# Patient Record
Sex: Male | Born: 1935
Health system: Southern US, Community
[De-identification: ages and names within clinical notes are randomized; demographics above are authoritative.]

## PROBLEM LIST (undated history)

## (undated) DIAGNOSIS — R040 Epistaxis: Secondary | ICD-10-CM

## (undated) DIAGNOSIS — S2239XA Fracture of one rib, unspecified side, initial encounter for closed fracture: Secondary | ICD-10-CM

## (undated) DIAGNOSIS — I712 Thoracic aortic aneurysm, without rupture, unspecified: Secondary | ICD-10-CM

## (undated) DIAGNOSIS — Z8701 Personal history of pneumonia (recurrent): Secondary | ICD-10-CM

## (undated) DIAGNOSIS — E785 Hyperlipidemia, unspecified: Secondary | ICD-10-CM

## (undated) DIAGNOSIS — I4821 Permanent atrial fibrillation: Secondary | ICD-10-CM

## (undated) DIAGNOSIS — R270 Ataxia, unspecified: Secondary | ICD-10-CM

## (undated) DIAGNOSIS — E119 Type 2 diabetes mellitus without complications: Secondary | ICD-10-CM

## (undated) DIAGNOSIS — S2249XA Multiple fractures of ribs, unspecified side, initial encounter for closed fracture: Secondary | ICD-10-CM

## (undated) DIAGNOSIS — I350 Nonrheumatic aortic (valve) stenosis: Secondary | ICD-10-CM

## (undated) DIAGNOSIS — N200 Calculus of kidney: Secondary | ICD-10-CM

## (undated) DIAGNOSIS — N390 Urinary tract infection, site not specified: Secondary | ICD-10-CM

## (undated) DIAGNOSIS — N400 Enlarged prostate without lower urinary tract symptoms: Secondary | ICD-10-CM

## (undated) DIAGNOSIS — I10 Essential (primary) hypertension: Secondary | ICD-10-CM

## (undated) DIAGNOSIS — Z955 Presence of coronary angioplasty implant and graft: Secondary | ICD-10-CM

## (undated) DIAGNOSIS — K219 Gastro-esophageal reflux disease without esophagitis: Secondary | ICD-10-CM

## (undated) DIAGNOSIS — N179 Acute kidney failure, unspecified: Secondary | ICD-10-CM

## (undated) DIAGNOSIS — Z794 Long term (current) use of insulin: Secondary | ICD-10-CM

## (undated) DIAGNOSIS — I2510 Atherosclerotic heart disease of native coronary artery without angina pectoris: Secondary | ICD-10-CM

## (undated) DIAGNOSIS — I5032 Chronic diastolic (congestive) heart failure: Secondary | ICD-10-CM

## (undated) DIAGNOSIS — M1990 Unspecified osteoarthritis, unspecified site: Secondary | ICD-10-CM

## (undated) DIAGNOSIS — I4891 Unspecified atrial fibrillation: Secondary | ICD-10-CM

## (undated) HISTORY — DX: Personal history of pneumonia (recurrent): Z87.01

## (undated) HISTORY — DX: Urinary tract infection, site not specified: N39.0

## (undated) HISTORY — DX: Type 2 diabetes mellitus without complications: E11.9

## (undated) HISTORY — PX: CATARACT EXTRACTION W/ INTRAOCULAR LENS  IMPLANT, BILATERAL: SHX1307

## (undated) HISTORY — DX: Hyperlipidemia, unspecified: E78.5

## (undated) HISTORY — DX: Essential (primary) hypertension: I10

## (undated) HISTORY — DX: Benign prostatic hyperplasia without lower urinary tract symptoms: N40.0

## (undated) HISTORY — DX: Epistaxis: R04.0

## (undated) HISTORY — DX: Fracture of one rib, unspecified side, initial encounter for closed fracture: S22.39XA

## (undated) HISTORY — DX: Acute kidney failure, unspecified: N17.9

## (undated) HISTORY — DX: Unspecified osteoarthritis, unspecified site: M19.90

## (undated) HISTORY — DX: Nonrheumatic aortic (valve) stenosis: I35.0

## (undated) HISTORY — DX: Presence of coronary angioplasty implant and graft: Z95.5

## (undated) HISTORY — DX: Unspecified atrial fibrillation: I48.91

## (undated) HISTORY — DX: Multiple fractures of ribs, unspecified side, initial encounter for closed fracture: S22.49XA

## (undated) HISTORY — DX: Atherosclerotic heart disease of native coronary artery without angina pectoris: I25.10

## (undated) HISTORY — PX: TONSILLECTOMY: SUR1361

## (undated) HISTORY — DX: Calculus of kidney: N20.0

## (undated) HISTORY — PX: JOINT REPLACEMENT: SHX530

## (undated) HISTORY — DX: Long term (current) use of insulin: Z79.4

---

## 1987-03-02 HISTORY — PX: CORONARY ARTERY BYPASS GRAFT: SHX141

## 1987-03-02 HISTORY — PX: CARDIAC CATHETERIZATION: SHX172

## 1998-03-01 HISTORY — PX: TOTAL HIP ARTHROPLASTY: SHX124

## 2004-12-04 ENCOUNTER — Encounter: Admission: RE | Admit: 2004-12-04 | Discharge: 2004-12-04 | Payer: Self-pay | Admitting: Cardiology

## 2008-05-11 ENCOUNTER — Emergency Department (HOSPITAL_COMMUNITY): Admission: EM | Admit: 2008-05-11 | Discharge: 2008-05-11 | Payer: Self-pay | Admitting: Emergency Medicine

## 2008-05-18 ENCOUNTER — Emergency Department (HOSPITAL_COMMUNITY): Admission: EM | Admit: 2008-05-18 | Discharge: 2008-05-19 | Payer: Self-pay | Admitting: Emergency Medicine

## 2008-08-27 DIAGNOSIS — N029 Recurrent and persistent hematuria with unspecified morphologic changes: Secondary | ICD-10-CM | POA: Insufficient documentation

## 2008-08-27 DIAGNOSIS — R319 Hematuria, unspecified: Secondary | ICD-10-CM | POA: Insufficient documentation

## 2009-06-05 ENCOUNTER — Emergency Department (HOSPITAL_COMMUNITY): Admission: EM | Admit: 2009-06-05 | Discharge: 2009-06-05 | Payer: Self-pay | Admitting: Emergency Medicine

## 2009-10-31 ENCOUNTER — Emergency Department (HOSPITAL_COMMUNITY): Admission: EM | Admit: 2009-10-31 | Discharge: 2009-10-31 | Payer: Self-pay | Admitting: Emergency Medicine

## 2009-11-02 ENCOUNTER — Inpatient Hospital Stay (HOSPITAL_COMMUNITY): Admission: EM | Admit: 2009-11-02 | Discharge: 2009-11-05 | Payer: Self-pay | Admitting: Emergency Medicine

## 2009-11-06 ENCOUNTER — Emergency Department (HOSPITAL_COMMUNITY): Admission: EM | Admit: 2009-11-06 | Discharge: 2009-11-06 | Payer: Self-pay | Admitting: Emergency Medicine

## 2009-11-07 ENCOUNTER — Inpatient Hospital Stay (HOSPITAL_COMMUNITY): Admission: EM | Admit: 2009-11-07 | Discharge: 2009-11-10 | Payer: Self-pay | Admitting: Emergency Medicine

## 2009-11-08 ENCOUNTER — Ambulatory Visit: Payer: Self-pay | Admitting: Vascular Surgery

## 2010-01-15 DIAGNOSIS — E119 Type 2 diabetes mellitus without complications: Secondary | ICD-10-CM | POA: Insufficient documentation

## 2010-01-15 DIAGNOSIS — I2510 Atherosclerotic heart disease of native coronary artery without angina pectoris: Secondary | ICD-10-CM | POA: Insufficient documentation

## 2010-04-21 ENCOUNTER — Telehealth (INDEPENDENT_AMBULATORY_CARE_PROVIDER_SITE_OTHER): Payer: Self-pay | Admitting: *Deleted

## 2010-04-22 ENCOUNTER — Ambulatory Visit (HOSPITAL_COMMUNITY): Payer: Medicare Other | Attending: Cardiology

## 2010-04-22 ENCOUNTER — Encounter: Payer: Self-pay | Admitting: Cardiovascular Disease

## 2010-04-22 DIAGNOSIS — R079 Chest pain, unspecified: Secondary | ICD-10-CM | POA: Insufficient documentation

## 2010-04-22 DIAGNOSIS — R0602 Shortness of breath: Secondary | ICD-10-CM

## 2010-04-22 DIAGNOSIS — I2510 Atherosclerotic heart disease of native coronary artery without angina pectoris: Secondary | ICD-10-CM

## 2010-04-28 NOTE — Assessment & Plan Note (Signed)
Summary: Cardiology Nuclear Testing  Nuclear Med Background Indications for Stress Test: Evaluation for Ischemia, Graft Patency   History: CABG, Echo, Heart Catheterization, Myocardial Perfusion Study  History Comments: '90 CABG x2 '03 Heart Cath 10/08 MPS: EF=61% 5/10 Echo: mild AS  Symptoms: DOE, Fatigue with Exertion    Nuclear Pre-Procedure Cardiac Risk Factors: History of Smoking, Hypertension, IDDM Type 2, Lipids Caffeine/Decaff Intake: None NPO After: 7:00 PM Lungs: clear IV 0.9% NS with Angio Cath: 22g     IV Site: R Hand IV Started by: Bonnita Levan, RN Chest Size (in) 50     Height (in): 66 Weight (lb): 225 BMI: 36.45  Nuclear Med Study 1 or 2 day study:  1 day     Stress Test Type:  Treadmill/Lexiscan Reading MD:  Marca Ancona, MD     Referring MD:  P.Jordan Resting Radionuclide:  Technetium 75m Tetrofosmin     Resting Radionuclide Dose:  11 mCi  Stress Radionuclide:  Technetium 78m Tetrofosmin     Stress Radionuclide Dose:  33 mCi   Stress Protocol Exercise Time (min):  2:00 min     Max HR:  110 bpm Max Systolic BP: 179 mm HgRate Pressure Product:  13086  Lexiscan: 0.4 mg   Stress Test Technologist:  Frederick Peers, EMT-P     Nuclear Technologist:  Doyne Keel, CNMT  Rest Procedure  Myocardial perfusion imaging was performed at rest 45 minutes following the intravenous administration of Technetium 57m Tetrofosmin.  Stress Procedure  The patient received IV Lexiscan 0.4 mg over 15-seconds with concurrent low level exercise and then Technetium 67m Tetrofosmin was injected at 30-seconds while the patient continued walking one more minute.  There were no significant changes with Lexiscan.  Quantitative spect images were obtained after a 45 minute delay.  QPS Raw Data Images:  Normal; no motion artifact; normal heart/lung ratio. Stress Images:  Normal homogeneous uptake in all areas of the myocardium. Rest Images:  Normal homogeneous uptake in all areas of  the myocardium. Subtraction (SDS):  Normal SDS 1 Transient Ischemic Dilatation:  0.88  (Normal <1.22)  Lung/Heart Ratio:  0.32  (Normal <0.45)  Quantitative Gated Spect Images QGS EDV:  107 ml QGS ESV:  39 ml QGS EF:  64 % QGS cine images:  normal  Findings Normal nuclear study      Overall Impression  Exercise Capacity: Lexiscan with no exercise. BP Response: Normal blood pressure response. Clinical Symptoms: Dyspnea ECG Impression: Baseline nonspecific inferolateral T wave changes Overall Impression: Normal stress nuclear study.  Appended Document: Cardiology Nuclear Testing copy sent to Dr. [place]

## 2010-04-28 NOTE — Progress Notes (Signed)
Summary: Nuclear Pre-Procedure  Phone Note Outgoing Call Call back at [facility] Phone [phone]   Call placed by: Stanton Kidney, EMT-P,  April 21, 2010 1:25 PM Action Taken: Phone Call Completed Summary of Call: Left message with information on Myoview Information Sheet (see scanned document for details). Stanton Kidney, EMT-P  April 21, 2010 1:25 PM      Nuclear Med Background Indications for Stress Test: Evaluation for Ischemia, Graft Patency   History: CABG, Echo, Heart Catheterization, Myocardial Perfusion Study  History Comments: '90 CABG x2 '03 Heart Cath 10/08 MPS: EF=61% 5/10 Echo: mild AS     Nuclear Pre-Procedure Cardiac Risk Factors: History of Smoking, Hypertension, IDDM Type 2, Lipids

## 2010-05-11 ENCOUNTER — Encounter: Payer: Self-pay | Admitting: Cardiology

## 2010-05-11 DIAGNOSIS — E1169 Type 2 diabetes mellitus with other specified complication: Secondary | ICD-10-CM | POA: Insufficient documentation

## 2010-05-11 DIAGNOSIS — E1121 Type 2 diabetes mellitus with diabetic nephropathy: Secondary | ICD-10-CM | POA: Insufficient documentation

## 2010-05-11 DIAGNOSIS — E782 Mixed hyperlipidemia: Secondary | ICD-10-CM | POA: Insufficient documentation

## 2010-05-11 DIAGNOSIS — E785 Hyperlipidemia, unspecified: Secondary | ICD-10-CM | POA: Insufficient documentation

## 2010-05-11 DIAGNOSIS — I10 Essential (primary) hypertension: Secondary | ICD-10-CM | POA: Insufficient documentation

## 2010-05-11 DIAGNOSIS — Z951 Presence of aortocoronary bypass graft: Secondary | ICD-10-CM | POA: Insufficient documentation

## 2010-05-11 DIAGNOSIS — I152 Hypertension secondary to endocrine disorders: Secondary | ICD-10-CM | POA: Insufficient documentation

## 2010-05-11 DIAGNOSIS — E119 Type 2 diabetes mellitus without complications: Secondary | ICD-10-CM | POA: Insufficient documentation

## 2010-05-14 LAB — URINE CULTURE
Colony Count: >=100000
Colony Count: NO GROWTH
Colony Count: NO GROWTH
Culture  Setup Time: 201109051316
Culture  Setup Time: 201109090836
Culture  Setup Time: 201109091847
Culture: NO GROWTH
Culture: NO GROWTH
Special Requests: NEGATIVE
Special Requests: NEGATIVE

## 2010-05-14 LAB — COMPREHENSIVE METABOLIC PANEL
ALT: 24 U/L (ref 0–53)
ALT: 25 U/L (ref 0–53)
AST: 20 U/L (ref 0–37)
AST: 29 U/L (ref 0–37)
Albumin: 2.9 g/dL — ABNORMAL LOW (ref 3.5–5.2)
Albumin: 3.7 g/dL (ref 3.5–5.2)
Alkaline Phosphatase: 59 U/L (ref 39–117)
Alkaline Phosphatase: 78 U/L (ref 39–117)
BUN: 21 mg/dL (ref 6–23)
BUN: 48 mg/dL — ABNORMAL HIGH (ref 6–23)
CO2: 22 mEq/L (ref 19–32)
CO2: 28 mEq/L (ref 19–32)
Calcium: 8.5 mg/dL (ref 8.4–10.5)
Calcium: 8.9 mg/dL (ref 8.4–10.5)
Chloride: 103 mEq/L (ref 96–112)
Chloride: 90 mEq/L — ABNORMAL LOW (ref 96–112)
Creatinine, Ser: 0.96 mg/dL (ref 0.4–1.5)
Creatinine, Ser: 2.57 mg/dL — ABNORMAL HIGH (ref 0.4–1.5)
GFR calc Af Amer: 29 mL/min — ABNORMAL LOW (ref >60)
GFR calc Af Amer: >60 mL/min (ref >60)
GFR calc non Af Amer: 24 mL/min — ABNORMAL LOW (ref >60)
GFR calc non Af Amer: >60 mL/min (ref >60)
Glucose, Bld: 252 mg/dL — ABNORMAL HIGH (ref 70–99)
Glucose, Bld: 354 mg/dL — ABNORMAL HIGH (ref 70–99)
Potassium: 4.4 mEq/L (ref 3.5–5.1)
Potassium: 4.4 mEq/L (ref 3.5–5.1)
Sodium: 125 mEq/L — ABNORMAL LOW (ref 135–145)
Sodium: 137 mEq/L (ref 135–145)
Total Bilirubin: 1 mg/dL (ref 0.3–1.2)
Total Bilirubin: 1.7 mg/dL — ABNORMAL HIGH (ref 0.3–1.2)
Total Protein: 6.1 g/dL (ref 6.0–8.3)
Total Protein: 7.4 g/dL (ref 6.0–8.3)

## 2010-05-14 LAB — BASIC METABOLIC PANEL
BUN: 47 mg/dL — ABNORMAL HIGH (ref 6–23)
BUN: 8 mg/dL (ref 6–23)
CO2: 24 mEq/L (ref 19–32)
CO2: 26 mEq/L (ref 19–32)
Calcium: 8.6 mg/dL (ref 8.4–10.5)
Calcium: 8.8 mg/dL (ref 8.4–10.5)
Chloride: 107 mEq/L (ref 96–112)
Chloride: 94 mEq/L — ABNORMAL LOW (ref 96–112)
Creatinine, Ser: 0.76 mg/dL (ref 0.4–1.5)
Creatinine, Ser: 2.13 mg/dL — ABNORMAL HIGH (ref 0.4–1.5)
GFR calc Af Amer: 36 mL/min — ABNORMAL LOW (ref >60)
GFR calc Af Amer: >60 mL/min (ref >60)
GFR calc non Af Amer: 30 mL/min — ABNORMAL LOW (ref >60)
GFR calc non Af Amer: >60 mL/min (ref >60)
Glucose, Bld: 135 mg/dL — ABNORMAL HIGH (ref 70–99)
Glucose, Bld: 261 mg/dL — ABNORMAL HIGH (ref 70–99)
Potassium: 3.8 mEq/L (ref 3.5–5.1)
Potassium: 4.3 mEq/L (ref 3.5–5.1)
Sodium: 130 mEq/L — ABNORMAL LOW (ref 135–145)
Sodium: 141 mEq/L (ref 135–145)

## 2010-05-14 LAB — GLUCOSE, CAPILLARY
Glucose-Capillary: 125 mg/dL — ABNORMAL HIGH (ref 70–99)
Glucose-Capillary: 125 mg/dL — ABNORMAL HIGH (ref 70–99)
Glucose-Capillary: 173 mg/dL — ABNORMAL HIGH (ref 70–99)
Glucose-Capillary: 174 mg/dL — ABNORMAL HIGH (ref 70–99)
Glucose-Capillary: 196 mg/dL — ABNORMAL HIGH (ref 70–99)
Glucose-Capillary: 204 mg/dL — ABNORMAL HIGH (ref 70–99)
Glucose-Capillary: 216 mg/dL — ABNORMAL HIGH (ref 70–99)
Glucose-Capillary: 217 mg/dL — ABNORMAL HIGH (ref 70–99)
Glucose-Capillary: 220 mg/dL — ABNORMAL HIGH (ref 70–99)
Glucose-Capillary: 225 mg/dL — ABNORMAL HIGH (ref 70–99)
Glucose-Capillary: 233 mg/dL — ABNORMAL HIGH (ref 70–99)
Glucose-Capillary: 234 mg/dL — ABNORMAL HIGH (ref 70–99)
Glucose-Capillary: 237 mg/dL — ABNORMAL HIGH (ref 70–99)
Glucose-Capillary: 237 mg/dL — ABNORMAL HIGH (ref 70–99)
Glucose-Capillary: 241 mg/dL — ABNORMAL HIGH (ref 70–99)
Glucose-Capillary: 245 mg/dL — ABNORMAL HIGH (ref 70–99)
Glucose-Capillary: 246 mg/dL — ABNORMAL HIGH (ref 70–99)
Glucose-Capillary: 248 mg/dL — ABNORMAL HIGH (ref 70–99)
Glucose-Capillary: 249 mg/dL — ABNORMAL HIGH (ref 70–99)
Glucose-Capillary: 253 mg/dL — ABNORMAL HIGH (ref 70–99)
Glucose-Capillary: 257 mg/dL — ABNORMAL HIGH (ref 70–99)
Glucose-Capillary: 264 mg/dL — ABNORMAL HIGH (ref 70–99)
Glucose-Capillary: 276 mg/dL — ABNORMAL HIGH (ref 70–99)
Glucose-Capillary: 277 mg/dL — ABNORMAL HIGH (ref 70–99)
Glucose-Capillary: 283 mg/dL — ABNORMAL HIGH (ref 70–99)
Glucose-Capillary: 293 mg/dL — ABNORMAL HIGH (ref 70–99)
Glucose-Capillary: 304 mg/dL — ABNORMAL HIGH (ref 70–99)
Glucose-Capillary: 329 mg/dL — ABNORMAL HIGH (ref 70–99)
Glucose-Capillary: 372 mg/dL — ABNORMAL HIGH (ref 70–99)
Glucose-Capillary: 377 mg/dL — ABNORMAL HIGH (ref 70–99)
Glucose-Capillary: 425 mg/dL — ABNORMAL HIGH (ref 70–99)

## 2010-05-14 LAB — CARDIAC PANEL(CRET KIN+CKTOT+MB+TROPI)
CK, MB: 1.1 ng/mL (ref 0.3–4.0)
CK, MB: 1.1 ng/mL (ref 0.3–4.0)
Relative Index: INVALID (ref 0.0–2.5)
Relative Index: INVALID (ref 0.0–2.5)
Total CK: 73 U/L (ref 7–232)
Total CK: 75 U/L (ref 7–232)
Troponin I: 0.02 ng/mL (ref 0.00–0.06)
Troponin I: 0.03 ng/mL (ref 0.00–0.06)

## 2010-05-14 LAB — URINALYSIS, ROUTINE W REFLEX MICROSCOPIC
Bilirubin Urine: NEGATIVE
Bilirubin Urine: NEGATIVE
Glucose, UA: 250 mg/dL — AB
Glucose, UA: NEGATIVE mg/dL
Hgb urine dipstick: NEGATIVE
Ketones, ur: NEGATIVE mg/dL
Ketones, ur: NEGATIVE mg/dL
Nitrite: NEGATIVE
Nitrite: NEGATIVE
Protein, ur: 100 mg/dL — AB
Protein, ur: NEGATIVE mg/dL
Specific Gravity, Urine: 1.01 (ref 1.005–1.030)
Specific Gravity, Urine: 1.018 (ref 1.005–1.030)
Urobilinogen, UA: 0.2 mg/dL (ref 0.0–1.0)
Urobilinogen, UA: 0.2 mg/dL (ref 0.0–1.0)
pH: 5.5 (ref 5.0–8.0)
pH: 5.5 (ref 5.0–8.0)

## 2010-05-14 LAB — URINALYSIS, MICROSCOPIC ONLY
Bilirubin Urine: NEGATIVE
Glucose, UA: >1000 mg/dL — AB
Ketones, ur: NEGATIVE mg/dL
Leukocytes, UA: NEGATIVE
Nitrite: NEGATIVE
Protein, ur: NEGATIVE mg/dL
Specific Gravity, Urine: 1.045 — ABNORMAL HIGH (ref 1.005–1.030)
Urobilinogen, UA: 0.2 mg/dL (ref 0.0–1.0)
pH: 7 (ref 5.0–8.0)

## 2010-05-14 LAB — RETICULOCYTES
RBC.: 3.76 MIL/uL — ABNORMAL LOW (ref 4.22–5.81)
Retic Count, Absolute: 30.1 10*3/uL (ref 19.0–186.0)
Retic Ct Pct: 0.8 % (ref 0.4–3.1)

## 2010-05-14 LAB — CBC
HCT: 31.1 % — ABNORMAL LOW (ref 39.0–52.0)
HCT: 31.3 % — ABNORMAL LOW (ref 39.0–52.0)
HCT: 32.5 % — ABNORMAL LOW (ref 39.0–52.0)
HCT: 35.5 % — ABNORMAL LOW (ref 39.0–52.0)
Hemoglobin: 10.8 g/dL — ABNORMAL LOW (ref 13.0–17.0)
Hemoglobin: 10.9 g/dL — ABNORMAL LOW (ref 13.0–17.0)
Hemoglobin: 11.1 g/dL — ABNORMAL LOW (ref 13.0–17.0)
Hemoglobin: 12.7 g/dL — ABNORMAL LOW (ref 13.0–17.0)
MCH: 29 pg (ref 26.0–34.0)
MCH: 29.1 pg (ref 26.0–34.0)
MCH: 29.2 pg (ref 26.0–34.0)
MCH: 29.5 pg (ref 26.0–34.0)
MCHC: 34.2 g/dL (ref 30.0–36.0)
MCHC: 34.5 g/dL (ref 30.0–36.0)
MCHC: 35 g/dL (ref 30.0–36.0)
MCHC: 35.8 g/dL (ref 30.0–36.0)
MCV: 81.6 fL (ref 78.0–100.0)
MCV: 82.7 fL (ref 78.0–100.0)
MCV: 85.1 fL (ref 78.0–100.0)
MCV: 85.5 fL (ref 78.0–100.0)
Platelets: 228 10*3/uL (ref 150–400)
Platelets: 237 10*3/uL (ref 150–400)
Platelets: 238 10*3/uL (ref 150–400)
Platelets: 277 10*3/uL (ref 150–400)
RBC: 3.66 MIL/uL — ABNORMAL LOW (ref 4.22–5.81)
RBC: 3.76 MIL/uL — ABNORMAL LOW (ref 4.22–5.81)
RBC: 3.82 MIL/uL — ABNORMAL LOW (ref 4.22–5.81)
RBC: 4.35 MIL/uL (ref 4.22–5.81)
RDW: 12.9 % (ref 11.5–15.5)
RDW: 13 % (ref 11.5–15.5)
RDW: 13.1 % (ref 11.5–15.5)
RDW: 13.2 % (ref 11.5–15.5)
WBC: 10.7 10*3/uL — ABNORMAL HIGH (ref 4.0–10.5)
WBC: 11.5 10*3/uL — ABNORMAL HIGH (ref 4.0–10.5)
WBC: 14.1 10*3/uL — ABNORMAL HIGH (ref 4.0–10.5)
WBC: 17.4 10*3/uL — ABNORMAL HIGH (ref 4.0–10.5)

## 2010-05-14 LAB — POCT CARDIAC MARKERS
CKMB, poc: <1 ng/mL — ABNORMAL LOW (ref 1.0–8.0)
Myoglobin, poc: 70.9 ng/mL (ref 12–200)
Troponin i, poc: <0.05 ng/mL (ref 0.00–0.09)

## 2010-05-14 LAB — DIFFERENTIAL
Basophils Absolute: 0 10*3/uL (ref 0.0–0.1)
Basophils Relative: 0 % (ref 0–1)
Eosinophils Absolute: 0 10*3/uL (ref 0.0–0.7)
Eosinophils Relative: 0 % (ref 0–5)
Lymphocytes Relative: 5 % — ABNORMAL LOW (ref 12–46)
Lymphs Abs: 0.9 10*3/uL (ref 0.7–4.0)
Monocytes Absolute: 1.1 10*3/uL — ABNORMAL HIGH (ref 0.1–1.0)
Monocytes Relative: 6 % (ref 3–12)
Neutro Abs: 15.4 10*3/uL — ABNORMAL HIGH (ref 1.7–7.7)
Neutrophils Relative %: 88 % — ABNORMAL HIGH (ref 43–77)

## 2010-05-14 LAB — HEMOGLOBIN A1C
Hgb A1c MFr Bld: 7.2 % — ABNORMAL HIGH (ref <5.7)
Mean Plasma Glucose: 160 mg/dL — ABNORMAL HIGH (ref <117)

## 2010-05-14 LAB — POCT I-STAT, CHEM 8
BUN: 12 mg/dL (ref 6–23)
Calcium, Ion: 1.16 mmol/L (ref 1.12–1.32)
Chloride: 101 mEq/L (ref 96–112)
Creatinine, Ser: 0.9 mg/dL (ref 0.4–1.5)
Glucose, Bld: 226 mg/dL — ABNORMAL HIGH (ref 70–99)
HCT: 43 % (ref 39.0–52.0)
Hemoglobin: 14.6 g/dL (ref 13.0–17.0)
Potassium: 4.2 mEq/L (ref 3.5–5.1)
Sodium: 138 mEq/L (ref 135–145)
TCO2: 28 mmol/L (ref 0–100)

## 2010-05-14 LAB — CK TOTAL AND CKMB (NOT AT ARMC)
CK, MB: 1.1 ng/mL (ref 0.3–4.0)
Relative Index: INVALID (ref 0.0–2.5)
Total CK: 72 U/L (ref 7–232)

## 2010-05-14 LAB — IRON AND TIBC
Iron: 25 ug/dL — ABNORMAL LOW (ref 42–135)
Saturation Ratios: 12 % — ABNORMAL LOW (ref 20–55)
TIBC: 216 ug/dL (ref 215–435)
UIBC: 191 ug/dL

## 2010-05-14 LAB — VITAMIN B12: Vitamin B-12: 337 pg/mL (ref 211–911)

## 2010-05-14 LAB — LIPASE, BLOOD: Lipase: 21 U/L (ref 11–59)

## 2010-05-14 LAB — MAGNESIUM: Magnesium: 2.2 mg/dL (ref 1.5–2.5)

## 2010-05-14 LAB — URINE MICROSCOPIC-ADD ON

## 2010-05-14 LAB — PSA: PSA: 106.83 ng/mL — ABNORMAL HIGH (ref 0.10–4.00)

## 2010-05-14 LAB — TROPONIN I: Troponin I: 0.01 ng/mL (ref 0.00–0.06)

## 2010-05-14 LAB — FOLATE: Folate: 16.4 ng/mL

## 2010-05-14 LAB — FERRITIN: Ferritin: 369 ng/mL — ABNORMAL HIGH (ref 22–322)

## 2010-05-20 LAB — CBC
HCT: 41.5 % (ref 39.0–52.0)
Hemoglobin: 14.2 g/dL (ref 13.0–17.0)
MCHC: 34.2 g/dL (ref 30.0–36.0)
MCV: 88 fL (ref 78.0–100.0)
Platelets: 251 10*3/uL (ref 150–400)
RBC: 4.72 MIL/uL (ref 4.22–5.81)
RDW: 13.3 % (ref 11.5–15.5)
WBC: 10.4 10*3/uL (ref 4.0–10.5)

## 2010-05-20 LAB — COMPREHENSIVE METABOLIC PANEL
ALT: 24 U/L (ref 0–53)
AST: 29 U/L (ref 0–37)
Albumin: 4 g/dL (ref 3.5–5.2)
Alkaline Phosphatase: 70 U/L (ref 39–117)
BUN: 5 mg/dL — ABNORMAL LOW (ref 6–23)
CO2: 29 mEq/L (ref 19–32)
Calcium: 9.6 mg/dL (ref 8.4–10.5)
Chloride: 104 mEq/L (ref 96–112)
Creatinine, Ser: 0.9 mg/dL (ref 0.4–1.5)
GFR calc Af Amer: >60 mL/min (ref >60)
GFR calc non Af Amer: >60 mL/min (ref >60)
Glucose, Bld: 113 mg/dL — ABNORMAL HIGH (ref 70–99)
Potassium: 4.1 mEq/L (ref 3.5–5.1)
Sodium: 144 mEq/L (ref 135–145)
Total Bilirubin: 1.1 mg/dL (ref 0.3–1.2)
Total Protein: 7.1 g/dL (ref 6.0–8.3)

## 2010-05-20 LAB — DIFFERENTIAL
Basophils Absolute: 0.1 10*3/uL (ref 0.0–0.1)
Basophils Relative: 1 % (ref 0–1)
Eosinophils Absolute: 0.3 10*3/uL (ref 0.0–0.7)
Eosinophils Relative: 3 % (ref 0–5)
Lymphocytes Relative: 19 % (ref 12–46)
Lymphs Abs: 2 10*3/uL (ref 0.7–4.0)
Monocytes Absolute: 0.6 10*3/uL (ref 0.1–1.0)
Monocytes Relative: 6 % (ref 3–12)
Neutro Abs: 7.4 10*3/uL (ref 1.7–7.7)
Neutrophils Relative %: 71 % (ref 43–77)

## 2010-05-20 LAB — POCT CARDIAC MARKERS
CKMB, poc: 2.7 ng/mL (ref 1.0–8.0)
Myoglobin, poc: 235 ng/mL (ref 12–200)
Troponin i, poc: <0.05 ng/mL (ref 0.00–0.09)

## 2010-05-20 LAB — D-DIMER, QUANTITATIVE: D-Dimer, Quant: 0.44 ug/mL-FEU (ref 0.00–0.48)

## 2010-05-20 LAB — BRAIN NATRIURETIC PEPTIDE: Pro B Natriuretic peptide (BNP): 151 pg/mL — ABNORMAL HIGH (ref 0.0–100.0)

## 2010-06-02 ENCOUNTER — Ambulatory Visit: Payer: Medicare Other | Admitting: Cardiology

## 2010-06-11 LAB — POCT I-STAT, CHEM 8
BUN: 19 mg/dL (ref 6–23)
Calcium, Ion: 1.16 mmol/L (ref 1.12–1.32)
Chloride: 101 mEq/L (ref 96–112)
Creatinine, Ser: 1.3 mg/dL (ref 0.4–1.5)
Glucose, Bld: 362 mg/dL — ABNORMAL HIGH (ref 70–99)
HCT: 42 % (ref 39.0–52.0)
Hemoglobin: 14.3 g/dL (ref 13.0–17.0)
Potassium: 4.3 mEq/L (ref 3.5–5.1)
Sodium: 139 mEq/L (ref 135–145)
TCO2: 29 mmol/L (ref 0–100)

## 2010-06-11 LAB — CBC
HCT: 38.4 % — ABNORMAL LOW (ref 39.0–52.0)
Hemoglobin: 13.5 g/dL (ref 13.0–17.0)
MCHC: 35.2 g/dL (ref 30.0–36.0)
MCV: 87.3 fL (ref 78.0–100.0)
Platelets: 275 10*3/uL (ref 150–400)
RBC: 4.4 MIL/uL (ref 4.22–5.81)
RDW: 13.4 % (ref 11.5–15.5)
WBC: 12.3 10*3/uL — ABNORMAL HIGH (ref 4.0–10.5)

## 2010-06-11 LAB — DIFFERENTIAL
Basophils Absolute: 0.1 10*3/uL (ref 0.0–0.1)
Basophils Relative: 1 % (ref 0–1)
Eosinophils Absolute: 0.3 10*3/uL (ref 0.0–0.7)
Eosinophils Relative: 3 % (ref 0–5)
Lymphocytes Relative: 15 % (ref 12–46)
Lymphs Abs: 1.8 10*3/uL (ref 0.7–4.0)
Monocytes Absolute: 0.9 10*3/uL (ref 0.1–1.0)
Monocytes Relative: 7 % (ref 3–12)
Neutro Abs: 9.2 10*3/uL — ABNORMAL HIGH (ref 1.7–7.7)
Neutrophils Relative %: 75 % (ref 43–77)

## 2010-06-11 LAB — SEDIMENTATION RATE: Sed Rate: 35 mm/hr — ABNORMAL HIGH (ref 0–16)

## 2010-06-11 LAB — URIC ACID: Uric Acid, Serum: 5.1 mg/dL (ref 4.0–7.8)

## 2010-06-22 ENCOUNTER — Ambulatory Visit (INDEPENDENT_AMBULATORY_CARE_PROVIDER_SITE_OTHER): Payer: Medicare Other | Admitting: Cardiology

## 2010-06-22 ENCOUNTER — Encounter: Payer: Self-pay | Admitting: Cardiology

## 2010-06-22 DIAGNOSIS — I2510 Atherosclerotic heart disease of native coronary artery without angina pectoris: Secondary | ICD-10-CM

## 2010-06-22 DIAGNOSIS — I10 Essential (primary) hypertension: Secondary | ICD-10-CM

## 2010-06-22 DIAGNOSIS — I359 Nonrheumatic aortic valve disorder, unspecified: Secondary | ICD-10-CM

## 2010-06-22 DIAGNOSIS — I350 Nonrheumatic aortic (valve) stenosis: Secondary | ICD-10-CM

## 2010-06-22 DIAGNOSIS — E785 Hyperlipidemia, unspecified: Secondary | ICD-10-CM

## 2010-06-22 NOTE — Assessment & Plan Note (Signed)
Recent stress test was unremarkable. We will continue risk factor modification and regular aerobic exercise.

## 2010-06-22 NOTE — Progress Notes (Signed)
[name] Date of Birth: 1928-08-12   History of Present Illness: Mr. Square is seen for followup today. He states he has been doing very well. He denies any chest pain, shortness of breath, or palpitations. He is actually offended that on his last stress test he wasn't allowed to walk on the treadmill but instead was given Lexiscan. He continues to exercise regularly.  Current Outpatient Prescriptions on File Prior to Visit  Medication Sig Dispense Refill  . amLODipine (NORVASC) 5 MG tablet Take 5 mg by mouth 2 (two) times daily.        [name] aspirin 325 MG tablet Take 325 mg by mouth daily.        [name] atenolol (TENORMIN) 25 MG tablet Take 25 mg by mouth daily.        . diclofenac sodium (VOLTAREN) 1 % GEL Apply topically.        [name] diltiazem (CARDIZEM CD) 180 MG 24 hr capsule Take 180 mg by mouth 2 (two) times daily.        [name] dutasteride (AVODART) 0.5 MG capsule Take 0.5 mg by mouth daily.        . furosemide (LASIX) 20 MG tablet Take 20 mg by mouth 2 (two) times daily.        . insulin glargine (LANTUS) 100 UNIT/ML injection Inject 40 Units into the skin daily.        [name] lisinopril (PRINIVIL,ZESTRIL) 40 MG tablet Take 40 mg by mouth 2 (two) times daily.       . meclizine (ANTIVERT) 25 MG tablet Take 25 mg by mouth as needed.        . metFORMIN (GLUCOPHAGE) 500 MG tablet Take 500 mg by mouth 2 (two) times daily with a meal. 2 TABLETS IN THE MORNING, 2 tablets in the evening       . Multiple Vitamin (MULTIVITAMIN) tablet Take 1 tablet by mouth daily.        . simvastatin (ZOCOR) 40 MG tablet Take 40 mg by mouth 2 (two) times daily.        . Tamsulosin HCl (FLOMAX) 0.4 MG CAPS Take 0.4 mg by mouth daily.          Allergies  Allergen Reactions  . Amoxicillin   . Cefadroxil   . Ciprofloxacin   . Erythromycin     Past Medical History  Diagnosis Date  . CAD (coronary artery disease)     CABG IN 1990  . Mild aortic stenosis   . HTN (hypertension)   . Diabetes mellitus type 2,  insulin dependent   . Hyperlipemia   . Bladder outlet obstruction   . BPH (benign prostatic hyperplasia)     Past Surgical History  Procedure Date  . Coronary artery bypass graft 1990  . Total hip arthroplasty 2000    RIGHT HIP  . Cardiac catheterization 2003  . Carpel tunnel     right  . Cataract extraction, bilateral     History  Smoking status  . Former Smoker  . Types: Cigarettes  . Quit date: 05/11/1958  Smokeless tobacco  . Not on file    History  Alcohol Use No    Family History  Problem Relation Age of Onset  . Brain cancer Father   . Throat cancer Brother     Review of Systems: The review of systems is positive for arthritis in his hands for which he has received a recent injection.  All other systems were reviewed and are negative.  Physical Exam: BP 140/70  Pulse 60  Wt 226 lb (102.513 kg) He is a pleasant elderly white male who is obese. HEENT exam is unremarkable. Sclera are clear. Oropharynx is clear. Neck is supple no JVD, adenopathy, thyromegaly, or bruits. Lungs are clear. Cardiac exam reveals a grade 1-2/6 systolic ejection murmur right upper sternal border. Abdomen is obese, soft, nontender. He has no edema. Pedal pulses are palpable. He is alert and oriented x3. Cranial nerves II through XII are intact. LABORATORY DATA: Dated January 15, 2010 his total cholesterol is 140, triglycerides 95, HDL 45, and LDL 76.  Assessment / Plan:

## 2010-06-22 NOTE — Patient Instructions (Signed)
Continue current medications.  Continue exercise.  Will will get a copy of your lab work from Dr. Duanne Guess.  We will see you back in 6 months for your next office visit.

## 2010-06-22 NOTE — Assessment & Plan Note (Signed)
Lipids are under excellent control. We'll continue with current medical treatment.

## 2010-06-22 NOTE — Assessment & Plan Note (Signed)
Well controlled Continue current therapy 

## 2010-06-22 NOTE — Assessment & Plan Note (Signed)
Last echocardiogram in May of 2010 showed mild stenosis. He is asymptomatic. We will continue to monitor.

## 2010-08-10 ENCOUNTER — Encounter: Payer: Self-pay | Admitting: Cardiology

## 2010-08-30 HISTORY — PX: CARPAL TUNNEL RELEASE: SHX101

## 2010-11-17 ENCOUNTER — Emergency Department (HOSPITAL_COMMUNITY)
Admission: EM | Admit: 2010-11-17 | Discharge: 2010-11-17 | Disposition: A | Discharge status: Home / Self Care | Payer: Medicare Other | Attending: Emergency Medicine | Admitting: Emergency Medicine

## 2010-11-17 ENCOUNTER — Emergency Department (HOSPITAL_COMMUNITY): Payer: Medicare Other

## 2010-11-17 DIAGNOSIS — I10 Essential (primary) hypertension: Secondary | ICD-10-CM | POA: Insufficient documentation

## 2010-11-17 DIAGNOSIS — E7800 Pure hypercholesterolemia, unspecified: Secondary | ICD-10-CM | POA: Insufficient documentation

## 2010-11-17 DIAGNOSIS — E119 Type 2 diabetes mellitus without complications: Secondary | ICD-10-CM | POA: Insufficient documentation

## 2010-11-17 DIAGNOSIS — N201 Calculus of ureter: Secondary | ICD-10-CM | POA: Insufficient documentation

## 2010-11-17 DIAGNOSIS — Z79899 Other long term (current) drug therapy: Secondary | ICD-10-CM | POA: Insufficient documentation

## 2010-11-17 DIAGNOSIS — K5900 Constipation, unspecified: Secondary | ICD-10-CM | POA: Insufficient documentation

## 2010-11-17 DIAGNOSIS — R1110 Vomiting, unspecified: Secondary | ICD-10-CM | POA: Insufficient documentation

## 2010-11-19 ENCOUNTER — Telehealth: Payer: Self-pay | Admitting: Cardiology

## 2010-11-20 ENCOUNTER — Ambulatory Visit (HOSPITAL_COMMUNITY): Payer: Medicare Other | Attending: Urology

## 2010-11-20 DIAGNOSIS — R319 Hematuria, unspecified: Secondary | ICD-10-CM | POA: Insufficient documentation

## 2010-11-20 DIAGNOSIS — I252 Old myocardial infarction: Secondary | ICD-10-CM | POA: Insufficient documentation

## 2010-11-20 DIAGNOSIS — I10 Essential (primary) hypertension: Secondary | ICD-10-CM | POA: Insufficient documentation

## 2010-11-20 DIAGNOSIS — R9431 Abnormal electrocardiogram [ECG] [EKG]: Secondary | ICD-10-CM | POA: Insufficient documentation

## 2010-11-20 DIAGNOSIS — E7800 Pure hypercholesterolemia, unspecified: Secondary | ICD-10-CM | POA: Insufficient documentation

## 2010-11-20 DIAGNOSIS — N201 Calculus of ureter: Secondary | ICD-10-CM | POA: Insufficient documentation

## 2010-11-20 DIAGNOSIS — Z79899 Other long term (current) drug therapy: Secondary | ICD-10-CM | POA: Insufficient documentation

## 2010-11-20 DIAGNOSIS — E119 Type 2 diabetes mellitus without complications: Secondary | ICD-10-CM | POA: Insufficient documentation

## 2010-11-23 ENCOUNTER — Ambulatory Visit (HOSPITAL_COMMUNITY)
Admission: RE | Admit: 2010-11-23 | Discharge: 2010-11-23 | Disposition: A | Discharge status: Home / Self Care | Payer: Medicare Other | Source: Ambulatory Visit | Attending: Urology | Admitting: Urology

## 2010-11-23 DIAGNOSIS — N201 Calculus of ureter: Secondary | ICD-10-CM | POA: Insufficient documentation

## 2010-11-23 LAB — GLUCOSE, CAPILLARY: Glucose-Capillary: 241 mg/dL — ABNORMAL HIGH (ref 70–99)

## 2010-11-27 NOTE — Telephone Encounter (Signed)
No notes required for this encounter

## 2010-11-28 ENCOUNTER — Emergency Department (HOSPITAL_COMMUNITY)
Admission: EM | Admit: 2010-11-28 | Discharge: 2010-11-28 | Disposition: A | Discharge status: Home / Self Care | Payer: Medicare Other | Attending: Emergency Medicine | Admitting: Emergency Medicine

## 2010-11-28 ENCOUNTER — Emergency Department (HOSPITAL_COMMUNITY): Payer: Medicare Other

## 2010-11-28 DIAGNOSIS — R112 Nausea with vomiting, unspecified: Secondary | ICD-10-CM | POA: Insufficient documentation

## 2010-11-28 DIAGNOSIS — I10 Essential (primary) hypertension: Secondary | ICD-10-CM | POA: Insufficient documentation

## 2010-11-28 DIAGNOSIS — E119 Type 2 diabetes mellitus without complications: Secondary | ICD-10-CM | POA: Insufficient documentation

## 2010-11-28 DIAGNOSIS — N400 Enlarged prostate without lower urinary tract symptoms: Secondary | ICD-10-CM | POA: Insufficient documentation

## 2010-11-28 DIAGNOSIS — B370 Candidal stomatitis: Secondary | ICD-10-CM | POA: Insufficient documentation

## 2010-11-28 DIAGNOSIS — E7800 Pure hypercholesterolemia, unspecified: Secondary | ICD-10-CM | POA: Insufficient documentation

## 2010-11-28 DIAGNOSIS — Z87442 Personal history of urinary calculi: Secondary | ICD-10-CM | POA: Insufficient documentation

## 2010-11-28 DIAGNOSIS — R109 Unspecified abdominal pain: Secondary | ICD-10-CM | POA: Insufficient documentation

## 2010-11-28 DIAGNOSIS — K8020 Calculus of gallbladder without cholecystitis without obstruction: Secondary | ICD-10-CM | POA: Insufficient documentation

## 2010-11-28 LAB — DIFFERENTIAL
Basophils Absolute: 0.1 10*3/uL (ref 0.0–0.1)
Basophils Relative: 1 % (ref 0–1)
Eosinophils Absolute: 0.6 10*3/uL (ref 0.0–0.7)
Eosinophils Relative: 6 % — ABNORMAL HIGH (ref 0–5)
Lymphocytes Relative: 10 % — ABNORMAL LOW (ref 12–46)
Lymphs Abs: 1 10*3/uL (ref 0.7–4.0)
Monocytes Absolute: 1.1 10*3/uL — ABNORMAL HIGH (ref 0.1–1.0)
Monocytes Relative: 11 % (ref 3–12)
Neutro Abs: 7.6 10*3/uL (ref 1.7–7.7)
Neutrophils Relative %: 72 % (ref 43–77)

## 2010-11-28 LAB — CBC
HCT: 33.7 % — ABNORMAL LOW (ref 39.0–52.0)
Hemoglobin: 11.9 g/dL — ABNORMAL LOW (ref 13.0–17.0)
MCH: 29 pg (ref 26.0–34.0)
MCHC: 35.3 g/dL (ref 30.0–36.0)
MCV: 82 fL (ref 78.0–100.0)
Platelets: 327 10*3/uL (ref 150–400)
RBC: 4.11 MIL/uL — ABNORMAL LOW (ref 4.22–5.81)
RDW: 12.6 % (ref 11.5–15.5)
WBC: 10.5 10*3/uL (ref 4.0–10.5)

## 2010-11-28 LAB — COMPREHENSIVE METABOLIC PANEL
ALT: 25 U/L (ref 0–53)
AST: 21 U/L (ref 0–37)
Albumin: 3.1 g/dL — ABNORMAL LOW (ref 3.5–5.2)
Alkaline Phosphatase: 97 U/L (ref 39–117)
BUN: 13 mg/dL (ref 6–23)
CO2: 26 mEq/L (ref 19–32)
Calcium: 9.5 mg/dL (ref 8.4–10.5)
Chloride: 98 mEq/L (ref 96–112)
Creatinine, Ser: 1.33 mg/dL (ref 0.50–1.35)
GFR calc Af Amer: >60 mL/min (ref >60)
GFR calc non Af Amer: 52 mL/min — ABNORMAL LOW (ref >60)
Glucose, Bld: 130 mg/dL — ABNORMAL HIGH (ref 70–99)
Potassium: 3.5 mEq/L (ref 3.5–5.1)
Sodium: 136 mEq/L (ref 135–145)
Total Bilirubin: 0.4 mg/dL (ref 0.3–1.2)
Total Protein: 7.2 g/dL (ref 6.0–8.3)

## 2010-11-28 LAB — URINE MICROSCOPIC-ADD ON

## 2010-11-28 LAB — URINALYSIS, ROUTINE W REFLEX MICROSCOPIC
Bilirubin Urine: NEGATIVE
Glucose, UA: NEGATIVE mg/dL
Ketones, ur: NEGATIVE mg/dL
Nitrite: NEGATIVE
Protein, ur: NEGATIVE mg/dL
Specific Gravity, Urine: 1.014 (ref 1.005–1.030)
Urobilinogen, UA: 0.2 mg/dL (ref 0.0–1.0)
pH: 5.5 (ref 5.0–8.0)

## 2010-11-28 LAB — HEMOGLOBIN A1C
Hgb A1c MFr Bld: 8.2 % — ABNORMAL HIGH (ref <5.7)
Mean Plasma Glucose: 189 mg/dL — ABNORMAL HIGH (ref <117)

## 2010-12-01 LAB — URINE CULTURE
Colony Count: 75000
Culture  Setup Time: 201209291750

## 2010-12-04 NOTE — Consult Note (Signed)
  NAMEMARQUINN, [name]             ACCOUNT NO.:  [id number]  MEDICAL RECORD NO.:  [id number]  LOCATION:  [facility]                         FACILITY:  [facility]  PHYSICIAN:  Eero Dini I. Patsi Sears, M.D.DATE OF BIRTH:  05/12/1928  DATE OF CONSULTATION: DATE OF DISCHARGE:                                CONSULTATION   SUBJECTIVE:  This 77 year old diabetic male status post lithotripsy on Monday per Dr. Charlann Boxer, has had nausea since Tuesday.  He discussed his case with the office, was felt to have possibly UTI, was treated with Septra, as well as Zofran for nausea.  The patient has had continued pain, nausea, now seen in [facility] Emergency Room with right-sided flank pain.  He has passed multiple particles of the stone.  He has been lethargic.  He has had medical and surgical evaluation in the emergency room.  PAST MEDICAL HISTORY: 1. Diabetes, insulin-dependent. 2. Elevated cholesterol. 3. Hypertension. 4. Kidney stones.  SOCIAL:  There is no history of drug or alcohol abuse.  FAMILY HISTORY:  Otherwise noncontributory.  MEDICATIONS: 1. Ecotrin 325 mg a day. 2. Synthroid, vitamins. 3. Avodart 0.5 mg. 4. Flomax 0.4 mg a day. 5. Simvastatin 8 mg a day. 6. Amlodipine 5 mg a day. 7. Atenolol 25 mg a day. 8. Diltiazem 360 mg a day. 9. Lasix 20 mg per day. 10.Lisinopril 80 mg a day. 11.Lantus insulin 36 units at bedtime. 12.Pain medication and Zofran.  ALLERGIES: 1. CIPRO. 2. ERYTHROMYCIN BASE.  PHYSICAL EXAMINATION:  GENERAL:  Shows a well-developed white male in no acute distress. VITAL SIGNS:  Blood pressure is 132/54, temperature 99.1, pulse 65, and respiratory rate is 20. ABDOMEN:  Soft plus bowel sounds without organomegaly or masses.  The patient has a mild CVA pain in the right. GENITOURINARY:  Shows normal external male genitalia.  LABORATORIES:  Show white blood cell count 10,500, hemoglobin 11.9, hematocrit 33.7, sodium is 136, potassium 3.5, chloride 98, CO2 is  26, BUN is 13, creatinine 1.33, and glucose random at 130.  CT scan shows no visualization of the previously found right proximal ureteral stone.  There are no other stones or stone fragments within the right renal collecting system, urine, or the bladder.  There is significant decrease in his right side hydronephrosis.  IMPRESSION:  The patient appears much better in the emergency room, and is completely normal after 1 intravenous dose of Zofran 4 mg.  He does have thrush in his throat, and I will stop his sulfur for now.  He will talk to Dr. Charlann Boxer next week.  The operative note on [name] and his serial number is [id number].     Erik Burkett I. Patsi Sears, M.D.     SIT/MEDQ  D:  11/28/2010  T:  11/28/2010  Job:  [id number]  cc:   Madlyn Frankel Charlann Boxer, M.D. Fax: [phone]  Electronically Signed by Jethro Bolus M.D. on 12/04/2010 12:53:12 PM

## 2010-12-07 DIAGNOSIS — K219 Gastro-esophageal reflux disease without esophagitis: Secondary | ICD-10-CM | POA: Insufficient documentation

## 2011-01-04 DIAGNOSIS — N400 Enlarged prostate without lower urinary tract symptoms: Secondary | ICD-10-CM | POA: Insufficient documentation

## 2011-01-05 ENCOUNTER — Encounter: Payer: Self-pay | Admitting: Cardiology

## 2011-01-05 ENCOUNTER — Ambulatory Visit (INDEPENDENT_AMBULATORY_CARE_PROVIDER_SITE_OTHER): Payer: Medicare Other | Admitting: Cardiology

## 2011-01-05 VITALS — BP 144/68 | HR 76 | Ht 66.0 in | Wt 221.0 lb

## 2011-01-05 DIAGNOSIS — I2510 Atherosclerotic heart disease of native coronary artery without angina pectoris: Secondary | ICD-10-CM

## 2011-01-05 DIAGNOSIS — E785 Hyperlipidemia, unspecified: Secondary | ICD-10-CM

## 2011-01-05 DIAGNOSIS — I10 Essential (primary) hypertension: Secondary | ICD-10-CM

## 2011-01-05 NOTE — Progress Notes (Signed)
[name] Date of Birth: 1928-06-16   History of Present Illness: [name] is seen for followup today. He states he has been doing very well. He denies any chest pain, shortness of breath, or palpitations. He continues to exercise regularly. He did have lithotripsy for a large renal calculi 6 weeks ago. Following this he had a fair amount of nausea but this has resolved.  Current Outpatient Prescriptions on File Prior to Visit  Medication Sig Dispense Refill  . amLODipine (NORVASC) 5 MG tablet Take 5 mg by mouth 2 (two) times daily.        [name] atenolol (TENORMIN) 25 MG tablet Take 25 mg by mouth daily.        . diclofenac sodium (VOLTAREN) 1 % GEL Apply topically.        [name] diltiazem (CARDIZEM CD) 180 MG 24 hr capsule Take 180 mg by mouth 2 (two) times daily.        . furosemide (LASIX) 20 MG tablet Take 20 mg by mouth 2 (two) times daily.        . insulin glargine (LANTUS) 100 UNIT/ML injection Inject 40 Units into the skin daily.        [name] lisinopril (PRINIVIL,ZESTRIL) 40 MG tablet Take 40 mg by mouth 2 (two) times daily.       . metFORMIN (GLUCOPHAGE) 500 MG tablet Take 500 mg by mouth 2 (two) times daily with a meal. 2 TABLETS IN THE MORNING, 2 tablets in the evening       . simvastatin (ZOCOR) 40 MG tablet Take 40 mg by mouth 2 (two) times daily.        . Tamsulosin HCl (FLOMAX) 0.4 MG CAPS Take 0.4 mg by mouth daily.          Allergies  Allergen Reactions  . Amoxicillin   . Cefadroxil   . Ciprofloxacin   . Erythromycin     Past Medical History  Diagnosis Date  . CAD (coronary artery disease)     CABG IN 1990  . Mild aortic stenosis   . HTN (hypertension)   . Diabetes mellitus type 2, insulin dependent   . Hyperlipemia   . Bladder outlet obstruction   . BPH (benign prostatic hyperplasia)   . Elevated cholesterol   . Kidney stones   . Abdominal pain   . Constipation   . Bladder outlet obstruction   . Acute renal failure      resolved  . Urinary tract  infection      Enterococcus  . Rib fractures     left rib fractures being treated with pain medications  . Arthritis     Past Surgical History  Procedure Date  . Coronary artery bypass graft 1990  . Total hip arthroplasty 2000    RIGHT HIP  . Cardiac catheterization 2003  . Carpel tunnel     right  . Cataract extraction, bilateral   . Lithotripsy     History  Smoking status  . Former Smoker  . Types: Cigarettes  . Quit date: 05/11/1958  Smokeless tobacco  . Not on file    History  Alcohol Use No    Family History  Problem Relation Age of Onset  . Brain cancer Father   . Throat cancer Brother     Review of Systems: As noted in history of present illness.  All other systems were reviewed and are negative.  Physical Exam: BP 144/68  Pulse 76  Ht 5\' 6"  (1.676 m)  Wt 221 lb (100.245 kg)  BMI 35.67 kg/m2 He is a pleasant elderly white male who is obese. HEENT exam is unremarkable. Sclera are clear. Oropharynx is clear. Neck is supple no JVD, adenopathy, thyromegaly, or bruits. Lungs are clear. Cardiac exam reveals a grade 1/6 systolic ejection murmur right upper sternal border. Abdomen is obese, soft, nontender. He has no edema. Pedal pulses are palpable. He is alert and oriented x3. Cranial nerves II through XII are intact.  LABORATORY DATA: Recent A1c was 7.4%  Assessment / Plan:

## 2011-01-05 NOTE — Assessment & Plan Note (Signed)
Blood pressure control is acceptable. He will continue on his current antihypertensive therapy.

## 2011-01-05 NOTE — Patient Instructions (Signed)
Continue your current medications  I will see you again in 6 months.   

## 2011-01-05 NOTE — Assessment & Plan Note (Signed)
Lexiscan Myoview study in February of this year was normal. He has no anginal symptoms. We will continue with his risk factor modification.

## 2011-01-05 NOTE — Assessment & Plan Note (Signed)
He is on high-dose simvastatin therapy. Lab work is followed by Dr. Duanne Guess.

## 2011-06-08 DIAGNOSIS — M19049 Primary osteoarthritis, unspecified hand: Secondary | ICD-10-CM | POA: Insufficient documentation

## 2011-06-08 DIAGNOSIS — H9190 Unspecified hearing loss, unspecified ear: Secondary | ICD-10-CM | POA: Insufficient documentation

## 2011-07-30 ENCOUNTER — Encounter: Payer: Self-pay | Admitting: Cardiology

## 2011-07-30 ENCOUNTER — Ambulatory Visit (INDEPENDENT_AMBULATORY_CARE_PROVIDER_SITE_OTHER): Payer: Medicare Other | Admitting: Cardiology

## 2011-07-30 VITALS — BP 118/60 | HR 70 | Ht 66.0 in | Wt 226.0 lb

## 2011-07-30 DIAGNOSIS — I2510 Atherosclerotic heart disease of native coronary artery without angina pectoris: Secondary | ICD-10-CM

## 2011-07-30 DIAGNOSIS — I4891 Unspecified atrial fibrillation: Secondary | ICD-10-CM

## 2011-07-30 DIAGNOSIS — I482 Chronic atrial fibrillation, unspecified: Secondary | ICD-10-CM | POA: Insufficient documentation

## 2011-07-30 DIAGNOSIS — I10 Essential (primary) hypertension: Secondary | ICD-10-CM

## 2011-07-30 DIAGNOSIS — E119 Type 2 diabetes mellitus without complications: Secondary | ICD-10-CM

## 2011-07-30 DIAGNOSIS — E139 Other specified diabetes mellitus without complications: Secondary | ICD-10-CM

## 2011-07-30 NOTE — Progress Notes (Signed)
[name] Date of Birth: Sep 08, 1928   History of Present Illness: [name] is seen for followup today. [name] states [name] has been doing very well. [name] denies any chest pain, shortness of breath, or palpitations. [name] continues to exercise regularly. [name] major complaint has been of hearing loss. [name] was fitted for hearing aids but was never able to find one that fit well. [name] has had some vertigo symptoms for which she uses meclizine. [name] reports that [name] sugars have been good. [name] has problems with pain and swelling in [name] left hand it is planning to see orthopedics. [name] denies any lightheadedness or syncope. [name] generally feels very well.  Current Outpatient Prescriptions on File Prior to Visit  Medication Sig Dispense Refill  . amLODipine (NORVASC) 5 MG tablet Take 5 mg by mouth 2 (two) times daily.        [name] amoxicillin (AMOXIL) 500 MG capsule Take 500 mg by mouth daily. Take before dentist appt.       [name] atenolol (TENORMIN) 25 MG tablet Take 25 mg by mouth daily.        . diclofenac sodium (VOLTAREN) 1 % GEL Apply topically.        [name] diltiazem (CARDIZEM CD) 180 MG 24 hr capsule Take 180 mg by mouth 2 (two) times daily.        . furosemide (LASIX) 20 MG tablet Take 20 mg by mouth 2 (two) times daily.        . insulin glargine (LANTUS) 100 UNIT/ML injection Inject 40 Units into the skin daily.        . metFORMIN (GLUCOPHAGE) 500 MG tablet Take 500 mg by mouth 2 (two) times daily with a meal. 2 TABLETS IN THE MORNING, 2 tablets in the evening       . simvastatin (ZOCOR) 40 MG tablet Take 40 mg by mouth 2 (two) times daily.        . Tamsulosin HCl (FLOMAX) 0.4 MG CAPS Take 0.4 mg by mouth daily.          Allergies  Allergen Reactions  . Amoxicillin   . Cefadroxil   . Ciprofloxacin   . Erythromycin     Past Medical History  Diagnosis Date  . CAD (coronary artery disease)     CABG IN 1990  . Mild aortic stenosis   . HTN (hypertension)   . Diabetes mellitus type 2, insulin dependent   .  Hyperlipemia   . Bladder outlet obstruction   . BPH (benign prostatic hyperplasia)   . Elevated cholesterol   . Kidney stones   . Abdominal pain   . Constipation   . Bladder outlet obstruction   . Acute renal failure      resolved  . Urinary tract infection      Enterococcus  . Rib fractures     left rib fractures being treated with pain medications  . Arthritis     Past Surgical History  Procedure Date  . Coronary artery bypass graft 1990  . Total hip arthroplasty 2000    RIGHT HIP  . Cardiac catheterization 2003  . Carpel tunnel     right  . Cataract extraction, bilateral   . Lithotripsy     History  Smoking status  . Former Smoker  . Types: Cigarettes  . Quit date: 05/11/1958  Smokeless tobacco  . Not on file    History  Alcohol Use No    Family History  Problem Relation Age of Onset  .  Brain cancer Father   . Throat cancer Brother     Review of Systems: As noted in history of present illness.  All other systems were reviewed and are negative.  Physical Exam: BP 118/60  Pulse 70  Ht 5\' 6"  (1.676 m)  Wt 226 lb (102.513 kg)  BMI 36.48 kg/m2 [name] is a pleasant elderly white male who is obese. HEENT exam is unremarkable. Sclera are clear. PERRLA. Oropharynx is clear. Neck is supple no JVD, adenopathy, thyromegaly, or bruits. Lungs are clear. Cardiac exam reveals a grade 1/6 systolic ejection murmur right upper sternal border. [name] pulse is irregular. Abdomen is obese, soft, nontender. [name] has no edema. Pedal pulses are palpable. [name] is alert and oriented x3. Cranial nerves II through XII are intact. Skin is warm and dry. LABORATORY DATA: ECG today demonstrates atrial fibrillation with controlled ventricular response of 75 beats per minute. There is a nonspecific T-wave abnormality. Atrial fibrillation is new since September 2012.  Assessment / Plan:

## 2011-07-30 NOTE — Assessment & Plan Note (Signed)
He remains asymptomatic from a cardiac standpoint. He has had a nuclear stress test within the last year which was unremarkable. Continue his antianginal therapy.

## 2011-07-30 NOTE — Assessment & Plan Note (Signed)
Blood pressure is well controlled on his current medication. 

## 2011-07-30 NOTE — Assessment & Plan Note (Signed)
Atrial fibrillation is of new onset. Duration is unknown. He is asymptomatic. His rate is well controlled on atenolol and diltiazem. He has a [place] score of 3. I recommended anticoagulation and we'll start him on Xarelto 20 mg daily. I recommended that he not take aspirin or nonsteroidal anti-inflammatory drugs with this. We will obtain a copy of his recent lab work with Dr. Duanne Guess. Make sure that his thyroid has been checked. We'll obtain an echocardiogram. I'll see him back in 6 weeks for followup. Since he is asymptomatic there is probably little need to try and restore sinus rhythm.

## 2011-07-30 NOTE — Patient Instructions (Signed)
We will start you on a blood thinner to reduce your risk of stroke. Xarelto 20 mg daily.  Do not take ASA or other nonsteroidal anti-inflammatory drugs with this (Advil, Aleve, ibuprofen, naprosyn, etc.)  We will schedule your for an echocardiogram  We will get a copy of your lab work from Dr. Duanne Guess.   I will see you again in 6 weeks.

## 2011-08-05 ENCOUNTER — Ambulatory Visit (HOSPITAL_COMMUNITY): Payer: Medicare Other | Attending: Cardiology

## 2011-08-05 DIAGNOSIS — I517 Cardiomegaly: Secondary | ICD-10-CM | POA: Insufficient documentation

## 2011-08-05 DIAGNOSIS — I10 Essential (primary) hypertension: Secondary | ICD-10-CM

## 2011-08-05 DIAGNOSIS — E139 Other specified diabetes mellitus without complications: Secondary | ICD-10-CM

## 2011-08-05 DIAGNOSIS — E119 Type 2 diabetes mellitus without complications: Secondary | ICD-10-CM | POA: Insufficient documentation

## 2011-08-05 DIAGNOSIS — I2510 Atherosclerotic heart disease of native coronary artery without angina pectoris: Secondary | ICD-10-CM | POA: Insufficient documentation

## 2011-08-05 DIAGNOSIS — I4891 Unspecified atrial fibrillation: Secondary | ICD-10-CM

## 2011-08-05 DIAGNOSIS — I359 Nonrheumatic aortic valve disorder, unspecified: Secondary | ICD-10-CM | POA: Insufficient documentation

## 2011-08-05 NOTE — Progress Notes (Signed)
Echocardiogram performed.  

## 2011-08-26 ENCOUNTER — Telehealth: Payer: Self-pay | Admitting: Cardiology

## 2011-08-26 NOTE — Telephone Encounter (Signed)
Patient's wife called no answer.Left message will check with Dr.Jordan next week about holding xarelto and call her back.

## 2011-08-26 NOTE — Telephone Encounter (Signed)
New Problem:    Patient's Wife called in wanting to know when her husband shout stop taking his [place] prior to his carpel tunnel surgical procedure on 09/07/11.  Please call back.

## 2011-08-27 NOTE — Telephone Encounter (Signed)
Patient called was told Dr.Jordan advises to hold xarelto 2 days before surgery.

## 2011-08-27 NOTE — Telephone Encounter (Signed)
Should stop Xarelto 2 days prior to surgery. Chael Urenda [place] MD, [facility]

## 2011-08-30 ENCOUNTER — Telehealth: Payer: Self-pay | Admitting: Cardiology

## 2011-08-30 NOTE — Telephone Encounter (Signed)
New msg Pt is taking xarelto and he wants to know what other med he can take. Please call

## 2011-08-30 NOTE — Telephone Encounter (Signed)
Patient called stated he was having severe pain in Lf hand and was scheduled for surgery next week.States was calling to make sure ok to take oxycodone as needed.Spoke with Norma Fredrickson NP she advised ok to take as needed.

## 2011-09-03 ENCOUNTER — Telehealth: Payer: Self-pay

## 2011-09-03 NOTE — Telephone Encounter (Signed)
Patient called was told spoke to Dr.Jordan he advised to hold xarelto 2 days prior to surgery.

## 2011-09-08 ENCOUNTER — Ambulatory Visit: Payer: Medicare Other | Admitting: Cardiology

## 2011-09-09 ENCOUNTER — Encounter: Payer: Self-pay | Admitting: Cardiology

## 2011-09-09 ENCOUNTER — Ambulatory Visit (INDEPENDENT_AMBULATORY_CARE_PROVIDER_SITE_OTHER): Payer: Medicare Other | Admitting: Cardiology

## 2011-09-09 VITALS — BP 129/79 | HR 56 | Ht 66.0 in | Wt 227.0 lb

## 2011-09-09 DIAGNOSIS — Z7901 Long term (current) use of anticoagulants: Secondary | ICD-10-CM

## 2011-09-09 DIAGNOSIS — I2510 Atherosclerotic heart disease of native coronary artery without angina pectoris: Secondary | ICD-10-CM

## 2011-09-09 DIAGNOSIS — I4891 Unspecified atrial fibrillation: Secondary | ICD-10-CM

## 2011-09-09 DIAGNOSIS — I10 Essential (primary) hypertension: Secondary | ICD-10-CM

## 2011-09-09 MED ORDER — WARFARIN SODIUM 5 MG PO TABS: 5.0000 mg | ORAL_TABLET | Freq: Every day | ORAL | Status: DC

## 2011-09-09 NOTE — Progress Notes (Signed)
[name] Date of Birth: 1928/05/05   History of Present Illness: [name] is seen for followup today. He just underwent carpal tunnel surgery on the left on Tuesday. His Xarelto was held for this procedure. He has not yet resumed it. He states his medicine is too expensive and caused him $180 a month. He wants to know his alternatives. He denies any significant palpitations, dizziness, chest pain, or shortness of breath.  Current Outpatient Prescriptions on File Prior to Visit  Medication Sig Dispense Refill  . amLODipine (NORVASC) 5 MG tablet Take 5 mg by mouth 2 (two) times daily.        [name] amoxicillin (AMOXIL) 500 MG capsule Take 500 mg by mouth daily. Take before dentist appt.       [name] atenolol (TENORMIN) 25 MG tablet Take 25 mg by mouth daily.        . AVODART 0.5 MG capsule Take 0.5 mg by mouth every other day.       . diclofenac sodium (VOLTAREN) 1 % GEL Apply topically.        [name] diltiazem (CARDIZEM CD) 180 MG 24 hr capsule Take 180 mg by mouth 2 (two) times daily.        . furosemide (LASIX) 20 MG tablet Take 20 mg by mouth 2 (two) times daily.        . insulin glargine (LANTUS) 100 UNIT/ML injection Inject 40 Units into the skin daily.        [name] lisinopril (PRINIVIL,ZESTRIL) 40 MG tablet Take 40 mg by mouth daily.      . metFORMIN (GLUCOPHAGE) 500 MG tablet Take 500 mg by mouth 2 (two) times daily with a meal. 2 TABLETS IN THE MORNING, 2 tablets in the evening       . simvastatin (ZOCOR) 40 MG tablet Take 40 mg by mouth 2 (two) times daily.        . Tamsulosin HCl (FLOMAX) 0.4 MG CAPS Take 0.4 mg by mouth daily.        [name] warfarin (COUMADIN) 5 MG tablet Take 1 tablet (5 mg total) by mouth daily.  90 tablet  3    Allergies  Allergen Reactions  . Amoxicillin   . Cefadroxil   . Ciprofloxacin   . Erythromycin     Past Medical History  Diagnosis Date  . CAD (coronary artery disease)     CABG IN 1990  . Mild aortic stenosis   . HTN (hypertension)   . Diabetes  mellitus type 2, insulin dependent   . Hyperlipemia   . Bladder outlet obstruction   . BPH (benign prostatic hyperplasia)   . Elevated cholesterol   . Kidney stones   . Abdominal pain   . Constipation   . Bladder outlet obstruction   . Acute renal failure      resolved  . Urinary tract infection      Enterococcus  . Rib fractures     left rib fractures being treated with pain medications  . Arthritis     Past Surgical History  Procedure Date  . Coronary artery bypass graft 1990  . Total hip arthroplasty 2000    RIGHT HIP  . Cardiac catheterization 2003  . Carpel tunnel 08/2010    right  . Cataract extraction, bilateral   . Lithotripsy     History  Smoking status  . Former Smoker  . Types: Cigarettes  . Quit date: 05/11/1958  Smokeless tobacco  . Not on file  History  Alcohol Use No    Family History  Problem Relation Age of Onset  . Brain cancer Father   . Throat cancer Brother     Review of Systems: As noted in history of present illness.  All other systems were reviewed and are negative.  Physical Exam: BP 129/79  Pulse 56  Ht 5\' 6"  (1.676 m)  Wt 227 lb (102.967 kg)  BMI 36.64 kg/m2 He is a pleasant elderly white male who is obese. HEENT exam is unremarkable. Sclera are clear. PERRLA. Oropharynx is clear. Neck is supple no JVD, adenopathy, thyromegaly, or bruits. Lungs are clear. Cardiac exam reveals a grade 1/6 systolic ejection murmur right upper sternal border. His pulse is irregular. Abdomen is obese, soft, nontender. He has no edema. Pedal pulses are palpable. He is alert and oriented x3. Cranial nerves II through XII are intact. Skin is warm and dry. LABORATORY DATA: Echocardiogram demonstrated inferior basal hypokinesis with overall ejection fraction of 60%. There was aortic valve sclerosis with very mild stenosis. The left atrium was mildly dilated as was the right atrium.  Assessment / Plan:

## 2011-09-09 NOTE — Patient Instructions (Signed)
So not take Xarelto  We will start you on coumadin 5 mg daily and arrange follow up in our Coumadin clinic  Continue your other medications  I will see you back in 4 months.

## 2011-09-09 NOTE — Assessment & Plan Note (Signed)
His rate is well controlled. Given his cost concerns we will switch him to Coumadin beginning at 5 mg daily. He will start this evening. We will arrange followup in our Coumadin clinic. Remain on his other medications. Again I see no benefit to cardioversion. We'll treat him long-term with rate control and anticoagulation.

## 2011-09-09 NOTE — Assessment & Plan Note (Signed)
Blood pressure is well controlled on his current regimen 

## 2011-09-09 NOTE — Assessment & Plan Note (Signed)
He has no symptoms of angina. We'll continue with atenolol and amlodipine.

## 2011-09-15 ENCOUNTER — Ambulatory Visit (INDEPENDENT_AMBULATORY_CARE_PROVIDER_SITE_OTHER): Payer: Medicare Other | Admitting: *Deleted

## 2011-09-15 DIAGNOSIS — I2510 Atherosclerotic heart disease of native coronary artery without angina pectoris: Secondary | ICD-10-CM

## 2011-09-15 DIAGNOSIS — Z7901 Long term (current) use of anticoagulants: Secondary | ICD-10-CM

## 2011-09-15 DIAGNOSIS — I4891 Unspecified atrial fibrillation: Secondary | ICD-10-CM

## 2011-09-15 DIAGNOSIS — I10 Essential (primary) hypertension: Secondary | ICD-10-CM

## 2011-09-15 LAB — POCT INR: INR: 1.5

## 2011-09-22 ENCOUNTER — Ambulatory Visit (INDEPENDENT_AMBULATORY_CARE_PROVIDER_SITE_OTHER): Payer: Medicare Other | Admitting: *Deleted

## 2011-09-22 DIAGNOSIS — I10 Essential (primary) hypertension: Secondary | ICD-10-CM

## 2011-09-22 DIAGNOSIS — Z7901 Long term (current) use of anticoagulants: Secondary | ICD-10-CM

## 2011-09-22 DIAGNOSIS — I2510 Atherosclerotic heart disease of native coronary artery without angina pectoris: Secondary | ICD-10-CM

## 2011-09-22 DIAGNOSIS — I4891 Unspecified atrial fibrillation: Secondary | ICD-10-CM

## 2011-09-22 LAB — POCT INR: INR: 2.3

## 2011-10-13 ENCOUNTER — Ambulatory Visit (INDEPENDENT_AMBULATORY_CARE_PROVIDER_SITE_OTHER): Payer: Medicare Other | Admitting: *Deleted

## 2011-10-13 DIAGNOSIS — I2510 Atherosclerotic heart disease of native coronary artery without angina pectoris: Secondary | ICD-10-CM

## 2011-10-13 DIAGNOSIS — I10 Essential (primary) hypertension: Secondary | ICD-10-CM

## 2011-10-13 DIAGNOSIS — I4891 Unspecified atrial fibrillation: Secondary | ICD-10-CM

## 2011-10-13 DIAGNOSIS — Z7901 Long term (current) use of anticoagulants: Secondary | ICD-10-CM

## 2011-10-13 LAB — POCT INR: INR: 2.1

## 2011-10-28 DIAGNOSIS — J309 Allergic rhinitis, unspecified: Secondary | ICD-10-CM | POA: Insufficient documentation

## 2011-11-10 ENCOUNTER — Ambulatory Visit (INDEPENDENT_AMBULATORY_CARE_PROVIDER_SITE_OTHER): Payer: Medicare Other | Admitting: *Deleted

## 2011-11-10 DIAGNOSIS — I10 Essential (primary) hypertension: Secondary | ICD-10-CM

## 2011-11-10 DIAGNOSIS — Z7901 Long term (current) use of anticoagulants: Secondary | ICD-10-CM

## 2011-11-10 DIAGNOSIS — I4891 Unspecified atrial fibrillation: Secondary | ICD-10-CM

## 2011-11-10 DIAGNOSIS — I2510 Atherosclerotic heart disease of native coronary artery without angina pectoris: Secondary | ICD-10-CM

## 2011-11-10 LAB — POCT INR: INR: 2.5

## 2011-12-15 ENCOUNTER — Ambulatory Visit (INDEPENDENT_AMBULATORY_CARE_PROVIDER_SITE_OTHER): Payer: Medicare Other | Admitting: *Deleted

## 2011-12-15 DIAGNOSIS — I4891 Unspecified atrial fibrillation: Secondary | ICD-10-CM

## 2011-12-15 DIAGNOSIS — Z7901 Long term (current) use of anticoagulants: Secondary | ICD-10-CM

## 2011-12-15 DIAGNOSIS — I2510 Atherosclerotic heart disease of native coronary artery without angina pectoris: Secondary | ICD-10-CM

## 2011-12-15 DIAGNOSIS — I10 Essential (primary) hypertension: Secondary | ICD-10-CM

## 2011-12-15 LAB — POCT INR: INR: 2.3

## 2012-01-04 ENCOUNTER — Telehealth: Payer: Self-pay | Admitting: Cardiology

## 2012-01-04 NOTE — Telephone Encounter (Signed)
New Problem:    Patient wanted to know if he could take Zyrtec while on coumadin.  Please call back.

## 2012-01-04 NOTE — Telephone Encounter (Signed)
Telephoned pt to inform him that he could use Zyrtec while on coumadin. Please call for any other med changes.

## 2012-01-26 ENCOUNTER — Ambulatory Visit (INDEPENDENT_AMBULATORY_CARE_PROVIDER_SITE_OTHER): Payer: Medicare Other | Admitting: *Deleted

## 2012-01-26 DIAGNOSIS — I4891 Unspecified atrial fibrillation: Secondary | ICD-10-CM

## 2012-01-26 DIAGNOSIS — I2510 Atherosclerotic heart disease of native coronary artery without angina pectoris: Secondary | ICD-10-CM

## 2012-01-26 DIAGNOSIS — I10 Essential (primary) hypertension: Secondary | ICD-10-CM

## 2012-01-26 DIAGNOSIS — Z7901 Long term (current) use of anticoagulants: Secondary | ICD-10-CM

## 2012-01-26 LAB — POCT INR: INR: 2.1

## 2012-01-31 DIAGNOSIS — E669 Obesity, unspecified: Secondary | ICD-10-CM | POA: Insufficient documentation

## 2012-01-31 DIAGNOSIS — M1710 Unilateral primary osteoarthritis, unspecified knee: Secondary | ICD-10-CM | POA: Insufficient documentation

## 2012-03-08 ENCOUNTER — Telehealth: Payer: Self-pay | Admitting: *Deleted

## 2012-03-08 ENCOUNTER — Ambulatory Visit (INDEPENDENT_AMBULATORY_CARE_PROVIDER_SITE_OTHER): Payer: Medicare Other | Admitting: *Deleted

## 2012-03-08 DIAGNOSIS — I2510 Atherosclerotic heart disease of native coronary artery without angina pectoris: Secondary | ICD-10-CM

## 2012-03-08 DIAGNOSIS — Z7901 Long term (current) use of anticoagulants: Secondary | ICD-10-CM

## 2012-03-08 DIAGNOSIS — I4891 Unspecified atrial fibrillation: Secondary | ICD-10-CM

## 2012-03-08 DIAGNOSIS — I10 Essential (primary) hypertension: Secondary | ICD-10-CM

## 2012-03-08 LAB — POCT INR: INR: 2.6

## 2012-03-08 NOTE — Telephone Encounter (Signed)
Telephoned pt and informed him that Dr Swaziland has cleared him to stop for pending hand surgery.

## 2012-03-08 NOTE — Telephone Encounter (Signed)
Message copied by Mahari Strahm, Franchot Mimes on Wed Mar 08, 2012  2:06 PM ------      Message from: [place], PETER M      Created: Wed Mar 08, 2012  8:49 AM      Regarding: RE: Pending hand surgery        He is cleared and may stop coumadin 5 days before.            Peter Swaziland MD, [facility]                  ----- Message -----         From: Raul Del, RN         Sent: 03/08/2012   8:30 AM           To: Peter M Swaziland, MD, Charna Elizabeth, LPN      Subject: Pending hand surgery                                     Pt is pending left pinky finger carpal tunnel hand surgery on 03/23/12 by Dr Amanda Pea, he needs to be off for 5 days is he cleared?

## 2012-03-16 ENCOUNTER — Telehealth: Payer: Self-pay

## 2012-03-16 NOTE — Telephone Encounter (Signed)
Spoke to Dr.Gramig's office was told Dr.Jordan advised ok for patient to hold coumadin 5 days prior to hand surgery.

## 2012-04-03 ENCOUNTER — Ambulatory Visit (INDEPENDENT_AMBULATORY_CARE_PROVIDER_SITE_OTHER): Payer: Medicare Other | Admitting: *Deleted

## 2012-04-03 DIAGNOSIS — I4891 Unspecified atrial fibrillation: Secondary | ICD-10-CM

## 2012-04-03 DIAGNOSIS — I2510 Atherosclerotic heart disease of native coronary artery without angina pectoris: Secondary | ICD-10-CM

## 2012-04-03 DIAGNOSIS — Z7901 Long term (current) use of anticoagulants: Secondary | ICD-10-CM

## 2012-04-03 DIAGNOSIS — I10 Essential (primary) hypertension: Secondary | ICD-10-CM

## 2012-04-03 LAB — POCT INR: INR: 1.9

## 2012-05-01 ENCOUNTER — Ambulatory Visit (INDEPENDENT_AMBULATORY_CARE_PROVIDER_SITE_OTHER): Payer: Medicare Other | Admitting: Pharmacist

## 2012-05-01 DIAGNOSIS — I4891 Unspecified atrial fibrillation: Secondary | ICD-10-CM

## 2012-05-01 DIAGNOSIS — Z7901 Long term (current) use of anticoagulants: Secondary | ICD-10-CM

## 2012-05-01 DIAGNOSIS — I2510 Atherosclerotic heart disease of native coronary artery without angina pectoris: Secondary | ICD-10-CM

## 2012-05-01 DIAGNOSIS — I10 Essential (primary) hypertension: Secondary | ICD-10-CM

## 2012-05-01 LAB — POCT INR: INR: 1.9

## 2012-05-29 ENCOUNTER — Ambulatory Visit (INDEPENDENT_AMBULATORY_CARE_PROVIDER_SITE_OTHER): Payer: Medicare Other | Admitting: Pharmacist

## 2012-05-29 DIAGNOSIS — I10 Essential (primary) hypertension: Secondary | ICD-10-CM

## 2012-05-29 DIAGNOSIS — I2510 Atherosclerotic heart disease of native coronary artery without angina pectoris: Secondary | ICD-10-CM

## 2012-05-29 DIAGNOSIS — Z7901 Long term (current) use of anticoagulants: Secondary | ICD-10-CM

## 2012-05-29 DIAGNOSIS — I4891 Unspecified atrial fibrillation: Secondary | ICD-10-CM

## 2012-05-29 LAB — POCT INR: INR: 3.7

## 2012-06-19 ENCOUNTER — Ambulatory Visit (INDEPENDENT_AMBULATORY_CARE_PROVIDER_SITE_OTHER): Payer: Medicare Other | Admitting: *Deleted

## 2012-06-19 DIAGNOSIS — Z7901 Long term (current) use of anticoagulants: Secondary | ICD-10-CM

## 2012-06-19 DIAGNOSIS — I2510 Atherosclerotic heart disease of native coronary artery without angina pectoris: Secondary | ICD-10-CM

## 2012-06-19 DIAGNOSIS — I10 Essential (primary) hypertension: Secondary | ICD-10-CM

## 2012-06-19 DIAGNOSIS — I4891 Unspecified atrial fibrillation: Secondary | ICD-10-CM

## 2012-06-19 LAB — POCT INR: INR: 4.1

## 2012-06-25 ENCOUNTER — Other Ambulatory Visit: Payer: Self-pay | Admitting: Cardiology

## 2012-07-03 ENCOUNTER — Ambulatory Visit (INDEPENDENT_AMBULATORY_CARE_PROVIDER_SITE_OTHER): Payer: Medicare Other | Admitting: Pharmacist

## 2012-07-03 DIAGNOSIS — I10 Essential (primary) hypertension: Secondary | ICD-10-CM

## 2012-07-03 DIAGNOSIS — Z7901 Long term (current) use of anticoagulants: Secondary | ICD-10-CM

## 2012-07-03 DIAGNOSIS — I4891 Unspecified atrial fibrillation: Secondary | ICD-10-CM

## 2012-07-03 DIAGNOSIS — I2510 Atherosclerotic heart disease of native coronary artery without angina pectoris: Secondary | ICD-10-CM

## 2012-07-03 LAB — POCT INR: INR: 3.8

## 2012-07-17 ENCOUNTER — Ambulatory Visit (INDEPENDENT_AMBULATORY_CARE_PROVIDER_SITE_OTHER): Payer: Medicare Other

## 2012-07-17 DIAGNOSIS — Z7901 Long term (current) use of anticoagulants: Secondary | ICD-10-CM

## 2012-07-17 DIAGNOSIS — I2510 Atherosclerotic heart disease of native coronary artery without angina pectoris: Secondary | ICD-10-CM

## 2012-07-17 DIAGNOSIS — I4891 Unspecified atrial fibrillation: Secondary | ICD-10-CM

## 2012-07-17 DIAGNOSIS — I10 Essential (primary) hypertension: Secondary | ICD-10-CM

## 2012-07-17 LAB — POCT INR: INR: 2.8

## 2012-08-07 ENCOUNTER — Ambulatory Visit (INDEPENDENT_AMBULATORY_CARE_PROVIDER_SITE_OTHER): Payer: Medicare Other | Admitting: *Deleted

## 2012-08-07 DIAGNOSIS — I2510 Atherosclerotic heart disease of native coronary artery without angina pectoris: Secondary | ICD-10-CM

## 2012-08-07 DIAGNOSIS — I4891 Unspecified atrial fibrillation: Secondary | ICD-10-CM

## 2012-08-07 DIAGNOSIS — Z7901 Long term (current) use of anticoagulants: Secondary | ICD-10-CM

## 2012-08-07 DIAGNOSIS — I10 Essential (primary) hypertension: Secondary | ICD-10-CM

## 2012-08-07 LAB — POCT INR: INR: 3.3

## 2012-08-21 ENCOUNTER — Ambulatory Visit (INDEPENDENT_AMBULATORY_CARE_PROVIDER_SITE_OTHER): Payer: Medicare Other | Admitting: *Deleted

## 2012-08-21 DIAGNOSIS — I2510 Atherosclerotic heart disease of native coronary artery without angina pectoris: Secondary | ICD-10-CM

## 2012-08-21 DIAGNOSIS — I10 Essential (primary) hypertension: Secondary | ICD-10-CM

## 2012-08-21 DIAGNOSIS — Z7901 Long term (current) use of anticoagulants: Secondary | ICD-10-CM

## 2012-08-21 DIAGNOSIS — I4891 Unspecified atrial fibrillation: Secondary | ICD-10-CM

## 2012-08-21 LAB — POCT INR: INR: 2.6

## 2012-09-18 ENCOUNTER — Ambulatory Visit (INDEPENDENT_AMBULATORY_CARE_PROVIDER_SITE_OTHER): Payer: Medicare Other | Admitting: *Deleted

## 2012-09-18 ENCOUNTER — Telehealth: Payer: Self-pay | Admitting: Cardiology

## 2012-09-18 DIAGNOSIS — I4891 Unspecified atrial fibrillation: Secondary | ICD-10-CM

## 2012-09-18 DIAGNOSIS — Z7901 Long term (current) use of anticoagulants: Secondary | ICD-10-CM

## 2012-09-18 DIAGNOSIS — I2510 Atherosclerotic heart disease of native coronary artery without angina pectoris: Secondary | ICD-10-CM

## 2012-09-18 DIAGNOSIS — I10 Essential (primary) hypertension: Secondary | ICD-10-CM

## 2012-09-18 LAB — POCT INR: INR: 3.1

## 2012-09-18 NOTE — Telephone Encounter (Signed)
Spoke with pt.  We were unable to make his follow up appt because the computers were down this morning.  Appt made. Pt aware.

## 2012-09-18 NOTE — Telephone Encounter (Signed)
New Prob  Pt wants to results of his coumadin

## 2012-09-19 ENCOUNTER — Other Ambulatory Visit: Payer: Self-pay | Admitting: Cardiology

## 2012-10-17 ENCOUNTER — Ambulatory Visit (INDEPENDENT_AMBULATORY_CARE_PROVIDER_SITE_OTHER): Payer: Medicare Other | Admitting: *Deleted

## 2012-10-17 DIAGNOSIS — I2510 Atherosclerotic heart disease of native coronary artery without angina pectoris: Secondary | ICD-10-CM

## 2012-10-17 DIAGNOSIS — Z7901 Long term (current) use of anticoagulants: Secondary | ICD-10-CM

## 2012-10-17 DIAGNOSIS — I10 Essential (primary) hypertension: Secondary | ICD-10-CM

## 2012-10-17 DIAGNOSIS — I4891 Unspecified atrial fibrillation: Secondary | ICD-10-CM

## 2012-10-17 LAB — POCT INR: INR: 2.5

## 2012-11-14 ENCOUNTER — Ambulatory Visit (INDEPENDENT_AMBULATORY_CARE_PROVIDER_SITE_OTHER): Payer: Medicare Other

## 2012-11-14 DIAGNOSIS — Z7901 Long term (current) use of anticoagulants: Secondary | ICD-10-CM

## 2012-11-14 DIAGNOSIS — I4891 Unspecified atrial fibrillation: Secondary | ICD-10-CM

## 2012-11-14 DIAGNOSIS — I10 Essential (primary) hypertension: Secondary | ICD-10-CM

## 2012-11-14 DIAGNOSIS — I2510 Atherosclerotic heart disease of native coronary artery without angina pectoris: Secondary | ICD-10-CM

## 2012-11-14 LAB — POCT INR: INR: 1.7

## 2012-12-05 ENCOUNTER — Ambulatory Visit (INDEPENDENT_AMBULATORY_CARE_PROVIDER_SITE_OTHER): Payer: Medicare Other | Admitting: *Deleted

## 2012-12-05 DIAGNOSIS — I10 Essential (primary) hypertension: Secondary | ICD-10-CM

## 2012-12-05 DIAGNOSIS — Z7901 Long term (current) use of anticoagulants: Secondary | ICD-10-CM

## 2012-12-05 DIAGNOSIS — I2510 Atherosclerotic heart disease of native coronary artery without angina pectoris: Secondary | ICD-10-CM

## 2012-12-05 DIAGNOSIS — I4891 Unspecified atrial fibrillation: Secondary | ICD-10-CM

## 2012-12-05 LAB — POCT INR: INR: 3.1

## 2012-12-25 ENCOUNTER — Ambulatory Visit (INDEPENDENT_AMBULATORY_CARE_PROVIDER_SITE_OTHER): Payer: Medicare Other | Admitting: General Practice

## 2012-12-25 DIAGNOSIS — I10 Essential (primary) hypertension: Secondary | ICD-10-CM

## 2012-12-25 DIAGNOSIS — I4891 Unspecified atrial fibrillation: Secondary | ICD-10-CM

## 2012-12-25 DIAGNOSIS — I2510 Atherosclerotic heart disease of native coronary artery without angina pectoris: Secondary | ICD-10-CM

## 2012-12-25 DIAGNOSIS — Z7901 Long term (current) use of anticoagulants: Secondary | ICD-10-CM

## 2012-12-25 LAB — POCT INR: INR: 2.4

## 2013-01-22 ENCOUNTER — Ambulatory Visit (INDEPENDENT_AMBULATORY_CARE_PROVIDER_SITE_OTHER): Payer: Medicare Other | Admitting: General Practice

## 2013-01-22 DIAGNOSIS — I2510 Atherosclerotic heart disease of native coronary artery without angina pectoris: Secondary | ICD-10-CM

## 2013-01-22 DIAGNOSIS — Z7901 Long term (current) use of anticoagulants: Secondary | ICD-10-CM

## 2013-01-22 DIAGNOSIS — I4891 Unspecified atrial fibrillation: Secondary | ICD-10-CM

## 2013-01-22 DIAGNOSIS — I10 Essential (primary) hypertension: Secondary | ICD-10-CM

## 2013-01-22 LAB — POCT INR: INR: 3.3

## 2013-02-06 DIAGNOSIS — Z7901 Long term (current) use of anticoagulants: Secondary | ICD-10-CM | POA: Insufficient documentation

## 2013-02-14 ENCOUNTER — Ambulatory Visit (INDEPENDENT_AMBULATORY_CARE_PROVIDER_SITE_OTHER): Payer: Medicare Other | Admitting: *Deleted

## 2013-02-14 DIAGNOSIS — I4891 Unspecified atrial fibrillation: Secondary | ICD-10-CM

## 2013-02-14 DIAGNOSIS — I2510 Atherosclerotic heart disease of native coronary artery without angina pectoris: Secondary | ICD-10-CM

## 2013-02-14 DIAGNOSIS — Z7901 Long term (current) use of anticoagulants: Secondary | ICD-10-CM

## 2013-02-14 DIAGNOSIS — I10 Essential (primary) hypertension: Secondary | ICD-10-CM

## 2013-02-14 LAB — POCT INR: INR: 4.2

## 2013-02-23 ENCOUNTER — Ambulatory Visit (INDEPENDENT_AMBULATORY_CARE_PROVIDER_SITE_OTHER): Payer: Medicare Other | Admitting: *Deleted

## 2013-02-23 DIAGNOSIS — I2510 Atherosclerotic heart disease of native coronary artery without angina pectoris: Secondary | ICD-10-CM

## 2013-02-23 DIAGNOSIS — I4891 Unspecified atrial fibrillation: Secondary | ICD-10-CM

## 2013-02-23 DIAGNOSIS — I10 Essential (primary) hypertension: Secondary | ICD-10-CM

## 2013-02-23 DIAGNOSIS — Z7901 Long term (current) use of anticoagulants: Secondary | ICD-10-CM

## 2013-02-23 LAB — POCT INR: INR: 2.7

## 2013-03-09 ENCOUNTER — Other Ambulatory Visit: Payer: Self-pay | Admitting: Cardiology

## 2013-03-09 ENCOUNTER — Ambulatory Visit (INDEPENDENT_AMBULATORY_CARE_PROVIDER_SITE_OTHER): Payer: Medicare Other

## 2013-03-09 DIAGNOSIS — I2510 Atherosclerotic heart disease of native coronary artery without angina pectoris: Secondary | ICD-10-CM

## 2013-03-09 DIAGNOSIS — Z7901 Long term (current) use of anticoagulants: Secondary | ICD-10-CM

## 2013-03-09 DIAGNOSIS — I4891 Unspecified atrial fibrillation: Secondary | ICD-10-CM

## 2013-03-09 DIAGNOSIS — I10 Essential (primary) hypertension: Secondary | ICD-10-CM

## 2013-03-09 LAB — POCT INR: INR: 4.1

## 2013-03-23 ENCOUNTER — Ambulatory Visit (INDEPENDENT_AMBULATORY_CARE_PROVIDER_SITE_OTHER): Payer: Medicare Other | Admitting: *Deleted

## 2013-03-23 DIAGNOSIS — I4891 Unspecified atrial fibrillation: Secondary | ICD-10-CM

## 2013-03-23 DIAGNOSIS — Z7901 Long term (current) use of anticoagulants: Secondary | ICD-10-CM

## 2013-03-23 DIAGNOSIS — I2510 Atherosclerotic heart disease of native coronary artery without angina pectoris: Secondary | ICD-10-CM

## 2013-03-23 DIAGNOSIS — I10 Essential (primary) hypertension: Secondary | ICD-10-CM

## 2013-03-23 LAB — POCT INR: INR: 3

## 2013-04-13 ENCOUNTER — Ambulatory Visit (INDEPENDENT_AMBULATORY_CARE_PROVIDER_SITE_OTHER): Payer: Medicare Other | Admitting: *Deleted

## 2013-04-13 DIAGNOSIS — I10 Essential (primary) hypertension: Secondary | ICD-10-CM

## 2013-04-13 DIAGNOSIS — I4891 Unspecified atrial fibrillation: Secondary | ICD-10-CM

## 2013-04-13 DIAGNOSIS — Z5181 Encounter for therapeutic drug level monitoring: Secondary | ICD-10-CM

## 2013-04-13 DIAGNOSIS — I2510 Atherosclerotic heart disease of native coronary artery without angina pectoris: Secondary | ICD-10-CM

## 2013-04-13 DIAGNOSIS — Z7901 Long term (current) use of anticoagulants: Secondary | ICD-10-CM

## 2013-04-13 LAB — POCT INR: INR: 1.7

## 2013-05-04 ENCOUNTER — Ambulatory Visit (INDEPENDENT_AMBULATORY_CARE_PROVIDER_SITE_OTHER): Payer: Medicare Other | Admitting: Pharmacist

## 2013-05-04 DIAGNOSIS — I2510 Atherosclerotic heart disease of native coronary artery without angina pectoris: Secondary | ICD-10-CM

## 2013-05-04 DIAGNOSIS — Z5181 Encounter for therapeutic drug level monitoring: Secondary | ICD-10-CM

## 2013-05-04 DIAGNOSIS — Z7901 Long term (current) use of anticoagulants: Secondary | ICD-10-CM

## 2013-05-04 DIAGNOSIS — I10 Essential (primary) hypertension: Secondary | ICD-10-CM

## 2013-05-04 DIAGNOSIS — I4891 Unspecified atrial fibrillation: Secondary | ICD-10-CM

## 2013-05-04 LAB — POCT INR: INR: 2

## 2013-05-18 ENCOUNTER — Ambulatory Visit (INDEPENDENT_AMBULATORY_CARE_PROVIDER_SITE_OTHER): Payer: Medicare Other | Admitting: *Deleted

## 2013-05-18 DIAGNOSIS — I2510 Atherosclerotic heart disease of native coronary artery without angina pectoris: Secondary | ICD-10-CM

## 2013-05-18 DIAGNOSIS — I4891 Unspecified atrial fibrillation: Secondary | ICD-10-CM

## 2013-05-18 DIAGNOSIS — I10 Essential (primary) hypertension: Secondary | ICD-10-CM

## 2013-05-18 DIAGNOSIS — Z7901 Long term (current) use of anticoagulants: Secondary | ICD-10-CM

## 2013-05-18 DIAGNOSIS — Z5181 Encounter for therapeutic drug level monitoring: Secondary | ICD-10-CM

## 2013-05-18 LAB — POCT INR: INR: 2.4

## 2013-06-05 ENCOUNTER — Other Ambulatory Visit: Payer: Self-pay | Admitting: Cardiology

## 2013-06-15 ENCOUNTER — Ambulatory Visit (INDEPENDENT_AMBULATORY_CARE_PROVIDER_SITE_OTHER): Payer: Medicare Other | Admitting: *Deleted

## 2013-06-15 DIAGNOSIS — Z5181 Encounter for therapeutic drug level monitoring: Secondary | ICD-10-CM

## 2013-06-15 DIAGNOSIS — I4891 Unspecified atrial fibrillation: Secondary | ICD-10-CM

## 2013-06-15 DIAGNOSIS — Z7901 Long term (current) use of anticoagulants: Secondary | ICD-10-CM

## 2013-06-15 DIAGNOSIS — I2510 Atherosclerotic heart disease of native coronary artery without angina pectoris: Secondary | ICD-10-CM

## 2013-06-15 DIAGNOSIS — I10 Essential (primary) hypertension: Secondary | ICD-10-CM

## 2013-06-15 LAB — POCT INR: INR: 2.5

## 2013-07-13 ENCOUNTER — Ambulatory Visit (INDEPENDENT_AMBULATORY_CARE_PROVIDER_SITE_OTHER): Payer: Medicare Other | Admitting: *Deleted

## 2013-07-13 DIAGNOSIS — I2510 Atherosclerotic heart disease of native coronary artery without angina pectoris: Secondary | ICD-10-CM

## 2013-07-13 DIAGNOSIS — I4891 Unspecified atrial fibrillation: Secondary | ICD-10-CM

## 2013-07-13 DIAGNOSIS — Z7901 Long term (current) use of anticoagulants: Secondary | ICD-10-CM

## 2013-07-13 DIAGNOSIS — I10 Essential (primary) hypertension: Secondary | ICD-10-CM

## 2013-07-13 DIAGNOSIS — Z5181 Encounter for therapeutic drug level monitoring: Secondary | ICD-10-CM

## 2013-07-13 LAB — POCT INR: INR: 3.2

## 2013-08-03 ENCOUNTER — Ambulatory Visit (INDEPENDENT_AMBULATORY_CARE_PROVIDER_SITE_OTHER): Payer: Medicare Other | Admitting: *Deleted

## 2013-08-03 DIAGNOSIS — I2510 Atherosclerotic heart disease of native coronary artery without angina pectoris: Secondary | ICD-10-CM

## 2013-08-03 DIAGNOSIS — Z7901 Long term (current) use of anticoagulants: Secondary | ICD-10-CM

## 2013-08-03 DIAGNOSIS — Z5181 Encounter for therapeutic drug level monitoring: Secondary | ICD-10-CM

## 2013-08-03 DIAGNOSIS — I10 Essential (primary) hypertension: Secondary | ICD-10-CM

## 2013-08-03 DIAGNOSIS — I4891 Unspecified atrial fibrillation: Secondary | ICD-10-CM

## 2013-08-03 LAB — POCT INR: INR: 2.3

## 2013-09-04 ENCOUNTER — Ambulatory Visit (INDEPENDENT_AMBULATORY_CARE_PROVIDER_SITE_OTHER): Payer: Medicare Other | Admitting: *Deleted

## 2013-09-04 DIAGNOSIS — Z5181 Encounter for therapeutic drug level monitoring: Secondary | ICD-10-CM

## 2013-09-04 DIAGNOSIS — Z7901 Long term (current) use of anticoagulants: Secondary | ICD-10-CM

## 2013-09-04 DIAGNOSIS — I4891 Unspecified atrial fibrillation: Secondary | ICD-10-CM

## 2013-09-04 DIAGNOSIS — I2510 Atherosclerotic heart disease of native coronary artery without angina pectoris: Secondary | ICD-10-CM

## 2013-09-04 DIAGNOSIS — I10 Essential (primary) hypertension: Secondary | ICD-10-CM

## 2013-09-04 LAB — POCT INR: INR: 2

## 2013-10-02 ENCOUNTER — Ambulatory Visit (INDEPENDENT_AMBULATORY_CARE_PROVIDER_SITE_OTHER): Payer: Medicare Other

## 2013-10-02 DIAGNOSIS — I10 Essential (primary) hypertension: Secondary | ICD-10-CM

## 2013-10-02 DIAGNOSIS — I2510 Atherosclerotic heart disease of native coronary artery without angina pectoris: Secondary | ICD-10-CM

## 2013-10-02 DIAGNOSIS — Z7901 Long term (current) use of anticoagulants: Secondary | ICD-10-CM

## 2013-10-02 DIAGNOSIS — I4891 Unspecified atrial fibrillation: Secondary | ICD-10-CM

## 2013-10-02 DIAGNOSIS — Z5181 Encounter for therapeutic drug level monitoring: Secondary | ICD-10-CM

## 2013-10-02 LAB — POCT INR: INR: 2.9

## 2013-11-13 ENCOUNTER — Ambulatory Visit (INDEPENDENT_AMBULATORY_CARE_PROVIDER_SITE_OTHER): Payer: Medicare Other | Admitting: *Deleted

## 2013-11-13 DIAGNOSIS — Z5181 Encounter for therapeutic drug level monitoring: Secondary | ICD-10-CM

## 2013-11-13 DIAGNOSIS — Z7901 Long term (current) use of anticoagulants: Secondary | ICD-10-CM

## 2013-11-13 DIAGNOSIS — I4891 Unspecified atrial fibrillation: Secondary | ICD-10-CM

## 2013-11-13 DIAGNOSIS — I10 Essential (primary) hypertension: Secondary | ICD-10-CM

## 2013-11-13 DIAGNOSIS — I2510 Atherosclerotic heart disease of native coronary artery without angina pectoris: Secondary | ICD-10-CM

## 2013-11-13 LAB — POCT INR: INR: 2.6

## 2013-12-12 ENCOUNTER — Telehealth: Payer: Self-pay

## 2013-12-12 MED ORDER — WARFARIN SODIUM 5 MG PO TABS: ORAL_TABLET | ORAL | Status: DC

## 2013-12-12 NOTE — Telephone Encounter (Signed)
Rx sent per pt request 

## 2013-12-25 ENCOUNTER — Ambulatory Visit (INDEPENDENT_AMBULATORY_CARE_PROVIDER_SITE_OTHER): Payer: Medicare Other | Admitting: *Deleted

## 2013-12-25 DIAGNOSIS — Z5181 Encounter for therapeutic drug level monitoring: Secondary | ICD-10-CM

## 2013-12-25 DIAGNOSIS — Z7901 Long term (current) use of anticoagulants: Secondary | ICD-10-CM

## 2013-12-25 DIAGNOSIS — I2510 Atherosclerotic heart disease of native coronary artery without angina pectoris: Secondary | ICD-10-CM

## 2013-12-25 DIAGNOSIS — I4891 Unspecified atrial fibrillation: Secondary | ICD-10-CM

## 2013-12-25 DIAGNOSIS — I10 Essential (primary) hypertension: Secondary | ICD-10-CM

## 2013-12-25 LAB — POCT INR: INR: 2.4

## 2013-12-27 ENCOUNTER — Ambulatory Visit (INDEPENDENT_AMBULATORY_CARE_PROVIDER_SITE_OTHER): Payer: Medicare Other | Admitting: Cardiology

## 2013-12-27 ENCOUNTER — Encounter: Payer: Self-pay | Admitting: Cardiology

## 2013-12-27 VITALS — BP 140/80 | HR 96 | Ht 66.0 in | Wt 226.0 lb

## 2013-12-27 DIAGNOSIS — I2510 Atherosclerotic heart disease of native coronary artery without angina pectoris: Secondary | ICD-10-CM

## 2013-12-27 DIAGNOSIS — I209 Angina pectoris, unspecified: Secondary | ICD-10-CM

## 2013-12-27 DIAGNOSIS — I482 Chronic atrial fibrillation, unspecified: Secondary | ICD-10-CM

## 2013-12-27 DIAGNOSIS — Z7901 Long term (current) use of anticoagulants: Secondary | ICD-10-CM

## 2013-12-27 DIAGNOSIS — I25708 Atherosclerosis of coronary artery bypass graft(s), unspecified, with other forms of angina pectoris: Secondary | ICD-10-CM

## 2013-12-27 DIAGNOSIS — E785 Hyperlipidemia, unspecified: Secondary | ICD-10-CM

## 2013-12-27 DIAGNOSIS — I10 Essential (primary) hypertension: Secondary | ICD-10-CM

## 2013-12-27 NOTE — Patient Instructions (Signed)
Continue your current therapy  I will see you in one year   

## 2013-12-27 NOTE — Progress Notes (Signed)
[name] Date of Birth: 1928-05-10   History of Present Illness: Mr. Chatterjee is seen for followup today. Last seen 2 years ago.  He  Has a history of chronic atrial fibrillation managed with rate control and anticoagulation with coumadin. He also has a history of CAD s/p CABG in 1990. His last stress Myoview was 2008. Echo in 2013 showed normal LV function. Very mild AS.  biatrial enlargement.He denies any significant palpitations, dizziness, chest pain, or shortness of breath. [facility] Feels well. Is now getting his meds thru the [place]. INRs have been therapeutic.  Current Outpatient Prescriptions on File Prior to Visit  Medication Sig Dispense Refill  . AVODART 0.5 MG capsule Take 0.5 mg by mouth every other day.       . insulin glargine (LANTUS) 100 UNIT/ML injection Inject 40 Units into the skin daily.        [name] lisinopril (PRINIVIL,ZESTRIL) 40 MG tablet Take 40 mg by mouth daily.      . metFORMIN (GLUCOPHAGE) 500 MG tablet Take 500 mg by mouth 2 (two) times daily with a meal. 2 TABLETS IN THE MORNING, 2 tablets in the evening       . simvastatin (ZOCOR) 40 MG tablet Take 40 mg by mouth 2 (two) times daily.        . Tamsulosin HCl (FLOMAX) 0.4 MG CAPS Take 0.4 mg by mouth every other day.       . warfarin (COUMADIN) 5 MG tablet 1 tablet daily except 1.5 tablets on Wednesdays or as directed by coumadin clinic  120 tablet  1   No current facility-administered medications on file prior to visit.    Allergies  Allergen Reactions  . Amoxicillin   . Cefadroxil   . Ciprofloxacin   . Erythromycin     Past Medical History  Diagnosis Date  . CAD (coronary artery disease)     CABG IN 1990  . Mild aortic stenosis   . HTN (hypertension)   . Diabetes mellitus type 2, insulin dependent   . Hyperlipemia   . Bladder outlet obstruction   . BPH (benign prostatic hyperplasia)   . Elevated cholesterol   . Kidney stones   . Abdominal pain   .  Constipation   . Bladder outlet obstruction   . Acute renal failure      resolved  . Urinary tract infection      Enterococcus  . Rib fractures     left rib fractures being treated with pain medications  . Arthritis   . Atrial fibrillation     Past Surgical History  Procedure Laterality Date  . Coronary artery bypass graft  1990  . Total hip arthroplasty  2000    RIGHT HIP  . Cardiac catheterization  2003  . Carpel tunnel  08/2010    right  . Cataract extraction, bilateral    . Lithotripsy      History  Smoking status  . Former Smoker  . Types: Cigarettes  . Quit date: 05/11/1958  Smokeless tobacco  . Not on file    History  Alcohol Use No    Family History  Problem Relation Age of Onset  . Brain cancer Father   . Throat cancer Brother     Review of Systems: As noted in history of present illness.  All other systems were reviewed and are negative.  Physical Exam: BP 140/80  Pulse 96  Ht 5\' 6"  (1.676 m)  Wt 226 lb (102.513 kg)  BMI 36.49 kg/m2 He is a pleasant elderly white male who is obese. HEENT exam is unremarkable. Sclera are clear. PERRLA. Oropharynx is clear. Neck is supple no JVD, adenopathy, thyromegaly, or bruits. Lungs are clear. Cardiac exam reveals a grade 1/6 systolic ejection murmur right upper sternal border. His pulse is irregular. Old median sternotomy scar. Abdomen is obese, soft, nontender. He has no edema. Pedal pulses are palpable. He is alert and oriented x3. Cranial nerves II through XII are intact. Skin is warm and dry.  LABORATORY DATA: Ecg today: Atrial fib with rate 96 bpm. ST-T changes c/w inferior ischemia. I have personally reviewed and interpreted this study.   Assessment / Plan: 1. Chronic atrial fibrillation. Rate well controlled. On coumadin. Asymptomatic. Continue current therapy  2. CAD s/p remote CABG 25 years ago. Asymptomatic. Since he is now 80 yo I do not plan on follow up stress testing unless he has  symptoms.  3. Chronic anticoagulation- therapeutic.  4. HTN controlled.  5. DM type 2- on insulin  6. Hyperlipidemia.

## 2014-02-05 ENCOUNTER — Ambulatory Visit (INDEPENDENT_AMBULATORY_CARE_PROVIDER_SITE_OTHER): Payer: Medicare Other | Admitting: Pharmacist

## 2014-02-05 DIAGNOSIS — I2510 Atherosclerotic heart disease of native coronary artery without angina pectoris: Secondary | ICD-10-CM

## 2014-02-05 DIAGNOSIS — I25708 Atherosclerosis of coronary artery bypass graft(s), unspecified, with other forms of angina pectoris: Secondary | ICD-10-CM

## 2014-02-05 DIAGNOSIS — I10 Essential (primary) hypertension: Secondary | ICD-10-CM

## 2014-02-05 DIAGNOSIS — Z5181 Encounter for therapeutic drug level monitoring: Secondary | ICD-10-CM

## 2014-02-05 DIAGNOSIS — I4891 Unspecified atrial fibrillation: Secondary | ICD-10-CM

## 2014-02-05 DIAGNOSIS — Z7901 Long term (current) use of anticoagulants: Secondary | ICD-10-CM

## 2014-02-05 LAB — POCT INR: INR: 1.9

## 2014-02-12 ENCOUNTER — Encounter: Payer: Self-pay | Admitting: Cardiology

## 2014-03-05 ENCOUNTER — Ambulatory Visit (INDEPENDENT_AMBULATORY_CARE_PROVIDER_SITE_OTHER): Payer: Medicare Other

## 2014-03-05 DIAGNOSIS — Z7901 Long term (current) use of anticoagulants: Secondary | ICD-10-CM

## 2014-03-05 DIAGNOSIS — I25708 Atherosclerosis of coronary artery bypass graft(s), unspecified, with other forms of angina pectoris: Secondary | ICD-10-CM

## 2014-03-05 DIAGNOSIS — Z5181 Encounter for therapeutic drug level monitoring: Secondary | ICD-10-CM

## 2014-03-05 DIAGNOSIS — I10 Essential (primary) hypertension: Secondary | ICD-10-CM

## 2014-03-05 DIAGNOSIS — I4891 Unspecified atrial fibrillation: Secondary | ICD-10-CM

## 2014-03-05 DIAGNOSIS — I2510 Atherosclerotic heart disease of native coronary artery without angina pectoris: Secondary | ICD-10-CM

## 2014-03-05 LAB — POCT INR: INR: 2.2

## 2014-03-15 DIAGNOSIS — R42 Dizziness and giddiness: Secondary | ICD-10-CM | POA: Insufficient documentation

## 2014-04-02 ENCOUNTER — Ambulatory Visit (INDEPENDENT_AMBULATORY_CARE_PROVIDER_SITE_OTHER): Payer: Medicare Other | Admitting: *Deleted

## 2014-04-02 DIAGNOSIS — Z5181 Encounter for therapeutic drug level monitoring: Secondary | ICD-10-CM

## 2014-04-02 DIAGNOSIS — I4891 Unspecified atrial fibrillation: Secondary | ICD-10-CM

## 2014-04-02 DIAGNOSIS — Z7901 Long term (current) use of anticoagulants: Secondary | ICD-10-CM

## 2014-04-02 DIAGNOSIS — I2510 Atherosclerotic heart disease of native coronary artery without angina pectoris: Secondary | ICD-10-CM

## 2014-04-02 DIAGNOSIS — I10 Essential (primary) hypertension: Secondary | ICD-10-CM

## 2014-04-02 DIAGNOSIS — I25708 Atherosclerosis of coronary artery bypass graft(s), unspecified, with other forms of angina pectoris: Secondary | ICD-10-CM

## 2014-04-02 LAB — POCT INR: INR: 1.8

## 2014-04-23 ENCOUNTER — Ambulatory Visit (INDEPENDENT_AMBULATORY_CARE_PROVIDER_SITE_OTHER): Payer: Medicare Other | Admitting: *Deleted

## 2014-04-23 DIAGNOSIS — I2510 Atherosclerotic heart disease of native coronary artery without angina pectoris: Secondary | ICD-10-CM

## 2014-04-23 DIAGNOSIS — I4891 Unspecified atrial fibrillation: Secondary | ICD-10-CM

## 2014-04-23 DIAGNOSIS — I10 Essential (primary) hypertension: Secondary | ICD-10-CM

## 2014-04-23 DIAGNOSIS — Z5181 Encounter for therapeutic drug level monitoring: Secondary | ICD-10-CM

## 2014-04-23 DIAGNOSIS — Z7901 Long term (current) use of anticoagulants: Secondary | ICD-10-CM

## 2014-04-23 LAB — POCT INR: INR: 2.4

## 2014-05-21 ENCOUNTER — Ambulatory Visit (INDEPENDENT_AMBULATORY_CARE_PROVIDER_SITE_OTHER): Payer: Medicare Other | Admitting: *Deleted

## 2014-05-21 DIAGNOSIS — Z7901 Long term (current) use of anticoagulants: Secondary | ICD-10-CM | POA: Diagnosis not present

## 2014-05-21 DIAGNOSIS — Z5181 Encounter for therapeutic drug level monitoring: Secondary | ICD-10-CM

## 2014-05-21 DIAGNOSIS — I2510 Atherosclerotic heart disease of native coronary artery without angina pectoris: Secondary | ICD-10-CM

## 2014-05-21 DIAGNOSIS — I10 Essential (primary) hypertension: Secondary | ICD-10-CM | POA: Diagnosis not present

## 2014-05-21 DIAGNOSIS — I4891 Unspecified atrial fibrillation: Secondary | ICD-10-CM

## 2014-05-21 LAB — POCT INR: INR: 2.2

## 2014-06-25 ENCOUNTER — Encounter: Payer: Self-pay | Admitting: Cardiology

## 2014-06-25 ENCOUNTER — Ambulatory Visit (INDEPENDENT_AMBULATORY_CARE_PROVIDER_SITE_OTHER): Payer: Medicare Other

## 2014-06-25 ENCOUNTER — Ambulatory Visit (INDEPENDENT_AMBULATORY_CARE_PROVIDER_SITE_OTHER): Payer: Medicare Other | Admitting: Cardiology

## 2014-06-25 DIAGNOSIS — I482 Chronic atrial fibrillation, unspecified: Secondary | ICD-10-CM

## 2014-06-25 DIAGNOSIS — Z794 Long term (current) use of insulin: Secondary | ICD-10-CM

## 2014-06-25 DIAGNOSIS — Z5181 Encounter for therapeutic drug level monitoring: Secondary | ICD-10-CM

## 2014-06-25 DIAGNOSIS — H8110 Benign paroxysmal vertigo, unspecified ear: Secondary | ICD-10-CM | POA: Diagnosis not present

## 2014-06-25 DIAGNOSIS — E119 Type 2 diabetes mellitus without complications: Secondary | ICD-10-CM

## 2014-06-25 DIAGNOSIS — I2510 Atherosclerotic heart disease of native coronary artery without angina pectoris: Secondary | ICD-10-CM

## 2014-06-25 DIAGNOSIS — N400 Enlarged prostate without lower urinary tract symptoms: Secondary | ICD-10-CM | POA: Diagnosis not present

## 2014-06-25 DIAGNOSIS — I4891 Unspecified atrial fibrillation: Secondary | ICD-10-CM

## 2014-06-25 DIAGNOSIS — Z7901 Long term (current) use of anticoagulants: Secondary | ICD-10-CM | POA: Diagnosis not present

## 2014-06-25 DIAGNOSIS — I10 Essential (primary) hypertension: Secondary | ICD-10-CM

## 2014-06-25 LAB — POCT INR: INR: 2.5

## 2014-06-25 NOTE — Patient Instructions (Signed)
Continue your current therapy  We will refer you for vestibular therapy for vertigo  I will see you in 6 months.

## 2014-06-25 NOTE — Progress Notes (Signed)
[name] Date of Birth: [birth year]   History of Present Illness: [name] is seen for followup atrial fibrillation and CAD.  He  Has a history of chronic atrial fibrillation managed with rate control and anticoagulation with coumadin. He also has a history of CAD s/p CABG in 1990. His last stress Myoview was 2008. Echo in 2013 showed normal LV function. Very mild AS.  biatrial enlargement.He denies any significant palpitations,  chest pain, or shortness of breath. He is bothered by increased vertigo symptoms. He had vestibular PT in the past and this would help for 6 months but now symptoms are worse. This has limited his activity and he is no longer going to the gym.   Current Outpatient Prescriptions on File Prior to Visit  Medication Sig Dispense Refill  . AVODART 0.5 MG capsule Take 0.5 mg by mouth every other day.     . diltiazem (TIAZAC) 360 MG 24 hr capsule Take 1 capsule by mouth every other day.     [name] glucose blood (PRECISION XTRA TEST STRIPS) test strip TEST BLOOD SUGAR 3 TO 4 TIMES DAILY OR AS DIRECTED (DX: 250.00)    . insulin glargine (LANTUS) 100 UNIT/ML injection Inject 40 Units into the skin daily.      . Insulin Pen Needle (B-D ULTRAFINE III SHORT PEN) 31G X 8 MM MISC USE AS DIRECTED    . lisinopril (PRINIVIL,ZESTRIL) 40 MG tablet Take 40 mg by mouth daily.    . metFORMIN (GLUCOPHAGE) 500 MG tablet Take 500 mg by mouth 2 (two) times daily with a meal. 2 TABLETS IN THE MORNING, 2 tablets in the evening     . metoprolol succinate (TOPROL-XL) 100 MG 24 hr tablet Take 1 tablet by mouth daily.    . simvastatin (ZOCOR) 40 MG tablet Take 40 mg by mouth 2 (two) times daily.      . Tamsulosin HCl (FLOMAX) 0.4 MG CAPS Take 0.4 mg by mouth every other day.     . warfarin (COUMADIN) 5 MG tablet 1 tablet daily except 1.5 tablets on Wednesdays or as directed by coumadin clinic 120 tablet 1   No current facility-administered medications on file prior to visit.    Allergies   Allergen Reactions  . Amoxicillin   . Cefadroxil   . Ciprofloxacin   . Erythromycin     Past Medical History  Diagnosis Date  . CAD (coronary artery disease)     CABG IN 1990  . Mild aortic stenosis   . HTN (hypertension)   . Diabetes mellitus type 2, insulin dependent   . Hyperlipemia   . Bladder outlet obstruction   . BPH (benign prostatic hyperplasia)   . Elevated cholesterol   . Kidney stones   . Abdominal pain   . Constipation   . Bladder outlet obstruction   . Acute renal failure      resolved  . Urinary tract infection      Enterococcus  . Rib fractures     left rib fractures being treated with pain medications  . Arthritis   . Atrial fibrillation     Past Surgical History  Procedure Laterality Date  . Coronary artery bypass graft  1990  . Total hip arthroplasty  2000    RIGHT HIP  . Cardiac catheterization  2003  . Carpel tunnel  08/2010    right  . Cataract extraction, bilateral    . Lithotripsy      History  Smoking status  . Former  Smoker  . Types: Cigarettes  . Quit date: 05/11/1958  Smokeless tobacco  . Not on file    History  Alcohol Use No    Family History  Problem Relation Age of Onset  . Brain cancer Father   . Throat cancer Brother     Review of Systems: As noted in history of present illness.  All other systems were reviewed and are negative.  Physical Exam: There were no vitals taken for this visit. He is a pleasant elderly white male who is obese. HEENT exam is unremarkable. Sclera are clear. PERRLA. Oropharynx is clear. Neck is supple no JVD, adenopathy, thyromegaly, or bruits. Lungs are clear. Cardiac exam reveals a grade 1/6 systolic ejection murmur right upper sternal border. His pulse is irregular. Old median sternotomy scar. Abdomen is obese, soft, nontender. He has no edema. Pedal pulses are palpable. He is alert and oriented x3. Cranial nerves II through XII are intact. Skin is warm and dry.  LABORATORY DATA: Labs  reviewed from Dr. Jonni Sanger via Karen Chafe. Lipids are satisfactory. A1c 7.8%.   Assessment / Plan: 1. Chronic atrial fibrillation. Rate well controlled. On coumadin. INR 2.5. Asymptomatic. Continue current therapy  2. CAD s/p remote CABG 25 years ago. Asymptomatic. Since he is now 81 yo I do not plan on follow up stress testing unless he has symptoms.  3. Chronic anticoagulation- therapeutic.  4. HTN controlled.  5. DM type 2- on insulin  6. Hyperlipidemia.  7. Benign positional vertigo. Will refer for vestibular training with PT. Hopefully can improve to the point where he can resume his aerobic exercise.   I will follow up in 6 months.

## 2014-06-28 ENCOUNTER — Telehealth: Payer: Self-pay | Admitting: Cardiology

## 2014-06-28 DIAGNOSIS — H8110 Benign paroxysmal vertigo, unspecified ear: Secondary | ICD-10-CM

## 2014-06-28 NOTE — Telephone Encounter (Signed)
Pt says he was suppose to call you back, if he had not heard from you by today. He was suppose to be referred to a therapist.

## 2014-06-28 NOTE — Telephone Encounter (Signed)
Returned call to patient no answer.Left message on personal voice mail will check on physical therapy appointment and call you back.

## 2014-07-02 NOTE — Telephone Encounter (Signed)
Spoke to [place] about referral to PT for vestibular therapy.Received home health referral worksheet.Form completed and faxed back to fax # [phone].

## 2014-07-26 ENCOUNTER — Ambulatory Visit (INDEPENDENT_AMBULATORY_CARE_PROVIDER_SITE_OTHER): Payer: Medicare Other | Admitting: Pharmacist Clinician (PhC)/ Clinical Pharmacy Specialist

## 2014-07-26 DIAGNOSIS — Z5181 Encounter for therapeutic drug level monitoring: Secondary | ICD-10-CM

## 2014-07-26 DIAGNOSIS — I4891 Unspecified atrial fibrillation: Secondary | ICD-10-CM

## 2014-07-26 DIAGNOSIS — I2510 Atherosclerotic heart disease of native coronary artery without angina pectoris: Secondary | ICD-10-CM | POA: Diagnosis not present

## 2014-07-26 LAB — POCT INR: INR: 2.3

## 2014-08-16 ENCOUNTER — Other Ambulatory Visit: Payer: Self-pay | Admitting: *Deleted

## 2014-08-16 MED ORDER — WARFARIN SODIUM 5 MG PO TABS: ORAL_TABLET | ORAL | Status: DC

## 2014-08-23 ENCOUNTER — Ambulatory Visit (INDEPENDENT_AMBULATORY_CARE_PROVIDER_SITE_OTHER): Payer: Medicare Other | Admitting: Pharmacist

## 2014-08-23 DIAGNOSIS — Z5181 Encounter for therapeutic drug level monitoring: Secondary | ICD-10-CM

## 2014-08-23 DIAGNOSIS — I10 Essential (primary) hypertension: Secondary | ICD-10-CM | POA: Diagnosis not present

## 2014-08-23 DIAGNOSIS — I4891 Unspecified atrial fibrillation: Secondary | ICD-10-CM

## 2014-08-23 DIAGNOSIS — Z7901 Long term (current) use of anticoagulants: Secondary | ICD-10-CM | POA: Diagnosis not present

## 2014-08-23 DIAGNOSIS — I2510 Atherosclerotic heart disease of native coronary artery without angina pectoris: Secondary | ICD-10-CM | POA: Diagnosis not present

## 2014-08-23 LAB — POCT INR: INR: 2.6

## 2014-09-11 ENCOUNTER — Ambulatory Visit (INDEPENDENT_AMBULATORY_CARE_PROVIDER_SITE_OTHER): Payer: Medicare Other | Admitting: Podiatry

## 2014-09-11 ENCOUNTER — Encounter: Payer: Self-pay | Admitting: Podiatry

## 2014-09-11 VITALS — BP 129/86 | HR 109 | Resp 18

## 2014-09-11 DIAGNOSIS — M79676 Pain in unspecified toe(s): Secondary | ICD-10-CM

## 2014-09-11 DIAGNOSIS — M79675 Pain in left toe(s): Secondary | ICD-10-CM

## 2014-09-11 DIAGNOSIS — B351 Tinea unguium: Secondary | ICD-10-CM | POA: Diagnosis not present

## 2014-09-11 DIAGNOSIS — E119 Type 2 diabetes mellitus without complications: Secondary | ICD-10-CM

## 2014-09-11 DIAGNOSIS — M79674 Pain in right toe(s): Secondary | ICD-10-CM

## 2014-09-11 NOTE — Progress Notes (Signed)
Patient ID: [name], male   DOB: Jul 03, 1928, 81 y.o.   MRN: [id number] Complaint:  Visit Type: Patient returns to my office for continued preventative foot care services. Complaint: Patient states" my nails have grown long and thick and become painful to walk and wear shoes" Patient has been diagnosed with DM with no foot  complications. He presents for preventative foot care services. No changes to ROS  Podiatric Exam: Vascular: dorsalis pedis and posterior tibial pulses are palpable bilateral. Capillary return is immediate. Temperature gradient is WNL. Skin turgor WNL  Sensorium: Normal Semmes Weinstein monofilament test. Normal tactile sensation bilaterally. Nail Exam: Pt has thick disfigured discolored nails with subungual debris noted bilateral entire nail hallux through fifth toenails Ulcer Exam: There is no evidence of ulcer or pre-ulcerative changes or infection. Orthopedic Exam: Muscle tone and strength are WNL. No limitations in general ROM. No crepitus or effusions noted. Foot type and digits show no abnormalities. Bony prominences are unremarkable. Skin: No Porokeratosis. No infection or ulcers  Diagnosis:  Tinea unguium, Pain in right toe, pain in left toes  Treatment & Plan Procedures and Treatment: Consent by patient was obtained for treatment procedures. The patient understood the discussion of treatment and procedures well. All questions were answered thoroughly reviewed. Debridement of mycotic and hypertrophic toenails, 1 through 5 bilateral and clearing of subungual debris. No ulceration, no infection noted.  Return Visit-Office Procedure: Patient instructed to return to the office for a follow up visit 3 months for continued evaluation and treatment.

## 2014-10-04 ENCOUNTER — Ambulatory Visit (INDEPENDENT_AMBULATORY_CARE_PROVIDER_SITE_OTHER): Payer: Medicare Other | Admitting: Pharmacist Clinician (PhC)/ Clinical Pharmacy Specialist

## 2014-10-04 DIAGNOSIS — Z7901 Long term (current) use of anticoagulants: Secondary | ICD-10-CM | POA: Diagnosis not present

## 2014-10-04 DIAGNOSIS — I10 Essential (primary) hypertension: Secondary | ICD-10-CM | POA: Diagnosis not present

## 2014-10-04 DIAGNOSIS — I4891 Unspecified atrial fibrillation: Secondary | ICD-10-CM | POA: Diagnosis not present

## 2014-10-04 DIAGNOSIS — I2510 Atherosclerotic heart disease of native coronary artery without angina pectoris: Secondary | ICD-10-CM

## 2014-10-04 DIAGNOSIS — Z5181 Encounter for therapeutic drug level monitoring: Secondary | ICD-10-CM

## 2014-10-04 LAB — POCT INR: INR: 2.3

## 2014-11-15 ENCOUNTER — Ambulatory Visit (INDEPENDENT_AMBULATORY_CARE_PROVIDER_SITE_OTHER): Payer: Medicare Other | Admitting: Pharmacist Clinician (PhC)/ Clinical Pharmacy Specialist

## 2014-11-15 DIAGNOSIS — I10 Essential (primary) hypertension: Secondary | ICD-10-CM | POA: Diagnosis not present

## 2014-11-15 DIAGNOSIS — Z5181 Encounter for therapeutic drug level monitoring: Secondary | ICD-10-CM

## 2014-11-15 DIAGNOSIS — Z7901 Long term (current) use of anticoagulants: Secondary | ICD-10-CM

## 2014-11-15 DIAGNOSIS — I4891 Unspecified atrial fibrillation: Secondary | ICD-10-CM

## 2014-11-15 DIAGNOSIS — I2510 Atherosclerotic heart disease of native coronary artery without angina pectoris: Secondary | ICD-10-CM | POA: Diagnosis not present

## 2014-11-15 LAB — POCT INR: INR: 1.9

## 2014-12-02 ENCOUNTER — Encounter: Payer: Self-pay | Admitting: Cardiology

## 2014-12-04 ENCOUNTER — Encounter: Payer: Self-pay | Admitting: Podiatry

## 2014-12-04 ENCOUNTER — Ambulatory Visit (INDEPENDENT_AMBULATORY_CARE_PROVIDER_SITE_OTHER): Payer: Medicare Other | Admitting: Podiatry

## 2014-12-04 DIAGNOSIS — M79676 Pain in unspecified toe(s): Secondary | ICD-10-CM | POA: Diagnosis not present

## 2014-12-04 DIAGNOSIS — M79674 Pain in right toe(s): Secondary | ICD-10-CM

## 2014-12-04 DIAGNOSIS — B351 Tinea unguium: Secondary | ICD-10-CM

## 2014-12-04 DIAGNOSIS — E119 Type 2 diabetes mellitus without complications: Secondary | ICD-10-CM

## 2014-12-04 DIAGNOSIS — M79675 Pain in left toe(s): Secondary | ICD-10-CM | POA: Diagnosis not present

## 2014-12-04 NOTE — Progress Notes (Signed)
Patient ID: [name], male   DOB: 05/31/1928, 81 y.o.   MRN: [id number] Complaint:  Visit Type: Patient returns to my office for continued preventative foot care services. Complaint: Patient states" my nails have grown long and thick and become painful to walk and wear shoes" Patient has been diagnosed with DM with no foot  complications. He presents for preventative foot care services. No changes to ROS  Podiatric Exam: Vascular: dorsalis pedis and posterior tibial pulses are palpable bilateral. Capillary return is immediate. Temperature gradient is WNL. Skin turgor WNL  Sensorium: Normal Semmes Weinstein monofilament test. Normal tactile sensation bilaterally. Nail Exam: Pt has thick disfigured discolored nails with subungual debris noted bilateral entire nail hallux through fifth toenails Ulcer Exam: There is no evidence of ulcer or pre-ulcerative changes or infection. Orthopedic Exam: Muscle tone and strength are WNL. No limitations in general ROM. No crepitus or effusions noted. Foot type and digits show no abnormalities. Bony prominences are unremarkable. Skin: No Porokeratosis. No infection or ulcers  Diagnosis:  Tinea unguium, Pain in right toe, pain in left toes  Treatment & Plan Procedures and Treatment: Consent by patient was obtained for treatment procedures. The patient understood the discussion of treatment and procedures well. All questions were answered thoroughly reviewed. Debridement of mycotic and hypertrophic toenails, 1 through 5 bilateral and clearing of subungual debris. No ulceration, no infection noted.  Return Visit-Office Procedure: Patient instructed to return to the office for a follow up visit 3 months for continued evaluation and treatment. 

## 2014-12-19 ENCOUNTER — Telehealth: Payer: Self-pay

## 2014-12-19 ENCOUNTER — Ambulatory Visit: Payer: Medicare Other

## 2014-12-19 ENCOUNTER — Ambulatory Visit (INDEPENDENT_AMBULATORY_CARE_PROVIDER_SITE_OTHER): Payer: Medicare Other | Admitting: Pharmacist Clinician (PhC)/ Clinical Pharmacy Specialist

## 2014-12-19 ENCOUNTER — Ambulatory Visit: Payer: Medicare Other | Admitting: Pharmacist Clinician (PhC)/ Clinical Pharmacy Specialist

## 2014-12-19 ENCOUNTER — Other Ambulatory Visit: Payer: Self-pay

## 2014-12-19 ENCOUNTER — Ambulatory Visit (INDEPENDENT_AMBULATORY_CARE_PROVIDER_SITE_OTHER): Payer: Medicare Other | Admitting: Cardiology

## 2014-12-19 ENCOUNTER — Encounter: Payer: Self-pay | Admitting: Cardiology

## 2014-12-19 VITALS — BP 142/70 | HR 90 | Ht 66.0 in | Wt 225.2 lb

## 2014-12-19 DIAGNOSIS — Z7901 Long term (current) use of anticoagulants: Secondary | ICD-10-CM | POA: Diagnosis not present

## 2014-12-19 DIAGNOSIS — I482 Chronic atrial fibrillation, unspecified: Secondary | ICD-10-CM

## 2014-12-19 DIAGNOSIS — I4891 Unspecified atrial fibrillation: Secondary | ICD-10-CM | POA: Diagnosis not present

## 2014-12-19 DIAGNOSIS — I25709 Atherosclerosis of coronary artery bypass graft(s), unspecified, with unspecified angina pectoris: Secondary | ICD-10-CM | POA: Diagnosis not present

## 2014-12-19 DIAGNOSIS — I10 Essential (primary) hypertension: Secondary | ICD-10-CM

## 2014-12-19 DIAGNOSIS — I2510 Atherosclerotic heart disease of native coronary artery without angina pectoris: Secondary | ICD-10-CM | POA: Diagnosis not present

## 2014-12-19 DIAGNOSIS — Z5181 Encounter for therapeutic drug level monitoring: Secondary | ICD-10-CM

## 2014-12-19 DIAGNOSIS — E785 Hyperlipidemia, unspecified: Secondary | ICD-10-CM

## 2014-12-19 LAB — POCT INR: INR: 1.4

## 2014-12-19 NOTE — Progress Notes (Signed)
[name] Date of Birth: [birth year]   History of Present Illness: [name] is seen for followup atrial fibrillation and CAD.  He  Has a history of chronic atrial fibrillation managed with rate control and anticoagulation with coumadin. He also has a history of CAD s/p CABG in 1990. His last stress Myoview was 2012. Echo in 2013 showed normal LV function. Very mild AS.  biatrial enlargement. On follow up today he does report increased symptoms of dyspnea on exertion. This was particularly bad in hot weather. Denies any chest pain.He denies any significant palpitations.. He complains about increased vertigo symptoms. He had vestibular PT in the past and this would help for 6 months but now symptoms are back. Because of his SOB he has significantly reduced his activity.  Current Outpatient Prescriptions on File Prior to Visit  Medication Sig Dispense Refill  . AVODART 0.5 MG capsule Take 0.5 mg by mouth every other day.     . diltiazem (TIAZAC) 360 MG 24 hr capsule Take 1 capsule by mouth every other day.     . diltiazem (TIAZAC) 360 MG 24 hr capsule Take 360 mg by mouth.    [name] glucose blood (PRECISION XTRA TEST STRIPS) test strip TEST BLOOD SUGAR 3 TO 4 TIMES DAILY OR AS DIRECTED (DX: 250.00)    . hydrochlorothiazide (MICROZIDE) 12.5 MG capsule Take 12.5 mg by mouth.    . insulin glargine (LANTUS) 100 UNIT/ML injection Inject 32 Units into the skin daily.     . Insulin Pen Needle (B-D ULTRAFINE III SHORT PEN) 31G X 8 MM MISC USE AS DIRECTED BY PHYSICIAN    . lisinopril (PRINIVIL,ZESTRIL) 40 MG tablet Take 40 mg by mouth daily.    . metFORMIN (GLUCOPHAGE) 500 MG tablet Take 1,000 mg by mouth 2 (two) times daily with a meal. 1 TABLETS IN THE MORNING, 1 tablets in the evening    . metoprolol succinate (TOPROL-XL) 100 MG 24 hr tablet Take 1 tablet by mouth daily.    . simvastatin (ZOCOR) 40 MG tablet Take 20 mg by mouth daily.     . Tamsulosin HCl (FLOMAX) 0.4 MG CAPS Take 0.4 mg by mouth  every other day.     . warfarin (COUMADIN) 5 MG tablet 1 tablet daily except 1.5 tablets on Wednesdays or as directed by coumadin clinic 120 tablet 1   No current facility-administered medications on file prior to visit.    Allergies  Allergen Reactions  . Amoxicillin   . Cefadroxil   . Ciprofloxacin   . Erythromycin     Past Medical History  Diagnosis Date  . CAD (coronary artery disease)     CABG IN 1990  . Mild aortic stenosis   . HTN (hypertension)   . Diabetes mellitus type 2, insulin dependent ([place])   . Hyperlipemia   . Bladder outlet obstruction   . BPH (benign prostatic hyperplasia)   . Elevated cholesterol   . Kidney stones   . Abdominal pain   . Constipation   . Bladder outlet obstruction   . Acute renal failure ([place])      resolved  . Urinary tract infection      Enterococcus  . Rib fractures     left rib fractures being treated with pain medications  . Arthritis   . Atrial fibrillation [facility])     Past Surgical History  Procedure Laterality Date  . Coronary artery bypass graft  1990  . Total hip arthroplasty  2000  RIGHT HIP  . Cardiac catheterization  2003  . Carpel tunnel  08/2010    right  . Cataract extraction, bilateral    . Lithotripsy      History  Smoking status  . Former Smoker  . Types: Cigarettes  . Quit date: 05/11/1958  Smokeless tobacco  . Not on file    History  Alcohol Use No    Family History  Problem Relation Age of Onset  . Brain cancer Father   . Throat cancer Brother     Review of Systems: As noted in history of present illness.  All other systems were reviewed and are negative.  Physical Exam: BP 142/70 mmHg  Pulse 90  Ht 5\' 6"  (1.676 m)  Wt 102.15 kg (225 lb 3.2 oz)  BMI 36.37 kg/m2 He is a pleasant elderly white male who is obese. HEENT exam is unremarkable. Sclera are clear. PERRLA. Oropharynx is clear. Neck is supple no JVD, adenopathy, thyromegaly, or bruits. Lungs are clear. Cardiac exam reveals a  grade 1/6 systolic ejection murmur right upper sternal border. His pulse is irregular. Old median sternotomy scar. Abdomen is obese, soft, nontender. He has no edema. Pedal pulses are palpable. He is alert and oriented x3. Cranial nerves II through XII are intact. Skin is warm and dry.  LABORATORY DATA: Labs reviewed from Dr. Jonni Sanger- last A1c 8.4%. Last lipid panel in Jan 2016 cholesterol 160, triglycerides 113, HDL 57, LDL 87.  Ecg today shows atrial fibrillation with rate 90. Nonspecific TWA. I have personally reviewed and interpreted this study.    Assessment / Plan: 1. Chronic atrial fibrillation. Rate well controlled. On coumadin. INR 21.4. Dose adjusted by pharmacy.  Asymptomatic. Continue current therapy  2. CAD s/p remote CABG 26 years ago. He is experiencing increased dyspnea on exertion that may be anginal equivalent. Will schedule for [employer].   3. Chronic anticoagulation  4. HTN controlled.  5. DM type 2- on insulin  6. Hyperlipidemia.  7. Benign positional vertigo. Will refer again for vestibular training with PT.    I will follow up in 6 months.

## 2014-12-19 NOTE — Telephone Encounter (Signed)
[facility] called order faxed to fax # [phone] for patient to receive Home Vestibular Therapy for positional dizziness.They will contact patient.

## 2014-12-19 NOTE — Patient Instructions (Signed)
We will schedule you for a nuclear stress test  We will arrange home health for vestibular training  I will see you in 6 months.

## 2014-12-24 ENCOUNTER — Telehealth: Payer: Self-pay | Admitting: Cardiology

## 2014-12-24 DIAGNOSIS — H8110 Benign paroxysmal vertigo, unspecified ear: Secondary | ICD-10-CM

## 2014-12-24 NOTE — Telephone Encounter (Signed)
Returned call to [place] [facility].He stated since pt drives he does not qualify for home vestibular therapy.Order entered for out patient vestibular therapy.PT will contact patient.

## 2014-12-24 NOTE — Telephone Encounter (Signed)
[name] called in stating that the pt may not qualify for home health b/c he is able to drive. [name] suggested that Dr. Martinique look into the Vestibular rehab program for the pt. He says that the number is [phone] and the fax is [phone]. F/u with the pt after this option has been reviewed.   Thanks

## 2014-12-25 ENCOUNTER — Telehealth (HOSPITAL_COMMUNITY): Payer: Self-pay

## 2014-12-25 NOTE — Telephone Encounter (Signed)
Encounter complete. 

## 2014-12-27 ENCOUNTER — Ambulatory Visit (HOSPITAL_COMMUNITY)
Admission: RE | Admit: 2014-12-27 | Discharge: 2014-12-27 | Disposition: A | Discharge status: Home / Self Care | Payer: Medicare Other | Source: Ambulatory Visit | Attending: Cardiovascular Disease | Admitting: Cardiovascular Disease

## 2014-12-27 DIAGNOSIS — I25709 Atherosclerosis of coronary artery bypass graft(s), unspecified, with unspecified angina pectoris: Secondary | ICD-10-CM | POA: Diagnosis not present

## 2014-12-27 DIAGNOSIS — Z7901 Long term (current) use of anticoagulants: Secondary | ICD-10-CM | POA: Diagnosis not present

## 2014-12-27 DIAGNOSIS — R9439 Abnormal result of other cardiovascular function study: Secondary | ICD-10-CM | POA: Insufficient documentation

## 2014-12-27 DIAGNOSIS — I10 Essential (primary) hypertension: Secondary | ICD-10-CM | POA: Diagnosis not present

## 2014-12-27 DIAGNOSIS — E785 Hyperlipidemia, unspecified: Secondary | ICD-10-CM | POA: Insufficient documentation

## 2014-12-27 DIAGNOSIS — I482 Chronic atrial fibrillation, unspecified: Secondary | ICD-10-CM

## 2014-12-27 LAB — MYOCARDIAL PERFUSION IMAGING
Peak HR: 133 {beats}/min
Rest HR: 93 {beats}/min
SDS: 9
SRS: 2
SSS: 10
TID: 1.2

## 2014-12-27 MED ORDER — REGADENOSON 0.4 MG/5ML IV SOLN
0.4000 mg | Freq: Once | INTRAVENOUS | Status: AC
  Administered 2014-12-27: 0.4 mg via INTRAVENOUS

## 2014-12-27 MED ORDER — TECHNETIUM TC 99M SESTAMIBI GENERIC - CARDIOLITE
30.1000 | Freq: Once | INTRAVENOUS | Status: AC | PRN
  Administered 2014-12-27: 30.1 via INTRAVENOUS

## 2014-12-27 MED ORDER — TECHNETIUM TC 99M SESTAMIBI GENERIC - CARDIOLITE
10.7000 | Freq: Once | INTRAVENOUS | Status: AC | PRN
  Administered 2014-12-27: 11 via INTRAVENOUS

## 2014-12-30 ENCOUNTER — Telehealth: Payer: Self-pay | Admitting: Cardiology

## 2014-12-30 DIAGNOSIS — R9439 Abnormal result of other cardiovascular function study: Secondary | ICD-10-CM

## 2014-12-30 DIAGNOSIS — I350 Nonrheumatic aortic (valve) stenosis: Secondary | ICD-10-CM

## 2014-12-30 NOTE — Telephone Encounter (Signed)
Returned call to patient.Myoview results given.Cardiac cath scheduled with Dr.Jordan 01/08/15.Stated sob with exertion seems better. Advised to start holding Coumadin 01/03/15, 5 days prior to cath.Patient will pick up cath instructions and have pre cath lab work,cxr 01/06/15.Vestibular therapy scheduled at outpatient Neuro rehab 01/03/15 at 10:15 am arrive at 9:45 am.

## 2014-12-30 NOTE — Telephone Encounter (Signed)
New problem ° ° °Pt returning your call. °

## 2014-12-31 DIAGNOSIS — Z955 Presence of coronary angioplasty implant and graft: Secondary | ICD-10-CM

## 2014-12-31 HISTORY — DX: Presence of coronary angioplasty implant and graft: Z95.5

## 2015-01-03 ENCOUNTER — Ambulatory Visit (INDEPENDENT_AMBULATORY_CARE_PROVIDER_SITE_OTHER): Payer: Medicare Other | Admitting: Pharmacist Clinician (PhC)/ Clinical Pharmacy Specialist

## 2015-01-03 ENCOUNTER — Ambulatory Visit: Payer: Medicare Other | Attending: Family Medicine

## 2015-01-03 DIAGNOSIS — R269 Unspecified abnormalities of gait and mobility: Secondary | ICD-10-CM | POA: Diagnosis present

## 2015-01-03 DIAGNOSIS — I2510 Atherosclerotic heart disease of native coronary artery without angina pectoris: Secondary | ICD-10-CM

## 2015-01-03 DIAGNOSIS — I10 Essential (primary) hypertension: Secondary | ICD-10-CM | POA: Diagnosis not present

## 2015-01-03 DIAGNOSIS — Z5181 Encounter for therapeutic drug level monitoring: Secondary | ICD-10-CM | POA: Diagnosis not present

## 2015-01-03 DIAGNOSIS — I4891 Unspecified atrial fibrillation: Secondary | ICD-10-CM

## 2015-01-03 DIAGNOSIS — Z7901 Long term (current) use of anticoagulants: Secondary | ICD-10-CM

## 2015-01-03 DIAGNOSIS — R42 Dizziness and giddiness: Secondary | ICD-10-CM | POA: Insufficient documentation

## 2015-01-03 LAB — POCT INR: INR: 2.1

## 2015-01-03 NOTE — Therapy (Signed)
[facility] [address] [place], [place], [address] Phone: [phone]   Fax:  [phone]  Physical Therapy Evaluation  Patient Details  Name: [name] MRN: [id number] Date of Birth: [birth year] Referring Provider: Dr. Billey Chang  Encounter Date: 01/03/2015      PT End of Session - 01/03/15 1040    Visit Number 1   Number of Visits 4   Date for PT Re-Evaluation 02/02/15   Authorization Type G-code every 10th visit   PT Start Time 0945   PT Stop Time 1026   PT Time Calculation (min) 41 min   Activity Tolerance Patient tolerated treatment well   Behavior During Therapy [facility] for tasks assessed/performed      Past Medical History  Diagnosis Date  . CAD (coronary artery disease)     CABG IN 1990  . Mild aortic stenosis   . HTN (hypertension)   . Diabetes mellitus type 2, insulin dependent ([place])   . Hyperlipemia   . Bladder outlet obstruction   . BPH (benign prostatic hyperplasia)   . Elevated cholesterol   . Kidney stones   . Abdominal pain   . Constipation   . Bladder outlet obstruction   . Acute renal failure ([place])      resolved  . Urinary tract infection      Enterococcus  . Rib fractures     left rib fractures being treated with pain medications  . Arthritis   . Atrial fibrillation [facility])     Past Surgical History  Procedure Laterality Date  . Coronary artery bypass graft  1990  . Total hip arthroplasty  2000    RIGHT HIP  . Cardiac catheterization  2003  . Carpel tunnel  08/2010    right  . Cataract extraction, bilateral    . Lithotripsy      There were no vitals filed for this visit.  Visit Diagnosis:  Dizziness and giddiness - Plan: PT plan of care cert/re-cert  Abnormality of gait - Plan: PT plan of care cert/re-cert      Subjective Assessment - 01/03/15 0949    Subjective Pt reported vertigo comes and goes, he has an episode every 6 months. Pt decribes dizziness as  spinning that lasts < 70minute but can feel woozy afterwards for half-full day. Pt reports looking up, getting OOB, bending worse, rolling in bed all  makes it worse.  Pt's wife Webb Silversmith present.  Pt reports worst dizziness 9-10/10, and reports he always feels "some" dizziness, at its best: 1/10.   Patient is accompained by: Family member  Anne-pt's wife   Patient Stated Goals to get rid of the dizziness    Currently in Pain? No/denies            [facility] PT Assessment - 01/03/15 0953    Assessment   Medical Diagnosis BPPV   Referring Provider Dr. Billey Chang   Onset Date/Surgical Date 12/27/14   Prior Therapy HHPT for vertigo approx. 6 months ago   Precautions   Precautions Fall   Restrictions   Weight Bearing Restrictions No   Balance Screen   Has the patient fallen in the past 6 months No   Has the patient had a decrease in activity level because of a fear of falling?  Yes   Is the patient reluctant to leave their home because of a fear of falling?  No   Home Ecologist residence   Living Arrangements Spouse/significant other  Available Help at Discharge Family   Type of [place] Access Level entry   [place] Two level;Able to live on main level with bedroom/bathroom   Alternate Level Stairs-Number of Steps 12   Alternate Level Stairs-Rails Right   Home Equipment Cane - single point   Prior Function   Level of Independence Independent   Observation/Other Assessments   Focus on Therapeutic Outcomes (FOTO)  DHI: 40   Ambulation/Gait   Ambulation/Gait Yes   Ambulation/Gait Assistance 5: Supervision   Ambulation/Gait Assistance Details Pt amb. in guarded manner   Ambulation Distance (Feet) 100 Feet   Assistive device None   Gait Pattern Step-through pattern;Decreased stride length;Decreased trunk rotation  posterior pelvic tilt   Ambulation Surface Level;Indoor   Gait velocity 2.9ft/sec.   Standardized Balance Assessment    Standardized Balance Assessment Timed Up and Go Test   Timed Up and Go Test   TUG Normal TUG   Normal TUG (seconds) 11.7            Vestibular Assessment - 01/03/15 0001    Vestibular Assessment   General Observation Pt reports he has been experiencing vertigo intermittently for approx. 4 years with no known injury. Pt reports he feels better after treatment and then dizziness comes back about 6 months after treatment.   Symptom Behavior   Type of Dizziness Spinning  unbalanced after dizziness, and sometimes lightheaded   Frequency of Dizziness Every day   Duration of Dizziness less than a minute  with "lightheadedness/unbalanced" feeling lasting 1 day.   Aggravating Factors Looking up to the ceiling;Rolling to left;Rolling to right;Forward bending;Supine to sit;Turning head quickly   Relieving Factors Slow movements;Comments;Rest  sitting down   Occulomotor Exam   Occulomotor Alignment Normal   Spontaneous Absent   Gaze-induced Absent   Smooth Pursuits Intact  L side provoked dizziness: 2-3/10.   Saccades Intact;Slow   Comment R saccades intact and no dizziness, L saccades slow and produced 2-3/10 dizziness.   Vestibulo-Occular Reflex   VOR 1 Head Only (x 1 viewing) 3/10 dizziness.   Comment B head thrust guarded but no dizziness.    Positional Testing   Dix-Hallpike Dix-Hallpike Right;Dix-Hallpike Left   Horizontal Canal Testing Horizontal Canal Right;Horizontal Canal Left   Dix-Hallpike Right   Dix-Hallpike Right Duration none   Dix-Hallpike Right Symptoms No nystagmus   Dix-Hallpike Left   Dix-Hallpike Left Duration <30 seconds of 6/10 dizziness   Dix-Hallpike Left Symptoms No nystagmus   Horizontal Canal Right   Horizontal Canal Right Duration none   Horizontal Canal Right Symptoms Normal   Horizontal Canal Left   Horizontal Canal Left Duration none   Horizontal Canal Left Symptoms Normal   Positional Sensitivities   Supine to Sitting Mild dizziness                 Vestibular Treatment/Exercise - 01/03/15 0001    Vestibular Treatment/Exercise   Vestibular Treatment Provided Canalith Repositioning   Canalith Repositioning Epley Manuever Left    EPLEY MANUEVER LEFT   Number of Reps  1   Overall Response  Improved Symptoms    RESPONSE DETAILS LEFT Pt reported dizziness decreased to 0/10 after treatment. No nystagmus noted, pt did reported dizziness during 1st and 2nd positions.               PT Education - 01/03/15 1039    Education provided Yes   Education Details PT discussed frequency/duration. PT discussed BPPV and improving vestibular input.  Person(s) Educated Patient;Spouse   Methods Explanation   Comprehension Verbalized understanding          PT Short Term Goals - Jan 13, 2015 1046    PT SHORT TERM GOAL #1   Title Same as LTGs           PT Long Term Goals - 01-13-2015 1046    PT LONG TERM GOAL #1   Title Pt will report 0/10 dizziness while while looking up, bending forward, turning head, rolling in bed, in order to perform ADLs safely. Target date: 01/31/15.   Status New   PT LONG TERM GOAL #2   Title Perform FGA and write goal. [place] date: 01/31/15.   Status New   PT LONG TERM GOAL #3   Title Pt will amb. 600' over even/uneven terrain at MOD I level to improve functional mobility. Target date: 01/31/15.   Status New   PT LONG TERM GOAL #4   Title Pt will be IND in HEP to improve dizziness, strength, flexibility, and endurance. Target date: 01/31/15.   Status New   PT LONG TERM GOAL #5   Title Pt will improve DHI score from 40 to 22 in order to improve quality of life. Target date: 01/31/15.   Status New               Plan - January 13, 2015 1041    Clinical Impression Statement Pt is a pleasant 81y/o male presenting to OPPT neuro for BPPV per MD order. Pt's symptoms consistent with L BPPV although no nystagmus was noted, but pt reported dizziness during L Dix-Hallpike, with no dizziness after L  Epley treatment. Pt also presented with dizziness, gait deviations, limited endurance, decreased cervical ROM (all directions), and decr. strength based on gait deviations. PT is schedule for a heart cath on 01/08/15, and will ask MD if pt has any precautions after procedure. Pt also describes dizziness when transferring from supine to sit, therefore, PT will test for orthostatic hypotension next session.    Pt will benefit from skilled therapeutic intervention in order to improve on the following deficits Abnormal gait;Decreased endurance;Postural dysfunction;Impaired flexibility;Decreased range of motion;Dizziness;Decreased mobility;Decreased balance;Decreased strength   Rehab Potential Good   Clinical Impairments Affecting Rehab Potential Pt has a heart cath procedure scheduled on 01/08/15   PT Frequency 1x / week   PT Duration 4 weeks   PT Treatment/Interventions ADLs/Self Care Home Management;Biofeedback;Canalith Repostioning;Balance training;Therapeutic exercise;Manual techniques;Vestibular;Therapeutic activities;Functional mobility training;Gait training;Stair training;DME Instruction;Patient/family education;Neuromuscular re-education   PT Next Visit Plan Assess for orthostatic hypotension, perform L Dix-Hallpike prn, perform FGA, and provide balance and gaze stabilization HEP to improve vestibular input.   Consulted and Agree with Plan of Care Patient;Family member/caregiver   Family Member Consulted pt's wife: Carolin Coy - January 13, 2015 1049    Functional Assessment Tool Used DHI: 40, no current HEP   Functional Limitation Self care   Self Care Current Status [phone]) At least 40 percent but less than 60 percent impaired, limited or restricted   Self Care Goal Status (G3875) At least 1 percent but less than 20 percent impaired, limited or restricted       Problem List Patient Active Problem List   Diagnosis Date Noted  . Vertigo, benign positional 06/25/2014  . Encounter for  therapeutic drug monitoring 04/13/2013  . A-fib ([place]) 09/09/2011  . Chronic anticoagulation 09/09/2011  . Atrial fibrillation ([place]) 07/30/2011  . CAD (coronary artery disease)   . Mild aortic  stenosis   . HTN (hypertension)   . Diabetes mellitus type 2, insulin dependent ([place])   . Hyperlipemia   . BPH (benign prostatic hyperplasia)     Koree Staheli L 01/03/2015, 10:50 AM  [facility] [address] [place], [place], [address] Phone: [phone]   Fax:  [phone]  Name: Gabriel Conry MRN: [id number] Date of Birth: [birth year]   Geoffry Paradise, PT,DPT 01/03/2015 10:50 AM Phone: [phone] Fax: [phone]

## 2015-01-06 ENCOUNTER — Other Ambulatory Visit: Payer: Self-pay

## 2015-01-06 ENCOUNTER — Other Ambulatory Visit (INDEPENDENT_AMBULATORY_CARE_PROVIDER_SITE_OTHER): Payer: Medicare Other | Admitting: *Deleted

## 2015-01-06 ENCOUNTER — Ambulatory Visit
Admission: RE | Admit: 2015-01-06 | Discharge: 2015-01-06 | Disposition: A | Discharge status: Home / Self Care | Payer: Medicare Other | Source: Ambulatory Visit | Attending: Cardiology | Admitting: Cardiology

## 2015-01-06 DIAGNOSIS — Z7901 Long term (current) use of anticoagulants: Secondary | ICD-10-CM

## 2015-01-06 DIAGNOSIS — Z01812 Encounter for preprocedural laboratory examination: Secondary | ICD-10-CM

## 2015-01-06 DIAGNOSIS — I350 Nonrheumatic aortic (valve) stenosis: Secondary | ICD-10-CM

## 2015-01-06 DIAGNOSIS — R9439 Abnormal result of other cardiovascular function study: Secondary | ICD-10-CM

## 2015-01-06 DIAGNOSIS — R931 Abnormal findings on diagnostic imaging of heart and coronary circulation: Secondary | ICD-10-CM

## 2015-01-06 LAB — CBC WITH DIFFERENTIAL/PLATELET
Basophils Absolute: 0.1 10*3/uL (ref 0.0–0.1)
Basophils Relative: 1 % (ref 0–1)
Eosinophils Absolute: 0.2 10*3/uL (ref 0.0–0.7)
Eosinophils Relative: 2 % (ref 0–5)
HCT: 40.8 % (ref 39.0–52.0)
Hemoglobin: 13.5 g/dL (ref 13.0–17.0)
Lymphocytes Relative: 17 % (ref 12–46)
Lymphs Abs: 1.6 10*3/uL (ref 0.7–4.0)
MCH: 28.9 pg (ref 26.0–34.0)
MCHC: 33.1 g/dL (ref 30.0–36.0)
MCV: 87.4 fL (ref 78.0–100.0)
MPV: 10.6 fL (ref 8.6–12.4)
Monocytes Absolute: 0.8 10*3/uL (ref 0.1–1.0)
Monocytes Relative: 8 % (ref 3–12)
Neutro Abs: 6.8 10*3/uL (ref 1.7–7.7)
Neutrophils Relative %: 72 % (ref 43–77)
Platelets: 240 10*3/uL (ref 150–400)
RBC: 4.67 MIL/uL (ref 4.22–5.81)
RDW: 14.1 % (ref 11.5–15.5)
WBC: 9.4 10*3/uL (ref 4.0–10.5)

## 2015-01-06 LAB — BASIC METABOLIC PANEL
BUN: 22 mg/dL (ref 7–25)
CO2: 26 mmol/L (ref 20–31)
Calcium: 9.1 mg/dL (ref 8.6–10.3)
Chloride: 103 mmol/L (ref 98–110)
Creat: 1.27 mg/dL — ABNORMAL HIGH (ref 0.70–1.11)
Glucose, Bld: 259 mg/dL — ABNORMAL HIGH (ref 65–99)
Potassium: 4.7 mmol/L (ref 3.5–5.3)
Sodium: 139 mmol/L (ref 135–146)

## 2015-01-06 LAB — PROTIME-INR
INR: 1.15 (ref <1.50)
Prothrombin Time: 14.8 seconds (ref 11.6–15.2)

## 2015-01-08 ENCOUNTER — Ambulatory Visit (HOSPITAL_BASED_OUTPATIENT_CLINIC_OR_DEPARTMENT_OTHER): Payer: Medicare Other

## 2015-01-08 ENCOUNTER — Encounter (HOSPITAL_COMMUNITY): Payer: Self-pay | Admitting: Cardiology

## 2015-01-08 ENCOUNTER — Encounter (HOSPITAL_COMMUNITY)
Admission: RE | Disposition: A | Discharge status: Home / Self Care | Payer: Medicare Other | Source: Ambulatory Visit | Attending: Cardiology

## 2015-01-08 ENCOUNTER — Ambulatory Visit (HOSPITAL_COMMUNITY)
Admission: RE | Admit: 2015-01-08 | Discharge: 2015-01-09 | Disposition: A | Discharge status: Home / Self Care | Payer: Medicare Other | Source: Ambulatory Visit | Attending: Cardiology | Admitting: Cardiology

## 2015-01-08 DIAGNOSIS — E119 Type 2 diabetes mellitus without complications: Secondary | ICD-10-CM | POA: Diagnosis not present

## 2015-01-08 DIAGNOSIS — Z7901 Long term (current) use of anticoagulants: Secondary | ICD-10-CM | POA: Insufficient documentation

## 2015-01-08 DIAGNOSIS — H8110 Benign paroxysmal vertigo, unspecified ear: Secondary | ICD-10-CM | POA: Insufficient documentation

## 2015-01-08 DIAGNOSIS — Z79899 Other long term (current) drug therapy: Secondary | ICD-10-CM | POA: Insufficient documentation

## 2015-01-08 DIAGNOSIS — I152 Hypertension secondary to endocrine disorders: Secondary | ICD-10-CM | POA: Diagnosis present

## 2015-01-08 DIAGNOSIS — I482 Chronic atrial fibrillation, unspecified: Secondary | ICD-10-CM | POA: Diagnosis present

## 2015-01-08 DIAGNOSIS — R9439 Abnormal result of other cardiovascular function study: Secondary | ICD-10-CM | POA: Diagnosis present

## 2015-01-08 DIAGNOSIS — I2582 Chronic total occlusion of coronary artery: Secondary | ICD-10-CM | POA: Insufficient documentation

## 2015-01-08 DIAGNOSIS — Z794 Long term (current) use of insulin: Secondary | ICD-10-CM

## 2015-01-08 DIAGNOSIS — Z7984 Long term (current) use of oral hypoglycemic drugs: Secondary | ICD-10-CM | POA: Diagnosis not present

## 2015-01-08 DIAGNOSIS — I2510 Atherosclerotic heart disease of native coronary artery without angina pectoris: Secondary | ICD-10-CM | POA: Insufficient documentation

## 2015-01-08 DIAGNOSIS — I25810 Atherosclerosis of coronary artery bypass graft(s) without angina pectoris: Secondary | ICD-10-CM | POA: Diagnosis not present

## 2015-01-08 DIAGNOSIS — Z951 Presence of aortocoronary bypass graft: Secondary | ICD-10-CM | POA: Diagnosis present

## 2015-01-08 DIAGNOSIS — E782 Mixed hyperlipidemia: Secondary | ICD-10-CM | POA: Diagnosis present

## 2015-01-08 DIAGNOSIS — I10 Essential (primary) hypertension: Secondary | ICD-10-CM | POA: Diagnosis not present

## 2015-01-08 DIAGNOSIS — E1159 Type 2 diabetes mellitus with other circulatory complications: Secondary | ICD-10-CM | POA: Diagnosis present

## 2015-01-08 DIAGNOSIS — E1121 Type 2 diabetes mellitus with diabetic nephropathy: Secondary | ICD-10-CM

## 2015-01-08 DIAGNOSIS — E1169 Type 2 diabetes mellitus with other specified complication: Secondary | ICD-10-CM | POA: Diagnosis present

## 2015-01-08 DIAGNOSIS — E785 Hyperlipidemia, unspecified: Secondary | ICD-10-CM | POA: Diagnosis not present

## 2015-01-08 DIAGNOSIS — Z9861 Coronary angioplasty status: Secondary | ICD-10-CM

## 2015-01-08 DIAGNOSIS — Z955 Presence of coronary angioplasty implant and graft: Secondary | ICD-10-CM

## 2015-01-08 DIAGNOSIS — I25708 Atherosclerosis of coronary artery bypass graft(s), unspecified, with other forms of angina pectoris: Secondary | ICD-10-CM

## 2015-01-08 HISTORY — PX: CARDIAC CATHETERIZATION: SHX172

## 2015-01-08 HISTORY — DX: Gastro-esophageal reflux disease without esophagitis: K21.9

## 2015-01-08 HISTORY — PX: CORONARY ANGIOPLASTY: SHX604

## 2015-01-08 LAB — GLUCOSE, CAPILLARY
Glucose-Capillary: 177 mg/dL — ABNORMAL HIGH (ref 65–99)
Glucose-Capillary: 184 mg/dL — ABNORMAL HIGH (ref 65–99)
Glucose-Capillary: 229 mg/dL — ABNORMAL HIGH (ref 65–99)
Glucose-Capillary: 340 mg/dL — ABNORMAL HIGH (ref 65–99)

## 2015-01-08 LAB — POCT ACTIVATED CLOTTING TIME
Activated Clotting Time: 239 seconds
Activated Clotting Time: 245 seconds
Activated Clotting Time: 288 seconds
Activated Clotting Time: 276 seconds
Activated Clotting Time: 300 seconds
Activated Clotting Time: 540 seconds

## 2015-01-08 LAB — PROTIME-INR
INR: 1.05 (ref 0.00–1.49)
Prothrombin Time: 13.9 seconds (ref 11.6–15.2)

## 2015-01-08 SURGERY — LEFT HEART CATH AND CORS/GRAFTS ANGIOGRAPHY
Anesthesia: LOCAL

## 2015-01-08 MED ORDER — HEPARIN SODIUM (PORCINE) 1000 UNIT/ML IJ SOLN
INTRAMUSCULAR | Status: DC | PRN
  Administered 2015-01-08: 3000 [IU] via INTRAVENOUS
  Administered 2015-01-08: 4000 [IU] via INTRAVENOUS
  Administered 2015-01-08: 2000 [IU] via INTRAVENOUS
  Administered 2015-01-08: 6000 [IU] via INTRAVENOUS
  Administered 2015-01-08: 3000 [IU] via INTRAVENOUS

## 2015-01-08 MED ORDER — FENTANYL CITRATE (PF) 100 MCG/2ML IJ SOLN
INTRAMUSCULAR | Status: AC
  Filled 2015-01-08: qty 4

## 2015-01-08 MED ORDER — METOPROLOL SUCCINATE ER 50 MG PO TB24: 50.0000 mg | ORAL_TABLET | Freq: Every day | ORAL | Status: DC

## 2015-01-08 MED ORDER — MIDAZOLAM HCL 2 MG/2ML IJ SOLN
INTRAMUSCULAR | Status: AC
  Filled 2015-01-08: qty 4

## 2015-01-08 MED ORDER — SODIUM CHLORIDE 0.9 % WEIGHT BASED INFUSION
3.0000 mL/kg/h | INTRAVENOUS | Status: DC
  Administered 2015-01-08: 3 mL/kg/h via INTRAVENOUS

## 2015-01-08 MED ORDER — HEPARIN SODIUM (PORCINE) 1000 UNIT/ML IJ SOLN
INTRAMUSCULAR | Status: AC
  Filled 2015-01-08: qty 1

## 2015-01-08 MED ORDER — SODIUM CHLORIDE 0.9 % IJ SOLN
3.0000 mL | Freq: Two times a day (BID) | INTRAMUSCULAR | Status: DC
  Administered 2015-01-08 (×2): 3 mL via INTRAVENOUS

## 2015-01-08 MED ORDER — CLOPIDOGREL BISULFATE 300 MG PO TABS
ORAL_TABLET | ORAL | Status: DC | PRN
  Administered 2015-01-08: 600 mg via ORAL

## 2015-01-08 MED ORDER — DUTASTERIDE 0.5 MG PO CAPS
0.5000 mg | ORAL_CAPSULE | ORAL | Status: DC
Start: 2015-01-10 — End: 2015-01-09

## 2015-01-08 MED ORDER — VERAPAMIL HCL 2.5 MG/ML IV SOLN
INTRAVENOUS | Status: AC
  Filled 2015-01-08: qty 2

## 2015-01-08 MED ORDER — SODIUM CHLORIDE 0.9 % IJ SOLN: 3.0000 mL | INTRAMUSCULAR | Status: DC | PRN

## 2015-01-08 MED ORDER — LIDOCAINE HCL (PF) 1 % IJ SOLN
INTRAMUSCULAR | Status: AC
  Filled 2015-01-08: qty 30

## 2015-01-08 MED ORDER — SODIUM CHLORIDE 0.9 % WEIGHT BASED INFUSION: 3.0000 mL/kg/h | INTRAVENOUS | Status: AC

## 2015-01-08 MED ORDER — INSULIN GLARGINE 100 UNIT/ML ~~LOC~~ SOLN
32.0000 [IU] | Freq: Every day | SUBCUTANEOUS | Status: DC
  Administered 2015-01-08: 22:00:00 32 [IU] via SUBCUTANEOUS
  Filled 2015-01-08 (×2): qty 0.32

## 2015-01-08 MED ORDER — ASPIRIN 81 MG PO CHEW
CHEWABLE_TABLET | ORAL | Status: AC
  Filled 2015-01-08: qty 1

## 2015-01-08 MED ORDER — CLOPIDOGREL BISULFATE 75 MG PO TABS
75.0000 mg | ORAL_TABLET | Freq: Every day | ORAL | Status: DC
  Administered 2015-01-09: 75 mg via ORAL
  Filled 2015-01-08: qty 1

## 2015-01-08 MED ORDER — SIMVASTATIN 20 MG PO TABS
20.0000 mg | ORAL_TABLET | Freq: Every day | ORAL | Status: DC
  Administered 2015-01-08 – 2015-01-09 (×2): 20 mg via ORAL
  Filled 2015-01-08 (×2): qty 1

## 2015-01-08 MED ORDER — INSULIN ASPART 100 UNIT/ML ~~LOC~~ SOLN
0.0000 [IU] | Freq: Three times a day (TID) | SUBCUTANEOUS | Status: DC
  Administered 2015-01-08: 22:00:00 7 [IU] via SUBCUTANEOUS

## 2015-01-08 MED ORDER — IOHEXOL 350 MG/ML SOLN
INTRAVENOUS | Status: DC | PRN
  Administered 2015-01-08: 320 mL via INTRACARDIAC

## 2015-01-08 MED ORDER — VERAPAMIL HCL 2.5 MG/ML IV SOLN
INTRAVENOUS | Status: AC
Start: 2015-01-08 — End: 2015-01-08
  Filled 2015-01-08: qty 2

## 2015-01-08 MED ORDER — ASPIRIN 81 MG PO CHEW
81.0000 mg | CHEWABLE_TABLET | ORAL | Status: AC
  Administered 2015-01-08: 81 mg via ORAL

## 2015-01-08 MED ORDER — CLOPIDOGREL BISULFATE 300 MG PO TABS
ORAL_TABLET | ORAL | Status: AC
  Filled 2015-01-08: qty 2

## 2015-01-08 MED ORDER — VERAPAMIL HCL 2.5 MG/ML IV SOLN
INTRAVENOUS | Status: DC | PRN
  Administered 2015-01-08 (×2): via INTRA_ARTERIAL

## 2015-01-08 MED ORDER — SODIUM CHLORIDE 0.9 % IV SOLN: 250.0000 mL | INTRAVENOUS | Status: DC | PRN

## 2015-01-08 MED ORDER — FENTANYL CITRATE (PF) 100 MCG/2ML IJ SOLN
INTRAMUSCULAR | Status: DC | PRN
  Administered 2015-01-08 (×4): 25 ug via INTRAVENOUS

## 2015-01-08 MED ORDER — ACETAMINOPHEN 325 MG PO TABS: 650.0000 mg | ORAL_TABLET | ORAL | Status: DC | PRN

## 2015-01-08 MED ORDER — TAMSULOSIN HCL 0.4 MG PO CAPS
0.4000 mg | ORAL_CAPSULE | ORAL | Status: DC
  Administered 2015-01-09: 0.4 mg via ORAL
  Filled 2015-01-08: qty 1

## 2015-01-08 MED ORDER — ONDANSETRON HCL 4 MG/2ML IJ SOLN: 4.0000 mg | Freq: Four times a day (QID) | INTRAMUSCULAR | Status: DC | PRN

## 2015-01-08 MED ORDER — HEPARIN (PORCINE) IN NACL 2-0.9 UNIT/ML-% IJ SOLN
INTRAMUSCULAR | Status: AC
  Filled 2015-01-08: qty 1500

## 2015-01-08 MED ORDER — ANGIOPLASTY BOOK
Freq: Once | Status: AC
  Administered 2015-01-08: 22:00:00
  Filled 2015-01-08: qty 1

## 2015-01-08 MED ORDER — SODIUM CHLORIDE 0.9 % WEIGHT BASED INFUSION
1.0000 mL/kg/h | INTRAVENOUS | Status: DC
  Administered 2015-01-08: 1 mL/kg/h via INTRAVENOUS

## 2015-01-08 MED ORDER — SODIUM CHLORIDE 0.9 % IJ SOLN: 3.0000 mL | Freq: Two times a day (BID) | INTRAMUSCULAR | Status: DC

## 2015-01-08 MED ORDER — ASPIRIN 81 MG PO CHEW
81.0000 mg | CHEWABLE_TABLET | Freq: Every day | ORAL | Status: DC
  Administered 2015-01-09: 81 mg via ORAL
  Filled 2015-01-08: qty 1

## 2015-01-08 MED ORDER — PANTOPRAZOLE SODIUM 40 MG PO TBEC
40.0000 mg | DELAYED_RELEASE_TABLET | Freq: Every day | ORAL | Status: DC
  Administered 2015-01-08: 15:00:00 40 mg via ORAL
  Filled 2015-01-08: qty 1

## 2015-01-08 MED ORDER — MIDAZOLAM HCL 2 MG/2ML IJ SOLN
INTRAMUSCULAR | Status: DC | PRN
  Administered 2015-01-08 (×4): 1 mg via INTRAVENOUS

## 2015-01-08 MED ORDER — LIDOCAINE HCL (PF) 1 % IJ SOLN
INTRAMUSCULAR | Status: DC | PRN
  Administered 2015-01-08: 12:00:00

## 2015-01-08 MED ORDER — INSULIN ASPART 100 UNIT/ML ~~LOC~~ SOLN
0.0000 [IU] | Freq: Three times a day (TID) | SUBCUTANEOUS | Status: DC
  Administered 2015-01-08: 18:00:00 3 [IU] via SUBCUTANEOUS

## 2015-01-08 MED ORDER — HEPARIN (PORCINE) IN NACL 2-0.9 UNIT/ML-% IJ SOLN
INTRAMUSCULAR | Status: AC
  Filled 2015-01-08: qty 500

## 2015-01-08 MED ORDER — DILTIAZEM HCL ER BEADS 240 MG PO CP24: 360.0000 mg | ORAL_CAPSULE | ORAL | Status: DC

## 2015-01-08 SURGICAL SUPPLY — 34 items
BALLN EMERGE MR 2.5X12 (BALLOONS)
BALLN EMERGE MR 2.5X15 (BALLOONS) ×3
BALLN EUPHORA RX 2.0X12 (BALLOONS) ×3
BALLN EUPHORA RX 2.5X15 (BALLOONS) ×6
BALLN EUPHORA RX 3.0X15 (BALLOONS) ×3
BALLOON EMERGE MR 2.5X12 (BALLOONS) IMPLANT
BALLOON EMERGE MR 2.5X15 (BALLOONS) ×2 IMPLANT
BALLOON EUPHORA RX 2.0X12 (BALLOONS) ×2 IMPLANT
BALLOON EUPHORA RX 2.5X15 (BALLOONS) ×4 IMPLANT
BALLOON EUPHORA RX 3.0X15 (BALLOONS) ×2 IMPLANT
CATH INFINITI 5 FR IM (CATHETERS) ×3 IMPLANT
CATH INFINITI 5FR MULTPACK ANG (CATHETERS) ×3 IMPLANT
CATH VISTA GUIDE 6FR XBRCA (CATHETERS) ×3 IMPLANT
DEVICE RAD COMP TR BAND LRG (VASCULAR PRODUCTS) ×3 IMPLANT
DEVICE WIRE ANGIOSEAL 6FR (Vascular Products) ×3 IMPLANT
GLIDESHEATH SLEND SS 6F .021 (SHEATH) ×3 IMPLANT
GUIDE CATH RUNWAY 6FR AL 1 (CATHETERS) ×6 IMPLANT
GUIDE CATH RUNWAY 6FR AL 75 (CATHETERS) ×3 IMPLANT
GUIDE CATH RUNWAY 6FR RCB (CATHETERS) ×3 IMPLANT
GUIDELINER 6F (CATHETERS) ×3 IMPLANT
KIT ENCORE 26 ADVANTAGE (KITS) ×3 IMPLANT
KIT HEART LEFT (KITS) ×3 IMPLANT
PACK CARDIAC CATHETERIZATION (CUSTOM PROCEDURE TRAY) ×3 IMPLANT
PINNACLE LONG 6F 25CM (SHEATH) ×3
SHEATH INTRO PINNACLE 6F 25CM (SHEATH) ×2 IMPLANT
SHEATH PINNACLE 6F 10CM (SHEATH) ×3 IMPLANT
STENT SYNERGY DES 3X28 (Permanent Stent) ×3 IMPLANT
TRANSDUCER W/STOPCOCK (MISCELLANEOUS) ×3 IMPLANT
TUBING CIL FLEX 10 FLL-RA (TUBING) ×3 IMPLANT
WIRE EMERALD 3MM-J .035X150CM (WIRE) ×3 IMPLANT
WIRE HI TORQ VERSACORE-J 145CM (WIRE) ×3 IMPLANT
WIRE HI TORQ WHISPER MS 190CM (WIRE) ×3 IMPLANT
WIRE PT2 MS 185 (WIRE) ×6 IMPLANT
WIRE SAFE-T 1.5MM-J .035X260CM (WIRE) ×3 IMPLANT

## 2015-01-08 NOTE — Progress Notes (Signed)
  Echocardiogram 2D Echocardiogram has been performed.  Jennette Dubin 01/08/2015, 4:34 PM

## 2015-01-08 NOTE — Progress Notes (Signed)
TR BAND REMOVAL  LOCATION:    left radial  DEFLATED PER PROTOCOL:    Yes.    TIME BAND OFF / DRESSING APPLIED:    1445   SITE UPON ARRIVAL:    Level 0  SITE AFTER BAND REMOVAL:    Level 0  CIRCULATION SENSATION AND MOVEMENT:    Within Normal Limits   Yes.    COMMENTS:   Tolerated procedure well

## 2015-01-08 NOTE — Interval H&P Note (Signed)
History and Physical Interval Note:  01/08/2015 8:41 AM  [name]  has presented today for surgery, with the diagnosis of shortness of breath/abnormal mioview  The various methods of treatment have been discussed with the patient and family. After consideration of risks, benefits and other options for treatment, the patient has consented to  Procedure(s): Left Heart Cath and Cors/Grafts Angiography (N/A) as a surgical intervention .  The patient's history has been reviewed, patient examined, no change in status, stable for surgery.  I have reviewed the patient's chart and labs.  Questions were answered to the patient's satisfaction.   Cath Lab Visit (complete for each Cath Lab visit)  Clinical Evaluation Leading to the Procedure:   ACS: No.  Non-ACS:    Anginal Classification: CCS II  Anti-ischemic medical therapy: Maximal Therapy (2 or more classes of medications)  Non-Invasive Test Results: Intermediate-risk stress test findings: cardiac mortality 1-3%/year  Prior CABG: Previous CABG        Collier Salina [facility] 01/08/2015 8:41 AM

## 2015-01-08 NOTE — H&P (View-Only) (Signed)
[name] Date of Birth: [birth year]   History of Present Illness: [name] is seen for followup atrial fibrillation and CAD.  He  Has a history of chronic atrial fibrillation managed with rate control and anticoagulation with coumadin. He also has a history of CAD s/p CABG in 1990. His last stress Myoview was 2012. Echo in 2013 showed normal LV function. Very mild AS.  biatrial enlargement. On follow up today he does report increased symptoms of dyspnea on exertion. This was particularly bad in hot weather. Denies any chest pain.He denies any significant palpitations.. He complains about increased vertigo symptoms. He had vestibular PT in the past and this would help for 6 months but now symptoms are back. Because of his SOB he has significantly reduced his activity.  Current Outpatient Prescriptions on File Prior to Visit  Medication Sig Dispense Refill  . AVODART 0.5 MG capsule Take 0.5 mg by mouth every other day.     . diltiazem (TIAZAC) 360 MG 24 hr capsule Take 1 capsule by mouth every other day.     . diltiazem (TIAZAC) 360 MG 24 hr capsule Take 360 mg by mouth.    [name] glucose blood (PRECISION XTRA TEST STRIPS) test strip TEST BLOOD SUGAR 3 TO 4 TIMES DAILY OR AS DIRECTED (DX: 250.00)    . hydrochlorothiazide (MICROZIDE) 12.5 MG capsule Take 12.5 mg by mouth.    . insulin glargine (LANTUS) 100 UNIT/ML injection Inject 32 Units into the skin daily.     . Insulin Pen Needle (B-D ULTRAFINE III SHORT PEN) 31G X 8 MM MISC USE AS DIRECTED BY PHYSICIAN    . lisinopril (PRINIVIL,ZESTRIL) 40 MG tablet Take 40 mg by mouth daily.    . metFORMIN (GLUCOPHAGE) 500 MG tablet Take 1,000 mg by mouth 2 (two) times daily with a meal. 1 TABLETS IN THE MORNING, 1 tablets in the evening    . metoprolol succinate (TOPROL-XL) 100 MG 24 hr tablet Take 1 tablet by mouth daily.    . simvastatin (ZOCOR) 40 MG tablet Take 20 mg by mouth daily.     . Tamsulosin HCl (FLOMAX) 0.4 MG CAPS Take 0.4 mg by mouth  every other day.     . warfarin (COUMADIN) 5 MG tablet 1 tablet daily except 1.5 tablets on Wednesdays or as directed by coumadin clinic 120 tablet 1   No current facility-administered medications on file prior to visit.    Allergies  Allergen Reactions  . Amoxicillin   . Cefadroxil   . Ciprofloxacin   . Erythromycin     Past Medical History  Diagnosis Date  . CAD (coronary artery disease)     CABG IN 1990  . Mild aortic stenosis   . HTN (hypertension)   . Diabetes mellitus type 2, insulin dependent ([place])   . Hyperlipemia   . Bladder outlet obstruction   . BPH (benign prostatic hyperplasia)   . Elevated cholesterol   . Kidney stones   . Abdominal pain   . Constipation   . Bladder outlet obstruction   . Acute renal failure ([place])      resolved  . Urinary tract infection      Enterococcus  . Rib fractures     left rib fractures being treated with pain medications  . Arthritis   . Atrial fibrillation [facility])     Past Surgical History  Procedure Laterality Date  . Coronary artery bypass graft  1990  . Total hip arthroplasty  2000  RIGHT HIP  . Cardiac catheterization  2003  . Carpel tunnel  08/2010    right  . Cataract extraction, bilateral    . Lithotripsy      History  Smoking status  . Former Smoker  . Types: Cigarettes  . Quit date: 05/11/1958  Smokeless tobacco  . Not on file    History  Alcohol Use No    Family History  Problem Relation Age of Onset  . Brain cancer Father   . Throat cancer Brother     Review of Systems: As noted in history of present illness.  All other systems were reviewed and are negative.  Physical Exam: BP 142/70 mmHg  Pulse 90  Ht 5\' 6"  (1.676 m)  Wt 102.15 kg (225 lb 3.2 oz)  BMI 36.37 kg/m2 He is a pleasant elderly white male who is obese. HEENT exam is unremarkable. Sclera are clear. PERRLA. Oropharynx is clear. Neck is supple no JVD, adenopathy, thyromegaly, or bruits. Lungs are clear. Cardiac exam reveals a  grade 1/6 systolic ejection murmur right upper sternal border. His pulse is irregular. Old median sternotomy scar. Abdomen is obese, soft, nontender. He has no edema. Pedal pulses are palpable. He is alert and oriented x3. Cranial nerves II through XII are intact. Skin is warm and dry.  LABORATORY DATA: Labs reviewed from Dr. Jonni Sanger- last A1c 8.4%. Last lipid panel in Jan 2016 cholesterol 160, triglycerides 113, HDL 57, LDL 87.  Ecg today shows atrial fibrillation with rate 90. Nonspecific TWA. I have personally reviewed and interpreted this study.    Assessment / Plan: 1. Chronic atrial fibrillation. Rate well controlled. On coumadin. INR 21.4. Dose adjusted by pharmacy.  Asymptomatic. Continue current therapy  2. CAD s/p remote CABG 26 years ago. He is experiencing increased dyspnea on exertion that may be anginal equivalent. Will schedule for [employer].   3. Chronic anticoagulation  4. HTN controlled.  5. DM type 2- on insulin  6. Hyperlipidemia.  7. Benign positional vertigo. Will refer again for vestibular training with PT.    I will follow up in 6 months.

## 2015-01-09 ENCOUNTER — Encounter (HOSPITAL_COMMUNITY): Payer: Self-pay | Admitting: Physician Assistant

## 2015-01-09 DIAGNOSIS — I2510 Atherosclerotic heart disease of native coronary artery without angina pectoris: Secondary | ICD-10-CM | POA: Diagnosis not present

## 2015-01-09 DIAGNOSIS — R931 Abnormal findings on diagnostic imaging of heart and coronary circulation: Secondary | ICD-10-CM | POA: Diagnosis not present

## 2015-01-09 DIAGNOSIS — Z9861 Coronary angioplasty status: Secondary | ICD-10-CM

## 2015-01-09 DIAGNOSIS — Z794 Long term (current) use of insulin: Secondary | ICD-10-CM

## 2015-01-09 DIAGNOSIS — I10 Essential (primary) hypertension: Secondary | ICD-10-CM | POA: Diagnosis not present

## 2015-01-09 DIAGNOSIS — E119 Type 2 diabetes mellitus without complications: Secondary | ICD-10-CM

## 2015-01-09 DIAGNOSIS — Z955 Presence of coronary angioplasty implant and graft: Secondary | ICD-10-CM

## 2015-01-09 DIAGNOSIS — E785 Hyperlipidemia, unspecified: Secondary | ICD-10-CM

## 2015-01-09 DIAGNOSIS — I482 Chronic atrial fibrillation: Secondary | ICD-10-CM | POA: Diagnosis not present

## 2015-01-09 DIAGNOSIS — I25810 Atherosclerosis of coronary artery bypass graft(s) without angina pectoris: Secondary | ICD-10-CM | POA: Diagnosis not present

## 2015-01-09 DIAGNOSIS — I2582 Chronic total occlusion of coronary artery: Secondary | ICD-10-CM | POA: Diagnosis not present

## 2015-01-09 DIAGNOSIS — I25708 Atherosclerosis of coronary artery bypass graft(s), unspecified, with other forms of angina pectoris: Secondary | ICD-10-CM

## 2015-01-09 LAB — CBC
HCT: 34.3 % — ABNORMAL LOW (ref 39.0–52.0)
Hemoglobin: 11.7 g/dL — ABNORMAL LOW (ref 13.0–17.0)
MCH: 29 pg (ref 26.0–34.0)
MCHC: 34.1 g/dL (ref 30.0–36.0)
MCV: 84.9 fL (ref 78.0–100.0)
Platelets: 165 10*3/uL (ref 150–400)
RBC: 4.04 MIL/uL — ABNORMAL LOW (ref 4.22–5.81)
RDW: 13.3 % (ref 11.5–15.5)
WBC: 8.4 10*3/uL (ref 4.0–10.5)

## 2015-01-09 LAB — GLUCOSE, CAPILLARY: Glucose-Capillary: 103 mg/dL — ABNORMAL HIGH (ref 65–99)

## 2015-01-09 LAB — BASIC METABOLIC PANEL
Anion gap: 6 (ref 5–15)
BUN: 11 mg/dL (ref 6–20)
CO2: 28 mmol/L (ref 22–32)
Calcium: 9.2 mg/dL (ref 8.9–10.3)
Chloride: 106 mmol/L (ref 101–111)
Creatinine, Ser: 0.99 mg/dL (ref 0.61–1.24)
GFR calc Af Amer: >60 mL/min (ref >60)
GFR calc non Af Amer: >60 mL/min (ref >60)
Glucose, Bld: 114 mg/dL — ABNORMAL HIGH (ref 65–99)
Potassium: 4.4 mmol/L (ref 3.5–5.1)
Sodium: 140 mmol/L (ref 135–145)

## 2015-01-09 MED ORDER — METOPROLOL SUCCINATE ER 50 MG PO TB24
100.0000 mg | ORAL_TABLET | Freq: Every day | ORAL | Status: DC
  Administered 2015-01-09: 08:00:00 100 mg via ORAL
  Filled 2015-01-09: qty 2

## 2015-01-09 MED ORDER — CLOPIDOGREL BISULFATE 75 MG PO TABS: 75.0000 mg | ORAL_TABLET | Freq: Every day | ORAL | Status: DC

## 2015-01-09 MED ORDER — NITROGLYCERIN 0.4 MG SL SUBL: 0.4000 mg | SUBLINGUAL_TABLET | SUBLINGUAL | Status: DC | PRN

## 2015-01-09 MED ORDER — ASPIRIN 81 MG PO CHEW: 81.0000 mg | CHEWABLE_TABLET | Freq: Every day | ORAL | Status: DC

## 2015-01-09 MED ORDER — PANTOPRAZOLE SODIUM 40 MG PO TBEC: 40.0000 mg | DELAYED_RELEASE_TABLET | Freq: Every day | ORAL | Status: DC

## 2015-01-09 MED FILL — Verapamil HCl IV Soln 2.5 MG/ML: INTRAVENOUS | Qty: 2 | Status: AC

## 2015-01-09 NOTE — Progress Notes (Signed)
Nitroglycerin sent to patient's pharmacy  Signed, Almyra Deforest PA Pager: [phone]

## 2015-01-09 NOTE — Progress Notes (Signed)
CARDIAC REHAB PHASE I   PRE:  Rate/Rhythm: 122 afib  BP:  Supine:   Sitting: 129/48  Standing:    SaO2: 98%RA  MODE:  Ambulation: 300 ft   POST:  Rate/Rhythm: 142 afib  BP:  Supine:   Sitting: 157/76  Standing:    SaO2: 98%RA 0745-0830 Pt walked 300 ft with steady gait. Could have walked farther but just got metropolol and heart rate to 140's. Denied palpitations or dizziness. Reviewed importance of plavix with stent, NTG use, ex ed and pt follows diabetic diet. Discussed CRP 2 and pt very interested in attending [facility]. Will refer.   Graylon Good, RN BSN  01/09/2015 8:35 AM

## 2015-01-09 NOTE — Discharge Summary (Signed)
Discharge Summary   Patient ID: [name],  MRN: [id number], DOB/AGE: [birth year] 81 y.o.  Admit date: 01/08/2015 Discharge date: 01/09/2015  Primary Care Provider: ANDY,CAMILLE L Primary Cardiologist: Dr. Martinique  Discharge Diagnoses Principal Problem:   Abnormal nuclear stress test Active Problems:   CAD (coronary artery disease)   HTN (hypertension)   Diabetes mellitus type 2, insulin dependent (HCC)   Hyperlipemia   Atrial fibrillation (HCC)   Allergies Allergies  Allergen Reactions  . Amoxicillin Other (See Comments)    Unknown  . Cefadroxil Other (See Comments)    Unknown  . Ciprofloxacin Other (See Comments)    Unknown  . Erythromycin Other (See Comments)    Upset stomach    Procedures  Cardiac catheterization 01/08/2015 Conclusion     Prox RCA lesion, 40% stenosed.  LM lesion, 20% stenosed.  Ost LAD to Prox LAD lesion, 100% stenosed.  SVG .  Origin lesion, 100% stenosed.  Mid RCA-2 lesion, 70% stenosed.  Mid RCA-1 lesion, 99% stenosed. Post intervention, there is a 0% residual stenosis.  1. Severe 2 vessel obstructive CAD. CTO of the origin of the LAD. Critical mid RCA stenosis. 2. Occluded SVG to the diagonal 3. LIMA to the LAD was not visualized but assumed patent based on clinical history and Myoview findings. 4. Normal LV EDP. 5. Successful stenting of the Mid RCA with DES. Very difficult procedure due to vessel tortuosity.  Plan: DAPT with ASA and Plavix for one month then stop ASA and continue Plavix for at least one year. Resume Coumadin tomorrow. Will assess LV function with an Echo. Stop prilosec and start protonix. Anticipate DC in am if stable. Patient noted to be bradycardic throughout case so will reduce metoprolol to 50 mg daily.      Echocardiogram 01/08/2015 LV EF: 55% -  60%  ------------------------------------------------------------------- Indications:   CAD of native vessels  414.01.  ------------------------------------------------------------------- History:  PMH:  Atrial fibrillation. Coronary artery disease. Aortic valve disease. Risk factors: Hypertension. Diabetes mellitus. Dyslipidemia.  ------------------------------------------------------------------- Study Conclusions  - Left ventricle: The cavity size was normal. Wall thickness was increased in a pattern of moderate LVH. Systolic function was normal. The estimated ejection fraction was in the range of 55% to 60%. Wall motion was normal; there were no regional wall motion abnormalities. - Aortic valve: There was trivial regurgitation. Valve area (VTI): 1.94 cm^2. Valve area (Vmax): 2.15 cm^2. Valve area (Vmean): 2.1 cm^2. - Right atrium: The atrium was mildly dilated.     Hospital Course  The patient is a 81 year old male with past medical history of HTN, HLD, DM2, benign positional vertigo, chronic atrial fibrillation on Coumadin and CAD s/p CABG 1990. He was in the office on 12/19/2014, at which time he complained of increasing symptom of dyspnea on exertion. Outpatient Myoview was performed on 12/19/2014 which came back intermediate risk study with inferior wall ischemia. This is new when compared to previous study in 2012. Given his symptom of dyspnea on exertion, cardiac catheterization was recommended. He was instructed to hold his Coumadin for 5 days prior to the procedure.  He presented for outpatient cardiac catheterization on 01/08/2015 which showed CTO of ost LAD, LIMA to LAD not visualized but assumed patent given myoview finding, occluded SVG to diagonal, 99% mid RCA lesion treated with SYNERGY DES 3X28 mm. postprocedure, he had some bradycardia, his Toprol-XL was cut down to 50 mg daily. Echocardiogram was performed on the same day which showed EF 55-60%, no RWMA, moderate LVH.   He was  seen on the following morning on 01/09/2015, which time he denies any significant  chest discomfort or shortness breath. His heart rate has been going up to high 90s to 110s range. The previous bradycardia post-cath was felt to be related to sedation versus sheath pull. His Toprol-XL has been increased to 100 mg daily. He is deemed stable for discharge if his heart rate remained stable. It is unclear why he is taking diltiazem 360 mg every other day instead of daily, this will need to be readdressed on follow-up by patient's primary cardiologist. He has a 2-4 weeks follow-up with Dr. Doug Sou PA/NP in the clinic. I have also arranged for him to have coumadin checked in 5 days. Emphasis has been placed on compliance with DAPT. His coumadin was restarted. Given triple therapy, he has been instructed to monitor for sign of bleeding. After 1 month, we will stop his ASA and continue plavix and coumadin only. He has been instructed to hold metformin for 48 hours after cath. He has ambulated with cardiac rehab without discomfort, outpatient cardiac rehab phase 2 referral made.  Note, after starting toprol XL 100mg  daily, his HR improved. I have restarted his HCTZ. Will continue to hold lisinopril 40mg  until his BP improve on followup. I have changed his prilosec to protonix to decrease interaction with plavix.    Discharge Vitals Blood pressure 129/48, pulse 104, temperature 98.6 F (37 C), temperature source Oral, resp. rate 18, height 6\' 2"  (1.88 m), weight 232 lb 5.8 oz (105.4 kg), SpO2 98 %.  Filed Weights   01/08/15 0631 01/08/15 0643 01/09/15 0415  Weight: 220 lb (99.791 kg) 265 lb (120.203 kg) 232 lb 5.8 oz (105.4 kg)    Labs  CBC  Recent Labs  01/06/15 1026 01/09/15 0506  WBC 9.4 8.4  NEUTROABS 6.8  --   HGB 13.5 11.7*  HCT 40.8 34.3*  MCV 87.4 84.9  PLT 240 123XX123   Basic Metabolic Panel  Recent Labs  01/06/15 1026 01/09/15 0506  NA 139 140  K 4.7 4.4  CL 103 106  CO2 26 28  GLUCOSE 259* 114*  BUN 22 11  CREATININE 1.27* 0.99  CALCIUM 9.1 9.2     Disposition  Pt is being discharged home today in good condition.  Follow-up Plans & Appointments      Follow-up Information    Follow up with [facility] On 01/15/2015.   Specialty:  Cardiology   Why:  @3PM . Coumadin clinic followup.   Contact information:   [address] [place] [phone]      Follow up with [facility] On 01/28/2015.   Specialty:  Cardiology   Why:  Cardiology followup, with Lauretta Chester. 10:00am   Contact information:   [address] [place] [phone]      Discharge Medications    Medication List    STOP taking these medications        lisinopril 40 MG tablet  Commonly known as:  PRINIVIL,ZESTRIL     PRILOSEC OTC 20 MG tablet  Generic drug:  omeprazole      TAKE these medications        aspirin 81 MG chewable tablet  Chew 1 tablet (81 mg total) by mouth daily.     AVODART 0.5 MG capsule  Generic drug:  dutasteride  Take 0.5 mg by mouth every other day.     clopidogrel 75 MG tablet  Commonly known as:  PLAVIX  Take 1 tablet (75 mg total) by mouth daily with breakfast.     diltiazem 360 MG 24 hr capsule  Commonly known as:  TIAZAC  Take 360 mg by mouth every other day.     FLOMAX 0.4 MG Caps capsule  Generic drug:  tamsulosin  Take 0.4 mg by mouth every other day.     hydrochlorothiazide 12.5 MG capsule  Commonly known as:  MICROZIDE  Take 12.5 mg by mouth daily.     insulin glargine 100 UNIT/ML injection  Commonly known as:  LANTUS  Inject 32 Units into the skin daily.     metFORMIN 500 MG tablet  Commonly known as:  GLUCOPHAGE  Take 1,000 mg by mouth 2 (two) times daily with a meal.     metoprolol succinate 100 MG 24 hr tablet  Commonly known as:  TOPROL-XL  Take 100 mg by mouth daily.     pantoprazole 40 MG tablet  Commonly known as:  PROTONIX  Take 1 tablet (40 mg total) by mouth daily at 12 noon.      PRECISION XTRA TEST STRIPS test strip  Generic drug:  glucose blood  1 each by Other route See admin instructions. Check blood sugar once daily     simvastatin 40 MG tablet  Commonly known as:  ZOCOR  Take 20 mg by mouth daily.     warfarin 5 MG tablet  Commonly known as:  COUMADIN  1 tablet daily except 1.5 tablets on Wednesdays or as directed by coumadin clinic        Outstanding Labs/Studies  Followup with coumadin clinic  Duration of Discharge Encounter   Greater than 30 minutes including physician time.  Hilbert Corrigan [name] Pager: [phone] 01/09/2015, 9:27 AM   Pt seen & examined on the day of d/c -- HR was up with his Chronic AFib.  His BB dose was decreased yesterday -- redosed this AM as prior home & rate control reassessed prior to d/c.  Had Successful stenting of the Mid RCA with DES. Very difficult procedure due to vessel tortuosity.  Plan: DAPT with ASA and Plavix for one month then stop ASA and continue Plavix for at least one year. Resume Coumadin tomorrow. Will assess LV function with an Echo. Stop prilosec and start protonix.  Leonie Man, M.D., M.S. Interventional Cardiologist   Pager # [phone]

## 2015-01-09 NOTE — Discharge Instructions (Signed)
Hold metformin for 48 hours after cath, you can restart on Saturday 01/11/2015  The plan is to continue aspirin, plavix and coumadin for now, stop aspirin after 1 month and continue on plavix and coumadin after that.    No driving for 48 hours. No lifting over 5 lbs for 1 week. No sexual activity for 1 week. Keep procedure site clean & dry. If you notice increased pain, swelling, bleeding or pus, call/return!  You may shower, but no soaking baths/hot tubs/pools for 1 week.

## 2015-01-09 NOTE — Progress Notes (Signed)
Patient Name: [name] Date of Encounter: 01/09/2015  Primary Cardiologist: Dr. Martinique   Principal Problem:   Abnormal nuclear stress test Active Problems:   CAD (coronary artery disease)   HTN (hypertension)   Diabetes mellitus type 2, insulin dependent ([place])   Hyperlipemia   Atrial fibrillation ([place])    SUBJECTIVE  Denies any CP or SOB.   CURRENT MEDS . aspirin  81 mg Oral Daily  . clopidogrel  75 mg Oral Q breakfast  . [START ON 01/10/2015] diltiazem  360 mg Oral QODAY  . [START ON 01/10/2015] dutasteride  0.5 mg Oral QODAY  . insulin aspart  0-9 Units Subcutaneous TID WC & HS  . insulin glargine  32 Units Subcutaneous Daily  . metoprolol succinate  50 mg Oral Daily  . pantoprazole  40 mg Oral Q1200  . simvastatin  20 mg Oral Daily  . sodium chloride  3 mL Intravenous Q12H  . tamsulosin  0.4 mg Oral QODAY    OBJECTIVE  Filed Vitals:   01/08/15 1217 01/08/15 1600 01/08/15 1949 01/09/15 0415  BP: 133/60 123/70 152/72 168/92  Pulse: 56 58 76 89  Temp: 97.7 F (36.5 C) 97.9 F (36.6 C) 98.9 F (37.2 C) 98.6 F (37 C)  TempSrc: Oral Oral Oral Oral  Resp: 20 20 29 20   Height:      Weight:    232 lb 5.8 oz (105.4 kg)  SpO2: 97% 96% 96% 96%    Intake/Output Summary (Last 24 hours) at 01/09/15 0659 Last data filed at 01/09/15 0414  Gross per 24 hour  Intake 1051.35 ml  Output   2200 ml  Net -1148.65 ml   Filed Weights   01/08/15 0631 01/08/15 0643 01/09/15 0415  Weight: 220 lb (99.791 kg) 265 lb (120.203 kg) 232 lb 5.8 oz (105.4 kg)    PHYSICAL EXAM  General: Pleasant, NAD. Neuro: Alert and oriented X 3. Moves all extremities spontaneously. Psych: Normal affect. HEENT:  Normal  Neck: Supple without bruits or JVD. Lungs:  Resp regular and unlabored, CTA. Heart: irregular. no s3, s4, or murmurs. L radial and R femoral cath site stable.  Abdomen: Soft, non-tender, non-distended, BS + x 4.  Extremities: No clubbing, cyanosis or edema.  DP/PT/Radials 2+ and equal bilaterally.  Accessory Clinical Findings  CBC  Recent Labs  01/06/15 1026 01/09/15 0506  WBC 9.4 8.4  NEUTROABS 6.8  --   HGB 13.5 11.7*  HCT 40.8 34.3*  MCV 87.4 84.9  PLT 240 123XX123   Basic Metabolic Panel  Recent Labs  01/06/15 1026 01/09/15 0506  NA 139 140  K 4.7 4.4  CL 103 106  CO2 26 28  GLUCOSE 259* 114*  BUN 22 11  CREATININE 1.27* 0.99  CALCIUM 9.1 9.2    TELE afib with HR 80-90s, HR 100-110s this morning. Bradycardia with HR 40s yesterday afternoon    ECG  afib with HR 80s  Echocardiogram 01/08/2015  LV EF: 55% -  60%  ------------------------------------------------------------------- Indications:   CAD of native vessels 414.01.  ------------------------------------------------------------------- History:  PMH:  Atrial fibrillation. Coronary artery disease. Aortic valve disease. Risk factors: Hypertension. Diabetes mellitus. Dyslipidemia.  ------------------------------------------------------------------- Study Conclusions  - Left ventricle: The cavity size was normal. Wall thickness was increased in a pattern of moderate LVH. Systolic function was normal. The estimated ejection fraction was in the range of 55% to 60%. Wall motion was normal; there were no regional wall motion abnormalities. - Aortic valve: There was trivial regurgitation. Valve  area (VTI): 1.94 cm^2. Valve area (Vmax): 2.15 cm^2. Valve area (Vmean): 2.1 cm^2. - Right atrium: The atrium was mildly dilated.     Radiology/Studies  Dg Chest 2 View  01/06/2015  CLINICAL DATA:  Cardiac catheterization.  Shortness of breath. EXAM: CHEST  2 VIEW COMPARISON:  None. FINDINGS: Prior median sternotomy and CABG. Cardiomegaly with normal pulmonary vascularity. No focal infiltrate. No pleural effusion or pneumothorax. Degenerative changes thoracic spine . IMPRESSION: No acute cardiopulmonary disease.  Prior CABG. Electronically Signed    By: Marcello Moores  Register   On: 01/06/2015 11:57    ASSESSMENT AND PLAN  81 yo male with chronic afib on coumadin and CAD s/p CABG 1990 presented to the clinic on 10/20 with dyspnea on exertion. Myoview abnormal. Underwent outpatient cath 01/08/2015  1. Increasing DOE with abnormal myoview  - Myoview 12/27/2014 intermediate risk study with inferior wall ischemia which is new when compare to previous study in 2012  - Cath 01/08/2015 CTO of ost LAD, LIMA to LAD not visualized but assumed patent given myoview finding, occluded SVG to diagonal, 99% mid RCA lesion treated with SYNERGY DES 3X28 mm   - Echo 01/08/2015 EF 55-60%, no RWMA, moderate LVH  - per cath report, plan for ASA and plavix for 1 month. Resume coumadin today. Then after 1st month, continue plavix and coumadin for 1 year  - metoprolol cut down to 50s yesterday post cath due to bradycardia (related to sedation or sheath pull), HR now in 90-110s, will increase metoprolol XL back to home dose. Stable for discharge today if HR remain stable.   2. Chronic atrial fibrillation on coumadin  - some bradycardia yesterday post cath, Toprol XL decreased to 50mg  daily. Continue diltiazem.   3. CAD s/p CABG 1990  4. HTN 5. HLD 6. DM2: hold metformin for 48 hours post cath 7. Benign positional vertigo  Signed, Almyra Deforest [name] Pager: [phone]

## 2015-01-10 ENCOUNTER — Telehealth: Payer: Self-pay

## 2015-01-10 MED ORDER — DILTIAZEM HCL ER COATED BEADS 360 MG PO CP24: 360.0000 mg | ORAL_CAPSULE | Freq: Every day | ORAL | Status: DC

## 2015-01-10 NOTE — Telephone Encounter (Signed)
Received call from patient 01/09/15.Stated he noticed on discharge medication list says to take avodart 0.5 mg every other day.Stated he use to alternate avodart 0.5 mg every other day and tamulosin 4 mg every other.Also says to take diltiazem 360 mg every other day and he use to take diltiazem 360 mg daily.Spoke to [place] he advised take avodart 0.5 mg every other day alternating tamulosin 4 mg every other day.Advsied to take diltiazem 360 mg daily.

## 2015-01-15 ENCOUNTER — Ambulatory Visit (INDEPENDENT_AMBULATORY_CARE_PROVIDER_SITE_OTHER): Payer: Medicare Other | Admitting: Pharmacist Clinician (PhC)/ Clinical Pharmacy Specialist

## 2015-01-15 DIAGNOSIS — I2510 Atherosclerotic heart disease of native coronary artery without angina pectoris: Secondary | ICD-10-CM | POA: Diagnosis not present

## 2015-01-15 DIAGNOSIS — Z7901 Long term (current) use of anticoagulants: Secondary | ICD-10-CM | POA: Diagnosis not present

## 2015-01-15 DIAGNOSIS — I10 Essential (primary) hypertension: Secondary | ICD-10-CM

## 2015-01-15 DIAGNOSIS — I4891 Unspecified atrial fibrillation: Secondary | ICD-10-CM | POA: Diagnosis not present

## 2015-01-15 DIAGNOSIS — Z5181 Encounter for therapeutic drug level monitoring: Secondary | ICD-10-CM | POA: Diagnosis not present

## 2015-01-15 LAB — POCT INR: INR: 1.3

## 2015-01-16 ENCOUNTER — Ambulatory Visit: Payer: Medicare Other

## 2015-01-16 VITALS — BP 149/73 | HR 111

## 2015-01-16 DIAGNOSIS — R42 Dizziness and giddiness: Secondary | ICD-10-CM

## 2015-01-16 DIAGNOSIS — R269 Unspecified abnormalities of gait and mobility: Secondary | ICD-10-CM

## 2015-01-16 NOTE — Therapy (Signed)
[facility] [address] [place], [place], [address] Phone: [phone]   Fax:  [phone]  Physical Therapy Treatment  Patient Details  Name: Marguis Mathieson MRN: [id number] Date of Birth: 10/12/1928 Referring Provider: Dr. Billey Chang  Encounter Date: 01/16/2015      PT End of Session - 01/16/15 0910    Visit Number 2   Number of Visits 4   Date for PT Re-Evaluation 02/02/15   Authorization Type G-code every 10th visit   PT Start Time 0845   PT Stop Time 0909   PT Time Calculation (min) 24 min   Activity Tolerance Patient tolerated treatment well   Behavior During Therapy [facility] for tasks assessed/performed      Past Medical History  Diagnosis Date  . Mild aortic stenosis   . HTN (hypertension)   . Hyperlipemia   . Bladder outlet obstruction   . BPH (benign prostatic hyperplasia)   . Kidney stones   . Abdominal pain   . Constipation   . Bladder outlet obstruction   . Acute renal failure ([place])      resolved  . Urinary tract infection      Enterococcus  . Rib fractures     left rib fractures being treated with pain medications  . CAD (coronary artery disease)     a. CABG IN 1989. b. 01/08/2015 CTO of ost LAD, LIMA to LAD not visualized but assumed patent given myoview finding, occluded SVG to diagonal, 99% mid RCA tx w/ SYNERGY DES 3X28 mm  . Diabetes mellitus type 2, insulin dependent ([place])   . Atrial fibrillation ([place])   . GERD (gastroesophageal reflux disease)   . Arthritis     "hands & legs" (11/'10/2014)    Past Surgical History  Procedure Laterality Date  . Coronary artery bypass graft  1989    "CABG X 2"  . Total hip arthroplasty Right 2000  . Cardiac catheterization  1989  . Carpal tunnel release Right 08/2010  . Cataract extraction w/ intraocular lens  implant, bilateral Bilateral   . Joint replacement    . Cardiac catheterization N/A 01/08/2015    Procedure: Left Heart Cath and  Cors/Grafts Angiography;  Surgeon: Peter M Martinique, MD;  Location: [facility];  Service: Cardiovascular;  Laterality: N/A;  . Cardiac catheterization  01/08/2015    Procedure: Coronary Stent Intervention;  Surgeon: Peter M Martinique, MD;  Location: [facility];  Service: Cardiovascular;;  . Tonsillectomy    . Coronary angioplasty      Filed Vitals:   01/16/15 0901  BP: 149/73  Pulse: 111  SpO2: 99%   HR: 87bpm after 3 minutes of rest, after amb.   Visit Diagnosis:  Dizziness and giddiness  Abnormality of gait      Subjective Assessment - 01/16/15 0849    Subjective Pt reported no dizziness since last visit. Pt reported he had a heart stent last Wednesday and has no precautions and is walking up to 13 minutes every day. Pt denied chest pain.   Patient Stated Goals to get rid of the dizziness    Currently in Pain? No/denies           Neuro re-ed:     Vestibular Assessment - 01/16/15 0850    Dix-Hallpike Right   Dix-Hallpike Right Duration none   Dix-Hallpike Right Symptoms No nystagmus   Dix-Hallpike Left   Dix-Hallpike Left Duration <5 seconds, 1-2/10 wooziness/dizziness   Dix-Hallpike Left Symptoms No nystagmus  [facility] Adult PT Treatment/Exercise - 2015/02/04 0001    Ambulation/Gait   Ambulation/Gait Yes   Ambulation/Gait Assistance 7: Independent   Ambulation/Gait Assistance Details Pt amb. over even/uneven terrain, while performing head turns, with no LOB or dizziness. Vitals WNL after amb. And pt denied chest pain or lightheadedness.   Ambulation Distance (Feet) 650 Feet   Assistive device None   Gait Pattern Step-through pattern;Decreased stride length;Decreased trunk rotation   Ambulation Surface Level;Unlevel;Indoor;Outdoor;Paved;Grass         Vestibular Treatment/Exercise - 02-04-15 0001    Vestibular Treatment/Exercise   Vestibular Treatment Provided Canalith Repositioning   Canalith Repositioning Epley Manuever Left     EPLEY MANUEVER LEFT   Number of Reps  1   Overall Response  Symptoms Resolved    RESPONSE DETAILS LEFT Pt reported "tiny" bit of wooziness during first position, which quickly resolved.          Self Care:     PT Education - 2015/02/04 0910    Education provided Yes   Education Details PT discussed d/c based on meeting goals and pt agreeable and reports no difficulty performing ADLs or amb. 2/2 dizziness. PT educated pt to ask MD for PT referral if vertigo returns.    Person(s) Educated Patient   Methods Explanation   Comprehension Verbalized understanding    [place]: 16       PT Short Term Goals - 01/03/15 1046    PT SHORT TERM GOAL #1   Title Same as LTGs           PT Long Term Goals - 2015-02-04 0912    PT LONG TERM GOAL #1   Title Pt will report 0/10 dizziness while while looking up, bending forward, turning head, rolling in bed, in order to perform ADLs safely. Target date: 01/31/15.   Status Achieved   PT LONG TERM GOAL #2   Title Perform FGA and write goal. [place] date: 01/31/15.   Status Deferred   PT LONG TERM GOAL #3   Title Pt will amb. 600' over even/uneven terrain at MOD I level to improve functional mobility. Target date: 01/31/15.   Status Achieved   PT LONG TERM GOAL #4   Title Pt will be IND in HEP to improve dizziness, strength, flexibility, and endurance. Target date: 01/31/15.   Status Deferred   PT LONG TERM GOAL #5   Title Pt will improve DHI score from 40 to 22 in order to improve quality of life. Target date: 01/31/15.   Status Achieved               Plan - 02/04/2015 0911    Clinical Impression Statement Pt met LTGs 1, 3 and 5. Goals 2 and 4 deferred as pt demonstrated great progress. Pt reported slight wooziness during L Dix-Hallpike, but no nystagmus noted. However, PT treated with L Epley maneuver to ensure that vertigo resolves and pt reported no dizziness after Epley. Pt discharging due to progress and meeting goals. Please see d/c  summary for details.          G-Codes - 2015-02-04 0912    Functional Assessment Tool Used DHI: 16, HEP not required as dizziness has resolved and improved from 9-10/10 to 0/10 during activity.   Functional Limitation Self care   Self Care Goal Status (N8295) At least 1 percent but less than 20 percent impaired, limited or restricted   Self Care Discharge Status [phone]) At least 1 percent but less than 20 percent impaired, limited or  restricted      Problem List Patient Active Problem List   Diagnosis Date Noted  . Coronary artery disease involving coronary bypass graft of native heart   . Stented coronary artery   . Abnormal nuclear stress test 01/08/2015  . Vertigo, benign positional 06/25/2014  . Encounter for therapeutic drug monitoring 04/13/2013  . A-fib ([place]) 09/09/2011  . Chronic anticoagulation 09/09/2011  . Atrial fibrillation ([place]) 07/30/2011  . CAD (coronary artery disease)   . Mild aortic stenosis   . HTN (hypertension)   . Diabetes mellitus type 2, insulin dependent ([place])   . Hyperlipemia   . BPH (benign prostatic hyperplasia)     Zyanya Glaza L 01/16/2015, 9:17 AM  [facility] [address] [place], [place], [address] Phone: [phone]   Fax:  [phone]  Name: Frederik Standley MRN: [id number] Date of Birth: 1928-10-01  PHYSICAL THERAPY DISCHARGE SUMMARY  Visits from Start of Care: 2  Current functional level related to goals / functional outcomes:     PT Long Term Goals - 01/16/15 0912    PT LONG TERM GOAL #1   Title Pt will report 0/10 dizziness while while looking up, bending forward, turning head, rolling in bed, in order to perform ADLs safely. Target date: 01/31/15.   Status Achieved   PT LONG TERM GOAL #2   Title Perform FGA and write goal. [place] date: 01/31/15.   Status Deferred   PT LONG TERM GOAL #3   Title Pt will amb. 600' over even/uneven terrain at MOD I level to  improve functional mobility. Target date: 01/31/15.   Status Achieved   PT LONG TERM GOAL #4   Title Pt will be IND in HEP to improve dizziness, strength, flexibility, and endurance. Target date: 01/31/15.   Status Deferred   PT LONG TERM GOAL #5   Title Pt will improve DHI score from 40 to 22 in order to improve quality of life. Target date: 01/31/15.   Status Achieved        Remaining deficits: none   Education / Equipment: Education on BPPV and need for MD referral if BPPV returns.  Plan: Patient agrees to discharge.  Patient goals were met. Patient is being discharged due to meeting the stated rehab goals.  ?????       Geoffry Paradise, PT,DPT 01/16/2015 9:17 AM Phone: [phone] Fax: [phone]

## 2015-01-22 ENCOUNTER — Ambulatory Visit: Payer: Medicare Other | Admitting: Pharmacist Clinician (PhC)/ Clinical Pharmacy Specialist

## 2015-01-28 ENCOUNTER — Ambulatory Visit (INDEPENDENT_AMBULATORY_CARE_PROVIDER_SITE_OTHER): Payer: Medicare Other | Admitting: Physician Assistant

## 2015-01-28 ENCOUNTER — Encounter: Payer: Self-pay | Admitting: Physician Assistant

## 2015-01-28 ENCOUNTER — Ambulatory Visit (INDEPENDENT_AMBULATORY_CARE_PROVIDER_SITE_OTHER): Payer: Medicare Other | Admitting: Pharmacist Clinician (PhC)/ Clinical Pharmacy Specialist

## 2015-01-28 ENCOUNTER — Ambulatory Visit (INDEPENDENT_AMBULATORY_CARE_PROVIDER_SITE_OTHER): Payer: Medicare Other

## 2015-01-28 VITALS — BP 120/70 | HR 116 | Ht 66.0 in | Wt 226.2 lb

## 2015-01-28 DIAGNOSIS — I482 Chronic atrial fibrillation: Secondary | ICD-10-CM | POA: Diagnosis not present

## 2015-01-28 DIAGNOSIS — I4891 Unspecified atrial fibrillation: Secondary | ICD-10-CM | POA: Diagnosis not present

## 2015-01-28 DIAGNOSIS — I25810 Atherosclerosis of coronary artery bypass graft(s) without angina pectoris: Secondary | ICD-10-CM

## 2015-01-28 DIAGNOSIS — Z5181 Encounter for therapeutic drug level monitoring: Secondary | ICD-10-CM

## 2015-01-28 DIAGNOSIS — I10 Essential (primary) hypertension: Secondary | ICD-10-CM

## 2015-01-28 DIAGNOSIS — I4821 Permanent atrial fibrillation: Secondary | ICD-10-CM

## 2015-01-28 DIAGNOSIS — I2510 Atherosclerotic heart disease of native coronary artery without angina pectoris: Secondary | ICD-10-CM

## 2015-01-28 DIAGNOSIS — Z7901 Long term (current) use of anticoagulants: Secondary | ICD-10-CM

## 2015-01-28 MED ORDER — LISINOPRIL 5 MG PO TABS: 5.0000 mg | ORAL_TABLET | Freq: Every day | ORAL | Status: DC

## 2015-01-28 NOTE — Progress Notes (Signed)
Cardiology Office Note   Date:  01/28/2015   ID:  [name], DOB [birth year], MRN [id number]  PCP:  [name], MD  Cardiologist:  Dr Martinique  [name], [name], [name]   Chief Complaint  Patient presents with  . Follow-up    pt states no chest pain no SOB no light headedness or dizziness no edema    History of Present Illness: [name] is a 81 y.o. male with a history of CABG, DM, HTN, HL, Afib on coumadin. D/c 11/10 after abnl MV>cath> DES to RCA, SVG-D 100%, EF 55% by echo 11/09, mild AS with mean gradient 8 mm and peak gradient 16 mmHg.  [name] presents for post-hospital followup.   Since leaving the hospital, he has not had chest pain or shortness of breath. He does not have palpitations and has not been lightheaded or dizzy. He is compliant with his medications but wonders what they are all for.   He is tolerating the medications well and has only occasional problems with ecchymosis being on dual antiplatelet therapy and Coumadin.  He has a home blood pressure cuff and is taken his blood pressure before, but does not feel the cuff is working well as the readings he says are very variable.   Past Medical History  Diagnosis Date  . Mild aortic stenosis   . HTN (hypertension)   . Hyperlipemia   . Bladder outlet obstruction   . BPH (benign prostatic hyperplasia)   . Kidney stones   . Abdominal pain   . Constipation   . Bladder outlet obstruction   . Acute renal failure ([place])      resolved  . Urinary tract infection      Enterococcus  . Rib fractures     left rib fractures being treated with pain medications  . CAD (coronary artery disease)     a. CABG IN 1989. b. 01/08/2015 CTO of ost LAD, LIMA to LAD not visualized but assumed patent given myoview finding, occluded SVG to diagonal, 99% mid RCA tx w/ SYNERGY DES 3X28 mm  . Diabetes mellitus type 2, insulin dependent ([place])   . Atrial fibrillation ([place])   . GERD (gastroesophageal reflux  disease)   . Arthritis     "hands & legs" (11/'10/2014)    Past Surgical History  Procedure Laterality Date  . Coronary artery bypass graft  1989    "CABG X 2"  . Total hip arthroplasty Right 2000  . Cardiac catheterization  1989  . Carpal tunnel release Right 08/2010  . Cataract extraction w/ intraocular lens  implant, bilateral Bilateral   . Joint replacement    . Cardiac catheterization N/A 01/08/2015    Procedure: Left Heart Cath and Cors/Grafts Angiography;  Surgeon: Peter M Martinique, MD;  Location: [facility];  Service: Cardiovascular;  Laterality: N/A;  . Cardiac catheterization  01/08/2015    Procedure: Coronary Stent Intervention;  Surgeon: Peter M Martinique, MD;  Location: [facility];  Service: Cardiovascular;;  . Tonsillectomy    . Coronary angioplasty      Current Outpatient Prescriptions  Medication Sig Dispense Refill  . aspirin 81 MG chewable tablet Chew 1 tablet (81 mg total) by mouth daily.    . AVODART 0.5 MG capsule Take 0.5 mg by mouth every other day.     . clopidogrel (PLAVIX) 75 MG tablet Take 1 tablet (75 mg total) by mouth daily with breakfast. 90 tablet 3  . diltiazem (CARDIZEM CD) 360 MG  24 hr capsule Take 1 capsule (360 mg total) by mouth daily. 30 capsule 6  . glucose blood (PRECISION XTRA TEST STRIPS) test strip 1 each by Other route See admin instructions. Check blood sugar once daily    . hydrochlorothiazide (MICROZIDE) 12.5 MG capsule Take 12.5 mg by mouth daily.     . insulin glargine (LANTUS) 100 UNIT/ML injection Inject 32 Units into the skin daily.     . metFORMIN (GLUCOPHAGE) 500 MG tablet Take 1,000 mg by mouth 2 (two) times daily with a meal.     . metoprolol succinate (TOPROL-XL) 100 MG 24 hr tablet Take 100 mg by mouth daily.     . nitroGLYCERIN (NITROSTAT) 0.4 MG SL tablet Place 1 tablet (0.4 mg total) under the tongue every 5 (five) minutes as needed. 25 tablet 3  . pantoprazole (PROTONIX) 40 MG tablet Take 1 tablet (40 mg total) by  mouth daily at 12 noon. 90 tablet 3  . simvastatin (ZOCOR) 40 MG tablet Take 20 mg by mouth daily.     . Tamsulosin HCl (FLOMAX) 0.4 MG CAPS Take 0.4 mg by mouth every other day.     . warfarin (COUMADIN) 5 MG tablet 1 tablet daily except 1.5 tablets on Wednesdays or as directed by coumadin clinic (Patient taking differently: Take 5-7.5 mg by mouth daily. Take 5mg  by mouth daily except take 7.5 mg by mouth on Sunday and Wednesday.) 120 tablet 1   No current facility-administered medications for this visit.    Allergies:   Amoxicillin; Cefadroxil; Ciprofloxacin; and Erythromycin    Social History:  The patient  reports that he quit smoking about 56 years ago. His smoking use included Cigarettes. He has a 30 pack-year smoking history. He has never used smokeless tobacco. He reports that he does not drink alcohol or use illicit drugs.   Family History:  The patient's family history includes Brain cancer in his father; Throat cancer in his brother.    ROS:  Please see the history of present illness. All other systems are reviewed and negative.    PHYSICAL EXAM: VS:  BP 120/70 mmHg  Pulse 116  Ht 5\' 6"  (1.676 m)  Wt 226 lb 3.2 oz (102.604 kg)  BMI 36.53 kg/m2 , BMI Body mass index is 36.53 kg/(m^2). GEN: Well nourished, well developed, male in no acute distress HEENT: normal for age  Neck: no JVD, no carotid bruit, no masses Cardiac: Irregularly irregular; + murmur, no rubs, or gallops Respiratory: Few rales bases bilaterally, normal work of breathing GI: soft, nontender, nondistended, + BS MS: no deformity or atrophy; no edema; distal pulses are 2+ in all 4 extremities, small area of ecchymosis on the left hand that is healing well Skin: warm and dry, no rash Neuro:  Strength and sensation are intact Psych: euthymic mood, full affect   EKG:  EKG is ordered today. The ekg ordered today demonstrates atrial fibrillation, rapid ventricular response with a heart rate of 116   Recent  Labs: 01/09/2015: BUN 11; Creatinine, Ser 0.99; Hemoglobin 11.7*; Platelets 165; Potassium 4.4; Sodium 140    Lipid Panel No results found for: CHOL, TRIG, HDL, CHOLHDL, VLDL, LDLCALC, LDLDIRECT   Wt Readings from Last 3 Encounters:  01/28/15 226 lb 3.2 oz (102.604 kg)  01/09/15 232 lb 5.8 oz (105.4 kg)  12/27/14 225 lb (102.059 kg)     Other studies Reviewed: Additional studies/ records that were reviewed today include: Recent hospital records and previous office notes.  ASSESSMENT AND PLAN:  1.  CAD with DES to the RCA and occluded SVG-diagonal by recent cath, LIMA-LAD not visualized but assumed patent: Mr. Vollrath is tolerating his medications well and having no problems with them. He understands that he is to quit taking aspirin after 30 days but continue taking the Plavix as he will also be on Coumadin.  2. Atrial fibrillation: He is compliant with diltiazem 360 and Toprol-XL 100 mg daily. He is tried to check his blood pressure on her home machine but states the readings are very variable in its unclear if that is because of the atrial fibrillation. Upon review of hospital records, his heart rate was in the 40s at times but is currently elevated today. I rechecked his pulse after he had been sitting showed a heart rate in the high 80s. I feel bradycardia would do him more damage than tachycardia at this point so we'll make no medication changes at this time.  3. Aortic sclerosis: He has a murmur but is asymptomatic  4. Chronic anticoagulation with Coumadin:  This patients CHA2DS2-VASc Score and unadjusted Ischemic Stroke Rate (% per year) is equal to 9.7 % stroke rate/year from a score of 6 Above score calculated as 1 point each if present [CHF, HTN, DM, Vascular=MI/PAD/Aortic Plaque, Age if 65-74, or Male], 2 points each if present [Age > 75, or Stroke/TIA/TE]  He will get a Coumadin check today.  5. Medication questions: On his medication list, I wrote down the reason for all of  his medications whether blood pressure, cardiac or blood thinners. The patient was appreciated for this.  6. Hypertension: His blood pressure is borderline elevated. He was on lisinopril 40 mg prior to being put on medications for rate control of his atrial fibrillation. However, as a diabetic, an ACE inhibitor is recommended. Therefore, we will start lisinopril at 5 mg daily and see how he tolerates this.   Current medicines are reviewed at length with the patient today.  The patient has concerns regarding medicines. All concerns were addressed   The following changes have been made:  Lisinopril 5 mg daily  Labs/ tests ordered today include:   Orders Placed This Encounter  Procedures  . EKG 12-Lead     Disposition:   FU with Dr. Martinique  Signed, Rosaria Ferries, [name]  01/28/2015 1:04 PM    [facility] [place], [place], [place]  [address] Phone: [phone]; Fax: [phone]

## 2015-01-28 NOTE — Patient Instructions (Signed)
Your physician has recommended you make the following change in your medication: start new prescription for lisinopril 5 mg . This [facility].  Your physician recommends that you schedule a follow-up appointment in: 3 months with Dr. Martinique.

## 2015-01-29 LAB — POCT INR: INR: 2.2

## 2015-01-30 ENCOUNTER — Telehealth: Payer: Self-pay | Admitting: Cardiology

## 2015-01-30 NOTE — Telephone Encounter (Signed)
Returned call to patient.He stated he needs a letter from [place] stating he is unable to fly due to his heart condition and vertigo.Stated his son bought tickets to [place] and air line will not reimburse without a Dr letter.Message sent to [place].

## 2015-01-30 NOTE — Telephone Encounter (Signed)
Please call.

## 2015-01-31 NOTE — Telephone Encounter (Signed)
OK to send letter to this effect.   [name] Martinique MD, [facility]

## 2015-02-03 NOTE — Telephone Encounter (Signed)
Letter faxed to patient's daughter n law Danae Chen at fax # [phone].

## 2015-02-20 ENCOUNTER — Encounter (HOSPITAL_COMMUNITY)
Admission: RE | Admit: 2015-02-20 | Discharge: 2015-02-20 | Disposition: A | Discharge status: Home / Self Care | Payer: Medicare Other | Source: Ambulatory Visit | Attending: Cardiology | Admitting: Cardiology

## 2015-02-20 DIAGNOSIS — Z955 Presence of coronary angioplasty implant and graft: Secondary | ICD-10-CM | POA: Insufficient documentation

## 2015-02-20 NOTE — Progress Notes (Signed)
Cardiac Rehab Medication Review by a Pharmacist  Does the patient  feel that his/her medications are working for him/her?  Yes  Has the patient been experiencing any side effects to the medications prescribed?  No  Does the patient measure his/her own blood pressure or blood glucose at home?  He measures his bleed glucose at home.  Does the patient have any problems obtaining medications due to transportation or finances?   No  Understanding of regimen: poor Understanding of indications: fair Potential of compliance: fair  Pharmacist comments: Patient did not have a great understanding of his medications. We talked about why he is taking each medication and their importance. He endorsed understanding and had a few questions. He states that his acid reflux has been getting worse since his stent was placed. The cardiologist has changed his Protonix dose to at lunchtime instead of before breakfast (assuming to avoid interaction with plavix). I explained this to the patient sand he stated that he understood.   Darielle Hancher C. Lennox Grumbles, PharmD Pharmacy Resident  Pager: [phone] 02/20/2015 8:40 AM

## 2015-02-25 ENCOUNTER — Ambulatory Visit (INDEPENDENT_AMBULATORY_CARE_PROVIDER_SITE_OTHER): Payer: Medicare Other | Admitting: Pharmacist Clinician (PhC)/ Clinical Pharmacy Specialist

## 2015-02-25 DIAGNOSIS — Z5181 Encounter for therapeutic drug level monitoring: Secondary | ICD-10-CM

## 2015-02-25 DIAGNOSIS — I2510 Atherosclerotic heart disease of native coronary artery without angina pectoris: Secondary | ICD-10-CM

## 2015-02-25 DIAGNOSIS — Z7901 Long term (current) use of anticoagulants: Secondary | ICD-10-CM

## 2015-02-25 DIAGNOSIS — I10 Essential (primary) hypertension: Secondary | ICD-10-CM

## 2015-02-25 DIAGNOSIS — I4891 Unspecified atrial fibrillation: Secondary | ICD-10-CM | POA: Diagnosis not present

## 2015-02-25 LAB — POCT INR: INR: 2.5

## 2015-02-26 ENCOUNTER — Encounter (HOSPITAL_COMMUNITY)
Admission: RE | Admit: 2015-02-26 | Discharge: 2015-02-26 | Disposition: A | Discharge status: Home / Self Care | Payer: Medicare Other | Source: Ambulatory Visit | Attending: Cardiology | Admitting: Cardiology

## 2015-02-26 DIAGNOSIS — Z955 Presence of coronary angioplasty implant and graft: Secondary | ICD-10-CM | POA: Diagnosis not present

## 2015-02-26 LAB — GLUCOSE, CAPILLARY
Glucose-Capillary: 189 mg/dL — ABNORMAL HIGH (ref 65–99)
Glucose-Capillary: 159 mg/dL — ABNORMAL HIGH (ref 65–99)

## 2015-02-26 NOTE — Progress Notes (Signed)
Pt started exercise today in the phase II cardiac rehab program. Pt is attending an earlier class than his scheduled 9:45 exercise class due to out of town company visiting for the holiday. Pt tolerated light exercise without difficulty. VSS, blood glucose within normal limits,telemetry-afib.  This is chronic for this patient.  Pt with recent INR check on yesterday. His levels were therapeutic at 2.5. Medication list reconciled.  Pt verbalized compliance with medications and denies barriers to compliance. PSYCHOSOCIAL ASSESSMENT:  PHQ-0.  Pt with supportive family.  Pt wife was present for orientation and is here today for his first visit. Pt observed is very jovial and social with other participants.  Pt states  Pt exhibits positive coping skills, hopeful outlook.  No psychosocial needs identified at this time, no psychosocial interventions necessary.    Pt enjoys [employer] baseball .   Pt cardiac rehab  goal is  to increase strength and increase walking distance.  Pt readily admits that he was very sedentary over the last year. Prior to this he was at the gym 4-5 times a week with his wife.  Pt encouraged to participate in home exercise and education classes to increase ability to achieve these goals.   Pt long term cardiac rehab goal is able to do physical activities without restrictions.  Pt chores around the home include vacuum and washing dishes. Will periodically check in with pt to assess his progress toward meeting this goal.Pt oriented to exercise equipment and routine.  Understanding verbalized. Cherre Huger, BSN

## 2015-02-28 ENCOUNTER — Encounter (HOSPITAL_COMMUNITY)
Admission: RE | Admit: 2015-02-28 | Discharge: 2015-02-28 | Disposition: A | Discharge status: Home / Self Care | Payer: Medicare Other | Source: Ambulatory Visit | Attending: Cardiology | Admitting: Cardiology

## 2015-02-28 DIAGNOSIS — Z955 Presence of coronary angioplasty implant and graft: Secondary | ICD-10-CM | POA: Diagnosis not present

## 2015-02-28 LAB — GLUCOSE, CAPILLARY
Glucose-Capillary: 124 mg/dL — ABNORMAL HIGH (ref 65–99)
Glucose-Capillary: 108 mg/dL — ABNORMAL HIGH (ref 65–99)

## 2015-03-04 ENCOUNTER — Telehealth (HOSPITAL_COMMUNITY): Payer: Self-pay | Admitting: *Deleted

## 2015-03-04 NOTE — Telephone Encounter (Signed)
-----   Message from Peter M Martinique, MD sent at 03/01/2015  9:19 AM EST ----- Regarding: RE: Target Heart Rate Increase I would say 140 for now.  Peter Martinique MD, [facility]   ----- Message -----    From: Clotilde Dieter    Sent: 02/26/2015   8:57 AM      To: Peter M Martinique, MD Subject: Target Heart Rate Increase                     Dr. Martinique,  Nick started Cardiac Rehab today.  His target heart rate is 55-109, which with his afib he is easily exceeding.  What is a safe upper limit for him during exercise?  (150?)  During his walk test he maxxed around 122 bpm.  Thanks for your advice! Alberteen Sam, MA, ACSM RCEP

## 2015-03-05 ENCOUNTER — Encounter (HOSPITAL_COMMUNITY): Payer: Medicare Other

## 2015-03-05 ENCOUNTER — Encounter (HOSPITAL_COMMUNITY)
Admission: RE | Admit: 2015-03-05 | Discharge: 2015-03-05 | Disposition: A | Discharge status: Home / Self Care | Payer: Medicare Other | Source: Ambulatory Visit | Attending: Cardiology | Admitting: Cardiology

## 2015-03-05 DIAGNOSIS — Z955 Presence of coronary angioplasty implant and graft: Secondary | ICD-10-CM | POA: Insufficient documentation

## 2015-03-05 LAB — GLUCOSE, CAPILLARY
Glucose-Capillary: 165 mg/dL — ABNORMAL HIGH (ref 65–99)
Glucose-Capillary: 172 mg/dL — ABNORMAL HIGH (ref 65–99)

## 2015-03-06 ENCOUNTER — Ambulatory Visit (INDEPENDENT_AMBULATORY_CARE_PROVIDER_SITE_OTHER): Payer: Medicare Other | Admitting: Podiatry

## 2015-03-06 DIAGNOSIS — M79676 Pain in unspecified toe(s): Secondary | ICD-10-CM | POA: Diagnosis not present

## 2015-03-06 DIAGNOSIS — B351 Tinea unguium: Secondary | ICD-10-CM | POA: Diagnosis not present

## 2015-03-06 DIAGNOSIS — M79675 Pain in left toe(s): Secondary | ICD-10-CM

## 2015-03-06 DIAGNOSIS — M79674 Pain in right toe(s): Secondary | ICD-10-CM | POA: Diagnosis not present

## 2015-03-06 NOTE — Progress Notes (Signed)
Patient ID: [name], male   DOB: 10/19/1928, 82 y.o.   MRN: [id number] Complaint:  Visit Type: Patient returns to my office for continued preventative foot care services. Complaint: Patient states" my nails have grown long and thick and become painful to walk and wear shoes" Patient has been diagnosed with DM with no foot  complications. He presents for preventative foot care services. No changes to ROS  Podiatric Exam: Vascular: dorsalis pedis and posterior tibial pulses are palpable bilateral. Capillary return is immediate. Temperature gradient is WNL. Skin turgor WNL  Sensorium: Normal Semmes Weinstein monofilament test. Normal tactile sensation bilaterally. Nail Exam: Pt has thick disfigured discolored nails with subungual debris noted bilateral entire nail hallux through fifth toenails Ulcer Exam: There is no evidence of ulcer or pre-ulcerative changes or infection. Orthopedic Exam: Muscle tone and strength are WNL. No limitations in general ROM. No crepitus or effusions noted. Foot type and digits show no abnormalities. Bony prominences are unremarkable. Skin: No Porokeratosis. No infection or ulcers  Diagnosis:  Tinea unguium, Pain in right toe, pain in left toes  Treatment & Plan Procedures and Treatment: Consent by patient was obtained for treatment procedures. The patient understood the discussion of treatment and procedures well. All questions were answered thoroughly reviewed. Debridement of mycotic and hypertrophic toenails, 1 through 5 bilateral and clearing of subungual debris. No ulceration, no infection noted.  Return Visit-Office Procedure: Patient instructed to return to the office for a follow up visit 3 months for continued evaluation and treatment. 

## 2015-03-07 ENCOUNTER — Ambulatory Visit: Payer: Medicare Other | Admitting: Podiatry

## 2015-03-07 ENCOUNTER — Encounter (HOSPITAL_COMMUNITY)
Admission: RE | Admit: 2015-03-07 | Discharge: 2015-03-07 | Disposition: A | Discharge status: Home / Self Care | Payer: Medicare Other | Source: Ambulatory Visit | Attending: Cardiology | Admitting: Cardiology

## 2015-03-07 ENCOUNTER — Encounter (HOSPITAL_COMMUNITY): Payer: Medicare Other

## 2015-03-07 DIAGNOSIS — Z955 Presence of coronary angioplasty implant and graft: Secondary | ICD-10-CM | POA: Diagnosis not present

## 2015-03-07 LAB — GLUCOSE, CAPILLARY
Glucose-Capillary: 165 mg/dL — ABNORMAL HIGH (ref 65–99)
Glucose-Capillary: 146 mg/dL — ABNORMAL HIGH (ref 65–99)

## 2015-03-10 ENCOUNTER — Encounter (HOSPITAL_COMMUNITY): Payer: Medicare Other

## 2015-03-11 ENCOUNTER — Emergency Department (HOSPITAL_COMMUNITY)
Admission: EM | Admit: 2015-03-11 | Discharge: 2015-03-11 | Disposition: A | Discharge status: Home / Self Care | Payer: Medicare Other | Attending: Emergency Medicine | Admitting: Emergency Medicine

## 2015-03-11 ENCOUNTER — Telehealth: Payer: Self-pay | Admitting: Cardiology

## 2015-03-11 ENCOUNTER — Encounter (HOSPITAL_COMMUNITY): Payer: Self-pay

## 2015-03-11 DIAGNOSIS — Z880 Allergy status to penicillin: Secondary | ICD-10-CM | POA: Insufficient documentation

## 2015-03-11 DIAGNOSIS — K5900 Constipation, unspecified: Secondary | ICD-10-CM | POA: Insufficient documentation

## 2015-03-11 DIAGNOSIS — X58XXXA Exposure to other specified factors, initial encounter: Secondary | ICD-10-CM | POA: Diagnosis not present

## 2015-03-11 DIAGNOSIS — Z79899 Other long term (current) drug therapy: Secondary | ICD-10-CM | POA: Diagnosis not present

## 2015-03-11 DIAGNOSIS — N400 Enlarged prostate without lower urinary tract symptoms: Secondary | ICD-10-CM | POA: Insufficient documentation

## 2015-03-11 DIAGNOSIS — I4891 Unspecified atrial fibrillation: Secondary | ICD-10-CM | POA: Diagnosis not present

## 2015-03-11 DIAGNOSIS — Z9861 Coronary angioplasty status: Secondary | ICD-10-CM | POA: Insufficient documentation

## 2015-03-11 DIAGNOSIS — N501 Vascular disorders of male genital organs: Secondary | ICD-10-CM

## 2015-03-11 DIAGNOSIS — Z87442 Personal history of urinary calculi: Secondary | ICD-10-CM | POA: Diagnosis not present

## 2015-03-11 DIAGNOSIS — Z9889 Other specified postprocedural states: Secondary | ICD-10-CM | POA: Insufficient documentation

## 2015-03-11 DIAGNOSIS — Z7902 Long term (current) use of antithrombotics/antiplatelets: Secondary | ICD-10-CM | POA: Insufficient documentation

## 2015-03-11 DIAGNOSIS — Z87440 Personal history of urinary (tract) infections: Secondary | ICD-10-CM | POA: Diagnosis not present

## 2015-03-11 DIAGNOSIS — Z7901 Long term (current) use of anticoagulants: Secondary | ICD-10-CM | POA: Insufficient documentation

## 2015-03-11 DIAGNOSIS — Z951 Presence of aortocoronary bypass graft: Secondary | ICD-10-CM | POA: Diagnosis not present

## 2015-03-11 DIAGNOSIS — Y9389 Activity, other specified: Secondary | ICD-10-CM | POA: Insufficient documentation

## 2015-03-11 DIAGNOSIS — I10 Essential (primary) hypertension: Secondary | ICD-10-CM | POA: Insufficient documentation

## 2015-03-11 DIAGNOSIS — Z8781 Personal history of (healed) traumatic fracture: Secondary | ICD-10-CM | POA: Insufficient documentation

## 2015-03-11 DIAGNOSIS — Z7984 Long term (current) use of oral hypoglycemic drugs: Secondary | ICD-10-CM | POA: Diagnosis not present

## 2015-03-11 DIAGNOSIS — E119 Type 2 diabetes mellitus without complications: Secondary | ICD-10-CM | POA: Insufficient documentation

## 2015-03-11 DIAGNOSIS — M1990 Unspecified osteoarthritis, unspecified site: Secondary | ICD-10-CM | POA: Diagnosis not present

## 2015-03-11 DIAGNOSIS — E785 Hyperlipidemia, unspecified: Secondary | ICD-10-CM | POA: Diagnosis not present

## 2015-03-11 DIAGNOSIS — Z87891 Personal history of nicotine dependence: Secondary | ICD-10-CM | POA: Diagnosis not present

## 2015-03-11 DIAGNOSIS — I2510 Atherosclerotic heart disease of native coronary artery without angina pectoris: Secondary | ICD-10-CM | POA: Insufficient documentation

## 2015-03-11 DIAGNOSIS — S3994XA Unspecified injury of external genitals, initial encounter: Secondary | ICD-10-CM | POA: Diagnosis present

## 2015-03-11 DIAGNOSIS — Z794 Long term (current) use of insulin: Secondary | ICD-10-CM | POA: Diagnosis not present

## 2015-03-11 DIAGNOSIS — Y998 Other external cause status: Secondary | ICD-10-CM | POA: Insufficient documentation

## 2015-03-11 DIAGNOSIS — Y9289 Other specified places as the place of occurrence of the external cause: Secondary | ICD-10-CM | POA: Diagnosis not present

## 2015-03-11 DIAGNOSIS — S3133XA Puncture wound without foreign body of scrotum and testes, initial encounter: Secondary | ICD-10-CM | POA: Diagnosis not present

## 2015-03-11 DIAGNOSIS — K219 Gastro-esophageal reflux disease without esophagitis: Secondary | ICD-10-CM | POA: Insufficient documentation

## 2015-03-11 DIAGNOSIS — R58 Hemorrhage, not elsewhere classified: Secondary | ICD-10-CM | POA: Diagnosis not present

## 2015-03-11 DIAGNOSIS — Z7982 Long term (current) use of aspirin: Secondary | ICD-10-CM | POA: Diagnosis not present

## 2015-03-11 LAB — PROTIME-INR
INR: 2.48 — ABNORMAL HIGH (ref 0.00–1.49)
Prothrombin Time: 26.6 seconds — ABNORMAL HIGH (ref 11.6–15.2)

## 2015-03-11 NOTE — ED Provider Notes (Signed)
CSN: [id number]     Arrival date & time 03/11/15  1821 History   First MD Initiated Contact with Patient 03/11/15 1831     No chief complaint on file.    (Consider location/radiation/quality/duration/timing/severity/associated sxs/prior Treatment) HPI Patient noticed bleeding from the right side of scrotum this morning while showering. EMS was called but the bleeding is stopped. He has small about bleeding started again around 5 PM. Called EMS. Unsure of amount of total loss of blood. Patient is on warfarin for atrial fibrillation. Had his last INR checked last week and it was 2.5. Denies any lightheadedness. Denies scrotal swelling or pain. Past Medical History  Diagnosis Date  . Mild aortic stenosis   . HTN (hypertension)   . Hyperlipemia   . Bladder outlet obstruction   . BPH (benign prostatic hyperplasia)   . Kidney stones   . Abdominal pain   . Constipation   . Bladder outlet obstruction   . Acute renal failure ([place])      resolved  . Urinary tract infection      Enterococcus  . Rib fractures     left rib fractures being treated with pain medications  . CAD (coronary artery disease)     a. CABG IN 1989. b. 01/08/2015 CTO of ost LAD, LIMA to LAD not visualized but assumed patent given myoview finding, occluded SVG to diagonal, 99% mid RCA tx w/ SYNERGY DES 3X28 mm  . Diabetes mellitus type 2, insulin dependent ([place])   . Atrial fibrillation ([place])   . GERD (gastroesophageal reflux disease)   . Arthritis     "hands & legs" (11/'10/2014)   Past Surgical History  Procedure Laterality Date  . Coronary artery bypass graft  1989    "CABG X 2"  . Total hip arthroplasty Right 2000  . Cardiac catheterization  1989  . Carpal tunnel release Right 08/2010  . Cataract extraction w/ intraocular lens  implant, bilateral Bilateral   . Joint replacement    . Cardiac catheterization N/A 01/08/2015    Procedure: Left Heart Cath and Cors/Grafts Angiography;  Surgeon: Peter M Martinique, MD;   Location: [facility];  Service: Cardiovascular;  Laterality: N/A;  . Cardiac catheterization  01/08/2015    Procedure: Coronary Stent Intervention;  Surgeon: Peter M Martinique, MD;  Location: [facility];  Service: Cardiovascular;;  . Tonsillectomy    . Coronary angioplasty     Family History  Problem Relation Age of Onset  . Brain cancer Father   . Throat cancer Brother    Social History  Substance Use Topics  . Smoking status: Former Smoker -- 3.00 packs/day for 10 years    Types: Cigarettes    Quit date: 05/11/1958  . Smokeless tobacco: Never Used  . Alcohol Use: No    Review of Systems  Constitutional: Negative for fever and chills.  Respiratory: Negative for shortness of breath.   Cardiovascular: Negative for chest pain.  Gastrointestinal: Negative for nausea, vomiting and abdominal pain.  Genitourinary: Negative for penile swelling, scrotal swelling, penile pain and testicular pain.  Skin: Positive for wound.  Neurological: Negative for dizziness, syncope, weakness and light-headedness.  All other systems reviewed and are negative.     Allergies  Amoxicillin; Cefadroxil; Ciprofloxacin; and Erythromycin  Home Medications   Prior to Admission medications   Medication Sig Start Date End Date Taking? Authorizing Provider  aspirin 81 MG chewable tablet Chew 1 tablet (81 mg total) by mouth daily. 01/09/15   Almyra Deforest,  PA  AVODART 0.5 MG capsule Take 0.5 mg by mouth every other day.  07/20/11   Historical Provider, MD  clopidogrel (PLAVIX) 75 MG tablet Take 1 tablet (75 mg total) by mouth daily with breakfast. 01/09/15   Almyra Deforest, PA  diltiazem (CARDIZEM CD) 360 MG 24 hr capsule Take 1 capsule (360 mg total) by mouth daily. 01/10/15   Peter M Martinique, MD  glucose blood (PRECISION XTRA TEST STRIPS) test strip 1 each by Other route See admin instructions. Check blood sugar once daily 11/08/13   Historical Provider, MD  hydrochlorothiazide (MICROZIDE) 12.5 MG capsule  Take 12.5 mg by mouth daily.  06/17/14 06/17/15  Historical Provider, MD  insulin glargine (LANTUS) 100 UNIT/ML injection Inject 32 Units into the skin daily.     Historical Provider, MD  lisinopril (PRINIVIL,ZESTRIL) 5 MG tablet Take 1 tablet (5 mg total) by mouth daily. 01/28/15   [name] G Barrett, [name]  metFORMIN (GLUCOPHAGE) 500 MG tablet Take 1,000 mg by mouth 2 (two) times daily with a meal.     Historical Provider, MD  metoprolol succinate (TOPROL-XL) 100 MG 24 hr tablet Take 100 mg by mouth daily.  11/22/13   Historical Provider, MD  nitroGLYCERIN (NITROSTAT) 0.4 MG SL tablet Place 1 tablet (0.4 mg total) under the tongue every 5 (five) minutes as needed. 01/09/15   Almyra Deforest, PA  pantoprazole (PROTONIX) 40 MG tablet Take 1 tablet (40 mg total) by mouth daily at 12 noon. 01/09/15   Almyra Deforest, PA  simvastatin (ZOCOR) 40 MG tablet Take 20 mg by mouth daily.     Historical Provider, MD  Tamsulosin HCl (FLOMAX) 0.4 MG CAPS Take 0.4 mg by mouth every other day.     Historical Provider, MD  warfarin (COUMADIN) 5 MG tablet 1 tablet daily except 1.5 tablets on Wednesdays or as directed by coumadin clinic Patient taking differently: Take 5-7.5 mg by mouth daily. Take 5mg  by mouth daily except take 7.5 mg by mouth on Sunday and Wednesday. 08/16/14   Peter M Martinique, MD   BP 106/56 mmHg  Pulse 84  Temp(Src) 98.3 F (36.8 C) (Oral)  Resp 16  Ht 5\' 6"  (1.676 m)  Wt 222 lb (100.699 kg)  BMI 35.85 kg/m2  SpO2 100% Physical Exam  Constitutional: He is oriented to person, place, and time. He appears well-developed and well-nourished. No distress.  HENT:  Head: Normocephalic and atraumatic.  Mouth/Throat: Oropharynx is clear and moist.  Eyes: EOM are normal. Pupils are equal, round, and reactive to light.  Neck: Normal range of motion. Neck supple.  Cardiovascular: Normal rate and regular rhythm.   Pulmonary/Chest: Effort normal and breath sounds normal.  Abdominal: Soft. Bowel sounds are normal.   Genitourinary:  Patient with small puncture wound to the right side of the scrotum. There is a small amount of bleeding coming from the site. There is no underlying erythema or masses. No testicular tenderness.  Musculoskeletal: Normal range of motion. He exhibits no edema or tenderness.  Neurological: He is alert and oriented to person, place, and time.  Skin: Skin is warm and dry. No rash noted. No erythema.  Psychiatric: He has a normal mood and affect. His behavior is normal.  Nursing note and vitals reviewed.   ED Course  Procedures (including critical care time) Labs Review Labs Reviewed  PROTIME-INR - Abnormal; Notable for the following:    Prothrombin Time 26.6 (*)    INR 2.48 (*)    All other components within normal  limits    Imaging Review No results found. I have personally reviewed and evaluated these images and lab results as part of my medical decision-making.   EKG Interpretation None      MDM   Final diagnoses:  Scrotal bleeding   Silver nitrate sticks used to slow bleeding and the bleeding stopped with pressure. We'll apply a bulky dressing. Patient advised against scrubbing the area, excessive walking or running for 1-2 days until this is healed. If rebleeding occurs, is advised to hold pressure for 10-15 minutes.  INR is within normal limits.   Julianne Rice, MD 03/11/15 2001

## 2015-03-11 NOTE — Telephone Encounter (Signed)
Spoke with pt, when he got out of the shower this morning he had blood dripping on the floor. His wife reported the blood was coming from his testicle. They called EMS and apparently he has an irritation or rash on his testicle. By the time EMS got to his home it had stopped. He is currently fine and will contact his medical doctor if he has further problems.

## 2015-03-11 NOTE — Discharge Instructions (Signed)
Hold pressure to scrotum 10-15 minutes if rebleeding occurs. If continued bleeding, return to the emergency department. Return for any obvious redness or warmth or swelling to the area.

## 2015-03-11 NOTE — Telephone Encounter (Signed)
I thought triage nurse was going to talk to pt and document. She told me to tell the pt's wife to take him to his [facility] or the ER. Pt was bleeding from his testicle.

## 2015-03-11 NOTE — Telephone Encounter (Signed)
Message is incomplete.  Will send this message back to the original person who sent this, to provide more information about this pt.

## 2015-03-11 NOTE — ED Notes (Signed)
To room via EMS.  Pt reports got out of shower this morning, while cleaning shower pt noticed blood on floor.  Bleeding was noted from right side of scrotum, EMS was called by the time they arrived bleeding had stopped.   Bleeding restarted around 5pm .  No bleeding noted at this time.  NO dizziness or pain noted.

## 2015-03-12 ENCOUNTER — Encounter (HOSPITAL_COMMUNITY): Payer: Medicare Other

## 2015-03-12 ENCOUNTER — Telehealth (HOSPITAL_COMMUNITY): Payer: Self-pay | Admitting: Family Medicine

## 2015-03-14 ENCOUNTER — Encounter (HOSPITAL_COMMUNITY): Payer: Medicare Other

## 2015-03-17 ENCOUNTER — Encounter (HOSPITAL_COMMUNITY): Payer: Medicare Other

## 2015-03-17 ENCOUNTER — Other Ambulatory Visit: Payer: Self-pay | Admitting: Physician Assistant

## 2015-03-17 ENCOUNTER — Encounter (HOSPITAL_COMMUNITY)
Admission: RE | Admit: 2015-03-17 | Discharge: 2015-03-17 | Disposition: A | Discharge status: Home / Self Care | Payer: Medicare Other | Source: Ambulatory Visit | Attending: Cardiology | Admitting: Cardiology

## 2015-03-17 DIAGNOSIS — Z955 Presence of coronary angioplasty implant and graft: Secondary | ICD-10-CM | POA: Diagnosis not present

## 2015-03-17 DIAGNOSIS — I10 Essential (primary) hypertension: Secondary | ICD-10-CM

## 2015-03-17 LAB — GLUCOSE, CAPILLARY
Glucose-Capillary: 173 mg/dL — ABNORMAL HIGH (ref 65–99)
Glucose-Capillary: 180 mg/dL — ABNORMAL HIGH (ref 65–99)

## 2015-03-17 NOTE — Progress Notes (Signed)
Reviewed home exercise with pt today.  Pt plans to walk at home for exercise.  He and his wife are also planning to renew their membership to the [facility].  Reviewed THR, pulse, RPE, sign and symptoms, NTG use, and when to call 911 or MD.  Pt voiced understanding. Alberteen Sam, MA, ACSM RCEP

## 2015-03-18 ENCOUNTER — Other Ambulatory Visit: Payer: Self-pay

## 2015-03-18 ENCOUNTER — Telehealth: Payer: Self-pay

## 2015-03-18 ENCOUNTER — Telehealth: Payer: Self-pay | Admitting: Cardiology

## 2015-03-18 DIAGNOSIS — I10 Essential (primary) hypertension: Secondary | ICD-10-CM | POA: Diagnosis not present

## 2015-03-18 DIAGNOSIS — E785 Hyperlipidemia, unspecified: Secondary | ICD-10-CM

## 2015-03-18 NOTE — Telephone Encounter (Signed)
Pt says he wants to speak to you,he would not give any details.

## 2015-03-18 NOTE — Telephone Encounter (Signed)
Returned call to patient.He stated he could not remember why he needed a bmet.Advised he was started on lisinopril and a bmet needs to be checked.Stated he will have done 03/19/15 at [facility] lab.

## 2015-03-18 NOTE — Telephone Encounter (Signed)
Spoke to patient Jeffrey Ferries PA reviewed B/P's and heart rates you left her.She advised looked good.Advised needs bmet next time you go to rehab.Stated he will have a bmet 03/19/15 at [facility] lab.

## 2015-03-19 ENCOUNTER — Encounter (HOSPITAL_COMMUNITY)
Admission: RE | Admit: 2015-03-19 | Discharge: 2015-03-19 | Disposition: A | Discharge status: Home / Self Care | Payer: Medicare Other | Source: Ambulatory Visit | Attending: Cardiology | Admitting: Cardiology

## 2015-03-19 ENCOUNTER — Encounter (HOSPITAL_COMMUNITY): Payer: Medicare Other

## 2015-03-19 DIAGNOSIS — Z955 Presence of coronary angioplasty implant and graft: Secondary | ICD-10-CM | POA: Diagnosis not present

## 2015-03-19 LAB — BASIC METABOLIC PANEL
BUN: 17 mg/dL (ref 7–25)
CO2: 27 mmol/L (ref 20–31)
Calcium: 9.3 mg/dL (ref 8.6–10.3)
Chloride: 104 mmol/L (ref 98–110)
Creat: 1.08 mg/dL (ref 0.70–1.11)
Glucose, Bld: 115 mg/dL — ABNORMAL HIGH (ref 65–99)
Potassium: 4.2 mmol/L (ref 3.5–5.3)
Sodium: 140 mmol/L (ref 135–146)

## 2015-03-19 LAB — GLUCOSE, CAPILLARY: Glucose-Capillary: 155 mg/dL — ABNORMAL HIGH (ref 65–99)

## 2015-03-21 ENCOUNTER — Encounter (HOSPITAL_COMMUNITY)
Admission: RE | Admit: 2015-03-21 | Discharge: 2015-03-21 | Disposition: A | Discharge status: Home / Self Care | Payer: Medicare Other | Source: Ambulatory Visit | Attending: Cardiology | Admitting: Cardiology

## 2015-03-21 ENCOUNTER — Encounter (HOSPITAL_COMMUNITY): Payer: Medicare Other

## 2015-03-21 DIAGNOSIS — Z955 Presence of coronary angioplasty implant and graft: Secondary | ICD-10-CM | POA: Diagnosis not present

## 2015-03-21 LAB — GLUCOSE, CAPILLARY
Glucose-Capillary: 139 mg/dL — ABNORMAL HIGH (ref 65–99)
Glucose-Capillary: 129 mg/dL — ABNORMAL HIGH (ref 65–99)

## 2015-03-24 ENCOUNTER — Encounter (HOSPITAL_COMMUNITY): Payer: Medicare Other

## 2015-03-24 ENCOUNTER — Encounter (HOSPITAL_COMMUNITY)
Admission: RE | Admit: 2015-03-24 | Discharge: 2015-03-24 | Disposition: A | Discharge status: Home / Self Care | Payer: Medicare Other | Source: Ambulatory Visit | Attending: Cardiology | Admitting: Cardiology

## 2015-03-24 DIAGNOSIS — Z955 Presence of coronary angioplasty implant and graft: Secondary | ICD-10-CM | POA: Diagnosis not present

## 2015-03-24 LAB — GLUCOSE, CAPILLARY: Glucose-Capillary: 162 mg/dL — ABNORMAL HIGH (ref 65–99)

## 2015-03-24 NOTE — Progress Notes (Signed)
[name] 82 y.o. male Nutrition Note Spoke with pt. Nutrition Plan and Nutrition Survey goals reviewed with pt. Pt is following Step 1 of the Therapeutic Lifestyle Changes diet. Age-appropriate nutrition discussed. Pt is diabetic. No recent A1c noted. Pt states his PCP is "Ok with my A1c being around 8." This Probation officer went over Diabetes Education test results. Pt reports he treats hypoglycemia with 2 small chocolate pieces. Recommended treatment for hypoglycemia reviewed. Pt checks CBG's once daily. Fasting CBG's reportedly 90-120 mg/dL. Pt is on Coumadin and is aware of the need to follow a diet with consistent intake of vitamin K. Pt expressed fair understanding of the information reviewed. Pt aware of nutrition education classes offered. Pt does not plan on attending classes offered and declined nutrition class handouts at this time.  Nutrition Diagnosis ? Food-and nutrition-related knowledge deficit related to lack of exposure to information as related to diagnosis of: ? CVD ? DM ? Obesity related to excessive energy intake as evidenced by a BMI of 37.0  Nutrition RX/ Estimated Daily Nutrition Needs for: wt loss 1300-1800 Kcal, 35-50 gm fat, 8-12 gm sat fat, 1.3-1.8 gm trans-fat, <1500 mg sodium, 175-250 gm CHO   Nutrition Intervention ? Pt's individual nutrition plan reviewed with pt. ? Benefits of adopting Therapeutic Lifestyle Changes discussed when Medficts reviewed. ? Pt to attend the Portion Distortion class ? Pt to attend the Diabetes Q & A class ? Continue client-centered nutrition education by RD, as part of interdisciplinary care. Goal(s) ? Pt to identify food quantities necessary to achieve: ? wt loss to a goal wt loss of 6-24 lb (2.7-10.9 kg) at graduation from cardiac rehab.  ? Pt to describe the benefit of including fruits, vegetables, whole grains, and low-fat dairy products in a heart healthy meal plan. ? Use pre-meal and post-meal CBG's and A1c to determine whether  adjustments in food/meal planning will be beneficial or if any meds need to be combined with nutrition therapy. Monitor and Evaluate progress toward nutrition goal with team. Nutrition Risk: Change to Moderate Derek Mound, M.Ed, RD, LDN, CDE 03/24/2015 10:45 AM

## 2015-03-25 ENCOUNTER — Telehealth (HOSPITAL_COMMUNITY): Payer: Self-pay | Admitting: *Deleted

## 2015-03-25 ENCOUNTER — Ambulatory Visit (INDEPENDENT_AMBULATORY_CARE_PROVIDER_SITE_OTHER): Payer: Medicare Other | Admitting: Pharmacist Clinician (PhC)/ Clinical Pharmacy Specialist

## 2015-03-25 ENCOUNTER — Encounter: Payer: Medicare Other | Admitting: Pharmacist Clinician (PhC)/ Clinical Pharmacy Specialist

## 2015-03-25 DIAGNOSIS — Z7901 Long term (current) use of anticoagulants: Secondary | ICD-10-CM | POA: Diagnosis not present

## 2015-03-25 DIAGNOSIS — E1159 Type 2 diabetes mellitus with other circulatory complications: Secondary | ICD-10-CM | POA: Diagnosis not present

## 2015-03-25 DIAGNOSIS — E782 Mixed hyperlipidemia: Secondary | ICD-10-CM | POA: Diagnosis not present

## 2015-03-25 DIAGNOSIS — W19XXXA Unspecified fall, initial encounter: Secondary | ICD-10-CM | POA: Diagnosis not present

## 2015-03-25 DIAGNOSIS — I10 Essential (primary) hypertension: Secondary | ICD-10-CM | POA: Diagnosis not present

## 2015-03-25 DIAGNOSIS — I2510 Atherosclerotic heart disease of native coronary artery without angina pectoris: Secondary | ICD-10-CM

## 2015-03-25 DIAGNOSIS — E1169 Type 2 diabetes mellitus with other specified complication: Secondary | ICD-10-CM | POA: Diagnosis not present

## 2015-03-25 DIAGNOSIS — I4891 Unspecified atrial fibrillation: Secondary | ICD-10-CM

## 2015-03-25 DIAGNOSIS — S4991XA Unspecified injury of right shoulder and upper arm, initial encounter: Secondary | ICD-10-CM | POA: Diagnosis not present

## 2015-03-25 DIAGNOSIS — Z794 Long term (current) use of insulin: Secondary | ICD-10-CM | POA: Diagnosis not present

## 2015-03-25 DIAGNOSIS — Z5181 Encounter for therapeutic drug level monitoring: Secondary | ICD-10-CM | POA: Diagnosis not present

## 2015-03-25 DIAGNOSIS — I482 Chronic atrial fibrillation: Secondary | ICD-10-CM | POA: Diagnosis not present

## 2015-03-25 LAB — POCT INR: INR: 2.3

## 2015-03-26 ENCOUNTER — Encounter (HOSPITAL_COMMUNITY): Payer: Medicare Other

## 2015-03-26 ENCOUNTER — Encounter (HOSPITAL_COMMUNITY): Admission: RE | Admit: 2015-03-26 | Payer: Medicare Other | Source: Ambulatory Visit

## 2015-03-27 ENCOUNTER — Emergency Department (HOSPITAL_COMMUNITY)
Admission: EM | Admit: 2015-03-27 | Discharge: 2015-03-27 | Disposition: A | Discharge status: Home / Self Care | Payer: Medicare Other | Attending: Emergency Medicine | Admitting: Emergency Medicine

## 2015-03-27 ENCOUNTER — Encounter (HOSPITAL_COMMUNITY): Payer: Self-pay

## 2015-03-27 ENCOUNTER — Telehealth: Payer: Self-pay | Admitting: Cardiology

## 2015-03-27 DIAGNOSIS — Z7982 Long term (current) use of aspirin: Secondary | ICD-10-CM | POA: Diagnosis not present

## 2015-03-27 DIAGNOSIS — M1990 Unspecified osteoarthritis, unspecified site: Secondary | ICD-10-CM | POA: Diagnosis not present

## 2015-03-27 DIAGNOSIS — Z9861 Coronary angioplasty status: Secondary | ICD-10-CM | POA: Diagnosis not present

## 2015-03-27 DIAGNOSIS — Z87448 Personal history of other diseases of urinary system: Secondary | ICD-10-CM | POA: Diagnosis not present

## 2015-03-27 DIAGNOSIS — E785 Hyperlipidemia, unspecified: Secondary | ICD-10-CM | POA: Insufficient documentation

## 2015-03-27 DIAGNOSIS — R030 Elevated blood-pressure reading, without diagnosis of hypertension: Secondary | ICD-10-CM | POA: Diagnosis not present

## 2015-03-27 DIAGNOSIS — Z7901 Long term (current) use of anticoagulants: Secondary | ICD-10-CM | POA: Diagnosis not present

## 2015-03-27 DIAGNOSIS — Z8781 Personal history of (healed) traumatic fracture: Secondary | ICD-10-CM | POA: Diagnosis not present

## 2015-03-27 DIAGNOSIS — Z87440 Personal history of urinary (tract) infections: Secondary | ICD-10-CM | POA: Diagnosis not present

## 2015-03-27 DIAGNOSIS — Z79899 Other long term (current) drug therapy: Secondary | ICD-10-CM | POA: Insufficient documentation

## 2015-03-27 DIAGNOSIS — Z951 Presence of aortocoronary bypass graft: Secondary | ICD-10-CM | POA: Diagnosis not present

## 2015-03-27 DIAGNOSIS — R040 Epistaxis: Secondary | ICD-10-CM | POA: Insufficient documentation

## 2015-03-27 DIAGNOSIS — I4891 Unspecified atrial fibrillation: Secondary | ICD-10-CM | POA: Insufficient documentation

## 2015-03-27 DIAGNOSIS — Z87442 Personal history of urinary calculi: Secondary | ICD-10-CM | POA: Diagnosis not present

## 2015-03-27 DIAGNOSIS — E119 Type 2 diabetes mellitus without complications: Secondary | ICD-10-CM | POA: Diagnosis not present

## 2015-03-27 DIAGNOSIS — Z7902 Long term (current) use of antithrombotics/antiplatelets: Secondary | ICD-10-CM | POA: Insufficient documentation

## 2015-03-27 DIAGNOSIS — Z794 Long term (current) use of insulin: Secondary | ICD-10-CM | POA: Diagnosis not present

## 2015-03-27 DIAGNOSIS — Z87891 Personal history of nicotine dependence: Secondary | ICD-10-CM | POA: Insufficient documentation

## 2015-03-27 DIAGNOSIS — I2510 Atherosclerotic heart disease of native coronary artery without angina pectoris: Secondary | ICD-10-CM | POA: Diagnosis not present

## 2015-03-27 DIAGNOSIS — I10 Essential (primary) hypertension: Secondary | ICD-10-CM | POA: Insufficient documentation

## 2015-03-27 DIAGNOSIS — Z9889 Other specified postprocedural states: Secondary | ICD-10-CM | POA: Insufficient documentation

## 2015-03-27 DIAGNOSIS — K219 Gastro-esophageal reflux disease without esophagitis: Secondary | ICD-10-CM | POA: Insufficient documentation

## 2015-03-27 DIAGNOSIS — Z880 Allergy status to penicillin: Secondary | ICD-10-CM | POA: Insufficient documentation

## 2015-03-27 DIAGNOSIS — N400 Enlarged prostate without lower urinary tract symptoms: Secondary | ICD-10-CM | POA: Diagnosis not present

## 2015-03-27 DIAGNOSIS — Z7984 Long term (current) use of oral hypoglycemic drugs: Secondary | ICD-10-CM | POA: Diagnosis not present

## 2015-03-27 LAB — CBC
HCT: 35.7 % — ABNORMAL LOW (ref 39.0–52.0)
Hemoglobin: 12 g/dL — ABNORMAL LOW (ref 13.0–17.0)
MCH: 28.2 pg (ref 26.0–34.0)
MCHC: 33.6 g/dL (ref 30.0–36.0)
MCV: 83.8 fL (ref 78.0–100.0)
Platelets: 201 10*3/uL (ref 150–400)
RBC: 4.26 MIL/uL (ref 4.22–5.81)
RDW: 13.3 % (ref 11.5–15.5)
WBC: 11.3 10*3/uL — ABNORMAL HIGH (ref 4.0–10.5)

## 2015-03-27 LAB — PROTIME-INR
INR: 2.46 — ABNORMAL HIGH (ref 0.00–1.49)
Prothrombin Time: 26.4 seconds — ABNORMAL HIGH (ref 11.6–15.2)

## 2015-03-27 MED ORDER — OXYMETAZOLINE HCL 0.05 % NA SOLN
1.0000 | Freq: Once | NASAL | Status: AC
  Administered 2015-03-27: 1 via NASAL
  Filled 2015-03-27: qty 15

## 2015-03-27 NOTE — Telephone Encounter (Signed)
Returned call to patient.He stated he had to go to ER with nosebleed this morning.Stated ER Dr.wanted him to speak to [place] about being on 2 blood thinners.Stated 3 weeks ago he had to go to ER with groin bleed.Message sent to [place].

## 2015-03-27 NOTE — Telephone Encounter (Signed)
New message     Pt is on plavix and warfarin.  He went to the ER today because of a bad nose bleed.  Upon discharge, he was told to call his doctor.  Please call

## 2015-03-27 NOTE — ED Provider Notes (Signed)
CSN: [id number]     Arrival date & time 03/27/15  [id number] History   First MD Initiated Contact with Patient 03/27/15 [phone]     Chief Complaint  Patient presents with  . Epistaxis     (Consider location/radiation/quality/duration/timing/severity/associated sxs/prior Treatment) HPI Patient is on Coumadin for atrial fibrillation. Woke around 5 AM and blew nose. Started having right nare epistaxis. States that the bleeding continues at until shortly before being seen by this provider. Denied any chest pain, lightheadedness, shortness of breath, weakness or any other bleeding. Recently had INR evaluated and it was 2.3.  Past Medical History  Diagnosis Date  . Mild aortic stenosis   . HTN (hypertension)   . Hyperlipemia   . Bladder outlet obstruction   . BPH (benign prostatic hyperplasia)   . Kidney stones   . Abdominal pain   . Constipation   . Bladder outlet obstruction   . Acute renal failure ([place])      resolved  . Urinary tract infection      Enterococcus  . Rib fractures     left rib fractures being treated with pain medications  . CAD (coronary artery disease)     a. CABG IN 1989. b. 01/08/2015 CTO of ost LAD, LIMA to LAD not visualized but assumed patent given myoview finding, occluded SVG to diagonal, 99% mid RCA tx w/ SYNERGY DES 3X28 mm  . Diabetes mellitus type 2, insulin dependent ([place])   . Atrial fibrillation ([place])   . GERD (gastroesophageal reflux disease)   . Arthritis     "hands & legs" (11/'10/2014)   Past Surgical History  Procedure Laterality Date  . Coronary artery bypass graft  1989    "CABG X 2"  . Total hip arthroplasty Right 2000  . Cardiac catheterization  1989  . Carpal tunnel release Right 08/2010  . Cataract extraction w/ intraocular lens  implant, bilateral Bilateral   . Joint replacement    . Cardiac catheterization N/A 01/08/2015    Procedure: Left Heart Cath and Cors/Grafts Angiography;  Surgeon: Peter M Martinique, MD;  Location: [facility];   Service: Cardiovascular;  Laterality: N/A;  . Cardiac catheterization  01/08/2015    Procedure: Coronary Stent Intervention;  Surgeon: Peter M Martinique, MD;  Location: [place] [facility];  Service: Cardiovascular;;  . Tonsillectomy    . Coronary angioplasty     Family History  Problem Relation Age of Onset  . Brain cancer Father   . Throat cancer Brother    Social History  Substance Use Topics  . Smoking status: Former Smoker -- 3.00 packs/day for 10 years    Types: Cigarettes    Quit date: 05/11/1958  . Smokeless tobacco: Never Used  . Alcohol Use: No    Review of Systems  Constitutional: Negative for fever and chills.  HENT: Positive for nosebleeds.   Respiratory: Negative for cough and shortness of breath.   Cardiovascular: Negative for chest pain.  Gastrointestinal: Negative for nausea, vomiting and abdominal pain.  Skin: Negative for rash and wound.  Neurological: Negative for dizziness, weakness, light-headedness, numbness and headaches.  All other systems reviewed and are negative.     Allergies  Amoxicillin; Cefadroxil; Ciprofloxacin; and Erythromycin  Home Medications   Prior to Admission medications   Medication Sig Start Date End Date Taking? Authorizing Provider  AVODART 0.5 MG capsule Take 0.5 mg by mouth every other day.  07/20/11  Yes Historical Provider, MD  diltiazem (CARDIZEM CD) 360 MG 24 hr capsule  Take 1 capsule (360 mg total) by mouth daily. 01/10/15  Yes Peter M Martinique, MD  glucose blood (PRECISION XTRA TEST STRIPS) test strip 1 each by Other route See admin instructions. Check blood sugar once daily 11/08/13  Yes Historical Provider, MD  HYDROcodone-acetaminophen (NORCO/VICODIN) 5-325 MG tablet Take 1 tablet by mouth every 6 (six) hours as needed. pain 03/25/15 04/04/15 Yes Historical Provider, MD  insulin glargine (LANTUS) 100 UNIT/ML injection Inject 32 Units into the skin daily.    Yes Historical Provider, MD  lisinopril (PRINIVIL,ZESTRIL) 5 MG tablet  Take 1 tablet (5 mg total) by mouth daily. 01/28/15  Yes [name] G Barrett, [name]  metFORMIN (GLUCOPHAGE) 500 MG tablet Take 1,000 mg by mouth 2 (two) times daily with a meal.    Yes Historical Provider, MD  metoprolol succinate (TOPROL-XL) 100 MG 24 hr tablet Take 100 mg by mouth daily.  11/22/13  Yes Historical Provider, MD  nitroGLYCERIN (NITROSTAT) 0.4 MG SL tablet Place 1 tablet (0.4 mg total) under the tongue every 5 (five) minutes as needed. 01/09/15  Yes Almyra Deforest, PA  pantoprazole (PROTONIX) 40 MG tablet Take 1 tablet (40 mg total) by mouth daily at 12 noon. 01/09/15  Yes Almyra Deforest, PA  simvastatin (ZOCOR) 80 MG tablet Take 80 mg by mouth at bedtime.   Yes Historical Provider, MD  Tamsulosin HCl (FLOMAX) 0.4 MG CAPS Take 0.4 mg by mouth every other day.    Yes Historical Provider, MD  warfarin (COUMADIN) 5 MG tablet 1 tablet daily except 1.5 tablets on Wednesdays or as directed by coumadin clinic Patient taking differently: Take 5-7.5 mg by mouth daily. Take 5mg  by mouth daily except take 7.5 mg by mouth on Sunday and Wednesday. 08/16/14  Yes Peter M Martinique, MD  aspirin 81 MG chewable tablet Chew 1 tablet (81 mg total) by mouth daily. Patient not taking: Reported on 03/27/2015 01/09/15   Almyra Deforest, PA  clopidogrel (PLAVIX) 75 MG tablet Take 1 tablet (75 mg total) by mouth daily with breakfast. Patient not taking: Reported on 03/27/2015 01/09/15   Almyra Deforest, PA   BP 143/85 mmHg  Pulse 89  Temp(Src) 96.9 F (36.1 C) (Temporal)  Resp 18  SpO2 98% Physical Exam  Constitutional: He is oriented to person, place, and time. He appears well-developed and well-nourished. No distress.  HENT:  Head: Normocephalic and atraumatic.  Mouth/Throat: Oropharynx is clear and moist.  Dry blood in the right nare. Appears to be coming from the septum. There is no active bleeding. No posterior oropharyngeal blood.  Eyes: EOM are normal. Pupils are equal, round, and reactive to light.  Neck: Normal range of motion.  Neck supple.  Cardiovascular: Normal rate and regular rhythm.   Pulmonary/Chest: Effort normal and breath sounds normal. No respiratory distress. He has no wheezes. He has no rales. He exhibits no tenderness.  Abdominal: Soft. Bowel sounds are normal. He exhibits no distension and no mass. There is no tenderness. There is no rebound and no guarding.  Musculoskeletal: Normal range of motion. He exhibits no edema or tenderness.  Neurological: He is alert and oriented to person, place, and time.  Moving extremities without deficit. Sensation is fully intact.  Skin: Skin is warm and dry. No rash noted. No erythema.  Psychiatric: He has a normal mood and affect. His behavior is normal.  Nursing note and vitals reviewed.   ED Course  Procedures (including critical care time) Labs Review Labs Reviewed  PROTIME-INR - Abnormal; Notable for the following:  Prothrombin Time 26.4 (*)    INR 2.46 (*)    All other components within normal limits  CBC - Abnormal; Notable for the following:    WBC 11.3 (*)    Hemoglobin 12.0 (*)    HCT 35.7 (*)    All other components within normal limits    Imaging Review No results found. I have personally reviewed and evaluated these images and lab results as part of my medical decision-making.   EKG Interpretation None      MDM   Final diagnoses:  Epistaxis   Observed 3 hours in the emergency department with no further bleeding. Hemoglobin stable. INR is therapeutic. Discussed with patient the need to follow-up with cardiologist and primary physician discussed the need for Plavix and Coumadin given the recurrent episodes of bleeding. Return precautions given.     Julianne Rice, MD 03/27/15 1010

## 2015-03-27 NOTE — Discharge Instructions (Signed)

## 2015-03-27 NOTE — ED Notes (Signed)
Per [facility], pt from home for nosebleed since 0545 and is on coumadin. Mainly bleeding from right nare. Holding pressure. Denies any pain. Pt has hx of afib and is rate of same.

## 2015-03-27 NOTE — Telephone Encounter (Signed)
With recent stent in November he cannot stop Plavix. Continue coumadin with target INR 2-2.5. He has been in this range. Need to take precautions to avoid nosebleed- humidifier, avoid blowing nose vigorously, no picking nose. If bleeding persists we could hold coumadin for a short time but would prefer he continue.  Peter Martinique MD, [facility]

## 2015-03-27 NOTE — ED Notes (Signed)
Bleeding controled patient states no bleeding has occurred since Afrin given.

## 2015-03-27 NOTE — Telephone Encounter (Signed)
Returned call to patient.Dr.Jordan's recommendations given.Advised to call back if needed. 

## 2015-03-28 ENCOUNTER — Encounter (HOSPITAL_COMMUNITY): Payer: Self-pay

## 2015-03-28 ENCOUNTER — Telehealth: Payer: Self-pay | Admitting: Cardiology

## 2015-03-28 ENCOUNTER — Emergency Department (HOSPITAL_COMMUNITY)
Admission: EM | Admit: 2015-03-28 | Discharge: 2015-03-28 | Disposition: A | Discharge status: Home / Self Care | Payer: Medicare Other | Attending: Emergency Medicine | Admitting: Emergency Medicine

## 2015-03-28 ENCOUNTER — Encounter (HOSPITAL_COMMUNITY): Payer: Medicare Other

## 2015-03-28 DIAGNOSIS — E119 Type 2 diabetes mellitus without complications: Secondary | ICD-10-CM | POA: Diagnosis not present

## 2015-03-28 DIAGNOSIS — N400 Enlarged prostate without lower urinary tract symptoms: Secondary | ICD-10-CM | POA: Insufficient documentation

## 2015-03-28 DIAGNOSIS — Z7982 Long term (current) use of aspirin: Secondary | ICD-10-CM | POA: Insufficient documentation

## 2015-03-28 DIAGNOSIS — Z87440 Personal history of urinary (tract) infections: Secondary | ICD-10-CM | POA: Diagnosis not present

## 2015-03-28 DIAGNOSIS — Z951 Presence of aortocoronary bypass graft: Secondary | ICD-10-CM | POA: Insufficient documentation

## 2015-03-28 DIAGNOSIS — Z9861 Coronary angioplasty status: Secondary | ICD-10-CM | POA: Diagnosis not present

## 2015-03-28 DIAGNOSIS — Z880 Allergy status to penicillin: Secondary | ICD-10-CM | POA: Insufficient documentation

## 2015-03-28 DIAGNOSIS — K219 Gastro-esophageal reflux disease without esophagitis: Secondary | ICD-10-CM | POA: Insufficient documentation

## 2015-03-28 DIAGNOSIS — K5900 Constipation, unspecified: Secondary | ICD-10-CM | POA: Insufficient documentation

## 2015-03-28 DIAGNOSIS — Z87891 Personal history of nicotine dependence: Secondary | ICD-10-CM | POA: Diagnosis not present

## 2015-03-28 DIAGNOSIS — Z8781 Personal history of (healed) traumatic fracture: Secondary | ICD-10-CM | POA: Insufficient documentation

## 2015-03-28 DIAGNOSIS — Z794 Long term (current) use of insulin: Secondary | ICD-10-CM | POA: Diagnosis not present

## 2015-03-28 DIAGNOSIS — M1990 Unspecified osteoarthritis, unspecified site: Secondary | ICD-10-CM | POA: Insufficient documentation

## 2015-03-28 DIAGNOSIS — I4891 Unspecified atrial fibrillation: Secondary | ICD-10-CM | POA: Insufficient documentation

## 2015-03-28 DIAGNOSIS — Z7902 Long term (current) use of antithrombotics/antiplatelets: Secondary | ICD-10-CM | POA: Insufficient documentation

## 2015-03-28 DIAGNOSIS — Z87442 Personal history of urinary calculi: Secondary | ICD-10-CM | POA: Diagnosis not present

## 2015-03-28 DIAGNOSIS — E785 Hyperlipidemia, unspecified: Secondary | ICD-10-CM | POA: Diagnosis not present

## 2015-03-28 DIAGNOSIS — Z79899 Other long term (current) drug therapy: Secondary | ICD-10-CM | POA: Insufficient documentation

## 2015-03-28 DIAGNOSIS — R040 Epistaxis: Secondary | ICD-10-CM | POA: Insufficient documentation

## 2015-03-28 DIAGNOSIS — R58 Hemorrhage, not elsewhere classified: Secondary | ICD-10-CM | POA: Diagnosis not present

## 2015-03-28 DIAGNOSIS — Z7901 Long term (current) use of anticoagulants: Secondary | ICD-10-CM | POA: Insufficient documentation

## 2015-03-28 DIAGNOSIS — I10 Essential (primary) hypertension: Secondary | ICD-10-CM | POA: Diagnosis not present

## 2015-03-28 DIAGNOSIS — Z9889 Other specified postprocedural states: Secondary | ICD-10-CM | POA: Insufficient documentation

## 2015-03-28 DIAGNOSIS — I2510 Atherosclerotic heart disease of native coronary artery without angina pectoris: Secondary | ICD-10-CM | POA: Diagnosis not present

## 2015-03-28 LAB — CBC WITH DIFFERENTIAL/PLATELET
Basophils Absolute: 0 10*3/uL (ref 0.0–0.1)
Basophils Relative: 0 %
Eosinophils Absolute: 0.1 10*3/uL (ref 0.0–0.7)
Eosinophils Relative: 1 %
HCT: 33.3 % — ABNORMAL LOW (ref 39.0–52.0)
Hemoglobin: 11.2 g/dL — ABNORMAL LOW (ref 13.0–17.0)
Lymphocytes Relative: 13 %
Lymphs Abs: 1.4 10*3/uL (ref 0.7–4.0)
MCH: 28.1 pg (ref 26.0–34.0)
MCHC: 33.6 g/dL (ref 30.0–36.0)
MCV: 83.5 fL (ref 78.0–100.0)
Monocytes Absolute: 1.1 10*3/uL — ABNORMAL HIGH (ref 0.1–1.0)
Monocytes Relative: 11 %
Neutro Abs: 8 10*3/uL — ABNORMAL HIGH (ref 1.7–7.7)
Neutrophils Relative %: 75 %
Platelets: 206 10*3/uL (ref 150–400)
RBC: 3.99 MIL/uL — ABNORMAL LOW (ref 4.22–5.81)
RDW: 13 % (ref 11.5–15.5)
WBC: 10.7 10*3/uL — ABNORMAL HIGH (ref 4.0–10.5)

## 2015-03-28 LAB — PROTIME-INR
INR: 2.08 — ABNORMAL HIGH (ref 0.00–1.49)
Prothrombin Time: 23.2 seconds — ABNORMAL HIGH (ref 11.6–15.2)

## 2015-03-28 MED ORDER — LIDOCAINE HCL (PF) 1 % IJ SOLN
5.0000 mL | Freq: Once | INTRAMUSCULAR | Status: AC
  Administered 2015-03-28: 5 mL
  Filled 2015-03-28: qty 5

## 2015-03-28 MED ORDER — OXYMETAZOLINE HCL 0.05 % NA SOLN
1.0000 | Freq: Once | NASAL | Status: AC
  Administered 2015-03-28: 1 via NASAL
  Filled 2015-03-28: qty 15

## 2015-03-28 NOTE — ED Provider Notes (Signed)
CSN: [id number]     Arrival date & time 03/28/15  0147 History   By signing my name below, I, [name], attest that this documentation has been prepared under the direction and in the presence of Ripley Fraise, MD.  Electronically Signed: Forrestine [name], ED Scribe. 03/28/2015. 3:09 AM.   Chief Complaint  Patient presents with  . Epistaxis   Patient is a 82 y.o. male presenting with nosebleeds. The history is provided by the patient. No language interpreter was used.  Epistaxis Location:  R nare Timing:  Intermittent Progression:  Unchanged Chronicity:  Recurrent Context: anticoagulants   Relieved by:  Nothing Worsened by:  Nothing tried Ineffective treatments:  None tried Associated symptoms: no congestion, no cough, no dizziness, no fever and no headaches     HPI Comments: [name] brought in by EMS is a 82 y.o. male with a PMHx of aortic stenosis, HTN, hyperlipidemia, CAD, and A-Fib who presents to the Emergency Department here for intermittent, ongoing R sided epistaxis this evening. Pt states bleeding began at approximately 0030 this morning. Pressure to nose attempted at home without any improvement. No recent fever, chills, nausea, vomiting, abdominal pain, cough, or congestion. Pt was evaluated yesterday for same but was safely discharged home after Afrin treatment. [name] takes Coumadin daily.  PCP: [name], MD    Past Medical History  Diagnosis Date  . Mild aortic stenosis   . HTN (hypertension)   . Hyperlipemia   . Bladder outlet obstruction   . BPH (benign prostatic hyperplasia)   . Kidney stones   . Abdominal pain   . Constipation   . Bladder outlet obstruction   . Acute renal failure ([place])      resolved  . Urinary tract infection      Enterococcus  . Rib fractures     left rib fractures being treated with pain medications  . CAD (coronary artery disease)     a. CABG IN 1989. b. 01/08/2015 CTO of ost LAD, LIMA to LAD not visualized but  assumed patent given myoview finding, occluded SVG to diagonal, 99% mid RCA tx w/ SYNERGY DES 3X28 mm  . Diabetes mellitus type 2, insulin dependent ([place])   . Atrial fibrillation ([place])   . GERD (gastroesophageal reflux disease)   . Arthritis     "hands & legs" (11/'10/2014)   Past Surgical History  Procedure Laterality Date  . Coronary artery bypass graft  1989    "CABG X 2"  . Total hip arthroplasty Right 2000  . Cardiac catheterization  1989  . Carpal tunnel release Right 08/2010  . Cataract extraction w/ intraocular lens  implant, bilateral Bilateral   . Joint replacement    . Cardiac catheterization N/A 01/08/2015    Procedure: Left Heart Cath and Cors/Grafts Angiography;  Surgeon: Peter M Martinique, MD;  Location: [facility];  Service: Cardiovascular;  Laterality: N/A;  . Cardiac catheterization  01/08/2015    Procedure: Coronary Stent Intervention;  Surgeon: Peter M Martinique, MD;  Location: [place] [facility];  Service: Cardiovascular;;  . Tonsillectomy    . Coronary angioplasty     Family History  Problem Relation Age of Onset  . Brain cancer Father   . Throat cancer Brother    Social History  Substance Use Topics  . Smoking status: Former Smoker -- 3.00 packs/day for 10 years    Types: Cigarettes    Quit date: 05/11/1958  . Smokeless tobacco: Never Used  . Alcohol Use:  No    Review of Systems  Constitutional: Negative for fever and chills.  HENT: Positive for nosebleeds. Negative for congestion.   Respiratory: Negative for cough.   Cardiovascular: Negative for chest pain.  Gastrointestinal: Negative for nausea, vomiting and abdominal pain.  Neurological: Negative for dizziness and headaches.  Psychiatric/Behavioral: Negative for confusion.      Allergies  Amoxicillin; Cefadroxil; Ciprofloxacin; and Erythromycin  Home Medications   Prior to Admission medications   Medication Sig Start Date End Date Taking? Authorizing Provider  aspirin 81 MG chewable  tablet Chew 1 tablet (81 mg total) by mouth daily. Patient not taking: Reported on 03/27/2015 01/09/15   Almyra Deforest, PA  AVODART 0.5 MG capsule Take 0.5 mg by mouth every other day.  07/20/11   Historical Provider, MD  clopidogrel (PLAVIX) 75 MG tablet Take 1 tablet (75 mg total) by mouth daily with breakfast. 01/09/15   Almyra Deforest, PA  diltiazem (CARDIZEM CD) 360 MG 24 hr capsule Take 1 capsule (360 mg total) by mouth daily. 01/10/15   Peter M Martinique, MD  glucose blood (PRECISION XTRA TEST STRIPS) test strip 1 each by Other route See admin instructions. Check blood sugar once daily 11/08/13   Historical Provider, MD  HYDROcodone-acetaminophen (NORCO/VICODIN) 5-325 MG tablet Take 1 tablet by mouth every 6 (six) hours as needed. pain 03/25/15 04/04/15  Historical Provider, MD  insulin glargine (LANTUS) 100 UNIT/ML injection Inject 32 Units into the skin daily.     Historical Provider, MD  lisinopril (PRINIVIL,ZESTRIL) 5 MG tablet Take 1 tablet (5 mg total) by mouth daily. 01/28/15   [name] G Barrett, [name]  metFORMIN (GLUCOPHAGE) 500 MG tablet Take 1,000 mg by mouth 2 (two) times daily with a meal.     Historical Provider, MD  metoprolol succinate (TOPROL-XL) 100 MG 24 hr tablet Take 100 mg by mouth daily.  11/22/13   Historical Provider, MD  nitroGLYCERIN (NITROSTAT) 0.4 MG SL tablet Place 1 tablet (0.4 mg total) under the tongue every 5 (five) minutes as needed. 01/09/15   Almyra Deforest, PA  pantoprazole (PROTONIX) 40 MG tablet Take 1 tablet (40 mg total) by mouth daily at 12 noon. 01/09/15   Almyra Deforest, PA  simvastatin (ZOCOR) 80 MG tablet Take 80 mg by mouth at bedtime.    Historical Provider, MD  Tamsulosin HCl (FLOMAX) 0.4 MG CAPS Take 0.4 mg by mouth every other day.     Historical Provider, MD  warfarin (COUMADIN) 5 MG tablet 1 tablet daily except 1.5 tablets on Wednesdays or as directed by coumadin clinic Patient taking differently: Take 5-7.5 mg by mouth daily. Take 5mg  by mouth daily except take 7.5 mg by  mouth on Sunday and Wednesday. 08/16/14   Peter M Martinique, MD   Triage Vitals: BP 144/73 mmHg  Pulse 94  Resp 20  SpO2 96%   Physical Exam  CONSTITUTIONAL: Well developed/well nourished HEAD: Normocephalic/atraumatic EYES: EOMI/PERRL ENMT: Mucous membranes moist. Blood in R nare. No blood in L nare NECK: supple no meningeal signs LUNGS: Lungs are clear to auscultation bilaterally, no apparent distress NEURO: Pt is awake/alert/appropriate, moves all extremitiesx4.  No facial droop.   EXTREMITIES: pulses normal/equal, full ROM SKIN: warm, color normal PSYCH: no abnormalities of mood noted, alert and oriented to situation   ED Course  .Epistaxis Management Date/Time: 03/28/2015 3:41 AM Performed by: Ripley Fraise Authorized by: Ripley Fraise Consent: Verbal consent obtained. Consent given by: patient Patient identity confirmed: verbally with patient Treatment site: right anterior Repair method:  anterior pack Post-procedure assessment: bleeding stopped Treatment complexity: simple Patient tolerance: Patient tolerated the procedure well with no immediate complications       DIAGNOSTIC STUDIES: Oxygen Saturation is 97% on RA, adequate by my interpretation.    COORDINATION OF CARE: 3:03 AM- Will give Afrin. Will order CBC, EKG, and PT-INR. Discussed treatment plan with pt at bedside and pt agreed to plan.     Labs Review Labs Reviewed  CBC WITH DIFFERENTIAL/PLATELET - Abnormal; Notable for the following:    WBC 10.7 (*)    RBC 3.99 (*)    Hemoglobin 11.2 (*)    HCT 33.3 (*)    Neutro Abs 8.0 (*)    Monocytes Absolute 1.1 (*)    All other components within normal limits  PROTIME-INR - Abnormal; Notable for the following:    Prothrombin Time 23.2 (*)    INR 2.08 (*)    All other components within normal limits   I have personally reviewed and evaluated these lab results as part of my medical decision-making.   EKG Interpretation   Date/Time:  Friday March 28 2015 02:03:29 EST Ventricular Rate:  84 PR Interval:    QRS Duration: 93 QT Interval:  360 QTC Calculation: 425 R Axis:   49 Text Interpretation:  Atrial fibrillation Borderline repolarization  abnormality Baseline wander in lead(s) V4 No significant change since last  tracing Confirmed by Christy Gentles  MD, Elenore Rota ([id number]) on 03/28/2015 2:30:11 AM      MDM   Final diagnoses:  Anterior epistaxis    Nursing notes including past medical history and social history reviewed and considered in documentation Labs/vital reviewed myself and considered during evaluation   I personally performed the services described in this documentation, which was scribed in my presence. The recorded information has been reviewed and is accurate.       Ripley Fraise, MD 03/28/15 [phone]

## 2015-03-28 NOTE — Telephone Encounter (Signed)
I would have him hold his coumadin for 2 days so nosebleed can resolve then resume. He needs to take Plavix with recent stent.  Azim Gillingham Martinique MD, [facility]

## 2015-03-28 NOTE — Discharge Instructions (Signed)

## 2015-03-28 NOTE — ED Notes (Signed)
ENT cart and lidocaine placed at bedside for MD use.

## 2015-03-28 NOTE — ED Notes (Signed)
Patient verbalized understanding of discharge instructions and denies any further needs or questions at this time. VS stable. Patient ambulatory with steady gait. Assisted to ED entrance in wheelchair.   

## 2015-03-28 NOTE — ED Notes (Signed)
Pt from home by [facility] for a nosebleed that started about 0030 this morning. Pt was seen yesterday for a nose bleed and was given Afrin. Pt took the afrin and it helped the bleeding but then it started back. Pt is on warfrin.

## 2015-03-28 NOTE — Telephone Encounter (Signed)
Spoke with pt, aware of dr jordan's recommendations. 

## 2015-03-28 NOTE — Telephone Encounter (Signed)
Spoke with pt, he was told by the ER to contact his doctor. He is not sure why, he is taking plavix and warfarin. His INR in the ER was fine. Will make dr Martinique and kristin aware.

## 2015-03-28 NOTE — ED Notes (Signed)
Patient sprayed Afrin and nose began to bleed again. Pt given gauze to hold pressure. MD made aware and at bedside.

## 2015-03-28 NOTE — Telephone Encounter (Signed)
New problem   Pt is having nose bleeds and went to ER and had a balloon put in his nose. Pt wish to speak to nurse because he had another nosebleed last night. Pt isn't have a nose bleed at present. Pt request a call back asap to speak to nurse.

## 2015-03-29 ENCOUNTER — Encounter (HOSPITAL_COMMUNITY): Payer: Self-pay | Admitting: Emergency Medicine

## 2015-03-29 ENCOUNTER — Emergency Department (HOSPITAL_COMMUNITY)
Admission: EM | Admit: 2015-03-29 | Discharge: 2015-03-29 | Disposition: A | Discharge status: Home / Self Care | Payer: Medicare Other | Attending: Emergency Medicine | Admitting: Emergency Medicine

## 2015-03-29 DIAGNOSIS — E785 Hyperlipidemia, unspecified: Secondary | ICD-10-CM | POA: Diagnosis not present

## 2015-03-29 DIAGNOSIS — Z7984 Long term (current) use of oral hypoglycemic drugs: Secondary | ICD-10-CM | POA: Diagnosis not present

## 2015-03-29 DIAGNOSIS — E119 Type 2 diabetes mellitus without complications: Secondary | ICD-10-CM | POA: Insufficient documentation

## 2015-03-29 DIAGNOSIS — I10 Essential (primary) hypertension: Secondary | ICD-10-CM | POA: Insufficient documentation

## 2015-03-29 DIAGNOSIS — I2510 Atherosclerotic heart disease of native coronary artery without angina pectoris: Secondary | ICD-10-CM | POA: Insufficient documentation

## 2015-03-29 DIAGNOSIS — Z87440 Personal history of urinary (tract) infections: Secondary | ICD-10-CM | POA: Insufficient documentation

## 2015-03-29 DIAGNOSIS — I4891 Unspecified atrial fibrillation: Secondary | ICD-10-CM | POA: Insufficient documentation

## 2015-03-29 DIAGNOSIS — Z9889 Other specified postprocedural states: Secondary | ICD-10-CM | POA: Diagnosis not present

## 2015-03-29 DIAGNOSIS — K219 Gastro-esophageal reflux disease without esophagitis: Secondary | ICD-10-CM | POA: Insufficient documentation

## 2015-03-29 DIAGNOSIS — Z87442 Personal history of urinary calculi: Secondary | ICD-10-CM | POA: Diagnosis not present

## 2015-03-29 DIAGNOSIS — Z79899 Other long term (current) drug therapy: Secondary | ICD-10-CM | POA: Diagnosis not present

## 2015-03-29 DIAGNOSIS — F419 Anxiety disorder, unspecified: Secondary | ICD-10-CM | POA: Insufficient documentation

## 2015-03-29 DIAGNOSIS — M1990 Unspecified osteoarthritis, unspecified site: Secondary | ICD-10-CM | POA: Diagnosis not present

## 2015-03-29 DIAGNOSIS — Z794 Long term (current) use of insulin: Secondary | ICD-10-CM | POA: Insufficient documentation

## 2015-03-29 DIAGNOSIS — R58 Hemorrhage, not elsewhere classified: Secondary | ICD-10-CM | POA: Diagnosis not present

## 2015-03-29 DIAGNOSIS — Z955 Presence of coronary angioplasty implant and graft: Secondary | ICD-10-CM | POA: Insufficient documentation

## 2015-03-29 DIAGNOSIS — Z87891 Personal history of nicotine dependence: Secondary | ICD-10-CM | POA: Diagnosis not present

## 2015-03-29 DIAGNOSIS — Z7902 Long term (current) use of antithrombotics/antiplatelets: Secondary | ICD-10-CM | POA: Diagnosis not present

## 2015-03-29 DIAGNOSIS — Z87448 Personal history of other diseases of urinary system: Secondary | ICD-10-CM | POA: Insufficient documentation

## 2015-03-29 DIAGNOSIS — Z7901 Long term (current) use of anticoagulants: Secondary | ICD-10-CM | POA: Diagnosis not present

## 2015-03-29 DIAGNOSIS — Z8781 Personal history of (healed) traumatic fracture: Secondary | ICD-10-CM | POA: Diagnosis not present

## 2015-03-29 DIAGNOSIS — R040 Epistaxis: Secondary | ICD-10-CM | POA: Insufficient documentation

## 2015-03-29 DIAGNOSIS — N400 Enlarged prostate without lower urinary tract symptoms: Secondary | ICD-10-CM | POA: Diagnosis not present

## 2015-03-29 LAB — CBC WITH DIFFERENTIAL/PLATELET
Basophils Absolute: 0.1 10*3/uL (ref 0.0–0.1)
Basophils Relative: 1 %
Eosinophils Absolute: 0.2 10*3/uL (ref 0.0–0.7)
Eosinophils Relative: 2 %
HCT: 35.3 % — ABNORMAL LOW (ref 39.0–52.0)
Hemoglobin: 12.2 g/dL — ABNORMAL LOW (ref 13.0–17.0)
Lymphocytes Relative: 15 %
Lymphs Abs: 1.5 10*3/uL (ref 0.7–4.0)
MCH: 28.6 pg (ref 26.0–34.0)
MCHC: 34.6 g/dL (ref 30.0–36.0)
MCV: 82.7 fL (ref 78.0–100.0)
Monocytes Absolute: 0.7 10*3/uL (ref 0.1–1.0)
Monocytes Relative: 7 %
Neutro Abs: 7.5 10*3/uL (ref 1.7–7.7)
Neutrophils Relative %: 75 %
Platelets: 272 10*3/uL (ref 150–400)
RBC: 4.27 MIL/uL (ref 4.22–5.81)
RDW: 13.4 % (ref 11.5–15.5)
WBC: 9.9 10*3/uL (ref 4.0–10.5)

## 2015-03-29 LAB — BASIC METABOLIC PANEL
Anion gap: 11 (ref 5–15)
BUN: 15 mg/dL (ref 6–20)
CO2: 27 mmol/L (ref 22–32)
Calcium: 9.5 mg/dL (ref 8.9–10.3)
Chloride: 102 mmol/L (ref 101–111)
Creatinine, Ser: 1.09 mg/dL (ref 0.61–1.24)
GFR calc Af Amer: >60 mL/min (ref >60)
GFR calc non Af Amer: >60 mL/min (ref >60)
Glucose, Bld: 211 mg/dL — ABNORMAL HIGH (ref 65–99)
Potassium: 3.8 mmol/L (ref 3.5–5.1)
Sodium: 140 mmol/L (ref 135–145)

## 2015-03-29 LAB — PROTIME-INR
INR: 2.05 — ABNORMAL HIGH (ref 0.00–1.49)
Prothrombin Time: 23 seconds — ABNORMAL HIGH (ref 11.6–15.2)

## 2015-03-29 MED ORDER — OXYMETAZOLINE HCL 0.05 % NA SOLN: 1.0000 | Freq: Once | NASAL | Status: DC

## 2015-03-29 MED ORDER — SODIUM CHLORIDE 0.9 % IV SOLN: INTRAVENOUS | Status: DC

## 2015-03-29 MED ORDER — ONDANSETRON HCL 4 MG/2ML IJ SOLN
4.0000 mg | Freq: Once | INTRAMUSCULAR | Status: AC
  Administered 2015-03-29: 4 mg via INTRAVENOUS
  Filled 2015-03-29: qty 2

## 2015-03-29 MED ORDER — LORAZEPAM 2 MG/ML IJ SOLN
1.0000 mg | Freq: Once | INTRAMUSCULAR | Status: AC
  Administered 2015-03-29: 1 mg via INTRAVENOUS
  Filled 2015-03-29: qty 1

## 2015-03-29 NOTE — Procedures (Signed)
Preop diagnosis: Right epistaxis, anticoagulation Postop diagnosis: same Procedure: Control of right anterior epistaxis Surgeon: Redmond Baseman Anesth: Topical lidocaine Comp: None Findings: The bleeding site was easily identified on the right anterior septum where bleeding was brisk.  This was controlled with silver nitrate cautery. Description: After discussing risks, benefits, and alternatives, the patient was placed in a seated position.  The pack was removed from the right nasal passage and bleeding began.  The right nasal passage was suctioned and the bleeding site was identified.  The right nasal passage was packed with cotton coated in viscous lidocaine for a couple of minutes.  Packing was removed.  The bleeding site was then treated with silver nitrate with intermittent cotton packing and pressure.  Cotton was finally removed and there was no more bleeding.  He was returned to nursing care in stable condition.

## 2015-03-29 NOTE — ED Notes (Signed)
Pt cussed phlebotomist out when she entered room to draw blood. EMT will attempt to draw blood.

## 2015-03-29 NOTE — ED Notes (Signed)
Pt reports nose began bleeding again at 0800 while brushing teeth.  Pt has been here past 2 days for same.  Packing in R nare in place and saturated.  Blood noted to be oozing from R tear duct. Resp e/u, NAD noted.

## 2015-03-29 NOTE — ED Notes (Signed)
Pt ambulated to the bathroom with ease. No complaints

## 2015-03-29 NOTE — ED Notes (Addendum)
ENT MD at bedside.

## 2015-03-29 NOTE — ED Provider Notes (Signed)
CSN: [id number]     Arrival date & time 03/29/15  [id number] History   First MD Initiated Contact with Patient 03/29/15 0940     Chief Complaint  Patient presents with  . Epistaxis     (Consider location/radiation/quality/duration/timing/severity/associated sxs/prior Treatment) Patient is a 82 y.o. male presenting with nosebleeds. The history is provided by the patient, the spouse and a relative.  Epistaxis Associated symptoms: no congestion, no fever and no headaches    patient presents with recurrent bleeding from right nares. Patient currently has Rhino Rocket in place that was placed yesterday. This is patient's third visit for bleeding from the right nares. Patient is on Coumadin. Patient's cardiologist did have him hold the Coumadin as of yesterday. Patient very anxious concerned about bleeding. Rhino Rocket the is saturated and there is some bleeding around it and some bleeding from the left nares no large amounts of bleeding.  Past Medical History  Diagnosis Date  . Mild aortic stenosis   . HTN (hypertension)   . Hyperlipemia   . Bladder outlet obstruction   . BPH (benign prostatic hyperplasia)   . Kidney stones   . Abdominal pain   . Constipation   . Bladder outlet obstruction   . Acute renal failure ([place])      resolved  . Urinary tract infection      Enterococcus  . Rib fractures     left rib fractures being treated with pain medications  . CAD (coronary artery disease)     a. CABG IN 1989. b. 01/08/2015 CTO of ost LAD, LIMA to LAD not visualized but assumed patent given myoview finding, occluded SVG to diagonal, 99% mid RCA tx w/ SYNERGY DES 3X28 mm  . Diabetes mellitus type 2, insulin dependent ([place])   . Atrial fibrillation ([place])   . GERD (gastroesophageal reflux disease)   . Arthritis     "hands & legs" (11/'10/2014)   Past Surgical History  Procedure Laterality Date  . Coronary artery bypass graft  1989    "CABG X 2"  . Total hip arthroplasty Right 2000  . Cardiac  catheterization  1989  . Carpal tunnel release Right 08/2010  . Cataract extraction w/ intraocular lens  implant, bilateral Bilateral   . Joint replacement    . Cardiac catheterization N/A 01/08/2015    Procedure: Left Heart Cath and Cors/Grafts Angiography;  Surgeon: Peter M Martinique, MD;  Location: [facility];  Service: Cardiovascular;  Laterality: N/A;  . Cardiac catheterization  01/08/2015    Procedure: Coronary Stent Intervention;  Surgeon: Peter M Martinique, MD;  Location: [facility];  Service: Cardiovascular;;  . Tonsillectomy    . Coronary angioplasty     Family History  Problem Relation Age of Onset  . Brain cancer Father   . Throat cancer Brother    Social History  Substance Use Topics  . Smoking status: Former Smoker -- 3.00 packs/day for 10 years    Types: Cigarettes    Quit date: 05/11/1958  . Smokeless tobacco: Never Used  . Alcohol Use: No    Review of Systems  Constitutional: Negative for fever.  HENT: Positive for nosebleeds. Negative for congestion.   Eyes: Negative for visual disturbance.  Respiratory: Negative for shortness of breath.   Cardiovascular: Negative for chest pain.  Gastrointestinal: Negative for abdominal pain.  Genitourinary: Negative for dysuria.  Musculoskeletal: Negative for back pain.  Skin: Negative for rash.  Neurological: Negative for headaches.  Hematological: Bruises/bleeds easily.  Psychiatric/Behavioral: The  patient is nervous/anxious.       Allergies  Amoxicillin; Cefadroxil; Ciprofloxacin; and Erythromycin  Home Medications   Prior to Admission medications   Medication Sig Start Date End Date Taking? Authorizing Provider  AVODART 0.5 MG capsule Take 0.5 mg by mouth every other day.  07/20/11  Yes Historical Provider, MD  clopidogrel (PLAVIX) 75 MG tablet Take 1 tablet (75 mg total) by mouth daily with breakfast. 01/09/15  Yes Almyra Deforest, PA  diltiazem (CARDIZEM CD) 360 MG 24 hr capsule Take 1 capsule (360 mg total)  by mouth daily. 01/10/15  Yes Peter M Martinique, MD  insulin glargine (LANTUS) 100 UNIT/ML injection Inject 32 Units into the skin daily.    Yes Historical Provider, MD  lisinopril (PRINIVIL,ZESTRIL) 5 MG tablet Take 1 tablet (5 mg total) by mouth daily. 01/28/15  Yes [name] G Barrett, [name]  metFORMIN (GLUCOPHAGE) 500 MG tablet Take 1,000 mg by mouth 2 (two) times daily with a meal.    Yes Historical Provider, MD  metoprolol succinate (TOPROL-XL) 100 MG 24 hr tablet Take 100 mg by mouth daily.  11/22/13  Yes Historical Provider, MD  pantoprazole (PROTONIX) 40 MG tablet Take 1 tablet (40 mg total) by mouth daily at 12 noon. 01/09/15  Yes Almyra Deforest, PA  simvastatin (ZOCOR) 80 MG tablet Take 80 mg by mouth at bedtime.   Yes Historical Provider, MD  Tamsulosin HCl (FLOMAX) 0.4 MG CAPS Take 0.4 mg by mouth every other day.    Yes Historical Provider, MD  warfarin (COUMADIN) 5 MG tablet 1 tablet daily except 1.5 tablets on Wednesdays or as directed by coumadin clinic Patient taking differently: Take 5-7.5 mg by mouth daily. Take 5mg  by mouth daily except take 7.5 mg by mouth on Sunday and Wednesday. 08/16/14  Yes Peter M Martinique, MD  aspirin 81 MG chewable tablet Chew 1 tablet (81 mg total) by mouth daily. Patient not taking: Reported on 03/27/2015 01/09/15   Almyra Deforest, PA  glucose blood (PRECISION XTRA TEST STRIPS) test strip 1 each by Other route See admin instructions. Check blood sugar once daily 11/08/13   Historical Provider, MD  HYDROcodone-acetaminophen (NORCO/VICODIN) 5-325 MG tablet Take 1 tablet by mouth every 6 (six) hours as needed. pain 03/25/15 04/04/15  Historical Provider, MD  nitroGLYCERIN (NITROSTAT) 0.4 MG SL tablet Place 1 tablet (0.4 mg total) under the tongue every 5 (five) minutes as needed. 01/09/15   Almyra Deforest, PA   BP 161/73 mmHg  Pulse 73  Temp(Src) 97 F (36.1 C) (Axillary)  Resp 15  SpO2 98% Physical Exam  Constitutional: He is oriented to person, place, and time. He appears  well-developed and well-nourished. He appears distressed.  HENT:  Head: Normocephalic and atraumatic.  Patient is [employer] and right nares. Some saturation of an Rhino Rocket and some slight bleeding or around it. Also with some mild slight bleeding out the left nares. No active bleeding nares no active bleeding down the back of throat.  Neck: Normal range of motion. Neck supple.  Cardiovascular: Normal rate and normal heart sounds.   No murmur heard. Irregular  Pulmonary/Chest: Effort normal and breath sounds normal. No respiratory distress.  Abdominal: Soft. Bowel sounds are normal. There is no tenderness.  Musculoskeletal: Normal range of motion.  Neurological: He is alert and oriented to person, place, and time. No cranial nerve deficit. He exhibits normal muscle tone. Coordination normal.  Skin: Skin is warm.  Nursing note and vitals reviewed.   ED Course  Procedures (  including critical care time) Labs Review Labs Reviewed  PROTIME-INR - Abnormal; Notable for the following:    Prothrombin Time 23.0 (*)    INR 2.05 (*)    All other components within normal limits  BASIC METABOLIC PANEL - Abnormal; Notable for the following:    Glucose, Bld 211 (*)    All other components within normal limits  CBC WITH DIFFERENTIAL/PLATELET - Abnormal; Notable for the following:    Hemoglobin 12.2 (*)    HCT 35.3 (*)    All other components within normal limits   Results for orders placed or performed during the hospital encounter of 03/29/15  Protime-INR - (order if Patient is taking Coumadin / Warfarin)  Result Value Ref Range   Prothrombin Time 23.0 (H) 11.6 - 15.2 seconds   INR 2.05 (H) 0.00 - 99991111  Basic metabolic panel  Result Value Ref Range   Sodium 140 135 - 145 mmol/L   Potassium 3.8 3.5 - 5.1 mmol/L   Chloride 102 101 - 111 mmol/L   CO2 27 22 - 32 mmol/L   Glucose, Bld 211 (H) 65 - 99 mg/dL   BUN 15 6 - 20 mg/dL   Creatinine, Ser 1.09 0.61 - 1.24 mg/dL   Calcium 9.5 8.9  - 10.3 mg/dL   GFR calc non Af Amer >60 >60 mL/min   GFR calc Af Amer >60 >60 mL/min   Anion gap 11 5 - 15  CBC with Differential/Platelet  Result Value Ref Range   WBC 9.9 4.0 - 10.5 K/uL   RBC 4.27 4.22 - 5.81 MIL/uL   Hemoglobin 12.2 (L) 13.0 - 17.0 g/dL   HCT 35.3 (L) 39.0 - 52.0 %   MCV 82.7 78.0 - 100.0 fL   MCH 28.6 26.0 - 34.0 pg   MCHC 34.6 30.0 - 36.0 g/dL   RDW 13.4 11.5 - 15.5 %   Platelets 272 150 - 400 K/uL   Neutrophils Relative % 75 %   Neutro Abs 7.5 1.7 - 7.7 K/uL   Lymphocytes Relative 15 %   Lymphs Abs 1.5 0.7 - 4.0 K/uL   Monocytes Relative 7 %   Monocytes Absolute 0.7 0.1 - 1.0 K/uL   Eosinophils Relative 2 %   Eosinophils Absolute 0.2 0.0 - 0.7 K/uL   Basophils Relative 1 %   Basophils Absolute 0.1 0.0 - 0.1 K/uL     Imaging Review No results found. I have personally reviewed and evaluated these images and lab results as part of my medical decision-making.   EKG Interpretation None      MDM   Final diagnoses:  Epistaxis, recurrent    Patient with recurrent right nares nosebleed this we the third day that is presented with nosebleed. On day 1 it was cauterized with silver nitrate and started to reoccur with the bleeding on day 2 he had a Rhino Rocket placed. Started having bleeding around that today. Also some slight bleeding from the left nares most likely blood carrying around from the other side. Patient extremely anxious about the nosebleeds. Patient has a history of hypertension. Blood pressures here have been somewhat elevated current blood pressure 161/73. Patient is treated for hypertension at home will not add any additional meds. Patient given Ativan here to help with the anxiety and that should the subsequent help with the blood pressure is well. Ear nose and throat came in and saw the patient and the cup the bleeding under control. Follow-up will be as per them.  Patient's blood  counts here showed no significant drop in hemoglobin. INR is  slightly above 2. Patient did get permission from his cardiologist to hold the Coumadin for now. Patient on Coumadin for atrial fib.    Fredia Sorrow, MD 03/29/15 [phone]

## 2015-03-29 NOTE — Discharge Instructions (Signed)
Nosebleed Nosebleeds are common. A nosebleed can be caused by many things, including:  Getting hit hard in the nose.  Infections.  Dryness in your nose.  A dry climate.  Medicines.  Picking your nose.  Your home heating and cooling systems. HOME CARE   Try controlling your nosebleed by pinching your nostrils gently. Do this for at least 10 minutes.  Avoid blowing or sniffing your nose for a number of hours after having a nosebleed.  Do not put gauze inside of your nose yourself. If your nose was packed by your doctor, try to keep the pack inside of your nose until your doctor removes it.  If a gauze pack was used and it starts to fall out, gently replace it or cut off the end of it.  If a balloon catheter was used to pack your nose, do not cut or remove it unless told by your doctor.  Avoid lying down while you are having a nosebleed. Sit up and lean forward.  Use a nasal spray decongestant to help with a nosebleed as told by your doctor.  Do not use petroleum jelly or mineral oil in your nose. These can drip into your lungs.  Keep your house humid by using:  Less air conditioning.  A humidifier.  Aspirin and blood thinners make bleeding more likely. If you are prescribed these medicines and you have nosebleeds, ask your doctor if you should stop taking the medicines or adjust the dose. Do not stop medicines unless told by your doctor.  Resume your normal activities as you are able. Avoid straining, lifting, or bending at your waist for several days.  If your nosebleed was caused by dryness in your nose, use over-the-counter saline nasal spray or gel. If you must use a lubricant:  Choose one that is water-soluble.  Use it only as needed.  Do not use it within several hours of lying down.  Keep all follow-up visits as told by your doctor. This is important. GET HELP IF:  You have a fever.  You get frequent nosebleeds.  You are getting nosebleeds more  often. GET HELP RIGHT AWAY IF:  Your nosebleed lasts longer than 20 minutes.  Your nosebleed occurs after an injury to your face, and your nose looks crooked or broken.  You have unusual bleeding from other parts of your body.  You have unusual bruising on other parts of your body.  You feel light-headed or dizzy.  You become sweaty.  You throw up (vomit) blood.  You have a nosebleed after a head injury.   This information is not intended to replace advice given to you by your health care provider. Make sure you discuss any questions you have with your health care provider.   Follow-up as per your nose and throat Dr. Wilburn Cornelia. Follow-up with your cardiologist for when to resume your Coumadin. Today's labs and Coumadin level without any significant abnormalities. Coumadin level was therapeutic.     Document Released: 11/25/2007 Document Revised: 03/08/2014 Document Reviewed: 10/01/2013 Elsevier Interactive Patient Education [employer].

## 2015-03-29 NOTE — ED Notes (Signed)
Pt very upset, pt requesting the dr. Abbott Pao stated "my nose has been bleeding for three days, no one has done anything about it and i'm tired of holding it" family at bedside. RN aware

## 2015-03-29 NOTE — Consult Note (Signed)
Reason for Consult:Epistaxis Referring Physician: ER  [name] is an 82 y.o. male.  HPI: 82 year old male on Plavix and Coumadin due to aortic stenosis, CAD, and atrial fibrillation has had right-sided nosebleeds since 11/26.  He has come to the ER three times now for bleeding with an anterior pack placed during the second visit.  With the pack in place this morning, he started bleeding around the pack while brushing his teeth.  He has held Coumadin since yesterday per his cardiologist.  Past Medical History  Diagnosis Date  . Mild aortic stenosis   . HTN (hypertension)   . Hyperlipemia   . Bladder outlet obstruction   . BPH (benign prostatic hyperplasia)   . Kidney stones   . Abdominal pain   . Constipation   . Bladder outlet obstruction   . Acute renal failure ([place])      resolved  . Urinary tract infection      Enterococcus  . Rib fractures     left rib fractures being treated with pain medications  . CAD (coronary artery disease)     a. CABG IN 1989. b. 01/08/2015 CTO of ost LAD, LIMA to LAD not visualized but assumed patent given myoview finding, occluded SVG to diagonal, 99% mid RCA tx w/ SYNERGY DES 3X28 mm  . Diabetes mellitus type 2, insulin dependent ([place])   . Atrial fibrillation ([place])   . GERD (gastroesophageal reflux disease)   . Arthritis     "hands & legs" (11/'10/2014)    Past Surgical History  Procedure Laterality Date  . Coronary artery bypass graft  1989    "CABG X 2"  . Total hip arthroplasty Right 2000  . Cardiac catheterization  1989  . Carpal tunnel release Right 08/2010  . Cataract extraction w/ intraocular lens  implant, bilateral Bilateral   . Joint replacement    . Cardiac catheterization N/A 01/08/2015    Procedure: Left Heart Cath and Cors/Grafts Angiography;  Surgeon: Peter M Martinique, MD;  Location: [facility];  Service: Cardiovascular;  Laterality: N/A;  . Cardiac catheterization  01/08/2015    Procedure: Coronary Stent Intervention;   Surgeon: Peter M Martinique, MD;  Location: [facility];  Service: Cardiovascular;;  . Tonsillectomy    . Coronary angioplasty      Family History  Problem Relation Age of Onset  . Brain cancer Father   . Throat cancer Brother     Social History:  reports that he quit smoking about 56 years ago. His smoking use included Cigarettes. He has a 30 pack-year smoking history. He has never used smokeless tobacco. He reports that he does not drink alcohol or use illicit drugs.  Allergies:  Allergies  Allergen Reactions  . Amoxicillin Other (See Comments)    Unknown  . Cefadroxil Other (See Comments)    Unknown  . Ciprofloxacin Other (See Comments)    Unknown  . Erythromycin Other (See Comments)    Upset stomach    Medications: I have reviewed the patient's current medications.  Results for orders placed or performed during the hospital encounter of 03/29/15 (from the past 48 hour(s))  Protime-INR - (order if Patient is taking Coumadin / Warfarin)     Status: Abnormal   Collection Time: 03/29/15  9:57 AM  Result Value Ref Range   Prothrombin Time 23.0 (H) 11.6 - 15.2 seconds   INR 2.05 (H) 0.00 - 3.33  Basic metabolic panel     Status: Abnormal  Collection Time: 03/29/15  9:57 AM  Result Value Ref Range   Sodium 140 135 - 145 mmol/L   Potassium 3.8 3.5 - 5.1 mmol/L   Chloride 102 101 - 111 mmol/L   CO2 27 22 - 32 mmol/L   Glucose, Bld 211 (H) 65 - 99 mg/dL   BUN 15 6 - 20 mg/dL   Creatinine, Ser 1.09 0.61 - 1.24 mg/dL   Calcium 9.5 8.9 - 10.3 mg/dL   GFR calc non Af Amer >60 >60 mL/min   GFR calc Af Amer >60 >60 mL/min    Comment: (NOTE) The eGFR has been calculated using the CKD EPI equation. This calculation has not been validated in all clinical situations. eGFR's persistently <60 mL/min signify possible Chronic Kidney Disease.    Anion gap 11 5 - 15  CBC with Differential/Platelet     Status: Abnormal   Collection Time: 03/29/15  9:57 AM  Result Value Ref Range    WBC 9.9 4.0 - 10.5 K/uL   RBC 4.27 4.22 - 5.81 MIL/uL   Hemoglobin 12.2 (L) 13.0 - 17.0 g/dL   HCT 35.3 (L) 39.0 - 52.0 %   MCV 82.7 78.0 - 100.0 fL   MCH 28.6 26.0 - 34.0 pg   MCHC 34.6 30.0 - 36.0 g/dL   RDW 13.4 11.5 - 15.5 %   Platelets 272 150 - 400 K/uL   Neutrophils Relative % 75 %   Neutro Abs 7.5 1.7 - 7.7 K/uL   Lymphocytes Relative 15 %   Lymphs Abs 1.5 0.7 - 4.0 K/uL   Monocytes Relative 7 %   Monocytes Absolute 0.7 0.1 - 1.0 K/uL   Eosinophils Relative 2 %   Eosinophils Absolute 0.2 0.0 - 0.7 K/uL   Basophils Relative 1 %   Basophils Absolute 0.1 0.0 - 0.1 K/uL    No results found.  Review of Systems  HENT: Positive for nosebleeds.   All other systems reviewed and are negative.  Blood pressure 161/73, pulse 73, temperature 97 F (36.1 C), temperature source Axillary, resp. rate 15, SpO2 98 %. Physical Exam  Constitutional: He is oriented to person, place, and time. He appears well-developed and well-nourished. No distress.  HENT:  Head: Normocephalic and atraumatic.  Right Ear: External ear normal.  Left Ear: External ear normal.  Streaky blood in pharyngeal secretions.  Right nasal passage with loosely positioned Rapid Rhino pack, no active bleeding.  Eyes: Conjunctivae and EOM are normal. Pupils are equal, round, and reactive to light.  Neck: Normal range of motion. Neck supple.  Cardiovascular: Normal rate.   Respiratory: Effort normal.  Musculoskeletal: Normal range of motion.  Neurological: He is alert and oriented to person, place, and time. No cranial nerve deficit.  Skin: Skin is warm and dry.  Psychiatric: He has a normal mood and affect. His behavior is normal. Judgment and thought content normal.    Assessment/Plan: Right epistaxis, anticoagulation We discussed options including further packing, possible cautery in the ER, and operative management.  We agreed to remove the pack in the ER to try to cauterize.  See procedure note.  The bleeding  site was immediately identified on the right anterior septum and was controlled with silver nitrate.  He will be discharged with instructions to avoid blowing or picking and to use saline spray around the clock.  I instructed him to hold his Coumadin a total of four days and then resume if he is not bleeding.  Follow-up in one week.  Ondine Gemme,  Jaydn Moscato 03/29/2015, 11:52 AM

## 2015-03-31 ENCOUNTER — Encounter (HOSPITAL_COMMUNITY): Payer: Medicare Other

## 2015-04-02 ENCOUNTER — Encounter (HOSPITAL_COMMUNITY): Payer: Medicare Other

## 2015-04-02 DIAGNOSIS — R040 Epistaxis: Secondary | ICD-10-CM

## 2015-04-02 HISTORY — DX: Epistaxis: R04.0

## 2015-04-04 ENCOUNTER — Encounter (HOSPITAL_COMMUNITY): Payer: Medicare Other

## 2015-04-07 ENCOUNTER — Encounter (HOSPITAL_COMMUNITY): Payer: Medicare Other

## 2015-04-07 ENCOUNTER — Telehealth: Payer: Self-pay | Admitting: Cardiology

## 2015-04-07 NOTE — Telephone Encounter (Signed)
Returned call. Pt inquiring about getting back into Cardiac Rehab 3 days a week. He had understanding that Dr. Martinique would need to approve this and this would come after next office visit. Wanted to see if appt could be/needed to be moved up. Pt aware Dr. Martinique and Malachy Mood out of office today - he is requesting follow up call about this from Elida at earliest convenience.

## 2015-04-07 NOTE — Telephone Encounter (Signed)
Returned call to patient.Dr.Jordan advised ok to resume cardiac rehab.I will send a message to [facility] in cardiac rehab and let her know.

## 2015-04-07 NOTE — Telephone Encounter (Signed)
New message   Pt calling for RN

## 2015-04-07 NOTE — Telephone Encounter (Signed)
He is OK to resume Cardiac Rehab. Will need to call Cardiac Rehab and let them know.  Kshawn Canal Martinique MD, [facility]

## 2015-04-08 ENCOUNTER — Telehealth (HOSPITAL_COMMUNITY): Payer: Self-pay | Admitting: *Deleted

## 2015-04-08 DIAGNOSIS — R040 Epistaxis: Secondary | ICD-10-CM | POA: Diagnosis not present

## 2015-04-08 DIAGNOSIS — M75111 Incomplete rotator cuff tear or rupture of right shoulder, not specified as traumatic: Secondary | ICD-10-CM | POA: Diagnosis not present

## 2015-04-08 DIAGNOSIS — Z7901 Long term (current) use of anticoagulants: Secondary | ICD-10-CM | POA: Diagnosis not present

## 2015-04-09 ENCOUNTER — Encounter (HOSPITAL_COMMUNITY): Payer: Medicare Other

## 2015-04-09 ENCOUNTER — Encounter (HOSPITAL_COMMUNITY)
Admission: RE | Admit: 2015-04-09 | Discharge: 2015-04-09 | Disposition: A | Discharge status: Home / Self Care | Payer: Medicare Other | Source: Ambulatory Visit | Attending: Cardiology | Admitting: Cardiology

## 2015-04-09 ENCOUNTER — Telehealth: Payer: Self-pay | Admitting: Cardiology

## 2015-04-09 DIAGNOSIS — Z955 Presence of coronary angioplasty implant and graft: Secondary | ICD-10-CM | POA: Insufficient documentation

## 2015-04-09 NOTE — Telephone Encounter (Signed)
Pt wants to talk to [facility] re appt he just made with [facility] call [phone]

## 2015-04-09 NOTE — Telephone Encounter (Signed)
Informed pt [name] out today, asked if anything I could help w/. Pt a bit frazzled about going to C Rehab this morning and being sent home. Was uncertain why they sent him home and then scheduled appt for Friday. Gave reassurance, discussed rationale for appt. Pt to see [facility] Friday AM for A Fib. He is currently asymptomatic. Pt aware to call if symptoms or new concerns.

## 2015-04-09 NOTE — Progress Notes (Signed)
Heart rate noted in the 115-125 Atrial fibrillation resting heart rate. Mr [name] is in chronic Atrial fibrillation. Patient asymptomatic. Medications reviewed. Blood pressure 102/60. Oxygen saturation 97% on room air. Temperature 97.9. Cecilie Kicks FNP-C called and notified. Patient will not exercise today. Mickel Baas said an appointment will be made for the patient to be seen in the office for evaluation this week. [name] and his wife left cardiac rehab without complaints or concerns.

## 2015-04-11 ENCOUNTER — Ambulatory Visit: Payer: Medicare Other | Admitting: Cardiology

## 2015-04-11 ENCOUNTER — Encounter (HOSPITAL_COMMUNITY): Payer: Medicare Other

## 2015-04-11 ENCOUNTER — Encounter: Payer: Self-pay | Admitting: Cardiology

## 2015-04-11 ENCOUNTER — Ambulatory Visit (INDEPENDENT_AMBULATORY_CARE_PROVIDER_SITE_OTHER): Payer: Medicare Other | Admitting: Cardiology

## 2015-04-11 VITALS — BP 128/64 | HR 84 | Ht 66.0 in | Wt 220.4 lb

## 2015-04-11 DIAGNOSIS — I482 Chronic atrial fibrillation, unspecified: Secondary | ICD-10-CM

## 2015-04-11 DIAGNOSIS — Z951 Presence of aortocoronary bypass graft: Secondary | ICD-10-CM | POA: Diagnosis not present

## 2015-04-11 DIAGNOSIS — I10 Essential (primary) hypertension: Secondary | ICD-10-CM

## 2015-04-11 DIAGNOSIS — R040 Epistaxis: Secondary | ICD-10-CM

## 2015-04-11 DIAGNOSIS — E119 Type 2 diabetes mellitus without complications: Secondary | ICD-10-CM

## 2015-04-11 DIAGNOSIS — R931 Abnormal findings on diagnostic imaging of heart and coronary circulation: Secondary | ICD-10-CM | POA: Diagnosis not present

## 2015-04-11 DIAGNOSIS — Z794 Long term (current) use of insulin: Secondary | ICD-10-CM

## 2015-04-11 DIAGNOSIS — Z9861 Coronary angioplasty status: Secondary | ICD-10-CM

## 2015-04-11 DIAGNOSIS — I2510 Atherosclerotic heart disease of native coronary artery without angina pectoris: Secondary | ICD-10-CM

## 2015-04-11 DIAGNOSIS — R9439 Abnormal result of other cardiovascular function study: Secondary | ICD-10-CM

## 2015-04-11 DIAGNOSIS — Z7901 Long term (current) use of anticoagulants: Secondary | ICD-10-CM

## 2015-04-11 NOTE — Patient Instructions (Addendum)
Medication Instructions:  Your physician recommends that you continue on your current medications as directed. Please refer to the Current Medication list given to you today.   Labwork: None ordered  Testing/Procedures: None ordered  Follow-Up: Your physician recommends that you keep your scheduled follow-up appointment with Dr. Martinique as planned 04/28/15   Any Other Special Instructions Will Be Listed Below (If Applicable).   If you need a refill on your cardiac medications before your next appointment, please call your pharmacy.

## 2015-04-11 NOTE — Assessment & Plan Note (Signed)
Recurrent epistaxis last week requiring 3 ED visits last week. Finally controlled by Dr Redmond Baseman with cauterization but he has had one recurrent episode since.

## 2015-04-11 NOTE — Assessment & Plan Note (Signed)
Controlled.  

## 2015-04-11 NOTE — Assessment & Plan Note (Signed)
Coumadin. His INR has been well controlled running  2-2.5

## 2015-04-11 NOTE — Assessment & Plan Note (Signed)
CHADs VASc=5 for age, HTN, vascular disease, and DM

## 2015-04-11 NOTE — Progress Notes (Signed)
04/11/2015 [name]   02-09-1929  [id number]  Primary Physician ANDY,CAMILLE L, MD Primary Cardiologist: Dr Martinique  HPI:  82 year old male with past medical history of HTN, HLD, DM2, benign positional vertigo, chronic atrial fibrillation on Coumadin and CAD s/p CABG x 2 1990. He was seen 12/19/2014 with DOE. Subsequent work up revealed an abnormal Myoview.  He presented for outpatient cardiac catheterization on 01/08/2015 which showed CTO of ost LAD, LIMA to LAD not visualized but assumed patent given myoview finding, occluded SVG to diagonal, and a 99% mid RCA lesion treated with SYNERGY DES 3X28 mm. postprocedure, he had some bradycardia, his Toprol-XL was cut down to 50 mg daily. Echocardiogram was performed on the same day which showed EF 55-60%, no RWMA, moderate LVH. He has done well since.          He is in the office today as a referral from cardiac rehab. Apparently his HR was 115 when he went to rehab and he was set up for this visit to be checked. The pt is unaware of his AF. The pt also related a history of recurrent epistaxis this past few weeks. He was seen in the ED 3 times for this in Jan. He was seen by Dr Redmond Baseman (ENT) on 1/28 and cauterized. Since then the pt has had one more episode at home that was self limited.    Current Outpatient Prescriptions  Medication Sig Dispense Refill  . AVODART 0.5 MG capsule Take 0.5 mg by mouth every other day.     . clopidogrel (PLAVIX) 75 MG tablet Take 1 tablet (75 mg total) by mouth daily with breakfast. 90 tablet 3  . diltiazem (CARDIZEM CD) 360 MG 24 hr capsule Take 1 capsule (360 mg total) by mouth daily. 30 capsule 6  . glucose blood (PRECISION XTRA TEST STRIPS) test strip 1 each by Other route See admin instructions. Check blood sugar once daily    . insulin glargine (LANTUS) 100 UNIT/ML injection Inject 32 Units into the skin daily.     [name] lisinopril (PRINIVIL,ZESTRIL) 5 MG tablet Take 1 tablet (5 mg total) by mouth daily. 30  tablet 6  . metFORMIN (GLUCOPHAGE) 500 MG tablet Take 1,000 mg by mouth 2 (two) times daily with a meal.     . metoprolol succinate (TOPROL-XL) 100 MG 24 hr tablet Take 100 mg by mouth daily.     . nitroGLYCERIN (NITROSTAT) 0.4 MG SL tablet Place 0.4 mg under the tongue every 5 (five) minutes as needed for chest pain (x 3 doses).    . pantoprazole (PROTONIX) 40 MG tablet Take 1 tablet (40 mg total) by mouth daily at 12 noon. 90 tablet 3  . simvastatin (ZOCOR) 80 MG tablet Take 40 mg by mouth at bedtime.     . Tamsulosin HCl (FLOMAX) 0.4 MG CAPS Take 0.4 mg by mouth every other day.     . warfarin (COUMADIN) 5 MG tablet 1 tablet daily except 1.5 tablets on Wednesdays or as directed by coumadin clinic (Patient taking differently: Take 5-7.5 mg by mouth daily. Take 5mg  by mouth daily except take 7.5 mg by mouth on Sunday and Wednesday.) 120 tablet 1   No current facility-administered medications for this visit.    Allergies  Allergen Reactions  . Amoxicillin Other (See Comments)    Unknown  . Cefadroxil Other (See Comments)    Unknown  . Ciprofloxacin Other (See Comments)    Unknown  . Erythromycin Other (See Comments)  Upset stomach    Social History   Social History  . Marital Status: Married    Spouse Name: N/A  . Number of Children: 3  . Years of Education: N/A   Occupational History  . Agricultural consultant    Social History Main Topics  . Smoking status: Former Smoker -- 3.00 packs/day for 10 years    Types: Cigarettes    Quit date: 05/11/1958  . Smokeless tobacco: Never Used  . Alcohol Use: No  . Drug Use: No  . Sexual Activity: Not Currently   Other Topics Concern  . Not on file   Social History Narrative     Review of Systems: General: negative for chills, fever, night sweats or weight changes.  Cardiovascular: negative for chest pain, dyspnea on exertion, edema, orthopnea, palpitations, paroxysmal nocturnal dyspnea or shortness of breath Dermatological:  negative for rash Respiratory: negative for cough or wheezing Urologic: negative for hematuria Abdominal: negative for nausea, vomiting, diarrhea, bright red blood per rectum, melena, or hematemesis Neurologic: negative for visual changes, syncope, or dizziness All other systems reviewed and are otherwise negative except as noted above.    Blood pressure 128/64, pulse 84, height 5\' 6"  (1.676 m), weight 220 lb 6.4 oz (99.973 kg).  General appearance: alert, cooperative, no distress and mildly obese Lungs: clear to auscultation bilaterally Heart: irregularly irregular rhythm Extremities: no edema Neurologic: Grossly normal  EKG AF with VR 84  ASSESSMENT AND PLAN:   Epistaxis, recurrent Recurrent epistaxis last week requiring 3 ED visits last week. Finally controlled by Dr Redmond Baseman with cauterization but he has had one recurrent episode since.   Diabetes mellitus type 2, insulin dependent ([place]) Controlled  CAD S/P PCI- Nov 2016 RCA DES placed Nov 2016, LIMA-LAD presumed patent based on Myoview but no visualized, SVG-Dx occluded  Hx of CABG x 2 1990 Status post CABG x2 in 1990 including an LIMA graft to the LAD, and a vein graft to the diagonal.   Chronic atrial fibrillation ([place]) CHADs VASc=5 for age, HTN, vascular disease, and DM  Chronic anticoagulation Coumadin. His INR has been well controlled running  2-2.5   PLAN  His HR is controlled on his current medications. He did have some bradycardia documented when he had his stent and Toprol was decreased. I have not changed his medication and will instruct Cardiac Rehab to give [place] more latitude in regards to his HR. I would not be concerned unless his HR is sustained > 120.  As far his his recurrent epistaxis I wonder if he would be a Watchman candidate. He will need to be on Coumadin and Plavix indefinitely. I will forward this office note to [employer] to review. Of course any final determination would be with Dr Martinique. The  pt has an appointment with Dr Martinique later this month and will keep it.   Kerin Ransom K [name] 04/11/2015 8:54 AM

## 2015-04-11 NOTE — Assessment & Plan Note (Signed)
RCA DES placed Nov 2016, LIMA-LAD presumed patent based on Myoview but no visualized, SVG-Dx occluded

## 2015-04-11 NOTE — Assessment & Plan Note (Signed)
Status post CABG x2 in 1990 including an LIMA graft to the LAD, and a vein graft to the diagonal.

## 2015-04-14 ENCOUNTER — Telehealth: Payer: Self-pay | Admitting: Physician Assistant

## 2015-04-14 ENCOUNTER — Encounter (HOSPITAL_COMMUNITY): Payer: Medicare Other

## 2015-04-14 ENCOUNTER — Encounter (HOSPITAL_COMMUNITY)
Admission: RE | Admit: 2015-04-14 | Discharge: 2015-04-14 | Disposition: A | Discharge status: Home / Self Care | Payer: Medicare Other | Source: Ambulatory Visit | Attending: Cardiology | Admitting: Cardiology

## 2015-04-14 DIAGNOSIS — Z955 Presence of coronary angioplasty implant and graft: Secondary | ICD-10-CM | POA: Diagnosis not present

## 2015-04-14 NOTE — Progress Notes (Signed)
[name] returned to exercise this morning at cardiac rehab. Resting heart rate 96 after resting a few minutes. [name] exercised without complaints. [name] heart rate was noted at 160 on the airdyne  Nonsustained. Vin Baghat PAC called and notified. [name] came to cardiac rehab and evaluated the patient. No new order received. Oxygen saturation 96% on room air. Exit blood pressure 110/78. Will continue to monitor the patient throughout  the program.

## 2015-04-14 NOTE — Telephone Encounter (Signed)
Paged by Barnet Pall, RN at Cardiac rehab with patient rate of 160s with exercise. Patient has chronic afib. Upon my arrive the patients was 80-110s at rest. The patient denies nausea, vomiting, fever, chest pain, palpitations, shortness of breath, orthopnea, PND, dizziness, syncope, cough, congestion, abdominal pain, hematochezia, melena, lower extremity edema.  The patient was seen by [facility] 04/11/15 see note for further detail. Hx of bradycardia.   Will defer further management to Dr. Martinique. Advised to continue to monitor. Continue current medications.    Shadia Larose, [place]

## 2015-04-16 ENCOUNTER — Encounter (HOSPITAL_COMMUNITY): Payer: Medicare Other

## 2015-04-16 ENCOUNTER — Telehealth: Payer: Self-pay

## 2015-04-16 ENCOUNTER — Telehealth (HOSPITAL_COMMUNITY): Payer: Self-pay | Admitting: *Deleted

## 2015-04-16 ENCOUNTER — Encounter (HOSPITAL_COMMUNITY)
Admission: RE | Admit: 2015-04-16 | Discharge: 2015-04-16 | Disposition: A | Discharge status: Home / Self Care | Payer: Medicare Other | Source: Ambulatory Visit | Attending: Cardiology | Admitting: Cardiology

## 2015-04-16 DIAGNOSIS — Z955 Presence of coronary angioplasty implant and graft: Secondary | ICD-10-CM | POA: Diagnosis not present

## 2015-04-16 NOTE — Telephone Encounter (Signed)
Received a call from patient 04/15/15 wanting to ask Dr.Jordan if ok to return to cardiac rehab 04/16/15.Stated he had a episode of fast heart beat on Monday 04/14/15.Stated he feels good today.Spoke to [place] he advised ok to return to cardiac rehab.

## 2015-04-16 NOTE — Telephone Encounter (Signed)
-----   Message from Peter M Martinique, MD sent at 04/16/2015 11:41 AM EST ----- Regarding: RE: Target Heart Rate Increase I would say 140 for now.   Peter Martinique MD, [facility]   ----- Message -----    From: Clotilde Dieter    Sent: 04/16/2015  10:29 AM      To: Peter M Martinique, MD Subject: Target Heart Rate Increase                     Dr. Martinique,  [name] has been running high for his heart rate on the bike.  He is now routinely in the 140-150s and his THR is 55-109.  What is a safe upper limit for him for exercise?  Thanks for your help! Alberteen Sam, MA, ACSM RCEP

## 2015-04-17 DIAGNOSIS — E119 Type 2 diabetes mellitus without complications: Secondary | ICD-10-CM | POA: Diagnosis not present

## 2015-04-17 DIAGNOSIS — H35351 Cystoid macular degeneration, right eye: Secondary | ICD-10-CM | POA: Diagnosis not present

## 2015-04-17 DIAGNOSIS — H35041 Retinal micro-aneurysms, unspecified, right eye: Secondary | ICD-10-CM | POA: Diagnosis not present

## 2015-04-17 DIAGNOSIS — H348312 Tributary (branch) retinal vein occlusion, right eye, stable: Secondary | ICD-10-CM | POA: Diagnosis not present

## 2015-04-18 ENCOUNTER — Encounter (HOSPITAL_COMMUNITY): Payer: Medicare Other

## 2015-04-18 ENCOUNTER — Encounter (HOSPITAL_COMMUNITY)
Admission: RE | Admit: 2015-04-18 | Discharge: 2015-04-18 | Disposition: A | Discharge status: Home / Self Care | Payer: Medicare Other | Source: Ambulatory Visit | Attending: Cardiology | Admitting: Cardiology

## 2015-04-18 DIAGNOSIS — Z87898 Personal history of other specified conditions: Secondary | ICD-10-CM | POA: Diagnosis not present

## 2015-04-18 DIAGNOSIS — I2510 Atherosclerotic heart disease of native coronary artery without angina pectoris: Secondary | ICD-10-CM | POA: Insufficient documentation

## 2015-04-18 DIAGNOSIS — Z9229 Personal history of other drug therapy: Secondary | ICD-10-CM | POA: Diagnosis not present

## 2015-04-18 DIAGNOSIS — Z9861 Coronary angioplasty status: Secondary | ICD-10-CM

## 2015-04-18 DIAGNOSIS — Z09 Encounter for follow-up examination after completed treatment for conditions other than malignant neoplasm: Secondary | ICD-10-CM | POA: Diagnosis not present

## 2015-04-18 DIAGNOSIS — Z955 Presence of coronary angioplasty implant and graft: Secondary | ICD-10-CM | POA: Diagnosis not present

## 2015-04-18 LAB — GLUCOSE, CAPILLARY: Glucose-Capillary: 125 mg/dL — ABNORMAL HIGH (ref 65–99)

## 2015-04-21 ENCOUNTER — Other Ambulatory Visit: Payer: Self-pay | Admitting: Pharmacist Clinician (PhC)/ Clinical Pharmacy Specialist

## 2015-04-21 ENCOUNTER — Encounter (HOSPITAL_COMMUNITY)
Admission: RE | Admit: 2015-04-21 | Discharge: 2015-04-21 | Disposition: A | Discharge status: Home / Self Care | Payer: Medicare Other | Source: Ambulatory Visit | Attending: Cardiology | Admitting: Cardiology

## 2015-04-21 ENCOUNTER — Encounter (HOSPITAL_COMMUNITY): Payer: Medicare Other

## 2015-04-21 DIAGNOSIS — Z955 Presence of coronary angioplasty implant and graft: Secondary | ICD-10-CM | POA: Diagnosis not present

## 2015-04-21 MED ORDER — WARFARIN SODIUM 5 MG PO TABS: ORAL_TABLET | ORAL | Status: DC

## 2015-04-23 ENCOUNTER — Encounter (HOSPITAL_COMMUNITY)
Admission: RE | Admit: 2015-04-23 | Discharge: 2015-04-23 | Disposition: A | Discharge status: Home / Self Care | Payer: Medicare Other | Source: Ambulatory Visit | Attending: Cardiology | Admitting: Cardiology

## 2015-04-23 ENCOUNTER — Encounter (HOSPITAL_COMMUNITY): Payer: Medicare Other

## 2015-04-23 DIAGNOSIS — Z955 Presence of coronary angioplasty implant and graft: Secondary | ICD-10-CM | POA: Diagnosis not present

## 2015-04-25 ENCOUNTER — Encounter (HOSPITAL_COMMUNITY): Payer: Medicare Other

## 2015-04-25 ENCOUNTER — Encounter (HOSPITAL_COMMUNITY)
Admission: RE | Admit: 2015-04-25 | Discharge: 2015-04-25 | Disposition: A | Discharge status: Home / Self Care | Payer: Medicare Other | Source: Ambulatory Visit | Attending: Cardiology | Admitting: Cardiology

## 2015-04-25 DIAGNOSIS — Z955 Presence of coronary angioplasty implant and graft: Secondary | ICD-10-CM | POA: Diagnosis not present

## 2015-04-25 LAB — GLUCOSE, CAPILLARY: Glucose-Capillary: 178 mg/dL — ABNORMAL HIGH (ref 65–99)

## 2015-04-28 ENCOUNTER — Ambulatory Visit (INDEPENDENT_AMBULATORY_CARE_PROVIDER_SITE_OTHER): Payer: Medicare Other | Admitting: Pharmacist Clinician (PhC)/ Clinical Pharmacy Specialist

## 2015-04-28 ENCOUNTER — Ambulatory Visit (INDEPENDENT_AMBULATORY_CARE_PROVIDER_SITE_OTHER): Payer: Medicare Other | Admitting: Cardiology

## 2015-04-28 ENCOUNTER — Encounter (HOSPITAL_COMMUNITY): Payer: Medicare Other

## 2015-04-28 ENCOUNTER — Encounter: Payer: Self-pay | Admitting: Cardiology

## 2015-04-28 VITALS — BP 128/70 | HR 68 | Ht 66.0 in | Wt 221.7 lb

## 2015-04-28 DIAGNOSIS — Z9861 Coronary angioplasty status: Secondary | ICD-10-CM

## 2015-04-28 DIAGNOSIS — I4891 Unspecified atrial fibrillation: Secondary | ICD-10-CM

## 2015-04-28 DIAGNOSIS — Z7901 Long term (current) use of anticoagulants: Secondary | ICD-10-CM

## 2015-04-28 DIAGNOSIS — I2510 Atherosclerotic heart disease of native coronary artery without angina pectoris: Secondary | ICD-10-CM

## 2015-04-28 DIAGNOSIS — Z5181 Encounter for therapeutic drug level monitoring: Secondary | ICD-10-CM

## 2015-04-28 DIAGNOSIS — I482 Chronic atrial fibrillation, unspecified: Secondary | ICD-10-CM

## 2015-04-28 DIAGNOSIS — Z951 Presence of aortocoronary bypass graft: Secondary | ICD-10-CM | POA: Diagnosis not present

## 2015-04-28 DIAGNOSIS — I10 Essential (primary) hypertension: Secondary | ICD-10-CM | POA: Diagnosis not present

## 2015-04-28 LAB — POCT INR: INR: 2.6

## 2015-04-28 MED ORDER — METOPROLOL SUCCINATE ER 100 MG PO TB24: 100.0000 mg | ORAL_TABLET | Freq: Every day | ORAL | Status: DC

## 2015-04-28 NOTE — Patient Instructions (Signed)
Increase Toprol XL to 100 mg in the morning and 50 mg in the evening.  Continue your other therapy.  I will see you in 3 months

## 2015-04-28 NOTE — Progress Notes (Signed)
[name] Date of Birth: 08/11/1928   History of Present Illness: [name] is seen for followup atrial fibrillation and CAD.  He  Has a history of chronic atrial fibrillation managed with rate control and anticoagulation with coumadin. He also has a history of CAD s/p CABG in 1990.[name] Echo in 2013 showed normal LV function. Very mild AS.  biatrial enlargement. In October he had an abnormal [place] which showed inferior wall ischemia. He underwent cardiac cath. This showed occlusion of the proximal LAD and a critical stenosis of the mid RCA. The SVG to diagonal was occluded. The LIMA to the LAD was patent. He had stenting of the mid RCA with a DES. He is on ASA and Plavix.   In late January he had a severe nosebleed. He went to the ED 3 days in a row. He had packing but it did not stop. Subsequently had cautery by Dr. Redmond Baseman. No recurrent bleeding since then.  On follow up today he is feeling well.  He denies any significant palpitations,  chest pain, or shortness of breath. His vertigo has improved. At Cardiac Rehab his resting pulse has been 95 and goes up to 150 with exercise.   Current Outpatient Prescriptions on File Prior to Visit  Medication Sig Dispense Refill  . AVODART 0.5 MG capsule Take 0.5 mg by mouth every other day.     . clopidogrel (PLAVIX) 75 MG tablet Take 1 tablet (75 mg total) by mouth daily with breakfast. 90 tablet 3  . diltiazem (CARDIZEM CD) 360 MG 24 hr capsule Take 1 capsule (360 mg total) by mouth daily. 30 capsule 6  . glucose blood (PRECISION XTRA TEST STRIPS) test strip 1 each by Other route See admin instructions. Check blood sugar once daily    . insulin glargine (LANTUS) 100 UNIT/ML injection Inject 32 Units into the skin daily.     [name] lisinopril (PRINIVIL,ZESTRIL) 5 MG tablet Take 1 tablet (5 mg total) by mouth daily. 30 tablet 6  . metFORMIN (GLUCOPHAGE) 500 MG tablet Take 1,000 mg by mouth 2 (two) times daily with a meal.     . nitroGLYCERIN  (NITROSTAT) 0.4 MG SL tablet Place 0.4 mg under the tongue every 5 (five) minutes as needed for chest pain (x 3 doses).    . pantoprazole (PROTONIX) 40 MG tablet Take 1 tablet (40 mg total) by mouth daily at 12 noon. 90 tablet 3  . simvastatin (ZOCOR) 80 MG tablet Take 40 mg by mouth at bedtime.     . Tamsulosin HCl (FLOMAX) 0.4 MG CAPS Take 0.4 mg by mouth every other day.     . warfarin (COUMADIN) 5 MG tablet Take 1 to 1.5 tablets by mouth daily as directed by coumadin clinic 120 tablet 1   No current facility-administered medications on file prior to visit.    Allergies  Allergen Reactions  . Amoxicillin Other (See Comments)    Unknown  . Cefadroxil Other (See Comments)    Unknown  . Ciprofloxacin Other (See Comments)    Unknown  . Erythromycin Other (See Comments)    Upset stomach    Past Medical History  Diagnosis Date  . Mild aortic stenosis   . HTN (hypertension)   . Hyperlipemia   . Bladder outlet obstruction   . BPH (benign prostatic hyperplasia)   . Kidney stones   . Abdominal pain   . Constipation   . Bladder outlet obstruction   . Acute renal failure ([place])  resolved  . Urinary tract infection      Enterococcus  . Rib fractures     left rib fractures being treated with pain medications  . CAD (coronary artery disease)     a. CABG IN 1989. b. 01/08/2015 CTO of ost LAD, LIMA to LAD not visualized but assumed patent given myoview finding, occluded SVG to diagonal, 99% mid RCA tx w/ SYNERGY DES 3X28 mm  . Diabetes mellitus type 2, insulin dependent ([place])   . Atrial fibrillation ([place])   . GERD (gastroesophageal reflux disease)   . Arthritis     "hands & legs" (11/'10/2014)  . Stented coronary artery Nov 2016    RCA DES  . Epistaxis, recurrent Feb 2017    Past Surgical History  Procedure Laterality Date  . Coronary artery bypass graft  1989    "CABG X 2"  . Total hip arthroplasty Right 2000  . Cardiac catheterization  1989  . Carpal tunnel release Right  08/2010  . Cataract extraction w/ intraocular lens  implant, bilateral Bilateral   . Joint replacement    . Cardiac catheterization N/A 01/08/2015    Procedure: Left Heart Cath and Cors/Grafts Angiography;  Surgeon: Peter M Martinique, MD;  Location: [facility];  Service: Cardiovascular;  Laterality: N/A;  . Cardiac catheterization  01/08/2015    Procedure: Coronary Stent Intervention;  Surgeon: Peter M Martinique, MD;  Location: [facility];  Service: Cardiovascular;;  . Tonsillectomy    . Coronary angioplasty  01/08/15    RCA DES    History  Smoking status  . Former Smoker -- 3.00 packs/day for 10 years  . Types: Cigarettes  . Quit date: 05/11/1958  Smokeless tobacco  . Never Used    History  Alcohol Use No    Family History  Problem Relation Age of Onset  . Brain cancer Father   . Throat cancer Brother     Review of Systems: As noted in history of present illness.  All other systems were reviewed and are negative.  Physical Exam: BP 128/70 mmHg  Pulse 68  Ht 5\' 6"  (1.676 m)  Wt 100.562 kg (221 lb 11.2 oz)  BMI 35.80 kg/m2 He is a pleasant elderly white male who is obese. HEENT exam is unremarkable. Sclera are clear. PERRLA. Oropharynx is clear. Neck is supple no JVD, adenopathy, thyromegaly, or bruits. Lungs are clear. Cardiac exam reveals a grade 1/6 systolic ejection murmur right upper sternal border. His pulse is irregular. Old median sternotomy scar. Abdomen is obese, soft, nontender. He has no edema. Pedal pulses are palpable. He is alert and oriented x3. Cranial nerves II through XII are intact. Skin is warm and dry.  LABORATORY DATA: Echo: 01/08/15:Study Conclusions  - Left ventricle: The cavity size was normal. Wall thickness was increased in a pattern of moderate LVH. Systolic function was normal. The estimated ejection fraction was in the range of 55% to 60%. Wall motion was normal; there were no regional wall motion abnormalities. - Aortic  valve: There was trivial regurgitation. Valve area (VTI): 1.94 cm^2. Valve area (Vmax): 2.15 cm^2. Valve area (Vmean): 2.1 cm^2. - Right atrium: The atrium was mildly dilated.  Lab Results  Component Value Date   WBC 9.9 03/29/2015   HGB 12.2* 03/29/2015   HCT 35.3* 03/29/2015   PLT 272 03/29/2015   GLUCOSE 211* 03/29/2015   ALT 25 11/28/2010   AST 21 11/28/2010   NA 140 03/29/2015   K 3.8 03/29/2015  CL 102 03/29/2015   CREATININE 1.09 03/29/2015   BUN 15 03/29/2015   CO2 27 03/29/2015   PSA 106.83 Test Methodology: [employer] PSA* 11/09/2009   INR 2.6 04/28/2015   HGBA1C 8.2* 11/28/2010      Assessment / Plan: 1. Chronic atrial fibrillation. Rate control is not optimal based on performance at Cardiac rehab. Will increase Toprol XL to 100 mg in the am and 50 mg in the pm.  On coumadin. INR 2.6. Asymptomatic. Continue Cardizem.   2. CAD s/p remote CABG 25 years ago. S/p DES of RCA 01/08/15. Continue Plavix for at least one year.   3. Chronic anticoagulation- therapeutic.  4. HTN controlled.  5. DM type 2- on insulin  6. Hyperlipidemia.  7. Epistaxis. Improved post cautery. Recommend he stay on Plavix and Coumadin as he has indication for dual therapy. If bleeding becomes a major issue could consider him for a Watchman device but I don't think it is indicated at this time.   I will follow up in 6 months.

## 2015-04-30 ENCOUNTER — Encounter (HOSPITAL_COMMUNITY)
Admission: RE | Admit: 2015-04-30 | Discharge: 2015-04-30 | Disposition: A | Discharge status: Home / Self Care | Payer: Medicare Other | Source: Ambulatory Visit | Attending: Cardiology | Admitting: Cardiology

## 2015-04-30 ENCOUNTER — Encounter (HOSPITAL_COMMUNITY): Payer: Medicare Other

## 2015-04-30 DIAGNOSIS — Z955 Presence of coronary angioplasty implant and graft: Secondary | ICD-10-CM | POA: Insufficient documentation

## 2015-04-30 LAB — GLUCOSE, CAPILLARY: Glucose-Capillary: 177 mg/dL — ABNORMAL HIGH (ref 65–99)

## 2015-05-02 ENCOUNTER — Encounter (HOSPITAL_COMMUNITY)
Admission: RE | Admit: 2015-05-02 | Discharge: 2015-05-02 | Disposition: A | Discharge status: Home / Self Care | Payer: Medicare Other | Source: Ambulatory Visit | Attending: Cardiology | Admitting: Cardiology

## 2015-05-02 ENCOUNTER — Encounter (HOSPITAL_COMMUNITY): Payer: Medicare Other

## 2015-05-02 DIAGNOSIS — Z955 Presence of coronary angioplasty implant and graft: Secondary | ICD-10-CM | POA: Diagnosis not present

## 2015-05-05 ENCOUNTER — Encounter (HOSPITAL_COMMUNITY): Payer: Medicare Other

## 2015-05-05 ENCOUNTER — Encounter (HOSPITAL_COMMUNITY)
Admission: RE | Admit: 2015-05-05 | Discharge: 2015-05-05 | Disposition: A | Discharge status: Home / Self Care | Payer: Medicare Other | Source: Ambulatory Visit | Attending: Cardiology | Admitting: Cardiology

## 2015-05-05 DIAGNOSIS — Z955 Presence of coronary angioplasty implant and graft: Secondary | ICD-10-CM | POA: Diagnosis not present

## 2015-05-05 LAB — GLUCOSE, CAPILLARY: Glucose-Capillary: 149 mg/dL — ABNORMAL HIGH (ref 65–99)

## 2015-05-06 ENCOUNTER — Encounter: Payer: Medicare Other | Admitting: Pharmacist Clinician (PhC)/ Clinical Pharmacy Specialist

## 2015-05-07 ENCOUNTER — Encounter (HOSPITAL_COMMUNITY): Payer: Medicare Other

## 2015-05-07 ENCOUNTER — Encounter (HOSPITAL_COMMUNITY)
Admission: RE | Admit: 2015-05-07 | Discharge: 2015-05-07 | Disposition: A | Discharge status: Home / Self Care | Payer: Medicare Other | Source: Ambulatory Visit | Attending: Cardiology | Admitting: Cardiology

## 2015-05-07 DIAGNOSIS — Z955 Presence of coronary angioplasty implant and graft: Secondary | ICD-10-CM | POA: Diagnosis not present

## 2015-05-09 ENCOUNTER — Telehealth: Payer: Self-pay | Admitting: Cardiology

## 2015-05-09 ENCOUNTER — Encounter (HOSPITAL_COMMUNITY)
Admission: RE | Admit: 2015-05-09 | Discharge: 2015-05-09 | Disposition: A | Discharge status: Home / Self Care | Payer: Medicare Other | Source: Ambulatory Visit | Attending: Cardiology | Admitting: Cardiology

## 2015-05-09 ENCOUNTER — Ambulatory Visit (INDEPENDENT_AMBULATORY_CARE_PROVIDER_SITE_OTHER): Payer: Medicare Other | Admitting: Pharmacist Clinician (PhC)/ Clinical Pharmacy Specialist

## 2015-05-09 ENCOUNTER — Encounter (HOSPITAL_COMMUNITY): Payer: Medicare Other

## 2015-05-09 VITALS — BP 138/72 | HR 84

## 2015-05-09 DIAGNOSIS — Z7901 Long term (current) use of anticoagulants: Secondary | ICD-10-CM | POA: Diagnosis not present

## 2015-05-09 DIAGNOSIS — I4891 Unspecified atrial fibrillation: Secondary | ICD-10-CM

## 2015-05-09 DIAGNOSIS — Z5181 Encounter for therapeutic drug level monitoring: Secondary | ICD-10-CM | POA: Diagnosis not present

## 2015-05-09 DIAGNOSIS — I2510 Atherosclerotic heart disease of native coronary artery without angina pectoris: Secondary | ICD-10-CM

## 2015-05-09 DIAGNOSIS — I10 Essential (primary) hypertension: Secondary | ICD-10-CM

## 2015-05-09 DIAGNOSIS — Z955 Presence of coronary angioplasty implant and graft: Secondary | ICD-10-CM | POA: Diagnosis not present

## 2015-05-09 LAB — POCT INR: INR: 3.4

## 2015-05-09 NOTE — Telephone Encounter (Signed)
Patient seen in coumadin clinic.  See anticoag note

## 2015-05-09 NOTE — Telephone Encounter (Signed)
New Message   Please call. Pt has a pebble of blood in his eye will he need a coumadin check. Please call

## 2015-05-09 NOTE — Progress Notes (Signed)
Patient noted to have a small amount of blood on the inner left part of  His eye. [name] denies blurred vision or pain. Vin Bhagat PA called and notified. Patient instructed to go Dr Doug Sou office to get his INR checked per [facility] after class today. [name] exercised at cardiac rehab at a reduced level today Will continue to monitor the patient throughout  the program.

## 2015-05-09 NOTE — Telephone Encounter (Signed)
Will forward to pharm-md. 

## 2015-05-12 ENCOUNTER — Encounter (HOSPITAL_COMMUNITY): Payer: Medicare Other

## 2015-05-12 ENCOUNTER — Encounter (HOSPITAL_COMMUNITY)
Admission: RE | Admit: 2015-05-12 | Discharge: 2015-05-12 | Disposition: A | Discharge status: Home / Self Care | Payer: Medicare Other | Source: Ambulatory Visit | Attending: Cardiology | Admitting: Cardiology

## 2015-05-12 DIAGNOSIS — Z955 Presence of coronary angioplasty implant and graft: Secondary | ICD-10-CM | POA: Diagnosis not present

## 2015-05-14 ENCOUNTER — Encounter (HOSPITAL_COMMUNITY): Payer: Medicare Other

## 2015-05-14 ENCOUNTER — Encounter (HOSPITAL_COMMUNITY)
Admission: RE | Admit: 2015-05-14 | Discharge: 2015-05-14 | Disposition: A | Discharge status: Home / Self Care | Payer: Medicare Other | Source: Ambulatory Visit | Attending: Cardiology | Admitting: Cardiology

## 2015-05-14 DIAGNOSIS — Z955 Presence of coronary angioplasty implant and graft: Secondary | ICD-10-CM | POA: Diagnosis not present

## 2015-05-14 LAB — GLUCOSE, CAPILLARY: Glucose-Capillary: 106 mg/dL — ABNORMAL HIGH (ref 65–99)

## 2015-05-15 DIAGNOSIS — H40013 Open angle with borderline findings, low risk, bilateral: Secondary | ICD-10-CM | POA: Diagnosis not present

## 2015-05-16 ENCOUNTER — Encounter (HOSPITAL_COMMUNITY)
Admission: RE | Admit: 2015-05-16 | Discharge: 2015-05-16 | Disposition: A | Discharge status: Home / Self Care | Payer: Medicare Other | Source: Ambulatory Visit | Attending: Cardiology | Admitting: Cardiology

## 2015-05-16 ENCOUNTER — Encounter (HOSPITAL_COMMUNITY): Payer: Medicare Other

## 2015-05-16 DIAGNOSIS — Z955 Presence of coronary angioplasty implant and graft: Secondary | ICD-10-CM | POA: Diagnosis not present

## 2015-05-16 LAB — GLUCOSE, CAPILLARY: Glucose-Capillary: 133 mg/dL — ABNORMAL HIGH (ref 65–99)

## 2015-05-19 ENCOUNTER — Encounter (HOSPITAL_COMMUNITY): Payer: Medicare Other

## 2015-05-19 ENCOUNTER — Encounter (HOSPITAL_COMMUNITY)
Admission: RE | Admit: 2015-05-19 | Discharge: 2015-05-19 | Disposition: A | Discharge status: Home / Self Care | Payer: Medicare Other | Source: Ambulatory Visit | Attending: Cardiology | Admitting: Cardiology

## 2015-05-19 DIAGNOSIS — Z955 Presence of coronary angioplasty implant and graft: Secondary | ICD-10-CM | POA: Diagnosis not present

## 2015-05-21 ENCOUNTER — Encounter (HOSPITAL_COMMUNITY): Payer: Medicare Other

## 2015-05-21 ENCOUNTER — Telehealth (HOSPITAL_COMMUNITY): Payer: Self-pay | Admitting: Family Medicine

## 2015-05-23 ENCOUNTER — Encounter (HOSPITAL_COMMUNITY): Payer: Medicare Other

## 2015-05-23 ENCOUNTER — Encounter (HOSPITAL_COMMUNITY)
Admission: RE | Admit: 2015-05-23 | Discharge: 2015-05-23 | Disposition: A | Discharge status: Home / Self Care | Payer: Medicare Other | Source: Ambulatory Visit | Attending: Cardiology | Admitting: Cardiology

## 2015-05-23 DIAGNOSIS — Z955 Presence of coronary angioplasty implant and graft: Secondary | ICD-10-CM | POA: Diagnosis not present

## 2015-05-26 ENCOUNTER — Encounter (HOSPITAL_COMMUNITY)
Admission: RE | Admit: 2015-05-26 | Discharge: 2015-05-26 | Disposition: A | Discharge status: Home / Self Care | Payer: Medicare Other | Source: Ambulatory Visit | Attending: Cardiology | Admitting: Cardiology

## 2015-05-26 ENCOUNTER — Encounter (HOSPITAL_COMMUNITY): Payer: Medicare Other

## 2015-05-26 DIAGNOSIS — Z955 Presence of coronary angioplasty implant and graft: Secondary | ICD-10-CM | POA: Diagnosis not present

## 2015-05-28 ENCOUNTER — Encounter (HOSPITAL_COMMUNITY)
Admission: RE | Admit: 2015-05-28 | Discharge: 2015-05-28 | Disposition: A | Discharge status: Home / Self Care | Payer: Medicare Other | Source: Ambulatory Visit | Attending: Cardiology | Admitting: Cardiology

## 2015-05-28 ENCOUNTER — Encounter (HOSPITAL_COMMUNITY): Payer: Medicare Other

## 2015-05-28 DIAGNOSIS — Z955 Presence of coronary angioplasty implant and graft: Secondary | ICD-10-CM | POA: Diagnosis not present

## 2015-05-29 ENCOUNTER — Ambulatory Visit (INDEPENDENT_AMBULATORY_CARE_PROVIDER_SITE_OTHER): Payer: Medicare Other | Admitting: Pharmacist Clinician (PhC)/ Clinical Pharmacy Specialist

## 2015-05-29 DIAGNOSIS — I10 Essential (primary) hypertension: Secondary | ICD-10-CM

## 2015-05-29 DIAGNOSIS — Z7901 Long term (current) use of anticoagulants: Secondary | ICD-10-CM | POA: Diagnosis not present

## 2015-05-29 DIAGNOSIS — I2510 Atherosclerotic heart disease of native coronary artery without angina pectoris: Secondary | ICD-10-CM | POA: Diagnosis not present

## 2015-05-29 DIAGNOSIS — I4891 Unspecified atrial fibrillation: Secondary | ICD-10-CM

## 2015-05-29 DIAGNOSIS — Z5181 Encounter for therapeutic drug level monitoring: Secondary | ICD-10-CM | POA: Diagnosis not present

## 2015-05-29 LAB — POCT INR: INR: 2.8

## 2015-05-30 ENCOUNTER — Encounter (HOSPITAL_COMMUNITY)
Admission: RE | Admit: 2015-05-30 | Discharge: 2015-05-30 | Disposition: A | Discharge status: Home / Self Care | Payer: Medicare Other | Source: Ambulatory Visit | Attending: Cardiology | Admitting: Cardiology

## 2015-05-30 ENCOUNTER — Encounter (HOSPITAL_COMMUNITY): Payer: Medicare Other

## 2015-05-30 DIAGNOSIS — Z955 Presence of coronary angioplasty implant and graft: Secondary | ICD-10-CM | POA: Diagnosis not present

## 2015-06-02 ENCOUNTER — Encounter (HOSPITAL_COMMUNITY)
Admission: RE | Admit: 2015-06-02 | Discharge: 2015-06-02 | Disposition: A | Discharge status: Home / Self Care | Payer: Medicare Other | Source: Ambulatory Visit | Attending: Cardiology | Admitting: Cardiology

## 2015-06-02 ENCOUNTER — Encounter (HOSPITAL_COMMUNITY): Payer: Medicare Other

## 2015-06-02 DIAGNOSIS — Z955 Presence of coronary angioplasty implant and graft: Secondary | ICD-10-CM | POA: Insufficient documentation

## 2015-06-04 ENCOUNTER — Encounter (HOSPITAL_COMMUNITY)
Admission: RE | Admit: 2015-06-04 | Discharge: 2015-06-04 | Disposition: A | Discharge status: Home / Self Care | Payer: Medicare Other | Source: Ambulatory Visit | Attending: Cardiology | Admitting: Cardiology

## 2015-06-04 DIAGNOSIS — Z955 Presence of coronary angioplasty implant and graft: Secondary | ICD-10-CM | POA: Diagnosis not present

## 2015-06-04 LAB — GLUCOSE, CAPILLARY: Glucose-Capillary: 184 mg/dL — ABNORMAL HIGH (ref 65–99)

## 2015-06-05 ENCOUNTER — Encounter: Payer: Self-pay | Admitting: Podiatry

## 2015-06-05 ENCOUNTER — Ambulatory Visit (INDEPENDENT_AMBULATORY_CARE_PROVIDER_SITE_OTHER): Payer: Medicare Other | Admitting: Podiatry

## 2015-06-05 DIAGNOSIS — M79675 Pain in left toe(s): Secondary | ICD-10-CM | POA: Diagnosis not present

## 2015-06-05 DIAGNOSIS — B351 Tinea unguium: Secondary | ICD-10-CM | POA: Diagnosis not present

## 2015-06-05 DIAGNOSIS — E119 Type 2 diabetes mellitus without complications: Secondary | ICD-10-CM

## 2015-06-05 DIAGNOSIS — M79676 Pain in unspecified toe(s): Secondary | ICD-10-CM | POA: Diagnosis not present

## 2015-06-05 NOTE — Progress Notes (Signed)
Patient ID: [name], male   DOB: [birth year], 82 y.o.   MRN: [id number] Complaint:  Visit Type: Patient returns to my office for continued preventative foot care services. Complaint: Patient states" my nails have grown long and thick and become painful to walk and wear shoes" Patient has been diagnosed with DM with no foot  complications. He presents for preventative foot care services. No changes to ROS  Podiatric Exam: Vascular: dorsalis pedis and posterior tibial pulses are palpable bilateral. Capillary return is immediate. Temperature gradient is WNL. Skin turgor WNL  Sensorium: Normal Semmes Weinstein monofilament test. Normal tactile sensation bilaterally. Nail Exam: Pt has thick disfigured discolored nails with subungual debris noted bilateral entire nail hallux through fifth toenails Ulcer Exam: There is no evidence of ulcer or pre-ulcerative changes or infection. Orthopedic Exam: Muscle tone and strength are WNL. No limitations in general ROM. No crepitus or effusions noted. Foot type and digits show no abnormalities. Bony prominences are unremarkable. Skin: No Porokeratosis. No infection or ulcers  Diagnosis:  Tinea unguium, Pain in right toe, pain in left toes  Treatment & Plan Procedures and Treatment: Consent by patient was obtained for treatment procedures. The patient understood the discussion of treatment and procedures well. All questions were answered thoroughly reviewed. Debridement of mycotic and hypertrophic toenails, 1 through 5 bilateral and clearing of subungual debris. No ulceration, no infection noted.  Return Visit-Office Procedure: Patient instructed to return to the office for a follow up visit 3 months for continued evaluation and treatment.

## 2015-06-06 ENCOUNTER — Encounter (HOSPITAL_COMMUNITY)
Admission: RE | Admit: 2015-06-06 | Discharge: 2015-06-06 | Disposition: A | Discharge status: Home / Self Care | Payer: Medicare Other | Source: Ambulatory Visit | Attending: Cardiology | Admitting: Cardiology

## 2015-06-06 DIAGNOSIS — Z955 Presence of coronary angioplasty implant and graft: Secondary | ICD-10-CM | POA: Diagnosis not present

## 2015-06-09 ENCOUNTER — Encounter (HOSPITAL_COMMUNITY)
Admission: RE | Admit: 2015-06-09 | Discharge: 2015-06-09 | Disposition: A | Discharge status: Home / Self Care | Payer: Medicare Other | Source: Ambulatory Visit | Attending: Cardiology | Admitting: Cardiology

## 2015-06-09 DIAGNOSIS — Z955 Presence of coronary angioplasty implant and graft: Secondary | ICD-10-CM | POA: Diagnosis not present

## 2015-06-10 ENCOUNTER — Encounter: Payer: Medicare Other | Admitting: Pharmacist Clinician (PhC)/ Clinical Pharmacy Specialist

## 2015-06-11 ENCOUNTER — Encounter (HOSPITAL_COMMUNITY)
Admission: RE | Admit: 2015-06-11 | Discharge: 2015-06-11 | Disposition: A | Discharge status: Home / Self Care | Payer: Medicare Other | Source: Ambulatory Visit | Attending: Cardiology | Admitting: Cardiology

## 2015-06-11 DIAGNOSIS — Z955 Presence of coronary angioplasty implant and graft: Secondary | ICD-10-CM | POA: Diagnosis not present

## 2015-06-13 ENCOUNTER — Telehealth: Payer: Self-pay | Admitting: Cardiology

## 2015-06-13 ENCOUNTER — Encounter (HOSPITAL_COMMUNITY)
Admission: RE | Admit: 2015-06-13 | Discharge: 2015-06-13 | Disposition: A | Discharge status: Home / Self Care | Payer: Medicare Other | Source: Ambulatory Visit | Attending: Cardiology | Admitting: Cardiology

## 2015-06-13 DIAGNOSIS — Z955 Presence of coronary angioplasty implant and graft: Secondary | ICD-10-CM | POA: Diagnosis not present

## 2015-06-13 NOTE — Progress Notes (Addendum)
[name]'s heart rate has been in the low 90's to low 100 today resting, chronic atrial fibrillation. Blood pressures have been stable. Medications reviewed [name] has been taking his medications as prescribed. Will notify Dr Martinique and send ECG Tracings from cardiac rehab for review with exercise flow sheets from cardiac rehab.

## 2015-06-13 NOTE — Telephone Encounter (Signed)
Error

## 2015-06-16 ENCOUNTER — Encounter: Payer: Self-pay | Admitting: Cardiology

## 2015-06-16 ENCOUNTER — Encounter (HOSPITAL_COMMUNITY)
Admission: RE | Admit: 2015-06-16 | Discharge: 2015-06-16 | Disposition: A | Discharge status: Home / Self Care | Payer: Medicare Other | Source: Ambulatory Visit | Attending: Cardiology | Admitting: Cardiology

## 2015-06-16 ENCOUNTER — Ambulatory Visit (INDEPENDENT_AMBULATORY_CARE_PROVIDER_SITE_OTHER): Payer: Medicare Other | Admitting: Cardiology

## 2015-06-16 VITALS — BP 145/76 | HR 64 | Ht 66.0 in | Wt 218.8 lb

## 2015-06-16 DIAGNOSIS — Z9861 Coronary angioplasty status: Secondary | ICD-10-CM

## 2015-06-16 DIAGNOSIS — I2510 Atherosclerotic heart disease of native coronary artery without angina pectoris: Secondary | ICD-10-CM | POA: Diagnosis not present

## 2015-06-16 DIAGNOSIS — Z7901 Long term (current) use of anticoagulants: Secondary | ICD-10-CM

## 2015-06-16 DIAGNOSIS — H8110 Benign paroxysmal vertigo, unspecified ear: Secondary | ICD-10-CM

## 2015-06-16 DIAGNOSIS — E785 Hyperlipidemia, unspecified: Secondary | ICD-10-CM

## 2015-06-16 DIAGNOSIS — I482 Chronic atrial fibrillation, unspecified: Secondary | ICD-10-CM

## 2015-06-16 DIAGNOSIS — Z951 Presence of aortocoronary bypass graft: Secondary | ICD-10-CM | POA: Diagnosis not present

## 2015-06-16 DIAGNOSIS — Z955 Presence of coronary angioplasty implant and graft: Secondary | ICD-10-CM | POA: Diagnosis not present

## 2015-06-16 DIAGNOSIS — I10 Essential (primary) hypertension: Secondary | ICD-10-CM

## 2015-06-16 MED ORDER — METOPROLOL SUCCINATE ER 100 MG PO TB24: 100.0000 mg | ORAL_TABLET | Freq: Two times a day (BID) | ORAL | Status: DC

## 2015-06-16 NOTE — Progress Notes (Signed)
[name] Date of Birth: 1928-06-10   History of Present Illness: [name] is seen for followup atrial fibrillation and CAD.  He  Has a history of chronic atrial fibrillation managed with rate control and anticoagulation with coumadin. He also has a history of CAD s/p CABG in 1990. Echo in 2013 showed normal LV function. Very mild AS.  biatrial enlargement. In October 2016 he had an abnormal [place] which showed inferior wall ischemia. He underwent cardiac cath. This showed occlusion of the proximal LAD and a critical stenosis of the mid RCA. The SVG to diagonal was occluded. The LIMA to the LAD was patent. He had stenting of the mid RCA with a DES. He is on Plavix and Coumadin.   In January 2017 he had a severe nosebleed. He had packing but it did not stop. Subsequently had cautery by Dr. Redmond Baseman. No recurrent bleeding since then.   On follow up today he is feeling well.  He denies any significant palpitations,  chest pain, or shortness of breath. At Cardiac Rehab his resting pulse has been 95 and goes up to 150 with exercise. This despite an increase in Toprol on his last visit. He is still asymptomatic. He will complete cardiac Rehab on Friday.  Current Outpatient Prescriptions on File Prior to Visit  Medication Sig Dispense Refill  . AVODART 0.5 MG capsule Take 0.5 mg by mouth every other day.     . clopidogrel (PLAVIX) 75 MG tablet Take 1 tablet (75 mg total) by mouth daily with breakfast. 90 tablet 3  . diltiazem (CARDIZEM CD) 360 MG 24 hr capsule Take 1 capsule (360 mg total) by mouth daily. 30 capsule 6  . glucose blood (PRECISION XTRA TEST STRIPS) test strip 1 each by Other route See admin instructions. Check blood sugar once daily    . insulin glargine (LANTUS) 100 UNIT/ML injection Inject 32 Units into the skin daily.     [name] lisinopril (PRINIVIL,ZESTRIL) 5 MG tablet Take 1 tablet (5 mg total) by mouth daily. 30 tablet 6  . nitroGLYCERIN (NITROSTAT) 0.4 MG SL tablet  Place 0.4 mg under the tongue every 5 (five) minutes as needed for chest pain (x 3 doses).    . pantoprazole (PROTONIX) 40 MG tablet Take 1 tablet (40 mg total) by mouth daily at 12 noon. 90 tablet 3  . simvastatin (ZOCOR) 80 MG tablet Take 40 mg by mouth at bedtime.     . Tamsulosin HCl (FLOMAX) 0.4 MG CAPS Take 0.4 mg by mouth every other day.     . warfarin (COUMADIN) 5 MG tablet Take 1 to 1.5 tablets by mouth daily as directed by coumadin clinic 120 tablet 1   No current facility-administered medications on file prior to visit.    Allergies  Allergen Reactions  . Amoxicillin Other (See Comments)    Unknown  . Cefadroxil Other (See Comments)    Unknown  . Ciprofloxacin Other (See Comments)    Unknown  . Erythromycin Other (See Comments)    Upset stomach    Past Medical History  Diagnosis Date  . Mild aortic stenosis   . HTN (hypertension)   . Hyperlipemia   . Bladder outlet obstruction   . BPH (benign prostatic hyperplasia)   . Kidney stones   . Abdominal pain   . Constipation   . Bladder outlet obstruction   . Acute renal failure ([place])      resolved  . Urinary tract infection  Enterococcus  . Rib fractures     left rib fractures being treated with pain medications  . CAD (coronary artery disease)     a. CABG IN 1989. b. 01/08/2015 CTO of ost LAD, LIMA to LAD not visualized but assumed patent given myoview finding, occluded SVG to diagonal, 99% mid RCA tx w/ SYNERGY DES 3X28 mm  . Diabetes mellitus type 2, insulin dependent ([place])   . Atrial fibrillation ([place])   . GERD (gastroesophageal reflux disease)   . Arthritis     "hands & legs" (11/'10/2014)  . Stented coronary artery Nov 2016    RCA DES  . Epistaxis, recurrent Feb 2017    Past Surgical History  Procedure Laterality Date  . Coronary artery bypass graft  1989    "CABG X 2"  . Total hip arthroplasty Right 2000  . Cardiac catheterization  1989  . Carpal tunnel release Right 08/2010  . Cataract  extraction w/ intraocular lens  implant, bilateral Bilateral   . Joint replacement    . Cardiac catheterization N/A 01/08/2015    Procedure: Left Heart Cath and Cors/Grafts Angiography;  Surgeon: Maiya Kates M Martinique, MD;  Location: [facility];  Service: Cardiovascular;  Laterality: N/A;  . Cardiac catheterization  01/08/2015    Procedure: Coronary Stent Intervention;  Surgeon: Marcellius Montagna M Martinique, MD;  Location: [facility];  Service: Cardiovascular;;  . Tonsillectomy    . Coronary angioplasty  01/08/15    RCA DES    History  Smoking status  . Former Smoker -- 3.00 packs/day for 10 years  . Types: Cigarettes  . Quit date: 05/11/1958  Smokeless tobacco  . Never Used    History  Alcohol Use No    Family History  Problem Relation Age of Onset  . Brain cancer Father   . Throat cancer Brother     Review of Systems: As noted in history of present illness.  All other systems were reviewed and are negative.  Physical Exam: BP 145/76 mmHg  Pulse 64  Ht 5\' 6"  (1.676 m)  Wt 99.247 kg (218 lb 12.8 oz)  BMI 35.33 kg/m2 He is a pleasant elderly white male who is obese. HEENT exam is unremarkable. Sclera are clear. PERRLA. Oropharynx is clear. Neck is supple no JVD, adenopathy, thyromegaly, or bruits. Lungs are clear. Cardiac exam reveals a grade 1/6 systolic ejection murmur right upper sternal border. His pulse is irregular. Old median sternotomy scar. Abdomen is obese, soft, nontender. He has no edema. Pedal pulses are palpable. He is alert and oriented x3. Cranial nerves II through XII are intact. Skin is warm and dry.  LABORATORY DATA:  Lab Results  Component Value Date   WBC 9.9 03/29/2015   HGB 12.2* 03/29/2015   HCT 35.3* 03/29/2015   PLT 272 03/29/2015   GLUCOSE 211* 03/29/2015   ALT 25 11/28/2010   AST 21 11/28/2010   NA 140 03/29/2015   K 3.8 03/29/2015   CL 102 03/29/2015   CREATININE 1.09 03/29/2015   BUN 15 03/29/2015   CO2 27 03/29/2015   PSA 106.83 Test  Methodology: [employer] PSA* 11/09/2009   INR 2.8 05/29/2015   HGBA1C 8.2* 11/28/2010      Assessment / Plan: 1. Chronic atrial fibrillation. Rate control is still not optimal based on performance at Cardiac rehab. Will increase Toprol XL to 100 mg bid.   On coumadin. Asymptomatic. Continue Cardizem.   2. CAD s/p remote CABG 25 years ago. S/p DES of RCA  01/08/15. Continue Plavix for at least one year.   3. Chronic anticoagulation- therapeutic.  4. HTN controlled.  5. DM type 2- on insulin  6. Hyperlipidemia.  7. Epistaxis. Improved post cautery. No recurrence.    I will follow up in 3 months.

## 2015-06-16 NOTE — Patient Instructions (Signed)
Increase your Toprol to 100 mg bid.  Continue your other therapy  I will see you in 3 months

## 2015-06-18 ENCOUNTER — Encounter (HOSPITAL_COMMUNITY)
Admission: RE | Admit: 2015-06-18 | Discharge: 2015-06-18 | Disposition: A | Discharge status: Home / Self Care | Payer: Medicare Other | Source: Ambulatory Visit | Attending: Cardiology | Admitting: Cardiology

## 2015-06-18 DIAGNOSIS — Z955 Presence of coronary angioplasty implant and graft: Secondary | ICD-10-CM | POA: Diagnosis not present

## 2015-06-18 NOTE — Progress Notes (Signed)
Pt graduates from cardiac rehab program Friday with completion of 36 exercise sessions in Phase II. Pt maintained good attendance and progressed nicely during his participation in rehab as evidenced by increased MET level.   Medication list reconciled. Repeat  PHQ score-0  .  Pt has made significant lifestyle changes and should be commended for his success. Pt feels he has achieved his goals during cardiac rehab.   Pt plans to continue exercise at the [facility] with his wife

## 2015-06-20 ENCOUNTER — Encounter (HOSPITAL_COMMUNITY)
Admission: RE | Admit: 2015-06-20 | Discharge: 2015-06-20 | Disposition: A | Discharge status: Home / Self Care | Payer: Medicare Other | Source: Ambulatory Visit | Attending: Cardiology | Admitting: Cardiology

## 2015-06-20 DIAGNOSIS — Z955 Presence of coronary angioplasty implant and graft: Secondary | ICD-10-CM | POA: Diagnosis not present

## 2015-06-23 ENCOUNTER — Encounter: Payer: Self-pay | Admitting: Rehabilitative and Restorative Service Providers"

## 2015-06-23 ENCOUNTER — Ambulatory Visit: Payer: Medicare Other | Admitting: Rehabilitative and Restorative Service Providers"

## 2015-06-23 ENCOUNTER — Ambulatory Visit: Payer: Medicare Other | Attending: Cardiology | Admitting: Physical Therapy

## 2015-06-23 ENCOUNTER — Encounter (HOSPITAL_COMMUNITY): Payer: Medicare Other

## 2015-06-23 DIAGNOSIS — R42 Dizziness and giddiness: Secondary | ICD-10-CM | POA: Diagnosis not present

## 2015-06-23 DIAGNOSIS — H8112 Benign paroxysmal vertigo, left ear: Secondary | ICD-10-CM | POA: Insufficient documentation

## 2015-06-23 NOTE — Patient Instructions (Signed)
Tip Card 1.The goal of habituation training is to assist in decreasing symptoms of vertigo, dizziness, or nausea provoked by specific head and body motions. 2.These exercises may initially increase symptoms; however, be persistent and work through symptoms. With repetition and time, the exercises will assist in reducing or eliminating symptoms. 3.Exercises should be stopped and discussed with the therapist if you experience any of the following: - Sudden change or fluctuation in hearing - New onset of ringing in the ears, or increase in current intensity - Any fluid discharge from the ear - Severe pain in neck or back - Extreme nausea  Copyright  VHI. All rights reserved.  Rolling   With pillow under head, start on back. Roll to your right side.  Hold until dizziness stops, plus 20 seconds and then roll to the left side.  Hold until dizziness stops, plus 20 seconds.  Repeat sequence 5 times per session. Do 2 sessions per day.       

## 2015-06-23 NOTE — Therapy (Signed)
[facility] [address] [place], [place], [address] Phone: [phone]   Fax:  [phone]  Physical Therapy Evaluation  Patient Details  Name: [name] MRN: [id number] Date of Birth: Sep 19, 1928 Referring Provider: Peter Martinique, MD  Encounter Date: 06/23/2015      PT End of Session - 06/23/15 1259    Visit Number 1   Number of Visits 5  eval + 4 visits   Date for PT Re-Evaluation 07/23/15   Authorization Type Medicare Traditional primary; BCBS secondary   Authorization Time Period G Codes required   PT Start Time 1148   PT Stop Time 1239   PT Time Calculation (min) 51 min   Activity Tolerance Patient tolerated treatment well   Behavior During Therapy [facility] for tasks assessed/performed      Past Medical History  Diagnosis Date  . Mild aortic stenosis   . HTN (hypertension)   . Hyperlipemia   . Bladder outlet obstruction   . BPH (benign prostatic hyperplasia)   . Kidney stones   . Abdominal pain   . Constipation   . Bladder outlet obstruction   . Acute renal failure ([place])      resolved  . Urinary tract infection      Enterococcus  . Rib fractures     left rib fractures being treated with pain medications  . CAD (coronary artery disease)     a. CABG IN 1989. b. 01/08/2015 CTO of ost LAD, LIMA to LAD not visualized but assumed patent given myoview finding, occluded SVG to diagonal, 99% mid RCA tx w/ SYNERGY DES 3X28 mm  . Diabetes mellitus type 2, insulin dependent ([place])   . Atrial fibrillation ([place])   . GERD (gastroesophageal reflux disease)   . Arthritis     "hands & legs" (11/'10/2014)  . Stented coronary artery Nov 2016    RCA DES  . Epistaxis, recurrent Feb 2017    Past Surgical History  Procedure Laterality Date  . Coronary artery bypass graft  1989    "CABG X 2"  . Total hip arthroplasty Right 2000  . Cardiac catheterization  1989  . Carpal tunnel release Right 08/2010  . Cataract  extraction w/ intraocular lens  implant, bilateral Bilateral   . Joint replacement    . Cardiac catheterization N/A 01/08/2015    Procedure: Left Heart Cath and Cors/Grafts Angiography;  Surgeon: Peter M Martinique, MD;  Location: [facility];  Service: Cardiovascular;  Laterality: N/A;  . Cardiac catheterization  01/08/2015    Procedure: Coronary Stent Intervention;  Surgeon: Peter M Martinique, MD;  Location: [facility];  Service: Cardiovascular;;  . Tonsillectomy    . Coronary angioplasty  01/08/15    RCA DES    There were no vitals filed for this visit.       Subjective Assessment - 06/23/15 1154    Subjective When I roll to my left, the room spins for 25-30 seconds. When I roll to the right, it happens for a few seconds.    Pertinent History Likes to be called "[name]". PMH significant for: HTN, HLD, CAD s/p CABG x2 (1990), acute renal failure, aortic stenosis, stented coronary artery   Currently in Pain? No/denies            [facility] PT Assessment - 06/23/15 0001    Assessment   Medical Diagnosis Vertigo, benign positional, unspecified laterality   Referring Provider Peter Martinique, MD   Onset Date/Surgical Date 04/30/15  Precautions   Precautions Fall   Balance Screen   Has the patient fallen in the past 6 months Yes   How many times? 1   Has the patient had a decrease in activity level because of a fear of falling?  No   Is the patient reluctant to leave their home because of a fear of falling?  No   Home Environment   Living Environment Private residence            Vestibular Assessment - 06/23/15 0001    Symptom Behavior   Type of Dizziness Spinning   Frequency of Dizziness daily   Duration of Dizziness 30 seconds or less   Aggravating Factors Rolling to left   Relieving Factors Head stationary   Occulomotor Exam   Occulomotor Alignment Normal   Spontaneous Absent   Gaze-induced Absent   Smooth Pursuits Intact   Saccades Comment  hypometric from L >  midline   Comment (+) and symptomatic B Head Thrust Tests   Vestibulo-Occular Reflex   VOR 1 Head Only (x 1 viewing) pt reports feeling "a little funny" after attempting x1 viewing with horizontal head movement   Positional Testing   Dix-Hallpike Dix-Hallpike Right;Dix-Hallpike Left   Sidelying Test Sidelying Left   Horizontal Canal Testing Horizontal Canal Right;Horizontal Canal Left;Horizontal Canal Right Intensity;Horizontal Canal Left Intensity  modified to sidelying due to limited neck ROM   Dix-Hallpike Right   Dix-Hallpike Right Duration NA   Dix-Hallpike Right Symptoms No nystagmus   Dix-Hallpike Left   Dix-Hallpike Left Duration Approx. 20 seconds of vertiginous symptoms; however, no nystagmus noted.   Dix-Hallpike Left Symptoms No nystagmus   Sidelying Right   Sidelying Right Duration na   Sidelying Right Symptoms No nystagmus   Sidelying Left   Sidelying Left Duration Approx. 20 seconds of vertiginous symptoms; however, no nystagmus visible.   Sidelying Left Symptoms No nystagmus   Horizontal Canal Right   Horizontal Canal Right Duration NA   Horizontal Canal Right Symptoms Normal   Horizontal Canal Left   Horizontal Canal Left Duration 20 seconds   Horizontal Canal Left Symptoms Geotrophic   Horizontal Canal Right Intensity   Right Intensity Comment < 5 seconds of mild vertiginous symptoms   Horizontal Canal Left Intensity   Horizontal Canal Left Intensity Moderate                Vestibular Treatment/Exercise - 06/23/15 0001    Vestibular Treatment/Exercise   Vestibular Treatment Provided Canalith Repositioning   Canalith Repositioning Canal Roll Left   Canal Roll Left   Number of Reps  1   Overall Response  Symptoms Resolved   Response Details  Reassessment of L HC reveals no symptoms, no dizziness.               PT Education - 06/23/15 1252    Education provided Yes   Education Details PT eval findings, goals, and POC. Explained nature of  vertigo and what to expect after this visit. Habituation HEP initiated.   Person(s) Educated Patient   Methods Explanation;Demonstration   Comprehension Verbalized understanding          PT Short Term Goals - 06/23/15 1307    PT SHORT TERM GOAL #1   Title STG's = LTG's           PT Long Term Goals - 06/23/15 1307    PT LONG TERM GOAL #1   Title Pt will independently perform habituation HEP to maximize functional gains  made in PT.   (Target date: 07/21/15)   PT LONG TERM GOAL #2   Title Positional vertigo testing will be negative to indicate resolved BPPV.     PT LONG TERM GOAL #3   Title Pt will decrease DHI score from 32 to < / =  14 to indicate significant decrease in pt-perceived disability due to dizziness.    PT LONG TERM GOAL #4   Title Assess strength and balance, if indicated, after vertigo clears.               Plan - 06/23/15 1303    Clinical Impression Statement Pt is an 82 y/o M referred to vestibular PT to address BPPV. PMH significant for: HTN, HLD, CAD s/p CABG x2 (1990), acute renal failure, aortic stenosis, stented coronary artery. PT evaluation reveals: (+) and symptomatic B Head thrust Tests; geotrophic nystagmus (intensity of symptoms on L > R) with horizontal canal testing. Based on findings, unable to rule out L horizontal canalithiasis and B vestibular hypofunction. Therefore, treated with L Canal Roll, after which horizontal canal testing was negativeand asymptomatic. Pt will benefit from skilled outpatient PT 1x/week for up to 4 weeks to address said impairments.    Rehab Potential Good   Clinical Impairments Affecting Rehab Potential favorable response to initial treatment   PT Frequency 1x / week   PT Duration 4 weeks   PT Treatment/Interventions ADLs/Self Care Home Management;Gait training;Stair training;Neuromuscular re-education;Functional mobility training;Therapeutic activities;Patient/family education;Therapeutic exercise;Vestibular;Canalith  Repostioning   PT Next Visit Plan Reassess for BPPV (L HC). Initiate x1 viewing and vestibular HEP.   Consulted and Agree with Plan of Care Patient      Patient will benefit from skilled therapeutic intervention in order to improve the following deficits and impairments:  Dizziness  Visit Diagnosis: BPPV (benign paroxysmal positional vertigo), left - Plan: PT plan of care cert/re-cert  Dizziness and giddiness - Plan: PT plan of care cert/re-cert      G-Codes - 0000000 1205    Functional Assessment Tool Used DHI = 32   Functional Limitation Self care   Self Care Current Status [id number]) At least 20 percent but less than 40 percent impaired, limited or restricted   Self Care Goal Status [id number]) At least 1 percent but less than 20 percent impaired, limited or restricted       Problem List Patient Active Problem List   Diagnosis Date Noted  . Epistaxis, recurrent 04/11/2015  . CAD S/P PCI- Nov 2016   . Abnormal nuclear stress test 01/08/2015  . Vertigo, benign positional 06/25/2014  . Encounter for therapeutic drug monitoring 04/13/2013  . Chronic anticoagulation 09/09/2011  . Chronic atrial fibrillation ([place]) 07/30/2011  . Hx of CABG x 2 1990   . HTN (hypertension)   . Diabetes mellitus type 2, insulin dependent ([place])   . Hyperlipemia   . BPH (benign prostatic hyperplasia)    Billie Ruddy, PT, DPT [facility] [address] [place], [place], [address] Phone: [phone]   Fax:  [phone] 06/23/2015, 1:23 PM  Name: [name] MRN: [id number] Date of Birth: 07-03-1928

## 2015-06-25 ENCOUNTER — Encounter (HOSPITAL_COMMUNITY): Payer: Medicare Other

## 2015-06-27 ENCOUNTER — Encounter (HOSPITAL_COMMUNITY): Payer: Medicare Other

## 2015-07-01 ENCOUNTER — Ambulatory Visit: Payer: Medicare Other | Attending: Cardiology | Admitting: Physical Therapy

## 2015-07-01 DIAGNOSIS — R42 Dizziness and giddiness: Secondary | ICD-10-CM | POA: Insufficient documentation

## 2015-07-01 NOTE — Therapy (Signed)
[facility] [address] [place], [place], [address] Phone: [phone]   Fax:  [phone]  Physical Therapy Treatment and Discharge Summary  Patient Details  Name: [name] MRN: [id number] Date of Birth: August 19, 1928 Referring Provider: Peter Martinique, MD  Encounter Date: 07/01/2015      PT End of Session - 07/01/15 2158    Visit Number 2   Number of Visits 5   Date for PT Re-Evaluation 07/23/15   Authorization Type Medicare Traditional primary; BCBS secondary   Authorization Time Period G Codes required   PT Start Time 0800   PT Stop Time 0841   PT Time Calculation (min) 41 min   Activity Tolerance Patient tolerated treatment well   Behavior During Therapy [facility] for tasks assessed/performed      Past Medical History  Diagnosis Date  . Mild aortic stenosis   . HTN (hypertension)   . Hyperlipemia   . Bladder outlet obstruction   . BPH (benign prostatic hyperplasia)   . Kidney stones   . Abdominal pain   . Constipation   . Bladder outlet obstruction   . Acute renal failure ([place])      resolved  . Urinary tract infection      Enterococcus  . Rib fractures     left rib fractures being treated with pain medications  . CAD (coronary artery disease)     a. CABG IN 1989. b. 01/08/2015 CTO of ost LAD, LIMA to LAD not visualized but assumed patent given myoview finding, occluded SVG to diagonal, 99% mid RCA tx w/ SYNERGY DES 3X28 mm  . Diabetes mellitus type 2, insulin dependent ([place])   . Atrial fibrillation ([place])   . GERD (gastroesophageal reflux disease)   . Arthritis     "hands & legs" (11/'10/2014)  . Stented coronary artery Nov 2016    RCA DES  . Epistaxis, recurrent Feb 2017    Past Surgical History  Procedure Laterality Date  . Coronary artery bypass graft  1989    "CABG X 2"  . Total hip arthroplasty Right 2000  . Cardiac catheterization  1989  . Carpal tunnel release Right 08/2010  . Cataract  extraction w/ intraocular lens  implant, bilateral Bilateral   . Joint replacement    . Cardiac catheterization N/A 01/08/2015    Procedure: Left Heart Cath and Cors/Grafts Angiography;  Surgeon: Peter M Martinique, MD;  Location: [place] [facility];  Service: Cardiovascular;  Laterality: N/A;  . Cardiac catheterization  01/08/2015    Procedure: Coronary Stent Intervention;  Surgeon: Peter M Martinique, MD;  Location: [place] [facility];  Service: Cardiovascular;;  . Tonsillectomy    . Coronary angioplasty  01/08/15    RCA DES    There were no vitals filed for this visit.      Subjective Assessment - 07/01/15 0800    Subjective "I haven't had any more of those spinning episodes. I had just a little bit of dizziness when doing the exercises last week, but I haven't had any since. I've been doing the exercise two times a day."   Pertinent History Likes to be called "[name]". PMH significant for: HTN, HLD, CAD s/p CABG x2 (1990), acute renal failure, aortic stenosis, stented coronary artery   Currently in Pain? No/denies            [facility] PT Assessment - 07/01/15 0001    Functional Gait  Assessment   Gait assessed  Yes   Gait Level  Surface Walks 20 ft in less than 5.5 sec, no assistive devices, good speed, no evidence for imbalance, normal gait pattern, deviates no more than 6 in outside of the 12 in walkway width.   Change in Gait Speed Able to smoothly change walking speed without loss of balance or gait deviation. Deviate no more than 6 in outside of the 12 in walkway width.   Gait with Horizontal Head Turns Performs head turns smoothly with no change in gait. Deviates no more than 6 in outside 12 in walkway width   Gait with Vertical Head Turns Performs task with slight change in gait velocity (eg, minor disruption to smooth gait path), deviates 6 - 10 in outside 12 in walkway width or uses assistive device   Gait and Pivot Turn Pivot turns safely within 3 sec and stops quickly with no loss of  balance.   Step Over Obstacle Is able to step over 2 stacked shoe boxes taped together (9 in total height) without changing gait speed. No evidence of imbalance.   Gait with Narrow Base of Support Ambulates 4-7 steps.   Gait with Eyes Closed Walks 20 ft, uses assistive device, slower speed, mild gait deviations, deviates 6-10 in outside 12 in walkway width. Ambulates 20 ft in less than 9 sec but greater than 7 sec.   Ambulating Backwards Walks 20 ft, uses assistive device, slower speed, mild gait deviations, deviates 6-10 in outside 12 in walkway width.   Steps Alternating feet, no rail.   Total Score 25   FGA comment: Score suggests lowf fall risk.            Vestibular Assessment - 07/01/15 0001    Vestibulo-Occular Reflex   VOR 1 Head Only (x 1 viewing) Pt reports no blurring of visual target and no disequilibrium, dizziness with x1 viewing with horizontal and vertical head turns.   Sidelying Left   Sidelying Left Duration NA   Sidelying Left Symptoms No nystagmus   Horizontal Canal Left   Horizontal Canal Left Duration NA   Horizontal Canal Left Symptoms Normal                 [facility] Adult PT Treatment/Exercise - 07/01/15 0001    Ambulation/Gait   Ambulation/Gait Yes   Ambulation/Gait Assistance 6: Modified independent (Device/Increase time)   Ambulation Distance (Feet) 800 Feet   Assistive device None   Gait Pattern Within Functional Limits   Ambulation Surface Level;Unlevel;Indoor;Outdoor;Paved             Balance Exercises - 07/01/15 2158    Balance Exercises: Standing   Standing Eyes Closed Narrow base of support (BOS);Head turns;Foam/compliant surface;5 reps  1 pillow           PT Education - 07/01/15 2156    Education provided Yes   Education Details PT goals, findings, progress, and DC plan. Expanded on HEP.    Methods Explanation;Demonstration;Verbal cues;Handout   Comprehension Verbalized understanding;Returned demonstration           PT Short Term Goals - 06/23/15 1307    PT SHORT TERM GOAL #1   Title STG's = LTG's           PT Long Term Goals - 07/01/15 0819    PT LONG TERM GOAL #1   Title Pt will independently perform habituation HEP to maximize functional gains made in PT.   (Target date: 07/21/15)   Baseline Met 5/2.   Status Achieved   PT LONG TERM GOAL #2  Title Positional vertigo testing will be negative to indicate resolved BPPV.     Baseline Met 5/2.   Status Achieved   PT LONG TERM GOAL #3   Title Pt will decrease DHI score from 32 to < / =  14 to indicate significant decrease in pt-perceived disability due to dizziness.    Baseline 5/2: DHI = 10   Status Achieved   PT LONG TERM GOAL #4   Title Assess strength and balance, if indicated, after vertigo clears.   Baseline Met 5/2, with no significant impairments.   Status Achieved               Plan - 07/28/2015 2159    Clinical Impression Statement Vertigo cleared and gait/balance assessed. FGA = 25/30, suggesting low fall risk. Pt feels as though mobility and balance are at baseline. Pt will therefore be discharged from vestibular PT at this time. Pt in full agreement with DC plan.    Consulted and Agree with Plan of Care Patient      Patient will benefit from skilled therapeutic intervention in order to improve the following deficits and impairments:     Visit Diagnosis: Dizziness and giddiness       G-Codes - Jul 28, 2015 0844    Functional Assessment Tool Used DHI = 10   Functional Limitation Self care   Self Care Goal Status (K3491) At least 1 percent but less than 20 percent impaired, limited or restricted   Self Care Discharge Status [phone]) At least 1 percent but less than 20 percent impaired, limited or restricted      Problem List Patient Active Problem List   Diagnosis Date Noted  . Epistaxis, recurrent 04/11/2015  . CAD S/P PCI- Nov 2016   . Abnormal nuclear stress test 01/08/2015  . Vertigo, benign positional  06/25/2014  . Encounter for therapeutic drug monitoring 04/13/2013  . Chronic anticoagulation 09/09/2011  . Chronic atrial fibrillation ([place]) 07/30/2011  . Hx of CABG x 2 1990   . HTN (hypertension)   . Diabetes mellitus type 2, insulin dependent ([place])   . Hyperlipemia   . BPH (benign prostatic hyperplasia)     PHYSICAL THERAPY DISCHARGE SUMMARY  Visits from Start of Care: 2  Current functional level related to goals / functional outcomes: See above goals.   Remaining deficits: Pt demonstrates minimal gait instability (intermittently catches toe but able to self-recover) with horizontal head turns on unlevel, outdoor surfaces. Provided HEP to address this impairment.   Education / Equipment: Habituation and vestibular HEP. Plan: Patient agrees to discharge.  Patient goals were met. Patient is being discharged due to meeting the stated rehab goals.  ?????        ame: [name] MRN: [id number] Date of Birth: 1928/05/10   Billie Ruddy, PT, DPT [facility] [address] [place], [place], [address] Phone: [phone]   Fax:  [phone] Jul 28, 2015, 10:02 PM

## 2015-07-01 NOTE — Patient Instructions (Signed)
[name], - If you get vertigo in the future, try the rolling exercise we practiced at the first session. If this doesn't work, call your doctor.  - If you get vertigo/dizziness at rest that doesn't change with head/body movement (like getting into/out of bed), seek immediate medical attention.    Feet Together (Compliant Surface) Head Motion - Eyes Closed    Stand with your back to a corner with a stable chair in front of you. Stand on 1 pillow with feet together. Close eyes and move head slowly: up and down 5 times; and from right to left 5 times.  Repeat __2-3__ times per day.  Walking Head Turn    Standing close to a wall in your long hallway, walk the length of your hallway while turning head side to side (two steps with head to the right; two steps with head to the left). Touch wall if necessary to keep balance. Walk the length of your hallway in this way 5 times (down and back) per day.

## 2015-07-07 DIAGNOSIS — I10 Essential (primary) hypertension: Secondary | ICD-10-CM | POA: Diagnosis not present

## 2015-07-07 DIAGNOSIS — I2510 Atherosclerotic heart disease of native coronary artery without angina pectoris: Secondary | ICD-10-CM | POA: Diagnosis not present

## 2015-07-07 DIAGNOSIS — E1159 Type 2 diabetes mellitus with other circulatory complications: Secondary | ICD-10-CM | POA: Diagnosis not present

## 2015-07-07 DIAGNOSIS — Z7901 Long term (current) use of anticoagulants: Secondary | ICD-10-CM | POA: Diagnosis not present

## 2015-07-07 DIAGNOSIS — M75111 Incomplete rotator cuff tear or rupture of right shoulder, not specified as traumatic: Secondary | ICD-10-CM | POA: Diagnosis not present

## 2015-07-07 DIAGNOSIS — Z794 Long term (current) use of insulin: Secondary | ICD-10-CM | POA: Diagnosis not present

## 2015-07-08 ENCOUNTER — Encounter: Payer: Medicare Other | Admitting: Physical Therapy

## 2015-07-10 ENCOUNTER — Ambulatory Visit (INDEPENDENT_AMBULATORY_CARE_PROVIDER_SITE_OTHER): Payer: Medicare Other | Admitting: Pharmacist

## 2015-07-10 DIAGNOSIS — Z7901 Long term (current) use of anticoagulants: Secondary | ICD-10-CM

## 2015-07-10 DIAGNOSIS — Z5181 Encounter for therapeutic drug level monitoring: Secondary | ICD-10-CM | POA: Diagnosis not present

## 2015-07-10 DIAGNOSIS — I2510 Atherosclerotic heart disease of native coronary artery without angina pectoris: Secondary | ICD-10-CM

## 2015-07-10 DIAGNOSIS — I10 Essential (primary) hypertension: Secondary | ICD-10-CM | POA: Diagnosis not present

## 2015-07-10 DIAGNOSIS — I4891 Unspecified atrial fibrillation: Secondary | ICD-10-CM

## 2015-07-10 LAB — POCT INR: INR: 3

## 2015-07-15 ENCOUNTER — Encounter: Payer: Medicare Other | Admitting: Physical Therapy

## 2015-07-23 ENCOUNTER — Encounter: Payer: Medicare Other | Admitting: Rehabilitative and Restorative Service Providers"

## 2015-07-31 ENCOUNTER — Ambulatory Visit: Payer: Medicare Other | Admitting: Cardiology

## 2015-08-10 ENCOUNTER — Other Ambulatory Visit: Payer: Self-pay | Admitting: Physician Assistant

## 2015-08-11 NOTE — Telephone Encounter (Signed)
Rx request sent to pharmacy.  

## 2015-08-18 DIAGNOSIS — E1159 Type 2 diabetes mellitus with other circulatory complications: Secondary | ICD-10-CM | POA: Diagnosis not present

## 2015-08-18 DIAGNOSIS — I10 Essential (primary) hypertension: Secondary | ICD-10-CM | POA: Diagnosis not present

## 2015-08-18 DIAGNOSIS — I2510 Atherosclerotic heart disease of native coronary artery without angina pectoris: Secondary | ICD-10-CM | POA: Diagnosis not present

## 2015-08-18 DIAGNOSIS — E1169 Type 2 diabetes mellitus with other specified complication: Secondary | ICD-10-CM | POA: Diagnosis not present

## 2015-08-18 DIAGNOSIS — Z794 Long term (current) use of insulin: Secondary | ICD-10-CM | POA: Diagnosis not present

## 2015-08-18 DIAGNOSIS — E782 Mixed hyperlipidemia: Secondary | ICD-10-CM | POA: Diagnosis not present

## 2015-08-18 DIAGNOSIS — Z7901 Long term (current) use of anticoagulants: Secondary | ICD-10-CM | POA: Diagnosis not present

## 2015-08-18 DIAGNOSIS — I482 Chronic atrial fibrillation: Secondary | ICD-10-CM | POA: Diagnosis not present

## 2015-08-21 ENCOUNTER — Ambulatory Visit (INDEPENDENT_AMBULATORY_CARE_PROVIDER_SITE_OTHER): Payer: Medicare Other | Admitting: Pharmacist Clinician (PhC)/ Clinical Pharmacy Specialist

## 2015-08-21 DIAGNOSIS — I10 Essential (primary) hypertension: Secondary | ICD-10-CM | POA: Diagnosis not present

## 2015-08-21 DIAGNOSIS — Z7901 Long term (current) use of anticoagulants: Secondary | ICD-10-CM | POA: Diagnosis not present

## 2015-08-21 DIAGNOSIS — I4891 Unspecified atrial fibrillation: Secondary | ICD-10-CM | POA: Diagnosis not present

## 2015-08-21 DIAGNOSIS — Z5181 Encounter for therapeutic drug level monitoring: Secondary | ICD-10-CM

## 2015-08-21 DIAGNOSIS — I2510 Atherosclerotic heart disease of native coronary artery without angina pectoris: Secondary | ICD-10-CM

## 2015-08-21 LAB — POCT INR: INR: 3.1

## 2015-08-26 DIAGNOSIS — N401 Enlarged prostate with lower urinary tract symptoms: Secondary | ICD-10-CM | POA: Diagnosis not present

## 2015-08-26 DIAGNOSIS — R351 Nocturia: Secondary | ICD-10-CM | POA: Diagnosis not present

## 2015-08-28 ENCOUNTER — Ambulatory Visit (INDEPENDENT_AMBULATORY_CARE_PROVIDER_SITE_OTHER): Payer: Medicare Other | Admitting: Podiatry

## 2015-08-28 ENCOUNTER — Encounter: Payer: Self-pay | Admitting: Podiatry

## 2015-08-28 DIAGNOSIS — M79676 Pain in unspecified toe(s): Secondary | ICD-10-CM | POA: Diagnosis not present

## 2015-08-28 DIAGNOSIS — E119 Type 2 diabetes mellitus without complications: Secondary | ICD-10-CM

## 2015-08-28 DIAGNOSIS — B351 Tinea unguium: Secondary | ICD-10-CM

## 2015-08-28 DIAGNOSIS — M79675 Pain in left toe(s): Secondary | ICD-10-CM

## 2015-08-28 NOTE — Progress Notes (Signed)
Patient ID: [name], male   DOB: [birth year], 82 y.o.   MRN: [id number] Complaint:  Visit Type: Patient returns to my office for continued preventative foot care services. Complaint: Patient states" my nails have grown long and thick and become painful to walk and wear shoes" Patient has been diagnosed with DM with no foot  complications. He presents for preventative foot care services. No changes to ROS  Podiatric Exam: Vascular: dorsalis pedis and posterior tibial pulses are palpable bilateral. Capillary return is immediate. Temperature gradient is WNL. Skin turgor WNL  Sensorium: Normal Semmes Weinstein monofilament test. Normal tactile sensation bilaterally. Nail Exam: Pt has thick disfigured discolored nails with subungual debris noted bilateral entire nail hallux through fifth toenails Ulcer Exam: There is no evidence of ulcer or pre-ulcerative changes or infection. Orthopedic Exam: Muscle tone and strength are WNL. No limitations in general ROM. No crepitus or effusions noted. Foot type and digits show no abnormalities. Bony prominences are unremarkable. Skin: No Porokeratosis. No infection or ulcers  Diagnosis:  Tinea unguium, Pain in right toe, pain in left toes  Treatment & Plan Procedures and Treatment: Consent by patient was obtained for treatment procedures. The patient understood the discussion of treatment and procedures well. All questions were answered thoroughly reviewed. Debridement of mycotic and hypertrophic toenails, 1 through 5 bilateral and clearing of subungual debris. No ulceration, no infection noted.  Return Visit-Office Procedure: Patient instructed to return to the office for a follow up visit 3 months for continued evaluation and treatment.

## 2015-09-03 ENCOUNTER — Encounter: Payer: Self-pay | Admitting: Cardiology

## 2015-09-08 ENCOUNTER — Encounter: Payer: Self-pay | Admitting: Cardiology

## 2015-09-08 ENCOUNTER — Ambulatory Visit (INDEPENDENT_AMBULATORY_CARE_PROVIDER_SITE_OTHER): Payer: Medicare Other | Admitting: Cardiology

## 2015-09-08 VITALS — BP 130/77 | HR 83 | Ht 66.0 in | Wt 217.4 lb

## 2015-09-08 DIAGNOSIS — Z9861 Coronary angioplasty status: Secondary | ICD-10-CM

## 2015-09-08 DIAGNOSIS — I2510 Atherosclerotic heart disease of native coronary artery without angina pectoris: Secondary | ICD-10-CM | POA: Diagnosis not present

## 2015-09-08 DIAGNOSIS — Z951 Presence of aortocoronary bypass graft: Secondary | ICD-10-CM

## 2015-09-08 DIAGNOSIS — I482 Chronic atrial fibrillation, unspecified: Secondary | ICD-10-CM

## 2015-09-08 DIAGNOSIS — I10 Essential (primary) hypertension: Secondary | ICD-10-CM | POA: Diagnosis not present

## 2015-09-08 NOTE — Patient Instructions (Addendum)
Continue your current therapy  I will see you in 4 months  

## 2015-09-08 NOTE — Progress Notes (Signed)
[name] Date of Birth: 11-20-1928   History of Present Illness: Mr. Sedivy is seen for followup atrial fibrillation and CAD.  He  Has a history of chronic atrial fibrillation managed with rate control and anticoagulation with coumadin. He also has a history of CAD s/p CABG in 1990. Echo in 2013 showed normal LV function. Very mild AS.  biatrial enlargement. In October 2016 he had an abnormal [place] which showed inferior wall ischemia. He underwent cardiac cath. This showed occlusion of the proximal LAD and a critical stenosis of the mid RCA. The SVG to diagonal was occluded. The LIMA to the LAD was patent. He had stenting of the mid RCA with a DES. He is on Plavix and Coumadin.   In January 2017 he had a severe nosebleed. He had packing but it did not stop. Subsequently had cautery by Dr. Redmond Baseman. No recurrent bleeding since then.   When last seen his HR was poorly controlled and his Toprol dose was increased.   He denies any significant palpitations,  chest pain, or shortness of breath. He has been monitoring his HR with exercise and typically  HR is 80-90 at rest and may increase to 130-140 with exercise.  This despite an increase in Toprol on his last visit. He is still asymptomatic.   Current Outpatient Prescriptions on File Prior to Visit  Medication Sig Dispense Refill  . AVODART 0.5 MG capsule Take 0.5 mg by mouth every other day.     . clopidogrel (PLAVIX) 75 MG tablet Take 1 tablet (75 mg total) by mouth daily with breakfast. 90 tablet 3  . diltiazem (CARDIZEM CD) 360 MG 24 hr capsule Take 1 capsule (360 mg total) by mouth daily. 30 capsule 6  . glucose blood (PRECISION XTRA TEST STRIPS) test strip 1 each by Other route See admin instructions. Check blood sugar once daily    . insulin glargine (LANTUS) 100 UNIT/ML injection Inject 32 Units into the skin daily.     [name] lisinopril (PRINIVIL,ZESTRIL) 5 MG tablet TAKE 1 TABLET BY MOUTH EVERY DAY 90 tablet 1  . metFORMIN  (GLUCOPHAGE) 1000 MG tablet Take 1 tablet by mouth 2 (two) times daily.    . metoprolol succinate (TOPROL-XL) 100 MG 24 hr tablet Take 1 tablet (100 mg total) by mouth 2 (two) times daily. 180 tablet 3  . nitroGLYCERIN (NITROSTAT) 0.4 MG SL tablet Place 0.4 mg under the tongue every 5 (five) minutes as needed for chest pain (x 3 doses).    . pantoprazole (PROTONIX) 40 MG tablet Take 1 tablet (40 mg total) by mouth daily at 12 noon. 90 tablet 3  . simvastatin (ZOCOR) 80 MG tablet Take 40 mg by mouth at bedtime.     . Tamsulosin HCl (FLOMAX) 0.4 MG CAPS Take 0.4 mg by mouth every other day.     . warfarin (COUMADIN) 5 MG tablet Take 1 to 1.5 tablets by mouth daily as directed by coumadin clinic 120 tablet 1   No current facility-administered medications on file prior to visit.    Allergies  Allergen Reactions  . Amoxicillin Other (See Comments)    Unknown  . Cefadroxil Other (See Comments)    Unknown  . Ciprofloxacin Other (See Comments)    Unknown  . Erythromycin Other (See Comments)    Upset stomach    Past Medical History  Diagnosis Date  . Mild aortic stenosis   . HTN (hypertension)   . Hyperlipemia   . Bladder outlet obstruction   .  BPH (benign prostatic hyperplasia)   . Kidney stones   . Abdominal pain   . Constipation   . Bladder outlet obstruction   . Acute renal failure ([place])      resolved  . Urinary tract infection      Enterococcus  . Rib fractures     left rib fractures being treated with pain medications  . CAD (coronary artery disease)     a. CABG IN 1989. b. 01/08/2015 CTO of ost LAD, LIMA to LAD not visualized but assumed patent given myoview finding, occluded SVG to diagonal, 99% mid RCA tx w/ SYNERGY DES 3X28 mm  . Diabetes mellitus type 2, insulin dependent ([place])   . Atrial fibrillation ([place])   . GERD (gastroesophageal reflux disease)   . Arthritis     "hands & legs" (11/'10/2014)  . Stented coronary artery Nov 2016    RCA DES  . Epistaxis, recurrent  Feb 2017    Past Surgical History  Procedure Laterality Date  . Coronary artery bypass graft  1989    "CABG X 2"  . Total hip arthroplasty Right 2000  . Cardiac catheterization  1989  . Carpal tunnel release Right 08/2010  . Cataract extraction w/ intraocular lens  implant, bilateral Bilateral   . Joint replacement    . Cardiac catheterization N/A 01/08/2015    Procedure: Left Heart Cath and Cors/Grafts Angiography;  Surgeon: Peter M Martinique, MD;  Location: [place] [facility];  Service: Cardiovascular;  Laterality: N/A;  . Cardiac catheterization  01/08/2015    Procedure: Coronary Stent Intervention;  Surgeon: Peter M Martinique, MD;  Location: [place] [facility];  Service: Cardiovascular;;  . Tonsillectomy    . Coronary angioplasty  01/08/15    RCA DES    History  Smoking status  . Former Smoker -- 3.00 packs/day for 10 years  . Types: Cigarettes  . Quit date: 05/11/1958  Smokeless tobacco  . Never Used    History  Alcohol Use No    Family History  Problem Relation Age of Onset  . Brain cancer Father   . Throat cancer Brother     Review of Systems: As noted in history of present illness.  All other systems were reviewed and are negative.  Physical Exam: There were no vitals taken for this visit. He is a pleasant elderly white male who is obese. HEENT exam is unremarkable. Sclera are clear. PERRLA. Oropharynx is clear. Neck is supple no JVD, adenopathy, thyromegaly, or bruits. Lungs are clear. Cardiac exam reveals a grade 1/6 systolic ejection murmur right upper sternal border. His pulse is irregular. Old median sternotomy scar. Abdomen is obese, soft, nontender. He has no edema. Pedal pulses are palpable. He is alert and oriented x3. Cranial nerves II through XII are intact. Skin is warm and dry.  LABORATORY DATA:  Lab Results  Component Value Date   WBC 9.9 03/29/2015   HGB 12.2* 03/29/2015   HCT 35.3* 03/29/2015   PLT 272 03/29/2015   GLUCOSE 211* 03/29/2015   ALT  25 11/28/2010   AST 21 11/28/2010   NA 140 03/29/2015   K 3.8 03/29/2015   CL 102 03/29/2015   CREATININE 1.09 03/29/2015   BUN 15 03/29/2015   CO2 27 03/29/2015   PSA 106.83 Test Methodology: [employer] PSA* 11/09/2009   INR 3.1 08/21/2015   HGBA1C 8.2* 11/28/2010      Assessment / Plan: 1. Chronic atrial fibrillation. Rate control is now good on increased beta blocker. Continue  Toprol XL to 100 mg bid.   On coumadin. Asymptomatic. Continue Cardizem.   2. CAD s/p remote CABG 25 years ago. S/p DES of RCA 01/08/15. Continue Plavix for at least one year.   3. Chronic anticoagulation- therapeutic.  4. HTN controlled.  5. DM type 2- on insulin  6. Hyperlipidemia.  7. Epistaxis.  No recurrence.    I will follow up in 4 months. Plan to stop Plavix at that time.

## 2015-09-22 ENCOUNTER — Telehealth: Payer: Self-pay | Admitting: Cardiology

## 2015-09-22 DIAGNOSIS — R42 Dizziness and giddiness: Secondary | ICD-10-CM

## 2015-09-22 NOTE — Telephone Encounter (Signed)
Patient is having dizziness - wants to go back to rehab (neuro[facility] center on [address]). He states this worked very well for him.   He needs a referral to back to this rehab as it has been 6 months  Informed him that both Dr. Martinique and Malachy Mood, LPN are out of the office this week but I will route the message to one of his partners to see if they will OK this referral on Dr. Doug Sou behalf.   Patient was quite disgruntled and said "what should I do, lay down and die" and he asked if Suanne Marker, [place] could to the referral and stated Dr. Martinique has like 3 PA's who work with him.   Apologized for the inconvenience. Advised he contact his PCP in the interim.   Message routed to DOD Dr. Claiborne Billings

## 2015-09-22 NOTE — Telephone Encounter (Signed)
New Message Pt requested to speak w/ RN- pt c/o dizziness wanted to go back to rehab to help his symptom- needed referral. Please call back and discuss.

## 2015-09-24 ENCOUNTER — Encounter: Payer: Self-pay | Admitting: Physical Therapy

## 2015-09-24 ENCOUNTER — Ambulatory Visit: Payer: Medicare Other | Attending: Cardiology | Admitting: Physical Therapy

## 2015-09-24 DIAGNOSIS — R2681 Unsteadiness on feet: Secondary | ICD-10-CM | POA: Diagnosis not present

## 2015-09-24 DIAGNOSIS — H8112 Benign paroxysmal vertigo, left ear: Secondary | ICD-10-CM | POA: Diagnosis not present

## 2015-09-24 DIAGNOSIS — R42 Dizziness and giddiness: Secondary | ICD-10-CM | POA: Insufficient documentation

## 2015-09-24 DIAGNOSIS — H8111 Benign paroxysmal vertigo, right ear: Secondary | ICD-10-CM | POA: Diagnosis not present

## 2015-09-24 DIAGNOSIS — R2689 Other abnormalities of gait and mobility: Secondary | ICD-10-CM | POA: Diagnosis not present

## 2015-09-24 NOTE — Therapy (Addendum)
[facility] [address] [place], [place], [address] Phone: [phone]   Fax:  [phone]  Physical Therapy Evaluation  Patient Details  Name: [name] MRN: [id number] Date of Birth: Jan 02, 1929 Referring Provider: Billey Chang, MD  Encounter Date: 09/24/2015      PT End of Session - 09/24/15 1752    Visit Number 1   Number of Visits 9  eval + 8 visits   Date for PT Re-Evaluation 10/24/15   Authorization Type Medicare Traditional primary; BCBS secondary   Authorization Time Period G Codes required   PT Start Time 1315   PT Stop Time 1401   PT Time Calculation (min) 46 min   Activity Tolerance Patient tolerated treatment well   Behavior During Therapy [facility] for tasks assessed/performed      Past Medical History:  Diagnosis Date  . Abdominal pain   . Acute renal failure ([place])     resolved  . Arthritis    "hands & legs" (11/'10/2014)  . Atrial fibrillation ([place])   . Bladder outlet obstruction   . Bladder outlet obstruction   . BPH (benign prostatic hyperplasia)   . CAD (coronary artery disease)    a. CABG IN 1989. b. 01/08/2015 CTO of ost LAD, LIMA to LAD not visualized but assumed patent given myoview finding, occluded SVG to diagonal, 99% mid RCA tx w/ SYNERGY DES 3X28 mm  . Constipation   . Diabetes mellitus type 2, insulin dependent ([place])   . Epistaxis, recurrent Feb 2017  . GERD (gastroesophageal reflux disease)   . HTN (hypertension)   . Hyperlipemia   . Kidney stones   . Mild aortic stenosis   . Rib fractures    left rib fractures being treated with pain medications  . Stented coronary artery Nov 2016   RCA DES  . Urinary tract infection     Enterococcus    Past Surgical History:  Procedure Laterality Date  . CARDIAC CATHETERIZATION  1989  . CARDIAC CATHETERIZATION N/A 01/08/2015   Procedure: Left Heart Cath and Cors/Grafts Angiography;  Surgeon: Peter M Martinique, MD;  Location: [facility];  Service: Cardiovascular;  Laterality: N/A;  . CARDIAC CATHETERIZATION  01/08/2015   Procedure: Coronary Stent Intervention;  Surgeon: Peter M Martinique, MD;  Location: [facility];  Service: Cardiovascular;;  . CARPAL TUNNEL RELEASE Right 08/2010  . CATARACT EXTRACTION W/ INTRAOCULAR LENS  IMPLANT, BILATERAL Bilateral   . CORONARY ANGIOPLASTY  01/08/15   RCA DES  . CORONARY ARTERY BYPASS GRAFT  1989   "CABG X 2"  . JOINT REPLACEMENT    . TONSILLECTOMY    . TOTAL HIP ARTHROPLASTY Right 2000    There were no vitals filed for this visit.       Subjective Assessment - 09/24/15 1317    Subjective "I don't have the spinning when I do the exercises you gave me...but when I'm lying down on my pillow and roll over to look at my alarm clock, and the clock moves."   Pertinent History Likes to be called "[name]". PMH significant for: HTN, HLD, CAD s/p CABG x2 (1990), acute renal failure, aortic stenosis, stented coronary artery   Currently in Pain? No/denies            [facility] PT Assessment - 09/24/15 0001      Assessment   Medical Diagnosis Vertigo   Referring Provider Billey Chang, MD   Onset Date/Surgical Date 09/20/15     Precautions  Precautions Fall     Balance Screen   Has the patient fallen in the past 6 months No   Has the patient had a decrease in activity level because of a fear of falling?  No   Is the patient reluctant to leave their home because of a fear of falling?  No     Home Environment   Living Environment Private residence   Living Arrangements Spouse/significant other   Type of [place] Access Level entry   [place] Two level   Alternate Level Stairs-Number of Steps 12   [place] - single point     Prior Function   Level of [place] Retired     Associate Professor   Overall Cognitive Status Within Functional Limits for tasks assessed             Vestibular Assessment - 09/24/15 1319      Symptom Behavior   Type of Dizziness Spinning   Frequency of Dizziness daily   Aggravating Factors Turning head quickly;Rolling to right;Rolling to left   Relieving Factors Head stationary     Occulomotor Exam   Occulomotor Alignment Normal   Spontaneous Absent   Gaze-induced Left beating nystagmus with L gaze   Smooth Pursuits Intact     Positional Testing   Dix-Hallpike Dix-Hallpike Right;Dix-Hallpike Left   Sidelying Test Sidelying Right;Sidelying Left   Horizontal Canal Testing Horizontal Canal Right;Horizontal Canal Left;Horizontal Canal Right Intensity;Horizontal Canal Left Intensity     Dix-Hallpike Right   Dix-Hallpike Right Duration NA   Dix-Hallpike Right Symptoms No nystagmus     Dix-Hallpike Left   Dix-Hallpike Left Duration > 20 seconds of  very low amplitude nystagmus accompanied by "dizzy feeling" but no true spinning   Dix-Hallpike Left Symptoms Upbeat, left rotatory nystagmus     Sidelying Right   Sidelying Right Duration NA   Sidelying Right Symptoms No nystagmus     Sidelying Left   Sidelying Left Duration NA   Sidelying Left Symptoms No nystagmus     Horizontal Canal Right   Horizontal Canal Right Duration Approx. 30 seconds   Horizontal Canal Right Symptoms Ageotrophic;Nystagmus     Horizontal Canal Left   Horizontal Canal Left Duration Approx. 20 seconds   Horizontal Canal Left Symptoms Ageotrophic;Nystagmus     Horizontal Canal Right Intensity   Horizontal Canal Right Intensity Mild  however, amplitude/duration of nystagmus on R > L     Horizontal Canal Left Intensity   Horizontal Canal Left Intensity Moderate               [facility] Adult PT Treatment/Exercise - 09/24/15 0001      Transfers   Transfers Sit to Stand;Stand to Sit   Sit to Stand 6: Modified independent (Device/Increase time)   Stand to Sit 6: Modified independent (Device/Increase time)     Ambulation/Gait   Ambulation/Gait  Yes   Ambulation/Gait Assistance 5: Supervision   Ambulation/Gait Assistance Details Pt required (S) for all ambulation over level, indoor surfaces after bed mobility (rolling, supine <> sit).   Ambulation Distance (Feet) 100 Feet  x2   Assistive device None   Gait Pattern Decreased arm swing - right;Decreased arm swing - left;Decreased stride length   Ambulation Surface Level;Indoor         Vestibular Treatment/Exercise - 09/24/15 0001      Vestibular Treatment/Exercise   Vestibular Treatment Provided Canalith Repositioning;Habituation   Canalith  Repositioning Comment;Epley Manuever Left   Habituation Exercises Horizontal Roll      EPLEY MANUEVER LEFT   Number of Reps  1   Overall Response  No change    RESPONSE DETAILS LEFT Did not formally reassess due to time constraint.     OTHER   Comment Performed R Gufoni x2 trials. Reassessment reveals ageotropic nystagmus grossly unchanged; therefore (due to severity of nystagmus on R > L), performed L Gufoni x2 trials. Reassessment reveals no nystagmus, no symptoms; however, question if nystagmus fatigued during session.      Horizontal Roll   Number of Reps  2   Symptom Description  With PT cueing, pt effective performed fast rolling for HC habituation.               PT Education - 09/24/15 1750    Education provided Yes   Education Details PT eval findings, goals, and POC. Explained BPPV specific to cupulolithiasis, treatment, and what to expect after this session. Initiated HEP for fast rolling and provided education on prolonged positioning.    Person(s) Educated Patient   Methods Explanation;Demonstration;Handout;Verbal cues   Comprehension Verbalized understanding;Returned demonstration          PT Short Term Goals - 06/23/15 1307      PT SHORT TERM GOAL #1   Title STG's = LTG's          PT Long Term Goals - 09/26/15 0749      PT LONG TERM GOAL #1   Title Pt will independently perform HEP to maximize  functional gains made in PT.   (Target date: 10/22/15)   Status New     PT LONG TERM GOAL #2   Title Positional vertigo testing will be negative to indicate resolved BPPV.  (10/22/15)   Status New     PT LONG TERM GOAL #3   Title Assess strength and balance, if indicated, after BPPV resolves.  (10/22/15)   Status New                Plan - 09/24/15 1750    Clinical Impression Statement Pt is an 82 y/o M referred to vestibular PT to address vertigo.  MH significant for: HTN, HLD, CAD s/p CABG x2 (1990), acute renal failure, aortic stenosis, stented coronary artery. PT evaluation reveals the following: L gaze-evoked nystagmus; L Dix-Hallpike with L upbeating, torsional nystagmus accompanied by veritinous symptoms; ageotropic nystagmis with B rolling; intensity of symptoms on L > R; but amplitude and duration of nystagmus on R > L. Unable to rule out L posterior canalithasis; R vs. L horizontal canal cupulolithiasis (difficult to discern laterality due to discrepancy between severity of nystagmus and symptoms, which may be attributable to multi-canal BPPV); and vestibular hypofunction. Performed R Gufoni maneuver x2 trials with no change in symptoms or ageotropic nystagmus upon reassessment. Therefore, performed L Gufoni x2 trials as well as L Epley x1 trial. Unable to reassess due to time constraint. Pt will benefit from skilled outpatient PT 2x/week for up to 4 weeks to address said impairments.    Rehab Potential Good   Clinical Impairments Affecting Rehab Potential unable to rule out multi-canal BPPV on evaluation   PT Frequency 2x / week   PT Duration 4 weeks   PT Treatment/Interventions ADLs/Self Care Home Management;Gait training;Stair training;Neuromuscular re-education;Functional mobility training;Therapeutic activities;Patient/family education;Therapeutic exercise;Vestibular;Canalith Repostioning   PT Next Visit Plan Reassess for BPPV and treat prn.   PT Home Exercise Plan 7/26: fast  rolling for habituation  Consulted and Agree with Plan of Care Patient      Patient will benefit from skilled therapeutic intervention in order to improve the following deficits and impairments:  Abnormal gait, Decreased balance, Dizziness  Visit Diagnosis: BPPV (benign paroxysmal positional vertigo), left - Plan: PT plan of care cert/re-cert  BPPV (benign paroxysmal positional vertigo), right - Plan: PT plan of care cert/re-cert  Dizziness and giddiness - Plan: PT plan of care cert/re-cert  Other abnormalities of gait and mobility - Plan: PT plan of care cert/re-cert      G-Codes - [id number] 1802    Functional Assessment Tool Used Clinical Judgement; unable to rule out mutli-canal BPPV on evaluation   Functional Limitation Mobility: Walking and moving around   Mobility: Walking and Moving Around Current Status [phone]) At least 20 percent but less than 40 percent impaired, limited or restricted   Mobility: Walking and Moving Around Goal Status [phone]) At least 1 percent but less than 20 percent impaired, limited or restricted       Problem List Patient Active Problem List   Diagnosis Date Noted  . Epistaxis, recurrent 04/11/2015  . CAD S/P PCI- Nov 2016   . Abnormal nuclear stress test 01/08/2015  . Vertigo, benign positional 06/25/2014  . Encounter for therapeutic drug monitoring 04/13/2013  . Chronic anticoagulation 09/09/2011  . Chronic atrial fibrillation ([place]) 07/30/2011  . Hx of CABG x 2 1990   . HTN (hypertension)   . Diabetes mellitus type 2, insulin dependent ([place])   . Hyperlipemia   . BPH (benign prostatic hyperplasia)     Billie Ruddy, PT, DPT [facility] [address] [place], [place], [address] Phone: [phone]   Fax:  [phone] 09/26/15, 7:51 AM    Name: [name] MRN: [id number] Date of Birth: 1928-08-03

## 2015-09-24 NOTE — Patient Instructions (Signed)
[name], Try sleeping on your RIGHT side at night.  Tip Card 1.The goal of habituation training is to assist in decreasing symptoms of vertigo, dizziness, or nausea provoked by specific head and body motions. 2.These exercises may initially increase symptoms; however, be persistent and work through symptoms. With repetition and time, the exercises will assist in reducing or eliminating symptoms. 3.Exercises should be stopped and discussed with the therapist if you experience any of the following: - Sudden change or fluctuation in hearing - New onset of ringing in the ears, or increase in current intensity - Any fluid discharge from the ear - Severe pain in neck or back - Extreme nausea  Copyright  VHI. All rights reserved.  Rolling   With pillow under head, start on back. Roll quickly to your right side.  Hold until dizziness stops, plus 20 seconds and then roll quickly to the left side.  Hold until dizziness stops, plus 20 seconds.  Repeat sequence 5 times per session. Do 2 sessions per day.

## 2015-09-25 ENCOUNTER — Ambulatory Visit: Payer: Medicare Other

## 2015-09-26 ENCOUNTER — Ambulatory Visit: Payer: Medicare Other

## 2015-09-26 ENCOUNTER — Ambulatory Visit: Payer: Medicare Other | Admitting: Physical Therapy

## 2015-09-26 DIAGNOSIS — H8111 Benign paroxysmal vertigo, right ear: Secondary | ICD-10-CM | POA: Diagnosis not present

## 2015-09-26 DIAGNOSIS — R2689 Other abnormalities of gait and mobility: Secondary | ICD-10-CM | POA: Diagnosis not present

## 2015-09-26 DIAGNOSIS — R2681 Unsteadiness on feet: Secondary | ICD-10-CM

## 2015-09-26 DIAGNOSIS — H8112 Benign paroxysmal vertigo, left ear: Secondary | ICD-10-CM | POA: Diagnosis not present

## 2015-09-26 DIAGNOSIS — R42 Dizziness and giddiness: Secondary | ICD-10-CM | POA: Diagnosis not present

## 2015-09-26 NOTE — Therapy (Signed)
[facility] [address] [place], [place], [address] Phone: [phone]   Fax:  [phone]  Physical Therapy Treatment  Patient Details  Name: [name] MRN: [id number] Date of Birth: August 18, 1928 Referring Provider: Billey Chang, MD  Encounter Date: 09/26/2015      PT End of Session - 09/26/15 1440    Visit Number 2   Number of Visits 9   Date for PT Re-Evaluation 10/24/15   Authorization Type Medicare Traditional primary; BCBS secondary   Authorization Time Period G Codes required   PT Start Time 0805   PT Stop Time 0845   PT Time Calculation (min) 40 min   Equipment Utilized During Treatment Other (comment)  Balance Master and harness   Activity Tolerance Patient tolerated treatment well   Behavior During Therapy [facility] for tasks assessed/performed      Past Medical History:  Diagnosis Date  . Abdominal pain   . Acute renal failure ([place])     resolved  . Arthritis    "hands & legs" (11/'10/2014)  . Atrial fibrillation ([place])   . Bladder outlet obstruction   . Bladder outlet obstruction   . BPH (benign prostatic hyperplasia)   . CAD (coronary artery disease)    a. CABG IN 1989. b. 01/08/2015 CTO of ost LAD, LIMA to LAD not visualized but assumed patent given myoview finding, occluded SVG to diagonal, 99% mid RCA tx w/ SYNERGY DES 3X28 mm  . Constipation   . Diabetes mellitus type 2, insulin dependent ([place])   . Epistaxis, recurrent Feb 2017  . GERD (gastroesophageal reflux disease)   . HTN (hypertension)   . Hyperlipemia   . Kidney stones   . Mild aortic stenosis   . Rib fractures    left rib fractures being treated with pain medications  . Stented coronary artery Nov 2016   RCA DES  . Urinary tract infection     Enterococcus    Past Surgical History:  Procedure Laterality Date  . CARDIAC CATHETERIZATION  1989  . CARDIAC CATHETERIZATION N/A 01/08/2015   Procedure: Left Heart Cath and Cors/Grafts  Angiography;  Surgeon: Peter M Martinique, MD;  Location: [facility];  Service: Cardiovascular;  Laterality: N/A;  . CARDIAC CATHETERIZATION  01/08/2015   Procedure: Coronary Stent Intervention;  Surgeon: Peter M Martinique, MD;  Location: [facility];  Service: Cardiovascular;;  . CARPAL TUNNEL RELEASE Right 08/2010  . CATARACT EXTRACTION W/ INTRAOCULAR LENS  IMPLANT, BILATERAL Bilateral   . CORONARY ANGIOPLASTY  01/08/15   RCA DES  . CORONARY ARTERY BYPASS GRAFT  1989   "CABG X 2"  . JOINT REPLACEMENT    . TONSILLECTOMY    . TOTAL HIP ARTHROPLASTY Right 2000    There were no vitals filed for this visit.      Subjective Assessment - 09/26/15 0807    Subjective Dizziness is much better. Per pt, "The clock hasn't moved since last tiume I was here, so that's good."   Pertinent History Likes to be called "[name]". PMH significant for: HTN, HLD, CAD s/p CABG x2 (1990), acute renal failure, aortic stenosis, stented coronary artery   Currently in Pain? No/denies                Vestibular Assessment - 09/26/15 0001      Vestibulo-Occular Reflex   Comment B Head Thrust Test (+) but asymptomatic     Positional Testing   Dix-Hallpike Dix-Hallpike Right;Dix-Hallpike Left   Horizontal Canal  Testing Horizontal Canal Right;Horizontal Canal Left     Dix-Hallpike Right   Dix-Hallpike Right Duration NA   Dix-Hallpike Right Symptoms No nystagmus     Dix-Hallpike Left   Dix-Hallpike Left Duration NA   Dix-Hallpike Left Symptoms No nystagmus     Horizontal Canal Right   Horizontal Canal Right Duration NA   Horizontal Canal Right Symptoms Normal     Horizontal Canal Left   Horizontal Canal Left Duration NA   Horizontal Canal Left Symptoms Normal     Equities trader Comment Composite score = 36% compared to age/height * normative value of 63%%. Sensory analysis: Vestibular 0% (norm = 50%); Visual = 38% (norm = 74%).  Findings suggest significant impairment in ability to use vestibular input for balance, moderately impaired ability to use visual input for balance.  7 "falls" sustained, 1 during Condition 4, and 3 each during Conditions 5 and 6. Ankle strategy dominant during Conditions 5 and 6. COG alignment: to L of midline with anterior preference. *Note: findings compared with norms for 80 y/o subjects due to limited research on patients subjects > 62 y/o.                  Vestibular Treatment/Exercise - 09/26/15 0001      Vestibular Treatment/Exercise   Vestibular Treatment Provided Gaze   Gaze Exercises X1 Viewing Horizontal;X1 Viewing Vertical     X1 Viewing Horizontal   Foot Position standing; shoulder width   Reps 2   Comments 2 x30-sec trials with cueing for technique, speed/amplitude of head movement     X1 Viewing Vertical   Foot Position standing; shoulder width   Reps 1   Comments x30 seconds             Balance Exercises - 09/26/15 1438      Balance Exercises: Standing   Standing Eyes Closed Narrow base of support (BOS);Head turns;Foam/compliant surface;Other reps (comment)  1 pillow; horizontal, vertical head turns x10 each           PT Education - 09/26/15 1438    Education provided Yes   Education Details SOT findings and implications. Initiated HEP for vestibular reliance and gaze stabilization.   Person(s) Educated Patient   Methods Explanation;Demonstration;Verbal cues;Handout   Comprehension Verbalized understanding;Returned demonstration          PT Short Term Goals - 09/26/15 0749      PT SHORT TERM GOAL #1   Title STG's = LTG's           PT Long Term Goals - 09/26/15 1441      PT LONG TERM GOAL #1   Title Pt will independently perform HEP to maximize functional gains made in PT.   (Target date: 10/22/15)   Status On-going     PT LONG TERM GOAL #2   Title Positional vertigo testing will be negative to indicate resolved BPPV.   (10/22/15)   Baseline Met 7/28.   Status Achieved     PT LONG TERM GOAL #3   Title Assess strength and balance, if indicated, after BPPV resolves.  (10/22/15)   Baseline Met 7/28.   Status Achieved     PT LONG TERM GOAL #4   Title SOT composite score will improve from 36% to 44% to indicate significant improvement in multi-sensory input for balance. (10/22/15)   Status New     PT LONG TERM GOAL #5   Title SOT Visual score will  improve from 38% to >60% to >/= 74% to indicate normalized use of visual input for balance.  (10/22/15)   Status New     Additional Long Term Goals   Additional Long Term Goals Yes     PT LONG TERM GOAL #6   Title SOT Vestibular score will improve from 0% to >/= 25% to indicate improved used of vestibular input for balance.  (10/22/15)   Status New               Plan - 09/26/15 1646    Clinical Impression Statement Positional testing (-) and asymptomatic today, suggesting BPPV cleared. SOT findings suggest moderate impairment in use of visual input for balance and significantly impaired ability to use vestibular input for balance. Added LTG's to address said impairments.   Rehab Potential Good   Clinical Impairments Affecting Rehab Potential --   PT Frequency 2x / week   PT Duration 4 weeks   PT Treatment/Interventions ADLs/Self Care Home Management;Gait training;Stair training;Neuromuscular re-education;Functional mobility training;Therapeutic activities;Patient/family education;Therapeutic exercise;Vestibular;Canalith Repostioning;Balance training   PT Next Visit Plan Assess and progress vestibular HEP.   PT Home Exercise Plan 7/28: initiated corner balance (vestibular reliance) and x1 viewing   Consulted and Agree with Plan of Care Patient      Patient will benefit from skilled therapeutic intervention in order to improve the following deficits and impairments:  Abnormal gait, Decreased balance, Dizziness  Visit Diagnosis: Other abnormalities of  gait and mobility - Plan: PT plan of care cert/re-cert  Unsteadiness on feet - Plan: PT plan of care cert/re-cert     Problem List Patient Active Problem List   Diagnosis Date Noted  . Epistaxis, recurrent 04/11/2015  . CAD S/P PCI- Nov 2016   . Abnormal nuclear stress test 01/08/2015  . Vertigo, benign positional 06/25/2014  . Encounter for therapeutic drug monitoring 04/13/2013  . Chronic anticoagulation 09/09/2011  . Chronic atrial fibrillation ([place]) 07/30/2011  . Hx of CABG x 2 1990   . HTN (hypertension)   . Diabetes mellitus type 2, insulin dependent ([place])   . Hyperlipemia   . BPH (benign prostatic hyperplasia)     Billie Ruddy, PT, DPT [facility] [address] [place], [place], [address] Phone: [phone]   Fax:  [phone] 09/26/15, 7:51 PM  Name: [name] MRN: [id number] Date of Birth: 07-24-1928

## 2015-09-26 NOTE — Addendum Note (Signed)
Addended by: Billie Ruddy A on: 09/26/2015 07:54 AM   Modules accepted: Orders

## 2015-09-26 NOTE — Patient Instructions (Signed)
[name], you can stop doing the rolling for now. Also, don't worry about sleeping on your left side anymore.  Feet Together (Compliant Surface) Head Motion - Eyes Closed    Stand with your back to a corner with a stable chair in front of you. Stand on 1 pillow with feet together. Close eyes and move head slowly: up and down 10 times; and right to left 10 times. Repeat __2-3__ times per day.  Gaze Stabilization: Standing Feet Apart    Place your target ("A") at eye-level. Stand with feet shoulder width apart, keeping eyes on target ("A") on wall __3__ feet away, tilt head down slightly and move head side to side for __30__ seconds. Do _2-3___ sessions per day.  Gaze Stabilization: Tip Card  1.Target must remain in focus, not blurry, and appear stationary while head is in motion. 2.Perform exercises with small head movements (45 to either side of midline). 3.Increase speed of head motion so long as target is in focus.

## 2015-09-28 NOTE — Telephone Encounter (Signed)
He is Ok to refer for vestibular Rehab for vertigo.  Peter Martinique MD, [facility]

## 2015-09-29 ENCOUNTER — Ambulatory Visit: Payer: Medicare Other | Admitting: Physical Therapy

## 2015-09-29 DIAGNOSIS — R42 Dizziness and giddiness: Secondary | ICD-10-CM | POA: Diagnosis not present

## 2015-09-29 DIAGNOSIS — H8111 Benign paroxysmal vertigo, right ear: Secondary | ICD-10-CM

## 2015-09-29 DIAGNOSIS — R2689 Other abnormalities of gait and mobility: Secondary | ICD-10-CM | POA: Diagnosis not present

## 2015-09-29 DIAGNOSIS — R2681 Unsteadiness on feet: Secondary | ICD-10-CM | POA: Diagnosis not present

## 2015-09-29 DIAGNOSIS — H8112 Benign paroxysmal vertigo, left ear: Secondary | ICD-10-CM | POA: Diagnosis not present

## 2015-09-29 NOTE — Therapy (Signed)
[facility] [address] [place], [place], [address] Phone: [phone]   Fax:  [phone]  Physical Therapy Treatment  Patient Details  Name: [name] MRN: [id number] Date of Birth: Apr 06, 1928 Referring Provider: Billey Chang, MD  Encounter Date: 09/29/2015      PT End of Session - 09/29/15 0816    Visit Number 3   Number of Visits 9   Date for PT Re-Evaluation 10/24/15   Authorization Type Medicare Traditional primary; BCBS secondary   Authorization Time Period G Codes required   PT Start Time 0804   PT Stop Time 0845   PT Time Calculation (min) 41 min   Activity Tolerance Patient tolerated treatment well   Behavior During Therapy [facility] for tasks assessed/performed      Past Medical History:  Diagnosis Date  . Abdominal pain   . Acute renal failure ([place])     resolved  . Arthritis    "hands & legs" (11/'10/2014)  . Atrial fibrillation ([place])   . Bladder outlet obstruction   . Bladder outlet obstruction   . BPH (benign prostatic hyperplasia)   . CAD (coronary artery disease)    a. CABG IN 1989. b. 01/08/2015 CTO of ost LAD, LIMA to LAD not visualized but assumed patent given myoview finding, occluded SVG to diagonal, 99% mid RCA tx w/ SYNERGY DES 3X28 mm  . Constipation   . Diabetes mellitus type 2, insulin dependent ([place])   . Epistaxis, recurrent Feb 2017  . GERD (gastroesophageal reflux disease)   . HTN (hypertension)   . Hyperlipemia   . Kidney stones   . Mild aortic stenosis   . Rib fractures    left rib fractures being treated with pain medications  . Stented coronary artery Nov 2016   RCA DES  . Urinary tract infection     Enterococcus    Past Surgical History:  Procedure Laterality Date  . CARDIAC CATHETERIZATION  1989  . CARDIAC CATHETERIZATION N/A 01/08/2015   Procedure: Left Heart Cath and Cors/Grafts Angiography;  Surgeon: Peter M Martinique, MD;  Location: [place] [facility];  Service:  Cardiovascular;  Laterality: N/A;  . CARDIAC CATHETERIZATION  01/08/2015   Procedure: Coronary Stent Intervention;  Surgeon: Peter M Martinique, MD;  Location: [place] [facility];  Service: Cardiovascular;;  . CARPAL TUNNEL RELEASE Right 08/2010  . CATARACT EXTRACTION W/ INTRAOCULAR LENS  IMPLANT, BILATERAL Bilateral   . CORONARY ANGIOPLASTY  01/08/15   RCA DES  . CORONARY ARTERY BYPASS GRAFT  1989   "CABG X 2"  . JOINT REPLACEMENT    . TONSILLECTOMY    . TOTAL HIP ARTHROPLASTY Right 2000    There were no vitals filed for this visit.      Subjective Assessment - 09/29/15 0806    Subjective "You know, I had a little spell of the dizzies yesterday. I was rolling over in bed."   Pertinent History Likes to be called "[name]". PMH significant for: HTN, HLD, CAD s/p CABG x2 (1990), acute renal failure, aortic stenosis, stented coronary artery   Currently in Pain? No/denies                Vestibular Assessment - 09/29/15 0001      Positional Testing   Dix-Hallpike Dix-Hallpike Right;Dix-Hallpike Left   Horizontal Canal Testing Horizontal Canal Right;Horizontal Canal Left;Horizontal Canal Right Intensity;Horizontal Canal Left Intensity     Dix-Hallpike Right   Dix-Hallpike Right Duration NA   Dix-Hallpike Right Symptoms No nystagmus  Dix-Hallpike Left   Dix-Hallpike Left Duration NA   Dix-Hallpike Left Symptoms No nystagmus     Horizontal Canal Right   Horizontal Canal Right Duration < 10 sec   Horizontal Canal Right Symptoms Geotrophic;Ageotrophic;Nystagmus     Horizontal Canal Left   Horizontal Canal Left Duration Approx. 10 seconds geotropic followed by 15 seconds ageotropic   Horizontal Canal Left Symptoms Ageotrophic;Nystagmus     Horizontal Canal Right Intensity   Horizontal Canal Right Intensity Mild     Horizontal Canal Left Intensity   Horizontal Canal Left Intensity Moderate                  Vestibular Treatment/Exercise - 09/29/15 0001       Vestibular Treatment/Exercise   Vestibular Treatment Provided Canalith Repositioning;Habituation;Gaze   Canalith Repositioning Comment   Habituation Exercises Horizontal Roll   Gaze Exercises X1 Viewing Horizontal;X1 Viewing Vertical     OTHER   Comment Performed Cupulolith Repositioning Maneuver  - Ageotropic Right LC described by Liborio Nixon al (2012) with each position held x2 minutes and vibrator at R mastoid process for initial 30 seconds of positions 1 and 4. Reassessment reveals nystagmus (ageotropic) and symptoms grossly unchanged. Therefore, instructed pt to resume fast rolling; see below for details.     Horizontal Roll   Number of Reps  2   Symptom Description  reviewed fast rolling x2 reps with effective return demo     X1 Viewing Horizontal   Foot Position standing   Reps 1   Comments 30-seconds with cueing for increased speed,, decreased amplitude of head movement     X1 Viewing Vertical   Foot Position standing   Reps 1   Comments 30 seconds            Balance Exercises - 09/29/15 1257      Balance Exercises: Standing   Standing Eyes Closed Wide (BOA);Head turns;Foam/compliant surface;5 reps  1 pillow; horizontal, vertical head turns x5 each             PT Short Term Goals - 09/26/15 0749      PT SHORT TERM GOAL #1   Title STG's = LTG's           PT Long Term Goals - 09/26/15 1441      PT LONG TERM GOAL #1   Title Pt will independently perform HEP to maximize functional gains made in PT.   (Target date: 10/22/15)   Status On-going     PT LONG TERM GOAL #2   Title Positional vertigo testing will be negative to indicate resolved BPPV.  (10/22/15)   Baseline Met 7/28.   Status Achieved     PT LONG TERM GOAL #3   Title Assess strength and balance, if indicated, after BPPV resolves.  (10/22/15)   Baseline Met 7/28.   Status Achieved     PT LONG TERM GOAL #4   Title SOT composite score will improve from 36% to 44% to indicate significant  improvement in multi-sensory input for balance. (10/22/15)   Status New     PT LONG TERM GOAL #5   Title SOT Visual score will improve from 38% to >60% to >/= 74% to indicate normalized use of visual input for balance.  (10/22/15)   Status New     Additional Long Term Goals   Additional Long Term Goals Yes     PT LONG TERM GOAL #6   Title SOT Vestibular score will improve from 0% to >/=  25% to indicate improved used of vestibular input for balance.  (10/22/15)   Status New               Plan - 09/29/15 6387    Clinical Impression Statement Pt arrived to session with report of reoccurrence of vertiginous symptoms with rolling in bed over the weekend. Testing of horizontal canals reveals ageotropic nystagmus (severity of symptoms, duration of nystagmus on L > R). Unable to rule out R horizontal canal cupulolithiasis. Therefore, performed R Cupulolith Repositioning Maneuver described by Liborio Nixon al (2012) x1 trial.  Reasessment of horizontal canals reveals nystagmus (ageotropic) and symptoms grossly unchanged. Therefore, instructed pt to resume fast rolling for HC habituation and to attempt to sleep on R side for prolonged positioning. Pt with    Rehab Potential Good   Clinical Impairments Affecting Rehab Potential unable to rule out multi-canal BPPV on evaluation   PT Frequency 2x / week   PT Duration 4 weeks   PT Treatment/Interventions ADLs/Self Care Home Management;Gait training;Stair training;Neuromuscular re-education;Functional mobility training;Therapeutic activities;Patient/family education;Therapeutic exercise;Vestibular;Canalith Repostioning;Balance training   PT Next Visit Plan Reassess for [facility] BPPV and treat prn.  Assess and progress vestibular HEP.   Consulted and Agree with Plan of Care Patient      Patient will benefit from skilled therapeutic intervention in order to improve the following deficits and impairments:  Abnormal gait, Decreased balance, Dizziness  Visit  Diagnosis: BPPV (benign paroxysmal positional vertigo), right  Dizziness and giddiness  Other abnormalities of gait and mobility     Problem List Patient Active Problem List   Diagnosis Date Noted  . Epistaxis, recurrent 04/11/2015  . CAD S/P PCI- Nov 2016   . Abnormal nuclear stress test 01/08/2015  . Vertigo, benign positional 06/25/2014  . Encounter for therapeutic drug monitoring 04/13/2013  . Chronic anticoagulation 09/09/2011  . Chronic atrial fibrillation ([place]) 07/30/2011  . Hx of CABG x 2 1990   . HTN (hypertension)   . Diabetes mellitus type 2, insulin dependent ([place])   . Hyperlipemia   . BPH (benign prostatic hyperplasia)    Billie Ruddy, PT, DPT [facility] [address] [place], [place], [address] Phone: [phone]   Fax:  [phone] 09/29/15, 1:06 PM  Name: [name] MRN: [id number] Date of Birth: [birth year]

## 2015-09-29 NOTE — Patient Instructions (Addendum)
[name], Go back to trying to sleep on your RIGHT side at night.  Tip Card 1.The goal of habituation training is to assist in decreasing symptoms of vertigo, dizziness, or nausea provoked by specific head and body motions. 2.These exercises may initially increase symptoms; however, be persistent and work through symptoms. With repetition and time, the exercises will assist in reducing or eliminating symptoms. 3.Exercises should be stopped and discussed with the therapist if you experience any of the following: - Sudden change or fluctuation in hearing - New onset of ringing in the ears, or increase in current intensity - Any fluid discharge from the ear - Severe pain in neck or back - Extreme nausea  Copyright  VHI. All rights reserved.  Rolling   With pillow under head, start on back. Roll quickly to your right side.  Hold until dizziness stops, plus 20 seconds and then roll quickly to the left side.  Hold until dizziness stops, plus 20 seconds.  Repeat sequence 5 times per session. Do 2 sessions per day.  Feet Apart (Compliant Surface) Head Motion - Eyes Closed    Stand with your back to a corner with a stable chair in front of you. Stand on 1 pillow with feet shoulder width apart. Close eyes and move head slowly: up and down 10 times; and right to left 10 times. Repeat __2-3__ times per day.  Gaze Stabilization: Standing Feet Apart    Place your target ("A") at eye-level. Stand with feet shoulder width apart, keeping eyes on target ("A") on wall __3__ feet away, tilt head down slightly and move head side to side for __30__ seconds. Do _2-3___ sessions per day.  Gaze Stabilization: Tip Card  1.Target must remain in focus, not blurry, and appear stationary while head is in motion. 2.Perform exercises with small head movements (45 to either side of midline). 3.Increase speed of head motion so long as target is in focus.

## 2015-10-02 ENCOUNTER — Ambulatory Visit: Payer: Medicare Other | Attending: Cardiology | Admitting: Physical Therapy

## 2015-10-02 ENCOUNTER — Telehealth: Payer: Self-pay | Admitting: Cardiology

## 2015-10-02 DIAGNOSIS — R2689 Other abnormalities of gait and mobility: Secondary | ICD-10-CM | POA: Diagnosis not present

## 2015-10-02 DIAGNOSIS — R2681 Unsteadiness on feet: Secondary | ICD-10-CM | POA: Diagnosis not present

## 2015-10-02 DIAGNOSIS — R42 Dizziness and giddiness: Secondary | ICD-10-CM | POA: Diagnosis not present

## 2015-10-02 NOTE — Telephone Encounter (Signed)
Returned call to patient's wife.She wanted to schedule follow up visit with Dr.Jordan.Appointment scheduled 01/19/16 at 3:30 pm.

## 2015-10-02 NOTE — Telephone Encounter (Signed)
Returned call to patient spoke to wife.She stated husband's PCP already scheduled appointment for vestibular rehab.Stated his dizziness has improved.

## 2015-10-02 NOTE — Patient Instructions (Addendum)
[name], Do the fast rolling exercise once per day until next time you come to therapy. If you have symptoms when doing fast rolling exercise, go back to twice per day until next session.  Tip Card 1.The goal of habituation training is to assist in decreasing symptoms of vertigo, dizziness, or nausea provoked by specific head and body motions. 2.These exercises may initially increase symptoms; however, be persistent and work through symptoms. With repetition and time, the exercises will assist in reducing or eliminating symptoms. 3.Exercises should be stopped and discussed with the therapist if you experience any of the following: - Sudden change or fluctuation in hearing - New onset of ringing in the ears, or increase in current intensity - Any fluid discharge from the ear - Severe pain in neck or back - Extreme nausea  Copyright  VHI. All rights reserved. Rolling   With pillow under head, start on back. Roll quicklyto your right side. Hold until dizziness stops, plus 20 seconds and then roll quicklyto the left side. Hold until dizziness stops, plus 20 seconds. Repeat sequence 5 times per session. Do 2 sessions per day.  Feet Together (Compliant Surface) Head Motion - Eyes Closed    Stand with your back to a corner with a stable chair in front of you. Stand on 1 pillow with feet together. Close eyes and move head slowly: up and down 10 times; and right to left 10 times; diagonally up-right to down-left 10 times; diagonally up-left to down-right 10 times. Repeat __2__ times per day.  Gaze Stabilization: Standing Feet Apart    Place your target ("A") at eye-level. Stand with feet shoulder width apart, keeping eyes on target ("A") on wall __3__ feet away, tilt head down slightly and move head side to side for __45__ seconds. Do _2-3___ sessions per day.  Gaze Stabilization: Tip Card  1.Target must remain in focus, not blurry, and appear stationary while head is in  motion. 2.Perform exercises with small head movements (45 to either side of midline). 3.Increase speed of head motion so long as target is in focus.

## 2015-10-02 NOTE — Therapy (Signed)
[name] [address] [place], [place], [address] Phone: [phone]   Fax:  [phone]  Physical Therapy Treatment  Patient Details  Name: [name] MRN: [id number] Date of Birth: [birth year] Referring Provider: Billey Chang, MD  Encounter Date: 10/02/2015      PT End of Session - 10/02/15 0853    Visit Number 4   Number of Visits 9   Date for PT Re-Evaluation 10/24/15   Authorization Type Medicare Traditional primary; BCBS secondary   Authorization Time Period G Codes required   PT Start Time 0804   PT Stop Time 0842   PT Time Calculation (min) 38 min   Activity Tolerance Patient tolerated treatment well   Behavior During Therapy [facility] for tasks assessed/performed      Past Medical History:  Diagnosis Date  . Abdominal pain   . Acute renal failure ([place])     resolved  . Arthritis    "hands & legs" (11/'10/2014)  . Atrial fibrillation ([place])   . Bladder outlet obstruction   . Bladder outlet obstruction   . BPH (benign prostatic hyperplasia)   . CAD (coronary artery disease)    a. CABG IN 1989. b. 01/08/2015 CTO of ost LAD, LIMA to LAD not visualized but assumed patent given myoview finding, occluded SVG to diagonal, 99% mid RCA tx w/ SYNERGY DES 3X28 mm  . Constipation   . Diabetes mellitus type 2, insulin dependent ([place])   . Epistaxis, recurrent Feb 2017  . GERD (gastroesophageal reflux disease)   . HTN (hypertension)   . Hyperlipemia   . Kidney stones   . Mild aortic stenosis   . Rib fractures    left rib fractures being treated with pain medications  . Stented coronary artery Nov 2016   RCA DES  . Urinary tract infection     Enterococcus    Past Surgical History:  Procedure Laterality Date  . CARDIAC CATHETERIZATION  1989  . CARDIAC CATHETERIZATION N/A 01/08/2015   Procedure: Left Heart Cath and Cors/Grafts Angiography;  Surgeon: Peter M Martinique, MD;  Location: [facility];  Service:  Cardiovascular;  Laterality: N/A;  . CARDIAC CATHETERIZATION  01/08/2015   Procedure: Coronary Stent Intervention;  Surgeon: Peter M Martinique, MD;  Location: [facility];  Service: Cardiovascular;;  . CARPAL TUNNEL RELEASE Right 08/2010  . CATARACT EXTRACTION W/ INTRAOCULAR LENS  IMPLANT, BILATERAL Bilateral   . CORONARY ANGIOPLASTY  01/08/15   RCA DES  . CORONARY ARTERY BYPASS GRAFT  1989   "CABG X 2"  . JOINT REPLACEMENT    . TONSILLECTOMY    . TOTAL HIP ARTHROPLASTY Right 2000    There were no vitals filed for this visit.      Subjective Assessment - 10/02/15 0805    Subjective "I felt so horrible when I left here, but by the time I got home, I felt better; and by 2:00 that afternoon, I felt completely back to normal."   Pertinent History Likes to be called "[name]". PMH significant for: HTN, HLD, CAD s/p CABG x2 (1990), acute renal failure, aortic stenosis, stented coronary artery   Currently in Pain? No/denies            [facility] PT Assessment - 10/02/15 0001      Functional Gait  Assessment   Gait assessed  Yes   Gait Level Surface Walks 20 ft in less than 7 sec but greater than 5.5 sec, uses assistive device, slower speed, mild  gait deviations, or deviates 6-10 in outside of the 12 in walkway width.  6.92 sec   Change in Gait Speed Able to change speed, demonstrates mild gait deviations, deviates 6-10 in outside of the 12 in walkway width, or no gait deviations, unable to achieve a major change in velocity, or uses a change in velocity, or uses an assistive device.   Gait with Horizontal Head Turns Performs head turns smoothly with slight change in gait velocity (eg, minor disruption to smooth gait path), deviates 6-10 in outside 12 in walkway width, or uses an assistive device.   Gait with Vertical Head Turns Performs head turns with no change in gait. Deviates no more than 6 in outside 12 in walkway width.   Gait and Pivot Turn Pivot turns safely within 3 sec and stops  quickly with no loss of balance.   Step Over Obstacle Is able to step over 2 stacked shoe boxes taped together (9 in total height) without changing gait speed. No evidence of imbalance.   Gait with Narrow Base of Support Ambulates less than 4 steps heel to toe or cannot perform without assistance.   Gait with Eyes Closed Walks 20 ft, slow speed, abnormal gait pattern, evidence for imbalance, deviates 10-15 in outside 12 in walkway width. Requires more than 9 sec to ambulate 20 ft.  9.1 sec   Ambulating Backwards Walks 20 ft, slow speed, abnormal gait pattern, evidence for imbalance, deviates 10-15 in outside 12 in walkway width.  posterior LOB with self-recovery; 11.63 sec   Steps Alternating feet, must use rail.   Total Score 19   FGA comment: Score suggests increased fall risk.            Vestibular Assessment - 10/02/15 0851      Positional Testing   Horizontal Canal Testing Horizontal Canal Right;Horizontal Canal Left     Horizontal Canal Right   Horizontal Canal Right Duration N/A  Modified to fast rolling due to limited c-spine ROM   Horizontal Canal Right Symptoms Normal     Horizontal Canal Left   Horizontal Canal Left Duration Ageotropic nystagmus x3 beats but asymptomatic  Modified to fast rolling due to limited c-spine ROM   Horizontal Canal Left Symptoms Ageotrophic;Nystagmus                 [facility] Adult PT Treatment/Exercise - 10/02/15 0001      Ambulation/Gait   Ambulation/Gait Yes   Ambulation/Gait Assistance 6: Modified independent (Device/Increase time);5: Supervision   Ambulation/Gait Assistance Details Mod I for all linear gait without head turns; (S) for gait over unlevel surfaces with horizontal > vertical head turns.   Ambulation Distance (Feet) 800 Feet  x500' outdoors, x300' indoors   Assistive device None   Gait Pattern Step-through pattern  narrow BOS, staggering with horiz > vertical head movement   Ambulation Surface  Level;Unlevel;Indoor;Outdoor;Paved   Ramp 6: Modified independent (Device)   Curb 6: Modified independent (Device/increase time)         Vestibular Treatment/Exercise - 10/02/15 0001      Vestibular Treatment/Exercise   Vestibular Treatment Provided Gaze   Gaze Exercises X1 Viewing Horizontal     X1 Viewing Horizontal   Foot Position standing   Reps 1   Comments 45 seconds     X1 Viewing Vertical   Foot Position --  deferred due to bifocal lenses            Balance Exercises - 10/02/15 0849  Balance Exercises: Standing   Standing Eyes Closed Narrow base of support (BOS);Head turns;Foam/compliant surface;Other reps (comment)  1 pillow; horiz, vert, and diagonal turns x10 each   Retro Gait 2 reps;Other (comment)  2 x30', unlevel paved surface with (S)   Turning Right;5 reps;3 reps;Left  360* to L x3 w/o sx; to R x5 with 1/5 dizziness             PT Short Term Goals - 09/26/15 0749      PT SHORT TERM GOAL #1   Title STG's = LTG's           PT Long Term Goals - 09/26/15 1441      PT LONG TERM GOAL #1   Title Pt will independently perform HEP to maximize functional gains made in PT.   (Target date: 10/22/15)   Status On-going     PT LONG TERM GOAL #2   Title Positional vertigo testing will be negative to indicate resolved BPPV.  (10/22/15)   Baseline Met 7/28.   Status Achieved     PT LONG TERM GOAL #3   Title Assess strength and balance, if indicated, after BPPV resolves.  (10/22/15)   Baseline Met 7/28.   Status Achieved     PT LONG TERM GOAL #4   Title SOT composite score will improve from 36% to 44% to indicate significant improvement in multi-sensory input for balance. (10/22/15)   Status New     PT LONG TERM GOAL #5   Title SOT Visual score will improve from 38% to >60% to >/= 74% to indicate normalized use of visual input for balance.  (10/22/15)   Status New     Additional Long Term Goals   Additional Long Term Goals Yes     PT LONG  TERM GOAL #6   Title SOT Vestibular score will improve from 0% to >/= 25% to indicate improved used of vestibular input for balance.  (10/22/15)   Status New               Plan - 10/02/15 4665    Clinical Impression Statement Horizontal canal testing reveals ageotropic nystagmus x3 beats on L but asymptomatic; no nystagmus, asymptomatic on R. Recommending pt continue fast rolling for habituation x1 set per day until next session. Progressed current HEP due to pt progress. Completed FGA with score of 19/30, suggesting fall risk. Pt continues to report disequilibrium with 360-degree turning to R side and demonstrates decreased gait stability with horizontal head turns and retro gait.    Rehab Potential Good   Clinical Impairments Affecting Rehab Potential unable to rule out multi-canal BPPV on evaluation   PT Frequency 2x / week   PT Duration 4 weeks   PT Treatment/Interventions ADLs/Self Care Home Management;Gait training;Stair training;Neuromuscular re-education;Functional mobility training;Therapeutic activities;Patient/family education;Therapeutic exercise;Vestibular;Canalith Repostioning;Balance training   PT Next Visit Plan Reassess for [facility] BPPV to see if still ogeotropic nystagmus on L. Consider reassessing SOT.   PT Home Exercise Plan Consider adding 360-degree turns to R side, retro gait, and tandem gait at countertop.   Consulted and Agree with Plan of Care Patient      Patient will benefit from skilled therapeutic intervention in order to improve the following deficits and impairments:  Abnormal gait, Decreased balance, Dizziness  Visit Diagnosis: Other abnormalities of gait and mobility  Unsteadiness on feet  Dizziness and giddiness     Problem List Patient Active Problem List   Diagnosis Date Noted  . Epistaxis, recurrent 04/11/2015  .  CAD S/P PCI- Nov 2016   . Abnormal nuclear stress test 01/08/2015  . Vertigo, benign positional 06/25/2014  . Encounter for  therapeutic drug monitoring 04/13/2013  . Chronic anticoagulation 09/09/2011  . Chronic atrial fibrillation ([place]) 07/30/2011  . Hx of CABG x 2 1990   . HTN (hypertension)   . Diabetes mellitus type 2, insulin dependent ([place])   . Hyperlipemia   . BPH (benign prostatic hyperplasia)     Billie Ruddy, PT, DPT [facility] [address] [place], [place], [address] Phone: [phone]   Fax:  [phone] 10/02/15, 8:59 AM  Name: [name] MRN: [id number] Date of Birth: 1928-12-20

## 2015-10-02 NOTE — Telephone Encounter (Signed)
New message   Pt wife verbalized that she is returning call to rn for her husband

## 2015-10-03 ENCOUNTER — Ambulatory Visit (INDEPENDENT_AMBULATORY_CARE_PROVIDER_SITE_OTHER): Payer: Medicare Other | Admitting: Pharmacist

## 2015-10-03 DIAGNOSIS — I2510 Atherosclerotic heart disease of native coronary artery without angina pectoris: Secondary | ICD-10-CM

## 2015-10-03 DIAGNOSIS — I10 Essential (primary) hypertension: Secondary | ICD-10-CM

## 2015-10-03 DIAGNOSIS — Z7901 Long term (current) use of anticoagulants: Secondary | ICD-10-CM

## 2015-10-03 DIAGNOSIS — I4891 Unspecified atrial fibrillation: Secondary | ICD-10-CM

## 2015-10-03 DIAGNOSIS — Z5181 Encounter for therapeutic drug level monitoring: Secondary | ICD-10-CM

## 2015-10-03 LAB — POCT INR: INR: 2.4

## 2015-10-07 ENCOUNTER — Ambulatory Visit: Payer: Medicare Other | Admitting: Physical Therapy

## 2015-10-07 DIAGNOSIS — R2681 Unsteadiness on feet: Secondary | ICD-10-CM | POA: Diagnosis not present

## 2015-10-07 DIAGNOSIS — R2689 Other abnormalities of gait and mobility: Secondary | ICD-10-CM | POA: Diagnosis not present

## 2015-10-07 DIAGNOSIS — R42 Dizziness and giddiness: Secondary | ICD-10-CM | POA: Diagnosis not present

## 2015-10-07 NOTE — Patient Instructions (Signed)
[name],  You can stop doing the rolling exercise and the "A" exercise. Focus on the exercise below.  Feet Together (Compliant Surface) Head Motion - Eyes Closed    Stand with your back to a corner with a stable chair in front of you. Stand on 2 pillows (or a couch cushion) with feet together.Close eyes and move head slowly: up and down 10 times; and right to left 10 times; diagonally up-right to down-left 10 times; diagonally up-left to down-right 10 times. Repeat __2-3__ times per day.

## 2015-10-07 NOTE — Therapy (Signed)
[facility] [address] [place], [place], [address] Phone: [phone]   Fax:  [phone]  Physical Therapy Treatment  Patient Details  Name: [name] MRN: [id number] Date of Birth: September 23, 1928 Referring Provider: Billey Chang, MD  Encounter Date: 10/07/2015      PT End of Session - 10/07/15 1800    Visit Number 5   Number of Visits 9   Date for PT Re-Evaluation 10/24/15   Authorization Type Medicare Traditional primary; BCBS secondary   Authorization Time Period G Codes required   PT Start Time [phone]   PT Stop Time [phone]  Session ended early due to no further treatment indicated at this time.   PT Time Calculation (min) 33 min   Equipment Utilized During Treatment Other (comment)  Balance Master and harness   Activity Tolerance Patient tolerated treatment well   Behavior During Therapy WFL for tasks assessed/performed      Past Medical History:  Diagnosis Date  . Abdominal pain   . Acute renal failure ([place])     resolved  . Arthritis    "hands & legs" (11/'10/2014)  . Atrial fibrillation ([place])   . Bladder outlet obstruction   . Bladder outlet obstruction   . BPH (benign prostatic hyperplasia)   . CAD (coronary artery disease)    a. CABG IN 1989. b. 01/08/2015 CTO of ost LAD, LIMA to LAD not visualized but assumed patent given myoview finding, occluded SVG to diagonal, 99% mid RCA tx w/ SYNERGY DES 3X28 mm  . Constipation   . Diabetes mellitus type 2, insulin dependent ([place])   . Epistaxis, recurrent Feb 2017  . GERD (gastroesophageal reflux disease)   . HTN (hypertension)   . Hyperlipemia   . Kidney stones   . Mild aortic stenosis   . Rib fractures    left rib fractures being treated with pain medications  . Stented coronary artery Nov 2016   RCA DES  . Urinary tract infection     Enterococcus    Past Surgical History:  Procedure Laterality Date  . CARDIAC CATHETERIZATION  1989  . CARDIAC  CATHETERIZATION N/A 01/08/2015   Procedure: Left Heart Cath and Cors/Grafts Angiography;  Surgeon: Jeffrey M Martinique, MD;  Location: [facility];  Service: Cardiovascular;  Laterality: N/A;  . CARDIAC CATHETERIZATION  01/08/2015   Procedure: Coronary Stent Intervention;  Surgeon: Jeffrey M Martinique, MD;  Location: [facility];  Service: Cardiovascular;;  . CARPAL TUNNEL RELEASE Right 08/2010  . CATARACT EXTRACTION W/ INTRAOCULAR LENS  IMPLANT, BILATERAL Bilateral   . CORONARY ANGIOPLASTY  01/08/15   RCA DES  . CORONARY ARTERY BYPASS GRAFT  1989   "CABG X 2"  . JOINT REPLACEMENT    . TONSILLECTOMY    . TOTAL HIP ARTHROPLASTY Right 2000    There were no vitals filed for this visit.      Subjective Assessment - 10/07/15 1753    Subjective "No more dizzies.Marland KitchenMarland KitchenI'm feeling much better."   Pertinent History Likes to be called "[name]". PMH significant for: HTN, HLD, CAD s/p CABG x2 (1990), acute renal failure, aortic stenosis, stented coronary artery   Currently in Pain? No/denies                Vestibular Assessment - 10/07/15 0001      Horizontal Canal Right   Horizontal Canal Right Duration NA   Horizontal Canal Right Symptoms Normal     Horizontal Canal Left   Horizontal Canal  Left Duration NA   Horizontal Canal Left Symptoms Normal     Equities trader Comment Composite score = 43% (improved from 36%) compared to age/height * normative value of 63%%. Sensory analysis: Vestibular 0% (norm = 50%); Visual = 69% (norm = 74%). Findings suggest significant impairment in ability to use vestibular input for balance, mildly impaired ability to use visual input for balance.  6 "falls" sustained, 3 each during Conditions 5 and 6. Ankle strategy dominant during Conditions 5 and 6. COG alignment: to L of midline.*Note: findings compared with norms for 80 y/o subjects due to limited research on patients subjects > 3 y/o.                       Balance Exercises - 10/07/15 1756      Balance Exercises: Standing   Standing Eyes Closed Narrow base of support (BOS);Head turns;Foam/compliant surface;Other reps (comment)  2 pillows; vertical, horiz, diagonal x10 each           PT Education - 10/07/15 1754    Education provided Yes   Education Details SOT findings, progress. Modified HEP to corner balance only. Decreased PT frequency to 1x/week.    Person(s) Educated Patient   Methods Explanation;Handout;Verbal cues;Demonstration   Comprehension Verbalized understanding;Returned demonstration          PT Short Term Goals - 09/26/15 0749      PT SHORT TERM GOAL #1   Title STG's = LTG's           PT Long Term Goals - 10/07/15 1755      PT LONG TERM GOAL #1   Title Pt will independently perform HEP to maximize functional gains made in PT.   (Target date: 10/22/15)   Baseline Met 8/8.   Status Achieved     PT LONG TERM GOAL #2   Title Positional vertigo testing will be negative to indicate resolved BPPV.  (10/22/15)   Baseline Met 7/28.   Status Achieved     PT LONG TERM GOAL #3   Title Assess strength and balance, if indicated, after BPPV resolves.  (10/22/15)   Baseline Met 7/28.   Status Achieved     PT LONG TERM GOAL #4   Title SOT composite score will improve from 36% to 44% to indicate significant improvement in multi-sensory input for balance. (10/22/15)   Baseline 8/8: SOT composite score = 43%   Status On-going     PT LONG TERM GOAL #5   Title SOT Visual score will improve from 38% to >/= 74% to indicate normalized use of visual input for balance.  (10/22/15)   Baseline 8/8: SOT Visual score = 69%   Status On-going     PT LONG TERM GOAL #6   Title SOT Vestibular score will improve from 0% to >/= 25% to indicate improved used of vestibular input for balance.  (10/22/15)   Status On-going               Plan - 10/07/15 1800    Clinical Impression Statement  Horizontal canal testing now (-) and asymptomatic. SOT composite score has improved from 36% to 43%; SOT visual score improved from 38 to 69; however, vestibular score remain at 0. Modified HEP to focus solely on corner balance with vestibular reliance and decreased PT frequency to 1x/week for remaining week.   Rehab Potential Good   Clinical Impairments Affecting Rehab Potential unable to rule  out multi-canal BPPV on evaluation   PT Frequency 1x / week   PT Duration 2 weeks   PT Treatment/Interventions ADLs/Self Care Home Management;Gait training;Stair training;Neuromuscular re-education;Functional mobility training;Therapeutic activities;Patient/family education;Therapeutic exercise;Vestibular;Canalith Repostioning;Balance training   PT Next Visit Plan Assess current vestib HEP (EC, narrow BOS on pillows x2); consider adding wall bumps with EC on pillow. Train hip strategy.   Consulted and Agree with Plan of Care Patient      Patient will benefit from skilled therapeutic intervention in order to improve the following deficits and impairments:  Abnormal gait, Decreased balance, Dizziness  Visit Diagnosis: Other abnormalities of gait and mobility  Unsteadiness on feet     Problem List Patient Active Problem List   Diagnosis Date Noted  . Epistaxis, recurrent 04/11/2015  . CAD S/P PCI- Nov 2016   . Abnormal nuclear stress test 01/08/2015  . Vertigo, benign positional 06/25/2014  . Encounter for therapeutic drug monitoring 04/13/2013  . Chronic anticoagulation 09/09/2011  . Chronic atrial fibrillation ([place]) 07/30/2011  . Hx of CABG x 2 1990   . HTN (hypertension)   . Diabetes mellitus type 2, insulin dependent ([place])   . Hyperlipemia   . BPH (benign prostatic hyperplasia)     Billie Ruddy, PT, DPT [facility] [address] [place], [place], [address] Phone: [phone]   Fax:  [phone] 10/07/15, 6:04 PM  Name: [name] MRN: [id number] Date of Birth: [birth year]

## 2015-10-10 ENCOUNTER — Encounter: Payer: Medicare Other | Admitting: Physical Therapy

## 2015-10-14 ENCOUNTER — Encounter: Payer: Medicare Other | Admitting: Physical Therapy

## 2015-10-16 ENCOUNTER — Ambulatory Visit: Payer: Medicare Other | Admitting: Physical Therapy

## 2015-10-16 DIAGNOSIS — R2681 Unsteadiness on feet: Secondary | ICD-10-CM

## 2015-10-16 DIAGNOSIS — R42 Dizziness and giddiness: Secondary | ICD-10-CM

## 2015-10-16 DIAGNOSIS — R2689 Other abnormalities of gait and mobility: Secondary | ICD-10-CM

## 2015-10-16 NOTE — Therapy (Signed)
[facility] [address] [place], [place], [address] Phone: [phone]   Fax:  [phone]  Physical Therapy Treatment  Patient Details  Name: [name] MRN: [id number] Date of Birth: 02-22-1929 Referring Provider: Billey Chang, MD  Encounter Date: 10/16/2015      PT End of Session - 10/16/15 0940    Visit Number 6   Number of Visits 9   Date for PT Re-Evaluation 10/24/15   Authorization Type Medicare Traditional primary; BCBS secondary   Authorization Time Period G Codes required   PT Start Time 0845   PT Stop Time 0930   PT Time Calculation (min) 45 min   Activity Tolerance Patient tolerated treatment well   Behavior During Therapy [facility] for tasks assessed/performed      Past Medical History:  Diagnosis Date  . Abdominal pain   . Acute renal failure ([place])     resolved  . Arthritis    "hands & legs" (11/'10/2014)  . Atrial fibrillation ([place])   . Bladder outlet obstruction   . Bladder outlet obstruction   . BPH (benign prostatic hyperplasia)   . CAD (coronary artery disease)    a. CABG IN 1989. b. 01/08/2015 CTO of ost LAD, LIMA to LAD not visualized but assumed patent given myoview finding, occluded SVG to diagonal, 99% mid RCA tx w/ SYNERGY DES 3X28 mm  . Constipation   . Diabetes mellitus type 2, insulin dependent ([place])   . Epistaxis, recurrent Feb 2017  . GERD (gastroesophageal reflux disease)   . HTN (hypertension)   . Hyperlipemia   . Kidney stones   . Mild aortic stenosis   . Rib fractures    left rib fractures being treated with pain medications  . Stented coronary artery Nov 2016   RCA DES  . Urinary tract infection     Enterococcus    Past Surgical History:  Procedure Laterality Date  . CARDIAC CATHETERIZATION  1989  . CARDIAC CATHETERIZATION N/A 01/08/2015   Procedure: Left Heart Cath and Cors/Grafts Angiography;  Surgeon: Peter M Martinique, MD;  Location: [facility];  Service:  Cardiovascular;  Laterality: N/A;  . CARDIAC CATHETERIZATION  01/08/2015   Procedure: Coronary Stent Intervention;  Surgeon: Peter M Martinique, MD;  Location: [facility];  Service: Cardiovascular;;  . CARPAL TUNNEL RELEASE Right 08/2010  . CATARACT EXTRACTION W/ INTRAOCULAR LENS  IMPLANT, BILATERAL Bilateral   . CORONARY ANGIOPLASTY  01/08/15   RCA DES  . CORONARY ARTERY BYPASS GRAFT  1989   "CABG X 2"  . JOINT REPLACEMENT    . TONSILLECTOMY    . TOTAL HIP ARTHROPLASTY Right 2000    There were no vitals filed for this visit.      Subjective Assessment - 10/16/15 0850    Subjective "I tried the exercise with the two pillows. I don't know what happened; but the next thing I knew, I was on the floor and my wife had to help me up. I have no idea what happened." Pt doesn't think he hit his head, but is unsure. Pt did experience very transient vertiginous symptoms when getting into bed 1-2 days after the fall.    Pertinent History Likes to be called "[name]". PMH significant for: HTN, HLD, CAD s/p CABG x2 (1990), acute renal failure, aortic stenosis, stented coronary artery   Currently in Pain? No/denies                Vestibular Assessment - 10/16/15 0001  Positional Testing   Sidelying Test Sidelying Right;Sidelying Left   Horizontal Canal Testing Horizontal Canal Right;Horizontal Canal Left     Sidelying Right   Sidelying Right Duration Mild dizziness < 10 seconds; no nystagmus   Sidelying Right Symptoms No nystagmus     Sidelying Left   Sidelying Left Duration NA   Sidelying Left Symptoms No nystagmus     Horizontal Canal Right   Horizontal Canal Right Duration NA  modified to fast rolling due to limited cervical spine ROM   Horizontal Canal Right Symptoms Normal     Horizontal Canal Left   Horizontal Canal Left Duration NA  modified to fast rolling due to limited cervical spine ROM   Horizontal Canal Left Symptoms Normal                   Vestibular Treatment/Exercise - 10/16/15 0001      Vestibular Treatment/Exercise   Vestibular Treatment Provided Habituation   Habituation Exercises Laruth Bouchard Daroff;Horizontal Roll     Nestor Lewandowsky   Number of Reps  2   Symptom Description  Reviewed technique; initially required cueing for head placement. Pt gave effective return demo of cueing.     Horizontal Roll   Number of Reps  1   Symptom Description  Reviewed technique with effective between-session carryover.            Balance Exercises - 10/16/15 0934      Balance Exercises: Standing   Standing Eyes Opened Wide (BOA);Foam/compliant surface;Head turns;Other reps (comment)  simulated couch cushion; horiz, vert, diagonal turns x10 ea   Standing Eyes Closed Wide (BOA);Head turns;Foam/compliant surface;Other reps (comment)  simulated couch cushion; horiz, vert, diagonal turns x10 ea   Wall Bumps Hip   Wall Bumps-Hips Eyes opened;Eyes closed;Anterior/posterior;Foam/compliant surface;10 reps;Other (comment)  x10 reps each: solid w/ EO, pillow w/ EO, pillow w/ EC   Other Standing Exercises When pt demonstrated corner balance HEP (narrow BOS, 2 pillows, EC with head turns) pt demonstrates minimall postural sway and no overt LOB. However, modified exercise to wide BOS due to pt-reported fall when performing HEP. Also recommended wife provide supervision during all exercises with EC.           PT Education - 10/16/15 0932    Education provided Yes   Education Details HEP modified; see Pt Instructions. Recommended wife provide close supervision with all balance exercises with eyes closed due to recent pt-reported fall during corner balance exercises.   Person(s) Educated Patient   Methods Explanation;Demonstration;Verbal cues;Handout   Comprehension Verbalized understanding;Returned demonstration          PT Short Term Goals - 09/26/15 0749      PT SHORT TERM GOAL #1   Title STG's = LTG's           PT Long  Term Goals - 10/07/15 1755      PT LONG TERM GOAL #1   Title Pt will independently perform HEP to maximize functional gains made in PT.   (Target date: 10/22/15)   Baseline Met 8/8.   Status Achieved     PT LONG TERM GOAL #2   Title Positional vertigo testing will be negative to indicate resolved BPPV.  (10/22/15)   Baseline Met 7/28.   Status Achieved     PT LONG TERM GOAL #3   Title Assess strength and balance, if indicated, after BPPV resolves.  (10/22/15)   Baseline Met 7/28.   Status Achieved     PT LONG TERM  GOAL #4   Title SOT composite score will improve from 36% to 44% to indicate significant improvement in multi-sensory input for balance. (10/22/15)   Baseline 8/8: SOT composite score = 43%   Status On-going     PT LONG TERM GOAL #5   Title SOT Visual score will improve from 38% to >/= 74% to indicate normalized use of visual input for balance.  (10/22/15)   Baseline 8/8: SOT Visual score = 69%   Status On-going     PT LONG TERM GOAL #6   Title SOT Vestibular score will improve from 0% to >/= 25% to indicate improved used of vestibular input for balance.  (10/22/15)   Status On-going               Plan - 10/16/15 0940    Clinical Impression Statement Pt reports fall sustained when pt performing corner balance exercises at home. Pt unsure what happened, unsure if he sustained LOB. Reports wife found him in floor after pt performing corner balance. Pt did experience room-spinning dizziness 1-2 days after fall when getting OOB, so pt starting performing horizontal rolling for habituation until dizziness cleared. During this session, pt reports motion sensitivity (no true vertigo) in R sidelying test position. L PC and B HC canal testing (-) and asymptomatic. Provided education on appropriate situations in which to perform habituation (as pt did) and when to seek immediate medical attention. When demonstrating corner balance HEP (EC on couch cushion, narrow BOS with head  turns), pt demonstrated minimal postural sway. Downgraded exercise to wide BOS and advised pt to have close supervision of wife when performing all eyes closed exercises at home.    Rehab Potential Good   Clinical Impairments Affecting Rehab Potential unable to rule out multi-canal BPPV on evaluation   PT Frequency 1x / week   PT Duration 2 weeks   PT Treatment/Interventions ADLs/Self Care Home Management;Gait training;Stair training;Neuromuscular re-education;Functional mobility training;Therapeutic activities;Patient/family education;Therapeutic exercise;Vestibular;Canalith Repostioning;Balance training   PT Next Visit Plan Check remaining goals and DC. GCODES, FOTO [facility]), and ensure pt independent with management of episodic vertigo.   Consulted and Agree with Plan of Care Patient      Patient will benefit from skilled therapeutic intervention in order to improve the following deficits and impairments:  Abnormal gait, Decreased balance, Dizziness  Visit Diagnosis: Other abnormalities of gait and mobility  Unsteadiness on feet  Dizziness and giddiness     Problem List Patient Active Problem List   Diagnosis Date Noted  . Epistaxis, recurrent 04/11/2015  . CAD S/P PCI- Nov 2016   . Abnormal nuclear stress test 01/08/2015  . Vertigo, benign positional 06/25/2014  . Encounter for therapeutic drug monitoring 04/13/2013  . Chronic anticoagulation 09/09/2011  . Chronic atrial fibrillation ([place]) 07/30/2011  . Hx of CABG x 2 1990   . HTN (hypertension)   . Diabetes mellitus type 2, insulin dependent ([place])   . Hyperlipemia   . BPH (benign prostatic hyperplasia)     Billie Ruddy, PT, DPT [facility] [address] [place], [place], [address] Phone: [phone]   Fax:  [phone] 10/16/15, 9:48 AM  Name: Jyles Sontag MRN: [id number] Date of Birth: 1928-11-14

## 2015-10-16 NOTE — Patient Instructions (Addendum)
Feet Apart (Compliant Surface) Head Motion - Eyes Open    Stand on a couch cushion with your feet apart while performing the following exercises:  1. With eyes open, move head slowly: up/down, right/left, diagonally up-right to down-left; and diagonally from up-left to down-right 10 times each.  2. With eyes closed, move head slowly: up/down, right/left, diagonally up-right to down-left; and diagonally from up-left to down-right 10 times each. *Have your wife stand next to you the first 3-4 times you do this exercise.  Do __2__ sessions per day.   Weight Shift: Anterior / Posterior (Righting / Equilibrium)    BEGIN WITH BACK LEANING AGAINST THE WALL AND FEET 4 INCHES AWAY. - Place a chair in front of you. Stand on 1 small pillow. Move your hips off the wall and come to upright standing.  Hold for 5 seconds. Return slowly to the wall letting your hips bump the wall and return to stand.   Hold each position __5__ seconds. Repeat _5__ times with eyes open, and _5__ times with eyes closed. Do __2-3_ sessions per day. *Have your wife stand next to you the first 3-4 times you do this exercise.    [name], You can try the exercises below if you feel like you have positional vertigo in the future. If you ever experience vertigo that is constant, occurs at rest, or does not change with head/body movement (getting in and out of bed), seek immediate medical attention.   Tip Card 1.The goal of habituation training is to assist in decreasing symptoms of vertigo, dizziness, or nausea provoked by specific head and body motions. 2.These exercises may initially increase symptoms; however, be persistent and work through symptoms. With repetition and time, the exercises will assist in reducing or eliminating symptoms. 3.Exercises should be stopped and discussed with the therapist if you experience any of the following: - Sudden change or fluctuation in hearing - New onset of ringing in the ears, or  increase in current intensity - Any fluid discharge from the ear - Severe pain in neck or back - Extreme nausea  Copyright  VHI. All rights reserved.  Rolling   With pillow under head, start on back. Roll to your right side.  Hold until dizziness stops, plus 20 seconds and then roll to the left side.  Hold until dizziness stops, plus 20 seconds.  Repeat sequence 5 times per session. Do 2 sessions per day.  Copyright  VHI. All rights reserved.  Sit to Side-Lying   Sit on edge of bed. Lie down onto the right side and hold until dizziness stops, plus 20 seconds.  Return to sitting and wait until dizziness stops, plus 20 seconds.  Repeat to the left side. Repeat sequence 5 times per session. Do 2 sessions per day.  Copyright  VHI. All rights reserved.

## 2015-10-21 ENCOUNTER — Encounter: Payer: Medicare Other | Admitting: Physical Therapy

## 2015-10-23 ENCOUNTER — Ambulatory Visit: Payer: Medicare Other | Admitting: Physical Therapy

## 2015-10-23 DIAGNOSIS — R2689 Other abnormalities of gait and mobility: Secondary | ICD-10-CM | POA: Diagnosis not present

## 2015-10-23 DIAGNOSIS — R2681 Unsteadiness on feet: Secondary | ICD-10-CM

## 2015-10-23 DIAGNOSIS — R42 Dizziness and giddiness: Secondary | ICD-10-CM

## 2015-10-23 NOTE — Therapy (Addendum)
[facility] [address] [place], [place], [address] Phone: [phone]   Fax:  [phone]  Physical Therapy Treatment  and Discharge Summary   Patient Details  Name: [name] MRN: [id number] Date of Birth: 1928/10/21 Referring Provider: Billey Chang, MD  Encounter Date: 10/23/2015      PT End of Session - 10/23/15 1731    Visit Number 7   Number of Visits 9   Date for PT Re-Evaluation 10/24/15   Authorization Type Medicare Traditional primary; BCBS secondary   Authorization Time Period G Codes required   PT Start Time 0928   PT Stop Time 1017   PT Time Calculation (min) 49 min   Equipment Utilized During Treatment Other (comment)  Balance Master and harness   Activity Tolerance Patient tolerated treatment well   Behavior During Therapy [facility] for tasks assessed/performed      Past Medical History:  Diagnosis Date  . Abdominal pain   . Acute renal failure ([place])     resolved  . Arthritis    "hands & legs" (11/'10/2014)  . Atrial fibrillation ([place])   . Bladder outlet obstruction   . Bladder outlet obstruction   . BPH (benign prostatic hyperplasia)   . CAD (coronary artery disease)    a. CABG IN 1989. b. 01/08/2015 CTO of ost LAD, LIMA to LAD not visualized but assumed patent given myoview finding, occluded SVG to diagonal, 99% mid RCA tx w/ SYNERGY DES 3X28 mm  . Constipation   . Diabetes mellitus type 2, insulin dependent ([place])   . Epistaxis, recurrent Feb 2017  . GERD (gastroesophageal reflux disease)   . HTN (hypertension)   . Hyperlipemia   . Kidney stones   . Mild aortic stenosis   . Rib fractures    left rib fractures being treated with pain medications  . Stented coronary artery Nov 2016   RCA DES  . Urinary tract infection     Enterococcus    Past Surgical History:  Procedure Laterality Date  . CARDIAC CATHETERIZATION  1989  . CARDIAC CATHETERIZATION N/A 01/08/2015   Procedure: Left  Heart Cath and Cors/Grafts Angiography;  Surgeon: Peter M Martinique, MD;  Location: [facility];  Service: Cardiovascular;  Laterality: N/A;  . CARDIAC CATHETERIZATION  01/08/2015   Procedure: Coronary Stent Intervention;  Surgeon: Peter M Martinique, MD;  Location: [facility];  Service: Cardiovascular;;  . CARPAL TUNNEL RELEASE Right 08/2010  . CATARACT EXTRACTION W/ INTRAOCULAR LENS  IMPLANT, BILATERAL Bilateral   . CORONARY ANGIOPLASTY  01/08/15   RCA DES  . CORONARY ARTERY BYPASS GRAFT  1989   "CABG X 2"  . JOINT REPLACEMENT    . TONSILLECTOMY    . TOTAL HIP ARTHROPLASTY Right 2000    There were no vitals filed for this visit.      Subjective Assessment - 10/23/15 0931    Subjective Pt reports having experienced room-spinning and nausea on Sunday night. Pt reports that vertigo was not provoked by exercises (fast rolling). Pt has not experienced dizziness/nausea since.    Pertinent History Likes to be called "[name]". PMH significant for: HTN, HLD, CAD s/p CABG x2 (1990), acute renal failure, aortic stenosis, stented coronary artery   Currently in Pain? No/denies                Vestibular Assessment - 10/23/15 0001      Positional Testing   Dix-Hallpike Dix-Hallpike Right;Dix-Hallpike Left   Sidelying Test Sidelying Right;Sidelying  Left   Horizontal Canal Testing Horizontal Canal Right;Horizontal Canal Left     Dix-Hallpike Right   Dix-Hallpike Right Duration NA   Dix-Hallpike Right Symptoms No nystagmus     Dix-Hallpike Left   Dix-Hallpike Left Duration NA   Dix-Hallpike Left Symptoms No nystagmus     Sidelying Right   Sidelying Right Duration NA   Sidelying Right Symptoms No nystagmus     Sidelying Left   Sidelying Left Duration NA   Sidelying Left Symptoms No nystagmus     Horizontal Canal Right   Horizontal Canal Right Duration NA   Horizontal Canal Right Symptoms Normal     Horizontal Canal Left   Horizontal Canal Left Duration NA   Horizontal  Canal Left Symptoms Normal     Equities trader Comment Composite score = 46% (improved from 43%) compared to age/height * normative value of 63%. Sensory analysis: Vestibular 0% (norm = 50%); Visual = 82% (norm = 74%). Findings suggest significant impairment in ability to use vestibular input for balance. Pt ability to use visual and somatosensory input for balance are now both WNL for age/height.  6 "falls" sustained, 3 each during Conditions 5 and 6. Ankle strategy dominant during Conditions 5 and 6. COG alignment: to L of midline.*Note: findings compared with norms for 80 y/o subjects due to limited research on patients subjects > 86 y/o.     Orthostatics   BP supine (x 5 minutes) 155/83  2/10 lightheadedness   HR supine (x 5 minutes) 82   BP standing (after 1 minute) 128/68  2/10 lightheadedness   HR standing (after 1 minute) 89   BP standing (after 3 minutes) 123/69  2/10 lightheadedness   HR standing (after 3 minutes) 89                  Vestibular Treatment/Exercise - 10/23/15 0001      Vestibular Treatment/Exercise   Vestibular Treatment Provided Gaze     X1 Viewing Horizontal   Foot Position standing; shoulder width   Reps 1   Comments 30 seconds            Balance Exercises - 10/23/15 1729      Balance Exercises: Standing   Standing Eyes Closed Wide (BOA);Head turns;Foam/compliant surface;Other reps (comment)  simulated couch cushion; horiz, vert, diagonal turns x10 ea           PT Education - 10/23/15 1727    Education provided Yes   Education Details Recommended pt perform HEP (emphasis on gaze stabilization). If pt asymptomatic (no further spinning episodes) until next scheduled session 2 weeks from today, recommend pt cancel appt and DC from PT.   Person(s) Educated Patient   Methods Explanation;Demonstration;Handout;Verbal cues   Comprehension Verbalized understanding;Returned  demonstration          PT Short Term Goals - 09/26/15 0749      PT SHORT TERM GOAL #1   Title STG's = LTG's           PT Long Term Goals - 10/07/15 1755      PT LONG TERM GOAL #1   Title Pt will independently perform HEP to maximize functional gains made in PT.   (Target date: 10/22/15)   Baseline Met 8/8.   Status Achieved     PT LONG TERM GOAL #2   Title Positional vertigo testing will be negative to indicate resolved BPPV.  (10/22/15)   Baseline Met 7/28.  Status Achieved     PT LONG TERM GOAL #3   Title Assess strength and balance, if indicated, after BPPV resolves.  (10/22/15)   Baseline Met 7/28.   Status Achieved     PT LONG TERM GOAL #4   Title SOT composite score will improve from 36% to 44% to indicate significant improvement in multi-sensory input for balance. (10/22/15)   Baseline 8/8: SOT composite score = 43%   Status On-going     PT LONG TERM GOAL #5   Title SOT Visual score will improve from 38% to >/= 74% to indicate normalized use of visual input for balance.  (10/22/15)   Baseline 8/8: SOT Visual score = 69%   Status On-going     PT LONG TERM GOAL #6   Title SOT Vestibular score will improve from 0% to >/= 25% to indicate improved used of vestibular input for balance.  (10/22/15)   Status On-going               Plan - 10/23/15 1732    Clinical Impression Statement Pt arrived to session reporting another episode of room spinning dizziness accompanied by nausea this past week. Unable to provoke said symptoms during this session. All positional testing (-) and asymptomatic. Othostatic vital signs remarkable for SBP and DBP change (supine > standing) but asymptomatic. SOT reveals improved use of visual input for balance (now WNL) but Vestibular score still 0%. Suspect pt difficulty using vestibular system for balance is due to episodic vertigo (at least 3 occurrences in past 6 months). Modified HEP to add gaze stabilization. Scheduled one additional  PT session in 2 weeks. Instructed pt to cancel if any further symptoms continue.    Rehab Potential Good   Clinical Impairments Affecting Rehab Potential unable to rule out multi-canal BPPV on evaluation   PT Frequency 1x / week   PT Duration 8 weeks   PT Treatment/Interventions ADLs/Self Care Home Management;Gait training;Stair training;Neuromuscular re-education;Functional mobility training;Therapeutic activities;Patient/family education;Therapeutic exercise;Vestibular;Canalith Repostioning;Balance training   PT Next Visit Plan If pt returns to PT, recert and address symptoms/function prn. 10th visit GCODES and PN.   Consulted and Agree with Plan of Care Patient      Patient will benefit from skilled therapeutic intervention in order to improve the following deficits and impairments:  Abnormal gait, Decreased balance, Dizziness  Visit Diagnosis: Other abnormalities of gait and mobility  Unsteadiness on feet  Dizziness and giddiness     Problem List Patient Active Problem List   Diagnosis Date Noted  . Epistaxis, recurrent 04/11/2015  . CAD S/P PCI- Nov 2016   . Abnormal nuclear stress test 01/08/2015  . Vertigo, benign positional 06/25/2014  . Encounter for therapeutic drug monitoring 04/13/2013  . Chronic anticoagulation 09/09/2011  . Chronic atrial fibrillation ([place]) 07/30/2011  . Hx of CABG x 2 1990   . HTN (hypertension)   . Diabetes mellitus type 2, insulin dependent ([place])   . Hyperlipemia   . BPH (benign prostatic hyperplasia)     Billie Ruddy, PT, DPT [facility] [address] [place], [place], [address] Phone: [phone]   Fax:  [phone] 10/23/15, 5:38 PM  Name: [name] MRN: [id number] Date of Birth: 06/09/1928   PHYSICAL THERAPY DISCHARGE SUMMARY  Visits from Start of Care: 7  Current functional level related to goals / functional outcomes: Unknown, as patient did not return to PT after initial 7  sessions.   Remaining deficits: Unknown, as patient did not return  to PT after initial 7 sessions.    Education / Equipment: See above.  Plan:                                                    Patient goals were partially met. Patient is being discharged due to not returning since the last visit.  ?????        Billie Ruddy, PT, DPT [facility] [address] [place], [place], [address] Phone: [phone]   Fax:  [phone] 01/21/16, 10:59 AM

## 2015-10-23 NOTE — Patient Instructions (Signed)
[name], perform the exercises below every day until the next time you come to PT. If you don't experience any further room-spinning episodes between now and next PT appt (2 weeks from now), you may call and cancel the appt.  Feet Apart (Compliant Surface) Head Motion - Eyes Open    Stand on a couch cushion with your feet apart and eyes closed, move head slowly: up/down, right/left, diagonally up-right to down-left; and diagonally from up-left to down-right 10 times each.   Copyright  VHI. All rights reserved.  Gaze Stabilization: Standing Feet Apart   Feet shoulder width apart, keeping eyes on target on wall 3 feet away, tilt head down slightly and move head side to side for 30 seconds. Do 2-3 sessions per day.  Gaze Stabilization: Tip Card 1.Target must remain in focus, not blurry, and appear stationary while head is in motion. 2.Perform exercises with small head movements (45 to either side of midline). 3.Increase speed of head motion so long as target is in focus. 4.If you wear eyeglasses, be sure you can see target through lens (therapist will give specific instructions for bifocal / progressive lenses). 5.These exercises may provoke dizziness or nausea. Work through these symptoms. If too dizzy, slow head movement slightly. Rest between each exercise. 6.Exercises demand concentration; avoid distractions. 7.For safety, perform standing exercises close to a counter, wall, corner, or next to someone.  Copyright  VHI. All rights reserved.

## 2015-10-24 ENCOUNTER — Other Ambulatory Visit: Payer: Self-pay | Admitting: Pharmacist Clinician (PhC)/ Clinical Pharmacy Specialist

## 2015-10-24 MED ORDER — WARFARIN SODIUM 5 MG PO TABS: ORAL_TABLET | ORAL | 1 refills | Status: DC

## 2015-11-07 ENCOUNTER — Encounter: Payer: Self-pay | Admitting: Physical Therapy

## 2015-11-13 ENCOUNTER — Ambulatory Visit (INDEPENDENT_AMBULATORY_CARE_PROVIDER_SITE_OTHER): Payer: Medicare Other | Admitting: Podiatry

## 2015-11-13 ENCOUNTER — Encounter: Payer: Self-pay | Admitting: Podiatry

## 2015-11-13 VITALS — Ht 66.0 in | Wt 217.0 lb

## 2015-11-13 DIAGNOSIS — M79676 Pain in unspecified toe(s): Secondary | ICD-10-CM

## 2015-11-13 DIAGNOSIS — B351 Tinea unguium: Secondary | ICD-10-CM | POA: Diagnosis not present

## 2015-11-13 DIAGNOSIS — M79675 Pain in left toe(s): Secondary | ICD-10-CM

## 2015-11-13 DIAGNOSIS — E119 Type 2 diabetes mellitus without complications: Secondary | ICD-10-CM | POA: Diagnosis not present

## 2015-11-13 NOTE — Progress Notes (Signed)
Patient ID: [name], male   DOB: 10/12/1928, 82 y.o.   MRN: [id number] Complaint:  Visit Type: Patient returns to my office for continued preventative foot care services. Complaint: Patient states" my nails have grown long and thick and become painful to walk and wear shoes" Patient has been diagnosed with DM with no foot  complications. He presents for preventative foot care services. No changes to ROS  Podiatric Exam: Vascular: dorsalis pedis and posterior tibial pulses are palpable bilateral. Capillary return is immediate. Temperature gradient is WNL. Skin turgor WNL  Sensorium: Normal Semmes Weinstein monofilament test. Normal tactile sensation bilaterally. Nail Exam: Pt has thick disfigured discolored nails with subungual debris noted bilateral entire nail hallux through fifth toenails Ulcer Exam: There is no evidence of ulcer or pre-ulcerative changes or infection. Orthopedic Exam: Muscle tone and strength are WNL. No limitations in general ROM. No crepitus or effusions noted. Foot type and digits show no abnormalities. Bony prominences are unremarkable. Skin: No Porokeratosis. No infection or ulcers  Diagnosis:  Tinea unguium, Pain in right toe, pain in left toes  Treatment & Plan Procedures and Treatment: Consent by patient was obtained for treatment procedures. The patient understood the discussion of treatment and procedures well. All questions were answered thoroughly reviewed. Debridement of mycotic and hypertrophic toenails, 1 through 5 bilateral and clearing of subungual debris. No ulceration, no infection noted.  Return Visit-Office Procedure: Patient instructed to return to the office for a follow up visit 3 months for continued evaluation and treatment.    Lejuan Botto DPM 

## 2015-11-14 ENCOUNTER — Ambulatory Visit (INDEPENDENT_AMBULATORY_CARE_PROVIDER_SITE_OTHER): Payer: Medicare Other | Admitting: Pharmacist Clinician (PhC)/ Clinical Pharmacy Specialist

## 2015-11-14 DIAGNOSIS — I2510 Atherosclerotic heart disease of native coronary artery without angina pectoris: Secondary | ICD-10-CM

## 2015-11-14 DIAGNOSIS — I4891 Unspecified atrial fibrillation: Secondary | ICD-10-CM

## 2015-11-14 DIAGNOSIS — I10 Essential (primary) hypertension: Secondary | ICD-10-CM

## 2015-11-14 DIAGNOSIS — Z5181 Encounter for therapeutic drug level monitoring: Secondary | ICD-10-CM

## 2015-11-14 DIAGNOSIS — Z7901 Long term (current) use of anticoagulants: Secondary | ICD-10-CM

## 2015-11-14 LAB — POCT INR: INR: 2.6

## 2015-11-18 DIAGNOSIS — I482 Chronic atrial fibrillation: Secondary | ICD-10-CM | POA: Diagnosis not present

## 2015-11-18 DIAGNOSIS — Z23 Encounter for immunization: Secondary | ICD-10-CM | POA: Diagnosis not present

## 2015-11-18 DIAGNOSIS — R42 Dizziness and giddiness: Secondary | ICD-10-CM | POA: Diagnosis not present

## 2015-11-18 DIAGNOSIS — I2510 Atherosclerotic heart disease of native coronary artery without angina pectoris: Secondary | ICD-10-CM | POA: Diagnosis not present

## 2015-11-18 DIAGNOSIS — E1159 Type 2 diabetes mellitus with other circulatory complications: Secondary | ICD-10-CM | POA: Diagnosis not present

## 2015-11-18 DIAGNOSIS — I10 Essential (primary) hypertension: Secondary | ICD-10-CM | POA: Diagnosis not present

## 2015-11-18 DIAGNOSIS — Z794 Long term (current) use of insulin: Secondary | ICD-10-CM | POA: Diagnosis not present

## 2015-11-27 DIAGNOSIS — H40013 Open angle with borderline findings, low risk, bilateral: Secondary | ICD-10-CM | POA: Diagnosis not present

## 2015-12-15 ENCOUNTER — Other Ambulatory Visit: Payer: Self-pay | Admitting: Physician Assistant

## 2015-12-15 NOTE — Telephone Encounter (Signed)
REFILL 

## 2015-12-15 NOTE — Telephone Encounter (Signed)
Please review for refill. Thanks!  

## 2015-12-22 ENCOUNTER — Other Ambulatory Visit: Payer: Self-pay | Admitting: Physician Assistant

## 2015-12-22 NOTE — Telephone Encounter (Signed)
Please review for refill. Thanks!  

## 2015-12-25 DIAGNOSIS — R42 Dizziness and giddiness: Secondary | ICD-10-CM | POA: Diagnosis not present

## 2015-12-26 ENCOUNTER — Ambulatory Visit (INDEPENDENT_AMBULATORY_CARE_PROVIDER_SITE_OTHER): Payer: Medicare Other | Admitting: Pharmacist Clinician (PhC)/ Clinical Pharmacy Specialist

## 2015-12-26 DIAGNOSIS — I4891 Unspecified atrial fibrillation: Secondary | ICD-10-CM

## 2015-12-26 DIAGNOSIS — Z7901 Long term (current) use of anticoagulants: Secondary | ICD-10-CM | POA: Diagnosis not present

## 2015-12-26 DIAGNOSIS — I2510 Atherosclerotic heart disease of native coronary artery without angina pectoris: Secondary | ICD-10-CM | POA: Diagnosis not present

## 2015-12-26 DIAGNOSIS — I10 Essential (primary) hypertension: Secondary | ICD-10-CM | POA: Diagnosis not present

## 2015-12-26 DIAGNOSIS — Z5181 Encounter for therapeutic drug level monitoring: Secondary | ICD-10-CM

## 2015-12-26 LAB — POCT INR: INR: 2.2

## 2016-01-13 DIAGNOSIS — R42 Dizziness and giddiness: Secondary | ICD-10-CM | POA: Diagnosis not present

## 2016-01-13 DIAGNOSIS — H6121 Impacted cerumen, right ear: Secondary | ICD-10-CM | POA: Diagnosis not present

## 2016-01-17 NOTE — Progress Notes (Signed)
[name] Date of Birth: 11/11/1928   History of Present Illness: [name] is seen for followup atrial fibrillation and CAD.  He  Has a history of chronic atrial fibrillation managed with rate control and anticoagulation with coumadin. He also has a history of CAD s/p CABG in 1990. Echo in 2013 showed normal LV function. Very mild AS.  biatrial enlargement. In October 2016 he had an abnormal [place] which showed inferior wall ischemia. He underwent cardiac cath. This showed occlusion of the proximal LAD and a critical stenosis of the mid RCA. The SVG to diagonal was occluded. The LIMA to the LAD was patent. He had stenting of the mid RCA with a DES. He is on Plavix and Coumadin.   In January 2017 he had a severe nosebleed. He had packing but it did not stop. Subsequently had cautery by Dr. Redmond Baseman. No recurrent bleeding since then.   On prior visit his HR was poorly controlled and his Toprol dose was increased. On follow up today he denies any significant palpitations, chest pain, or shortness of breath. He has been monitoring his HR with exercise and typically  HR is well controlled.[name] He is still asymptomatic. He does note some lightheadedness if he gets up too quickly. He complains of tightness in his left leg and foot.   Current Outpatient Prescriptions on File Prior to Visit  Medication Sig Dispense Refill  . AVODART 0.5 MG capsule Take 0.5 mg by mouth every other day.     . clopidogrel (PLAVIX) 75 MG tablet TAKE 1 TABLET BY MOUTH EVERY DAY WITH BREAKFAST 90 tablet 0  . diltiazem (CARDIZEM CD) 360 MG 24 hr capsule Take 1 capsule (360 mg total) by mouth daily. 30 capsule 6  . glucose blood (PRECISION XTRA TEST STRIPS) test strip 1 each by Other route See admin instructions. Check blood sugar once daily    . hydrochlorothiazide (MICROZIDE) 12.5 MG capsule Take 1 capsule by mouth daily.  0  . insulin glargine (LANTUS) 100 UNIT/ML injection Inject 32 Units into the skin daily.      [name] lisinopril (PRINIVIL,ZESTRIL) 5 MG tablet TAKE 1 TABLET BY MOUTH EVERY DAY 90 tablet 1  . metFORMIN (GLUCOPHAGE) 1000 MG tablet Take 1 tablet by mouth 2 (two) times daily.    . metoprolol succinate (TOPROL-XL) 100 MG 24 hr tablet Take 1 tablet (100 mg total) by mouth 2 (two) times daily. 180 tablet 3  . nitroGLYCERIN (NITROSTAT) 0.4 MG SL tablet Place 0.4 mg under the tongue every 5 (five) minutes as needed for chest pain (x 3 doses).    . pantoprazole (PROTONIX) 40 MG tablet Take 1 tablet by mouth daily at Noon 90 tablet 3  . simvastatin (ZOCOR) 80 MG tablet Take 40 mg by mouth at bedtime.     . Tamsulosin HCl (FLOMAX) 0.4 MG CAPS Take 0.4 mg by mouth every other day.     . warfarin (COUMADIN) 5 MG tablet Take 1 to 1.5 tablets by mouth daily as directed by coumadin clinic 120 tablet 1   No current facility-administered medications on file prior to visit.     Allergies  Allergen Reactions  . Erythromycin Base   . Amoxicillin Other (See Comments)    Unknown  . Cefadroxil Other (See Comments)    Unknown  . Ciprofloxacin Other (See Comments)    Unknown  . Erythromycin Other (See Comments)    Upset stomach    Past Medical History:  Diagnosis Date  .  Abdominal pain   . Acute renal failure ([place])     resolved  . Arthritis    "hands & legs" (11/'10/2014)  . Atrial fibrillation ([place])   . Bladder outlet obstruction   . Bladder outlet obstruction   . BPH (benign prostatic hyperplasia)   . CAD (coronary artery disease)    a. CABG IN 1989. b. 01/08/2015 CTO of ost LAD, LIMA to LAD not visualized but assumed patent given myoview finding, occluded SVG to diagonal, 99% mid RCA tx w/ SYNERGY DES 3X28 mm  . Constipation   . Diabetes mellitus type 2, insulin dependent ([place])   . Epistaxis, recurrent Feb 2017  . GERD (gastroesophageal reflux disease)   . HTN (hypertension)   . Hyperlipemia   . Kidney stones   . Mild aortic stenosis   . Rib fractures    left rib fractures being treated  with pain medications  . Stented coronary artery Nov 2016   RCA DES  . Urinary tract infection     Enterococcus    Past Surgical History:  Procedure Laterality Date  . CARDIAC CATHETERIZATION  1989  . CARDIAC CATHETERIZATION N/A 01/08/2015   Procedure: Left Heart Cath and Cors/Grafts Angiography;  Surgeon: Yong Grieser M Martinique, MD;  Location: [facility];  Service: Cardiovascular;  Laterality: N/A;  . CARDIAC CATHETERIZATION  01/08/2015   Procedure: Coronary Stent Intervention;  Surgeon: Merrie Epler M Martinique, MD;  Location: [facility];  Service: Cardiovascular;;  . CARPAL TUNNEL RELEASE Right 08/2010  . CATARACT EXTRACTION W/ INTRAOCULAR LENS  IMPLANT, BILATERAL Bilateral   . CORONARY ANGIOPLASTY  01/08/15   RCA DES  . CORONARY ARTERY BYPASS GRAFT  1989   "CABG X 2"  . JOINT REPLACEMENT    . TONSILLECTOMY    . TOTAL HIP ARTHROPLASTY Right 2000    History  Smoking Status  . Former Smoker  . Packs/day: 3.00  . Years: 10.00  . Types: Cigarettes  . Quit date: 05/11/1958  Smokeless Tobacco  . Never Used    History  Alcohol Use No    Family History  Problem Relation Age of Onset  . Brain cancer Father   . Throat cancer Brother     Review of Systems: As noted in history of present illness.  All other systems were reviewed and are negative.  Physical Exam: BP 116/64 (BP Location: Left Arm, Patient Position: Sitting, Cuff Size: Normal)   Pulse 72   Ht 5\' 6"  (1.676 m)   Wt 224 lb 6.4 oz (101.8 kg)   BMI 36.22 kg/m  He is a pleasant elderly white male who is obese. HEENT exam is unremarkable. Sclera are clear. PERRLA. Oropharynx is clear. Neck is supple no JVD, adenopathy, thyromegaly, or bruits. Lungs are clear. Cardiac exam reveals a grade 1/6 systolic ejection murmur right upper sternal border. His pulse is irregular. Old median sternotomy scar. Abdomen is obese, soft, nontender. He has no edema. Feet are warm and dry. Pedal pulses are reduced.  He is alert and oriented  x3. Cranial nerves II through XII are intact. Skin is warm and dry.  LABORATORY DATA:  Lab Results  Component Value Date   WBC 9.9 03/29/2015   HGB 12.2 (L) 03/29/2015   HCT 35.3 (L) 03/29/2015   PLT 272 03/29/2015   GLUCOSE 211 (H) 03/29/2015   ALT 25 11/28/2010   AST 21 11/28/2010   NA 140 03/29/2015   K 3.8 03/29/2015   CL 102 03/29/2015   CREATININE 1.09  03/29/2015   BUN 15 03/29/2015   CO2 27 03/29/2015   PSA 106.83 Test Methodology: [employer] PSA (H) 11/09/2009   INR 2.0 01/19/2016   HGBA1C 8.2 (H) 11/28/2010   A1c dated 11/18/15 8.3%   Assessment / Plan: 1. Chronic atrial fibrillation. Rate control is now good on increased beta blocker. Continue Toprol XL to 100 mg bid.   On coumadin. Asymptomatic. Continue Cardizem. INR therapeutic today 2.0.   2. CAD s/p remote CABG 25 years ago. S/p DES of RCA 01/08/15. May discontinue Plavix now and stay on coumadin only.   3. Chronic anticoagulation- therapeutic.  4. HTN controlled.  5. DM type 2- on insulin  6. Hyperlipidemia.  7. Left leg tightness. Decreased pulse. Will check LE arterial dopplers.    I will follow up in 6 months.

## 2016-01-19 ENCOUNTER — Ambulatory Visit (INDEPENDENT_AMBULATORY_CARE_PROVIDER_SITE_OTHER): Payer: Medicare Other | Admitting: Pharmacist

## 2016-01-19 ENCOUNTER — Ambulatory Visit (INDEPENDENT_AMBULATORY_CARE_PROVIDER_SITE_OTHER): Payer: Medicare Other | Admitting: Cardiology

## 2016-01-19 ENCOUNTER — Encounter: Payer: Self-pay | Admitting: Cardiology

## 2016-01-19 VITALS — BP 116/64 | HR 72 | Ht 66.0 in | Wt 224.4 lb

## 2016-01-19 DIAGNOSIS — I482 Chronic atrial fibrillation, unspecified: Secondary | ICD-10-CM

## 2016-01-19 DIAGNOSIS — I10 Essential (primary) hypertension: Secondary | ICD-10-CM | POA: Diagnosis not present

## 2016-01-19 DIAGNOSIS — I2510 Atherosclerotic heart disease of native coronary artery without angina pectoris: Secondary | ICD-10-CM

## 2016-01-19 DIAGNOSIS — Z7901 Long term (current) use of anticoagulants: Secondary | ICD-10-CM | POA: Diagnosis not present

## 2016-01-19 DIAGNOSIS — I4891 Unspecified atrial fibrillation: Secondary | ICD-10-CM | POA: Diagnosis not present

## 2016-01-19 DIAGNOSIS — E7800 Pure hypercholesterolemia, unspecified: Secondary | ICD-10-CM | POA: Diagnosis not present

## 2016-01-19 DIAGNOSIS — Z9861 Coronary angioplasty status: Secondary | ICD-10-CM | POA: Diagnosis not present

## 2016-01-19 DIAGNOSIS — Z951 Presence of aortocoronary bypass graft: Secondary | ICD-10-CM

## 2016-01-19 DIAGNOSIS — Z5181 Encounter for therapeutic drug level monitoring: Secondary | ICD-10-CM

## 2016-01-19 LAB — POCT INR: INR: 2

## 2016-01-19 NOTE — Patient Instructions (Addendum)
Stop taking Plavix (clopidogrel)  Continue your other therapy  We will schedule you for dopplers or your legs.

## 2016-02-03 ENCOUNTER — Other Ambulatory Visit: Payer: Self-pay

## 2016-02-03 MED ORDER — LISINOPRIL 5 MG PO TABS
5.0000 mg | ORAL_TABLET | Freq: Every day | ORAL | 3 refills | Status: DC
Start: 2016-02-03 — End: 2016-06-03

## 2016-02-03 NOTE — Telephone Encounter (Signed)
Rx(s) sent to pharmacy electronically.  

## 2016-02-05 ENCOUNTER — Ambulatory Visit: Payer: Medicare Other | Admitting: Podiatry

## 2016-02-12 ENCOUNTER — Encounter: Payer: Self-pay | Admitting: Podiatry

## 2016-02-12 ENCOUNTER — Ambulatory Visit (INDEPENDENT_AMBULATORY_CARE_PROVIDER_SITE_OTHER): Payer: Medicare Other | Admitting: Podiatry

## 2016-02-12 VITALS — Ht 66.0 in | Wt 224.0 lb

## 2016-02-12 DIAGNOSIS — M79675 Pain in left toe(s): Secondary | ICD-10-CM | POA: Diagnosis not present

## 2016-02-12 DIAGNOSIS — B351 Tinea unguium: Secondary | ICD-10-CM | POA: Diagnosis not present

## 2016-02-12 DIAGNOSIS — E119 Type 2 diabetes mellitus without complications: Secondary | ICD-10-CM

## 2016-02-12 NOTE — Progress Notes (Signed)
Patient ID: [name], male   DOB: 09/24/1928, 82 y.o.   MRN: [id number] Complaint:  Visit Type: Patient returns to my office for continued preventative foot care services. Complaint: Patient states" my nails have grown long and thick and become painful to walk and wear shoes" Patient has been diagnosed with DM with no foot  complications. He presents for preventative foot care services. No changes to ROS  Podiatric Exam: Vascular: dorsalis pedis and posterior tibial pulses are palpable bilateral. Capillary return is immediate. Temperature gradient is WNL. Skin turgor WNL  Sensorium: Normal Semmes Weinstein monofilament test. Normal tactile sensation bilaterally. Nail Exam: Pt has thick disfigured discolored nails with subungual debris noted bilateral entire nail hallux through fifth toenails Ulcer Exam: There is no evidence of ulcer or pre-ulcerative changes or infection. Orthopedic Exam: Muscle tone and strength are WNL. No limitations in general ROM. No crepitus or effusions noted. Foot type and digits show no abnormalities. Bony prominences are unremarkable. Skin: No Porokeratosis. No infection or ulcers  Diagnosis:  Tinea unguium, Pain in right toe, pain in left toes  Treatment & Plan Procedures and Treatment: Consent by patient was obtained for treatment procedures. The patient understood the discussion of treatment and procedures well. All questions were answered thoroughly reviewed. Debridement of mycotic and hypertrophic toenails, 1 through 5 bilateral and clearing of subungual debris. No ulceration, no infection noted.  Return Visit-Office Procedure: Patient instructed to return to the office for a follow up visit 3 months for continued evaluation and treatment.    Halsey Persaud DPM 

## 2016-02-13 ENCOUNTER — Ambulatory Visit (HOSPITAL_COMMUNITY)
Admission: RE | Admit: 2016-02-13 | Discharge: 2016-02-13 | Disposition: A | Discharge status: Home / Self Care | Payer: Medicare Other | Source: Ambulatory Visit | Attending: Cardiology | Admitting: Cardiology

## 2016-02-13 ENCOUNTER — Other Ambulatory Visit: Payer: Self-pay | Admitting: Cardiology

## 2016-02-13 DIAGNOSIS — I10 Essential (primary) hypertension: Secondary | ICD-10-CM | POA: Insufficient documentation

## 2016-02-13 DIAGNOSIS — I482 Chronic atrial fibrillation, unspecified: Secondary | ICD-10-CM

## 2016-02-13 DIAGNOSIS — M79606 Pain in leg, unspecified: Secondary | ICD-10-CM

## 2016-02-13 DIAGNOSIS — Z951 Presence of aortocoronary bypass graft: Secondary | ICD-10-CM

## 2016-02-13 DIAGNOSIS — I2510 Atherosclerotic heart disease of native coronary artery without angina pectoris: Secondary | ICD-10-CM | POA: Insufficient documentation

## 2016-02-13 DIAGNOSIS — E7800 Pure hypercholesterolemia, unspecified: Secondary | ICD-10-CM | POA: Diagnosis not present

## 2016-02-13 DIAGNOSIS — Z9861 Coronary angioplasty status: Secondary | ICD-10-CM | POA: Insufficient documentation

## 2016-02-13 DIAGNOSIS — I739 Peripheral vascular disease, unspecified: Secondary | ICD-10-CM | POA: Diagnosis not present

## 2016-02-16 ENCOUNTER — Ambulatory Visit (INDEPENDENT_AMBULATORY_CARE_PROVIDER_SITE_OTHER): Payer: Medicare Other | Admitting: Pharmacist Clinician (PhC)/ Clinical Pharmacy Specialist

## 2016-02-16 DIAGNOSIS — Z7901 Long term (current) use of anticoagulants: Secondary | ICD-10-CM | POA: Diagnosis not present

## 2016-02-16 DIAGNOSIS — I4891 Unspecified atrial fibrillation: Secondary | ICD-10-CM

## 2016-02-16 DIAGNOSIS — I10 Essential (primary) hypertension: Secondary | ICD-10-CM

## 2016-02-16 DIAGNOSIS — I2510 Atherosclerotic heart disease of native coronary artery without angina pectoris: Secondary | ICD-10-CM

## 2016-02-16 DIAGNOSIS — Z5181 Encounter for therapeutic drug level monitoring: Secondary | ICD-10-CM

## 2016-02-16 LAB — POCT INR: INR: 2

## 2016-02-19 ENCOUNTER — Encounter (HOSPITAL_COMMUNITY): Payer: Medicare Other

## 2016-03-12 ENCOUNTER — Other Ambulatory Visit: Payer: Self-pay | Admitting: Physician Assistant

## 2016-03-12 NOTE — Telephone Encounter (Signed)
Refill Request.  

## 2016-03-22 ENCOUNTER — Ambulatory Visit (INDEPENDENT_AMBULATORY_CARE_PROVIDER_SITE_OTHER): Payer: Medicare Other | Admitting: Pharmacist

## 2016-03-22 DIAGNOSIS — I10 Essential (primary) hypertension: Secondary | ICD-10-CM

## 2016-03-22 DIAGNOSIS — Z7901 Long term (current) use of anticoagulants: Secondary | ICD-10-CM | POA: Diagnosis not present

## 2016-03-22 DIAGNOSIS — I4891 Unspecified atrial fibrillation: Secondary | ICD-10-CM

## 2016-03-22 DIAGNOSIS — I2510 Atherosclerotic heart disease of native coronary artery without angina pectoris: Secondary | ICD-10-CM

## 2016-03-22 DIAGNOSIS — Z5181 Encounter for therapeutic drug level monitoring: Secondary | ICD-10-CM

## 2016-03-22 LAB — POCT INR: INR: 2

## 2016-03-25 DIAGNOSIS — E1169 Type 2 diabetes mellitus with other specified complication: Secondary | ICD-10-CM | POA: Diagnosis not present

## 2016-03-25 DIAGNOSIS — E782 Mixed hyperlipidemia: Secondary | ICD-10-CM | POA: Diagnosis not present

## 2016-03-25 DIAGNOSIS — I10 Essential (primary) hypertension: Secondary | ICD-10-CM | POA: Diagnosis not present

## 2016-03-25 DIAGNOSIS — E1159 Type 2 diabetes mellitus with other circulatory complications: Secondary | ICD-10-CM | POA: Diagnosis not present

## 2016-03-25 DIAGNOSIS — Z0000 Encounter for general adult medical examination without abnormal findings: Secondary | ICD-10-CM | POA: Diagnosis not present

## 2016-03-25 DIAGNOSIS — I2510 Atherosclerotic heart disease of native coronary artery without angina pectoris: Secondary | ICD-10-CM | POA: Diagnosis not present

## 2016-03-25 DIAGNOSIS — Z794 Long term (current) use of insulin: Secondary | ICD-10-CM | POA: Diagnosis not present

## 2016-03-25 DIAGNOSIS — M7061 Trochanteric bursitis, right hip: Secondary | ICD-10-CM | POA: Diagnosis not present

## 2016-03-25 DIAGNOSIS — Z7901 Long term (current) use of anticoagulants: Secondary | ICD-10-CM | POA: Diagnosis not present

## 2016-04-05 DIAGNOSIS — I2510 Atherosclerotic heart disease of native coronary artery without angina pectoris: Secondary | ICD-10-CM | POA: Diagnosis not present

## 2016-04-05 DIAGNOSIS — Z794 Long term (current) use of insulin: Secondary | ICD-10-CM | POA: Diagnosis not present

## 2016-04-05 DIAGNOSIS — R42 Dizziness and giddiness: Secondary | ICD-10-CM | POA: Diagnosis not present

## 2016-04-05 DIAGNOSIS — I10 Essential (primary) hypertension: Secondary | ICD-10-CM | POA: Diagnosis not present

## 2016-04-05 DIAGNOSIS — E1159 Type 2 diabetes mellitus with other circulatory complications: Secondary | ICD-10-CM | POA: Diagnosis not present

## 2016-04-05 DIAGNOSIS — E1165 Type 2 diabetes mellitus with hyperglycemia: Secondary | ICD-10-CM | POA: Diagnosis not present

## 2016-04-08 DIAGNOSIS — Z471 Aftercare following joint replacement surgery: Secondary | ICD-10-CM | POA: Diagnosis not present

## 2016-04-08 DIAGNOSIS — Z96641 Presence of right artificial hip joint: Secondary | ICD-10-CM | POA: Diagnosis not present

## 2016-04-14 DIAGNOSIS — I2510 Atherosclerotic heart disease of native coronary artery without angina pectoris: Secondary | ICD-10-CM | POA: Diagnosis not present

## 2016-04-14 DIAGNOSIS — B9789 Other viral agents as the cause of diseases classified elsewhere: Secondary | ICD-10-CM | POA: Diagnosis not present

## 2016-04-14 DIAGNOSIS — R42 Dizziness and giddiness: Secondary | ICD-10-CM | POA: Diagnosis not present

## 2016-04-14 DIAGNOSIS — I10 Essential (primary) hypertension: Secondary | ICD-10-CM | POA: Diagnosis not present

## 2016-04-14 DIAGNOSIS — I482 Chronic atrial fibrillation: Secondary | ICD-10-CM | POA: Diagnosis not present

## 2016-04-14 DIAGNOSIS — E1165 Type 2 diabetes mellitus with hyperglycemia: Secondary | ICD-10-CM | POA: Diagnosis not present

## 2016-04-14 DIAGNOSIS — Z794 Long term (current) use of insulin: Secondary | ICD-10-CM | POA: Diagnosis not present

## 2016-04-14 DIAGNOSIS — J069 Acute upper respiratory infection, unspecified: Secondary | ICD-10-CM | POA: Diagnosis not present

## 2016-04-14 DIAGNOSIS — E1159 Type 2 diabetes mellitus with other circulatory complications: Secondary | ICD-10-CM | POA: Diagnosis not present

## 2016-04-19 DIAGNOSIS — E119 Type 2 diabetes mellitus without complications: Secondary | ICD-10-CM | POA: Diagnosis not present

## 2016-04-19 DIAGNOSIS — H35351 Cystoid macular degeneration, right eye: Secondary | ICD-10-CM | POA: Diagnosis not present

## 2016-04-19 DIAGNOSIS — H348312 Tributary (branch) retinal vein occlusion, right eye, stable: Secondary | ICD-10-CM | POA: Diagnosis not present

## 2016-04-19 DIAGNOSIS — H35041 Retinal micro-aneurysms, unspecified, right eye: Secondary | ICD-10-CM | POA: Diagnosis not present

## 2016-04-20 ENCOUNTER — Other Ambulatory Visit: Payer: Self-pay | Admitting: Pharmacist Clinician (PhC)/ Clinical Pharmacy Specialist

## 2016-04-20 MED ORDER — WARFARIN SODIUM 5 MG PO TABS: ORAL_TABLET | ORAL | 1 refills | Status: DC

## 2016-04-26 ENCOUNTER — Ambulatory Visit (INDEPENDENT_AMBULATORY_CARE_PROVIDER_SITE_OTHER): Payer: Medicare Other | Admitting: Pharmacist

## 2016-04-26 DIAGNOSIS — I10 Essential (primary) hypertension: Secondary | ICD-10-CM

## 2016-04-26 DIAGNOSIS — I4891 Unspecified atrial fibrillation: Secondary | ICD-10-CM | POA: Diagnosis not present

## 2016-04-26 DIAGNOSIS — Z7901 Long term (current) use of anticoagulants: Secondary | ICD-10-CM | POA: Diagnosis not present

## 2016-04-26 DIAGNOSIS — Z5181 Encounter for therapeutic drug level monitoring: Secondary | ICD-10-CM

## 2016-04-26 DIAGNOSIS — I2510 Atherosclerotic heart disease of native coronary artery without angina pectoris: Secondary | ICD-10-CM | POA: Diagnosis not present

## 2016-04-26 LAB — POCT INR: INR: 2.1

## 2016-04-27 DIAGNOSIS — I10 Essential (primary) hypertension: Secondary | ICD-10-CM | POA: Diagnosis not present

## 2016-04-27 DIAGNOSIS — E1159 Type 2 diabetes mellitus with other circulatory complications: Secondary | ICD-10-CM | POA: Diagnosis not present

## 2016-04-27 DIAGNOSIS — I2510 Atherosclerotic heart disease of native coronary artery without angina pectoris: Secondary | ICD-10-CM | POA: Diagnosis not present

## 2016-04-27 DIAGNOSIS — E1165 Type 2 diabetes mellitus with hyperglycemia: Secondary | ICD-10-CM | POA: Diagnosis not present

## 2016-04-27 DIAGNOSIS — I482 Chronic atrial fibrillation: Secondary | ICD-10-CM | POA: Diagnosis not present

## 2016-04-27 DIAGNOSIS — Z794 Long term (current) use of insulin: Secondary | ICD-10-CM | POA: Diagnosis not present

## 2016-05-12 ENCOUNTER — Ambulatory Visit (INDEPENDENT_AMBULATORY_CARE_PROVIDER_SITE_OTHER): Payer: Medicare Other | Admitting: Podiatry

## 2016-05-12 ENCOUNTER — Encounter: Payer: Self-pay | Admitting: Podiatry

## 2016-05-12 DIAGNOSIS — B351 Tinea unguium: Secondary | ICD-10-CM

## 2016-05-12 DIAGNOSIS — M79675 Pain in left toe(s): Secondary | ICD-10-CM | POA: Diagnosis not present

## 2016-05-12 DIAGNOSIS — E119 Type 2 diabetes mellitus without complications: Secondary | ICD-10-CM

## 2016-05-12 NOTE — Progress Notes (Signed)
Patient ID: [name], male   DOB: 1929-02-19, 83 y.o.   MRN: [id number] Complaint:  Visit Type: Patient returns to my office for continued preventative foot care services. Complaint: Patient states" my nails have grown long and thick and become painful to walk and wear shoes" Patient has been diagnosed with DM with no foot  complications. He presents for preventative foot care services. No changes to ROS  Podiatric Exam: Vascular: dorsalis pedis and posterior tibial pulses are palpable bilateral. Capillary return is immediate. Temperature gradient is WNL. Skin turgor WNL  Sensorium: Normal Semmes Weinstein monofilament test. Normal tactile sensation bilaterally. Nail Exam: Pt has thick disfigured discolored nails with subungual debris noted bilateral entire nail hallux through fifth toenails Ulcer Exam: There is no evidence of ulcer or pre-ulcerative changes or infection. Orthopedic Exam: Muscle tone and strength are WNL. No limitations in general ROM. No crepitus or effusions noted. Foot type and digits show no abnormalities. Bony prominences are unremarkable. Skin: No Porokeratosis. No infection or ulcers  Diagnosis:  Tinea unguium, Pain in right toe, pain in left toes  Treatment & Plan Procedures and Treatment: Consent by patient was obtained for treatment procedures. The patient understood the discussion of treatment and procedures well. All questions were answered thoroughly reviewed. Debridement of mycotic and hypertrophic toenails, 1 through 5 bilateral and clearing of subungual debris. No ulceration, no infection noted.  Return Visit-Office Procedure: Patient instructed to return to the office for a follow up visit 3 months for continued evaluation and treatment.    Gardiner Barefoot DPM

## 2016-05-13 DIAGNOSIS — Z96641 Presence of right artificial hip joint: Secondary | ICD-10-CM | POA: Diagnosis not present

## 2016-05-13 DIAGNOSIS — Z471 Aftercare following joint replacement surgery: Secondary | ICD-10-CM | POA: Diagnosis not present

## 2016-05-13 DIAGNOSIS — M7061 Trochanteric bursitis, right hip: Secondary | ICD-10-CM | POA: Diagnosis not present

## 2016-05-28 DIAGNOSIS — H01003 Unspecified blepharitis right eye, unspecified eyelid: Secondary | ICD-10-CM | POA: Diagnosis not present

## 2016-05-28 DIAGNOSIS — H524 Presbyopia: Secondary | ICD-10-CM | POA: Diagnosis not present

## 2016-05-28 DIAGNOSIS — H40013 Open angle with borderline findings, low risk, bilateral: Secondary | ICD-10-CM | POA: Diagnosis not present

## 2016-06-01 DIAGNOSIS — E119 Type 2 diabetes mellitus without complications: Secondary | ICD-10-CM | POA: Diagnosis not present

## 2016-06-01 DIAGNOSIS — Z794 Long term (current) use of insulin: Secondary | ICD-10-CM | POA: Diagnosis not present

## 2016-06-01 DIAGNOSIS — R42 Dizziness and giddiness: Secondary | ICD-10-CM | POA: Diagnosis not present

## 2016-06-01 DIAGNOSIS — R270 Ataxia, unspecified: Secondary | ICD-10-CM | POA: Diagnosis not present

## 2016-06-01 DIAGNOSIS — I2510 Atherosclerotic heart disease of native coronary artery without angina pectoris: Secondary | ICD-10-CM | POA: Diagnosis not present

## 2016-06-01 DIAGNOSIS — I10 Essential (primary) hypertension: Secondary | ICD-10-CM | POA: Diagnosis not present

## 2016-06-01 DIAGNOSIS — I482 Chronic atrial fibrillation: Secondary | ICD-10-CM | POA: Diagnosis not present

## 2016-06-01 DIAGNOSIS — R2689 Other abnormalities of gait and mobility: Secondary | ICD-10-CM | POA: Diagnosis not present

## 2016-06-02 ENCOUNTER — Encounter (HOSPITAL_COMMUNITY): Payer: Self-pay | Admitting: Emergency Medicine

## 2016-06-02 ENCOUNTER — Telehealth: Payer: Self-pay | Admitting: Vascular Surgery

## 2016-06-02 ENCOUNTER — Emergency Department (HOSPITAL_COMMUNITY): Payer: Medicare Other

## 2016-06-02 ENCOUNTER — Observation Stay (HOSPITAL_BASED_OUTPATIENT_CLINIC_OR_DEPARTMENT_OTHER)
Admission: EM | Admit: 2016-06-02 | Discharge: 2016-06-03 | Disposition: A | Discharge status: Home / Self Care | Payer: Medicare Other | Source: Home / Self Care | Attending: Family Medicine | Admitting: Family Medicine

## 2016-06-02 DIAGNOSIS — R404 Transient alteration of awareness: Secondary | ICD-10-CM | POA: Diagnosis not present

## 2016-06-02 DIAGNOSIS — E1136 Type 2 diabetes mellitus with diabetic cataract: Secondary | ICD-10-CM | POA: Diagnosis not present

## 2016-06-02 DIAGNOSIS — I482 Chronic atrial fibrillation, unspecified: Secondary | ICD-10-CM | POA: Diagnosis present

## 2016-06-02 DIAGNOSIS — Z794 Long term (current) use of insulin: Secondary | ICD-10-CM

## 2016-06-02 DIAGNOSIS — I10 Essential (primary) hypertension: Secondary | ICD-10-CM | POA: Diagnosis present

## 2016-06-02 DIAGNOSIS — D72829 Elevated white blood cell count, unspecified: Secondary | ICD-10-CM | POA: Insufficient documentation

## 2016-06-02 DIAGNOSIS — I951 Orthostatic hypotension: Secondary | ICD-10-CM | POA: Insufficient documentation

## 2016-06-02 DIAGNOSIS — E860 Dehydration: Secondary | ICD-10-CM | POA: Insufficient documentation

## 2016-06-02 DIAGNOSIS — I6521 Occlusion and stenosis of right carotid artery: Secondary | ICD-10-CM

## 2016-06-02 DIAGNOSIS — Z7901 Long term (current) use of anticoagulants: Secondary | ICD-10-CM

## 2016-06-02 DIAGNOSIS — S199XXA Unspecified injury of neck, initial encounter: Secondary | ICD-10-CM | POA: Diagnosis not present

## 2016-06-02 DIAGNOSIS — J189 Pneumonia, unspecified organism: Secondary | ICD-10-CM | POA: Diagnosis present

## 2016-06-02 DIAGNOSIS — K219 Gastro-esophageal reflux disease without esophagitis: Secondary | ICD-10-CM | POA: Insufficient documentation

## 2016-06-02 DIAGNOSIS — Z87891 Personal history of nicotine dependence: Secondary | ICD-10-CM

## 2016-06-02 DIAGNOSIS — I6522 Occlusion and stenosis of left carotid artery: Secondary | ICD-10-CM | POA: Diagnosis not present

## 2016-06-02 DIAGNOSIS — Z9861 Coronary angioplasty status: Secondary | ICD-10-CM

## 2016-06-02 DIAGNOSIS — Z951 Presence of aortocoronary bypass graft: Secondary | ICD-10-CM

## 2016-06-02 DIAGNOSIS — M542 Cervicalgia: Secondary | ICD-10-CM | POA: Diagnosis not present

## 2016-06-02 DIAGNOSIS — J181 Lobar pneumonia, unspecified organism: Secondary | ICD-10-CM

## 2016-06-02 DIAGNOSIS — E119 Type 2 diabetes mellitus without complications: Secondary | ICD-10-CM

## 2016-06-02 DIAGNOSIS — Z955 Presence of coronary angioplasty implant and graft: Secondary | ICD-10-CM | POA: Insufficient documentation

## 2016-06-02 DIAGNOSIS — M436 Torticollis: Secondary | ICD-10-CM

## 2016-06-02 DIAGNOSIS — R05 Cough: Secondary | ICD-10-CM | POA: Diagnosis not present

## 2016-06-02 DIAGNOSIS — R0602 Shortness of breath: Secondary | ICD-10-CM | POA: Diagnosis not present

## 2016-06-02 DIAGNOSIS — E785 Hyperlipidemia, unspecified: Secondary | ICD-10-CM

## 2016-06-02 DIAGNOSIS — I152 Hypertension secondary to endocrine disorders: Secondary | ICD-10-CM | POA: Diagnosis present

## 2016-06-02 DIAGNOSIS — I2510 Atherosclerotic heart disease of native coronary artery without angina pectoris: Secondary | ICD-10-CM

## 2016-06-02 DIAGNOSIS — N401 Enlarged prostate with lower urinary tract symptoms: Secondary | ICD-10-CM

## 2016-06-02 DIAGNOSIS — N138 Other obstructive and reflux uropathy: Secondary | ICD-10-CM

## 2016-06-02 DIAGNOSIS — I639 Cerebral infarction, unspecified: Secondary | ICD-10-CM | POA: Diagnosis not present

## 2016-06-02 DIAGNOSIS — R270 Ataxia, unspecified: Secondary | ICD-10-CM

## 2016-06-02 DIAGNOSIS — Z79899 Other long term (current) drug therapy: Secondary | ICD-10-CM

## 2016-06-02 DIAGNOSIS — Z96641 Presence of right artificial hip joint: Secondary | ICD-10-CM | POA: Insufficient documentation

## 2016-06-02 DIAGNOSIS — H8110 Benign paroxysmal vertigo, unspecified ear: Secondary | ICD-10-CM

## 2016-06-02 DIAGNOSIS — I6529 Occlusion and stenosis of unspecified carotid artery: Secondary | ICD-10-CM | POA: Diagnosis present

## 2016-06-02 DIAGNOSIS — R42 Dizziness and giddiness: Secondary | ICD-10-CM | POA: Diagnosis not present

## 2016-06-02 DIAGNOSIS — E1121 Type 2 diabetes mellitus with diabetic nephropathy: Secondary | ICD-10-CM

## 2016-06-02 HISTORY — DX: Ataxia, unspecified: R27.0

## 2016-06-02 LAB — URINALYSIS, ROUTINE W REFLEX MICROSCOPIC
Bilirubin Urine: NEGATIVE
Glucose, UA: >=500 mg/dL — AB
Ketones, ur: 5 mg/dL — AB
Leukocytes, UA: NEGATIVE
Nitrite: NEGATIVE
Protein, ur: NEGATIVE mg/dL
Specific Gravity, Urine: 1.026 (ref 1.005–1.030)
WBC, UA: NONE SEEN WBC/hpf (ref 0–5)
pH: 5 (ref 5.0–8.0)

## 2016-06-02 LAB — BASIC METABOLIC PANEL
Anion gap: 10 (ref 5–15)
BUN: 17 mg/dL (ref 6–20)
CO2: 25 mmol/L (ref 22–32)
Calcium: 9.3 mg/dL (ref 8.9–10.3)
Chloride: 103 mmol/L (ref 101–111)
Creatinine, Ser: 1.1 mg/dL (ref 0.61–1.24)
GFR calc Af Amer: >60 mL/min (ref >60)
GFR calc non Af Amer: 59 mL/min — ABNORMAL LOW (ref >60)
Glucose, Bld: 165 mg/dL — ABNORMAL HIGH (ref 65–99)
Potassium: 4 mmol/L (ref 3.5–5.1)
Sodium: 138 mmol/L (ref 135–145)

## 2016-06-02 LAB — CBC WITH DIFFERENTIAL/PLATELET
Basophils Absolute: 0 10*3/uL (ref 0.0–0.1)
Basophils Relative: 0 %
Eosinophils Absolute: 0.1 10*3/uL (ref 0.0–0.7)
Eosinophils Relative: 0 %
HCT: 39.7 % (ref 39.0–52.0)
Hemoglobin: 12.9 g/dL — ABNORMAL LOW (ref 13.0–17.0)
Lymphocytes Relative: 8 %
Lymphs Abs: 1.3 10*3/uL (ref 0.7–4.0)
MCH: 27.1 pg (ref 26.0–34.0)
MCHC: 32.5 g/dL (ref 30.0–36.0)
MCV: 83.4 fL (ref 78.0–100.0)
Monocytes Absolute: 1.4 10*3/uL — ABNORMAL HIGH (ref 0.1–1.0)
Monocytes Relative: 8 %
Neutro Abs: 13.9 10*3/uL — ABNORMAL HIGH (ref 1.7–7.7)
Neutrophils Relative %: 84 %
Platelets: 166 10*3/uL (ref 150–400)
RBC: 4.76 MIL/uL (ref 4.22–5.81)
RDW: 14.4 % (ref 11.5–15.5)
WBC: 16.6 10*3/uL — ABNORMAL HIGH (ref 4.0–10.5)

## 2016-06-02 LAB — I-STAT TROPONIN, ED: Troponin i, poc: 0 ng/mL (ref 0.00–0.08)

## 2016-06-02 LAB — INFLUENZA PANEL BY PCR (TYPE A & B)
Influenza A By PCR: NEGATIVE
Influenza B By PCR: NEGATIVE

## 2016-06-02 LAB — STREP PNEUMONIAE URINARY ANTIGEN: Strep Pneumo Urinary Antigen: NEGATIVE

## 2016-06-02 LAB — GLUCOSE, CAPILLARY
Glucose-Capillary: 185 mg/dL — ABNORMAL HIGH (ref 65–99)
Glucose-Capillary: 298 mg/dL — ABNORMAL HIGH (ref 65–99)

## 2016-06-02 LAB — PROTIME-INR
INR: 1.57
INR: 1.55
Prothrombin Time: 19 seconds — ABNORMAL HIGH (ref 11.4–15.2)
Prothrombin Time: 18.7 seconds — ABNORMAL HIGH (ref 11.4–15.2)

## 2016-06-02 MED ORDER — WARFARIN - PHARMACIST DOSING INPATIENT
Freq: Every day | Status: DC
  Administered 2016-06-02: 18:00:00

## 2016-06-02 MED ORDER — ACETAMINOPHEN 325 MG PO TABS
650.0000 mg | ORAL_TABLET | Freq: Once | ORAL | Status: AC
  Administered 2016-06-02: 650 mg via ORAL
  Filled 2016-06-02: qty 2

## 2016-06-02 MED ORDER — IOPAMIDOL (ISOVUE-370) INJECTION 76%
INTRAVENOUS | Status: AC
  Administered 2016-06-02: 50 mL
  Filled 2016-06-02: qty 50

## 2016-06-02 MED ORDER — ONDANSETRON HCL 4 MG/2ML IJ SOLN: 4.0000 mg | Freq: Four times a day (QID) | INTRAMUSCULAR | Status: DC | PRN

## 2016-06-02 MED ORDER — ONDANSETRON HCL 4 MG PO TABS: 4.0000 mg | ORAL_TABLET | Freq: Four times a day (QID) | ORAL | Status: DC | PRN

## 2016-06-02 MED ORDER — NITROGLYCERIN 0.4 MG SL SUBL: 0.4000 mg | SUBLINGUAL_TABLET | SUBLINGUAL | Status: DC | PRN

## 2016-06-02 MED ORDER — SODIUM CHLORIDE 0.9% FLUSH
3.0000 mL | Freq: Two times a day (BID) | INTRAVENOUS | Status: DC
  Administered 2016-06-02 – 2016-06-03 (×2): 3 mL via INTRAVENOUS

## 2016-06-02 MED ORDER — INSULIN ASPART 100 UNIT/ML ~~LOC~~ SOLN
0.0000 [IU] | Freq: Three times a day (TID) | SUBCUTANEOUS | Status: DC
  Administered 2016-06-02: 3 [IU] via SUBCUTANEOUS
  Administered 2016-06-03: 5 [IU] via SUBCUTANEOUS
  Administered 2016-06-03: 3 [IU] via SUBCUTANEOUS

## 2016-06-02 MED ORDER — DILTIAZEM HCL ER COATED BEADS 180 MG PO CP24
360.0000 mg | ORAL_CAPSULE | Freq: Every day | ORAL | Status: DC
  Administered 2016-06-02 – 2016-06-03 (×2): 360 mg via ORAL
  Filled 2016-06-02 (×2): qty 2
  Filled 2016-06-02: qty 1

## 2016-06-02 MED ORDER — ATORVASTATIN CALCIUM 40 MG PO TABS
40.0000 mg | ORAL_TABLET | Freq: Every day | ORAL | Status: DC
  Administered 2016-06-02: 40 mg via ORAL
  Filled 2016-06-02 (×2): qty 1

## 2016-06-02 MED ORDER — METHYLPREDNISOLONE SODIUM SUCC 125 MG IJ SOLR
60.0000 mg | Freq: Once | INTRAMUSCULAR | Status: AC
  Administered 2016-06-02: 60 mg via INTRAVENOUS
  Filled 2016-06-02: qty 2

## 2016-06-02 MED ORDER — MECLIZINE HCL 25 MG PO TABS
25.0000 mg | ORAL_TABLET | Freq: Once | ORAL | Status: AC
  Administered 2016-06-02: 25 mg via ORAL
  Filled 2016-06-02: qty 1

## 2016-06-02 MED ORDER — INSULIN ASPART 100 UNIT/ML ~~LOC~~ SOLN
0.0000 [IU] | Freq: Every day | SUBCUTANEOUS | Status: DC
  Administered 2016-06-02: 3 [IU] via SUBCUTANEOUS

## 2016-06-02 MED ORDER — DOXYCYCLINE HYCLATE 100 MG PO TABS
100.0000 mg | ORAL_TABLET | Freq: Once | ORAL | Status: AC
  Administered 2016-06-02: 100 mg via ORAL
  Filled 2016-06-02: qty 1

## 2016-06-02 MED ORDER — DUTASTERIDE 0.5 MG PO CAPS
0.5000 mg | ORAL_CAPSULE | ORAL | Status: DC
  Administered 2016-06-02: 0.5 mg via ORAL
  Filled 2016-06-02 (×2): qty 1

## 2016-06-02 MED ORDER — METHOCARBAMOL 500 MG PO TABS
500.0000 mg | ORAL_TABLET | Freq: Three times a day (TID) | ORAL | Status: DC
  Administered 2016-06-02 – 2016-06-03 (×4): 500 mg via ORAL
  Filled 2016-06-02 (×4): qty 1

## 2016-06-02 MED ORDER — INSULIN GLARGINE 100 UNIT/ML ~~LOC~~ SOLN
30.0000 [IU] | Freq: Every day | SUBCUTANEOUS | Status: DC
  Administered 2016-06-02: 30 [IU] via SUBCUTANEOUS
  Filled 2016-06-02 (×2): qty 0.3

## 2016-06-02 MED ORDER — KETOROLAC TROMETHAMINE 15 MG/ML IJ SOLN: 15.0000 mg | Freq: Four times a day (QID) | INTRAMUSCULAR | Status: DC | PRN

## 2016-06-02 MED ORDER — SODIUM CHLORIDE 0.9 % IV SOLN
INTRAVENOUS | Status: DC
  Administered 2016-06-02 (×2): via INTRAVENOUS

## 2016-06-02 MED ORDER — WARFARIN SODIUM 5 MG PO TABS
10.0000 mg | ORAL_TABLET | Freq: Once | ORAL | Status: AC
  Administered 2016-06-02: 10 mg via ORAL
  Filled 2016-06-02: qty 1
  Filled 2016-06-02: qty 2

## 2016-06-02 MED ORDER — ACETAMINOPHEN 325 MG PO TABS: 650.0000 mg | ORAL_TABLET | Freq: Four times a day (QID) | ORAL | Status: DC | PRN

## 2016-06-02 MED ORDER — METOPROLOL SUCCINATE ER 100 MG PO TB24
100.0000 mg | ORAL_TABLET | Freq: Every day | ORAL | Status: DC
  Administered 2016-06-03: 100 mg via ORAL
  Filled 2016-06-02: qty 1

## 2016-06-02 MED ORDER — SENNOSIDES-DOCUSATE SODIUM 8.6-50 MG PO TABS
1.0000 | ORAL_TABLET | Freq: Every evening | ORAL | Status: DC | PRN
  Filled 2016-06-02: qty 1

## 2016-06-02 MED ORDER — ACETAMINOPHEN 650 MG RE SUPP: 650.0000 mg | Freq: Four times a day (QID) | RECTAL | Status: DC | PRN

## 2016-06-02 MED ORDER — ENOXAPARIN SODIUM 100 MG/ML ~~LOC~~ SOLN
100.0000 mg | Freq: Two times a day (BID) | SUBCUTANEOUS | Status: DC
  Administered 2016-06-02 – 2016-06-03 (×3): 100 mg via SUBCUTANEOUS
  Filled 2016-06-02 (×3): qty 1

## 2016-06-02 MED ORDER — SODIUM CHLORIDE 0.9 % IV BOLUS (SEPSIS)
1000.0000 mL | Freq: Once | INTRAVENOUS | Status: AC
  Administered 2016-06-02: 1000 mL via INTRAVENOUS

## 2016-06-02 MED ORDER — DOXYCYCLINE HYCLATE 100 MG PO TABS
100.0000 mg | ORAL_TABLET | Freq: Two times a day (BID) | ORAL | Status: DC
  Administered 2016-06-02 – 2016-06-03 (×2): 100 mg via ORAL
  Filled 2016-06-02 (×3): qty 1

## 2016-06-02 MED ORDER — MECLIZINE HCL 25 MG PO TABS: 25.0000 mg | ORAL_TABLET | Freq: Three times a day (TID) | ORAL | Status: DC | PRN

## 2016-06-02 MED ORDER — TAMSULOSIN HCL 0.4 MG PO CAPS
0.4000 mg | ORAL_CAPSULE | ORAL | Status: DC
  Administered 2016-06-02: 0.4 mg via ORAL
  Filled 2016-06-02 (×2): qty 1

## 2016-06-02 MED ORDER — PANTOPRAZOLE SODIUM 40 MG PO TBEC
40.0000 mg | DELAYED_RELEASE_TABLET | Freq: Every day | ORAL | Status: DC
  Administered 2016-06-02 – 2016-06-03 (×2): 40 mg via ORAL
  Filled 2016-06-02 (×2): qty 1

## 2016-06-02 NOTE — ED Notes (Signed)
Patient states he is very uncomfortable lying on the stretcher , placed patient in a recliner. States that felt much better. Breakfast tray ordered. Wife remains at bedside.

## 2016-06-02 NOTE — Progress Notes (Signed)
This is a no charge note  Pending admission per dr. Dina Rich  83 year old male with past medical history of hypertension, hyperlipidemia, diabetes mellitus, GERD, CAD, stent placement, BPH, atrial fibrillation on Coumadin, vertigo, who presents with dizziness, lightheadedness and neck pain for 3 days. Patient states that he had negative MRI in [facility] (need to be confirmed) per EDP. CTA of head and neck obtained to evaluate for critical stenosis of the vertebral arteries,which are negative. On CT scan there is question of a patchy right upper lobe opacity concerning for pneumonia. Pt does not have symptoms of pneumonia, but has leukocytosis and temperature 99. Doxycyline was given by EDP. Pt is positive for orthostatic vital signs. 1L NS was given, will continue NS at 100 cc/h. INR 1.57, negative troponin, negative urinalysis, electrolytes renal function okay. Pt is accepted to tele bed for obs.   Ivor Costa, MD  [facility] Pager [phone]  If 7PM-7AM, please contact night-coverage [email or web address] Password [email or web address] 06/02/2016, 6:09 AM

## 2016-06-02 NOTE — Telephone Encounter (Signed)
Scheduled 12/02/16 °

## 2016-06-02 NOTE — Consult Note (Signed)
Referring Physician: Dr Arvid Right ER  Patient name: [name] MRN: [id number] DOB: 1928-05-02 Sex: male  REASON FOR CONSULT: dizziness with carotid stenosis  HPI: [name] is a 83 y.o. male with several day history of dizziness.  Recent MRI showed no evidence of stroke.  Seen by his primary care physician and referred to Neurology.  He has a history of vertigo but states these symptoms are different.  His vertigo usually gives him a spinning room sensation.  He states this feels more like a "cloud over his head".  He has a history of afib but has not really noted any palpitations recently.  His symptoms are worse if he is lying flat in bed or when he goes to sit or stand up.  He reports no sick contacts.  He denies anorexia or decreased intake of fluids.  He has no prior history of stroke.  He denies TIA or amaurosis.  He is on warfarin for his afib. Other medical problems include diabetes, CAD, hypertension, hyperlipidemia, aortic stenosis.   These have all been stable.  Past Medical History:  Diagnosis Date  . Abdominal pain   . Acute renal failure ([place])     resolved  . Arthritis    "hands & legs" (11/'10/2014)  . Ataxia   . Atrial fibrillation ([place])   . Bladder outlet obstruction   . Bladder outlet obstruction   . BPH (benign prostatic hyperplasia)   . CAD (coronary artery disease)    a. CABG IN 1989. b. 01/08/2015 CTO of ost LAD, LIMA to LAD not visualized but assumed patent given myoview finding, occluded SVG to diagonal, 99% mid RCA tx w/ SYNERGY DES 3X28 mm  . Constipation   . Diabetes mellitus type 2, insulin dependent ([place])   . Epistaxis, recurrent Feb 2017  . GERD (gastroesophageal reflux disease)   . HTN (hypertension)   . Hyperlipemia   . Kidney stones   . Mild aortic stenosis   . Rib fractures    left rib fractures being treated with pain medications  . Stented coronary artery Nov 2016   RCA DES  . Urinary tract infection     Enterococcus   Past  Surgical History:  Procedure Laterality Date  . CARDIAC CATHETERIZATION  1989  . CARDIAC CATHETERIZATION N/A 01/08/2015   Procedure: Left Heart Cath and Cors/Grafts Angiography;  Surgeon: Peter M Martinique, MD;  Location: [facility];  Service: Cardiovascular;  Laterality: N/A;  . CARDIAC CATHETERIZATION  01/08/2015   Procedure: Coronary Stent Intervention;  Surgeon: Peter M Martinique, MD;  Location: [facility] [facility];  Service: Cardiovascular;;  . CARPAL TUNNEL RELEASE Right 08/2010  . CATARACT EXTRACTION W/ INTRAOCULAR LENS  IMPLANT, BILATERAL Bilateral   . CORONARY ANGIOPLASTY  01/08/15   RCA DES  . CORONARY ARTERY BYPASS GRAFT  1989   "CABG X 2"  . JOINT REPLACEMENT    . TONSILLECTOMY    . TOTAL HIP ARTHROPLASTY Right 2000    Family History  Problem Relation Age of Onset  . Brain cancer Father   . Throat cancer Brother     SOCIAL HISTORY: Social History   Social History  . Marital status: Married    Spouse name: N/A  . Number of children: 3  . Years of education: N/A   Occupational History  . Agricultural consultant    Social History Main Topics  . Smoking status: Former Smoker    Packs/day: 3.00    Years: 10.00  Types: Cigarettes    Quit date: 05/11/1958  . Smokeless tobacco: Never Used  . Alcohol use No  . Drug use: No  . Sexual activity: Not Currently   Other Topics Concern  . Not on file   Social History Narrative  . No narrative on file    Allergies  Allergen Reactions  . Erythromycin Base   . Amoxicillin Other (See Comments)    Unknown  . Cefadroxil Other (See Comments)    Unknown  . Ciprofloxacin Other (See Comments)    Unknown  . Erythromycin Other (See Comments)    Upset stomach    Current Facility-Administered Medications  Medication Dose Route Frequency Provider Last Rate Last Dose  . 0.9 %  sodium chloride infusion   Intravenous Continuous Ivor Costa, MD 100 mL/hr at 06/02/16 [phone]    . acetaminophen (TYLENOL) tablet 650 mg  650 mg Oral  Q6H PRN Radene Gunning, NP       Or  . acetaminophen (TYLENOL) suppository 650 mg  650 mg Rectal Q6H PRN Radene Gunning, NP      . atorvastatin (LIPITOR) tablet 40 mg  40 mg Oral q1800 Lezlie Octave Black, NP      . diltiazem (CARDIZEM CD) 24 hr capsule 360 mg  360 mg Oral Daily Lezlie Octave Black, NP      . doxycycline (VIBRA-TABS) tablet 100 mg  100 mg Oral Q12H Lezlie Octave Black, NP      . dutasteride (AVODART) capsule 0.5 mg  0.5 mg Oral QODAY Lezlie Octave Black, NP      . enoxaparin (LOVENOX) injection 100 mg  100 mg Subcutaneous Q12H Waldemar Dickens, MD   100 mg at 06/02/16 1011  . insulin aspart (novoLOG) injection 0-15 Units  0-15 Units Subcutaneous TID WC Lezlie Octave Black, NP      . insulin aspart (novoLOG) injection 0-5 Units  0-5 Units Subcutaneous QHS Lezlie Octave Black, NP      . insulin glargine (LANTUS) injection 30 Units  30 Units Subcutaneous QHS Lezlie Octave Black, NP      . ketorolac (TORADOL) 15 MG/ML injection 15 mg  15 mg Intravenous Q6H PRN Radene Gunning, NP      . meclizine (ANTIVERT) tablet 25 mg  25 mg Oral TID PRN Ivor Costa, MD      . methocarbamol (ROBAXIN) tablet 500 mg  500 mg Oral TID Radene Gunning, NP      . methylPREDNISolone sodium succinate (SOLU-MEDROL) 125 mg/2 mL injection 60 mg  60 mg Intravenous Once Waldemar Dickens, MD      . Derrill Memo ON 06/03/2016] metoprolol succinate (TOPROL-XL) 24 hr tablet 100 mg  100 mg Oral Daily Lezlie Octave Black, NP      . nitroGLYCERIN (NITROSTAT) SL tablet 0.4 mg  0.4 mg Sublingual Q5 min PRN Radene Gunning, NP      . ondansetron [facility]) tablet 4 mg  4 mg Oral Q6H PRN Radene Gunning, NP       Or  . ondansetron [facility]) injection 4 mg  4 mg Intravenous Q6H PRN Radene Gunning, NP      . pantoprazole (PROTONIX) EC tablet 40 mg  40 mg Oral Daily Lezlie Octave Black, NP   40 mg at 06/02/16 1010  . senna-docusate (Senokot-S) tablet 1 tablet  1 tablet Oral QHS PRN Lezlie Octave Black, NP      . sodium chloride flush (NS) 0.9 % injection 3 mL  3 mL Intravenous  Q12H Lezlie Octave Black, NP      .  tamsulosin [facility]) capsule 0.4 mg  0.4 mg Oral QODAY Lezlie Octave Black, NP   0.4 mg at 06/02/16 1010  . warfarin (COUMADIN) tablet 10 mg  10 mg Oral ONCE-1800 Waldemar Dickens, MD      . Warfarin - Pharmacist Dosing Inpatient   Does not apply [id number] Waldemar Dickens, MD       Current Outpatient Prescriptions  Medication Sig Dispense Refill  . AVODART 0.5 MG capsule Take 0.5 mg by mouth every other day.     . diltiazem (CARDIZEM CD) 360 MG 24 hr capsule Take 1 capsule (360 mg total) by mouth daily. 30 capsule 6  . empagliflozin (JARDIANCE) 10 MG TABS tablet Take 10 mg by mouth daily.    . insulin glargine (LANTUS) 100 UNIT/ML injection Inject 34 Units into the skin daily.     [name] lisinopril (PRINIVIL,ZESTRIL) 5 MG tablet Take 1 tablet (5 mg total) by mouth daily. 90 tablet 3  . metFORMIN (GLUCOPHAGE) 1000 MG tablet Take 1 tablet by mouth 2 (two) times daily.    . metoprolol succinate (TOPROL-XL) 100 MG 24 hr tablet Take 1 tablet (100 mg total) by mouth 2 (two) times daily. 180 tablet 3  . nitroGLYCERIN (NITROSTAT) 0.4 MG SL tablet Place 0.4 mg under the tongue every 5 (five) minutes as needed for chest pain (x 3 doses).    . pantoprazole (PROTONIX) 40 MG tablet Take 1 tablet by mouth daily at Noon 90 tablet 3  . simvastatin (ZOCOR) 80 MG tablet Take 40 mg by mouth at bedtime.     . Tamsulosin HCl (FLOMAX) 0.4 MG CAPS Take 0.4 mg by mouth every other day.     . warfarin (COUMADIN) 5 MG tablet Take 1 to 1.5 tablets by mouth daily as directed by coumadin clinic (Patient taking differently: Take 5-7.5 mg by mouth daily. Take 5mg  daily except Sunday and Wednesday take 7.5mg ) 120 tablet 1    ROS:   General:  No weight loss, Fever, chills  HEENT: No recent headaches, no nasal bleeding, no visual changes, no sore throat  Neurologic: No dizziness, blackouts, seizures. No recent symptoms of stroke or mini- stroke. No recent episodes of slurred speech, or temporary blindness.  Cardiac: No recent episodes of  chest pain/pressure, no shortness of breath at rest.  + shortness of breath with exertion.  Denies history of atrial fibrillation or irregular heartbeat  Vascular: No history of rest pain in feet.  No history of claudication.  No history of non-healing ulcer, No history of DVT   Pulmonary: No home oxygen, no productive cough, no hemoptysis,  No asthma or wheezing  Musculoskeletal:  [X]  Arthritis, [ ]  Low back pain,  [X]  Joint pain  Hematologic:No history of hypercoagulable state.  No history of easy bleeding.  No history of anemia  Gastrointestinal: No hematochezia or melena,  No gastroesophageal reflux, no trouble swallowing  Urinary: [ ]  chronic Kidney disease, [ ]  on HD - [ ]  MWF or [ ]  TTHS, [ ]  Burning with urination, [ ]  Frequent urination, [ ]  Difficulty urinating;   Skin: No rashes  Psychological: No history of anxiety,  No history of depression   Physical Examination  Vitals:   06/02/16 0600 06/02/16 0615 06/02/16 0743 06/02/16 1008  BP: 134/75 121/61 136/78 136/78  Pulse: 76 71 79 85  Resp: 19 19 20 20   Temp:      TempSrc:  SpO2:   96% 97%  Weight:      Height:        Body mass index is 35.24 kg/m.  General:  Alert and oriented, no acute distress HEENT: Normal Neck: No bruit or JVD, unable to extend neck much Pulmonary: Clear to auscultation bilaterally Cardiac: Regular Rate and Rhythm Abdomen: Soft, non-tender, non-distended, no mass, obese Skin: No rash Extremity Pulses:  2+ radial, brachial pulses bilaterally Musculoskeletal: No deformity or edema  Neurologic: Upper and lower extremity motor 5/5 and symmetric, mild past pointing on finger nose finger, face symmetric, no nystagmus, EOMI  DATA:  CTA neck head images reviewed, 60% right ICA stenosis but calcified so CT can overestimate this.  No real left stenosis.  Vertebrals patent  MRI brain images reviewed no acute stroke  ASSESSMENT:  Pt with dizziness.  Has left ICA stenosis 60%.  Dizzyness  rarely caused by carotid vertebral disease and this is usually in the presence of multi vessel > 80% stenosis.  Pt also found to be orthostatic   PLAN:  Needs follow up carotid duplex 6 months.   Ruta Hinds, MD [facility] Office: [phone] Pager: [phone]

## 2016-06-02 NOTE — ED Notes (Signed)
Patient still sitting in recliner states he is more comfortable. . Eating lunch

## 2016-06-02 NOTE — ED Notes (Signed)
Pt also reports to having MRI done yesterday (by PCP) and MRI was normal.

## 2016-06-02 NOTE — Evaluation (Signed)
Physical Therapy Evaluation Patient Details Name: [name] MRN: [id number] DOB: 06/17/1928 Today's Date: 06/02/2016   History of Present Illness  [name] is a 83 y.o. male with several day history of dizziness.  Recent MRI showed no evidence of stroke.  Seen by his primary care physician and referred to Neurology.  He has a history of vertigo but states these symptoms are different.  His vertigo usually gives him a spinning room sensation.  He states this feels more like a "cloud over his head".  He has a history of afib but has not really noted any palpitations recently.  His symptoms are worse if he is lying flat in bed or when he goes to sit or stand up.  He reports no sick contacts.  He denies anorexia or decreased intake of fluids.  He has no prior history of stroke.  He denies TIA or amaurosis.  He is on warfarin for his afib. Other medical problems include diabetes, CAD, hypertension, hyperlipidemia, aortic stenosis.   Pt was positive early on for orthstatic hypotension and has CAP.  PMH:  significant for attention, hyperlipidemia, diabetes, CAD status post stent placement, A. fib on Coumadin, vertigo   Clinical Impression  Pt admitted with above diagnosis. Pt currently with functional limitations due to the deficits listed below (see PT Problem List). Pt was able to ambulate with RW with min assist with chair follow by wife.  Was dizzy and in afib.  BP was 134/75 at end of treatment. O2 sats 93-95% on RA.  Hr 86-97 bpm.  Will need continued therapy.   Pt will benefit from skilled PT to increase their independence and safety with mobility to allow discharge to the venue listed below.      Follow Up Recommendations Home health PT;Supervision/Assistance - 24 hour    Equipment Recommendations  None recommended by PT    Recommendations for Other Services OT consult     Precautions / Restrictions Precautions Precautions: Fall Restrictions Weight Bearing Restrictions: No       Mobility  Bed Mobility               General bed mobility comments: NT in chair  Transfers Overall transfer level: Needs assistance Equipment used: Rolling walker (2 wheeled) Transfers: Sit to/from Stand Sit to Stand: Min assist;+2 safety/equipment         General transfer comment: Pt needed cues for hand placement and steadying once up.   Ambulation/Gait Ambulation/Gait assistance: Min assist;+2 safety/equipment Ambulation Distance (Feet): 120 Feet Assistive device: Rolling walker (2 wheeled) Gait Pattern/deviations: Step-to pattern;Decreased step length - right;Decreased step length - left;Wide base of support;Drifts right/left   Gait velocity interpretation: Below normal speed for age/gender General Gait Details: Pt was able to ambulate with RW needing cues to sequence steps and RW as well as needing cues to keep RW close to him.  Needed incr guarding for turns.  Wife followed pt with chair and he did have to sit due to dizziness.   Stairs            Wheelchair Mobility    Modified Rankin (Stroke Patients Only)       Balance Overall balance assessment: Needs assistance;History of Falls Sitting-balance support: No upper extremity supported;Feet supported Sitting balance-Leahy Scale: Fair     Standing balance support: Bilateral upper extremity supported;During functional activity Standing balance-Leahy Scale: Poor Standing balance comment: relies on RW for balance.  Pertinent Vitals/Pain Pain Assessment: Faces Faces Pain Scale: Hurts even more Pain Location: left neck Pain Descriptors / Indicators: Aching;Grimacing;Guarding Pain Intervention(s): Limited activity within patient's tolerance;Monitored during session;Repositioned;Premedicated before session    Home Living Family/patient expects to be discharged to:: Private residence Living Arrangements: Spouse/significant other Available Help at Discharge:  Family;Available 24 hours/day Type of Home: House Home Access: Stairs to enter Entrance Stairs-Rails: None Entrance Stairs-Number of Steps: 1 Home Layout: One level Home Equipment: Walker - 2 wheels;Bedside commode;Shower seat - built in      Prior Function Level of Independence: Needs assistance   Gait / Transfers Assistance Needed: used RW last few days but prior to that no device  ADL's / Homemaking Assistance Needed: Independent before pt got sick.        Hand Dominance        Extremity/Trunk Assessment   Upper Extremity Assessment Upper Extremity Assessment: Defer to OT evaluation    Lower Extremity Assessment Lower Extremity Assessment: Generalized weakness       Communication   Communication: No difficulties  Cognition Arousal/Alertness: Awake/alert Behavior During Therapy: WFL for tasks assessed/performed Overall Cognitive Status: Within Functional Limits for tasks assessed                                        General Comments      Exercises     Assessment/Plan    PT Assessment Patient needs continued PT services  PT Problem List Decreased activity tolerance;Decreased balance;Decreased mobility;Decreased knowledge of use of DME;Decreased safety awareness;Decreased knowledge of precautions;Pain       PT Treatment Interventions DME instruction;Gait training;Functional mobility training;Therapeutic activities;Therapeutic exercise;Balance training;Patient/family education    PT Goals (Current goals can be found in the Care Plan section)  Acute Rehab PT Goals Patient Stated Goal: to go home PT Goal Formulation: With patient Time For Goal Achievement: 06/16/16 Potential to Achieve Goals: Good    Frequency Min 3X/week   Barriers to discharge        Co-evaluation               End of Session Equipment Utilized During Treatment: Gait belt Activity Tolerance: Patient limited by fatigue Patient left: in chair;with call  bell/phone within reach;with family/visitor present Nurse Communication: Mobility status PT Visit Diagnosis: Unsteadiness on feet (R26.81);Muscle weakness (generalized) (M62.81)    Time: 0865-7846 PT Time Calculation (min) (ACUTE ONLY): 16 min   Charges:   PT Evaluation $PT Eval Moderate Complexity: 1 Procedure     PT G Codes:   PT G-Codes **NOT FOR INPATIENT CLASS** Functional Assessment Tool Used: AM-PAC 6 Clicks Basic Mobility Functional Limitation: Mobility: Walking and moving around Mobility: Walking and Moving Around Current Status (N6295): At least 20 percent but less than 40 percent impaired, limited or restricted Mobility: Walking and Moving Around Goal Status [phone]): At least 1 percent but less than 20 percent impaired, limited or restricted    Thor [phone] (pager)   Denice Paradise 06/02/2016, 2:17 PM

## 2016-06-02 NOTE — Progress Notes (Signed)
ANTICOAGULATION CONSULT NOTE - Initial Consult  Pharmacy Consult for warfarin and lovenox Indication: atrial fibrillation  Allergies  Allergen Reactions  . Erythromycin Base   . Amoxicillin Other (See Comments)    Unknown  . Cefadroxil Other (See Comments)    Unknown  . Ciprofloxacin Other (See Comments)    Unknown  . Erythromycin Other (See Comments)    Upset stomach   Patient Measurements: Height: 5\' 7"  (170.2 cm) Weight: 225 lb (102.1 kg) IBW/kg (Calculated) : 66.1  Vital Signs: Temp: 99 F (37.2 C) (04/04 0225) Temp Source: Oral (04/04 0225) BP: 121/61 (04/04 0615) Pulse Rate: 71 (04/04 0615)  Labs:  Recent Labs  06/02/16 0252  HGB 12.9*  HCT 39.7  PLT 166  LABPROT 19.0*  INR 1.57  CREATININE 1.10    Estimated Creatinine Clearance: 54.9 mL/min (by C-G formula based on SCr of 1.1 mg/dL).   Medical History: Past Medical History:  Diagnosis Date  . Abdominal pain   . Acute renal failure ([employer])     resolved  . Arthritis    "hands & legs" (11/'10/2014)  . Atrial fibrillation ([place])   . Bladder outlet obstruction   . Bladder outlet obstruction   . BPH (benign prostatic hyperplasia)   . CAD (coronary artery disease)    a. CABG IN 1989. b. 01/08/2015 CTO of ost LAD, LIMA to LAD not visualized but assumed patent given myoview finding, occluded SVG to diagonal, 99% mid RCA tx w/ SYNERGY DES 3X28 mm  . Constipation   . Diabetes mellitus type 2, insulin dependent ([place])   . Epistaxis, recurrent Feb 2017  . GERD (gastroesophageal reflux disease)   . HTN (hypertension)   . Hyperlipemia   . Kidney stones   . Mild aortic stenosis   . Rib fractures    left rib fractures being treated with pain medications  . Stented coronary artery Nov 2016   RCA DES  . Urinary tract infection     Enterococcus   Assessment: [name] is a 83 y.o. male with PMH atrial fibrillation on warfarin PTA last dose 4/3. The patient is being held for observation and pharmacy has  been asked to resume warfarin. INR subtherapeutic 1.57  on admission. HgB low 12.9 but stable. The provider would like to bridge with lovenox until INR therapeutic. Since INR low on home regimen will give one time boosted dose tonight to aid in achieving therapeutic level.   PTA warfarin: 5mg  daily except 7.5mg  on Sunday and Wednesday  Goal of Therapy:  INR 2-3 Monitor platelets by anticoagulation protocol: Yes   Plan:  Warfarin 10mg  tonight x1 Lovenox 100mg  q12 hours Daily INR/CBC Monitor for s/sx of bleeding  [name] 06/02/2016,7:42 AM

## 2016-06-02 NOTE — ED Triage Notes (Signed)
BIB EMS from home, reports dizziness X3 days and neck pain. Has been seen at [facility] and given Valium. Pt has orthostatic changes. CBG 195.

## 2016-06-02 NOTE — Progress Notes (Signed)
Report received from [place], [place] for admission to [phone]. Report then given to Ellard Artis, RN on 5W.

## 2016-06-02 NOTE — Progress Notes (Signed)
[name] is a 83 y.o. male patient admitted from ED awake, alert - oriented  X 4 - no acute distress noted.  VSS - Blood pressure 139/83, pulse 97, temperature 97.7 F (36.5 C), temperature source Oral, resp. rate 18, height 5\' 7"  (1.702 m), weight 102.1 kg (225 lb), SpO2 98 %.    IV in place, occlusive dsg intact without redness.  Orientation to room, and floor completed with information packet given to patient/family.  Patient declined safety video at this time.  Admission INP armband ID verified with patient/family, and in place.   Pt in chair with alarm on, fall assessment complete, with patient and family able to verbalize understanding of risk associated with falls, and verbalized understanding to call nsg before up out of bed.  Call light within reach, patient able to voice, and demonstrate understanding.  Skin, clean-dry- intact without evidence of bruising, or skin tears.   No evidence of skin break down noted on exam.     Will cont to eval and treat per MD orders.  Celine Ahr, RN 06/02/2016 3:39 PM

## 2016-06-02 NOTE — ED Provider Notes (Signed)
[facility] DEPT Provider Note   CSN: [id number] Arrival date & time: 06/02/16  0221  By signing my name below, I, Arianna Nassar, attest that this documentation has been prepared under the direction and in the presence of Merryl Hacker, MD.  Electronically Signed: Julien Nordmann, ED Scribe. 06/02/16. 2:44 AM.    History   Chief Complaint Chief Complaint  Patient presents with  . Dizziness   The history is provided by the patient and the EMS personnel. No language interpreter was used.   HPI Comments: [name] is a 82 y.o. male brought in by ambulance, who has a PMhx of acute renal failure, a-fib, CAD, DMII, and mild aortic stenosis presents to the Emergency Department complaining of dizziness that he describes as light-headedness x 3 days. He notes associated left sided neck pain. He reports that he has a hx of vertigo but reports his current presentation of symptoms feel very different. He was seen and evaluated yesterday for symptoms where he was sent for a MRI, which was unremarkable. Pt was diagnosed with new onset Ataxia. He was given Valium for his symptoms and was told to come to the ED if his symptoms did not alleviate. Pt reports this evening, he awoke out of his sleep and was unable to get out of bed due to generalized weakness. He expresses that his neck pain is worse with movement secondary to pain. Pt denies loss of consciousness, chest pain, shortness of breath, loss of appetite, nausea, vomiting, and fever.   PCP: Dr. Billey Chang at [facility] Past Medical History:  Diagnosis Date  . Abdominal pain   . Acute renal failure ([place])     resolved  . Arthritis    "hands & legs" (11/'10/2014)  . Atrial fibrillation ([place])   . Bladder outlet obstruction   . Bladder outlet obstruction   . BPH (benign prostatic hyperplasia)   . CAD (coronary artery disease)    a. CABG IN 1989. b. 01/08/2015 CTO of ost LAD, LIMA to LAD not visualized  but assumed patent given myoview finding, occluded SVG to diagonal, 99% mid RCA tx w/ SYNERGY DES 3X28 mm  . Constipation   . Diabetes mellitus type 2, insulin dependent ([place])   . Epistaxis, recurrent Feb 2017  . GERD (gastroesophageal reflux disease)   . HTN (hypertension)   . Hyperlipemia   . Kidney stones   . Mild aortic stenosis   . Rib fractures    left rib fractures being treated with pain medications  . Stented coronary artery Nov 2016   RCA DES  . Urinary tract infection     Enterococcus    Patient Active Problem List   Diagnosis Date Noted  . Epistaxis, recurrent 04/11/2015  . CAD S/P PCI- Nov 2016   . Abnormal nuclear stress test 01/08/2015  . Vertigo, benign positional 06/25/2014  . Encounter for therapeutic drug monitoring 04/13/2013  . Chronic anticoagulation 09/09/2011  . Chronic atrial fibrillation ([place]) 07/30/2011  . Hx of CABG x 2 1990   . HTN (hypertension)   . Diabetes mellitus type 2, insulin dependent ([place])   . Hyperlipemia   . BPH (benign prostatic hyperplasia)     Past Surgical History:  Procedure Laterality Date  . CARDIAC CATHETERIZATION  1989  . CARDIAC CATHETERIZATION N/A 01/08/2015   Procedure: Left Heart Cath and Cors/Grafts Angiography;  Surgeon: Peter M Martinique, MD;  Location: [facility];  Service: Cardiovascular;  Laterality: N/A;  . CARDIAC  CATHETERIZATION  01/08/2015   Procedure: Coronary Stent Intervention;  Surgeon: Peter M Martinique, MD;  Location: [facility];  Service: Cardiovascular;;  . CARPAL TUNNEL RELEASE Right 08/2010  . CATARACT EXTRACTION W/ INTRAOCULAR LENS  IMPLANT, BILATERAL Bilateral   . CORONARY ANGIOPLASTY  01/08/15   RCA DES  . CORONARY ARTERY BYPASS GRAFT  1989   "CABG X 2"  . JOINT REPLACEMENT    . TONSILLECTOMY    . TOTAL HIP ARTHROPLASTY Right 2000       Home Medications    Prior to Admission medications   Medication Sig Start Date End Date Taking? Authorizing Provider  AVODART 0.5 MG capsule  Take 0.5 mg by mouth every other day.  07/20/11   Historical Provider, MD  clopidogrel (PLAVIX) 75 MG tablet TAKE 1 TABLET BY MOUTH EVERY DAY WITH BREAKFAST 03/12/16   Peter M Martinique, MD  diltiazem (CARDIZEM CD) 360 MG 24 hr capsule Take 1 capsule (360 mg total) by mouth daily. 01/10/15   Peter M Martinique, MD  glucose blood (PRECISION XTRA TEST STRIPS) test strip 1 each by Other route See admin instructions. Check blood sugar once daily 11/08/13   Historical Provider, MD  hydrochlorothiazide (MICROZIDE) 12.5 MG capsule Take 1 capsule by mouth daily. 06/23/15   Historical Provider, MD  insulin glargine (LANTUS) 100 UNIT/ML injection Inject 32 Units into the skin daily.     Historical Provider, MD  lisinopril (PRINIVIL,ZESTRIL) 5 MG tablet Take 1 tablet (5 mg total) by mouth daily. 02/03/16   Peter M Martinique, MD  metFORMIN (GLUCOPHAGE) 1000 MG tablet Take 1 tablet by mouth 2 (two) times daily. 05/20/15   Historical Provider, MD  metoprolol succinate (TOPROL-XL) 100 MG 24 hr tablet Take 1 tablet (100 mg total) by mouth 2 (two) times daily. 06/16/15   Peter M Martinique, MD  nitroGLYCERIN (NITROSTAT) 0.4 MG SL tablet Place 0.4 mg under the tongue every 5 (five) minutes as needed for chest pain (x 3 doses).    Historical Provider, MD  pantoprazole (PROTONIX) 40 MG tablet Take 1 tablet by mouth daily at [facility] 12/22/15   Almyra Deforest, PA  simvastatin (ZOCOR) 80 MG tablet Take 40 mg by mouth at bedtime.     Historical Provider, MD  Tamsulosin HCl (FLOMAX) 0.4 MG CAPS Take 0.4 mg by mouth every other day.     Historical Provider, MD  warfarin (COUMADIN) 5 MG tablet Take 1 to 1.5 tablets by mouth daily as directed by coumadin clinic 04/20/16   Peter M Martinique, MD    Family History Family History  Problem Relation Age of Onset  . Brain cancer Father   . Throat cancer Brother     Social History Social History  Substance Use Topics  . Smoking status: Former Smoker    Packs/day: 3.00    Years: 10.00    Types: Cigarettes      Quit date: 05/11/1958  . Smokeless tobacco: Never Used  . Alcohol use No     Allergies   Erythromycin base; Amoxicillin; Cefadroxil; Ciprofloxacin; and Erythromycin   Review of Systems Review of Systems  Constitutional: Negative for appetite change and fever.  Respiratory: Negative for cough and shortness of breath.   Cardiovascular: Negative for chest pain.  Gastrointestinal: Negative for nausea and vomiting.  Musculoskeletal: Positive for neck pain.  Neurological: Positive for dizziness, weakness and light-headedness. Negative for syncope.  All other systems reviewed and are negative.    Physical Exam Updated Vital Signs BP 123/71  Pulse 96   Temp 99 F (37.2 C) (Oral)   Resp (!) 21   Ht 5\' 7"  (1.702 m)   Wt 225 lb (102.1 kg)   SpO2 96%   BMI 35.24 kg/m   Physical Exam  Constitutional: He is oriented to person, place, and time. No distress.  Appears younger than stated age, no acute distress  HENT:  Head: Normocephalic and atraumatic.  Neck:  Tenderness palpation left sternocleidomastoid, normal range of motion, no meningismus  Cardiovascular: Normal rate, regular rhythm and normal heart sounds.   No murmur heard. Pulmonary/Chest: Effort normal and breath sounds normal. No respiratory distress. He has no wheezes.  Abdominal: Soft. Bowel sounds are normal. There is no tenderness. There is no rebound.  Musculoskeletal: He exhibits no edema.  Neurological: He is alert and oriented to person, place, and time.  Cranial nerves II through XII intact, 5 out of 5 strength in all 4 extremities, no dysmetria to finger-nose-finger, no drift, gait testing deferred  Skin: Skin is warm and dry.  Psychiatric: He has a normal mood and affect.  Nursing note and vitals reviewed.    ED Treatments / Results  DIAGNOSTIC STUDIES: Oxygen Saturation is 96% on RA, adequate by my interpretation.  COORDINATION OF CARE:  2:43 AM Discussed treatment plan with pt at bedside and pt  agreed to plan.  Labs (all labs ordered are listed, but only abnormal results are displayed) Labs Reviewed  CBC WITH DIFFERENTIAL/PLATELET - Abnormal; Notable for the following:       Result Value   WBC 16.6 (*)    Hemoglobin 12.9 (*)    Neutro Abs 13.9 (*)    Monocytes Absolute 1.4 (*)    All other components within normal limits  BASIC METABOLIC PANEL - Abnormal; Notable for the following:    Glucose, Bld 165 (*)    GFR calc non Af Amer 59 (*)    All other components within normal limits  URINALYSIS, ROUTINE W REFLEX MICROSCOPIC - Abnormal; Notable for the following:    Glucose, UA >=500 (*)    Hgb urine dipstick SMALL (*)    Ketones, ur 5 (*)    Bacteria, UA RARE (*)    Squamous Epithelial / LPF 0-5 (*)    All other components within normal limits  PROTIME-INR - Abnormal; Notable for the following:    Prothrombin Time 19.0 (*)    All other components within normal limits  CULTURE, BLOOD (ROUTINE X 2)  CULTURE, BLOOD (ROUTINE X 2)  I-STAT TROPOININ, ED    EKG  EKG Interpretation  Date/Time:  Wednesday June 02 2016 02:52:17 EDT Ventricular Rate:  84 PR Interval:    QRS Duration: 95 QT Interval:  345 QTC Calculation: 408 R Axis:   64 Text Interpretation:  Atrial fibrillation Borderline repolarization abnormality No significant change since last tracing Confirmed by Bridgette Wolden  MD, Khala Tarte ([id number]) on 06/02/2016 2:55:06 AM       Radiology Ct Angio Head W Or Wo Contrast  Result Date: 06/02/2016 CLINICAL DATA:  83 y/o  M; dizziness and neck pain. EXAM: CT ANGIOGRAPHY HEAD AND NECK TECHNIQUE: Multidetector CT imaging of the head and neck was performed using the standard protocol during bolus administration of intravenous contrast. Multiplanar CT image reconstructions and MIPs were obtained to evaluate the vascular anatomy. Carotid stenosis measurements (when applicable) are obtained utilizing NASCET criteria, using the distal internal carotid diameter as the denominator.  CONTRAST:  50 cc Isovue 370 COMPARISON:  None. FINDINGS: CT HEAD  FINDINGS Brain: No evidence of acute infarction, hemorrhage, hydrocephalus, extra-axial collection or mass lesion/mass effect. Mild chronic microvascular ischemic changes and parenchymal volume loss of the brain. Vascular: As below. Skull: Normal. Negative for fracture or focal lesion. Sinuses: Imaged portions are clear. Orbits: Bilateral intra-ocular lens replacement. Review of the MIP images confirms the above findings CTA NECK FINDINGS Aortic arch: 4 vessel arch. Moderate calcific atherosclerosis. No significant stenosis of great vessel origins. Right carotid system: No evidence of dissection or occlusion. Dense calcific atherosclerosis of the carotid bifurcation and proximal ICA with moderate 60% stenosis of proximal ICA. Hairpin turn of upper cervical segment of ICA. Left carotid system: No evidence of dissection, stenosis (50% or greater) or occlusion. Dense calcific atherosclerosis of the carotid bifurcation without high-grade stenosis. Hairpin turn of upper cervical segment of ICA. Vertebral arteries: Codominant. No evidence of dissection, stenosis (50% or greater) or occlusion. Skeleton: Advanced cervical spondylosis with interbody fusion of the C5 through C7 vertebral bodies, ossification of posterior longitudinal ligament, and mild to moderate bony canal stenosis through those levels. No acute osseous abnormality is evident. Partially visualize median sternotomy postsurgical changes. Other neck: Negative. Upper chest: Right upper lobe patchy consolidation likely represents pneumonia. Review of the MIP images confirms the above findings CTA HEAD FINDINGS Anterior circulation: No significant stenosis, proximal occlusion, aneurysm, or vascular malformation. Extensive calcific atherosclerosis of cavernous and paraclinoid internal carotid arteries without significant stenosis. Posterior circulation: Right V4 segment calcified plaque with mild  stenosis. Small caliber basilar artery is likely due to variant anatomy of bilateral fetal PCA. Venous sinuses: As permitted by contrast timing, patent. Anatomic variants: Bilateral fetal posterior cerebral arteries and small anterior communicating artery. Delayed phase: No abnormal intracranial enhancement. Review of the MIP images confirms the above findings IMPRESSION: 1. No acute intracranial abnormality identified on noncontrast CT of head. After administration of intravenous contrast there is no abnormal enhancement. 2. Mild chronic microvascular ischemic changes and mild parenchymal volume loss of the brain. 3. Patent carotid and vertebral arteries of the neck without evidence for dissection, aneurysm, or occlusion. 4. Right proximal ICA moderate 60% stenosis. 5. Patent circle of Willis without evidence for proximal occlusion, high-grade stenosis, aneurysm, or vascular malformation. 6. Right upper lobe patchy consolidation likely represents pneumonia. These results were called by telephone at the time of interpretation on 06/02/2016 at 5:16 am to Dr. Thayer Jew , who verbally acknowledged these results. Electronically Signed   By: Kristine Garbe M.D.   On: 06/02/2016 05:18   Ct Angio Neck W And/or Wo Contrast  Result Date: 06/02/2016 CLINICAL DATA:  83 y/o  M; dizziness and neck pain. EXAM: CT ANGIOGRAPHY HEAD AND NECK TECHNIQUE: Multidetector CT imaging of the head and neck was performed using the standard protocol during bolus administration of intravenous contrast. Multiplanar CT image reconstructions and MIPs were obtained to evaluate the vascular anatomy. Carotid stenosis measurements (when applicable) are obtained utilizing NASCET criteria, using the distal internal carotid diameter as the denominator. CONTRAST:  50 cc Isovue 370 COMPARISON:  None. FINDINGS: CT HEAD FINDINGS Brain: No evidence of acute infarction, hemorrhage, hydrocephalus, extra-axial collection or mass lesion/mass  effect. Mild chronic microvascular ischemic changes and parenchymal volume loss of the brain. Vascular: As below. Skull: Normal. Negative for fracture or focal lesion. Sinuses: Imaged portions are clear. Orbits: Bilateral intra-ocular lens replacement. Review of the MIP images confirms the above findings CTA NECK FINDINGS Aortic arch: 4 vessel arch. Moderate calcific atherosclerosis. No significant stenosis of great vessel origins. Right carotid  system: No evidence of dissection or occlusion. Dense calcific atherosclerosis of the carotid bifurcation and proximal ICA with moderate 60% stenosis of proximal ICA. Hairpin turn of upper cervical segment of ICA. Left carotid system: No evidence of dissection, stenosis (50% or greater) or occlusion. Dense calcific atherosclerosis of the carotid bifurcation without high-grade stenosis. Hairpin turn of upper cervical segment of ICA. Vertebral arteries: Codominant. No evidence of dissection, stenosis (50% or greater) or occlusion. Skeleton: Advanced cervical spondylosis with interbody fusion of the C5 through C7 vertebral bodies, ossification of posterior longitudinal ligament, and mild to moderate bony canal stenosis through those levels. No acute osseous abnormality is evident. Partially visualize median sternotomy postsurgical changes. Other neck: Negative. Upper chest: Right upper lobe patchy consolidation likely represents pneumonia. Review of the MIP images confirms the above findings CTA HEAD FINDINGS Anterior circulation: No significant stenosis, proximal occlusion, aneurysm, or vascular malformation. Extensive calcific atherosclerosis of cavernous and paraclinoid internal carotid arteries without significant stenosis. Posterior circulation: Right V4 segment calcified plaque with mild stenosis. Small caliber basilar artery is likely due to variant anatomy of bilateral fetal PCA. Venous sinuses: As permitted by contrast timing, patent. Anatomic variants: Bilateral fetal  posterior cerebral arteries and small anterior communicating artery. Delayed phase: No abnormal intracranial enhancement. Review of the MIP images confirms the above findings IMPRESSION: 1. No acute intracranial abnormality identified on noncontrast CT of head. After administration of intravenous contrast there is no abnormal enhancement. 2. Mild chronic microvascular ischemic changes and mild parenchymal volume loss of the brain. 3. Patent carotid and vertebral arteries of the neck without evidence for dissection, aneurysm, or occlusion. 4. Right proximal ICA moderate 60% stenosis. 5. Patent circle of Willis without evidence for proximal occlusion, high-grade stenosis, aneurysm, or vascular malformation. 6. Right upper lobe patchy consolidation likely represents pneumonia. These results were called by telephone at the time of interpretation on 06/02/2016 at 5:16 am to Dr. Thayer Jew , who verbally acknowledged these results. Electronically Signed   By: Kristine Garbe M.D.   On: 06/02/2016 05:18    Procedures Procedures (including critical care time)  Medications Ordered in ED Medications  doxycycline (VIBRA-TABS) tablet 100 mg (not administered)  meclizine (ANTIVERT) tablet 25 mg (25 mg Oral Given 06/02/16 0312)  acetaminophen (TYLENOL) tablet 650 mg (650 mg Oral Given 06/02/16 0423)  iopamidol (ISOVUE-370) 76 % injection (50 mLs  Contrast Given 06/02/16 0437)  sodium chloride 0.9 % bolus 1,000 mL (1,000 mLs Intravenous New Bag/Given 06/02/16 0533)     Initial Impression / Assessment and Plan / ED Course  I have reviewed the triage vital signs and the nursing notes.  Pertinent labs & imaging results that were available during my care of the patient were reviewed by me and considered in my medical decision making (see chart for details).     Patient presents with persistently worsening dizziness and weakness which is atypical. He does have a history of vertigo but states that this feels  different. He is seen by his primary physician who documented new onset ataxia. He reports a negative MRI. I cannot confirm this.  He is nontoxic and nonfocal. Vital signs reassuring. Denies infectious symptoms or other neurologic deficits. He does report neck pain. No meningismus.  CT a head and neck obtained to evaluate for critical stenosis of the vertebral arteries. This is negative. He does have a leukocytosis. On CT scan there is question of a patchy right upper lobe opacity concerning for pneumonia. He does have a leukocytosis. Given this,  will elect to treat. Upon attempting to ambulate, patient is unable to ambulate independently. He reports worsening dizziness upon standing. He is orthostatic with dropping his blood pressure and increasing his heart rate from the mid 50s to 90s. Patient given fluids. Given that he is unable to ambulate independently, will admit for hydration, antibiotics, and physical therapy.   Final Clinical Impressions(s) / ED Diagnoses   Final diagnoses:  Dizziness  Community acquired pneumonia of right upper lobe of lung ([place])  Orthostasis   I personally performed the services described in this documentation, which was scribed in my presence. The recorded information has been reviewed and is accurate.  New Prescriptions New Prescriptions   No medications on file     Merryl Hacker, MD 06/02/16 [phone]

## 2016-06-02 NOTE — H&P (Signed)
History and Physical    [name] WGY:[id number] DOB: Oct 09, 1928 DOA: 06/02/2016  PCP: [name], MD Patient coming from: home  Chief Complaint: light headedness, neck pain  HPI: [name] is a very pleasant 83 y.o. male with medical history significant for attention, hyperlipidemia, diabetes, CAD status post stent placement, A. fib on Coumadin, vertigo presents to the emergency Department chief complaint of lightheadedness/dizziness and neck pain. Initial evaluation reveals orthostatic hypotension and imaging concerning for early pneumonia  Information is obtained from the patient. He states over the last 3 days he has had worsening dizziness/lightheadedness and left-sided neck pain. He does have a history of vertigo but he says this is different in that it is constant and his vertigo usually comes and goes. He does report worsening with head movement. Was seen yesterday and had an MRI which he reports was unremarkable. He was diagnosed with new ataxia for which he was given valium. He was told to report to the emergency department if his symptoms do not improve after 48 hours. When he awakened during the night last night he was so weak he couldn't get out of bed so he decided to come to the hospital. He denies headache visual disturbances numbness tingling of extremities. He denies difficulty chewing or swallowing. He denies chest pain palpitation shortness of breath fever chills cough diarrhea constipation. He denies dysuria hematuria frequency or urgency. He denies any recent falls or injuries. Denies lower extremity edema or orthopnea. He does report a gradual unintentional weight loss over the last 45 months of approximately 20 pounds.   ED Course: In the emergency department max temp 99 blood pressure high end of normal patient not hypoxic. He is orthostatic heart rate increased from 50-90 as well as a drop in his blood pressure. He was provided with IV fluids. He was unable to  ambulate in the emergency department  Review of Systems: As per HPI otherwise 10 point review of systems negative.   Ambulatory Status: Ambulates independently no recent falls or injury. Independent with ADLs  Past Medical History:  Diagnosis Date  . Abdominal pain   . Acute renal failure ([place])     resolved  . Arthritis    "hands & legs" (11/'10/2014)  . Ataxia   . Atrial fibrillation ([place])   . Bladder outlet obstruction   . Bladder outlet obstruction   . BPH (benign prostatic hyperplasia)   . CAD (coronary artery disease)    a. CABG IN 1989. b. 01/08/2015 CTO of ost LAD, LIMA to LAD not visualized but assumed patent given myoview finding, occluded SVG to diagonal, 99% mid RCA tx w/ SYNERGY DES 3X28 mm  . Constipation   . Diabetes mellitus type 2, insulin dependent ([place])   . Epistaxis, recurrent Feb 2017  . GERD (gastroesophageal reflux disease)   . HTN (hypertension)   . Hyperlipemia   . Kidney stones   . Mild aortic stenosis   . Rib fractures    left rib fractures being treated with pain medications  . Stented coronary artery Nov 2016   RCA DES  . Urinary tract infection     Enterococcus    Past Surgical History:  Procedure Laterality Date  . CARDIAC CATHETERIZATION  1989  . CARDIAC CATHETERIZATION N/A 01/08/2015   Procedure: Left Heart Cath and Cors/Grafts Angiography;  Surgeon: Peter M Martinique, MD;  Location: [facility];  Service: Cardiovascular;  Laterality: N/A;  . CARDIAC CATHETERIZATION  01/08/2015   Procedure: Coronary Stent Intervention;  Surgeon: Peter M Martinique, MD;  Location: [facility];  Service: Cardiovascular;;  . CARPAL TUNNEL RELEASE Right 08/2010  . CATARACT EXTRACTION W/ INTRAOCULAR LENS  IMPLANT, BILATERAL Bilateral   . CORONARY ANGIOPLASTY  01/08/15   RCA DES  . CORONARY ARTERY BYPASS GRAFT  1989   "CABG X 2"  . JOINT REPLACEMENT    . TONSILLECTOMY    . TOTAL HIP ARTHROPLASTY Right 2000    Social History   Social History  .  Marital status: Married    Spouse name: N/A  . Number of children: 3  . Years of education: N/A   Occupational History  . Agricultural consultant    Social History Main Topics  . Smoking status: Former Smoker    Packs/day: 3.00    Years: 10.00    Types: Cigarettes    Quit date: 05/11/1958  . Smokeless tobacco: Never Used  . Alcohol use No  . Drug use: No  . Sexual activity: Not Currently   Other Topics Concern  . Not on file   Social History Narrative  . No narrative on file    Allergies  Allergen Reactions  . Erythromycin Base   . Amoxicillin Other (See Comments)    Unknown  . Cefadroxil Other (See Comments)    Unknown  . Ciprofloxacin Other (See Comments)    Unknown  . Erythromycin Other (See Comments)    Upset stomach    Family History  Problem Relation Age of Onset  . Brain cancer Father   . Throat cancer Brother     Prior to Admission medications   Medication Sig Start Date End Date Taking? Authorizing Provider  AVODART 0.5 MG capsule Take 0.5 mg by mouth every other day.  07/20/11  Yes Historical Provider, MD  diltiazem (CARDIZEM CD) 360 MG 24 hr capsule Take 1 capsule (360 mg total) by mouth daily. 01/10/15  Yes Peter M Martinique, MD  empagliflozin (JARDIANCE) 10 MG TABS tablet Take 10 mg by mouth daily.   Yes Historical Provider, MD  insulin glargine (LANTUS) 100 UNIT/ML injection Inject 34 Units into the skin daily.    Yes Historical Provider, MD  lisinopril (PRINIVIL,ZESTRIL) 5 MG tablet Take 1 tablet (5 mg total) by mouth daily. 02/03/16  Yes Peter M Martinique, MD  metFORMIN (GLUCOPHAGE) 1000 MG tablet Take 1 tablet by mouth 2 (two) times daily. 05/20/15  Yes Historical Provider, MD  metoprolol succinate (TOPROL-XL) 100 MG 24 hr tablet Take 1 tablet (100 mg total) by mouth 2 (two) times daily. 06/16/15  Yes Peter M Martinique, MD  nitroGLYCERIN (NITROSTAT) 0.4 MG SL tablet Place 0.4 mg under the tongue every 5 (five) minutes as needed for chest pain (x 3 doses).   Yes  Historical Provider, MD  pantoprazole (PROTONIX) 40 MG tablet Take 1 tablet by mouth daily at [facility] 12/22/15  Yes Almyra Deforest, PA  simvastatin (ZOCOR) 80 MG tablet Take 40 mg by mouth at bedtime.    Yes Historical Provider, MD  Tamsulosin HCl (FLOMAX) 0.4 MG CAPS Take 0.4 mg by mouth every other day.    Yes Historical Provider, MD  warfarin (COUMADIN) 5 MG tablet Take 1 to 1.5 tablets by mouth daily as directed by coumadin clinic Patient taking differently: Take 5-7.5 mg by mouth daily. Take 5mg  daily except Sunday and Wednesday take 7.5mg  04/20/16  Yes Peter M Martinique, MD    Physical Exam: Vitals:   06/02/16 0545 06/02/16 0600 06/02/16 0615 06/02/16 0743  BP: 118/73 134/75  121/61 136/78  Pulse: 83 76 71 79  Resp: 19 19 19 20   Temp:      TempSrc:      SpO2:    96%  Weight:      Height:         General:  Appears calm and comfortable Eyes:  PERRL, EOMI, normal lids, iris ENT:  grossly normal hearing, lips & tongue, Mucous membranes of his mouth are pink but dry Neck:  no LAD, no lymphadenopathy decreased range of motion due to pain, tenderness left posterior neck. Small acorn size enlargement palpated. No eyrthema no tenderness Cardiovascular:  RRR, no m/r/g. No LE edema.  Respiratory:  CTA bilaterally, no w/r/r. Normal respiratory effort. Abdomen:  soft, ntnd, positive bowel sounds Skin:  no rash or induration seen on limited exam Musculoskeletal:  grossly normal tone BUE/BLE, good ROM, no bony abnormality Psychiatric:  grossly normal mood and affect, speech fluent and appropriate, AOx3 Neurologic:  CN 2-12 grossly intact, moves all extremities in coordinated fashion, sensation intact  Labs on Admission: I have personally reviewed following labs and imaging studies  CBC:  Recent Labs Lab 06/02/16 0252  WBC 16.6*  NEUTROABS 13.9*  HGB 12.9*  HCT 39.7  MCV 83.4  PLT 811   Basic Metabolic Panel:  Recent Labs Lab 06/02/16 0252  NA 138  K 4.0  CL 103  CO2 25  GLUCOSE 165*   BUN 17  CREATININE 1.10  CALCIUM 9.3   GFR: Estimated Creatinine Clearance: 54.9 mL/min (by C-G formula based on SCr of 1.1 mg/dL). Liver Function Tests: No results for input(s): AST, ALT, ALKPHOS, BILITOT, PROT, ALBUMIN in the last 168 hours. No results for input(s): LIPASE, AMYLASE in the last 168 hours. No results for input(s): AMMONIA in the last 168 hours. Coagulation Profile:  Recent Labs Lab 06/02/16 0252  INR 1.57   Cardiac Enzymes: No results for input(s): CKTOTAL, CKMB, CKMBINDEX, TROPONINI in the last 168 hours. BNP (last 3 results) No results for input(s): PROBNP in the last 8760 hours. HbA1C: No results for input(s): HGBA1C in the last 72 hours. CBG: No results for input(s): GLUCAP in the last 168 hours. Lipid Profile: No results for input(s): CHOL, HDL, LDLCALC, TRIG, CHOLHDL, LDLDIRECT in the last 72 hours. Thyroid Function Tests: No results for input(s): TSH, T4TOTAL, FREET4, T3FREE, THYROIDAB in the last 72 hours. Anemia Panel: No results for input(s): VITAMINB12, FOLATE, FERRITIN, TIBC, IRON, RETICCTPCT in the last 72 hours. Urine analysis:    Component Value Date/Time   COLORURINE YELLOW 06/02/2016 0315   APPEARANCEUR CLEAR 06/02/2016 0315   LABSPEC 1.026 06/02/2016 0315   PHURINE 5.0 06/02/2016 0315   GLUCOSEU >=500 (A) 06/02/2016 0315   HGBUR SMALL (A) 06/02/2016 0315   BILIRUBINUR NEGATIVE 06/02/2016 0315   KETONESUR 5 (A) 06/02/2016 0315   PROTEINUR NEGATIVE 06/02/2016 0315   UROBILINOGEN 0.2 11/28/2010 1156   NITRITE NEGATIVE 06/02/2016 0315   LEUKOCYTESUR NEGATIVE 06/02/2016 0315    Creatinine Clearance: Estimated Creatinine Clearance: 54.9 mL/min (by C-G formula based on SCr of 1.1 mg/dL).  Sepsis Labs: @LABRCNTIP (procalcitonin:4,lacticidven:4) )No results found for this or any previous visit (from the past 240 hour(s)).   Radiological Exams on Admission: Ct Angio Head W Or Wo Contrast  Result Date: 06/02/2016 CLINICAL DATA:  83 y/o   M; dizziness and neck pain. EXAM: CT ANGIOGRAPHY HEAD AND NECK TECHNIQUE: Multidetector CT imaging of the head and neck was performed using the standard protocol during bolus administration of intravenous contrast. Multiplanar CT  image reconstructions and MIPs were obtained to evaluate the vascular anatomy. Carotid stenosis measurements (when applicable) are obtained utilizing NASCET criteria, using the distal internal carotid diameter as the denominator. CONTRAST:  50 cc Isovue 370 COMPARISON:  None. FINDINGS: CT HEAD FINDINGS Brain: No evidence of acute infarction, hemorrhage, hydrocephalus, extra-axial collection or mass lesion/mass effect. Mild chronic microvascular ischemic changes and parenchymal volume loss of the brain. Vascular: As below. Skull: Normal. Negative for fracture or focal lesion. Sinuses: Imaged portions are clear. Orbits: Bilateral intra-ocular lens replacement. Review of the MIP images confirms the above findings CTA NECK FINDINGS Aortic arch: 4 vessel arch. Moderate calcific atherosclerosis. No significant stenosis of great vessel origins. Right carotid system: No evidence of dissection or occlusion. Dense calcific atherosclerosis of the carotid bifurcation and proximal ICA with moderate 60% stenosis of proximal ICA. Hairpin turn of upper cervical segment of ICA. Left carotid system: No evidence of dissection, stenosis (50% or greater) or occlusion. Dense calcific atherosclerosis of the carotid bifurcation without high-grade stenosis. Hairpin turn of upper cervical segment of ICA. Vertebral arteries: Codominant. No evidence of dissection, stenosis (50% or greater) or occlusion. Skeleton: Advanced cervical spondylosis with interbody fusion of the C5 through C7 vertebral bodies, ossification of posterior longitudinal ligament, and mild to moderate bony canal stenosis through those levels. No acute osseous abnormality is evident. Partially visualize median sternotomy postsurgical changes. Other  neck: Negative. Upper chest: Right upper lobe patchy consolidation likely represents pneumonia. Review of the MIP images confirms the above findings CTA HEAD FINDINGS Anterior circulation: No significant stenosis, proximal occlusion, aneurysm, or vascular malformation. Extensive calcific atherosclerosis of cavernous and paraclinoid internal carotid arteries without significant stenosis. Posterior circulation: Right V4 segment calcified plaque with mild stenosis. Small caliber basilar artery is likely due to variant anatomy of bilateral fetal PCA. Venous sinuses: As permitted by contrast timing, patent. Anatomic variants: Bilateral fetal posterior cerebral arteries and small anterior communicating artery. Delayed phase: No abnormal intracranial enhancement. Review of the MIP images confirms the above findings IMPRESSION: 1. No acute intracranial abnormality identified on noncontrast CT of head. After administration of intravenous contrast there is no abnormal enhancement. 2. Mild chronic microvascular ischemic changes and mild parenchymal volume loss of the brain. 3. Patent carotid and vertebral arteries of the neck without evidence for dissection, aneurysm, or occlusion. 4. Right proximal ICA moderate 60% stenosis. 5. Patent circle of Willis without evidence for proximal occlusion, high-grade stenosis, aneurysm, or vascular malformation. 6. Right upper lobe patchy consolidation likely represents pneumonia. These results were called by telephone at the time of interpretation on 06/02/2016 at 5:16 am to Dr. Thayer Jew , who verbally acknowledged these results. Electronically Signed   By: Kristine Garbe M.D.   On: 06/02/2016 05:18   Ct Angio Neck W And/or Wo Contrast  Result Date: 06/02/2016 CLINICAL DATA:  83 y/o  M; dizziness and neck pain. EXAM: CT ANGIOGRAPHY HEAD AND NECK TECHNIQUE: Multidetector CT imaging of the head and neck was performed using the standard protocol during bolus administration  of intravenous contrast. Multiplanar CT image reconstructions and MIPs were obtained to evaluate the vascular anatomy. Carotid stenosis measurements (when applicable) are obtained utilizing NASCET criteria, using the distal internal carotid diameter as the denominator. CONTRAST:  50 cc Isovue 370 COMPARISON:  None. FINDINGS: CT HEAD FINDINGS Brain: No evidence of acute infarction, hemorrhage, hydrocephalus, extra-axial collection or mass lesion/mass effect. Mild chronic microvascular ischemic changes and parenchymal volume loss of the brain. Vascular: As below. Skull: Normal. Negative for fracture  or focal lesion. Sinuses: Imaged portions are clear. Orbits: Bilateral intra-ocular lens replacement. Review of the MIP images confirms the above findings CTA NECK FINDINGS Aortic arch: 4 vessel arch. Moderate calcific atherosclerosis. No significant stenosis of great vessel origins. Right carotid system: No evidence of dissection or occlusion. Dense calcific atherosclerosis of the carotid bifurcation and proximal ICA with moderate 60% stenosis of proximal ICA. Hairpin turn of upper cervical segment of ICA. Left carotid system: No evidence of dissection, stenosis (50% or greater) or occlusion. Dense calcific atherosclerosis of the carotid bifurcation without high-grade stenosis. Hairpin turn of upper cervical segment of ICA. Vertebral arteries: Codominant. No evidence of dissection, stenosis (50% or greater) or occlusion. Skeleton: Advanced cervical spondylosis with interbody fusion of the C5 through C7 vertebral bodies, ossification of posterior longitudinal ligament, and mild to moderate bony canal stenosis through those levels. No acute osseous abnormality is evident. Partially visualize median sternotomy postsurgical changes. Other neck: Negative. Upper chest: Right upper lobe patchy consolidation likely represents pneumonia. Review of the MIP images confirms the above findings CTA HEAD FINDINGS Anterior circulation:  No significant stenosis, proximal occlusion, aneurysm, or vascular malformation. Extensive calcific atherosclerosis of cavernous and paraclinoid internal carotid arteries without significant stenosis. Posterior circulation: Right V4 segment calcified plaque with mild stenosis. Small caliber basilar artery is likely due to variant anatomy of bilateral fetal PCA. Venous sinuses: As permitted by contrast timing, patent. Anatomic variants: Bilateral fetal posterior cerebral arteries and small anterior communicating artery. Delayed phase: No abnormal intracranial enhancement. Review of the MIP images confirms the above findings IMPRESSION: 1. No acute intracranial abnormality identified on noncontrast CT of head. After administration of intravenous contrast there is no abnormal enhancement. 2. Mild chronic microvascular ischemic changes and mild parenchymal volume loss of the brain. 3. Patent carotid and vertebral arteries of the neck without evidence for dissection, aneurysm, or occlusion. 4. Right proximal ICA moderate 60% stenosis. 5. Patent circle of Willis without evidence for proximal occlusion, high-grade stenosis, aneurysm, or vascular malformation. 6. Right upper lobe patchy consolidation likely represents pneumonia. These results were called by telephone at the time of interpretation on 06/02/2016 at 5:16 am to Dr. Thayer Jew , who verbally acknowledged these results. Electronically Signed   By: Kristine Garbe M.D.   On: 06/02/2016 05:18    EKG: Independently reviewed. Atrial fibrillation Borderline repolarization abnormality  Assessment/Plan Principal Problem:   CAP (community acquired pneumonia) Active Problems:   HTN (hypertension)   Diabetes mellitus type 2, insulin dependent ([facility])   BPH (benign prostatic hyperplasia)   Chronic atrial fibrillation ([facility])   Chronic anticoagulation   Vertigo, benign positional   CAD S/P PCI- Nov 2016   Dehydration   Leukocytosis   Acute muscle  stiffness of neck   Ataxia   Dizziness   Carotid stenosis   #1. Community-acquired pneumonia. CT neck reveals right upper lobe patchy consolidation, leukocytosis, max temp 99. Oxygen saturation level greater than 90% on room air. Divided with doxycycline in the emergency department -Admit -Follow blood cultures -Obtain sputum cultures as able -Obtain strep pneumo urine antigen -Obtain influenza panel -Gentle IV fluids -Continue doxycycline  #2. Dehydration/orthostatic hypotension.  Likely related to decreased oral intake in the setting of above.  -gently IV fluids -orthostatic VS  #3. Dizziness. Likely related to above. Does have a history of vertigo but indicates that this is different in that vertigo typically comes and goes this is been more continuous. CT of the head and neck as noted above. She was orthostatic.  MRI yesterday without acute abnormality. Neuro exam benign -IV fluids -physical therapy -meclazine  4. Acute muscle stiffness of neck. No recent injury. Acorn sized knot noted on palpation. Non tender. Discussed with radiologist who opine likely benign lipoma vs cyst.  Imaging as noted above.  -robaxin -toradol -warm moist compresses -physical therapy -encourage rom -OP follow up with PCP  #5. A. fib. Home meds include Coumadin. INR 1.9. [place] score 5. Rate controlled -Monitor on telemetry -Continue Cardizem -Hold beta blocker until tomorrow -coumadin per pharmacy  #6. Diabetes. Serum glucose 165 on admission Home medications include oral agents and Lantus. -Continue Lantus at a slightly lower dose -Hold oral agents for now -Sliding scale insulin for optimal control  #7. Hypertension. Fair control in the emergency department. Home medications include lisinopril, Cardizem, metoprolol -Continue Cardizem hold lisinopril -Hold metoprolol until tomorrow -Monitor closely  #8. CAD. Status post remote CABG. Status post DES of RCA 2016. No chest pain. EKG as noted  above. -Continue statin  #9. Vertigo. Patient with a history of same. -consider meclazine -Physical therapy  #10. BPH. -Continue home meds  #11. Ataxia. Newly diagnosed per patient. Recent MRI without acute infarct. He was prescribed valium per PCP recently.  -physical therapy -hold valium for now  #12. Carotid stenosis. Right. Per CTA 60% -OP follow up   DVT prophylaxis: coumadin  Code Status: full  Family Communication: wife at bedside  Disposition Plan: home hopefully 24 hours  Consults called: none  Admission status: obs    Radene Gunning MD [facility]  If 7PM-7AM, please contact night-coverage [email or web address] Password [email or web address]  06/02/2016, 9:07 AM

## 2016-06-02 NOTE — Telephone Encounter (Signed)
-----   Message from Mena Goes, RN sent at 06/02/2016 11:25 AM EDT ----- Regarding: 6 months w/ carotid duplex   ----- Message ----- From: Elam Dutch, MD Sent: 06/02/2016  11:11 AM To: [facility] Charge Pool  Level 4 consult for carotid stenosis and dizziness Dr Arvid Right ER  Pt needs NP/PA visit and carotid duplex 6 months.  Please contact him regarding appt.  Pt does not need to go on list  Ruta Hinds

## 2016-06-03 DIAGNOSIS — R42 Dizziness and giddiness: Secondary | ICD-10-CM | POA: Diagnosis not present

## 2016-06-03 DIAGNOSIS — J181 Lobar pneumonia, unspecified organism: Secondary | ICD-10-CM

## 2016-06-03 DIAGNOSIS — I10 Essential (primary) hypertension: Secondary | ICD-10-CM

## 2016-06-03 DIAGNOSIS — I2510 Atherosclerotic heart disease of native coronary artery without angina pectoris: Secondary | ICD-10-CM

## 2016-06-03 DIAGNOSIS — E860 Dehydration: Secondary | ICD-10-CM

## 2016-06-03 DIAGNOSIS — Z7901 Long term (current) use of anticoagulants: Secondary | ICD-10-CM

## 2016-06-03 DIAGNOSIS — M542 Cervicalgia: Secondary | ICD-10-CM | POA: Diagnosis not present

## 2016-06-03 DIAGNOSIS — I951 Orthostatic hypotension: Secondary | ICD-10-CM | POA: Diagnosis not present

## 2016-06-03 DIAGNOSIS — I6521 Occlusion and stenosis of right carotid artery: Secondary | ICD-10-CM

## 2016-06-03 DIAGNOSIS — E119 Type 2 diabetes mellitus without complications: Secondary | ICD-10-CM | POA: Diagnosis not present

## 2016-06-03 DIAGNOSIS — R270 Ataxia, unspecified: Secondary | ICD-10-CM | POA: Diagnosis not present

## 2016-06-03 DIAGNOSIS — Z9861 Coronary angioplasty status: Secondary | ICD-10-CM

## 2016-06-03 DIAGNOSIS — N400 Enlarged prostate without lower urinary tract symptoms: Secondary | ICD-10-CM

## 2016-06-03 DIAGNOSIS — Z794 Long term (current) use of insulin: Secondary | ICD-10-CM

## 2016-06-03 LAB — CBC
HCT: 38.1 % — ABNORMAL LOW (ref 39.0–52.0)
Hemoglobin: 12.3 g/dL — ABNORMAL LOW (ref 13.0–17.0)
MCH: 27.2 pg (ref 26.0–34.0)
MCHC: 32.3 g/dL (ref 30.0–36.0)
MCV: 84.1 fL (ref 78.0–100.0)
Platelets: 189 10*3/uL (ref 150–400)
RBC: 4.53 MIL/uL (ref 4.22–5.81)
RDW: 14.6 % (ref 11.5–15.5)
WBC: 11.9 10*3/uL — ABNORMAL HIGH (ref 4.0–10.5)

## 2016-06-03 LAB — HEMOGLOBIN A1C
Hgb A1c MFr Bld: 9 % — ABNORMAL HIGH (ref 4.8–5.6)
Mean Plasma Glucose: 212 mg/dL

## 2016-06-03 LAB — GLUCOSE, CAPILLARY
Glucose-Capillary: 166 mg/dL — ABNORMAL HIGH (ref 65–99)
Glucose-Capillary: 216 mg/dL — ABNORMAL HIGH (ref 65–99)

## 2016-06-03 LAB — BASIC METABOLIC PANEL
Anion gap: 9 (ref 5–15)
BUN: 21 mg/dL — ABNORMAL HIGH (ref 6–20)
CO2: 23 mmol/L (ref 22–32)
Calcium: 9 mg/dL (ref 8.9–10.3)
Chloride: 106 mmol/L (ref 101–111)
Creatinine, Ser: 1.22 mg/dL (ref 0.61–1.24)
GFR calc Af Amer: >60 mL/min (ref >60)
GFR calc non Af Amer: 52 mL/min — ABNORMAL LOW (ref >60)
Glucose, Bld: 201 mg/dL — ABNORMAL HIGH (ref 65–99)
Potassium: 4.1 mmol/L (ref 3.5–5.1)
Sodium: 138 mmol/L (ref 135–145)

## 2016-06-03 LAB — PROTIME-INR
INR: 1.8
Prothrombin Time: 21.1 seconds — ABNORMAL HIGH (ref 11.4–15.2)

## 2016-06-03 MED ORDER — WARFARIN SODIUM 7.5 MG PO TABS: 7.5000 mg | ORAL_TABLET | Freq: Once | ORAL | Status: DC

## 2016-06-03 MED ORDER — DOXYCYCLINE HYCLATE 100 MG PO TABS: 100.0000 mg | ORAL_TABLET | Freq: Two times a day (BID) | ORAL | 0 refills | Status: DC

## 2016-06-03 MED ORDER — PHENOL 1.4 % MT LIQD
1.0000 | OROMUCOSAL | Status: DC | PRN
  Filled 2016-06-03: qty 177

## 2016-06-03 MED ORDER — METHOCARBAMOL 500 MG PO TABS: 500.0000 mg | ORAL_TABLET | Freq: Three times a day (TID) | ORAL | 0 refills | Status: DC | PRN

## 2016-06-03 MED ORDER — MECLIZINE HCL 25 MG PO TABS: 25.0000 mg | ORAL_TABLET | Freq: Three times a day (TID) | ORAL | 0 refills | Status: DC | PRN

## 2016-06-03 MED ORDER — ENOXAPARIN SODIUM 100 MG/ML ~~LOC~~ SOLN: 100.0000 mg | Freq: Two times a day (BID) | SUBCUTANEOUS | 0 refills | Status: DC

## 2016-06-03 MED ORDER — ENOXAPARIN (LOVENOX) PATIENT EDUCATION KIT
PACK | Freq: Once | Status: DC
  Filled 2016-06-03: qty 1

## 2016-06-03 NOTE — Discharge Instructions (Signed)
Dizziness Dizziness is a common problem. It is a feeling of unsteadiness or light-headedness. You may feel like you are about to faint. Dizziness can lead to injury if you stumble or fall. Anyone can become dizzy, but dizziness is more common in older adults. This condition can be caused by a number of things, including medicines, dehydration, or illness. Follow these instructions at home: Taking these steps may help with your condition: Eating and drinking   Drink enough fluid to keep your urine clear or pale yellow. This helps to keep you from becoming dehydrated. Try to drink more clear fluids, such as water.  Do not drink alcohol.  Limit your caffeine intake if directed by your health care provider.  Limit your salt intake if directed by your health care provider. Activity   Avoid making quick movements.  Rise slowly from chairs and steady yourself until you feel okay.  In the morning, first sit up on the side of the bed. When you feel okay, stand slowly while you hold onto something until you know that your balance is fine.  Move your legs often if you need to stand in one place for a long time. Tighten and relax your muscles in your legs while you are standing.  Do not drive or operate heavy machinery if you feel dizzy.  Avoid bending down if you feel dizzy. Place items in your home so that they are easy for you to reach without leaning over. Lifestyle   Do not use any tobacco products, including cigarettes, chewing tobacco, or electronic cigarettes. If you need help quitting, ask your health care provider.  Try to reduce your stress level, such as with yoga or meditation. Talk with your health care provider if you need help. General instructions   Watch your dizziness for any changes.  Take medicines only as directed by your health care provider. Talk with your health care provider if you think that your dizziness is caused by a medicine that you are taking.  Tell a friend  or a family member that you are feeling dizzy. If he or she notices any changes in your behavior, have this person call your health care provider.  Keep all follow-up visits as directed by your health care provider. This is important. Contact a health care provider if:  Your dizziness does not go away.  Your dizziness or light-headedness gets worse.  You feel nauseous.  You have reduced hearing.  You have new symptoms.  You are unsteady on your feet or you feel like the room is spinning. Get help right away if:  You vomit or have diarrhea and are unable to eat or drink anything.  You have problems talking, walking, swallowing, or using your arms, hands, or legs.  You feel generally weak.  You are not thinking clearly or you have trouble forming sentences. It may take a friend or family member to notice this.  You have chest pain, abdominal pain, shortness of breath, or sweating.  Your vision changes.  You notice any bleeding.  You have a headache.  You have neck pain or a stiff neck.  You have a fever. This information is not intended to replace advice given to you by your health care provider. Make sure you discuss any questions you have with your health care provider. Document Released: 08/11/2000 Document Revised: 07/24/2015 Document Reviewed: 02/11/2014 Elsevier Interactive Patient Education  2017 [employer]  

## 2016-06-03 NOTE — Progress Notes (Signed)
[name] to be D/C'd Home per MD order.  Discussed with the patient and all questions fully answered.  VSS, Skin clean, dry and intact without evidence of skin break down, no evidence of skin tears noted. IV catheter discontinued intact. Site without signs and symptoms of complications. Dressing and pressure applied.  An After Visit Summary was printed and given to the patient. Patient received prescription.  D/c education completed with patient/family including follow up instructions, medication list, d/c activities limitations if indicated, with other d/c instructions as indicated by MD - patient able to verbalize understanding, all questions fully answered.   Patient instructed to return to ED, call 911, or call MD for any changes in condition.   Patient escorted via [place], and D/C home via private auto.  Dorris Carnes 06/03/2016 3:07 PM

## 2016-06-03 NOTE — Care Management Obs Status (Signed)
[facility] NOTIFICATION   Patient Details  Name: [name] MRN: [id number] Date of Birth: [birth year]   Medicare Observation Status Notification Given:  Yes    Sharin Mons, RN 06/03/2016, 2:00 PM

## 2016-06-03 NOTE — Care Management Note (Signed)
Case Management Note  Patient Details  Name: [name] MRN: [id number] Date of Birth: [birth year]  Subjective/Objective:     Present with CAP. From home with wife.              PCP: Billey Chang  Action/Plan: Plan is to d/c to home today with home health services ( RN,PT).  Expected Discharge Date:                  Expected Discharge Plan:  [place]  In-House Referral:     Discharge planning Services  CM Consult  Post Acute Care Choice:    Choice offered to:  Patient  DME Arranged:  Walker rolling DME Agency:  [facility]. (referral made with [facility] [address] @ [phone])  HH Arranged:  RN, PT (referral made with Butch Penny ) [place]:  [facility] [employer]/ CM made Butch Penny aware pt will need RN to check INR on Saturday,06/05/2016.   Status of Service:  Completed, signed off  If discussed at [place] of Stay Meetings, dates discussed:    Additional Comments:  Sharin Mons, RN 06/03/2016, 2:03 PM

## 2016-06-03 NOTE — Progress Notes (Signed)
qPhysical Therapy Treatment Patient Details Name: [name] MRN: [id number] DOB: Nov 23, 1928 Today's Date: 06/03/2016    History of Present Illness [name] is a 83 y.o. male with several day history of dizziness.  Recent MRI showed no evidence of stroke. On warfarin for his afib. Other medical problems include diabetes, CAD, hypertension, hyperlipidemia, aortic stenosis.   Pt was positive early on for orthstatic hypotension and has CAP.  PMH:  significant for attention, hyperlipidemia, diabetes, CAD status post stent placement, A. fib on Coumadin, vertigo     PT Comments    Patient progressing with mobility with walker.  Discussed issues that can cause dizziness that are different from previous vertigo symptoms and pt appropriately described this sensation as different and sought medical help as a result.  Do not feel vestibular pathology contributing to this episode.  Agree with HHPT for safety with walker in the home.   Follow Up Recommendations  Home health PT;Supervision/Assistance - 24 hour     Equipment Recommendations       Recommendations for Other Services       Precautions / Restrictions Precautions Precautions: Fall Restrictions Weight Bearing Restrictions: No    Mobility  Bed Mobility Overal bed mobility: Needs Assistance Bed Mobility: Sit to Sidelying;Sidelying to Sit   Sidelying to sit: Min assist     Sit to sidelying: Mod assist General bed mobility comments: assist for legs onto bed for positional testing  Transfers Overall transfer level: Needs assistance Equipment used: Rolling walker (2 wheeled) Transfers: Sit to/from Stand Sit to Stand: Supervision         General transfer comment: cues for safety  Ambulation/Gait Ambulation/Gait assistance: Min guard Ambulation Distance (Feet): 250 Feet Assistive device: Rolling walker (2 wheeled) Gait Pattern/deviations: Step-through pattern;Trunk flexed;Shuffle     General Gait Details: no  c/o dizziness, noted some impulsivity with increased speed with RW and increased proximity to walker, also cues and A to keep walker with him as backing up to chair   Stairs            Wheelchair Mobility    Modified Rankin (Stroke Patients Only)       Balance Overall balance assessment: Needs assistance Sitting-balance support: Feet supported Sitting balance-Leahy Scale: Good     Standing balance support: No upper extremity supported Standing balance-Leahy Scale: Fair Standing balance comment: able to stand and balance without UE support, but prefers to have support                            Cognition Arousal/Alertness: Awake/alert Behavior During Therapy: WFL for tasks assessed/performed Overall Cognitive Status: Within Functional Limits for tasks assessed                                        Exercises      General Comments     Vestibular Assessment - 06/03/16 0001      Vestibular Assessment   General Observation Patient reports history of positional vertigo with treatment at outpatient PT, but this feeling different with some light headedness as well as feeling like "going out".  Reports feels due to new medication MD prescribed.     Symptom Behavior   Type of Dizziness Lightheadedness   Frequency of Dizziness intermittent   Duration of Dizziness hours   Aggravating Factors Activity in general   Relieving Factors Head  stationary     Occulomotor Exam   Occulomotor Alignment Normal   Spontaneous Absent   Gaze-induced Absent   Smooth Pursuits Intact   Saccades Intact     Vestibulo-Occular Reflex   VOR 1 Head Only (x 1 viewing) No difficulty with horizontal and vertical head movements for 30 sec each   VOR to Slow Head Movement Positive right     Positional Testing   Sidelying Test Sidelying Right;Sidelying Left   Horizontal Canal Testing --     Sidelying Right   Sidelying Right Duration 30 s   Sidelying Right Symptoms  No nystagmus     Sidelying Left   Sidelying Left Duration 30 s   Sidelying Left Symptoms No nystagmus         Pertinent Vitals/Pain Pain Assessment: No/denies pain Faces Pain Scale: Hurts little more Pain Location: left neck Pain Descriptors / Indicators: Aching;Grimacing;Guarding Pain Intervention(s): Monitored during session    Home Living Family/patient expects to be discharged to:: Private residence Living Arrangements: Spouse/significant other Available Help at Discharge: Family;Available 24 hours/day Type of Home: House Home Access: Stairs to enter Entrance Stairs-Rails: None Home Layout: One level Home Equipment: Bedside commode;Shower seat - built in;Walker - standard      Prior Function Level of Independence: Needs assistance  Gait / Transfers Assistance Needed: used RW last few days but prior to that no device       PT Goals (current goals can now be found in the care plan section) Acute Rehab PT Goals Patient Stated Goal: to go home Progress towards PT goals: Progressing toward goals    Frequency    Min 3X/week      PT Plan Current plan remains appropriate    Co-evaluation             End of Session Equipment Utilized During Treatment: Gait belt Activity Tolerance: Patient tolerated treatment well Patient left: in chair;with call bell/phone within reach;with family/visitor present   PT Visit Diagnosis: Unsteadiness on feet (R26.81);Muscle weakness (generalized) (M62.81)     Time: 1975-8832 PT Time Calculation (min) (ACUTE ONLY): 26 min  Charges:  $Gait Training: 8-22 mins $Neuromuscular Re-education: 8-22 mins                    G CodesMagda Kiel, [place] [phone] 06/03/2016    [name] 06/03/2016, 3:09 PM

## 2016-06-03 NOTE — Discharge Summary (Signed)
Physician Discharge Summary  [name] FAO:[id number] DOB: January 04, 1929 DOA: 06/02/2016  PCP: [name], MD  Admit date: 06/02/2016 Discharge date: 06/03/2016  Time spent: 45 minutes  Recommendations for Outpatient Follow-up:  Patient will be discharged to home with home health physical and occupational therapy and RN to check INR on 06/05/2016.  Patient will need to follow up with primary care provider within one week of discharge.  Patient should continue medications as prescribed.  Patient should follow a heart healthy/carb modified diet. Will need outpatient carotid doppler in 6 months, may follow up with Dr. [name], vascular surgery.   Discharge Diagnoses:  Community-acquired pneumonia Dehydration/orthostatic hypotension Dizziness/vertigo Acute muscle stiffness of the neck Atrial fibrillation Diabetes mellitus, type II Hypertension Coronary artery disease BPH Ataxia Right carotid stenosis  Discharge Condition: Stable  Diet recommendation: heart healthy  Filed Weights   06/02/16 0226 06/03/16 0500  Weight: 102.1 kg (225 lb) 95.8 kg (211 lb 1.6 oz)    History of present illness:  On 06/02/2016 by Ms. Dyanne Carrel, NP [name] is a very pleasant 83 y.o. male with medical history significant for attention, hyperlipidemia, diabetes, CAD status post stent placement, A. fib on Coumadin, vertigo presents to the emergency Department chief complaint of lightheadedness/dizziness and neck pain. Initial evaluation reveals orthostatic hypotension and imaging concerning for early pneumonia  Information is obtained from the patient. He states over the last 3 days he has had worsening dizziness/lightheadedness and left-sided neck pain. He does have a history of vertigo but he says this is different in that it is constant and his vertigo usually comes and goes. He does report worsening with head movement. Was seen yesterday and had an MRI which he reports was unremarkable. He was  diagnosed with new ataxia for which he was given valium. He was told to report to the emergency department if his symptoms do not improve after 48 hours. When he awakened during the night last night he was so weak he couldn't get out of bed so he decided to come to the hospital. He denies headache visual disturbances numbness tingling of extremities. He denies difficulty chewing or swallowing. He denies chest pain palpitation shortness of breath fever chills cough diarrhea constipation. He denies dysuria hematuria frequency or urgency. He denies any recent falls or injuries. Denies lower extremity edema or orthopnea. He does report a gradual unintentional weight loss over the last 45 months of approximately 20 pounds.  Hospital Course:  Community-acquired pneumonia -CT neck reveals right upper lobe patchy consolidation, leukocytosis, max temp 99. Oxygen saturation level greater than 90% on room air.  -Started on doxycycline in the ER -Strep pneumonia urine antigen negative -Blood cultures show no growth to date -patient afebrile, no leukocytosis, VSS -Influenza negative  Dehydration/orthostatic hypotension -Likely related to decreased oral intake in the setting of above.  -Given IVF and BP stable  Dizziness/ Vertigo -Likely related to above.  -Does have a history of vertigo but indicates that this is different in that vertigo typically comes and goes this is been more continuous.  -CT of the head and neck as noted above.  -Was orthostatic.  -MRI without acute abnormality.  -Neuro exam benign -Continue meclizine -Per patient, dizziness has improved -PT/OT consulted, recommended HH  Acute muscle stiffness of neck -No recent injury. -small knot, nontender to palpation  -Admitting provider discussed with radiologist who opine likely benign lipoma vs cyst.  Imaging as noted above.  -Continue pain control and warm compresses -PT rec HH -Follow up with  PCP -Encouraged range of motion  exercises  Atrial fibrillation , chronic -CHADSVASC 5 (based on age, CAD, DM, HTN) -INR 1.8 today (was 1.55) -Continue cardizem and metoprolol -Recheck INR on Saturday, 06/05/2016- IF INR >2, stop lovenox and continue with coumadin -Currently bridging with lovenox  Diabetes mellitus, type II -Continue home medications up discharge  Hypertension  -Continue metoprolol, cardizem -Follow up with PCP  -lisinopril held as patient was orthostatic  CAD  -S/p CABG, DES of RCA 2016.  -Curently no chest pain -Continue statin, coumadin, metoprolol  BPH -Continue home meds  Ataxia -Newly diagnosed per patient. Recent MRI without acute infarct.  -He was prescribed valium per PCP recently  -continue PT  Right Carotid stenosis -Per CTA 60% -Vascular surgery consulted and appreciated, recommended carotid in 6 months  Procedures: None  Consultations: Vascular surgery, Dr. [name]  Discharge Exam: Vitals:   06/03/16 0547 06/03/16 0923  BP: 127/64 (!) 149/70  Pulse: 91 72  Resp: 18   Temp: 97.5 F (36.4 C)    Patient states he's feeling better today. Denies any further dizziness. Denies chest pain, shortness of breath, abdominal pain, nausea or vomiting.   General: Well developed, well nourished, NAD, appears stated age  65: NCAT,  mucous membranes moist.  Neck: Supple, no JVD, no masses  Cardiovascular: S1 S2 auscultated, irregular  Respiratory: Clear to auscultation bilaterally with equal chest rise  Abdomen: Soft, nontender, nondistended, + bowel sounds  Extremities: warm dry without cyanosis clubbing or edema  Neuro: AAOx3, nonfocal  Psych: Normal affect and demeanor with intact judgement and insight, pleasant  Discharge Instructions Discharge Instructions    Discharge instructions    Complete by:  As directed    Patient will be discharged to home with home health physical therapy and RN to check INR on 06/05/2016.  Patient will need to follow up with  primary care provider within one week of discharge.  Patient should continue medications as prescribed.  Patient should follow a heart healthy/carb modified diet.   Take Coumadin 7.5mg  tonight 06/03/2016. Resume scheduled coumadin dose 06/04/2016. INR to be checked on Saturday, if INR >2, discontiue lovenox. If INR is not greater than 2, continue lovenox until 06/07/2016, and have INR rechecked.     Current Discharge Medication List    START taking these medications   Details  doxycycline (VIBRA-TABS) 100 MG tablet Take 1 tablet (100 mg total) by mouth every 12 (twelve) hours. Qty: 10 tablet, Refills: 0    enoxaparin (LOVENOX) 100 MG/ML injection Inject 1 mL (100 mg total) into the skin every 12 (twelve) hours. Qty: 6 Syringe, Refills: 0    meclizine (ANTIVERT) 25 MG tablet Take 1 tablet (25 mg total) by mouth 3 (three) times daily as needed for dizziness. Qty: 30 tablet, Refills: 0    methocarbamol (ROBAXIN) 500 MG tablet Take 1 tablet (500 mg total) by mouth every 8 (eight) hours as needed for muscle spasms. Qty: 30 tablet, Refills: 0      CONTINUE these medications which have NOT CHANGED   Details  AVODART 0.5 MG capsule Take 0.5 mg by mouth every other day.     diltiazem (CARDIZEM CD) 360 MG 24 hr capsule Take 1 capsule (360 mg total) by mouth daily. Qty: 30 capsule, Refills: 6    empagliflozin (JARDIANCE) 10 MG TABS tablet Take 10 mg by mouth daily.    insulin glargine (LANTUS) 100 UNIT/ML injection Inject 34 Units into the skin daily.     metFORMIN (GLUCOPHAGE) 1000  MG tablet Take 1 tablet by mouth 2 (two) times daily.    metoprolol succinate (TOPROL-XL) 100 MG 24 hr tablet Take 1 tablet (100 mg total) by mouth 2 (two) times daily. Qty: 180 tablet, Refills: 3    nitroGLYCERIN (NITROSTAT) 0.4 MG SL tablet Place 0.4 mg under the tongue every 5 (five) minutes as needed for chest pain (x 3 doses).    pantoprazole (PROTONIX) 40 MG tablet Take 1 tablet by mouth daily at [facility]:  90 tablet, Refills: 3    simvastatin (ZOCOR) 80 MG tablet Take 40 mg by mouth at bedtime.     Tamsulosin HCl (FLOMAX) 0.4 MG CAPS Take 0.4 mg by mouth every other day.     warfarin (COUMADIN) 5 MG tablet Take 1 to 1.5 tablets by mouth daily as directed by coumadin clinic Qty: 120 tablet, Refills: 1      STOP taking these medications     lisinopril (PRINIVIL,ZESTRIL) 5 MG tablet        Allergies  Allergen Reactions  . Erythromycin Base   . Amoxicillin Other (See Comments)    Unknown  . Cefadroxil Other (See Comments)    Unknown  . Ciprofloxacin Other (See Comments)    Unknown  . Erythromycin Other (See Comments)    Upset stomach   Follow-up Information    ANDY,CAMILLE L, MD. Schedule an appointment as soon as possible for a visit in 1 week(s).   Specialty:  Family Medicine Why:  Hospital Follow up Contact information: [place] [address] [phone]        [facility] Follow up.   Why:  rolling walker to be delivered to bedside prior to discharge Contact information: [address] [place] [address] [phone]        [facility] Follow up.   Why:  home health services (RN,PT) arranged, office will call and set up home visits  Contact information: [address] [place] [address] [phone]        Ruta Hinds, MD. Schedule an appointment as soon as possible for a visit in 6 month(s).   Specialties:  Vascular Surgery, Cardiology Why:  Follow up in 6 months, carotid doppler Contact information: [place]  [phone]            The results of significant diagnostics from this hospitalization (including imaging, microbiology, ancillary and laboratory) are listed below for reference.    Significant Diagnostic Studies: Ct Angio Head W Or Wo Contrast  Result Date: 06/02/2016 CLINICAL DATA:  83 y/o  M; dizziness and neck pain.  EXAM: CT ANGIOGRAPHY HEAD AND NECK TECHNIQUE: Multidetector CT imaging of the head and neck was performed using the standard protocol during bolus administration of intravenous contrast. Multiplanar CT image reconstructions and MIPs were obtained to evaluate the vascular anatomy. Carotid stenosis measurements (when applicable) are obtained utilizing NASCET criteria, using the distal internal carotid diameter as the denominator. CONTRAST:  50 cc Isovue 370 COMPARISON:  None. FINDINGS: CT HEAD FINDINGS Brain: No evidence of acute infarction, hemorrhage, hydrocephalus, extra-axial collection or mass lesion/mass effect. Mild chronic microvascular ischemic changes and parenchymal volume loss of the brain. Vascular: As below. Skull: Normal. Negative for fracture or focal lesion. Sinuses: Imaged portions are clear. Orbits: Bilateral intra-ocular lens replacement. Review of the MIP images confirms the above findings CTA NECK FINDINGS Aortic arch: 4 vessel arch. Moderate calcific atherosclerosis. No significant stenosis of great  vessel origins. Right carotid system: No evidence of dissection or occlusion. Dense calcific atherosclerosis of the carotid bifurcation and proximal ICA with moderate 60% stenosis of proximal ICA. Hairpin turn of upper cervical segment of ICA. Left carotid system: No evidence of dissection, stenosis (50% or greater) or occlusion. Dense calcific atherosclerosis of the carotid bifurcation without high-grade stenosis. Hairpin turn of upper cervical segment of ICA. Vertebral arteries: Codominant. No evidence of dissection, stenosis (50% or greater) or occlusion. Skeleton: Advanced cervical spondylosis with interbody fusion of the C5 through C7 vertebral bodies, ossification of posterior longitudinal ligament, and mild to moderate bony canal stenosis through those levels. No acute osseous abnormality is evident. Partially visualize median sternotomy postsurgical changes. Other neck: Negative. Upper chest:  Right upper lobe patchy consolidation likely represents pneumonia. Review of the MIP images confirms the above findings CTA HEAD FINDINGS Anterior circulation: No significant stenosis, proximal occlusion, aneurysm, or vascular malformation. Extensive calcific atherosclerosis of cavernous and paraclinoid internal carotid arteries without significant stenosis. Posterior circulation: Right V4 segment calcified plaque with mild stenosis. Small caliber basilar artery is likely due to variant anatomy of bilateral fetal PCA. Venous sinuses: As permitted by contrast timing, patent. Anatomic variants: Bilateral fetal posterior cerebral arteries and small anterior communicating artery. Delayed phase: No abnormal intracranial enhancement. Review of the MIP images confirms the above findings IMPRESSION: 1. No acute intracranial abnormality identified on noncontrast CT of head. After administration of intravenous contrast there is no abnormal enhancement. 2. Mild chronic microvascular ischemic changes and mild parenchymal volume loss of the brain. 3. Patent carotid and vertebral arteries of the neck without evidence for dissection, aneurysm, or occlusion. 4. Right proximal ICA moderate 60% stenosis. 5. Patent circle of Willis without evidence for proximal occlusion, high-grade stenosis, aneurysm, or vascular malformation. 6. Right upper lobe patchy consolidation likely represents pneumonia. These results were called by telephone at the time of interpretation on 06/02/2016 at 5:16 am to Dr. Thayer Jew , who verbally acknowledged these results. Electronically Signed   By: Kristine Garbe M.D.   On: 06/02/2016 05:18   Ct Angio Neck W And/or Wo Contrast  Result Date: 06/02/2016 CLINICAL DATA:  83 y/o  M; dizziness and neck pain. EXAM: CT ANGIOGRAPHY HEAD AND NECK TECHNIQUE: Multidetector CT imaging of the head and neck was performed using the standard protocol during bolus administration of intravenous contrast.  Multiplanar CT image reconstructions and MIPs were obtained to evaluate the vascular anatomy. Carotid stenosis measurements (when applicable) are obtained utilizing NASCET criteria, using the distal internal carotid diameter as the denominator. CONTRAST:  50 cc Isovue 370 COMPARISON:  None. FINDINGS: CT HEAD FINDINGS Brain: No evidence of acute infarction, hemorrhage, hydrocephalus, extra-axial collection or mass lesion/mass effect. Mild chronic microvascular ischemic changes and parenchymal volume loss of the brain. Vascular: As below. Skull: Normal. Negative for fracture or focal lesion. Sinuses: Imaged portions are clear. Orbits: Bilateral intra-ocular lens replacement. Review of the MIP images confirms the above findings CTA NECK FINDINGS Aortic arch: 4 vessel arch. Moderate calcific atherosclerosis. No significant stenosis of great vessel origins. Right carotid system: No evidence of dissection or occlusion. Dense calcific atherosclerosis of the carotid bifurcation and proximal ICA with moderate 60% stenosis of proximal ICA. Hairpin turn of upper cervical segment of ICA. Left carotid system: No evidence of dissection, stenosis (50% or greater) or occlusion. Dense calcific atherosclerosis of the carotid bifurcation without high-grade stenosis. Hairpin turn of upper cervical segment of ICA. Vertebral arteries: Codominant. No evidence of dissection, stenosis (50% or  greater) or occlusion. Skeleton: Advanced cervical spondylosis with interbody fusion of the C5 through C7 vertebral bodies, ossification of posterior longitudinal ligament, and mild to moderate bony canal stenosis through those levels. No acute osseous abnormality is evident. Partially visualize median sternotomy postsurgical changes. Other neck: Negative. Upper chest: Right upper lobe patchy consolidation likely represents pneumonia. Review of the MIP images confirms the above findings CTA HEAD FINDINGS Anterior circulation: No significant stenosis,  proximal occlusion, aneurysm, or vascular malformation. Extensive calcific atherosclerosis of cavernous and paraclinoid internal carotid arteries without significant stenosis. Posterior circulation: Right V4 segment calcified plaque with mild stenosis. Small caliber basilar artery is likely due to variant anatomy of bilateral fetal PCA. Venous sinuses: As permitted by contrast timing, patent. Anatomic variants: Bilateral fetal posterior cerebral arteries and small anterior communicating artery. Delayed phase: No abnormal intracranial enhancement. Review of the MIP images confirms the above findings IMPRESSION: 1. No acute intracranial abnormality identified on noncontrast CT of head. After administration of intravenous contrast there is no abnormal enhancement. 2. Mild chronic microvascular ischemic changes and mild parenchymal volume loss of the brain. 3. Patent carotid and vertebral arteries of the neck without evidence for dissection, aneurysm, or occlusion. 4. Right proximal ICA moderate 60% stenosis. 5. Patent circle of Willis without evidence for proximal occlusion, high-grade stenosis, aneurysm, or vascular malformation. 6. Right upper lobe patchy consolidation likely represents pneumonia. These results were called by telephone at the time of interpretation on 06/02/2016 at 5:16 am to Dr. Thayer Jew , who verbally acknowledged these results. Electronically Signed   By: Kristine Garbe M.D.   On: 06/02/2016 05:18    Microbiology: Recent Results (from the past 240 hour(s))  Blood culture (routine x 2)     Status: None (Preliminary result)   Collection Time: 06/02/16  6:05 AM  Result Value Ref Range Status   Specimen Description BLOOD RIGHT ARM  Final   Special Requests   Final    BOTTLES DRAWN AEROBIC AND ANAEROBIC Blood Culture adequate volume   Culture NO GROWTH 1 DAY  Final   Report Status PENDING  Incomplete  Blood culture (routine x 2)     Status: None (Preliminary result)    Collection Time: 06/02/16  6:15 AM  Result Value Ref Range Status   Specimen Description BLOOD LEFT HAND  Final   Special Requests   Final    BOTTLES DRAWN AEROBIC AND ANAEROBIC Blood Culture adequate volume   Culture NO GROWTH 1 DAY  Final   Report Status PENDING  Incomplete     Labs: Basic Metabolic Panel:  Recent Labs Lab 06/02/16 0252 06/03/16 0545  NA 138 138  K 4.0 4.1  CL 103 106  CO2 25 23  GLUCOSE 165* 201*  BUN 17 21*  CREATININE 1.10 1.22  CALCIUM 9.3 9.0   Liver Function Tests: No results for input(s): AST, ALT, ALKPHOS, BILITOT, PROT, ALBUMIN in the last 168 hours. No results for input(s): LIPASE, AMYLASE in the last 168 hours. No results for input(s): AMMONIA in the last 168 hours. CBC:  Recent Labs Lab 06/02/16 0252 06/03/16 0545  WBC 16.6* 11.9*  NEUTROABS 13.9*  --   HGB 12.9* 12.3*  HCT 39.7 38.1*  MCV 83.4 84.1  PLT 166 189   Cardiac Enzymes: No results for input(s): CKTOTAL, CKMB, CKMBINDEX, TROPONINI in the last 168 hours. BNP: BNP (last 3 results) No results for input(s): BNP in the last 8760 hours.  ProBNP (last 3 results) No results for input(s): PROBNP in  the last 8760 hours.  CBG:  Recent Labs Lab 06/02/16 1719 06/02/16 2214 06/03/16 0806 06/03/16 1201  GLUCAP 185* 298* 166* 216*       Signed:  Cristal Ford  [facility] 06/03/2016, 2:40 PM

## 2016-06-03 NOTE — Progress Notes (Signed)
ANTICOAGULATION CONSULT NOTE - [place] for warfarin and lovenox Indication: atrial fibrillation  Allergies  Allergen Reactions  . Erythromycin Base   . Amoxicillin Other (See Comments)    Unknown  . Cefadroxil Other (See Comments)    Unknown  . Ciprofloxacin Other (See Comments)    Unknown  . Erythromycin Other (See Comments)    Upset stomach   Patient Measurements: Height: 5\' 7"  (170.2 cm) Weight: 211 lb 1.6 oz (95.8 kg) IBW/kg (Calculated) : 66.1  Vital Signs: Temp: 97.5 F (36.4 C) (04/05 0547) Temp Source: Oral (04/05 0547) BP: 149/70 (04/05 0923) Pulse Rate: 72 (04/05 0923)  Labs:  Recent Labs  06/02/16 0252 06/02/16 0850 06/03/16 0545 06/03/16 0846  HGB 12.9*  --  12.3*  --   HCT 39.7  --  38.1*  --   PLT 166  --  189  --   LABPROT 19.0* 18.7*  --  21.1*  INR 1.57 1.55  --  1.80  CREATININE 1.10  --  1.22  --     Estimated Creatinine Clearance: 48 mL/min (by C-G formula based on SCr of 1.22 mg/dL).   Medical History: Past Medical History:  Diagnosis Date  . Abdominal pain   . Acute renal failure ([place])     resolved  . Arthritis    "hands & legs" (11/'10/2014)  . Ataxia   . Atrial fibrillation ([place])   . Bladder outlet obstruction   . Bladder outlet obstruction   . BPH (benign prostatic hyperplasia)   . CAD (coronary artery disease)    a. CABG IN 1989. b. 01/08/2015 CTO of ost LAD, LIMA to LAD not visualized but assumed patent given myoview finding, occluded SVG to diagonal, 99% mid RCA tx w/ SYNERGY DES 3X28 mm  . Constipation   . Diabetes mellitus type 2, insulin dependent ([place])   . Epistaxis, recurrent Feb 2017  . GERD (gastroesophageal reflux disease)   . HTN (hypertension)   . Hyperlipemia   . Kidney stones   . Mild aortic stenosis   . Rib fractures    left rib fractures being treated with pain medications  . Stented coronary artery Nov 2016   RCA DES  . Urinary tract infection     Enterococcus    Assessment: [name] is a 83 y.o. male with PMH atrial fibrillation on warfarin PTA last dose 4/3. The patient is being held for observation and pharmacy has been asked to resume warfarin. INR subtherapeutic 1.57  on admission. HgB low 12.9 but stable. The provider would like to bridge with lovenox until INR therapeutic.   INR trending up to 1.8 after increased dose.  CBC stable.  No bleeding noted.  PTA warfarin: 5mg  daily except 7.5mg  on Sunday and Wednesday  Goal of Therapy:  INR 2-3 Monitor platelets by anticoagulation protocol: Yes   Plan:  Warfarin 7.5 mg tonight x1 Lovenox 100mg  q12 hours Daily INR/CBC Monitor for s/sx of bleeding  [employer], Pharm.D., BCPS Clinical Pharmacist Pager [phone] 06/03/2016 11:33 AM

## 2016-06-03 NOTE — Evaluation (Signed)
Occupational Therapy Evaluation Patient Details Name: [name] MRN: [id number] DOB: 09-01-1928 Today's Date: 06/03/2016    History of Present Illness [name] is a 83 y.o. male with several day history of dizziness.  Recent MRI showed no evidence of stroke.  Seen by his primary care physician and referred to Neurology.  He has a history of vertigo but states these symptoms are different.  His vertigo usually gives him a spinning room sensation.  He states this feels more like a "cloud over his head".  He has a history of afib but has not really noted any palpitations recently.  His symptoms are worse if he is lying flat in bed or when he goes to sit or stand up.  He reports no sick contacts.  He denies anorexia or decreased intake of fluids.  He has no prior history of stroke.  He denies TIA or amaurosis.  He is on warfarin for his afib. Other medical problems include diabetes, CAD, hypertension, hyperlipidemia, aortic stenosis.   Pt was positive early on for orthstatic hypotension and has CAP.  PMH:  significant for attention, hyperlipidemia, diabetes, CAD status post stent placement, A. fib on Coumadin, vertigo    Clinical Impression   Patient presenting with decreased I in self care, balance, functional transfers/mobility, and safety awareness. Patient reports being I PTA. Patient currently functioning at supervision for UB self care and min guard for LB self care. Close supervision for ambulation and functional transfers with use of RW.  Patient will benefit from acute OT to increase overall independence in the areas of ADLs, functional mobility, and safety in order to safely discharge home with wife.    Follow Up Recommendations  Home health OT;Supervision/Assistance - 24 hour    Equipment Recommendations  Other (comment) (safety treads)    Recommendations for Other Services       Precautions / Restrictions Precautions Precautions: Fall Restrictions Weight Bearing  Restrictions: No      Mobility Bed Mobility               General bed mobility comments: found seated in recliner chair   Transfers Overall transfer level: Needs assistance Equipment used: Rolling walker (2 wheeled) Transfers: Sit to/from Stand Sit to Stand: Supervision         General transfer comment: min cues for hand placement    Balance Overall balance assessment: Needs assistance;History of Falls Sitting-balance support: No upper extremity supported;Feet supported Sitting balance-Leahy Scale: Fair     Standing balance support: Bilateral upper extremity supported;During functional activity Standing balance-Leahy Scale: Poor Standing balance comment: relies on RW for balance.         ADL either performed or assessed with clinical judgement   ADL Overall ADL's : Needs assistance/impaired     Grooming: Wash/dry hands;Standing;Supervision/safety   Upper Body Bathing: Sitting   Lower Body Bathing: Min guard;Sit to/from stand   Upper Body Dressing : Supervision/safety   Lower Body Dressing: Min guard   Toilet Transfer: Supervision/safety;RW;Comfort height toilet   Toileting- Clothing Manipulation and Hygiene: Supervision/safety;Sit to/from stand         General ADL Comments: overall supervision for UB self care tasks and min guard for LB self care this session. Wife present in room. Educated pt and caregiver on shower transfer with use of RW and recommended purchase of safety treads to decrease fall risk.     Vision Baseline Vision/History: Wears glasses Wears Glasses: At all times Patient Visual Report: No change from baseline  Perception     Praxis      Pertinent Vitals/Pain Pain Assessment: No/denies pain     Hand Dominance     Extremity/Trunk Assessment Upper Extremity Assessment Upper Extremity Assessment: Generalized weakness   Lower Extremity Assessment Lower Extremity Assessment: Defer to PT evaluation        Communication     Cognition Arousal/Alertness: Awake/alert Behavior During Therapy: WFL for tasks assessed/performed Overall Cognitive Status: Within Functional Limits for tasks assessed                     Home Living Family/patient expects to be discharged to:: Private residence Living Arrangements: Spouse/significant other Available Help at Discharge: Family;Available 24 hours/day Type of Home: House Home Access: Stairs to enter [employer] of Steps: 1 Entrance Stairs-Rails: None Home Layout: One level     Bathroom Shower/Tub: Occupational psychologist: Handicapped height     Home Equipment: Bedside commode;Shower seat - built in;Walker - standard          Prior Functioning/Environment Level of Independence: Needs assistance  Gait / Transfers Assistance Needed: used RW last few days but prior to that no device              OT Problem List: Decreased strength;Decreased activity tolerance;Impaired balance (sitting and/or standing);Decreased safety awareness;Decreased knowledge of use of DME or AE      OT Treatment/Interventions: Self-care/ADL training;Therapeutic exercise;Energy conservation;DME and/or AE instruction;Neuromuscular education;Balance training;Therapeutic activities    OT Goals(Current goals can be found in the care plan section) Acute Rehab OT Goals Patient Stated Goal: to go home OT Goal Formulation: With patient/family Time For Goal Achievement: 06/17/16 Potential to Achieve Goals: Good ADL Goals Pt Will Perform Grooming: (P) with modified independence Pt Will Perform Upper Body Bathing: (P) with modified independence Pt Will Perform Lower Body Bathing: (P) with supervision Pt Will Perform Upper Body Dressing: (P) with modified independence Pt Will Perform Lower Body Dressing: (P) with supervision Pt Will Transfer to Toilet: (P) with modified independence Pt Will Perform Toileting - Clothing Manipulation and hygiene:  (P) with modified independence  OT Frequency: Min 2X/week   Barriers to D/C:    none noted          End of Session Equipment Utilized During Treatment: Rolling walker  Activity Tolerance: Patient tolerated treatment well Patient left: in chair;with call bell/phone within reach;with family/visitor present;Other (comment) (Pt entering the room)  OT Visit Diagnosis: Muscle weakness (generalized) (M62.81)                Time: 0240-9735 OT Time Calculation (min): 19 min Charges:  OT General Charges $OT Visit: 1 Procedure OT Evaluation $OT Eval Moderate Complexity: 1 Procedure G-Codes: OT G-codes **NOT FOR INPATIENT CLASS** Functional Assessment Tool Used: AM-PAC 6 Clicks Daily Activity Functional Limitation: Self care Self Care Current Status (H2992): At least 20 percent but less than 40 percent impaired, limited or restricted Self Care Goal Status (E2683): At least 1 percent but less than 20 percent impaired, limited or restricted    Gypsy Decant, MS, OTR/L, CBIS 06/03/2016, 1:58 PM

## 2016-06-05 DIAGNOSIS — R270 Ataxia, unspecified: Secondary | ICD-10-CM | POA: Diagnosis not present

## 2016-06-05 DIAGNOSIS — E785 Hyperlipidemia, unspecified: Secondary | ICD-10-CM | POA: Diagnosis not present

## 2016-06-05 DIAGNOSIS — J189 Pneumonia, unspecified organism: Secondary | ICD-10-CM | POA: Diagnosis not present

## 2016-06-05 DIAGNOSIS — N400 Enlarged prostate without lower urinary tract symptoms: Secondary | ICD-10-CM | POA: Diagnosis not present

## 2016-06-05 DIAGNOSIS — I482 Chronic atrial fibrillation: Secondary | ICD-10-CM | POA: Diagnosis not present

## 2016-06-05 DIAGNOSIS — Z5181 Encounter for therapeutic drug level monitoring: Secondary | ICD-10-CM | POA: Diagnosis not present

## 2016-06-05 DIAGNOSIS — E119 Type 2 diabetes mellitus without complications: Secondary | ICD-10-CM | POA: Diagnosis not present

## 2016-06-05 DIAGNOSIS — Z7901 Long term (current) use of anticoagulants: Secondary | ICD-10-CM | POA: Diagnosis not present

## 2016-06-05 DIAGNOSIS — I2510 Atherosclerotic heart disease of native coronary artery without angina pectoris: Secondary | ICD-10-CM | POA: Diagnosis not present

## 2016-06-05 DIAGNOSIS — Z794 Long term (current) use of insulin: Secondary | ICD-10-CM | POA: Diagnosis not present

## 2016-06-05 DIAGNOSIS — Z955 Presence of coronary angioplasty implant and graft: Secondary | ICD-10-CM | POA: Diagnosis not present

## 2016-06-06 ENCOUNTER — Emergency Department (HOSPITAL_COMMUNITY): Payer: Medicare Other

## 2016-06-06 ENCOUNTER — Encounter (HOSPITAL_COMMUNITY): Payer: Self-pay

## 2016-06-06 ENCOUNTER — Inpatient Hospital Stay (HOSPITAL_COMMUNITY)
Admission: EM | Admit: 2016-06-06 | Discharge: 2016-06-08 | DRG: 066 | Disposition: A | Discharge status: Home / Self Care | Payer: Medicare Other | Attending: Internal Medicine | Admitting: Internal Medicine

## 2016-06-06 DIAGNOSIS — K5900 Constipation, unspecified: Secondary | ICD-10-CM | POA: Diagnosis present

## 2016-06-06 DIAGNOSIS — I4891 Unspecified atrial fibrillation: Secondary | ICD-10-CM | POA: Diagnosis present

## 2016-06-06 DIAGNOSIS — Z7901 Long term (current) use of anticoagulants: Secondary | ICD-10-CM

## 2016-06-06 DIAGNOSIS — I482 Chronic atrial fibrillation: Secondary | ICD-10-CM

## 2016-06-06 DIAGNOSIS — I10 Essential (primary) hypertension: Secondary | ICD-10-CM | POA: Diagnosis present

## 2016-06-06 DIAGNOSIS — Z87442 Personal history of urinary calculi: Secondary | ICD-10-CM

## 2016-06-06 DIAGNOSIS — Z951 Presence of aortocoronary bypass graft: Secondary | ICD-10-CM | POA: Diagnosis not present

## 2016-06-06 DIAGNOSIS — Z794 Long term (current) use of insulin: Secondary | ICD-10-CM | POA: Diagnosis not present

## 2016-06-06 DIAGNOSIS — Z955 Presence of coronary angioplasty implant and graft: Secondary | ICD-10-CM | POA: Diagnosis not present

## 2016-06-06 DIAGNOSIS — R42 Dizziness and giddiness: Secondary | ICD-10-CM | POA: Diagnosis not present

## 2016-06-06 DIAGNOSIS — Z9842 Cataract extraction status, left eye: Secondary | ICD-10-CM | POA: Diagnosis not present

## 2016-06-06 DIAGNOSIS — Z8701 Personal history of pneumonia (recurrent): Secondary | ICD-10-CM

## 2016-06-06 DIAGNOSIS — E1136 Type 2 diabetes mellitus with diabetic cataract: Secondary | ICD-10-CM | POA: Diagnosis present

## 2016-06-06 DIAGNOSIS — Z96641 Presence of right artificial hip joint: Secondary | ICD-10-CM | POA: Diagnosis present

## 2016-06-06 DIAGNOSIS — R270 Ataxia, unspecified: Secondary | ICD-10-CM | POA: Diagnosis present

## 2016-06-06 DIAGNOSIS — Z7984 Long term (current) use of oral hypoglycemic drugs: Secondary | ICD-10-CM

## 2016-06-06 DIAGNOSIS — K219 Gastro-esophageal reflux disease without esophagitis: Secondary | ICD-10-CM | POA: Diagnosis present

## 2016-06-06 DIAGNOSIS — Z79899 Other long term (current) drug therapy: Secondary | ICD-10-CM

## 2016-06-06 DIAGNOSIS — Z808 Family history of malignant neoplasm of other organs or systems: Secondary | ICD-10-CM

## 2016-06-06 DIAGNOSIS — R29700 NIHSS score 0: Secondary | ICD-10-CM | POA: Diagnosis present

## 2016-06-06 DIAGNOSIS — I2510 Atherosclerotic heart disease of native coronary artery without angina pectoris: Secondary | ICD-10-CM | POA: Diagnosis present

## 2016-06-06 DIAGNOSIS — I6521 Occlusion and stenosis of right carotid artery: Secondary | ICD-10-CM | POA: Diagnosis present

## 2016-06-06 DIAGNOSIS — R0602 Shortness of breath: Secondary | ICD-10-CM | POA: Diagnosis not present

## 2016-06-06 DIAGNOSIS — Z888 Allergy status to other drugs, medicaments and biological substances status: Secondary | ICD-10-CM | POA: Diagnosis not present

## 2016-06-06 DIAGNOSIS — S199XXA Unspecified injury of neck, initial encounter: Secondary | ICD-10-CM | POA: Diagnosis not present

## 2016-06-06 DIAGNOSIS — E860 Dehydration: Secondary | ICD-10-CM | POA: Diagnosis present

## 2016-06-06 DIAGNOSIS — I639 Cerebral infarction, unspecified: Secondary | ICD-10-CM | POA: Diagnosis not present

## 2016-06-06 DIAGNOSIS — N400 Enlarged prostate without lower urinary tract symptoms: Secondary | ICD-10-CM | POA: Diagnosis present

## 2016-06-06 DIAGNOSIS — E0800 Diabetes mellitus due to underlying condition with hyperosmolarity without nonketotic hyperglycemic-hyperosmolar coma (NKHHC): Secondary | ICD-10-CM | POA: Diagnosis not present

## 2016-06-06 DIAGNOSIS — E785 Hyperlipidemia, unspecified: Secondary | ICD-10-CM | POA: Diagnosis present

## 2016-06-06 DIAGNOSIS — Z880 Allergy status to penicillin: Secondary | ICD-10-CM | POA: Diagnosis not present

## 2016-06-06 DIAGNOSIS — R404 Transient alteration of awareness: Secondary | ICD-10-CM | POA: Diagnosis not present

## 2016-06-06 DIAGNOSIS — Z961 Presence of intraocular lens: Secondary | ICD-10-CM | POA: Diagnosis present

## 2016-06-06 DIAGNOSIS — I63412 Cerebral infarction due to embolism of left middle cerebral artery: Secondary | ICD-10-CM | POA: Diagnosis not present

## 2016-06-06 DIAGNOSIS — Z9841 Cataract extraction status, right eye: Secondary | ICD-10-CM | POA: Diagnosis not present

## 2016-06-06 DIAGNOSIS — Z87891 Personal history of nicotine dependence: Secondary | ICD-10-CM | POA: Diagnosis not present

## 2016-06-06 DIAGNOSIS — R05 Cough: Secondary | ICD-10-CM | POA: Diagnosis not present

## 2016-06-06 LAB — GLUCOSE, CAPILLARY: Glucose-Capillary: 151 mg/dL — ABNORMAL HIGH (ref 65–99)

## 2016-06-06 LAB — CBC WITH DIFFERENTIAL/PLATELET
Basophils Absolute: 0 10*3/uL (ref 0.0–0.1)
Basophils Relative: 1 %
Eosinophils Absolute: 0.2 10*3/uL (ref 0.0–0.7)
Eosinophils Relative: 3 %
HCT: 38.3 % — ABNORMAL LOW (ref 39.0–52.0)
Hemoglobin: 12.7 g/dL — ABNORMAL LOW (ref 13.0–17.0)
Lymphocytes Relative: 17 %
Lymphs Abs: 1.5 10*3/uL (ref 0.7–4.0)
MCH: 27.7 pg (ref 26.0–34.0)
MCHC: 33.2 g/dL (ref 30.0–36.0)
MCV: 83.6 fL (ref 78.0–100.0)
Monocytes Absolute: 0.7 10*3/uL (ref 0.1–1.0)
Monocytes Relative: 8 %
Neutro Abs: 6.3 10*3/uL (ref 1.7–7.7)
Neutrophils Relative %: 71 %
Platelets: 160 10*3/uL (ref 150–400)
RBC: 4.58 MIL/uL (ref 4.22–5.81)
RDW: 15.1 % (ref 11.5–15.5)
WBC: 8.7 10*3/uL (ref 4.0–10.5)

## 2016-06-06 LAB — COMPREHENSIVE METABOLIC PANEL
ALT: 20 U/L (ref 17–63)
AST: 36 U/L (ref 15–41)
Albumin: 3.3 g/dL — ABNORMAL LOW (ref 3.5–5.0)
Alkaline Phosphatase: 54 U/L (ref 38–126)
Anion gap: 6 (ref 5–15)
BUN: 14 mg/dL (ref 6–20)
CO2: 28 mmol/L (ref 22–32)
Calcium: 9.2 mg/dL (ref 8.9–10.3)
Chloride: 107 mmol/L (ref 101–111)
Creatinine, Ser: 1.02 mg/dL (ref 0.61–1.24)
GFR calc Af Amer: >60 mL/min (ref >60)
GFR calc non Af Amer: >60 mL/min (ref >60)
Glucose, Bld: 163 mg/dL — ABNORMAL HIGH (ref 65–99)
Potassium: 4.8 mmol/L (ref 3.5–5.1)
Sodium: 141 mmol/L (ref 135–145)
Total Bilirubin: 1 mg/dL (ref 0.3–1.2)
Total Protein: 6.6 g/dL (ref 6.5–8.1)

## 2016-06-06 LAB — PROTIME-INR
INR: 2.21
Prothrombin Time: 24.9 seconds — ABNORMAL HIGH (ref 11.4–15.2)

## 2016-06-06 LAB — I-STAT TROPONIN, ED: Troponin i, poc: 0 ng/mL (ref 0.00–0.08)

## 2016-06-06 MED ORDER — MECLIZINE HCL 12.5 MG PO TABS: 25.0000 mg | ORAL_TABLET | Freq: Three times a day (TID) | ORAL | Status: DC | PRN

## 2016-06-06 MED ORDER — ATORVASTATIN CALCIUM 40 MG PO TABS
40.0000 mg | ORAL_TABLET | Freq: Every day | ORAL | Status: DC
  Administered 2016-06-07: 40 mg via ORAL
  Filled 2016-06-06 (×2): qty 1

## 2016-06-06 MED ORDER — NITROGLYCERIN 0.4 MG SL SUBL: 0.4000 mg | SUBLINGUAL_TABLET | SUBLINGUAL | Status: DC | PRN

## 2016-06-06 MED ORDER — STROKE: EARLY STAGES OF RECOVERY BOOK
Freq: Once | Status: AC
  Administered 2016-06-07: 1
  Filled 2016-06-06 (×2): qty 1

## 2016-06-06 MED ORDER — INSULIN GLARGINE 100 UNIT/ML ~~LOC~~ SOLN
34.0000 [IU] | Freq: Every day | SUBCUTANEOUS | Status: DC
  Administered 2016-06-06 – 2016-06-07 (×2): 34 [IU] via SUBCUTANEOUS
  Filled 2016-06-06 (×2): qty 0.34

## 2016-06-06 MED ORDER — DOXYCYCLINE HYCLATE 100 MG PO TABS
100.0000 mg | ORAL_TABLET | Freq: Two times a day (BID) | ORAL | Status: DC
  Administered 2016-06-06 – 2016-06-08 (×4): 100 mg via ORAL
  Filled 2016-06-06 (×5): qty 1

## 2016-06-06 MED ORDER — PANTOPRAZOLE SODIUM 40 MG PO TBEC
40.0000 mg | DELAYED_RELEASE_TABLET | Freq: Every day | ORAL | Status: DC
  Administered 2016-06-06 – 2016-06-08 (×3): 40 mg via ORAL
  Filled 2016-06-06 (×3): qty 1

## 2016-06-06 MED ORDER — TAMSULOSIN HCL 0.4 MG PO CAPS
0.4000 mg | ORAL_CAPSULE | ORAL | Status: DC
  Administered 2016-06-06 – 2016-06-08 (×2): 0.4 mg via ORAL
  Filled 2016-06-06 (×2): qty 1

## 2016-06-06 MED ORDER — WARFARIN - PHARMACIST DOSING INPATIENT: Freq: Every day | Status: DC

## 2016-06-06 MED ORDER — SODIUM CHLORIDE 0.9 % IV BOLUS (SEPSIS)
500.0000 mL | Freq: Once | INTRAVENOUS | Status: AC
Start: 2016-06-06 — End: 2016-06-06
  Administered 2016-06-06: 500 mL via INTRAVENOUS

## 2016-06-06 MED ORDER — WARFARIN SODIUM 7.5 MG PO TABS
7.5000 mg | ORAL_TABLET | Freq: Once | ORAL | Status: AC
  Administered 2016-06-06: 7.5 mg via ORAL
  Filled 2016-06-06: qty 1

## 2016-06-06 MED ORDER — ASPIRIN 325 MG PO TABS
325.0000 mg | ORAL_TABLET | Freq: Once | ORAL | Status: AC
  Administered 2016-06-06: 325 mg via ORAL
  Filled 2016-06-06: qty 1

## 2016-06-06 NOTE — Progress Notes (Signed)
Patient admitted to [phone]. Patient is alert, oriented x4, c/o pain in posterior neck, skin intact. Oriented to room, unit, plan of care. Will continue to monitor

## 2016-06-06 NOTE — ED Triage Notes (Signed)
Pt presents with continued dizziness, seen here a few days ago for same.  Pt has had MRI and CT scans which were negative.  Pt reports this morning at 0200, he got up to go to the bathroom, was dizzy and fell.  Pt reports he was able to get back to bed but was still dizzy when he woke this morning.

## 2016-06-06 NOTE — Progress Notes (Signed)
ANTICOAGULATION CONSULT NOTE - Initial Consult  Pharmacy Consult for Coumadin Indication: atrial fibrillation  Allergies  Allergen Reactions  . Erythromycin Base   . Amoxicillin Other (See Comments)    Unknown  . Cefadroxil Other (See Comments)    Unknown  . Ciprofloxacin Other (See Comments)    Unknown  . Erythromycin Other (See Comments)    Upset stomach    Patient Measurements:    Height: 5\' 7"  (170.2 cm) Weight: 211 lb 1.6 oz (95.8 kg) IBW/kg (Calculated) : 66.1  Vital Signs: Temp: 97.9 F (36.6 C) (04/08 1546) Temp Source: Oral (04/08 1113) BP: 147/92 (04/08 1615) Pulse Rate: 69 (04/08 1615)  Labs:  Recent Labs  06/06/16 1143 06/06/16 1144  HGB 12.7*  --   HCT 38.3*  --   PLT 160  --   LABPROT  --  24.9*  INR  --  2.21  CREATININE 1.02  --     Estimated Creatinine Clearance: 57.4 mL/min (by C-G formula based on SCr of 1.02 mg/dL).   Medical History: Past Medical History:  Diagnosis Date  . Abdominal pain   . Acute renal failure ([place])     resolved  . Arthritis    "hands & legs" (11/'10/2014)  . Ataxia   . Atrial fibrillation ([place])   . Bladder outlet obstruction   . Bladder outlet obstruction   . BPH (benign prostatic hyperplasia)   . CAD (coronary artery disease)    a. CABG IN 1989. b. 01/08/2015 CTO of ost LAD, LIMA to LAD not visualized but assumed patent given myoview finding, occluded SVG to diagonal, 99% mid RCA tx w/ SYNERGY DES 3X28 mm  . Constipation   . Diabetes mellitus type 2, insulin dependent ([place])   . Epistaxis, recurrent Feb 2017  . GERD (gastroesophageal reflux disease)   . HTN (hypertension)   . Hyperlipemia   . Kidney stones   . Mild aortic stenosis   . Rib fractures    left rib fractures being treated with pain medications  . Stented coronary artery Nov 2016   RCA DES  . Urinary tract infection     Enterococcus    Medications:  Lovenox 100mg  SQ q12h (last dose 4/6) Coumadin 5mg  daily except 7.5mg  on  Weds/Sun  Assessment: [name] a 83 y.o.malewith PMH atrial fibrillation on warfarin PTA.  He was recently admitted 06/02/16 > 06/03/16 for management of vertigo and was noted to have a subtherapeutic INR.  He was discharged home with Lovenox bridging.  Pt returns to the ED today (4/8) with complaints of dizziness and neck pain.  MRI showed new CVA.  Neuro recommends to continue Coumadin.  INR is therapeutic.  Last Lovenox dose 4/6.    Goal of Therapy:  INR 2-3 Monitor platelets by anticoagulation protocol: Yes   Plan:  Coumadin 7.5mg  PO x 1 tonight Daily INR.  [employer], Pharm.D., BCPS Clinical Pharmacist Pager [phone] 06/06/2016 5:15 PM

## 2016-06-06 NOTE — Consult Note (Signed)
Neurology Consult Note  Reason for Consultation: Stroke identified on MRI brain   Requesting provider: Shirlyn Goltz, MD  CC: Dizziness  HPI: This is an 83 year old right-handed man who presented to the emergency room for evaluation of dizziness. History is obtained directly from the patient who is a good historian. His wife and daughter are at the bedside and offer additional information as needed.  The patient reports that he started having some dizziness about 5 days ago. When asked to characterize his dizziness, he describes feeling fuzzy and having difficulty focusing. He notes that his symptoms worsened with head movement. He has a long-standing history of intermittent vertigo and says that this dizziness is different because he did not appreciate any sense of motion with his current episode. He was admitted to [facility] from 4/4-06/03/16 because of these symptoms. During that admission, he was found to have community-acquired pneumonia after CT scan of the neck showed patchy consolidation in the right upper lobe which was accompanied by peripheral leukocytosis. He was placed on doxycycline. He was also noted to be dehydrated with possible orthostatic hypotension and was given IV fluids with improvement in blood pressure. CT angiogram of the head and neck were obtained and did not show any culpable pathology to explain his dizziness. He was noted to have an unremarkable neurologic examination. MRI scan was reportedly obtained at an outside facility and per notes showed no evidence of acute infarct. He was maintained on meclizine and discharged home after reporting improvement in symptoms.  He reports that he actually did well for the past 2 days with minimal dizziness. However, this morning, he got up to go to the bathroom and says that he suddenly felt dizzy again. This time it sounds as if he did have a sense of motion with symptoms more suggestive of vertigo. He's also had some nausea associated with  his symptoms. He has some left-sided neck pain which has been present for the past 5-7 days. He has not appreciated any weakness, numbness, tingling, vision loss, double vision, slurred speech, difficulty swallowing, language disturbances. Today, he has had difficulty walking because he has felt off balance.  Last known well: five days ago NIHSS score: 0 mRS score: 0 tPA given?: no, well outside of window   PMH:  Past Medical History:  Diagnosis Date  . Abdominal pain   . Acute renal failure ([place])     resolved  . Arthritis    "hands & legs" (11/'10/2014)  . Ataxia   . Atrial fibrillation ([place])   . Bladder outlet obstruction   . Bladder outlet obstruction   . BPH (benign prostatic hyperplasia)   . CAD (coronary artery disease)    a. CABG IN 1989. b. 01/08/2015 CTO of ost LAD, LIMA to LAD not visualized but assumed patent given myoview finding, occluded SVG to diagonal, 99% mid RCA tx w/ SYNERGY DES 3X28 mm  . Constipation   . Diabetes mellitus type 2, insulin dependent ([place])   . Epistaxis, recurrent Feb 2017  . GERD (gastroesophageal reflux disease)   . HTN (hypertension)   . Hyperlipemia   . Kidney stones   . Mild aortic stenosis   . Rib fractures    left rib fractures being treated with pain medications  . Stented coronary artery Nov 2016   RCA DES  . Urinary tract infection     Enterococcus    PSH:  Past Surgical History:  Procedure Laterality Date  . CARDIAC CATHETERIZATION  1989  . CARDIAC  CATHETERIZATION N/A 01/08/2015   Procedure: Left Heart Cath and Cors/Grafts Angiography;  Surgeon: Peter M Martinique, MD;  Location: [facility] [place] [facility];  Service: Cardiovascular;  Laterality: N/A;  . CARDIAC CATHETERIZATION  01/08/2015   Procedure: Coronary Stent Intervention;  Surgeon: Peter M Martinique, MD;  Location: [place] [facility];  Service: Cardiovascular;;  . CARPAL TUNNEL RELEASE Right 08/2010  . CATARACT EXTRACTION W/ INTRAOCULAR LENS  IMPLANT, BILATERAL Bilateral   . CORONARY  ANGIOPLASTY  01/08/15   RCA DES  . CORONARY ARTERY BYPASS GRAFT  1989   "CABG X 2"  . JOINT REPLACEMENT    . TONSILLECTOMY    . TOTAL HIP ARTHROPLASTY Right 2000    Family history: Family History  Problem Relation Age of Onset  . Brain cancer Father   . Throat cancer Brother     Social history:  Social History   Social History  . Marital status: Married    Spouse name: N/A  . Number of children: 3  . Years of education: N/A   Occupational History  . Agricultural consultant    Social History Main Topics  . Smoking status: Former Smoker    Packs/day: 3.00    Years: 10.00    Types: Cigarettes    Quit date: 05/11/1958  . Smokeless tobacco: Never Used  . Alcohol use No  . Drug use: No  . Sexual activity: Not Currently   Other Topics Concern  . Not on file   Social History Narrative  . No narrative on file    Current outpatient meds: Medications reviewed and reconciled Current Meds  Medication Sig  . AVODART 0.5 MG capsule Take 0.5 mg by mouth every other day.   . diltiazem (CARDIZEM CD) 360 MG 24 hr capsule Take 1 capsule (360 mg total) by mouth daily.  [name] doxycycline (VIBRA-TABS) 100 MG tablet Take 1 tablet (100 mg total) by mouth every 12 (twelve) hours.  . empagliflozin (JARDIANCE) 10 MG TABS tablet Take 10 mg by mouth daily.  . insulin glargine (LANTUS) 100 UNIT/ML injection Inject 34 Units into the skin daily.   . meclizine (ANTIVERT) 25 MG tablet Take 1 tablet (25 mg total) by mouth 3 (three) times daily as needed for dizziness.  . metFORMIN (GLUCOPHAGE) 1000 MG tablet Take 1 tablet by mouth 2 (two) times daily.  . methocarbamol (ROBAXIN) 500 MG tablet Take 1 tablet (500 mg total) by mouth every 8 (eight) hours as needed for muscle spasms.  . metoprolol succinate (TOPROL-XL) 100 MG 24 hr tablet Take 1 tablet (100 mg total) by mouth 2 (two) times daily.  . pantoprazole (PROTONIX) 40 MG tablet Take 1 tablet by mouth daily at [facility]  . simvastatin (ZOCOR) 80 MG tablet  Take 40 mg by mouth at bedtime.   [name] warfarin (COUMADIN) 5 MG tablet Take 1 to 1.5 tablets by mouth daily as directed by coumadin clinic (Patient taking differently: Take 5-7.5 mg by mouth See admin instructions. Take 5mg  daily except Sunday and Wednesday take 7.5mg )    Current inpatient meds: Medications reviewed and reconciled No current facility-administered medications for this encounter.    Current Outpatient Prescriptions  Medication Sig Dispense Refill  . AVODART 0.5 MG capsule Take 0.5 mg by mouth every other day.     . diltiazem (CARDIZEM CD) 360 MG 24 hr capsule Take 1 capsule (360 mg total) by mouth daily. 30 capsule 6  . doxycycline (VIBRA-TABS) 100 MG tablet Take 1 tablet (100 mg total) by mouth every  12 (twelve) hours. 10 tablet 0  . empagliflozin (JARDIANCE) 10 MG TABS tablet Take 10 mg by mouth daily.    . insulin glargine (LANTUS) 100 UNIT/ML injection Inject 34 Units into the skin daily.     . meclizine (ANTIVERT) 25 MG tablet Take 1 tablet (25 mg total) by mouth 3 (three) times daily as needed for dizziness. 30 tablet 0  . metFORMIN (GLUCOPHAGE) 1000 MG tablet Take 1 tablet by mouth 2 (two) times daily.    . methocarbamol (ROBAXIN) 500 MG tablet Take 1 tablet (500 mg total) by mouth every 8 (eight) hours as needed for muscle spasms. 30 tablet 0  . metoprolol succinate (TOPROL-XL) 100 MG 24 hr tablet Take 1 tablet (100 mg total) by mouth 2 (two) times daily. 180 tablet 3  . pantoprazole (PROTONIX) 40 MG tablet Take 1 tablet by mouth daily at Noon 90 tablet 3  . simvastatin (ZOCOR) 80 MG tablet Take 40 mg by mouth at bedtime.     [name] warfarin (COUMADIN) 5 MG tablet Take 1 to 1.5 tablets by mouth daily as directed by coumadin clinic (Patient taking differently: Take 5-7.5 mg by mouth See admin instructions. Take 5mg  daily except Sunday and Wednesday take 7.5mg ) 120 tablet 1  . enoxaparin (LOVENOX) 100 MG/ML injection Inject 1 mL (100 mg total) into the skin every 12 (twelve) hours.  (Patient not taking: Reported on 06/06/2016) 6 Syringe 0  . nitroGLYCERIN (NITROSTAT) 0.4 MG SL tablet Place 0.4 mg under the tongue every 5 (five) minutes as needed for chest pain (x 3 doses).    . Tamsulosin HCl (FLOMAX) 0.4 MG CAPS Take 0.4 mg by mouth every other day.       Allergies: Allergies  Allergen Reactions  . Erythromycin Base   . Amoxicillin Other (See Comments)    Unknown  . Cefadroxil Other (See Comments)    Unknown  . Ciprofloxacin Other (See Comments)    Unknown  . Erythromycin Other (See Comments)    Upset stomach    ROS: As per HPI. A full 14-point review of systems was performed and is otherwise unremarkable.   PE:  BP (!) 147/92   Pulse 69   Temp 97.9 F (36.6 C)   Resp 19   SpO2 99%   General: WD obese Caucasian man lying on ED gurney in no acute distress. AAO x4. He is slightly hard of hearing. Speech clear, no dysarthria. No aphasia. Follows commands briskly. Affect is bright with congruent mood. Comportment is normal.  HEENT: Normocephalic. Neck supple without LAD. MMM, OP clear. Dentition good. Sclerae anicteric. No conjunctival injection.  CV: Regular, distant, no obvious murmur. Carotid pulses full and symmetric, no bruits. Distal pulses 2+ and symmetric.  Lungs: CTAB on anterior auscultation.  Abdomen: Soft, obese, non-distended, non-tender. Bowel sounds present x4.  Extremities: No C/C/E. Neuro:  CN: Pupils are equal and round. They are symmetrically reactive from 3-->2 mm. visual fields are full to confrontation. EOMI without nystagmus. No reported diplopia. Facial sensation is intact to light touch. Face is symmetric at rest with normal strength and mobility. Hearing is intact to conversational voice. Palate elevates symmetrically and uvula is midline. Voice is normal in tone, pitch and quality. Bilateral SCM and trapezii are 5/5 though he has decreased range of motion because of left-sided posterior neck pain. Tongue is midline with normal bulk and  mobility.  Motor: Normal bulk, tone, and strength. No tremor or other abnormal movements. No drift.  Sensation: Intact to light  touch.  DTRs: 2+, symmetric. Toes downgoing bilaterally. No pathologic reflexes.  Coordination: Finger-to-nose and heel-to-shin are without dysmetria. Right finger-nose is slightly more deliberate and may be a little bit slower than the left but again, no overt dysmetria. Finger taps are normal in amplitude and speed, no decrement.    Labs:  Lab Results  Component Value Date   WBC 8.7 06/06/2016   HGB 12.7 (L) 06/06/2016   HCT 38.3 (L) 06/06/2016   PLT 160 06/06/2016   GLUCOSE 163 (H) 06/06/2016   ALT 20 06/06/2016   AST 36 06/06/2016   NA 141 06/06/2016   K 4.8 06/06/2016   CL 107 06/06/2016   CREATININE 1.02 06/06/2016   BUN 14 06/06/2016   CO2 28 06/06/2016   PSA 106.83 Test Methodology: Hybritech PSA (H) 11/09/2009   INR 2.21 06/06/2016   HGBA1C 9.0 (H) 06/02/2016   Troponin 0.00   Imaging:  I have personally and independently reviewed the MRI scan of the brain from today. There is a tiny area of restricted diffusion involving the lateral aspect of the left parietal lobe. This is cortical in location and is consistent with acute ischemic infarction. A mild burden of scattered T2/FLAIR hyperintensities are present at the bihemispheric white matter, consistent with chronic small vessel ischemic disease. He has mild diffuse generalized atrophy. Visualized intracranial flow voids appear normal with a diminutive basilar artery.  I have personally and independently reviewed CT angiogram of the head and neck from 06/02/16. This shows significant atherosclerotic calcification of the intracranial and cervical portions of both internal carotid arteries. There is 60% stenosis of the proximal RICA. Persistent fetal origins of both posterior cerebral arteries are present. The basilar artery appears diminutive but this is consistent with bilateral posterior posterior  Struble arteries. No obvious occlusive disease is appreciated. There is no clear evidence of dissection.  I have personally and independently reviewed the MRI scan of the cervical spine without contrast from today. This shows significant multilevel degenerative spondylotic changes. Interbody fusion is noted at C5-6 and C to 6-7. Bulging disks and disc complexes are noted at C3-4, C4-5 with mass resulting in moderate central stenosis with mass effect on the thecal sac. The spinal cord itself appears normal in signal and caliber.  Assessment and Plan:  1. Acute Ischemic Stroke: This is an acute stroke involving the cortex of the left parietal lobe in the distal L MCA territory. It is most likely embolic in etiology. Known risk factors for cerebrovascular disease in this patient include atrial fibrillation, coronary artery disease, diabetes type 2, hypertension, hyperlipidemia, and age. Agree with TTE and fasting lipids to complete stroke evaluation. CT angiogram of the head and neck were just obtained a few days ago and showed 60% stenosis of the right internal carotid artery, no other significant pathology. Hemoglobin A1c was 9.0 on 06/02/16.  Continue warfarin for secondary stroke prevention. He is presently therapeutic with INR 2.2. Continue statin with goal LDL less than 70. Ensure adequate glucose control. Allow permissive hypertension in the acute phase, treating only SBP greater than 220 mmHg and/or DBP greater than 110 mmHg. Avoid fever and hyperglycemia as these are associated with worse neurological outcomes. Initiate rehab services. DVT prophylaxis as needed.  2. Dizziness: His stroke is near the vestibular cortex and its connections which could explain his symptoms. For the past few days he describes vague dizziness but today sounds like he may have had more of a vestibular component to his symptoms. No objective findings on  his examination. Physical therapy/vestibular rehabilitation as needed. He  reports little benefit from meclizine and Valium. Consideration could be given to a trial of Klonopin as this can be effective in refractory cases of vertigo. In general, however, pharmacologic intervention should be limited as it can interfere with the brain's ability to compensate for central vertigo.   3. Abnormality of gait: This is acute, seemingly due to vertiginous symptoms earlier today. I suspect that he also has a component of diabetic peripheral neuropathy which would increase his risk of gait disturbances and falls as well. PT as needed. He has been orthostatic in the past, would consider checking orthostatics to ensure that this is not still contributing. I will also check a vitamin B12 level as this could contribute to neuropathy if deficient.  4. Cervical spondylosis: MRI of the cervical spine today show significant degenerative change with some moderate spinal stenosis. The cord, however, appears to be normal. Recommend symptomatically management of neck pain with conservative measures including heat/ice, massage, and nonsteroidal anti-inflammatory medications.  This was discussed with the patient, his wife, and his daughter. Education was provided on the diagnosis and expected evaluation and treatment. They are in agreement with the plan as noted. They were given the opportunity to ask any questions and these were addressed to their satisfaction.   Thank you for allowing [place] to participate in Mr Tkach's care. The stroke team will assume care of the patient beginning 06/07/16. Please call with any urgent questions or concerns.

## 2016-06-06 NOTE — ED Notes (Signed)
Patient transported to X-ray 

## 2016-06-06 NOTE — ED Provider Notes (Signed)
[facility] DEPT Provider Note   CSN: [id number] Arrival date & time:        History   Chief Complaint Chief Complaint  Patient presents with  . Fall    HPI [name] is a 83 y.o. male hx of renal failure, afib on coumadin, CAD, DM, HTN, Here presenting with dizziness. Patient has chronic vertigo and has been seen in the past multiple times for this. He actually had vestibular rehabilitation which works occasionally. He has tried meclizine, Valium but they have not helped in the past. Patient was recently seen for this and had neg MRI brain on 4/3. He had CT angio head/neck that showed 60% stenosis R ICA and pneumonia. He was admitted and finished a course of doxycyline for pneumonia. Patient states that he got up from bed this morning around 2 AM and went to the bathroom and felt dizzy and fell backwards and hit the back of his head. Patient was able to crawl back to bed and woke around 8 AM and felt dizzy again and called EMS to come here. Patient denies any chest pain or cough or fevers. Patient denies passing out.  The history is provided by the patient.    Past Medical History:  Diagnosis Date  . Abdominal pain   . Acute renal failure ([place])     resolved  . Arthritis    "hands & legs" (11/'10/2014)  . Ataxia   . Atrial fibrillation ([place])   . Bladder outlet obstruction   . Bladder outlet obstruction   . BPH (benign prostatic hyperplasia)   . CAD (coronary artery disease)    a. CABG IN 1989. b. 01/08/2015 CTO of ost LAD, LIMA to LAD not visualized but assumed patent given myoview finding, occluded SVG to diagonal, 99% mid RCA tx w/ SYNERGY DES 3X28 mm  . Constipation   . Diabetes mellitus type 2, insulin dependent ([place])   . Epistaxis, recurrent Feb 2017  . GERD (gastroesophageal reflux disease)   . HTN (hypertension)   . Hyperlipemia   . Kidney stones   . Mild aortic stenosis   . Rib fractures    left rib fractures being treated with pain medications  . Stented  coronary artery Nov 2016   RCA DES  . Urinary tract infection     Enterococcus    Patient Active Problem List   Diagnosis Date Noted  . Dehydration 06/02/2016  . CAP (community acquired pneumonia) 06/02/2016  . Leukocytosis 06/02/2016  . Acute muscle stiffness of neck 06/02/2016  . Dizziness 06/02/2016  . Carotid stenosis 06/02/2016  . Ataxia   . Epistaxis, recurrent 04/11/2015  . CAD S/P PCI- Nov 2016   . Abnormal nuclear stress test 01/08/2015  . Vertigo, benign positional 06/25/2014  . Encounter for therapeutic drug monitoring 04/13/2013  . Chronic anticoagulation 09/09/2011  . Chronic atrial fibrillation ([place]) 07/30/2011  . Hx of CABG x 2 1990   . HTN (hypertension)   . Diabetes mellitus type 2, insulin dependent ([place])   . Hyperlipemia   . BPH (benign prostatic hyperplasia)     Past Surgical History:  Procedure Laterality Date  . CARDIAC CATHETERIZATION  1989  . CARDIAC CATHETERIZATION N/A 01/08/2015   Procedure: Left Heart Cath and Cors/Grafts Angiography;  Surgeon: Peter M Martinique, MD;  Location: [facility];  Service: Cardiovascular;  Laterality: N/A;  . CARDIAC CATHETERIZATION  01/08/2015   Procedure: Coronary Stent Intervention;  Surgeon: Peter M Martinique, MD;  Location: [facility];  Service: Cardiovascular;;  . CARPAL TUNNEL RELEASE Right 08/2010  . CATARACT EXTRACTION W/ INTRAOCULAR LENS  IMPLANT, BILATERAL Bilateral   . CORONARY ANGIOPLASTY  01/08/15   RCA DES  . CORONARY ARTERY BYPASS GRAFT  1989   "CABG X 2"  . JOINT REPLACEMENT    . TONSILLECTOMY    . TOTAL HIP ARTHROPLASTY Right 2000       Home Medications    Prior to Admission medications   Medication Sig Start Date End Date Taking? Authorizing Provider  AVODART 0.5 MG capsule Take 0.5 mg by mouth every other day.  07/20/11  Yes Historical Provider, MD  diltiazem (CARDIZEM CD) 360 MG 24 hr capsule Take 1 capsule (360 mg total) by mouth daily. 01/10/15  Yes Peter M Martinique, MD  doxycycline  (VIBRA-TABS) 100 MG tablet Take 1 tablet (100 mg total) by mouth every 12 (twelve) hours. 06/03/16  Yes Maryann Mikhail, DO  empagliflozin (JARDIANCE) 10 MG TABS tablet Take 10 mg by mouth daily.   Yes Historical Provider, MD  insulin glargine (LANTUS) 100 UNIT/ML injection Inject 34 Units into the skin daily.    Yes Historical Provider, MD  meclizine (ANTIVERT) 25 MG tablet Take 1 tablet (25 mg total) by mouth 3 (three) times daily as needed for dizziness. 06/03/16  Yes Maryann Mikhail, DO  metFORMIN (GLUCOPHAGE) 1000 MG tablet Take 1 tablet by mouth 2 (two) times daily. 05/20/15  Yes Historical Provider, MD  methocarbamol (ROBAXIN) 500 MG tablet Take 1 tablet (500 mg total) by mouth every 8 (eight) hours as needed for muscle spasms. 06/03/16  Yes Maryann Mikhail, DO  metoprolol succinate (TOPROL-XL) 100 MG 24 hr tablet Take 1 tablet (100 mg total) by mouth 2 (two) times daily. 06/16/15  Yes Peter M Martinique, MD  pantoprazole (PROTONIX) 40 MG tablet Take 1 tablet by mouth daily at [facility] 12/22/15  Yes Almyra Deforest, PA  simvastatin (ZOCOR) 80 MG tablet Take 40 mg by mouth at bedtime.    Yes Historical Provider, MD  warfarin (COUMADIN) 5 MG tablet Take 1 to 1.5 tablets by mouth daily as directed by coumadin clinic Patient taking differently: Take 5-7.5 mg by mouth See admin instructions. Take 5mg  daily except Sunday and Wednesday take 7.5mg  04/20/16  Yes Peter M Martinique, MD  enoxaparin (LOVENOX) 100 MG/ML injection Inject 1 mL (100 mg total) into the skin every 12 (twelve) hours. Patient not taking: Reported on 06/06/2016 06/03/16   Velta Addison Mikhail, DO  nitroGLYCERIN (NITROSTAT) 0.4 MG SL tablet Place 0.4 mg under the tongue every 5 (five) minutes as needed for chest pain (x 3 doses).    Historical Provider, MD  Tamsulosin HCl (FLOMAX) 0.4 MG CAPS Take 0.4 mg by mouth every other day.     Historical Provider, MD    Family History Family History  Problem Relation Age of Onset  . Brain cancer Father   . Throat cancer  Brother     Social History Social History  Substance Use Topics  . Smoking status: Former Smoker    Packs/day: 3.00    Years: 10.00    Types: Cigarettes    Quit date: 05/11/1958  . Smokeless tobacco: Never Used  . Alcohol use No     Allergies   Erythromycin base; Amoxicillin; Cefadroxil; Ciprofloxacin; and Erythromycin   Review of Systems Review of Systems  Neurological: Positive for dizziness.  All other systems reviewed and are negative.    Physical Exam Updated Vital Signs BP 140/73   Pulse 71  Temp 97.9 F (36.6 C) (Oral)   Resp 18   SpO2 99%   Physical Exam  Constitutional:  Chronically ill   HENT:  Head: Normocephalic.  Right Ear: External ear normal.  Left Ear: External ear normal.  Abrasion posterior scalp, no obvious hematoma. MM slightly dry   Eyes: EOM are normal. Pupils are equal, round, and reactive to light.  No obvious nystagmus   Neck:  Mild R paracervical tenderness, no obvious deformity   Cardiovascular: Normal rate, regular rhythm and normal heart sounds.   Pulmonary/Chest: Effort normal and breath sounds normal. No respiratory distress. He has no wheezes. He has no rales.  Abdominal: Soft. Bowel sounds are normal. He exhibits no distension. There is no tenderness.  Musculoskeletal: Normal range of motion. He exhibits no edema or tenderness.  No obvious extremity trauma. No spinal tenderness   Neurological:  A & O x 3. Nl strength throughout. Nl sensation throughout   Skin: Skin is warm.  Psychiatric: He has a normal mood and affect.  Nursing note and vitals reviewed.    ED Treatments / Results  Labs (all labs ordered are listed, but only abnormal results are displayed) Labs Reviewed  CBC WITH DIFFERENTIAL/PLATELET - Abnormal; Notable for the following:       Result Value   Hemoglobin 12.7 (*)    HCT 38.3 (*)    All other components within normal limits  COMPREHENSIVE METABOLIC PANEL - Abnormal; Notable for the following:     Glucose, Bld 163 (*)    Albumin 3.3 (*)    All other components within normal limits  PROTIME-INR - Abnormal; Notable for the following:    Prothrombin Time 24.9 (*)    All other components within normal limits  I-STAT TROPOININ, ED  I-STAT CG4 LACTIC ACID, ED    EKG  EKG Interpretation  Date/Time:  Sunday June 06 2016 11:18:13 EDT Ventricular Rate:  86 PR Interval:    QRS Duration: 111 QT Interval:  395 QTC Calculation: 473 R Axis:   55 Text Interpretation:  Atrial fibrillation Borderline repolarization abnormality No significant change since last tracing Confirmed by Azelyn Batie  MD, Italo Banton ([id number]) on 06/06/2016 12:04:18 PM       Radiology Dg Chest 2 View  Result Date: 06/06/2016 CLINICAL DATA:  Dizziness, recent fall, shortness of breath, chest pain and cough EXAM: CHEST  2 VIEW COMPARISON:  01/06/2015 FINDINGS: Cardiomegaly evident with mild central vascular congestion. No superimposed CHF, edema, collapse, consolidation or pneumonia. No large effusion or pneumothorax. Aorta is atherosclerotic and tortuous. Remote coronary bypass changes. Chronic diffuse thoracic spondylosis. Mild increased thoracic kyphosis. No severe compression fracture deformity. All posterolateral rib fractures. IMPRESSION: Stable cardiomegaly without CHF or pneumonia. No significant interval change or superimposed acute process Thoracic aortic atherosclerosis and tortuosity Electronically Signed   By: Jerilynn Mages.  Shick M.D.   On: 06/06/2016 12:23   Mr Brain Wo Contrast  Result Date: 06/06/2016 CLINICAL DATA:  Dizziness with fall. EXAM: MRI HEAD WITHOUT CONTRAST TECHNIQUE: Multiplanar, multiecho pulse sequences of the brain and surrounding structures were obtained without intravenous contrast. COMPARISON:  Head CT and CTA 06/02/2016 FINDINGS: Brain: 3 mm acute infarct in the low left parietal cortex. No brainstem or cerebellar infarct or edema to explain history of dizziness. No hemorrhage, hydrocephalus, or mass. There is  generalized cerebral volume loss that is mild for age. Mild chronic microvascular ischemic change in the cerebral white matter. Vascular: Normal flow voids. Skull and upper cervical spine: No marrow lesion noted. Sinuses/Orbits:  Mild mucosal thickening in the paranasal sinuses. Negative temporal bones. Grossly symmetric labyrinthine signal. Bilateral cataract resection. IMPRESSION: 1. 3 mm acute infarct in the left parietal cortex. 2. No findings in the brainstem or cerebellum to explain dizziness. 3. Age congruent senescent changes. Electronically Signed   By: Monte Fantasia M.D.   On: 06/06/2016 14:54    Procedures Procedures (including critical care time)  Medications Ordered in ED Medications  aspirin tablet 325 mg (not administered)  sodium chloride 0.9 % bolus 500 mL (0 mLs Intravenous Stopped 06/06/16 1336)     Initial Impression / Assessment and Plan / ED Course  I have reviewed the triage vital signs and the nursing notes.  Pertinent labs & imaging results that were available during my care of the patient were reviewed by me and considered in my medical decision making (see chart for details).     [name] is a 83 y.o. male here with recurrent dizziness. This is an acute on chronic problem. Worse dizziness this morning and fell while walking. He tried meclizine, valium with no relief and even tried vestibular rehab. Will get MRI brain/cervical spine to r/o bleed vs stroke. Will get basic labs.   3:24 PM MRI brain showed L parietal acute stroke likely explaining his symptoms. MRI cervical spine still pending. Consulted Dr. Shon Hale to see patient as consult. Given ASA. Will admit for stroke workup.   Final Clinical Impressions(s) / ED Diagnoses   Final diagnoses:  None    New Prescriptions New Prescriptions   No medications on file     Drenda Freeze, MD 06/06/16 1525

## 2016-06-06 NOTE — H&P (Signed)
History and Physical    [name] FBP:[id number] DOB: 05-06-1928 DOA: 06/06/2016  Referring MD/NP/PA: Darl [name] PCP: Jeffrey Arnt, MD  Outpatient Specialists:  Patient coming from: home  Chief Complaint: Dizziness and Neck pain  HPI: [name] is a 83 y.o. male with medical history significant of hypertension, hyperlipidemia, diabetes, CAD with stent placement, Afib on coumadin and chronic vertigo. He presented to the Emergency department today after falling at 2:30 am.  Pt has been having increasing vertigo since 4/1 but worsened today.  Pt reports he felt good when he went to bed at 11pm.  Pt reports falling at 2:30.   Pt reports he began having pain in his neck 2 weeks ago after lifting his wife after she suffered a fall. Pt reports he has had vertigo on and off for years.  His primary MD evaluated him on 4/3 and obtained an MRi which was normal per pt. Pt reports persistent dizziness which caused him to come to the Emergency department.  He was admitted on 4/4 and treated with meclizine and solumedrol.  Pt reports he was improving.  He currently denies any area of weakness, no facial weakness. No numbness   ED Course: Pt had MRI of Brain which shows 3 mm acute infarct in the low left parietal cortex. MRI  of c spine shows c3-c4 Broad based disc protrusion.   Review of Systems: Review of Systems  Constitutional: Negative.   HENT: Negative for trouble swallowing.   Respiratory: Positive for cough.   Cardiovascular: Negative for chest pain.  Gastrointestinal: Negative for abdominal pain.  Genitourinary: Negative for dysuria.  Musculoskeletal: Positive for neck pain and neck stiffness.  Skin: Negative for rash.  Neurological: Positive for dizziness and light-headedness. Negative for weakness and headaches.  Psychiatric/Behavioral: Negative for confusion and decreased concentration.  All other systems reviewed and are negative.   Past Medical History:  Diagnosis Date  .  Abdominal pain   . Acute renal failure ([place])     resolved  . Arthritis    "hands & legs" (11/'10/2014)  . Ataxia   . Atrial fibrillation ([place])   . Bladder outlet obstruction   . Bladder outlet obstruction   . BPH (benign prostatic hyperplasia)   . CAD (coronary artery disease)    a. CABG IN 1989. b. 01/08/2015 CTO of ost LAD, LIMA to LAD not visualized but assumed patent given myoview finding, occluded SVG to diagonal, 99% mid RCA tx w/ SYNERGY DES 3X28 mm  . Constipation   . Diabetes mellitus type 2, insulin dependent ([place])   . Epistaxis, recurrent Feb 2017  . GERD (gastroesophageal reflux disease)   . HTN (hypertension)   . Hyperlipemia   . Kidney stones   . Mild aortic stenosis   . Rib fractures    left rib fractures being treated with pain medications  . Stented coronary artery Nov 2016   RCA DES  . Urinary tract infection     Enterococcus    Past Surgical History:  Procedure Laterality Date  . CARDIAC CATHETERIZATION  1989  . CARDIAC CATHETERIZATION N/A 01/08/2015   Procedure: Left Heart Cath and Cors/Grafts Angiography;  Surgeon: Jeffrey M Martinique, MD;  Location: [facility];  Service: Cardiovascular;  Laterality: N/A;  . CARDIAC CATHETERIZATION  01/08/2015   Procedure: Coronary Stent Intervention;  Surgeon: Jeffrey M Martinique, MD;  Location: [facility];  Service: Cardiovascular;;  . CARPAL TUNNEL RELEASE Right 08/2010  . CATARACT EXTRACTION W/ INTRAOCULAR LENS  IMPLANT, BILATERAL  Bilateral   . CORONARY ANGIOPLASTY  01/08/15   RCA DES  . CORONARY ARTERY BYPASS GRAFT  1989   "CABG X 2"  . JOINT REPLACEMENT    . TONSILLECTOMY    . TOTAL HIP ARTHROPLASTY Right 2000     reports that he quit smoking about 58 years ago. His smoking use included Cigarettes. He has a 30.00 pack-year smoking history. He has never used smokeless tobacco. He reports that he does not drink alcohol or use drugs.  Allergies  Allergen Reactions  . Erythromycin Base   . Amoxicillin Other  (See Comments)    Unknown  . Cefadroxil Other (See Comments)    Unknown  . Ciprofloxacin Other (See Comments)    Unknown  . Erythromycin Other (See Comments)    Upset stomach    Family History  Problem Relation Age of Onset  . Brain cancer Father   . Throat cancer Brother     Prior to Admission medications   Medication Sig Start Date End Date Taking? Authorizing Provider  AVODART 0.5 MG capsule Take 0.5 mg by mouth every other day.  07/20/11  Yes Historical Provider, MD  diltiazem (CARDIZEM CD) 360 MG 24 hr capsule Take 1 capsule (360 mg total) by mouth daily. 01/10/15  Yes Jeffrey M Martinique, MD  doxycycline (VIBRA-TABS) 100 MG tablet Take 1 tablet (100 mg total) by mouth every 12 (twelve) hours. 06/03/16  Yes Maryann Mikhail, DO  empagliflozin (JARDIANCE) 10 MG TABS tablet Take 10 mg by mouth daily.   Yes Historical Provider, MD  insulin glargine (LANTUS) 100 UNIT/ML injection Inject 34 Units into the skin daily.    Yes Historical Provider, MD  meclizine (ANTIVERT) 25 MG tablet Take 1 tablet (25 mg total) by mouth 3 (three) times daily as needed for dizziness. 06/03/16  Yes Maryann Mikhail, DO  metFORMIN (GLUCOPHAGE) 1000 MG tablet Take 1 tablet by mouth 2 (two) times daily. 05/20/15  Yes Historical Provider, MD  methocarbamol (ROBAXIN) 500 MG tablet Take 1 tablet (500 mg total) by mouth every 8 (eight) hours as needed for muscle spasms. 06/03/16  Yes Maryann Mikhail, DO  metoprolol succinate (TOPROL-XL) 100 MG 24 hr tablet Take 1 tablet (100 mg total) by mouth 2 (two) times daily. 06/16/15  Yes Jeffrey M Martinique, MD  pantoprazole (PROTONIX) 40 MG tablet Take 1 tablet by mouth daily at [facility] 12/22/15  Yes Almyra Deforest, PA  simvastatin (ZOCOR) 80 MG tablet Take 40 mg by mouth at bedtime.    Yes Historical Provider, MD  warfarin (COUMADIN) 5 MG tablet Take 1 to 1.5 tablets by mouth daily as directed by coumadin clinic Patient taking differently: Take 5-7.5 mg by mouth See admin instructions. Take 5mg  daily  except Sunday and Wednesday take 7.5mg  04/20/16  Yes Jeffrey M Martinique, MD  enoxaparin (LOVENOX) 100 MG/ML injection Inject 1 mL (100 mg total) into the skin every 12 (twelve) hours. Patient not taking: Reported on 06/06/2016 06/03/16   Velta Addison Mikhail, DO  nitroGLYCERIN (NITROSTAT) 0.4 MG SL tablet Place 0.4 mg under the tongue every 5 (five) minutes as needed for chest pain (x 3 doses).    Historical Provider, MD  Tamsulosin HCl (FLOMAX) 0.4 MG CAPS Take 0.4 mg by mouth every other day.     Historical Provider, MD    Physical Exam: Vitals:   06/06/16 1330 06/06/16 1339 06/06/16 1345 06/06/16 1546  BP: 140/73 140/73 (!) 124/33   Pulse: 79 71 78   Resp: 15 18 19  Temp:    97.9 F (36.6 C)  TempSrc:      SpO2: 99% 99% 98%       Constitutional: NAD, calm, comfortable Vitals:   06/06/16 1330 06/06/16 1339 06/06/16 1345 06/06/16 1546  BP: 140/73 140/73 (!) 124/33   Pulse: 79 71 78   Resp: 15 18 19    Temp:    97.9 F (36.6 C)  TempSrc:      SpO2: 99% 99% 98%    Eyes: PERRL, lids and conjunctivae normal ENMT: Mucous membranes are moist. Posterior pharynx clear of any exudate or lesions.Normal dentition.  Neck: normal, supple, no masses, no thyromegaly Respiratory: clear to auscultation bilaterally, no wheezing, no crackles. Normal respiratory effort. No accessory muscle use.  Cardiovascular: Regular rate and rhythm, no murmurs / rubs / gallops. No extremity edema. 2+ pedal pulses. No carotid bruits.  Abdomen: no tenderness, no masses palpated. No hepatosplenomegaly. Bowel sounds positive.  Musculoskeletal: no clubbing / cyanosis. No joint deformity upper and lower extremities. Good ROM, no contractures. Normal muscle tone.  Skin: no rashes, lesions, ulcers. No induration Neurologic: CN 2-12 grossly intact. Sensation intact, DTR normal. Strength 5/5 in all 4.  Psychiatric: Normal judgment and insight. Alert and oriented x 3. Normal mood.     Labs on Admission: I have personally  reviewed following labs and imaging studies  CBC:  Recent Labs Lab 06/02/16 0252 06/03/16 0545 06/06/16 1143  WBC 16.6* 11.9* 8.7  NEUTROABS 13.9*  --  6.3  HGB 12.9* 12.3* 12.7*  HCT 39.7 38.1* 38.3*  MCV 83.4 84.1 83.6  PLT 166 189 606   Basic Metabolic Panel:  Recent Labs Lab 06/02/16 0252 06/03/16 0545 06/06/16 1143  NA 138 138 141  K 4.0 4.1 4.8  CL 103 106 107  CO2 25 23 28   GLUCOSE 165* 201* 163*  BUN 17 21* 14  CREATININE 1.10 1.22 1.02  CALCIUM 9.3 9.0 9.2   GFR: Estimated Creatinine Clearance: 57.4 mL/min (by C-G formula based on SCr of 1.02 mg/dL). Liver Function Tests:  Recent Labs Lab 06/06/16 1143  AST 36  ALT 20  ALKPHOS 54  BILITOT 1.0  PROT 6.6  ALBUMIN 3.3*   No results for input(s): LIPASE, AMYLASE in the last 168 hours. No results for input(s): AMMONIA in the last 168 hours. Coagulation Profile:  Recent Labs Lab 06/02/16 0252 06/02/16 0850 06/03/16 0846 06/06/16 1144  INR 1.57 1.55 1.80 2.21   Cardiac Enzymes: No results for input(s): CKTOTAL, CKMB, CKMBINDEX, TROPONINI in the last 168 hours. BNP (last 3 results) No results for input(s): PROBNP in the last 8760 hours. HbA1C: No results for input(s): HGBA1C in the last 72 hours. CBG:  Recent Labs Lab 06/02/16 1719 06/02/16 2214 06/03/16 0806 06/03/16 1201  GLUCAP 185* 298* 166* 216*   Lipid Profile: No results for input(s): CHOL, HDL, LDLCALC, TRIG, CHOLHDL, LDLDIRECT in the last 72 hours. Thyroid Function Tests: No results for input(s): TSH, T4TOTAL, FREET4, T3FREE, THYROIDAB in the last 72 hours. Anemia Panel: No results for input(s): VITAMINB12, FOLATE, FERRITIN, TIBC, IRON, RETICCTPCT in the last 72 hours. Urine analysis:    Component Value Date/Time   COLORURINE YELLOW 06/02/2016 0315   APPEARANCEUR CLEAR 06/02/2016 0315   LABSPEC 1.026 06/02/2016 0315   PHURINE 5.0 06/02/2016 0315   GLUCOSEU >=500 (A) 06/02/2016 0315   HGBUR SMALL (A) 06/02/2016 0315    BILIRUBINUR NEGATIVE 06/02/2016 0315   KETONESUR 5 (A) 06/02/2016 0315   PROTEINUR NEGATIVE 06/02/2016 0315   UROBILINOGEN  0.2 11/28/2010 1156   NITRITE NEGATIVE 06/02/2016 0315   LEUKOCYTESUR NEGATIVE 06/02/2016 0315   Sepsis Labs: @LABRCNTIP (procalcitonin:4,lacticidven:4) ) Recent Results (from the past 240 hour(s))  Blood culture (routine x 2)     Status: None (Preliminary result)   Collection Time: 06/02/16  6:05 AM  Result Value Ref Range Status   Specimen Description BLOOD RIGHT ARM  Final   Special Requests   Final    BOTTLES DRAWN AEROBIC AND ANAEROBIC Blood Culture adequate volume   Culture NO GROWTH 4 DAYS  Final   Report Status PENDING  Incomplete  Blood culture (routine x 2)     Status: None (Preliminary result)   Collection Time: 06/02/16  6:15 AM  Result Value Ref Range Status   Specimen Description BLOOD LEFT HAND  Final   Special Requests   Final    BOTTLES DRAWN AEROBIC AND ANAEROBIC Blood Culture adequate volume   Culture NO GROWTH 4 DAYS  Final   Report Status PENDING  Incomplete     Radiological Exams on Admission: Dg Chest 2 View  Result Date: 06/06/2016 CLINICAL DATA:  Dizziness, recent fall, shortness of breath, chest pain and cough EXAM: CHEST  2 VIEW COMPARISON:  01/06/2015 FINDINGS: Cardiomegaly evident with mild central vascular congestion. No superimposed CHF, edema, collapse, consolidation or pneumonia. No large effusion or pneumothorax. Aorta is atherosclerotic and tortuous. Remote coronary bypass changes. Chronic diffuse thoracic spondylosis. Mild increased thoracic kyphosis. No severe compression fracture deformity. All posterolateral rib fractures. IMPRESSION: Stable cardiomegaly without CHF or pneumonia. No significant interval change or superimposed acute process Thoracic aortic atherosclerosis and tortuosity Electronically Signed   By: Jerilynn Mages.  Shick M.D.   On: 06/06/2016 12:23   Mr Brain Wo Contrast  Result Date: 06/06/2016 CLINICAL DATA:  Dizziness  with fall. EXAM: MRI HEAD WITHOUT CONTRAST TECHNIQUE: Multiplanar, multiecho pulse sequences of the brain and surrounding structures were obtained without intravenous contrast. COMPARISON:  Head CT and CTA 06/02/2016 FINDINGS: Brain: 3 mm acute infarct in the low left parietal cortex. No brainstem or cerebellar infarct or edema to explain history of dizziness. No hemorrhage, hydrocephalus, or mass. There is generalized cerebral volume loss that is mild for age. Mild chronic microvascular ischemic change in the cerebral white matter. Vascular: Normal flow voids. Skull and upper cervical spine: No marrow lesion noted. Sinuses/Orbits: Mild mucosal thickening in the paranasal sinuses. Negative temporal bones. Grossly symmetric labyrinthine signal. Bilateral cataract resection. IMPRESSION: 1. 3 mm acute infarct in the left parietal cortex. 2. No findings in the brainstem or cerebellum to explain dizziness. 3. Age congruent senescent changes. Electronically Signed   By: Monte Fantasia M.D.   On: 06/06/2016 14:54   Mr Cervical Spine Wo Contrast  Result Date: 06/06/2016 CLINICAL DATA:  Dizziness. Fell and hit head. Acute infarct noted on MRI. EXAM: MRI CERVICAL SPINE WITHOUT CONTRAST TECHNIQUE: Multiplanar, multisequence MR imaging of the cervical spine was performed. No intravenous contrast was administered. COMPARISON:  CT scan 06/02/2013 FINDINGS: Alignment: Normal overall alignment. Interbody fusion changes at C5-6 and C6-7. Vertebrae: No acute fracture or bone lesion. No abnormal increased STIR signal intensity in the posterior elements or soft tissues. Cord: Normal signal intensity.  No cord lesions or syrinx. Posterior Fossa, vertebral arteries, paraspinal tissues: No significant findings. Disc levels: C2-3:  No significant findings. C3-4: Broad-based disc protrusion with mass effect on the ventral thecal sac and narrowing of the ventral CSF space. There is also uncinate spurring changes bilaterally, right  greater than left with mild right  foraminal stenosis. C4-5: Disc disease and facet disease. Bulging annulus, osteophytic ridging uncinate spurring with flattening of the ventral thecal sac and narrowing of the ventral CSF space. Mild to moderate bilateral foraminal stenosis. C5-6: Interbody fusion changes. Osteophytic ridging posteriorly to the left with mass effect on the thecal sac. No significant foraminal stenosis. C6-7: Interbody fusion changes. Osteophytic ridging posteriorly with mild impression on the ventral thecal sac. No significant foraminal stenosis. C7-T1:  No significant findings. T1-2: Small left-sided disc osteophyte complex with mild mass effect on the thecal sac and possibly affecting the left T1 nerve root. IMPRESSION: 1. Degenerative cervical spondylosis with multilevel disc disease and facet disease. Interbody fusion changes at C5-6 and C6-7 are noted. 2. Multilevel spinal and foraminal stenosis as described above at the individual levels. 3. No acute bony abnormality or cord injury. Electronically Signed   By: Marijo Sanes M.D.   On: 06/06/2016 15:53    EKG: Independently reviewed.  Assessment/Plan  1. Acute CVA, 29mm acute infarct in the left parietal cortex Neurology consult pending  2. Neck Pain  MRI shows multi level degenerative disc and facet disease with spinal and foraminal stenosis  Neurology to consult   3. Pneumonia, resolving,  Continue doxycycline  4. Atrial Fibrillation Continue home medications, monitor   5. Hypertension       DVT prophylaxis: Coumadin for afib Code Status: Full Family Communication: Wife and daughter at bedside.   Disposition Plan: Discharge to home  Consults called: Dr. Shon Hale Neurology will see Admission status: INpatient   Alyse Low [name] [facility] Pager [phone]  If 7PM-7AM, please contact night-coverage [email or web address] Password [facility]  06/06/2016, 3:53 PM

## 2016-06-06 NOTE — ED Notes (Signed)
Pt transported to MRI on stretcher with tech. 

## 2016-06-07 ENCOUNTER — Other Ambulatory Visit (HOSPITAL_COMMUNITY): Payer: Medicare Other

## 2016-06-07 DIAGNOSIS — I63412 Cerebral infarction due to embolism of left middle cerebral artery: Secondary | ICD-10-CM

## 2016-06-07 LAB — GLUCOSE, CAPILLARY
Glucose-Capillary: 123 mg/dL — ABNORMAL HIGH (ref 65–99)
Glucose-Capillary: 101 mg/dL — ABNORMAL HIGH (ref 65–99)
Glucose-Capillary: 83 mg/dL (ref 65–99)
Glucose-Capillary: 159 mg/dL — ABNORMAL HIGH (ref 65–99)
Glucose-Capillary: 224 mg/dL — ABNORMAL HIGH (ref 65–99)
Glucose-Capillary: 266 mg/dL — ABNORMAL HIGH (ref 65–99)

## 2016-06-07 LAB — PROTIME-INR
INR: 1.9
Prothrombin Time: 22.1 seconds — ABNORMAL HIGH (ref 11.4–15.2)

## 2016-06-07 LAB — LIPID PANEL
Cholesterol: 178 mg/dL (ref 0–200)
HDL: 45 mg/dL (ref >40)
LDL Cholesterol: 106 mg/dL — ABNORMAL HIGH (ref 0–99)
Total CHOL/HDL Ratio: 4 RATIO
Triglycerides: 133 mg/dL (ref <150)
VLDL: 27 mg/dL (ref 0–40)

## 2016-06-07 LAB — VITAMIN B12: Vitamin B-12: 313 pg/mL (ref 180–914)

## 2016-06-07 MED ORDER — APIXABAN 5 MG PO TABS
5.0000 mg | ORAL_TABLET | Freq: Two times a day (BID) | ORAL | Status: DC
  Administered 2016-06-07 – 2016-06-08 (×2): 5 mg via ORAL
  Filled 2016-06-07 (×2): qty 1

## 2016-06-07 MED ORDER — WARFARIN SODIUM 5 MG PO TABS: 10.0000 mg | ORAL_TABLET | Freq: Once | ORAL | Status: DC

## 2016-06-07 MED ORDER — METHOCARBAMOL 500 MG PO TABS
500.0000 mg | ORAL_TABLET | Freq: Three times a day (TID) | ORAL | Status: DC | PRN
  Administered 2016-06-07: 500 mg via ORAL
  Filled 2016-06-07: qty 1

## 2016-06-07 MED ORDER — ACETAMINOPHEN 325 MG PO TABS
650.0000 mg | ORAL_TABLET | ORAL | Status: DC | PRN
  Administered 2016-06-07: 650 mg via ORAL
  Filled 2016-06-07: qty 2

## 2016-06-07 MED ORDER — INSULIN ASPART 100 UNIT/ML ~~LOC~~ SOLN
0.0000 [IU] | Freq: Three times a day (TID) | SUBCUTANEOUS | Status: DC
  Administered 2016-06-07: 3 [IU] via SUBCUTANEOUS
  Administered 2016-06-08: 7 [IU] via SUBCUTANEOUS

## 2016-06-07 NOTE — Progress Notes (Addendum)
[facility] for Coumadin to Eliquis Indication: atrial fibrillation  Labs:  Recent Labs  06/06/16 1143 06/06/16 1144 06/07/16 0523  HGB 12.7*  --   --   HCT 38.3*  --   --   PLT 160  --   --   LABPROT  --  24.9* 22.1*  INR  --  2.21 1.90  CREATININE 1.02  --   --     Estimated Creatinine Clearance: 57.4 mL/min (by C-G formula based on SCr of 1.02 mg/dL).  Assessment: [name] a 83 y.o.malewith PMH atrial fibrillation on warfarin PTA.  He was recently admitted 06/02/16 > 06/03/16 for management of vertigo and was noted to have a subtherapeutic INR.  He was discharged home with Lovenox bridging.  Pt returns to the ED today (4/8) with complaints of dizziness and neck pain.  MRI showed new CVA.  Neuro recommends to continue Coumadin.  INR = 1.9 today  4/9 PM -> to switch to Eliquis  Goal of Therapy:  INR 2-3 Monitor platelets by anticoagulation protocol: Yes   Plan:  Eliquis 5 mg po BID to start tonight Continue to follow  Thank you Anette Guarneri, PharmD [phone] 06/07/2016 11:52 AM

## 2016-06-07 NOTE — Progress Notes (Addendum)
Inpatient Diabetes Program Recommendations  AACE/ADA: New Consensus Statement on Inpatient Glycemic Control (2015)  Target Ranges:  Prepandial:   less than 140 mg/dL      Peak postprandial:   less than 180 mg/dL (1-2 hours)      Critically ill patients:  140 - 180 mg/dL   Results for [name], [name] (MRN [id number]) as of 06/07/2016 10:34  Ref. Range 06/06/2016 21:40 06/07/2016 01:00 06/07/2016 04:11 06/07/2016 08:01  Glucose-Capillary Latest Ref Range: 65 - 99 mg/dL 151 (H) 123 (H) 101 (H) 83   Review of Glycemic Control  Diabetes history: DM2 Outpatient Diabetes medications: Lantus 34 units daily, Jardiance 10 mg daily, Metformin 1000 mg BID Current orders for Inpatient glycemic control: Lantus 34 units QHS  Inpatient Diabetes Program Recommendations: Correction (SSI): Please consider ordering CBGs with Novolog correction scale ACHS. A1C: 9.0% on 06/02/16 indicating an average glucose of 212 mg/dl over the past 2-3 months.  Addendum 06/07/16@13 :45-Talked with patient regarding DM and outpatient DM regimen. Patient states that he is followed by his PCP for DM management. Patient stated that his A1C was 9.8% at his last PCP visit and he was started on Jardiance about 1 months ago. Since starting on Jardiance patient reports that his glucose has improved. Patient checks glucose BID and it has been running less than 200 mg/dl since starting the Jardiance. Patient reports that the Jardiance was expensive but he was able to get it approved by the [place] and he is now getting it for $8. Patient is knowledgeable about DM and is working with PCP to continue to improve DM control. Patient verbalized understanding of information discussed and he has no further questions at this time.  Thanks, Barnie Alderman, RN, MSN, CDE Diabetes Coordinator Inpatient Diabetes Program [phone] (Team Pager from 8am to 5pm)

## 2016-06-07 NOTE — Progress Notes (Signed)
STROKE TEAM PROGRESS NOTE   HISTORY OF PRESENT ILLNESS (per record) This is an 83 year old right-handed man who presented to the emergency room for evaluation of dizziness. History is obtained directly from the patient who is a good historian. His wife and daughter are at the bedside and offer additional information as needed.  The patient reports that he started having some dizziness about 5 days ago. When asked to characterize his dizziness, he describes feeling fuzzy and having difficulty focusing. He notes that his symptoms worsened with head movement. He has a long-standing history of intermittent vertigo and says that this dizziness is different because he did not appreciate any sense of motion with his current episode. He was admitted to [facility] from 4/4-06/03/16 because of these symptoms. During that admission, he was found to have community-acquired pneumonia after CT scan of the neck showed patchy consolidation in the right upper lobe which was accompanied by peripheral leukocytosis. He was placed on doxycycline. He was also noted to be dehydrated with possible orthostatic hypotension and was given IV fluids with improvement in blood pressure. CT angiogram of the head and neck were obtained and did not show any culpable pathology to explain his dizziness. He was noted to have an unremarkable neurologic examination. MRI scan was reportedly obtained at an outside facility and per notes showed no evidence of acute infarct. He was maintained on meclizine and discharged home after reporting improvement in symptoms.  He reports that he actually did well for the past 2 days with minimal dizziness. However, this morning, he got up to go to the bathroom and says that he suddenly felt dizzy again. This time it sounds as if he did have a sense of motion with symptoms more suggestive of vertigo. He's also had some nausea associated with his symptoms. He has some left-sided neck pain which has been present  for the past 5-7 days. He has not appreciated any weakness, numbness, tingling, vision loss, double vision, slurred speech, difficulty swallowing, language disturbances. Today, he has had difficulty walking because he has felt off balance.  He was last known well five days ago, 06/01/2016, time unknown. NIHSS score: 0. mRS score: 0. Patient was not administered IV t-PA secondary to delay in arrival. He was admitted for further evaluation and treatment.   SUBJECTIVE (INTERVAL HISTORY) Wife at bedside. He stated that he had lightheadedness but not vertigo, felt nausea but no vomiting, and off balance at the time of onset. MRI showed punctate left parietal DWI restriction. INR 2.21 today.    OBJECTIVE Temp:  [97.5 F (36.4 C)-98.2 F (36.8 C)] 98.2 F (36.8 C) (04/09 1119) Pulse Rate:  [65-92] 90 (04/09 1119) Cardiac Rhythm: Atrial fibrillation (04/09 0700) Resp:  [15-21] 20 (04/09 1119) BP: (112-153)/(33-92) 153/87 (04/09 1119) SpO2:  [95 %-100 %] 98 % (04/09 1119)  CBC:  Recent Labs Lab 06/02/16 0252 06/03/16 0545 06/06/16 1143  WBC 16.6* 11.9* 8.7  NEUTROABS 13.9*  --  6.3  HGB 12.9* 12.3* 12.7*  HCT 39.7 38.1* 38.3*  MCV 83.4 84.1 83.6  PLT 166 189 948    Basic Metabolic Panel:  Recent Labs Lab 06/03/16 0545 06/06/16 1143  NA 138 141  K 4.1 4.8  CL 106 107  CO2 23 28  GLUCOSE 201* 163*  BUN 21* 14  CREATININE 1.22 1.02  CALCIUM 9.0 9.2    Lipid Panel:    Component Value Date/Time   CHOL 178 06/07/2016 0523   TRIG 133 06/07/2016 0523  HDL 45 06/07/2016 0523   CHOLHDL 4.0 06/07/2016 0523   VLDL 27 06/07/2016 0523   LDLCALC 106 (H) 06/07/2016 0523   HgbA1c:  Lab Results  Component Value Date   HGBA1C 9.0 (H) 06/02/2016   Urine Drug Screen: No results found for: LABOPIA, COCAINSCRNUR, LABBENZ, AMPHETMU, THCU, LABBARB  Alcohol Level No results found for: [place] I have personally reviewed the radiological images below and agree with the radiology  interpretations.  Ct Angio Head W Or Wo Contrast Ct Angio Neck W And/or Wo Contrast 06/02/2016 1. No acute intracranial abnormality identified on noncontrast CT of head. After administration of intravenous contrast there is no abnormal enhancement. 2. Mild chronic microvascular ischemic changes and mild parenchymal volume loss of the brain. 3. Patent carotid and vertebral arteries of the neck without evidence for dissection, aneurysm, or occlusion. 4. Right proximal ICA moderate 60% stenosis. 5. Patent circle of Willis without evidence for proximal occlusion, high-grade stenosis, aneurysm, or vascular malformation. 6. Right upper lobe patchy consolidation likely represents pneumonia.   Mr Brain Wo Contrast 06/06/2016 1. 3 mm acute infarct in the left parietal cortex. 2. No findings in the brainstem or cerebellum to explain dizziness. 3. Age congruent senescent changes.   Mr Cervical Spine Wo Contrast 06/06/2016 1. Degenerative cervical spondylosis with multilevel disc disease and facet disease. Interbody fusion changes at C5-6 and C6-7 are noted. 2. Multilevel spinal and foraminal stenosis as described above at the individual levels. 3. No acute bony abnormality or cord injury.   Dg Chest 2 View 06/06/2016 Stable cardiomegaly without CHF or pneumonia. No significant interval change or superimposed acute process Thoracic aortic atherosclerosis and tortuosity   TTE Pending   PHYSICAL EXAM  Temp:  [97.5 F (36.4 C)-98.7 F (37.1 C)] 98 F (36.7 C) (04/09 2121) Pulse Rate:  [72-112] 112 (04/09 2121) Resp:  [16-20] 20 (04/09 2121) BP: (112-154)/(53-87) 154/80 (04/09 2121) SpO2:  [95 %-98 %] 98 % (04/09 2121)  General - Well nourished, well developed, in no apparent distress.  Ophthalmologic - Fundi not visualized due to eye movement.  Cardiovascular - irregularly irregular heart rate and rhythm.  Mental Status -  Level of arousal and orientation to time, place, and person were  intact. Language including expression, naming, repetition, comprehension was assessed and found intact.  Cranial Nerves II - XII - II - Visual field intact OU. III, IV, VI - Extraocular movements intact. V - Facial sensation intact bilaterally. VII - Facial movement intact bilaterally. VIII - Hearing & vestibular intact bilaterally. X - Palate elevates symmetrically. XI - Chin turning & shoulder shrug intact bilaterally. XII - Tongue protrusion intact.  Motor Strength - The patient's strength was normal in all extremities and pronator drift was absent.  Bulk was normal and fasciculations were absent.   Motor Tone - Muscle tone was assessed at the neck and appendages and was normal.  Reflexes - The patient's reflexes were 1+ in all extremities and he had no pathological reflexes.  Sensory - Light touch, temperature/pinprick were assessed and were symmetrical.    Coordination - The patient had normal movements in the hands and feet with no ataxia or dysmetria.  Tremor was absent.  Gait and Station - deferred.   ASSESSMENT/PLAN [name] is a 83 y.o. male with history of atrial fibrillation on coumadin, CAD, HTN, HLD, DB and recent admission for vertigo presenting with dizziness and neck pain. He did not receive IV t-PA due to delay in arrival.  Stroke:  left parietal punctate infarct (MCA territory), embolic likely secondary to afib with fluctuating INR  Resultant  Slight lightheadedness  CTA head and neck no acute abnormality. R ICA 60% stenosis.   MRI  L parietal punctate infarct  MR CSpine  No acute abnormality. Degenerative spondylosis. Multilevel spine and foraminal stenosis.   2D Echo  pending  LDL 106  HgbA1c 9.0  Warfarin for VTE prophylaxis  Diet Heart Room service appropriate? Yes; Fluid consistency: Thin  warfarin daily prior to admission (had also used lovenox as bridge, INR now therapeutic), now on warfarin daily. Due to INR fluctuation, recommend  switch to eliquis for stroke prevention.   Patient counseled to be compliant with his antithrombotic medications  Ongoing aggressive stroke risk factor management  Therapy recommendations:  HH PT  Disposition:  pending   Atrial Fibrillation  Home anticoagulation:  warfarin daily continued in the hospital  Recent admission for vertigo 4/4-06/03/16 found subtherapeutic INR. Sent home with lovenox bridge  INR 2.21 this admission, now 1.90  Recommend to switch to eliquis for stroke prevention   Carotid stenosis  Right ICA 60% stenosis  VVS Dr. Eden Lathe following with pt  Plan for repeat CUS in 6 months.   Hypertension  Stable  Permissive hypertension (OK if < 180/105) but gradually normalize in 5-7 days  Long-term BP goal normotensive  Hyperlipidemia  Home meds:  zocor 40, resumed in hospital  LDL 106, goal < 70  On lipitor 40mg   Continue statin at discharge  Diabetes type II  HgbA1c 9.0, goal < 7.0  Uncontrolled  Pt understood to follow up with PCP closely for better DM control.  Other Stroke Risk Factors  Advanced age  Former Cigarette smoker  Coronary artery disease s/p CABG and stents  Mild AS  Other Active Problems  PNA one week ago  Hospital day # 1  Neurology will sign off. Please call with questions. No neuro follow up needed at this time. Thanks for the consult.  Rosalin Hawking, MD PhD Stroke Neurology 06/07/2016 10:35 PM   To contact Stroke Continuity provider, please refer to [email or web address] After hours, contact General Neurology

## 2016-06-07 NOTE — Evaluation (Signed)
Physical Therapy Evaluation Patient Details Name: [name] MRN: [id number] DOB: Dec 02, 1928 Today's Date: 06/07/2016   History of Present Illness  Patient is an 83 y/o male with hypertension, hyperlipidemia, diabetes, CAD with atrial fibrillation on Coumadin. Patient presented  after falling at home.  Has had increasing vertigo since the beginning of the month, but has had worsening vertigo since his fall previous to this admission.  Pt had MRI of Brain which shows 3 mm acute infarct in the low left parietal cortex. MRI  of c spine shows c3-c4 Broad based disc protrusion.  Clinical Impression  Patient presents with decreased independence with mobility due to deficits listed in PT problem list.  Patient with continued light headedness though reports much improved from admission.  Continue to feel he should be safe for d/c home with family support, but would need further assessment for sure as pt guarded today and limited ambulation to in room.  Will follow acutely and further assess safety at next session.     Follow Up Recommendations Home health PT;Supervision/Assistance - 24 hour    Equipment Recommendations  None recommended by PT    Recommendations for Other Services       Precautions / Restrictions Precautions Precautions: Fall      Mobility  Bed Mobility               General bed mobility comments: up in bathroom upon my entry  Transfers Overall transfer level: Needs assistance Equipment used: Rolling walker (2 wheeled) Transfers: Sit to/from Stand Sit to Stand: Min assist         General transfer comment: for safety up off 3:1 cues for hand placement  Ambulation/Gait Ambulation/Gait assistance: Min guard Ambulation Distance (Feet): 35 Feet Assistive device: Rolling walker (2 wheeled) Gait Pattern/deviations: Step-through pattern;Trunk flexed;Decreased stride length     General Gait Details: guarded and mild c/o dizziness (light headedness,) but much  better now than previously.  STill no "vertigo" symptoms of spinning, but feels light headed  Financial trader Rankin (Stroke Patients Only) Modified Rankin (Stroke Patients Only) Pre-Morbid Rankin Score: Moderate disability Modified Rankin: Moderately severe disability     Balance Overall balance assessment: Needs assistance Sitting-balance support: Feet supported Sitting balance-Leahy Scale: Good     Standing balance support: Bilateral upper extremity supported Standing balance-Leahy Scale: Poor Standing balance comment: Reliant on UE support this session                             Pertinent Vitals/Pain Pain Assessment: 0-10 Pain Score: 7  Pain Location: left neck with turning L Pain Descriptors / Indicators: Aching;Grimacing;Guarding Pain Intervention(s): Monitored during session;Heat applied    Home Living Family/patient expects to be discharged to:: Private residence Living Arrangements: Spouse/significant other Available Help at Discharge: Family;Available 24 hours/day Type of Home: House Home Access: Stairs to enter Entrance Stairs-Rails: None   Home Layout: One level Home Equipment: Bedside commode;Shower seat - built in;Walker - standard      Prior Function Level of Independence: Needs assistance   Gait / Transfers Assistance Needed: used RW last few days but prior to that no device           Hand Dominance        Extremity/Trunk Assessment        Lower Extremity Assessment Lower Extremity Assessment: Overall WFL for tasks assessed  Cervical / Trunk Assessment Cervical / Trunk Assessment: Kyphotic;Other exceptions Cervical / Trunk Exceptions: forward head, restricted AROM rotation to L > R with increased pain to L; WFL flex/ext no increased pain  Communication   Communication: No difficulties  Cognition Arousal/Alertness: Awake/alert Behavior During Therapy: WFL for tasks  assessed/performed Overall Cognitive Status: Within Functional Limits for tasks assessed                                        General Comments      Exercises     Assessment/Plan    PT Assessment Patient needs continued PT services  PT Problem List Decreased strength;Decreased mobility;Decreased balance;Decreased knowledge of use of DME;Decreased activity tolerance       PT Treatment Interventions DME instruction;Gait training;Functional mobility training;Therapeutic activities;Therapeutic exercise;Balance training;Patient/family education    PT Goals (Current goals can be found in the Care Plan section)  Acute Rehab PT Goals Patient Stated Goal: to go home PT Goal Formulation: With patient Time For Goal Achievement: 06/21/16 Potential to Achieve Goals: Good    Frequency Min 4X/week   Barriers to discharge        Co-evaluation               End of Session Equipment Utilized During Treatment: Gait belt Activity Tolerance: Patient tolerated treatment well Patient left: in chair;with call bell/phone within reach;with family/visitor present   PT Visit Diagnosis: Other abnormalities of gait and mobility (R26.89);Muscle weakness (generalized) (M62.81)    Time: 5176-1607 PT Time Calculation (min) (ACUTE ONLY): 19 min   Charges:   PT Evaluation $PT Eval Moderate Complexity: 1 Procedure     PT G CodesMagda Kiel, [place] 06/07/2016   Reginia Naas 06/07/2016, 12:27 PM

## 2016-06-07 NOTE — Progress Notes (Signed)
[facility]  Patient Status: Active (receiving services up to time of hospitalization)  [facility] is providing the following services: RN, PT and OT  If patient discharges after hours, please call [phone].   [name] 06/07/2016, 10:50 AM

## 2016-06-07 NOTE — Evaluation (Signed)
Occupational Therapy Evaluation Patient Details Name: [name] MRN: [id number] DOB: 09/26/1928 Today's Date: 06/07/2016    History of Present Illness Patient is an 83 y/o male with hypertension, hyperlipidemia, diabetes, CAD with atrial fibrillation on Coumadin. Patient presented  after falling at home.  Has had increasing vertigo since the beginning of the month, but has had worsening vertigo since his fall previous to this admission.  Pt had MRI of Brain which shows 3 mm acute infarct in the low left parietal cortex. MRI  of c spine shows c3-c4 Broad based disc protrusion.   Clinical Impression   Pt admitted with above. He demonstrates the below listed deficits and will benefit from continued OT to maximize safety and independence with BADLs.  Pt presents to OT with mild Rt shoulder weakness, decreased activity tolerance, and impaired balance.  He requires min guard assist for ADLs and wife very supportive.  Recommend HHOT at discharge.  Will follow.       Follow Up Recommendations  Home health OT;Supervision/Assistance - 24 hour    Equipment Recommendations  None recommended by OT    Recommendations for Other Services       Precautions / Restrictions Precautions Precautions: Fall Restrictions Weight Bearing Restrictions: No      Mobility Bed Mobility               General bed mobility comments: Pt up in recliner   Transfers Overall transfer level: Needs assistance Equipment used: Rolling walker (2 wheeled) Transfers: Sit to/from [facility] Sit to Stand: Min guard Stand pivot transfers: Min guard       General transfer comment: min guard for safety     Balance Overall balance assessment: Needs assistance Sitting-balance support: Feet supported Sitting balance-Leahy Scale: Good     Standing balance support: Bilateral upper extremity supported Standing balance-Leahy Scale: Poor Standing balance comment: Reliant on UE support this  session                           ADL either performed or assessed with clinical judgement   ADL Overall ADL's : Needs assistance/impaired Eating/Feeding: Independent   Grooming: Wash/dry hands;Wash/dry face;Oral care;Brushing hair;Min guard;Standing   Upper Body Bathing: Set up;Sitting   Lower Body Bathing: Min guard;Sit to/from stand   Upper Body Dressing : Supervision/safety;Set up;Sitting   Lower Body Dressing: Min guard;Sit to/from stand Lower Body Dressing Details (indicate cue type and reason): Pt is able to don/doff socks  Toilet Transfer: Min guard;Ambulation;Comfort height toilet;Grab bars;RW   Toileting- Water quality scientist and Hygiene: Min guard;Sit to/from stand       Functional mobility during ADLs: Min guard;Rolling walker       Vision Baseline Vision/History: Wears glasses Wears Glasses: At all times Patient Visual Report: No change from baseline Vision Assessment?: Yes Eye Alignment: Within Functional Limits Ocular Range of Motion: Within Functional Limits Alignment/Gaze Preference: Within Defined Limits Tracking/Visual Pursuits: Able to track stimulus in all quads without difficulty Visual Fields: No apparent deficits     Perception Perception Perception Tested?: Yes   Praxis Praxis Praxis tested?: Within functional limits    Pertinent Vitals/Pain Pain Assessment: Faces Faces Pain Scale: No hurt     Hand Dominance Right   Extremity/Trunk Assessment Upper Extremity Assessment Upper Extremity Assessment: RUE deficits/detail RUE Deficits / Details: shoulder 4-/5   Lower Extremity Assessment Lower Extremity Assessment: Defer to PT evaluation   Cervical / Trunk Assessment Cervical / Trunk Assessment: Kyphotic;Other exceptions  Cervical / Trunk Exceptions: forward head    Communication Communication Communication: No difficulties   Cognition Arousal/Alertness: Awake/alert Behavior During Therapy: WFL for tasks  assessed/performed Overall Cognitive Status: Within Functional Limits for tasks assessed                                     General Comments  wife present throughout session.  Pt reports he does not plan to drive, and they have arranged for grandson to assist them with this     Exercises     Shoulder Instructions      Home Living Family/patient expects to be discharged to:: Private residence Living Arrangements: Spouse/significant other Available Help at Discharge: Family;Available 24 hours/day Type of Home: House Home Access: Stairs to enter [employer] of Steps: 1 Entrance Stairs-Rails: None Home Layout: One level     Bathroom Shower/Tub: Occupational psychologist: Handicapped height Bathroom Accessibility: Yes How Accessible: Accessible via walker Home Equipment: Bedside commode;Shower seat - built in;Walker - standard   Additional Comments: wife able to assist pt       Prior Functioning/Environment Level of Independence: Needs assistance  Gait / Transfers Assistance Needed: used RW last few days but prior to that no device ADL's / Homemaking Assistance Needed: Wife assists pt with socks, and he assists wife with support stockings.  Pt reports he was driving PTA             OT Problem List: Decreased strength;Decreased activity tolerance;Impaired balance (sitting and/or standing);Decreased knowledge of use of DME or AE      OT Treatment/Interventions: Self-care/ADL training;Neuromuscular education;Therapeutic activities;Patient/family education;Balance training    OT Goals(Current goals can be found in the care plan section) Acute Rehab OT Goals Patient Stated Goal: to go home OT Goal Formulation: With patient/family Time For Goal Achievement: 06/21/16 Potential to Achieve Goals: Good ADL Goals Pt Will Perform Grooming: with modified independence;standing Pt Will Perform Upper Body Bathing: with modified  independence;sitting Pt Will Perform Lower Body Bathing: with modified independence;with adaptive equipment;sit to/from stand Pt Will Perform Upper Body Dressing: with modified independence;sitting Pt Will Perform Lower Body Dressing: with modified independence;sit to/from stand Pt Will Transfer to Toilet: with modified independence;ambulating;regular height toilet;bedside commode;grab bars Pt Will Perform Toileting - Clothing Manipulation and hygiene: with modified independence;sit to/from stand Pt Will Perform Tub/Shower Transfer: Shower transfer;with supervision;ambulating;shower seat;rolling walker Pt/caregiver will Perform Home Exercise Program: Increased strength;Right Upper extremity;With theraband;Independently;With written HEP provided  OT Frequency: Min 2X/week   Barriers to D/C:            Co-evaluation              End of Session Equipment Utilized During Treatment: Rolling walker;Gait belt Nurse Communication: Mobility status  Activity Tolerance: Patient tolerated treatment well Patient left: in chair;with call bell/phone within reach;with chair alarm set;with family/visitor present  OT Visit Diagnosis: Unsteadiness on feet (R26.81)                Time: 6808-8110 OT Time Calculation (min): 40 min Charges:  OT General Charges $OT Visit: 1 Procedure OT Evaluation $OT Eval Low Complexity: 1 Procedure OT Treatments $Therapeutic Activity: 23-37 mins G-Codes:     [facility], OTR/L [phone]   Lucille Passy M 06/07/2016, 10:38 PM

## 2016-06-07 NOTE — Progress Notes (Signed)
[facility] PROGRESS NOTE  [name] FUX:[id number] DOB: [birth year] DOA: 06/06/2016   PCP: [name], MD     Assessment/Plan: Active Problems:   CVA (cerebral vascular accident) ([place])   A 8 row male with hypertension, hyperlipidemia, diabetes, CAD with atrial fibrillation on Coumadin. Patient presents to the emergency department after falling this morning at 2:30. Has had increasing vertigo since the beginning of the month, but has had worsening vertigo since his fall this morning. The vertigo is constant and is independent of movement. BP (!) 121/92   Pulse 85   Temp 97.9 F (36.6 C)   Resp 19   SpO2 96%  awake and oriented 3, no acute distress. Regular rate with normal S1 S2. Clear to auscultation bilaterally. Abdomen soft nontender. No focal muscular neurological deficits. Neurology consulted. MRI shows acute infarct of left parietal cortex.    Assessment and plan Acute ischemic stroke 2-D echo pending CT angiogram of the head and neck were just obtained a few days ago and showed 60% stenosis of the right internal carotid artery, no other significant pathology. Hemoglobin A1c was 9.0 on 06/02/16.  Continue warfarin for secondary stroke prevention. He is presently therapeutic with INR 2.2. Continue statin with goal LDL less than 70. Ensure adequate glucose control. Allow permissive hypertension in the acute phase, treating only SBP greater than 220 mmHg and/or DBP greater than 110 mmHg. Avoid fever and hyperglycemia as these are associated with worse neurological outcomes. Initiate rehab services Hemoglobin A1c 9.0 LDL 106 May need to change Coumadin to eliquis as per neurology  c spine spondylosis and arthritis  Start robaxin and tylenol  Recent community-acquired pneumonia Discharged on doxycycline Repeat chest x-ray stable  Atrial fibrillation , chronic -CHADSVASC 5 (based on age, CAD, DM, HTN) -INR 1.8 >1.9, may need to change Coumadin to eliquis   -Continue cardizem and metoprolol    Diabetes mellitus, type II Continue Lantus, started on SSI  Hypertension  -Continue metoprolol, cardizem -Follow up with PCP  -lisinopril held as patient was orthostatic  CAD  -S/p CABG, DES of RCA 2016.  -Curently no chest pain -Continue statin, coumadin, metoprolol  BPH -Continue home meds  Ataxia -Newly diagnosed per patient. Recent MRI without acute infarct.  -He was prescribed valium per PCP recently  -continue PT  Right Carotid stenosis -Per CTA 60% -Vascular surgery consulted and appreciated, recommended carotid Doppler in 6 months    DVT prophylaxsis  Coumadin  Code Status:  Full code    Family Communication: Discussed in detail with the patient, all imaging results, lab results explained to the patient   Disposition-1-2 days    Consultants:  Neurology  Procedures:  None  Antibiotics: Anti-infectives    Start     Dose/Rate Route Frequency Ordered Stop   06/06/16 2200  doxycycline (VIBRA-TABS) tablet 100 mg     100 mg Oral Every 12 hours 06/06/16 1700           HPI/Subjective: Pain in his neck   Objective: Vitals:   06/07/16 0300 06/07/16 0500 06/07/16 0644 06/07/16 1119  BP: (!) 146/76 139/76 (!) 150/84 (!) 153/87  Pulse: 84 80 92 90  Resp: 20 18 18 20   Temp:   97.5 F (36.4 C) 98.2 F (36.8 C)  TempSrc:   Oral Oral  SpO2: 97% 95% 98% 98%    Intake/Output Summary (Last 24 hours) at 06/07/16 1249 Last data filed at 06/07/16 1009  Gross per 24 hour  Intake  120 ml  Output             1100 ml  Net             -980 ml    Exam:  Examination:  General exam: Appears calm and comfortable  Respiratory system: Clear to auscultation. Respiratory effort normal. Cardiovascular system: S1 & S2 heard, RRR. No JVD, murmurs, rubs, gallops or clicks. No pedal edema. Gastrointestinal system: Abdomen is nondistended, soft and nontender. No organomegaly or masses felt. Normal bowel  sounds heard. Central nervous system: Alert and oriented. No focal neurological deficits. Extremities: Symmetric 5 x 5 power. Skin: No rashes, lesions or ulcers Psychiatry: Judgement and insight appear normal. Mood & affect appropriate.     Data Reviewed: I have personally reviewed following labs and imaging studies  Micro Results Recent Results (from the past 240 hour(s))  Blood culture (routine x 2)     Status: None (Preliminary result)   Collection Time: 06/02/16  6:05 AM  Result Value Ref Range Status   Specimen Description BLOOD RIGHT ARM  Final   Special Requests   Final    BOTTLES DRAWN AEROBIC AND ANAEROBIC Blood Culture adequate volume   Culture NO GROWTH 4 DAYS  Final   Report Status PENDING  Incomplete  Blood culture (routine x 2)     Status: None (Preliminary result)   Collection Time: 06/02/16  6:15 AM  Result Value Ref Range Status   Specimen Description BLOOD LEFT HAND  Final   Special Requests   Final    BOTTLES DRAWN AEROBIC AND ANAEROBIC Blood Culture adequate volume   Culture NO GROWTH 4 DAYS  Final   Report Status PENDING  Incomplete    Radiology Reports Ct Angio Head W Or Wo Contrast  Result Date: 06/02/2016 CLINICAL DATA:  83 y/o  M; dizziness and neck pain. EXAM: CT ANGIOGRAPHY HEAD AND NECK TECHNIQUE: Multidetector CT imaging of the head and neck was performed using the standard protocol during bolus administration of intravenous contrast. Multiplanar CT image reconstructions and MIPs were obtained to evaluate the vascular anatomy. Carotid stenosis measurements (when applicable) are obtained utilizing NASCET criteria, using the distal internal carotid diameter as the denominator. CONTRAST:  50 cc Isovue 370 COMPARISON:  None. FINDINGS: CT HEAD FINDINGS Brain: No evidence of acute infarction, hemorrhage, hydrocephalus, extra-axial collection or mass lesion/mass effect. Mild chronic microvascular ischemic changes and parenchymal volume loss of the brain.  Vascular: As below. Skull: Normal. Negative for fracture or focal lesion. Sinuses: Imaged portions are clear. Orbits: Bilateral intra-ocular lens replacement. Review of the MIP images confirms the above findings CTA NECK FINDINGS Aortic arch: 4 vessel arch. Moderate calcific atherosclerosis. No significant stenosis of great vessel origins. Right carotid system: No evidence of dissection or occlusion. Dense calcific atherosclerosis of the carotid bifurcation and proximal ICA with moderate 60% stenosis of proximal ICA. Hairpin turn of upper cervical segment of ICA. Left carotid system: No evidence of dissection, stenosis (50% or greater) or occlusion. Dense calcific atherosclerosis of the carotid bifurcation without high-grade stenosis. Hairpin turn of upper cervical segment of ICA. Vertebral arteries: Codominant. No evidence of dissection, stenosis (50% or greater) or occlusion. Skeleton: Advanced cervical spondylosis with interbody fusion of the C5 through C7 vertebral bodies, ossification of posterior longitudinal ligament, and mild to moderate bony canal stenosis through those levels. No acute osseous abnormality is evident. Partially visualize median sternotomy postsurgical changes. Other neck: Negative. Upper chest: Right upper lobe patchy consolidation likely represents pneumonia. Review of  the MIP images confirms the above findings CTA HEAD FINDINGS Anterior circulation: No significant stenosis, proximal occlusion, aneurysm, or vascular malformation. Extensive calcific atherosclerosis of cavernous and paraclinoid internal carotid arteries without significant stenosis. Posterior circulation: Right V4 segment calcified plaque with mild stenosis. Small caliber basilar artery is likely due to variant anatomy of bilateral fetal PCA. Venous sinuses: As permitted by contrast timing, patent. Anatomic variants: Bilateral fetal posterior cerebral arteries and small anterior communicating artery. Delayed phase: No  abnormal intracranial enhancement. Review of the MIP images confirms the above findings IMPRESSION: 1. No acute intracranial abnormality identified on noncontrast CT of head. After administration of intravenous contrast there is no abnormal enhancement. 2. Mild chronic microvascular ischemic changes and mild parenchymal volume loss of the brain. 3. Patent carotid and vertebral arteries of the neck without evidence for dissection, aneurysm, or occlusion. 4. Right proximal ICA moderate 60% stenosis. 5. Patent circle of Willis without evidence for proximal occlusion, high-grade stenosis, aneurysm, or vascular malformation. 6. Right upper lobe patchy consolidation likely represents pneumonia. These results were called by telephone at the time of interpretation on 06/02/2016 at 5:16 am to Dr. Thayer Jew , who verbally acknowledged these results. Electronically Signed   By: Kristine Garbe M.D.   On: 06/02/2016 05:18   Dg Chest 2 View  Result Date: 06/06/2016 CLINICAL DATA:  Dizziness, recent fall, shortness of breath, chest pain and cough EXAM: CHEST  2 VIEW COMPARISON:  01/06/2015 FINDINGS: Cardiomegaly evident with mild central vascular congestion. No superimposed CHF, edema, collapse, consolidation or pneumonia. No large effusion or pneumothorax. Aorta is atherosclerotic and tortuous. Remote coronary bypass changes. Chronic diffuse thoracic spondylosis. Mild increased thoracic kyphosis. No severe compression fracture deformity. All posterolateral rib fractures. IMPRESSION: Stable cardiomegaly without CHF or pneumonia. No significant interval change or superimposed acute process Thoracic aortic atherosclerosis and tortuosity Electronically Signed   By: Jerilynn Mages.  Shick M.D.   On: 06/06/2016 12:23   Ct Angio Neck W And/or Wo Contrast  Result Date: 06/02/2016 CLINICAL DATA:  83 y/o  M; dizziness and neck pain. EXAM: CT ANGIOGRAPHY HEAD AND NECK TECHNIQUE: Multidetector CT imaging of the head and neck was  performed using the standard protocol during bolus administration of intravenous contrast. Multiplanar CT image reconstructions and MIPs were obtained to evaluate the vascular anatomy. Carotid stenosis measurements (when applicable) are obtained utilizing NASCET criteria, using the distal internal carotid diameter as the denominator. CONTRAST:  50 cc Isovue 370 COMPARISON:  None. FINDINGS: CT HEAD FINDINGS Brain: No evidence of acute infarction, hemorrhage, hydrocephalus, extra-axial collection or mass lesion/mass effect. Mild chronic microvascular ischemic changes and parenchymal volume loss of the brain. Vascular: As below. Skull: Normal. Negative for fracture or focal lesion. Sinuses: Imaged portions are clear. Orbits: Bilateral intra-ocular lens replacement. Review of the MIP images confirms the above findings CTA NECK FINDINGS Aortic arch: 4 vessel arch. Moderate calcific atherosclerosis. No significant stenosis of great vessel origins. Right carotid system: No evidence of dissection or occlusion. Dense calcific atherosclerosis of the carotid bifurcation and proximal ICA with moderate 60% stenosis of proximal ICA. Hairpin turn of upper cervical segment of ICA. Left carotid system: No evidence of dissection, stenosis (50% or greater) or occlusion. Dense calcific atherosclerosis of the carotid bifurcation without high-grade stenosis. Hairpin turn of upper cervical segment of ICA. Vertebral arteries: Codominant. No evidence of dissection, stenosis (50% or greater) or occlusion. Skeleton: Advanced cervical spondylosis with interbody fusion of the C5 through C7 vertebral bodies, ossification of posterior longitudinal ligament, and mild  to moderate bony canal stenosis through those levels. No acute osseous abnormality is evident. Partially visualize median sternotomy postsurgical changes. Other neck: Negative. Upper chest: Right upper lobe patchy consolidation likely represents pneumonia. Review of the MIP images  confirms the above findings CTA HEAD FINDINGS Anterior circulation: No significant stenosis, proximal occlusion, aneurysm, or vascular malformation. Extensive calcific atherosclerosis of cavernous and paraclinoid internal carotid arteries without significant stenosis. Posterior circulation: Right V4 segment calcified plaque with mild stenosis. Small caliber basilar artery is likely due to variant anatomy of bilateral fetal PCA. Venous sinuses: As permitted by contrast timing, patent. Anatomic variants: Bilateral fetal posterior cerebral arteries and small anterior communicating artery. Delayed phase: No abnormal intracranial enhancement. Review of the MIP images confirms the above findings IMPRESSION: 1. No acute intracranial abnormality identified on noncontrast CT of head. After administration of intravenous contrast there is no abnormal enhancement. 2. Mild chronic microvascular ischemic changes and mild parenchymal volume loss of the brain. 3. Patent carotid and vertebral arteries of the neck without evidence for dissection, aneurysm, or occlusion. 4. Right proximal ICA moderate 60% stenosis. 5. Patent circle of Willis without evidence for proximal occlusion, high-grade stenosis, aneurysm, or vascular malformation. 6. Right upper lobe patchy consolidation likely represents pneumonia. These results were called by telephone at the time of interpretation on 06/02/2016 at 5:16 am to Dr. Thayer Jew , who verbally acknowledged these results. Electronically Signed   By: Kristine Garbe M.D.   On: 06/02/2016 05:18   Mr Brain Wo Contrast  Result Date: 06/06/2016 CLINICAL DATA:  Dizziness with fall. EXAM: MRI HEAD WITHOUT CONTRAST TECHNIQUE: Multiplanar, multiecho pulse sequences of the brain and surrounding structures were obtained without intravenous contrast. COMPARISON:  Head CT and CTA 06/02/2016 FINDINGS: Brain: 3 mm acute infarct in the low left parietal cortex. No brainstem or cerebellar infarct or  edema to explain history of dizziness. No hemorrhage, hydrocephalus, or mass. There is generalized cerebral volume loss that is mild for age. Mild chronic microvascular ischemic change in the cerebral white matter. Vascular: Normal flow voids. Skull and upper cervical spine: No marrow lesion noted. Sinuses/Orbits: Mild mucosal thickening in the paranasal sinuses. Negative temporal bones. Grossly symmetric labyrinthine signal. Bilateral cataract resection. IMPRESSION: 1. 3 mm acute infarct in the left parietal cortex. 2. No findings in the brainstem or cerebellum to explain dizziness. 3. Age congruent senescent changes. Electronically Signed   By: Monte Fantasia M.D.   On: 06/06/2016 14:54   Mr Cervical Spine Wo Contrast  Result Date: 06/06/2016 CLINICAL DATA:  Dizziness. Fell and hit head. Acute infarct noted on MRI. EXAM: MRI CERVICAL SPINE WITHOUT CONTRAST TECHNIQUE: Multiplanar, multisequence MR imaging of the cervical spine was performed. No intravenous contrast was administered. COMPARISON:  CT scan 06/02/2013 FINDINGS: Alignment: Normal overall alignment. Interbody fusion changes at C5-6 and C6-7. Vertebrae: No acute fracture or bone lesion. No abnormal increased STIR signal intensity in the posterior elements or soft tissues. Cord: Normal signal intensity.  No cord lesions or syrinx. Posterior Fossa, vertebral arteries, paraspinal tissues: No significant findings. Disc levels: C2-3:  No significant findings. C3-4: Broad-based disc protrusion with mass effect on the ventral thecal sac and narrowing of the ventral CSF space. There is also uncinate spurring changes bilaterally, right greater than left with mild right foraminal stenosis. C4-5: Disc disease and facet disease. Bulging annulus, osteophytic ridging uncinate spurring with flattening of the ventral thecal sac and narrowing of the ventral CSF space. Mild to moderate bilateral foraminal stenosis. C5-6: Interbody fusion changes.  Osteophytic ridging  posteriorly to the left with mass effect on the thecal sac. No significant foraminal stenosis. C6-7: Interbody fusion changes. Osteophytic ridging posteriorly with mild impression on the ventral thecal sac. No significant foraminal stenosis. C7-T1:  No significant findings. T1-2: Small left-sided disc osteophyte complex with mild mass effect on the thecal sac and possibly affecting the left T1 nerve root. IMPRESSION: 1. Degenerative cervical spondylosis with multilevel disc disease and facet disease. Interbody fusion changes at C5-6 and C6-7 are noted. 2. Multilevel spinal and foraminal stenosis as described above at the individual levels. 3. No acute bony abnormality or cord injury. Electronically Signed   By: Marijo Sanes M.D.   On: 06/06/2016 15:53     CBC  Recent Labs Lab 06/02/16 0252 06/03/16 0545 06/06/16 1143  WBC 16.6* 11.9* 8.7  HGB 12.9* 12.3* 12.7*  HCT 39.7 38.1* 38.3*  PLT 166 189 160  MCV 83.4 84.1 83.6  MCH 27.1 27.2 27.7  MCHC 32.5 32.3 33.2  RDW 14.4 14.6 15.1  LYMPHSABS 1.3  --  1.5  MONOABS 1.4*  --  0.7  EOSABS 0.1  --  0.2  BASOSABS 0.0  --  0.0    Chemistries   Recent Labs Lab 06/02/16 0252 06/03/16 0545 06/06/16 1143  NA 138 138 141  K 4.0 4.1 4.8  CL 103 106 107  CO2 25 23 28   GLUCOSE 165* 201* 163*  BUN 17 21* 14  CREATININE 1.10 1.22 1.02  CALCIUM 9.3 9.0 9.2  AST  --   --  36  ALT  --   --  20  ALKPHOS  --   --  54  BILITOT  --   --  1.0   ------------------------------------------------------------------------------------------------------------------ estimated creatinine clearance is 57.4 mL/min (by C-G formula based on SCr of 1.02 mg/dL). ------------------------------------------------------------------------------------------------------------------ No results for input(s): HGBA1C in the last 72 hours. ------------------------------------------------------------------------------------------------------------------  Recent Labs   06/07/16 0523  CHOL 178  HDL 45  LDLCALC 106*  TRIG 133  CHOLHDL 4.0   ------------------------------------------------------------------------------------------------------------------ No results for input(s): TSH, T4TOTAL, T3FREE, THYROIDAB in the last 72 hours.  Invalid input(s): FREET3 ------------------------------------------------------------------------------------------------------------------  Recent Labs  06/07/16 0523  VITAMINB12 313    Coagulation profile  Recent Labs Lab 06/02/16 0252 06/02/16 0850 06/03/16 0846 06/06/16 1144 06/07/16 0523  INR 1.57 1.55 1.80 2.21 1.90    No results for input(s): DDIMER in the last 72 hours.  Cardiac Enzymes No results for input(s): CKMB, TROPONINI, MYOGLOBIN in the last 168 hours.  Invalid input(s): CK ------------------------------------------------------------------------------------------------------------------ Invalid input(s): POCBNP   CBG:  Recent Labs Lab 06/06/16 2140 06/07/16 0100 06/07/16 0411 06/07/16 0801 06/07/16 1157  GLUCAP 151* 123* 101* 83 159*       Studies: Dg Chest 2 View  Result Date: 06/06/2016 CLINICAL DATA:  Dizziness, recent fall, shortness of breath, chest pain and cough EXAM: CHEST  2 VIEW COMPARISON:  01/06/2015 FINDINGS: Cardiomegaly evident with mild central vascular congestion. No superimposed CHF, edema, collapse, consolidation or pneumonia. No large effusion or pneumothorax. Aorta is atherosclerotic and tortuous. Remote coronary bypass changes. Chronic diffuse thoracic spondylosis. Mild increased thoracic kyphosis. No severe compression fracture deformity. All posterolateral rib fractures. IMPRESSION: Stable cardiomegaly without CHF or pneumonia. No significant interval change or superimposed acute process Thoracic aortic atherosclerosis and tortuosity Electronically Signed   By: Jerilynn Mages.  Shick M.D.   On: 06/06/2016 12:23   Mr Brain Wo Contrast  Result Date: 06/06/2016 CLINICAL  DATA:  Dizziness with fall. EXAM: MRI HEAD WITHOUT  CONTRAST TECHNIQUE: Multiplanar, multiecho pulse sequences of the brain and surrounding structures were obtained without intravenous contrast. COMPARISON:  Head CT and CTA 06/02/2016 FINDINGS: Brain: 3 mm acute infarct in the low left parietal cortex. No brainstem or cerebellar infarct or edema to explain history of dizziness. No hemorrhage, hydrocephalus, or mass. There is generalized cerebral volume loss that is mild for age. Mild chronic microvascular ischemic change in the cerebral white matter. Vascular: Normal flow voids. Skull and upper cervical spine: No marrow lesion noted. Sinuses/Orbits: Mild mucosal thickening in the paranasal sinuses. Negative temporal bones. Grossly symmetric labyrinthine signal. Bilateral cataract resection. IMPRESSION: 1. 3 mm acute infarct in the left parietal cortex. 2. No findings in the brainstem or cerebellum to explain dizziness. 3. Age congruent senescent changes. Electronically Signed   By: Monte Fantasia M.D.   On: 06/06/2016 14:54   Mr Cervical Spine Wo Contrast  Result Date: 06/06/2016 CLINICAL DATA:  Dizziness. Fell and hit head. Acute infarct noted on MRI. EXAM: MRI CERVICAL SPINE WITHOUT CONTRAST TECHNIQUE: Multiplanar, multisequence MR imaging of the cervical spine was performed. No intravenous contrast was administered. COMPARISON:  CT scan 06/02/2013 FINDINGS: Alignment: Normal overall alignment. Interbody fusion changes at C5-6 and C6-7. Vertebrae: No acute fracture or bone lesion. No abnormal increased STIR signal intensity in the posterior elements or soft tissues. Cord: Normal signal intensity.  No cord lesions or syrinx. Posterior Fossa, vertebral arteries, paraspinal tissues: No significant findings. Disc levels: C2-3:  No significant findings. C3-4: Broad-based disc protrusion with mass effect on the ventral thecal sac and narrowing of the ventral CSF space. There is also uncinate spurring changes  bilaterally, right greater than left with mild right foraminal stenosis. C4-5: Disc disease and facet disease. Bulging annulus, osteophytic ridging uncinate spurring with flattening of the ventral thecal sac and narrowing of the ventral CSF space. Mild to moderate bilateral foraminal stenosis. C5-6: Interbody fusion changes. Osteophytic ridging posteriorly to the left with mass effect on the thecal sac. No significant foraminal stenosis. C6-7: Interbody fusion changes. Osteophytic ridging posteriorly with mild impression on the ventral thecal sac. No significant foraminal stenosis. C7-T1:  No significant findings. T1-2: Small left-sided disc osteophyte complex with mild mass effect on the thecal sac and possibly affecting the left T1 nerve root. IMPRESSION: 1. Degenerative cervical spondylosis with multilevel disc disease and facet disease. Interbody fusion changes at C5-6 and C6-7 are noted. 2. Multilevel spinal and foraminal stenosis as described above at the individual levels. 3. No acute bony abnormality or cord injury. Electronically Signed   By: Marijo Sanes M.D.   On: 06/06/2016 15:53      Lab Results  Component Value Date   HGBA1C 9.0 (H) 06/02/2016   HGBA1C 8.2 (H) 11/28/2010   HGBA1C (H) 11/03/2009    7.2 (NOTE)                                                                       According to the ADA Clinical Practice Recommendations for 2011, when HbA1c is used as a screening test:   >=6.5%   Diagnostic of Diabetes Mellitus           (if abnormal result  is confirmed)  5.7-6.4%   Increased risk of developing Diabetes  [place]  References:Diagnosis and Classification of Diabetes Mellitus,Diabetes EQAS,3419,62(IWLNL 1):S62-S69 and Standards of Medical Care in         Diabetes - 2011,Diabetes [id number],74  (Suppl 1):S11-S61.   Lab Results  Component Value Date   LDLCALC 106 (H) 06/07/2016   CREATININE 1.02 06/06/2016       Scheduled Meds: . atorvastatin  40 mg Oral q1800  .  doxycycline  100 mg Oral Q12H  . insulin aspart  0-9 Units Subcutaneous TID WC  . insulin glargine  34 Units Subcutaneous QHS  . pantoprazole  40 mg Oral Daily  . tamsulosin  0.4 mg Oral QODAY  . warfarin  10 mg Oral ONCE-1800  . Warfarin - Pharmacist Dosing Inpatient   Does not apply q1800   Continuous Infusions:   LOS: 1 day    Time spent: >30 MINS    Reyne Dumas  [facility] Pager [phone]. If 7PM-7AM, please contact night-coverage at [email or web address], password [facility] 06/07/2016, 12:49 PM  LOS: 1 day

## 2016-06-08 ENCOUNTER — Inpatient Hospital Stay (HOSPITAL_COMMUNITY): Payer: Medicare Other

## 2016-06-08 DIAGNOSIS — E0800 Diabetes mellitus due to underlying condition with hyperosmolarity without nonketotic hyperglycemic-hyperosmolar coma (NKHHC): Secondary | ICD-10-CM

## 2016-06-08 DIAGNOSIS — I4891 Unspecified atrial fibrillation: Secondary | ICD-10-CM

## 2016-06-08 LAB — GLUCOSE, CAPILLARY
Glucose-Capillary: 120 mg/dL — ABNORMAL HIGH (ref 65–99)
Glucose-Capillary: 140 mg/dL — ABNORMAL HIGH (ref 65–99)
Glucose-Capillary: 193 mg/dL — ABNORMAL HIGH (ref 65–99)
Glucose-Capillary: 303 mg/dL — ABNORMAL HIGH (ref 65–99)

## 2016-06-08 LAB — CBC
HCT: 37.6 % — ABNORMAL LOW (ref 39.0–52.0)
Hemoglobin: 12 g/dL — ABNORMAL LOW (ref 13.0–17.0)
MCH: 26.5 pg (ref 26.0–34.0)
MCHC: 31.9 g/dL (ref 30.0–36.0)
MCV: 83.2 fL (ref 78.0–100.0)
Platelets: 177 10*3/uL (ref 150–400)
RBC: 4.52 MIL/uL (ref 4.22–5.81)
RDW: 14.5 % (ref 11.5–15.5)
WBC: 9.1 10*3/uL (ref 4.0–10.5)

## 2016-06-08 LAB — BASIC METABOLIC PANEL
Anion gap: 10 (ref 5–15)
BUN: 11 mg/dL (ref 6–20)
CO2: 26 mmol/L (ref 22–32)
Calcium: 9 mg/dL (ref 8.9–10.3)
Chloride: 105 mmol/L (ref 101–111)
Creatinine, Ser: 0.9 mg/dL (ref 0.61–1.24)
GFR calc Af Amer: >60 mL/min (ref >60)
GFR calc non Af Amer: >60 mL/min (ref >60)
Glucose, Bld: 117 mg/dL — ABNORMAL HIGH (ref 65–99)
Potassium: 3.3 mmol/L — ABNORMAL LOW (ref 3.5–5.1)
Sodium: 141 mmol/L (ref 135–145)

## 2016-06-08 LAB — CULTURE, BLOOD (ROUTINE X 2)
Culture: NO GROWTH
Culture: NO GROWTH
Special Requests: ADEQUATE
Special Requests: ADEQUATE

## 2016-06-08 MED ORDER — INSULIN GLARGINE 100 UNIT/ML ~~LOC~~ SOLN: 10.0000 [IU] | SUBCUTANEOUS | 11 refills | Status: DC

## 2016-06-08 MED ORDER — METHOCARBAMOL 500 MG PO TABS: 500.0000 mg | ORAL_TABLET | Freq: Three times a day (TID) | ORAL | 0 refills | Status: DC | PRN

## 2016-06-08 MED ORDER — ATORVASTATIN CALCIUM 40 MG PO TABS: 40.0000 mg | ORAL_TABLET | Freq: Every day | ORAL | 2 refills | Status: DC

## 2016-06-08 MED ORDER — APIXABAN 5 MG PO TABS: 5.0000 mg | ORAL_TABLET | Freq: Two times a day (BID) | ORAL | 2 refills | Status: DC

## 2016-06-08 MED ORDER — INSULIN GLARGINE 100 UNIT/ML ~~LOC~~ SOLN: SUBCUTANEOUS | 11 refills | Status: DC

## 2016-06-08 MED ORDER — INSULIN GLARGINE 100 UNIT/ML ~~LOC~~ SOLN: 30.0000 [IU] | Freq: Every morning | SUBCUTANEOUS | 11 refills | Status: DC

## 2016-06-08 MED ORDER — POTASSIUM CHLORIDE CRYS ER 20 MEQ PO TBCR
40.0000 meq | EXTENDED_RELEASE_TABLET | Freq: Once | ORAL | Status: AC
  Administered 2016-06-08: 40 meq via ORAL
  Filled 2016-06-08: qty 2

## 2016-06-08 MED ORDER — DILTIAZEM HCL ER COATED BEADS 180 MG PO CP24
300.0000 mg | ORAL_CAPSULE | Freq: Every day | ORAL | Status: DC
  Administered 2016-06-08: 300 mg via ORAL
  Filled 2016-06-08: qty 1

## 2016-06-08 NOTE — Progress Notes (Signed)
  Echocardiogram 2D Echocardiogram has been performed.  [name] T Charnae Lill 06/08/2016, 1:19 PM

## 2016-06-08 NOTE — Discharge Instructions (Signed)
Information on my medicine - ELIQUIS® (apixaban) ° °This medication education was reviewed with me or my healthcare representative as part of my discharge preparation.  The pharmacist that spoke with me during my hospital stay was:  Avionna Bower Kay, RPH ° °Why was Eliquis® prescribed for you? °Eliquis® was prescribed for you to reduce the risk of forming blood clots that can cause a stroke if you have a medical condition called atrial fibrillation (a type of irregular heartbeat) OR to reduce the risk of a blood clots forming after orthopedic surgery. ° °What do You need to know about Eliquis® ? °Take your Eliquis® TWICE DAILY - one tablet in the morning and one tablet in the evening with or without food.  It would be best to take the doses about the same time each day. ° °If you have difficulty swallowing the tablet whole please discuss with your pharmacist how to take the medication safely. ° °Take Eliquis® exactly as prescribed by your doctor and DO NOT stop taking Eliquis® without talking to the doctor who prescribed the medication.  Stopping may increase your risk of developing a new clot or stroke.  Refill your prescription before you run out. ° °After discharge, you should have regular check-up appointments with your healthcare provider that is prescribing your Eliquis®.  In the future your dose may need to be changed if your kidney function or weight changes by a significant amount or as you get older. ° °What do you do if you miss a dose? °If you miss a dose, take it as soon as you remember on the same day and resume taking twice daily.  Do not take more than one dose of ELIQUIS at the same time. ° °Important Safety Information °A possible side effect of Eliquis® is bleeding. You should call your healthcare provider right away if you experience any of the following: °? Bleeding from an injury or your nose that does not stop. °? Unusual colored urine (red or dark brown) or unusual colored stools (red or  black). °? Unusual bruising for unknown reasons. °? A serious fall or if you hit your head (even if there is no bleeding). ° °Some medicines may interact with Eliquis® and might increase your risk of bleeding or clotting while on Eliquis®. To help avoid this, consult your healthcare provider or pharmacist prior to using any new prescription or non-prescription medications, including herbals, vitamins, non-steroidal anti-inflammatory drugs (NSAIDs) and supplements. ° °This website has more information on Eliquis® (apixaban): [email or web address].com. ° ° °

## 2016-06-08 NOTE — Discharge Summary (Addendum)
Physician Discharge Summary  [name] MRN: [id number] DOB/AGE: 81-Feb-1930 81 y.o.  PCP: [name], MD   Admit date: 06/06/2016 Discharge date: 06/08/2016  Discharge Diagnoses:    Active Problems:   CVA (cerebral vascular accident) ([place])   Diabetes mellitus ([place])    Follow-up recommendations Follow-up with PCP in 3-5 days , including all  additional recommended appointments as below Follow-up CBC, CMP in 3-5 days Follow-up with Dr. [name] for carotid ultrasound in 6 months Follow-up with endocrinology for poorly controlled diabetes   Current Discharge Medication List    START taking these medications   Details  apixaban (ELIQUIS) 5 MG TABS tablet Take 1 tablet (5 mg total) by mouth 2 (two) times daily. Qty: 60 tablet, Refills: 2    atorvastatin (LIPITOR) 40 MG tablet Take 1 tablet (40 mg total) by mouth daily at 6 PM. Qty: 30 tablet, Refills: 2      CONTINUE these medications which have CHANGED   Details  insulin glargine (LANTUS) 100 UNIT/ML injection 30 units in am ,10 units in am Qty: 10 mL, Refills: 11    methocarbamol (ROBAXIN) 500 MG tablet Take 1 tablet (500 mg total) by mouth every 8 (eight) hours as needed for muscle spasms. Qty: 30 tablet, Refills: 0      CONTINUE these medications which have NOT CHANGED   Details  AVODART 0.5 MG capsule Take 0.5 mg by mouth every other day.     diltiazem (CARDIZEM CD) 360 MG 24 hr capsule Take 1 capsule (360 mg total) by mouth daily. Qty: 30 capsule, Refills: 6    doxycycline (VIBRA-TABS) 100 MG tablet Take 1 tablet (100 mg total) by mouth every 12 (twelve) hours. Qty: 10 tablet, Refills: 0    empagliflozin (JARDIANCE) 10 MG TABS tablet Take 10 mg by mouth daily.    meclizine (ANTIVERT) 25 MG tablet Take 1 tablet (25 mg total) by mouth 3 (three) times daily as needed for dizziness. Qty: 30 tablet, Refills: 0    metoprolol succinate (TOPROL-XL) 100 MG 24 hr tablet Take 1 tablet (100 mg total) by mouth 2  (two) times daily. Qty: 180 tablet, Refills: 3    pantoprazole (PROTONIX) 40 MG tablet Take 1 tablet by mouth daily at [name]: 90 tablet, Refills: 3    Tamsulosin HCl (FLOMAX) 0.4 MG CAPS Take 0.4 mg by mouth every other day.     nitroGLYCERIN (NITROSTAT) 0.4 MG SL tablet Place 0.4 mg under the tongue every 5 (five) minutes as needed for chest pain (x 3 doses).      STOP taking these medications     metFORMIN (GLUCOPHAGE) 1000 MG tablet      simvastatin (ZOCOR) 80 MG tablet      warfarin (COUMADIN) 5 MG tablet      enoxaparin (LOVENOX) 100 MG/ML injection       Discharge Condition: Stable   Discharge Instructions Get Medicines reviewed and adjusted: Please take all your medications with you for your next visit with your Primary MD  Please request your Primary MD to go over all hospital tests and procedure/radiological results at the follow up, please ask your Primary MD to get all Hospital records sent to his/her office.  If you experience worsening of your admission symptoms, develop shortness of breath, life threatening emergency, suicidal or homicidal thoughts you must seek medical attention immediately by calling 911 or calling your MD immediately if symptoms less severe.  You must read complete instructions/literature along with all the possible adverse reactions/side  effects for all the Medicines you take and that have been prescribed to you. Take any new Medicines after you have completely understood and accpet all the possible adverse reactions/side effects.   Do not drive when taking Pain medications.   Do not take more than prescribed Pain, Sleep and Anxiety Medications  Special Instructions: If you have smoked or chewed Tobacco in the last 2 yrs please stop smoking, stop any regular Alcohol and or any Recreational drug use.  Wear Seat belts while driving.  Please note  You were cared for by a hospitalist during your hospital stay. Once you are discharged,  your primary care physician will handle any further medical issues. Please note that NO REFILLS for any discharge medications will be authorized once you are discharged, as it is imperative that you return to your primary care physician (or establish a relationship with a primary care physician if you do not have one) for your aftercare needs so that they can reassess your need for medications and monitor your lab values.     Allergies  Allergen Reactions  . Erythromycin Base   . Amoxicillin Other (See Comments)    Unknown  . Cefadroxil Other (See Comments)    Unknown  . Ciprofloxacin Other (See Comments)    Unknown  . Erythromycin Other (See Comments)    Upset stomach      Disposition: Home with home health   Consults:  Neurology  Significant Diagnostic Studies:  Ct Angio Head W Or Wo Contrast  Result Date: 06/02/2016 CLINICAL DATA:  83 y/o  M; dizziness and neck pain. EXAM: CT ANGIOGRAPHY HEAD AND NECK TECHNIQUE: Multidetector CT imaging of the head and neck was performed using the standard protocol during bolus administration of intravenous contrast. Multiplanar CT image reconstructions and MIPs were obtained to evaluate the vascular anatomy. Carotid stenosis measurements (when applicable) are obtained utilizing NASCET criteria, using the distal internal carotid diameter as the denominator. CONTRAST:  50 cc Isovue 370 COMPARISON:  None. FINDINGS: CT HEAD FINDINGS Brain: No evidence of acute infarction, hemorrhage, hydrocephalus, extra-axial collection or mass lesion/mass effect. Mild chronic microvascular ischemic changes and parenchymal volume loss of the brain. Vascular: As below. Skull: Normal. Negative for fracture or focal lesion. Sinuses: Imaged portions are clear. Orbits: Bilateral intra-ocular lens replacement. Review of the MIP images confirms the above findings CTA NECK FINDINGS Aortic arch: 4 vessel arch. Moderate calcific atherosclerosis. No significant stenosis of great  vessel origins. Right carotid system: No evidence of dissection or occlusion. Dense calcific atherosclerosis of the carotid bifurcation and proximal ICA with moderate 60% stenosis of proximal ICA. Hairpin turn of upper cervical segment of ICA. Left carotid system: No evidence of dissection, stenosis (50% or greater) or occlusion. Dense calcific atherosclerosis of the carotid bifurcation without high-grade stenosis. Hairpin turn of upper cervical segment of ICA. Vertebral arteries: Codominant. No evidence of dissection, stenosis (50% or greater) or occlusion. Skeleton: Advanced cervical spondylosis with interbody fusion of the C5 through C7 vertebral bodies, ossification of posterior longitudinal ligament, and mild to moderate bony canal stenosis through those levels. No acute osseous abnormality is evident. Partially visualize median sternotomy postsurgical changes. Other neck: Negative. Upper chest: Right upper lobe patchy consolidation likely represents pneumonia. Review of the MIP images confirms the above findings CTA HEAD FINDINGS Anterior circulation: No significant stenosis, proximal occlusion, aneurysm, or vascular malformation. Extensive calcific atherosclerosis of cavernous and paraclinoid internal carotid arteries without significant stenosis. Posterior circulation: Right V4 segment calcified plaque with mild stenosis. Small  caliber basilar artery is likely due to variant anatomy of bilateral fetal PCA. Venous sinuses: As permitted by contrast timing, patent. Anatomic variants: Bilateral fetal posterior cerebral arteries and small anterior communicating artery. Delayed phase: No abnormal intracranial enhancement. Review of the MIP images confirms the above findings IMPRESSION: 1. No acute intracranial abnormality identified on noncontrast CT of head. After administration of intravenous contrast there is no abnormal enhancement. 2. Mild chronic microvascular ischemic changes and mild parenchymal volume  loss of the brain. 3. Patent carotid and vertebral arteries of the neck without evidence for dissection, aneurysm, or occlusion. 4. Right proximal ICA moderate 60% stenosis. 5. Patent circle of Willis without evidence for proximal occlusion, high-grade stenosis, aneurysm, or vascular malformation. 6. Right upper lobe patchy consolidation likely represents pneumonia. These results were called by telephone at the time of interpretation on 06/02/2016 at 5:16 am to Dr. Thayer Jew , who verbally acknowledged these results. Electronically Signed   By: Kristine Garbe M.D.   On: 06/02/2016 05:18   Dg Chest 2 View  Result Date: 06/06/2016 CLINICAL DATA:  Dizziness, recent fall, shortness of breath, chest pain and cough EXAM: CHEST  2 VIEW COMPARISON:  01/06/2015 FINDINGS: Cardiomegaly evident with mild central vascular congestion. No superimposed CHF, edema, collapse, consolidation or pneumonia. No large effusion or pneumothorax. Aorta is atherosclerotic and tortuous. Remote coronary bypass changes. Chronic diffuse thoracic spondylosis. Mild increased thoracic kyphosis. No severe compression fracture deformity. All posterolateral rib fractures. IMPRESSION: Stable cardiomegaly without CHF or pneumonia. No significant interval change or superimposed acute process Thoracic aortic atherosclerosis and tortuosity Electronically Signed   By: Jerilynn Mages.  Shick M.D.   On: 06/06/2016 12:23   Ct Angio Neck W And/or Wo Contrast  Result Date: 06/02/2016 CLINICAL DATA:  83 y/o  M; dizziness and neck pain. EXAM: CT ANGIOGRAPHY HEAD AND NECK TECHNIQUE: Multidetector CT imaging of the head and neck was performed using the standard protocol during bolus administration of intravenous contrast. Multiplanar CT image reconstructions and MIPs were obtained to evaluate the vascular anatomy. Carotid stenosis measurements (when applicable) are obtained utilizing NASCET criteria, using the distal internal carotid diameter as the  denominator. CONTRAST:  50 cc Isovue 370 COMPARISON:  None. FINDINGS: CT HEAD FINDINGS Brain: No evidence of acute infarction, hemorrhage, hydrocephalus, extra-axial collection or mass lesion/mass effect. Mild chronic microvascular ischemic changes and parenchymal volume loss of the brain. Vascular: As below. Skull: Normal. Negative for fracture or focal lesion. Sinuses: Imaged portions are clear. Orbits: Bilateral intra-ocular lens replacement. Review of the MIP images confirms the above findings CTA NECK FINDINGS Aortic arch: 4 vessel arch. Moderate calcific atherosclerosis. No significant stenosis of great vessel origins. Right carotid system: No evidence of dissection or occlusion. Dense calcific atherosclerosis of the carotid bifurcation and proximal ICA with moderate 60% stenosis of proximal ICA. Hairpin turn of upper cervical segment of ICA. Left carotid system: No evidence of dissection, stenosis (50% or greater) or occlusion. Dense calcific atherosclerosis of the carotid bifurcation without high-grade stenosis. Hairpin turn of upper cervical segment of ICA. Vertebral arteries: Codominant. No evidence of dissection, stenosis (50% or greater) or occlusion. Skeleton: Advanced cervical spondylosis with interbody fusion of the C5 through C7 vertebral bodies, ossification of posterior longitudinal ligament, and mild to moderate bony canal stenosis through those levels. No acute osseous abnormality is evident. Partially visualize median sternotomy postsurgical changes. Other neck: Negative. Upper chest: Right upper lobe patchy consolidation likely represents pneumonia. Review of the MIP images confirms the above findings CTA HEAD FINDINGS  Anterior circulation: No significant stenosis, proximal occlusion, aneurysm, or vascular malformation. Extensive calcific atherosclerosis of cavernous and paraclinoid internal carotid arteries without significant stenosis. Posterior circulation: Right V4 segment calcified plaque  with mild stenosis. Small caliber basilar artery is likely due to variant anatomy of bilateral fetal PCA. Venous sinuses: As permitted by contrast timing, patent. Anatomic variants: Bilateral fetal posterior cerebral arteries and small anterior communicating artery. Delayed phase: No abnormal intracranial enhancement. Review of the MIP images confirms the above findings IMPRESSION: 1. No acute intracranial abnormality identified on noncontrast CT of head. After administration of intravenous contrast there is no abnormal enhancement. 2. Mild chronic microvascular ischemic changes and mild parenchymal volume loss of the brain. 3. Patent carotid and vertebral arteries of the neck without evidence for dissection, aneurysm, or occlusion. 4. Right proximal ICA moderate 60% stenosis. 5. Patent circle of Willis without evidence for proximal occlusion, high-grade stenosis, aneurysm, or vascular malformation. 6. Right upper lobe patchy consolidation likely represents pneumonia. These results were called by telephone at the time of interpretation on 06/02/2016 at 5:16 am to Dr. Thayer Jew , who verbally acknowledged these results. Electronically Signed   By: Kristine Garbe M.D.   On: 06/02/2016 05:18   Mr Brain Wo Contrast  Result Date: 06/06/2016 CLINICAL DATA:  Dizziness with fall. EXAM: MRI HEAD WITHOUT CONTRAST TECHNIQUE: Multiplanar, multiecho pulse sequences of the brain and surrounding structures were obtained without intravenous contrast. COMPARISON:  Head CT and CTA 06/02/2016 FINDINGS: Brain: 3 mm acute infarct in the low left parietal cortex. No brainstem or cerebellar infarct or edema to explain history of dizziness. No hemorrhage, hydrocephalus, or mass. There is generalized cerebral volume loss that is mild for age. Mild chronic microvascular ischemic change in the cerebral white matter. Vascular: Normal flow voids. Skull and upper cervical spine: No marrow lesion noted. Sinuses/Orbits: Mild  mucosal thickening in the paranasal sinuses. Negative temporal bones. Grossly symmetric labyrinthine signal. Bilateral cataract resection. IMPRESSION: 1. 3 mm acute infarct in the left parietal cortex. 2. No findings in the brainstem or cerebellum to explain dizziness. 3. Age congruent senescent changes. Electronically Signed   By: Monte Fantasia M.D.   On: 06/06/2016 14:54   Mr Cervical Spine Wo Contrast  Result Date: 06/06/2016 CLINICAL DATA:  Dizziness. Fell and hit head. Acute infarct noted on MRI. EXAM: MRI CERVICAL SPINE WITHOUT CONTRAST TECHNIQUE: Multiplanar, multisequence MR imaging of the cervical spine was performed. No intravenous contrast was administered. COMPARISON:  CT scan 06/02/2013 FINDINGS: Alignment: Normal overall alignment. Interbody fusion changes at C5-6 and C6-7. Vertebrae: No acute fracture or bone lesion. No abnormal increased STIR signal intensity in the posterior elements or soft tissues. Cord: Normal signal intensity.  No cord lesions or syrinx. Posterior Fossa, vertebral arteries, paraspinal tissues: No significant findings. Disc levels: C2-3:  No significant findings. C3-4: Broad-based disc protrusion with mass effect on the ventral thecal sac and narrowing of the ventral CSF space. There is also uncinate spurring changes bilaterally, right greater than left with mild right foraminal stenosis. C4-5: Disc disease and facet disease. Bulging annulus, osteophytic ridging uncinate spurring with flattening of the ventral thecal sac and narrowing of the ventral CSF space. Mild to moderate bilateral foraminal stenosis. C5-6: Interbody fusion changes. Osteophytic ridging posteriorly to the left with mass effect on the thecal sac. No significant foraminal stenosis. C6-7: Interbody fusion changes. Osteophytic ridging posteriorly with mild impression on the ventral thecal sac. No significant foraminal stenosis. C7-T1:  No significant findings. T1-2: Small left-sided disc  osteophyte complex  with mild mass effect on the thecal sac and possibly affecting the left T1 nerve root. IMPRESSION: 1. Degenerative cervical spondylosis with multilevel disc disease and facet disease. Interbody fusion changes at C5-6 and C6-7 are noted. 2. Multilevel spinal and foraminal stenosis as described above at the individual levels. 3. No acute bony abnormality or cord injury. Electronically Signed   By: Marijo Sanes M.D.   On: 06/06/2016 15:53    echocardiogram        Filed Weights   06/08/16 0300  Weight: 95.3 kg (210 lb 1 oz)     Microbiology: Recent Results (from the past 240 hour(s))  Blood culture (routine x 2)     Status: None (Preliminary result)   Collection Time: 06/02/16  6:05 AM  Result Value Ref Range Status   Specimen Description BLOOD RIGHT ARM  Final   Special Requests   Final    BOTTLES DRAWN AEROBIC AND ANAEROBIC Blood Culture adequate volume   Culture NO GROWTH 4 DAYS  Final   Report Status PENDING  Incomplete  Blood culture (routine x 2)     Status: None (Preliminary result)   Collection Time: 06/02/16  6:15 AM  Result Value Ref Range Status   Specimen Description BLOOD LEFT HAND  Final   Special Requests   Final    BOTTLES DRAWN AEROBIC AND ANAEROBIC Blood Culture adequate volume   Culture NO GROWTH 4 DAYS  Final   Report Status PENDING  Incomplete       Blood Culture    Component Value Date/Time   SDES BLOOD LEFT HAND 06/02/2016 0615   SPECREQUEST  06/02/2016 0615    BOTTLES DRAWN AEROBIC AND ANAEROBIC Blood Culture adequate volume   CULT NO GROWTH 4 DAYS 06/02/2016 0615   REPTSTATUS PENDING 06/02/2016 0615      Labs: Results for orders placed or performed during the hospital encounter of 06/06/16 (from the past 48 hour(s))  I-stat troponin, ED     Status: None   Collection Time: 06/06/16 11:36 AM  Result Value Ref Range   Troponin i, poc 0.00 0.00 - 0.08 ng/mL   Comment 3            Comment: Due to the release kinetics of cTnI, a negative result  within the first hours of the onset of symptoms does not rule out myocardial infarction with certainty. If myocardial infarction is still suspected, repeat the test at appropriate intervals.   CBC with Differential/Platelet     Status: Abnormal   Collection Time: 06/06/16 11:43 AM  Result Value Ref Range   WBC 8.7 4.0 - 10.5 K/uL   RBC 4.58 4.22 - 5.81 MIL/uL   Hemoglobin 12.7 (L) 13.0 - 17.0 g/dL   HCT 38.3 (L) 39.0 - 52.0 %   MCV 83.6 78.0 - 100.0 fL   MCH 27.7 26.0 - 34.0 pg   MCHC 33.2 30.0 - 36.0 g/dL   RDW 15.1 11.5 - 15.5 %   Platelets 160 150 - 400 K/uL   Neutrophils Relative % 71 %   Neutro Abs 6.3 1.7 - 7.7 K/uL   Lymphocytes Relative 17 %   Lymphs Abs 1.5 0.7 - 4.0 K/uL   Monocytes Relative 8 %   Monocytes Absolute 0.7 0.1 - 1.0 K/uL   Eosinophils Relative 3 %   Eosinophils Absolute 0.2 0.0 - 0.7 K/uL   Basophils Relative 1 %   Basophils Absolute 0.0 0.0 - 0.1 K/uL  Comprehensive metabolic panel  Status: Abnormal   Collection Time: 06/06/16 11:43 AM  Result Value Ref Range   Sodium 141 135 - 145 mmol/L   Potassium 4.8 3.5 - 5.1 mmol/L   Chloride 107 101 - 111 mmol/L   CO2 28 22 - 32 mmol/L   Glucose, Bld 163 (H) 65 - 99 mg/dL   BUN 14 6 - 20 mg/dL   Creatinine, Ser 1.02 0.61 - 1.24 mg/dL   Calcium 9.2 8.9 - 10.3 mg/dL   Total Protein 6.6 6.5 - 8.1 g/dL   Albumin 3.3 (L) 3.5 - 5.0 g/dL   AST 36 15 - 41 U/L   ALT 20 17 - 63 U/L   Alkaline Phosphatase 54 38 - 126 U/L   Total Bilirubin 1.0 0.3 - 1.2 mg/dL   GFR calc non Af Amer >60 >60 mL/min   GFR calc Af Amer >60 >60 mL/min    Comment: (NOTE) The eGFR has been calculated using the CKD EPI equation. This calculation has not been validated in all clinical situations. eGFR's persistently <60 mL/min signify possible Chronic Kidney Disease.    Anion gap 6 5 - 15  Protime-INR     Status: Abnormal   Collection Time: 06/06/16 11:44 AM  Result Value Ref Range   Prothrombin Time 24.9 (H) 11.4 - 15.2 seconds    INR 2.21   Glucose, capillary     Status: Abnormal   Collection Time: 06/06/16  9:40 PM  Result Value Ref Range   Glucose-Capillary 151 (H) 65 - 99 mg/dL   Comment 1 Notify RN    Comment 2 Document in Chart   Glucose, capillary     Status: Abnormal   Collection Time: 06/07/16  1:00 AM  Result Value Ref Range   Glucose-Capillary 123 (H) 65 - 99 mg/dL   Comment 1 Notify RN    Comment 2 Document in Chart   Glucose, capillary     Status: Abnormal   Collection Time: 06/07/16  4:11 AM  Result Value Ref Range   Glucose-Capillary 101 (H) 65 - 99 mg/dL   Comment 1 Notify RN    Comment 2 Document in Chart   Lipid panel     Status: Abnormal   Collection Time: 06/07/16  5:23 AM  Result Value Ref Range   Cholesterol 178 0 - 200 mg/dL   Triglycerides 133 <150 mg/dL   HDL 45 >40 mg/dL   Total CHOL/HDL Ratio 4.0 RATIO   VLDL 27 0 - 40 mg/dL   LDL Cholesterol 106 (H) 0 - 99 mg/dL    Comment:        Total Cholesterol/HDL:CHD Risk Coronary Heart Disease Risk Table                     Men   Women  1/2 Average Risk   3.4   3.3  Average Risk       5.0   4.4  2 X Average Risk   9.6   7.1  3 X Average Risk  23.4   11.0        Use the calculated Patient Ratio above and the CHD Risk Table to determine the patient's CHD Risk.        ATP III CLASSIFICATION (LDL):  <100     mg/dL   Optimal  100-129  mg/dL   Near or Above                    Optimal  130-159  mg/dL   Borderline  160-189  mg/dL   High  >190     mg/dL   Very High   Vitamin B12     Status: None   Collection Time: 06/07/16  5:23 AM  Result Value Ref Range   Vitamin B-12 313 180 - 914 pg/mL    Comment: (NOTE) This assay is not validated for testing neonatal or myeloproliferative syndrome specimens for Vitamin B12 levels.   Protime-INR     Status: Abnormal   Collection Time: 06/07/16  5:23 AM  Result Value Ref Range   Prothrombin Time 22.1 (H) 11.4 - 15.2 seconds   INR 1.90   Glucose, capillary     Status: None    Collection Time: 06/07/16  8:01 AM  Result Value Ref Range   Glucose-Capillary 83 65 - 99 mg/dL   Comment 1 Notify RN    Comment 2 Document in Chart   Glucose, capillary     Status: Abnormal   Collection Time: 06/07/16 11:57 AM  Result Value Ref Range   Glucose-Capillary 159 (H) 65 - 99 mg/dL   Comment 1 Notify RN    Comment 2 Document in Chart   Glucose, capillary     Status: Abnormal   Collection Time: 06/07/16  4:54 PM  Result Value Ref Range   Glucose-Capillary 224 (H) 65 - 99 mg/dL   Comment 1 Notify RN    Comment 2 Document in Chart   Glucose, capillary     Status: Abnormal   Collection Time: 06/07/16  9:27 PM  Result Value Ref Range   Glucose-Capillary 266 (H) 65 - 99 mg/dL   Comment 1 Notify RN    Comment 2 Document in Chart   Glucose, capillary     Status: Abnormal   Collection Time: 06/07/16 11:31 PM  Result Value Ref Range   Glucose-Capillary 193 (H) 65 - 99 mg/dL   Comment 1 Notify RN    Comment 2 Document in Chart   Glucose, capillary     Status: Abnormal   Collection Time: 06/08/16  3:49 AM  Result Value Ref Range   Glucose-Capillary 140 (H) 65 - 99 mg/dL   Comment 1 Notify RN    Comment 2 Document in Chart   Basic metabolic panel     Status: Abnormal   Collection Time: 06/08/16  5:33 AM  Result Value Ref Range   Sodium 141 135 - 145 mmol/L   Potassium 3.3 (L) 3.5 - 5.1 mmol/L   Chloride 105 101 - 111 mmol/L   CO2 26 22 - 32 mmol/L   Glucose, Bld 117 (H) 65 - 99 mg/dL   BUN 11 6 - 20 mg/dL   Creatinine, Ser 0.90 0.61 - 1.24 mg/dL   Calcium 9.0 8.9 - 10.3 mg/dL   GFR calc non Af Amer >60 >60 mL/min   GFR calc Af Amer >60 >60 mL/min    Comment: (NOTE) The eGFR has been calculated using the CKD EPI equation. This calculation has not been validated in all clinical situations. eGFR's persistently <60 mL/min signify possible Chronic Kidney Disease.    Anion gap 10 5 - 15  CBC     Status: Abnormal   Collection Time: 06/08/16  5:33 AM  Result Value Ref  Range   WBC 9.1 4.0 - 10.5 K/uL   RBC 4.52 4.22 - 5.81 MIL/uL   Hemoglobin 12.0 (L) 13.0 - 17.0 g/dL   HCT 37.6 (L) 39.0 - 52.0 %   MCV 83.2 78.0 -  100.0 fL   MCH 26.5 26.0 - 34.0 pg   MCHC 31.9 30.0 - 36.0 g/dL   RDW 14.5 11.5 - 15.5 %   Platelets 177 150 - 400 K/uL  Glucose, capillary     Status: Abnormal   Collection Time: 06/08/16  6:07 AM  Result Value Ref Range   Glucose-Capillary 120 (H) 65 - 99 mg/dL   Comment 1 Notify RN    Comment 2 Document in Chart      Lipid Panel     Component Value Date/Time   CHOL 178 06/07/2016 0523   TRIG 133 06/07/2016 0523   HDL 45 06/07/2016 0523   CHOLHDL 4.0 06/07/2016 0523   VLDL 27 06/07/2016 0523   LDLCALC 106 (H) 06/07/2016 0523     Lab Results  Component Value Date   HGBA1C 9.0 (H) 06/02/2016   HGBA1C 8.2 (H) 11/28/2010   HGBA1C (H) 11/03/2009    7.2 (NOTE)                                                                       According to the ADA Clinical Practice Recommendations for 2011, when HbA1c is used as a screening test:   >=6.5%   Diagnostic of Diabetes Mellitus           (if abnormal result  is confirmed)  5.7-6.4%   Increased risk of developing Diabetes Mellitus  References:Diagnosis and Classification of Diabetes Mellitus,Diabetes QMGQ,6761,95(KDTOI 1):S62-S69 and Standards of Medical Care in         Diabetes - 2011,Diabetes Care,2011,34  (Suppl 1):S11-S61.      HPI :  16 row male with hypertension, hyperlipidemia, diabetes, CAD with atrial fibrillation on Coumadin. Patient presents to the emergency department after falling this morning at 2:30. Has had increasing vertigo since the beginning of the month, but has had worsening vertigo since his fall this morning. The vertigo is constant and is independent of movement. BP (!) 121/92  Pulse 85  Temp 97.9 F (36.6 C)  Resp 19  SpO2 96% awake and oriented 3, no acute distress. Regular rate with normal S1 S2. Clear to auscultation bilaterally. Abdomen soft  nontender. No focal muscular neurological deficits. Neurology consulted. MRI shows acute infarct of left parietal cortex.   HOSPITAL COURSE:  Stroke:  left parietal punctate infarct (MCA territory), embolic likely secondary to afib with fluctuating INR  Resultant  Slight lightheadedness  CTA head and neck no acute abnormality. R ICA 60% stenosis.   MRI  L parietal punctate infarct  MR CSpine  No acute abnormality. Degenerative spondylosis. Multilevel spine and foraminal stenosis.   2D Echo  pending  LDL 106  HgbA1c 9.0  Diet Heart Room service appropriate? Yes; Fluid consistency: Thin  warfarin daily prior to admission (had also used lovenox as bridge, INR now therapeutic), now on warfarin daily. Due to INR fluctuation, recommend switch to eliquis for stroke prevention.   Therapy recommendations:  HH PT  Disposition:   home with home health No neurology follow-up recommended at this time  Carotid stenosis  Right ICA 60% stenosis  VVS Dr. Eden Lathe following with pt  Plan for repeat CUS in 6 months.  Neck pain Patient had MRI of C-spine that showed Degenerative cervical spondylosis  with multilevel disc disease and facet disease. Interbody fusion changes at C5-6 and C6-7 are noted. Patient given a prescription for Robaxin.  Recent community-acquired pneumonia Discharged on doxycycline, completed treatment Repeat chest x-ray stable  Atrial fibrillation , chronic -CHADSVASC 5 (based on age, CAD, DM, HTN) -INR 1.8 >1.9, may need to change Coumadin to eliquis  -Continue cardizem and metoprolol    Diabetes mellitus, type II, hemoglobin A1c 9.0 Patient noted to have evening hyperglycemia Change to Lantus 30 units in the morning and 10 units in the evening   Hypertension -Continue metoprolol, cardizem, lisinopril -Follow up with PCP     CAD -S/p CABG,DES of RCA 2016.  -Curently no chest pain -Continue statin, ELIQUIS , metoprolol  BPH -Continue home  meds  Discharge Exam:   Blood pressure (!) 146/84, pulse (!) 53, temperature 98.1 F (36.7 C), temperature source Oral, resp. rate 18, height _0  (1.702 m), weight 95.3 kg (210 lb 1 oz), SpO2 95 %.  General: WD obese Caucasian man lying on ED gurney in no acute distress. AAO x4. He is slightly hard of hearing. Speech clear, no dysarthria. No aphasia. Follows commands briskly. Affect is bright with congruent mood. Comportment is normal.  HEENT: Normocephalic. Neck supple without LAD. MMM, OP clear. Dentition good. Sclerae anicteric. No conjunctival injection.  CV: Regular, distant, no obvious murmur. Carotid pulses full and symmetric, no bruits. Distal pulses 2+ and symmetric.  Lungs: CTAB on anterior auscultation.  Abdomen: Soft, obese, non-distended, non-tender. Bowel sounds present x4.  Extremities: No C/C/E. Neuro:  CN: Pupils are equal and round. They are symmetrically reactive from 3-->2 mm. visual fields are full to confrontation. EOMI without nystagmus. No reported diplopia. Facial sensation is intact to light touch. Face is symmetric at rest with normal strength and mobility.   Motor: Normal bulk, tone, and strength. No tremor or other abnormal movements. No drift.  Sensation: Intact to light touch.     Follow-up Information    ANDY,CAMILLE L, MD. Call.   Specialty:  Family Medicine Why:  PCP to set up follow-up appointment Contact information: [address] [place] [address] [phone]           Signed: Reyne Dumas 06/08/2016, 8:14 AM        Time spent >45 mins

## 2016-06-08 NOTE — Care Management Note (Signed)
Case Management Note  Patient Details  Name: [name] MRN: [id number] Date of Birth: 1928-03-28  Subjective/Objective:     Pt admitted with CVA. He is from home with his wife. He was active with [facility] for RN/PT/OT prior to admission.                Action/Plan: Plan is for patient to return home with [facility] services. CM spoke to Mrs Squier and she would like to continue with [facility]. CM notified Santiago Glad with [facility] of probable d/c today and current [place] orders. Wife to provide transportation home.   Expected Discharge Date:                  Expected Discharge Plan:  [place]  In-House Referral:     Discharge planning Services  CM Consult  Post Acute Care Choice:  Home Health Choice offered to:  Spouse  DME Arranged:    DME Agency:     HH Arranged:  PT, OT, Nurse's Aide [place] Agency:  Home  Status of Service:  Completed, signed off  If discussed at [place] of Stay Meetings, dates discussed:    Additional Comments:  Pollie Friar, RN 06/08/2016, 11:00 AM

## 2016-06-08 NOTE — Progress Notes (Signed)
Occupational Therapy Treatment Patient Details Name: [name] MRN: [id number] DOB: Mar 06, 1928 Today's Date: 06/08/2016    History of present illness Patient is an 83 y/o male with hypertension, hyperlipidemia, diabetes, CAD with atrial fibrillation on Coumadin. Patient presented  after falling at home.  Has had increasing vertigo since the beginning of the month, but has had worsening vertigo since his fall previous to this admission.  Pt had MRI of Brain which shows 3 mm acute infarct in the low left parietal cortex. MRI  of c spine shows c3-c4 Broad based disc protrusion.   OT comments  Pt requires min guard assist for ADLs.  He is eager for discharge home.  Recommend HHOT.    Follow Up Recommendations  Home health OT;Supervision/Assistance - 24 hour    Equipment Recommendations  None recommended by OT    Recommendations for Other Services      Precautions / Restrictions Precautions Precautions: Fall       Mobility Bed Mobility                  Transfers Overall transfer level: Needs assistance Equipment used: Rolling walker (2 wheeled) Transfers: Sit to/from [facility] Sit to Stand: Min guard Stand pivot transfers: Min guard       General transfer comment: min guard for safety     Balance Overall balance assessment: Needs assistance Sitting-balance support: Feet supported Sitting balance-Leahy Scale: Good     Standing balance support: No upper extremity supported Standing balance-Leahy Scale: Fair                             ADL either performed or assessed with clinical judgement   ADL Overall ADL's : Needs assistance/impaired     Grooming: Wash/dry hands;Wash/dry face;Oral care;Min guard;Standing                   Toilet Transfer: Min guard;Ambulation;Comfort height toilet;RW   Toileting- Water quality scientist and Hygiene: Min guard;Sit to/from stand       Functional mobility during ADLs: Clinical biochemist     Praxis      Cognition Arousal/Alertness: Awake/alert Behavior During Therapy: WFL for tasks assessed/performed Overall Cognitive Status: Within Functional Limits for tasks assessed                                          Exercises     Shoulder Instructions       General Comments wife present     Pertinent Vitals/ Pain       Pain Assessment: Faces Faces Pain Scale: Hurts little more Pain Location: neck  Pain Descriptors / Indicators: Aching;Grimacing Pain Intervention(s): Monitored during session  Home Living                                          Prior Functioning/Environment              Frequency  Min 2X/week        Progress Toward Goals  OT Goals(current goals can now be found in the care plan section)  Progress towards OT goals: Progressing toward goals     Plan Discharge  plan remains appropriate    Co-evaluation                 End of Session Equipment Utilized During Treatment: Rolling walker;Gait belt  OT Visit Diagnosis: Unsteadiness on feet (R26.81)   Activity Tolerance Patient tolerated treatment well   Patient Left in chair;with call bell/phone within reach;with family/visitor present   Nurse Communication Mobility status        Time: 2336-1224 OT Time Calculation (min): 22 min  Charges: OT General Charges $OT Visit: 1 Procedure OT Treatments $Self Care/Home Management : 8-22 mins  [facility], OTR/L [phone]    Lucille Passy M 06/08/2016, 12:06 PM

## 2016-06-08 NOTE — Progress Notes (Signed)
RN discussed patient's discharge instructions with patient and patient's wife. Both verbalized understanding of discharge instructions including f/u appt needed with pcp in 1-2 weeks, carotid US appt. Both verbalized understanding of eliquis indication, route, frequency, s/sx to report to MD. Per MD, discontinue doxycycline. Written on d/c avs. Per MD, continue jardiance until f/u with pcp. Patient notified, written on avs.  MD notified patient's HR elevated to 170-180 while ambulating with PT. Cardizem given, per MD, continue with discharge.  Neuro assessment unchanged, NIHHS 0. IV removed, tele removed. Patient is waiting for ride to arrive.

## 2016-06-08 NOTE — Progress Notes (Signed)
CM consulted for 30 day free Eliquis card. CM provided card to patients wife. Pt states he will receive Eliquis through the [place] after the first 30 days.

## 2016-06-09 DIAGNOSIS — I2510 Atherosclerotic heart disease of native coronary artery without angina pectoris: Secondary | ICD-10-CM | POA: Diagnosis not present

## 2016-06-09 DIAGNOSIS — J189 Pneumonia, unspecified organism: Secondary | ICD-10-CM | POA: Diagnosis not present

## 2016-06-09 DIAGNOSIS — I482 Chronic atrial fibrillation: Secondary | ICD-10-CM | POA: Diagnosis not present

## 2016-06-09 DIAGNOSIS — R270 Ataxia, unspecified: Secondary | ICD-10-CM | POA: Diagnosis not present

## 2016-06-09 DIAGNOSIS — N400 Enlarged prostate without lower urinary tract symptoms: Secondary | ICD-10-CM | POA: Diagnosis not present

## 2016-06-09 DIAGNOSIS — E119 Type 2 diabetes mellitus without complications: Secondary | ICD-10-CM | POA: Diagnosis not present

## 2016-06-10 DIAGNOSIS — J189 Pneumonia, unspecified organism: Secondary | ICD-10-CM | POA: Diagnosis not present

## 2016-06-10 DIAGNOSIS — E119 Type 2 diabetes mellitus without complications: Secondary | ICD-10-CM | POA: Diagnosis not present

## 2016-06-10 DIAGNOSIS — I2510 Atherosclerotic heart disease of native coronary artery without angina pectoris: Secondary | ICD-10-CM | POA: Diagnosis not present

## 2016-06-10 DIAGNOSIS — N400 Enlarged prostate without lower urinary tract symptoms: Secondary | ICD-10-CM | POA: Diagnosis not present

## 2016-06-10 DIAGNOSIS — R270 Ataxia, unspecified: Secondary | ICD-10-CM | POA: Diagnosis not present

## 2016-06-10 DIAGNOSIS — I482 Chronic atrial fibrillation: Secondary | ICD-10-CM | POA: Diagnosis not present

## 2016-06-10 LAB — ECHOCARDIOGRAM COMPLETE
Height: 67 in
Weight: 3361.04 oz

## 2016-06-11 DIAGNOSIS — I2510 Atherosclerotic heart disease of native coronary artery without angina pectoris: Secondary | ICD-10-CM | POA: Diagnosis not present

## 2016-06-11 DIAGNOSIS — I10 Essential (primary) hypertension: Secondary | ICD-10-CM | POA: Diagnosis not present

## 2016-06-11 DIAGNOSIS — I482 Chronic atrial fibrillation: Secondary | ICD-10-CM | POA: Diagnosis not present

## 2016-06-11 DIAGNOSIS — E1165 Type 2 diabetes mellitus with hyperglycemia: Secondary | ICD-10-CM | POA: Diagnosis not present

## 2016-06-11 DIAGNOSIS — Z8673 Personal history of transient ischemic attack (TIA), and cerebral infarction without residual deficits: Secondary | ICD-10-CM | POA: Diagnosis not present

## 2016-06-11 DIAGNOSIS — E1159 Type 2 diabetes mellitus with other circulatory complications: Secondary | ICD-10-CM | POA: Diagnosis not present

## 2016-06-11 DIAGNOSIS — Z794 Long term (current) use of insulin: Secondary | ICD-10-CM | POA: Diagnosis not present

## 2016-06-11 DIAGNOSIS — R42 Dizziness and giddiness: Secondary | ICD-10-CM | POA: Diagnosis not present

## 2016-06-13 DIAGNOSIS — I2510 Atherosclerotic heart disease of native coronary artery without angina pectoris: Secondary | ICD-10-CM | POA: Insufficient documentation

## 2016-06-14 ENCOUNTER — Other Ambulatory Visit: Payer: Self-pay | Admitting: *Deleted

## 2016-06-14 NOTE — Patient Outreach (Signed)
[facility]) Care Management  06/14/2016  [name] 29-Oct-1928 [id number]  EMMI-Stroke -red dashboard referral -scheduled follow up appointment-no:   Telephone call to patient; left HIPPA compliant voice mail requesting call back.  Plan: will follow up.  Sherrin Daisy, RN BSN [place] Management Coordinator [facility] Care Management  [phone]

## 2016-06-15 ENCOUNTER — Other Ambulatory Visit: Payer: Self-pay | Admitting: *Deleted

## 2016-06-15 DIAGNOSIS — R270 Ataxia, unspecified: Secondary | ICD-10-CM | POA: Diagnosis not present

## 2016-06-15 DIAGNOSIS — J189 Pneumonia, unspecified organism: Secondary | ICD-10-CM | POA: Diagnosis not present

## 2016-06-15 DIAGNOSIS — I2510 Atherosclerotic heart disease of native coronary artery without angina pectoris: Secondary | ICD-10-CM | POA: Diagnosis not present

## 2016-06-15 DIAGNOSIS — I482 Chronic atrial fibrillation: Secondary | ICD-10-CM | POA: Diagnosis not present

## 2016-06-15 DIAGNOSIS — E119 Type 2 diabetes mellitus without complications: Secondary | ICD-10-CM | POA: Diagnosis not present

## 2016-06-15 DIAGNOSIS — N400 Enlarged prostate without lower urinary tract symptoms: Secondary | ICD-10-CM | POA: Diagnosis not present

## 2016-06-15 NOTE — Patient Outreach (Signed)
[facility]) Care Management  06/15/2016  [name] Dec 15, 1928 [id number]  EMMI-Stroke call via red dashboard referral ; reason-no follow up appointment has been set up 06/10/2016.  Telephone call to patient who was advised of reason for call. HIPPA verification received from patient.   Patient voices that since automated call he has been to primary office appointment for hospital follow up on 06/11/2016. States he has also been to [place] in [place] , [place] where he saw his primary doctor & got all of his prescriptions filled.  States grandson provides transportation & that spouse assists him at home as needed. States he currently uses walker to assist with walking and has not had any falls since day of admission.    Receiving home health services from [place] which consist of RN, physical & occupational therapy. States services have already started & someone is calling him every day since hospital discharge.  States he does have contact number for agency if he needs to call them.  Voices that he is managing his own medications and is taking as prescribed by his doctors.    States he has Scientist, clinical (histocompatibility and immunogenetics) on stroke and is aware of symptoms of stroke & knows to call 911.   States he has no further concerns at this time.  EMMI call addressed & completed.  Plan: Close case/send to care management assistant.  Sherrin Daisy, RN BSN [place] Management Coordinator [facility] Care Management  [phone]

## 2016-06-18 DIAGNOSIS — E119 Type 2 diabetes mellitus without complications: Secondary | ICD-10-CM | POA: Diagnosis not present

## 2016-06-18 DIAGNOSIS — I2510 Atherosclerotic heart disease of native coronary artery without angina pectoris: Secondary | ICD-10-CM | POA: Diagnosis not present

## 2016-06-18 DIAGNOSIS — N400 Enlarged prostate without lower urinary tract symptoms: Secondary | ICD-10-CM | POA: Diagnosis not present

## 2016-06-18 DIAGNOSIS — I482 Chronic atrial fibrillation: Secondary | ICD-10-CM | POA: Diagnosis not present

## 2016-06-18 DIAGNOSIS — J189 Pneumonia, unspecified organism: Secondary | ICD-10-CM | POA: Diagnosis not present

## 2016-06-18 DIAGNOSIS — R270 Ataxia, unspecified: Secondary | ICD-10-CM | POA: Diagnosis not present

## 2016-06-20 DIAGNOSIS — N400 Enlarged prostate without lower urinary tract symptoms: Secondary | ICD-10-CM | POA: Diagnosis not present

## 2016-06-20 DIAGNOSIS — R270 Ataxia, unspecified: Secondary | ICD-10-CM | POA: Diagnosis not present

## 2016-06-20 DIAGNOSIS — I2510 Atherosclerotic heart disease of native coronary artery without angina pectoris: Secondary | ICD-10-CM | POA: Diagnosis not present

## 2016-06-20 DIAGNOSIS — E119 Type 2 diabetes mellitus without complications: Secondary | ICD-10-CM | POA: Diagnosis not present

## 2016-06-20 DIAGNOSIS — J189 Pneumonia, unspecified organism: Secondary | ICD-10-CM | POA: Diagnosis not present

## 2016-06-20 DIAGNOSIS — I482 Chronic atrial fibrillation: Secondary | ICD-10-CM | POA: Diagnosis not present

## 2016-06-21 DIAGNOSIS — R270 Ataxia, unspecified: Secondary | ICD-10-CM | POA: Diagnosis not present

## 2016-06-21 DIAGNOSIS — N400 Enlarged prostate without lower urinary tract symptoms: Secondary | ICD-10-CM | POA: Diagnosis not present

## 2016-06-21 DIAGNOSIS — I2510 Atherosclerotic heart disease of native coronary artery without angina pectoris: Secondary | ICD-10-CM | POA: Diagnosis not present

## 2016-06-21 DIAGNOSIS — J189 Pneumonia, unspecified organism: Secondary | ICD-10-CM | POA: Diagnosis not present

## 2016-06-21 DIAGNOSIS — E119 Type 2 diabetes mellitus without complications: Secondary | ICD-10-CM | POA: Diagnosis not present

## 2016-06-21 DIAGNOSIS — I482 Chronic atrial fibrillation: Secondary | ICD-10-CM | POA: Diagnosis not present

## 2016-06-22 ENCOUNTER — Other Ambulatory Visit: Payer: Self-pay | Admitting: *Deleted

## 2016-06-22 NOTE — Patient Outreach (Signed)
[facility]) Care Management  06/22/2016  Enio Hornback 1928/10/22 [id number]   EMMI-Stroke referral for red dashboard for "feeling worse overall-yes": day#9. 06/18/16  Telephone call to patient who states he did not answer yes to question. States he is doing well & feels better every day.   Voices he is taking medications as prescribed by his doctors & has attended all of appointments up to this date. No hospital admissions or emergency room visits since recent hospital stay.  Voices that is will be discharged from home health services after this week. States he continues to use cane & has not had any falls.   States he knows signs of stroke & will call 911 if any symptoms occur.  EMMI call completed. Patient voices no needs or concerns at this time.  Sherrin Daisy, RN BSN [place] Management Coordinator [facility] Care Management  [phone]

## 2016-06-23 DIAGNOSIS — J189 Pneumonia, unspecified organism: Secondary | ICD-10-CM | POA: Diagnosis not present

## 2016-06-23 DIAGNOSIS — I482 Chronic atrial fibrillation: Secondary | ICD-10-CM | POA: Diagnosis not present

## 2016-06-23 DIAGNOSIS — E119 Type 2 diabetes mellitus without complications: Secondary | ICD-10-CM | POA: Diagnosis not present

## 2016-06-23 DIAGNOSIS — R270 Ataxia, unspecified: Secondary | ICD-10-CM | POA: Diagnosis not present

## 2016-06-23 DIAGNOSIS — I2510 Atherosclerotic heart disease of native coronary artery without angina pectoris: Secondary | ICD-10-CM | POA: Diagnosis not present

## 2016-06-23 DIAGNOSIS — N400 Enlarged prostate without lower urinary tract symptoms: Secondary | ICD-10-CM | POA: Diagnosis not present

## 2016-06-24 ENCOUNTER — Ambulatory Visit: Payer: Self-pay | Admitting: Pharmacist Clinician (PhC)/ Clinical Pharmacy Specialist

## 2016-06-24 DIAGNOSIS — N400 Enlarged prostate without lower urinary tract symptoms: Secondary | ICD-10-CM | POA: Diagnosis not present

## 2016-06-24 DIAGNOSIS — I2510 Atherosclerotic heart disease of native coronary artery without angina pectoris: Secondary | ICD-10-CM | POA: Diagnosis not present

## 2016-06-24 DIAGNOSIS — I482 Chronic atrial fibrillation: Secondary | ICD-10-CM | POA: Diagnosis not present

## 2016-06-24 DIAGNOSIS — Z5181 Encounter for therapeutic drug level monitoring: Secondary | ICD-10-CM

## 2016-06-24 DIAGNOSIS — R270 Ataxia, unspecified: Secondary | ICD-10-CM | POA: Diagnosis not present

## 2016-06-24 DIAGNOSIS — E119 Type 2 diabetes mellitus without complications: Secondary | ICD-10-CM | POA: Diagnosis not present

## 2016-06-24 DIAGNOSIS — J189 Pneumonia, unspecified organism: Secondary | ICD-10-CM | POA: Diagnosis not present

## 2016-06-29 DIAGNOSIS — I482 Chronic atrial fibrillation: Secondary | ICD-10-CM | POA: Diagnosis not present

## 2016-06-29 DIAGNOSIS — I2510 Atherosclerotic heart disease of native coronary artery without angina pectoris: Secondary | ICD-10-CM | POA: Diagnosis not present

## 2016-06-29 DIAGNOSIS — E119 Type 2 diabetes mellitus without complications: Secondary | ICD-10-CM | POA: Diagnosis not present

## 2016-06-29 DIAGNOSIS — R270 Ataxia, unspecified: Secondary | ICD-10-CM | POA: Diagnosis not present

## 2016-06-29 DIAGNOSIS — J189 Pneumonia, unspecified organism: Secondary | ICD-10-CM | POA: Diagnosis not present

## 2016-06-29 DIAGNOSIS — N400 Enlarged prostate without lower urinary tract symptoms: Secondary | ICD-10-CM | POA: Diagnosis not present

## 2016-07-01 DIAGNOSIS — J189 Pneumonia, unspecified organism: Secondary | ICD-10-CM | POA: Diagnosis not present

## 2016-07-01 DIAGNOSIS — I2510 Atherosclerotic heart disease of native coronary artery without angina pectoris: Secondary | ICD-10-CM | POA: Diagnosis not present

## 2016-07-01 DIAGNOSIS — R270 Ataxia, unspecified: Secondary | ICD-10-CM | POA: Diagnosis not present

## 2016-07-01 DIAGNOSIS — E119 Type 2 diabetes mellitus without complications: Secondary | ICD-10-CM | POA: Diagnosis not present

## 2016-07-01 DIAGNOSIS — N400 Enlarged prostate without lower urinary tract symptoms: Secondary | ICD-10-CM | POA: Diagnosis not present

## 2016-07-01 DIAGNOSIS — I482 Chronic atrial fibrillation: Secondary | ICD-10-CM | POA: Diagnosis not present

## 2016-07-02 DIAGNOSIS — I482 Chronic atrial fibrillation: Secondary | ICD-10-CM | POA: Diagnosis not present

## 2016-07-02 DIAGNOSIS — E119 Type 2 diabetes mellitus without complications: Secondary | ICD-10-CM | POA: Diagnosis not present

## 2016-07-02 DIAGNOSIS — R270 Ataxia, unspecified: Secondary | ICD-10-CM | POA: Diagnosis not present

## 2016-07-02 DIAGNOSIS — J189 Pneumonia, unspecified organism: Secondary | ICD-10-CM | POA: Diagnosis not present

## 2016-07-02 DIAGNOSIS — I2510 Atherosclerotic heart disease of native coronary artery without angina pectoris: Secondary | ICD-10-CM | POA: Diagnosis not present

## 2016-07-02 DIAGNOSIS — N400 Enlarged prostate without lower urinary tract symptoms: Secondary | ICD-10-CM | POA: Diagnosis not present

## 2016-07-05 ENCOUNTER — Encounter: Payer: Self-pay | Admitting: Cardiology

## 2016-07-06 DIAGNOSIS — E119 Type 2 diabetes mellitus without complications: Secondary | ICD-10-CM | POA: Diagnosis not present

## 2016-07-06 DIAGNOSIS — J189 Pneumonia, unspecified organism: Secondary | ICD-10-CM | POA: Diagnosis not present

## 2016-07-06 DIAGNOSIS — N400 Enlarged prostate without lower urinary tract symptoms: Secondary | ICD-10-CM | POA: Diagnosis not present

## 2016-07-06 DIAGNOSIS — I2510 Atherosclerotic heart disease of native coronary artery without angina pectoris: Secondary | ICD-10-CM | POA: Diagnosis not present

## 2016-07-06 DIAGNOSIS — I482 Chronic atrial fibrillation: Secondary | ICD-10-CM | POA: Diagnosis not present

## 2016-07-06 DIAGNOSIS — R270 Ataxia, unspecified: Secondary | ICD-10-CM | POA: Diagnosis not present

## 2016-07-08 DIAGNOSIS — I2510 Atherosclerotic heart disease of native coronary artery without angina pectoris: Secondary | ICD-10-CM | POA: Diagnosis not present

## 2016-07-08 DIAGNOSIS — N400 Enlarged prostate without lower urinary tract symptoms: Secondary | ICD-10-CM | POA: Diagnosis not present

## 2016-07-08 DIAGNOSIS — J189 Pneumonia, unspecified organism: Secondary | ICD-10-CM | POA: Diagnosis not present

## 2016-07-08 DIAGNOSIS — E119 Type 2 diabetes mellitus without complications: Secondary | ICD-10-CM | POA: Diagnosis not present

## 2016-07-08 DIAGNOSIS — I482 Chronic atrial fibrillation: Secondary | ICD-10-CM | POA: Diagnosis not present

## 2016-07-08 DIAGNOSIS — R270 Ataxia, unspecified: Secondary | ICD-10-CM | POA: Diagnosis not present

## 2016-07-09 DIAGNOSIS — Z794 Long term (current) use of insulin: Secondary | ICD-10-CM | POA: Diagnosis not present

## 2016-07-09 DIAGNOSIS — I10 Essential (primary) hypertension: Secondary | ICD-10-CM | POA: Diagnosis not present

## 2016-07-09 DIAGNOSIS — I2510 Atherosclerotic heart disease of native coronary artery without angina pectoris: Secondary | ICD-10-CM | POA: Diagnosis not present

## 2016-07-09 DIAGNOSIS — I482 Chronic atrial fibrillation: Secondary | ICD-10-CM | POA: Diagnosis not present

## 2016-07-09 DIAGNOSIS — I6340 Cerebral infarction due to embolism of unspecified cerebral artery: Secondary | ICD-10-CM | POA: Diagnosis not present

## 2016-07-09 DIAGNOSIS — E782 Mixed hyperlipidemia: Secondary | ICD-10-CM | POA: Diagnosis not present

## 2016-07-09 DIAGNOSIS — E1159 Type 2 diabetes mellitus with other circulatory complications: Secondary | ICD-10-CM | POA: Diagnosis not present

## 2016-07-09 DIAGNOSIS — E1169 Type 2 diabetes mellitus with other specified complication: Secondary | ICD-10-CM | POA: Diagnosis not present

## 2016-07-09 DIAGNOSIS — E1165 Type 2 diabetes mellitus with hyperglycemia: Secondary | ICD-10-CM | POA: Diagnosis not present

## 2016-07-13 DIAGNOSIS — M47812 Spondylosis without myelopathy or radiculopathy, cervical region: Secondary | ICD-10-CM | POA: Diagnosis not present

## 2016-07-13 DIAGNOSIS — I2510 Atherosclerotic heart disease of native coronary artery without angina pectoris: Secondary | ICD-10-CM | POA: Diagnosis not present

## 2016-07-18 NOTE — Progress Notes (Signed)
[name] Date of Birth: Nov 12, 1928   History of Present Illness: [name] is seen for followup atrial fibrillation and CAD.  He  Has a history of chronic atrial fibrillation managed with rate control and anticoagulation with coumadin. He also has a history of CAD s/p CABG in 1990. Echo in 2013 showed normal LV function. Very mild AS.  biatrial enlargement. In October 2016 he had an abnormal [place] which showed inferior wall ischemia. He underwent cardiac cath. This showed occlusion of the proximal LAD and a critical stenosis of the mid RCA. The SVG to diagonal was occluded. The LIMA to the LAD was patent. He had stenting of the mid RCA with a DES. He is on Plavix.  In January 2017 he had a severe nosebleed. He had packing but it did not stop. Subsequently had cautery by Dr. Redmond Baseman. No recurrent bleeding since then.   On a prior visit his HR was poorly controlled and his Toprol dose was increased with improvement. He also complained of leg pain. LE arterial dopplers were normal.   He was admitted in early April 2018 with acute vertigo and a fall. He was found on MRI to have a left parietal infarct. Head CT was negative but did show a 60% right carotid stenosis. INR was 1.8. He was switched from Coumadin to Eliquis. Echo showed mild aortic stenosis, normal LV function. No source of emboli. Zocor was switched to Lipitor and lantus dose was increased. He was DC with home PT. He has since been started on Jardiance.   On follow up today he states he is doing OK. His ataxia has improved. He has lost 16 lbs. He had some diarrhea on metformin but this resolved with dose reduction. Hedenies any significant palpitations, chest pain, or shortness of breath. He had a recent flair of cervicalgia but this has resolved with tramadol and Tylenol.   Current Outpatient Prescriptions on File Prior to Visit  Medication Sig Dispense Refill  . apixaban (ELIQUIS) 5 MG TABS tablet Take 1 tablet (5 mg  total) by mouth 2 (two) times daily. 60 tablet 2  . atorvastatin (LIPITOR) 40 MG tablet Take 1 tablet (40 mg total) by mouth daily at 6 PM. 30 tablet 2  . AVODART 0.5 MG capsule Take 0.5 mg by mouth every other day.     . diltiazem (CARDIZEM CD) 360 MG 24 hr capsule Take 1 capsule (360 mg total) by mouth daily. 30 capsule 6  . empagliflozin (JARDIANCE) 10 MG TABS tablet Take 10 mg by mouth daily.    . insulin glargine (LANTUS) 100 UNIT/ML injection 30 units in am ,10 units in am 10 mL 11  . metoprolol succinate (TOPROL-XL) 100 MG 24 hr tablet Take 1 tablet (100 mg total) by mouth 2 (two) times daily. 180 tablet 3  . nitroGLYCERIN (NITROSTAT) 0.4 MG SL tablet Place 0.4 mg under the tongue every 5 (five) minutes as needed for chest pain (x 3 doses).    . pantoprazole (PROTONIX) 40 MG tablet Take 1 tablet by mouth daily at Noon 90 tablet 3  . Tamsulosin HCl (FLOMAX) 0.4 MG CAPS Take 0.4 mg by mouth every other day.      No current facility-administered medications on file prior to visit.     Allergies  Allergen Reactions  . Erythromycin Base   . Amoxicillin Other (See Comments)    Unknown  . Cefadroxil Other (See Comments)    Unknown  . Ciprofloxacin Other (See Comments)  Unknown  . Erythromycin Other (See Comments)    Upset stomach    Past Medical History:  Diagnosis Date  . Abdominal pain   . Acute renal failure ([place])     resolved  . Arthritis    "hands & legs" (11/'10/2014)  . Ataxia   . Atrial fibrillation ([place])   . Bladder outlet obstruction   . Bladder outlet obstruction   . BPH (benign prostatic hyperplasia)   . CAD (coronary artery disease)    a. CABG IN 1989. b. 01/08/2015 CTO of ost LAD, LIMA to LAD not visualized but assumed patent given myoview finding, occluded SVG to diagonal, 99% mid RCA tx w/ SYNERGY DES 3X28 mm  . Constipation   . Diabetes mellitus type 2, insulin dependent ([place])   . Epistaxis, recurrent Feb 2017  . GERD (gastroesophageal reflux disease)   .  HTN (hypertension)   . Hyperlipemia   . Kidney stones   . Mild aortic stenosis   . Rib fractures    left rib fractures being treated with pain medications  . Stented coronary artery Nov 2016   RCA DES  . Urinary tract infection     Enterococcus    Past Surgical History:  Procedure Laterality Date  . CARDIAC CATHETERIZATION  1989  . CARDIAC CATHETERIZATION N/A 01/08/2015   Procedure: Left Heart Cath and Cors/Grafts Angiography;  Surgeon: Peter M Martinique, MD;  Location: [place];  Service: Cardiovascular;  Laterality: N/A;  . CARDIAC CATHETERIZATION  01/08/2015   Procedure: Coronary Stent Intervention;  Surgeon: Peter M Martinique, MD;  Location: [facility];  Service: Cardiovascular;;  . CARPAL TUNNEL RELEASE Right 08/2010  . CATARACT EXTRACTION W/ INTRAOCULAR LENS  IMPLANT, BILATERAL Bilateral   . CORONARY ANGIOPLASTY  01/08/15   RCA DES  . CORONARY ARTERY BYPASS GRAFT  1989   "CABG X 2"  . JOINT REPLACEMENT    . TONSILLECTOMY    . TOTAL HIP ARTHROPLASTY Right 2000    History  Smoking Status  . Former Smoker  . Packs/day: 3.00  . Years: 10.00  . Types: Cigarettes  . Quit date: 05/11/1958  Smokeless Tobacco  . Never Used    History  Alcohol Use No    Family History  Problem Relation Age of Onset  . Brain cancer Father   . Throat cancer Brother     Review of Systems: As noted in history of present illness.  All other systems were reviewed and are negative.  Physical Exam: BP (!) 120/58   Pulse 62   Ht 5\' 6"  (1.676 m)   Wt 208 lb 9.6 oz (94.6 kg)   BMI 33.67 kg/m  He is a pleasant elderly white male who is obese. HEENT exam is unremarkable. Sclera are clear. PERRLA. Oropharynx is clear. Neck is supple no JVD, adenopathy, thyromegaly, or bruits. Lungs are clear. Cardiac exam reveals a grade 1/6 systolic ejection murmur right upper sternal border. His pulse is irregular. Old median sternotomy scar. Abdomen is obese, soft, nontender. He has no edema. Feet  are warm and dry. Pedal pulses are reduced.  He is alert and oriented x3. Cranial nerves II through XII are intact. Skin is warm and dry.  LABORATORY DATA:  Lab Results  Component Value Date   WBC 9.1 06/08/2016   HGB 12.0 (L) 06/08/2016   HCT 37.6 (L) 06/08/2016   PLT 177 06/08/2016   GLUCOSE 117 (H) 06/08/2016   CHOL 178 06/07/2016   TRIG 133 06/07/2016  HDL 45 06/07/2016   LDLCALC 106 (H) 06/07/2016   ALT 20 06/06/2016   AST 36 06/06/2016   NA 141 06/08/2016   K 3.3 (L) 06/08/2016   CL 105 06/08/2016   CREATININE 0.90 06/08/2016   BUN 11 06/08/2016   CO2 26 06/08/2016   PSA 106.83 Test Methodology: [employer]britech PSA (H) 11/09/2009   INR 1.90 06/07/2016   HGBA1C 9.0 (H) 06/02/2016    Echo 06/08/16: Study Conclusions  - Left ventricle: The cavity size was normal. There was mild   concentric hypertrophy. Systolic function was vigorous. The   estimated ejection fraction was in the range of 65% to 70%. Wall   motion was normal; there were no regional wall motion   abnormalities. The study is not technically sufficient to allow   evaluation of LV diastolic function. - Aortic valve: Trileaflet; moderately thickened, moderately   calcified leaflets. Valve mobility was restricted. There was mild   stenosis. There was trivial regurgitation. Peak velocity (S): 254   cm/s. Mean gradient (S): 12 mm Hg. Valve area (VTI): 1.59 cm^2. - Mitral valve: Calcified annulus. Mildly thickened leaflets .   There was mild regurgitation. - Left atrium: The atrium was mildly dilated. - Tricuspid valve: There was mild regurgitation. - Pulmonary arteries: Systolic pressure was mildly increased. PA   peak pressure: 33 mm Hg (S).  Impressions:  - Aortic stenosis has progressed since prior echocardiogram.   Assessment / Plan: 1. Chronic atrial fibrillation. Rate is well controlled on   Toprol XL to 100 mg bid and Cardizem CD 360 mg daily.   Now on Eliquis. Asymptomatic.   2. CAD s/p remote  CABG 25 years ago. S/p DES of RCA 01/08/15.   3. Left parietal CVA with ataxia. Now on Eliquis. Stressed importance of risk factor modification.  4. HTN - well controlled.   5. DM type 2- on insulin. Now on Jardiance as well.   6. Hyperlipidemia. Now on lipitor 40 mg. Needs repeat lab work in 2 months with primary care. If still not at goal I would increase lipitor to 80 mg daily and consider Zetia.   7. Neck pain due to cervical spondylosis.   I will follow up in 4 months.

## 2016-07-20 ENCOUNTER — Ambulatory Visit (INDEPENDENT_AMBULATORY_CARE_PROVIDER_SITE_OTHER): Payer: Medicare Other | Admitting: Cardiology

## 2016-07-20 ENCOUNTER — Encounter: Payer: Self-pay | Admitting: Cardiology

## 2016-07-20 VITALS — BP 120/58 | HR 62 | Ht 66.0 in | Wt 208.6 lb

## 2016-07-20 DIAGNOSIS — I25708 Atherosclerosis of coronary artery bypass graft(s), unspecified, with other forms of angina pectoris: Secondary | ICD-10-CM

## 2016-07-20 DIAGNOSIS — I2510 Atherosclerotic heart disease of native coronary artery without angina pectoris: Secondary | ICD-10-CM | POA: Diagnosis not present

## 2016-07-20 DIAGNOSIS — E7800 Pure hypercholesterolemia, unspecified: Secondary | ICD-10-CM

## 2016-07-20 DIAGNOSIS — I4821 Permanent atrial fibrillation: Secondary | ICD-10-CM

## 2016-07-20 DIAGNOSIS — I482 Chronic atrial fibrillation: Secondary | ICD-10-CM | POA: Diagnosis not present

## 2016-07-20 DIAGNOSIS — I209 Angina pectoris, unspecified: Secondary | ICD-10-CM

## 2016-07-20 DIAGNOSIS — I10 Essential (primary) hypertension: Secondary | ICD-10-CM | POA: Diagnosis not present

## 2016-07-20 DIAGNOSIS — Z7901 Long term (current) use of anticoagulants: Secondary | ICD-10-CM | POA: Diagnosis not present

## 2016-07-20 NOTE — Patient Instructions (Signed)
Continue your current therapy  I will see you in 4 months  

## 2016-07-27 ENCOUNTER — Telehealth: Payer: Self-pay | Admitting: *Deleted

## 2016-07-27 NOTE — Telephone Encounter (Signed)
The patient stated that he watches his heart rate closely when walking on the treadmill. He stated that it will go anywhere from 110-150's. When he was at cardiac rehab, he stated that they would make him stop therapy and rest when his heart rate would go over 100. He would like to know what a safe heart rate range would be for him when walking on the treadmill.

## 2016-07-27 NOTE — Telephone Encounter (Signed)
Returned call to patient Dr.Jordan's advice given. 

## 2016-07-27 NOTE — Telephone Encounter (Signed)
I think as long as his HR is less than 150 when exercising this is fine.  Nicolasa Milbrath Martinique MD, [facility]

## 2016-07-28 ENCOUNTER — Ambulatory Visit (INDEPENDENT_AMBULATORY_CARE_PROVIDER_SITE_OTHER): Payer: Medicare Other | Admitting: Podiatry

## 2016-07-28 ENCOUNTER — Encounter: Payer: Self-pay | Admitting: Podiatry

## 2016-07-28 DIAGNOSIS — M79674 Pain in right toe(s): Secondary | ICD-10-CM | POA: Diagnosis not present

## 2016-07-28 DIAGNOSIS — B351 Tinea unguium: Secondary | ICD-10-CM

## 2016-07-28 DIAGNOSIS — E119 Type 2 diabetes mellitus without complications: Secondary | ICD-10-CM

## 2016-07-28 NOTE — Progress Notes (Signed)
Patient ID: [name], male   DOB: 12/16/1928, 83 y.o.   MRN: [id number] Complaint:  Visit Type: Patient returns to my office for continued preventative foot care services. Complaint: Patient states" my nails have grown long and thick and become painful to walk and wear shoes" Patient has been diagnosed with DM with no foot  complications. He presents for preventative foot care services. No changes to ROS  Podiatric Exam: Vascular: dorsalis pedis and posterior tibial pulses are palpable bilateral. Capillary return is immediate. Temperature gradient is WNL. Skin turgor WNL  Sensorium: Normal Semmes Weinstein monofilament test. Normal tactile sensation bilaterally. Nail Exam: Pt has thick disfigured discolored nails with subungual debris noted bilateral entire nail hallux through fifth toenails Ulcer Exam: There is no evidence of ulcer or pre-ulcerative changes or infection. Orthopedic Exam: Muscle tone and strength are WNL. No limitations in general ROM. No crepitus or effusions noted. Foot type and digits show no abnormalities. Bony prominences are unremarkable. Skin: No Porokeratosis. No infection or ulcers  Diagnosis:  Tinea unguium, Pain in right toe, pain in left toes  Treatment & Plan Procedures and Treatment: Consent by patient was obtained for treatment procedures. The patient understood the discussion of treatment and procedures well. All questions were answered thoroughly reviewed. Debridement of mycotic and hypertrophic toenails, 1 through 5 bilateral and clearing of subungual debris. No ulceration, no infection noted.  Return Visit-Office Procedure: Patient instructed to return to the office for a follow up visit 3 months for continued evaluation and treatment.    Brandom Kerwin DPM 

## 2016-08-26 DIAGNOSIS — N400 Enlarged prostate without lower urinary tract symptoms: Secondary | ICD-10-CM | POA: Diagnosis not present

## 2016-09-13 ENCOUNTER — Telehealth: Payer: Self-pay | Admitting: Cardiology

## 2016-09-13 NOTE — Telephone Encounter (Signed)
Spoke to patient Dr.Jordan's advice given. 

## 2016-09-13 NOTE — Telephone Encounter (Signed)
New message ° ° ° °Pt is calling asking for a call back. He did not say what it was about.  °

## 2016-09-13 NOTE — Telephone Encounter (Signed)
His LE dopplers were normal. Leg numbness may be related to nerve impingement - would recommend he follow up with primary MD since this does not appear to be a vascular problem.  Graciella Arment [place] MD, [facility]

## 2016-09-13 NOTE — Telephone Encounter (Signed)
Pt of Dr. Martinique  Returned patient call to discuss issue. He explains he had a "test done on the leg a couple of months ago" (refers to the lower extremity arterial study from December 2017) d/t leg numbness in left leg. Explains that it hasn't really gotten any better, in fact may be a little worse. He's seeking recommendations on next steps. Denies pain, swelling, etc in leg - only reported symptom is numbness.  Pt aware I'll seek Dr. Doug Sou review and return his call w any advice.

## 2016-09-17 DIAGNOSIS — I2510 Atherosclerotic heart disease of native coronary artery without angina pectoris: Secondary | ICD-10-CM | POA: Diagnosis not present

## 2016-09-17 DIAGNOSIS — E1142 Type 2 diabetes mellitus with diabetic polyneuropathy: Secondary | ICD-10-CM | POA: Diagnosis not present

## 2016-10-27 ENCOUNTER — Ambulatory Visit (INDEPENDENT_AMBULATORY_CARE_PROVIDER_SITE_OTHER): Payer: Medicare Other | Admitting: Podiatry

## 2016-10-27 DIAGNOSIS — B351 Tinea unguium: Secondary | ICD-10-CM

## 2016-10-27 DIAGNOSIS — M79674 Pain in right toe(s): Secondary | ICD-10-CM

## 2016-10-27 DIAGNOSIS — E119 Type 2 diabetes mellitus without complications: Secondary | ICD-10-CM

## 2016-10-27 NOTE — Progress Notes (Signed)
Patient ID: [name], male   DOB: [birth year], 83 y.o.   MRN: [id number] Complaint:  Visit Type: Patient returns to my office for continued preventative foot care services. Complaint: Patient states" my nails have grown long and thick and become painful to walk and wear shoes" Patient has been diagnosed with DM with no foot  complications. He presents for preventative foot care services. No changes to ROS  Podiatric Exam: Vascular: dorsalis pedis and posterior tibial pulses are palpable bilateral. Capillary return is immediate. Temperature gradient is WNL. Skin turgor WNL  Sensorium: Normal Semmes Weinstein monofilament test. Normal tactile sensation bilaterally. Nail Exam: Pt has thick disfigured discolored nails with subungual debris noted bilateral entire nail hallux through fifth toenails Ulcer Exam: There is no evidence of ulcer or pre-ulcerative changes or infection. Orthopedic Exam: Muscle tone and strength are WNL. No limitations in general ROM. No crepitus or effusions noted. Foot type and digits show no abnormalities. Bony prominences are unremarkable. Skin: No Porokeratosis. No infection or ulcers  Diagnosis:  Tinea unguium, Pain in right toe, pain in left toes  Treatment & Plan Procedures and Treatment: Consent by patient was obtained for treatment procedures. The patient understood the discussion of treatment and procedures well. All questions were answered thoroughly reviewed. Debridement of mycotic and hypertrophic toenails, 1 through 5 bilateral and clearing of subungual debris. No ulceration, no infection noted.  Return Visit-Office Procedure: Patient instructed to return to the office for a follow up visit 3 months for continued evaluation and treatment.    Gardiner Barefoot DPM

## 2016-11-17 ENCOUNTER — Telehealth: Payer: Self-pay | Admitting: Cardiology

## 2016-11-17 NOTE — Telephone Encounter (Signed)
New Message  Pt call requesting to speak with Rn about his carotid scheduled on 10/4. Pt would like to know if Dr. Martinique thinks he should take the test. Please call back to discuss

## 2016-11-17 NOTE — Telephone Encounter (Signed)
Returned call to patient.He stated he received a letter from Dr.Field's office to schedule carotid dopplers.Stated carotid dopplers scheduled 12/02/16 at Dr.Field's office.Stated he don't remember seeing Dr.Fields when he was in hospital this past April.Stated he wanted to make sure ok with Dr.Jordan.Message sent to [place].

## 2016-11-17 NOTE — Telephone Encounter (Signed)
Returned call to patient Dr.Jordan's recommendations given. 

## 2016-11-17 NOTE — Telephone Encounter (Signed)
Dr. [name] did see him in the hospital in April this year. He had a 60% carotid stenosis and follow up doppler recommended at 6 months. I think he should have it done.  Peter Martinique MD, [facility]

## 2016-11-18 DIAGNOSIS — E119 Type 2 diabetes mellitus without complications: Secondary | ICD-10-CM | POA: Diagnosis not present

## 2016-11-18 DIAGNOSIS — H1132 Conjunctival hemorrhage, left eye: Secondary | ICD-10-CM | POA: Diagnosis not present

## 2016-11-18 DIAGNOSIS — H348312 Tributary (branch) retinal vein occlusion, right eye, stable: Secondary | ICD-10-CM | POA: Diagnosis not present

## 2016-11-19 NOTE — Progress Notes (Signed)
[name] Date of Birth: Feb 05, 1929   History of Present Illness: [name] is seen for followup atrial fibrillation and CAD.  He  Has a history of chronic atrial fibrillation managed with rate control and anticoagulation with coumadin. He also has a history of CAD s/p CABG in 1990. Echo in 2013 showed normal LV function. Very mild AS.  biatrial enlargement. In October 2016 he had an abnormal [place] which showed inferior wall ischemia. He underwent cardiac cath. This showed occlusion of the proximal LAD and a critical stenosis of the mid RCA. The SVG to diagonal was occluded. The LIMA to the LAD was patent. He had stenting of the mid RCA with a DES. He is on Plavix.  In January 2017 he had a severe nosebleed. He had packing but it did not stop. Subsequently had cautery by Dr. Redmond Baseman. No recurrent bleeding since then.   On a prior visit his HR was poorly controlled and his Toprol dose was increased with improvement. He also complained of leg pain. LE arterial dopplers were normal.   He was admitted in early April 2018 with acute vertigo and a fall. He was found on MRI to have a left parietal infarct. Head CT was negative but did show a 60% right carotid stenosis. INR was 1.8. He was switched from Coumadin to Eliquis. Echo showed mild aortic stenosis, normal LV function. No source of emboli. Zocor was switched to Lipitor and lantus dose was increased. He was DC with home PT. He has since been started on Jardiance.   On follow up today he states he is doing well. His ataxia has improved but he still notes some dizziness.  He denies any significant palpitations, chest pain, or shortness of breath. No recurrent TIA or CVA symptoms. Reports A1c at the [place] was down to 7.6%. Lipids have not been rechecked. He is scheduled for follow up carotid dopplers next week with Dr. [name].  Current Outpatient Prescriptions on File Prior to Visit  Medication Sig Dispense Refill  . apixaban (ELIQUIS)  5 MG TABS tablet Take 1 tablet (5 mg total) by mouth 2 (two) times daily. 60 tablet 2  . atorvastatin (LIPITOR) 40 MG tablet Take 1 tablet (40 mg total) by mouth daily at 6 PM. 30 tablet 2  . AVODART 0.5 MG capsule Take 0.5 mg by mouth every other day.     . diltiazem (CARDIZEM CD) 360 MG 24 hr capsule Take 1 capsule (360 mg total) by mouth daily. 30 capsule 6  . empagliflozin (JARDIANCE) 10 MG TABS tablet Take 10 mg by mouth daily.    . insulin glargine (LANTUS) 100 UNIT/ML injection 30 units in am ,10 units in am 10 mL 11  . metFORMIN (GLUCOPHAGE) 500 MG tablet Take 500 mg by mouth 2 (two) times daily with a meal.    . metoprolol succinate (TOPROL-XL) 100 MG 24 hr tablet Take 1 tablet (100 mg total) by mouth 2 (two) times daily. 180 tablet 3  . nitroGLYCERIN (NITROSTAT) 0.4 MG SL tablet Place 0.4 mg under the tongue every 5 (five) minutes as needed for chest pain (x 3 doses).    . pantoprazole (PROTONIX) 40 MG tablet Take 1 tablet by mouth daily at Noon 90 tablet 3  . Tamsulosin HCl (FLOMAX) 0.4 MG CAPS Take 0.4 mg by mouth every other day.      No current facility-administered medications on file prior to visit.     Allergies  Allergen Reactions  . Erythromycin  Base   . Amoxicillin Other (See Comments)    Unknown  . Cefadroxil Other (See Comments)    Unknown  . Ciprofloxacin Other (See Comments)    Unknown  . Erythromycin Other (See Comments)    Upset stomach    Past Medical History:  Diagnosis Date  . Abdominal pain   . Acute renal failure ([place])     resolved  . Arthritis    "hands & legs" (11/'10/2014)  . Ataxia   . Atrial fibrillation ([place])   . Bladder outlet obstruction   . Bladder outlet obstruction   . BPH (benign prostatic hyperplasia)   . CAD (coronary artery disease)    a. CABG IN 1989. b. 01/08/2015 CTO of ost LAD, LIMA to LAD not visualized but assumed patent given myoview finding, occluded SVG to diagonal, 99% mid RCA tx w/ SYNERGY DES 3X28 mm  . Constipation    . Diabetes mellitus type 2, insulin dependent ([place])   . Epistaxis, recurrent Feb 2017  . GERD (gastroesophageal reflux disease)   . HTN (hypertension)   . Hyperlipemia   . Kidney stones   . Mild aortic stenosis   . Rib fractures    left rib fractures being treated with pain medications  . Stented coronary artery Nov 2016   RCA DES  . Urinary tract infection     Enterococcus    Past Surgical History:  Procedure Laterality Date  . CARDIAC CATHETERIZATION  1989  . CARDIAC CATHETERIZATION N/A 01/08/2015   Procedure: Left Heart Cath and Cors/Grafts Angiography;  Surgeon:  M Martinique, MD;  Location: [facility];  Service: Cardiovascular;  Laterality: N/A;  . CARDIAC CATHETERIZATION  01/08/2015   Procedure: Coronary Stent Intervention;  Surgeon:  M Martinique, MD;  Location: [place] [facility];  Service: Cardiovascular;;  . CARPAL TUNNEL RELEASE Right 08/2010  . CATARACT EXTRACTION W/ INTRAOCULAR LENS  IMPLANT, BILATERAL Bilateral   . CORONARY ANGIOPLASTY  01/08/15   RCA DES  . CORONARY ARTERY BYPASS GRAFT  1989   "CABG X 2"  . JOINT REPLACEMENT    . TONSILLECTOMY    . TOTAL HIP ARTHROPLASTY Right 2000    History  Smoking Status  . Former Smoker  . Packs/day: 3.00  . Years: 10.00  . Types: Cigarettes  . Quit date: 05/11/1958  Smokeless Tobacco  . Never Used    History  Alcohol Use No    Family History  Problem Relation Age of Onset  . Brain cancer Father   . Throat cancer Brother     Review of Systems: As noted in history of present illness.  All other systems were reviewed and are negative.  Physical Exam: BP 124/84   Pulse 62   Ht 5\' 7"  (1.702 m)   Wt 210 lb (95.3 kg)   BMI 32.89 kg/m  GENERAL:  Well appearing, obese WM in NAD HEENT:  PERRL, EOMI, sclera are clear. Oropharynx is clear. NECK:  No jugular venous distention, carotid upstroke brisk and symmetric, no bruits, no thyromegaly or adenopathy LUNGS:  Clear to auscultation bilaterally CHEST:   Unremarkable, old median sternotomy scar.  HEART:  IRRR,  PMI not displaced or sustained,S1 and S2 within normal limits, no S3, no S4: no clicks, no rubs. Soft 1/6 SEM.  ABD:  Soft, nontender. BS +, no masses or bruits. No hepatomegaly, no splenomegaly EXT:  2 + pulses throughout, no edema, no cyanosis no clubbing SKIN:  Warm and dry.  No rashes NEURO:  Alert  and oriented x 3. Cranial nerves II through XII intact. PSYCH:  Cognitively intact    LABORATORY DATA:  Lab Results  Component Value Date   WBC 9.1 06/08/2016   HGB 12.0 (L) 06/08/2016   HCT 37.6 (L) 06/08/2016   PLT 177 06/08/2016   GLUCOSE 117 (H) 06/08/2016   CHOL 178 06/07/2016   TRIG 133 06/07/2016   HDL 45 06/07/2016   LDLCALC 106 (H) 06/07/2016   ALT 20 06/06/2016   AST 36 06/06/2016   NA 141 06/08/2016   K 3.3 (L) 06/08/2016   CL 105 06/08/2016   CREATININE 0.90 06/08/2016   BUN 11 06/08/2016   CO2 26 06/08/2016   PSA 106.83 Test Methodology: Hybritech PSA (H) 11/09/2009   INR 1.90 06/07/2016   HGBA1C 9.0 (H) 06/02/2016    Echo 06/08/16: Study Conclusions  - Left ventricle: The cavity size was normal. There was mild   concentric hypertrophy. Systolic function was vigorous. The   estimated ejection fraction was in the range of 65% to 70%. Wall   motion was normal; there were no regional wall motion   abnormalities. The study is not technically sufficient to allow   evaluation of LV diastolic function. - Aortic valve: Trileaflet; moderately thickened, moderately   calcified leaflets. Valve mobility was restricted. There was mild   stenosis. There was trivial regurgitation. Peak velocity (S): 254   cm/s. Mean gradient (S): 12 mm Hg. Valve area (VTI): 1.59 cm^2. - Mitral valve: Calcified annulus. Mildly thickened leaflets .   There was mild regurgitation. - Left atrium: The atrium was mildly dilated. - Tricuspid valve: There was mild regurgitation. - Pulmonary arteries: Systolic pressure was mildly  increased. PA   peak pressure: 33 mm Hg (S).  Impressions:  - Aortic stenosis has progressed since prior echocardiogram.   Assessment / Plan: 1. Chronic atrial fibrillation. Rate is well controlled on   Toprol XL to 100 mg bid and Cardizem CD 360 mg daily.   On Eliquis for anticoagulation. Asymptomatic. Encourage increased aerobic activity.  2. CAD s/p remote CABG 25 years ago. S/p DES of RCA 01/08/15. He remains asymptomatic.    3. Left parietal CVA with ataxia. Now on Eliquis. Stressed importance of risk factor modification.  4. HTN - well controlled.   5. DM type 2- on insulin, metformin,and  Jardiance. Reports improved A1c.    6. Hyperlipidemia. On lipitor 40 mg. Last LDL 106. [facility].  If still not at goal I would increase lipitor to 80 mg daily and consider Zetia.   7. Neck pain due to cervical spondylosis. Stable.  8. Carotid arterial disease. 69% LICA stenosis noted in hospital in April. Scheduled for follow up dopplers with Dr. [name].   I will follow up in 6 months.

## 2016-11-24 ENCOUNTER — Other Ambulatory Visit: Payer: Self-pay

## 2016-11-24 DIAGNOSIS — I6529 Occlusion and stenosis of unspecified carotid artery: Secondary | ICD-10-CM

## 2016-11-25 ENCOUNTER — Ambulatory Visit (INDEPENDENT_AMBULATORY_CARE_PROVIDER_SITE_OTHER): Payer: Medicare Other | Admitting: Cardiology

## 2016-11-25 ENCOUNTER — Encounter: Payer: Self-pay | Admitting: Cardiology

## 2016-11-25 VITALS — BP 124/84 | HR 62 | Ht 67.0 in | Wt 210.0 lb

## 2016-11-25 DIAGNOSIS — E7800 Pure hypercholesterolemia, unspecified: Secondary | ICD-10-CM | POA: Diagnosis not present

## 2016-11-25 DIAGNOSIS — I10 Essential (primary) hypertension: Secondary | ICD-10-CM | POA: Diagnosis not present

## 2016-11-25 DIAGNOSIS — I25708 Atherosclerosis of coronary artery bypass graft(s), unspecified, with other forms of angina pectoris: Secondary | ICD-10-CM

## 2016-11-25 DIAGNOSIS — I482 Chronic atrial fibrillation: Secondary | ICD-10-CM

## 2016-11-25 DIAGNOSIS — Z9861 Coronary angioplasty status: Secondary | ICD-10-CM | POA: Diagnosis not present

## 2016-11-25 DIAGNOSIS — I209 Angina pectoris, unspecified: Secondary | ICD-10-CM

## 2016-11-25 DIAGNOSIS — I4821 Permanent atrial fibrillation: Secondary | ICD-10-CM

## 2016-11-25 DIAGNOSIS — I2510 Atherosclerotic heart disease of native coronary artery without angina pectoris: Secondary | ICD-10-CM

## 2016-11-25 NOTE — Patient Instructions (Signed)
You need to have your lipid panel repeated to look at your cholesterol. This can be done at the [place] (but bring me a copy) or with Dr. Jonni Sanger  I will see you in 6 months

## 2016-12-02 ENCOUNTER — Ambulatory Visit (HOSPITAL_COMMUNITY)
Admission: RE | Admit: 2016-12-02 | Discharge: 2016-12-02 | Disposition: A | Discharge status: Home / Self Care | Payer: Medicare Other | Source: Ambulatory Visit | Attending: Family | Admitting: Family

## 2016-12-02 ENCOUNTER — Encounter: Payer: Self-pay | Admitting: Family

## 2016-12-02 ENCOUNTER — Ambulatory Visit (INDEPENDENT_AMBULATORY_CARE_PROVIDER_SITE_OTHER): Payer: Medicare Other | Admitting: Family

## 2016-12-02 VITALS — BP 143/88 | HR 86 | Temp 97.3°F | Resp 20 | Ht 67.0 in | Wt 211.0 lb

## 2016-12-02 DIAGNOSIS — I2510 Atherosclerotic heart disease of native coronary artery without angina pectoris: Secondary | ICD-10-CM | POA: Diagnosis not present

## 2016-12-02 DIAGNOSIS — I6529 Occlusion and stenosis of unspecified carotid artery: Secondary | ICD-10-CM | POA: Diagnosis not present

## 2016-12-02 DIAGNOSIS — I6523 Occlusion and stenosis of bilateral carotid arteries: Secondary | ICD-10-CM | POA: Diagnosis not present

## 2016-12-02 LAB — VAS US CAROTID
LEFT ECA DIAS: -6 cm/s
Left CCA dist dias: 9 cm/s
Left CCA dist sys: 25 cm/s
Left CCA prox dias: 9 cm/s
Left CCA prox sys: 53 cm/s
Left ICA dist dias: -20 cm/s
Left ICA dist sys: -56 cm/s
Left ICA prox dias: -8 cm/s
Left ICA prox sys: -29 cm/s
RIGHT CCA MID DIAS: 13 cm/s
Right CCA prox dias: 13 cm/s
Right CCA prox sys: 49 cm/s
Right cca dist sys: -42 cm/s

## 2016-12-02 NOTE — Progress Notes (Signed)
Chief Complaint: Follow up Extracranial Carotid Artery Stenosis   History of Present Illness  [name] is a 83 y.o. male whom Dr. [name] consulted on for vascular surgery on 06-02-16  while pt was hospitalized with several day history of dizziness.  Recent MRI at that time showed no evidence of stroke.  Seen by his primary care physician and referred to Neurology.  He has a history of vertigo but states these symptoms are different.  His vertigo usually gives him a spinning room sensation.  He states this feels more like a "cloud over his head".  He has a history of afib but has not really noted any palpitations recently.  His symptoms are worse if he is lying flat in bed or when he goes to sit or stand up.  He reports no sick contacts.  He denies anorexia or decreased intake of fluids.  He has no prior history of stroke.  He denies TIA or amaurosis.  He is on warfarin for his afib. Other medical problems include diabetes, CAD, hypertension, hyperlipidemia, aortic stenosis.   These have all been stable.  At the time of the intital consult by Dr. [name], CTA neck head images showed 60% right ICA stenosis but calcified so CT can overestimate this.  No real left stenosis.  Vertebrals were patent. MRI brain images reviewed no acute stroke  Pt with dizziness.  Has left ICA stenosis 60%.  Dizziness rarely caused by carotid vertebral disease and this is usually in the presence of multi vessel > 80% stenosis. Pt also found to be orthostatic. Needs follow up carotid duplex 6 months. Pt returns today for 6 months follow up.   Pt states he has vertigo, states he has rehab for this every 6 months which helps temporarily.    He states his feet feel numb, denies pain in his feet. He sees a Company secretary.   He denies claudication sx's with walking.  Pt states Dr. Martinique requested ABI's, done 02-13-16, [place] DP bilaterally, bilateral peroneal and PT ABI were normal, bilateral TBI were  normal. (review of records)  Patient has not had previous carotid artery intervention.  He denies any known history of stroke or TIA. Specifically he denies a history of amaurosis fugax or monocular blindness, unilateral facial drooping, hemiplegia, or receptive or expressive aphasia.    Pt Diabetic: yes, states his last A1C was 7.6, [facility]  Pt smoker: former smoker, quit in 1964, smoked x 12 year  Pt meds include: Statin : yes ASA: no Other anticoagulants/antiplatelets: Eliquis for atrial fib   Past Medical History:  Diagnosis Date  . Abdominal pain   . Acute renal failure ([place])     resolved  . Arthritis    "hands & legs" (11/'10/2014)  . Ataxia   . Atrial fibrillation ([place])   . Bladder outlet obstruction   . Bladder outlet obstruction   . BPH (benign prostatic hyperplasia)   . CAD (coronary artery disease)    a. CABG IN 1989. b. 01/08/2015 CTO of ost LAD, LIMA to LAD not visualized but assumed patent given myoview finding, occluded SVG to diagonal, 99% mid RCA tx w/ SYNERGY DES 3X28 mm  . Constipation   . Diabetes mellitus type 2, insulin dependent ([place])   . Epistaxis, recurrent Feb 2017  . GERD (gastroesophageal reflux disease)   . HTN (hypertension)   . Hyperlipemia   . Kidney stones   . Mild aortic stenosis   . Rib  fractures    left rib fractures being treated with pain medications  . Stented coronary artery Nov 2016   RCA DES  . Urinary tract infection     Enterococcus    Social History Social History  Substance Use Topics  . Smoking status: Former Smoker    Packs/day: 3.00    Years: 10.00    Types: Cigarettes    Quit date: 05/11/1958  . Smokeless tobacco: Never Used  . Alcohol use No    Family History Family History  Problem Relation Age of Onset  . Brain cancer Father   . Throat cancer Brother     Surgical History Past Surgical History:  Procedure Laterality Date  . CARDIAC CATHETERIZATION  1989  . CARDIAC  CATHETERIZATION N/A 01/08/2015   Procedure: Left Heart Cath and Cors/Grafts Angiography;  Surgeon: Peter M Martinique, MD;  Location: [place];  Service: Cardiovascular;  Laterality: N/A;  . CARDIAC CATHETERIZATION  01/08/2015   Procedure: Coronary Stent Intervention;  Surgeon: Peter M Martinique, MD;  Location: [place] [facility];  Service: Cardiovascular;;  . CARPAL TUNNEL RELEASE Right 08/2010  . CATARACT EXTRACTION W/ INTRAOCULAR LENS  IMPLANT, BILATERAL Bilateral   . CORONARY ANGIOPLASTY  01/08/15   RCA DES  . CORONARY ARTERY BYPASS GRAFT  1989   "CABG X 2"  . JOINT REPLACEMENT    . TONSILLECTOMY    . TOTAL HIP ARTHROPLASTY Right 2000    Allergies  Allergen Reactions  . Erythromycin Base   . Amoxicillin Other (See Comments)    Unknown  . Cefadroxil Other (See Comments)    Unknown  . Ciprofloxacin Other (See Comments)    Unknown  . Erythromycin Other (See Comments)    Upset stomach    Current Outpatient Prescriptions  Medication Sig Dispense Refill  . apixaban (ELIQUIS) 5 MG TABS tablet Take 1 tablet (5 mg total) by mouth 2 (two) times daily. 60 tablet 2  . atorvastatin (LIPITOR) 40 MG tablet Take 1 tablet (40 mg total) by mouth daily at 6 PM. 30 tablet 2  . AVODART 0.5 MG capsule Take 0.5 mg by mouth every other day.     . diltiazem (CARDIZEM CD) 360 MG 24 hr capsule Take 1 capsule (360 mg total) by mouth daily. 30 capsule 6  . empagliflozin (JARDIANCE) 10 MG TABS tablet Take 10 mg by mouth daily.    . insulin glargine (LANTUS) 100 UNIT/ML injection 30 units in am ,10 units in am 10 mL 11  . metFORMIN (GLUCOPHAGE) 500 MG tablet Take 500 mg by mouth 2 (two) times daily with a meal.    . metoprolol succinate (TOPROL-XL) 100 MG 24 hr tablet Take 1 tablet (100 mg total) by mouth 2 (two) times daily. 180 tablet 3  . nitroGLYCERIN (NITROSTAT) 0.4 MG SL tablet Place 0.4 mg under the tongue every 5 (five) minutes as needed for chest pain (x 3 doses).    . pantoprazole (PROTONIX) 40  MG tablet Take 1 tablet by mouth daily at Noon 90 tablet 3  . Tamsulosin HCl (FLOMAX) 0.4 MG CAPS Take 0.4 mg by mouth every other day.      No current facility-administered medications for this visit.     Review of Systems : See HPI for pertinent positives and negatives.  Physical Examination  Vitals:   12/02/16 1109 12/02/16 1111  BP: (!) 153/85 (!) 143/88  Pulse: 86   Resp: 20   Temp: (!) 97.3 F (36.3 C)   TempSrc:  Oral   SpO2: 98%   Weight: 211 lb (95.7 kg)   Height: 5\' 7"  (1.702 m)    Body mass index is 33.05 kg/m.  General: WDWN male in NAD GAIT: normal Eyes: PERRLA Pulmonary:  Respirations are non-labored, good air movement, CTAB, no rales, rhonchi, or wheezing.  Cardiac: Irregular rhythm, no detected murmur.  VASCULAR EXAM Carotid Bruits Right Left   Negative Negative     Abdominal aortic pulse is not palpable. Radial pulses are 2+ palpable and equal.                                                                                                                            LE Pulses Right Left       POPLITEAL  not palpable   not palpable       POSTERIOR TIBIAL   palpable   palpable        DORSALIS PEDIS      ANTERIOR TIBIAL  palpable   palpable     Gastrointestinal: soft, nontender, BS WNL, no r/g, no palpable masses.  Musculoskeletal: No muscle atrophy/wasting. M/S 5/5 throughout, extremities without ischemic changes.  Neurologic:  A&O X 3; appropriate affect, sensation is normal; speech is normal, CN 2-12 intact, pain and light touch intact in extremities, motor exam as listed above.    Assessment: [name] is a 83 y.o. male who presents with no history of stroke. He does have vertigo.   DATA Carotid Duplex (12/02/16); Right ICA: 1-39% stenosis. Left ICA: 1-39% stenosis. Bilateral vertebral artery flow is antegrade.  Bilateral subclavian artery waveforms are normal.  No prior duplex exam available for comparison.     Plan: Follow-up in 1 year with Carotid Duplex scan.   I discussed in depth with the patient the nature of atherosclerosis, and emphasized the importance of maximal medical management including strict control of blood pressure, blood glucose, and lipid levels, obtaining regular exercise, and continued cessation of smoking.  The patient is aware that without maximal medical management the underlying atherosclerotic disease process will progress, limiting the benefit of any interventions. The patient was given information about stroke prevention and what symptoms should prompt the patient to seek immediate medical care. Thank you for allowing [place] to participate in this patient's care.  Clemon Chambers, RN, MSN, FNP-C [facility] Office: [phone]  Clinic Physician: [name]  12/02/16 11:17 AM

## 2016-12-02 NOTE — Patient Instructions (Signed)
Stroke Prevention Some medical conditions and behaviors are associated with an increased chance of having a stroke. You may prevent a stroke by making healthy choices and managing medical conditions. How can I reduce my risk of having a stroke?  Stay physically active. Get at least 30 minutes of activity on most or all days.  Do not smoke. It may also be helpful to avoid exposure to secondhand smoke.  Limit alcohol use. Moderate alcohol use is considered to be: ? No more than 2 drinks per day for men. ? No more than 1 drink per day for nonpregnant women.  Eat healthy foods. This involves: ? Eating 5 or more servings of fruits and vegetables a day. ? Making dietary changes that address high blood pressure (hypertension), high cholesterol, diabetes, or obesity.  Manage your cholesterol levels. ? Making food choices that are high in fiber and low in saturated fat, trans fat, and cholesterol may control cholesterol levels. ? Take any prescribed medicines to control cholesterol as directed by your health care provider.  Manage your diabetes. ? Controlling your carbohydrate and sugar intake is recommended to manage diabetes. ? Take any prescribed medicines to control diabetes as directed by your health care provider.  Control your hypertension. ? Making food choices that are low in salt (sodium), saturated fat, trans fat, and cholesterol is recommended to manage hypertension. ? Ask your health care provider if you need treatment to lower your blood pressure. Take any prescribed medicines to control hypertension as directed by your health care provider. ? If you are 18-39 years of age, have your blood pressure checked every 3-5 years. If you are 40 years of age or older, have your blood pressure checked every year.  Maintain a healthy weight. ? Reducing calorie intake and making food choices that are low in sodium, saturated fat, trans fat, and cholesterol are recommended to manage  weight.  Stop drug abuse.  Avoid taking birth control pills. ? Talk to your health care provider about the risks of taking birth control pills if you are over 35 years old, smoke, get migraines, or have ever had a blood clot.  Get evaluated for sleep disorders (sleep apnea). ? Talk to your health care provider about getting a sleep evaluation if you snore a lot or have excessive sleepiness.  Take medicines only as directed by your health care provider. ? For some people, aspirin or blood thinners (anticoagulants) are helpful in reducing the risk of forming abnormal blood clots that can lead to stroke. If you have the irregular heart rhythm of atrial fibrillation, you should be on a blood thinner unless there is a good reason you cannot take them. ? Understand all your medicine instructions.  Make sure that other conditions (such as anemia or atherosclerosis) are addressed. Get help right away if:  You have sudden weakness or numbness of the face, arm, or leg, especially on one side of the body.  Your face or eyelid droops to one side.  You have sudden confusion.  You have trouble speaking (aphasia) or understanding.  You have sudden trouble seeing in one or both eyes.  You have sudden trouble walking.  You have dizziness.  You have a loss of balance or coordination.  You have a sudden, severe headache with no known cause.  You have new chest pain or an irregular heartbeat. Any of these symptoms may represent a serious problem that is an emergency. Do not wait to see if the symptoms will go away.   Get medical help at once. Call your local emergency services (911 in [place] Do not drive yourself to the hospital. This information is not intended to replace advice given to you by your health care provider. Make sure you discuss any questions you have with your health care provider. Document Released: 03/25/2004 Document Revised: 07/24/2015 Document Reviewed: 08/18/2012 Elsevier  Interactive Patient Education  2017 Elsevier Inc.     Preventing Cerebrovascular Disease Arteries are blood vessels that carry blood that contains oxygen from the heart to all parts of the body. Cerebrovascular disease affects arteries that supply the brain. Any condition that blocks or disrupts blood flow to the brain can cause cerebrovascular disease. Brain cells that lose blood supply start to die within minutes (stroke). Stroke is the main danger of cerebrovascular disease. Atherosclerosis and high blood pressure are common causes of cerebrovascular disease. Atherosclerosis is narrowing and hardening of an artery that results when fat, cholesterol, calcium, or other substances (plaque) build up inside an artery. Plaque reduces blood flow through the artery. High blood pressure increases the risk of bleeding inside the brain. Making diet and lifestyle changes to prevent atherosclerosis and high blood pressure lowers your risk of cerebrovascular disease. What nutrition changes can be made?  Eat more fruits, vegetables, and whole grains.  Reduce how much saturated fat you eat. To do this, eat less red meat and fewer full-fat dairy products.  Eat healthy proteins instead of red meat. Healthy proteins include: ? Fish. Eat fish that contains heart-healthy omega-3 fatty acids, twice a week. Examples include salmon, albacore tuna, mackerel, and herring. ? Chicken. ? Nuts. ? Low-fat or nonfat yogurt.  Avoid processed meats, like bacon and lunchmeat.  Avoid foods that contain: ? A lot of sugar, such as sweets and drinks with added sugar. ? A lot of salt (sodium). Avoid adding extra salt to your food, as told by your health care provider. ? Trans fats, such as margarine and baked goods. Trans fats may be listed as "partially hydrogenated oils" on food labels.  Check food labels to see how much sodium, sugar, and trans fats are in foods.  Use vegetable oils that contain low amounts of  saturated fat, such as olive oil or canola oil. What lifestyle changes can be made?  Drink alcohol in moderation. This means no more than 1 drink a day for nonpregnant women and 2 drinks a day for men. One drink equals 12 oz of beer, 5 oz of wine, or 1 oz of hard liquor.  If you are overweight, ask your health care provider to recommend a weight-loss plan for you. Losing 5-10 lb (2.2-4.5 kg) can reduce your risk of diabetes, atherosclerosis, and high blood pressure.  Exercise for 30?60 minutes on most days, or as much as told by your health care provider. ? Do moderate-intensity exercise, such as brisk walking, bicycling, and water aerobics. Ask your health care provider which activities are safe for you.  Do not use any products that contain nicotine or tobacco, such as cigarettes and e-cigarettes. If you need help quitting, ask your health care provider. Why are these changes important? Making these changes lowers your risk of many diseases that can cause cerebrovascular disease and stroke. Stroke is a leading cause of death and disability. Making these changes also improves your overall health and quality of life. What can I do to lower my risk? The following factors make you more likely to develop cerebrovascular disease:  Being overweight.  Smoking.  Being physically inactive.    Eating a high-fat diet.  Having certain health conditions, such as: ? Diabetes. ? High blood pressure. ? Heart disease. ? Atherosclerosis. ? High cholesterol. ? Sickle cell disease.  Talk with your health care provider about your risk for cerebrovascular disease. Work with your health care provider to control diseases that you have that may contribute to cerebrovascular disease. Your health care provider may prescribe medicines to help prevent major causes of cerebrovascular disease. Where to find more information: Learn more about preventing cerebrovascular disease from:  National Heart, Lung, and  Blood Institute: www.nhlbi.nih.gov/health/health-topics/topics/stroke  Centers for Disease Control and Prevention: cdc.gov/stroke/about.htm  Summary  Cerebrovascular disease can lead to a stroke.  Atherosclerosis and high blood pressure are major causes of cerebrovascular disease.  Making diet and lifestyle changes can reduce your risk of cerebrovascular disease.  Work with your health care provider to get your risk factors under control to reduce your risk of cerebrovascular disease. This information is not intended to replace advice given to you by your health care provider. Make sure you discuss any questions you have with your health care provider. Document Released: 03/02/2015 Document Revised: 09/05/2015 Document Reviewed: 03/02/2015 Elsevier Interactive Patient Education  2018 Elsevier Inc.  

## 2016-12-11 ENCOUNTER — Other Ambulatory Visit: Payer: Self-pay | Admitting: Physician Assistant

## 2016-12-13 NOTE — Telephone Encounter (Signed)
REFILL 

## 2016-12-13 NOTE — Telephone Encounter (Signed)
Please review for refill. Thanks!  

## 2016-12-24 DIAGNOSIS — H40013 Open angle with borderline findings, low risk, bilateral: Secondary | ICD-10-CM | POA: Diagnosis not present

## 2017-01-11 LAB — LIPID PANEL: LDL Cholesterol: 61

## 2017-01-11 LAB — HEMOGLOBIN A1C: Hemoglobin A1C: 7.7

## 2017-01-26 ENCOUNTER — Encounter: Payer: Self-pay | Admitting: Podiatry

## 2017-01-26 ENCOUNTER — Ambulatory Visit (INDEPENDENT_AMBULATORY_CARE_PROVIDER_SITE_OTHER): Payer: Medicare Other | Admitting: Podiatry

## 2017-01-26 DIAGNOSIS — B351 Tinea unguium: Secondary | ICD-10-CM | POA: Diagnosis not present

## 2017-01-26 DIAGNOSIS — D689 Coagulation defect, unspecified: Secondary | ICD-10-CM | POA: Diagnosis not present

## 2017-01-26 DIAGNOSIS — E119 Type 2 diabetes mellitus without complications: Secondary | ICD-10-CM | POA: Diagnosis not present

## 2017-01-26 DIAGNOSIS — M79674 Pain in right toe(s): Secondary | ICD-10-CM

## 2017-01-26 NOTE — Progress Notes (Addendum)
Patient ID: [name], male   DOB: 1928/08/01, 83 y.o.   MRN: [id number] Complaint:  Visit Type: Patient returns to my office for continued preventative foot care services. Complaint: Patient states" my nails have grown long and thick and become painful to walk and wear shoes" Patient has been diagnosed with DM with no foot  complications. He presents for preventative foot care services. No changes to ROS.  Patient is on eliuiss.  Podiatric Exam: Vascular: dorsalis pedis and posterior tibial pulses are palpable bilateral. Capillary return is immediate. Temperature gradient is WNL. Skin turgor WNL  Sensorium: Normal Semmes Weinstein monofilament test. Normal tactile sensation bilaterally. Nail Exam: Pt has thick disfigured discolored nails with subungual debris noted bilateral entire nail hallux through fifth toenails Ulcer Exam: There is no evidence of ulcer or pre-ulcerative changes or infection. Orthopedic Exam: Muscle tone and strength are WNL. No limitations in general ROM. No crepitus or effusions noted. Foot type and digits show no abnormalities. Bony prominences are unremarkable. Skin: No Porokeratosis. No infection or ulcers  Diagnosis:  Tinea unguium, Pain in right toe, pain in left toes  Treatment & Plan Procedures and Treatment: Consent by patient was obtained for treatment procedures. The patient understood the discussion of treatment and procedures well. All questions were answered thoroughly reviewed. Debridement of mycotic and hypertrophic toenails, 1 through 5 bilateral and clearing of subungual debris. No ulceration, no infection noted. ABN signed for 2018. Return Visit-Office Procedure: Patient instructed to return to the office for a follow up visit 3 months for continued evaluation and treatment.    Gardiner Barefoot DPM

## 2017-01-26 NOTE — Addendum Note (Signed)
Addended by: Lianne Cure A on: 01/26/2017 01:06 PM   Modules accepted: Orders

## 2017-02-14 ENCOUNTER — Other Ambulatory Visit: Payer: Self-pay | Admitting: *Deleted

## 2017-02-16 ENCOUNTER — Ambulatory Visit (INDEPENDENT_AMBULATORY_CARE_PROVIDER_SITE_OTHER): Payer: Medicare Other | Admitting: Family Medicine

## 2017-02-16 ENCOUNTER — Encounter: Payer: Self-pay | Admitting: Family Medicine

## 2017-02-16 VITALS — BP 138/70 | HR 76 | Temp 98.1°F | Ht 67.0 in | Wt 212.5 lb

## 2017-02-16 DIAGNOSIS — I2510 Atherosclerotic heart disease of native coronary artery without angina pectoris: Secondary | ICD-10-CM | POA: Diagnosis not present

## 2017-02-16 DIAGNOSIS — Z794 Long term (current) use of insulin: Secondary | ICD-10-CM

## 2017-02-16 DIAGNOSIS — E782 Mixed hyperlipidemia: Secondary | ICD-10-CM

## 2017-02-16 DIAGNOSIS — E119 Type 2 diabetes mellitus without complications: Secondary | ICD-10-CM | POA: Diagnosis not present

## 2017-02-16 DIAGNOSIS — I482 Chronic atrial fibrillation, unspecified: Secondary | ICD-10-CM

## 2017-02-16 DIAGNOSIS — I10 Essential (primary) hypertension: Secondary | ICD-10-CM

## 2017-02-16 NOTE — Patient Instructions (Signed)
It was so good seeing you again! Thank you for establishing with my new practice and allowing me to continue caring for you. It means a lot to me.   Please schedule a follow up appointment with me in 3 months for diabetes follow up.  Medicare recommends an Annual Wellness Visit for all patients. Please schedule this to be done with our Nurse Educator, Maudie Mercury. This is an informative "talk" visit; it's goals are to ensure that your health care needs are being met and to give you education regarding avoiding falls, ensuring you are not suffering from depression or problems with memory or thinking, and to educate you on Advance Care Planning. It helps me take good care of you!

## 2017-02-16 NOTE — Progress Notes (Signed)
Subjective  CC:  Chief Complaint  Patient presents with  . Establish Care  . Diabetes  . Hypertension    HPI: [name] is a 83 y.o. male who presents to the office today for follow up of diabetes, hypertension and problems listed above in the chief complaint. He is a former [facility] patient and is here to reestablish care with me today. He also coordinates care with MD at [facility]. I have reviewed their recent lab results. He was seen there yesterday.   Diabetic f/u: His diabetic control is reported as Improved.  His most recent A1c was November 13 and was 7.6.  This is much improved from 6 months prior.  He was evaluated yesterday at the [facility] and his insulin dosage was decreased by 2 units at night due to a few hypoglycemic readings, 50-62, without symptoms. He denies exertional CP or SOB or symptomatic hypoglycemia. He denies foot sores.  His peripheral neuropathy numbness is much improved wearing diabetic shoes, no medications.  He denies pain.  Eye exam is up-to-date without retinopathy from diabetes, he does have mild hypertensive retinopathy.  Immunizations are up-to-date.  He had Tdap through the [place] yesterday.   Hypertension f/u: Control is good . Pt reports he is doing well. taking medications as instructed, no medication side effects noted, no TIAs, no chest pain on exertion, no dyspnea on exertion, no swelling of ankles.  He denies adverse effects from his BP medications. Compliance with medication is good.   Hyperlipidemia follow-up: His last LDL was 61 on statin.  He is at goal.  Chronic vertigo: He has been doing well from this standpoint.  No recent exacerbations.  Chronic A. fib on anticoagulation and coronary artery disease have been reportedly stable.  And cardiology visits have been reviewed.   I reviewed the patients updated PMH, FH, and SocHx.  Patient Active Problem List   Diagnosis Date Noted  . Atherosclerotic heart disease of native coronary artery without angina  pectoris 06/13/2016    Priority: High  . CVA (cerebral vascular accident) ([place]) 06/06/2016    Priority: High  . Recurrent coronary arteriosclerosis after percutaneous transluminal coronary angioplasty 04/18/2015    Priority: High  . CAD S/P PCI- Nov 2016     Priority: High  . Current use of long term anticoagulation 02/06/2013    Priority: High  . Chronic atrial fibrillation ([place]) 07/30/2011    Priority: High  . Hx of CABG x 2 1990     Priority: High  . Essential (primary) hypertension     Priority: High  . Diabetes mellitus type 2, insulin dependent ([place])     Priority: High  . Hyperlipemia     Priority: High  . Type 2 diabetes mellitus ([place]) 01/15/2010    Priority: High  . Obesity (BMI 30.0-34.9) 01/31/2012    Priority: Medium  . Gastro-esophageal reflux disease without esophagitis 12/07/2010    Priority: Medium  . Epistaxis, recurrent 04/11/2015    Priority: Low  . Osteoarthritis, knee 01/31/2012    Priority: Low  . Allergic rhinitis 10/28/2011    Priority: Low  . Hearing loss 06/08/2011    Priority: Low  . Primary osteoarthritis of hand 06/08/2011    Priority: Low  . Hematuria 08/27/2008    Priority: Low  . Leukocytosis 06/02/2016  . Carotid stenosis 06/02/2016  . Chronic vertigo 03/15/2014  . Enlarged prostate without lower urinary tract symptoms (luts) 01/04/2011   Immunization History  Administered Date(s) Administered  . Influenza Split 11/14/2007,  01/01/2009, 12/23/2009  . Influenza, High Dose Seasonal PF 11/02/2012, 12/12/2013, 11/15/2014  . Influenza, Seasonal, Injecte, Preservative Fre 11/18/2015  . Influenza,trivalent, recombinat, inj, PF 12/07/2010  . Influenza-Unspecified 10/28/2011, 11/30/2014, 02/15/2017  . Pneumococcal Conjugate-13 12/13/2013  . Pneumococcal-Unspecified 08/20/2008  . Td 05/29/2002  . Tdap 10/28/2011, 02/15/2017  . Zoster 08/20/2008  . Zoster Recombinat (Shingrix) 10/13/2016, 12/13/2016   Health Maintenance  Topic Date Due  .  URINE MICROALBUMIN  05/21/1938  . OPHTHALMOLOGY EXAM  04/19/2017  . HEMOGLOBIN A1C  07/11/2017  . FOOT EXAM  09/17/2017  . TETANUS/TDAP  02/16/2027  . INFLUENZA VACCINE  Completed  . PNA vac Low Risk Adult  Completed   Diabetes and HTN Related Lab Review: Lab Results  Component Value Date   HGBA1C 7.7 01/11/2017   No results found for: Derl Barrow Lab Results  Component Value Date   CREATININE 0.90 06/08/2016   BUN 11 06/08/2016   NA 141 06/08/2016   K 3.3 (L) 06/08/2016   CL 105 06/08/2016   CO2 26 06/08/2016   Lab Results  Component Value Date   CHOL 178 06/07/2016   Lab Results  Component Value Date   HDL 45 06/07/2016   Lab Results  Component Value Date   LDLCALC 61 01/11/2017   LDLCALC 106 (H) 06/07/2016   Lab Results  Component Value Date   TRIG 133 06/07/2016   Lab Results  Component Value Date   CHOLHDL 4.0 06/07/2016    BP Readings from Last 3 Encounters:  02/16/17 138/70  12/02/16 (!) 143/88  11/25/16 124/84    Allergies: Patient is allergic to erythromycin base; amoxicillin; cefadroxil; ciprofloxacin; and erythromycin. Family History: Patient family history includes Brain cancer in his father; Throat cancer in his brother. Social History:  Patient  reports that he quit smoking about 58 years ago. His smoking use included cigarettes. He has a 30.00 pack-year smoking history. he has never used smokeless tobacco. He reports that he does not drink alcohol or use drugs.  Review of Systems: Ophthalmic: negative for eye pain, loss of vision or double vision Cardiovascular: negative for chest pain Respiratory: negative for SOB or persistent cough Gastrointestinal: negative for abdominal pain Genitourinary: negative for dysuria or gross hematuria MSK: negative for foot lesions Neurologic: negative for weakness or new gait disturbance Current Meds  Medication Sig  . apixaban (ELIQUIS) 5 MG TABS tablet Take 1 tablet (5 mg total) by mouth 2  (two) times daily.  [name] atorvastatin (LIPITOR) 40 MG tablet Take 1 tablet (40 mg total) by mouth daily at 6 PM.  . AVODART 0.5 MG capsule Take 0.5 mg by mouth every other day.   . diltiazem (CARDIZEM CD) 360 MG 24 hr capsule Take 1 capsule (360 mg total) by mouth daily.  . empagliflozin (JARDIANCE) 10 MG TABS tablet Take 10 mg by mouth daily.  . insulin glargine (LANTUS) 100 UNIT/ML injection 30 units in am ,10 units in am (Patient taking differently: 38 units in am)  . metFORMIN (GLUCOPHAGE) 500 MG tablet Take 500 mg by mouth 2 (two) times daily with a meal.  . metoprolol succinate (TOPROL-XL) 100 MG 24 hr tablet Take 1 tablet (100 mg total) by mouth 2 (two) times daily.  . nitroGLYCERIN (NITROSTAT) 0.4 MG SL tablet Place 0.4 mg under the tongue every 5 (five) minutes as needed for chest pain (x 3 doses).  . pantoprazole (PROTONIX) 40 MG tablet TAKE 1 TABLET BY MOUTH DAILY AT NOON  . PRECISION XTRA TEST STRIPS test  strip TEST BLOOD SUGAR 1-2 TIMES DAILY  . Tamsulosin HCl (FLOMAX) 0.4 MG CAPS Take 0.4 mg by mouth every other day.     Objective  Vitals: BP 138/70 (BP Location: Left Arm, Patient Position: Sitting, Cuff Size: Large)   Pulse 76   Temp 98.1 F (36.7 C) (Oral)   Ht 5\' 7"  (1.702 m)   Wt 212 lb 8 oz (96.4 kg)   SpO2 97%   BMI 33.28 kg/m  General: well appearing, no acute distress  Psych:  Alert and oriented, normal mood and affect HEENT:  Normocephalic, atraumatic, moist mucous membranes, supple neck  Cardiovascular:  Nl S1 and S2, RRR withmurmur, no gallop or rub. no edema Respiratory:  Good breath sounds bilaterally, CTAB with normal effort, no rales Gastrointestinal: normal BS, soft, nontender Skin:  Warm, no rashes  Assessment  1. Diabetes mellitus type 2, insulin dependent ([place])   2. Essential (primary) hypertension   3. Mixed hyperlipidemia   4. Chronic atrial fibrillation (H[facility])   5. Atherosclerosis of native coronary artery of native heart without angina pectoris        Plan   Diabetes is currently well controlled.  Continue current medical regimen.  Continue checking sugars twice daily.  Continue diabetic diet.  He is doing great  Hypertension is currently well controlled.  No changes in medications today.  Recent renal function and electrolytes are stable.  Hyperlipidemia: Levels are at goal.  Continue statin.  Recent LFTs are normal  Chronic A. fib on anticoagulation is stable.  Coronary artery disease is stable.  Due for annual wellness visit. Diabetic education: ongoing education regarding chronic disease management for diabetes was given today. We continue to reinforce the ABC's of diabetic management: A1c (<7 or 8 dependent upon patient), tight blood pressure control, and cholesterol management with goal LDL < 100 minimally. We discuss diet strategies, exercise recommendations, medication options and possible side effects. At each visit, we review recommended immunizations and preventive care recommendations for diabetics and stress that good diabetic control can prevent other problems. See below for this patient's data. Hypertension education: ongoing education regarding management of these chronic disease states was given. Management strategies discussed on successive visits include dietary and exercise recommendations, goals of achieving and maintaining IBW, and lifestyle modifications aiming for adequate sleep and minimizing stressors.   Follow up: Return in about 3 months (around 05/17/2017) for follow up of diabetes and hypertension..   Commons side effects, risks, benefits, and alternatives for medications and treatment plan prescribed today were discussed, and the patient expressed understanding of the given instructions. Patient is instructed to call or message via MyChart if he/she has any questions or concerns regarding our treatment plan. No barriers to understanding were identified. We discussed Red Flag symptoms and signs in detail.  Patient expressed understanding regarding what to do in case of urgent or emergency type symptoms.   Medication list was reconciled, printed and provided to the patient in AVS. Patient instructions and summary information was reviewed with the patient as documented in the AVS. This note was prepared with assistance of Dragon voice recognition software. Occasional wrong-word or sound-a-like substitutions may have occurred due to the inherent limitations of voice recognition software  Orders Placed This Encounter  Procedures  . Lipid panel  . Hemoglobin A1c   No orders of the defined types were placed in this encounter.

## 2017-04-06 ENCOUNTER — Ambulatory Visit: Payer: Medicare Other | Admitting: Podiatry

## 2017-04-13 ENCOUNTER — Encounter: Payer: Self-pay | Admitting: Podiatry

## 2017-04-13 ENCOUNTER — Ambulatory Visit (INDEPENDENT_AMBULATORY_CARE_PROVIDER_SITE_OTHER): Payer: Medicare Other | Admitting: Podiatry

## 2017-04-13 DIAGNOSIS — D689 Coagulation defect, unspecified: Secondary | ICD-10-CM

## 2017-04-13 DIAGNOSIS — B351 Tinea unguium: Secondary | ICD-10-CM | POA: Diagnosis not present

## 2017-04-13 DIAGNOSIS — M79674 Pain in right toe(s): Secondary | ICD-10-CM

## 2017-04-13 DIAGNOSIS — E119 Type 2 diabetes mellitus without complications: Secondary | ICD-10-CM

## 2017-04-13 NOTE — Progress Notes (Signed)
Patient ID: [name], male   DOB: 10-20-1928, 84 y.o.   MRN: [id number] Complaint:  Visit Type: Patient returns to my office for continued preventative foot care services. Complaint: Patient states" my nails have grown long and thick and become painful to walk and wear shoes" Patient has been diagnosed with DM with no foot  complications. He presents for preventative foot care services. No changes to ROS.  Patient is on eliquiss.  Podiatric Exam: Vascular: dorsalis pedis and posterior tibial pulses are palpable bilateral. Capillary return is immediate. Temperature gradient is WNL. Skin turgor WNL  Sensorium: Normal Semmes Weinstein monofilament test. Normal tactile sensation bilaterally. Nail Exam: Pt has thick disfigured discolored nails with subungual debris noted bilateral entire nail hallux through fifth toenails Ulcer Exam: There is no evidence of ulcer or pre-ulcerative changes or infection. Orthopedic Exam: Muscle tone and strength are WNL. No limitations in general ROM. No crepitus or effusions noted. Foot type and digits show no abnormalities. Bony prominences are unremarkable. Skin: No Porokeratosis. No infection or ulcers  Diagnosis:  Tinea unguium, Pain in right toe, pain in left toes  Treatment & Plan Procedures and Treatment: Consent by patient was obtained for treatment procedures. The patient understood the discussion of treatment and procedures well. All questions were answered thoroughly reviewed. Debridement of mycotic and hypertrophic toenails, 1 through 5 bilateral and clearing of subungual debris. No ulceration, no infection noted. ABN signed for 2019.[name] Return Visit-Office Procedure: Patient instructed to return to the office for a follow up visit 3 months for continued evaluation and treatment.    Gardiner Barefoot DPM

## 2017-04-19 DIAGNOSIS — E119 Type 2 diabetes mellitus without complications: Secondary | ICD-10-CM | POA: Diagnosis not present

## 2017-04-19 DIAGNOSIS — H348312 Tributary (branch) retinal vein occlusion, right eye, stable: Secondary | ICD-10-CM | POA: Diagnosis not present

## 2017-04-19 DIAGNOSIS — H35041 Retinal micro-aneurysms, unspecified, right eye: Secondary | ICD-10-CM | POA: Diagnosis not present

## 2017-04-19 DIAGNOSIS — H35351 Cystoid macular degeneration, right eye: Secondary | ICD-10-CM | POA: Diagnosis not present

## 2017-05-04 ENCOUNTER — Ambulatory Visit: Payer: Medicare Other | Admitting: Family Medicine

## 2017-05-05 ENCOUNTER — Encounter: Payer: Self-pay | Admitting: Family Medicine

## 2017-05-05 ENCOUNTER — Ambulatory Visit (INDEPENDENT_AMBULATORY_CARE_PROVIDER_SITE_OTHER): Payer: Medicare Other | Admitting: Family Medicine

## 2017-05-05 ENCOUNTER — Other Ambulatory Visit: Payer: Self-pay

## 2017-05-05 VITALS — BP 147/84 | HR 84 | Temp 98.1°F | Ht 67.0 in | Wt 213.0 lb

## 2017-05-05 DIAGNOSIS — I10 Essential (primary) hypertension: Secondary | ICD-10-CM

## 2017-05-05 DIAGNOSIS — Z794 Long term (current) use of insulin: Secondary | ICD-10-CM

## 2017-05-05 DIAGNOSIS — E1142 Type 2 diabetes mellitus with diabetic polyneuropathy: Secondary | ICD-10-CM

## 2017-05-05 DIAGNOSIS — R42 Dizziness and giddiness: Secondary | ICD-10-CM

## 2017-05-05 DIAGNOSIS — E119 Type 2 diabetes mellitus without complications: Secondary | ICD-10-CM

## 2017-05-05 LAB — POCT GLYCOSYLATED HEMOGLOBIN (HGB A1C): Hemoglobin A1C: 7.7

## 2017-05-05 NOTE — Patient Instructions (Signed)
Please return in 3 months for diabetes and blood pressure.  If you have any questions or concerns, please don't hesitate to send me a message via MyChart or call the office at [phone]. Thank you for visiting with us today! It's our pleasure caring for you.  Medicare recommends an Annual Wellness Visit for all patients. Please schedule this to be done with our Nurse Educator, Kim. This is an informative "talk" visit; it's goals are to ensure that your health care needs are being met and to give you education regarding avoiding falls, ensuring you are not suffering from depression or problems with memory or thinking, and to educate you on Advance Care Planning. It helps me take good care of you!   

## 2017-05-05 NOTE — Progress Notes (Signed)
Subjective  CC:  Chief Complaint  Patient presents with  . Diabetes    HPI: [name] is a 84 y.o. male who presents to the office today for follow up of diabetes and problems listed above in the chief complaint. He is co-managed with the [facility] physicians.  He has no new concerns. Overall is doing very well.  Wt Readings from Last 3 Encounters:  05/05/17 213 lb (96.6 kg)  02/16/17 212 lb 8 oz (96.4 kg)  12/02/16 211 lb (95.7 kg)     Diabetes follow up: His diabetic control is reported as Unchanged. Stable on insulin. No sxs of high or low bs.  He denies exertional CP or SOB or symptomatic hypoglycemia. He denies foot sores. He does have numbness in feet. No pain.   HTN has been well controlled. Outside MD visits: nl readings by report. He is a bit upset today due to his wife's medications. No cp. Has regular f/u with cards. On eloquis. No palpitations or sob or bleeding problems  bppv chronic - improved with daily vertigo exercises. Has been to vestibular rehab multiple times.   Energy is good.   Immunization History  Administered Date(s) Administered  . Influenza Split 11/14/2007, 01/01/2009, 12/23/2009  . Influenza, High Dose Seasonal PF 11/02/2012, 12/12/2013, 11/15/2014  . Influenza, Seasonal, Injecte, Preservative Fre 11/18/2015  . Influenza,trivalent, recombinat, inj, PF 12/07/2010  . Influenza-Unspecified 10/28/2011, 11/30/2014, 02/15/2017  . Pneumococcal Conjugate-13 12/13/2013  . Pneumococcal-Unspecified 08/20/2008  . Td 05/29/2002  . Tdap 10/28/2011, 02/15/2017  . Zoster 08/20/2008  . Zoster Recombinat (Shingrix) 10/13/2016, 12/13/2016    Diabetes Related Lab Review: Lab Results  Component Value Date   HGBA1C 7.7 05/05/2017   No results found for: Derl Barrow Lab Results  Component Value Date   CREATININE 0.90 06/08/2016   BUN 11 06/08/2016   NA 141 06/08/2016   K 3.3 (L) 06/08/2016   CL 105 06/08/2016   CO2 26 06/08/2016   Lab Results    Component Value Date   CHOL 178 06/07/2016   Lab Results  Component Value Date   HDL 45 06/07/2016   Lab Results  Component Value Date   LDLCALC 61 01/11/2017   LDLCALC 106 (H) 06/07/2016   Lab Results  Component Value Date   TRIG 133 06/07/2016   Lab Results  Component Value Date   CHOLHDL 4.0 06/07/2016   No results found for: LDLDIRECT The ASCVD Risk score Mikey Bussing DC Jr., et al., 2013) failed to calculate for the following reasons:   The 2013 ASCVD risk score is only valid for ages 75 to 90   The patient has a prior MI or stroke diagnosis I have reviewed the PMH, Fam and Soc history. Patient Active Problem List   Diagnosis Date Noted  . Diabetic peripheral neuropathy associated with type 2 diabetes mellitus ([place]) 05/05/2017    Priority: High  . Atherosclerotic heart disease of native coronary artery without angina pectoris 06/13/2016    Priority: High  . CVA (cerebral vascular accident) ([place]) 06/06/2016    Priority: High  . Recurrent coronary arteriosclerosis after percutaneous transluminal coronary angioplasty 04/18/2015    Priority: High  . CAD S/P PCI- Nov 2016     Priority: High  . Current use of long term anticoagulation 02/06/2013    Priority: High  . Chronic atrial fibrillation ([place]) 07/30/2011    Priority: High  . Hx of CABG x 2 1990     Priority: High  . Essential (primary) hypertension  Priority: High  . Diabetes mellitus type 2, insulin dependent ([place])     Priority: High  . Hyperlipemia     Priority: High  . Type 2 diabetes mellitus ([place]) 01/15/2010    Priority: High  . Obesity (BMI 30.0-34.9) 01/31/2012    Priority: Medium  . Gastro-esophageal reflux disease without esophagitis 12/07/2010    Priority: Medium  . Epistaxis, recurrent 04/11/2015    Priority: Low  . Osteoarthritis, knee 01/31/2012    Priority: Low  . Allergic rhinitis 10/28/2011    Priority: Low  . Hearing loss 06/08/2011    Priority: Low  . Primary osteoarthritis of hand  06/08/2011    Priority: Low  . Hematuria 08/27/2008    Priority: Low  . Leukocytosis 06/02/2016  . Carotid stenosis 06/02/2016  . Chronic vertigo 03/15/2014  . Enlarged prostate without lower urinary tract symptoms (luts) 01/04/2011    Social History: Patient  reports that he quit smoking about 59 years ago. His smoking use included cigarettes. He has a 30.00 pack-year smoking history. he has never used smokeless tobacco. He reports that he does not drink alcohol or use drugs.  Review of Systems: Ophthalmic: negative for eye pain, loss of vision or double vision Cardiovascular: negative for chest pain Respiratory: negative for SOB or persistent cough Gastrointestinal: negative for abdominal pain Genitourinary: negative for dysuria or gross hematuria MSK: negative for foot lesions Neurologic: negative for weakness or gait disturbance BP Readings from Last 3 Encounters:  05/05/17 (!) 147/84  02/16/17 138/70  12/02/16 (!) 143/88    Objective  Vitals: BP (!) 147/84   Pulse 84   Temp 98.1 F (36.7 C)   Ht 5\' 7"  (1.702 m)   Wt 213 lb (96.6 kg)   BMI 33.36 kg/m  General: well appearing, no acute distress  Psych:  Alert and oriented, normal mood and affect HEENT:  Normocephalic, atraumatic, moist mucous membranes, supple neck  Cardiovascular:  Nl S1 and S2, RRR without murmur, gallop or rub. no edema Respiratory:  Good breath sounds bilaterally, CTAB with normal effort, no rales Gastrointestinal: normal BS, soft, nontender Skin:  Warm, no rashes Neurologic:   Mental status is normal. normal gait Foot exam: no erythema, pallor, or cyanosis visible nl proprioception and sensation to monofilament testing bilaterally, +2 distal pulses bilaterally    Assessment  1. Diabetes mellitus type 2, insulin dependent ([place])   2. Essential (primary) hypertension   3. Chronic vertigo   4. Diabetic peripheral neuropathy associated with type 2 diabetes mellitus ([place])      Plan    Diabetes is currently well controlled. At goal. He is eating better as well due to less appetite.   bp is at goal for him. At risk for falls due to vertigo and age; avoid hypotension. F/u with cards for afib and CAD - managed well now. No med changes.  Diabetic education: ongoing education regarding chronic disease management for diabetes was given today. We continue to reinforce the ABC's of diabetic management: A1c (<7 or 8 dependent upon patient), tight blood pressure control, and cholesterol management with goal LDL < 100 minimally. We discuss diet strategies, exercise recommendations, medication options and possible side effects. At each visit, we review recommended immunizations and preventive care recommendations for diabetics and stress that good diabetic control can prevent other problems. See below for this patient's data.  Follow up: Return in about 3 months (around 08/05/2017) for follow up Diabetes..   Commons side effects, risks, benefits, and alternatives for medications and  treatment plan prescribed today were discussed, and the patient expressed understanding of the given instructions. Patient is instructed to call or message via MyChart if he/she has any questions or concerns regarding our treatment plan. No barriers to understanding were identified. We discussed Red Flag symptoms and signs in detail. Patient expressed understanding regarding what to do in case of urgent or emergency type symptoms.   Medication list was reconciled, printed and provided to the patient in AVS. Patient instructions and summary information was reviewed with the patient as documented in the AVS. This note was prepared with assistance of Dragon voice recognition software. Occasional wrong-word or sound-a-like substitutions may have occurred due to the inherent limitations of voice recognition software  Orders Placed This Encounter  Procedures  . POCT glycosylated hemoglobin (Hb A1C)   No orders of the  defined types were placed in this encounter.

## 2017-05-26 NOTE — Progress Notes (Signed)
[name] Date of Birth: 02/04/1929   History of Present Illness: [name] is seen for followup atrial fibrillation and CAD.  He  Has a history of chronic atrial fibrillation managed with rate control and anticoagulation with coumadin. He also has a history of CAD s/p CABG in 1990. Echo in 2013 showed normal LV function. Very mild AS.  biatrial enlargement. In October 2016 he had an abnormal [place] which showed inferior wall ischemia. He underwent cardiac cath. This showed occlusion of the proximal LAD and a critical stenosis of the mid RCA. The SVG to diagonal was occluded. The LIMA to the LAD was patent. He had stenting of the mid RCA with a DES. He is on Plavix. He was admitted in early April 2018 with acute vertigo and a fall. He was found on MRI to have a left parietal infarct. Head CT was negative but did show a 60% right carotid stenosis. INR was 1.8. He was switched from Coumadin to Eliquis. Echo showed mild aortic stenosis, normal LV function. No source of emboli. Zocor was switched to Lipitor and lantus dose was increased. He was DC with home PT. He has since been started on Jardiance.   On follow up today he states he is doing well. His ataxia has improved. Still does daily vestibular exercises.  He denies any significant palpitations, chest pain, or shortness of breath. No recurrent TIA or CVA symptoms. Labs checked at the [place]. He is sedentary. He had carotid dopplers in October 2018 without significant stenosis.  Current Outpatient Medications on File Prior to Visit  Medication Sig Dispense Refill  . apixaban (ELIQUIS) 5 MG TABS tablet Take 1 tablet (5 mg total) by mouth 2 (two) times daily. 60 tablet 2  . atorvastatin (LIPITOR) 40 MG tablet Take 1 tablet (40 mg total) by mouth daily at 6 PM. 30 tablet 2  . AVODART 0.5 MG capsule Take 0.5 mg by mouth every other day.     . diltiazem (CARDIZEM CD) 360 MG 24 hr capsule Take 1 capsule (360 mg total) by mouth daily. 30  capsule 6  . empagliflozin (JARDIANCE) 10 MG TABS tablet Take 10 mg by mouth daily.    . insulin glargine (LANTUS) 100 UNIT/ML injection Inject 38 Units into the skin at bedtime.    . metFORMIN (GLUCOPHAGE) 500 MG tablet Take 500 mg by mouth 2 (two) times daily with a meal.    . metoprolol succinate (TOPROL-XL) 100 MG 24 hr tablet Take 1 tablet (100 mg total) by mouth 2 (two) times daily. 180 tablet 3  . nitroGLYCERIN (NITROSTAT) 0.4 MG SL tablet Place 0.4 mg under the tongue every 5 (five) minutes as needed for chest pain (x 3 doses).    . pantoprazole (PROTONIX) 40 MG tablet TAKE 1 TABLET BY MOUTH DAILY AT NOON 90 tablet 2  . PRECISION XTRA TEST STRIPS test strip TEST BLOOD SUGAR 1-2 TIMES DAILY  0  . Tamsulosin HCl (FLOMAX) 0.4 MG CAPS Take 0.4 mg by mouth every other day.      No current facility-administered medications on file prior to visit.     Allergies  Allergen Reactions  . Erythromycin Base   . Amoxicillin Other (See Comments)    Unknown  . Cefadroxil Other (See Comments)    Unknown  . Ciprofloxacin Other (See Comments)    Unknown  . Erythromycin Other (See Comments)    Upset stomach    Past Medical History:  Diagnosis Date  . Abdominal  pain   . Acute renal failure ([place])     resolved  . Arthritis    "hands & legs" (11/'10/2014)  . Ataxia   . Atrial fibrillation ([place])   . Bladder outlet obstruction   . Bladder outlet obstruction   . BPH (benign prostatic hyperplasia)   . CAD (coronary artery disease)    a. CABG IN 1989. b. 01/08/2015 CTO of ost LAD, LIMA to LAD not visualized but assumed patent given myoview finding, occluded SVG to diagonal, 99% mid RCA tx w/ SYNERGY DES 3X28 mm  . Constipation   . Diabetes mellitus type 2, insulin dependent ([place])   . Epistaxis, recurrent Feb 2017  . GERD (gastroesophageal reflux disease)   . HTN (hypertension)   . Hyperlipemia   . Kidney stones   . Mild aortic stenosis   . Rib fractures    left rib fractures being treated  with pain medications  . Stented coronary artery Nov 2016   RCA DES  . Urinary tract infection     Enterococcus    Past Surgical History:  Procedure Laterality Date  . CARDIAC CATHETERIZATION  1989  . CARDIAC CATHETERIZATION N/A 01/08/2015   Procedure: Left Heart Cath and Cors/Grafts Angiography;  Surgeon: Peter M Martinique, MD;  Location: [facility];  Service: Cardiovascular;  Laterality: N/A;  . CARDIAC CATHETERIZATION  01/08/2015   Procedure: Coronary Stent Intervention;  Surgeon: Peter M Martinique, MD;  Location: [facility];  Service: Cardiovascular;;  . CARPAL TUNNEL RELEASE Right 08/2010  . CATARACT EXTRACTION W/ INTRAOCULAR LENS  IMPLANT, BILATERAL Bilateral   . CORONARY ANGIOPLASTY  01/08/15   RCA DES  . CORONARY ARTERY BYPASS GRAFT  1989   "CABG X 2"  . JOINT REPLACEMENT    . TONSILLECTOMY    . TOTAL HIP ARTHROPLASTY Right 2000    Social History   Tobacco Use  Smoking Status Former Smoker  . Packs/day: 3.00  . Years: 10.00  . Pack years: 30.00  . Types: Cigarettes  . Last attempt to quit: 05/11/1958  . Years since quitting: 59.0  Smokeless Tobacco Never Used    Social History   Substance and Sexual Activity  Alcohol Use No    Family History  Problem Relation Age of Onset  . Brain cancer Father   . Throat cancer Brother     Review of Systems: As noted in history of present illness.  All other systems were reviewed and are negative.  Physical Exam: BP 136/70 (BP Location: Right Arm, Cuff Size: Normal)   Pulse 93   Ht 5\' 6"  (1.676 m)   Wt 210 lb (95.3 kg)   BMI 33.89 kg/m  GENERAL:  Well appearing, obese WM in NAD HEENT:  PERRL, EOMI, sclera are clear. Oropharynx is clear. NECK:  No jugular venous distention, carotid upstroke brisk and symmetric, no bruits, no thyromegaly or adenopathy LUNGS:  Clear to auscultation bilaterally CHEST:  Unremarkable HEART:  RRR,  PMI not displaced or sustained,S1 and S2 within normal limits, no S3, no S4: no  clicks, no rubs, no murmurs ABD:  Soft, nontender. BS +, no masses or bruits. No hepatomegaly, no splenomegaly EXT:  2 + pulses throughout, no edema, no cyanosis no clubbing SKIN:  Warm and dry.  No rashes NEURO:  Alert and oriented x 3. Cranial nerves II through XII intact. PSYCH:  Cognitively intact      LABORATORY DATA:  Lab Results  Component Value Date   WBC 9.1 06/08/2016  HGB 12.0 (L) 06/08/2016   HCT 37.6 (L) 06/08/2016   PLT 177 06/08/2016   GLUCOSE 117 (H) 06/08/2016   CHOL 178 06/07/2016   TRIG 133 06/07/2016   HDL 45 06/07/2016   LDLCALC 61 01/11/2017   ALT 20 06/06/2016   AST 36 06/06/2016   NA 141 06/08/2016   K 3.3 (L) 06/08/2016   CL 105 06/08/2016   CREATININE 0.90 06/08/2016   BUN 11 06/08/2016   CO2 26 06/08/2016   PSA 106.83 Test Methodology: Hybritech PSA (H) 11/09/2009   INR 1.90 06/07/2016   HGBA1C 7.7 05/05/2017   Labs from [place] dated 01/11/17: cholesterol 132, triglycerides 71, HDL 57, LDL 61. Creatinine 1.13. Other chemistries are normal.  Ecg today shows Afib rate 93. Nonspecific ST T wave changes. No change compared to prior. I have personally reviewed and interpreted this study.  Echo 06/08/16: Study Conclusions  - Left ventricle: The cavity size was normal. There was mild   concentric hypertrophy. Systolic function was vigorous. The   estimated ejection fraction was in the range of 65% to 70%. Wall   motion was normal; there were no regional wall motion   abnormalities. The study is not technically sufficient to allow   evaluation of LV diastolic function. - Aortic valve: Trileaflet; moderately thickened, moderately   calcified leaflets. Valve mobility was restricted. There was mild   stenosis. There was trivial regurgitation. Peak velocity (S): 254   cm/s. Mean gradient (S): 12 mm Hg. Valve area (VTI): 1.59 cm^2. - Mitral valve: Calcified annulus. Mildly thickened leaflets .   There was mild regurgitation. - Left atrium: The atrium  was mildly dilated. - Tricuspid valve: There was mild regurgitation. - Pulmonary arteries: Systolic pressure was mildly increased. PA   peak pressure: 33 mm Hg (S).  Impressions:  - Aortic stenosis has progressed since prior echocardiogram.   Assessment / Plan: 1. Chronic atrial fibrillation. Rate is well controlled on   Toprol XL to 100 mg bid and Cardizem CD 360 mg daily.   On Eliquis for anticoagulation. Asymptomatic. Encourage increased aerobic activity.  2. CAD s/p remote CABG 25 years ago. S/p DES of RCA 01/08/15. He has class 1 angina.  3. Left parietal CVA with ataxia. Now on Eliquis.   4. HTN - well controlled.   5. DM type 2- on insulin, metformin,and  Jardiance. Labs followed at [facility].    6. Hyperlipidemia. On lipitor 40 mg. Last LDL 61. Excellent control.   7. Carotid arterial disease. No change by dopplers in October 2018.    I will follow up in 6 months.

## 2017-05-27 ENCOUNTER — Ambulatory Visit (INDEPENDENT_AMBULATORY_CARE_PROVIDER_SITE_OTHER): Payer: Medicare Other | Admitting: Cardiology

## 2017-05-27 ENCOUNTER — Encounter: Payer: Self-pay | Admitting: Cardiology

## 2017-05-27 VITALS — BP 136/70 | HR 93 | Ht 66.0 in | Wt 210.0 lb

## 2017-05-27 DIAGNOSIS — I482 Chronic atrial fibrillation, unspecified: Secondary | ICD-10-CM

## 2017-05-27 DIAGNOSIS — I10 Essential (primary) hypertension: Secondary | ICD-10-CM | POA: Diagnosis not present

## 2017-05-27 DIAGNOSIS — I6529 Occlusion and stenosis of unspecified carotid artery: Secondary | ICD-10-CM

## 2017-05-27 DIAGNOSIS — I25708 Atherosclerosis of coronary artery bypass graft(s), unspecified, with other forms of angina pectoris: Secondary | ICD-10-CM

## 2017-05-27 NOTE — Patient Instructions (Signed)
Continue your current therapy  I will see you in 6 months.   

## 2017-06-07 NOTE — Progress Notes (Addendum)
Subjective:   Gianlucas Evenson is a 84 y.o. male who presents for Medicare Annual/Subsequent preventive examination.  Review of Systems:  No ROS.  Medicare Wellness Visit. Additional risk factors are reflected in the social history.  Cardiac Risk Factors include: advanced age (>44men, >39 women);diabetes mellitus;male gender;hypertension;dyslipidemia;obesity (BMI >30kg/m2);sedentary lifestyle   Sleep patterns: Sleeps 9 hours.  Home Safety/Smoke Alarms: Feels safe in home. Smoke alarms in place.  Living environment; residence and Firearm Safety: Lives with wife in single story with bonus room upstairs (does not access often).  Seat Belt Safety/Bike Helmet: Wears seat belt.   Male:   CCS-Colonoscopy, pt reports 8 years ago, normal.      PSA-  Lab Results  Component Value Date   PSA 106.83 Test Methodology: [employer] PSA (H) 11/09/2009       Objective:    Vitals: BP 136/74 (BP Location: Left Arm, Patient Position: Sitting, Cuff Size: Normal)   Pulse (!) 50   Ht 5\' 6"  (1.676 m)   Wt 213 lb 9.6 oz (96.9 kg)   SpO2 97%   BMI 34.48 kg/m   Body mass index is 34.48 kg/m.  Advanced Directives 06/08/2017 12/02/2016 06/08/2016 06/02/2016 06/02/2016 09/24/2015 06/23/2015  Does Patient Have a Medical Advance Directive? Yes Yes No Yes No Yes Yes  Type of Advance Directive Living will;Healthcare Power of [place];Living will - [place];Living will - Living will;Healthcare Power of Attorney Living will;Healthcare Power of Attorney  Does patient want to make changes to medical advance directive? - - - No - Patient declined - No - Patient declined No - Patient declined  Copy of Violet in Chart? No - copy requested - - No - copy requested - No - copy requested No - copy requested  Would patient like information on creating a medical advance directive? - - No - Patient declined - - - -    Tobacco Social History   Tobacco Use    Smoking Status Former Smoker  . Packs/day: 3.00  . Years: 10.00  . Pack years: 30.00  . Types: Cigarettes  . Last attempt to quit: 05/11/1958  . Years since quitting: 59.1  Smokeless Tobacco Never Used     Counseling given: Not Answered    Past Medical History:  Diagnosis Date  . Abdominal pain   . Acute renal failure ([place])     resolved  . Arthritis    "hands & legs" (11/'10/2014)  . Ataxia   . Atrial fibrillation ([place])   . Bladder outlet obstruction   . Bladder outlet obstruction   . BPH (benign prostatic hyperplasia)   . CAD (coronary artery disease)    a. CABG IN 1989. b. 01/08/2015 CTO of ost LAD, LIMA to LAD not visualized but assumed patent given myoview finding, occluded SVG to diagonal, 99% mid RCA tx w/ SYNERGY DES 3X28 mm  . Constipation   . Diabetes mellitus type 2, insulin dependent ([place])   . Epistaxis, recurrent Feb 2017  . GERD (gastroesophageal reflux disease)   . HTN (hypertension)   . Hyperlipemia   . Kidney stones   . Mild aortic stenosis   . Rib fractures    left rib fractures being treated with pain medications  . Stented coronary artery Nov 2016   RCA DES  . Urinary tract infection     Enterococcus   Past Surgical History:  Procedure Laterality Date  . CARDIAC CATHETERIZATION  1989  . CARDIAC CATHETERIZATION N/A  01/08/2015   Procedure: Left Heart Cath and Cors/Grafts Angiography;  Surgeon: Peter M Martinique, MD;  Location: [facility];  Service: Cardiovascular;  Laterality: N/A;  . CARDIAC CATHETERIZATION  01/08/2015   Procedure: Coronary Stent Intervention;  Surgeon: Peter M Martinique, MD;  Location: [facility];  Service: Cardiovascular;;  . CARPAL TUNNEL RELEASE Right 08/2010  . CATARACT EXTRACTION W/ INTRAOCULAR LENS  IMPLANT, BILATERAL Bilateral   . CORONARY ANGIOPLASTY  01/08/15   RCA DES  . CORONARY ARTERY BYPASS GRAFT  1989   "CABG X 2"  . JOINT REPLACEMENT    . TONSILLECTOMY    . TOTAL HIP ARTHROPLASTY Right 2000   Family  History  Problem Relation Age of Onset  . Brain cancer Father   . Throat cancer Brother    Social History   Socioeconomic History  . Marital status: Married    Spouse name: Not on file  . Number of children: 3  . Years of education: Not on file  . Highest education level: Not on file  Occupational History  . Occupation: Agricultural consultant  Social Needs  . Financial resource strain: Not on file  . Food insecurity:    Worry: Not on file    Inability: Not on file  . Transportation needs:    Medical: Not on file    Non-medical: Not on file  Tobacco Use  . Smoking status: Former Smoker    Packs/day: 3.00    Years: 10.00    Pack years: 30.00    Types: Cigarettes    Last attempt to quit: 05/11/1958    Years since quitting: 59.1  . Smokeless tobacco: Never Used  Substance and Sexual Activity  . Alcohol use: No  . Drug use: No  . Sexual activity: Not Currently  Lifestyle  . Physical activity:    Days per week: Not on file    Minutes per session: Not on file  . Stress: Not on file  Relationships  . Social connections:    Talks on phone: Not on file    Gets together: Not on file    Attends religious service: Not on file    Active member of club or organization: Not on file    Attends meetings of clubs or organizations: Not on file    Relationship status: Not on file  Other Topics Concern  . Not on file  Social History Narrative  . Not on file    Outpatient Encounter Medications as of 06/08/2017  Medication Sig  . apixaban (ELIQUIS) 5 MG TABS tablet Take 1 tablet (5 mg total) by mouth 2 (two) times daily.  [name] atorvastatin (LIPITOR) 40 MG tablet Take 1 tablet (40 mg total) by mouth daily at 6 PM.  . AVODART 0.5 MG capsule Take 0.5 mg by mouth every other day.   . diltiazem (CARDIZEM CD) 360 MG 24 hr capsule Take 1 capsule (360 mg total) by mouth daily.  . empagliflozin (JARDIANCE) 10 MG TABS tablet Take 10 mg by mouth daily.  . insulin glargine (LANTUS) 100 UNIT/ML  injection Inject 38 Units into the skin at bedtime.  . metFORMIN (GLUCOPHAGE) 500 MG tablet Take 500 mg by mouth 2 (two) times daily with a meal.  . metoprolol succinate (TOPROL-XL) 100 MG 24 hr tablet Take 1 tablet (100 mg total) by mouth 2 (two) times daily.  . nitroGLYCERIN (NITROSTAT) 0.4 MG SL tablet Place 0.4 mg under the tongue every 5 (five) minutes as needed for chest pain (  x 3 doses).  . pantoprazole (PROTONIX) 40 MG tablet TAKE 1 TABLET BY MOUTH DAILY AT NOON  . PRECISION XTRA TEST STRIPS test strip TEST BLOOD SUGAR 1-2 TIMES DAILY  . Tamsulosin HCl (FLOMAX) 0.4 MG CAPS Take 0.4 mg by mouth every other day.    No facility-administered encounter medications on file as of 06/08/2017.     Activities of Daily Living In your present state of health, do you have any difficulty performing the following activities: 06/08/2017  Hearing? Y  Vision? N  Difficulty concentrating or making decisions? N  Walking or climbing stairs? Y  Comment knee pain with steps  Dressing or bathing? N  Doing errands, shopping? N  Preparing Food and eating ? N  Using the Toilet? N  In the past six months, have you accidently leaked urine? N  Do you have problems with loss of bowel control? N  Managing your Medications? N  Managing your Finances? N  Housekeeping or managing your Housekeeping? N  Some recent data might be hidden    Patient Care Team: [name], MD as PCP - General (Family Medicine) Martinique, Peter M, MD as Consulting Physician (Cardiology) Gardiner Barefoot, DPM as Consulting Physician (Podiatry) [name] Jessy Oto, MD as Consulting Physician (Vascular Surgery) Melida Quitter, MD as Consulting Physician (Otolaryngology) Zadie Rhine Clent Demark, MD as Consulting Physician (Ophthalmology)   Assessment:   This is a routine wellness examination for [employer].  Exercise Activities and Dietary recommendations Current Exercise Habits: The patient does not participate in regular exercise at present,  Exercise limited by: orthopedic condition(s)   Diet (meal preparation, eat out, water intake, caffeinated beverages, dairy products, fruits and vegetables): Drinks water, coffee, juice and seltzer.   Breakfast: cereal/fruit; bagel/crm chz; bacon/eggs; waffle Lunch: snack Dinner: Protein and vegetables; meatloaf     Goals    . Patient Stated     Maintain current health by staying active.        Fall Risk Fall Risk  06/08/2017 02/16/2017  Falls in the past year? No No    Depression Screen PHQ 2/9 Scores 06/08/2017 02/16/2017 06/18/2015 02/26/2015  PHQ - 2 Score 0 0 0 0    Cognitive Function MMSE - Mini Mental State Exam 06/08/2017  Orientation to time 5  Orientation to Place 5  Registration 3  Attention/ Calculation 3  Recall 1  Language- name 2 objects 2  Language- repeat 1  Language- follow 3 step command 3  Language- read & follow direction 1  Write a sentence 1  Copy design 1  Total score 26        Immunization History  Administered Date(s) Administered  . Influenza Split 11/14/2007, 01/01/2009, 12/23/2009  . Influenza, High Dose Seasonal PF 11/02/2012, 12/12/2013, 11/15/2014  . Influenza, Seasonal, Injecte, Preservative Fre 11/18/2015  . Influenza,trivalent, recombinat, inj, PF 12/07/2010  . Influenza-Unspecified 10/28/2011, 11/30/2014, 02/15/2017  . Pneumococcal Conjugate-13 12/13/2013  . Pneumococcal-Unspecified 08/20/2008  . Td 05/29/2002  . Tdap 10/28/2011, 02/15/2017  . Zoster 08/20/2008  . Zoster Recombinat (Shingrix) 10/13/2016, 12/13/2016    Screening Tests Health Maintenance  Topic Date Due  . URINE MICROALBUMIN  05/21/1938  . FOOT EXAM  09/17/2017  . INFLUENZA VACCINE  09/29/2017  . HEMOGLOBIN A1C  11/05/2017  . OPHTHALMOLOGY EXAM  04/19/2018  . TETANUS/TDAP  02/16/2027  . PNA vac Low Risk Adult  Completed        Plan:    Continue doing brain stimulating activities (puzzles, reading, adult coloring books, staying active) to  keep memory  sharp.   I have personally reviewed and noted the following in the patient's chart:   . Medical and social history . Use of alcohol, tobacco or illicit drugs  . Current medications and supplements . Functional ability and status . Nutritional status . Physical activity . Advanced directives . List of other physicians . Hospitalizations, surgeries, and ER visits in previous 12 months . Vitals . Screenings to include cognitive, depression, and falls . Referrals and appointments  In addition, I have reviewed and discussed with patient certain preventive protocols, quality metrics, and best practice recommendations. A written personalized care plan for preventive services as well as general preventive health recommendations were provided to patient.     Gerilyn Nestle, RN  06/08/2017   Reviewed RN MWV note in PCP absence. Patient to schedule follow-up with PCP.  Leeanne Rio, [name]

## 2017-06-08 ENCOUNTER — Other Ambulatory Visit: Payer: Self-pay

## 2017-06-08 ENCOUNTER — Ambulatory Visit (INDEPENDENT_AMBULATORY_CARE_PROVIDER_SITE_OTHER): Payer: Medicare Other

## 2017-06-08 VITALS — BP 136/74 | HR 50 | Ht 66.0 in | Wt 213.6 lb

## 2017-06-08 DIAGNOSIS — Z0000 Encounter for general adult medical examination without abnormal findings: Secondary | ICD-10-CM

## 2017-06-08 LAB — HM DIABETES EYE EXAM

## 2017-06-08 NOTE — Patient Instructions (Addendum)
Continue doing brain stimulating activities (puzzles, reading, adult coloring books, staying active) to keep memory sharp.   Health Maintenance, Male A healthy lifestyle and preventive care is important for your health and wellness. Ask your health care provider about what schedule of regular examinations is right for you. What should I know about weight and diet? Eat a Healthy Diet  Eat plenty of vegetables, fruits, whole grains, low-fat dairy products, and lean protein.  Do not eat a lot of foods high in solid fats, added sugars, or salt.  Maintain a Healthy Weight Regular exercise can help you achieve or maintain a healthy weight. You should:  Do at least 150 minutes of exercise each week. The exercise should increase your heart rate and make you sweat (moderate-intensity exercise).  Do strength-training exercises at least twice a week.  Watch Your Levels of Cholesterol and Blood Lipids  Have your blood tested for lipids and cholesterol every 5 years starting at 82 years of age. If you are at high risk for heart disease, you should start having your blood tested when you are 82 years old. You may need to have your cholesterol levels checked more often if: ? Your lipid or cholesterol levels are high. ? You are older than 82 years of age. ? You are at high risk for heart disease.  What should I know about cancer screening? Many types of cancers can be detected early and may often be prevented. Lung Cancer  You should be screened every year for lung cancer if: ? You are a current smoker who has smoked for at least 30 years. ? You are a former smoker who has quit within the past 15 years.  Talk to your health care provider about your screening options, when you should start screening, and how often you should be screened.  Colorectal Cancer  Routine colorectal cancer screening usually begins at 82 years of age and should be repeated every 5-10 years until you are 82 years old. You  may need to be screened more often if early forms of precancerous polyps or small growths are found. Your health care provider may recommend screening at an earlier age if you have risk factors for colon cancer.  Your health care provider may recommend using home test kits to check for hidden blood in the stool.  A small camera at the end of a tube can be used to examine your colon (sigmoidoscopy or colonoscopy). This checks for the earliest forms of colorectal cancer.  Prostate and Testicular Cancer  Depending on your age and overall health, your health care provider may do certain tests to screen for prostate and testicular cancer.  Talk to your health care provider about any symptoms or concerns you have about testicular or prostate cancer.  Skin Cancer  Check your skin from head to toe regularly.  Tell your health care provider about any new moles or changes in moles, especially if: ? There is a change in a mole's size, shape, or color. ? You have a mole that is larger than a pencil eraser.  Always use sunscreen. Apply sunscreen liberally and repeat throughout the day.  Protect yourself by wearing long sleeves, pants, a wide-brimmed hat, and sunglasses when outside.  What should I know about heart disease, diabetes, and high blood pressure?  If you are 18-39 years of age, have your blood pressure checked every 3-5 years. If you are 40 years of age or older, have your blood pressure checked every year. You   should have your blood pressure measured twice-once when you are at a hospital or clinic, and once when you are not at a hospital or clinic. Record the average of the two measurements. To check your blood pressure when you are not at a hospital or clinic, you can use: ? An automated blood pressure machine at a pharmacy. ? A home blood pressure monitor.  Talk to your health care provider about your target blood pressure.  If you are between 45-79 years old, ask your health care  provider if you should take aspirin to prevent heart disease.  Have regular diabetes screenings by checking your fasting blood sugar level. ? If you are at a normal weight and have a low risk for diabetes, have this test once every three years after the age of 45. ? If you are overweight and have a high risk for diabetes, consider being tested at a younger age or more often.  A one-time screening for abdominal aortic aneurysm (AAA) by ultrasound is recommended for men aged 65-75 years who are current or former smokers. What should I know about preventing infection? Hepatitis B If you have a higher risk for hepatitis B, you should be screened for this virus. Talk with your health care provider to find out if you are at risk for hepatitis B infection. Hepatitis C Blood testing is recommended for:  Everyone born from 1945 through 1965.  Anyone with known risk factors for hepatitis C.  Sexually Transmitted Diseases (STDs)  You should be screened each year for STDs including gonorrhea and chlamydia if: ? You are sexually active and are younger than 82 years of age. ? You are older than 82 years of age and your health care provider tells you that you are at risk for this type of infection. ? Your sexual activity has changed since you were last screened and you are at an increased risk for chlamydia or gonorrhea. Ask your health care provider if you are at risk.  Talk with your health care provider about whether you are at high risk of being infected with HIV. Your health care provider may recommend a prescription medicine to help prevent HIV infection.  What else can I do?  Schedule regular health, dental, and eye exams.  Stay current with your vaccines (immunizations).  Do not use any tobacco products, such as cigarettes, chewing tobacco, and e-cigarettes. If you need help quitting, ask your health care provider.  Limit alcohol intake to no more than 2 drinks per day. One drink equals 12  ounces of beer, 5 ounces of wine, or 1 ounces of hard liquor.  Do not use street drugs.  Do not share needles.  Ask your health care provider for help if you need support or information about quitting drugs.  Tell your health care provider if you often feel depressed.  Tell your health care provider if you have ever been abused or do not feel safe at home. This information is not intended to replace advice given to you by your health care provider. Make sure you discuss any questions you have with your health care provider. Document Released: 08/14/2007 Document Revised: 10/15/2015 Document Reviewed: 11/19/2014 Elsevier Interactive Patient Education  2018 [employer] Inc[employer]  

## 2017-06-10 NOTE — Progress Notes (Signed)
I have reviewed the documentation from the recent AWV done by Kim Broome; I agree with the documentation and will follow up on any recommendations or abnormal findings as suggested.  

## 2017-06-14 ENCOUNTER — Encounter: Payer: Self-pay | Admitting: Emergency Medicine

## 2017-06-27 ENCOUNTER — Other Ambulatory Visit: Payer: Self-pay | Admitting: Family Medicine

## 2017-06-27 DIAGNOSIS — H40013 Open angle with borderline findings, low risk, bilateral: Secondary | ICD-10-CM | POA: Diagnosis not present

## 2017-06-27 NOTE — Telephone Encounter (Signed)
Received and reviewed medication refill request.  Request is appropriate and was approved.  Please see medication orders for details.  

## 2017-06-27 NOTE — Telephone Encounter (Signed)
Last OV 05/05/17, Next OV 08/01/17  Last filled by Peter M Martinique, MD on 06/16/15, #[id number] with 3 refills

## 2017-07-11 ENCOUNTER — Encounter: Payer: Self-pay | Admitting: Family Medicine

## 2017-07-11 DIAGNOSIS — Z951 Presence of aortocoronary bypass graft: Secondary | ICD-10-CM | POA: Diagnosis not present

## 2017-07-11 DIAGNOSIS — E119 Type 2 diabetes mellitus without complications: Secondary | ICD-10-CM | POA: Diagnosis not present

## 2017-07-11 DIAGNOSIS — I2510 Atherosclerotic heart disease of native coronary artery without angina pectoris: Secondary | ICD-10-CM | POA: Diagnosis present

## 2017-07-11 DIAGNOSIS — R1110 Vomiting, unspecified: Secondary | ICD-10-CM | POA: Diagnosis not present

## 2017-07-11 DIAGNOSIS — Z7901 Long term (current) use of anticoagulants: Secondary | ICD-10-CM | POA: Diagnosis not present

## 2017-07-11 DIAGNOSIS — J189 Pneumonia, unspecified organism: Secondary | ICD-10-CM | POA: Diagnosis not present

## 2017-07-11 DIAGNOSIS — N400 Enlarged prostate without lower urinary tract symptoms: Secondary | ICD-10-CM | POA: Diagnosis present

## 2017-07-11 DIAGNOSIS — I4891 Unspecified atrial fibrillation: Secondary | ICD-10-CM | POA: Diagnosis not present

## 2017-07-11 DIAGNOSIS — R918 Other nonspecific abnormal finding of lung field: Secondary | ICD-10-CM | POA: Diagnosis not present

## 2017-07-11 DIAGNOSIS — Z96641 Presence of right artificial hip joint: Secondary | ICD-10-CM | POA: Diagnosis present

## 2017-07-11 DIAGNOSIS — Z9861 Coronary angioplasty status: Secondary | ICD-10-CM | POA: Diagnosis not present

## 2017-07-11 DIAGNOSIS — Z955 Presence of coronary angioplasty implant and graft: Secondary | ICD-10-CM | POA: Diagnosis not present

## 2017-07-11 DIAGNOSIS — R6883 Chills (without fever): Secondary | ICD-10-CM | POA: Diagnosis not present

## 2017-07-11 DIAGNOSIS — R079 Chest pain, unspecified: Secondary | ICD-10-CM | POA: Diagnosis not present

## 2017-07-11 DIAGNOSIS — Z87891 Personal history of nicotine dependence: Secondary | ICD-10-CM | POA: Diagnosis not present

## 2017-07-11 DIAGNOSIS — Z794 Long term (current) use of insulin: Secondary | ICD-10-CM | POA: Diagnosis not present

## 2017-07-11 DIAGNOSIS — R509 Fever, unspecified: Secondary | ICD-10-CM | POA: Diagnosis not present

## 2017-07-13 ENCOUNTER — Ambulatory Visit: Payer: Medicare Other | Admitting: Podiatry

## 2017-07-18 ENCOUNTER — Encounter

## 2017-07-18 ENCOUNTER — Ambulatory Visit (INDEPENDENT_AMBULATORY_CARE_PROVIDER_SITE_OTHER): Payer: Medicare Other | Admitting: Family Medicine

## 2017-07-18 ENCOUNTER — Encounter: Payer: Self-pay | Admitting: Family Medicine

## 2017-07-18 ENCOUNTER — Other Ambulatory Visit: Payer: Self-pay

## 2017-07-18 ENCOUNTER — Other Ambulatory Visit: Payer: Self-pay | Admitting: Emergency Medicine

## 2017-07-18 ENCOUNTER — Ambulatory Visit (INDEPENDENT_AMBULATORY_CARE_PROVIDER_SITE_OTHER): Payer: Medicare Other

## 2017-07-18 VITALS — BP 138/80 | HR 98 | Temp 98.0°F | Ht 66.0 in | Wt 202.0 lb

## 2017-07-18 DIAGNOSIS — J181 Lobar pneumonia, unspecified organism: Principal | ICD-10-CM

## 2017-07-18 DIAGNOSIS — J189 Pneumonia, unspecified organism: Secondary | ICD-10-CM

## 2017-07-18 LAB — BASIC METABOLIC PANEL
BUN: 16 mg/dL (ref 6–23)
CO2: 26 mEq/L (ref 19–32)
Calcium: 9.3 mg/dL (ref 8.4–10.5)
Chloride: 105 mEq/L (ref 96–112)
Creatinine, Ser: 1.06 mg/dL (ref 0.40–1.50)
GFR: 70.21 mL/min (ref >60.00)
Glucose, Bld: 163 mg/dL — ABNORMAL HIGH (ref 70–99)
Potassium: 4.2 mEq/L (ref 3.5–5.1)
Sodium: 141 mEq/L (ref 135–145)

## 2017-07-18 LAB — CBC WITH DIFFERENTIAL/PLATELET
Basophils Absolute: 0.2 10*3/uL — ABNORMAL HIGH (ref 0.0–0.1)
Basophils Relative: 2.1 % (ref 0.0–3.0)
Eosinophils Absolute: 0.3 10*3/uL (ref 0.0–0.7)
Eosinophils Relative: 3.9 % (ref 0.0–5.0)
HCT: 35.8 % — ABNORMAL LOW (ref 39.0–52.0)
Hemoglobin: 11.6 g/dL — ABNORMAL LOW (ref 13.0–17.0)
Lymphocytes Relative: 17.9 % (ref 12.0–46.0)
Lymphs Abs: 1.5 10*3/uL (ref 0.7–4.0)
MCHC: 32.4 g/dL (ref 30.0–36.0)
MCV: 78.6 fl (ref 78.0–100.0)
Monocytes Absolute: 0.5 10*3/uL (ref 0.1–1.0)
Monocytes Relative: 5.9 % (ref 3.0–12.0)
Neutro Abs: 6 10*3/uL (ref 1.4–7.7)
Neutrophils Relative %: 70.2 % (ref 43.0–77.0)
Platelets: 344 10*3/uL (ref 150.0–400.0)
RBC: 4.56 Mil/uL (ref 4.22–5.81)
RDW: 16.4 % — ABNORMAL HIGH (ref 11.5–15.5)
WBC: 8.5 10*3/uL (ref 4.0–10.5)

## 2017-07-18 NOTE — Patient Instructions (Addendum)
Follow-up as scheduled for your diabetes follow-up.   Please go to the Lab for blood work.    If you have MyChart, your results will be available to view, please respond through [place] with questions.  We will schedule follow-up according to results.  Go to the [place] office for your chest x-ray.    If you develop any fever or shortness of breath, call us to be seen!

## 2017-07-18 NOTE — Progress Notes (Signed)
Subjective  CC:  Chief Complaint  Patient presents with  . Hospitalization Follow-up    went to hospital in [place], diagnosed with Pneumonia     HPI: [name] is a 84 y.o. male who presents to the office today to address the problems listed above in the chief complaint.  Hospitalized at [facility] in [place] 6/13-15 for CAP, RUL consolidation on CXR. Reports felt well but was very short of breath climbing up stadium stairs. The next am had rigors. I don't have d/c summary but have xray report. He was discharged on augmentin and doxy. Reports feels fine. No sob, DOE, CP, abdominal pain, loss of appetite. Tolerating meds w/o side effects.   I reviewed the patients updated PMH, FH, and SocHx.    Patient Active Problem List   Diagnosis Date Noted  . Diabetic peripheral neuropathy associated with type 2 diabetes mellitus ([place]) 05/05/2017    Priority: High  . Atherosclerotic heart disease of native coronary artery without angina pectoris 06/13/2016    Priority: High  . CVA (cerebral vascular accident) ([place]) 06/06/2016    Priority: High  . Recurrent coronary arteriosclerosis after percutaneous transluminal coronary angioplasty 04/18/2015    Priority: High  . CAD S/P PCI- Nov 2016     Priority: High  . Current use of long term anticoagulation 02/06/2013    Priority: High  . Chronic atrial fibrillation ([place]) 07/30/2011    Priority: High  . Hx of CABG x 2 1990     Priority: High  . Essential (primary) hypertension     Priority: High  . Diabetes mellitus type 2, insulin dependent ([place])     Priority: High  . Hyperlipemia     Priority: High  . Type 2 diabetes mellitus ([place]) 01/15/2010    Priority: High  . Obesity (BMI 30.0-34.9) 01/31/2012    Priority: Medium  . Gastro-esophageal reflux disease without esophagitis 12/07/2010    Priority: Medium  . Epistaxis, recurrent 04/11/2015    Priority: Low  . Osteoarthritis, knee 01/31/2012    Priority: Low  . Allergic  rhinitis 10/28/2011    Priority: Low  . Hearing loss 06/08/2011    Priority: Low  . Primary osteoarthritis of hand 06/08/2011    Priority: Low  . Hematuria 08/27/2008    Priority: Low  . Leukocytosis 06/02/2016  . Carotid stenosis 06/02/2016  . Chronic vertigo 03/15/2014  . Enlarged prostate without lower urinary tract symptoms (luts) 01/04/2011   Current Meds  Medication Sig  . amoxicillin-clavulanate (AUGMENTIN) 875-125 MG tablet TAKE ONE TABLET EVERY 12 HOURS FOR 7 DAYS  . apixaban (ELIQUIS) 5 MG TABS tablet Take 1 tablet (5 mg total) by mouth 2 (two) times daily.  [name] atorvastatin (LIPITOR) 40 MG tablet Take 1 tablet (40 mg total) by mouth daily at 6 PM.  . AVODART 0.5 MG capsule Take 0.5 mg by mouth every other day.   . diltiazem (CARDIZEM CD) 360 MG 24 hr capsule Take 1 capsule (360 mg total) by mouth daily.  [name] doxycycline (VIBRAMYCIN) 100 MG capsule TAKE ONE CAPSULE 2 TIMES A DAY FOR 7 DAYS  . empagliflozin (JARDIANCE) 10 MG TABS tablet Take 10 mg by mouth daily.  . insulin glargine (LANTUS) 100 UNIT/ML injection Inject 38 Units into the skin at bedtime.  . metFORMIN (GLUCOPHAGE) 500 MG tablet Take 500 mg by mouth 2 (two) times daily with a meal.  . metoprolol succinate (TOPROL-XL) 100 MG 24 hr tablet TAKE 1 TABLET BY MOUTH TWICE DAILY  .  nitroGLYCERIN (NITROSTAT) 0.4 MG SL tablet Place 0.4 mg under the tongue every 5 (five) minutes as needed for chest pain (x 3 doses).  . pantoprazole (PROTONIX) 40 MG tablet TAKE 1 TABLET BY MOUTH DAILY AT NOON  . PRECISION XTRA TEST STRIPS test strip TEST BLOOD SUGAR 1-2 TIMES DAILY  . Tamsulosin HCl (FLOMAX) 0.4 MG CAPS Take 0.4 mg by mouth every other day.     Allergies: Patient is allergic to erythromycin base; amoxicillin; cefadroxil; ciprofloxacin; and erythromycin. Family History: Patient family history includes Brain cancer in his father; Throat cancer in his brother. Social History:  Patient  reports that he quit smoking about 59  years ago. His smoking use included cigarettes. He has a 30.00 pack-year smoking history. He has never used smokeless tobacco. He reports that he does not drink alcohol or use drugs.  Review of Systems: Constitutional: Negative for fever malaise or anorexia Cardiovascular: negative for chest pain Respiratory: negative for SOB or persistent cough Gastrointestinal: negative for abdominal pain  Objective  Vitals: BP 138/80   Pulse 98   Temp 98 F (36.7 C)   Ht 5\' 6"  (1.676 m)   Wt 202 lb (91.6 kg)   SpO2 96%   BMI 32.60 kg/m  General: no acute distress , A&Ox3, appears well HEENT: PEERL, conjunctiva normal, Oropharynx moist,neck is supple Cardiovascular:  irreg irreg Respiratory:  Good breath sounds bilaterally, CTAB with normal respiratory effort, no rales rhonchi or wheeze Skin:  Warm, no rashes  Assessment  1. Community acquired pneumonia of right upper lobe of lung ([place])      Plan   CAP:  CXR showed RUL consolidation. Repeat CXR today and CBC/BMP. To complete treatment. Clinically looks well. Further eval pending results.   Has f/u in 2 weeks  Follow up: Return for as scheduled.    Commons side effects, risks, benefits, and alternatives for medications and treatment plan prescribed today were discussed, and the patient expressed understanding of the given instructions. Patient is instructed to call or message via MyChart if he/she has any questions or concerns regarding our treatment plan. No barriers to understanding were identified. We discussed Red Flag symptoms and signs in detail. Patient expressed understanding regarding what to do in case of urgent or emergency type symptoms.   Medication list was reconciled, printed and provided to the patient in AVS. Patient instructions and summary information was reviewed with the patient as documented in the AVS. This note was prepared with assistance of Dragon voice recognition software. Occasional wrong-word or sound-a-like  substitutions may have occurred due to the inherent limitations of voice recognition software  Orders Placed This Encounter  Procedures  . DG Chest 2 View  . Basic metabolic panel  . CBC with Differential/Platelet   No orders of the defined types were placed in this encounter.

## 2017-07-18 NOTE — Progress Notes (Signed)
Please call patient: I have reviewed his/her lab results. His blood work looks fine and chest xray shows RUL pneumonia as we expected but no other worrisome findings. I would like him to get another chest xray in 6-8 weeks to ensure that it completely resolved.

## 2017-07-20 LAB — HEPATIC FUNCTION PANEL
ALT: 21 (ref 10–40)
AST: 13 — AB (ref 14–40)

## 2017-07-20 LAB — LIPID PANEL
Cholesterol: 159 (ref 0–200)
HDL: 42 (ref 35–70)
LDL Cholesterol: 89
Triglycerides: 139 (ref 40–160)

## 2017-07-20 LAB — CBC AND DIFFERENTIAL
HCT: 40 — AB (ref 41–53)
Hemoglobin: 12.4 — AB (ref 13.5–17.5)
WBC: 11.9

## 2017-07-20 LAB — BASIC METABOLIC PANEL
Creatinine: 1.4 — AB (ref 0.6–1.3)
Glucose: 198
Potassium: 5 (ref 3.4–5.3)
Sodium: 141 (ref 137–147)

## 2017-07-29 ENCOUNTER — Encounter: Payer: Self-pay | Admitting: Podiatry

## 2017-07-29 ENCOUNTER — Ambulatory Visit (INDEPENDENT_AMBULATORY_CARE_PROVIDER_SITE_OTHER): Payer: Medicare Other | Admitting: Podiatry

## 2017-07-29 DIAGNOSIS — M79674 Pain in right toe(s): Secondary | ICD-10-CM | POA: Diagnosis not present

## 2017-07-29 DIAGNOSIS — E119 Type 2 diabetes mellitus without complications: Secondary | ICD-10-CM | POA: Diagnosis not present

## 2017-07-29 DIAGNOSIS — D689 Coagulation defect, unspecified: Secondary | ICD-10-CM | POA: Diagnosis not present

## 2017-07-29 DIAGNOSIS — B351 Tinea unguium: Secondary | ICD-10-CM | POA: Diagnosis not present

## 2017-07-29 NOTE — Progress Notes (Signed)
Patient ID: [name], male   DOB: 1928/04/23, 84 y.o.   MRN: [id number] Complaint:  Visit Type: Patient returns to my office for continued preventative foot care services. Complaint: Patient states" my nails have grown long and thick and become painful to walk and wear shoes" Patient has been diagnosed with DM with no foot  complications. He presents for preventative foot care services. No changes to ROS.  Patient is on eliquiss.  Podiatric Exam: Vascular: dorsalis pedis and posterior tibial pulses are palpable bilateral. Capillary return is immediate. Temperature gradient is WNL. Skin turgor WNL  Sensorium: Normal Semmes Weinstein monofilament test. Normal tactile sensation bilaterally. Nail Exam: Pt has thick disfigured discolored nails with subungual debris noted bilateral entire nail hallux through fifth toenails Ulcer Exam: There is no evidence of ulcer or pre-ulcerative changes or infection. Orthopedic Exam: Muscle tone and strength are WNL. No limitations in general ROM. No crepitus or effusions noted. Foot type and digits show no abnormalities. Bony prominences are unremarkable. Skin: No Porokeratosis. No infection or ulcers  Diagnosis:  Tinea unguium, Pain in right toe, pain in left toes  Treatment & Plan Procedures and Treatment: Consent by patient was obtained for treatment procedures. The patient understood the discussion of treatment and procedures well. All questions were answered thoroughly reviewed. Debridement of mycotic and hypertrophic toenails, 1 through 5 bilateral and clearing of subungual debris. No ulceration, no infection noted. ABN signed for 2019.[name] Return Visit-Office Procedure: Patient instructed to return to the office for a follow up visit 3 months for continued evaluation and treatment.    Gardiner Barefoot DPM

## 2017-08-01 ENCOUNTER — Ambulatory Visit (INDEPENDENT_AMBULATORY_CARE_PROVIDER_SITE_OTHER): Payer: Medicare Other | Admitting: Family Medicine

## 2017-08-01 ENCOUNTER — Encounter: Payer: Self-pay | Admitting: Family Medicine

## 2017-08-01 ENCOUNTER — Other Ambulatory Visit: Payer: Self-pay

## 2017-08-01 VITALS — BP 120/84 | HR 81 | Temp 98.0°F | Ht 66.0 in | Wt 203.2 lb

## 2017-08-01 DIAGNOSIS — E1142 Type 2 diabetes mellitus with diabetic polyneuropathy: Secondary | ICD-10-CM | POA: Diagnosis not present

## 2017-08-01 DIAGNOSIS — J181 Lobar pneumonia, unspecified organism: Secondary | ICD-10-CM

## 2017-08-01 DIAGNOSIS — I482 Chronic atrial fibrillation, unspecified: Secondary | ICD-10-CM

## 2017-08-01 DIAGNOSIS — I10 Essential (primary) hypertension: Secondary | ICD-10-CM

## 2017-08-01 DIAGNOSIS — I2510 Atherosclerotic heart disease of native coronary artery without angina pectoris: Secondary | ICD-10-CM

## 2017-08-01 DIAGNOSIS — J189 Pneumonia, unspecified organism: Secondary | ICD-10-CM

## 2017-08-01 DIAGNOSIS — Z9861 Coronary angioplasty status: Secondary | ICD-10-CM

## 2017-08-01 DIAGNOSIS — Z794 Long term (current) use of insulin: Secondary | ICD-10-CM | POA: Diagnosis not present

## 2017-08-01 DIAGNOSIS — E119 Type 2 diabetes mellitus without complications: Secondary | ICD-10-CM

## 2017-08-01 LAB — POCT GLYCOSYLATED HEMOGLOBIN (HGB A1C): Hemoglobin A1C: 7.7 % — AB (ref 4.0–5.6)

## 2017-08-01 LAB — MICROALBUMIN / CREATININE URINE RATIO
Creatinine,U: 85.7 mg/dL
Microalb Creat Ratio: 4.1 mg/g (ref 0.0–30.0)
Microalb, Ur: 3.5 mg/dL — ABNORMAL HIGH (ref 0.0–1.9)

## 2017-08-01 NOTE — Progress Notes (Signed)
Subjective  CC:  Chief Complaint  Patient presents with  . Diabetes    doing well, states he feels good, last A1c 04/2017    HPI: [name] is a 84 y.o. male who presents to the office today for follow up of diabetes and problems listed above in the chief complaint.   Diabetes follow up: His diabetic control is reported as Unchanged. eating well and weight remains down. Feels better. No AEs from meds.  He denies exertional CP or SOB or symptomatic hypoglycemia. He denies foot sores or paresthesias. His neuropathy sxs are much improved with an otc cream from [employer].   F/u PNA - feels back to normal now. No sob, cough or malaise. Energy level is good again. We reviewed his xray findings and need for f/u study in 6-8 weeks. Labs were stable.   HTN f/u - remains stable as well. No CAD or cp. No le edema. Tolerating meds.   [facility]   Assessment  1. Diabetes mellitus type 2, insulin dependent ([place])   2. Community acquired pneumonia of right upper lobe of lung ([place])   3. Essential (primary) hypertension   4. Diabetic peripheral neuropathy associated with type 2 diabetes mellitus ([place])   5. CAD S/P PCI- Nov 2016   6. Chronic atrial fibrillation ([place])      Plan   Diabetes is currently well controlled. At goal for patient: a1C < 8.0 given age and comorbidities. No change in meds. Recheck labs at next visit. Urine testing today. imms up to date.   HTN is controlled. No sxs of CAD. F/u with cards.   PNA - recovered well. Will reheck cxr in 6-8 weeks to ensure resolution.   afib is rate controlled.   Follow up: Return in about 4 months (around 12/01/2017). Orders Placed This Encounter  Procedures  . Urine Microalbumin w/creat. ratio  . POCT glycosylated hemoglobin (Hb A1C)   No orders of the defined types were placed in this encounter.     Immunization History  Administered Date(s) Administered  . Influenza Split 11/14/2007, 01/01/2009,  12/23/2009  . Influenza, High Dose Seasonal PF 11/02/2012, 12/12/2013, 11/15/2014  . Influenza, Seasonal, Injecte, Preservative Fre 11/18/2015  . Influenza,trivalent, recombinat, inj, PF 12/07/2010  . Influenza-Unspecified 10/28/2011, 11/30/2014, 02/15/2017  . Pneumococcal Conjugate-13 12/13/2013  . Pneumococcal-Unspecified 08/20/2008  . Td 05/29/2002  . Tdap 10/28/2011, 02/15/2017  . Zoster 08/20/2008  . Zoster Recombinat (Shingrix) 10/13/2016, 12/13/2016    Diabetes Related Lab Review: Lab Results  Component Value Date   HGBA1C 7.7 (A) 08/01/2017   HGBA1C 7.7 05/05/2017   HGBA1C 7.7 01/11/2017    No results found for: Derl Barrow Lab Results  Component Value Date   CREATININE 1.06 07/18/2017   BUN 16 07/18/2017   NA 141 07/18/2017   K 4.2 07/18/2017   CL 105 07/18/2017   CO2 26 07/18/2017   Lab Results  Component Value Date   CHOL 178 06/07/2016   Lab Results  Component Value Date   HDL 45 06/07/2016   Lab Results  Component Value Date   LDLCALC 61 01/11/2017   LDLCALC 106 (H) 06/07/2016   Lab Results  Component Value Date   TRIG 133 06/07/2016   Lab Results  Component Value Date   CHOLHDL 4.0 06/07/2016   No results found for: LDLDIRECT The ASCVD Risk score Mikey Bussing DC Jr., et al., 2013) failed to calculate for the following reasons:   The 2013 ASCVD risk score  is only valid for ages 24 to 90   The patient has a prior MI or stroke diagnosis I have reviewed the [place], Fam and Soc history. Patient Active Problem List   Diagnosis Date Noted  . Diabetic peripheral neuropathy associated with type 2 diabetes mellitus ([place]) 05/05/2017    Priority: High    No pain   . Atherosclerotic heart disease of native coronary artery without angina pectoris 06/13/2016    Priority: High    Overview:  Overview:  RCA DES placed Nov 2016, LIMA-LAD presumed patent based on Myoview but no visualized, SVG-Dx occluded  Last Assessment & Plan:  RCA DES placed Nov  2016, LIMA-LAD presumed patent based on Myoview but no visualized, SVG-Dx occluded   . CVA (cerebral vascular accident) ([place]) 06/06/2016    Priority: High  . Recurrent coronary arteriosclerosis after percutaneous transluminal coronary angioplasty 04/18/2015    Priority: High    Overview:  Overview:  RCA DES placed Nov 2016, LIMA-LAD presumed patent based on Myoview but no visualized, SVG-Dx occluded  Last Assessment & Plan:  RCA DES placed Nov 2016, LIMA-LAD presumed patent based on Myoview but no visualized, SVG-Dx occluded   . CAD S/P PCI- Nov 2016     Priority: High    RCA DES placed Nov 2016, LIMA-LAD presumed patent based on Myoview but no visualized, SVG-Dx occluded   . Current use of long term anticoagulation 02/06/2013    Priority: High    Overview:  Monitored by cardiology   . Chronic atrial fibrillation ([place]) 07/30/2011    Priority: High    CHADs VASc=5 for age, HTN, vascular disease, and DM   . Hx of CABG x 2 1990     Priority: High    Status post CABG x2 in 1990 including an LIMA graft to the LAD, and a vein graft to the diagonal.    . Essential (primary) hypertension     Priority: High  . Diabetes mellitus type 2, insulin dependent ([place])     Priority: High  . Hyperlipemia     Priority: High  . Type 2 diabetes mellitus ([place]) 01/15/2010    Priority: High    Overview:  No diabetic retinopathy (mild htn retinopathy) 10/2013   . Obesity (BMI 30.0-34.9) 01/31/2012    Priority: Medium  . Gastro-esophageal reflux disease without esophagitis 12/07/2010    Priority: Medium  . Epistaxis, recurrent 04/11/2015    Priority: Low  . Osteoarthritis, knee 01/31/2012    Priority: Low  . Allergic rhinitis 10/28/2011    Priority: Low  . Hearing loss 06/08/2011    Priority: Low  . Primary osteoarthritis of hand 06/08/2011    Priority: Low  . Hematuria 08/27/2008    Priority: Low    Overview:  Essential Hematuria - urology - [place]rapey  w/u benign  10/1 IMO  update Overview:  Overview:  Essential Hematuria - urology - Grapey  w/u benign   . Leukocytosis 06/02/2016  . Carotid stenosis 06/02/2016  . Chronic vertigo 03/15/2014  . Enlarged prostate without lower urinary tract symptoms (luts) 01/04/2011    Overview:  Urology - avodart and tamuloscin     Social History: Patient  reports that he quit smoking about 59 years ago. His smoking use included cigarettes. He has a 30.00 pack-year smoking history. He has never used smokeless tobacco. He reports that he does not drink alcohol or use drugs.  Review of Systems: Ophthalmic: negative for eye pain, loss of vision or double vision Cardiovascular: negative for chest  pain Respiratory: negative for SOB or persistent cough Gastrointestinal: negative for abdominal pain Genitourinary: negative for dysuria or gross hematuria MSK: negative for foot lesions Neurologic: negative for weakness or gait disturbance  Objective  Vitals: BP 120/84   Pulse 81   Temp 98 F (36.7 C)   Ht 5\' 6"  (1.676 m)   Wt 203 lb 3.2 oz (92.2 kg)   BMI 32.80 kg/m  General: well appearing, no acute distress  Psych:  Alert and oriented, normal mood and affect HEENT:  Normocephalic, atraumatic, moist mucous membranes, supple neck  Cardiovascular:  Nl S1 and S2, RRR without murmur, gallop or rub. no edema Respiratory:  Good breath sounds bilaterally, CTAB with normal effort, no rales Gastrointestinal: normal BS, soft, nontender Skin:  Warm, no rashes Neurologic:   Mental status is normal. normal gait Foot exam: no erythema, pallor, or cyanosis visible nl proprioception and sensation to monofilament testing bilaterally, +2 distal pulses bilaterally    Diabetic education: ongoing education regarding chronic disease management for diabetes was given today. We continue to reinforce the ABC's of diabetic management: A1c (<7 or 8 dependent upon patient), tight blood pressure control, and cholesterol management with goal  LDL < 100 minimally. We discuss diet strategies, exercise recommendations, medication options and possible side effects. At each visit, we review recommended immunizations and preventive care recommendations for diabetics and stress that good diabetic control can prevent other problems. See below for this patient's data.    Commons side effects, risks, benefits, and alternatives for medications and treatment plan prescribed today were discussed, and the patient expressed understanding of the given instructions. Patient is instructed to call or message via MyChart if he/she has any questions or concerns regarding our treatment plan. No barriers to understanding were identified. We discussed Red Flag symptoms and signs in detail. Patient expressed understanding regarding what to do in case of urgent or emergency type symptoms.   Medication list was reconciled, printed and provided to the patient in AVS. Patient instructions and summary information was reviewed with the patient as documented in the AVS. This note was prepared with assistance of Dragon voice recognition software. Occasional wrong-word or sound-a-like substitutions may have occurred due to the inherent limitations of voice recognition software

## 2017-08-01 NOTE — Patient Instructions (Signed)
Follow-up in 4 months for your Physical.  Please come fasting.   Please go to the [place] office for your X-ray in 6-8 weeks. No appointment is needed, just let them know you have an x-ray ordered and they will take care of you.  [place], [place]

## 2017-08-09 DIAGNOSIS — R3912 Poor urinary stream: Secondary | ICD-10-CM | POA: Diagnosis not present

## 2017-08-12 ENCOUNTER — Ambulatory Visit: Payer: Medicare Other | Admitting: Podiatry

## 2017-08-23 DIAGNOSIS — S61216A Laceration without foreign body of right little finger without damage to nail, initial encounter: Secondary | ICD-10-CM | POA: Diagnosis not present

## 2017-09-01 ENCOUNTER — Other Ambulatory Visit: Payer: Self-pay | Admitting: Physician Assistant

## 2017-09-02 ENCOUNTER — Ambulatory Visit (INDEPENDENT_AMBULATORY_CARE_PROVIDER_SITE_OTHER): Payer: Medicare Other | Admitting: Family Medicine

## 2017-09-02 ENCOUNTER — Encounter: Payer: Self-pay | Admitting: Family Medicine

## 2017-09-02 ENCOUNTER — Other Ambulatory Visit: Payer: Self-pay

## 2017-09-02 VITALS — BP 138/80 | HR 84 | Temp 98.0°F | Ht 66.0 in | Wt 204.6 lb

## 2017-09-02 DIAGNOSIS — S61216D Laceration without foreign body of right little finger without damage to nail, subsequent encounter: Secondary | ICD-10-CM

## 2017-09-02 DIAGNOSIS — Z4802 Encounter for removal of sutures: Secondary | ICD-10-CM | POA: Diagnosis not present

## 2017-09-02 NOTE — Progress Notes (Signed)
Subjective  CC:  Chief Complaint  Patient presents with  . Suture / Staple Removal    Right Pinky Finger     HPI: [name] is a 84 y.o. male who presents to the office today to address the problems listed above in the chief complaint.  DOI 06/25; cut 5th finger right hand on [employer]'s cart. 2 sutures placed by [facility] express care. Doing fine/ needs sutures removed. I reviewed note. Assessment  1. Visit for suture removal   2. Laceration of right little finger without foreign body without damage to nail, subsequent encounter      Plan   lac:  Sutures removed w/o probems. No complications  Follow up: as scheduled.   No orders of the defined types were placed in this encounter.  No orders of the defined types were placed in this encounter.     I reviewed the patients updated PMH, FH, and SocHx.    Patient Active Problem List   Diagnosis Date Noted  . Diabetic peripheral neuropathy associated with type 2 diabetes mellitus ([place]) 05/05/2017    Priority: High  . Atherosclerotic heart disease of native coronary artery without angina pectoris 06/13/2016    Priority: High  . CVA (cerebral vascular accident) ([place]) 06/06/2016    Priority: High  . Recurrent coronary arteriosclerosis after percutaneous transluminal coronary angioplasty 04/18/2015    Priority: High  . CAD S/P PCI- Nov 2016     Priority: High  . Current use of long term anticoagulation 02/06/2013    Priority: High  . Chronic atrial fibrillation ([place]) 07/30/2011    Priority: High  . Hx of CABG x 2 1990     Priority: High  . Essential (primary) hypertension     Priority: High  . Diabetes mellitus type 2, insulin dependent ([place])     Priority: High  . Hyperlipemia     Priority: High  . Type 2 diabetes mellitus ([place]) 01/15/2010    Priority: High  . Obesity (BMI 30.0-34.9) 01/31/2012    Priority: Medium  . Gastro-esophageal reflux disease without esophagitis 12/07/2010    Priority: Medium  . Epistaxis,  recurrent 04/11/2015    Priority: Low  . Osteoarthritis, knee 01/31/2012    Priority: Low  . Allergic rhinitis 10/28/2011    Priority: Low  . Hearing loss 06/08/2011    Priority: Low  . Primary osteoarthritis of hand 06/08/2011    Priority: Low  . Hematuria 08/27/2008    Priority: Low  . Leukocytosis 06/02/2016  . Carotid stenosis 06/02/2016  . Chronic vertigo 03/15/2014  . Enlarged prostate without lower urinary tract symptoms (luts) 01/04/2011   Current Meds  Medication Sig  . apixaban (ELIQUIS) 5 MG TABS tablet Take 1 tablet (5 mg total) by mouth 2 (two) times daily.  [name] atorvastatin (LIPITOR) 40 MG tablet Take 1 tablet (40 mg total) by mouth daily at 6 PM.  . AVODART 0.5 MG capsule Take 0.5 mg by mouth every other day.   . diltiazem (CARDIZEM CD) 360 MG 24 hr capsule Take 1 capsule (360 mg total) by mouth daily.  . empagliflozin (JARDIANCE) 10 MG TABS tablet Take 10 mg by mouth daily.  . insulin glargine (LANTUS) 100 UNIT/ML injection Inject 38 Units into the skin at bedtime.  . metFORMIN (GLUCOPHAGE) 500 MG tablet Take 500 mg by mouth 2 (two) times daily with a meal.  . metoprolol succinate (TOPROL-XL) 100 MG 24 hr tablet TAKE 1 TABLET BY MOUTH TWICE DAILY  . nitroGLYCERIN (NITROSTAT) 0.4  MG SL tablet Place 0.4 mg under the tongue every 5 (five) minutes as needed for chest pain (x 3 doses).  . pantoprazole (PROTONIX) 40 MG tablet TAKE 1 TABLET BY MOUTH DAILY AT NOON  . PRECISION XTRA TEST STRIPS test strip TEST BLOOD SUGAR 1-2 TIMES DAILY  . Tamsulosin HCl (FLOMAX) 0.4 MG CAPS Take 0.4 mg by mouth every other day.     Allergies: Patient is allergic to erythromycin base; amoxicillin; cefadroxil; ciprofloxacin; and erythromycin. Family History: Patient family history includes Brain cancer in his father; Throat cancer in his brother. Social History:  Patient  reports that he quit smoking about 59 years ago. His smoking use included cigarettes. He has a 30.00 pack-year smoking  history. He has never used smokeless tobacco. He reports that he does not drink alcohol or use drugs.  Review of Systems: Constitutional: Negative for fever malaise or anorexia Cardiovascular: negative for chest pain Respiratory: negative for SOB or persistent cough Gastrointestinal: negative for abdominal pain  Objective  Vitals: BP 138/80   Pulse 84   Temp 98 F (36.7 C)   Ht 5\' 6"  (1.676 m)   Wt 204 lb 9.6 oz (92.8 kg)   SpO2 97%   BMI 33.02 kg/m  General: no acute distress , A&Ox3 Right fifth finger pad with well healed u-shaped lac; 2 sutures removed in routine fashion w/o complications. Nl sensation to light touch   Commons side effects, risks, benefits, and alternatives for medications and treatment plan prescribed today were discussed, and the patient expressed understanding of the given instructions. Patient is instructed to call or message via MyChart if he/she has any questions or concerns regarding our treatment plan. No barriers to understanding were identified. We discussed Red Flag symptoms and signs in detail. Patient expressed understanding regarding what to do in case of urgent or emergency type symptoms.   Medication list was reconciled, printed and provided to the patient in AVS. Patient instructions and summary information was reviewed with the patient as documented in the AVS. This note was prepared with assistance of Dragon voice recognition software. Occasional wrong-word or sound-a-like substitutions may have occurred due to the inherent limitations of voice recognition software

## 2017-09-02 NOTE — Patient Instructions (Signed)
Please follow up if symptoms do not improve or as needed.   

## 2017-09-07 ENCOUNTER — Ambulatory Visit (INDEPENDENT_AMBULATORY_CARE_PROVIDER_SITE_OTHER): Payer: Medicare Other

## 2017-09-07 ENCOUNTER — Other Ambulatory Visit: Payer: Self-pay | Admitting: Family Medicine

## 2017-09-07 DIAGNOSIS — J189 Pneumonia, unspecified organism: Secondary | ICD-10-CM | POA: Diagnosis not present

## 2017-09-07 DIAGNOSIS — J181 Lobar pneumonia, unspecified organism: Principal | ICD-10-CM

## 2017-09-07 DIAGNOSIS — R918 Other nonspecific abnormal finding of lung field: Secondary | ICD-10-CM

## 2017-09-08 NOTE — Progress Notes (Signed)
Please call patient: I have reviewed his/her xray results. Please let him know that the pneumonia has cleared, however, a small area in right upper lung persists and I'd like him to have a chest CT to further clarify what it is. It could be a mass, a lymph node or something else. No need to worry at this point but do need to look at it more closely. I have ordered the CT scan and we will call him with the appt.

## 2017-10-03 ENCOUNTER — Ambulatory Visit
Admission: RE | Admit: 2017-10-03 | Discharge: 2017-10-03 | Disposition: A | Discharge status: Home / Self Care | Payer: Medicare Other | Source: Ambulatory Visit | Attending: Family Medicine | Admitting: Family Medicine

## 2017-10-03 DIAGNOSIS — R918 Other nonspecific abnormal finding of lung field: Secondary | ICD-10-CM

## 2017-10-03 DIAGNOSIS — J189 Pneumonia, unspecified organism: Secondary | ICD-10-CM | POA: Diagnosis not present

## 2017-10-03 DIAGNOSIS — J181 Lobar pneumonia, unspecified organism: Principal | ICD-10-CM

## 2017-10-03 MED ORDER — IOPAMIDOL (ISOVUE-300) INJECTION 61%
75.0000 mL | Freq: Once | INTRAVENOUS | Status: AC | PRN
  Administered 2017-10-03: 75 mL via INTRAVENOUS

## 2017-10-04 NOTE — Progress Notes (Signed)
Please call patient: I have reviewed his/her lab results. Please let pt know that his chest CT looks fine. There are several benign nodules (small) that won't need follow up. No other masses or worrisome lesions are noted in the lungs. He does have some gallstones. Things look good overall.

## 2017-10-28 ENCOUNTER — Ambulatory Visit (INDEPENDENT_AMBULATORY_CARE_PROVIDER_SITE_OTHER): Payer: Medicare Other | Admitting: Podiatry

## 2017-10-28 ENCOUNTER — Encounter: Payer: Self-pay | Admitting: Podiatry

## 2017-10-28 DIAGNOSIS — M79674 Pain in right toe(s): Secondary | ICD-10-CM

## 2017-10-28 DIAGNOSIS — D689 Coagulation defect, unspecified: Secondary | ICD-10-CM

## 2017-10-28 DIAGNOSIS — E119 Type 2 diabetes mellitus without complications: Secondary | ICD-10-CM | POA: Diagnosis not present

## 2017-10-28 DIAGNOSIS — B351 Tinea unguium: Secondary | ICD-10-CM

## 2017-10-28 NOTE — Progress Notes (Signed)
Patient ID: [name], male   DOB: 01/24/1929, 84 y.o.   MRN: [id number] Complaint:  Visit Type: Patient returns to my office for continued preventative foot care services. Complaint: Patient states" my nails have grown long and thick and become painful to walk and wear shoes" Patient has been diagnosed with DM with no foot  complications. He presents for preventative foot care services. No changes to ROS.  Patient is on eliquiss.  Podiatric Exam: Vascular: dorsalis pedis and posterior tibial pulses are palpable bilateral. Capillary return is immediate. Temperature gradient is WNL. Skin turgor WNL  Sensorium: Normal Semmes Weinstein monofilament test. Normal tactile sensation bilaterally. Nail Exam: Pt has thick disfigured discolored nails with subungual debris noted bilateral entire nail hallux through fifth toenails Ulcer Exam: There is no evidence of ulcer or pre-ulcerative changes or infection. Orthopedic Exam: Muscle tone and strength are WNL. No limitations in general ROM. No crepitus or effusions noted. Foot type and digits show no abnormalities. Bony prominences are unremarkable. Skin: No Porokeratosis. No infection or ulcers  Diagnosis:  Tinea unguium, Pain in right toe, pain in left toes  Treatment & Plan Procedures and Treatment: Consent by patient was obtained for treatment procedures. The patient understood the discussion of treatment and procedures well. All questions were answered thoroughly reviewed. Debridement of mycotic and hypertrophic toenails, 1 through 5 bilateral and clearing of subungual debris. No ulceration, no infection noted. ABN signed for 2019.. Return Visit-Office Procedure: Patient instructed to return to the office for a follow up visit 10 weeks  for continued evaluation and treatment.    Jvion Turgeon DPM 

## 2017-11-23 NOTE — Progress Notes (Signed)
[name] Date of Birth: 1928/03/15   History of Present Illness: [name] is seen for followup atrial fibrillation and CAD.  He  Has a history of chronic atrial fibrillation managed with rate control and anticoagulation with coumadin. He also has a history of CAD s/p CABG in 1990. Echo in 2013 showed normal LV function. Very mild AS.  biatrial enlargement. In October 2016 he had an abnormal [place] which showed inferior wall ischemia. He underwent cardiac cath. This showed occlusion of the proximal LAD and a critical stenosis of the mid RCA. The SVG to diagonal was occluded. The LIMA to the LAD was patent. He had stenting of the mid RCA with a DES. He is on Plavix. He was admitted in early April 2018 with acute vertigo and a fall. He was found on MRI to have a left parietal infarct. Head CT was negative but did show a 60% right carotid stenosis. INR was 1.8. He was switched from Coumadin to Eliquis. Echo showed mild aortic stenosis, normal LV function. No source of emboli. Zocor was switched to Lipitor and lantus dose was increased. He was DC with home PT. He has since been started on Jardiance.   In May 2019 he was treated for CAP. In July he had stitches for lacerated finger. CT chest in August showed calcification of the pericardium but resolution of RUL infiltrate.   On follow up today he states he is doing well.  Still does daily vestibular exercises.  He denies any significant palpitations, chest pain, or shortness of breath. No recurrent TIA or CVA symptoms.  He is sedentary.  Current Outpatient Medications on File Prior to Visit  Medication Sig Dispense Refill  . apixaban (ELIQUIS) 5 MG TABS tablet Take 1 tablet (5 mg total) by mouth 2 (two) times daily. 60 tablet 2  . atorvastatin (LIPITOR) 40 MG tablet Take 1 tablet (40 mg total) by mouth daily at 6 PM. 30 tablet 2  . AVODART 0.5 MG capsule Take 0.5 mg by mouth every other day.     . diltiazem (CARDIZEM CD) 360 MG 24  hr capsule Take 1 capsule (360 mg total) by mouth daily. 30 capsule 6  . empagliflozin (JARDIANCE) 10 MG TABS tablet Take 10 mg by mouth daily.    . insulin glargine (LANTUS) 100 UNIT/ML injection Inject 38 Units into the skin at bedtime.    . metFORMIN (GLUCOPHAGE) 500 MG tablet Take 500 mg by mouth 2 (two) times daily with a meal.    . metoprolol succinate (TOPROL-XL) 100 MG 24 hr tablet TAKE 1 TABLET BY MOUTH TWICE DAILY 180 tablet 3  . nitroGLYCERIN (NITROSTAT) 0.4 MG SL tablet Place 0.4 mg under the tongue every 5 (five) minutes as needed for chest pain (x 3 doses).    . pantoprazole (PROTONIX) 40 MG tablet TAKE 1 TABLET BY MOUTH DAILY AT NOON 90 tablet 0  . PRECISION XTRA TEST STRIPS test strip TEST BLOOD SUGAR 1-2 TIMES DAILY  0  . Tamsulosin HCl (FLOMAX) 0.4 MG CAPS Take 0.4 mg by mouth every other day.      No current facility-administered medications on file prior to visit.     Allergies  Allergen Reactions  . Erythromycin Base   . Amoxicillin Other (See Comments)    Unknown Unknown  . Cefadroxil Other (See Comments)    Unknown Unknown  . Ciprofloxacin Other (See Comments)    Unknown Unknown  . Erythromycin Other (See Comments)    Upset  stomach Upset stomach    Past Medical History:  Diagnosis Date  . Abdominal pain   . Acute renal failure ([place])     resolved  . Arthritis    "hands & legs" (11/'10/2014)  . Ataxia   . Atrial fibrillation ([place])   . Bladder outlet obstruction   . Bladder outlet obstruction   . BPH (benign prostatic hyperplasia)   . CAD (coronary artery disease)    a. CABG IN 1989. b. 01/08/2015 CTO of ost LAD, LIMA to LAD not visualized but assumed patent given myoview finding, occluded SVG to diagonal, 99% mid RCA tx w/ SYNERGY DES 3X28 mm  . Constipation   . Diabetes mellitus type 2, insulin dependent ([place])   . Epistaxis, recurrent Feb 2017  . GERD (gastroesophageal reflux disease)   . HTN (hypertension)   . Hx of bacterial pneumonia   .  Hyperlipemia   . Kidney stones   . Mild aortic stenosis   . Rib fractures    left rib fractures being treated with pain medications  . Stented coronary artery Nov 2016   RCA DES  . Urinary tract infection     Enterococcus    Past Surgical History:  Procedure Laterality Date  . CARDIAC CATHETERIZATION  1989  . CARDIAC CATHETERIZATION N/A 01/08/2015   Procedure: Left Heart Cath and Cors/Grafts Angiography;  Surgeon: Christian Treadway M Martinique, MD;  Location: [facility];  Service: Cardiovascular;  Laterality: N/A;  . CARDIAC CATHETERIZATION  01/08/2015   Procedure: Coronary Stent Intervention;  Surgeon: Tola Meas M Martin[place], MD;  Location: [facility];  Service: Cardiovascular;;  . CARPAL TUNNEL RELEASE Right 08/2010  . CATARACT EXTRACTION W/ INTRAOCULAR LENS  IMPLANT, BILATERAL Bilateral   . CORONARY ANGIOPLASTY  01/08/15   RCA DES  . CORONARY ARTERY BYPASS GRAFT  1989   "CABG X 2"  . JOINT REPLACEMENT    . TONSILLECTOMY    . TOTAL HIP ARTHROPLASTY Right 2000    Social History   Tobacco Use  Smoking Status Former Smoker  . Packs/day: 3.00  . Years: 10.00  . Pack years: 30.00  . Types: Cigarettes  . Last attempt to quit: 05/11/1958  . Years since quitting: 59.5  Smokeless Tobacco Never Used    Social History   Substance and Sexual Activity  Alcohol Use No    Family History  Problem Relation Age of Onset  . Brain cancer Father   . Throat cancer Brother     Review of Systems: As noted in history of present illness.  All other systems were reviewed and are negative.  Physical Exam: BP (!) 110/52   Pulse 62   Ht 5\' 6"  (1.676 m)   Wt 206 lb (93.4 kg)   BMI 33.25 kg/m  GENERAL:  Well appearing, obese WM in NAD HEENT:  PERRL, EOMI, sclera are clear. Oropharynx is clear. NECK:  No jugular venous distention, carotid upstroke brisk and symmetric, no bruits, no thyromegaly or adenopathy LUNGS:  Clear to auscultation bilaterally CHEST:  Unremarkable HEART:  RRR,  PMI not  displaced or sustained,S1 and S2 within normal limits, no S3, no S4: no clicks, no rubs, no murmurs ABD:  Soft, nontender. BS +, no masses or bruits. No hepatomegaly, no splenomegaly EXT:  2 + pulses throughout, no edema, no cyanosis no clubbing SKIN:  Warm and dry.  No rashes NEURO:  Alert and oriented x 3. Cranial nerves II through XII intact. PSYCH:  Cognitively intact  LABORATORY DATA:  Lab Results  Component Value Date   WBC 11.9 07/20/2017   HGB 12.4 (A) 07/20/2017   HCT 40 (A) 07/20/2017   PLT 344.0 07/18/2017   GLUCOSE 163 (H) 07/18/2017   CHOL 159 07/20/2017   TRIG 139 07/20/2017   HDL 42 07/20/2017   LDLCALC 89 07/20/2017   ALT 21 07/20/2017   AST 13 (A) 07/20/2017   NA 141 07/20/2017   K 5.0 07/20/2017   CL 105 07/18/2017   CREATININE 1.4 (A) 07/20/2017   BUN 16 07/18/2017   CO2 26 07/18/2017   PSA 106.83 Test Methodology: Hybritech PSA (H) 11/09/2009   INR 1.90 06/07/2016   HGBA1C 7.7 (A) 08/01/2017   MICROALBUR 3.5 (H) 08/01/2017   Labs from [place] dated 01/11/17: cholesterol 132, triglycerides 71, HDL 57, LDL 61. Creatinine 1.13. Other chemistries are normal.  Ecg today shows Afib rate 93. Nonspecific ST T wave changes. No change compared to prior. I have personally reviewed and interpreted this study.  Echo 06/08/16: Study Conclusions  - Left ventricle: The cavity size was normal. There was mild   concentric hypertrophy. Systolic function was vigorous. The   estimated ejection fraction was in the range of 65% to 70%. Wall   motion was normal; there were no regional wall motion   abnormalities. The study is not technically sufficient to allow   evaluation of LV diastolic function. - Aortic valve: Trileaflet; moderately thickened, moderately   calcified leaflets. Valve mobility was restricted. There was mild   stenosis. There was trivial regurgitation. Peak velocity (S): 254   cm/s. Mean gradient (S): 12 mm Hg. Valve area (VTI): 1.59 cm^2. - Mitral  valve: Calcified annulus. Mildly thickened leaflets .   There was mild regurgitation. - Left atrium: The atrium was mildly dilated. - Tricuspid valve: There was mild regurgitation. - Pulmonary arteries: Systolic pressure was mildly increased. PA   peak pressure: 33 mm Hg (S).  Impressions:  - Aortic stenosis has progressed since prior echocardiogram.  CT chest 10/03/17: IMPRESSION: 1. No acute findings are noted in the thorax. 2. Tiny pulmonary nodules measuring 5 mm or less in size, nonspecific, but statistically likely benign. No follow-up needed if patient is low-risk (and has no known or suspected primary neoplasm). Non-contrast chest CT can be considered in 12 months if patient is high-risk. This recommendation follows the consensus statement: Guidelines for Management of Incidental Pulmonary Nodules Detected on CT Images: From the Fleischner Society 2017; Radiology 2017; 284:228-243. 3. Aortic atherosclerosis, in addition to left main and 3 vessel coronary artery disease. Status post median sternotomy for CABG including LIMA to the LAD. 4. Pericardial calcification overlying the atrioventricular groove. Evaluation with nonemergent echocardiography is suggested in the near future if there is any clinical concern for constrictive physiology 5. Cholelithiasis.   Assessment / Plan: 1. Chronic atrial fibrillation. Rate is well controlled on   Toprol XL to 100 mg bid and Cardizem CD 360 mg daily.   On Eliquis for anticoagulation. Asymptomatic. Encourage increased aerobic activity. States he is getting a Art therapist" to use at home  2. CAD s/p remote CABG 25 years ago. S/p DES of RCA 01/08/15. He has class 1 angina.  3. Left parietal CVA with ataxia. Now on Eliquis.   4. HTN - well controlled.   5. DM type 2- on insulin, metformin,and  Jardiance.   6. Hyperlipidemia. On lipitor 40 mg.    7. Carotid arterial disease. No change by dopplers in October 2018. Scheduled for repeat  dopplers at [facility] in 2 weeks.     I will follow up in 6 months.

## 2017-11-24 ENCOUNTER — Ambulatory Visit (INDEPENDENT_AMBULATORY_CARE_PROVIDER_SITE_OTHER): Payer: Medicare Other | Admitting: Cardiology

## 2017-11-24 ENCOUNTER — Encounter: Payer: Self-pay | Admitting: Cardiology

## 2017-11-24 ENCOUNTER — Ambulatory Visit (INDEPENDENT_AMBULATORY_CARE_PROVIDER_SITE_OTHER): Payer: Medicare Other

## 2017-11-24 VITALS — BP 110/52 | HR 62 | Ht 66.0 in | Wt 206.0 lb

## 2017-11-24 DIAGNOSIS — I482 Chronic atrial fibrillation, unspecified: Secondary | ICD-10-CM

## 2017-11-24 DIAGNOSIS — I10 Essential (primary) hypertension: Secondary | ICD-10-CM

## 2017-11-24 DIAGNOSIS — I2510 Atherosclerotic heart disease of native coronary artery without angina pectoris: Secondary | ICD-10-CM

## 2017-11-24 DIAGNOSIS — E7800 Pure hypercholesterolemia, unspecified: Secondary | ICD-10-CM

## 2017-11-24 DIAGNOSIS — I25708 Atherosclerosis of coronary artery bypass graft(s), unspecified, with other forms of angina pectoris: Secondary | ICD-10-CM

## 2017-11-24 DIAGNOSIS — Z9861 Coronary angioplasty status: Secondary | ICD-10-CM

## 2017-11-24 DIAGNOSIS — Z23 Encounter for immunization: Secondary | ICD-10-CM

## 2017-11-24 NOTE — Patient Instructions (Signed)
Continue your current therapy  I will see you in 6 months.   

## 2017-11-28 ENCOUNTER — Other Ambulatory Visit: Payer: Self-pay | Admitting: Cardiology

## 2017-11-30 ENCOUNTER — Encounter: Payer: Medicare Other | Admitting: Family Medicine

## 2017-12-07 ENCOUNTER — Ambulatory Visit (INDEPENDENT_AMBULATORY_CARE_PROVIDER_SITE_OTHER): Payer: Medicare Other | Admitting: Family Medicine

## 2017-12-07 ENCOUNTER — Encounter: Payer: Self-pay | Admitting: Family Medicine

## 2017-12-07 ENCOUNTER — Other Ambulatory Visit: Payer: Self-pay

## 2017-12-07 VITALS — BP 132/80 | HR 81 | Temp 97.9°F | Ht 66.0 in | Wt 208.4 lb

## 2017-12-07 DIAGNOSIS — R42 Dizziness and giddiness: Secondary | ICD-10-CM

## 2017-12-07 DIAGNOSIS — E1142 Type 2 diabetes mellitus with diabetic polyneuropathy: Secondary | ICD-10-CM

## 2017-12-07 DIAGNOSIS — E119 Type 2 diabetes mellitus without complications: Secondary | ICD-10-CM

## 2017-12-07 DIAGNOSIS — Z7901 Long term (current) use of anticoagulants: Secondary | ICD-10-CM | POA: Diagnosis not present

## 2017-12-07 DIAGNOSIS — I482 Chronic atrial fibrillation, unspecified: Secondary | ICD-10-CM

## 2017-12-07 DIAGNOSIS — Z794 Long term (current) use of insulin: Secondary | ICD-10-CM

## 2017-12-07 DIAGNOSIS — I2510 Atherosclerotic heart disease of native coronary artery without angina pectoris: Secondary | ICD-10-CM

## 2017-12-07 DIAGNOSIS — E782 Mixed hyperlipidemia: Secondary | ICD-10-CM

## 2017-12-07 DIAGNOSIS — Z9861 Coronary angioplasty status: Secondary | ICD-10-CM | POA: Diagnosis not present

## 2017-12-07 DIAGNOSIS — I10 Essential (primary) hypertension: Secondary | ICD-10-CM | POA: Diagnosis not present

## 2017-12-07 LAB — CBC WITH DIFFERENTIAL/PLATELET
Basophils Absolute: 0.2 10*3/uL — ABNORMAL HIGH (ref 0.0–0.1)
Basophils Relative: 2.1 % (ref 0.0–3.0)
Eosinophils Absolute: 0.4 10*3/uL (ref 0.0–0.7)
Eosinophils Relative: 4.2 % (ref 0.0–5.0)
HCT: 37 % — ABNORMAL LOW (ref 39.0–52.0)
Hemoglobin: 11.7 g/dL — ABNORMAL LOW (ref 13.0–17.0)
Lymphocytes Relative: 15.8 % (ref 12.0–46.0)
Lymphs Abs: 1.5 10*3/uL (ref 0.7–4.0)
MCHC: 31.6 g/dL (ref 30.0–36.0)
MCV: 78.6 fl (ref 78.0–100.0)
Monocytes Absolute: 0.7 10*3/uL (ref 0.1–1.0)
Monocytes Relative: 7.9 % (ref 3.0–12.0)
Neutro Abs: 6.5 10*3/uL (ref 1.4–7.7)
Neutrophils Relative %: 70 % (ref 43.0–77.0)
Platelets: 274 10*3/uL (ref 150.0–400.0)
RBC: 4.71 Mil/uL (ref 4.22–5.81)
RDW: 16.6 % — ABNORMAL HIGH (ref 11.5–15.5)
WBC: 9.3 10*3/uL (ref 4.0–10.5)

## 2017-12-07 LAB — COMPREHENSIVE METABOLIC PANEL
ALT: 12 U/L (ref 0–53)
AST: 15 U/L (ref 0–37)
Albumin: 4.4 g/dL (ref 3.5–5.2)
Alkaline Phosphatase: 76 U/L (ref 39–117)
BUN: 19 mg/dL (ref 6–23)
CO2: 31 mEq/L (ref 19–32)
Calcium: 9.8 mg/dL (ref 8.4–10.5)
Chloride: 105 mEq/L (ref 96–112)
Creatinine, Ser: 1.06 mg/dL (ref 0.40–1.50)
GFR: 70.15 mL/min (ref >60.00)
Glucose, Bld: 125 mg/dL — ABNORMAL HIGH (ref 70–99)
Potassium: 5 mEq/L (ref 3.5–5.1)
Sodium: 142 mEq/L (ref 135–145)
Total Bilirubin: 0.7 mg/dL (ref 0.2–1.2)
Total Protein: 7.1 g/dL (ref 6.0–8.3)

## 2017-12-07 LAB — LIPID PANEL
Cholesterol: 146 mg/dL (ref 0–200)
HDL: 44.7 mg/dL (ref >39.00)
LDL Cholesterol: 85 mg/dL (ref 0–99)
NonHDL: 100.86
Total CHOL/HDL Ratio: 3
Triglycerides: 78 mg/dL (ref 0.0–149.0)
VLDL: 15.6 mg/dL (ref 0.0–40.0)

## 2017-12-07 LAB — POCT GLYCOSYLATED HEMOGLOBIN (HGB A1C): Hemoglobin A1C: 7.9 % — AB (ref 4.0–5.6)

## 2017-12-07 LAB — TSH: TSH: 2.39 u[IU]/mL (ref 0.35–4.50)

## 2017-12-07 NOTE — Progress Notes (Signed)
Subjective  CC:  Chief Complaint  Patient presents with  . Diabetes    last a1c 08/01/2017- 7.7, Patient is fasting, HM up to date  . Knee Pain    hurts when he walks, knee gave out this morning.     HPI: [name] is a 84 y.o. male who presents to the office today for follow up of diabetes, hypertension and problems listed above in the chief complaint.   Diabetic f/u: His diabetic control is reported as Unchanged.  Daily fasting sugars are reviewed and run typically below 120.  No lows.  Eats fairly well however will know his sugars go up with a dessert.  No symptoms of hyperglycemia.  Tolerating medicines well.  Due for lab work. He denies exertional CP or SOB or symptomatic hypoglycemia. He denies foot sores.  Neuropathy is well controlled.   Hypertension f/u: Control is good . Pt reports he is doing well. taking medications as instructed, no medication side effects noted, no TIAs, no chest pain on exertion, no dyspnea on exertion, no swelling of ankles.  He denies adverse effects from his BP medications. Compliance with medication is good.    Hyperlipidemia f/u: Patient presents for follow up of lipids. Lipids have been well controlled on meds w/o myalgias or side effects. Compliance with treatment thus far has been good. The patient does use medications that may worsen dyslipidemias (corticosteroids, progestins, anabolic steroids, diuretics, beta-blockers, amiodarone, cyclosporine, olanzapine). The patient exercises occasionally.  He is going to purchase a new mini trainer that helps some ride a bicycle like device well seated in his chair at home.  He no longer goes to the gym.  The patient is known to have coexisting coronary artery disease.   Left knee pain: As he was entering the building today his left knee gave way.  Since he is had some pain with walking internally.  Prior to that he was doing well without any pain, swelling, clicking.  He does have history of osteoarthritis  but that has been asymptomatic recently.  No recent exercise, injury or falls.  No low back pain.  A. fib anticoagulated: No bleeding.  Recent cardiovascular status has been stable.  I reviewed recent cardiology notes.  Vertigo: Currently asymptomatic.  Has been recurrent and chronic in the past.  Health maintenance is up-to-date.  Annual wellness visit next due in April 2020.   Assessment  1. Diabetes mellitus type 2, insulin dependent ([place])   2. Diabetic peripheral neuropathy associated with type 2 diabetes mellitus ([place])   3. Recurrent coronary arteriosclerosis after percutaneous transluminal coronary angioplasty   4. Chronic vertigo   5. Mixed hyperlipidemia   6. Essential (primary) hypertension   7. Chronic atrial fibrillation   8. Current use of long term anticoagulation      Plan   Diabetes is currently adequately controlled.  Goal A1c less than 8.0.  Trending up slightly today.  Recommend checking 2-hour postprandials to help guide his diet.  No medication changes today.  Hypertension is currently well controlled.  Continue current medications.  CAD is stable.  Hyperlipidemia f/u: Check lipids and LFTs today on statin.  A. fib: Rate controlled and anticoagulated without adverse effects  Knee pain: Osteoarthritis flare, meniscal catching or loose joint mouse or possibilities of today's events.  Recommend ice and time.  May wear his knee sleeve.  He will follow-up here if pain increases, swelling develops or recurring knee giving way occurs.  Labs ordered today: We will send letter  out so he can take it to his physician  that care for him at the [place] Diabetic education: ongoing education regarding chronic disease management for diabetes was given today. We continue to reinforce the ABC's of diabetic management: A1c (<7 or 8 dependent upon patient), tight blood pressure control, and cholesterol management with goal LDL < 100 minimally. We discuss diet strategies, exercise  recommendations, medication options and possible side effects. At each visit, we review recommended immunizations and preventive care recommendations for diabetics and stress that good diabetic control can prevent other problems. See below for this patient's data. Hypertension education: ongoing education regarding management of these chronic disease states was given. Management strategies discussed on successive visits include dietary and exercise recommendations, goals of achieving and maintaining IBW, and lifestyle modifications aiming for adequate sleep and minimizing stressors.   Follow up: Return in about 3 months (around 03/09/2018) for follow up Hypertension, recheck..  Orders Placed This Encounter  Procedures  . CBC with Differential/Platelet  . Comprehensive metabolic panel  . Lipid panel  . TSH  . POCT glycosylated hemoglobin (Hb A1C)   No orders of the defined types were placed in this encounter.     I reviewed the patients updated PMH, FH, and SocHx.  Patient Active Problem List   Diagnosis Date Noted  . Diabetic peripheral neuropathy associated with type 2 diabetes mellitus ([place]) 05/05/2017    Priority: High  . Atherosclerotic heart disease of native coronary artery without angina pectoris 06/13/2016    Priority: High  . CVA (cerebral vascular accident) ([place]) 06/06/2016    Priority: High  . Recurrent coronary arteriosclerosis after percutaneous transluminal coronary angioplasty 04/18/2015    Priority: High  . CAD S/P PCI- Nov 2016     Priority: High  . Current use of long term anticoagulation 02/06/2013    Priority: High  . Chronic atrial fibrillation 07/30/2011    Priority: High  . Hx of CABG x 2 1990     Priority: High  . Essential (primary) hypertension     Priority: High  . Diabetes mellitus type 2, insulin dependent ([place])     Priority: High  . Hyperlipemia     Priority: High  . Type 2 diabetes mellitus ([place]) 01/15/2010    Priority: High  . Chronic vertigo  03/15/2014    Priority: Medium  . Obesity (BMI 30.0-34.9) 01/31/2012    Priority: Medium  . Enlarged prostate without lower urinary tract symptoms (luts) 01/04/2011    Priority: Medium  . Gastro-esophageal reflux disease without esophagitis 12/07/2010    Priority: Medium  . Epistaxis, recurrent 04/11/2015    Priority: Low  . Osteoarthritis, knee 01/31/2012    Priority: Low  . Allergic rhinitis 10/28/2011    Priority: Low  . Hearing loss 06/08/2011    Priority: Low  . Primary osteoarthritis of hand 06/08/2011    Priority: Low  . Hematuria 08/27/2008    Priority: Low  . Leukocytosis 06/02/2016  . Carotid stenosis 06/02/2016   Immunization History  Administered Date(s) Administered  . Influenza Split 11/14/2007, 01/01/2009, 12/23/2009  . Influenza, High Dose Seasonal PF 11/02/2012, 12/12/2013, 11/15/2014, 11/24/2017  . Influenza, Seasonal, Injecte, Preservative Fre 11/18/2015  . Influenza,trivalent, recombinat, inj, PF 12/07/2010  . Influenza-Unspecified 10/28/2011, 11/30/2014, 02/15/2017  . Pneumococcal Conjugate-13 12/13/2013  . Pneumococcal-Unspecified 08/20/2008  . Td 05/29/2002  . Tdap 10/28/2011, 02/15/2017  . Zoster 08/20/2008  . Zoster Recombinat (Shingrix) 10/13/2016, 12/13/2016   Health Maintenance  Topic Date Due  . HEMOGLOBIN A1C  01/31/2018  .  OPHTHALMOLOGY EXAM  06/09/2018  . FOOT EXAM  08/02/2018  . URINE MICROALBUMIN  08/02/2018  . TETANUS/TDAP  02/16/2027  . INFLUENZA VACCINE  Completed  . PNA vac Low Risk Adult  Completed   Diabetes and HTN Related Lab Review: Lab Results  Component Value Date   HGBA1C 7.9 (A) 12/07/2017   HGBA1C 7.7 (A) 08/01/2017   HGBA1C 7.7 05/05/2017    Lab Results  Component Value Date   MICROALBUR 3.5 (H) 08/01/2017   Lab Results  Component Value Date   CREATININE 1.4 (A) 07/20/2017   BUN 16 07/18/2017   NA 141 07/20/2017   K 5.0 07/20/2017   CL 105 07/18/2017   CO2 26 07/18/2017   Lab Results  Component Value  Date   CHOL 159 07/20/2017   CHOL 178 06/07/2016   Lab Results  Component Value Date   HDL 42 07/20/2017   HDL 45 06/07/2016   Lab Results  Component Value Date   LDLCALC 89 07/20/2017   LDLCALC 61 01/11/2017   LDLCALC 106 (H) 06/07/2016   Lab Results  Component Value Date   TRIG 139 07/20/2017   TRIG 133 06/07/2016   Lab Results  Component Value Date   CHOLHDL 4.0 06/07/2016   No results found for: LDLDIRECT The ASCVD Risk score Mikey Bussing DC Jr., et al., 2013) failed to calculate for the following reasons:   The 2013 ASCVD risk score is only valid for ages 47 to 67   The patient has a prior MI or stroke diagnosis  BP Readings from Last 3 Encounters:  12/07/17 132/80  11/24/17 (!) 110/52  09/02/17 138/80   Wt Readings from Last 3 Encounters:  12/07/17 208 lb 6.4 oz (94.5 kg)  11/24/17 206 lb (93.4 kg)  09/02/17 204 lb 9.6 oz (92.8 kg)    Allergies: Patient is allergic to erythromycin base; amoxicillin; cefadroxil; ciprofloxacin; and erythromycin. Family History: Patient family history includes Brain cancer in his father; Throat cancer in his brother. Social History:  Patient  reports that he quit smoking about 59 years ago. His smoking use included cigarettes. He has a 30.00 pack-year smoking history. He has never used smokeless tobacco. He reports that he does not drink alcohol or use drugs.  Review of Systems: Ophthalmic: negative for eye pain, loss of vision or double vision Cardiovascular: negative for chest pain Respiratory: negative for SOB or persistent cough Gastrointestinal: negative for abdominal pain Genitourinary: negative for dysuria or gross hematuria MSK: negative for foot lesions Neurologic: negative for weakness or gait disturbance Current Meds  Medication Sig  . apixaban (ELIQUIS) 5 MG TABS tablet Take 1 tablet (5 mg total) by mouth 2 (two) times daily.  [name] atorvastatin (LIPITOR) 40 MG tablet Take 1 tablet (40 mg total) by mouth daily at 6 PM.    . AVODART 0.5 MG capsule Take 0.5 mg by mouth every other day.   . diltiazem (CARDIZEM CD) 360 MG 24 hr capsule Take 1 capsule (360 mg total) by mouth daily.  . empagliflozin (JARDIANCE) 10 MG TABS tablet Take 10 mg by mouth daily.  . insulin glargine (LANTUS) 100 UNIT/ML injection Inject 38 Units into the skin at bedtime.  . metFORMIN (GLUCOPHAGE) 500 MG tablet Take 500 mg by mouth 2 (two) times daily with a meal.  . metoprolol succinate (TOPROL-XL) 100 MG 24 hr tablet TAKE 1 TABLET BY MOUTH TWICE DAILY  . pantoprazole (PROTONIX) 40 MG tablet TAKE 1 TABLET BY MOUTH DAILY AT NOON  . PRECISION XTRA  TEST STRIPS test strip TEST BLOOD SUGAR 1-2 TIMES DAILY  . Tamsulosin HCl (FLOMAX) 0.4 MG CAPS Take 0.4 mg by mouth every other day.     Objective  Vitals: BP 132/80   Pulse 81   Temp 97.9 F (36.6 C)   Ht 5\' 6"  (1.676 m)   Wt 208 lb 6.4 oz (94.5 kg)   SpO2 98%   BMI 33.64 kg/m  General: well appearing, no acute distress, ambulating with a cane Psych:  Alert and oriented, normal mood and affect, appears happy HEENT:  Normocephalic, atraumatic, moist mucous membranes, supple neck  Cardiovascular: Irregularly irregular without murmur, no lower extremity edema respiratory:  Good breath sounds bilaterally, CTAB with normal effort, no rales Gastrointestinal: normal BS, soft, nontender Skin:  Warm, no rashes Neurologic:   Mental status is normal. normal gait Left knee exam: No effusion, warmth or redness.  Extension mildly limited, flexion full with mild posterior tenderness.  No joint line tenderness.  No anterior tenderness.  Negative McMurray's no laxity  Commons side effects, risks, benefits, and alternatives for medications and treatment plan prescribed today were discussed, and the patient expressed understanding of the given instructions. Patient is instructed to call or message via MyChart if he/she has any questions or concerns regarding our treatment plan. No barriers to understanding  were identified. We discussed Red Flag symptoms and signs in detail. Patient expressed understanding regarding what to do in case of urgent or emergency type symptoms.   Medication list was reconciled, printed and provided to the patient in AVS. Patient instructions and summary information was reviewed with the patient as documented in the AVS. This note was prepared with assistance of Dragon voice recognition software. Occasional wrong-word or sound-a-like substitutions may have occurred due to the inherent limitations of voice recognition software

## 2017-12-07 NOTE — Patient Instructions (Signed)
Please return in 3 months for diabetes follow up   If you have any questions or concerns, please don't hesitate to send me a message via MyChart or call the office at [phone]. Thank you for visiting with [place] today! It's our pleasure caring for you.  Ice your knee 2-3x/day for the next few days. Wear the brace. If it worsens, come back for evaluation.   We will call you with your lab results.

## 2017-12-08 ENCOUNTER — Telehealth: Payer: Self-pay | Admitting: *Deleted

## 2017-12-08 ENCOUNTER — Ambulatory Visit (HOSPITAL_COMMUNITY)
Admission: RE | Admit: 2017-12-08 | Discharge: 2017-12-08 | Disposition: A | Discharge status: Home / Self Care | Payer: Medicare Other | Source: Ambulatory Visit | Attending: Family | Admitting: Family

## 2017-12-08 ENCOUNTER — Encounter: Payer: Medicare Other | Admitting: Family

## 2017-12-08 ENCOUNTER — Encounter: Payer: Self-pay | Admitting: Family Medicine

## 2017-12-08 ENCOUNTER — Ambulatory Visit (INDEPENDENT_AMBULATORY_CARE_PROVIDER_SITE_OTHER): Payer: Medicare Other

## 2017-12-08 DIAGNOSIS — I6523 Occlusion and stenosis of bilateral carotid arteries: Secondary | ICD-10-CM

## 2017-12-08 DIAGNOSIS — M25562 Pain in left knee: Secondary | ICD-10-CM | POA: Diagnosis not present

## 2017-12-08 DIAGNOSIS — M1712 Unilateral primary osteoarthritis, left knee: Secondary | ICD-10-CM | POA: Diagnosis not present

## 2017-12-08 NOTE — Progress Notes (Signed)
Please call patient: I have reviewed his/her lab results. Xray looks mostly good. May have a small amount of fluid in joint and mild arthritis. Return tomorrow for reeval if needed.

## 2017-12-08 NOTE — Progress Notes (Signed)
Stable labs. Please call patient to notify him and send letter.

## 2017-12-08 NOTE — Telephone Encounter (Signed)
Discussed recommendations with Patient. He states he will go to [place] at [facility] today to get X-Ray. I advised him about Provider medication recommendations. He verbalized understanding  Doloris Hall,  LPN

## 2017-12-08 NOTE — Progress Notes (Signed)
Lab results mailed to patient in letter. Normal results. No action / follow up needed on these results.

## 2017-12-08 NOTE — Progress Notes (Signed)
   Chief Complaint: Follow up Extracranial Carotid Artery Stenosis     [name] is a 84 y.o. male who left before I could evaluate him.  Carotid duplex today showed 1-39% bilateral ICA stenosis. Bilateral vertebral artery flow is antegrade.  Bilateral subclavian artery waveforms are normal.  No change from the previous exam on 12-02-16.  I spoke with pt on the phone at 6:10 pm, and discussed his carotid duplex results.  Fortunately he quit smoking in 1964, and states his DM is under management by Dr. Dow Adolph.   He has no history of stroke or TIA, he does have vertigo.   I advised that he follow up in 2 years with carotid duplex.

## 2017-12-08 NOTE — Telephone Encounter (Signed)
Patient called in and states that his knee is just as bad this morning.  He said that he took some pain pills this morning, iced his knee and it is still very painful.  He states he hardly got any sleep last night. He is asking if there is anyway we can order an x-ray for him because he can't take this pain anymore.

## 2017-12-08 NOTE — Telephone Encounter (Signed)
Please call patient.  I have ordered an x-ray.  Patient can go get this done today at the nearest facility to his home.  Please see what that would be, [facility] on [address] etc.  Take Tylenol Extra Strength to twice daily.  May use Voltaren gel, please order if needed.  He can follow-up here or with orthopedics if pain persists for further evaluation and treatment, may warrant a steroid injection or further imaging.  I well let him know about the x-ray results today.

## 2017-12-09 ENCOUNTER — Other Ambulatory Visit: Payer: Self-pay

## 2017-12-09 ENCOUNTER — Ambulatory Visit (INDEPENDENT_AMBULATORY_CARE_PROVIDER_SITE_OTHER): Payer: Medicare Other | Admitting: Family Medicine

## 2017-12-09 DIAGNOSIS — M2392 Unspecified internal derangement of left knee: Secondary | ICD-10-CM

## 2017-12-09 DIAGNOSIS — M25462 Effusion, left knee: Secondary | ICD-10-CM

## 2017-12-09 MED ORDER — TRIAMCINOLONE ACETONIDE 40 MG/ML IJ SUSP
40.0000 mg | Freq: Once | INTRAMUSCULAR | Status: AC
  Administered 2017-12-09: 40 mg via INTRA_ARTICULAR

## 2017-12-09 MED ORDER — HYDROCODONE-ACETAMINOPHEN 5-325 MG PO TABS: 1.0000 | ORAL_TABLET | Freq: Four times a day (QID) | ORAL | 0 refills | Status: DC | PRN

## 2017-12-09 MED ORDER — KNEE BRACE ADJUSTABLE HINGED MISC: 0 refills | Status: DC

## 2017-12-09 NOTE — Patient Instructions (Signed)
Please make an appointment with Dr. Maureen Ralphs for follow up in the next 2 weeks.   If you have any questions or concerns, please don't hesitate to send me a message via MyChart or call the office at [phone]. Thank you for visiting with [name] today! It's our pleasure caring for you.  Knee Injection, Care After Refer to this sheet in the next few weeks. These instructions provide you with information about caring for yourself after your procedure. Your health care provider may also give you more specific instructions. Your treatment has been planned according to current medical practices, but problems sometimes occur. Call your health care provider if you have any problems or questions after your procedure. What can I expect after the procedure? After the procedure, it is common to have:  Soreness.  Warmth.  Swelling.  You may have more pain, swelling, and warmth than you did before the injection. This reaction may last for about one day. Follow these instructions at home: Bathing  If you were given a bandage (dressing), keep it dry until your health care provider says it can be removed. Ask your health care provider when you can start showering or taking a bath. Managing pain, stiffness, and swelling  If directed, apply ice to the injection area: ? Put ice in a plastic bag. ? Place a towel between your skin and the bag. ? Leave the ice on for 20 minutes, 2-3 times per day.  Do not apply heat to your knee.  Raise the injection area above the level of your heart while you are sitting or lying down. Activity  Avoid strenuous activities for as long as directed by your health care provider. Ask your health care provider when you can return to your normal activities. General instructions  Take medicines only as directed by your health care provider.  Do not take aspirin or other over-the-counter medicines unless your health care provider says you can.  Check your injection site every  day for signs of infection. Watch for: ? Redness, swelling, or pain. ? Fluid, blood, or pus.  Follow your health care provider's instructions about dressing changes and removal. Contact a health care provider if:  You have symptoms at your injection site that last longer than two days after your procedure.  You have redness, swelling, or pain in your injection area.  You have fluid, blood, or pus coming from your injection site.  You have warmth in your injection area.  You have a fever.  Your pain is not controlled with medicine. Get help right away if:  Your knee turns very red.  Your knee becomes very swollen.  Your knee pain is severe. This information is not intended to replace advice given to you by your health care provider. Make sure you discuss any questions you have with your health care provider. Document Released: 03/08/2014 Document Revised: 10/22/2015 Document Reviewed: 12/26/2013 Elsevier Interactive Patient Education  [employer].

## 2017-12-10 ENCOUNTER — Encounter: Payer: Self-pay | Admitting: Family Medicine

## 2017-12-10 NOTE — Progress Notes (Signed)
Subjective  CC:  Chief Complaint  Patient presents with  . Knee Pain    x 2 days, patient states that his pain is better today than yesterday but still a 7/10    HPI: Russell Quinney is a 84 y.o. male who presents to the office today to address the problems listed above in the chief complaint. Please see last note.  Patient was here 2 days ago and suffered injury to left knee while walking towards the registration desk.  Reports his left knee gave out.  Since, he had increased swelling and increased pain.  He has been unable to sleep due to the pain.  Worse pain with walking.  He denies fevers, chills or calf pain.  X-rays from yesterday revealed minimal osteoarthritis and small knee effusion.  He tried Tylenol and tramadol, from his wife's prescription, but neither gave him relief.  I reviewed the patients updated PMH, FH, and SocHx.    Patient Active Problem List   Diagnosis Date Noted  . Diabetic peripheral neuropathy associated with type 2 diabetes mellitus ([place]) 05/05/2017    Priority: High  . Atherosclerotic heart disease of native coronary artery without angina pectoris 06/13/2016    Priority: High  . CVA (cerebral vascular accident) ([place]) 06/06/2016    Priority: High  . Recurrent coronary arteriosclerosis after percutaneous transluminal coronary angioplasty 04/18/2015    Priority: High  . CAD S/P PCI- Nov 2016     Priority: High  . Current use of long term anticoagulation 02/06/2013    Priority: High  . Chronic atrial fibrillation 07/30/2011    Priority: High  . Hx of CABG x 2 1990     Priority: High  . Essential (primary) hypertension     Priority: High  . Diabetes mellitus type 2, insulin dependent ([place])     Priority: High  . Hyperlipemia     Priority: High  . Type 2 diabetes mellitus ([place]) 01/15/2010    Priority: High  . Chronic vertigo 03/15/2014    Priority: Medium  . Obesity (BMI 30.0-34.9) 01/31/2012    Priority: Medium  . Enlarged prostate without lower  urinary tract symptoms (luts) 01/04/2011    Priority: Medium  . Gastro-esophageal reflux disease without esophagitis 12/07/2010    Priority: Medium  . Epistaxis, recurrent 04/11/2015    Priority: Low  . Osteoarthritis, knee 01/31/2012    Priority: Low  . Allergic rhinitis 10/28/2011    Priority: Low  . Hearing loss 06/08/2011    Priority: Low  . Primary osteoarthritis of hand 06/08/2011    Priority: Low  . Hematuria 08/27/2008    Priority: Low  . Leukocytosis 06/02/2016  . Carotid stenosis 06/02/2016   No outpatient medications have been marked as taking for the 12/09/17 encounter (Office Visit) with [name], MD.    Allergies: Patient is allergic to erythromycin base; amoxicillin; cefadroxil; ciprofloxacin; and erythromycin.  Review of Systems: Constitutional: Negative for fever malaise or anorexia Cardiovascular: negative for chest pain Respiratory: negative for SOB or persistent cough Gastrointestinal: negative for abdominal pain  Objective  Vitals: There were no vitals taken for this visit. General: He appears tired, alert and oriented x3, able to get to the exam table with some assistance  knee:  Left no collateral ligament laxity and warm without erythema, effusion present, medial joint line tenderness present, full range of motion with pain with full extension.  Antalgic gait, using a walker for assistance. Skin:  Warm, no rashes  Procedure note for Knee Joint  Aspiration and/or Injection:  Indications: pain relief, failed conservative therapies  Knee Arthrocentesis with Aspiration and Injection Procedure Note  Pre-operative Diagnosis: left internal derangement of knee, possible meniscus tear  Post-operative Diagnosis: same  Indications: Symptomatic relief of large effusion  Anesthesia: Lidocaine 1% without epinephrine without added sodium bicarbonate  Procedure Details   Verbal consent was obtained for the procedure. Universal time out taken.  The  Knee joint was prepped with alcohol and an 18 gauge needle was inserted into the joint from the lateral approach.  25 ml of serosanguinous fluid was removed from the joint and discarded. Four ml 1% lidocaine and one ml of triamcinolone (KENALOG) 40mg /ml was then injected into the joint through the same needle. The needle was removed and the area cleansed and dressed.  Complications:  None; patient tolerated the procedure well.  He felt improved after the procedure.     Assessment  1. Knee effusion, left   2. Internal derangement of knee, left      Plan   S/p intra-articular knee joint aspiration and steroid injection:  Routine post procedure care discussed. Rest, Ice, Nsaids as needed and ROM exercises recommended. F/u care printed out for patient in AVS.   Discussed urgent f/u if develops increased pain, redness or fever.  Rec follow-up with orthopedics for further evaluation.  Follow up: Return if symptoms worsen or fail to improve.    Commons side effects, risks, benefits, and alternatives for medications and treatment plan prescribed today were discussed, and the patient expressed understanding of the given instructions. Patient is instructed to call or message via MyChart if he/she has any questions or concerns regarding our treatment plan. No barriers to understanding were identified. We discussed Red Flag symptoms and signs in detail. Patient expressed understanding regarding what to do in case of urgent or emergency type symptoms.   Medication list was reconciled, printed and provided to the patient in AVS. Patient instructions and summary information was reviewed with the patient as documented in the AVS. This note was prepared with assistance of Dragon voice recognition software. Occasional wrong-word or sound-a-like substitutions may have occurred due to the inherent limitations of voice recognition software  No orders of the defined types were placed in this encounter.  Meds  ordered this encounter  Medications  . triamcinolone acetonide (KENALOG-40) injection 40 mg  . Elastic Bandages & Supports (KNEE BRACE ADJUSTABLE HINGED) MISC    Sig: Wear daily as tolerated    Dispense:  1 each    Refill:  0  . HYDROcodone-acetaminophen (NORCO) 5-325 MG tablet    Sig: Take 1 tablet by mouth every 6 (six) hours as needed for severe pain.    Dispense:  20 tablet    Refill:  0

## 2017-12-15 ENCOUNTER — Encounter: Payer: Self-pay | Admitting: Family Medicine

## 2017-12-15 ENCOUNTER — Ambulatory Visit (INDEPENDENT_AMBULATORY_CARE_PROVIDER_SITE_OTHER): Payer: Medicare Other | Admitting: Family Medicine

## 2017-12-15 ENCOUNTER — Other Ambulatory Visit: Payer: Self-pay

## 2017-12-15 VITALS — BP 122/80 | HR 94 | Temp 97.3°F | Ht 66.0 in

## 2017-12-15 DIAGNOSIS — E119 Type 2 diabetes mellitus without complications: Secondary | ICD-10-CM | POA: Diagnosis not present

## 2017-12-15 DIAGNOSIS — R5383 Other fatigue: Secondary | ICD-10-CM | POA: Diagnosis not present

## 2017-12-15 DIAGNOSIS — T50905A Adverse effect of unspecified drugs, medicaments and biological substances, initial encounter: Secondary | ICD-10-CM | POA: Diagnosis not present

## 2017-12-15 DIAGNOSIS — R739 Hyperglycemia, unspecified: Secondary | ICD-10-CM

## 2017-12-15 DIAGNOSIS — Z794 Long term (current) use of insulin: Secondary | ICD-10-CM | POA: Diagnosis not present

## 2017-12-15 DIAGNOSIS — D649 Anemia, unspecified: Secondary | ICD-10-CM | POA: Diagnosis not present

## 2017-12-15 LAB — POCT URINALYSIS DIPSTICK
Bilirubin, UA: NEGATIVE
Blood, UA: NEGATIVE
Glucose, UA: POSITIVE — AB
Ketones, UA: NEGATIVE
Leukocytes, UA: NEGATIVE
Nitrite, UA: NEGATIVE
Protein, UA: NEGATIVE
Spec Grav, UA: 1.01 (ref 1.010–1.025)
Urobilinogen, UA: 0.2 E.U./dL
pH, UA: 6 (ref 5.0–8.0)

## 2017-12-15 LAB — POCT CBG (FASTING - GLUCOSE)-MANUAL ENTRY: Glucose Fasting, POC: 280 mg/dL — AB (ref 70–99)

## 2017-12-15 NOTE — Progress Notes (Signed)
Subjective  CC:  Chief Complaint  Patient presents with  . Fatigue    patient states that his blood sugars have been elevated, this morning it was 210 fasting and he feels drained     HPI: [name] is a 84 y.o. male who presents to the office today to address the problems listed above in the chief complaint.  Patient returns today afternoon fatigue.  He was here last week due to knee injury, status post steroid injection which helped him considerably.  Within 24 to 48 hours after steroid injection his left knee pain resolved.  Since the swelling is down and he is walking around feeling much better.  The only needed one hydrocodone.  However, he feels fatigued.  Over the last 2 to 3 days he is noticed generalized weakness and fatigue happening in the afternoon.  His blood sugars have been elevated running in the 200s.  He denies chest pain, palpitations, shortness of breath, neurologic deficits, headache, urinary symptoms, abdominal pain, joint pain.  He has been sleeping well.  His multiple well-controlled chronic medical problems.  See problem list below.  He denies lightheadedness Assessment  1. Fatigue, unspecified type   2. Normocytic anemia   3. Diabetes mellitus type 2, insulin dependent ([place])   4. Hyperglycemia, drug-induced      Plan   Fatigue: Multiple possible etiologies but most likely related to drug-induced hyperglycemia due to steroid injection.  Check lab work.  Has history of chronic mild anemia.  Insurance not worsening.  He is on a blood thinner.  His symptoms are not consistent with angina.  There are no signs or symptoms of infection.  Check urine.  Recommend rest, good nutrition, fluids, increasing metformin to 1000 twice daily for the next week.  Follow-up if symptoms persist  Follow up: Return if symptoms worsen or fail to improve.   Orders Placed This Encounter  Procedures  . Anemia Profile B  . POCT CBG (Fasting - Glucose)  . POCT urinalysis dipstick    No orders of the defined types were placed in this encounter.     I reviewed the patients updated PMH, FH, and SocHx.    Patient Active Problem List   Diagnosis Date Noted  . Diabetic peripheral neuropathy associated with type 2 diabetes mellitus ([place]) 05/05/2017    Priority: High  . Atherosclerotic heart disease of native coronary artery without angina pectoris 06/13/2016    Priority: High  . CVA (cerebral vascular accident) ([place]) 06/06/2016    Priority: High  . Recurrent coronary arteriosclerosis after percutaneous transluminal coronary angioplasty 04/18/2015    Priority: High  . CAD S/P PCI- Nov 2016     Priority: High  . Current use of long term anticoagulation 02/06/2013    Priority: High  . Chronic atrial fibrillation 07/30/2011    Priority: High  . Hx of CABG x 2 1990     Priority: High  . Essential (primary) hypertension     Priority: High  . Diabetes mellitus type 2, insulin dependent ([place])     Priority: High  . Hyperlipemia     Priority: High  . Type 2 diabetes mellitus ([place]) 01/15/2010    Priority: High  . Chronic vertigo 03/15/2014    Priority: Medium  . Obesity (BMI 30.0-34.9) 01/31/2012    Priority: Medium  . Enlarged prostate without lower urinary tract symptoms (luts) 01/04/2011    Priority: Medium  . Gastro-esophageal reflux disease without esophagitis 12/07/2010    Priority: Medium  .  Epistaxis, recurrent 04/11/2015    Priority: Low  . Osteoarthritis, knee 01/31/2012    Priority: Low  . Allergic rhinitis 10/28/2011    Priority: Low  . Hearing loss 06/08/2011    Priority: Low  . Primary osteoarthritis of hand 06/08/2011    Priority: Low  . Hematuria 08/27/2008    Priority: Low  . Leukocytosis 06/02/2016  . Carotid stenosis 06/02/2016   Current Meds  Medication Sig  . apixaban (ELIQUIS) 5 MG TABS tablet Take 1 tablet (5 mg total) by mouth 2 (two) times daily.  [name] atorvastatin (LIPITOR) 40 MG tablet Take 1 tablet (40 mg total) by mouth daily  at 6 PM.  . AVODART 0.5 MG capsule Take 0.5 mg by mouth every other day.   . diltiazem (CARDIZEM CD) 360 MG 24 hr capsule Take 1 capsule (360 mg total) by mouth daily.  Regino Schultze Bandages & Supports (KNEE BRACE ADJUSTABLE HINGED) MISC Wear daily as tolerated  . empagliflozin (JARDIANCE) 10 MG TABS tablet Take 10 mg by mouth daily.  [name] HYDROcodone-acetaminophen (NORCO) 5-325 MG tablet Take 1 tablet by mouth every 6 (six) hours as needed for severe pain.  [name] insulin glargine (LANTUS) 100 UNIT/ML injection Inject 40 Units into the skin at bedtime.   . metFORMIN (GLUCOPHAGE) 500 MG tablet Take 500 mg by mouth 2 (two) times daily with a meal.  . metoprolol succinate (TOPROL-XL) 100 MG 24 hr tablet TAKE 1 TABLET BY MOUTH TWICE DAILY  . nitroGLYCERIN (NITROSTAT) 0.4 MG SL tablet Place 0.4 mg under the tongue every 5 (five) minutes as needed for chest pain (x 3 doses).  . pantoprazole (PROTONIX) 40 MG tablet TAKE 1 TABLET BY MOUTH DAILY AT NOON  . PRECISION XTRA TEST STRIPS test strip TEST BLOOD SUGAR 1-2 TIMES DAILY  . Tamsulosin HCl (FLOMAX) 0.4 MG CAPS Take 0.4 mg by mouth every other day.     Allergies: Patient is allergic to erythromycin base; amoxicillin; cefadroxil; ciprofloxacin; and erythromycin. Family History: Patient family history includes Brain cancer in his father; Throat cancer in his brother. Social History:  Patient  reports that he quit smoking about 59 years ago. His smoking use included cigarettes. He has a 30.00 pack-year smoking history. He has never used smokeless tobacco. He reports that he does not drink alcohol or use drugs.  Review of Systems: Constitutional: Negative for fever malaise or anorexia Cardiovascular: negative for chest pain Respiratory: negative for SOB or persistent cough Gastrointestinal: negative for abdominal pain BP Readings from Last 3 Encounters:  12/15/17 122/80  12/07/17 132/80  11/24/17 (!) 110/52    Objective  Vitals: BP 122/80   Pulse 94    Temp (!) 97.3 F (36.3 C)   Ht 5\' 6"  (1.676 m)   BMI 33.64 kg/m  General: no acute distress , A&Ox3, appears well but fatigued.  Normal speech. HEENT: PEERL, conjunctiva normal, Oropharynx moist,neck is supple Cardiovascular: Irregularly irregular Respiratory:  Good breath sounds bilaterally, CTAB with normal respiratory effort Skin:  Warm, no rashes Left knee, no longer with effusion, nontender without warmth No lower extremity edema Office Visit on 12/15/2017  Component Date Value Ref Range Status  . Glucose Fasting, POC 12/15/2017 280* 70 - 99 mg/dL Final  . Color, UA 12/15/2017 yellow   Final  . Clarity, UA 12/15/2017 clear   Final  . Glucose, UA 12/15/2017 Positive* Negative Final  . Bilirubin, UA 12/15/2017 negative   Final  . Ketones, UA 12/15/2017 negative   Final  . Roderic Scarce,  UA 12/15/2017 1.010  1.010 - 1.025 Final  . Blood, UA 12/15/2017 negative   Final  . pH, UA 12/15/2017 6.0  5.0 - 8.0 Final  . Protein, UA 12/15/2017 Negative  Negative Final  . Urobilinogen, UA 12/15/2017 0.2  0.2 or 1.0 E.U./dL Final  . Nitrite, UA 12/15/2017 negative   Final  . Leukocytes, UA 12/15/2017 Negative  Negative Final        Commons side effects, risks, benefits, and alternatives for medications and treatment plan prescribed today were discussed, and the patient expressed understanding of the given instructions. Patient is instructed to call or message via MyChart if he/she has any questions or concerns regarding our treatment plan. No barriers to understanding were identified. We discussed Red Flag symptoms and signs in detail. Patient expressed understanding regarding what to do in case of urgent or emergency type symptoms.   Medication list was reconciled, printed and provided to the patient in AVS. Patient instructions and summary information was reviewed with the patient as documented in the AVS. This note was prepared with assistance of Dragon voice recognition software.  Occasional wrong-word or sound-a-like substitutions may have occurred due to the inherent limitations of voice recognition software

## 2017-12-15 NOTE — Patient Instructions (Signed)
Please return if not improving.   If you have any questions or concerns, please don't hesitate to send me a message via MyChart or call the office at [phone]. Thank you for visiting with [place] today! It's our pleasure caring for you.

## 2017-12-16 LAB — ANEMIA PROFILE B
%SAT: 12 % (calc) — ABNORMAL LOW (ref 20–48)
ABS Retic: 52800 cells/uL (ref 25000–9000)
Basophils Absolute: 45 cells/uL (ref 0–200)
Basophils Relative: 0.3 %
Eosinophils Absolute: 302 cells/uL (ref 15–500)
Eosinophils Relative: 2 %
Ferritin: 100 ng/mL (ref 24–380)
Folate: 19.3 ng/mL
HCT: 42.5 % (ref 38.5–50.0)
Hemoglobin: 13.3 g/dL (ref 13.2–17.1)
Iron: 43 ug/dL — ABNORMAL LOW (ref 50–180)
Lymphs Abs: 2235 cells/uL (ref 850–3900)
MCH: 25.2 pg — ABNORMAL LOW (ref 27.0–33.0)
MCHC: 31.3 g/dL — ABNORMAL LOW (ref 32.0–36.0)
MCV: 80.5 fL (ref 80.0–100.0)
MPV: 10.5 fL (ref 7.5–12.5)
Monocytes Relative: 5.9 %
Neutro Abs: 11627 cells/uL — ABNORMAL HIGH (ref 1500–7800)
Neutrophils Relative %: 77 %
Platelets: 405 10*3/uL — ABNORMAL HIGH (ref 140–400)
RBC: 5.28 10*6/uL (ref 4.20–5.80)
RDW: 15 % (ref 11.0–15.0)
Retic Ct Pct: 1 %
TIBC: 362 mcg/dL (calc) (ref 250–425)
Total Lymphocyte: 14.8 %
Vitamin B-12: 687 pg/mL (ref 200–1100)
WBC mixed population: 891 cells/uL (ref 200–950)
WBC: 15.1 10*3/uL — ABNORMAL HIGH (ref 3.8–10.8)

## 2017-12-18 NOTE — Progress Notes (Signed)
Please call patient: I have reviewed his/her lab results. His blood work looks like he may have an infection. Please call him to see how he is feeling, any new sxs?? Will need repeat cbc in one week if he is stable; but further eval if feeling worse.

## 2017-12-21 ENCOUNTER — Other Ambulatory Visit: Payer: Self-pay | Admitting: *Deleted

## 2017-12-21 DIAGNOSIS — R5383 Other fatigue: Secondary | ICD-10-CM

## 2017-12-22 ENCOUNTER — Other Ambulatory Visit (INDEPENDENT_AMBULATORY_CARE_PROVIDER_SITE_OTHER): Payer: Medicare Other

## 2017-12-22 DIAGNOSIS — R5383 Other fatigue: Secondary | ICD-10-CM

## 2017-12-22 LAB — CBC WITH DIFFERENTIAL/PLATELET
Basophils Absolute: 0.1 10*3/uL (ref 0.0–0.1)
Basophils Relative: 1.2 % (ref 0.0–3.0)
Eosinophils Absolute: 0.2 10*3/uL (ref 0.0–0.7)
Eosinophils Relative: 2.1 % (ref 0.0–5.0)
HCT: 36.9 % — ABNORMAL LOW (ref 39.0–52.0)
Hemoglobin: 11.6 g/dL — ABNORMAL LOW (ref 13.0–17.0)
Lymphocytes Relative: 15.8 % (ref 12.0–46.0)
Lymphs Abs: 1.8 10*3/uL (ref 0.7–4.0)
MCHC: 31.5 g/dL (ref 30.0–36.0)
MCV: 78.8 fl (ref 78.0–100.0)
Monocytes Absolute: 0.6 10*3/uL (ref 0.1–1.0)
Monocytes Relative: 5.6 % (ref 3.0–12.0)
Neutro Abs: 8.7 10*3/uL — ABNORMAL HIGH (ref 1.4–7.7)
Neutrophils Relative %: 75.3 % (ref 43.0–77.0)
Platelets: 297 10*3/uL (ref 150.0–400.0)
RBC: 4.68 Mil/uL (ref 4.22–5.81)
RDW: 17.1 % — ABNORMAL HIGH (ref 11.5–15.5)
WBC: 11.5 10*3/uL — ABNORMAL HIGH (ref 4.0–10.5)

## 2017-12-22 NOTE — Progress Notes (Signed)
Please call patient: I have reviewed his/her lab results. Blood counts are improved. Almost back to normal/baseline. Seems he had a viral infection that is resolving. Should do fine. Return if symptoms return. Thanks.

## 2017-12-26 ENCOUNTER — Ambulatory Visit: Payer: Self-pay

## 2017-12-26 NOTE — Telephone Encounter (Signed)
Pt called with C/O dizziness that came on about a week ago. He states he has been treated for dizziness for about 5 years had has been taught exercises to relieve the symptoms but the symptoms have not been relieved by these lately. He states that some days he can't get out of bead. Today the symptoms are not as bad and he has just returned from a shopping trip with his wife. Appointment made per protocol. Care advice read to patient. Pt verbalized understanding of instructions.  Reason for Disposition . [1] MODERATE dizziness (e.g., interferes with normal activities) AND [2] has been evaluated by physician for this  Answer Assessment - Initial Assessment Questions 1. DESCRIPTION: "Describe your dizziness."     Off balance 2. LIGHTHEADED: "Do you feel lightheaded?" (e.g., somewhat faint, woozy, weak upon standing)     Lightheaded, no fainting 3. VERTIGO: "Do you feel like either you or the room is spinning or tilting?" (i.e. vertigo)     no 4. SEVERITY: "How bad is it?"  "Do you feel like you are going to faint?" "Can you stand and walk?"   - MILD - walking normally   - MODERATE - interferes with normal activities (e.g., work, school)    - SEVERE - unable to stand, requires support to walk, feels like passing out now.      mild 5. ONSET:  "When did the dizziness begin?"     5 years ago doing exercises to for vertigo 6. AGGRAVATING FACTORS: "Does anything make it worse?" (e.g., standing, change in head position)     Change in position 7. HEART RATE: "Can you tell me your heart rate?" "How many beats in 15 seconds?"  (Note: not all patients can do this)       no 8. CAUSE: "What do you think is causing the dizziness?"     Unknown last time viral infection 9. RECURRENT SYMPTOM: "Have you had dizziness before?" If so, ask: "When was the last time?" "What happened that time?"     For past 5 years 10. OTHER SYMPTOMS: "Do you have any other symptoms?" (e.g., fever, chest pain, vomiting, diarrhea,  bleeding)       no 11. PREGNANCY: "Is there any chance you are pregnant?" "When was your last menstrual period?"       N/A  Protocols used: DIZZINESS [facility]

## 2017-12-27 ENCOUNTER — Encounter: Payer: Self-pay | Admitting: Family Medicine

## 2017-12-27 ENCOUNTER — Ambulatory Visit (INDEPENDENT_AMBULATORY_CARE_PROVIDER_SITE_OTHER): Payer: Medicare Other | Admitting: Family Medicine

## 2017-12-27 ENCOUNTER — Other Ambulatory Visit: Payer: Self-pay

## 2017-12-27 VITALS — BP 130/84 | HR 88 | Temp 98.3°F | Ht 66.0 in | Wt 202.8 lb

## 2017-12-27 DIAGNOSIS — R42 Dizziness and giddiness: Secondary | ICD-10-CM | POA: Diagnosis not present

## 2017-12-27 DIAGNOSIS — M1611 Unilateral primary osteoarthritis, right hip: Secondary | ICD-10-CM | POA: Diagnosis not present

## 2017-12-27 DIAGNOSIS — M1712 Unilateral primary osteoarthritis, left knee: Secondary | ICD-10-CM | POA: Diagnosis not present

## 2017-12-27 MED ORDER — MECLIZINE HCL 25 MG PO TABS: 25.0000 mg | ORAL_TABLET | Freq: Three times a day (TID) | ORAL | 1 refills | Status: DC | PRN

## 2017-12-27 NOTE — Patient Instructions (Signed)
Please follow up if symptoms do not improve or as needed.   

## 2017-12-27 NOTE — Progress Notes (Signed)
Subjective  CC:  Chief Complaint  Patient presents with  . Dizziness    has been doing excercises to try and help dizziness and is not working now     HPI: [name] is a 84 y.o. male who presents to the office today to address the problems listed above in the chief complaint.  84 year old with long history of chronic intermittent vertigo, peripheral complains of 1 to 2 weeks 1 to 2-week history of same.  Started after viral infection.  Complains of dizziness with head movement.  Denies any other neurologic symptoms including diplopia, dysarthria, headache or paresis.  At times he is fine, other times feeling off balance.  No chest pain or palpitations.  He has been to neuro rehab multiple times for same.  He does home exercises that have not been helpful. Assessment  1. Chronic vertigo      Plan   Vertigo: Chronic recurring problem.  Counseling and education given.  Fall risk avoidance discussed.  Start meclizine.  Has not had great success with this in the past but it may be mildly helpful.  Continue home rehab exercises.  Follow-up if not improved over the next 2 to 3 weeks.  Follow up: No follow-ups on file.   No orders of the defined types were placed in this encounter.  Meds ordered this encounter  Medications  . meclizine (ANTIVERT) 25 MG tablet    Sig: Take 1 tablet (25 mg total) by mouth 3 (three) times daily as needed for dizziness.    Dispense:  30 tablet    Refill:  1      I reviewed the patients updated PMH, FH, and SocHx.    Patient Active Problem List   Diagnosis Date Noted  . Diabetic peripheral neuropathy associated with type 2 diabetes mellitus ([place]) 05/05/2017    Priority: High  . Atherosclerotic heart disease of native coronary artery without angina pectoris 06/13/2016    Priority: High  . CVA (cerebral vascular accident) ([place]) 06/06/2016    Priority: High  . Recurrent coronary arteriosclerosis after percutaneous transluminal coronary  angioplasty 04/18/2015    Priority: High  . CAD S/P PCI- Nov 2016     Priority: High  . Current use of long term anticoagulation 02/06/2013    Priority: High  . Chronic atrial fibrillation 07/30/2011    Priority: High  . Hx of CABG x 2 1990     Priority: High  . Essential (primary) hypertension     Priority: High  . Diabetes mellitus type 2, insulin dependent ([place])     Priority: High  . Hyperlipemia     Priority: High  . Type 2 diabetes mellitus ([place]) 01/15/2010    Priority: High  . Chronic vertigo 03/15/2014    Priority: Medium  . Obesity (BMI 30.0-34.9) 01/31/2012    Priority: Medium  . Enlarged prostate without lower urinary tract symptoms (luts) 01/04/2011    Priority: Medium  . Gastro-esophageal reflux disease without esophagitis 12/07/2010    Priority: Medium  . Epistaxis, recurrent 04/11/2015    Priority: Low  . Osteoarthritis, knee 01/31/2012    Priority: Low  . Allergic rhinitis 10/28/2011    Priority: Low  . Hearing loss 06/08/2011    Priority: Low  . Primary osteoarthritis of hand 06/08/2011    Priority: Low  . Hematuria 08/27/2008    Priority: Low  . Leukocytosis 06/02/2016  . Carotid stenosis 06/02/2016   Current Meds  Medication Sig  . apixaban (ELIQUIS) 5 MG TABS  tablet Take 1 tablet (5 mg total) by mouth 2 (two) times daily.  [name] atorvastatin (LIPITOR) 40 MG tablet Take 1 tablet (40 mg total) by mouth daily at 6 PM.  . AVODART 0.5 MG capsule Take 0.5 mg by mouth every other day.   . diltiazem (CARDIZEM CD) 360 MG 24 hr capsule Take 1 capsule (360 mg total) by mouth daily.  Regino Schultze Bandages & Supports (KNEE BRACE ADJUSTABLE HINGED) MISC Wear daily as tolerated  . empagliflozin (JARDIANCE) 10 MG TABS tablet Take 10 mg by mouth daily.  [name] HYDROcodone-acetaminophen (NORCO) 5-325 MG tablet Take 1 tablet by mouth every 6 (six) hours as needed for severe pain.  [name] insulin glargine (LANTUS) 100 UNIT/ML injection Inject 40 Units into the skin at bedtime.   .  metFORMIN (GLUCOPHAGE) 500 MG tablet Take 500 mg by mouth 2 (two) times daily with a meal.  . metoprolol succinate (TOPROL-XL) 100 MG 24 hr tablet TAKE 1 TABLET BY MOUTH TWICE DAILY  . nitroGLYCERIN (NITROSTAT) 0.4 MG SL tablet Place 0.4 mg under the tongue every 5 (five) minutes as needed for chest pain (x 3 doses).  . pantoprazole (PROTONIX) 40 MG tablet TAKE 1 TABLET BY MOUTH DAILY AT NOON  . PRECISION XTRA TEST STRIPS test strip TEST BLOOD SUGAR 1-2 TIMES DAILY  . Tamsulosin HCl (FLOMAX) 0.4 MG CAPS Take 0.4 mg by mouth every other day.     Allergies: Patient is allergic to erythromycin base; amoxicillin; cefadroxil; ciprofloxacin; and erythromycin. Family History: Patient family history includes Brain cancer in his father; Throat cancer in his brother. Social History:  Patient  reports that he quit smoking about 59 years ago. His smoking use included cigarettes. He has a 30.00 pack-year smoking history. He has never used smokeless tobacco. He reports that he does not drink alcohol or use drugs.  Review of Systems: Constitutional: Negative for fever malaise or anorexia Cardiovascular: negative for chest pain Respiratory: negative for SOB or persistent cough Gastrointestinal: negative for abdominal pain  Objective  Vitals: BP 130/84   Pulse 88   Temp 98.3 F (36.8 C)   Ht 5\' 6"  (1.676 m)   Wt 202 lb 12.8 oz (92 kg)   SpO2 98%   BMI 32.73 kg/m  General: no acute distress , A&Ox3, and relating with a cane Nonfocal neuro  skin:  Warm, no rashes     Commons side effects, risks, benefits, and alternatives for medications and treatment plan prescribed today were discussed, and the patient expressed understanding of the given instructions. Patient is instructed to call or message via MyChart if he/she has any questions or concerns regarding our treatment plan. No barriers to understanding were identified. We discussed Red Flag symptoms and signs in detail. Patient expressed  understanding regarding what to do in case of urgent or emergency type symptoms.   Medication list was reconciled, printed and provided to the patient in AVS. Patient instructions and summary information was reviewed with the patient as documented in the AVS. This note was prepared with assistance of Dragon voice recognition software. Occasional wrong-word or sound-a-like substitutions may have occurred due to the inherent limitations of voice recognition software

## 2018-01-03 DIAGNOSIS — M7061 Trochanteric bursitis, right hip: Secondary | ICD-10-CM | POA: Diagnosis not present

## 2018-01-03 DIAGNOSIS — Z96641 Presence of right artificial hip joint: Secondary | ICD-10-CM | POA: Insufficient documentation

## 2018-01-10 DIAGNOSIS — H35351 Cystoid macular degeneration, right eye: Secondary | ICD-10-CM | POA: Diagnosis not present

## 2018-01-10 DIAGNOSIS — H348312 Tributary (branch) retinal vein occlusion, right eye, stable: Secondary | ICD-10-CM | POA: Diagnosis not present

## 2018-01-10 DIAGNOSIS — E119 Type 2 diabetes mellitus without complications: Secondary | ICD-10-CM | POA: Diagnosis not present

## 2018-01-10 DIAGNOSIS — H35041 Retinal micro-aneurysms, unspecified, right eye: Secondary | ICD-10-CM | POA: Diagnosis not present

## 2018-01-11 ENCOUNTER — Encounter: Payer: Self-pay | Admitting: Podiatry

## 2018-01-11 ENCOUNTER — Ambulatory Visit (INDEPENDENT_AMBULATORY_CARE_PROVIDER_SITE_OTHER): Payer: Medicare Other | Admitting: Podiatry

## 2018-01-11 DIAGNOSIS — M79674 Pain in right toe(s): Secondary | ICD-10-CM | POA: Diagnosis not present

## 2018-01-11 DIAGNOSIS — D689 Coagulation defect, unspecified: Secondary | ICD-10-CM

## 2018-01-11 DIAGNOSIS — E119 Type 2 diabetes mellitus without complications: Secondary | ICD-10-CM

## 2018-01-11 DIAGNOSIS — B351 Tinea unguium: Secondary | ICD-10-CM

## 2018-01-11 NOTE — Progress Notes (Signed)
Patient ID: [name], male   DOB: [birth year], 84 y.o.   MRN: [id number] Complaint:  Visit Type: Patient returns to my office for continued preventative foot care services. Complaint: Patient states" my nails have grown long and thick and become painful to walk and wear shoes" Patient has been diagnosed with DM with no foot  complications. He presents for preventative foot care services. No changes to ROS.  Patient is on eliquiss.  Podiatric Exam: Vascular: dorsalis pedis and posterior tibial pulses are palpable bilateral. Capillary return is immediate. Temperature gradient is WNL. Skin turgor WNL  Sensorium: Normal Semmes Weinstein monofilament test. Normal tactile sensation bilaterally. Nail Exam: Pt has thick disfigured discolored nails with subungual debris noted bilateral entire nail hallux through fifth toenails Ulcer Exam: There is no evidence of ulcer or pre-ulcerative changes or infection. Orthopedic Exam: Muscle tone and strength are WNL. No limitations in general ROM. No crepitus or effusions noted. Foot type and digits show no abnormalities. Bony prominences are unremarkable. Skin: No Porokeratosis. No infection or ulcers  Diagnosis:  Tinea unguium, Pain in right toe, pain in left toes  Treatment & Plan Procedures and Treatment: Consent by patient was obtained for treatment procedures. The patient understood the discussion of treatment and procedures well. All questions were answered thoroughly reviewed. Debridement of mycotic and hypertrophic toenails, 1 through 5 bilateral and clearing of subungual debris. No ulceration, no infection noted. ABN signed for 2019.[name] Return Visit-Office Procedure: Patient instructed to return to the office for a follow up visit 10 weeks  for continued evaluation and treatment.    Gardiner Barefoot DPM

## 2018-01-19 DIAGNOSIS — M545 Low back pain: Secondary | ICD-10-CM | POA: Diagnosis not present

## 2018-01-19 DIAGNOSIS — M7061 Trochanteric bursitis, right hip: Secondary | ICD-10-CM | POA: Diagnosis not present

## 2018-02-01 ENCOUNTER — Encounter: Payer: Self-pay | Admitting: Family Medicine

## 2018-02-01 DIAGNOSIS — M545 Low back pain: Secondary | ICD-10-CM | POA: Diagnosis not present

## 2018-02-09 LAB — HEPATIC FUNCTION PANEL
ALT: 16 (ref 10–40)
AST: 14 (ref 14–40)
Bilirubin, Direct: 0.2 (ref 0.01–0.4)
Bilirubin, Total: 0.8

## 2018-02-09 LAB — BASIC METABOLIC PANEL
Creatinine: 1.1 (ref 0.6–1.3)
Glucose: 86
Potassium: 4.8 (ref 3.4–5.3)
Sodium: 139 (ref 137–147)

## 2018-02-09 LAB — CBC AND DIFFERENTIAL
HCT: 37 — AB (ref 41–53)
Hemoglobin: 11.4 — AB (ref 13.5–17.5)
Platelets: 346 (ref 150–399)
WBC: 10.3

## 2018-02-09 LAB — HEMOGLOBIN A1C: Hemoglobin A1C: 9

## 2018-02-09 LAB — TSH: TSH: 1.85 (ref 0.41–5.90)

## 2018-02-14 ENCOUNTER — Telehealth: Payer: Self-pay | Admitting: Family Medicine

## 2018-02-14 NOTE — Telephone Encounter (Signed)
Placed form on Dr. Tamela Oddi Desk for approval.   Doloris Hall,  LPN

## 2018-02-14 NOTE — Telephone Encounter (Signed)
I have placed a Medical Clearance form in the bin upfront with a charge sheet.

## 2018-02-15 NOTE — Telephone Encounter (Signed)
Signed forms have been faxed to Manuela Schwartz at [employer].

## 2018-02-15 NOTE — Telephone Encounter (Signed)
Completed request.  Ok to stop eliquis but recommend continuation of jardiance.  getteing epidural steroid injection.  Placed on Bethany's desk to fax.

## 2018-02-21 DIAGNOSIS — M5136 Other intervertebral disc degeneration, lumbar region: Secondary | ICD-10-CM | POA: Insufficient documentation

## 2018-02-21 DIAGNOSIS — M48061 Spinal stenosis, lumbar region without neurogenic claudication: Secondary | ICD-10-CM | POA: Insufficient documentation

## 2018-03-03 DIAGNOSIS — Z96641 Presence of right artificial hip joint: Secondary | ICD-10-CM | POA: Diagnosis not present

## 2018-03-03 DIAGNOSIS — M545 Low back pain: Secondary | ICD-10-CM | POA: Diagnosis not present

## 2018-03-09 ENCOUNTER — Encounter: Payer: Self-pay | Admitting: *Deleted

## 2018-03-09 ENCOUNTER — Other Ambulatory Visit: Payer: Self-pay

## 2018-03-09 ENCOUNTER — Ambulatory Visit (INDEPENDENT_AMBULATORY_CARE_PROVIDER_SITE_OTHER): Payer: Medicare Other | Admitting: Family Medicine

## 2018-03-09 ENCOUNTER — Encounter: Payer: Self-pay | Admitting: Family Medicine

## 2018-03-09 VITALS — BP 134/80 | HR 91 | Temp 98.2°F | Resp 16 | Ht 66.0 in | Wt 205.6 lb

## 2018-03-09 DIAGNOSIS — I2510 Atherosclerotic heart disease of native coronary artery without angina pectoris: Secondary | ICD-10-CM | POA: Diagnosis not present

## 2018-03-09 DIAGNOSIS — E119 Type 2 diabetes mellitus without complications: Secondary | ICD-10-CM

## 2018-03-09 DIAGNOSIS — I482 Chronic atrial fibrillation, unspecified: Secondary | ICD-10-CM | POA: Diagnosis not present

## 2018-03-09 DIAGNOSIS — Z794 Long term (current) use of insulin: Secondary | ICD-10-CM

## 2018-03-09 DIAGNOSIS — E1142 Type 2 diabetes mellitus with diabetic polyneuropathy: Secondary | ICD-10-CM

## 2018-03-09 LAB — POCT GLYCOSYLATED HEMOGLOBIN (HGB A1C): Hemoglobin A1C: 8.8 % — AB (ref 4.0–5.6)

## 2018-03-09 NOTE — Patient Instructions (Signed)
Please return in 6 weeks to recheck your diabetes.   Start eating better again. Stop eating sweets.  Start checking your sugars every morning fasting and then 2 hours after lunch OR dinner OR at bedtime (so you will check twice a day).  Go back to taking your insulin 40 units but take it at bedtime.   If you have any questions or concerns, please don't hesitate to send me a message via MyChart or call the office at [phone]. Thank you for visiting with [place] today! It's our pleasure caring for you.

## 2018-03-09 NOTE — Progress Notes (Signed)
Subjective  CC:  Chief Complaint  Patient presents with  . Hypertension  . Diabetes  . Coronary Artery Disease    HPI: [name] is a 85 y.o. male who presents to the office today for follow up of diabetes and problems listed above in the chief complaint.   Diabetes follow up: His diabetic control is reported as Worse.  He has not been feeling well.  Complains of feeling fatigued, complains of polydipsia, blurred vision.  He was evaluated in December at the [place], his A1c at that time was 9.0.  They increase his Lantus from 40 to 45 units.  He has been taking this daily.  He reports an episode of symptomatic hypoglycemia, 72 in the AM.  Corrected with orange juice and chocolate. He denies exertional CP or SOB.  He denies palpitations.  He denies lower extremity edema.. He denies foot sores.  I reviewed labs from the [place].  Normal CBC.  Mild anemia, normocytic.  Normal TSH.  Renal and liver function are stable.  Please see scanned in report.  CAD, chronic A. fib on blood thinner: All stable.  No symptoms.  Assessment  1. Diabetes mellitus type 2, insulin dependent ([place])   2. Atherosclerosis of native coronary artery of native heart without angina pectoris   3. Chronic atrial fibrillation   4. Diabetic peripheral neuropathy associated with type 2 diabetes mellitus ([place])      Plan   Diabetes is currently marginally controlled.  Counseling done.  Recommend decreasing Lantus back to 40 mg nightly.  Start fasting and postprandial blood sugar checks.  Elevated A1c in part due to multiple steroid injections over the last 6 weeks due to back pain and bursitis.  Also due to noncompliance with diabetic diet.  He will work on diet.  We will have close follow-up and recheck in 6 weeks.  No other medication changes at this time.  Need to avoid lows.  Goal A1c is between 8 and 8.5.  Mild anemia: Start over-the-counter iron daily.  Fatigue related to uncontrolled diabetes and hyperglycemia.  Also  was sleep deprived during back pain and hip pain problems.  Sleep is now better.  Follow up: Return in about 6 weeks (around 04/20/2018) for recheck, follow up Diabetes.. Orders Placed This Encounter  Procedures  . POCT glycosylated hemoglobin (Hb A1C)   No orders of the defined types were placed in this encounter.     Immunization History  Administered Date(s) Administered  . Influenza Split 11/14/2007, 01/01/2009, 12/23/2009  . Influenza, High Dose Seasonal PF 11/02/2012, 12/12/2013, 11/15/2014, 11/24/2017  . Influenza, Seasonal, Injecte, Preservative Fre 11/18/2015  . Influenza,trivalent, recombinat, inj, PF 12/07/2010  . Influenza-Unspecified 10/28/2011, 11/30/2014, 02/15/2017  . Pneumococcal Conjugate-13 12/13/2013  . Pneumococcal-Unspecified 08/20/2008  . Td 05/29/2002  . Tdap 10/28/2011, 02/15/2017  . Zoster 08/20/2008  . Zoster Recombinat (Shingrix) 10/13/2016, 12/13/2016    Diabetes Related Lab Review: Lab Results  Component Value Date   HGBA1C 8.8 (A) 03/09/2018   HGBA1C 7.9 (A) 12/07/2017   HGBA1C 7.7 (A) 08/01/2017    Lab Results  Component Value Date   MICROALBUR 3.5 (H) 08/01/2017   Lab Results  Component Value Date   CREATININE 1.06 12/07/2017   BUN 19 12/07/2017   NA 142 12/07/2017   K 5.0 12/07/2017   CL 105 12/07/2017   CO2 31 12/07/2017   Lab Results  Component Value Date   CHOL 146 12/07/2017   CHOL 159 07/20/2017   CHOL 178 06/07/2016  Lab Results  Component Value Date   HDL 44.70 12/07/2017   HDL 42 07/20/2017   HDL 45 06/07/2016   Lab Results  Component Value Date   LDLCALC 85 12/07/2017   LDLCALC 89 07/20/2017   LDLCALC 61 01/11/2017   Lab Results  Component Value Date   TRIG 78.0 12/07/2017   TRIG 139 07/20/2017   TRIG 133 06/07/2016   Lab Results  Component Value Date   CHOLHDL 3 12/07/2017   CHOLHDL 4.0 06/07/2016   No results found for: LDLDIRECT The ASCVD Risk score Mikey Bussing DC Jr., et al., 2013) failed to calculate  for the following reasons:   The 2013 ASCVD risk score is only valid for ages 57 to 56   The patient has a prior MI or stroke diagnosis I have reviewed the PMH, Fam and Soc history. Patient Active Problem List   Diagnosis Date Noted  . Diabetic peripheral neuropathy associated with type 2 diabetes mellitus ([place]) 05/05/2017    Priority: High    No pain   . Atherosclerotic heart disease of native coronary artery without angina pectoris 06/13/2016    Priority: High    Overview:  Overview:  RCA DES placed Nov 2016, LIMA-LAD presumed patent based on Myoview but no visualized, SVG-Dx occluded  Last Assessment & Plan:  RCA DES placed Nov 2016, LIMA-LAD presumed patent based on Myoview but no visualized, SVG-Dx occluded   . CVA (cerebral vascular accident) ([place]) 06/06/2016    Priority: High  . Recurrent coronary arteriosclerosis after percutaneous transluminal coronary angioplasty 04/18/2015    Priority: High    Overview:  Overview:  RCA DES placed Nov 2016, LIMA-LAD presumed patent based on Myoview but no visualized, SVG-Dx occluded  Last Assessment & Plan:  RCA DES placed Nov 2016, LIMA-LAD presumed patent based on Myoview but no visualized, SVG-Dx occluded   . CAD S/P PCI- Nov 2016     Priority: High    RCA DES placed Nov 2016, LIMA-LAD presumed patent based on Myoview but no visualized, SVG-Dx occluded   . Current use of long term anticoagulation 02/06/2013    Priority: High    Overview:  Monitored by cardiology   . Chronic atrial fibrillation 07/30/2011    Priority: High    CHADs VASc=5 for age, HTN, vascular disease, and DM   . Hx of CABG x 2 1990     Priority: High    Status post CABG x2 in 1990 including an LIMA graft to the LAD, and a vein graft to the diagonal.    . Essential (primary) hypertension     Priority: High  . Diabetes mellitus type 2, insulin dependent ([place])     Priority: High  . Hyperlipemia     Priority: High  . Chronic vertigo 03/15/2014     Priority: Medium  . Obesity (BMI 30.0-34.9) 01/31/2012    Priority: Medium  . Enlarged prostate without lower urinary tract symptoms (luts) 01/04/2011    Priority: Medium    Overview:  Urology - avodart and tamuloscin   . Gastro-esophageal reflux disease without esophagitis 12/07/2010    Priority: Medium  . Epistaxis, recurrent 04/11/2015    Priority: Low  . Osteoarthritis, knee 01/31/2012    Priority: Low  . Allergic rhinitis 10/28/2011    Priority: Low  . Hearing loss 06/08/2011    Priority: Low  . Primary osteoarthritis of hand 06/08/2011    Priority: Low  . Hematuria 08/27/2008    Priority: Low    Overview:  Essential  Hematuria - urology - Grapey  w/u benign  10/1 IMO update Overview:  Overview:  Essential Hematuria - urology - [place]y  w/u benign   . Spinal stenosis of lumbar region 02/21/2018  . Degeneration of lumbar intervertebral disc 02/21/2018  . History of revision of total replacement of right hip joint 01/03/2018  . Leukocytosis 06/02/2016  . Carotid stenosis 06/02/2016    Social History: Patient  reports that he quit smoking about 59 years ago. His smoking use included cigarettes. He has a 30.00 pack-year smoking history. He has never used smokeless tobacco. He reports that he does not drink alcohol or use drugs.  Review of Systems: Ophthalmic: negative for eye pain, loss of vision or double vision Cardiovascular: negative for chest pain Respiratory: negative for SOB or persistent cough Gastrointestinal: negative for abdominal pain Genitourinary: negative for dysuria or gross hematuria MSK: negative for foot lesions Neurologic: negative for weakness or gait disturbance  Objective  Vitals: BP 134/80   Pulse 91   Temp 98.2 F (36.8 C) (Oral)   Resp 16   Ht 5\' 6"  (1.676 m)   Wt 205 lb 9.6 oz (93.3 kg)   SpO2 97%   BMI 33.18 kg/m  General: well appearing, no acute distress  Psych:  Alert and oriented, normal mood and affect HEENT:   Normocephalic, atraumatic, moist mucous membranes, supple neck  Cardiovascular: Irregularly irregular  respiratory:  Good breath sounds bilaterally, CTAB with normal effort, no rales Gastrointestinal: normal BS, soft, nontender Skin:  Warm, no rashes Neurologic:   Mental status is normal. normal gait     Diabetic education: ongoing education regarding chronic disease management for diabetes was given today. We continue to reinforce the ABC's of diabetic management: A1c (<7 or 8 dependent upon patient), tight blood pressure control, and cholesterol management with goal LDL < 100 minimally. We discuss diet strategies, exercise recommendations, medication options and possible side effects. At each visit, we review recommended immunizations and preventive care recommendations for diabetics and stress that good diabetic control can prevent other problems. See below for this patient's data.    Commons side effects, risks, benefits, and alternatives for medications and treatment plan prescribed today were discussed, and the patient expressed understanding of the given instructions. Patient is instructed to call or message via MyChart if he/she has any questions or concerns regarding our treatment plan. No barriers to understanding were identified. We discussed Red Flag symptoms and signs in detail. Patient expressed understanding regarding what to do in case of urgent or emergency type symptoms.   Medication list was reconciled, printed and provided to the patient in AVS. Patient instructions and summary information was reviewed with the patient as documented in the AVS. This note was prepared with assistance of Dragon voice recognition software. Occasional wrong-word or sound-a-like substitutions may have occurred due to the inherent limitations of voice recognition software

## 2018-03-13 DIAGNOSIS — M5136 Other intervertebral disc degeneration, lumbar region: Secondary | ICD-10-CM | POA: Diagnosis not present

## 2018-03-23 ENCOUNTER — Ambulatory Visit: Payer: Self-pay | Admitting: *Deleted

## 2018-03-23 NOTE — Telephone Encounter (Signed)
Patient is calling with concerns about his glucose readings- he woke this morning with reading of 47 and had to treat himself. Patient changed when he take his insulin and he has been having irregular reading since the change.  Patient reports reading the last 4 days: am/pm 71/221,79/171,76/252,65/285, 47 at 5:30 this morning with symptoms  Reason for Disposition . [1] Blood glucose < 70  mg/dL (3.9 mmol/L) or symptomatic, now improved with Care Advice AND [2] cause unknown    Patient is requesting appointment with provider- appointment scheduled  Answer Assessment - Initial Assessment Questions 1. SYMPTOMS: "What symptoms are you concerned about?"     Low glucose level 2. ONSET:  "When did the symptoms start?"     Sweating and dizziness 3. BLOOD GLUCOSE: "What is your blood glucose level?"      47- early this morning 5:30 4. USUAL RANGE: "What is your blood glucose level usually?" (e.g., usual fasting morning value, usual evening value)     Patient states he has had glucose readings that have been all over the place for month 5. TYPE 1 or 2:  "Do you know what type of diabetes you have?"  (e.g., Type 1, Type 2, Gestational; doesn't know)      Type 2 6. INSULIN: "Do you take insulin?" "What type of insulin(s) do you use? What is the mode of delivery? (syringe, pen (e.g., injection or  pump)      Insulin- lantis- pen  7. DIABETES PILLS: "Do you take any pills for your diabetes?"     Pills- Jardiance, elliquis, metformin 8. OTHER SYMPTOMS: "Do you have any symptoms?" (e.g., fever, frequent urination, difficulty breathing, vomiting)     Sweating, light headed 9. LOW BLOOD GLUCOSE TREATMENT: "What have you done so far to treat the low blood glucose level?"     Patient ate and drank 10. FOOD: "When did you last eat or drink?"       Patient is calling at 9:13 today 11. ALONE: "Are you alone right now or is someone with you?"        Patient is married 77. PREGNANCY: "Is there any chance you  are pregnant?" "When was your last menstrual period?"       n/a  Protocols used: DIABETES - LOW BLOOD SUGAR-A-AH

## 2018-03-24 ENCOUNTER — Other Ambulatory Visit: Payer: Self-pay

## 2018-03-24 ENCOUNTER — Ambulatory Visit (INDEPENDENT_AMBULATORY_CARE_PROVIDER_SITE_OTHER): Payer: Medicare Other | Admitting: Family Medicine

## 2018-03-24 ENCOUNTER — Encounter: Payer: Self-pay | Admitting: Family Medicine

## 2018-03-24 VITALS — BP 130/73 | HR 93 | Temp 97.9°F | Resp 16 | Ht 66.0 in | Wt 205.4 lb

## 2018-03-24 DIAGNOSIS — E119 Type 2 diabetes mellitus without complications: Secondary | ICD-10-CM

## 2018-03-24 DIAGNOSIS — E162 Hypoglycemia, unspecified: Secondary | ICD-10-CM

## 2018-03-24 DIAGNOSIS — I10 Essential (primary) hypertension: Secondary | ICD-10-CM | POA: Diagnosis not present

## 2018-03-24 DIAGNOSIS — Z794 Long term (current) use of insulin: Secondary | ICD-10-CM

## 2018-03-24 DIAGNOSIS — E11649 Type 2 diabetes mellitus with hypoglycemia without coma: Secondary | ICD-10-CM | POA: Diagnosis not present

## 2018-03-24 LAB — CBC WITH DIFFERENTIAL/PLATELET
Basophils Absolute: 0.2 10*3/uL — ABNORMAL HIGH (ref 0.0–0.1)
Basophils Relative: 2.1 % (ref 0.0–3.0)
Eosinophils Absolute: 0.3 10*3/uL (ref 0.0–0.7)
Eosinophils Relative: 2.9 % (ref 0.0–5.0)
HCT: 38.2 % — ABNORMAL LOW (ref 39.0–52.0)
Hemoglobin: 12.3 g/dL — ABNORMAL LOW (ref 13.0–17.0)
Lymphocytes Relative: 14.2 % (ref 12.0–46.0)
Lymphs Abs: 1.5 10*3/uL (ref 0.7–4.0)
MCHC: 32.1 g/dL (ref 30.0–36.0)
MCV: 80.7 fl (ref 78.0–100.0)
Monocytes Absolute: 0.9 10*3/uL (ref 0.1–1.0)
Monocytes Relative: 8.6 % (ref 3.0–12.0)
Neutro Abs: 7.5 10*3/uL (ref 1.4–7.7)
Neutrophils Relative %: 72.2 % (ref 43.0–77.0)
Platelets: 262 10*3/uL (ref 150.0–400.0)
RBC: 4.73 Mil/uL (ref 4.22–5.81)
RDW: 19.6 % — ABNORMAL HIGH (ref 11.5–15.5)
WBC: 10.4 10*3/uL (ref 4.0–10.5)

## 2018-03-24 LAB — POCT URINALYSIS DIPSTICK
Bilirubin, UA: NEGATIVE
Blood, UA: NEGATIVE
Glucose, UA: POSITIVE — AB
Ketones, UA: NEGATIVE
Leukocytes, UA: NEGATIVE
Nitrite, UA: NEGATIVE
Protein, UA: NEGATIVE
Spec Grav, UA: 1.02 (ref 1.010–1.025)
Urobilinogen, UA: 0.2 E.U./dL
pH, UA: 5 (ref 5.0–8.0)

## 2018-03-24 LAB — COMPREHENSIVE METABOLIC PANEL
ALT: 10 U/L (ref 0–53)
AST: 12 U/L (ref 0–37)
Albumin: 4.1 g/dL (ref 3.5–5.2)
Alkaline Phosphatase: 68 U/L (ref 39–117)
BUN: 16 mg/dL (ref 6–23)
CO2: 30 mEq/L (ref 19–32)
Calcium: 9.3 mg/dL (ref 8.4–10.5)
Chloride: 106 mEq/L (ref 96–112)
Creatinine, Ser: 1.05 mg/dL (ref 0.40–1.50)
GFR: 66.68 mL/min (ref >60.00)
Glucose, Bld: 209 mg/dL — ABNORMAL HIGH (ref 70–99)
Potassium: 5.3 mEq/L — ABNORMAL HIGH (ref 3.5–5.1)
Sodium: 142 mEq/L (ref 135–145)
Total Bilirubin: 0.6 mg/dL (ref 0.2–1.2)
Total Protein: 6.5 g/dL (ref 6.0–8.3)

## 2018-03-24 LAB — POCT GLYCOSYLATED HEMOGLOBIN (HGB A1C): Hemoglobin A1C: 8 % — AB (ref 4.0–5.6)

## 2018-03-24 LAB — TSH: TSH: 1.92 u[IU]/mL (ref 0.35–4.50)

## 2018-03-24 NOTE — Progress Notes (Signed)
Subjective  CC:  Chief Complaint  Patient presents with  . Blood sugars concerns    He reports that he has been getting abnormal readings     HPI: [name] is a 85 y.o. male who presents to the office today for follow up of diabetes and problems listed above in the chief complaint.   Diabetes follow up: 85 year old with marginally controlled type 2 diabetes here a few weeks ago for diabetes follow-up.  At that time his A1c was 9.0, in part due to multiple steroid injections over the prior months.  We adjusted his Lantus to nighttime dosing and decreased it to 40 from 45, this was because he was having rebound hyperglycemia after lows.  He also has been eating a better diet.  He is here today because in the mornings he is having hypoglycemic episodes that are symptomatic.  Lowest blood sugar was yesterday at 47.  Responds to orange juice.  His diet is regular, eats breakfast which is a wheat cereal or bagel around 9 AM, will have a lunch around noon, eats dinner between 5 and 6 and then a nighttime snack.  He has been eating much less sweets.  He denies symptoms of infection including URI symptoms, fevers, abdominal pain, dysuria, or rash.  His energy is low but mainly related to hypoglycemic events.  He denies chest pain, palpitations or symptoms of CHF.  I reviewed lab work from December, creatinine was 1.1 with a GFR greater than 60.  Normal LFTs.  Normal hemoglobin and white blood count.  Wt Readings from Last 3 Encounters:  03/24/18 205 lb 6.4 oz (93.2 kg)  03/09/18 205 lb 9.6 oz (93.3 kg)  12/27/17 202 lb 12.8 oz (92 kg)    BP Readings from Last 3 Encounters:  03/24/18 130/73  03/09/18 134/80  12/27/17 130/84    Assessment  1. Diabetes mellitus type 2, insulin dependent ([place])   2. Hypoglycemia   3. Essential (primary) hypertension      Plan   Diabetes is currently adequately controlled.  A.m. hyperglycemia.  Will change Lantus back to a.m. dosing, continue at 40 units  daily.  We may need to lower the dose of Lantus or split to twice daily dosing if he remains with symptomatic hypoglycemia.  Patient is aware.  Monitor twice daily sugars as he is.  Postprandials remain high.  Hard to assess given morning lows.  Continue current oral medications.  Recheck in 3 to 5 days by phone call.  Then follow-up here in the office in 2 weeks.  Rule out renal dysfunction or infection with lab work and urine today.  Extensive education given on how to deal with hypoglycemia.  Continue regular diet.  Blood pressures controlled as his heart disease.  No sign of heart failure today.  Follow up: Return in about 2 weeks (around 04/07/2018) for recheck.. Orders Placed This Encounter  Procedures  . CBC with Differential/Platelet  . Comprehensive metabolic panel  . TSH  . POCT urinalysis dipstick  . POCT glycosylated hemoglobin (Hb A1C)   No orders of the defined types were placed in this encounter.     Immunization History  Administered Date(s) Administered  . Influenza Split 11/14/2007, 01/01/2009, 12/23/2009  . Influenza, High Dose Seasonal PF 11/02/2012, 12/12/2013, 11/15/2014, 11/24/2017  . Influenza, Seasonal, Injecte, Preservative Fre 11/18/2015  . Influenza,trivalent, recombinat, inj, PF 12/07/2010  . Influenza-Unspecified 10/28/2011, 11/30/2014, 02/15/2017  . Pneumococcal Conjugate-13 12/13/2013  . Pneumococcal-Unspecified 08/20/2008  . Td 05/29/2002  .  Tdap 10/28/2011, 02/15/2017  . Zoster 08/20/2008  . Zoster Recombinat (Shingrix) 10/13/2016, 12/13/2016    Diabetes Related Lab Review: Lab Results  Component Value Date   HGBA1C 8.0 (A) 03/24/2018   HGBA1C 8.8 (A) 03/09/2018   HGBA1C 9.0 02/09/2018    Lab Results  Component Value Date   MICROALBUR 3.5 (H) 08/01/2017   Lab Results  Component Value Date   CREATININE 1.1 02/09/2018   BUN 19 12/07/2017   NA 139 02/09/2018   K 4.8 02/09/2018   CL 105 12/07/2017   CO2 31 12/07/2017   Lab Results    Component Value Date   CHOL 146 12/07/2017   CHOL 159 07/20/2017   CHOL 178 06/07/2016   Lab Results  Component Value Date   HDL 44.70 12/07/2017   HDL 42 07/20/2017   HDL 45 06/07/2016   Lab Results  Component Value Date   LDLCALC 85 12/07/2017   LDLCALC 89 07/20/2017   LDLCALC 61 01/11/2017   Lab Results  Component Value Date   TRIG 78.0 12/07/2017   TRIG 139 07/20/2017   TRIG 133 06/07/2016   Lab Results  Component Value Date   CHOLHDL 3 12/07/2017   CHOLHDL 4.0 06/07/2016   No results found for: LDLDIRECT The ASCVD Risk score Mikey Bussing DC Jr., et al., 2013) failed to calculate for the following reasons:   The 2013 ASCVD risk score is only valid for ages 12 to 42   The patient has a prior MI or stroke diagnosis I have reviewed the PMH, Fam and Soc history. Patient Active Problem List   Diagnosis Date Noted  . Diabetic peripheral neuropathy associated with type 2 diabetes mellitus ([place]) 05/05/2017    Priority: High    No pain   . Atherosclerotic heart disease of native coronary artery without angina pectoris 06/13/2016    Priority: High    Overview:  Overview:  RCA DES placed Nov 2016, LIMA-LAD presumed patent based on Myoview but no visualized, SVG-Dx occluded  Last Assessment & Plan:  RCA DES placed Nov 2016, LIMA-LAD presumed patent based on Myoview but no visualized, SVG-Dx occluded   . CVA (cerebral vascular accident) ([place]) 06/06/2016    Priority: High  . Recurrent coronary arteriosclerosis after percutaneous transluminal coronary angioplasty 04/18/2015    Priority: High    Overview:  Overview:  RCA DES placed Nov 2016, LIMA-LAD presumed patent based on Myoview but no visualized, SVG-Dx occluded  Last Assessment & Plan:  RCA DES placed Nov 2016, LIMA-LAD presumed patent based on Myoview but no visualized, SVG-Dx occluded   . CAD S/P PCI- Nov 2016     Priority: High    RCA DES placed Nov 2016, LIMA-LAD presumed patent based on Myoview but no  visualized, SVG-Dx occluded   . Current use of long term anticoagulation 02/06/2013    Priority: High    Overview:  Monitored by cardiology   . Chronic atrial fibrillation 07/30/2011    Priority: High    CHADs VASc=5 for age, HTN, vascular disease, and DM   . Hx of CABG x 2 1990     Priority: High    Status post CABG x2 in 1990 including an LIMA graft to the LAD, and a vein graft to the diagonal.    . Essential (primary) hypertension     Priority: High  . Diabetes mellitus type 2, insulin dependent ([place])     Priority: High  . Hyperlipemia     Priority: High  . Chronic vertigo 03/15/2014  Priority: Medium  . Obesity (BMI 30.0-34.9) 01/31/2012    Priority: Medium  . Enlarged prostate without lower urinary tract symptoms (luts) 01/04/2011    Priority: Medium    Overview:  Urology - avodart and tamuloscin   . Gastro-esophageal reflux disease without esophagitis 12/07/2010    Priority: Medium  . Epistaxis, recurrent 04/11/2015    Priority: Low  . Osteoarthritis, knee 01/31/2012    Priority: Low  . Allergic rhinitis 10/28/2011    Priority: Low  . Hearing loss 06/08/2011    Priority: Low  . Primary osteoarthritis of hand 06/08/2011    Priority: Low  . Hematuria 08/27/2008    Priority: Low    Overview:  Essential Hematuria - urology - Grapey  w/u benign  10/1 IMO update Overview:  Overview:  Essential Hematuria - urology - [place]rape[place]  w/u benign   . Spinal stenosis of lumbar region 02/21/2018  . Degeneration of lumbar intervertebral disc 02/21/2018  . History of revision of total replacement of right hip joint 01/03/2018  . Leukocytosis 06/02/2016  . Carotid stenosis 06/02/2016    Social History: Patient  reports that he quit smoking about 59 years ago. His smoking use included cigarettes. He has a 30.00 pack-year smoking history. He has never used smokeless tobacco. He reports that he does not drink alcohol or use drugs.  Review of Systems: Ophthalmic:  negative for eye pain, loss of vision or double vision Cardiovascular: negative for chest pain Respiratory: negative for SOB or persistent cough Gastrointestinal: negative for abdominal pain Genitourinary: negative for dysuria or gross hematuria MSK: negative for foot lesions Neurologic: negative for weakness or gait disturbance  Objective  Vitals: BP 130/73   Pulse 93   Temp 97.9 F (36.6 C) (Oral)   Resp 16   Ht 5\' 6"  (1.676 m)   Wt 205 lb 6.4 oz (93.2 kg)   SpO2 98%   BMI 33.15 kg/m  General: well appearing, no acute distress, looks well Psych:  Alert and oriented, normal mood and affect HEENT:  Normocephalic, atraumatic, moist mucous membranes, supple neck  Cardiovascular: Distant heart sounds, irregularly irregular, no lower extremity edema respiratory:  Good breath sounds bilaterally, CTAB with normal effort, no rales Gastrointestinal: normal BS, soft, nontender Skin:  Warm, no rashes Neurologic:   Mental status is normal. normal gait, no tremor    Diabetic education: ongoing education regarding chronic disease management for diabetes was given today. We continue to reinforce the ABC's of diabetic management: A1c (<7 or 8 dependent upon patient), tight blood pressure control, and cholesterol management with goal LDL < 100 minimally. We discuss diet strategies, exercise recommendations, medication options and possible side effects. At each visit, we review recommended immunizations and preventive care recommendations for diabetics and stress that good diabetic control can prevent other problems. See below for this patient's data.    Commons side effects, risks, benefits, and alternatives for medications and treatment plan prescribed today were discussed, and the patient expressed understanding of the given instructions. Patient is instructed to call or message via MyChart if he/she has any questions or concerns regarding our treatment plan. No barriers to understanding were  identified. We discussed Red Flag symptoms and signs in detail. Patient expressed understanding regarding what to do in case of urgent or emergency type symptoms.   Medication list was reconciled, printed and provided to the patient in AVS. Patient instructions and summary information was reviewed with the patient as documented in the AVS. This note was prepared with assistance of  Dragon Armed forces training and education officer. Occasional wrong-word or sound-a-like substitutions may have occurred due to the inherent limitations of voice recognition software

## 2018-03-24 NOTE — Patient Instructions (Signed)
Please return in 2-3 weeks to recheck sugars.   Take your lantus 40 units in the am again.  Keep the same other medications.  Do not skip meals.    If you have any questions or concerns, please don't hesitate to send me a message via MyChart or call the office at [phone]. Thank you for visiting with [place] today! It's our pleasure caring for you.

## 2018-03-24 NOTE — Progress Notes (Signed)
Please call patient: I have reviewed his/her lab results. Labs are stable. Sugar was high due to the low this morning. a1c was better at 8.0.  Change lantus to am, may need to decrease dose if still with intermittent lows. Kidney function is normal.  Will recheck potassium at next visit - it was just above normal and this could be due to his medications.

## 2018-04-12 ENCOUNTER — Encounter: Payer: Self-pay | Admitting: Podiatry

## 2018-04-12 ENCOUNTER — Ambulatory Visit (INDEPENDENT_AMBULATORY_CARE_PROVIDER_SITE_OTHER): Payer: Medicare Other | Admitting: Podiatry

## 2018-04-12 DIAGNOSIS — D689 Coagulation defect, unspecified: Secondary | ICD-10-CM

## 2018-04-12 DIAGNOSIS — M79674 Pain in right toe(s): Secondary | ICD-10-CM

## 2018-04-12 DIAGNOSIS — B351 Tinea unguium: Secondary | ICD-10-CM | POA: Diagnosis not present

## 2018-04-12 DIAGNOSIS — E119 Type 2 diabetes mellitus without complications: Secondary | ICD-10-CM | POA: Diagnosis not present

## 2018-04-12 NOTE — Progress Notes (Signed)
Patient ID: [name], male   DOB: [birth year], 85 y.o.   MRN: [id number] Complaint:  Visit Type: Patient returns to my office for continued preventative foot care services. Complaint: Patient states" my nails have grown long and thick and become painful to walk and wear shoes" Patient has been diagnosed with DM with no foot  complications. He presents for preventative foot care services. No changes to ROS.  Patient is on eliquiss.  Podiatric Exam: Vascular: dorsalis pedis and posterior tibial pulses are palpable bilateral. Capillary return is immediate. Temperature gradient is WNL. Skin turgor WNL  Sensorium: Normal Semmes Weinstein monofilament test. Normal tactile sensation bilaterally. Nail Exam: Pt has thick disfigured discolored nails with subungual debris noted bilateral entire nail hallux through fifth toenails Ulcer Exam: There is no evidence of ulcer or pre-ulcerative changes or infection. Orthopedic Exam: Muscle tone and strength are WNL. No limitations in general ROM. No crepitus or effusions noted. Foot type and digits show no abnormalities. Bony prominences are unremarkable. Skin: No Porokeratosis. No infection or ulcers  Diagnosis:  Tinea unguium, Pain in right toe, pain in left toes  Treatment & Plan Procedures and Treatment: Consent by patient was obtained for treatment procedures. The patient understood the discussion of treatment and procedures well. All questions were answered thoroughly reviewed. Debridement of mycotic and hypertrophic toenails, 1 through 5 bilateral and clearing of subungual debris. No ulceration, no infection noted.  Return Visit-Office Procedure: Patient instructed to return to the office for a follow up visit 10 weeks  for continued evaluation and treatment.    Gardiner Barefoot DPM

## 2018-04-24 ENCOUNTER — Other Ambulatory Visit: Payer: Self-pay

## 2018-04-24 ENCOUNTER — Ambulatory Visit (INDEPENDENT_AMBULATORY_CARE_PROVIDER_SITE_OTHER): Payer: Medicare Other | Admitting: Family Medicine

## 2018-04-24 ENCOUNTER — Encounter: Payer: Self-pay | Admitting: Family Medicine

## 2018-04-24 VITALS — BP 138/42 | HR 100 | Temp 98.0°F | Resp 16 | Ht 66.0 in | Wt 205.2 lb

## 2018-04-24 DIAGNOSIS — Z794 Long term (current) use of insulin: Secondary | ICD-10-CM

## 2018-04-24 DIAGNOSIS — E1142 Type 2 diabetes mellitus with diabetic polyneuropathy: Secondary | ICD-10-CM

## 2018-04-24 DIAGNOSIS — E875 Hyperkalemia: Secondary | ICD-10-CM | POA: Diagnosis not present

## 2018-04-24 DIAGNOSIS — E119 Type 2 diabetes mellitus without complications: Secondary | ICD-10-CM | POA: Diagnosis not present

## 2018-04-24 LAB — BASIC METABOLIC PANEL
BUN: 16 mg/dL (ref 6–23)
CO2: 30 mEq/L (ref 19–32)
Calcium: 9.4 mg/dL (ref 8.4–10.5)
Chloride: 106 mEq/L (ref 96–112)
Creatinine, Ser: 1.05 mg/dL (ref 0.40–1.50)
GFR: 66.67 mL/min (ref >60.00)
Glucose, Bld: 122 mg/dL — ABNORMAL HIGH (ref 70–99)
Potassium: 4.3 mEq/L (ref 3.5–5.1)
Sodium: 144 mEq/L (ref 135–145)

## 2018-04-24 NOTE — Progress Notes (Signed)
Please call patient: I have reviewed his/her lab results. Labs look much better now.

## 2018-04-24 NOTE — Progress Notes (Signed)
Subjective  CC:  Chief Complaint  Patient presents with  . Follow-up  . Elevated blood sugar readings    His morning blood sugars are low 100s and evening reading are mid 100s-low 200s    HPI: [name] is a 85 y.o. male who presents to the office today for follow up of diabetes and problems listed above in the chief complaint.   Diabetes follow up: here for f/u: decreased lantus and changed to am dosing to avoid am lows; doing much better. Bid sugar log is reviewed and is improved. fastings are at goals. After dinner readings are variable with a few highs: 250s.   F/u mild hyperkalemia on recent labs.   Feeling well Wt Readings from Last 3 Encounters:  04/24/18 205 lb 3.2 oz (93.1 kg)  03/24/18 205 lb 6.4 oz (93.2 kg)  03/09/18 205 lb 9.6 oz (93.3 kg)    BP Readings from Last 3 Encounters:  04/24/18 (!) 138/42  03/24/18 130/73  03/09/18 134/80    Assessment  1. Diabetes mellitus type 2, insulin dependent ([place])   2. Diabetic peripheral neuropathy associated with type 2 diabetes mellitus ([place])   3. Hyperkalemia      Plan   Diabetes is currently well controlled. Sugars are improved. Can tolerate elevated postprandials. No med changes today.   Recheck bmp.    Follow up: Return in about 6 weeks (around 06/05/2018) for follow up of diabetes and hypertension.. Orders Placed This Encounter  Procedures  . Basic metabolic panel   No orders of the defined types were placed in this encounter.     Immunization History  Administered Date(s) Administered  . Influenza Split 11/14/2007, 01/01/2009, 12/23/2009  . Influenza, High Dose Seasonal PF 11/02/2012, 12/12/2013, 11/15/2014, 11/24/2017  . Influenza, Seasonal, Injecte, Preservative Fre 11/18/2015  . Influenza,trivalent, recombinat, inj, PF 12/07/2010  . Influenza-Unspecified 10/28/2011, 11/30/2014, 02/15/2017  . Pneumococcal Conjugate-13 12/13/2013  . Pneumococcal-Unspecified 08/20/2008  . Td 05/29/2002  . Tdap  10/28/2011, 02/15/2017  . Zoster 08/20/2008  . Zoster Recombinat (Shingrix) 10/13/2016, 12/13/2016    Diabetes Related Lab Review: Lab Results  Component Value Date   HGBA1C 8.0 (A) 03/24/2018   HGBA1C 8.8 (A) 03/09/2018   HGBA1C 9.0 02/09/2018    Lab Results  Component Value Date   MICROALBUR 3.5 (H) 08/01/2017   Lab Results  Component Value Date   CREATININE 1.05 03/24/2018   BUN 16 03/24/2018   NA 142 03/24/2018   K 5.3 (H) 03/24/2018   CL 106 03/24/2018   CO2 30 03/24/2018   Lab Results  Component Value Date   CHOL 146 12/07/2017   CHOL 159 07/20/2017   CHOL 178 06/07/2016   Lab Results  Component Value Date   HDL 44.70 12/07/2017   HDL 42 07/20/2017   HDL 45 06/07/2016   Lab Results  Component Value Date   LDLCALC 85 12/07/2017   LDLCALC 89 07/20/2017   LDLCALC 61 01/11/2017   Lab Results  Component Value Date   TRIG 78.0 12/07/2017   TRIG 139 07/20/2017   TRIG 133 06/07/2016   Lab Results  Component Value Date   CHOLHDL 3 12/07/2017   CHOLHDL 4.0 06/07/2016   No results found for: LDLDIRECT The ASCVD Risk score Mikey Bussing DC Jr., et al., 2013) failed to calculate for the following reasons:   The 2013 ASCVD risk score is only valid for ages 50 to 69   The patient has a prior MI or stroke diagnosis I have reviewed the  PMH, Fam and Soc history. Patient Active Problem List   Diagnosis Date Noted  . Diabetic peripheral neuropathy associated with type 2 diabetes mellitus ([place]) 05/05/2017    Priority: High    No pain   . Atherosclerotic heart disease of native coronary artery without angina pectoris 06/13/2016    Priority: High    Overview:  Overview:  RCA DES placed Nov 2016, LIMA-LAD presumed patent based on Myoview but no visualized, SVG-Dx occluded  Last Assessment & Plan:  RCA DES placed Nov 2016, LIMA-LAD presumed patent based on Myoview but no visualized, SVG-Dx occluded   . CVA (cerebral vascular accident) ([place]) 06/06/2016    Priority: High   . Recurrent coronary arteriosclerosis after percutaneous transluminal coronary angioplasty 04/18/2015    Priority: High    Overview:  Overview:  RCA DES placed Nov 2016, LIMA-LAD presumed patent based on Myoview but no visualized, SVG-Dx occluded  Last Assessment & Plan:  RCA DES placed Nov 2016, LIMA-LAD presumed patent based on Myoview but no visualized, SVG-Dx occluded   . CAD S/P PCI- Nov 2016     Priority: High    RCA DES placed Nov 2016, LIMA-LAD presumed patent based on Myoview but no visualized, SVG-Dx occluded   . Current use of long term anticoagulation 02/06/2013    Priority: High    Overview:  Monitored by cardiology   . Chronic atrial fibrillation 07/30/2011    Priority: High    CHADs VASc=5 for age, HTN, vascular disease, and DM   . Hx of CABG x 2 1990     Priority: High    Status post CABG x2 in 1990 including an LIMA graft to the LAD, and a vein graft to the diagonal.    . Essential (primary) hypertension     Priority: High  . Diabetes mellitus type 2, insulin dependent ([place])     Priority: High  . Hyperlipemia     Priority: High  . Chronic vertigo 03/15/2014    Priority: Medium  . Obesity (BMI 30.0-34.9) 01/31/2012    Priority: Medium  . Enlarged prostate without lower urinary tract symptoms (luts) 01/04/2011    Priority: Medium    Overview:  Urology - avodart and tamuloscin   . Gastro-esophageal reflux disease without esophagitis 12/07/2010    Priority: Medium  . Epistaxis, recurrent 04/11/2015    Priority: Low  . Osteoarthritis, knee 01/31/2012    Priority: Low  . Allergic rhinitis 10/28/2011    Priority: Low  . Hearing loss 06/08/2011    Priority: Low  . Primary osteoarthritis of hand 06/08/2011    Priority: Low  . Hematuria 08/27/2008    Priority: Low    Overview:  Essential Hematuria - urology - Grapey  w/u benign  10/1 IMO update Overview:  Overview:  Essential Hematuria - urology - [place]rape[place]  w/u benign   . Spinal stenosis of  lumbar region 02/21/2018  . Degeneration of lumbar intervertebral disc 02/21/2018  . History of revision of total replacement of right hip joint 01/03/2018  . Leukocytosis 06/02/2016  . Carotid stenosis 06/02/2016    Social History: Patient  reports that he quit smoking about 59 years ago. His smoking use included cigarettes. He has a 30.00 pack-year smoking history. He has never used smokeless tobacco. He reports that he does not drink alcohol or use drugs.  Review of Systems: Ophthalmic: negative for eye pain, loss of vision or double vision Cardiovascular: negative for chest pain Respiratory: negative for SOB or persistent cough Gastrointestinal: negative for  abdominal pain Genitourinary: negative for dysuria or gross hematuria MSK: negative for foot lesions Neurologic: negative for weakness or gait disturbance  Objective  Vitals: BP (!) 138/42   Pulse 100   Temp 98 F (36.7 C) (Oral)   Resp 16   Ht 5\' 6"  (1.676 m)   Wt 205 lb 3.2 oz (93.1 kg)   SpO2 97%   BMI 33.12 kg/m  General: well appearing, no acute distress  Psych:  Alert and oriented, normal mood and affect HEENT:  Normocephalic, atraumatic, moist mucous membranes, supple neck  Cardiovascular:  Nl S1 and S2, RRR without murmur, gallop or rub. no edema Respiratory:  Good breath sounds bilaterally, CTAB with normal effort, no rales   Diabetic education: ongoing education regarding chronic disease management for diabetes was given today. We continue to reinforce the ABC's of diabetic management: A1c (<7 or 8 dependent upon patient), tight blood pressure control, and cholesterol management with goal LDL < 100 minimally. We discuss diet strategies, exercise recommendations, medication options and possible side effects. At each visit, we review recommended immunizations and preventive care recommendations for diabetics and stress that good diabetic control can prevent other problems. See below for this patient's  data.    Commons side effects, risks, benefits, and alternatives for medications and treatment plan prescribed today were discussed, and the patient expressed understanding of the given instructions. Patient is instructed to call or message via MyChart if he/she has any questions or concerns regarding our treatment plan. No barriers to understanding were identified. We discussed Red Flag symptoms and signs in detail. Patient expressed understanding regarding what to do in case of urgent or emergency type symptoms.   Medication list was reconciled, printed and provided to the patient in AVS. Patient instructions and summary information was reviewed with the patient as documented in the AVS. This note was prepared with assistance of Dragon voice recognition software. Occasional wrong-word or sound-a-like substitutions may have occurred due to the inherent limitations of voice recognition software

## 2018-04-24 NOTE — Patient Instructions (Signed)
Please return in 6-8 weeks to recheck diabetes.  Please schedule your AWV in April 2020.  We did not make any changes today.   If you have any questions or concerns, please don't hesitate to send me a message via MyChart or call the office at [phone]. Thank you for visiting with [place] today! It's our pleasure caring for you.

## 2018-04-28 DIAGNOSIS — H401111 Primary open-angle glaucoma, right eye, mild stage: Secondary | ICD-10-CM | POA: Diagnosis not present

## 2018-05-09 DIAGNOSIS — M5136 Other intervertebral disc degeneration, lumbar region: Secondary | ICD-10-CM | POA: Diagnosis not present

## 2018-05-09 DIAGNOSIS — M48061 Spinal stenosis, lumbar region without neurogenic claudication: Secondary | ICD-10-CM | POA: Diagnosis not present

## 2018-05-10 ENCOUNTER — Other Ambulatory Visit: Payer: Self-pay | Admitting: *Deleted

## 2018-05-10 MED ORDER — PRECISION XTRA BLOOD GLUCOSE VI STRP: ORAL_STRIP | 3 refills | Status: DC

## 2018-05-19 ENCOUNTER — Telehealth: Payer: Self-pay

## 2018-05-19 NOTE — Telephone Encounter (Signed)
   Primary Cardiologist:  Dr.Jordan.   Patient contacted.  History reviewed.  No symptoms to suggest any unstable cardiac conditions.  Based on discussion, with current pandemic situation, we will be postponing this 05/25/18 appointment for [employer]. If symptoms change, he has been instructed to contact our office.   Appointment rescheduled to 07/17/18 at 1:20 pm.  Kathyrn Lass, LPN  07/17/8414 6:06 PM         .

## 2018-05-25 ENCOUNTER — Ambulatory Visit: Payer: Medicare Other | Admitting: Cardiology

## 2018-06-14 ENCOUNTER — Other Ambulatory Visit: Payer: Self-pay

## 2018-06-14 ENCOUNTER — Ambulatory Visit (INDEPENDENT_AMBULATORY_CARE_PROVIDER_SITE_OTHER): Payer: Medicare Other | Admitting: Family Medicine

## 2018-06-14 ENCOUNTER — Encounter: Payer: Self-pay | Admitting: Family Medicine

## 2018-06-14 ENCOUNTER — Ambulatory Visit: Payer: Medicare Other

## 2018-06-14 DIAGNOSIS — Z794 Long term (current) use of insulin: Secondary | ICD-10-CM | POA: Diagnosis not present

## 2018-06-14 DIAGNOSIS — M51369 Other intervertebral disc degeneration, lumbar region without mention of lumbar back pain or lower extremity pain: Secondary | ICD-10-CM

## 2018-06-14 DIAGNOSIS — M5136 Other intervertebral disc degeneration, lumbar region: Secondary | ICD-10-CM | POA: Diagnosis not present

## 2018-06-14 DIAGNOSIS — K5900 Constipation, unspecified: Secondary | ICD-10-CM

## 2018-06-14 DIAGNOSIS — E119 Type 2 diabetes mellitus without complications: Secondary | ICD-10-CM

## 2018-06-14 DIAGNOSIS — M48062 Spinal stenosis, lumbar region with neurogenic claudication: Secondary | ICD-10-CM | POA: Diagnosis not present

## 2018-06-14 NOTE — Progress Notes (Signed)
I have discussed the procedure for the virtual visit with the patient who has given consent to proceed with assessment and treatment.   Tiara S Simmons, CMA     

## 2018-06-14 NOTE — Patient Instructions (Signed)
F/u in 6-8 weeks in office for diabetes and htn recheck with labs.

## 2018-06-14 NOTE — Progress Notes (Signed)
TELEPHONE ENCOUNTER   Patient verbally agreed to telephone visit and is aware that copayment and coinsurance may apply. Patient was treated using telemedicine according to accepted telemedicine protocols. Pt cannot do video visit due to lack of proper equipment at his home. He is 85 yo.   Location of the patient: home Location of provider: [place], [place] office Names of all persons participating in the telemedicine service and role in the encounter: [name], MD Lilli Light, CMA   Subjective  CC:  Chief Complaint  Patient presents with  . Constipation    He is taking Colace without any relief. His last BM was 06/12/18 and reports stool was hard and he has to strain.  . Diabetes    Blood sugars are 90s-100s  . Back Pain    He reports procedure was cancelled and he is taking Hydrocodone sparingly     HPI: [name] is a 85 y.o. male who was telephoned today to address the problems listed above in the chief complaint.  Constipation: x 2-3 weeks. Has been on occasional norco for back pain. No other changes in diet or medications. Less active due to back pain as well. Passing hard small stools every 2-3 days; feels urge to go but will sit for 30 minutes without results. No blood or melena. No abdominal pain except feeling of needing to move bowels. Has tried mag citrate w/o results. No n/v or fevers.   DM f/u:  Doing very well. fastings are 90-100 w/o any hypoglycemia. Tolerating his medications. Postprandials are "controlled" as well. He feels well. Checks sugars bid. No peripheral neuropathy pain. Had to postpone eye appt due to covid-19 pandemic. Eating well.   Lab Results  Component Value Date   HGBA1C 8.0 (A) 03/24/2018   HGBA1C 8.8 (A) 03/09/2018   HGBA1C 9.0 02/09/2018    Lab Results  Component Value Date   CREATININE 1.05 04/24/2018   BUN 16 04/24/2018   NA 144 04/24/2018   K 4.3 04/24/2018   CL 106 04/24/2018   CO2 30 04/24/2018     Back pain with neurogenic claudication: seeing Dr. Nelva Bush. Was to have an epidural steroid injection but postponed due to covid-19 pandemic. Having a lot of worsening pain. Some days can't stand up straight or walk due to pain. No weakness in legs or bowel/bladder dysfunction.  ASSESSMENT: 1. Constipation, unspecified constipation type   2. Spinal stenosis of lumbar region with neurogenic claudication   3. Diabetes mellitus type 2, insulin dependent ([place])   4. Degeneration of lumbar intervertebral disc      constipation:  Multifactorial: decreased activity and narcotic use as primary factors. Start colace bid and miralax daily. May add mag citrate in 1 week if needed.   Back pain: to see Dr Nelva Bush for further treatment options including meds/steroids and/or injection.   DM is clinically controlled by home readings. Continue same meds. No longer on ACE (believe had stopped after increased creatinine); will consider restarting in f/u after lab work checked. On jardiance, insulin and metformin. Will need to get eye exam  Time spent with the patient (non face-to-face time during this virtual encounter): 25 minutes, spent in obtaining information about his symptoms, reviewing his previous labs, evaluations, and treatments, counseling him about his condition (please see the discussed topics above), and developing a plan to further investigate it; the patient was provided an opportunity to ask questions and all were answered. The patient agreed with the plan and demonstrated an  understanding of the instructions.   The patient was advised to call back or seek an in-person evaluation if the symptoms worsen or if the condition fails to improve as anticipated.  Follow up: DM and BP f/u in 2 months. With labs.   Visit date not found  No orders of the defined types were placed in this encounter.  No orders of the defined types were placed in this encounter.    I reviewed the patients updated PMH, FH,  and SocHx.    Patient Active Problem List   Diagnosis Date Noted  . Spinal stenosis of lumbar region 02/21/2018    Priority: High  . Diabetic peripheral neuropathy associated with type 2 diabetes mellitus ([place]) 05/05/2017    Priority: High  . Atherosclerotic heart disease of native coronary artery without angina pectoris 06/13/2016    Priority: High  . CVA (cerebral vascular accident) ([place]) 06/06/2016    Priority: High  . Recurrent coronary arteriosclerosis after percutaneous transluminal coronary angioplasty 04/18/2015    Priority: High  . CAD S/P PCI- Nov 2016     Priority: High  . Current use of long term anticoagulation 02/06/2013    Priority: High  . Chronic atrial fibrillation 07/30/2011    Priority: High  . Hx of CABG x 2 1990     Priority: High  . Essential (primary) hypertension     Priority: High  . Diabetes mellitus type 2, insulin dependent ([place])     Priority: High  . Hyperlipemia     Priority: High  . Degeneration of lumbar intervertebral disc 02/21/2018    Priority: Medium  . Chronic vertigo 03/15/2014    Priority: Medium  . Obesity (BMI 30.0-34.9) 01/31/2012    Priority: Medium  . Enlarged prostate without lower urinary tract symptoms (luts) 01/04/2011    Priority: Medium  . Gastro-esophageal reflux disease without esophagitis 12/07/2010    Priority: Medium  . Epistaxis, recurrent 04/11/2015    Priority: Low  . Osteoarthritis, knee 01/31/2012    Priority: Low  . Allergic rhinitis 10/28/2011    Priority: Low  . Hearing loss 06/08/2011    Priority: Low  . Primary osteoarthritis of hand 06/08/2011    Priority: Low  . Hematuria 08/27/2008    Priority: Low  . History of revision of total replacement of right hip joint 01/03/2018  . Leukocytosis 06/02/2016  . Carotid stenosis 06/02/2016   Current Meds  Medication Sig  . apixaban (ELIQUIS) 5 MG TABS tablet Take 1 tablet (5 mg total) by mouth 2 (two) times daily.  [name] atorvastatin (LIPITOR) 40 MG tablet  Take 1 tablet (40 mg total) by mouth daily at 6 PM.  . AVODART 0.5 MG capsule Take 0.5 mg by mouth every other day.   . diltiazem (CARDIZEM CD) 360 MG 24 hr capsule Take 1 capsule (360 mg total) by mouth daily.  Regino Schultze Bandages & Supports (KNEE BRACE ADJUSTABLE HINGED) MISC Wear daily as tolerated  . empagliflozin (JARDIANCE) 10 MG TABS tablet Take 10 mg by mouth daily.  [name] HYDROcodone-acetaminophen (NORCO/VICODIN) 5-325 MG tablet hydrocodone 5 mg-acetaminophen 325 mg tablet  Take 1 tablet 3 times a day by oral route as needed.  . insulin glargine (LANTUS) 100 UNIT/ML injection Inject 40 Units into the skin at bedtime.  . meclizine (ANTIVERT) 25 MG tablet Take 1 tablet (25 mg total) by mouth 3 (three) times daily as needed for dizziness.  . metFORMIN (GLUCOPHAGE) 500 MG tablet Take 500 mg by mouth 2 (two) times daily  with a meal.  . metoprolol succinate (TOPROL-XL) 100 MG 24 hr tablet TAKE 1 TABLET BY MOUTH TWICE DAILY  . nitroGLYCERIN (NITROSTAT) 0.4 MG SL tablet Place 0.4 mg under the tongue every 5 (five) minutes as needed for chest pain (x 3 doses).  . pantoprazole (PROTONIX) 40 MG tablet TAKE 1 TABLET BY MOUTH DAILY AT NOON  . PRECISION XTRA TEST STRIPS test strip TEST BLOOD SUGAR 1-2 TIMES DAILY  . Tamsulosin HCl (FLOMAX) 0.4 MG CAPS Take 0.4 mg by mouth every other day.     Allergies: Patient is allergic to erythromycin base; amoxicillin; cefadroxil; ciprofloxacin; and erythromycin. Family History: Patient family history includes Brain cancer in his father; Throat cancer in his brother. Social History:  Patient  reports that he quit smoking about 60 years ago. His smoking use included cigarettes. He has a 30.00 pack-year smoking history. He has never used smokeless tobacco. He reports that he does not drink alcohol or use drugs.  Review of Systems: Constitutional: Negative for fever malaise or anorexia Cardiovascular: negative for chest pain Respiratory: negative for SOB or  persistent cough Gastrointestinal: negative for abdominal pain + back pain   [id number] physician/qualify he will professional telephone evaluation for 21 to 30 minutes

## 2018-06-16 ENCOUNTER — Ambulatory Visit: Payer: Self-pay | Admitting: Family Medicine

## 2018-06-16 ENCOUNTER — Telehealth: Payer: Self-pay | Admitting: *Deleted

## 2018-06-16 NOTE — Telephone Encounter (Signed)
See note

## 2018-06-16 NOTE — Telephone Encounter (Signed)
Called and spoke to [name] about elevated blood sugars. He is aware that prednisone will increase blood sugars and per Dr. Jonni Sanger continue to monitor sugars, drink plenty water, and try not to eat foods with a lot of sugar.  He is wanting to know if there is another Dr he can be sent to for his back pain, he is very unhappy with the care he is receiving there.   *Duplicate message*

## 2018-06-16 NOTE — Telephone Encounter (Signed)
Pt called in c/o his blood sugars being elevated since he started taking prednisone for his back given to him by Dr. Waynetta Pean.   His normal glucose is 90-100.  Yesterday it was 185 and today it's 212.   He is wanting to know if it's from the prednisone.   He mentioned he was going to call Dr. Waynetta Pean too. My computer got knocked off line and we were disconnected.  I sent these triage notes to Dr. Tamela Oddi office for her information and further disposition.    Reason for Disposition . Caller has NON-URGENT medication question about med that PCP prescribed and triager unable to answer question    Prednisone was prescribed by Dr. Waynetta Pean for his back now his glucose is elevated.  Answer Assessment - Initial Assessment Questions 1. SYMPTOMS: "Do you have any symptoms?"    I've been put on prednisone for my back.  Now my glucose is high.   185 yesterday and today 212.   After I started the prednisone my glucose went up.  I 'm just calling to see if it's the prednisone causing my sugar to go up.  2. SEVERITY: If symptoms are present, ask "Are they mild, moderate or severe?"     Elevated glucose since starting prednisone for his back given to him by Dr. Waynetta Pean.  Protocols used: MEDICATION QUESTION CALL-A-AH

## 2018-06-16 NOTE — Telephone Encounter (Signed)
Called and spoke to [name] about elevated blood sugars. He is aware that prednisone will increase blood sugars and per Dr. Jonni Sanger continue to monitor sugars, drink plenty water, and try not to eat foods with a lot of sugar.  He is wanting to know if there is another Dr he can be sent to for his back pain, he is very unhappy with the care he is receiving there.

## 2018-06-18 ENCOUNTER — Other Ambulatory Visit: Payer: Self-pay | Admitting: Family Medicine

## 2018-06-19 NOTE — Telephone Encounter (Signed)
Pt verbalized understanding and reports that prednisone is working very well and he will be seeing Dr. Nelva Bush PA on Tuesday.

## 2018-06-19 NOTE — Telephone Encounter (Signed)
Please call [name]; for right now, I unfortunately can't get him in to a new back doctor due to Covid-19 and his high risk status.  Hopefully, the prednisone will help.  Dr. Nelva Bush typically can be helpful with injections; things are limited due pandemic.  We will readdress in near future.

## 2018-06-20 DIAGNOSIS — M48061 Spinal stenosis, lumbar region without neurogenic claudication: Secondary | ICD-10-CM | POA: Diagnosis not present

## 2018-06-29 DIAGNOSIS — M5136 Other intervertebral disc degeneration, lumbar region: Secondary | ICD-10-CM | POA: Diagnosis not present

## 2018-06-30 ENCOUNTER — Telehealth: Payer: Self-pay | Admitting: Family Medicine

## 2018-06-30 MED ORDER — MECLIZINE HCL 25 MG PO TABS: 25.0000 mg | ORAL_TABLET | Freq: Three times a day (TID) | ORAL | 1 refills | Status: DC | PRN

## 2018-06-30 NOTE — Telephone Encounter (Signed)
See note  Copied from [place] [id number]. Topic: General - Other >> Jun 30, 2018  9:53 AM Leward Quan A wrote: Reason for CRM: Patient called to say that he had the shot done in his back on 06/29/2018 and this am his blood sugar was 239. Said that he had a little dizziness but will take the meclizine (ANTIVERT) 25 MG tablet as soon as he gets a refill on it.

## 2018-06-30 NOTE — Telephone Encounter (Signed)
Can Meclizine be refilled?

## 2018-06-30 NOTE — Telephone Encounter (Signed)
Copied from [place] [id number]. Topic: Quick Communication - Rx Refill/Question >> Jun 30, 2018  9:58 AM Leward Quan A wrote: Medication: meclizine (ANTIVERT) 25 MG tablet   Has the patient contacted their pharmacy? Yes.   (Agent: If no, request that the patient contact the pharmacy for the refill.) (Agent: If yes, when and what did the pharmacy advise?)  Preferred Pharmacy (with phone number or street name): [facility] [place] - [address] [place] [address] [phone] (Phone) [phone] (Fax)    Agent: Please be advised that RX refills may take up to 3 business days. We ask that you follow-up with your pharmacy.

## 2018-06-30 NOTE — Telephone Encounter (Signed)
Refilled meclizine for chronic intermittent vertigo s/p full investigation

## 2018-06-30 NOTE — Telephone Encounter (Signed)
See note

## 2018-07-11 DIAGNOSIS — M5136 Other intervertebral disc degeneration, lumbar region: Secondary | ICD-10-CM | POA: Diagnosis not present

## 2018-07-11 DIAGNOSIS — M48061 Spinal stenosis, lumbar region without neurogenic claudication: Secondary | ICD-10-CM | POA: Diagnosis not present

## 2018-07-12 ENCOUNTER — Other Ambulatory Visit: Payer: Self-pay

## 2018-07-12 ENCOUNTER — Encounter: Payer: Self-pay | Admitting: Podiatry

## 2018-07-12 ENCOUNTER — Ambulatory Visit (INDEPENDENT_AMBULATORY_CARE_PROVIDER_SITE_OTHER): Payer: Medicare Other | Admitting: Podiatry

## 2018-07-12 VITALS — Temp 97.5°F

## 2018-07-12 DIAGNOSIS — B351 Tinea unguium: Secondary | ICD-10-CM

## 2018-07-12 DIAGNOSIS — M79675 Pain in left toe(s): Secondary | ICD-10-CM | POA: Diagnosis not present

## 2018-07-12 DIAGNOSIS — M79674 Pain in right toe(s): Secondary | ICD-10-CM

## 2018-07-12 DIAGNOSIS — E119 Type 2 diabetes mellitus without complications: Secondary | ICD-10-CM

## 2018-07-12 DIAGNOSIS — D689 Coagulation defect, unspecified: Secondary | ICD-10-CM

## 2018-07-12 NOTE — Progress Notes (Signed)
Patient ID: [name], male   DOB: Apr 03, 1928, 85 y.o.   MRN: [id number] Complaint:  Visit Type: Patient returns to my office for continued preventative foot care services. Complaint: Patient states" my nails have grown long and thick and become painful to walk and wear shoes" Patient has been diagnosed with DM with no foot  complications. He presents for preventative foot care services. No changes to ROS.  Patient is on eliquiss.  Podiatric Exam: Vascular: dorsalis pedis and posterior tibial pulses are palpable bilateral. Capillary return is immediate. Temperature gradient is WNL. Skin turgor WNL  Sensorium: Normal Semmes Weinstein monofilament test. Normal tactile sensation bilaterally. Nail Exam: Pt has thick disfigured discolored nails with subungual debris noted bilateral entire nail hallux through fifth toenails Ulcer Exam: There is no evidence of ulcer or pre-ulcerative changes or infection. Orthopedic Exam: Muscle tone and strength are WNL. No limitations in general ROM. No crepitus or effusions noted. Foot type and digits show no abnormalities. Bony prominences are unremarkable. Skin: No Porokeratosis. No infection or ulcers  Diagnosis:  Tinea unguium, Pain in right toe, pain in left toes  Treatment & Plan Procedures and Treatment: Consent by patient was obtained for treatment procedures. The patient understood the discussion of treatment and procedures well. All questions were answered thoroughly reviewed. Debridement of mycotic and hypertrophic toenails, 1 through 5 bilateral and clearing of subungual debris. No ulceration, no infection noted.  Return Visit-Office Procedure: Patient instructed to return to the office for a follow up visit 10 weeks  for continued evaluation and treatment.    Gardiner Barefoot DPM

## 2018-07-15 NOTE — Progress Notes (Signed)
Virtual Visit via Telephone Note   This visit type was conducted due to national recommendations for restrictions regarding the COVID-19 Pandemic (e.g. social distancing) in an effort to limit this patient's exposure and mitigate transmission in our community.  Due to his co-morbid illnesses, this patient is at least at moderate risk for complications without adequate follow up.  This format is felt to be most appropriate for this patient at this time.  The patient did not have access to video technology/had technical difficulties with video requiring transitioning to audio format only (telephone).  All issues noted in this document were discussed and addressed.  No physical exam could be performed with this format.  Please refer to the patient's chart for his  consent to telehealth for [name].   Date:  07/15/2018   ID:  [name], DOB 09-26-1928, MRN [id number]  Patient Location: Home Provider Location: Home  PCP:  [name], MD  Cardiologist:  Katsumi Wisler Martinique MD Electrophysiologist:  None   Evaluation Performed:  Follow-Up Visit  Chief Complaint:  Follow up CAD  History of Present Illness:    [name] is a 85 y.o. male with  a history of chronic atrial fibrillation managed with rate control and anticoagulation with coumadin. He also has a history of CAD s/p CABG in 1990. Echo in 2013 showed normal LV function. Very mild AS.  biatrial enlargement. In October 2016 he had an abnormal [place] which showed inferior wall ischemia. He underwent cardiac cath. This showed occlusion of the proximal LAD and a critical stenosis of the mid RCA. The SVG to diagonal was occluded. The LIMA to the LAD was patent. He had stenting of the mid RCA with a DES. He is on Plavix. He was admitted in early April 2018 with acute vertigo and a fall. He was found on MRI to have a left parietal infarct. Head CT was negative but did show a 60% right carotid stenosis. INR was 1.8. He was  switched from Coumadin to Eliquis. Echo showed mild aortic stenosis, normal LV function. No source of emboli. Zocor was switched to Lipitor and lantus dose was increased. He was DC with home PT. He has since been started on Jardiance.   In May 2019 he was treated for CAP. In July he had stitches for lacerated finger. CT chest in August showed calcification of the pericardium but resolution of RUL infiltrate.   In December he developed symptoms of lumbar stenosis. He did have steroid injection and a medrol dose pack. He still has intermittent pain. He has some intermittent dizziness. No chest pain or SOB.   Past Medical History:  Diagnosis Date  . Abdominal pain   . Acute renal failure ([place])     resolved  . Arthritis    "hands & legs" (11/'10/2014)  . Ataxia   . Atrial fibrillation ([place])   . Bladder outlet obstruction   . Bladder outlet obstruction   . BPH (benign prostatic hyperplasia)   . CAD (coronary artery disease)    a. CABG IN 1989. b. 01/08/2015 CTO of ost LAD, LIMA to LAD not visualized but assumed patent given myoview finding, occluded SVG to diagonal, 99% mid RCA tx w/ SYNERGY DES 3X28 mm  . Constipation   . Diabetes mellitus type 2, insulin dependent ([place])   . Epistaxis, recurrent Feb 2017  . GERD (gastroesophageal reflux disease)   . HTN (hypertension)   . Hx of bacterial pneumonia   . Hyperlipemia   .  Kidney stones   . Mild aortic stenosis   . Rib fractures    left rib fractures being treated with pain medications  . Stented coronary artery Nov 2016   RCA DES  . Urinary tract infection     Enterococcus   Past Surgical History:  Procedure Laterality Date  . CARDIAC CATHETERIZATION  1989  . CARDIAC CATHETERIZATION N/A 01/08/2015   Procedure: Left Heart Cath and Cors/Grafts Angiography;  Surgeon: Jabe Jeanbaptiste M Martinique, MD;  Location: [facility];  Service: Cardiovascular;  Laterality: N/A;  . CARDIAC CATHETERIZATION  01/08/2015   Procedure: Coronary Stent  Intervention;  Surgeon: Vinson Tietze M [place], MD;  Location: [facility];  Service: Cardiovascular;;  . CARPAL TUNNEL RELEASE Right 08/2010  . CATARACT EXTRACTION W/ INTRAOCULAR LENS  IMPLANT, BILATERAL Bilateral   . CORONARY ANGIOPLASTY  01/08/15   RCA DES  . CORONARY ARTERY BYPASS GRAFT  1989   "CABG X 2"  . JOINT REPLACEMENT    . TONSILLECTOMY    . TOTAL HIP ARTHROPLASTY Right 2000     No outpatient medications have been marked as taking for the 07/17/18 encounter (Appointment) with Martinique, Inman Fettig M, MD.     Allergies:   Erythromycin base; Amoxicillin; Cefadroxil; Ciprofloxacin; and Erythromycin   Social History   Tobacco Use  . Smoking status: Former Smoker    Packs/day: 3.00    Years: 10.00    Pack years: 30.00    Types: Cigarettes    Last attempt to quit: 05/11/1958    Years since quitting: 60.2  . Smokeless tobacco: Never Used  Substance Use Topics  . Alcohol use: No  . Drug use: No     Family Hx: The patient's family history includes Brain cancer in his father; Throat cancer in his brother.  ROS:   Please see the history of present illness.    All other systems reviewed and are negative.   Prior CV studies:   The following studies were reviewed today:  Echo 06/08/16: Study Conclusions  - Left ventricle: The cavity size was normal. There was mild   concentric hypertrophy. Systolic function was vigorous. The   estimated ejection fraction was in the range of 65% to 70%. Wall   motion was normal; there were no regional wall motion   abnormalities. The study is not technically sufficient to allow   evaluation of LV diastolic function. - Aortic valve: Trileaflet; moderately thickened, moderately   calcified leaflets. Valve mobility was restricted. There was mild   stenosis. There was trivial regurgitation. Peak velocity (S): 254   cm/s. Mean gradient (S): 12 mm Hg. Valve area (VTI): 1.59 cm^2. - Mitral valve: Calcified annulus. Mildly thickened leaflets .    There was mild regurgitation. - Left atrium: The atrium was mildly dilated. - Tricuspid valve: There was mild regurgitation. - Pulmonary arteries: Systolic pressure was mildly increased. PA   peak pressure: 33 mm Hg (S).  Impressions:  - Aortic stenosis has progressed since prior echocardiogram.   Labs/Other Tests and Data Reviewed:    EKG:  No ECG reviewed.  Recent Labs: 03/24/2018: ALT 10; Hemoglobin 12.3; Platelets 262.0; TSH 1.92 04/24/2018: BUN 16; Creatinine, Ser 1.05; Potassium 4.3; Sodium 144   Recent Lipid Panel Lab Results  Component Value Date/Time   CHOL 146 12/07/2017 09:25 AM   TRIG 78.0 12/07/2017 09:25 AM   HDL 44.70 12/07/2017 09:25 AM   CHOLHDL 3 12/07/2017 09:25 AM   LDLCALC 85 12/07/2017 09:25 AM    Wt  Readings from Last 3 Encounters:  04/24/18 205 lb 3.2 oz (93.1 kg)  03/24/18 205 lb 6.4 oz (93.2 kg)  03/09/18 205 lb 9.6 oz (93.3 kg)     Objective:    Vital Signs:  There were no vitals taken for this visit.   VITAL SIGNS:  reviewed  ASSESSMENT & PLAN:    1. Chronic atrial fibrillation. Rate is well controlled on   Toprol XL to 100 mg bid and Cardizem CD 360 mg daily.   On Eliquis for anticoagulation. Asymptomatic.   2. CAD s/p remote CABG 25 years ago. S/p DES of RCA 01/08/15. He has stable class 1 angina.  3. Left parietal CVA with ataxia. On Eliquis.   4. HTN - his BP monitor is broke. Is planning to get a new one.  5. DM type 2- on insulin, metformin,and  Jardiance.   6. Hyperlipidemia. On lipitor 40 mg.    7. Carotid arterial disease. No change by dopplers in October 2018. Scheduled for repeat dopplers at [facility] in 2 weeks.    8. Pericardial calcification noted incidentally on CT.  9. Mild aortic stenosis  10. Lumbar stenosis. Managed conservatively to date  COVID-34 Education: The signs and symptoms of COVID-19 were discussed with the patient and how to seek care for testing (follow up with PCP or arrange E-visit).  The  importance of social distancing was discussed today.  Time:   Today, I have spent 10 minutes with the patient with telehealth technology discussing the above problems.     Medication Adjustments/Labs and Tests Ordered: Current medicines are reviewed at length with the patient today.  Concerns regarding medicines are outlined above.   Tests Ordered: No orders of the defined types were placed in this encounter.   Medication Changes: No orders of the defined types were placed in this encounter.   Disposition:  Follow up in 6 month(s)  Signed, Verla Bryngelson Martin[place], MD  07/15/2018 9:29 AM    [facility]

## 2018-07-17 ENCOUNTER — Telehealth (INDEPENDENT_AMBULATORY_CARE_PROVIDER_SITE_OTHER): Payer: Medicare Other | Admitting: Cardiology

## 2018-07-17 ENCOUNTER — Encounter: Payer: Self-pay | Admitting: Cardiology

## 2018-07-17 VITALS — Ht 66.0 in | Wt 196.0 lb

## 2018-07-17 DIAGNOSIS — I10 Essential (primary) hypertension: Secondary | ICD-10-CM

## 2018-07-17 DIAGNOSIS — I6529 Occlusion and stenosis of unspecified carotid artery: Secondary | ICD-10-CM

## 2018-07-17 DIAGNOSIS — I25708 Atherosclerosis of coronary artery bypass graft(s), unspecified, with other forms of angina pectoris: Secondary | ICD-10-CM | POA: Diagnosis not present

## 2018-07-17 DIAGNOSIS — I482 Chronic atrial fibrillation, unspecified: Secondary | ICD-10-CM

## 2018-07-17 NOTE — Patient Instructions (Addendum)
Continue your current therapy  Follow up in 6 months     Call 3 months before to schedule.

## 2018-07-19 IMAGING — DX DG CHEST 2V
2 series · 2 of 2 positions shown · non-contrast
Comparison: 01/06/2015

CLINICAL DATA: Dizziness, recent fall, shortness of breath, chest
pain and cough

EXAM:
CHEST  2 VIEW

[chest lat]
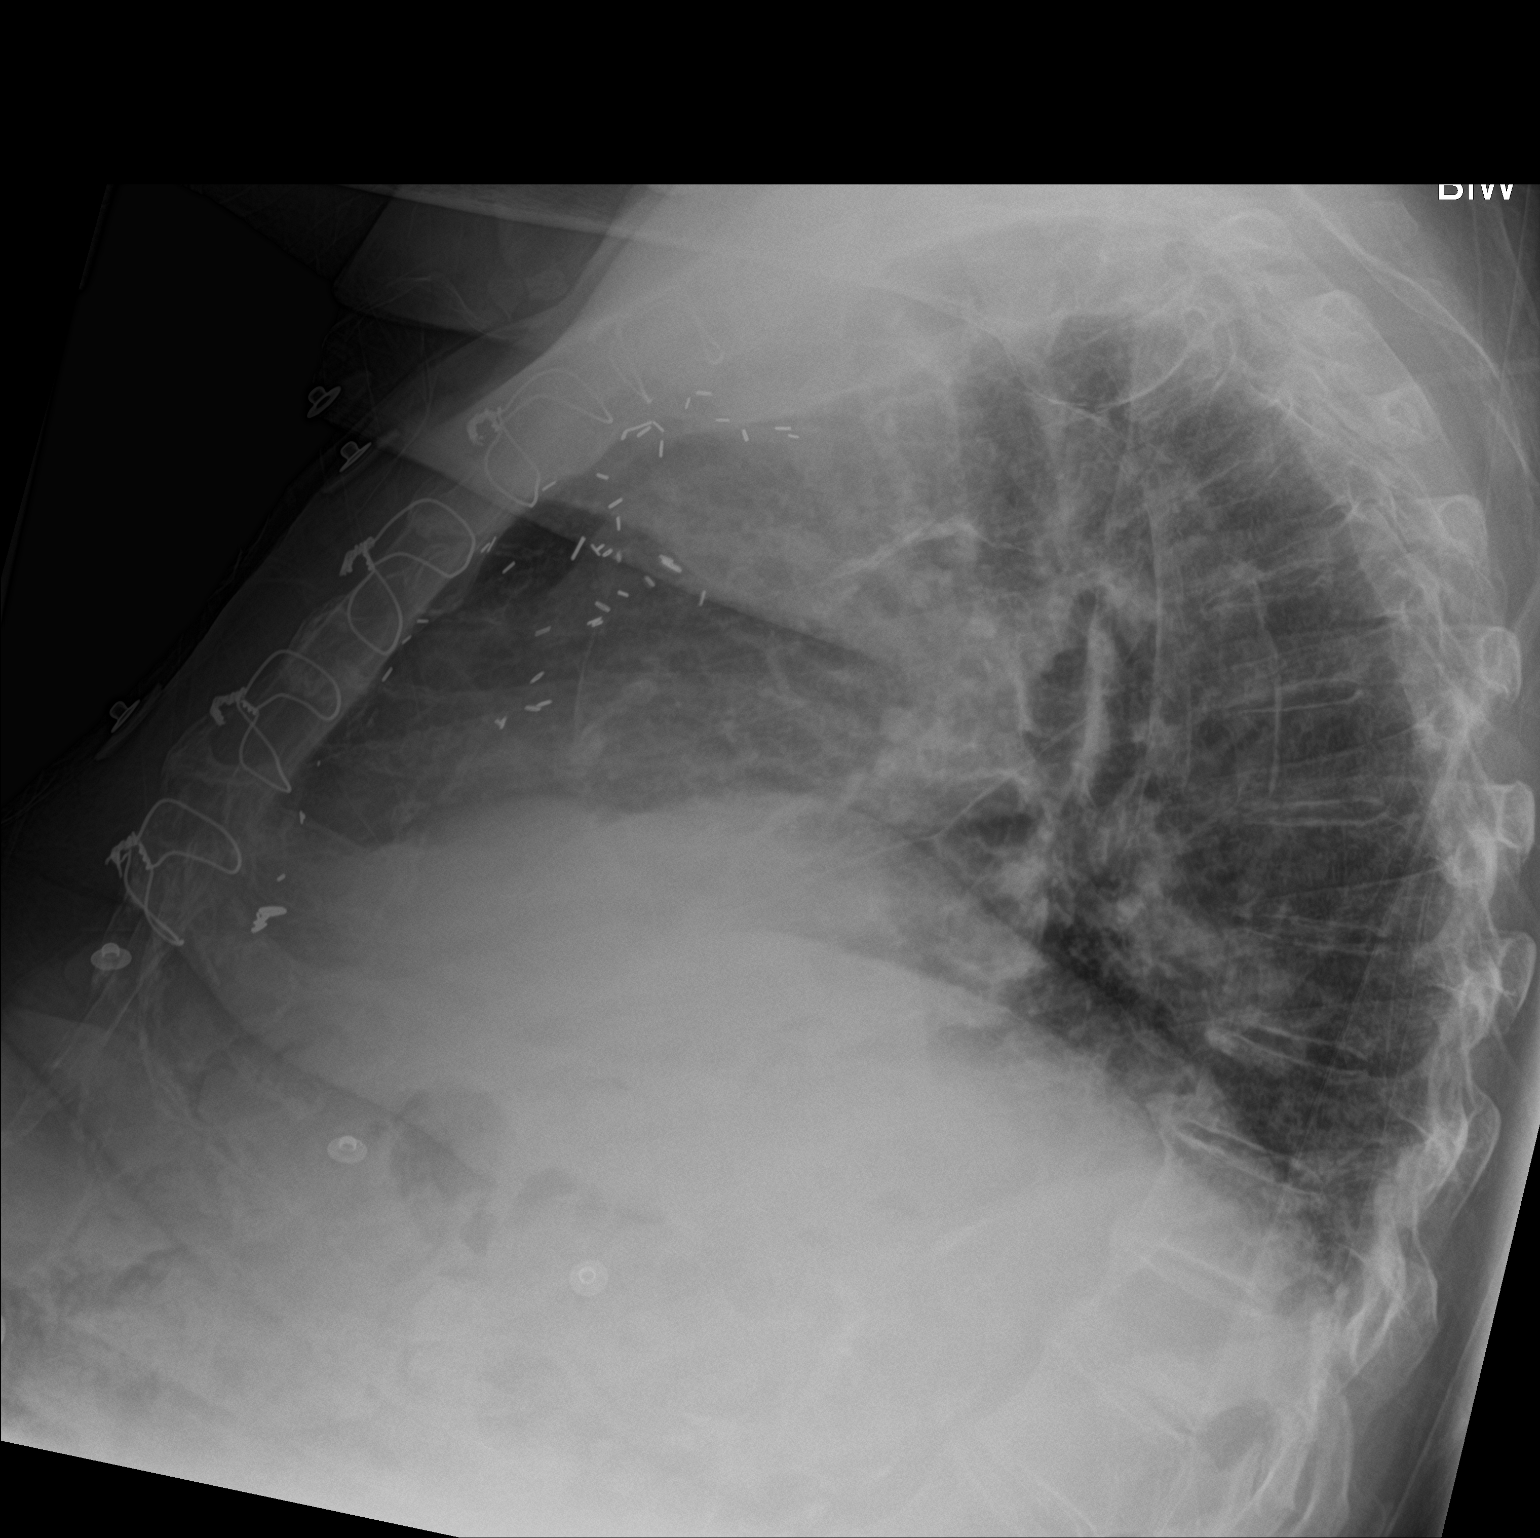

[chest ap]
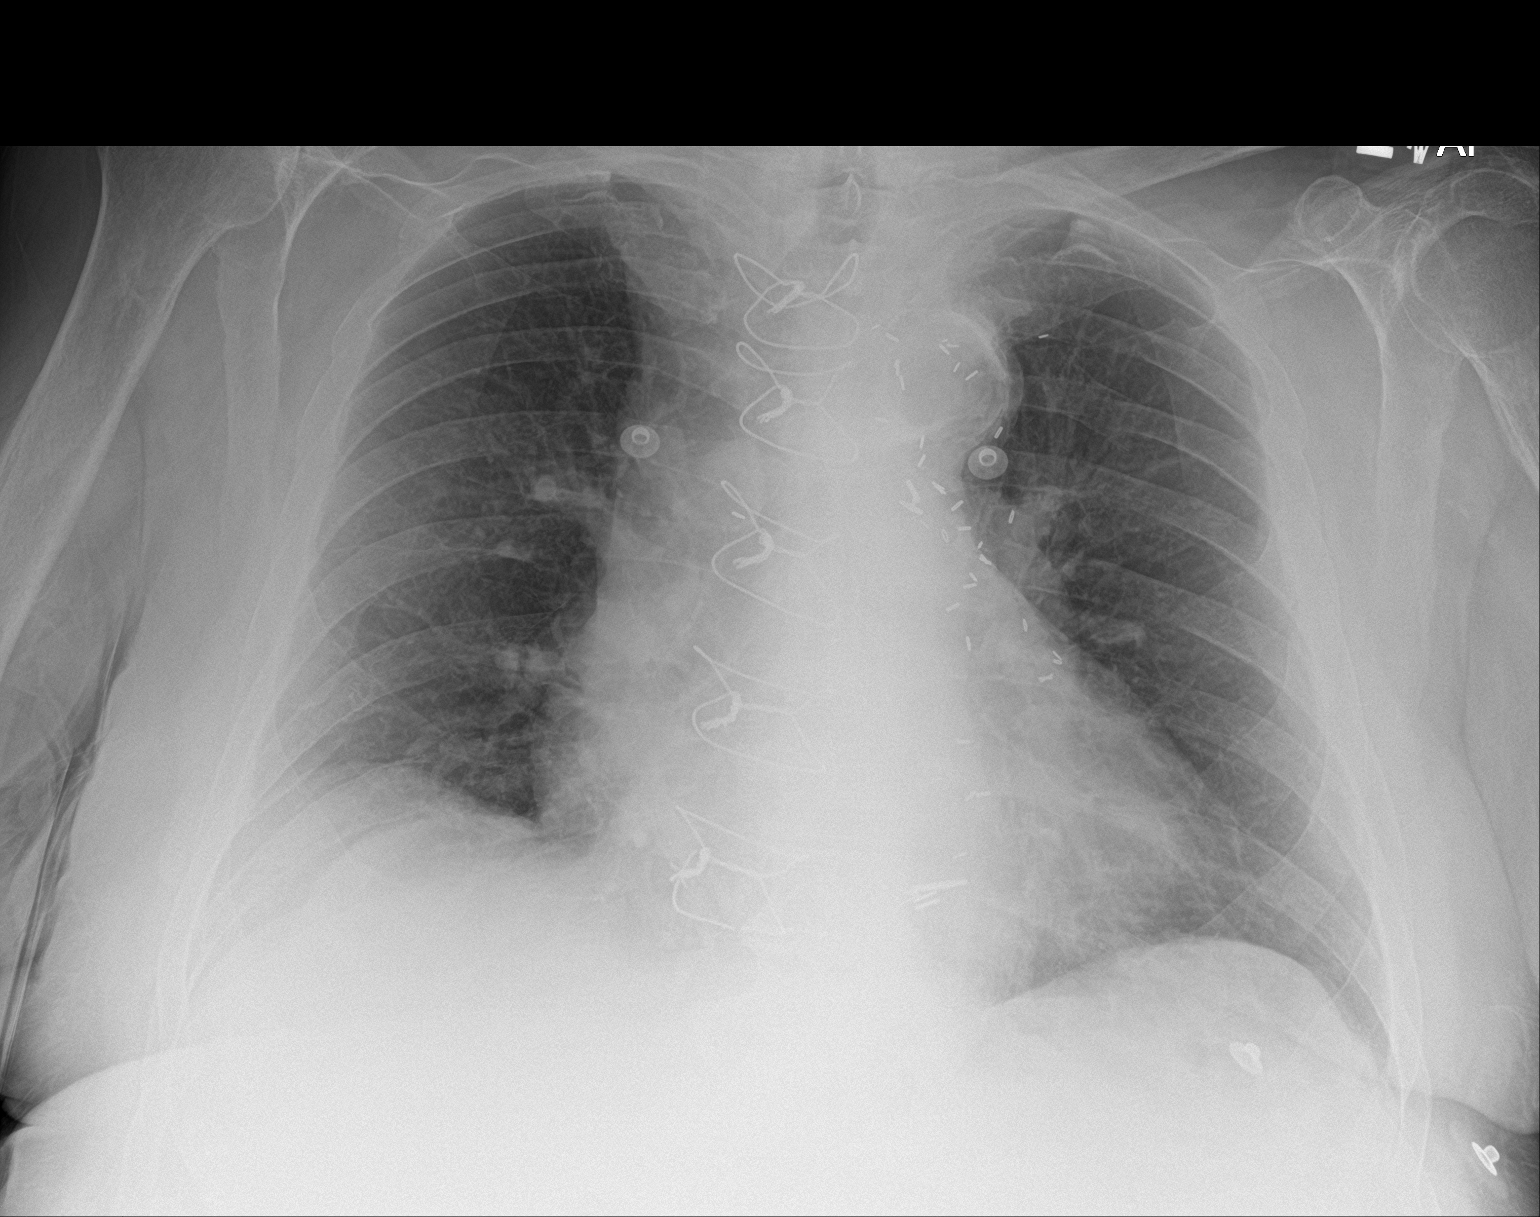

[2 of 2 positions shown; findings below may reference images not displayed]

FINDINGS: Cardiomegaly evident with mild central vascular congestion. No
superimposed CHF, edema, collapse, consolidation or pneumonia. No
large effusion or pneumothorax. Aorta is atherosclerotic and
tortuous. Remote coronary bypass changes. Chronic diffuse thoracic
spondylosis. Mild increased thoracic kyphosis. No severe compression
fracture deformity. All posterolateral rib fractures.
IMPRESSION: Stable cardiomegaly without CHF or pneumonia. No significant
interval change or superimposed acute process

Thoracic aortic atherosclerosis and tortuosity

## 2018-07-19 IMAGING — MR MR CERVICAL SPINE W/O CM
4 of 7 series · 18 of 48 positions shown · non-contrast
Comparison: CT scan 06/02/2013

CLINICAL DATA: Dizziness. Fell and hit head. Acute infarct noted on
MRI.

EXAM:
MRI CERVICAL SPINE WITHOUT CONTRAST
TECHNIQUE: Multiplanar, multisequence MR imaging of the cervical spine was
performed. No intravenous contrast was administered.

[Series 3: T2 · sagittal · 3.0mm · 0.43mm/px · 4 of 14 slices shown (1 of 2)]
[im 1/14]
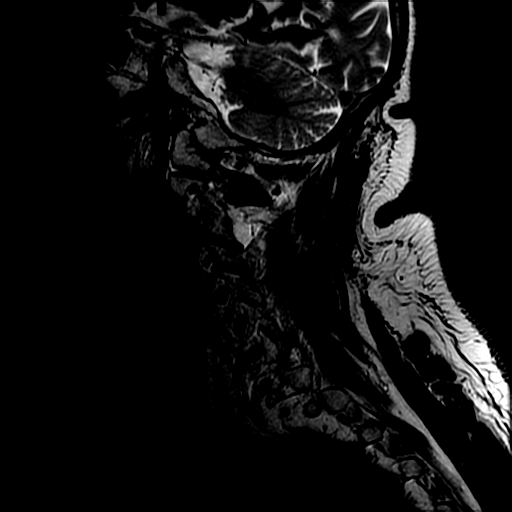
[im 5/14]
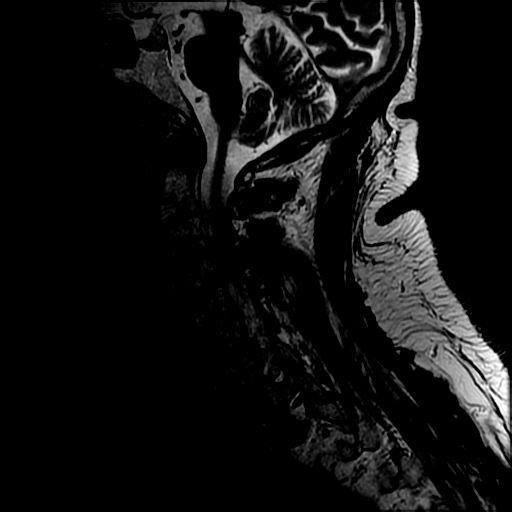
[im 9/14]
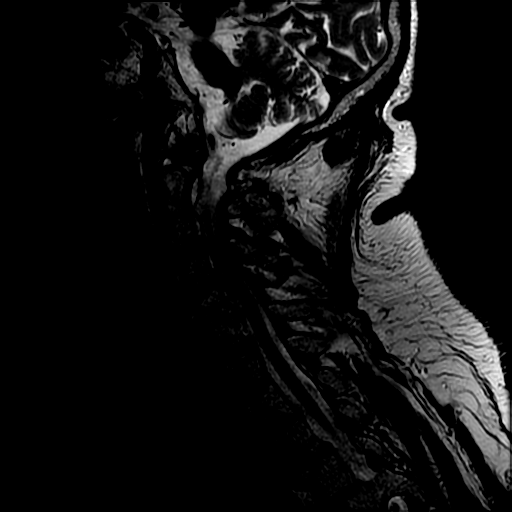
[im 14/14]
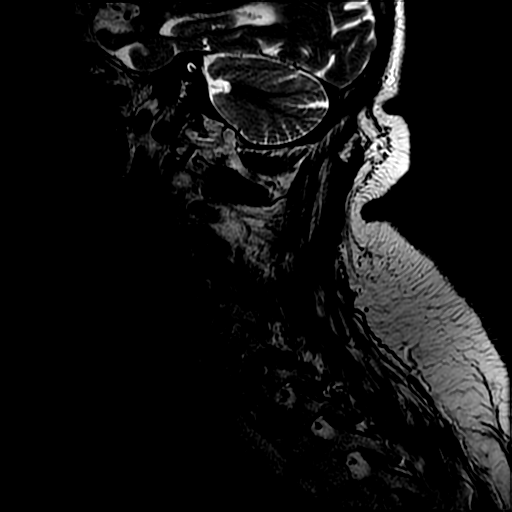

[Series 4: T1 · sagittal · 3.0mm · 0.43mm/px · 3 of 14 slices shown (1 of 2)]
[im 1/14]
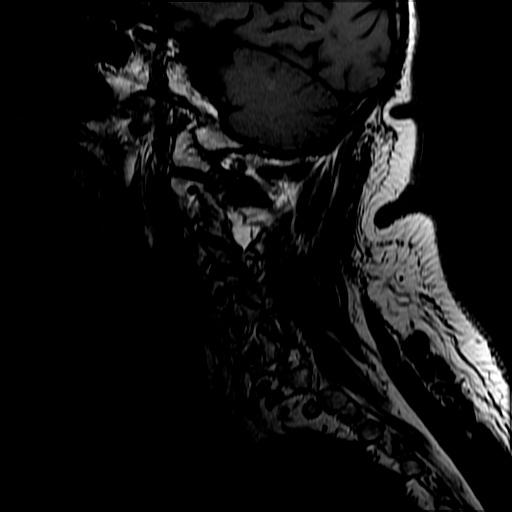
[im 9/14]
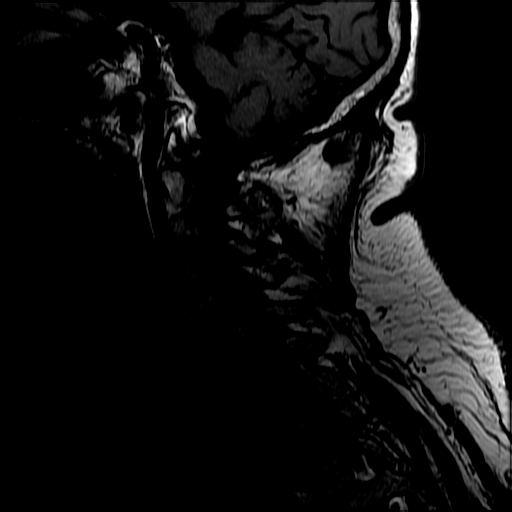
[im 14/14]
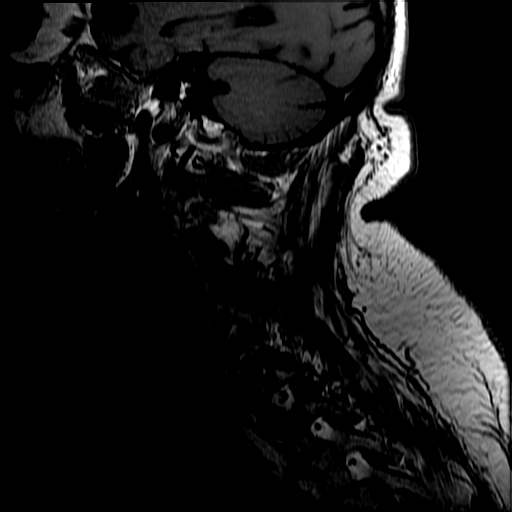

[Series 7: T2 · axial · 3.0mm · 0.39mm/px · z∈[-120,-3]mm · 8 of 40 slices shown (2 of 2)]
[im 1/40]
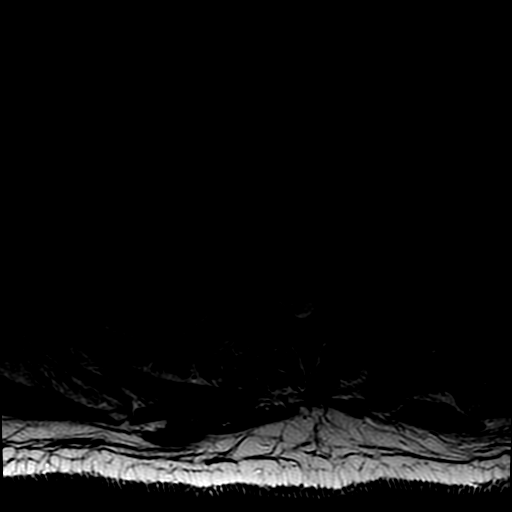
[im 4/40]
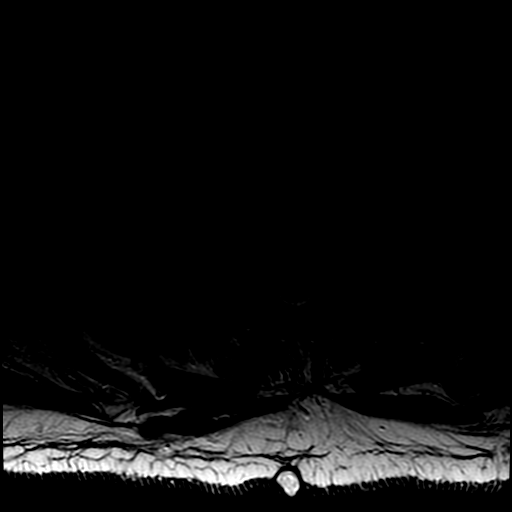
[im 8/40]
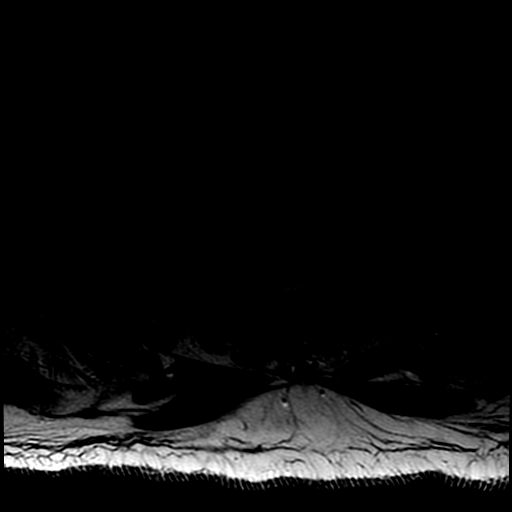
[im 12/40]
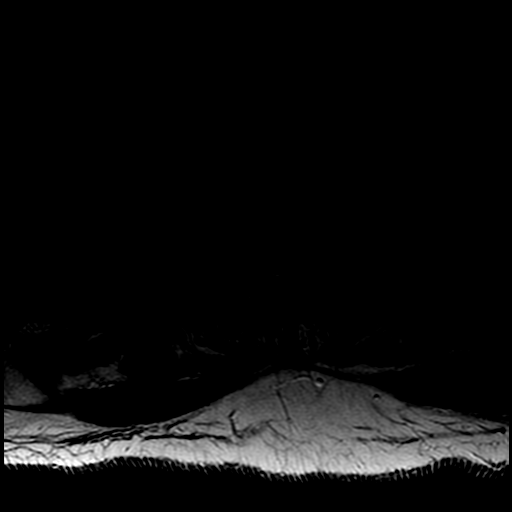
[im 16/40]
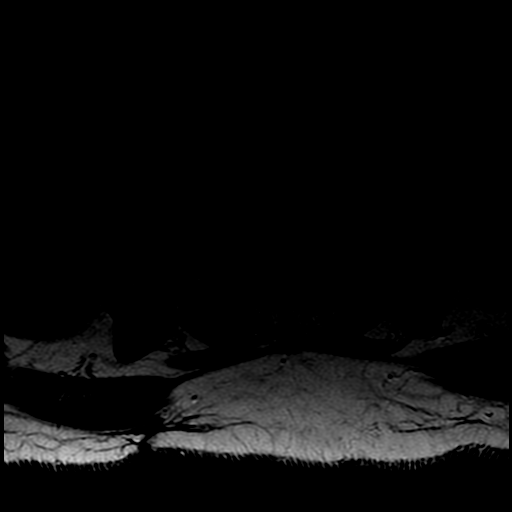
[im 20/40]
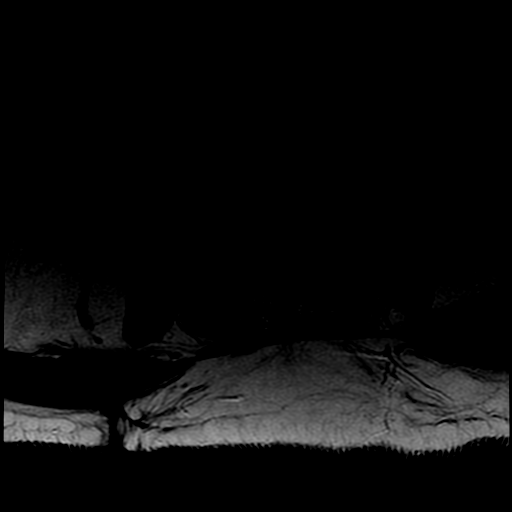
[im 24/40]
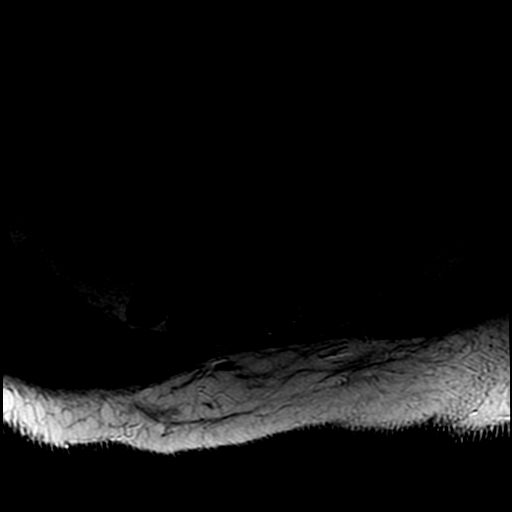
[im 36/40]
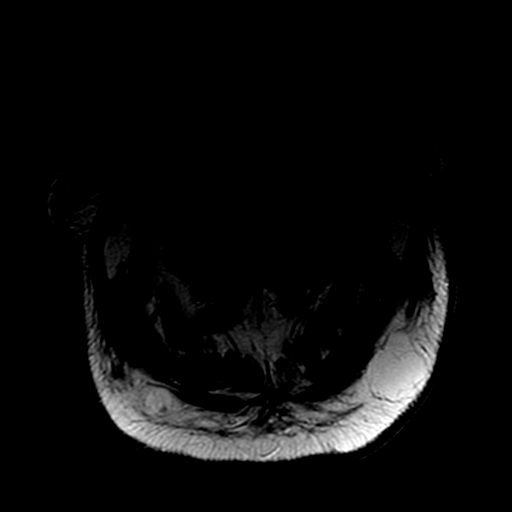

[Series 8: T1 · axial · non-contrast · 3.0mm · 0.39mm/px · z∈[-110,-3]mm · 3 of 40 slices shown (2 of 2)]
[im 4/40]
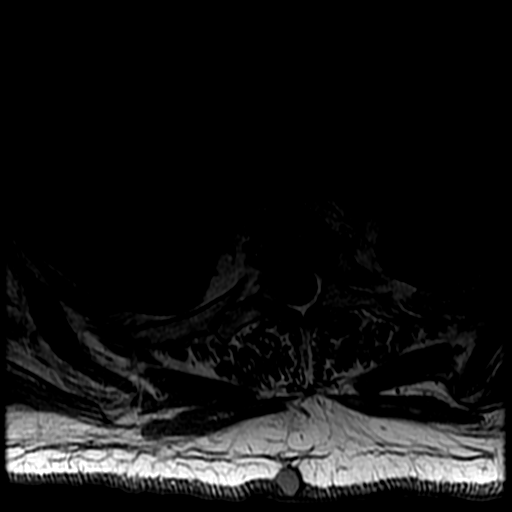
[im 20/40]
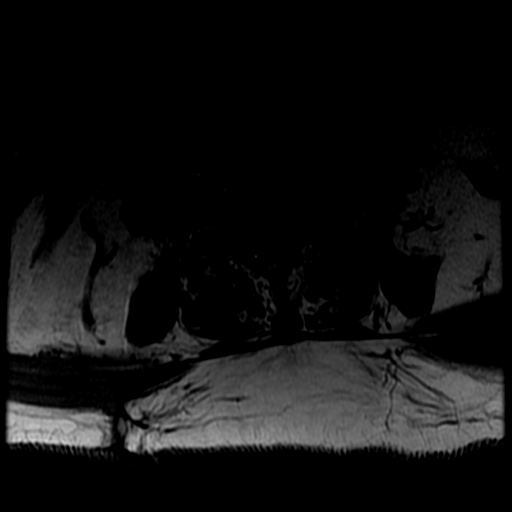
[im 36/40]
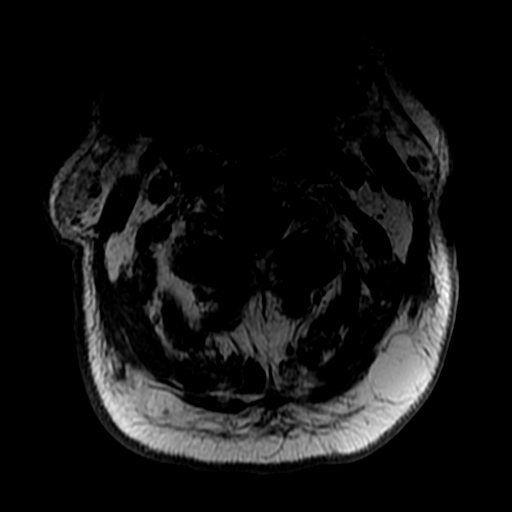

[18 of 48 positions shown; findings below may reference images not displayed]

FINDINGS: Alignment: Normal overall alignment. Interbody fusion changes at
C5-6 and C6-7.

Vertebrae: No acute fracture or bone lesion. No abnormal increased
STIR signal intensity in the posterior elements or soft tissues.

Cord: Normal signal intensity.  No cord lesions or syrinx.

Posterior Fossa, vertebral arteries, paraspinal tissues: No
significant findings.

Disc levels:

C2-3:  No significant findings.

C3-4: Broad-based disc protrusion with mass effect on the ventral
thecal sac and narrowing of the ventral CSF space. There is also
uncinate spurring changes bilaterally, right greater than left with
mild right foraminal stenosis.

C4-5: Disc disease and facet disease. Bulging annulus, osteophytic
ridging uncinate spurring with flattening of the ventral thecal sac
and narrowing of the ventral CSF space. Mild to moderate bilateral
foraminal stenosis.

C5-6: Interbody fusion changes. Osteophytic ridging posteriorly to
the left with mass effect on the thecal sac. No significant
foraminal stenosis.

C6-7: Interbody fusion changes. Osteophytic ridging posteriorly with
mild impression on the ventral thecal sac. No significant foraminal
stenosis.

C7-T1:  No significant findings.

T1-2: Small left-sided disc osteophyte complex with mild mass effect
on the thecal sac and possibly affecting the left T1 nerve root.
IMPRESSION: 1. Degenerative cervical spondylosis with multilevel disc disease
and facet disease. Interbody fusion changes at C5-6 and C6-7 are
noted.
2. Multilevel spinal and foraminal stenosis as described above at
the individual levels.
3. No acute bony abnormality or cord injury.

## 2018-08-03 ENCOUNTER — Encounter: Payer: Self-pay | Admitting: Family Medicine

## 2018-08-03 ENCOUNTER — Ambulatory Visit (INDEPENDENT_AMBULATORY_CARE_PROVIDER_SITE_OTHER): Payer: Medicare Other | Admitting: Family Medicine

## 2018-08-03 ENCOUNTER — Other Ambulatory Visit: Payer: Self-pay

## 2018-08-03 VITALS — BP 135/79 | HR 77 | Temp 97.9°F | Ht 66.0 in | Wt 203.0 lb

## 2018-08-03 DIAGNOSIS — I10 Essential (primary) hypertension: Secondary | ICD-10-CM | POA: Diagnosis not present

## 2018-08-03 DIAGNOSIS — M48062 Spinal stenosis, lumbar region with neurogenic claudication: Secondary | ICD-10-CM | POA: Diagnosis not present

## 2018-08-03 DIAGNOSIS — I482 Chronic atrial fibrillation, unspecified: Secondary | ICD-10-CM

## 2018-08-03 DIAGNOSIS — E119 Type 2 diabetes mellitus without complications: Secondary | ICD-10-CM | POA: Diagnosis not present

## 2018-08-03 DIAGNOSIS — E1142 Type 2 diabetes mellitus with diabetic polyneuropathy: Secondary | ICD-10-CM

## 2018-08-03 DIAGNOSIS — Z794 Long term (current) use of insulin: Secondary | ICD-10-CM | POA: Diagnosis not present

## 2018-08-03 LAB — POCT GLYCOSYLATED HEMOGLOBIN (HGB A1C): Hemoglobin A1C: 8.8 % — AB (ref 4.0–5.6)

## 2018-08-03 NOTE — Progress Notes (Signed)
Subjective  CC:  Chief Complaint  Patient presents with  . Follow-up    Hypertension  . Diabetes    HPI: [name] is a 85 y.o. male who presents to the office today for follow up of diabetes and problems listed above in the chief complaint.   Diabetes follow up:  His diabetic control is reported as Improved.  His sugars have been running high due to around prednisone and an epidural steroid injection about 6 weeks ago.  However over the last 2 to 3 weeks, his fastings are running around 100-120.  He did have one symptomatic low at 66 for unclear reasons.  He is only checking them in the morning.  He has a history of postprandial hyperglycemia. He denies exertional CP or SOB or symptomatic hypoglycemia. He denies foot sores or paresthesias.   Hypertension: Has been well controlled.  Continues on current medications without adverse effects.  No chest pain.  No palpitations.  He is rate controlled with his A. fib on oral anticoagulation  Low back pain due to spinal stenosis: Reviewed his most recent flare.  He had significant and persistent low back pain treated by orthopedics.  Ultimately did well with a prednisone taper and then an epidural steroid injection.  He is now feeling well without symptoms of claudication or chronic pain.  Diabetic foot care: Sees podiatry every 3 months.  No sores, wounds or calluses. Wt Readings from Last 3 Encounters:  08/03/18 203 lb (92.1 kg)  07/17/18 196 lb (88.9 kg)  04/24/18 205 lb 3.2 oz (93.1 kg)    BP Readings from Last 3 Encounters:  08/03/18 135/79  04/24/18 (!) 138/42  03/24/18 130/73    Assessment  1. Diabetes mellitus type 2, insulin dependent ([place])   2. Essential (primary) hypertension   3. Spinal stenosis of lumbar region with neurogenic claudication   4. Diabetic peripheral neuropathy associated with type 2 diabetes mellitus ([place])   5. Chronic atrial fibrillation      Plan   Diabetes is currently adequately controlled.   Patient with multiple comorbidities with a goal A1c of less than 8.5.  A1c is mildly elevated now due to recent steroid use.  We will continue current management without changes and recheck in 3 months.  Fasting showed that his control is now trending downward again.  Hypertension is well controlled as is A. fib.  Continue per cardiology.  Lumbar stenosis: Much improved.  Lab work is up-to-date.  Follow up: Recheck in 3 months. Orders Placed This Encounter  Procedures  . POCT glycosylated hemoglobin (Hb A1C)   No orders of the defined types were placed in this encounter.     Immunization History  Administered Date(s) Administered  . Influenza Split 11/14/2007, 01/01/2009, 12/23/2009  . Influenza, High Dose Seasonal PF 11/02/2012, 12/12/2013, 11/15/2014, 11/24/2017  . Influenza, Seasonal, Injecte, Preservative Fre 11/18/2015  . Influenza,trivalent, recombinat, inj, PF 12/07/2010  . Influenza-Unspecified 10/28/2011, 11/30/2014, 02/15/2017  . Pneumococcal Conjugate-13 12/13/2013  . Pneumococcal-Unspecified 08/20/2008  . Td 05/29/2002  . Tdap 10/28/2011, 02/15/2017  . Zoster 08/20/2008  . Zoster Recombinat (Shingrix) 10/13/2016, 12/13/2016    Diabetes Related Lab Review: Lab Results  Component Value Date   HGBA1C 8.8 (A) 08/03/2018   HGBA1C 8.0 (A) 03/24/2018   HGBA1C 8.8 (A) 03/09/2018    Lab Results  Component Value Date   MICROALBUR 3.5 (H) 08/01/2017   Lab Results  Component Value Date   CREATININE 1.05 04/24/2018   BUN 16 04/24/2018  NA 144 04/24/2018   K 4.3 04/24/2018   CL 106 04/24/2018   CO2 30 04/24/2018   Lab Results  Component Value Date   CHOL 146 12/07/2017   CHOL 159 07/20/2017   CHOL 178 06/07/2016   Lab Results  Component Value Date   HDL 44.70 12/07/2017   HDL 42 07/20/2017   HDL 45 06/07/2016   Lab Results  Component Value Date   LDLCALC 85 12/07/2017   LDLCALC 89 07/20/2017   LDLCALC 61 01/11/2017   Lab Results  Component Value  Date   TRIG 78.0 12/07/2017   TRIG 139 07/20/2017   TRIG 133 06/07/2016   Lab Results  Component Value Date   CHOLHDL 3 12/07/2017   CHOLHDL 4.0 06/07/2016   No results found for: LDLDIRECT The ASCVD Risk score Mikey Bussing DC Jr., et al., 2013) failed to calculate for the following reasons:   The 2013 ASCVD risk score is only valid for ages 8 to 31   The patient has a prior MI or stroke diagnosis I have reviewed the PMH, Fam and Soc history. Patient Active Problem List   Diagnosis Date Noted  . Spinal stenosis of lumbar region 02/21/2018    Priority: High  . Diabetic peripheral neuropathy associated with type 2 diabetes mellitus ([place]) 05/05/2017    Priority: High    No pain   . Atherosclerotic heart disease of native coronary artery without angina pectoris 06/13/2016    Priority: High    Overview:  Overview:  RCA DES placed Nov 2016, LIMA-LAD presumed patent based on Myoview but no visualized, SVG-Dx occluded  Last Assessment & Plan:  RCA DES placed Nov 2016, LIMA-LAD presumed patent based on Myoview but no visualized, SVG-Dx occluded   . CVA (cerebral vascular accident) ([place]) 06/06/2016    Priority: High  . Recurrent coronary arteriosclerosis after percutaneous transluminal coronary angioplasty 04/18/2015    Priority: High    Overview:  Overview:  RCA DES placed Nov 2016, LIMA-LAD presumed patent based on Myoview but no visualized, SVG-Dx occluded  Last Assessment & Plan:  RCA DES placed Nov 2016, LIMA-LAD presumed patent based on Myoview but no visualized, SVG-Dx occluded   . CAD S/P PCI- Nov 2016     Priority: High    RCA DES placed Nov 2016, LIMA-LAD presumed patent based on Myoview but no visualized, SVG-Dx occluded   . Current use of long term anticoagulation 02/06/2013    Priority: High    Overview:  Monitored by cardiology   . Chronic atrial fibrillation 07/30/2011    Priority: High    CHADs VASc=5 for age, HTN, vascular disease, and DM   . Hx of CABG x 2  1990     Priority: High    Status post CABG x2 in 1990 including an LIMA graft to the LAD, and a vein graft to the diagonal.    . Essential (primary) hypertension     Priority: High  . Diabetes mellitus type 2, insulin dependent ([place])     Priority: High  . Hyperlipemia     Priority: High  . Degeneration of lumbar intervertebral disc 02/21/2018    Priority: Medium  . Chronic vertigo 03/15/2014    Priority: Medium  . Obesity (BMI 30.0-34.9) 01/31/2012    Priority: Medium  . Enlarged prostate without lower urinary tract symptoms (luts) 01/04/2011    Priority: Medium    Overview:  Urology - avodart and tamuloscin   . Gastro-esophageal reflux disease without esophagitis 12/07/2010  Priority: Medium  . Epistaxis, recurrent 04/11/2015    Priority: Low  . Osteoarthritis, knee 01/31/2012    Priority: Low  . Allergic rhinitis 10/28/2011    Priority: Low  . Hearing loss 06/08/2011    Priority: Low  . Primary osteoarthritis of hand 06/08/2011    Priority: Low  . Hematuria 08/27/2008    Priority: Low    Overview:  Essential Hematuria - urology - Grapey  w/u benign  10/1 IMO update Overview:  Overview:  Essential Hematuria - urology - Grapey  w/u benign   . History of revision of total replacement of right hip joint 01/03/2018  . Leukocytosis 06/02/2016  . Carotid stenosis 06/02/2016    Social History: Patient  reports that he quit smoking about 60 years ago. His smoking use included cigarettes. He has a 30.00 pack-year smoking history. He has never used smokeless tobacco. He reports that he does not drink alcohol or use drugs.  Review of Systems: Ophthalmic: negative for eye pain, loss of vision or double vision Cardiovascular: negative for chest pain Respiratory: negative for SOB or persistent cough Gastrointestinal: negative for abdominal pain Genitourinary: negative for dysuria or gross hematuria MSK: negative for foot lesions Neurologic: negative for weakness or  gait disturbance  Objective  Vitals: BP 135/79 (BP Location: Right Arm, Patient Position: Sitting, Cuff Size: Normal)   Pulse 77   Temp 97.9 F (36.6 C) (Oral)   Ht 5\' 6"  (1.676 m)   Wt 203 lb (92.1 kg)   SpO2 96%   BMI 32.77 kg/m  General: well appearing, no acute distress, he looks great Psych:  Alert and oriented, normal mood and affect HEENT:  Normocephalic, atraumatic, moist mucous membranes, supple neck  Cardiovascular: Irregularly irregular, trace edema bilaterally Respiratory:  Good breath sounds bilaterally, CTAB with normal effort, no rales Gastrointestinal: normal BS, soft, nontender Skin:  Warm, no rashes Neurologic:   Mental status is normal. normal gait Foot exam: Patient defers exam since he just had it done at his podiatrist office.    Diabetic education: ongoing education regarding chronic disease management for diabetes was given today. We continue to reinforce the ABC's of diabetic management: A1c (<7 or 8 dependent upon patient), tight blood pressure control, and cholesterol management with goal LDL < 100 minimally. We discuss diet strategies, exercise recommendations, medication options and possible side effects. At each visit, we review recommended immunizations and preventive care recommendations for diabetics and stress that good diabetic control can prevent other problems. See below for this patient's data.    Commons side effects, risks, benefits, and alternatives for medications and treatment plan prescribed today were discussed, and the patient expressed understanding of the given instructions. Patient is instructed to call or message via MyChart if he/she has any questions or concerns regarding our treatment plan. No barriers to understanding were identified. We discussed Red Flag symptoms and signs in detail. Patient expressed understanding regarding what to do in case of urgent or emergency type symptoms.   Medication list was reconciled, printed and  provided to the patient in AVS. Patient instructions and summary information was reviewed with the patient as documented in the AVS. This note was prepared with assistance of Dragon voice recognition software. Occasional wrong-word or sound-a-like substitutions may have occurred due to the inherent limitations of voice recognition software

## 2018-08-03 NOTE — Patient Instructions (Signed)
Please return in 3 months for diabetes follow up  You are doing great!   If you have any questions or concerns, please don't hesitate to send me a message via MyChart or call the office at [phone]. Thank you for visiting with [place] today! It's our pleasure caring for you.

## 2018-08-28 ENCOUNTER — Telehealth: Payer: Self-pay | Admitting: Family Medicine

## 2018-08-28 ENCOUNTER — Other Ambulatory Visit: Payer: Self-pay | Admitting: *Deleted

## 2018-08-28 MED ORDER — AMOXICILLIN 500 MG PO CAPS: ORAL_CAPSULE | ORAL | 0 refills | Status: DC

## 2018-08-28 NOTE — Telephone Encounter (Signed)
[name] is calling from Dr. Purvis Kilts oral surgeon's office calling to report that the patient needs to have one tooth extracted. Wanting know how long the patient can be off of elaquis? Please advise Cb- [phone]

## 2018-08-28 NOTE — Telephone Encounter (Signed)
Spoke with Anda Kraft, she is aware per Dr. Jonni Sanger pt is to stop medication 3 days prior to procedure and restart 1-2 after depending on blood loss. She verbalized understanding

## 2018-09-05 DIAGNOSIS — R351 Nocturia: Secondary | ICD-10-CM | POA: Diagnosis not present

## 2018-09-05 DIAGNOSIS — N400 Enlarged prostate without lower urinary tract symptoms: Secondary | ICD-10-CM | POA: Diagnosis not present

## 2018-09-15 DIAGNOSIS — M48061 Spinal stenosis, lumbar region without neurogenic claudication: Secondary | ICD-10-CM | POA: Diagnosis not present

## 2018-09-16 ENCOUNTER — Other Ambulatory Visit: Payer: Self-pay | Admitting: Cardiology

## 2018-09-29 DIAGNOSIS — M48061 Spinal stenosis, lumbar region without neurogenic claudication: Secondary | ICD-10-CM | POA: Diagnosis not present

## 2018-09-29 DIAGNOSIS — M5136 Other intervertebral disc degeneration, lumbar region: Secondary | ICD-10-CM | POA: Diagnosis not present

## 2018-10-03 NOTE — Progress Notes (Signed)
Cardiology Office Note:    Date:  10/04/2018   ID:  [name], DOB [birth year], MRN [id number]  PCP:  [name], MD  Cardiologist:  Peter Martinique, MD   Referring MD: [name], MD   Chief Complaint  Patient presents with  . Leg Swelling    History of Present Illness:    [name] is a 85 y.o. male with a hx of chronic Afib who is rate controlled and anticoagulated with coumadin, CAD s/p CABG in 1990 with normal EF in 2013. Abnormal lexiscan 11/2014 prompted a repeat heart cath with occlused proximal LAD and critical stenosis of the RCA, occluded SVG-diagonal and patent LIMA-LAD. DES to mid RCA, continued on plavix. He had a stroke in 05/2016, imaging also showed 60% right carotid stenosis. INR was subtherapeutic, so he was switched to eliquis. Echo with normal LV and mild AS. He last saw Dr. Martinique in clinic on 07/17/18 and was doing well at that time.   He presents today for evaluation of lower extremity swelling. LE swelling started 1-2 weeks ago and he has gained 10 lbs in 1 week. He denies SOB. He sleeps in a recliner due to back and hip pain - due to get an injection tomorrow.    Past Medical History:  Diagnosis Date  . Abdominal pain   . Acute renal failure ([place])     resolved  . Arthritis    "hands & legs" (11/'10/2014)  . Ataxia   . Atrial fibrillation ([name])   . Bladder outlet obstruction   . Bladder outlet obstruction   . BPH (benign prostatic hyperplasia)   . CAD (coronary artery disease)    a. CABG IN 1989. b. 01/08/2015 CTO of ost LAD, LIMA to LAD not visualized but assumed patent given myoview finding, occluded SVG to diagonal, 99% mid RCA tx w/ SYNERGY DES 3X28 mm  . Constipation   . Diabetes mellitus type 2, insulin dependent ([place])   . Epistaxis, recurrent Feb 2017  . GERD (gastroesophageal reflux disease)   . HTN (hypertension)   . Hx of bacterial pneumonia   . Hyperlipemia   . Kidney stones   . Mild aortic stenosis   . Rib fractures    left rib fractures being treated with pain medications  . Stented coronary artery Nov 2016   RCA DES  . Urinary tract infection     Enterococcus    Past Surgical History:  Procedure Laterality Date  . CARDIAC CATHETERIZATION  1989  . CARDIAC CATHETERIZATION N/A 01/08/2015   Procedure: Left Heart Cath and Cors/Grafts Angiography;  Surgeon: Peter M Martinique, MD;  Location: [facility];  Service: Cardiovascular;  Laterality: N/A;  . CARDIAC CATHETERIZATION  01/08/2015   Procedure: Coronary Stent Intervention;  Surgeon: Peter M Martinique, MD;  Location: [facility];  Service: Cardiovascular;;  . CARPAL TUNNEL RELEASE Right 08/2010  . CATARACT EXTRACTION W/ INTRAOCULAR LENS  IMPLANT, BILATERAL Bilateral   . CORONARY ANGIOPLASTY  01/08/15   RCA DES  . CORONARY ARTERY BYPASS GRAFT  1989   "CABG X 2"  . JOINT REPLACEMENT    . TONSILLECTOMY    . TOTAL HIP ARTHROPLASTY Right 2000    Current Medications: Current Meds  Medication Sig  . amoxicillin (AMOXIL) 500 MG capsule Take 4 caps by mouth 1 hours prior to dental procedure  . apixaban (ELIQUIS) 5 MG TABS tablet Take 1 tablet (5 mg total) by mouth 2 (two) times daily.  [name] atorvastatin (LIPITOR)  40 MG tablet Take 1 tablet (40 mg total) by mouth daily at 6 PM.  . AVODART 0.5 MG capsule Take 0.5 mg by mouth every other day.   . diltiazem (CARDIZEM CD) 360 MG 24 hr capsule Take 1 capsule (360 mg total) by mouth daily.  Regino Schultze Bandages & Supports (KNEE BRACE ADJUSTABLE HINGED) MISC Wear daily as tolerated  . empagliflozin (JARDIANCE) 10 MG TABS tablet Take 10 mg by mouth daily.  [name] HYDROcodone-acetaminophen (NORCO/VICODIN) 5-325 MG tablet hydrocodone 5 mg-acetaminophen 325 mg tablet  Take 1 tablet 3 times a day by oral route as needed.  . insulin glargine (LANTUS) 100 UNIT/ML injection Inject 40 Units into the skin daily.  . meclizine (ANTIVERT) 25 MG tablet Take 1 tablet (25 mg total) by mouth 3 (three) times daily as needed for  dizziness.  . metFORMIN (GLUCOPHAGE) 500 MG tablet Take 500 mg by mouth 2 (two) times daily with a meal.  . metoprolol succinate (TOPROL-XL) 100 MG 24 hr tablet TAKE 1 TABLET BY MOUTH TWICE DAILY  . nitroGLYCERIN (NITROSTAT) 0.4 MG SL tablet Place 0.4 mg under the tongue every 5 (five) minutes as needed for chest pain (x 3 doses).  . pantoprazole (PROTONIX) 40 MG tablet TAKE 1 TABLET BY MOUTH DAILY AT NOON  . PRECISION XTRA TEST STRIPS test strip TEST BLOOD SUGAR 1-2 TIMES DAILY  . Tamsulosin HCl (FLOMAX) 0.4 MG CAPS Take 0.4 mg by mouth every other day.      Allergies:   Erythromycin base, Amoxicillin, Cefadroxil, Ciprofloxacin, and Erythromycin   Social History   Socioeconomic History  . Marital status: Married    Spouse name: Not on file  . Number of children: 3  . Years of education: Not on file  . Highest education level: Not on file  Occupational History  . Occupation: Agricultural consultant  Social Needs  . Financial resource strain: Not on file  . Food insecurity    Worry: Not on file    Inability: Not on file  . Transportation needs    Medical: Not on file    Non-medical: Not on file  Tobacco Use  . Smoking status: Former Smoker    Packs/day: 3.00    Years: 10.00    Pack years: 30.00    Types: Cigarettes    Quit date: 05/11/1958    Years since quitting: 60.4  . Smokeless tobacco: Never Used  Substance and Sexual Activity  . Alcohol use: No  . Drug use: No  . Sexual activity: Not Currently  Lifestyle  . Physical activity    Days per week: Not on file    Minutes per session: Not on file  . Stress: Not on file  Relationships  . Social Herbalist on phone: Not on file    Gets together: Not on file    Attends religious service: Not on file    Active member of club or organization: Not on file    Attends meetings of clubs or organizations: Not on file    Relationship status: Not on file  Other Topics Concern  . Not on file  Social History Narrative  .  Not on file     Family History: The patient's family history includes Brain cancer in his father; Throat cancer in his brother.  ROS:   Please see the history of present illness.     All other systems reviewed and are negative.  EKGs/Labs/Other Studies Reviewed:    The following studies were  reviewed today:  Echo 06/08/16: Study Conclusions - Left ventricle: The cavity size was normal. There was mild   concentric hypertrophy. Systolic function was vigorous. The   estimated ejection fraction was in the range of 65% to 70%. Wall   motion was normal; there were no regional wall motion   abnormalities. The study is not technically sufficient to allow   evaluation of LV diastolic function. - Aortic valve: Trileaflet; moderately thickened, moderately   calcified leaflets. Valve mobility was restricted. There was mild   stenosis. There was trivial regurgitation. Peak velocity (S): 254   cm/s. Mean gradient (S): 12 mm Hg. Valve area (VTI): 1.59 cm^2. - Mitral valve: Calcified annulus. Mildly thickened leaflets .   There was mild regurgitation. - Left atrium: The atrium was mildly dilated. - Tricuspid valve: There was mild regurgitation. - Pulmonary arteries: Systolic pressure was mildly increased. PA   peak pressure: 33 mm Hg (S).  Impressions: - Aortic stenosis has progressed since prior echocardiogram.   EKG:  EKG is  ordered today.  The ekg ordered today demonstrates atrial fibrillation with ventricular rate 89, TWI inferior leads, TWI V6 - stable from prior  Recent Labs: 03/24/2018: ALT 10; Hemoglobin 12.3; Platelets 262.0; TSH 1.92 04/24/2018: BUN 16; Creatinine, Ser 1.05; Potassium 4.3; Sodium 144  Recent Lipid Panel    Component Value Date/Time   CHOL 146 12/07/2017 0925   TRIG 78.0 12/07/2017 0925   HDL 44.70 12/07/2017 0925   CHOLHDL 3 12/07/2017 0925   VLDL 15.6 12/07/2017 0925   LDLCALC 85 12/07/2017 0925    Physical Exam:    VS:  BP (!) 141/83   Pulse 78    Temp (!) 97.3 F (36.3 C)   Ht 5\' 6"  (1.676 m)   Wt 210 lb (95.3 kg)   SpO2 93%   BMI 33.89 kg/m     Wt Readings from Last 3 Encounters:  10/04/18 210 lb (95.3 kg)  08/03/18 203 lb (92.1 kg)  07/17/18 196 lb (88.9 kg)     GEN: elderly male in no acute distress HEENT: Normal NECK: No JVD; No carotid bruits LYMPHATICS: No lymphadenopathy CARDIAC: RRR, soft systolic murmur, no rubs, gallops RESPIRATORY:  Clear to auscultation without rales, wheezing or rhonchi  ABDOMEN: Soft, non-tender, non-distended MUSCULOSKELETAL:  1+ B LE pitting edema; No deformity  SKIN: Warm and dry NEUROLOGIC:  Alert and oriented x 3 PSYCHIATRIC:  Normal affect   ASSESSMENT:    1. Swelling of lower extremity   2. Coronary artery disease of bypass graft of native heart with stable angina pectoris ([place])   3. CAD S/P PCI- Nov 2016   4. Chronic atrial fibrillation   5. Chronic anticoagulation   6. Bilateral carotid artery stenosis    PLAN:    In order of problems listed above:  Lower extremity swelling  Murmur on exam Previous echo with normal EF Could not evaluate diastolic function given Afib No SOB, but 10 lb weight gain, bilateral lower extremity swelling with skin color changes in his feet. No pain, no fever.  I will start 40 mg lasix with 20 mEq potassium. I instructed daily weights and low sodium diet. We will obtain a repeat echocardiogram given his CAD history. BMP and BNP today. Will repeat BMP in 1 week with close follow up next week.   Chronic atrial fibrillation On eliquis - no bleeding problems.   CAD s/p CABG, s/p DES to RCA 01/08/15 Stable, no CP. Continue BB. No ASA with eliquis.  Hypertension Pressure nearly controlled. No medication changes while titrating diuretic.    Carotid artery disease Repeat dopplers 12/08/17 with stable nonobstructive disease (1-39% bilaterally) No bruit on exam. No dizziness or syncope.    Follow up next week.   Medication  Adjustments/Labs and Tests Ordered: Current medicines are reviewed at length with the patient today.  Concerns regarding medicines are outlined above.  Orders Placed This Encounter  Procedures  . Basic metabolic panel  . Brain natriuretic peptide  . Basic metabolic panel  . EKG 12-Lead  . ECHOCARDIOGRAM COMPLETE   Meds ordered this encounter  Medications  . furosemide (LASIX) 40 MG tablet    Sig: Take 1 tablet (40 mg total) by mouth daily.    Dispense:  90 tablet    Refill:  1  . potassium chloride SA (K-DUR) 20 MEQ tablet    Sig: Take 1 tablet (20 mEq total) by mouth daily.    Dispense:  90 tablet    Refill:  1    Signed, Ledora Bottcher, [place]  10/04/2018 10:01 AM    [place]

## 2018-10-04 ENCOUNTER — Encounter: Payer: Self-pay | Admitting: Family Medicine

## 2018-10-04 ENCOUNTER — Encounter: Payer: Self-pay | Admitting: Physician Assistant

## 2018-10-04 ENCOUNTER — Ambulatory Visit (INDEPENDENT_AMBULATORY_CARE_PROVIDER_SITE_OTHER): Payer: Medicare Other | Admitting: Family Medicine

## 2018-10-04 ENCOUNTER — Ambulatory Visit (INDEPENDENT_AMBULATORY_CARE_PROVIDER_SITE_OTHER): Payer: Medicare Other | Admitting: Physician Assistant

## 2018-10-04 ENCOUNTER — Ambulatory Visit: Payer: Medicare Other | Admitting: Physician Assistant

## 2018-10-04 ENCOUNTER — Other Ambulatory Visit: Payer: Self-pay

## 2018-10-04 VITALS — BP 134/76 | HR 68 | Temp 98.3°F | Resp 16 | Ht 66.0 in | Wt 210.0 lb

## 2018-10-04 VITALS — BP 141/83 | HR 78 | Temp 97.3°F | Ht 66.0 in | Wt 210.0 lb

## 2018-10-04 DIAGNOSIS — E782 Mixed hyperlipidemia: Secondary | ICD-10-CM | POA: Diagnosis not present

## 2018-10-04 DIAGNOSIS — Z7901 Long term (current) use of anticoagulants: Secondary | ICD-10-CM | POA: Diagnosis not present

## 2018-10-04 DIAGNOSIS — I2510 Atherosclerotic heart disease of native coronary artery without angina pectoris: Secondary | ICD-10-CM | POA: Diagnosis not present

## 2018-10-04 DIAGNOSIS — I482 Chronic atrial fibrillation, unspecified: Secondary | ICD-10-CM

## 2018-10-04 DIAGNOSIS — Z9861 Coronary angioplasty status: Secondary | ICD-10-CM | POA: Diagnosis not present

## 2018-10-04 DIAGNOSIS — I6523 Occlusion and stenosis of bilateral carotid arteries: Secondary | ICD-10-CM

## 2018-10-04 DIAGNOSIS — I25708 Atherosclerosis of coronary artery bypass graft(s), unspecified, with other forms of angina pectoris: Secondary | ICD-10-CM | POA: Diagnosis not present

## 2018-10-04 DIAGNOSIS — T402X5A Adverse effect of other opioids, initial encounter: Secondary | ICD-10-CM

## 2018-10-04 DIAGNOSIS — M7989 Other specified soft tissue disorders: Secondary | ICD-10-CM

## 2018-10-04 DIAGNOSIS — Z794 Long term (current) use of insulin: Secondary | ICD-10-CM | POA: Diagnosis not present

## 2018-10-04 DIAGNOSIS — E119 Type 2 diabetes mellitus without complications: Secondary | ICD-10-CM | POA: Diagnosis not present

## 2018-10-04 DIAGNOSIS — M48062 Spinal stenosis, lumbar region with neurogenic claudication: Secondary | ICD-10-CM

## 2018-10-04 DIAGNOSIS — K5903 Drug induced constipation: Secondary | ICD-10-CM

## 2018-10-04 DIAGNOSIS — I5030 Unspecified diastolic (congestive) heart failure: Secondary | ICD-10-CM | POA: Diagnosis not present

## 2018-10-04 LAB — CBC WITH DIFFERENTIAL/PLATELET
Basophils Absolute: 0.2 10*3/uL — ABNORMAL HIGH (ref 0.0–0.1)
Basophils Relative: 1.9 % (ref 0.0–3.0)
Eosinophils Absolute: 0.4 10*3/uL (ref 0.0–0.7)
Eosinophils Relative: 4 % (ref 0.0–5.0)
HCT: 37.3 % — ABNORMAL LOW (ref 39.0–52.0)
Hemoglobin: 12 g/dL — ABNORMAL LOW (ref 13.0–17.0)
Lymphocytes Relative: 17 % (ref 12.0–46.0)
Lymphs Abs: 1.7 10*3/uL (ref 0.7–4.0)
MCHC: 32.3 g/dL (ref 30.0–36.0)
MCV: 84.3 fl (ref 78.0–100.0)
Monocytes Absolute: 0.9 10*3/uL (ref 0.1–1.0)
Monocytes Relative: 8.8 % (ref 3.0–12.0)
Neutro Abs: 6.9 10*3/uL (ref 1.4–7.7)
Neutrophils Relative %: 68.3 % (ref 43.0–77.0)
Platelets: 277 10*3/uL (ref 150.0–400.0)
RBC: 4.43 Mil/uL (ref 4.22–5.81)
RDW: 15.4 % (ref 11.5–15.5)
WBC: 10.1 10*3/uL (ref 4.0–10.5)

## 2018-10-04 LAB — COMPREHENSIVE METABOLIC PANEL
ALT: 8 U/L (ref 0–53)
AST: 11 U/L (ref 0–37)
Albumin: 4.1 g/dL (ref 3.5–5.2)
Alkaline Phosphatase: 68 U/L (ref 39–117)
BUN: 16 mg/dL (ref 6–23)
CO2: 27 mEq/L (ref 19–32)
Calcium: 9.2 mg/dL (ref 8.4–10.5)
Chloride: 103 mEq/L (ref 96–112)
Creatinine, Ser: 1.01 mg/dL (ref 0.40–1.50)
GFR: 69.65 mL/min (ref >60.00)
Glucose, Bld: 240 mg/dL — ABNORMAL HIGH (ref 70–99)
Potassium: 4.7 mEq/L (ref 3.5–5.1)
Sodium: 138 mEq/L (ref 135–145)
Total Bilirubin: 0.7 mg/dL (ref 0.2–1.2)
Total Protein: 6.4 g/dL (ref 6.0–8.3)

## 2018-10-04 LAB — POCT GLYCOSYLATED HEMOGLOBIN (HGB A1C): Hemoglobin A1C: 7.7 % — AB (ref 4.0–5.6)

## 2018-10-04 LAB — LIPID PANEL
Cholesterol: 143 mg/dL (ref 0–200)
HDL: 44.6 mg/dL (ref >39.00)
LDL Cholesterol: 77 mg/dL (ref 0–99)
NonHDL: 98.01
Total CHOL/HDL Ratio: 3
Triglycerides: 106 mg/dL (ref 0.0–149.0)
VLDL: 21.2 mg/dL (ref 0.0–40.0)

## 2018-10-04 LAB — TSH: TSH: 1.57 u[IU]/mL (ref 0.35–4.50)

## 2018-10-04 MED ORDER — POTASSIUM CHLORIDE CRYS ER 20 MEQ PO TBCR: 20.0000 meq | EXTENDED_RELEASE_TABLET | Freq: Every day | ORAL | 1 refills | Status: DC

## 2018-10-04 MED ORDER — FUROSEMIDE 40 MG PO TABS: 40.0000 mg | ORAL_TABLET | Freq: Every day | ORAL | 1 refills | Status: DC

## 2018-10-04 NOTE — Progress Notes (Signed)
Subjective  CC:  Chief Complaint  Patient presents with  . Back Pain  . Discuss Meds and Labs    He is needing to have labs done for the [place] since he is unable to travel there due to pain  . Constipation    Has not had a BM in 4 days    HPI: [name] is a 85 y.o. male who presents to the office today to address the problems listed above in the chief complaint.  Spinal stenosis and severe pain limiting sleep and ambulation managed by ortho; to have epidural steroid injection tomorrow. Has been successful with this therapy in the past. Has been on 2 weeks of narcotics w/o any relief; he is frusrated.  Constipation x 1-2 weeks, due to narcotics. Now straining w/o successful bm. No abdominal pain. New problem  [facility]: [facility] to be done here. DM control is improving.fastings are lower, now at goal. bp is controlled.   However, CHF workup ongoing. Seeing cards note from this am. Having increased leg edema and weight gain w/o sob. He did have left arm pain this am.     Immunization History  Administered Date(s) Administered  . Influenza Split 11/14/2007, 01/01/2009, 12/23/2009  . Influenza, High Dose Seasonal PF 11/02/2012, 12/12/2013, 11/15/2014, 11/24/2017  . Influenza, Seasonal, Injecte, Preservative Fre 11/18/2015  . Influenza,trivalent, recombinat, inj, PF 12/07/2010  . Influenza-Unspecified 10/28/2011, 11/30/2014, 02/15/2017  . Pneumococcal Conjugate-13 12/13/2013  . Pneumococcal-Unspecified 08/20/2008  . Td 05/29/2002  . Tdap 10/28/2011, 02/15/2017  . Zoster 08/20/2008  . Zoster Recombinat (Shingrix) 10/13/2016, 12/13/2016    Diabetes Related Lab Review: Lab Results  Component Value Date   HGBA1C 7.7 (A) 10/04/2018   HGBA1C 8.8 (A) 08/03/2018   HGBA1C 8.0 (A) 03/24/2018    Lab Results  Component Value Date   MICROALBUR 3.5 (H) 08/01/2017   Lab Results  Component Value Date   CREATININE 1.05 04/24/2018   BUN 16 04/24/2018   NA 144  04/24/2018   K 4.3 04/24/2018   CL 106 04/24/2018   CO2 30 04/24/2018   Lab Results  Component Value Date   CHOL 146 12/07/2017   CHOL 159 07/20/2017   CHOL 178 06/07/2016   Lab Results  Component Value Date   HDL 44.70 12/07/2017   HDL 42 07/20/2017   HDL 45 06/07/2016   Lab Results  Component Value Date   LDLCALC 85 12/07/2017   LDLCALC 89 07/20/2017   LDLCALC 61 01/11/2017   Lab Results  Component Value Date   TRIG 78.0 12/07/2017   TRIG 139 07/20/2017   TRIG 133 06/07/2016   Lab Results  Component Value Date   CHOLHDL 3 12/07/2017   CHOLHDL 4.0 06/07/2016   No results found for: LDLDIRECT The ASCVD Risk score Mikey Bussing DC Jr., et al., 2013) failed to calculate for the following reasons:   The 2013 ASCVD risk score is only valid for ages 78 to 42   The patient has a prior MI or stroke diagnosis  BP Readings from Last 3 Encounters:  10/04/18 134/76  10/04/18 (!) 141/83  08/03/18 135/79   Wt Readings from Last 3 Encounters:  10/04/18 210 lb (95.3 kg)  10/04/18 210 lb (95.3 kg)  08/03/18 203 lb (92.1 kg)    Health Maintenance  Topic Date Due  . OPHTHALMOLOGY EXAM  06/09/2018  . INFLUENZA VACCINE  09/30/2018  . HEMOGLOBIN A1C  02/02/2019  . FOOT EXAM  06/30/2019  . TETANUS/TDAP  02/16/2027  . PNA vac Low Risk Adult  Completed  . URINE MICROALBUMIN  Discontinued    Assessment  1. Spinal stenosis of lumbar region with neurogenic claudication   2. Diabetes mellitus type 2, insulin dependent (HCC)   3. Atherosclerosis of native coronary artery of native heart without angina pectoris   4. Mixed hyperlipidemia   5. Constipation due to opioid therapy      Plan   Spinal stenosis:  Per ortho. Hopeful that steroid injection will be benefitcial. Stop narcotics.   Constipation; increase miralax dosing and colace. Add dulcolax. No obstructive sxs now.   CHF: started lasix per cards. To have echo. Labs pending. No respiratory sxs  DM and HTN: control is now  at goal. Will get labs to [place].   No other med changes today.   Follow up: Return in about 4 weeks (around 11/01/2018) for follow up Diabetes, recheck.  11/08/2018  Orders Placed This Encounter  Procedures  . Comprehensive metabolic panel  . Lipid panel  . CBC with Differential/Platelet  . TSH  . POCT glycosylated hemoglobin (Hb A1C)   No orders of the defined types were placed in this encounter.     I reviewed the patients updated PMH, FH, and SocHx.    Patient Active Problem List   Diagnosis Date Noted  . Spinal stenosis of lumbar region 02/21/2018    Priority: High  . Diabetic peripheral neuropathy associated with type 2 diabetes mellitus ([place]) 05/05/2017    Priority: High  . Atherosclerotic heart disease of native coronary artery without angina pectoris 06/13/2016    Priority: High  . CVA (cerebral vascular accident) ([place]) 06/06/2016    Priority: High  . Recurrent coronary arteriosclerosis after percutaneous transluminal coronary angioplasty 04/18/2015    Priority: High  . CAD S/P PCI- Nov 2016     Priority: High  . Current use of long term anticoagulation 02/06/2013    Priority: High  . Chronic atrial fibrillation 07/30/2011    Priority: High  . Hx of CABG x 2 1990     Priority: High  . Essential (primary) hypertension     Priority: High  . Diabetes mellitus type 2, insulin dependent ([place])     Priority: High  . Hyperlipemia     Priority: High  . Degeneration of lumbar intervertebral disc 02/21/2018    Priority: Medium  . Chronic vertigo 03/15/2014    Priority: Medium  . Obesity (BMI 30.0-34.9) 01/31/2012    Priority: Medium  . Enlarged prostate without lower urinary tract symptoms (luts) 01/04/2011    Priority: Medium  . Gastro-esophageal reflux disease without esophagitis 12/07/2010    Priority: Medium  . Epistaxis, recurrent 04/11/2015    Priority: Low  . Osteoarthritis, knee 01/31/2012    Priority: Low  . Allergic rhinitis 10/28/2011    Priority: Low   . Hearing loss 06/08/2011    Priority: Low  . Primary osteoarthritis of hand 06/08/2011    Priority: Low  . Hematuria 08/27/2008    Priority: Low  . History of revision of total replacement of right hip joint 01/03/2018  . Leukocytosis 06/02/2016  . Carotid stenosis 06/02/2016   Current Meds  Medication Sig  . amoxicillin (AMOXIL) 500 MG capsule Take 4 caps by mouth 1 hours prior to dental procedure  . apixaban (ELIQUIS) 5 MG TABS tablet Take 1 tablet (5 mg total) by mouth 2 (two) times daily.  [name] atorvastatin (LIPITOR) 40 MG tablet Take 1 tablet (40 mg total) by mouth daily  at 6 PM.  . AVODART 0.5 MG capsule Take 0.5 mg by mouth every other day.   . diltiazem (CARDIZEM CD) 360 MG 24 hr capsule Take 1 capsule (360 mg total) by mouth daily.  Regino Schultze Bandages & Supports (KNEE BRACE ADJUSTABLE HINGED) MISC Wear daily as tolerated  . empagliflozin (JARDIANCE) 10 MG TABS tablet Take 10 mg by mouth daily.  . furosemide (LASIX) 40 MG tablet Take 1 tablet (40 mg total) by mouth daily.  . insulin glargine (LANTUS) 100 UNIT/ML injection Inject 40 Units into the skin daily.  . meclizine (ANTIVERT) 25 MG tablet Take 1 tablet (25 mg total) by mouth 3 (three) times daily as needed for dizziness.  . metFORMIN (GLUCOPHAGE) 500 MG tablet Take 500 mg by mouth 2 (two) times daily with a meal.  . metoprolol succinate (TOPROL-XL) 100 MG 24 hr tablet TAKE 1 TABLET BY MOUTH TWICE DAILY  . nitroGLYCERIN (NITROSTAT) 0.4 MG SL tablet Place 0.4 mg under the tongue every 5 (five) minutes as needed for chest pain (x 3 doses).  . pantoprazole (PROTONIX) 40 MG tablet TAKE 1 TABLET BY MOUTH DAILY AT NOON  . potassium chloride SA (K-DUR) 20 MEQ tablet Take 1 tablet (20 mEq total) by mouth daily.  [name] PRECISION XTRA TEST STRIPS test strip TEST BLOOD SUGAR 1-2 TIMES DAILY  . Tamsulosin HCl (FLOMAX) 0.4 MG CAPS Take 0.4 mg by mouth every other day.   . [DISCONTINUED] HYDROcodone-acetaminophen (NORCO/VICODIN) 5-325 MG  tablet hydrocodone 5 mg-acetaminophen 325 mg tablet  Take 1 tablet 3 times a day by oral route as needed.    Allergies: Patient is allergic to erythromycin base; amoxicillin; cefadroxil; ciprofloxacin; and erythromycin. Family History: Patient family history includes Brain cancer in his father; Throat cancer in his brother. Social History:  Patient  reports that he quit smoking about 60 years ago. His smoking use included cigarettes. He has a 30.00 pack-year smoking history. He has never used smokeless tobacco. He reports that he does not drink alcohol or use drugs.  Review of Systems: Constitutional: Negative for fever malaise or anorexia Cardiovascular: negative for chest pain Respiratory: negative for SOB or persistent cough Gastrointestinal: negative for abdominal pain  Objective  Vitals: BP 134/76   Pulse 68   Temp 98.3 F (36.8 C) (Tympanic)   Resp 16   Ht 5\' 6"  (1.676 m)   Wt 210 lb (95.3 kg)   SpO2 96%   BMI 33.89 kg/m  General: no acute distress , A&Ox3 HEENT: PEERL, conjunctiva normal, Oropharynx moist,neck is supple Cardiovascular:  irreg irreg with murmur  Respiratory:  Good breath sounds bilaterally, CTAB with normal respiratory effort Skin:  Warm, no rashes Gastrointestinal: soft, flat abdomen, normal active bowel sounds, no palpable masses, no hepatosplenomegaly, no appreciated hernias     Commons side effects, risks, benefits, and alternatives for medications and treatment plan prescribed today were discussed, and the patient expressed understanding of the given instructions. Patient is instructed to call or message via MyChart if he/she has any questions or concerns regarding our treatment plan. No barriers to understanding were identified. We discussed Red Flag symptoms and signs in detail. Patient expressed understanding regarding what to do in case of urgent or emergency type symptoms.   Medication list was reconciled, printed and provided to the patient in  AVS. Patient instructions and summary information was reviewed with the patient as documented in the AVS. This note was prepared with assistance of Dragon voice recognition software. Occasional wrong-word or sound-a-like substitutions  may have occurred due to the inherent limitations of voice recognition software

## 2018-10-04 NOTE — Progress Notes (Signed)
Please call patient: I have reviewed his/her lab results. Labs are stable. Diabetes is better. No med changes needed at this time.  Send to [place]. Thanks.

## 2018-10-04 NOTE — Patient Instructions (Signed)
Medication Instructions:  START Furosemide 40 mg daily.  START Potassium 20 mEq daily.  If you need a refill on your cardiac medications before your next appointment, please call your pharmacy.   Lab work: Your physician recommends that you return for lab work today: BMET, BNP  Your physician recommends that you return for lab work when you come for your follow-up appointment next week: BMET  If you have labs (blood work) drawn today and your tests are completely normal, you will receive your results only by: [name] MyChart Message (if you have MyChart) OR . A paper copy in the mail If you have any lab test that is abnormal or we need to change your treatment, we will call you to review the results.  Testing/Procedures: Your physician has requested that you have an echocardiogram (ASAP--before follow-up appointment). Echocardiography is a painless test that uses sound waves to create images of your heart. It provides your doctor with information about the size and shape of your heart and how well your heart's chambers and valves are working. This procedure takes approximately one hour. There are no restrictions for this procedure.  Follow-Up: At [facility], you and your health needs are our priority.  As part of our continuing mission to provide you with exceptional heart care, we have created designated Provider Care Teams.  These Care Teams include your primary Cardiologist (physician) and Advanced Practice Providers (APPs -  Physician Assistants and Nurse Practitioners) who all work together to provide you with the care you need, when you need it. . You have been scheduled for a follow-up in office visit next week, Wednesday 10/11/18 at 10:15 AM.  Any Other Special Instructions Will Be Listed Below (If Applicable). None

## 2018-10-04 NOTE — Patient Instructions (Signed)
Please return in September for recheck.   Stop taking the pain pills.  Consider starting gabapentin for your back pain if the shot is not helpful.  Start miralax one capful twice a day.  Take colace 100mg  twice a day.  Add dulcolax daily in 2-3 days if not improving.   We will get your labwork to the [place].  If you have any questions or concerns, please don't hesitate to send me a message via MyChart or call the office at [phone]. Thank you for visiting with [name] today! It's our pleasure caring for you.   Constipation, Adult Constipation is when a person has fewer bowel movements in a week than normal, has difficulty having a bowel movement, or has stools that are dry, hard, or larger than normal. Constipation may be caused by an underlying condition. It may become worse with age if a person takes certain medicines and does not take in enough fluids. Follow these instructions at home: Eating and drinking   Eat foods that have a lot of fiber, such as fresh fruits and vegetables, whole grains, and beans.  Limit foods that are high in fat, low in fiber, or overly processed, such as french fries, hamburgers, cookies, candies, and soda.  Drink enough fluid to keep your urine clear or pale yellow. General instructions  Exercise regularly or as told by your health care provider.  Go to the restroom when you have the urge to go. Do not hold it in.  Take over-the-counter and prescription medicines only as told by your health care provider. These include any fiber supplements.  Practice pelvic floor retraining exercises, such as deep breathing while relaxing the lower abdomen and pelvic floor relaxation during bowel movements.  Watch your condition for any changes.  Keep all follow-up visits as told by your health care provider. This is important. Contact a health care provider if:  You have pain that gets worse.  You have a fever.  You do not have a bowel movement after 4 days.   You vomit.  You are not hungry.  You lose weight.  You are bleeding from the anus.  You have thin, pencil-like stools. Get help right away if:  You have a fever and your symptoms suddenly get worse.  You leak stool or have blood in your stool.  Your abdomen is bloated.  You have severe pain in your abdomen.  You feel dizzy or you faint. This information is not intended to replace advice given to you by your health care provider. Make sure you discuss any questions you have with your health care provider. Document Released: 11/14/2003 Document Revised: 01/28/2017 Document Reviewed: 08/06/2015 Elsevier Patient Education  2020 [employer].

## 2018-10-05 ENCOUNTER — Encounter: Payer: Self-pay | Admitting: *Deleted

## 2018-10-05 DIAGNOSIS — M5136 Other intervertebral disc degeneration, lumbar region: Secondary | ICD-10-CM | POA: Diagnosis not present

## 2018-10-05 DIAGNOSIS — M48061 Spinal stenosis, lumbar region without neurogenic claudication: Secondary | ICD-10-CM | POA: Diagnosis not present

## 2018-10-05 LAB — BASIC METABOLIC PANEL
BUN/Creatinine Ratio: 15 (ref 10–24)
BUN: 15 mg/dL (ref 8–27)
CO2: 24 mmol/L (ref 20–29)
Calcium: 9.4 mg/dL (ref 8.6–10.2)
Chloride: 104 mmol/L (ref 96–106)
Creatinine, Ser: 1.01 mg/dL (ref 0.76–1.27)
GFR calc Af Amer: 76 mL/min/{1.73_m2} (ref >59)
GFR calc non Af Amer: 66 mL/min/{1.73_m2} (ref >59)
Glucose: 109 mg/dL — ABNORMAL HIGH (ref 65–99)
Potassium: 5.1 mmol/L (ref 3.5–5.2)
Sodium: 142 mmol/L (ref 134–144)

## 2018-10-05 LAB — BRAIN NATRIURETIC PEPTIDE: BNP: 261.1 pg/mL — ABNORMAL HIGH (ref 0.0–100.0)

## 2018-10-09 ENCOUNTER — Other Ambulatory Visit: Payer: Self-pay | Admitting: Physician Assistant

## 2018-10-10 ENCOUNTER — Ambulatory Visit (HOSPITAL_COMMUNITY): Payer: Medicare Other | Attending: Cardiology

## 2018-10-10 ENCOUNTER — Other Ambulatory Visit: Payer: Self-pay

## 2018-10-10 DIAGNOSIS — Z9861 Coronary angioplasty status: Secondary | ICD-10-CM | POA: Diagnosis not present

## 2018-10-10 DIAGNOSIS — I25708 Atherosclerosis of coronary artery bypass graft(s), unspecified, with other forms of angina pectoris: Secondary | ICD-10-CM | POA: Diagnosis not present

## 2018-10-10 DIAGNOSIS — I482 Chronic atrial fibrillation, unspecified: Secondary | ICD-10-CM | POA: Diagnosis not present

## 2018-10-10 DIAGNOSIS — I2510 Atherosclerotic heart disease of native coronary artery without angina pectoris: Secondary | ICD-10-CM | POA: Diagnosis not present

## 2018-10-10 DIAGNOSIS — M7989 Other specified soft tissue disorders: Secondary | ICD-10-CM | POA: Diagnosis not present

## 2018-10-11 ENCOUNTER — Ambulatory Visit: Payer: Medicare Other | Admitting: Adult Health

## 2018-10-11 DIAGNOSIS — H35041 Retinal micro-aneurysms, unspecified, right eye: Secondary | ICD-10-CM | POA: Diagnosis not present

## 2018-10-11 DIAGNOSIS — H35351 Cystoid macular degeneration, right eye: Secondary | ICD-10-CM | POA: Diagnosis not present

## 2018-10-11 DIAGNOSIS — H348312 Tributary (branch) retinal vein occlusion, right eye, stable: Secondary | ICD-10-CM | POA: Diagnosis not present

## 2018-10-11 DIAGNOSIS — E119 Type 2 diabetes mellitus without complications: Secondary | ICD-10-CM | POA: Diagnosis not present

## 2018-10-11 LAB — HM DIABETES EYE EXAM

## 2018-10-13 ENCOUNTER — Encounter: Payer: Self-pay | Admitting: Podiatry

## 2018-10-13 ENCOUNTER — Other Ambulatory Visit: Payer: Self-pay

## 2018-10-13 ENCOUNTER — Ambulatory Visit (INDEPENDENT_AMBULATORY_CARE_PROVIDER_SITE_OTHER): Payer: Medicare Other | Admitting: Podiatry

## 2018-10-13 VITALS — Temp 96.6°F

## 2018-10-13 DIAGNOSIS — Z7901 Long term (current) use of anticoagulants: Secondary | ICD-10-CM | POA: Diagnosis not present

## 2018-10-13 DIAGNOSIS — B351 Tinea unguium: Secondary | ICD-10-CM | POA: Diagnosis not present

## 2018-10-13 DIAGNOSIS — M79674 Pain in right toe(s): Secondary | ICD-10-CM | POA: Diagnosis not present

## 2018-10-13 DIAGNOSIS — E119 Type 2 diabetes mellitus without complications: Secondary | ICD-10-CM | POA: Diagnosis not present

## 2018-10-13 DIAGNOSIS — D689 Coagulation defect, unspecified: Secondary | ICD-10-CM

## 2018-10-13 DIAGNOSIS — I2510 Atherosclerotic heart disease of native coronary artery without angina pectoris: Secondary | ICD-10-CM | POA: Diagnosis not present

## 2018-10-13 DIAGNOSIS — Z9861 Coronary angioplasty status: Secondary | ICD-10-CM | POA: Diagnosis not present

## 2018-10-13 DIAGNOSIS — I25708 Atherosclerosis of coronary artery bypass graft(s), unspecified, with other forms of angina pectoris: Secondary | ICD-10-CM | POA: Diagnosis not present

## 2018-10-13 DIAGNOSIS — M7989 Other specified soft tissue disorders: Secondary | ICD-10-CM | POA: Diagnosis not present

## 2018-10-13 DIAGNOSIS — I482 Chronic atrial fibrillation, unspecified: Secondary | ICD-10-CM | POA: Diagnosis not present

## 2018-10-13 LAB — BASIC METABOLIC PANEL
BUN/Creatinine Ratio: 15 (ref 10–24)
BUN: 18 mg/dL (ref 8–27)
CO2: 24 mmol/L (ref 20–29)
Calcium: 9.3 mg/dL (ref 8.6–10.2)
Chloride: 101 mmol/L (ref 96–106)
Creatinine, Ser: 1.21 mg/dL (ref 0.76–1.27)
GFR calc Af Amer: 61 mL/min/{1.73_m2} (ref >59)
GFR calc non Af Amer: 53 mL/min/{1.73_m2} — ABNORMAL LOW (ref >59)
Glucose: 184 mg/dL — ABNORMAL HIGH (ref 65–99)
Potassium: 5.2 mmol/L (ref 3.5–5.2)
Sodium: 141 mmol/L (ref 134–144)

## 2018-10-13 MED ORDER — GABAPENTIN 100 MG PO CAPS: 100.0000 mg | ORAL_CAPSULE | Freq: Three times a day (TID) | ORAL | 0 refills | Status: DC

## 2018-10-13 NOTE — Progress Notes (Signed)
Patient ID: [name], male   DOB: 1929/01/28, 85 y.o.   MRN: [id number] Complaint:  Visit Type: Patient returns to my office for continued preventative foot care services. Complaint: Patient states" my nails have grown long and thick and become painful to walk and wear shoes" Patient has been diagnosed with DM with no foot  complications. He presents for preventative foot care services. No changes to ROS.  Patient is on eliquiss.  Podiatric Exam: Vascular: dorsalis pedis and posterior tibial pulses are palpable bilateral. Capillary return is immediate. Temperature gradient is WNL. Skin turgor WNL  Sensorium: Normal Semmes Weinstein monofilament test. Normal tactile sensation bilaterally. Nail Exam: Pt has thick disfigured discolored nails with subungual debris noted bilateral entire nail hallux through fifth toenails Ulcer Exam: There is no evidence of ulcer or pre-ulcerative changes or infection. Orthopedic Exam: Muscle tone and strength are WNL. No limitations in general ROM. No crepitus or effusions noted. Foot type and digits show no abnormalities. Bony prominences are unremarkable. Skin: No Porokeratosis. No infection or ulcers  Diagnosis:  Tinea unguium, Pain in right toe, pain in left toes  Treatment & Plan Procedures and Treatment: Consent by patient was obtained for treatment procedures. The patient understood the discussion of treatment and procedures well. All questions were answered thoroughly reviewed. Debridement of mycotic and hypertrophic toenails, 1 through 5 bilateral and clearing of subungual debris. No ulceration, no infection noted.  Patient given gabapentin for this weekend only.  He is to contact his medical doctor for his neuropathy on Monday. Return Visit-Office Procedure: Patient instructed to return to the office for a follow up visit 10 weeks  for continued evaluation and treatment.    Gardiner Barefoot DPM

## 2018-10-13 NOTE — Addendum Note (Signed)
Addended byDeidre Ala, Rudolph Dobler L on: 10/13/2018 11:18 AM   Modules accepted: Orders

## 2018-10-15 NOTE — Progress Notes (Signed)
Cardiology Office Note   Date:  10/16/2018   ID:  [name], DOB February 01, 1929, MRN [id number]  PCP:  Jeffrey Arnt, MD  Cardiologist: Dr. Martinique CC: Follow  Up    History of Present Illness: [name] is a 85 y.o. male who presents for ongoing assessment and management of chronic atrial fibrillation on Eliquis, coronary artery disease status post CABG in 1990, repeat heart catheterization 2006 revealing occluded proximal LAD and critical stenosis of the RCA, occluded SVG-diagonal, and patent LIMA to LAD.  DES to the mid RCA.  The patient was continued on Plavix.  Other history includes CVA in April 2018 with imaging revealing 60% right carotid stenosis.  INR was subtherapeutic so the patient was switched to Eliquis from Coumadin.  The patient was last seen in the office on 10/04/2018 by Jeffrey Sharp, PA for complaints of lower extremity edema which started 1 to 2 weeks prior to being seen with a weight gain of 10 pounds.  He was noted to have chronic hip pain receiving injections to his hip, and sleeps in a recliner due to back and hip pain.  On that office visit the patient was started on Lasix 40 mg with potassium 20 mEq.  He was advised to do daily weights and a low-sodium diet.  Echocardiogram was free.  Due to his history of CAD along with repeat BMET prior to this office visit.    Since being seen last the patient was seen by podiatrist, Dr. Prudence Davidson, on 10/13/2018.  His main complaint was thick nails causing him to have painful ambulation and unable to wear shoes properly.  The patient had bilateral debridement of mycotic and hypertrophic toenails.  There was no mention of edema on the foot exam.  Labs revealed creatinine of 1.21 on 10/13/2018 with elevated glucose of 184 (test not done fasting), potassium 5.2.  The patient was called with lab results and reported that his swelling had completely resolved.  Echocardiogram completed on 10/10/2018 revealed normal LV systolic function of  55 to 60% but could not evaluate end-diastolic pressure due to atrial fib.  He was noted to have a tricuspid aortic valve with severe thickening of the aortic valve with severe calcification of the aortic valve.  Aortic regurg was trivial, moderate stenosis of the aortic valve with moderate aortic annular calcification.  A V-max 232.4 cm, AV mean gradient 13.0 mmHg, the AV appear to be at least moderately stenosed.  He was also found to have mild to moderate mitral annular calcification with no evidence of stenosis.  He comes today feeling great. He has lost 11 lbs of fluid, is walking without a cane, no pain in his legs, no bleeding or bruising, the edema is completely resolved. He just took his medications before coming in today.   Past Medical History:  Diagnosis Date  . Abdominal pain   . Acute renal failure ([place])     resolved  . Arthritis    "hands & legs" (11/'10/2014)  . Ataxia   . Atrial fibrillation ([place])   . Bladder outlet obstruction   . Bladder outlet obstruction   . BPH (benign prostatic hyperplasia)   . CAD (coronary artery disease)    a. CABG IN 1989. b. 01/08/2015 CTO of ost LAD, LIMA to LAD not visualized but assumed patent given myoview finding, occluded SVG to diagonal, 99% mid RCA tx w/ SYNERGY DES 3X28 mm  . Constipation   . Diabetes mellitus type 2, insulin dependent ([place])   .  Epistaxis, recurrent Feb 2017  . GERD (gastroesophageal reflux disease)   . HTN (hypertension)   . Hx of bacterial pneumonia   . Hyperlipemia   . Kidney stones   . Mild aortic stenosis   . Rib fractures    left rib fractures being treated with pain medications  . Stented coronary artery Nov 2016   RCA DES  . Urinary tract infection     Enterococcus    Past Surgical History:  Procedure Laterality Date  . CARDIAC CATHETERIZATION  1989  . CARDIAC CATHETERIZATION N/A 01/08/2015   Procedure: Left Heart Cath and Cors/Grafts Angiography;  Surgeon: Jeffrey M Martinique, MD;  Location: [facility] [place] [facility];  Service: Cardiovascular;  Laterality: N/A;  . CARDIAC CATHETERIZATION  01/08/2015   Procedure: Coronary Stent Intervention;  Surgeon: Jeffrey M Martinique, MD;  Location: [facility];  Service: Cardiovascular;;  . CARPAL TUNNEL RELEASE Right 08/2010  . CATARACT EXTRACTION W/ INTRAOCULAR LENS  IMPLANT, BILATERAL Bilateral   . CORONARY ANGIOPLASTY  01/08/15   RCA DES  . CORONARY ARTERY BYPASS GRAFT  1989   "CABG X 2"  . JOINT REPLACEMENT    . TONSILLECTOMY    . TOTAL HIP ARTHROPLASTY Right 2000     Current Outpatient Medications  Medication Sig Dispense Refill  . amoxicillin (AMOXIL) 500 MG capsule Take 4 caps by mouth 1 hours prior to dental procedure 8 capsule 0  . apixaban (ELIQUIS) 5 MG TABS tablet Take 1 tablet (5 mg total) by mouth 2 (two) times daily. 60 tablet 2  . atorvastatin (LIPITOR) 40 MG tablet Take 1 tablet (40 mg total) by mouth daily at 6 PM. 30 tablet 2  . AVODART 0.5 MG capsule Take 0.5 mg by mouth every other day.     . diltiazem (CARDIZEM CD) 360 MG 24 hr capsule Take 1 capsule (360 mg total) by mouth daily. 30 capsule 6  . Elastic Bandages & Supports (KNEE BRACE ADJUSTABLE HINGED) MISC Wear daily as tolerated 1 each 0  . empagliflozin (JARDIANCE) 10 MG TABS tablet Take 10 mg by mouth daily.    . furosemide (LASIX) 40 MG tablet Take 1 tablet (40 mg total) by mouth daily. 90 tablet 1  . gabapentin (NEURONTIN) 100 MG capsule Take 1 capsule (100 mg total) by mouth 3 (three) times daily. 10 capsule 0  . insulin glargine (LANTUS) 100 UNIT/ML injection Inject 40 Units into the skin daily.    . meclizine (ANTIVERT) 25 MG tablet Take 1 tablet (25 mg total) by mouth 3 (three) times daily as needed for dizziness. 30 tablet 1  . metFORMIN (GLUCOPHAGE) 500 MG tablet Take 500 mg by mouth 2 (two) times daily with a meal.    . metoprolol succinate (TOPROL-XL) 100 MG 24 hr tablet TAKE 1 TABLET BY MOUTH TWICE DAILY 180 tablet 3  . nitroGLYCERIN (NITROSTAT) 0.4 MG SL tablet Place  0.4 mg under the tongue every 5 (five) minutes as needed for chest pain (x 3 doses).    . pantoprazole (PROTONIX) 40 MG tablet TAKE 1 TABLET BY MOUTH DAILY AT NOON 90 tablet 2  . potassium chloride SA (K-DUR) 20 MEQ tablet potassium chloride ER 20 mEq tablet,extended release(part/cryst)    . PRECISION XTRA TEST STRIPS test strip TEST BLOOD SUGAR 1-2 TIMES DAILY 200 each 3  . Tamsulosin HCl (FLOMAX) 0.4 MG CAPS Take 0.4 mg by mouth every other day.      No current facility-administered medications for this visit.  Allergies:   Erythromycin base, Amoxicillin, Cefadroxil, Ciprofloxacin, and Erythromycin    Social History:  The patient  reports that he quit smoking about 60 years ago. His smoking use included cigarettes. He has a 30.00 pack-year smoking history. He has never used smokeless tobacco. He reports that he does not drink alcohol or use drugs.   Family History:  The patient's family history includes Brain cancer in his father; Throat cancer in his brother.    ROS: All other systems are reviewed and negative. Unless otherwise mentioned in H&P    PHYSICAL EXAM: VS:  BP 138/74   Pulse (!) 110   Ht 5\' 6"  (1.676 m)   Wt 198 lb (89.8 kg)   SpO2 98%   BMI 31.96 kg/m  , BMI Body mass index is 31.96 kg/m. GEN: Well nourished, well developed, in no acute distress HEENT: normal Neck: no JVD, carotid bruits, or masses Cardiac: IRRR; tachycardic, 2/6 holostystolic murmur, no, rubs, or gallops,no edema  Respiratory:  Clear to auscultation bilaterally, normal work of breathing GI: soft, nontender, nondistended, + BS MS: no deformity or atrophy Skin: warm and dry, no rash Neuro:  Strength and sensation are intact Psych: euthymic mood, full affect   EKG: Not completed this office visit.   Recent Labs: 10/04/2018: ALT 8; BNP 261.1; Hemoglobin 12.0; Platelets 277.0; TSH 1.57 10/13/2018: BUN 18; Creatinine, Ser 1.21; Potassium 5.2; Sodium 141    Lipid Panel    Component Value  Date/Time   CHOL 143 10/04/2018 1450   TRIG 106.0 10/04/2018 1450   HDL 44.60 10/04/2018 1450   CHOLHDL 3 10/04/2018 1450   VLDL 21.2 10/04/2018 1450   LDLCALC 77 10/04/2018 1450      Wt Readings from Last 3 Encounters:  10/16/18 198 lb (89.8 kg)  10/04/18 210 lb (95.3 kg)  10/04/18 210 lb (95.3 kg)      Other studies Reviewed: Echocardiogram 2018/11/03 1. The left ventricle has normal systolic function, with an ejection fraction of 55-60%. The cavity size was normal. Left ventricular diastolic function could not be evaluated secondary to atrial fibrillation. Elevated left ventricular end-diastolic  pressure No evidence of left ventricular regional wall motion abnormalities.  2. The right ventricle has normal systolic function. The cavity was mildly enlarged. There is no increase in right ventricular wall thickness.  3. Left atrial size was severely dilated.  4. The aortic valve is tricuspid. Severely thickening of the aortic valve. Severe calcifcation of the aortic valve. Aortic valve regurgitation is trivial by color flow Doppler. Moderate stenosis of the aortic valve. Moderate aortic annular calcification  noted. AV Vmax: 232.40 cm and AV Mean Grad: 13.0 mmHg are consistent with mild AS but visually the AV appears to be at least moderately stenosis.  5. There is mild to moderate mitral annular calcification present. No evidence of mitral valve stenosis  6. There is mild dilatation of the ascending aorta measuring 43 mm.  ASSESSMENT AND PLAN:  1. Diastolic CHF: He has had complete resolution of LEE on lasix 40 mg daily. He is now only to use this prn. If he gains weight or notices edema. He verbalizes understanding. He has been medically compliant. Will follow in 6 months.   2. AoV stenosis: Harsh murmur is found on ausculation. Per echo he has moderate stenosis. This will be followed. If he has significant dyspnea or chest pain he is to report this. Will need to have annual echo.   3. Persistent Atrial fib: Heart rate is up today  compared to previous visit. He just took is medication prior to coming in to include metoprolol and diltiazem.  Will need to follow HR rate. He is not aware of his irregular heart rate. Continue Eliquis. No signs of dehydration on exam. Creatinine 1.21, two weeks ago.   4. CAD: CABG by hx with repeat cath in 2016 with occluded SVG to diagonal and DES placement to RCA in the setting of 99% stenosis. He offers no complaints of chest pain. His energy level is better having had diureses.   5. Diabetes; Followed by PCP. On Jardiance as well. Has seen podiatrist for foot care.   Current medicines are reviewed at length with the patient today.    Labs/ tests ordered today include: None  Phill Myron. West Pugh, ANP, [facility]   10/16/2018 10:37 AM    [facility] [address] Office [phone] Fax [phone]

## 2018-10-16 ENCOUNTER — Ambulatory Visit (INDEPENDENT_AMBULATORY_CARE_PROVIDER_SITE_OTHER): Payer: Medicare Other | Admitting: Adult Health

## 2018-10-16 ENCOUNTER — Telehealth: Payer: Self-pay

## 2018-10-16 ENCOUNTER — Encounter: Payer: Self-pay | Admitting: Adult Health

## 2018-10-16 ENCOUNTER — Other Ambulatory Visit: Payer: Self-pay

## 2018-10-16 VITALS — BP 138/74 | HR 110 | Ht 66.0 in | Wt 198.0 lb

## 2018-10-16 DIAGNOSIS — I350 Nonrheumatic aortic (valve) stenosis: Secondary | ICD-10-CM

## 2018-10-16 DIAGNOSIS — I4819 Other persistent atrial fibrillation: Secondary | ICD-10-CM

## 2018-10-16 DIAGNOSIS — I6523 Occlusion and stenosis of bilateral carotid arteries: Secondary | ICD-10-CM | POA: Diagnosis not present

## 2018-10-16 DIAGNOSIS — E7800 Pure hypercholesterolemia, unspecified: Secondary | ICD-10-CM

## 2018-10-16 DIAGNOSIS — I2510 Atherosclerotic heart disease of native coronary artery without angina pectoris: Secondary | ICD-10-CM | POA: Diagnosis not present

## 2018-10-16 DIAGNOSIS — I5032 Chronic diastolic (congestive) heart failure: Secondary | ICD-10-CM | POA: Diagnosis not present

## 2018-10-16 MED ORDER — GABAPENTIN 100 MG PO CAPS: 100.0000 mg | ORAL_CAPSULE | Freq: Three times a day (TID) | ORAL | 5 refills | Status: DC

## 2018-10-16 NOTE — Telephone Encounter (Signed)
error 

## 2018-10-16 NOTE — Patient Instructions (Signed)
Medication Instructions:  Continue current medications  If you need a refill on your cardiac medications before your next appointment, please call your pharmacy.  Labwork: None Ordered   Testing/Procedures: None Ordered  Follow-Up: You will need a follow up appointment in 6 months.  Please call our office 2 months in advance to schedule this appointment.  You may see Peter Martinique, MD or one of the following Advanced Practice Providers on your designated Care Team: [place], [place] . Fabian Sharp, [name]     At [facility], you and your health needs are our priority.  As part of our continuing mission to provide you with exceptional heart care, we have created designated Provider Care Teams.  These Care Teams include your primary Cardiologist (physician) and Advanced Practice Providers (APPs -  Physician Assistants and Nurse Practitioners) who all work together to provide you with the care you need, when you need it.  Thank you for choosing [facility] at [facility]!!

## 2018-10-16 NOTE — Addendum Note (Signed)
Addended by: Billey Chang on: 10/16/2018 10:47 AM   Modules accepted: Orders

## 2018-10-17 ENCOUNTER — Encounter: Payer: Self-pay | Admitting: Family Medicine

## 2018-10-17 ENCOUNTER — Ambulatory Visit (INDEPENDENT_AMBULATORY_CARE_PROVIDER_SITE_OTHER): Payer: Medicare Other | Admitting: Family Medicine

## 2018-10-17 VITALS — BP 128/78 | HR 100 | Temp 98.2°F | Resp 16 | Ht 66.0 in | Wt 198.2 lb

## 2018-10-17 DIAGNOSIS — E1142 Type 2 diabetes mellitus with diabetic polyneuropathy: Secondary | ICD-10-CM | POA: Diagnosis not present

## 2018-10-17 DIAGNOSIS — M48062 Spinal stenosis, lumbar region with neurogenic claudication: Secondary | ICD-10-CM

## 2018-10-17 NOTE — Progress Notes (Signed)
Subjective  CC:  Chief Complaint  Patient presents with  . Spinal stenosis of lumbar region with neurogenic claudicatio    Reports that Gabapentin is working well, he is able to walk without cane, and is able to sleep during the night in his bed    HPI: [name] is a 85 y.o. male who presents to the office today to address the problems listed above in the chief complaint.  See last note: severe back pain unrelieved with epidural steroid injections and narcotics. Started low dose gabapentin and now pain free! Doing well. Has also helped peripheral neuropathy numbness. He is happy. No more constipation due to not needing narcotics anymore. Able to sleep comfortably in his own bed again.   Assessment  1. Spinal stenosis of lumbar region with neurogenic claudication   2. Diabetic peripheral neuropathy associated with type 2 diabetes mellitus ([place])      Plan   Spinal stenosis:  Responsive to gabapentin. Monitor course and titrate up dose if needed.   Follow up: f/u as scheduled.  11/02/2018  No orders of the defined types were placed in this encounter.  No orders of the defined types were placed in this encounter.     I reviewed the patients updated PMH, FH, and SocHx.    Patient Active Problem List   Diagnosis Date Noted  . Spinal stenosis of lumbar region 02/21/2018    Priority: High  . Diabetic peripheral neuropathy associated with type 2 diabetes mellitus ([place]) 05/05/2017    Priority: High  . Atherosclerotic heart disease of native coronary artery without angina pectoris 06/13/2016    Priority: High  . CVA (cerebral vascular accident) ([place]) 06/06/2016    Priority: High  . Recurrent coronary arteriosclerosis after percutaneous transluminal coronary angioplasty 04/18/2015    Priority: High  . CAD S/P PCI- Nov 2016     Priority: High  . Current use of long term anticoagulation 02/06/2013    Priority: High  . Chronic atrial fibrillation 07/30/2011    Priority: High   . Hx of CABG x 2 1990     Priority: High  . Essential (primary) hypertension     Priority: High  . Diabetes mellitus type 2, insulin dependent ([place])     Priority: High  . Hyperlipemia     Priority: High  . Degeneration of lumbar intervertebral disc 02/21/2018    Priority: Medium  . Chronic vertigo 03/15/2014    Priority: Medium  . Obesity (BMI 30.0-34.9) 01/31/2012    Priority: Medium  . Enlarged prostate without lower urinary tract symptoms (luts) 01/04/2011    Priority: Medium  . Gastro-esophageal reflux disease without esophagitis 12/07/2010    Priority: Medium  . Epistaxis, recurrent 04/11/2015    Priority: Low  . Osteoarthritis, knee 01/31/2012    Priority: Low  . Allergic rhinitis 10/28/2011    Priority: Low  . Hearing loss 06/08/2011    Priority: Low  . Primary osteoarthritis of hand 06/08/2011    Priority: Low  . Hematuria 08/27/2008    Priority: Low  . History of revision of total replacement of right hip joint 01/03/2018  . Leukocytosis 06/02/2016  . Carotid stenosis 06/02/2016   Current Meds  Medication Sig  . apixaban (ELIQUIS) 5 MG TABS tablet Take 1 tablet (5 mg total) by mouth 2 (two) times daily.  [name] atorvastatin (LIPITOR) 40 MG tablet Take 1 tablet (40 mg total) by mouth daily at 6 PM.  . AVODART 0.5 MG capsule Take 0.5 mg by  mouth every other day.   . diltiazem (CARDIZEM CD) 360 MG 24 hr capsule Take 1 capsule (360 mg total) by mouth daily.  Regino Schultze Bandages & Supports (KNEE BRACE ADJUSTABLE HINGED) MISC Wear daily as tolerated  . empagliflozin (JARDIANCE) 10 MG TABS tablet Take 10 mg by mouth daily.  . furosemide (LASIX) 40 MG tablet Take 1 tablet (40 mg total) by mouth daily.  [name] gabapentin (NEURONTIN) 100 MG capsule Take 1 capsule (100 mg total) by mouth 3 (three) times daily.  . insulin glargine (LANTUS) 100 UNIT/ML injection Inject 40 Units into the skin daily.  . meclizine (ANTIVERT) 25 MG tablet Take 1 tablet (25 mg total) by mouth 3 (three)  times daily as needed for dizziness.  . metFORMIN (GLUCOPHAGE) 500 MG tablet Take 500 mg by mouth 2 (two) times daily with a meal.  . metoprolol succinate (TOPROL-XL) 100 MG 24 hr tablet TAKE 1 TABLET BY MOUTH TWICE DAILY  . nitroGLYCERIN (NITROSTAT) 0.4 MG SL tablet Place 0.4 mg under the tongue every 5 (five) minutes as needed for chest pain (x 3 doses).  . pantoprazole (PROTONIX) 40 MG tablet TAKE 1 TABLET BY MOUTH DAILY AT NOON  . potassium chloride SA (K-DUR) 20 MEQ tablet potassium chloride ER 20 mEq tablet,extended release(part/cryst)  . PRECISION XTRA TEST STRIPS test strip TEST BLOOD SUGAR 1-2 TIMES DAILY  . Tamsulosin HCl (FLOMAX) 0.4 MG CAPS Take 0.4 mg by mouth every other day.     Allergies: Patient is allergic to erythromycin base; amoxicillin; cefadroxil; ciprofloxacin; and erythromycin. Family History: Patient family history includes Brain cancer in his father; Throat cancer in his brother. Social History:  Patient  reports that he quit smoking about 60 years ago. His smoking use included cigarettes. He has a 30.00 pack-year smoking history. He has never used smokeless tobacco. He reports that he does not drink alcohol or use drugs.  Review of Systems: Constitutional: Negative for fever malaise or anorexia Cardiovascular: negative for chest pain Respiratory: negative for SOB or persistent cough Gastrointestinal: negative for abdominal pain  Objective  Vitals: BP 128/78   Pulse 100   Temp 98.2 F (36.8 C) (Tympanic)   Resp 16   Ht 5\' 6"  (1.676 m)   Wt 198 lb 3.2 oz (89.9 kg)   SpO2 98%   BMI 31.99 kg/m  General: no acute distress , A&Ox3 Happy and normal gait. No cane today     Commons side effects, risks, benefits, and alternatives for medications and treatment plan prescribed today were discussed, and the patient expressed understanding of the given instructions. Patient is instructed to call or message via MyChart if he/she has any questions or concerns  regarding our treatment plan. No barriers to understanding were identified. We discussed Red Flag symptoms and signs in detail. Patient expressed understanding regarding what to do in case of urgent or emergency type symptoms.   Medication list was reconciled, printed and provided to the patient in AVS. Patient instructions and summary information was reviewed with the patient as documented in the AVS. This note was prepared with assistance of Dragon voice recognition software. Occasional wrong-word or sound-a-like substitutions may have occurred due to the inherent limitations of voice recognition software

## 2018-11-02 ENCOUNTER — Ambulatory Visit (INDEPENDENT_AMBULATORY_CARE_PROVIDER_SITE_OTHER): Payer: Medicare Other

## 2018-11-02 ENCOUNTER — Encounter: Payer: Self-pay | Admitting: Family Medicine

## 2018-11-02 ENCOUNTER — Ambulatory Visit (INDEPENDENT_AMBULATORY_CARE_PROVIDER_SITE_OTHER): Payer: Medicare Other | Admitting: Family Medicine

## 2018-11-02 ENCOUNTER — Other Ambulatory Visit: Payer: Self-pay

## 2018-11-02 VITALS — BP 130/72 | Temp 98.6°F | Ht 66.0 in | Wt 205.6 lb

## 2018-11-02 DIAGNOSIS — E119 Type 2 diabetes mellitus without complications: Secondary | ICD-10-CM

## 2018-11-02 DIAGNOSIS — Z794 Long term (current) use of insulin: Secondary | ICD-10-CM

## 2018-11-02 DIAGNOSIS — E162 Hypoglycemia, unspecified: Secondary | ICD-10-CM

## 2018-11-02 DIAGNOSIS — I10 Essential (primary) hypertension: Secondary | ICD-10-CM | POA: Diagnosis not present

## 2018-11-02 DIAGNOSIS — Z23 Encounter for immunization: Secondary | ICD-10-CM | POA: Diagnosis not present

## 2018-11-02 DIAGNOSIS — M48062 Spinal stenosis, lumbar region with neurogenic claudication: Secondary | ICD-10-CM | POA: Diagnosis not present

## 2018-11-02 DIAGNOSIS — Z0000 Encounter for general adult medical examination without abnormal findings: Secondary | ICD-10-CM

## 2018-11-02 DIAGNOSIS — E1142 Type 2 diabetes mellitus with diabetic polyneuropathy: Secondary | ICD-10-CM | POA: Diagnosis not present

## 2018-11-02 NOTE — Progress Notes (Signed)
I have reviewed the documentation from the recent AWV done by Kim Broome; I agree with the documentation and will follow up on any recommendations or abnormal findings as suggested.  

## 2018-11-02 NOTE — Patient Instructions (Addendum)
[name] , Thank you for taking time to come for your Medicare Wellness Visit. I appreciate your ongoing commitment to your health goals. Please review the following plan we discussed and let me know if I can assist you in the future.   Screening recommendations/referrals: Colorectal Screening: not indicated   Vision and Dental Exams: Recommended annual ophthalmology exams for early detection of glaucoma and other disorders of the eye Recommended annual dental exams for proper oral hygiene  Diabetic Exams: Diabetic Eye Exam: yearly  Diabetic Foot Exam: up to dated   Vaccinations: Influenza vaccine:  recommended this fall either at PCP office or through your local pharmacy  Pneumococcal vaccine: up to date  Tdap vaccine: up to date; last 02/15/17   Shingles vaccine: completed   Advanced directives: Please bring a copy of your POA (Power of [place]) and/or Living Will to your next appointment.  Goals: Recommend to remove any items from the home that may cause slips or trips.  Next appointment: Please schedule your Annual Wellness Visit with your Nurse Health Advisor in one year.  Preventive Care 44 Years and Older, Male Preventive care refers to lifestyle choices and visits with your health care provider that can promote health and wellness. What does preventive care include?  A yearly physical exam. This is also called an annual well check.  Dental exams once or twice a year.  Routine eye exams. Ask your health care provider how often you should have your eyes checked.  Personal lifestyle choices, including:  Daily care of your teeth and gums.  Regular physical activity.  Eating a healthy diet.  Avoiding tobacco and drug use.  Limiting alcohol use.  Practicing safe sex.  Taking low doses of aspirin every day if recommended by your health care provider..  Taking vitamin and mineral supplements as recommended by your health care provider. What happens during an  annual well check? The services and screenings done by your health care provider during your annual well check will depend on your age, overall health, lifestyle risk factors, and family history of disease. Counseling  Your health care provider may ask you questions about your:  Alcohol use.  Tobacco use.  Drug use.  Emotional well-being.  Home and relationship well-being.  Sexual activity.  Eating habits.  History of falls.  Memory and ability to understand (cognition).  Work and work Statistician. Screening  You may have the following tests or measurements:  Height, weight, and BMI.  Blood pressure.  Lipid and cholesterol levels. These may be checked every 5 years, or more frequently if you are over 73 years old.  Skin check.  Lung cancer screening. You may have this screening every year starting at age 26 if you have a 30-pack-year history of smoking and currently smoke or have quit within the past 15 years.  Fecal occult blood test (FOBT) of the stool. You may have this test every year starting at age 56.  Flexible sigmoidoscopy or colonoscopy. You may have a sigmoidoscopy every 5 years or a colonoscopy every 10 years starting at age 13.  Prostate cancer screening. Recommendations will vary depending on your family history and other risks.  Hepatitis C blood test.  Hepatitis B blood test.  Sexually transmitted disease (STD) testing.  Diabetes screening. This is done by checking your blood sugar (glucose) after you have not eaten for a while (fasting). You may have this done every 1-3 years.  Abdominal aortic aneurysm (AAA) screening. You may need this if you  are a current or former smoker.  Osteoporosis. You may be screened starting at age 74 if you are at high risk. Talk with your health care provider about your test results, treatment options, and if necessary, the need for more tests. Vaccines  Your health care provider may recommend certain vaccines,  such as:  Influenza vaccine. This is recommended every year.  Tetanus, diphtheria, and acellular pertussis (Tdap, Td) vaccine. You may need a Td booster every 10 years.  Zoster vaccine. You may need this after age 32.  Pneumococcal 13-valent conjugate (PCV13) vaccine. One dose is recommended after age 26.  Pneumococcal polysaccharide (PPSV23) vaccine. One dose is recommended after age 69. Talk to your health care provider about which screenings and vaccines you need and how often you need them. This information is not intended to replace advice given to you by your health care provider. Make sure you discuss any questions you have with your health care provider. Document Released: 03/14/2015 Document Revised: 11/05/2015 Document Reviewed: 12/17/2014 Elsevier Interactive Patient Education  2017 [place] Prevention in the Home Falls can cause injuries. They can happen to people of all ages. There are many things you can do to make your home safe and to help prevent falls. What can I do on the outside of my home?  Regularly fix the edges of walkways and driveways and fix any cracks.  Remove anything that might make you trip as you walk through a door, such as a raised step or threshold.  Trim any bushes or trees on the path to your home.  Use bright outdoor lighting.  Clear any walking paths of anything that might make someone trip, such as rocks or tools.  Regularly check to see if handrails are loose or broken. Make sure that both sides of any steps have handrails.  Any raised decks and porches should have guardrails on the edges.  Have any leaves, snow, or ice cleared regularly.  Use sand or salt on walking paths during winter.  Clean up any spills in your garage right away. This includes oil or grease spills. What can I do in the bathroom?  Use night lights.  Install grab bars by the toilet and in the tub and shower. Do not use towel bars as grab bars.  Use  non-skid mats or decals in the tub or shower.  If you need to sit down in the shower, use a plastic, non-slip stool.  Keep the floor dry. Clean up any water that spills on the floor as soon as it happens.  Remove soap buildup in the tub or shower regularly.  Attach bath mats securely with double-sided non-slip rug tape.  Do not have throw rugs and other things on the floor that can make you trip. What can I do in the bedroom?  Use night lights.  Make sure that you have a light by your bed that is easy to reach.  Do not use any sheets or blankets that are too big for your bed. They should not hang down onto the floor.  Have a firm chair that has side arms. You can use this for support while you get dressed.  Do not have throw rugs and other things on the floor that can make you trip. What can I do in the kitchen?  Clean up any spills right away.  Avoid walking on wet floors.  Keep items that you use a lot in easy-to-reach places.  If you need to reach  something above you, use a strong step stool that has a grab bar.  Keep electrical cords out of the way.  Do not use floor polish or wax that makes floors slippery. If you must use wax, use non-skid floor wax.  Do not have throw rugs and other things on the floor that can make you trip. What can I do with my stairs?  Do not leave any items on the stairs.  Make sure that there are handrails on both sides of the stairs and use them. Fix handrails that are broken or loose. Make sure that handrails are as long as the stairways.  Check any carpeting to make sure that it is firmly attached to the stairs. Fix any carpet that is loose or worn.  Avoid having throw rugs at the top or bottom of the stairs. If you do have throw rugs, attach them to the floor with carpet tape.  Make sure that you have a light switch at the top of the stairs and the bottom of the stairs. If you do not have them, ask someone to add them for you. What  else can I do to help prevent falls?  Wear shoes that:  Do not have high heels.  Have rubber bottoms.  Are comfortable and fit you well.  Are closed at the toe. Do not wear sandals.  If you use a stepladder:  Make sure that it is fully opened. Do not climb a closed stepladder.  Make sure that both sides of the stepladder are locked into place.  Ask someone to hold it for you, if possible.  Clearly mark and make sure that you can see:  Any grab bars or handrails.  First and last steps.  Where the edge of each step is.  Use tools that help you move around (mobility aids) if they are needed. These include:  Canes.  Walkers.  Scooters.  Crutches.  Turn on the lights when you go into a dark area. Replace any light bulbs as soon as they burn out.  Set up your furniture so you have a clear path. Avoid moving your furniture around.  If any of your floors are uneven, fix them.  If there are any pets around you, be aware of where they are.  Review your medicines with your doctor. Some medicines can make you feel dizzy. This can increase your chance of falling. Ask your doctor what other things that you can do to help prevent falls. This information is not intended to replace advice given to you by your health care provider. Make sure you discuss any questions you have with your health care provider. Document Released: 12/12/2008 Document Revised: 07/24/2015 Document Reviewed: 03/22/2014 Elsevier Interactive Patient Education  2017 [employer].

## 2018-11-02 NOTE — Progress Notes (Signed)
Subjective  CC:  Chief Complaint  Patient presents with  . Spinal Stenosis    Report that he is still doing well with Gabapentin  . Diabetes  . Hypertension    HPI: [name] is a 85 y.o. male who presents to the office today for follow up of diabetes and problems listed above in the chief complaint.   Diabetes follow up: His diabetic control is reported as Unchanged. However he reports symptomatic hypoglycemia once a month for the last several months. Blood sugars reveal good fasting control although some am down to 70s. sxs responde to OJ and chocolate. He denies changes in diet or skipping meals.   He denies exertional CP or SOB . He denies foot sores or paresthesias.   Back pain:completely controlled on gabapentin. Feels well!  HTN: controlled.   Wt Readings from Last 3 Encounters:  11/02/18 205 lb 9.6 oz (93.3 kg)  11/02/18 205 lb 9.6 oz (93.3 kg)  10/17/18 198 lb 3.2 oz (89.9 kg)    BP Readings from Last 3 Encounters:  11/02/18 130/72  11/02/18 130/72  10/17/18 128/78    Assessment  1. Diabetes mellitus type 2, insulin dependent ([place])   2. Diabetic peripheral neuropathy associated with type 2 diabetes mellitus ([place])   3. Essential (primary) hypertension   4. Spinal stenosis of lumbar region with neurogenic claudication   5. Hypoglycemia      Plan   Diabetes is currently very well controlled. Too tight for him. Suspect fluctuating diet or renal function ... will decrease insulin dosage and follow. Education given.   Continue other meds. Doing great.   Follow up: 3 months . No orders of the defined types were placed in this encounter.  No orders of the defined types were placed in this encounter.     Immunization History  Administered Date(s) Administered  . Influenza Split 11/14/2007, 01/01/2009, 12/23/2009  . Influenza, High Dose Seasonal PF 11/02/2012, 12/12/2013, 11/15/2014, 11/24/2017, 11/02/2018  . Influenza, Seasonal, Injecte, Preservative  Fre 11/18/2015  . Influenza,trivalent, recombinat, inj, PF 12/07/2010  . Influenza-Unspecified 10/28/2011, 11/30/2014, 02/15/2017  . Pneumococcal Conjugate-13 12/13/2013  . Pneumococcal-Unspecified 08/20/2008  . Td 05/29/2002  . Tdap 10/28/2011, 02/15/2017  . Zoster 08/20/2008  . Zoster Recombinat (Shingrix) 10/13/2016, 12/13/2016    Diabetes Related Lab Review: Lab Results  Component Value Date   HGBA1C 7.7 (A) 10/04/2018   HGBA1C 8.8 (A) 08/03/2018   HGBA1C 8.0 (A) 03/24/2018    Lab Results  Component Value Date   MICROALBUR 3.5 (H) 08/01/2017   Lab Results  Component Value Date   CREATININE 1.21 10/13/2018   BUN 18 10/13/2018   NA 141 10/13/2018   K 5.2 10/13/2018   CL 101 10/13/2018   CO2 24 10/13/2018   Lab Results  Component Value Date   CHOL 143 10/04/2018   CHOL 146 12/07/2017   CHOL 159 07/20/2017   Lab Results  Component Value Date   HDL 44.60 10/04/2018   HDL 44.70 12/07/2017   HDL 42 07/20/2017   Lab Results  Component Value Date   LDLCALC 77 10/04/2018   LDLCALC 85 12/07/2017   LDLCALC 89 07/20/2017   Lab Results  Component Value Date   TRIG 106.0 10/04/2018   TRIG 78.0 12/07/2017   TRIG 139 07/20/2017   Lab Results  Component Value Date   CHOLHDL 3 10/04/2018   CHOLHDL 3 12/07/2017   CHOLHDL 4.0 06/07/2016   No results found for: LDLDIRECT The ASCVD Risk score (Goff DC  Jr., et al., 2013) failed to calculate for the following reasons:   The 2013 ASCVD risk score is only valid for ages 45 to 20   The patient has a prior MI or stroke diagnosis I have reviewed the PMH, Fam and Soc history. Patient Active Problem List   Diagnosis Date Noted  . Spinal stenosis of lumbar region 02/21/2018    Priority: High  . Diabetic peripheral neuropathy associated with type 2 diabetes mellitus ([place]) 05/05/2017    Priority: High    No pain   . Atherosclerotic heart disease of native coronary artery without angina pectoris 06/13/2016    Priority: High     Overview:  Overview:  RCA DES placed Nov 2016, LIMA-LAD presumed patent based on Myoview but no visualized, SVG-Dx occluded  Last Assessment & Plan:  RCA DES placed Nov 2016, LIMA-LAD presumed patent based on Myoview but no visualized, SVG-Dx occluded   . CVA (cerebral vascular accident) ([place]) 06/06/2016    Priority: High  . Recurrent coronary arteriosclerosis after percutaneous transluminal coronary angioplasty 04/18/2015    Priority: High    Overview:  Overview:  RCA DES placed Nov 2016, LIMA-LAD presumed patent based on Myoview but no visualized, SVG-Dx occluded  Last Assessment & Plan:  RCA DES placed Nov 2016, LIMA-LAD presumed patent based on Myoview but no visualized, SVG-Dx occluded   . CAD S/P PCI- Nov 2016     Priority: High    RCA DES placed Nov 2016, LIMA-LAD presumed patent based on Myoview but no visualized, SVG-Dx occluded   . Current use of long term anticoagulation 02/06/2013    Priority: High    Overview:  Monitored by cardiology   . Chronic atrial fibrillation 07/30/2011    Priority: High    CHADs VASc=5 for age, HTN, vascular disease, and DM   . Hx of CABG x 2 1990     Priority: High    Status post CABG x2 in 1990 including an LIMA graft to the LAD, and a vein graft to the diagonal.    . Essential (primary) hypertension     Priority: High  . Diabetes mellitus type 2, insulin dependent ([place])     Priority: High  . Hyperlipemia     Priority: High  . Degeneration of lumbar intervertebral disc 02/21/2018    Priority: Medium  . Chronic vertigo 03/15/2014    Priority: Medium  . Obesity (BMI 30.0-34.9) 01/31/2012    Priority: Medium  . Enlarged prostate without lower urinary tract symptoms (luts) 01/04/2011    Priority: Medium    Overview:  Urology - avodart and tamuloscin   . Gastro-esophageal reflux disease without esophagitis 12/07/2010    Priority: Medium  . Epistaxis, recurrent 04/11/2015    Priority: Low  . Osteoarthritis, knee 01/31/2012     Priority: Low  . Allergic rhinitis 10/28/2011    Priority: Low  . Hearing loss 06/08/2011    Priority: Low  . Primary osteoarthritis of hand 06/08/2011    Priority: Low  . Hematuria 08/27/2008    Priority: Low    Overview:  Essential Hematuria - urology - Grapey  w/u benign  10/1 IMO update Overview:  Overview:  Essential Hematuria - urology - Grapey  w/u benign   . History of revision of total replacement of right hip joint 01/03/2018  . Leukocytosis 06/02/2016  . Carotid stenosis 06/02/2016    Social History: Patient  reports that he quit smoking about 60 years ago. His smoking use included cigarettes. He has a  30.00 pack-year smoking history. He has never used smokeless tobacco. He reports that he does not drink alcohol or use drugs.  Review of Systems: Ophthalmic: negative for eye pain, loss of vision or double vision Cardiovascular: negative for chest pain Respiratory: negative for SOB or persistent cough Gastrointestinal: negative for abdominal pain Genitourinary: negative for dysuria or gross hematuria MSK: negative for foot lesions Neurologic: negative for weakness or gait disturbance  Objective  Vitals: BP 130/72 (BP Location: Left Arm, Patient Position: Sitting, Cuff Size: Normal)   Temp 98.6 F (37 C) (Temporal)   Ht 5\' 6"  (1.676 m)   Wt 205 lb 9.6 oz (93.3 kg)   BMI 33.18 kg/m  General: well appearing, no acute distress , appears well.  Psych:  Alert and oriented, normal mood and affect HEENT:  Normocephalic, atraumatic, moist mucous membranes, supple neck  Cardiovascular: irreg irreg with murmur, no edema today Respiratory:  Good breath sounds bilaterally, CTAB with normal effort, no rales Gastrointestinal: normal BS, soft, nontender Skin:  Warm, no rashes Neurologic:   Mental status is normal. normal gait   Diabetic education: ongoing education regarding chronic disease management for diabetes was given today. We continue to reinforce the ABC's  of diabetic management: A1c (<7 or 8 dependent upon patient), tight blood pressure control, and cholesterol management with goal LDL < 100 minimally. We discuss diet strategies, exercise recommendations, medication options and possible side effects. At each visit, we review recommended immunizations and preventive care recommendations for diabetics and stress that good diabetic control can prevent other problems. See below for this patient's data.    Commons side effects, risks, benefits, and alternatives for medications and treatment plan prescribed today were discussed, and the patient expressed understanding of the given instructions. Patient is instructed to call or message via MyChart if he/she has any questions or concerns regarding our treatment plan. No barriers to understanding were identified. We discussed Red Flag symptoms and signs in detail. Patient expressed understanding regarding what to do in case of urgent or emergency type symptoms.   Medication list was reconciled, printed and provided to the patient in AVS. Patient instructions and summary information was reviewed with the patient as documented in the AVS. This note was prepared with assistance of Dragon voice recognition software. Occasional wrong-word or sound-a-like substitutions may have occurred due to the inherent limitations of voice recognition software

## 2018-11-02 NOTE — Patient Instructions (Signed)
Please return in 3 months for recheck.   If you have any questions or concerns, please don't hesitate to send me a message via MyChart or call the office at [phone]. Thank you for visiting with [place] today! It's our pleasure caring for you.  Decrease your Lantus to 35 units every morning.   I want to avoid sugars < 80 at all times.

## 2018-11-02 NOTE — Progress Notes (Signed)
Subjective:   [name] is a 85 y.o. male who presents for Medicare Annual/Subsequent preventive examination.  Review of Systems:   Cardiac Risk Factors include: advanced age (>79men, >63 women);male gender;diabetes mellitus     Objective:    Vitals: BP 130/72 (BP Location: Left Arm, Patient Position: Sitting, Cuff Size: Normal)   Temp 98.6 F (37 C) (Temporal)   Ht 5\' 6"  (1.676 m)   Wt 205 lb 9.6 oz (93.3 kg)   BMI 33.18 kg/m   Body mass index is 33.18 kg/m.  Advanced Directives 11/02/2018 06/08/2017 12/02/2016 06/08/2016 06/02/2016 06/02/2016 09/24/2015  Does Patient Have a Medical Advance Directive? Yes Yes Yes No Yes No Yes  Type of Advance Directive Living will;Healthcare Power of Attorney Living will;Healthcare Power of [place];Living will - [place];Living will - Living will;Healthcare Power of Attorney  Does patient want to make changes to medical advance directive? No - Patient declined - - - No - Patient declined - No - Patient declined  Copy of [place] in Chart? No - copy requested No - copy requested - - No - copy requested - No - copy requested  Would patient like information on creating a medical advance directive? - - - No - Patient declined - - -    Tobacco Social History   Tobacco Use  Smoking Status Former Smoker  . Packs/day: 3.00  . Years: 10.00  . Pack years: 30.00  . Types: Cigarettes  . Quit date: 05/11/1958  . Years since quitting: 60.5  Smokeless Tobacco Never Used      Clinical Intake:  Pre-visit preparation completed: Yes  Pain : No/denies pain  Diabetes: No  How often do you need to have someone help you when you read instructions, pamphlets, or other written materials from your doctor or pharmacy?: 1 - Never  Interpreter Needed?: No  Information entered by :: Denman George LPN  Past Medical History:  Diagnosis Date  . Abdominal pain   . Acute renal failure  ([place])     resolved  . Arthritis    "hands & legs" (11/'10/2014)  . Ataxia   . Atrial fibrillation ([place])   . Bladder outlet obstruction   . Bladder outlet obstruction   . BPH (benign prostatic hyperplasia)   . CAD (coronary artery disease)    a. CABG IN 1989. b. 01/08/2015 CTO of ost LAD, LIMA to LAD not visualized but assumed patent given myoview finding, occluded SVG to diagonal, 99% mid RCA tx w/ SYNERGY DES 3X28 mm  . Constipation   . Diabetes mellitus type 2, insulin dependent ([place])   . Epistaxis, recurrent Feb 2017  . GERD (gastroesophageal reflux disease)   . HTN (hypertension)   . Hx of bacterial pneumonia   . Hyperlipemia   . Kidney stones   . Mild aortic stenosis   . Rib fractures    left rib fractures being treated with pain medications  . Stented coronary artery Nov 2016   RCA DES  . Urinary tract infection     Enterococcus   Past Surgical History:  Procedure Laterality Date  . CARDIAC CATHETERIZATION  1989  . CARDIAC CATHETERIZATION N/A 01/08/2015   Procedure: Left Heart Cath and Cors/Grafts Angiography;  Surgeon: Peter M Martinique, MD;  Location: [facility];  Service: Cardiovascular;  Laterality: N/A;  . CARDIAC CATHETERIZATION  01/08/2015   Procedure: Coronary Stent Intervention;  Surgeon: Peter M Martinique, MD;  Location: [facility]  INVASIVE CV LAB;  Service: Cardiovascular;;  . CARPAL TUNNEL RELEASE Right 08/2010  . CATARACT EXTRACTION W/ INTRAOCULAR LENS  IMPLANT, BILATERAL Bilateral   . CORONARY ANGIOPLASTY  01/08/15   RCA DES  . CORONARY ARTERY BYPASS GRAFT  1989   "CABG X 2"  . JOINT REPLACEMENT    . TONSILLECTOMY    . TOTAL HIP ARTHROPLASTY Right 2000   Family History  Problem Relation Age of Onset  . Brain cancer Father   . Throat cancer Brother    Social History   Socioeconomic History  . Marital status: Married    Spouse name: Not on file  . Number of children: 3  . Years of education: Not on file  . Highest education level: Not on file   Occupational History  . Occupation: Agricultural consultant  Social Needs  . Financial resource strain: Not on file  . Food insecurity    Worry: Not on file    Inability: Not on file  . Transportation needs    Medical: Yes    Non-medical: Yes  Tobacco Use  . Smoking status: Former Smoker    Packs/day: 3.00    Years: 10.00    Pack years: 30.00    Types: Cigarettes    Quit date: 05/11/1958    Years since quitting: 60.5  . Smokeless tobacco: Never Used  Substance and Sexual Activity  . Alcohol use: No  . Drug use: No  . Sexual activity: Not Currently  Lifestyle  . Physical activity    Days per week: Not on file    Minutes per session: Not on file  . Stress: Not on file  Relationships  . Social Herbalist on phone: Not on file    Gets together: Not on file    Attends religious service: Not on file    Active member of club or organization: Not on file    Attends meetings of clubs or organizations: Not on file    Relationship status: Not on file  Other Topics Concern  . Not on file  Social History Narrative   Previously lived in [place], [place] currently living in [place], [place]     Outpatient Encounter Medications as of 11/02/2018  Medication Sig  . amoxicillin (AMOXIL) 500 MG capsule Take 4 caps by mouth 1 hours prior to dental procedure  . apixaban (ELIQUIS) 5 MG TABS tablet Take 1 tablet (5 mg total) by mouth 2 (two) times daily.  [name] atorvastatin (LIPITOR) 40 MG tablet Take 1 tablet (40 mg total) by mouth daily at 6 PM.  . AVODART 0.5 MG capsule Take 0.5 mg by mouth every other day.   . diltiazem (CARDIZEM CD) 360 MG 24 hr capsule Take 1 capsule (360 mg total) by mouth daily.  Regino Schultze Bandages & Supports (KNEE BRACE ADJUSTABLE HINGED) MISC Wear daily as tolerated  . empagliflozin (JARDIANCE) 10 MG TABS tablet Take 10 mg by mouth daily.  [name] gabapentin (NEURONTIN) 100 MG capsule Take 1 capsule (100 mg total) by mouth 3 (three) times daily.  . insulin  glargine (LANTUS) 100 UNIT/ML injection Inject 40 Units into the skin daily.  . meclizine (ANTIVERT) 25 MG tablet Take 1 tablet (25 mg total) by mouth 3 (three) times daily as needed for dizziness.  . metFORMIN (GLUCOPHAGE) 500 MG tablet Take 500 mg by mouth 2 (two) times daily with a meal.  . metoprolol succinate (TOPROL-XL) 100 MG 24 hr tablet TAKE 1 TABLET BY  MOUTH TWICE DAILY  . nitroGLYCERIN (NITROSTAT) 0.4 MG SL tablet Place 0.4 mg under the tongue every 5 (five) minutes as needed for chest pain (x 3 doses).  . pantoprazole (PROTONIX) 40 MG tablet TAKE 1 TABLET BY MOUTH DAILY AT NOON  . PRECISION XTRA TEST STRIPS test strip TEST BLOOD SUGAR 1-2 TIMES DAILY  . Tamsulosin HCl (FLOMAX) 0.4 MG CAPS Take 0.4 mg by mouth every other day.   . furosemide (LASIX) 40 MG tablet Take 1 tablet (40 mg total) by mouth daily.  . potassium chloride SA (K-DUR) 20 MEQ tablet potassium chloride ER 20 mEq tablet,extended release(part/cryst)   No facility-administered encounter medications on file as of 11/02/2018.     Activities of Daily Living In your present state of health, do you have any difficulty performing the following activities: 11/02/2018  Hearing? Y  Vision? N  Difficulty concentrating or making decisions? N  Walking or climbing stairs? Y  Comment intermittent  Dressing or bathing? N  Doing errands, shopping? N  Preparing Food and eating ? N  Using the Toilet? N  In the past six months, have you accidently leaked urine? N  Do you have problems with loss of bowel control? N  Managing your Medications? N  Managing your Finances? N  Housekeeping or managing your Housekeeping? N  Some recent data might be hidden    Patient Care Team: [name], MD as PCP - General (Family Medicine) Martinique, Peter M, MD as PCP - Cardiology (Cardiology) Martinique, Peter M, MD as Consulting Physician (Cardiology) Gardiner Barefoot, DPM as Consulting Physician (Podiatry) [name], Jessy Oto, MD as Consulting  Physician (Vascular Surgery) Melida Quitter, MD as Consulting Physician (Otolaryngology) Zadie Rhine Clent Demark, MD as Consulting Physician (Ophthalmology) [place] as Consulting Physician (General Practice)   Assessment:   This is a routine wellness examination for [employer].  Exercise Activities and Dietary recommendations Current Exercise Habits: The patient does not participate in regular exercise at present  Goals    . Patient Stated     Maintain current health by staying active.        Fall Risk Fall Risk  11/02/2018 06/08/2017 02/16/2017  Falls in the past year? 0 No No  Number falls in past yr: 0 - -  Injury with Fall? 0 - -  Risk for fall due to : Impaired balance/gait - -  Follow up Education provided - -   Is the patient's home free of loose throw rugs in walkways, pet beds, electrical cords, etc?   yes      Grab bars in the bathroom? yes      Handrails on the stairs?   yes      Adequate lighting?   yes  Timed Get Up and Go Performed: completed and within normal timeframe   Depression Screen PHQ 2/9 Scores 11/02/2018 06/14/2018 06/08/2017 02/16/2017  PHQ - 2 Score 0 0 0 0    Cognitive Function- no cognitive concerns at this time  MMSE - Mini Mental State Exam 06/08/2017  Orientation to time 5  Orientation to Place 5  Registration 3  Attention/ Calculation 3  Recall 1  Language- name 2 objects 2  Language- repeat 1  Language- follow 3 step command 3  Language- read & follow direction 1  Write a sentence 1  Copy design 1  Total score 26        Immunization History  Administered Date(s) Administered  . Influenza Split 11/14/2007, 01/01/2009, 12/23/2009  . Influenza, High  Dose Seasonal PF 11/02/2012, 12/12/2013, 11/15/2014, 11/24/2017, 11/02/2018  . Influenza, Seasonal, Injecte, Preservative Fre 11/18/2015  . Influenza,trivalent, recombinat, inj, PF 12/07/2010  . Influenza-Unspecified 10/28/2011, 11/30/2014, 02/15/2017  . Pneumococcal Conjugate-13  12/13/2013  . Pneumococcal-Unspecified 08/20/2008  . Td 05/29/2002  . Tdap 10/28/2011, 02/15/2017  . Zoster 08/20/2008  . Zoster Recombinat (Shingrix) 10/13/2016, 12/13/2016    Qualifies for Shingles Vaccine? Shingrix completed   Screening Tests Health Maintenance  Topic Date Due  . OPHTHALMOLOGY EXAM  06/09/2018  . INFLUENZA VACCINE  09/30/2018  . HEMOGLOBIN A1C  04/06/2019  . FOOT EXAM  06/30/2019  . TETANUS/TDAP  02/16/2027  . PNA vac Low Risk Adult  Completed  . URINE MICROALBUMIN  Discontinued   Cancer Screenings: Lung: Low Dose CT Chest recommended if Age 21-80 years, 30 pack-year currently smoking OR have quit w/in 15years. Patient does not qualify. Colorectal: not indicated       Plan:    I have personally reviewed and addressed the Medicare Annual Wellness questionnaire and have noted the following in the patient's chart:  A. Medical and social history B. Use of alcohol, tobacco or illicit drugs  C. Current medications and supplements D. Functional ability and status E.  Nutritional status F.  Physical activity G. Advance directives H. List of other physicians I.  Hospitalizations, surgeries, and ER visits in previous 12 months J.  Industry such as hearing and vision if needed, cognitive and depression L. Referrals, records requested, and appointments-none   In addition, I have reviewed and discussed with patient certain preventive protocols, quality metrics, and best practice recommendations. A written personalized care plan for preventive services as well as general preventive health recommendations were provided to patient.   Signed,  Denman George, LPN  Nurse Health Advisor   Nurse Notes: no additions

## 2018-11-06 ENCOUNTER — Encounter: Payer: Medicare Other | Admitting: Family Medicine

## 2018-11-08 ENCOUNTER — Ambulatory Visit (INDEPENDENT_AMBULATORY_CARE_PROVIDER_SITE_OTHER): Payer: Medicare Other | Admitting: Family Medicine

## 2018-11-08 ENCOUNTER — Encounter: Payer: Self-pay | Admitting: Family Medicine

## 2018-11-08 ENCOUNTER — Other Ambulatory Visit: Payer: Self-pay

## 2018-11-08 VITALS — BP 126/72 | HR 83 | Temp 98.6°F | Resp 16 | Ht 66.0 in | Wt 199.4 lb

## 2018-11-08 DIAGNOSIS — M48062 Spinal stenosis, lumbar region with neurogenic claudication: Secondary | ICD-10-CM | POA: Diagnosis not present

## 2018-11-08 DIAGNOSIS — R600 Localized edema: Secondary | ICD-10-CM | POA: Diagnosis not present

## 2018-11-08 DIAGNOSIS — J301 Allergic rhinitis due to pollen: Secondary | ICD-10-CM | POA: Diagnosis not present

## 2018-11-08 DIAGNOSIS — I10 Essential (primary) hypertension: Secondary | ICD-10-CM

## 2018-11-08 DIAGNOSIS — E119 Type 2 diabetes mellitus without complications: Secondary | ICD-10-CM

## 2018-11-08 DIAGNOSIS — Z794 Long term (current) use of insulin: Secondary | ICD-10-CM | POA: Diagnosis not present

## 2018-11-08 MED ORDER — GABAPENTIN 300 MG PO CAPS: 300.0000 mg | ORAL_CAPSULE | Freq: Three times a day (TID) | ORAL | 3 refills | Status: DC

## 2018-11-08 NOTE — Progress Notes (Signed)
Subjective  CC:  Chief Complaint  Patient presents with  . Leg Pain    Right side.. Started Sunday on/off since.. Denies any injury or radiating pain    HPI: [name] is a 85 y.o. male who presents to the office today to address the problems listed above in the chief complaint.  Right leg pain: had done well but had 2 discrete episodes in the last four days, one required oxycodone. No new sxs. Feels well now. On gabapentin low dose which has helped immensely. No AEs.   DM: lowered insulin dosage and fastings now 100-110 on avg. No more lows.   HTN with occ edema: took one lasix and potassium and swelling resolved. No sob or cp  Allergies are active at night. No uri sxs or fevers or sob. Some PND.    Assessment  1. Spinal stenosis of lumbar region with neurogenic claudication   2. Essential (primary) hypertension   3. Diabetes mellitus type 2, insulin dependent ([place])   4. Seasonal allergic rhinitis due to pollen   5. Leg edema      Plan   Spinal stenosis:  Improved with intermittent pain. Will titrate up gabapentin. See avs. See if he can tolerate tid dosing. Discussed risks of side effects including somnolence.   HTN is controlled. Agree with prn lasix for swelling. Cards aware.  DM: improved with insulin decrease. Need to avoid lows.   SAR: reassured.   Follow up: Return for as scheduled.  02/06/2019  No orders of the defined types were placed in this encounter.  Meds ordered this encounter  Medications  . gabapentin (NEURONTIN) 300 MG capsule    Sig: Take 1 capsule (300 mg total) by mouth 3 (three) times daily.    Dispense:  270 capsule    Refill:  3      I reviewed the patients updated PMH, FH, and SocHx.    Patient Active Problem List   Diagnosis Date Noted  . Spinal stenosis of lumbar region 02/21/2018    Priority: High  . Diabetic peripheral neuropathy associated with type 2 diabetes mellitus ([place]) 05/05/2017    Priority: High  .  Atherosclerotic heart disease of native coronary artery without angina pectoris 06/13/2016    Priority: High  . CVA (cerebral vascular accident) ([place]) 06/06/2016    Priority: High  . Recurrent coronary arteriosclerosis after percutaneous transluminal coronary angioplasty 04/18/2015    Priority: High  . CAD S/P PCI- Nov 2016     Priority: High  . Current use of long term anticoagulation 02/06/2013    Priority: High  . Chronic atrial fibrillation 07/30/2011    Priority: High  . Hx of CABG x 2 1990     Priority: High  . Essential (primary) hypertension     Priority: High  . Diabetes mellitus type 2, insulin dependent ([place])     Priority: High  . Hyperlipemia     Priority: High  . Degeneration of lumbar intervertebral disc 02/21/2018    Priority: Medium  . Chronic vertigo 03/15/2014    Priority: Medium  . Obesity (BMI 30.0-34.9) 01/31/2012    Priority: Medium  . Enlarged prostate without lower urinary tract symptoms (luts) 01/04/2011    Priority: Medium  . Gastro-esophageal reflux disease without esophagitis 12/07/2010    Priority: Medium  . Epistaxis, recurrent 04/11/2015    Priority: Low  . Osteoarthritis, knee 01/31/2012    Priority: Low  . Allergic rhinitis 10/28/2011    Priority: Low  . Hearing  loss 06/08/2011    Priority: Low  . Primary osteoarthritis of hand 06/08/2011    Priority: Low  . Hematuria 08/27/2008    Priority: Low  . History of revision of total replacement of right hip joint 01/03/2018  . Leukocytosis 06/02/2016  . Carotid stenosis 06/02/2016   Current Meds  Medication Sig  . amoxicillin (AMOXIL) 500 MG capsule Take 4 caps by mouth 1 hours prior to dental procedure  . apixaban (ELIQUIS) 5 MG TABS tablet Take 1 tablet (5 mg total) by mouth 2 (two) times daily.  [name] atorvastatin (LIPITOR) 40 MG tablet Take 1 tablet (40 mg total) by mouth daily at 6 PM.  . AVODART 0.5 MG capsule Take 0.5 mg by mouth every other day.   . diltiazem (CARDIZEM CD) 360 MG 24  hr capsule Take 1 capsule (360 mg total) by mouth daily.  Regino Schultze Bandages & Supports (KNEE BRACE ADJUSTABLE HINGED) MISC Wear daily as tolerated  . empagliflozin (JARDIANCE) 10 MG TABS tablet Take 10 mg by mouth daily.  [name] gabapentin (NEURONTIN) 300 MG capsule Take 1 capsule (300 mg total) by mouth 3 (three) times daily.  . insulin glargine (LANTUS) 100 UNIT/ML injection Inject 35 Units into the skin daily.  . meclizine (ANTIVERT) 25 MG tablet Take 1 tablet (25 mg total) by mouth 3 (three) times daily as needed for dizziness.  . metFORMIN (GLUCOPHAGE) 500 MG tablet Take 500 mg by mouth 2 (two) times daily with a meal.  . metoprolol succinate (TOPROL-XL) 100 MG 24 hr tablet TAKE 1 TABLET BY MOUTH TWICE DAILY  . nitroGLYCERIN (NITROSTAT) 0.4 MG SL tablet Place 0.4 mg under the tongue every 5 (five) minutes as needed for chest pain (x 3 doses).  . pantoprazole (PROTONIX) 40 MG tablet TAKE 1 TABLET BY MOUTH DAILY AT NOON  . PRECISION XTRA TEST STRIPS test strip TEST BLOOD SUGAR 1-2 TIMES DAILY  . Tamsulosin HCl (FLOMAX) 0.4 MG CAPS Take 0.4 mg by mouth every other day.   . [DISCONTINUED] gabapentin (NEURONTIN) 100 MG capsule Take 1 capsule (100 mg total) by mouth 3 (three) times daily.    Allergies: Patient is allergic to erythromycin base; amoxicillin; cefadroxil; ciprofloxacin; and erythromycin. Family History: Patient family history includes Brain cancer in his father; Throat cancer in his brother. Social History:  Patient  reports that he quit smoking about 60 years ago. His smoking use included cigarettes. He has a 30.00 pack-year smoking history. He has never used smokeless tobacco. He reports that he does not drink alcohol or use drugs.  Review of Systems: Constitutional: Negative for fever malaise or anorexia Cardiovascular: negative for chest pain Respiratory: negative for SOB or persistent cough Gastrointestinal: negative for abdominal pain  Objective  Vitals: BP 126/72   Pulse  83   Temp 98.6 F (37 C) (Tympanic)   Resp 16   Ht 5\' 6"  (1.676 m)   Wt 199 lb 6.4 oz (90.4 kg)   SpO2 95%   BMI 32.18 kg/m  General: no acute distress , A&Ox3 HEENT: PEERL, conjunctiva normal, Oropharynx moist,neck is supple Cardiovascular:  irreg irreg, no peripheral edema Respiratory:  Good breath sounds bilaterally, CTAB with normal respiratory effort Skin:  Warm, no rashes Nl gait     Commons side effects, risks, benefits, and alternatives for medications and treatment plan prescribed today were discussed, and the patient expressed understanding of the given instructions. Patient is instructed to call or message via MyChart if he/she has any questions or concerns regarding our  treatment plan. No barriers to understanding were identified. We discussed Red Flag symptoms and signs in detail. Patient expressed understanding regarding what to do in case of urgent or emergency type symptoms.   Medication list was reconciled, printed and provided to the patient in AVS. Patient instructions and summary information was reviewed with the patient as documented in the AVS. This note was prepared with assistance of Dragon voice recognition software. Occasional wrong-word or sound-a-like substitutions may have occurred due to the inherent limitations of voice recognition software

## 2018-11-08 NOTE — Patient Instructions (Addendum)
Please return as scheduled.   Please start increasing the gabapentin dosing as follows: Take 300mg  at night tonight and tomorrow night, with 100mg  in the am and afternoon. Then, increase to 300mg  in the morning and nighttime, with the 100mg  in the afternoon - you can do this dosing for a week.  IF you tolerate that, then can increase to 300mg  3x/day.  Call me if you have questions or problems.   Please set up an appointment for a diabetic eye exam and have the results sent to me.   If you have any questions or concerns, please don't hesitate to send me a message via MyChart or call the office at [phone]. Thank you for visiting with [name] today! It's our pleasure caring for you.

## 2018-11-10 NOTE — Progress Notes (Signed)
This encounter was created in error - please disregard.

## 2018-11-10 NOTE — Addendum Note (Signed)
Addended by: Denman George B on: 11/10/2018 02:09 PM   Modules accepted: Orders

## 2018-11-10 NOTE — Progress Notes (Signed)
Flu shot administration information updated to correct manufacturer

## 2018-11-20 ENCOUNTER — Ambulatory Visit (INDEPENDENT_AMBULATORY_CARE_PROVIDER_SITE_OTHER): Payer: Medicare Other | Admitting: Family Medicine

## 2018-11-20 ENCOUNTER — Ambulatory Visit (INDEPENDENT_AMBULATORY_CARE_PROVIDER_SITE_OTHER): Payer: Medicare Other

## 2018-11-20 ENCOUNTER — Other Ambulatory Visit: Payer: Self-pay

## 2018-11-20 ENCOUNTER — Encounter: Payer: Self-pay | Admitting: Family Medicine

## 2018-11-20 VITALS — BP 122/88 | HR 69 | Temp 97.2°F | Resp 16 | Ht 66.0 in | Wt 199.0 lb

## 2018-11-20 DIAGNOSIS — M7061 Trochanteric bursitis, right hip: Secondary | ICD-10-CM

## 2018-11-20 DIAGNOSIS — M48062 Spinal stenosis, lumbar region with neurogenic claudication: Secondary | ICD-10-CM

## 2018-11-20 DIAGNOSIS — M5137 Other intervertebral disc degeneration, lumbosacral region: Secondary | ICD-10-CM | POA: Diagnosis not present

## 2018-11-20 MED ORDER — TRIAMCINOLONE ACETONIDE 40 MG/ML IJ SUSP
20.0000 mg | Freq: Once | INTRAMUSCULAR | Status: AC
  Administered 2018-11-20: 20 mg via INTRAMUSCULAR

## 2018-11-20 NOTE — Patient Instructions (Signed)
I will review your records from [facility] and then let you know what the next step is.   Ice the hip today and tomorrow.  Continue the gabapentin.  Take oxycodone at night IF you need to.  Your sugars may be elevated because of the steroid shot today. Drink plenty of water and avoid sweets.

## 2018-11-20 NOTE — Progress Notes (Addendum)
Subjective  CC:  Chief Complaint  Patient presents with  . Back Pain    Started getting worse again on 9/19, states that he is having difficulty walking and sleeping in the bed.. Reports that the pain is right side hip and causing left leg swelling    HPI: [name] is a 85 y.o. male who presents to the office today to address the problems listed above in the chief complaint.  85 yo with known lumbar djd, spinal stenosis, hip and knee arthritis presents due to worsening pain: pain in right buttock and lateral thigh causing pain, weakness, gait problems and interfering with sleep. Has been on gabapentin and doing well but now sxs are bad again. Has seen ortho/PMR and has epidural steroid injections w/o benefit. I'm not sure when he had his last MRI and can't see records at this time. He denies B/B dysfunction. hasnt used any oxycodone for pain although he has some. He c/o lateral hip pain as well. He worries pain is coming from his hip arthritis rather than his back. He notes that he can sit up in recliner and sleep but cannot lie flat on his back in the bed as this worsens his pain.    Assessment  1. Spinal stenosis of lumbar region with neurogenic claudication   2. Greater trochanteric bursitis of right hip      Plan   Spinal stenosis:  Suspect most pain is due to DJD and Spinal stenosis: will request records, consider MRI or NS evaluation, continue gabapentin and rec prn oxycodone if needed. Defer pred due to diabetes and lack of efficacy in past. Check xrays to ensure no acute changes in lumbar spine or hip are contributing.   S/p steroid injection for contributing bursitis. Routine post procedure care instructions were given to patient in detail.   Follow up: No follow-ups on file.  02/06/2019  Orders Placed This Encounter  Procedures  . DG HIP UNILAT W OR W/O PELVIS 2-3 VIEWS RIGHT  . DG Lumbar Spine Complete   No orders of the defined types were placed in this  encounter.     I reviewed the patients updated PMH, FH, and SocHx.    Patient Active Problem List   Diagnosis Date Noted  . Spinal stenosis of lumbar region 02/21/2018    Priority: High  . Diabetic peripheral neuropathy associated with type 2 diabetes mellitus ([place]) 05/05/2017    Priority: High  . Atherosclerotic heart disease of native coronary artery without angina pectoris 06/13/2016    Priority: High  . CVA (cerebral vascular accident) ([place]) 06/06/2016    Priority: High  . Recurrent coronary arteriosclerosis after percutaneous transluminal coronary angioplasty 04/18/2015    Priority: High  . CAD S/P PCI- Nov 2016     Priority: High  . Current use of long term anticoagulation 02/06/2013    Priority: High  . Chronic atrial fibrillation 07/30/2011    Priority: High  . Hx of CABG x 2 1990     Priority: High  . Essential (primary) hypertension     Priority: High  . Diabetes mellitus type 2, insulin dependent ([place])     Priority: High  . Hyperlipemia     Priority: High  . Degeneration of lumbar intervertebral disc 02/21/2018    Priority: Medium  . Chronic vertigo 03/15/2014    Priority: Medium  . Obesity (BMI 30.0-34.9) 01/31/2012    Priority: Medium  . Enlarged prostate without lower urinary tract symptoms (luts) 01/04/2011  Priority: Medium  . Gastro-esophageal reflux disease without esophagitis 12/07/2010    Priority: Medium  . Epistaxis, recurrent 04/11/2015    Priority: Low  . Osteoarthritis, knee 01/31/2012    Priority: Low  . Allergic rhinitis 10/28/2011    Priority: Low  . Hearing loss 06/08/2011    Priority: Low  . Primary osteoarthritis of hand 06/08/2011    Priority: Low  . Hematuria 08/27/2008    Priority: Low  . History of revision of total replacement of right hip joint 01/03/2018  . Leukocytosis 06/02/2016  . Carotid stenosis 06/02/2016   Current Meds  Medication Sig  . apixaban (ELIQUIS) 5 MG TABS tablet Take 1 tablet (5 mg total) by mouth 2  (two) times daily.  [name] atorvastatin (LIPITOR) 40 MG tablet Take 1 tablet (40 mg total) by mouth daily at 6 PM.  . AVODART 0.5 MG capsule Take 0.5 mg by mouth every other day.   . diltiazem (CARDIZEM CD) 360 MG 24 hr capsule Take 1 capsule (360 mg total) by mouth daily.  Regino Schultze Bandages & Supports (KNEE BRACE ADJUSTABLE HINGED) MISC Wear daily as tolerated  . empagliflozin (JARDIANCE) 10 MG TABS tablet Take 10 mg by mouth daily.  [name] gabapentin (NEURONTIN) 300 MG capsule Take 1 capsule (300 mg total) by mouth 3 (three) times daily.  . insulin glargine (LANTUS) 100 UNIT/ML injection Inject 35 Units into the skin daily.  . meclizine (ANTIVERT) 25 MG tablet Take 1 tablet (25 mg total) by mouth 3 (three) times daily as needed for dizziness.  . metFORMIN (GLUCOPHAGE) 500 MG tablet Take 500 mg by mouth 2 (two) times daily with a meal.  . metoprolol succinate (TOPROL-XL) 100 MG 24 hr tablet TAKE 1 TABLET BY MOUTH TWICE DAILY  . nitroGLYCERIN (NITROSTAT) 0.4 MG SL tablet Place 0.4 mg under the tongue every 5 (five) minutes as needed for chest pain (x 3 doses).  . pantoprazole (PROTONIX) 40 MG tablet TAKE 1 TABLET BY MOUTH DAILY AT NOON  . PRECISION XTRA TEST STRIPS test strip TEST BLOOD SUGAR 1-2 TIMES DAILY  . Tamsulosin HCl (FLOMAX) 0.4 MG CAPS Take 0.4 mg by mouth every other day.     Allergies: Patient is allergic to erythromycin base; amoxicillin; cefadroxil; ciprofloxacin; and erythromycin. Family History: Patient family history includes Brain cancer in his father; Throat cancer in his brother. Social History:  Patient  reports that he quit smoking about 60 years ago. His smoking use included cigarettes. He has a 30.00 pack-year smoking history. He has never used smokeless tobacco. He reports that he does not drink alcohol or use drugs.  Review of Systems: Constitutional: Negative for fever malaise or anorexia Cardiovascular: negative for chest pain Respiratory: negative for SOB or persistent  cough Gastrointestinal: negative for abdominal pain  Objective  Vitals: BP 122/88   Pulse 69   Temp (!) 97.2 F (36.2 C) (Tympanic)   Resp 16   Ht 5\' 6"  (1.676 m)   Wt 199 lb (90.3 kg)   SpO2 99%   BMI 32.12 kg/m  General: antalgic giat, flat affect due to pain , A&Ox3 HEENT: PEERL, conjunctiva normal, Oropharynx moist,neck is supple Cardiovascular:  RRR without murmur or gallop.  Respiratory:  Good breath sounds bilaterally, CTAB with normal respiratory effort Skin:  Warm, no rashes MSK: neg SLR bilateral, nl strength in quads but painful on right. Tender on right hip bursa.  Neuro: + 2 knee reflex bilaterally;   GR Trochanteric Bursa steroid injection  Procedure Note  Pre-operative Diagnosis: right hip bursitis   Post-operative Diagnosis: same   Indications: pain   Anesthesia: cold spray Steroid: 1cc kenalog 10mg  Lidocaine: 30cc   Procedure Details    Verbal consent was obtained for the procedure. Universal time out done. The point of maximum tenderness was identified and marked over the hip bursa. The skin prepped with alcohol and cold spray used for anesthesia. A needle was advanced into the bursa and the steroid/lido was administered easily.    Complications:  None; patient tolerated the procedure well.     Commons side effects, risks, benefits, and alternatives for medications and treatment plan prescribed today were discussed, and the patient expressed understanding of the given instructions. Patient is instructed to call or message via MyChart if he/she has any questions or concerns regarding our treatment plan. No barriers to understanding were identified. We discussed Red Flag symptoms and signs in detail. Patient expressed understanding regarding what to do in case of urgent or emergency type symptoms.   Medication list was reconciled, printed and provided to the patient in AVS. Patient instructions and summary information was reviewed with the patient as  documented in the AVS. This note was prepared with assistance of Dragon voice recognition software. Occasional wrong-word or sound-a-like substitutions may have occurred due to the inherent limitations of voice recognition software

## 2018-11-21 ENCOUNTER — Telehealth: Payer: Self-pay | Admitting: *Deleted

## 2018-11-21 ENCOUNTER — Telehealth: Payer: Self-pay | Admitting: Family Medicine

## 2018-11-21 NOTE — Progress Notes (Signed)
Please call patient: I have reviewed his/her lab results. Please let pt know that his hip xray shows a possible abnormality: I'd like Dr. Maureen Ralphs to look at the film and see if he needs to be evaluated. Please forward report to Dr. Maureen Ralphs. I think he has access to the films. Ask if he wants to see [name] for this.  The back films do not show any new bony problems. IF Alusio doesn't think pain is from hip problem, I will refer to Neurosurgery.   Also, his aorta is enlarged. I will follow up with him on this after we get him feeling better.

## 2018-11-21 NOTE — Telephone Encounter (Signed)
Mr. Gadbois aware of results and verbalize understanding. He states he will be ok with neuro referral if Dr. Maureen Ralphs thinks that's the next step.  Notes recorded by [name], MD on 11/21/2018 at 12:15 PM EDT  Please call patient: I have reviewed his/her lab results. Please let pt know that his hip xray shows a possible abnormality: I'd like Dr. Maureen Ralphs to look at the film and see if he needs to be evaluated. Please forward report to Dr. Maureen Ralphs. I think he has access to the films. Ask if he wants to see [name] for this.  The back films do not show any new bony problems. IF Alusio doesn't think pain is from hip problem, I will refer to Neurosurgery.   Also, his aorta is enlarged. I will follow up with him on this after we get him feeling better.

## 2018-11-21 NOTE — Telephone Encounter (Signed)
FYI

## 2018-11-21 NOTE — Telephone Encounter (Signed)
'  [name]' from [facility] calling with "Stat" results of lumbar spine from yesterday. Results are available in Epic  [place] called practice, Madelyn, to alert.

## 2018-11-24 ENCOUNTER — Telehealth: Payer: Self-pay | Admitting: Physical Therapy

## 2018-11-24 MED ORDER — OXYCODONE-ACETAMINOPHEN 10-325 MG PO TABS: 1.0000 | ORAL_TABLET | Freq: Three times a day (TID) | ORAL | 0 refills | Status: DC | PRN

## 2018-11-24 NOTE — Telephone Encounter (Signed)
Copied from [place]. Topic: General - Other >> Nov 24, 2018  8:23 AM Celene Kras A wrote: Reason for CRM: Pt called stating he is still having a lot of pain in his hip. Pt states he has been icing hip as advised and that helps some, but not enough. Pt states he is concerned that the weekend is coming up and he has no relief for the pain. Pt states he has used his oxycodone. Please advise.

## 2018-11-24 NOTE — Addendum Note (Signed)
Addended by: Billey Chang on: 11/24/2018 10:08 AM   Modules accepted: Orders

## 2018-11-24 NOTE — Telephone Encounter (Signed)
Refilled meds

## 2018-11-24 NOTE — Telephone Encounter (Signed)
Pt is aware he will be hearing from Dr. Maureen Ralphs office to get worked in, he is asking for Oxycodone refill.

## 2018-11-24 NOTE — Telephone Encounter (Signed)
Called pt and Dr. Maureen Ralphs, pt is aware he will hear back from [place] or Dr. Maureen Ralphs to get seen

## 2018-11-30 DIAGNOSIS — Z96641 Presence of right artificial hip joint: Secondary | ICD-10-CM | POA: Diagnosis not present

## 2018-11-30 DIAGNOSIS — M7061 Trochanteric bursitis, right hip: Secondary | ICD-10-CM | POA: Diagnosis not present

## 2018-12-04 ENCOUNTER — Other Ambulatory Visit: Payer: Self-pay

## 2018-12-04 ENCOUNTER — Ambulatory Visit (INDEPENDENT_AMBULATORY_CARE_PROVIDER_SITE_OTHER): Payer: Medicare Other | Admitting: Family Medicine

## 2018-12-04 ENCOUNTER — Encounter: Payer: Self-pay | Admitting: Family Medicine

## 2018-12-04 VITALS — BP 126/74 | HR 110 | Temp 97.6°F | Resp 16 | Wt 190.0 lb

## 2018-12-04 DIAGNOSIS — M48062 Spinal stenosis, lumbar region with neurogenic claudication: Secondary | ICD-10-CM | POA: Diagnosis not present

## 2018-12-04 MED ORDER — PREDNISONE 10 MG PO TABS: ORAL_TABLET | ORAL | 0 refills | Status: DC

## 2018-12-04 MED ORDER — OXYCODONE-ACETAMINOPHEN 10-325 MG PO TABS: 1.0000 | ORAL_TABLET | Freq: Two times a day (BID) | ORAL | 0 refills | Status: DC | PRN

## 2018-12-04 NOTE — Patient Instructions (Signed)
Please follow up if symptoms do not improve or as needed.   I've placed a referral to [facility].   I've ordered prednisone and oxycodone for you.   You may try zyrtec for your nasal drainage. Take it at night.

## 2018-12-04 NOTE — Progress Notes (Signed)
Subjective  CC:  Chief Complaint  Patient presents with  . Leg Pain    Right leg pain.. Saw Alusio on 11/30/18, taking Gabapentin TID and Oxycodone  at night with minimal relief    HPI: [name] is a 85 y.o. male who presents to the office today to address the problems listed above in the chief complaint.  Worsening pain; does well if sits or if lies back in recliner. Worse pain with standing erect or lying flat in bed. Has been suffering intermittently for some time now. Had lumbar MRI in 12/2017 with reports of spinal stenosis and DJD but records are at [facility]; failed mgt with epidural steroid injections by Dr. Nelva Bush. Recently saw Dr. Maureen Ralphs again for hip pain and dxd with bursitis but injection did not help pain.   Reports radicular sxs. Needing oxycodone to help sleep due to pain. Activity is very limited now due to pain with standing. On gabapentin but no longer helping.    Assessment  1. Spinal stenosis of lumbar region with neurogenic claudication      Plan   pain:  I believe this is due to spinal stenosis: I recommend a neurosurgical evaluation for further recs on management. For now, try a round of prednisone and continue oxycodone. Referral placed.   Follow up: No follow-ups on file.  02/06/2019  Orders Placed This Encounter  Procedures  . Ambulatory referral to Neurosurgery   Meds ordered this encounter  Medications  . predniSONE (DELTASONE) 10 MG tablet    Sig: Take 4 tabs qd x 2 days, 3 qd x 2 days, 2 qd x 2d, 1qd x 3 days    Dispense:  21 tablet    Refill:  0  . oxyCODONE-acetaminophen (PERCOCET) 10-325 MG tablet    Sig: Take 1 tablet by mouth 2 (two) times daily as needed for pain.    Dispense:  60 tablet    Refill:  0      I reviewed the patients updated PMH, FH, and SocHx.    Patient Active Problem List   Diagnosis Date Noted  . Spinal stenosis of lumbar region 02/21/2018    Priority: High  . Diabetic peripheral neuropathy associated  with type 2 diabetes mellitus ([place]) 05/05/2017    Priority: High  . Atherosclerotic heart disease of native coronary artery without angina pectoris 06/13/2016    Priority: High  . CVA (cerebral vascular accident) ([place]) 06/06/2016    Priority: High  . Recurrent coronary arteriosclerosis after percutaneous transluminal coronary angioplasty 04/18/2015    Priority: High  . CAD S/P PCI- Nov 2016     Priority: High  . Current use of long term anticoagulation 02/06/2013    Priority: High  . Chronic atrial fibrillation ([place]) 07/30/2011    Priority: High  . Hx of CABG x 2 1990     Priority: High  . Essential (primary) hypertension     Priority: High  . Diabetes mellitus type 2, insulin dependent ([place])     Priority: High  . Hyperlipemia     Priority: High  . Degeneration of lumbar intervertebral disc 02/21/2018    Priority: Medium  . Chronic vertigo 03/15/2014    Priority: Medium  . Obesity (BMI 30.0-34.9) 01/31/2012    Priority: Medium  . Enlarged prostate without lower urinary tract symptoms (luts) 01/04/2011    Priority: Medium  . Gastro-esophageal reflux disease without esophagitis 12/07/2010    Priority: Medium  . Epistaxis, recurrent 04/11/2015    Priority: Low  .  Osteoarthritis, knee 01/31/2012    Priority: Low  . Allergic rhinitis 10/28/2011    Priority: Low  . Hearing loss 06/08/2011    Priority: Low  . Primary osteoarthritis of hand 06/08/2011    Priority: Low  . Hematuria 08/27/2008    Priority: Low  . History of revision of total replacement of right hip joint 01/03/2018  . Leukocytosis 06/02/2016  . Carotid stenosis 06/02/2016   Current Meds  Medication Sig  . amoxicillin (AMOXIL) 500 MG capsule Take 4 caps by mouth 1 hours prior to dental procedure  . apixaban (ELIQUIS) 5 MG TABS tablet Take 1 tablet (5 mg total) by mouth 2 (two) times daily.  [name] atorvastatin (LIPITOR) 40 MG tablet Take 1 tablet (40 mg total) by mouth daily at 6 PM.  . AVODART 0.5 MG capsule  Take 0.5 mg by mouth every other day.   . diltiazem (CARDIZEM CD) 360 MG 24 hr capsule Take 1 capsule (360 mg total) by mouth daily.  Regino Schultze Bandages & Supports (KNEE BRACE ADJUSTABLE HINGED) MISC Wear daily as tolerated  . empagliflozin (JARDIANCE) 10 MG TABS tablet Take 10 mg by mouth daily.  [name] gabapentin (NEURONTIN) 300 MG capsule Take 1 capsule (300 mg total) by mouth 3 (three) times daily.  . insulin glargine (LANTUS) 100 UNIT/ML injection Inject 35 Units into the skin daily.  . meclizine (ANTIVERT) 25 MG tablet Take 1 tablet (25 mg total) by mouth 3 (three) times daily as needed for dizziness.  . metFORMIN (GLUCOPHAGE) 500 MG tablet Take 500 mg by mouth 2 (two) times daily with a meal.  . metoprolol succinate (TOPROL-XL) 100 MG 24 hr tablet TAKE 1 TABLET BY MOUTH TWICE DAILY  . nitroGLYCERIN (NITROSTAT) 0.4 MG SL tablet Place 0.4 mg under the tongue every 5 (five) minutes as needed for chest pain (x 3 doses).  . pantoprazole (PROTONIX) 40 MG tablet TAKE 1 TABLET BY MOUTH DAILY AT NOON  . PRECISION XTRA TEST STRIPS test strip TEST BLOOD SUGAR 1-2 TIMES DAILY  . Tamsulosin HCl (FLOMAX) 0.4 MG CAPS Take 0.4 mg by mouth every other day.     Allergies: Patient is allergic to erythromycin base; amoxicillin; cefadroxil; ciprofloxacin; and erythromycin. Family History: Patient family history includes Brain cancer in his father; Throat cancer in his brother. Social History:  Patient  reports that he quit smoking about 60 years ago. His smoking use included cigarettes. He has a 30.00 pack-year smoking history. He has never used smokeless tobacco. He reports that he does not drink alcohol or use drugs.  Review of Systems: Constitutional: Negative for fever malaise or anorexia Cardiovascular: negative for chest pain Respiratory: negative for SOB or persistent cough Gastrointestinal: negative for abdominal pain  Objective  Vitals: BP 126/74   Pulse (!) 110   Temp 97.6 F (36.4 C)  (Tympanic)   Resp 16   Wt 190 lb (86.2 kg)   SpO2 98%   BMI 30.67 kg/m  General: no acute distress , A&Ox3 Using walker to help with ambulation     Commons side effects, risks, benefits, and alternatives for medications and treatment plan prescribed today were discussed, and the patient expressed understanding of the given instructions. Patient is instructed to call or message via MyChart if he/she has any questions or concerns regarding our treatment plan. No barriers to understanding were identified. We discussed Red Flag symptoms and signs in detail. Patient expressed understanding regarding what to do in case of urgent or emergency type symptoms.  Medication list was reconciled, printed and provided to the patient in AVS. Patient instructions and summary information was reviewed with the patient as documented in the AVS. This note was prepared with assistance of Dragon voice recognition software. Occasional wrong-word or sound-a-like substitutions may have occurred due to the inherent limitations of voice recognition software

## 2018-12-08 ENCOUNTER — Telehealth: Payer: Self-pay

## 2018-12-08 NOTE — Telephone Encounter (Signed)
I called pt back and explained that he needed to sign the record of release form in order to keep appointment w/ NeuroSurgery on 12/13/18.

## 2018-12-08 NOTE — Telephone Encounter (Signed)
I spoke w/pt and he informed me that [facility] told him that they  needed copies of injections and MRI, from [facility], Dr.Ramos.  I called [facility] and asked them to send his records to Neuro Surgery.  I was told that pt needed to sign a record of release form from there office in order for them to send information.  I informed Yvetta Coder, from [facility] that pt had no means of getting there to sign, due to health issues.  I asked her to please mail them to pt so he could sign and mail back.

## 2018-12-08 NOTE — Telephone Encounter (Signed)
Copied from [place] [phone]. Topic: Referral - Question >> Dec 08, 2018  9:04 AM Berneta Levins wrote: Reason for CRM:   Pt states that he needs to speak with Dr. Tamela Oddi CMA regarding a neurosurgeon referral that he received. Pt can be reached at [phone]

## 2018-12-13 DIAGNOSIS — Z6831 Body mass index (BMI) 31.0-31.9, adult: Secondary | ICD-10-CM | POA: Diagnosis not present

## 2018-12-13 DIAGNOSIS — M25551 Pain in right hip: Secondary | ICD-10-CM | POA: Diagnosis not present

## 2018-12-13 DIAGNOSIS — I10 Essential (primary) hypertension: Secondary | ICD-10-CM | POA: Diagnosis not present

## 2018-12-21 ENCOUNTER — Ambulatory Visit (INDEPENDENT_AMBULATORY_CARE_PROVIDER_SITE_OTHER): Payer: Medicare Other | Admitting: Family Medicine

## 2018-12-21 ENCOUNTER — Encounter: Payer: Self-pay | Admitting: Family Medicine

## 2018-12-21 ENCOUNTER — Other Ambulatory Visit: Payer: Self-pay

## 2018-12-21 VITALS — BP 128/80 | HR 86 | Temp 98.2°F | Resp 16 | Ht 66.0 in | Wt 194.0 lb

## 2018-12-21 DIAGNOSIS — M25551 Pain in right hip: Secondary | ICD-10-CM

## 2018-12-21 NOTE — Progress Notes (Signed)
Subjective  CC:  Chief Complaint  Patient presents with  . Back Pain    HPI: [name] is a 85 y.o. male who presents to the office today to address the problems listed above in the chief complaint.  F/u right hip pain; reviewed evaluation by Dr. Eppie Gibson [facility]; not related to Lumbar stenosis but due to right hip:  Tender over right ischial tuberosity and lateral gr troch bursa with decreased ROM. Neg h/o radicular sxs or claudication. He recommended referral to Dr. Erlinda Hong, ortho, for a second opinion as Dr. Maureen Ralphs has not been able to help.  However, he is now doing much better on the oxycodone bid. No constipation or negative side effects.   Wants to know what next. Hasn't yet heard from [facility] about referral.   Assessment  1. Right hip pain      Plan   Hip pain:  Wean oxycodone. Trial of hydrocodone if does ok. Will await referral to Dr. Erlinda Hong.   Follow up:   02/06/2019  No orders of the defined types were placed in this encounter.  No orders of the defined types were placed in this encounter.     I reviewed the patients updated PMH, FH, and SocHx.    Patient Active Problem List   Diagnosis Date Noted  . Spinal stenosis of lumbar region 02/21/2018    Priority: High  . Diabetic peripheral neuropathy associated with type 2 diabetes mellitus ([place]) 05/05/2017    Priority: High  . Atherosclerotic heart disease of native coronary artery without angina pectoris 06/13/2016    Priority: High  . CVA (cerebral vascular accident) ([place]) 06/06/2016    Priority: High  . Recurrent coronary arteriosclerosis after percutaneous transluminal coronary angioplasty 04/18/2015    Priority: High  . CAD S/P PCI- Nov 2016     Priority: High  . Current use of long term anticoagulation 02/06/2013    Priority: High  . Chronic atrial fibrillation ([place]) 07/30/2011    Priority: High  . Hx of CABG x 2 1990     Priority: High  . Essential (primary) hypertension     Priority: High  . Diabetes  mellitus type 2, insulin dependent ([place])     Priority: High  . Hyperlipemia     Priority: High  . Degeneration of lumbar intervertebral disc 02/21/2018    Priority: Medium  . Chronic vertigo 03/15/2014    Priority: Medium  . Obesity (BMI 30.0-34.9) 01/31/2012    Priority: Medium  . Enlarged prostate without lower urinary tract symptoms (luts) 01/04/2011    Priority: Medium  . Gastro-esophageal reflux disease without esophagitis 12/07/2010    Priority: Medium  . Epistaxis, recurrent 04/11/2015    Priority: Low  . Osteoarthritis, knee 01/31/2012    Priority: Low  . Allergic rhinitis 10/28/2011    Priority: Low  . Hearing loss 06/08/2011    Priority: Low  . Primary osteoarthritis of hand 06/08/2011    Priority: Low  . Hematuria 08/27/2008    Priority: Low  . History of revision of total replacement of right hip joint 01/03/2018  . Leukocytosis 06/02/2016  . Carotid stenosis 06/02/2016   No outpatient medications have been marked as taking for the 12/21/18 encounter (Office Visit) with [name], MD.    Allergies: Patient is allergic to erythromycin base; amoxicillin; cefadroxil; ciprofloxacin; and erythromycin. Family History: Patient family history includes Brain cancer in his father; Throat cancer in his brother. Social History:  Patient  reports that he quit smoking about  60 years ago. His smoking use included cigarettes. He has a 30.00 pack-year smoking history. He has never used smokeless tobacco. He reports that he does not drink alcohol or use drugs.  Review of Systems: Constitutional: Negative for fever malaise or anorexia Cardiovascular: negative for chest pain Respiratory: negative for SOB or persistent cough Gastrointestinal: negative for abdominal pain  Objective  Vitals: BP 128/80   Pulse 86   Temp 98.2 F (36.8 C) (Tympanic)   Resp 16   Ht 5\' 6"  (1.676 m)   Wt 194 lb (88 kg)   SpO2 96%   BMI 31.31 kg/m  General: no acute distress , A&Ox3  Looks more comfortable.  Walking without cane now.      Commons side effects, risks, benefits, and alternatives for medications and treatment plan prescribed today were discussed, and the patient expressed understanding of the given instructions. Patient is instructed to call or message via MyChart if he/she has any questions or concerns regarding our treatment plan. No barriers to understanding were identified. We discussed Red Flag symptoms and signs in detail. Patient expressed understanding regarding what to do in case of urgent or emergency type symptoms.   Medication list was reconciled, printed and provided to the patient in AVS. Patient instructions and summary information was reviewed with the patient as documented in the AVS. This note was prepared with assistance of Dragon voice recognition software. Occasional wrong-word or sound-a-like substitutions may have occurred due to the inherent limitations of voice recognition software

## 2018-12-27 ENCOUNTER — Telehealth: Payer: Self-pay | Admitting: Family Medicine

## 2018-12-27 ENCOUNTER — Telehealth: Payer: Self-pay

## 2018-12-27 DIAGNOSIS — M7061 Trochanteric bursitis, right hip: Secondary | ICD-10-CM

## 2018-12-27 DIAGNOSIS — M25551 Pain in right hip: Secondary | ICD-10-CM

## 2018-12-27 NOTE — Telephone Encounter (Signed)
The neurosurgeon referral pt to Dr. Erlinda Hong, he needs to contact them.

## 2018-12-27 NOTE — Telephone Encounter (Signed)
Copied from [place] [phone]. Topic: General - Other >> Dec 27, 2018 12:21 PM Alanda Slim E wrote: Reason for CRM: Pt was advised to call if he hasnt heard anything in a weeks time from Dr. Donnamarie Poag / Pt stated he has not heard from Dr. Donnamarie Poag and wants to know what he needs to do/ please advise

## 2018-12-27 NOTE — Telephone Encounter (Signed)
He called inquiring about the referral to Dr Erlinda Hong for his hip.  I don't see an Ortho referral in for him. Please advise.  I already spoke to him, so there is no need to call him.  Just place the referral, if that was the plan, and I will get it sent.  Thanks.

## 2018-12-27 NOTE — Telephone Encounter (Signed)
Who is Dr Donnamarie Poag??

## 2019-01-02 NOTE — Telephone Encounter (Signed)
Order placed, please try to get him in ASAP, not ordered as STAT tho

## 2019-01-02 NOTE — Telephone Encounter (Signed)
You're the absolute best Tiara!

## 2019-01-02 NOTE — Addendum Note (Signed)
Addended by: Layla Barter on: 01/02/2019 03:19 PM   Modules accepted: Orders

## 2019-01-02 NOTE — Telephone Encounter (Signed)
I just spoke to him.  Apparently, it was only suggested that he see Ortho ( Dr Erlinda Hong ) because they told him it was his hip and not his back.  They didn't say that they were putting in a referral - he was to talk to his PCP about it and go from there.

## 2019-01-09 ENCOUNTER — Encounter: Payer: Self-pay | Admitting: Orthopaedic Surgery

## 2019-01-09 ENCOUNTER — Other Ambulatory Visit: Payer: Self-pay

## 2019-01-09 ENCOUNTER — Ambulatory Visit (INDEPENDENT_AMBULATORY_CARE_PROVIDER_SITE_OTHER): Payer: Medicare Other | Admitting: Orthopaedic Surgery

## 2019-01-09 VITALS — Ht 66.0 in | Wt 190.0 lb

## 2019-01-09 DIAGNOSIS — M25559 Pain in unspecified hip: Secondary | ICD-10-CM | POA: Diagnosis not present

## 2019-01-09 DIAGNOSIS — I6523 Occlusion and stenosis of bilateral carotid arteries: Secondary | ICD-10-CM | POA: Diagnosis not present

## 2019-01-09 NOTE — Progress Notes (Signed)
Office Visit Note   Patient: [name]           Date of Birth: 02/01/1929           MRN: [id number] Visit Date: 01/09/2019              Requested by: [name], [name],  [place] [id number] PCP: Jeffrey Arnt, MD   Assessment & Plan: Visit Diagnoses:  1. Hip pain     Plan: Impression is chronic right hip abductor tendinopathy and attritional tearing.  I reviewed the x-rays of his right hip replacement from September and does show polyethylene wear.  However I think his symptoms are more consistent with abductor tendinopathy.  He had relief from prior injections but they no longer work.  I do think that physical therapy is likely to improve his pain and help strengthen his hip.  He will continue to take oxycodone at night for the pain which his PCP has been prescribing.  Questions encouraged and answered.  Follow-up as needed.  Follow-Up Instructions: Return if symptoms worsen or fail to improve.   Orders:  Orders Placed This Encounter  Procedures  . Ambulatory referral to Physical Therapy   No orders of the defined types were placed in this encounter.     Procedures: No procedures performed   Clinical Data: No additional findings.   Subjective: Chief Complaint  Patient presents with  . Right Hip - Pain    [name] is a very pleasant 85 year old gentleman presents today with his wife for evaluation of chronic right hip pain.  He states that 90% of his pain is localized to the lateral side.  Occasionally has some pain in his right hip region.  He is status post a right total hip replacement about 20 years ago by [name].  He has in the last year received multiple injections in his back and right hip.  He has also seen a neurosurgeon for evaluation of his back problems ruled out for this.  He comes in today for further evaluation and treatment.  He has not had any physical therapy.  He takes oxycodone at night for the pain.  He  ambulates with a cane.   Review of Systems  Constitutional: Negative.   All other systems reviewed and are negative.    Objective: Vital Signs: Ht 5\' 6"  (1.676 m)   Wt 190 lb (86.2 kg)   BMI 30.67 kg/m   Physical Exam Vitals signs and nursing note reviewed.  Constitutional:      Appearance: He is well-developed.  HENT:     Head: Normocephalic and atraumatic.  Eyes:     Pupils: Pupils are equal, round, and reactive to light.  Neck:     Musculoskeletal: Neck supple.  Pulmonary:     Effort: Pulmonary effort is normal.  Abdominal:     Palpations: Abdomen is soft.  Musculoskeletal: Normal range of motion.  Skin:    General: Skin is warm.  Neurological:     Mental Status: He is alert and oriented to person, place, and time.  Psychiatric:        Behavior: Behavior normal.        Thought Content: Thought content normal.        Judgment: Judgment normal.     Ortho Exam Right lower extremity and right hip exam shows moderate discomfort with external rotation of the hip.  He has no significant tenderness on the lateral hip.  Negative  sciatic tension signs.  No motor focal motor or sensory deficits.  He has pain with single leg stance of the right leg. Specialty Comments:  No specialty comments available.  Imaging: No results found.   PMFS History: Patient Active Problem List   Diagnosis Date Noted  . Hip pain 01/09/2019  . Spinal stenosis of lumbar region 02/21/2018  . Degeneration of lumbar intervertebral disc 02/21/2018  . History of revision of total replacement of right hip joint 01/03/2018  . Diabetic peripheral neuropathy associated with type 2 diabetes mellitus ([place]) 05/05/2017  . Atherosclerotic heart disease of native coronary artery without angina pectoris 06/13/2016  . CVA (cerebral vascular accident) ([place]) 06/06/2016  . Leukocytosis 06/02/2016  . Carotid stenosis 06/02/2016  . Recurrent coronary arteriosclerosis after percutaneous transluminal coronary  angioplasty 04/18/2015  . Epistaxis, recurrent 04/11/2015  . CAD S/P PCI- Nov 2016   . Chronic vertigo 03/15/2014  . Current use of long term anticoagulation 02/06/2013  . Obesity (BMI 30.0-34.9) 01/31/2012  . Osteoarthritis, knee 01/31/2012  . Allergic rhinitis 10/28/2011  . Chronic atrial fibrillation ([place]) 07/30/2011  . Hearing loss 06/08/2011  . Primary osteoarthritis of hand 06/08/2011  . Enlarged prostate without lower urinary tract symptoms (luts) 01/04/2011  . Gastro-esophageal reflux disease without esophagitis 12/07/2010  . Hx of CABG x 2 1990   . Essential (primary) hypertension   . Diabetes mellitus type 2, insulin dependent ([place])   . Hyperlipemia   . Hematuria 08/27/2008   Past Medical History:  Diagnosis Date  . Abdominal pain   . Acute renal failure ([place])     resolved  . Arthritis    "hands & legs" (11/'10/2014)  . Ataxia   . Atrial fibrillation ([place])   . Bladder outlet obstruction   . Bladder outlet obstruction   . BPH (benign prostatic hyperplasia)   . CAD (coronary artery disease)    a. CABG IN 1989. b. 01/08/2015 CTO of ost LAD, LIMA to LAD not visualized but assumed patent given myoview finding, occluded SVG to diagonal, 99% mid RCA tx w/ SYNERGY DES 3X28 mm  . Constipation   . Diabetes mellitus type 2, insulin dependent ([place])   . Epistaxis, recurrent Feb 2017  . GERD (gastroesophageal reflux disease)   . HTN (hypertension)   . Hx of bacterial pneumonia   . Hyperlipemia   . Kidney stones   . Mild aortic stenosis   . Rib fractures    left rib fractures being treated with pain medications  . Stented coronary artery Nov 2016   RCA DES  . Urinary tract infection     Enterococcus    Family History  Problem Relation Age of Onset  . Brain cancer Father   . Throat cancer Brother     Past Surgical History:  Procedure Laterality Date  . CARDIAC CATHETERIZATION  1989  . CARDIAC CATHETERIZATION N/A 01/08/2015   Procedure: Left Heart Cath and Cors/Grafts  Angiography;  Surgeon: Jeffrey M Martinique, MD;  Location: [facility];  Service: Cardiovascular;  Laterality: N/A;  . CARDIAC CATHETERIZATION  01/08/2015   Procedure: Coronary Stent Intervention;  Surgeon: Jeffrey M Martinique, MD;  Location: [facility];  Service: Cardiovascular;;  . CARPAL TUNNEL RELEASE Right 08/2010  . CATARACT EXTRACTION W/ INTRAOCULAR LENS  IMPLANT, BILATERAL Bilateral   . CORONARY ANGIOPLASTY  01/08/15   RCA DES  . CORONARY ARTERY BYPASS GRAFT  1989   "CABG X 2"  . JOINT REPLACEMENT    . TONSILLECTOMY    .  TOTAL HIP ARTHROPLASTY Right 2000   Social History   Occupational History  . Occupation: Agricultural consultant  Tobacco Use  . Smoking status: Former Smoker    Packs/day: 3.00    Years: 10.00    Pack years: 30.00    Types: Cigarettes    Quit date: 05/11/1958    Years since quitting: 60.7  . Smokeless tobacco: Never Used  Substance and Sexual Activity  . Alcohol use: No  . Drug use: No  . Sexual activity: Not Currently

## 2019-01-13 NOTE — Progress Notes (Signed)
Cardiology Office Note   Date:  01/15/2019   ID:  [name], DOB [birth year], MRN [id number]  PCP:  [name], MD  Cardiologist: Dr. Martinique CC: Follow  Up    History of Present Illness: [name] is a 85 y.o. male who presents for ongoing assessment and management of chronic atrial fibrillation on Eliquis, coronary artery disease status post CABG in 1990, repeat heart catheterization 2006 revealing occluded proximal LAD and critical stenosis of the RCA, occluded SVG-diagonal, and patent LIMA to LAD.  DES to the mid RCA.  The patient was continued on Plavix.  Other history includes CVA in April 2018 with imaging revealing 60% right carotid stenosis.  INR was subtherapeutic so the patient was switched to Eliquis from Coumadin.  The patient was last seen in the office on 10/04/2018 by Fabian Sharp, PA for complaints of lower extremity edema which started 1 to 2 weeks prior to being seen with a weight gain of 10 pounds.  He was noted to have chronic hip pain receiving injections to his hip, and sleeps in a recliner due to back and hip pain. On that office visit the patient was started on Lasix 40 mg with potassium 20 mEq.  He was advised to do daily weights and a low-sodium diet.  He had a good response to lasix with loss of 11 lbs and resolution of his edema.   Echocardiogram completed on 10/10/2018 revealed normal LV systolic function of 55 to 60% but could not evaluate end-diastolic pressure due to atrial fib.  He was noted to have a tricuspid aortic valve with severe thickening of the aortic valve with severe calcification of the aortic valve.  Aortic regurg was trivial, moderate stenosis of the aortic valve with moderate aortic annular calcification.  A V-max 232.4 cm, AV mean gradient 13.0 mmHg, the AV appear to be at least moderately stenosed.  He was also found to have mild to moderate mitral annular calcification with no evidence of stenosis.  On follow up today he still has hip  pain. Takes oxycodone at night and is able to sleep well. Walks with a cane. Denies any chest pain, SOB, palpitations. Edema has resolved and weight at home is stable at 190 lbs.     Past Medical History:  Diagnosis Date  . Abdominal pain   . Acute renal failure ([place])     resolved  . Arthritis    "hands & legs" (11/'10/2014)  . Ataxia   . Atrial fibrillation ([place])   . Bladder outlet obstruction   . Bladder outlet obstruction   . BPH (benign prostatic hyperplasia)   . CAD (coronary artery disease)    a. CABG IN 1989. b. 01/08/2015 CTO of ost LAD, LIMA to LAD not visualized but assumed patent given myoview finding, occluded SVG to diagonal, 99% mid RCA tx w/ SYNERGY DES 3X28 mm  . Constipation   . Diabetes mellitus type 2, insulin dependent ([place])   . Epistaxis, recurrent Feb 2017  . GERD (gastroesophageal reflux disease)   . HTN (hypertension)   . Hx of bacterial pneumonia   . Hyperlipemia   . Kidney stones   . Mild aortic stenosis   . Rib fractures    left rib fractures being treated with pain medications  . Stented coronary artery Nov 2016   RCA DES  . Urinary tract infection     Enterococcus    Past Surgical History:  Procedure Laterality Date  . CARDIAC CATHETERIZATION  1989  .  CARDIAC CATHETERIZATION N/A 01/08/2015   Procedure: Left Heart Cath and Cors/Grafts Angiography;  Surgeon:  M Martinique, MD;  Location: [facility];  Service: Cardiovascular;  Laterality: N/A;  . CARDIAC CATHETERIZATION  01/08/2015   Procedure: Coronary Stent Intervention;  Surgeon:  M Martinique, MD;  Location: [facility];  Service: Cardiovascular;;  . CARPAL TUNNEL RELEASE Right 08/2010  . CATARACT EXTRACTION W/ INTRAOCULAR LENS  IMPLANT, BILATERAL Bilateral   . CORONARY ANGIOPLASTY  01/08/15   RCA DES  . CORONARY ARTERY BYPASS GRAFT  1989   "CABG X 2"  . JOINT REPLACEMENT    . TONSILLECTOMY    . TOTAL HIP ARTHROPLASTY Right 2000     Current Outpatient Medications   Medication Sig Dispense Refill  . amoxicillin (AMOXIL) 500 MG capsule Take 4 caps by mouth 1 hours prior to dental procedure 8 capsule 0  . apixaban (ELIQUIS) 5 MG TABS tablet Take 1 tablet (5 mg total) by mouth 2 (two) times daily. 60 tablet 2  . atorvastatin (LIPITOR) 40 MG tablet Take 1 tablet (40 mg total) by mouth daily at 6 PM. 30 tablet 2  . AVODART 0.5 MG capsule Take 0.5 mg by mouth every other day.     . diltiazem (CARDIZEM CD) 360 MG 24 hr capsule Take 1 capsule (360 mg total) by mouth daily. 30 capsule 6  . Elastic Bandages & Supports (KNEE BRACE ADJUSTABLE HINGED) MISC Wear daily as tolerated 1 each 0  . empagliflozin (JARDIANCE) 10 MG TABS tablet Take 10 mg by mouth daily.    . furosemide (LASIX) 40 MG tablet Take 40 mg by mouth.    . gabapentin (NEURONTIN) 300 MG capsule Take 1 capsule (300 mg total) by mouth 3 (three) times daily. 270 capsule 3  . insulin glargine (LANTUS) 100 UNIT/ML injection Inject 35 Units into the skin daily.    . meclizine (ANTIVERT) 25 MG tablet Take 1 tablet (25 mg total) by mouth 3 (three) times daily as needed for dizziness. 30 tablet 1  . metFORMIN (GLUCOPHAGE) 500 MG tablet Take 500 mg by mouth 2 (two) times daily with a meal.    . metoprolol succinate (TOPROL-XL) 100 MG 24 hr tablet TAKE 1 TABLET BY MOUTH TWICE DAILY 180 tablet 3  . nitroGLYCERIN (NITROSTAT) 0.4 MG SL tablet Place 0.4 mg under the tongue every 5 (five) minutes as needed for chest pain (x 3 doses).    . pantoprazole (PROTONIX) 40 MG tablet TAKE 1 TABLET BY MOUTH DAILY AT NOON 90 tablet 2  . potassium chloride (KLOR-CON) 20 MEQ packet Take by mouth 2 (two) times daily.    [name] PRECISION XTRA TEST STRIPS test strip TEST BLOOD SUGAR 1-2 TIMES DAILY 200 each 3  . Tamsulosin HCl (FLOMAX) 0.4 MG CAPS Take 0.4 mg by mouth every other day.      No current facility-administered medications for this visit.     Allergies:   Erythromycin base, Amoxicillin, Cefadroxil, Ciprofloxacin, and  Erythromycin    Social History:  The patient  reports that he quit smoking about 60 years ago. His smoking use included cigarettes. He has a 30.00 pack-year smoking history. He has never used smokeless tobacco. He reports that he does not drink alcohol or use drugs.   Family History:  The patient's family history includes Brain cancer in his father; Throat cancer in his brother.    ROS: All other systems are reviewed and negative. Unless otherwise mentioned in H&P    PHYSICAL  EXAM: VS:  BP 120/70   Pulse 82   Ht 5\' 6"  (1.676 m)   Wt 200 lb 3.2 oz (90.8 kg)   SpO2 96%   BMI 32.31 kg/m  , BMI Body mass index is 32.31 kg/m. GEN: Well nourished, well developed, in no acute distress HEENT: normal Neck: no JVD, carotid bruits, or masses Cardiac: IRRR; tachycardic, harsh 2/6 systolic murmur RUSB to apex, no, rubs, or gallops,no edema  Respiratory:  Clear to auscultation bilaterally, normal work of breathing GI: soft, nontender, nondistended, + BS MS: no deformity or atrophy Skin: warm and dry, no rash Neuro:  Strength and sensation are intact Psych: euthymic mood, full affect   EKG: Not completed this office visit.   Recent Labs: 10/04/2018: ALT 8; BNP 261.1; Hemoglobin 12.0; Platelets 277.0; TSH 1.57 10/13/2018: BUN 18; Creatinine, Ser 1.21; Potassium 5.2; Sodium 141    Lipid Panel    Component Value Date/Time   CHOL 143 10/04/2018 1450   TRIG 106.0 10/04/2018 1450   HDL 44.60 10/04/2018 1450   CHOLHDL 3 10/04/2018 1450   VLDL 21.2 10/04/2018 1450   LDLCALC 77 10/04/2018 1450      Wt Readings from Last 3 Encounters:  01/15/19 200 lb 3.2 oz (90.8 kg)  01/09/19 190 lb (86.2 kg)  12/21/18 194 lb (88 kg)      Other studies Reviewed: Echocardiogram 10/11/2018 1. The left ventricle has normal systolic function, with an ejection fraction of 55-60%. The cavity size was normal. Left ventricular diastolic function could not be evaluated secondary to atrial fibrillation.  Elevated left ventricular end-diastolic  pressure No evidence of left ventricular regional wall motion abnormalities.  2. The right ventricle has normal systolic function. The cavity was mildly enlarged. There is no increase in right ventricular wall thickness.  3. Left atrial size was severely dilated.  4. The aortic valve is tricuspid. Severely thickening of the aortic valve. Severe calcifcation of the aortic valve. Aortic valve regurgitation is trivial by color flow Doppler. Moderate stenosis of the aortic valve. Moderate aortic annular calcification  noted. AV Vmax: 232.40 cm and AV Mean Grad: 13.0 mmHg are consistent with mild AS but visually the AV appears to be at least moderately stenosis.  5. There is mild to moderate mitral annular calcification present. No evidence of mitral valve stenosis  6. There is mild dilatation of the ascending aorta measuring 43 mm.  ASSESSMENT AND PLAN:  1. Chronic atrial fibrillation. Rate is well controlled on Toprol XL to 100 mg bid and Cardizem CD 360 mg daily. On Eliquis for anticoagulation. Asymptomatic.   2. CAD s/p remote CABG 25 years ago. S/p DES of RCA 01/08/15. He has stable class 1 angina.  3. Left parietal CVA with ataxia. On Eliquis.   4. Chronic diastolic CHF. Good response to lasix. No edema and weight is stable. Restrict sodium intake.  5. Moderate aortic stenosis. Recommend repeat Echo in one year.   6. HTN - controlled.   7. DM type 2- on insulin, metformin,and Jardiance.   8. Hyperlipidemia. On lipitor 40 mg. LDL 77.   9. Carotid arterial disease. No change by dopplers in October 2018. Scheduled for repeat dopplers at [facility] in 2 weeks.  10. Pericardial calcification noted incidentally on CT.  Follow up in 6 months.

## 2019-01-15 ENCOUNTER — Other Ambulatory Visit: Payer: Self-pay

## 2019-01-15 ENCOUNTER — Telehealth: Payer: Self-pay | Admitting: Family Medicine

## 2019-01-15 ENCOUNTER — Ambulatory Visit (INDEPENDENT_AMBULATORY_CARE_PROVIDER_SITE_OTHER): Payer: Medicare Other | Admitting: Cardiology

## 2019-01-15 ENCOUNTER — Telehealth: Payer: Self-pay | Admitting: Physical Therapy

## 2019-01-15 ENCOUNTER — Encounter: Payer: Self-pay | Admitting: Cardiology

## 2019-01-15 VITALS — BP 120/70 | HR 82 | Ht 66.0 in | Wt 200.2 lb

## 2019-01-15 DIAGNOSIS — I2510 Atherosclerotic heart disease of native coronary artery without angina pectoris: Secondary | ICD-10-CM

## 2019-01-15 DIAGNOSIS — I4819 Other persistent atrial fibrillation: Secondary | ICD-10-CM

## 2019-01-15 DIAGNOSIS — I350 Nonrheumatic aortic (valve) stenosis: Secondary | ICD-10-CM

## 2019-01-15 DIAGNOSIS — I6523 Occlusion and stenosis of bilateral carotid arteries: Secondary | ICD-10-CM

## 2019-01-15 DIAGNOSIS — I5032 Chronic diastolic (congestive) heart failure: Secondary | ICD-10-CM | POA: Diagnosis not present

## 2019-01-15 DIAGNOSIS — E7800 Pure hypercholesterolemia, unspecified: Secondary | ICD-10-CM | POA: Diagnosis not present

## 2019-01-15 MED ORDER — OXYCODONE-ACETAMINOPHEN 10-325 MG PO TABS: 1.0000 | ORAL_TABLET | Freq: Two times a day (BID) | ORAL | 0 refills | Status: DC | PRN

## 2019-01-15 NOTE — Addendum Note (Signed)
Addended by: Billey Chang on: 01/15/2019 03:45 PM   Modules accepted: Orders

## 2019-01-15 NOTE — Telephone Encounter (Signed)
Patient's wife says patient was taking oxycodone at night to help him sleep with his hip pain.  He stopped taking it because he didn't want to become addicted.  Patient saw Dr. Martinique today and Dr. Martinique told him he wouldn't become addicted if he was only taking one tablet at night to help him sleep.  Dr. Martinique suggest he contact PCP for a new Rx so that he could get pain relief to help him sleep.  Wife asking if new Rx can be written.  Please advise.

## 2019-01-15 NOTE — Telephone Encounter (Signed)
Patient's wife came into the office this morning. She said that Isabel had just seen the cardiologist who is changing his medication and they need Dr. Jonni Sanger to write a new prescription I believe. Please call pt or wife Lelon Frohlich at [phone] to answer their questions about this change. Thank you!

## 2019-01-15 NOTE — Telephone Encounter (Signed)
Spoke with patient.  He wanted Dr. Jonni Sanger to know he will be starting physical therapy for his hip.

## 2019-01-15 NOTE — Telephone Encounter (Signed)
Copied from [place] [phone]. Topic: General - Other >> Jan 15, 2019  3:14 PM Yvette Rack wrote: Reason for CRM: Pt stated he needs to speak with Dr. Jonni Sanger regarding treatment for his hip. Pt requests call back.

## 2019-01-19 ENCOUNTER — Ambulatory Visit (INDEPENDENT_AMBULATORY_CARE_PROVIDER_SITE_OTHER): Payer: Medicare Other | Admitting: Podiatry

## 2019-01-19 ENCOUNTER — Other Ambulatory Visit: Payer: Self-pay

## 2019-01-19 ENCOUNTER — Encounter: Payer: Self-pay | Admitting: Podiatry

## 2019-01-19 DIAGNOSIS — B351 Tinea unguium: Secondary | ICD-10-CM | POA: Diagnosis not present

## 2019-01-19 DIAGNOSIS — M79674 Pain in right toe(s): Secondary | ICD-10-CM | POA: Diagnosis not present

## 2019-01-19 DIAGNOSIS — D689 Coagulation defect, unspecified: Secondary | ICD-10-CM | POA: Diagnosis not present

## 2019-01-19 DIAGNOSIS — E119 Type 2 diabetes mellitus without complications: Secondary | ICD-10-CM

## 2019-01-19 NOTE — Progress Notes (Signed)
Patient ID: [name], male   DOB: 01/01/1929, 85 y.o.   MRN: [id number] Complaint:  Visit Type: Patient returns to my office for continued preventative foot care services. Complaint: Patient states" my nails have grown long and thick and become painful to walk and wear shoes" Patient has been diagnosed with DM with no foot  complications. He presents for preventative foot care services. No changes to ROS.  Patient is on eliquiss.  Podiatric Exam: Vascular: dorsalis pedis and posterior tibial pulses are palpable bilateral. Capillary return is immediate. Temperature gradient is WNL. Skin turgor WNL  Sensorium: Normal Semmes Weinstein monofilament test. Normal tactile sensation bilaterally. Nail Exam: Pt has thick disfigured discolored nails with subungual debris noted bilateral entire nail hallux through fifth toenails Ulcer Exam: There is no evidence of ulcer or pre-ulcerative changes or infection. Orthopedic Exam: Muscle tone and strength are WNL. No limitations in general ROM. No crepitus or effusions noted. Foot type and digits show no abnormalities. Bony prominences are unremarkable. Skin: No Porokeratosis. No infection or ulcers  Diagnosis:  Tinea unguium, Pain in right toe, pain in left toes  Treatment & Plan Procedures and Treatment: Consent by patient was obtained for treatment procedures. The patient understood the discussion of treatment and procedures well. All questions were answered thoroughly reviewed. Debridement of mycotic and hypertrophic toenails, 1 through 5 bilateral and clearing of subungual debris. No ulceration, no infection noted.  Return Visit-Office Procedure: Patient instructed to return to the office for a follow up visit 10 weeks  for continued evaluation and treatment.    Jaqualyn Juday DPM 

## 2019-01-22 ENCOUNTER — Other Ambulatory Visit: Payer: Self-pay

## 2019-01-22 ENCOUNTER — Encounter: Payer: Self-pay | Admitting: Physical Therapy

## 2019-01-22 ENCOUNTER — Ambulatory Visit (INDEPENDENT_AMBULATORY_CARE_PROVIDER_SITE_OTHER): Payer: Medicare Other | Admitting: Physical Therapy

## 2019-01-22 DIAGNOSIS — M25551 Pain in right hip: Secondary | ICD-10-CM | POA: Diagnosis not present

## 2019-01-22 DIAGNOSIS — M6281 Muscle weakness (generalized): Secondary | ICD-10-CM

## 2019-01-22 DIAGNOSIS — M25651 Stiffness of right hip, not elsewhere classified: Secondary | ICD-10-CM

## 2019-01-22 DIAGNOSIS — R2689 Other abnormalities of gait and mobility: Secondary | ICD-10-CM

## 2019-01-22 NOTE — Patient Instructions (Addendum)
Access Code: [email or web address]  URL: [email or web address]  Date: 01/22/2019  Prepared by: Elsie Ra   Exercises  Supine Piriformis Stretch with Foot on Ground - 3 sets - 30 hold - 2x daily - 6x weekly  Supine ITB Stretch with Strap - 2-3 reps - 30 hold - 2x daily - 6x weekly  Supine Bridge - 10 reps - 1-2 sets - 5 hold - 2x daily - 6x weekly  Clamshell - 10 reps - 2 sets - 2x daily - 6x weekly  Standing Marching - 10 reps - 1-3 sets - 2x daily - 6x weekly  Standing Hip Abduction with Counter Support - 10 reps - 3 sets - 2x daily - 6x weekly  TENS UNIT: This is helpful for muscle pain and spasm.   Search and Purchase a TENS 7000 2nd edition at www.tenspros.com. It should be less than $30.     TENS unit instructions: Do not shower or bathe with the unit on Turn the unit off before removing electrodes or batteries If the electrodes lose stickiness add a drop of water to the electrodes after they are disconnected from the unit and place on plastic sheet. If you continued to have difficulty, call the TENS unit company to purchase more electrodes. Do not apply lotion on the skin area prior to use. Make sure the skin is clean and dry as this will help prolong the life of the electrodes. After use, always check skin for unusual red areas, rash or other skin difficulties. If there are any skin problems, does not apply electrodes to the same area. Never remove the electrodes from the unit by pulling the wires. Do not use the TENS unit or electrodes other than as directed. Do not change electrode placement without consultating your therapist or physician. Keep 2 fingers with between each electrode. Wear time ratio is 2:1, on to off times.    For example on for 30 minutes off for 15 minutes and then on for 30 minutes off for 15 minutes

## 2019-01-22 NOTE — Therapy (Signed)
[place], [place], [address] Phone: [phone]   Fax:  [phone]  Physical Therapy Evaluation  Patient Details  Name: Inti Kroll MRN: [id number] Date of Birth: [birth year] Referring Provider (PT): Leandrew Koyanagi, MD   Encounter Date: 01/22/2019  PT End of Session - 01/22/19 1407    Visit Number  1    Number of Visits  16    Date for PT Re-Evaluation  03/19/19    Authorization Type  MCR/BCBS    PT Start Time  1310    PT Stop Time  1400    PT Time Calculation (min)  50 min    Activity Tolerance  Patient tolerated treatment well    Behavior During Therapy  [facility] for tasks assessed/performed       Past Medical History:  Diagnosis Date  . Abdominal pain   . Acute renal failure ([place])     resolved  . Arthritis    "hands & legs" (11/'10/2014)  . Ataxia   . Atrial fibrillation ([place])   . Bladder outlet obstruction   . Bladder outlet obstruction   . BPH (benign prostatic hyperplasia)   . CAD (coronary artery disease)    a. CABG IN 1989. b. 01/08/2015 CTO of ost LAD, LIMA to LAD not visualized but assumed patent given myoview finding, occluded SVG to diagonal, 99% mid RCA tx w/ SYNERGY DES 3X28 mm  . Constipation   . Diabetes mellitus type 2, insulin dependent ([place])   . Epistaxis, recurrent Feb 2017  . GERD (gastroesophageal reflux disease)   . HTN (hypertension)   . Hx of bacterial pneumonia   . Hyperlipemia   . Kidney stones   . Mild aortic stenosis   . Rib fractures    left rib fractures being treated with pain medications  . Stented coronary artery Nov 2016   RCA DES  . Urinary tract infection     Enterococcus    Past Surgical History:  Procedure Laterality Date  . CARDIAC CATHETERIZATION  1989  . CARDIAC CATHETERIZATION N/A 01/08/2015   Procedure: Left Heart Cath and Cors/Grafts Angiography;  Surgeon: Peter M Martinique, MD;  Location: [place] [facility];  Service: Cardiovascular;  Laterality: N/A;  .  CARDIAC CATHETERIZATION  01/08/2015   Procedure: Coronary Stent Intervention;  Surgeon: Peter M Martinique, MD;  Location: [place] [facility];  Service: Cardiovascular;;  . CARPAL TUNNEL RELEASE Right 08/2010  . CATARACT EXTRACTION W/ INTRAOCULAR LENS  IMPLANT, BILATERAL Bilateral   . CORONARY ANGIOPLASTY  01/08/15   RCA DES  . CORONARY ARTERY BYPASS GRAFT  1989   "CABG X 2"  . JOINT REPLACEMENT    . TONSILLECTOMY    . TOTAL HIP ARTHROPLASTY Right 2000    There were no vitals filed for this visit.   Subjective Assessment - 01/22/19 1312    Subjective  chronic Rt hip pain since 2000 and had Rt THA 20 years ago.He has in the last year received multiple injections in his back and right hip. He has been taking pain meds but this is not helping very much. He has also seen a neurosurgeon for evaluation of his back problems ruled out for this. He then saw Dr. Erlinda Hong who thinks maybe abductor tendonopathy and referred him to PT.    Pertinent History  PMH: lumbar DDD L3-4, CAD,DM,Afib    Limitations  Sitting;Walking;House hold activities    How long can you walk comfortably?  can not walk around the grocery store,  has to use scooter due to pain    Diagnostic tests  hip XR "Crescentic approximately 2.6 cm ossicle superior to the rightgreater trochanter may represent the sequela of age-indeterminateavulsive injury/fracture. Correlation for point tenderness at thislocation is advised."    Patient Stated Goals  reduce hip pain    Currently in Pain?  Yes    Pain Score  7     Pain Location  Hip    Pain Orientation  Right    Pain Type  Chronic pain    Pain Radiating Towards  Rt latreral hip, sometime into his groin    Pain Onset  More than a month ago    Pain Frequency  Intermittent    Aggravating Factors   sometimes hurts just with rest    Pain Relieving Factors  sometimes meds    Multiple Pain Sites  No         [facility] PT Assessment - 01/22/19 0001      Assessment   Medical Diagnosis  Chronic Rt Hip  pain (S/P Rt THA 20 years ago)    Referring Provider (PT)  Leandrew Koyanagi, MD    Onset Date/Surgical Date  --   20 years of Rt hip pain, THA 20 years ago   Next MD Visit  nothing scheduled      Precautions   Precautions  None      Balance Screen   Has the patient fallen in the past 6 months  No      [place] residence    Additional Comments  one step to get in      Prior Function   Level of [place]  Retired    Leisure  visit grandkids      Cognition   Overall Cognitive Status  Within Functional Limits for tasks assessed      Sensation   Light Touch  Appears Intact      ROM / Strength   AROM / PROM / Strength  AROM;Strength      AROM   AROM Assessment Site  Hip    Right/Left Hip  Right    Right Hip Flexion  90    Right Hip External Rotation   15    Right Hip Internal Rotation   10      Strength   Strength Assessment Site  Knee;Hip    Right/Left Hip  Right    Right Hip Flexion  4-/5    Right Hip Extension  4-/5    Right Hip External Rotation   4-/5    Right Hip Internal Rotation  4-/5    Right Hip ABduction  3+/5    Right/Left Knee  Right    Right Knee Flexion  5/5    Right Knee Extension  5/5      Flexibility   Soft Tissue Assessment /Muscle Length  --   mild H.S. tightness, severe hip rotation tightness     Palpation   Palpation comment  TTP Rt greater trochanter and lateral hip      Special Tests   Other special tests  neg SLR test      Transfers   Transfers  Independent with all Transfers    Comments  increased time needed due to pain and difficulty      Ambulation/Gait   Ambulation/Gait  Yes    Ambulation/Gait Assistance  6: Modified independent (Device/Increase time)    Ambulation Distance (  Feet)  50 Feet    Assistive device  Straight cane    Gait Pattern  Step-to pattern;Decreased step length - right;Decreased stance time - right;Decreased hip/knee flexion - right    Gait  velocity  slower speed                Objective measurements completed on examination: See above findings.      [facility] Adult PT Treatment/Exercise - 01/22/19 0001      Modalities   Modalities  Cryotherapy;Electrical Stimulation      Cryotherapy   Number Minutes Cryotherapy  10 Minutes   with TENS   Cryotherapy Location  Hip    Type of Cryotherapy  Ice pack      Electrical Stimulation   Electrical Stimulation Location  Rt lateral hip    Electrical Stimulation Action  IFC in Lt sidelying    Electrical Stimulation Parameters  tolerance    Electrical Stimulation Goals  Pain             PT Education - 01/22/19 1407    Education Details  HEP, POC, TENS    Person(s) Educated  Patient    Methods  Explanation;Demonstration;Verbal cues;Handout    Comprehension  Verbalized understanding;Need further instruction       PT Short Term Goals - 01/22/19 1419      PT SHORT TERM GOAL #1   Title  Pt will be I and compliant with initial HEP. (Target for STG's 4 weeks 02/21/19)    Status  New      PT SHORT TERM GOAL #2   Title  Pt will report overall 25% reduction in pain    Baseline  8/10 avg        PT Long Term Goals - 01/22/19 1420      PT LONG TERM GOAL #1   Title  Pt will independently perform HEP and verbalize plan for continued exercise post PT DC.   (Target date all LTG's 8 weeks 03/19/19)    Status  New      PT LONG TERM GOAL #2   Title  Pt will improve Rt hip strenght to overall 4+/5 MMT to improve funciton.    Status  New      PT LONG TERM GOAL #3   Title  Pt will improve Rt hip ROM to [facility] greater than 50% ROM.    Status  New      PT LONG TERM GOAL #4   Title  Pt will report overall 50% pain reduction with usual activity.    Baseline  8/10    Status  New      PT LONG TERM GOAL #5   Title  Pt will be able to ambulate at least 400 ft with LRAD mod I to improve to limited community ambulation    Baseline  50 ft due to pain    Status  New              Plan - 01/22/19 1410    Clinical Impression Statement  Pt presents with chronic Rt hip pain over the last 20 years and has had Rt THA 20 years ago. He has signs of glute tendonopathy, and chronic avulsion fx at greater trochanter seen on XR. He has overall decreased hip ROM and mobility, decreased hip strength, decreased activity tolerance for standing or walking activity and increased pain limiting his full funciton. He will benefit from skilled PT to address his deficits. He was trialed with TENS and  ice today and this did reduce some pain post session.    Personal Factors and Comorbidities  Age;Comorbidity 3+;Past/Current Experience;Time since onset of injury/illness/exacerbation    Comorbidities  PMH: lumbar stenosis and DDD L3-4, CAD,DM,Afib    Examination-Activity Limitations  Bed Mobility;Bend;Carry;Lift;Dressing;Stairs;Squat;Stand;Locomotion Level;Transfers    Examination-Participation Restrictions  Meal Prep;Cleaning;Community Activity;Driving;Laundry;Shop    Stability/Clinical Decision Making  Evolving/Moderate complexity    Clinical Decision Making  Moderate    Rehab Potential  Good    PT Frequency  2x / week    PT Duration  8 weeks    PT Treatment/Interventions  ADLs/Self Care Home Management;Aquatic Therapy;Cryotherapy;Electrical Stimulation;Iontophoresis 4mg /ml Dexamethasone;Moist Heat;Traction;Ultrasound;DME Instruction;Gait training;Stair training;Functional mobility training;Therapeutic activities;Therapeutic exercise;Balance training;Neuromuscular re-education;Manual techniques;Passive range of motion;Dry needling;Joint Manipulations;Taping    PT Next Visit Plan  review HEP and updated PRN, consider MT or modalaties for pain, needs Rt hip stretching and strengthening, gentle activity progression    PT Home Exercise Plan  Access Code: [email or web address] and Agree with Plan of Care  Patient       Patient will benefit from skilled therapeutic intervention in  order to improve the following deficits and impairments:  Abnormal gait, Decreased activity tolerance, Decreased balance, Decreased mobility, Decreased endurance, Decreased range of motion, Decreased strength, Hypomobility, Difficulty walking, Increased muscle spasms, Increased fascial restricitons, Impaired flexibility, Postural dysfunction, Pain, Obesity  Visit Diagnosis: Pain in right hip  Stiffness of right hip, not elsewhere classified  Muscle weakness (generalized)  Other abnormalities of gait and mobility     Problem List Patient Active Problem List   Diagnosis Date Noted  . Hip pain 01/09/2019  . Spinal stenosis of lumbar region 02/21/2018  . Degeneration of lumbar intervertebral disc 02/21/2018  . History of revision of total replacement of right hip joint 01/03/2018  . Diabetic peripheral neuropathy associated with type 2 diabetes mellitus ([employer]) 05/05/2017  . Atherosclerotic heart disease of native coronary artery without angina pectoris 06/13/2016  . CVA (cerebral vascular accident) ([place]) 06/06/2016  . Leukocytosis 06/02/2016  . Carotid stenosis 06/02/2016  . Recurrent coronary arteriosclerosis after percutaneous transluminal coronary angioplasty 04/18/2015  . Epistaxis, recurrent 04/11/2015  . CAD S/P PCI- Nov 2016   . Chronic vertigo 03/15/2014  . Current use of long term anticoagulation 02/06/2013  . Obesity (BMI 30.0-34.9) 01/31/2012  . Osteoarthritis, knee 01/31/2012  . Allergic rhinitis 10/28/2011  . Chronic atrial fibrillation ([place]) 07/30/2011  . Hearing loss 06/08/2011  . Primary osteoarthritis of hand 06/08/2011  . Enlarged prostate without lower urinary tract symptoms (luts) 01/04/2011  . Gastro-esophageal reflux disease without esophagitis 12/07/2010  . Hx of CABG x 2 1990   . Essential (primary) hypertension   . Diabetes mellitus type 2, insulin dependent ([place])   . Hyperlipemia   . Hematuria 08/27/2008    Silvestre Mesi 01/22/2019, 2:29  PM  [facility] Physical Therapy [address] [place], [place], [address] Phone: [phone]   Fax:  [phone]  Name: Sears Haag MRN: [id number] Date of Birth: [birth year]

## 2019-01-29 DIAGNOSIS — H401111 Primary open-angle glaucoma, right eye, mild stage: Secondary | ICD-10-CM | POA: Diagnosis not present

## 2019-01-29 DIAGNOSIS — H04123 Dry eye syndrome of bilateral lacrimal glands: Secondary | ICD-10-CM | POA: Diagnosis not present

## 2019-01-31 ENCOUNTER — Other Ambulatory Visit: Payer: Self-pay

## 2019-01-31 ENCOUNTER — Ambulatory Visit (INDEPENDENT_AMBULATORY_CARE_PROVIDER_SITE_OTHER): Payer: Medicare Other | Admitting: Physical Therapy

## 2019-01-31 DIAGNOSIS — M6281 Muscle weakness (generalized): Secondary | ICD-10-CM

## 2019-01-31 DIAGNOSIS — M25551 Pain in right hip: Secondary | ICD-10-CM | POA: Diagnosis not present

## 2019-01-31 DIAGNOSIS — R2689 Other abnormalities of gait and mobility: Secondary | ICD-10-CM | POA: Diagnosis not present

## 2019-01-31 DIAGNOSIS — M25651 Stiffness of right hip, not elsewhere classified: Secondary | ICD-10-CM

## 2019-01-31 NOTE — Patient Instructions (Signed)
Access Code: [email or web address]  URL: [email or web address]  Date: 01/31/2019  Prepared by: Elsie Ra   Exercises  Supine Piriformis Stretch with Foot on Ground - 3 sets - 30 hold - 2x daily - 6x weekly  Seated Hamstring Stretch - 3 sets - 30 hold - 2x daily - 6x weekly  Supine ITB Stretch with Strap - 2-3 reps - 30 hold - 2x daily - 6x weekly  Supine Bridge - 10 reps - 1-2 sets - 5 hold - 2x daily - 6x weekly  Clamshell - 10 reps - 2 sets - 2x daily - 6x weekly  Supine Hip Adduction Isometric with Ball - 10 reps - 3 sets - 2x daily - 6x weekly  Supine March - 10 reps - 1-2 sets - 2x daily - 6x weekly

## 2019-01-31 NOTE — Therapy (Signed)
[place], [place], [address] Phone: [phone]   Fax:  [phone]  Physical Therapy Treatment  Patient Details  Name: [name] MRN: [id number] Date of Birth: 1928-06-08 Referring Provider (PT): Leandrew Koyanagi, MD   Encounter Date: 01/31/2019  PT End of Session - 01/31/19 1255    Visit Number  2    Number of Visits  16    Date for PT Re-Evaluation  03/19/19    Authorization Type  MCR/BCBS    PT Start Time  1145    PT Stop Time  1230    PT Time Calculation (min)  45 min    Activity Tolerance  Patient tolerated treatment well    Behavior During Therapy  [facility] for tasks assessed/performed       Past Medical History:  Diagnosis Date  . Abdominal pain   . Acute renal failure ([place])     resolved  . Arthritis    "hands & legs" (11/'10/2014)  . Ataxia   . Atrial fibrillation ([place])   . Bladder outlet obstruction   . Bladder outlet obstruction   . BPH (benign prostatic hyperplasia)   . CAD (coronary artery disease)    a. CABG IN 1989. b. 01/08/2015 CTO of ost LAD, LIMA to LAD not visualized but assumed patent given myoview finding, occluded SVG to diagonal, 99% mid RCA tx w/ SYNERGY DES 3X28 mm  . Constipation   . Diabetes mellitus type 2, insulin dependent ([place])   . Epistaxis, recurrent Feb 2017  . GERD (gastroesophageal reflux disease)   . HTN (hypertension)   . Hx of bacterial pneumonia   . Hyperlipemia   . Kidney stones   . Mild aortic stenosis   . Rib fractures    left rib fractures being treated with pain medications  . Stented coronary artery Nov 2016   RCA DES  . Urinary tract infection     Enterococcus    Past Surgical History:  Procedure Laterality Date  . CARDIAC CATHETERIZATION  1989  . CARDIAC CATHETERIZATION N/A 01/08/2015   Procedure: Left Heart Cath and Cors/Grafts Angiography;  Surgeon: Peter M Martinique, MD;  Location: [place] [facility];  Service: Cardiovascular;  Laterality: N/A;  .  CARDIAC CATHETERIZATION  01/08/2015   Procedure: Coronary Stent Intervention;  Surgeon: Peter M Martinique, MD;  Location: [place] [facility];  Service: Cardiovascular;;  . CARPAL TUNNEL RELEASE Right 08/2010  . CATARACT EXTRACTION W/ INTRAOCULAR LENS  IMPLANT, BILATERAL Bilateral   . CORONARY ANGIOPLASTY  01/08/15   RCA DES  . CORONARY ARTERY BYPASS GRAFT  1989   "CABG X 2"  . JOINT REPLACEMENT    . TONSILLECTOMY    . TOTAL HIP ARTHROPLASTY Right 2000    There were no vitals filed for this visit.  Subjective Assessment - 01/31/19 1248    Subjective  Relays compliance with HEP, the first 4 exercises are good, the last 2 aggravated his hip, pain 7/10 today in Rt lateral hip and groin    Pertinent History  PMH: lumbar DDD L3-4, CAD,DM,Afib    Limitations  Sitting;Walking;House hold activities    How long can you walk comfortably?  can not walk around the grocery store, has to use scooter due to pain    Diagnostic tests  hip XR "Crescentic approximately 2.6 cm ossicle superior to the rightgreater trochanter may represent the sequela of age-indeterminateavulsive injury/fracture. Correlation for point tenderness at thislocation is advised."    Patient Stated Goals  reduce hip pain    Pain Onset  More than a month ago                       [facility] Adult PT Treatment/Exercise - 01/31/19 0001      Ambulation/Gait   Gait Comments  antalgic step  to pattern using SPC pre tx, less antalgic gait and more step through pattern post tx      Exercises   Exercises  Knee/Hip      Knee/Hip Exercises: Stretches   Passive Hamstring Stretch  Right;3 reps;30 seconds    ITB Stretch Limitations  passive to Rt 3 reps 30 sec      Knee/Hip Exercises: Aerobic   Nustep  L3 for 5 min      Knee/Hip Exercises: Supine   Hip Adduction Isometric Limitations  ball sq,  5sec hold X 20 reps    Bridges Limitations  bridge with ball sq 2X10    Other Supine Knee/Hip Exercises  supine marches  X30 reps bilat  (instructed to do 15 reps but he did 30)    Other Supine Knee/Hip Exercises  supine clam with L2 band 2X10      Modalities   Modalities  Cryotherapy;Electrical Stimulation      Cryotherapy   Number Minutes Cryotherapy  10 Minutes    Cryotherapy Location  Hip    Type of Cryotherapy  Ice pack      Electrical Stimulation   Electrical Stimulation Location  Rt lateral hip    Electrical Stimulation Action  IFC in Lt sidelying    Electrical Stimulation Parameters  tolerance    Electrical Stimulation Goals  Pain      Manual Therapy   Manual therapy comments  PROM to Right hip all planes, manual stretching for SKTC, H.S, IT band             PT Education - 01/31/19 1253    Education Details  HEP revision. self care for set up and educaiton for his home TENS unit    Person(s) Educated  Patient    Methods  Explanation;Demonstration;Verbal cues;Handout    Comprehension  Verbalized understanding       PT Short Term Goals - 01/22/19 1419      PT SHORT TERM GOAL #1   Title  Pt will be I and compliant with initial HEP. (Target for STG's 4 weeks 02/21/19)    Status  New      PT SHORT TERM GOAL #2   Title  Pt will report overall 25% reduction in pain    Baseline  8/10 avg        PT Long Term Goals - 01/22/19 1420      PT LONG TERM GOAL #1   Title  Pt will independently perform HEP and verbalize plan for continued exercise post PT DC.   (Target date all LTG's 8 weeks 03/19/19)    Status  New      PT LONG TERM GOAL #2   Title  Pt will improve Rt hip strenght to overall 4+/5 MMT to improve funciton.    Status  New      PT LONG TERM GOAL #3   Title  Pt will improve Rt hip ROM to [facility] greater than 50% ROM.    Status  New      PT LONG TERM GOAL #4   Title  Pt will report overall 50% pain reduction with usual activity.    Baseline  8/10    Status  New      PT LONG TERM GOAL #5   Title  Pt will be able to ambulate at least 400 ft with LRAD mod I to improve to limited community  ambulation    Baseline  50 ft due to pain    Status  New            Plan - 01/31/19 1256    Clinical Impression Statement  He had flare up in hip pain performing standing open chain exercises in HEP. This was modified today to supine and he was able to perform without increased pain. This modification was made to his HEP and he recieved new print out. He brought in his own TENS unit and PT showed him how to set this up and use it. He was treated with manual therapy for PROM and manual stretching due to ROM restrictions and tighntess. He was able to walk out of session with improved gait and less pain. Continue POC.    Personal Factors and Comorbidities  Age;Comorbidity 3+;Past/Current Experience;Time since onset of injury/illness/exacerbation    Comorbidities  PMH: lumbar stenosis and DDD L3-4, CAD,DM,Afib    Examination-Activity Limitations  Bed Mobility;Bend;Carry;Lift;Dressing;Stairs;Squat;Stand;Locomotion Level;Transfers    Examination-Participation Restrictions  Meal Prep;Cleaning;Community Activity;Driving;Laundry;Shop    Stability/Clinical Decision Making  Evolving/Moderate complexity    Rehab Potential  Good    PT Frequency  2x / week    PT Duration  8 weeks    PT Treatment/Interventions  ADLs/Self Care Home Management;Aquatic Therapy;Cryotherapy;Electrical Stimulation;Iontophoresis 4mg /ml Dexamethasone;Moist Heat;Traction;Ultrasound;DME Instruction;Gait training;Stair training;Functional mobility training;Therapeutic activities;Therapeutic exercise;Balance training;Neuromuscular re-education;Manual techniques;Passive range of motion;Dry needling;Joint Manipulations;Taping    PT Next Visit Plan  review HEP and updated PRN, consider MT or modalaties for pain, needs Rt hip stretching and strengthening, gentle activity progression    PT Home Exercise Plan  Access Code: [email or web address] and Agree with Plan of Care  Patient       Patient will benefit from skilled therapeutic  intervention in order to improve the following deficits and impairments:  Abnormal gait, Decreased activity tolerance, Decreased balance, Decreased mobility, Decreased endurance, Decreased range of motion, Decreased strength, Hypomobility, Difficulty walking, Increased muscle spasms, Increased fascial restricitons, Impaired flexibility, Postural dysfunction, Pain, Obesity  Visit Diagnosis: Pain in right hip  Stiffness of right hip, not elsewhere classified  Muscle weakness (generalized)  Other abnormalities of gait and mobility     Problem List Patient Active Problem List   Diagnosis Date Noted  . Hip pain 01/09/2019  . Spinal stenosis of lumbar region 02/21/2018  . Degeneration of lumbar intervertebral disc 02/21/2018  . History of revision of total replacement of right hip joint 01/03/2018  . Diabetic peripheral neuropathy associated with type 2 diabetes mellitus ([place]) 05/05/2017  . Atherosclerotic heart disease of native coronary artery without angina pectoris 06/13/2016  . CVA (cerebral vascular accident) ([place]) 06/06/2016  . Leukocytosis 06/02/2016  . Carotid stenosis 06/02/2016  . Recurrent coronary arteriosclerosis after percutaneous transluminal coronary angioplasty 04/18/2015  . Epistaxis, recurrent 04/11/2015  . CAD S/P PCI- Nov 2016   . Chronic vertigo 03/15/2014  . Current use of long term anticoagulation 02/06/2013  . Obesity (BMI 30.0-34.9) 01/31/2012  . Osteoarthritis, knee 01/31/2012  . Allergic rhinitis 10/28/2011  . Chronic atrial fibrillation ([place]) 07/30/2011  . Hearing loss 06/08/2011  . Primary osteoarthritis of hand 06/08/2011  . Enlarged prostate without lower urinary tract symptoms (luts) 01/04/2011  . Gastro-esophageal reflux disease without esophagitis 12/07/2010  .  Hx of CABG x 2 1990   . Essential (primary) hypertension   . Diabetes mellitus type 2, insulin dependent ([place])   . Hyperlipemia   . Hematuria 08/27/2008    Silvestre Mesi 01/31/2019, 12:59 PM  [facility] Physical Therapy [address] [place], [place], [address] Phone: [phone]   Fax:  [phone]  Name: [name] MRN: [id number] Date of Birth: 12/12/1928

## 2019-02-02 ENCOUNTER — Ambulatory Visit (INDEPENDENT_AMBULATORY_CARE_PROVIDER_SITE_OTHER): Payer: Medicare Other | Admitting: Physical Therapy

## 2019-02-02 ENCOUNTER — Encounter

## 2019-02-02 ENCOUNTER — Other Ambulatory Visit: Payer: Self-pay

## 2019-02-02 DIAGNOSIS — M25651 Stiffness of right hip, not elsewhere classified: Secondary | ICD-10-CM | POA: Diagnosis not present

## 2019-02-02 DIAGNOSIS — M25551 Pain in right hip: Secondary | ICD-10-CM | POA: Diagnosis not present

## 2019-02-02 DIAGNOSIS — R2689 Other abnormalities of gait and mobility: Secondary | ICD-10-CM | POA: Diagnosis not present

## 2019-02-02 DIAGNOSIS — M6281 Muscle weakness (generalized): Secondary | ICD-10-CM | POA: Diagnosis not present

## 2019-02-02 NOTE — Therapy (Signed)
[place], [place], [address] Phone: [phone]   Fax:  [phone]  Physical Therapy Treatment  Patient Details  Name: [name] MRN: [id number] Date of Birth: [birth year] Referring Provider (PT): Leandrew Koyanagi, MD   Encounter Date: 02/02/2019  PT End of Session - 02/02/19 1100    Visit Number  3    Number of Visits  16    Date for PT Re-Evaluation  03/19/19    Authorization Type  MCR/BCBS    PT Start Time  1015    PT Stop Time  1055    PT Time Calculation (min)  40 min    Activity Tolerance  Patient tolerated treatment well    Behavior During Therapy  [facility] for tasks assessed/performed       Past Medical History:  Diagnosis Date  . Abdominal pain   . Acute renal failure ([place])     resolved  . Arthritis    "hands & legs" (11/'10/2014)  . Ataxia   . Atrial fibrillation ([place])   . Bladder outlet obstruction   . Bladder outlet obstruction   . BPH (benign prostatic hyperplasia)   . CAD (coronary artery disease)    a. CABG IN 1989. b. 01/08/2015 CTO of ost LAD, LIMA to LAD not visualized but assumed patent given myoview finding, occluded SVG to diagonal, 99% mid RCA tx w/ SYNERGY DES 3X28 mm  . Constipation   . Diabetes mellitus type 2, insulin dependent ([place])   . Epistaxis, recurrent Feb 2017  . GERD (gastroesophageal reflux disease)   . HTN (hypertension)   . Hx of bacterial pneumonia   . Hyperlipemia   . Kidney stones   . Mild aortic stenosis   . Rib fractures    left rib fractures being treated with pain medications  . Stented coronary artery Nov 2016   RCA DES  . Urinary tract infection     Enterococcus    Past Surgical History:  Procedure Laterality Date  . CARDIAC CATHETERIZATION  1989  . CARDIAC CATHETERIZATION N/A 01/08/2015   Procedure: Left Heart Cath and Cors/Grafts Angiography;  Surgeon: Peter M Martinique, MD;  Location: [facility];  Service: Cardiovascular;  Laterality: N/A;  .  CARDIAC CATHETERIZATION  01/08/2015   Procedure: Coronary Stent Intervention;  Surgeon: Peter M Martinique, MD;  Location: [facility];  Service: Cardiovascular;;  . CARPAL TUNNEL RELEASE Right 08/2010  . CATARACT EXTRACTION W/ INTRAOCULAR LENS  IMPLANT, BILATERAL Bilateral   . CORONARY ANGIOPLASTY  01/08/15   RCA DES  . CORONARY ARTERY BYPASS GRAFT  1989   "CABG X 2"  . JOINT REPLACEMENT    . TONSILLECTOMY    . TOTAL HIP ARTHROPLASTY Right 2000    There were no vitals filed for this visit.  Subjective Assessment - 02/02/19 1022    Subjective  relays his hip has been feeling much better after last session and after modifying his HEP per PT recommendation.    Pertinent History  PMH: lumbar DDD L3-4, CAD,DM,Afib    Diagnostic tests  hip XR "Crescentic approximately 2.6 cm ossicle superior to the rightgreater trochanter may represent the sequela of age-indeterminateavulsive injury/fracture. Correlation for point tenderness at thislocation is advised."    Patient Stated Goals  reduce hip pain    Currently in Pain?  Yes    Pain Score  3     Pain Location  Hip    Pain Orientation  Right    Pain Descriptors /  Indicators  Aching    Pain Type  Chronic pain         [facility] PT Assessment - 02/02/19 0001      Assessment   Medical Diagnosis  Chronic Rt Hip pain (S/P Rt THA 20 years ago)    Referring Provider (PT)  Leandrew Koyanagi, MD      AROM   Right Hip Flexion  90    Right Hip External Rotation   15    Right Hip Internal Rotation   15                   [facility] Adult PT Treatment/Exercise - 02/02/19 0001      Ambulation/Gait   Gait Comments  still using SPC but improved gait pattern with step through pattern, longer strides, improved velocity and less antlagic      Exercises   Exercises  Knee/Hip      Knee/Hip Exercises: Stretches   Passive Hamstring Stretch  Right;3 reps;30 seconds    ITB Stretch Limitations  passive to Rt 3 reps 30 sec    Other Knee/Hip Stretches   passive hip add stretch 30 sec X 3 on Rt      Knee/Hip Exercises: Aerobic   Nustep  L5 for 6 min UE/LE seat 10      Knee/Hip Exercises: Seated   Other Seated Knee/Hip Exercises  hip IR and ER with L2 band X 10 ea      Knee/Hip Exercises: Supine   Hip Adduction Isometric Limitations  ball sq,  5sec hold X 20 reps    Bridges Limitations  bridge with ball sq 2X10    Other Supine Knee/Hip Exercises  supine marches  X20 reps bilat with L2 band around his knees    Other Supine Knee/Hip Exercises  supine clam with L2 band 2X10      Knee/Hip Exercises: Sidelying   Clams  for his Rt side X 15 reps      Modalities   Modalities  --   declined today     Cryotherapy   Cryotherapy Location  --      Acupuncturist Location  --    Electrical Stimulation Goals  --      Manual Therapy   Manual therapy comments  PROM to Right hip all planes, manual stretching for SKTC, H.S, IT band, adductors               PT Short Term Goals - 01/22/19 1419      PT SHORT TERM GOAL #1   Title  Pt will be I and compliant with initial HEP. (Target for STG's 4 weeks 02/21/19)    Status  New      PT SHORT TERM GOAL #2   Title  Pt will report overall 25% reduction in pain    Baseline  8/10 avg        PT Long Term Goals - 01/22/19 1420      PT LONG TERM GOAL #1   Title  Pt will independently perform HEP and verbalize plan for continued exercise post PT DC.   (Target date all LTG's 8 weeks 03/19/19)    Status  New      PT LONG TERM GOAL #2   Title  Pt will improve Rt hip strenght to overall 4+/5 MMT to improve funciton.    Status  New      PT LONG TERM GOAL #3   Title  Pt  will improve Rt hip ROM to [facility] greater than 50% ROM.    Status  New      PT LONG TERM GOAL #4   Title  Pt will report overall 50% pain reduction with usual activity.    Baseline  8/10    Status  New      PT LONG TERM GOAL #5   Title  Pt will be able to ambulate at least 400 ft with LRAD  mod I to improve to limited community ambulation    Baseline  50 ft due to pain    Status  New            Plan - 02/02/19 1100    Clinical Impression Statement  Less overall pain today and improved gait pattern. He was able to tolerate gentle strength progression today and is slowly improving Rt hip ROM. Continue POC    Personal Factors and Comorbidities  Age;Comorbidity 3+;Past/Current Experience;Time since onset of injury/illness/exacerbation    Comorbidities  PMH: lumbar stenosis and DDD L3-4, CAD,DM,Afib    Examination-Activity Limitations  Bed Mobility;Bend;Carry;Lift;Dressing;Stairs;Squat;Stand;Locomotion Level;Transfers    Examination-Participation Restrictions  Meal Prep;Cleaning;Community Activity;Driving;Laundry;Shop    Stability/Clinical Decision Making  Evolving/Moderate complexity    Rehab Potential  Good    PT Frequency  2x / week    PT Duration  8 weeks    PT Treatment/Interventions  ADLs/Self Care Home Management;Aquatic Therapy;Cryotherapy;Electrical Stimulation;Iontophoresis 4mg /ml Dexamethasone;Moist Heat;Traction;Ultrasound;DME Instruction;Gait training;Stair training;Functional mobility training;Therapeutic activities;Therapeutic exercise;Balance training;Neuromuscular re-education;Manual techniques;Passive range of motion;Dry needling;Joint Manipulations;Taping    PT Next Visit Plan  review HEP and updated PRN, consider MT or modalaties for pain, needs Rt hip stretching and strengthening, gentle activity progression    PT Home Exercise Plan  Access Code: [email or web address] and Agree with Plan of Care  Patient       Patient will benefit from skilled therapeutic intervention in order to improve the following deficits and impairments:  Abnormal gait, Decreased activity tolerance, Decreased balance, Decreased mobility, Decreased endurance, Decreased range of motion, Decreased strength, Hypomobility, Difficulty walking, Increased muscle spasms, Increased fascial  restricitons, Impaired flexibility, Postural dysfunction, Pain, Obesity  Visit Diagnosis: Pain in right hip  Stiffness of right hip, not elsewhere classified  Muscle weakness (generalized)  Other abnormalities of gait and mobility     Problem List Patient Active Problem List   Diagnosis Date Noted  . Hip pain 01/09/2019  . Spinal stenosis of lumbar region 02/21/2018  . Degeneration of lumbar intervertebral disc 02/21/2018  . History of revision of total replacement of right hip joint 01/03/2018  . Diabetic peripheral neuropathy associated with type 2 diabetes mellitus ([place]) 05/05/2017  . Atherosclerotic heart disease of native coronary artery without angina pectoris 06/13/2016  . CVA (cerebral vascular accident) ([place]) 06/06/2016  . Leukocytosis 06/02/2016  . Carotid stenosis 06/02/2016  . Recurrent coronary arteriosclerosis after percutaneous transluminal coronary angioplasty 04/18/2015  . Epistaxis, recurrent 04/11/2015  . CAD S/P PCI- Nov 2016   . Chronic vertigo 03/15/2014  . Current use of long term anticoagulation 02/06/2013  . Obesity (BMI 30.0-34.9) 01/31/2012  . Osteoarthritis, knee 01/31/2012  . Allergic rhinitis 10/28/2011  . Chronic atrial fibrillation ([place]) 07/30/2011  . Hearing loss 06/08/2011  . Primary osteoarthritis of hand 06/08/2011  . Enlarged prostate without lower urinary tract symptoms (luts) 01/04/2011  . Gastro-esophageal reflux disease without esophagitis 12/07/2010  . Hx of CABG x 2 1990   . Essential (primary) hypertension   . Diabetes mellitus type 2, insulin dependent (  [place])   . Hyperlipemia   . Hematuria 08/27/2008    Silvestre Mesi 02/02/2019, 11:01 AM  [facility] Physical Therapy [address] [place], [place], [address] Phone: [phone]   Fax:  [phone]  Name: [name] MRN: [id number] Date of Birth: [birth year]

## 2019-02-05 ENCOUNTER — Ambulatory Visit (INDEPENDENT_AMBULATORY_CARE_PROVIDER_SITE_OTHER): Payer: Medicare Other | Admitting: Physical Therapy

## 2019-02-05 ENCOUNTER — Other Ambulatory Visit: Payer: Self-pay

## 2019-02-05 DIAGNOSIS — M25651 Stiffness of right hip, not elsewhere classified: Secondary | ICD-10-CM

## 2019-02-05 DIAGNOSIS — M25551 Pain in right hip: Secondary | ICD-10-CM | POA: Diagnosis not present

## 2019-02-05 DIAGNOSIS — R2689 Other abnormalities of gait and mobility: Secondary | ICD-10-CM

## 2019-02-05 DIAGNOSIS — M6281 Muscle weakness (generalized): Secondary | ICD-10-CM | POA: Diagnosis not present

## 2019-02-05 NOTE — Therapy (Signed)
[place], [place], [address] Phone: [phone]   Fax:  [phone]  Physical Therapy Treatment  Patient Details  Name: [name] MRN: [id number] Date of Birth: Nov 12, 1928 Referring Provider (PT): Leandrew Koyanagi, MD   Encounter Date: 02/05/2019  PT End of Session - 02/05/19 1245    Visit Number  4    Number of Visits  16    Date for PT Re-Evaluation  03/19/19    Authorization Type  MCR/BCBS    PT Start Time  1145    PT Stop Time  1223    PT Time Calculation (min)  38 min    Activity Tolerance  Patient tolerated treatment well    Behavior During Therapy  [facility] for tasks assessed/performed       Past Medical History:  Diagnosis Date  . Abdominal pain   . Acute renal failure ([place])     resolved  . Arthritis    "hands & legs" (11/'10/2014)  . Ataxia   . Atrial fibrillation ([place])   . Bladder outlet obstruction   . Bladder outlet obstruction   . BPH (benign prostatic hyperplasia)   . CAD (coronary artery disease)    a. CABG IN 1989. b. 01/08/2015 CTO of ost LAD, LIMA to LAD not visualized but assumed patent given myoview finding, occluded SVG to diagonal, 99% mid RCA tx w/ SYNERGY DES 3X28 mm  . Constipation   . Diabetes mellitus type 2, insulin dependent ([place])   . Epistaxis, recurrent Feb 2017  . GERD (gastroesophageal reflux disease)   . HTN (hypertension)   . Hx of bacterial pneumonia   . Hyperlipemia   . Kidney stones   . Mild aortic stenosis   . Rib fractures    left rib fractures being treated with pain medications  . Stented coronary artery Nov 2016   RCA DES  . Urinary tract infection     Enterococcus    Past Surgical History:  Procedure Laterality Date  . CARDIAC CATHETERIZATION  1989  . CARDIAC CATHETERIZATION N/A 01/08/2015   Procedure: Left Heart Cath and Cors/Grafts Angiography;  Surgeon: Peter M Martinique, MD;  Location: [facility];  Service: Cardiovascular;  Laterality: N/A;  .  CARDIAC CATHETERIZATION  01/08/2015   Procedure: Coronary Stent Intervention;  Surgeon: Peter M Martinique, MD;  Location: [facility];  Service: Cardiovascular;;  . CARPAL TUNNEL RELEASE Right 08/2010  . CATARACT EXTRACTION W/ INTRAOCULAR LENS  IMPLANT, BILATERAL Bilateral   . CORONARY ANGIOPLASTY  01/08/15   RCA DES  . CORONARY ARTERY BYPASS GRAFT  1989   "CABG X 2"  . JOINT REPLACEMENT    . TONSILLECTOMY    . TOTAL HIP ARTHROPLASTY Right 2000    There were no vitals filed for this visit.  Subjective Assessment - 02/05/19 1230    Subjective  relays no pain upon arrival and even walking some without cane. has ordered a foot bike for home.    Pertinent History  PMH: lumbar DDD L3-4, CAD,DM,Afib    Limitations  Sitting;Walking;House hold activities    How long can you walk comfortably?  can not walk around the grocery store, has to use scooter due to pain    Diagnostic tests  hip XR "Crescentic approximately 2.6 cm ossicle superior to the rightgreater trochanter may represent the sequela of age-indeterminateavulsive injury/fracture. Correlation for point tenderness at thislocation is advised."    Patient Stated Goals  reduce hip pain    Pain  Onset  More than a month ago                       [facility] Adult PT Treatment/Exercise - 02/05/19 0001      Ambulation/Gait   Gait Comments  150 ft no AD mod I showing improved gait pattern of step length and  velocity      Exercises   Exercises  Knee/Hip      Knee/Hip Exercises: Stretches   Passive Hamstring Stretch  Right;3 reps;30 seconds    ITB Stretch Limitations  passive to Rt 3 reps 30 sec    Other Knee/Hip Stretches  passive hip add stretch 30 sec X 3 on Rt      Knee/Hip Exercises: Aerobic   Nustep  L5 for 7 min UE/LE seat 10      Knee/Hip Exercises: Seated   Other Seated Knee/Hip Exercises  hip IR and ER with L2 band X 20 ea    Hamstring Curl  Right;20 reps    Hamstring Limitations  L2 band    Sit to Sand  2  sets;10 reps;without UE support   from raised mat table     Knee/Hip Exercises: Supine   Hip Adduction Isometric Limitations  ball sq,  5sec hold X 20 reps    Bridges Limitations  bridge with ball sq 2X10    Other Supine Knee/Hip Exercises  supine marches  X20 reps bilat with L2 band around his knees, supine leg press with L4 band X 20 reps    Other Supine Knee/Hip Exercises  supine clam with L2 band 2X10      Knee/Hip Exercises: Sidelying   Clams  for his Rt side 2X10 reps with L2 band      Modalities   Modalities  --   declined today     Manual Therapy   Manual therapy comments  PROM to Right hip all planes, manual stretching for SKTC, H.S, IT band, adductors               PT Short Term Goals - 01/22/19 1419      PT SHORT TERM GOAL #1   Title  Pt will be I and compliant with initial HEP. (Target for STG's 4 weeks 02/21/19)    Status  New      PT SHORT TERM GOAL #2   Title  Pt will report overall 25% reduction in pain    Baseline  8/10 avg        PT Long Term Goals - 01/22/19 1420      PT LONG TERM GOAL #1   Title  Pt will independently perform HEP and verbalize plan for continued exercise post PT DC.   (Target date all LTG's 8 weeks 03/19/19)    Status  New      PT LONG TERM GOAL #2   Title  Pt will improve Rt hip strenght to overall 4+/5 MMT to improve funciton.    Status  New      PT LONG TERM GOAL #3   Title  Pt will improve Rt hip ROM to [facility] greater than 50% ROM.    Status  New      PT LONG TERM GOAL #4   Title  Pt will report overall 50% pain reduction with usual activity.    Baseline  8/10    Status  New      PT LONG TERM GOAL #5   Title  Pt will be able  to ambulate at least 400 ft with LRAD mod I to improve to limited community ambulation    Baseline  50 ft due to pain    Status  New            Plan - 02/05/19 1246    Clinical Impression Statement  He is progressing well with strength, gait, and less pain. He was able to ambulate  without AD today showing less antalgic gait, improved strep length, and improved velocity. He had good tolerance to exercise prgression today. Continue POC    Personal Factors and Comorbidities  Age;Comorbidity 3+;Past/Current Experience;Time since onset of injury/illness/exacerbation    Comorbidities  PMH: lumbar stenosis and DDD L3-4, CAD,DM,Afib    Examination-Activity Limitations  Bed Mobility;Bend;Carry;Lift;Dressing;Stairs;Squat;Stand;Locomotion Level;Transfers    Examination-Participation Restrictions  Meal Prep;Cleaning;Community Activity;Driving;Laundry;Shop    Stability/Clinical Decision Making  Evolving/Moderate complexity    Rehab Potential  Good    PT Frequency  2x / week    PT Duration  8 weeks    PT Treatment/Interventions  ADLs/Self Care Home Management;Aquatic Therapy;Cryotherapy;Electrical Stimulation;Iontophoresis 4mg /ml Dexamethasone;Moist Heat;Traction;Ultrasound;DME Instruction;Gait training;Stair training;Functional mobility training;Therapeutic activities;Therapeutic exercise;Balance training;Neuromuscular re-education;Manual techniques;Passive range of motion;Dry needling;Joint Manipulations;Taping    PT Next Visit Plan  review HEP  an update PRN, progress to standing exercise when able, progress gait as able. consider MT or modalaties for pain, needs Rt hip stretching and strengthening, gentle activity progression    PT Home Exercise Plan  Access Code: [email or web address]    Consulted and Agree with Plan of Care  Patient       Patient will benefit from skilled therapeutic intervention in order to improve the following deficits and impairments:  Abnormal gait, Decreased activity tolerance, Decreased balance, Decreased mobility, Decreased endurance, Decreased range of motion, Decreased strength, Hypomobility, Difficulty walking, Increased muscle spasms, Increased fascial restricitons, Impaired flexibility, Postural dysfunction, Pain, Obesity  Visit Diagnosis: Pain in right  hip  Stiffness of right hip, not elsewhere classified  Muscle weakness (generalized)  Other abnormalities of gait and mobility     Problem List Patient Active Problem List   Diagnosis Date Noted  . Hip pain 01/09/2019  . Spinal stenosis of lumbar region 02/21/2018  . Degeneration of lumbar intervertebral disc 02/21/2018  . History of revision of total replacement of right hip joint 01/03/2018  . Diabetic peripheral neuropathy associated with type 2 diabetes mellitus ([place]) 05/05/2017  . Atherosclerotic heart disease of native coronary artery without angina pectoris 06/13/2016  . CVA (cerebral vascular accident) ([place]) 06/06/2016  . Leukocytosis 06/02/2016  . Carotid stenosis 06/02/2016  . Recurrent coronary arteriosclerosis after percutaneous transluminal coronary angioplasty 04/18/2015  . Epistaxis, recurrent 04/11/2015  . CAD S/P PCI- Nov 2016   . Chronic vertigo 03/15/2014  . Current use of long term anticoagulation 02/06/2013  . Obesity (BMI 30.0-34.9) 01/31/2012  . Osteoarthritis, knee 01/31/2012  . Allergic rhinitis 10/28/2011  . Chronic atrial fibrillation ([place]) 07/30/2011  . Hearing loss 06/08/2011  . Primary osteoarthritis of hand 06/08/2011  . Enlarged prostate without lower urinary tract symptoms (luts) 01/04/2011  . Gastro-esophageal reflux disease without esophagitis 12/07/2010  . Hx of CABG x 2 1990   . Essential (primary) hypertension   . Diabetes mellitus type 2, insulin dependent ([place])   . Hyperlipemia   . Hematuria 08/27/2008    Silvestre Mesi 02/05/2019, 12:48 PM  [facility] Physical Therapy [address] [place], [place], [address] Phone: [phone]   Fax:  [phone]  Name: [name] MRN: [id number] Date of Birth: August 11, 1928

## 2019-02-06 ENCOUNTER — Ambulatory Visit (INDEPENDENT_AMBULATORY_CARE_PROVIDER_SITE_OTHER): Payer: Medicare Other | Admitting: Family Medicine

## 2019-02-06 ENCOUNTER — Encounter: Payer: Self-pay | Admitting: Family Medicine

## 2019-02-06 VITALS — BP 122/68 | HR 81 | Temp 98.1°F | Ht 66.0 in | Wt 188.0 lb

## 2019-02-06 DIAGNOSIS — M25551 Pain in right hip: Secondary | ICD-10-CM | POA: Diagnosis not present

## 2019-02-06 DIAGNOSIS — I482 Chronic atrial fibrillation, unspecified: Secondary | ICD-10-CM

## 2019-02-06 DIAGNOSIS — I2510 Atherosclerotic heart disease of native coronary artery without angina pectoris: Secondary | ICD-10-CM | POA: Diagnosis not present

## 2019-02-06 DIAGNOSIS — E119 Type 2 diabetes mellitus without complications: Secondary | ICD-10-CM | POA: Diagnosis not present

## 2019-02-06 DIAGNOSIS — Z794 Long term (current) use of insulin: Secondary | ICD-10-CM | POA: Diagnosis not present

## 2019-02-06 DIAGNOSIS — I10 Essential (primary) hypertension: Secondary | ICD-10-CM | POA: Diagnosis not present

## 2019-02-06 DIAGNOSIS — Z7901 Long term (current) use of anticoagulants: Secondary | ICD-10-CM | POA: Diagnosis not present

## 2019-02-06 LAB — POCT GLYCOSYLATED HEMOGLOBIN (HGB A1C): Hemoglobin A1C: 8.5 % — AB (ref 4.0–5.6)

## 2019-02-06 NOTE — Progress Notes (Signed)
Subjective  CC:  Chief Complaint  Patient presents with  . Diabetes    HPI: [name] is a 85 y.o. male who presents to the office today for follow up of diabetes and problems listed above in the chief complaint.   Diabetes follow up: His diabetic control is reported as Unchanged. Fasting sugars avg 95! Eating less because gets full more easily now but reports good appetite and feels well. Has lost weight.  He denies exertional CP or SOB or symptomatic hypoglycemia. He denies foot sores or paresthesias. imms are up to date.   HTN: good control. Not on ACE but potassium is borderline high. Renal fxn has been stable.   Right hip pain: going to PT and feeling much better! Sleeping well. Still using the oxycodone prior to bed but not sure he needs it. Burning constant pain is better and his gait is better. Still working on gaining back his strength. Seeing Dr. Erlinda Hong, ortho.   On blood thinner; no falls or bleeding.   Ros: neg cp or palpitations.  Wt Readings from Last 3 Encounters:  02/06/19 188 lb (85.3 kg)  01/15/19 200 lb 3.2 oz (90.8 kg)  01/09/19 190 lb (86.2 kg)    BP Readings from Last 3 Encounters:  02/06/19 122/68  01/15/19 120/70  12/21/18 128/80    Assessment  1. Diabetes mellitus type 2, insulin dependent ([place])   2. Essential (primary) hypertension   3. Chronic atrial fibrillation ([place])   4. Current use of long term anticoagulation   5. Atherosclerosis of native coronary artery of native heart without angina pectoris   6. Right hip pain      Plan   Diabetes is currently adequately controlled. Goal a1c closer to 8 due to age and comorbidities: will check 2 hour postprandials and before bedtime to see if running high later in the day and educated about monitoring for lows at night. No med changes today.   HTN is controlled. No ace at this point.   afib on blood thinners w/ cad: all stable.   Right hip pain: improving. Will stop oxycodone and can use  tramadol or hydrocodone as needed if pain returns. Patient understands and agrees with care plan.    Follow up: 3 months dm/htn/weight recheck.. Orders Placed This Encounter  Procedures  . POCT glycosylated hemoglobin (Hb A1C)   No orders of the defined types were placed in this encounter.     Immunization History  Administered Date(s) Administered  . Fluad Quad(high Dose 65+) 11/02/2018  . Influenza Split 11/14/2007, 01/01/2009, 12/23/2009  . Influenza, High Dose Seasonal PF 11/02/2012, 12/12/2013, 11/15/2014, 11/24/2017  . Influenza, Seasonal, Injecte, Preservative Fre 11/18/2015  . Influenza,trivalent, recombinat, inj, PF 12/07/2010  . Influenza-Unspecified 10/28/2011, 11/30/2014, 02/15/2017  . Pneumococcal Conjugate-13 12/13/2013  . Pneumococcal-Unspecified 08/20/2008  . Td 05/29/2002  . Tdap 10/28/2011, 02/15/2017  . Zoster 08/20/2008  . Zoster Recombinat (Shingrix) 10/13/2016, 12/13/2016    Diabetes Related Lab Review: Lab Results  Component Value Date   HGBA1C 8.5 (A) 02/06/2019   HGBA1C 7.7 (A) 10/04/2018   HGBA1C 8.8 (A) 08/03/2018    Lab Results  Component Value Date   MICROALBUR 3.5 (H) 08/01/2017   Lab Results  Component Value Date   CREATININE 1.21 10/13/2018   BUN 18 10/13/2018   NA 141 10/13/2018   K 5.2 10/13/2018   CL 101 10/13/2018   CO2 24 10/13/2018   Lab Results  Component Value Date   CHOL 143 10/04/2018  CHOL 146 12/07/2017   CHOL 159 07/20/2017   Lab Results  Component Value Date   HDL 44.60 10/04/2018   HDL 44.70 12/07/2017   HDL 42 07/20/2017   Lab Results  Component Value Date   LDLCALC 77 10/04/2018   LDLCALC 85 12/07/2017   LDLCALC 89 07/20/2017   Lab Results  Component Value Date   TRIG 106.0 10/04/2018   TRIG 78.0 12/07/2017   TRIG 139 07/20/2017   Lab Results  Component Value Date   CHOLHDL 3 10/04/2018   CHOLHDL 3 12/07/2017   CHOLHDL 4.0 06/07/2016   No results found for: LDLDIRECT The ASCVD Risk score  Mikey Bussing DC Jr., et al., 2013) failed to calculate for the following reasons:   The 2013 ASCVD risk score is only valid for ages 30 to 28   The patient has a prior MI or stroke diagnosis I have reviewed the PMH, Fam and Soc history. Patient Active Problem List   Diagnosis Date Noted  . Spinal stenosis of lumbar region 02/21/2018    Priority: High  . Diabetic peripheral neuropathy associated with type 2 diabetes mellitus ([place]) 05/05/2017    Priority: High    No pain   . Atherosclerotic heart disease of native coronary artery without angina pectoris 06/13/2016    Priority: High    Overview:  Overview:  RCA DES placed Nov 2016, LIMA-LAD presumed patent based on Myoview but no visualized, SVG-Dx occluded  Last Assessment & Plan:  RCA DES placed Nov 2016, LIMA-LAD presumed patent based on Myoview but no visualized, SVG-Dx occluded   . CVA (cerebral vascular accident) ([place]) 06/06/2016    Priority: High  . Recurrent coronary arteriosclerosis after percutaneous transluminal coronary angioplasty 04/18/2015    Priority: High    Overview:  Overview:  RCA DES placed Nov 2016, LIMA-LAD presumed patent based on Myoview but no visualized, SVG-Dx occluded  Last Assessment & Plan:  RCA DES placed Nov 2016, LIMA-LAD presumed patent based on Myoview but no visualized, SVG-Dx occluded   . CAD S/P PCI- Nov 2016     Priority: High    RCA DES placed Nov 2016, LIMA-LAD presumed patent based on Myoview but no visualized, SVG-Dx occluded   . Current use of long term anticoagulation 02/06/2013    Priority: High    Overview:  Monitored by cardiology   . Chronic atrial fibrillation ([place]) 07/30/2011    Priority: High    CHADs VASc=5 for age, HTN, vascular disease, and DM   . Hx of CABG x 2 1990     Priority: High    Status post CABG x2 in 1990 including an LIMA graft to the LAD, and a vein graft to the diagonal.    . Essential (primary) hypertension     Priority: High  . Diabetes mellitus type 2,  insulin dependent ([place])     Priority: High  . Hyperlipemia     Priority: High  . Degeneration of lumbar intervertebral disc 02/21/2018    Priority: Medium  . Chronic vertigo 03/15/2014    Priority: Medium  . Obesity (BMI 30.0-34.9) 01/31/2012    Priority: Medium  . Enlarged prostate without lower urinary tract symptoms (luts) 01/04/2011    Priority: Medium    Overview:  Urology - avodart and tamuloscin   . Gastro-esophageal reflux disease without esophagitis 12/07/2010    Priority: Medium  . Epistaxis, recurrent 04/11/2015    Priority: Low  . Osteoarthritis, knee 01/31/2012    Priority: Low  . Allergic rhinitis 10/28/2011  Priority: Low  . Hearing loss 06/08/2011    Priority: Low  . Primary osteoarthritis of hand 06/08/2011    Priority: Low  . Hematuria 08/27/2008    Priority: Low    Overview:  Essential Hematuria - urology - [place]  w/u benign  10/1 IMO update Overview:  Overview:  Essential Hematuria - urology - [place]rape[place]  w/u benign   . Hip pain 01/09/2019  . History of revision of total replacement of right hip joint 01/03/2018  . Leukocytosis 06/02/2016  . Carotid stenosis 06/02/2016    Social History: Patient  reports that he quit smoking about 60 years ago. His smoking use included cigarettes. He has a 30.00 pack-year smoking history. He has never used smokeless tobacco. He reports that he does not drink alcohol or use drugs.  Review of Systems: Ophthalmic: negative for eye pain, loss of vision or double vision Cardiovascular: negative for chest pain Respiratory: negative for SOB or persistent cough Gastrointestinal: negative for abdominal pain Genitourinary: negative for dysuria or gross hematuria MSK: negative for foot lesions Neurologic: negative for weakness   Objective  Vitals: BP 122/68 (BP Location: Left Arm, Patient Position: Sitting, Cuff Size: Normal)   Pulse 81   Temp 98.1 F (36.7 C) (Temporal)   Ht 5\' 6"  (1.676 m)   Wt 188 lb (85.3  kg)   SpO2 97%   BMI 30.34 kg/m  General: well appearing, no acute distress , moving quickly today; pain free Psych:  Alert and oriented, normal mood and affect HEENT:  Normocephalic, atraumatic, moist mucous membranes, supple neck  Cardiovascular:  irreg irreg with murmur Respiratory:  Good breath sounds bilaterally, CTAB with normal effort, no rales Skin:  Warm, no rashes or bruising     Diabetic education: ongoing education regarding chronic disease management for diabetes was given today. We continue to reinforce the ABC's of diabetic management: A1c (<7 or 8 dependent upon patient), tight blood pressure control, and cholesterol management with goal LDL < 100 minimally. We discuss diet strategies, exercise recommendations, medication options and possible side effects. At each visit, we review recommended immunizations and preventive care recommendations for diabetics and stress that good diabetic control can prevent other problems. See below for this patient's data.    Commons side effects, risks, benefits, and alternatives for medications and treatment plan prescribed today were discussed, and the patient expressed understanding of the given instructions. Patient is instructed to call or message via MyChart if he/she has any questions or concerns regarding our treatment plan. No barriers to understanding were identified. We discussed Red Flag symptoms and signs in detail. Patient expressed understanding regarding what to do in case of urgent or emergency type symptoms.   Medication list was reconciled, printed and provided to the patient in AVS. Patient instructions and summary information was reviewed with the patient as documented in the AVS. This note was prepared with assistance of Dragon voice recognition software. Occasional wrong-word or sound-a-like substitutions may have occurred due to the inherent limitations of voice recognition software  This visit occurred during the  SARS-CoV-2 public health emergency.  Safety protocols were in place, including screening questions prior to the visit, additional usage of staff PPE, and extensive cleaning of exam room while observing appropriate contact time as indicated for disinfecting solutions.

## 2019-02-06 NOTE — Patient Instructions (Signed)
Please return in 3 months for diabetes follow up  Check your sugars 2 hours after dinner and once in a while check it before bedtime.   Let me know if you need any pain medication; hopefully you won't but I can order hydrocodone or tramadol if you need it.   If you have any questions or concerns, please don't hesitate to send me a message via MyChart or call the office at [phone]. Thank you for visiting with [place] today! It's our pleasure caring for you.

## 2019-02-07 ENCOUNTER — Ambulatory Visit (INDEPENDENT_AMBULATORY_CARE_PROVIDER_SITE_OTHER): Payer: Medicare Other | Admitting: Physical Therapy

## 2019-02-07 ENCOUNTER — Other Ambulatory Visit: Payer: Self-pay

## 2019-02-07 DIAGNOSIS — R2689 Other abnormalities of gait and mobility: Secondary | ICD-10-CM

## 2019-02-07 DIAGNOSIS — M25651 Stiffness of right hip, not elsewhere classified: Secondary | ICD-10-CM

## 2019-02-07 DIAGNOSIS — M6281 Muscle weakness (generalized): Secondary | ICD-10-CM | POA: Diagnosis not present

## 2019-02-07 DIAGNOSIS — M25551 Pain in right hip: Secondary | ICD-10-CM | POA: Diagnosis not present

## 2019-02-07 NOTE — Therapy (Addendum)
[facility] [address] [place], [place], [address] Phone: [phone]   Fax:  [phone]  Physical Therapy Treatment/Discharg addendum PHYSICAL THERAPY DISCHARGE SUMMARY  Visits from Start of Care:5  Current functional level related to goals / functional outcomes: See below   Remaining deficits: See below   Education / Equipment: HEP Plan: Patient agrees to discharge.  Patient goals were not met. Patient is being discharged due to not returning since the last visit.  ?????   Elsie Ra, PT, DPT 05/08/19 1:31 PM      Patient Details  Name: [name] MRN: [id number] Date of Birth: [birth year] Referring Provider (PT): Leandrew Koyanagi, MD   Encounter Date: 02/07/2019  PT End of Session - 02/07/19 1256    Visit Number  5    Number of Visits  16    Date for PT Re-Evaluation  03/19/19    Authorization Type  MCR/BCBS    PT Start Time  1145    PT Stop Time  1230   10 min of heat pre tx   PT Time Calculation (min)  45 min    Activity Tolerance  Patient tolerated treatment well    Behavior During Therapy  [facility] for tasks assessed/performed       Past Medical History:  Diagnosis Date  . Abdominal pain   . Acute renal failure ([place])     resolved  . Arthritis    "hands & legs" (11/'10/2014)  . Ataxia   . Atrial fibrillation ([place])   . Bladder outlet obstruction   . Bladder outlet obstruction   . BPH (benign prostatic hyperplasia)   . CAD (coronary artery disease)    a. CABG IN 1989. b. 01/08/2015 CTO of ost LAD, LIMA to LAD not visualized but assumed patent given myoview finding, occluded SVG to diagonal, 99% mid RCA tx w/ SYNERGY DES 3X28 mm  . Constipation   . Diabetes mellitus type 2, insulin dependent ([place])   . Epistaxis, recurrent Feb 2017  . GERD (gastroesophageal reflux disease)   . HTN (hypertension)   . Hx of bacterial pneumonia   . Hyperlipemia   . Kidney stones   . Mild aortic stenosis   . Rib fractures    left  rib fractures being treated with pain medications  . Stented coronary artery Nov 2016   RCA DES  . Urinary tract infection     Enterococcus    Past Surgical History:  Procedure Laterality Date  . CARDIAC CATHETERIZATION  1989  . CARDIAC CATHETERIZATION N/A 01/08/2015   Procedure: Left Heart Cath and Cors/Grafts Angiography;  Surgeon: Peter M Martinique, MD;  Location: [place] [facility];  Service: Cardiovascular;  Laterality: N/A;  . CARDIAC CATHETERIZATION  01/08/2015   Procedure: Coronary Stent Intervention;  Surgeon: Peter M Martinique, MD;  Location: [place] [facility];  Service: Cardiovascular;;  . CARPAL TUNNEL RELEASE Right 08/2010  . CATARACT EXTRACTION W/ INTRAOCULAR LENS  IMPLANT, BILATERAL Bilateral   . CORONARY ANGIOPLASTY  01/08/15   RCA DES  . CORONARY ARTERY BYPASS GRAFT  1989   "CABG X 2"  . JOINT REPLACEMENT    . TONSILLECTOMY    . TOTAL HIP ARTHROPLASTY Right 2000    There were no vitals filed for this visit.  Subjective Assessment - 02/07/19 1217    Subjective  relays Rt hip pain is 5/10 today, he was feeling great and walking some without cane, then yesterday afternoon started having pain, pins and needle sensations in his  hip and down his lateral leg, not able to sleep much last night due to this and still having the pain and pins/needles upon arrival.    Pertinent History  PMH: lumbar DDD L3-4, CAD,DM,Afib    Limitations  Sitting;Walking;House hold activities    How long can you walk comfortably?  can not walk around the grocery store, has to use scooter due to pain    Diagnostic tests  hip XR "Crescentic approximately 2.6 cm ossicle superior to the rightgreater trochanter may represent the sequela of age-indeterminateavulsive injury/fracture. Correlation for point tenderness at thislocation is advised."    Patient Stated Goals  reduce hip pain                       [facility] Adult PT Treatment/Exercise - 02/07/19 0001      Ambulation/Gait   Gait Comments   held ambulation today due to pain and antlagic gait      Knee/Hip Exercises: Stretches   Passive Hamstring Stretch  Right;3 reps;30 seconds    ITB Stretch Limitations  passive to Rt 3 reps 30 sec    Other Knee/Hip Stretches  seated Pball roll outs into flexion then flexion plus diagonal to Lt 10 sec X 10 reps for facet gappin      Knee/Hip Exercises: Aerobic   Nustep  L3 for 7 min UE/LE      Knee/Hip Exercises: Supine   Hip Adduction Isometric Limitations  ball sq,  5sec hold X 20 reps      Modalities   Modalities  Moist Heat      Moist Heat Therapy   Number Minutes Moist Heat  10 Minutes    Moist Heat Location  Hip;Lumbar Spine      Manual Therapy   Manual therapy comments  PROM to Right hip all planes, manual stretching for SKTC, H.S, IT band,, piriformis, long axis distraction in both ER and IR, STM/IASTM/T.P relase to Rt lumbar P.S, glutes, TFL,               PT Short Term Goals - 01/22/19 1419      PT SHORT TERM GOAL #1   Title  Pt will be I and compliant with initial HEP. (Target for STG's 4 weeks 02/21/19)    Status  New      PT SHORT TERM GOAL #2   Title  Pt will report overall 25% reduction in pain    Baseline  8/10 avg        PT Long Term Goals - 01/22/19 1420      PT LONG TERM GOAL #1   Title  Pt will independently perform HEP and verbalize plan for continued exercise post PT DC.   (Target date all LTG's 8 weeks 03/19/19)    Status  New      PT LONG TERM GOAL #2   Title  Pt will improve Rt hip strenght to overall 4+/5 MMT to improve funciton.    Status  New      PT LONG TERM GOAL #3   Title  Pt will improve Rt hip ROM to [facility] greater than 50% ROM.    Status  New      PT LONG TERM GOAL #4   Title  Pt will report overall 50% pain reduction with usual activity.    Baseline  8/10    Status  New      PT LONG TERM GOAL #5   Title  Pt will be  able to ambulate at least 400 ft with LRAD mod I to improve to limited community ambulation    Baseline  50  ft due to pain    Status  New            Plan - 02/07/19 1258    Clinical Impression Statement  he had flare up in hip pain with sensations of pins and needles down his Lt latreal leg  that may be due to lumbar radiculopathy or muscle spasm so strengthening held and instead focused on lumbar/hip ROM and stretching, and manual therapy today which did reduce overall radicular/referred pain. PT will progress back strength when able and monitor these radicular type symptoms.    Personal Factors and Comorbidities  Age;Comorbidity 3+;Past/Current Experience;Time since onset of injury/illness/exacerbation    Comorbidities  PMH: lumbar stenosis and DDD L3-4, CAD,DM,Afib    Examination-Activity Limitations  Bed Mobility;Bend;Carry;Lift;Dressing;Stairs;Squat;Stand;Locomotion Level;Transfers    Examination-Participation Restrictions  Meal Prep;Cleaning;Community Activity;Driving;Laundry;Shop    Stability/Clinical Decision Making  Evolving/Moderate complexity    Rehab Potential  Good    PT Frequency  2x / week    PT Duration  8 weeks    PT Treatment/Interventions  ADLs/Self Care Home Management;Aquatic Therapy;Cryotherapy;Electrical Stimulation;Iontophoresis 30m/ml Dexamethasone;Moist Heat;Traction;Ultrasound;DME Instruction;Gait training;Stair training;Functional mobility training;Therapeutic activities;Therapeutic exercise;Balance training;Neuromuscular re-education;Manual techniques;Passive range of motion;Dry needling;Joint Manipulations;Taping    PT Next Visit Plan  review HEP  an update PRN, progress to standing exercise when able, progress gait as able. consider MT or modalaties for pain, needs Rt hip stretching and strengthening, gentle activity progression    PT Home Exercise Plan  Access Code: [email or web address]   Consulted and Agree with Plan of Care  Patient       Patient will benefit from skilled therapeutic intervention in order to improve the following deficits and impairments:  Abnormal gait,  Decreased activity tolerance, Decreased balance, Decreased mobility, Decreased endurance, Decreased range of motion, Decreased strength, Hypomobility, Difficulty walking, Increased muscle spasms, Increased fascial restricitons, Impaired flexibility, Postural dysfunction, Pain, Obesity  Visit Diagnosis: Pain in right hip  Stiffness of right hip, not elsewhere classified  Muscle weakness (generalized)  Other abnormalities of gait and mobility     Problem List Patient Active Problem List   Diagnosis Date Noted  . Hip pain 01/09/2019  . Spinal stenosis of lumbar region 02/21/2018  . Degeneration of lumbar intervertebral disc 02/21/2018  . History of revision of total replacement of right hip joint 01/03/2018  . Diabetic peripheral neuropathy associated with type 2 diabetes mellitus ([place] 05/05/2017  . Atherosclerotic heart disease of native coronary artery without angina pectoris 06/13/2016  . CVA (cerebral vascular accident) ([place] 06/06/2016  . Leukocytosis 06/02/2016  . Carotid stenosis 06/02/2016  . Recurrent coronary arteriosclerosis after percutaneous transluminal coronary angioplasty 04/18/2015  . Epistaxis, recurrent 04/11/2015  . CAD S/P PCI- Nov 2016   . Chronic vertigo 03/15/2014  . Current use of long term anticoagulation 02/06/2013  . Obesity (BMI 30.0-34.9) 01/31/2012  . Osteoarthritis, knee 01/31/2012  . Allergic rhinitis 10/28/2011  . Chronic atrial fibrillation ([place] 07/30/2011  . Hearing loss 06/08/2011  . Primary osteoarthritis of hand 06/08/2011  . Enlarged prostate without lower urinary tract symptoms (luts) 01/04/2011  . Gastro-esophageal reflux disease without esophagitis 12/07/2010  . Hx of CABG x 2 1990   . Essential (primary) hypertension   . Diabetes mellitus type 2, insulin dependent ([place]   . Hyperlipemia   . Hematuria 08/27/2008    BMarrianne MoodNelson,PT,DPT 02/07/2019, 1:00 PM  [place]  [facility] Therapy [address] [place], [place], [address] Phone: [phone]   Fax:  [phone]  Name: [name] MRN: [id number] Date of Birth: June 15, 1928

## 2019-02-09 ENCOUNTER — Telehealth: Payer: Self-pay

## 2019-02-09 NOTE — Telephone Encounter (Signed)
Patient called triage phone stating that he was seen by Dr Erlinda Hong about a month ago and he referred him to P.T. He has been going to his appointments but does not feel like that he is getting any better.  He wanted to be worked in today due to severe pain but I advised Dr Erlinda Hong was booked and he asked for return call to be advised what he can do about the amount of pain he is having. Please call patient to discuss [phone]

## 2019-02-09 NOTE — Telephone Encounter (Signed)
Please advise 

## 2019-02-09 NOTE — Telephone Encounter (Signed)
He should come in for evaluation.

## 2019-02-09 NOTE — Telephone Encounter (Signed)
Called pt and made an appt for 02/15/2019 but pt was very upset that he has to wait 6 days to be seen. Pt states he cannot sleep at night and is taking oxycodone but it isn't helping his pain, asked me to send a message to Dr.Xu about working him in for 02/13/2019. Please give him a call   [phone]

## 2019-02-09 NOTE — Telephone Encounter (Signed)
Please make him an appt. Thank you.

## 2019-02-12 ENCOUNTER — Encounter: Payer: Medicare Other | Admitting: Physical Therapy

## 2019-02-12 NOTE — Telephone Encounter (Signed)
Patient will be coming in tomorrow.

## 2019-02-12 NOTE — Telephone Encounter (Signed)
Sure - that's fine

## 2019-02-12 NOTE — Telephone Encounter (Signed)
Tried calling patient again no answer. LMOM

## 2019-02-12 NOTE — Telephone Encounter (Signed)
See message.

## 2019-02-12 NOTE — Telephone Encounter (Signed)
Tried calling patient no answer.  He can come in at 2pm tomorrow. There is a cancellation. Let me know if he calls back so I can cx the other patient.

## 2019-02-13 ENCOUNTER — Other Ambulatory Visit: Payer: Self-pay

## 2019-02-13 ENCOUNTER — Encounter: Payer: Self-pay | Admitting: Orthopaedic Surgery

## 2019-02-13 ENCOUNTER — Ambulatory Visit (INDEPENDENT_AMBULATORY_CARE_PROVIDER_SITE_OTHER): Payer: Medicare Other | Admitting: Orthopaedic Surgery

## 2019-02-13 ENCOUNTER — Telehealth: Payer: Self-pay | Admitting: Family Medicine

## 2019-02-13 ENCOUNTER — Ambulatory Visit: Payer: Self-pay

## 2019-02-13 DIAGNOSIS — M25551 Pain in right hip: Secondary | ICD-10-CM | POA: Diagnosis not present

## 2019-02-13 DIAGNOSIS — I6523 Occlusion and stenosis of bilateral carotid arteries: Secondary | ICD-10-CM

## 2019-02-13 NOTE — Progress Notes (Signed)
Subjective: He is here for diagnostic ultrasound and possible injection.  Chronic greater trochanter pain, previously would get good relief with injections but they seemed to stop working.  He came here for another opinion.  Objective: He is point tender over the greater trochanter of the right hip.  Limited diagnostic ultrasound: No definite tendon tear seen.  He has some tendinopathy changes with calcifications and irregularity of the greater trochanter.  Procedure: Right hip greater trochanter injection: After sterile prep with Betadine, and injected 8 cc 1% lidocaine without epinephrine and 40 mg methylprednisolone using a 22-gauge spinal needle, passing the needle into the area of maximum tenderness.  He had excellent immediate pain relief.  He will follow-up as needed.

## 2019-02-13 NOTE — Telephone Encounter (Signed)
See below

## 2019-02-13 NOTE — Telephone Encounter (Signed)
Copied from [place] [id number]. Topic: General - Other >> Feb 13, 2019 11:20 AM Keene Breath wrote: Reason for CRM: Called to request that the doctor fax the patient's A1C and kidney function test results to [facility], at Fax# [phone]

## 2019-02-13 NOTE — Progress Notes (Signed)
Office Visit Note   Patient: [name]           Date of Birth: [birth year]           MRN: [id number] Visit Date: 02/13/2019              Requested by: [name], [address] [place],  [place] [address] PCP: [name], MD   Assessment & Plan: Visit Diagnoses:  1. Pain in right hip     Plan: Impression is right hip trochanteric bursitis/abductor tendinitis and possible tear as well as polywear from his previous right total hip replacement.  We will refer the patient to Dr. Junius Roads for diagnostic ultrasound to the right lateral hip followed by possible cortisone injection.  The patient will back off physical therapy to once a week.  He will follow-up with [place] as needed.  Follow-Up Instructions: Return if symptoms worsen or fail to improve.   Orders:  No orders of the defined types were placed in this encounter.  No orders of the defined types were placed in this encounter.     Procedures: No procedures performed   Clinical Data: No additional findings.   Subjective: Chief Complaint  Patient presents with  . Right Hip - Follow-up    HPI patient is a pleasant 85 year old gentleman who presents our clinic today with continued right lateral hip pain.  History of right total hip replacement by Dr. Reynaldo Minium approximately 20 years ago with evidence of polywear.  He was seen by Dr. Erlinda Hong recently with possible right hip abductor tendinitis.  He was started in physical therapy.  He has been going twice a week and notes that his pain is significantly worse following physical therapy.  Majority of his pain is to the right lateral hip, but he notes pain to the groin following physical therapy.  He has to use a heating pad which provides minimal relief.  He does note a history of previous cortisone injections to the lateral hip which came to a point of no longer providing relief.  He has also been worked up for his back which was negative.  Review of Systems as  detailed in HPI.  All others reviewed and are negative.   Objective: Vital Signs: There were no vitals taken for this visit.  Physical Exam well-developed well-nourished gentleman in no acute distress.  Alert oriented x3.  Ortho Exam examination of the right hip reveals moderate tenderness to the trochanteric bursa as well as the abductors.  He has increased pain with logroll.  Increased pain with hip abduction and abduction.  He is neurovascularly intact distally.  Specialty Comments:  No specialty comments available.  Imaging: No new imaging   PMFS History: Patient Active Problem List   Diagnosis Date Noted  . Hip pain 01/09/2019  . Spinal stenosis of lumbar region 02/21/2018  . Degeneration of lumbar intervertebral disc 02/21/2018  . History of revision of total replacement of right hip joint 01/03/2018  . Diabetic peripheral neuropathy associated with type 2 diabetes mellitus ([place]) 05/05/2017  . Atherosclerotic heart disease of native coronary artery without angina pectoris 06/13/2016  . CVA (cerebral vascular accident) ([place]) 06/06/2016  . Leukocytosis 06/02/2016  . Carotid stenosis 06/02/2016  . Recurrent coronary arteriosclerosis after percutaneous transluminal coronary angioplasty 04/18/2015  . Epistaxis, recurrent 04/11/2015  . CAD S/P PCI- Nov 2016   . Chronic vertigo 03/15/2014  . Current use of long term anticoagulation 02/06/2013  . Obesity (BMI 30.0-34.9) 01/31/2012  .  Osteoarthritis, knee 01/31/2012  . Allergic rhinitis 10/28/2011  . Chronic atrial fibrillation ([place]) 07/30/2011  . Hearing loss 06/08/2011  . Primary osteoarthritis of hand 06/08/2011  . Enlarged prostate without lower urinary tract symptoms (luts) 01/04/2011  . Gastro-esophageal reflux disease without esophagitis 12/07/2010  . Hx of CABG x 2 1990   . Essential (primary) hypertension   . Diabetes mellitus type 2, insulin dependent ([place])   . Hyperlipemia   . Hematuria 08/27/2008   Past  Medical History:  Diagnosis Date  . Abdominal pain   . Acute renal failure ([place])     resolved  . Arthritis    "hands & legs" (11/'10/2014)  . Ataxia   . Atrial fibrillation ([place])   . Bladder outlet obstruction   . Bladder outlet obstruction   . BPH (benign prostatic hyperplasia)   . CAD (coronary artery disease)    a. CABG IN 1989. b. 01/08/2015 CTO of ost LAD, LIMA to LAD not visualized but assumed patent given myoview finding, occluded SVG to diagonal, 99% mid RCA tx w/ SYNERGY DES 3X28 mm  . Constipation   . Diabetes mellitus type 2, insulin dependent ([place])   . Epistaxis, recurrent Feb 2017  . GERD (gastroesophageal reflux disease)   . HTN (hypertension)   . Hx of bacterial pneumonia   . Hyperlipemia   . Kidney stones   . Mild aortic stenosis   . Rib fractures    left rib fractures being treated with pain medications  . Stented coronary artery Nov 2016   RCA DES  . Urinary tract infection     Enterococcus    Family History  Problem Relation Age of Onset  . Brain cancer Father   . Throat cancer Brother     Past Surgical History:  Procedure Laterality Date  . CARDIAC CATHETERIZATION  1989  . CARDIAC CATHETERIZATION N/A 01/08/2015   Procedure: Left Heart Cath and Cors/Grafts Angiography;  Surgeon: Peter M Martinique, MD;  Location: [facility];  Service: Cardiovascular;  Laterality: N/A;  . CARDIAC CATHETERIZATION  01/08/2015   Procedure: Coronary Stent Intervention;  Surgeon: Peter M Martinique, MD;  Location: [facility];  Service: Cardiovascular;;  . CARPAL TUNNEL RELEASE Right 08/2010  . CATARACT EXTRACTION W/ INTRAOCULAR LENS  IMPLANT, BILATERAL Bilateral   . CORONARY ANGIOPLASTY  01/08/15   RCA DES  . CORONARY ARTERY BYPASS GRAFT  1989   "CABG X 2"  . JOINT REPLACEMENT    . TONSILLECTOMY    . TOTAL HIP ARTHROPLASTY Right 2000   Social History   Occupational History  . Occupation: Agricultural consultant  Tobacco Use  . Smoking status: Former Smoker     Packs/day: 3.00    Years: 10.00    Pack years: 30.00    Types: Cigarettes    Quit date: 05/11/1958    Years since quitting: 60.8  . Smokeless tobacco: Never Used  Substance and Sexual Activity  . Alcohol use: No  . Drug use: No  . Sexual activity: Not Currently

## 2019-02-13 NOTE — Telephone Encounter (Signed)
Labs have been faxed as requested

## 2019-02-14 ENCOUNTER — Encounter: Payer: Medicare Other | Admitting: Physical Therapy

## 2019-02-15 ENCOUNTER — Telehealth: Payer: Self-pay | Admitting: Orthopaedic Surgery

## 2019-02-15 ENCOUNTER — Ambulatory Visit: Payer: Medicare Other | Admitting: Orthopaedic Surgery

## 2019-02-15 NOTE — Telephone Encounter (Signed)
Called patient back, advised him  to rest, ice, elevate and to give it about 2 weeks so the injection can kick in and see if it helps. Told him to Take OTC Meds if he is able to. He states he takes Hydrocodone and Oxycodone at night PRN. States he has a lot of pain. Doing a little better today after icing it down some. He will call us back if not any better.

## 2019-02-15 NOTE — Telephone Encounter (Signed)
Patient's wife Lelon Frohlich called requesting that Dr. Erlinda Hong nurse call patient and wife back. Patient phone number is [phone].

## 2019-02-19 ENCOUNTER — Telehealth: Payer: Self-pay

## 2019-02-19 ENCOUNTER — Encounter: Payer: Medicare Other | Admitting: Physical Therapy

## 2019-02-19 ENCOUNTER — Other Ambulatory Visit: Payer: Self-pay | Admitting: Family Medicine

## 2019-02-19 MED ORDER — OXYCODONE-ACETAMINOPHEN 10-325 MG PO TABS: 1.0000 | ORAL_TABLET | Freq: Two times a day (BID) | ORAL | 0 refills | Status: DC | PRN

## 2019-02-19 NOTE — Telephone Encounter (Signed)
Patient's medication sent in to [facility] by Dr. Jonni Sanger. Patient scheduled for an appointment.

## 2019-02-19 NOTE — Telephone Encounter (Signed)
Copied from [place] [id number]. Topic: Appointment Scheduling - Scheduling Inquiry for Clinic >> Feb 19, 2019  2:40 PM Yvette Rack wrote: Reason for CRM: Pt requests appt with Dr. Jonni Sanger before Christmas. Informed pt that there is no appt available this week. Pt stated if he does not hear back from Dr. Jonni Sanger today regarding an appt he will just show up at the office tomorrow morning. Pt requests call back

## 2019-02-19 NOTE — Telephone Encounter (Signed)
Patient wants to be seen tomorrow by Dr. Jonni Sanger for hip pain and pain med refill. Patient notified that she is only here tomorrow and is off the rest of the week. I will discuss with Dr. Jonni Sanger and call him back if there is availability.

## 2019-02-21 ENCOUNTER — Encounter: Payer: Medicare Other | Admitting: Physical Therapy

## 2019-02-22 ENCOUNTER — Encounter: Payer: Self-pay | Admitting: Family Medicine

## 2019-02-26 ENCOUNTER — Encounter: Payer: Medicare Other | Admitting: Physical Therapy

## 2019-02-28 ENCOUNTER — Encounter: Payer: Medicare Other | Admitting: Physical Therapy

## 2019-03-14 ENCOUNTER — Ambulatory Visit (INDEPENDENT_AMBULATORY_CARE_PROVIDER_SITE_OTHER): Payer: Medicare Other | Admitting: Family Medicine

## 2019-03-14 ENCOUNTER — Encounter: Payer: Self-pay | Admitting: Family Medicine

## 2019-03-14 VITALS — BP 154/68 | HR 62 | Temp 98.2°F | Ht 66.0 in | Wt 188.0 lb

## 2019-03-14 DIAGNOSIS — R600 Localized edema: Secondary | ICD-10-CM | POA: Diagnosis not present

## 2019-03-14 DIAGNOSIS — I10 Essential (primary) hypertension: Secondary | ICD-10-CM

## 2019-03-14 DIAGNOSIS — Z794 Long term (current) use of insulin: Secondary | ICD-10-CM | POA: Diagnosis not present

## 2019-03-14 DIAGNOSIS — I5032 Chronic diastolic (congestive) heart failure: Secondary | ICD-10-CM

## 2019-03-14 DIAGNOSIS — E119 Type 2 diabetes mellitus without complications: Secondary | ICD-10-CM | POA: Diagnosis not present

## 2019-03-14 DIAGNOSIS — M7061 Trochanteric bursitis, right hip: Secondary | ICD-10-CM

## 2019-03-14 LAB — COMPREHENSIVE METABOLIC PANEL
ALT: 9 U/L (ref 0–53)
AST: 12 U/L (ref 0–37)
Albumin: 4.4 g/dL (ref 3.5–5.2)
Alkaline Phosphatase: 71 U/L (ref 39–117)
BUN: 16 mg/dL (ref 6–23)
CO2: 33 mEq/L — ABNORMAL HIGH (ref 19–32)
Calcium: 9.7 mg/dL (ref 8.4–10.5)
Chloride: 103 mEq/L (ref 96–112)
Creatinine, Ser: 1.02 mg/dL (ref 0.40–1.50)
GFR: 68.8 mL/min (ref >60.00)
Glucose, Bld: 149 mg/dL — ABNORMAL HIGH (ref 70–99)
Potassium: 4.3 mEq/L (ref 3.5–5.1)
Sodium: 143 mEq/L (ref 135–145)
Total Bilirubin: 0.8 mg/dL (ref 0.2–1.2)
Total Protein: 6.7 g/dL (ref 6.0–8.3)

## 2019-03-14 LAB — MICROALBUMIN / CREATININE URINE RATIO
Creatinine,U: 30 mg/dL
Microalb Creat Ratio: 2.3 mg/g (ref 0.0–30.0)
Microalb, Ur: <0.7 mg/dL (ref 0.0–1.9)

## 2019-03-14 NOTE — Progress Notes (Signed)
Subjective  CC:  Chief Complaint  Patient presents with  . Hip Pain  . Diabetes  . Leg Swelling    HPI: [name] is a 86 y.o. male who presents to the office today to address the problems listed above in the chief complaint.  Hip pain: Recently given steroid injection for right trochanteric bursitis.  Fortunately this pain is now resolved.  No longer needing chronic pain medicines.  He knows this may wear off in the future but now he is ambulatory and feeling much better.  Sleeping well.  Follow-up diabetes: Fasting sugars remain well controlled.  He has forgotten to check 2-hour postprandials.  We discussed how he can remember these.  No symptoms of low blood sugars.  Continues to have peripheral neuropathy symptoms at times on gabapentin.  Left lower extremity edema: I reviewed cardiology notes.  He was seen in August for worsening dependent edema, likely worse in the summer heat.  He was started on Lasix 40 mg daily with 40 mEq of potassium supplementation.  He has been on this regularly and continues to do well.  He notes he will have some swelling in the evening resolved with elevation.  Most recent BUN/creatinine potassium was done in August.  His potassium was 5.2 at that time.  This is thought due to chronic diastolic heart dysfunction.  His weight remained stable  Hypertension: Control remains good.  He is not on an ACE inhibitor although he could benefit from this due to his diabetes.   Wt Readings from Last 3 Encounters:  03/14/19 188 lb (85.3 kg)  02/06/19 188 lb (85.3 kg)  01/15/19 200 lb 3.2 oz (90.8 kg)    Assessment  1. Essential (primary) hypertension   2. Diabetes mellitus type 2, insulin dependent ([name])   3. Chronic diastolic CHF (congestive heart failure) ([name])   4. Lower extremity edema   5. Greater trochanteric bursitis of right hip      Plan   Hypertension: Well-controlled but will consider adjusting medications to include an ACE inhibitor.  Check  urine microalbuminuria today.  Need to be careful due to him being on SGLT2 inhibitor, Lasix with potassium supplementation.  Will adjust medications accordingly to avoid hyperkalemia  Type 2 diabetes: Fairly well controlled.  He is at goal for his age however I would like to check 2-hour postprandials to ensure he is not having hypoglycemia post meals.  He will try to get this done for me.  We will follow-up at next visit next month.  Chronic diastolic heart failure and left extremity edema: Improved on Lasix.  Check potassium.  Adjust dose as needed.  Hip bursitis and back pain much improved.  Continues to follow with orthopedics.  Educated them repeated steroid injections may be affecting his A1c and may be the cause for his mild elevation recently.  Follow up: As scheduled 05/22/2019 for diabetes recheck Orders Placed This Encounter  Procedures  . Urine MAC  . CMP   No orders of the defined types were placed in this encounter.     I reviewed the patients updated PMH, FH, and SocHx.    Patient Active Problem List   Diagnosis Date Noted  . Spinal stenosis of lumbar region 02/21/2018    Priority: High  . Diabetic peripheral neuropathy associated with type 2 diabetes mellitus ([name]) 05/05/2017    Priority: High  . Atherosclerotic heart disease of native coronary artery without angina pectoris 06/13/2016    Priority: High  . CVA (cerebral  vascular accident) ([name]) 06/06/2016    Priority: High  . Recurrent coronary arteriosclerosis after percutaneous transluminal coronary angioplasty 04/18/2015    Priority: High  . CAD S/P PCI- Nov 2016     Priority: High  . Current use of long term anticoagulation 02/06/2013    Priority: High  . Chronic atrial fibrillation ([place]) 07/30/2011    Priority: High  . Hx of CABG x 2 1990     Priority: High  . Essential (primary) hypertension     Priority: High  . Diabetes mellitus type 2, insulin dependent ([name])     Priority: High  . Hyperlipemia      Priority: High  . Degeneration of lumbar intervertebral disc 02/21/2018    Priority: Medium  . Chronic vertigo 03/15/2014    Priority: Medium  . Obesity (BMI 30.0-34.9) 01/31/2012    Priority: Medium  . Enlarged prostate without lower urinary tract symptoms (luts) 01/04/2011    Priority: Medium  . Gastro-esophageal reflux disease without esophagitis 12/07/2010    Priority: Medium  . Epistaxis, recurrent 04/11/2015    Priority: Low  . Osteoarthritis, knee 01/31/2012    Priority: Low  . Allergic rhinitis 10/28/2011    Priority: Low  . Hearing loss 06/08/2011    Priority: Low  . Primary osteoarthritis of hand 06/08/2011    Priority: Low  . Hematuria 08/27/2008    Priority: Low  . Chronic diastolic CHF (congestive heart failure) ([name]) 03/14/2019  . Lower extremity edema 03/14/2019  . Hip pain 01/09/2019  . History of revision of total replacement of right hip joint 01/03/2018  . Leukocytosis 06/02/2016  . Carotid stenosis 06/02/2016   Current Meds  Medication Sig  . amoxicillin (AMOXIL) 500 MG capsule Take 4 caps by mouth 1 hours prior to dental procedure  . apixaban (ELIQUIS) 5 MG TABS tablet Take 1 tablet (5 mg total) by mouth 2 (two) times daily.  [name] atorvastatin (LIPITOR) 40 MG tablet Take 1 tablet (40 mg total) by mouth daily at 6 PM.  . AVODART 0.5 MG capsule Take 0.5 mg by mouth every other day.   . diltiazem (CARDIZEM CD) 360 MG 24 hr capsule Take 1 capsule (360 mg total) by mouth daily.  . empagliflozin (JARDIANCE) 10 MG TABS tablet Take 10 mg by mouth daily.  . furosemide (LASIX) 40 MG tablet Take 40 mg by mouth.  . gabapentin (NEURONTIN) 300 MG capsule Take 1 capsule (300 mg total) by mouth 3 (three) times daily.  . insulin glargine (LANTUS) 100 UNIT/ML injection Inject 35 Units into the skin daily.  . metFORMIN (GLUCOPHAGE) 500 MG tablet Take 500 mg by mouth 2 (two) times daily with a meal.  . metoprolol succinate (TOPROL-XL) 100 MG 24 hr tablet TAKE 1 TABLET BY  MOUTH TWICE DAILY  . nitroGLYCERIN (NITROSTAT) 0.4 MG SL tablet Place 0.4 mg under the tongue every 5 (five) minutes as needed for chest pain (x 3 doses).  . pantoprazole (PROTONIX) 40 MG tablet TAKE 1 TABLET BY MOUTH DAILY AT NOON  . potassium chloride (KLOR-CON) 20 MEQ packet Take by mouth 2 (two) times daily.  [name] PRECISION XTRA TEST STRIPS test strip TEST BLOOD SUGAR 1-2 TIMES DAILY  . Tamsulosin HCl (FLOMAX) 0.4 MG CAPS Take 0.4 mg by mouth every other day.     Allergies: Patient is allergic to erythromycin base; cefadroxil; ciprofloxacin; and erythromycin. Family History: Patient family history includes Brain cancer in his father; Throat cancer in his brother. Social History:  Patient  reports that  he quit smoking about 60 years ago. His smoking use included cigarettes. He has a 30.00 pack-year smoking history. He has never used smokeless tobacco. He reports that he does not drink alcohol or use drugs.  Review of Systems: Constitutional: Negative for fever malaise or anorexia Cardiovascular: negative for chest pain Respiratory: negative for SOB or persistent cough Gastrointestinal: negative for abdominal pain  Objective  Vitals: BP (!) 154/68 (BP Location: Right Arm, Patient Position: Sitting, Cuff Size: Normal)   Pulse 62   Temp 98.2 F (36.8 C) (Temporal)   Ht _0  (1.676 m)   Wt 188 lb (85.3 kg)   SpO2 95%   BMI 30.34 kg/m  General: no acute distress , A&Ox3 Cardiovascular: Irregularly irregular Respiratory:  Good breath sounds bilaterally, CTAB with normal respiratory effort Skin:  Warm, no rashes No bilateral lower extremity edema     Commons side effects, risks, benefits, and alternatives for medications and treatment plan prescribed today were discussed, and the patient expressed understanding of the given instructions. Patient is instructed to call or message via MyChart if he/she has any questions or concerns regarding our treatment plan. No barriers to  understanding were identified. We discussed Red Flag symptoms and signs in detail. Patient expressed understanding regarding what to do in case of urgent or emergency type symptoms.   Medication list was reconciled, printed and provided to the patient in AVS. Patient instructions and summary information was reviewed with the patient as documented in the AVS. This note was prepared with assistance of Dragon voice recognition software. Occasional wrong-word or sound-a-like substitutions may have occurred due to the inherent limitations of voice recognition software  This visit occurred during the SARS-CoV-2 public health emergency.  Safety protocols were in place, including screening questions prior to the visit, additional usage of staff PPE, and extensive cleaning of exam room while observing appropriate contact time as indicated for disinfecting solutions.

## 2019-03-14 NOTE — Patient Instructions (Signed)
Please follow up as scheduled for your next visit with me: 05/22/2019   I will send your kidney function results to the [place]. I will let you know if you should continue taking the furosemide and potassium the same way after your blood work returns.  I may add a medication to help protect your kidneys.   I'm so glad you are doing well.   If you have any questions or concerns, please don't hesitate to send me a message via MyChart or call the office at [phone]. Thank you for visiting with [place] today! It's our pleasure caring for you.

## 2019-03-14 NOTE — Progress Notes (Signed)
Please call patient: I have reviewed his/her lab results. Labs are stable. No need to change any medications at this time. Continue taking the furosemide and potassium. thanks

## 2019-03-23 ENCOUNTER — Ambulatory Visit: Payer: Medicare Other | Attending: Internal Medicine

## 2019-03-23 DIAGNOSIS — Z23 Encounter for immunization: Secondary | ICD-10-CM | POA: Insufficient documentation

## 2019-03-23 NOTE — Progress Notes (Signed)
   Covid-19 Vaccination Clinic  Name:  [name]    MRN: [id number] DOB: [birth year]  03/23/2019  [name] was observed post Covid-19 immunization for 15 minutes without incidence. He was provided with Vaccine Information Sheet and instruction to access the V-Safe system.   [name] was instructed to call 911 with any severe reactions post vaccine: [name] Difficulty breathing  . Swelling of your face and throat  . A fast heartbeat  . A bad rash all over your body  . Dizziness and weakness    Immunizations Administered    Name Date Dose VIS Date Route   Pfizer COVID-19 Vaccine 03/23/2019  1:45 PM 0.3 mL 02/09/2019 Intramuscular   Manufacturer: [place]   Lot: [id number]   [place]: [id number]

## 2019-03-30 ENCOUNTER — Ambulatory Visit (INDEPENDENT_AMBULATORY_CARE_PROVIDER_SITE_OTHER): Payer: Medicare Other | Admitting: Family Medicine

## 2019-03-30 ENCOUNTER — Other Ambulatory Visit: Payer: Self-pay

## 2019-03-30 ENCOUNTER — Encounter: Payer: Self-pay | Admitting: Family Medicine

## 2019-03-30 VITALS — BP 132/64 | HR 74 | Temp 98.1°F | Ht 66.0 in | Wt 188.0 lb

## 2019-03-30 DIAGNOSIS — Z794 Long term (current) use of insulin: Secondary | ICD-10-CM

## 2019-03-30 DIAGNOSIS — E119 Type 2 diabetes mellitus without complications: Secondary | ICD-10-CM

## 2019-03-30 NOTE — Progress Notes (Signed)
Subjective  CC:  Chief Complaint  Patient presents with  . Diabetes    Patient checks his readings at home. His sugar levels are starting to increase at night. No changes in diet. Patient takes 35 units of insulin in the morning and Jardiance.    HPI: [name] is a 86 y.o. male who presents to the office today for follow up of diabetes and problems listed above in the chief complaint.   Diabetes follow up: His diabetic control is reported as Unchanged. However he started checking his 2 hours postprandial dinner and notes them to be high: up to 260. He feels well.  He denies exertional CP or SOB or symptomatic hypoglycemia. He denies foot sores or paresthesias.  Wt Readings from Last 3 Encounters:  03/30/19 188 lb (85.3 kg)  03/14/19 188 lb (85.3 kg)  02/06/19 188 lb (85.3 kg)    BP Readings from Last 3 Encounters:  03/30/19 132/64  03/14/19 (!) 154/68  02/06/19 122/68    Assessment  1. Diabetes mellitus type 2, insulin dependent ([place])      Plan   Diabetes is currently adequately controlled. Educated him on elevated postprandials. Will increase long acting insulin dose slightly and monitor. Recheck as scheduled.   Follow up: as scheduled. No orders of the defined types were placed in this encounter.  No orders of the defined types were placed in this encounter.     Immunization History  Administered Date(s) Administered  . Fluad Quad(high Dose 65+) 11/02/2018  . Influenza Split 11/14/2007, 01/01/2009, 12/23/2009  . Influenza, High Dose Seasonal PF 11/02/2012, 12/12/2013, 11/15/2014, 11/24/2017  . Influenza, Seasonal, Injecte, Preservative Fre 11/18/2015  . Influenza,trivalent, recombinat, inj, PF 12/07/2010  . Influenza-Unspecified 10/28/2011, 11/30/2014, 02/15/2017  . PFIZER SARS-COV-2 Vaccination 03/23/2019  . Pneumococcal Conjugate-13 12/13/2013  . Pneumococcal-Unspecified 08/20/2008  . Td 05/29/2002  . Tdap 10/28/2011, 02/15/2017  . Zoster 08/20/2008    . Zoster Recombinat (Shingrix) 10/13/2016, 12/13/2016    Diabetes Related Lab Review: Lab Results  Component Value Date   HGBA1C 8.5 (A) 02/06/2019   HGBA1C 7.7 (A) 10/04/2018   HGBA1C 8.8 (A) 08/03/2018    Lab Results  Component Value Date   MICROALBUR <0.7 03/14/2019   Lab Results  Component Value Date   CREATININE 1.02 03/14/2019   BUN 16 03/14/2019   NA 143 03/14/2019   K 4.3 03/14/2019   CL 103 03/14/2019   CO2 33 (H) 03/14/2019   Lab Results  Component Value Date   CHOL 143 10/04/2018   CHOL 146 12/07/2017   CHOL 159 07/20/2017   Lab Results  Component Value Date   HDL 44.60 10/04/2018   HDL 44.70 12/07/2017   HDL 42 07/20/2017   Lab Results  Component Value Date   LDLCALC 77 10/04/2018   LDLCALC 85 12/07/2017   LDLCALC 89 07/20/2017   Lab Results  Component Value Date   TRIG 106.0 10/04/2018   TRIG 78.0 12/07/2017   TRIG 139 07/20/2017   Lab Results  Component Value Date   CHOLHDL 3 10/04/2018   CHOLHDL 3 12/07/2017   CHOLHDL 4.0 06/07/2016   No results found for: LDLDIRECT The ASCVD Risk score Mikey Bussing DC Jr., et al., 2013) failed to calculate for the following reasons:   The 2013 ASCVD risk score is only valid for ages 32 to 71   The patient has a prior MI or stroke diagnosis I have reviewed the PMH, Fam and Soc history. Patient Active Problem List   Diagnosis  Date Noted  . Spinal stenosis of lumbar region 02/21/2018    Priority: High  . Diabetic peripheral neuropathy associated with type 2 diabetes mellitus ([place]) 05/05/2017    Priority: High    No pain   . Atherosclerotic heart disease of native coronary artery without angina pectoris 06/13/2016    Priority: High    Overview:  Overview:  RCA DES placed Nov 2016, LIMA-LAD presumed patent based on Myoview but no visualized, SVG-Dx occluded  Last Assessment & Plan:  RCA DES placed Nov 2016, LIMA-LAD presumed patent based on Myoview but no visualized, SVG-Dx occluded   . CVA (cerebral  vascular accident) ([place]) 06/06/2016    Priority: High  . Recurrent coronary arteriosclerosis after percutaneous transluminal coronary angioplasty 04/18/2015    Priority: High    Overview:  Overview:  RCA DES placed Nov 2016, LIMA-LAD presumed patent based on Myoview but no visualized, SVG-Dx occluded  Last Assessment & Plan:  RCA DES placed Nov 2016, LIMA-LAD presumed patent based on Myoview but no visualized, SVG-Dx occluded   . CAD S/P PCI- Nov 2016     Priority: High    RCA DES placed Nov 2016, LIMA-LAD presumed patent based on Myoview but no visualized, SVG-Dx occluded   . Current use of long term anticoagulation 02/06/2013    Priority: High    Overview:  Monitored by cardiology   . Chronic atrial fibrillation ([place]) 07/30/2011    Priority: High    CHADs VASc=5 for age, HTN, vascular disease, and DM   . Hx of CABG x 2 1990     Priority: High    Status post CABG x2 in 1990 including an LIMA graft to the LAD, and a vein graft to the diagonal.    . Essential (primary) hypertension     Priority: High  . Diabetes mellitus type 2, insulin dependent ([place])     Priority: High  . Hyperlipemia     Priority: High  . Degeneration of lumbar intervertebral disc 02/21/2018    Priority: Medium  . Chronic vertigo 03/15/2014    Priority: Medium  . Obesity (BMI 30.0-34.9) 01/31/2012    Priority: Medium  . Enlarged prostate without lower urinary tract symptoms (luts) 01/04/2011    Priority: Medium    Overview:  Urology - avodart and tamuloscin   . Gastro-esophageal reflux disease without esophagitis 12/07/2010    Priority: Medium  . Epistaxis, recurrent 04/11/2015    Priority: Low  . Osteoarthritis, knee 01/31/2012    Priority: Low  . Allergic rhinitis 10/28/2011    Priority: Low  . Hearing loss 06/08/2011    Priority: Low  . Primary osteoarthritis of hand 06/08/2011    Priority: Low  . Hematuria 08/27/2008    Priority: Low    Overview:  Essential Hematuria - urology -  Grapey  w/u benign  10/1 IMO update Overview:  Overview:  Essential Hematuria - urology - Grapey  w/u benign   . Chronic diastolic CHF (congestive heart failure) ([place]) 03/14/2019  . Lower extremity edema 03/14/2019  . Hip pain 01/09/2019  . History of revision of total replacement of right hip joint 01/03/2018  . Leukocytosis 06/02/2016  . Carotid stenosis 06/02/2016    Social History: Patient  reports that he quit smoking about 60 years ago. His smoking use included cigarettes. He has a 30.00 pack-year smoking history. He has never used smokeless tobacco. He reports that he does not drink alcohol or use drugs.  Review of Systems: Ophthalmic: negative for eye pain, loss  of vision or double vision Cardiovascular: negative for chest pain Respiratory: negative for SOB or persistent cough Gastrointestinal: negative for abdominal pain Genitourinary: negative for dysuria or gross hematuria MSK: negative for foot lesions Neurologic: negative for weakness or gait disturbance  Objective  Vitals: BP 132/64 (BP Location: Left Arm, Patient Position: Sitting, Cuff Size: Normal)   Pulse 74   Temp 98.1 F (36.7 C) (Temporal)   Ht 5\' 6"  (1.676 m)   Wt 188 lb (85.3 kg)   SpO2 98%   BMI 30.34 kg/m  General: well appearing, no acute distress  Psych:  Alert and oriented, normal mood and affect    Diabetic education: ongoing education regarding chronic disease management for diabetes was given today. We continue to reinforce the ABC's of diabetic management: A1c (<7 or 8 dependent upon patient), tight blood pressure control, and cholesterol management with goal LDL < 100 minimally. We discuss diet strategies, exercise recommendations, medication options and possible side effects. At each visit, we review recommended immunizations and preventive care recommendations for diabetics and stress that good diabetic control can prevent other problems. See below for this patient's  data.    Commons side effects, risks, benefits, and alternatives for medications and treatment plan prescribed today were discussed, and the patient expressed understanding of the given instructions. Patient is instructed to call or message via MyChart if he/she has any questions or concerns regarding our treatment plan. No barriers to understanding were identified. We discussed Red Flag symptoms and signs in detail. Patient expressed understanding regarding what to do in case of urgent or emergency type symptoms.   Medication list was reconciled, printed and provided to the patient in AVS. Patient instructions and summary information was reviewed with the patient as documented in the AVS. This note was prepared with assistance of Dragon voice recognition software. Occasional wrong-word or sound-a-like substitutions may have occurred due to the inherent limitations of voice recognition software  This visit occurred during the SARS-CoV-2 public health emergency.  Safety protocols were in place, including screening questions prior to the visit, additional usage of staff PPE, and extensive cleaning of exam room while observing appropriate contact time as indicated for disinfecting solutions.

## 2019-04-04 ENCOUNTER — Encounter: Payer: Self-pay | Admitting: Podiatry

## 2019-04-04 ENCOUNTER — Ambulatory Visit (INDEPENDENT_AMBULATORY_CARE_PROVIDER_SITE_OTHER): Payer: Medicare Other | Admitting: Podiatry

## 2019-04-04 ENCOUNTER — Other Ambulatory Visit: Payer: Self-pay

## 2019-04-04 DIAGNOSIS — M79674 Pain in right toe(s): Secondary | ICD-10-CM

## 2019-04-04 DIAGNOSIS — E119 Type 2 diabetes mellitus without complications: Secondary | ICD-10-CM | POA: Diagnosis not present

## 2019-04-04 DIAGNOSIS — D689 Coagulation defect, unspecified: Secondary | ICD-10-CM | POA: Diagnosis not present

## 2019-04-04 DIAGNOSIS — B351 Tinea unguium: Secondary | ICD-10-CM | POA: Diagnosis not present

## 2019-04-04 NOTE — Progress Notes (Signed)
Patient ID: [name], male   DOB: 04-29-1928, 86 y.o.   MRN: [id number] Complaint:  Visit Type: Patient returns to my office for continued preventative foot care services. Complaint: Patient states" my nails have grown long and thick and become painful to walk and wear shoes" Patient has been diagnosed with DM with no foot  complications. He presents for preventative foot care services. No changes to ROS.  Patient is on eliquiss.  Podiatric Exam: Vascular: dorsalis pedis and posterior tibial pulses are palpable bilateral. Capillary return is immediate. Temperature gradient is WNL. Skin turgor WNL  Sensorium: Normal Semmes Weinstein monofilament test. Normal tactile sensation bilaterally. Nail Exam: Pt has thick disfigured discolored nails with subungual debris noted bilateral entire nail hallux through fifth toenails Ulcer Exam: There is no evidence of ulcer or pre-ulcerative changes or infection. Orthopedic Exam: Muscle tone and strength are WNL. No limitations in general ROM. No crepitus or effusions noted. Foot type and digits show no abnormalities. Bony prominences are unremarkable. Skin: No Porokeratosis. No infection or ulcers  Diagnosis:  Tinea unguium, Pain in right toe, pain in left toes  Treatment & Plan Procedures and Treatment: Consent by patient was obtained for treatment procedures. The patient understood the discussion of treatment and procedures well. All questions were answered thoroughly reviewed. Debridement of mycotic and hypertrophic toenails, 1 through 5 bilateral and clearing of subungual debris. No ulceration, no infection noted.  Return Visit-Office Procedure: Patient instructed to return to the office for a follow up visit 10 weeks  for continued evaluation and treatment.    Gardiner Barefoot DPM

## 2019-04-14 ENCOUNTER — Ambulatory Visit: Payer: Medicare Other | Attending: Internal Medicine

## 2019-04-14 DIAGNOSIS — Z23 Encounter for immunization: Secondary | ICD-10-CM | POA: Insufficient documentation

## 2019-04-14 NOTE — Progress Notes (Signed)
   Covid-19 Vaccination Clinic  Name:  [name]    MRN: [id number] DOB: 07-Jan-1929  04/14/2019  [name] was observed post Covid-19 immunization for 15 minutes without incidence. He was provided with Vaccine Information Sheet and instruction to access the V-Safe system.   [name] was instructed to call 911 with any severe reactions post vaccine: [name] Difficulty breathing  . Swelling of your face and throat  . A fast heartbeat  . A bad rash all over your body  . Dizziness and weakness    Immunizations Administered    Name Date Dose VIS Date Route   Pfizer COVID-19 Vaccine 04/14/2019 10:36 AM 0.3 mL 02/09/2019 Intramuscular   Manufacturer: [employer], [employer]   Lot: [id number]   [place]: [id number]

## 2019-04-23 ENCOUNTER — Telehealth: Payer: Self-pay

## 2019-04-23 ENCOUNTER — Other Ambulatory Visit: Payer: Self-pay

## 2019-04-23 ENCOUNTER — Telehealth (INDEPENDENT_AMBULATORY_CARE_PROVIDER_SITE_OTHER): Payer: Medicare Other | Admitting: Family Medicine

## 2019-04-23 DIAGNOSIS — J029 Acute pharyngitis, unspecified: Secondary | ICD-10-CM | POA: Diagnosis not present

## 2019-04-23 NOTE — Telephone Encounter (Signed)
Patient called in c/o sore throat that started yesterday. Progressively has gotten worse. Throat is red, but no swelling. Has tried OTC meds for sore throat with no relief. Unable to eat due to pain. Denies any fever or chills. Informed patient that I would route message to PCP.

## 2019-04-23 NOTE — Progress Notes (Signed)
TELEPHONE ENCOUNTER   Patient verbally agreed to telephone visit and is aware that copayment and coinsurance may apply. Patient was treated using telemedicine according to accepted telemedicine protocols.  Location of the patient: home Location of provider: [facility], [place] of all persons participating in the telemedicine service and role in the encounter: [name], MD Reymundo Poll, CMA   Subjective  CC:  Chief Complaint  Patient presents with  . Sore Throat    started yesterday, no OTC meds helping, denies fever.     HPI: [name] is a 86 y.o. male who was telephoned today to address the problems listed above in the chief complaint.  86 yo w/ 2 days of sore throat. Worse yesterday. Today a little better. Kept him awake last night. Able to eat and drink ok. Otherwise feels fine. Doesn't think covid related. Had 1st and 2nd covid vaccination. No cough, sob,fever, n/v/. Using chloraseptic throat spray w/o any relief. Admits to intermittent sneezing and wife reports he stays congested from allergies.   Immunization History  Administered Date(s) Administered  . Fluad Quad(high Dose 65+) 11/02/2018  . Influenza Split 11/14/2007, 01/01/2009, 12/23/2009  . Influenza, High Dose Seasonal PF 11/02/2012, 12/12/2013, 11/15/2014, 11/24/2017  . Influenza, Seasonal, Injecte, Preservative Fre 11/18/2015  . Influenza,trivalent, recombinat, inj, PF 12/07/2010  . Influenza-Unspecified 10/28/2011, 11/30/2014, 02/15/2017  . PFIZER SARS-COV-2 Vaccination 03/23/2019, 04/14/2019  . Pneumococcal Conjugate-13 12/13/2013  . Pneumococcal-Unspecified 08/20/2008  . Td 05/29/2002  . Tdap 10/28/2011, 02/15/2017  . Zoster 08/20/2008  . Zoster Recombinat (Shingrix) 10/13/2016, 12/13/2016     ASSESSMENT: 1. Acute pharyngitis, unspecified etiology      pharyngitis:  ? Viral vs irritant due to PND from allergies. Start advil prn, aspirin gargles and recheck 48 hours.  Home isolation recommended in the interim. Monitor sugars. Trial of allegra or zyrtec for allergy sxs.   Time spent with the patient (non face-to-face time during this virtual encounter): 10 minutes, spent in obtaining information about his symptoms, reviewing his previous labs, evaluations, and treatments, counseling him about his condition (please see the discussed topics above), and developing a plan to further investigate it; the patient was provided an opportunity to ask questions and all were answered. The patient agreed with the plan and demonstrated an understanding of the instructions.   The patient was advised to call back or seek an in-person evaluation if the symptoms worsen or if the condition fails to improve as anticipated.  Follow up: No follow-ups on file.  05/22/2019  No orders of the defined types were placed in this encounter.  No orders of the defined types were placed in this encounter.    I reviewed the patients updated PMH, FH, and SocHx.    Patient Active Problem List   Diagnosis Date Noted  . Spinal stenosis of lumbar region 02/21/2018    Priority: High  . Diabetic peripheral neuropathy associated with type 2 diabetes mellitus ([place]) 05/05/2017    Priority: High  . Atherosclerotic heart disease of native coronary artery without angina pectoris 06/13/2016    Priority: High  . CVA (cerebral vascular accident) ([place]) 06/06/2016    Priority: High  . Recurrent coronary arteriosclerosis after percutaneous transluminal coronary angioplasty 04/18/2015    Priority: High  . CAD S/P PCI- Nov 2016     Priority: High  . Current use of long term anticoagulation 02/06/2013    Priority: High  . Chronic atrial fibrillation ([place]) 07/30/2011    Priority: High  .  Hx of CABG x 2 1990     Priority: High  . Essential (primary) hypertension     Priority: High  . Diabetes mellitus type 2, insulin dependent ([place])     Priority: High  . Hyperlipemia     Priority: High  .  Degeneration of lumbar intervertebral disc 02/21/2018    Priority: Medium  . Chronic vertigo 03/15/2014    Priority: Medium  . Obesity (BMI 30.0-34.9) 01/31/2012    Priority: Medium  . Enlarged prostate without lower urinary tract symptoms (luts) 01/04/2011    Priority: Medium  . Gastro-esophageal reflux disease without esophagitis 12/07/2010    Priority: Medium  . Epistaxis, recurrent 04/11/2015    Priority: Low  . Osteoarthritis, knee 01/31/2012    Priority: Low  . Allergic rhinitis 10/28/2011    Priority: Low  . Hearing loss 06/08/2011    Priority: Low  . Primary osteoarthritis of hand 06/08/2011    Priority: Low  . Hematuria 08/27/2008    Priority: Low  . Chronic diastolic CHF (congestive heart failure) ([place]) 03/14/2019  . Lower extremity edema 03/14/2019  . Hip pain 01/09/2019  . History of revision of total replacement of right hip joint 01/03/2018  . Leukocytosis 06/02/2016  . Carotid stenosis 06/02/2016   Current Meds  Medication Sig  . amoxicillin (AMOXIL) 500 MG capsule Take 4 caps by mouth 1 hours prior to dental procedure  . apixaban (ELIQUIS) 5 MG TABS tablet Take 1 tablet (5 mg total) by mouth 2 (two) times daily.  [name] atorvastatin (LIPITOR) 40 MG tablet Take 1 tablet (40 mg total) by mouth daily at 6 PM.  . AVODART 0.5 MG capsule Take 0.5 mg by mouth every other day.   . diltiazem (CARDIZEM CD) 360 MG 24 hr capsule Take 1 capsule (360 mg total) by mouth daily.  . empagliflozin (JARDIANCE) 10 MG TABS tablet Take 10 mg by mouth daily.  . furosemide (LASIX) 40 MG tablet Take 40 mg by mouth.  . gabapentin (NEURONTIN) 300 MG capsule Take 1 capsule (300 mg total) by mouth 3 (three) times daily.  . insulin glargine (LANTUS) 100 UNIT/ML injection Inject 40 Units into the skin daily.  . metFORMIN (GLUCOPHAGE) 500 MG tablet Take 500 mg by mouth 2 (two) times daily with a meal.  . metoprolol succinate (TOPROL-XL) 100 MG 24 hr tablet TAKE 1 TABLET BY MOUTH TWICE DAILY  .  nitroGLYCERIN (NITROSTAT) 0.4 MG SL tablet Place 0.4 mg under the tongue every 5 (five) minutes as needed for chest pain (x 3 doses).  [name] oxyCODONE-acetaminophen (PERCOCET) 10-325 MG tablet Take 1 tablet by mouth 2 (two) times daily as needed for pain.  . pantoprazole (PROTONIX) 40 MG tablet TAKE 1 TABLET BY MOUTH DAILY AT NOON  . potassium chloride (KLOR-CON) 20 MEQ packet Take by mouth 2 (two) times daily.  [name] PRECISION XTRA TEST STRIPS test strip TEST BLOOD SUGAR 1-2 TIMES DAILY  . Tamsulosin HCl (FLOMAX) 0.4 MG CAPS Take 0.4 mg by mouth every other day.     Allergies: Patient is allergic to erythromycin base; cefadroxil; ciprofloxacin; and erythromycin. Family History: Patient family history includes Brain cancer in his father; Throat cancer in his brother. Social History:  Patient  reports that he quit smoking about 60 years ago. His smoking use included cigarettes. He has a 30.00 pack-year smoking history. He has never used smokeless tobacco. He reports that he does not drink alcohol or use drugs.  Review of Systems: Constitutional: Negative for fever malaise or  anorexia Cardiovascular: negative for chest pain Respiratory: negative for SOB or persistent cough Gastrointestinal: negative for abdominal pain   Objective: hoarse and sounds a little congestion.

## 2019-04-25 ENCOUNTER — Encounter: Payer: Self-pay | Admitting: Family Medicine

## 2019-04-25 ENCOUNTER — Ambulatory Visit (INDEPENDENT_AMBULATORY_CARE_PROVIDER_SITE_OTHER): Payer: Medicare Other | Admitting: Family Medicine

## 2019-04-25 VITALS — BP 118/64 | HR 56 | Temp 98.2°F | Ht 66.0 in | Wt 188.0 lb

## 2019-04-25 DIAGNOSIS — J029 Acute pharyngitis, unspecified: Secondary | ICD-10-CM

## 2019-04-25 DIAGNOSIS — J301 Allergic rhinitis due to pollen: Secondary | ICD-10-CM

## 2019-04-25 MED ORDER — FLUTICASONE PROPIONATE 50 MCG/ACT NA SUSP: 1.0000 | Freq: Every day | NASAL | 6 refills | Status: DC

## 2019-04-25 NOTE — Progress Notes (Signed)
Subjective  CC:  Chief Complaint  Patient presents with  . Sore Throat    better a little bit. Aspirin 81 and Ibuprofen seems to help some    HPI: [name] is a 86 y.o. male who presents to the office today to address the problems listed above in the chief complaint.  Short term f/u for ST> doing better. Takes advil at night. No longer with severe pain. Eating and drinking well. occ sneezing and notes clearing throat. No malaise. No sob. Sugars are stabke  Assessment  1. Seasonal allergic rhinitis due to pollen   2. Sore throat      Plan   SAR and ST:  Start flonase and claritin. Should improve. No red flags.   Follow up: No follow-ups on file.  05/22/2019  No orders of the defined types were placed in this encounter.  Meds ordered this encounter  Medications  . fluticasone (FLONASE) 50 MCG/ACT nasal spray    Sig: Place 1 spray into both nostrils daily.    Dispense:  16 g    Refill:  6      I reviewed the patients updated PMH, FH, and SocHx.    Patient Active Problem List   Diagnosis Date Noted  . Spinal stenosis of lumbar region 02/21/2018    Priority: High  . Diabetic peripheral neuropathy associated with type 2 diabetes mellitus ([place]) 05/05/2017    Priority: High  . Atherosclerotic heart disease of native coronary artery without angina pectoris 06/13/2016    Priority: High  . CVA (cerebral vascular accident) ([place]) 06/06/2016    Priority: High  . Recurrent coronary arteriosclerosis after percutaneous transluminal coronary angioplasty 04/18/2015    Priority: High  . CAD S/P PCI- Nov 2016     Priority: High  . Current use of long term anticoagulation 02/06/2013    Priority: High  . Chronic atrial fibrillation ([place]) 07/30/2011    Priority: High  . Hx of CABG x 2 1990     Priority: High  . Essential (primary) hypertension     Priority: High  . Diabetes mellitus type 2, insulin dependent ([place])     Priority: High  . Hyperlipemia     Priority: High    . Degeneration of lumbar intervertebral disc 02/21/2018    Priority: Medium  . Chronic vertigo 03/15/2014    Priority: Medium  . Obesity (BMI 30.0-34.9) 01/31/2012    Priority: Medium  . Enlarged prostate without lower urinary tract symptoms (luts) 01/04/2011    Priority: Medium  . Gastro-esophageal reflux disease without esophagitis 12/07/2010    Priority: Medium  . Epistaxis, recurrent 04/11/2015    Priority: Low  . Osteoarthritis, knee 01/31/2012    Priority: Low  . Allergic rhinitis 10/28/2011    Priority: Low  . Hearing loss 06/08/2011    Priority: Low  . Primary osteoarthritis of hand 06/08/2011    Priority: Low  . Hematuria 08/27/2008    Priority: Low  . Chronic diastolic CHF (congestive heart failure) ([place]) 03/14/2019  . Lower extremity edema 03/14/2019  . Hip pain 01/09/2019  . History of revision of total replacement of right hip joint 01/03/2018  . Leukocytosis 06/02/2016  . Carotid stenosis 06/02/2016   Current Meds  Medication Sig  . amoxicillin (AMOXIL) 500 MG capsule Take 4 caps by mouth 1 hours prior to dental procedure  . apixaban (ELIQUIS) 5 MG TABS tablet Take 1 tablet (5 mg total) by mouth 2 (two) times daily.  [name] atorvastatin (LIPITOR) 40 MG  tablet Take 1 tablet (40 mg total) by mouth daily at 6 PM.  . AVODART 0.5 MG capsule Take 0.5 mg by mouth every other day.   . diltiazem (CARDIZEM CD) 360 MG 24 hr capsule Take 1 capsule (360 mg total) by mouth daily.  . empagliflozin (JARDIANCE) 10 MG TABS tablet Take 10 mg by mouth daily.  . furosemide (LASIX) 40 MG tablet Take 40 mg by mouth.  . gabapentin (NEURONTIN) 300 MG capsule Take 1 capsule (300 mg total) by mouth 3 (three) times daily.  . insulin glargine (LANTUS) 100 UNIT/ML injection Inject 40 Units into the skin daily.  . metFORMIN (GLUCOPHAGE) 500 MG tablet Take 500 mg by mouth 2 (two) times daily with a meal.  . metoprolol succinate (TOPROL-XL) 100 MG 24 hr tablet TAKE 1 TABLET BY MOUTH TWICE DAILY   . nitroGLYCERIN (NITROSTAT) 0.4 MG SL tablet Place 0.4 mg under the tongue every 5 (five) minutes as needed for chest pain (x 3 doses).  [name] oxyCODONE-acetaminophen (PERCOCET) 10-325 MG tablet Take 1 tablet by mouth 2 (two) times daily as needed for pain.  . pantoprazole (PROTONIX) 40 MG tablet TAKE 1 TABLET BY MOUTH DAILY AT NOON  . potassium chloride (KLOR-CON) 20 MEQ packet Take by mouth 2 (two) times daily.  [name] PRECISION XTRA TEST STRIPS test strip TEST BLOOD SUGAR 1-2 TIMES DAILY  . Tamsulosin HCl (FLOMAX) 0.4 MG CAPS Take 0.4 mg by mouth every other day.     Allergies: Patient is allergic to erythromycin base; cefadroxil; ciprofloxacin; and erythromycin. Family History: Patient family history includes Brain cancer in his father; Throat cancer in his brother. Social History:  Patient  reports that he quit smoking about 60 years ago. His smoking use included cigarettes. He has a 30.00 pack-year smoking history. He has never used smokeless tobacco. He reports that he does not drink alcohol or use drugs.  Review of Systems: Constitutional: Negative for fever malaise or anorexia Cardiovascular: negative for chest pain Respiratory: negative for SOB or persistent cough Gastrointestinal: negative for abdominal pain  Objective  Vitals: BP 118/64 (BP Location: Left Arm, Patient Position: Sitting, Cuff Size: Normal)   Pulse (!) 56   Temp 98.2 F (36.8 C) (Temporal)   Ht 5\' 6"  (1.676 m)   Wt 188 lb (85.3 kg)   SpO2 92%   BMI 30.34 kg/m  General: no acute distress , A&Ox3 HEENT: PND audible,  Oropharynx moist, post pharynx w/o erythema, exudate nor edema, no lad,neck is supple Respiratory:  Good breath sounds bilaterally, CTAB with normal respiratory effort     Commons side effects, risks, benefits, and alternatives for medications and treatment plan prescribed today were discussed, and the patient expressed understanding of the given instructions. Patient is instructed to call or  message via MyChart if he/she has any questions or concerns regarding our treatment plan. No barriers to understanding were identified. We discussed Red Flag symptoms and signs in detail. Patient expressed understanding regarding what to do in case of urgent or emergency type symptoms.   Medication list was reconciled, printed and provided to the patient in AVS. Patient instructions and summary information was reviewed with the patient as documented in the AVS. This note was prepared with assistance of Dragon voice recognition software. Occasional wrong-word or sound-a-like substitutions may have occurred due to the inherent limitations of voice recognition software  This visit occurred during the SARS-CoV-2 public health emergency.  Safety protocols were in place, including screening questions prior to the visit, additional  usage of staff PPE, and extensive cleaning of exam room while observing appropriate contact time as indicated for disinfecting solutions.   

## 2019-04-25 NOTE — Patient Instructions (Signed)
Please follow up as scheduled for your next visit with me: 05/22/2019   If you have any questions or concerns, please don't hesitate to send me a message via MyChart or call the office at [phone]. Thank you for visiting with [place] today! It's our pleasure caring for you.  Your sore throat is from the drainage caused by your allergies or a mild cold.  Start the nasal spray, flonase daily.  You mayalso start loratadine (otc claritin, generic) daily to help.

## 2019-04-30 ENCOUNTER — Telehealth: Payer: Self-pay | Admitting: Family Medicine

## 2019-04-30 DIAGNOSIS — J189 Pneumonia, unspecified organism: Secondary | ICD-10-CM

## 2019-04-30 HISTORY — DX: Pneumonia, unspecified organism: J18.9

## 2019-04-30 NOTE — Telephone Encounter (Signed)
Called patient let him know faxed results. Patient will call and if not received he will just go up to office tomorrow to have labs checked so that he can get refill.

## 2019-04-30 NOTE — Telephone Encounter (Signed)
Patient states he was seen 03/14/19 by Dr. Jonni Sanger.  States that his kidney function results were to have been faxed over to the [place] in order to keep his refills going on his metformin.  [facility] and [facility] are not [facility] his metformin.  Needs kidney function results faxed over to the [place], Attn:  Dr. Kerby Moors at [phone] as soon as possible.  Please follow up with patient once completed.

## 2019-05-10 ENCOUNTER — Other Ambulatory Visit: Payer: Self-pay | Admitting: Family Medicine

## 2019-05-11 ENCOUNTER — Telehealth: Payer: Self-pay

## 2019-05-11 NOTE — Telephone Encounter (Signed)
See below

## 2019-05-11 NOTE — Telephone Encounter (Signed)
[facility] [place] RECORD [facility]urse Patient Name: [name] Gender: Male DOB: 08/21/1928 Age: 86 Y 11 M 16 D Return Phone Number: [phone] (Primary) Address: City/State/Zip: [place] [address] [facility] [place] [facility] [place] [facility] [place] [facility] [place] Contact Type Call Who Is Calling Patient / Member / Family / Caregiver Call Type Triage / Clinical Relationship To Patient Self Return Phone Number [phone] (Primary) Chief Complaint Cough Reason for Call Symptomatic / Request for [place] states the patient has a cold for over a week and going into chest. Dr. Jonni Sanger Additional Comment No SOB. Runny nose and coughing a lot. He was told by doctor to take Ibuprofen. Translation No Nurse Assessment Nurse: Gildardo Pounds, RN, Amy Date/Time (Eastern Time): 05/10/2019 10:57:23 AM Confirm and document reason for call. If symptomatic, describe symptoms. ---Caller states he has had a cold for over a week, and it is going into his chest. No SOB. He has a runny nose and is coughing a lot. He saw the doctor Monday. He is not coughing anything up. No fever. Has the patient had close contact with a person known or suspected to have the novel coronavirus illness OR traveled / lives in area with major community spread (including international travel) in the last 14 days from the onset of symptoms? * If Asymptomatic, screen for exposure and travel within the last 14 days. ---No Does the patient have any new or worsening symptoms? ---Yes Will a triage be completed? ---Yes Related visit to physician within the last 2 weeks? ---Yes Does the PT have any chronic conditions? (i.e. diabetes, asthma, this includes High risk factors for pregnancy, etc.) ---Yes List chronic conditions. ---DM, HTN Is this a behavioral health or substance abuse call?  ---No Guidelines Guideline Title Affirmed Question Affirmed Notes Nurse Date/Time (Eastern Time) Cough - Acute NonProductive Cough with cold symptoms (e.g., runny nose, postnasal drip, throat clearing) Lovelace, RN, Amy 05/10/2019 11:00:24 AMPLEASE NOTE: All timestamps contained within this report are represented as [place] Standard Time. CONFIDENTIALTY NOTICE: This fax transmission is intended only for the addressee. It contains information that is legally privileged, confidential or otherwise protected from use or disclosure. If you are not the intended recipient, you are strictly prohibited from reviewing, disclosing, copying using or disseminating any of this information or taking any action in reliance on or regarding this information. If you have received this fax in error, please notify us immediately by telephone so that we can arrange for its return to [place]. Phone: [phone], Toll-Free: [phone], Fax: [phone] Page: 2 of 2 Call Id: [id number] [place]. Time Eilene Ghazi Time) Disposition Final User 05/10/2019 11:04:13 AM Home Care Yes Lovelace, RN, Amy Caller Disagree/Comply Comply Caller Understands Yes PreDisposition InappropriateToAsk Care Advice Given Per Guideline HOME CARE: * You should be able to treat this at home. FOR A RUNNY NOSE - BLOW YOUR NOSE: * Nasal mucus and discharge help wash viruses and bacteria out of the nose and sinuses. * Blowing your nose helps clean out your nose. Use a handkerchief or a paper tissue. * If the skin around your nostrils gets irritated, apply a tiny amount of petroleum ointment to the nasal openings once or twice a day. NASAL WASHES FOR A STUFFY NOSE: * Introduction: Saline (salt water) nasal irrigation (nasal wash) is an effective and simple home remedy for treating stuffy nose and sinus congestion. The nose can be irrigated by pouring,  spraying, or squirting salt water into the nose and then letting it run back out. * Methods: There are  several ways to irrigate the nose. You can use a saline nasal spray bottle (available over-the-counter), a rubber ear syringe, a medical syringe without the needle, or a NETI POT. * How it Helps: The salt water rinses out excess mucus and washes out any irritants (dust, allergens) that might be present. It also moistens the nasal cavity. CALL BACK IF: * Nasal discharge lasts over 10 days * Fever over 104 F (40 C), or fever lasts over 3 days * You become worse. CARE ADVICE given per Cough - Acute Non-Productive (Adult) guideline.

## 2019-05-11 NOTE — Telephone Encounter (Signed)
Patient called this morning pt states that his cold is still not clearing up. Offered patient virtual appt and he declined explain to the patient that the next option would be urgent care or ER. Patient stated that he will go an urgent care.

## 2019-05-12 ENCOUNTER — Ambulatory Visit (INDEPENDENT_AMBULATORY_CARE_PROVIDER_SITE_OTHER)
Admission: EM | Admit: 2019-05-12 | Discharge: 2019-05-12 | Disposition: A | Discharge status: Home / Self Care | Payer: Medicare Other | Source: Home / Self Care

## 2019-05-12 ENCOUNTER — Encounter (HOSPITAL_COMMUNITY): Payer: Self-pay | Admitting: Family Medicine

## 2019-05-12 ENCOUNTER — Other Ambulatory Visit: Payer: Self-pay

## 2019-05-12 DIAGNOSIS — J069 Acute upper respiratory infection, unspecified: Secondary | ICD-10-CM

## 2019-05-12 MED ORDER — AZELASTINE HCL 0.1 % NA SOLN: 1.0000 | Freq: Two times a day (BID) | NASAL | 12 refills | Status: DC

## 2019-05-12 MED ORDER — BENZONATATE 100 MG PO CAPS: 100.0000 mg | ORAL_CAPSULE | Freq: Two times a day (BID) | ORAL | 0 refills | Status: DC | PRN

## 2019-05-12 NOTE — ED Triage Notes (Signed)
Per patient seen by his PCP x 1 week ago for the same symptoms: coughing sinus drainage, mucous. Patient stated on Friday symptoms reoccur and not able to sleep because of the coughing.

## 2019-05-12 NOTE — ED Provider Notes (Signed)
[name]    CSN: [id number] Arrival date & time: 05/12/19  1009      History   Chief Complaint Chief Complaint  Patient presents with  . Cough  . Sinusitis    HPI [name] is a 86 y.o. male.   Initial [facility] visit for this 86 yo man complaining of runny nose.  From the [place] controlled substance website, it appears that this gentleman is on chronic opioids.  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  Patient has had rhinorrhea, mild sore throat on the left, cough with some chest pain when he does, but no fever or shortness of breath.  He has had both vaccines for Covid as well as a flu shot.  He is called his doctor who said he should go to the urgent care for symptoms.     Past Medical History:  Diagnosis Date  . Abdominal pain   . Acute renal failure ([place])     resolved  . Arthritis    "hands & legs" (11/'10/2014)  . Ataxia   . Atrial fibrillation ([place])       .    [name] BPH (benign prostatic hyperplasia)   . CAD (coronary artery disease)    a. CABG IN 1989. b. 01/08/2015 CTO of ost LAD, LIMA to LAD not visualized but assumed patent given myoview finding, occluded SVG to diagonal, 99% mid RCA tx w/ SYNERGY DES 3X28 mm      . Diabetes mellitus type 2, insulin dependent [facility])     Feb 2017  . GERD (gastroesophageal reflux disease)   . HTN (hypertension)   .    [name] Hyperlipemia   . Kidney stones       . Rib fractures    left rib fractures being treated with pain medications    Nov 2016     .         Patient Active Problem List   Diagnosis Date Noted  . Chronic diastolic CHF (congestive heart failure) ([place]) 03/14/2019  . Lower extremity edema 03/14/2019  . Hip pain 01/09/2019  . Spinal stenosis of lumbar region 02/21/2018  . Degeneration of lumbar intervertebral disc 02/21/2018  . History of revision of total replacement of right hip joint 01/03/2018  . Diabetic peripheral neuropathy associated with type 2 diabetes mellitus ([place])  05/05/2017  . Atherosclerotic heart disease of native coronary artery without angina pectoris 06/13/2016  . CVA (cerebral vascular accident) ([place]) 06/06/2016  . Leukocytosis 06/02/2016  . Carotid stenosis 06/02/2016  . Recurrent coronary arteriosclerosis after percutaneous transluminal coronary angioplasty 04/18/2015  . Epistaxis, recurrent 04/11/2015  . CAD S/P PCI- Nov 2016   . Chronic vertigo 03/15/2014  . Current use of long term anticoagulation 02/06/2013  . Obesity (BMI 30.0-34.9) 01/31/2012  . Osteoarthritis, knee 01/31/2012  . Allergic rhinitis 10/28/2011  . Chronic atrial fibrillation ([place]) 07/30/2011  . Hearing loss 06/08/2011  . Primary osteoarthritis of hand 06/08/2011  . Enlarged prostate without lower urinary tract symptoms (luts) 01/04/2011  . Gastro-esophageal reflux disease without esophagitis 12/07/2010  . Hx of CABG x 2 1990   . Essential (primary) hypertension   . Diabetes mellitus type 2, insulin dependent ([place])   . Hyperlipemia   . Hematuria 08/27/2008    Past Surgical History:  Procedure Laterality Date  . CARDIAC CATHETERIZATION  1989  . CARDIAC CATHETERIZATION N/A 01/08/2015   Procedure: Left Heart Cath and Cors/Grafts Angiography;  Surgeon: Peter M Martinique, MD;  Location: [facility] INVASIVE CV  LAB;  Service: Cardiovascular;  Laterality: N/A;  . CARDIAC CATHETERIZATION  01/08/2015   Procedure: Coronary Stent Intervention;  Surgeon: Peter M Martinique, MD;  Location: [facility] CV LAB;  Service: Cardiovascular;;  . CARPAL TUNNEL RELEASE Right 08/2010  . CATARACT EXTRACTION W/ INTRAOCULAR LENS  IMPLANT, BILATERAL Bilateral   . CORONARY ANGIOPLASTY  01/08/15   RCA DES  . CORONARY ARTERY BYPASS GRAFT  1989   "CABG X 2"  . JOINT REPLACEMENT    . TONSILLECTOMY    . TOTAL HIP ARTHROPLASTY Right 2000       Home Medications    Prior to Admission medications   Medication Sig Start Date End Date Taking? Authorizing Provider  apixaban (ELIQUIS) 5 MG TABS tablet Take 1  tablet (5 mg total) by mouth 2 (two) times daily. 06/08/16  Yes Reyne Dumas, MD  atorvastatin (LIPITOR) 40 MG tablet Take 1 tablet (40 mg total) by mouth daily at 6 PM. 06/08/16  Yes Abrol, Ascencion Dike, MD  AVODART 0.5 MG capsule Take 0.5 mg by mouth every other day.  07/20/11  Yes [provider]  diltiazem (CARDIZEM CD) 360 MG 24 hr capsule Take 1 capsule (360 mg total) by mouth daily. 01/10/15  Yes Martinique, Peter M, MD  empagliflozin (JARDIANCE) 10 MG TABS tablet Take 10 mg by mouth daily.   Yes [provider]  fluticasone (FLONASE) 50 MCG/ACT nasal spray Place 1 spray into both nostrils daily. 04/25/19  Yes [name], MD  furosemide (LASIX) 40 MG tablet Take 40 mg by mouth.   Yes [provider]  gabapentin (NEURONTIN) 300 MG capsule Take 1 capsule (300 mg total) by mouth 3 (three) times daily. 11/08/18  Yes [name], MD  insulin glargine (LANTUS) 100 UNIT/ML injection Inject 40 Units into the skin daily.   Yes [provider]  metFORMIN (GLUCOPHAGE) 500 MG tablet Take 500 mg by mouth 2 (two) times daily with a meal.   Yes [provider]  metoprolol succinate (TOPROL-XL) 100 MG 24 hr tablet TAKE 1 TABLET BY MOUTH TWICE DAILY 06/19/18  Yes [name], MD  nitroGLYCERIN (NITROSTAT) 0.4 MG SL tablet Place 0.4 mg under the tongue every 5 (five) minutes as needed for chest pain (x 3 doses).   Yes [provider]  oxyCODONE-acetaminophen (PERCOCET) 10-325 MG tablet Take 1 tablet by mouth 2 (two) times daily as needed for pain. 02/19/19  Yes [name], MD  pantoprazole (PROTONIX) 40 MG tablet TAKE 1 TABLET BY MOUTH DAILY AT NOON 09/18/18  Yes Martinique, Peter M, MD  potassium chloride (KLOR-CON) 20 MEQ packet Take by mouth 2 (two) times daily.   Yes [provider]  potassium chloride SA (KLOR-CON) 20 MEQ tablet Take 20 mEq by mouth daily. 04/18/19  Yes [provider]  PRECISION XTRA TEST STRIPS test strip USE TO TEST  BLOOD SUGAR 1 TO 2 TIMES DAILY 05/10/19  Yes [name], MD  Tamsulosin HCl (FLOMAX) 0.4 MG CAPS Take 0.4 mg by mouth every other day.    Yes [provider]  azelastine (ASTELIN) 0.1 % nasal spray Place 1 spray into both nostrils 2 (two) times daily. Use in each nostril as directed 05/12/19   Robyn Haber, MD  benzonatate (TESSALON) 100 MG capsule Take 1-2 capsules (100-200 mg total) by mouth 2 (two) times daily as needed for cough. 05/12/19   Robyn Haber, MD    Family History Family History  Problem Relation Age of Onset  .  Brain cancer Father   . Throat cancer Brother     Social History Social History   Tobacco Use  . Smoking status: Former Smoker    Packs/day: 3.00    Years: 10.00    Pack years: 30.00    Types: Cigarettes    Quit date: 05/11/1958    Years since quitting: 61.0  . Smokeless tobacco: Never Used  Substance Use Topics  . Alcohol use: No  . Drug use: No     Allergies   Erythromycin base, Cefadroxil, Ciprofloxacin, and Erythromycin   Review of Systems Review of Systems  Constitutional: Negative.   HENT: Positive for rhinorrhea and sore throat.   Respiratory: Positive for cough.   Cardiovascular: Positive for chest pain.     Physical Exam Triage Vital Signs ED Triage Vitals  Enc Vitals Group     BP      Pulse      Resp      Temp      Temp src      SpO2      Weight      Height      Head Circumference      Peak Flow      Pain Score      Pain Loc      Pain Edu?      Excl. in [place]?    No data found.  Updated Vital Signs BP 124/66 (BP Location: Left Arm)   Pulse (!) 106   Temp 99.4 F (37.4 C) (Oral)   Resp 16   Ht 5\' 6"  (1.676 m)   Wt 85.3 kg   SpO2 96%   BMI 30.34 kg/m    Physical Exam Vitals and nursing note reviewed.  Constitutional:      General: He is not in acute distress.    Appearance: Normal appearance. He is obese. He is not ill-appearing.  HENT:     Head: Normocephalic.     Nose: Nose normal.      Mouth/Throat:     Pharynx: Oropharynx is clear.  Eyes:     Conjunctiva/sclera: Conjunctivae normal.  Cardiovascular:     Rate and Rhythm: Normal rate. Rhythm irregular.  Pulmonary:     Effort: Pulmonary effort is normal.     Breath sounds: Normal breath sounds.  Musculoskeletal:        General: Normal range of motion.     Cervical back: Normal range of motion and neck supple.  Skin:    General: Skin is warm and dry.  Neurological:     General: No focal deficit present.     Mental Status: He is alert.  Psychiatric:        Mood and Affect: Mood normal.        Behavior: Behavior normal.        Thought Content: Thought content normal.      UC Treatments / Results  Labs (all labs ordered are listed, but only abnormal results are displayed) Labs Reviewed - No data to display  EKG   Radiology No results found.  Procedures Procedures (including critical care time)  Medications Ordered in UC Medications - No data to display  Initial Impression / Assessment and Plan / UC Course  I have reviewed the triage vital signs and the nursing notes.  Pertinent labs & imaging results that were available during my care of the patient were reviewed by me and considered in my medical decision making (see chart for details).  Final Clinical Impressions(s) / UC Diagnoses   Final diagnoses:  Viral URI with cough   Discharge Instructions   None    ED Prescriptions    Medication Sig Dispense Auth. Provider   azelastine (ASTELIN) 0.1 % nasal spray Place 1 spray into both nostrils 2 (two) times daily. Use in each nostril as directed 30 mL Robyn Haber, MD   benzonatate (TESSALON) 100 MG capsule Take 1-2 capsules (100-200 mg total) by mouth 2 (two) times daily as needed for cough. 40 capsule Robyn Haber, MD     I have reviewed the PDMP during this encounter.   Robyn Haber, MD 05/12/19 1130

## 2019-05-13 ENCOUNTER — Other Ambulatory Visit: Payer: Self-pay

## 2019-05-13 ENCOUNTER — Inpatient Hospital Stay (HOSPITAL_COMMUNITY)
Admission: EM | Admit: 2019-05-13 | Discharge: 2019-05-18 | DRG: 193 | Disposition: A | Discharge status: Home Health | Payer: Medicare Other | Attending: Internal Medicine | Admitting: Internal Medicine

## 2019-05-13 ENCOUNTER — Encounter (HOSPITAL_COMMUNITY): Payer: Self-pay | Admitting: Family Medicine

## 2019-05-13 ENCOUNTER — Emergency Department (HOSPITAL_COMMUNITY): Payer: Medicare Other

## 2019-05-13 DIAGNOSIS — Z8701 Personal history of pneumonia (recurrent): Secondary | ICD-10-CM

## 2019-05-13 DIAGNOSIS — J918 Pleural effusion in other conditions classified elsewhere: Secondary | ICD-10-CM | POA: Diagnosis present

## 2019-05-13 DIAGNOSIS — I2510 Atherosclerotic heart disease of native coronary artery without angina pectoris: Secondary | ICD-10-CM | POA: Diagnosis present

## 2019-05-13 DIAGNOSIS — I110 Hypertensive heart disease with heart failure: Secondary | ICD-10-CM | POA: Diagnosis present

## 2019-05-13 DIAGNOSIS — I10 Essential (primary) hypertension: Secondary | ICD-10-CM | POA: Diagnosis not present

## 2019-05-13 DIAGNOSIS — R52 Pain, unspecified: Secondary | ICD-10-CM

## 2019-05-13 DIAGNOSIS — Z96641 Presence of right artificial hip joint: Secondary | ICD-10-CM | POA: Diagnosis present

## 2019-05-13 DIAGNOSIS — Z8673 Personal history of transient ischemic attack (TIA), and cerebral infarction without residual deficits: Secondary | ICD-10-CM

## 2019-05-13 DIAGNOSIS — I959 Hypotension, unspecified: Secondary | ICD-10-CM | POA: Diagnosis present

## 2019-05-13 DIAGNOSIS — N400 Enlarged prostate without lower urinary tract symptoms: Secondary | ICD-10-CM | POA: Diagnosis present

## 2019-05-13 DIAGNOSIS — E119 Type 2 diabetes mellitus without complications: Secondary | ICD-10-CM | POA: Diagnosis present

## 2019-05-13 DIAGNOSIS — J9601 Acute respiratory failure with hypoxia: Secondary | ICD-10-CM | POA: Diagnosis present

## 2019-05-13 DIAGNOSIS — R0602 Shortness of breath: Secondary | ICD-10-CM

## 2019-05-13 DIAGNOSIS — R0902 Hypoxemia: Secondary | ICD-10-CM | POA: Diagnosis not present

## 2019-05-13 DIAGNOSIS — M24274 Disorder of ligament, right foot: Secondary | ICD-10-CM | POA: Diagnosis present

## 2019-05-13 DIAGNOSIS — J181 Lobar pneumonia, unspecified organism: Principal | ICD-10-CM | POA: Diagnosis present

## 2019-05-13 DIAGNOSIS — Z9861 Coronary angioplasty status: Secondary | ICD-10-CM | POA: Diagnosis not present

## 2019-05-13 DIAGNOSIS — Z808 Family history of malignant neoplasm of other organs or systems: Secondary | ICD-10-CM

## 2019-05-13 DIAGNOSIS — I6521 Occlusion and stenosis of right carotid artery: Secondary | ICD-10-CM | POA: Diagnosis present

## 2019-05-13 DIAGNOSIS — I25810 Atherosclerosis of coronary artery bypass graft(s) without angina pectoris: Secondary | ICD-10-CM | POA: Diagnosis present

## 2019-05-13 DIAGNOSIS — E669 Obesity, unspecified: Secondary | ICD-10-CM | POA: Diagnosis present

## 2019-05-13 DIAGNOSIS — K120 Recurrent oral aphthae: Secondary | ICD-10-CM | POA: Diagnosis present

## 2019-05-13 DIAGNOSIS — I4891 Unspecified atrial fibrillation: Secondary | ICD-10-CM | POA: Diagnosis not present

## 2019-05-13 DIAGNOSIS — Z7901 Long term (current) use of anticoagulants: Secondary | ICD-10-CM | POA: Diagnosis not present

## 2019-05-13 DIAGNOSIS — K219 Gastro-esophageal reflux disease without esophagitis: Secondary | ICD-10-CM | POA: Diagnosis present

## 2019-05-13 DIAGNOSIS — J90 Pleural effusion, not elsewhere classified: Secondary | ICD-10-CM | POA: Diagnosis not present

## 2019-05-13 DIAGNOSIS — J029 Acute pharyngitis, unspecified: Secondary | ICD-10-CM | POA: Diagnosis present

## 2019-05-13 DIAGNOSIS — J189 Pneumonia, unspecified organism: Secondary | ICD-10-CM | POA: Diagnosis not present

## 2019-05-13 DIAGNOSIS — Z881 Allergy status to other antibiotic agents status: Secondary | ICD-10-CM

## 2019-05-13 DIAGNOSIS — Z794 Long term (current) use of insulin: Secondary | ICD-10-CM | POA: Diagnosis not present

## 2019-05-13 DIAGNOSIS — E785 Hyperlipidemia, unspecified: Secondary | ICD-10-CM | POA: Diagnosis present

## 2019-05-13 DIAGNOSIS — I5032 Chronic diastolic (congestive) heart failure: Secondary | ICD-10-CM | POA: Diagnosis present

## 2019-05-13 DIAGNOSIS — E876 Hypokalemia: Secondary | ICD-10-CM | POA: Diagnosis not present

## 2019-05-13 DIAGNOSIS — I350 Nonrheumatic aortic (valve) stenosis: Secondary | ICD-10-CM | POA: Diagnosis present

## 2019-05-13 DIAGNOSIS — D649 Anemia, unspecified: Secondary | ICD-10-CM | POA: Diagnosis present

## 2019-05-13 DIAGNOSIS — E1121 Type 2 diabetes mellitus with diabetic nephropathy: Secondary | ICD-10-CM

## 2019-05-13 DIAGNOSIS — D72820 Lymphocytosis (symptomatic): Secondary | ICD-10-CM | POA: Diagnosis not present

## 2019-05-13 DIAGNOSIS — Z79891 Long term (current) use of opiate analgesic: Secondary | ICD-10-CM

## 2019-05-13 DIAGNOSIS — M79671 Pain in right foot: Secondary | ICD-10-CM | POA: Diagnosis not present

## 2019-05-13 DIAGNOSIS — I472 Ventricular tachycardia: Secondary | ICD-10-CM | POA: Diagnosis not present

## 2019-05-13 DIAGNOSIS — I712 Thoracic aortic aneurysm, without rupture: Secondary | ICD-10-CM | POA: Diagnosis present

## 2019-05-13 DIAGNOSIS — R0789 Other chest pain: Secondary | ICD-10-CM | POA: Diagnosis not present

## 2019-05-13 DIAGNOSIS — I152 Hypertension secondary to endocrine disorders: Secondary | ICD-10-CM | POA: Diagnosis present

## 2019-05-13 DIAGNOSIS — Z209 Contact with and (suspected) exposure to unspecified communicable disease: Secondary | ICD-10-CM | POA: Diagnosis not present

## 2019-05-13 DIAGNOSIS — Z955 Presence of coronary angioplasty implant and graft: Secondary | ICD-10-CM | POA: Diagnosis not present

## 2019-05-13 DIAGNOSIS — R197 Diarrhea, unspecified: Secondary | ICD-10-CM | POA: Diagnosis present

## 2019-05-13 DIAGNOSIS — Z20822 Contact with and (suspected) exposure to covid-19: Secondary | ICD-10-CM | POA: Diagnosis present

## 2019-05-13 DIAGNOSIS — R0689 Other abnormalities of breathing: Secondary | ICD-10-CM | POA: Diagnosis not present

## 2019-05-13 DIAGNOSIS — I482 Chronic atrial fibrillation, unspecified: Secondary | ICD-10-CM | POA: Diagnosis present

## 2019-05-13 DIAGNOSIS — Z9889 Other specified postprocedural states: Secondary | ICD-10-CM

## 2019-05-13 DIAGNOSIS — I4821 Permanent atrial fibrillation: Secondary | ICD-10-CM | POA: Diagnosis present

## 2019-05-13 DIAGNOSIS — Z6833 Body mass index (BMI) 33.0-33.9, adult: Secondary | ICD-10-CM

## 2019-05-13 DIAGNOSIS — Z87891 Personal history of nicotine dependence: Secondary | ICD-10-CM

## 2019-05-13 DIAGNOSIS — J9811 Atelectasis: Secondary | ICD-10-CM | POA: Diagnosis not present

## 2019-05-13 DIAGNOSIS — M65871 Other synovitis and tenosynovitis, right ankle and foot: Secondary | ICD-10-CM | POA: Diagnosis not present

## 2019-05-13 DIAGNOSIS — Z79899 Other long term (current) drug therapy: Secondary | ICD-10-CM

## 2019-05-13 DIAGNOSIS — R079 Chest pain, unspecified: Secondary | ICD-10-CM | POA: Diagnosis not present

## 2019-05-13 LAB — URINALYSIS, ROUTINE W REFLEX MICROSCOPIC
Bilirubin Urine: NEGATIVE
Glucose, UA: >=500 mg/dL — AB
Ketones, ur: NEGATIVE mg/dL
Leukocytes,Ua: NEGATIVE
Nitrite: NEGATIVE
Protein, ur: NEGATIVE mg/dL
Specific Gravity, Urine: 1.027 (ref 1.005–1.030)
pH: 5 (ref 5.0–8.0)

## 2019-05-13 LAB — COMPREHENSIVE METABOLIC PANEL
ALT: 15 U/L (ref 0–44)
AST: 19 U/L (ref 15–41)
Albumin: 2.7 g/dL — ABNORMAL LOW (ref 3.5–5.0)
Alkaline Phosphatase: 75 U/L (ref 38–126)
Anion gap: 14 (ref 5–15)
BUN: 16 mg/dL (ref 8–23)
CO2: 22 mmol/L (ref 22–32)
Calcium: 8.6 mg/dL — ABNORMAL LOW (ref 8.9–10.3)
Chloride: 100 mmol/L (ref 98–111)
Creatinine, Ser: 1.14 mg/dL (ref 0.61–1.24)
GFR calc Af Amer: >60 mL/min (ref >60)
GFR calc non Af Amer: 57 mL/min — ABNORMAL LOW (ref >60)
Glucose, Bld: 245 mg/dL — ABNORMAL HIGH (ref 70–99)
Potassium: 3.5 mmol/L (ref 3.5–5.1)
Sodium: 136 mmol/L (ref 135–145)
Total Bilirubin: 1.2 mg/dL (ref 0.3–1.2)
Total Protein: 5.7 g/dL — ABNORMAL LOW (ref 6.5–8.1)

## 2019-05-13 LAB — CBC WITH DIFFERENTIAL/PLATELET
Abs Immature Granulocytes: 0.12 10*3/uL — ABNORMAL HIGH (ref 0.00–0.07)
Basophils Absolute: 0.1 10*3/uL (ref 0.0–0.1)
Basophils Relative: 0 %
Eosinophils Absolute: 0 10*3/uL (ref 0.0–0.5)
Eosinophils Relative: 0 %
HCT: 37.5 % — ABNORMAL LOW (ref 39.0–52.0)
Hemoglobin: 11.7 g/dL — ABNORMAL LOW (ref 13.0–17.0)
Immature Granulocytes: 1 %
Lymphocytes Relative: 5 %
Lymphs Abs: 1 10*3/uL (ref 0.7–4.0)
MCH: 25.9 pg — ABNORMAL LOW (ref 26.0–34.0)
MCHC: 31.2 g/dL (ref 30.0–36.0)
MCV: 83.1 fL (ref 80.0–100.0)
Monocytes Absolute: 2.2 10*3/uL — ABNORMAL HIGH (ref 0.1–1.0)
Monocytes Relative: 11 %
Neutro Abs: 17 10*3/uL — ABNORMAL HIGH (ref 1.7–7.7)
Neutrophils Relative %: 83 %
Platelets: 391 10*3/uL (ref 150–400)
RBC: 4.51 MIL/uL (ref 4.22–5.81)
RDW: 15.8 % — ABNORMAL HIGH (ref 11.5–15.5)
WBC: 20.4 10*3/uL — ABNORMAL HIGH (ref 4.0–10.5)
nRBC: 0 % (ref 0.0–0.2)

## 2019-05-13 LAB — RESPIRATORY PANEL BY RT PCR (FLU A&B, COVID)
Influenza A by PCR: NEGATIVE
Influenza B by PCR: NEGATIVE
SARS Coronavirus 2 by RT PCR: NEGATIVE

## 2019-05-13 LAB — POC SARS CORONAVIRUS 2 AG -  ED: SARS Coronavirus 2 Ag: NEGATIVE

## 2019-05-13 LAB — CBG MONITORING, ED: Glucose-Capillary: 162 mg/dL — ABNORMAL HIGH (ref 70–99)

## 2019-05-13 LAB — STREP PNEUMONIAE URINARY ANTIGEN: Strep Pneumo Urinary Antigen: NEGATIVE

## 2019-05-13 LAB — PROTIME-INR
INR: 2.1 — ABNORMAL HIGH (ref 0.8–1.2)
Prothrombin Time: 23 seconds — ABNORMAL HIGH (ref 11.4–15.2)

## 2019-05-13 LAB — LACTIC ACID, PLASMA: Lactic Acid, Venous: 1.3 mmol/L (ref 0.5–1.9)

## 2019-05-13 LAB — APTT: aPTT: 46 seconds — ABNORMAL HIGH (ref 24–36)

## 2019-05-13 MED ORDER — DILTIAZEM HCL ER COATED BEADS 180 MG PO CP24
360.0000 mg | ORAL_CAPSULE | Freq: Every day | ORAL | Status: DC
  Administered 2019-05-14 – 2019-05-18 (×5): 360 mg via ORAL
  Filled 2019-05-13 (×6): qty 2

## 2019-05-13 MED ORDER — ONDANSETRON HCL 4 MG PO TABS: 4.0000 mg | ORAL_TABLET | Freq: Four times a day (QID) | ORAL | Status: DC | PRN

## 2019-05-13 MED ORDER — INSULIN GLARGINE 100 UNIT/ML ~~LOC~~ SOLN
20.0000 [IU] | Freq: Every day | SUBCUTANEOUS | Status: DC
  Administered 2019-05-14 – 2019-05-18 (×5): 20 [IU] via SUBCUTANEOUS
  Filled 2019-05-13 (×5): qty 0.2

## 2019-05-13 MED ORDER — OXYCODONE HCL 5 MG PO TABS
5.0000 mg | ORAL_TABLET | Freq: Three times a day (TID) | ORAL | Status: DC | PRN
  Administered 2019-05-17: 5 mg via ORAL
  Filled 2019-05-13: qty 1

## 2019-05-13 MED ORDER — SODIUM CHLORIDE 0.9 % IV SOLN
500.0000 mg | INTRAVENOUS | Status: DC
  Administered 2019-05-13: 500 mg via INTRAVENOUS
  Filled 2019-05-13: qty 500

## 2019-05-13 MED ORDER — INSULIN ASPART 100 UNIT/ML ~~LOC~~ SOLN
0.0000 [IU] | Freq: Three times a day (TID) | SUBCUTANEOUS | Status: DC
  Administered 2019-05-14: 3 [IU] via SUBCUTANEOUS
  Administered 2019-05-14: 1 [IU] via SUBCUTANEOUS
  Administered 2019-05-14: 2 [IU] via SUBCUTANEOUS
  Administered 2019-05-15: 1 [IU] via SUBCUTANEOUS
  Administered 2019-05-15: 2 [IU] via SUBCUTANEOUS
  Administered 2019-05-15 – 2019-05-16 (×2): 1 [IU] via SUBCUTANEOUS
  Administered 2019-05-16: 5 [IU] via SUBCUTANEOUS
  Administered 2019-05-17: 2 [IU] via SUBCUTANEOUS
  Administered 2019-05-17: 3 [IU] via SUBCUTANEOUS
  Administered 2019-05-17: 5 [IU] via SUBCUTANEOUS
  Administered 2019-05-18: 3 [IU] via SUBCUTANEOUS

## 2019-05-13 MED ORDER — SODIUM CHLORIDE 0.9 % IV SOLN
2.0000 g | INTRAVENOUS | Status: DC
  Administered 2019-05-14 – 2019-05-17 (×4): 2 g via INTRAVENOUS
  Filled 2019-05-13: qty 2
  Filled 2019-05-13: qty 20
  Filled 2019-05-13 (×2): qty 2
  Filled 2019-05-13: qty 20

## 2019-05-13 MED ORDER — OXYCODONE-ACETAMINOPHEN 5-325 MG PO TABS
1.0000 | ORAL_TABLET | Freq: Three times a day (TID) | ORAL | Status: DC | PRN
  Administered 2019-05-15 – 2019-05-17 (×3): 1 via ORAL
  Filled 2019-05-13 (×3): qty 1

## 2019-05-13 MED ORDER — LACTATED RINGERS IV BOLUS (SEPSIS)
1000.0000 mL | Freq: Once | INTRAVENOUS | Status: AC
  Administered 2019-05-14: 1000 mL via INTRAVENOUS

## 2019-05-13 MED ORDER — INSULIN ASPART 100 UNIT/ML ~~LOC~~ SOLN
0.0000 [IU] | Freq: Every day | SUBCUTANEOUS | Status: DC
  Administered 2019-05-16: 2 [IU] via SUBCUTANEOUS

## 2019-05-13 MED ORDER — ONDANSETRON HCL 4 MG/2ML IJ SOLN: 4.0000 mg | Freq: Four times a day (QID) | INTRAMUSCULAR | Status: DC | PRN

## 2019-05-13 MED ORDER — GUAIFENESIN-DM 100-10 MG/5ML PO SYRP
5.0000 mL | ORAL_SOLUTION | ORAL | Status: DC | PRN
  Administered 2019-05-14 – 2019-05-16 (×6): 5 mL via ORAL
  Filled 2019-05-13 (×6): qty 5

## 2019-05-13 MED ORDER — SODIUM CHLORIDE 0.9 % IV SOLN
500.0000 mg | INTRAVENOUS | Status: DC
  Administered 2019-05-14 – 2019-05-17 (×4): 500 mg via INTRAVENOUS
  Filled 2019-05-13 (×5): qty 500

## 2019-05-13 MED ORDER — SODIUM CHLORIDE 0.9 % IV SOLN
2.0000 g | INTRAVENOUS | Status: DC
  Administered 2019-05-13: 2 g via INTRAVENOUS
  Filled 2019-05-13: qty 20

## 2019-05-13 NOTE — Progress Notes (Signed)
ANTICOAGULATION CONSULT NOTE - Initial Consult  Pharmacy Consult for Heparin (Apixaban on hold) Indication: atrial fibrillation  Allergies  Allergen Reactions  . Erythromycin Base   . Cefadroxil Other (See Comments)    Unknown Unknown  . Ciprofloxacin Other (See Comments)    Unknown Unknown  . Erythromycin Other (See Comments)    Upset stomach Upset stomach    Patient Measurements: Height: 5\' 6"  (167.6 cm) Weight: 188 lb (85.3 kg) IBW/kg (Calculated) : 63.8  Vital Signs: Temp: 98.1 F (36.7 C) (03/14 2048) Temp Source: Oral (03/14 2048) BP: 107/57 (03/14 2245) Pulse Rate: 98 (03/14 2245)  Labs: Recent Labs    05/13/19 1950  HGB 11.7*  HCT 37.5*  PLT 391  APTT 46*  LABPROT 23.0*  INR 2.1*  CREATININE 1.14    Estimated Creatinine Clearance: 45.9 mL/min (by C-G formula based on SCr of 1.14 mg/dL).   Medical History: Past Medical History:  Diagnosis Date  . Abdominal pain   . Acute renal failure ([place])     resolved  . Arthritis    "hands & legs" (11/'10/2014)  . Ataxia   . Atrial fibrillation ([place])   . Bladder outlet obstruction   . Bladder outlet obstruction   . BPH (benign prostatic hyperplasia)   . CAD (coronary artery disease)    a. CABG IN 1989. b. 01/08/2015 CTO of ost LAD, LIMA to LAD not visualized but assumed patent given myoview finding, occluded SVG to diagonal, 99% mid RCA tx w/ SYNERGY DES 3X28 mm  . Constipation   . Diabetes mellitus type 2, insulin dependent ([place])   . Epistaxis, recurrent Feb 2017  . GERD (gastroesophageal reflux disease)   . HTN (hypertension)   . Hx of bacterial pneumonia   . Hyperlipemia   . Kidney stones   . Mild aortic stenosis   . Rib fractures    left rib fractures being treated with pain medications  . Stented coronary artery Nov 2016   RCA DES  . Urinary tract infection     Enterococcus    Assessment: 86 y/o M presents to the ED with PNA/pleural effusion. On apxiaban PTA for afib. Holding apixaban and  starting heparin in case pt needs thoracentesis/chest tube. Last dose of apixaban was greater than 12 hours ago. Will start heparin now. Anticipate using aPTT to dose for now.   Goal of Therapy:  Heparin level 0.3-0.7 units/ml aPTT 66-102 seconds Monitor platelets by anticoagulation protocol: Yes   Plan:  Start heparin drip at 1000 units/hr 0900 aPTT/HL Daily CBC/HL/aPTT Monitor for bleeding  Narda Bonds, PharmD, BCPS Clinical Pharmacist Phone: [phone]

## 2019-05-13 NOTE — ED Notes (Signed)
IV attempted x2 without success.

## 2019-05-13 NOTE — ED Notes (Signed)
Dr Maryan Rued informed POC covid test neg

## 2019-05-13 NOTE — H&P (Signed)
History and Physical    [name] [id number] DOB: 1928/06/08 DOA: 05/13/2019  PCP: [name], MD   Patient coming from: Home   Chief Complaint: Sore throat, rhinorrhea, chest congestion, fatigue   HPI: [name] is a 86 y.o. male with medical history significant for chronic atrial fibrillation on Eliquis, coronary artery disease, chronic diastolic CHF, insulin-dependent diabetes mellitus, and hypertension, now presenting to the emergency department for evaluation of sore throat, rhinorrhea, cough, and chest congestion.  Patient reports that the symptoms began 1 week ago, states that his PCP recommended crushing baby aspirin, dissolving and water, and gargling.  He experienced significant improvement with this initially, but began to worsen again over the past 2 days.  Over the past 2 days, he has had worsening cough, mainly nonproductive, with left sided pleuritic pain, and worsening fatigue.  His sore throat is also worsened.  He denies any leg swelling or tenderness, denies headache, and reports that he received his second Covid vaccination in mid February.  He slept much of the day today, had loss of appetite, and eventually asked his wife to call EMS.  He was found to be febrile and saturating in the upper 80s on room air, was given acetaminophen and supplemental oxygen, and brought into the ED.  ED Course: Upon arrival to the ED, patient is found to be afebrile, saturating mid 90s on 2 L/min of supplemental oxygen, mildly tachypneic, mildly tachycardic, and with stable blood pressure.  EKG features atrial fibrillation and chest x-ray concerning for left basilar atelectasis and/or infiltrate with large left pleural effusion.  Chemistry panel notable for glucose of 245 and albumin 2.7.  CBC with leukocytosis to 20,400 and mild normocytic anemia.  Lactic acid reassuringly normal and INR 2.1.  Patient was given a liter of lactated Ringer's, Rocephin, and azithromycin in the ED.   Blood cultures were collected.  Esther from Infection Prevention recommended checking 2-hr COVID pcr and maintaining isolation in the meantime; this test is pending.   Review of Systems:  All other systems reviewed and apart from HPI, are negative.  Past Medical History:  Diagnosis Date  . Abdominal pain   . Acute renal failure ([place])     resolved  . Arthritis    "hands & legs" (11/'10/2014)  . Ataxia   . Atrial fibrillation ([place])   . Bladder outlet obstruction   . Bladder outlet obstruction   . BPH (benign prostatic hyperplasia)   . CAD (coronary artery disease)    a. CABG IN 1989. b. 01/08/2015 CTO of ost LAD, LIMA to LAD not visualized but assumed patent given myoview finding, occluded SVG to diagonal, 99% mid RCA tx w/ SYNERGY DES 3X28 mm  . Constipation   . Diabetes mellitus type 2, insulin dependent ([place])   . Epistaxis, recurrent Feb 2017  . GERD (gastroesophageal reflux disease)   . HTN (hypertension)   . Hx of bacterial pneumonia   . Hyperlipemia   . Kidney stones   . Mild aortic stenosis   . Rib fractures    left rib fractures being treated with pain medications  . Stented coronary artery Nov 2016   RCA DES  . Urinary tract infection     Enterococcus    Past Surgical History:  Procedure Laterality Date  . CARDIAC CATHETERIZATION  1989  . CARDIAC CATHETERIZATION N/A 01/08/2015   Procedure: Left Heart Cath and Cors/Grafts Angiography;  Surgeon: Peter M Martinique, MD;  Location: [facility];  Service: Cardiovascular;  Laterality: N/A;  . CARDIAC CATHETERIZATION  01/08/2015   Procedure: Coronary Stent Intervention;  Surgeon: Peter M Martinique, MD;  Location: [facility];  Service: Cardiovascular;;  . CARPAL TUNNEL RELEASE Right 08/2010  . CATARACT EXTRACTION W/ INTRAOCULAR LENS  IMPLANT, BILATERAL Bilateral   . CORONARY ANGIOPLASTY  01/08/15   RCA DES  . CORONARY ARTERY BYPASS GRAFT  1989   "CABG X 2"  . JOINT REPLACEMENT    . TONSILLECTOMY    . TOTAL HIP  ARTHROPLASTY Right 2000     reports that he quit smoking about 61 years ago. His smoking use included cigarettes. He has a 30.00 pack-year smoking history. He has never used smokeless tobacco. He reports that he does not drink alcohol or use drugs.  Allergies  Allergen Reactions  . Erythromycin Base   . Cefadroxil Other (See Comments)    Unknown Unknown  . Ciprofloxacin Other (See Comments)    Unknown Unknown  . Erythromycin Other (See Comments)    Upset stomach Upset stomach    Family History  Problem Relation Age of Onset  . Brain cancer Father   . Throat cancer Brother      Prior to Admission medications   Medication Sig Start Date End Date Taking? Authorizing Provider  apixaban (ELIQUIS) 5 MG TABS tablet Take 1 tablet (5 mg total) by mouth 2 (two) times daily. 06/08/16   Reyne Dumas, MD  atorvastatin (LIPITOR) 40 MG tablet Take 1 tablet (40 mg total) by mouth daily at 6 PM. 06/08/16   Reyne Dumas, MD  AVODART 0.5 MG capsule Take 0.5 mg by mouth every other day.  07/20/11   [provider]  azelastine (ASTELIN) 0.1 % nasal spray Place 1 spray into both nostrils 2 (two) times daily. Use in each nostril as directed 05/12/19   Robyn Haber, MD  benzonatate (TESSALON) 100 MG capsule Take 1-2 capsules (100-200 mg total) by mouth 2 (two) times daily as needed for cough. 05/12/19   Robyn Haber, MD  diltiazem (CARDIZEM CD) 360 MG 24 hr capsule Take 1 capsule (360 mg total) by mouth daily. 01/10/15   Martinique, Peter M, MD  empagliflozin (JARDIANCE) 10 MG TABS tablet Take 10 mg by mouth daily.    [provider]  fluticasone (FLONASE) 50 MCG/ACT nasal spray Place 1 spray into both nostrils daily. 04/25/19   [name], MD  furosemide (LASIX) 40 MG tablet Take 40 mg by mouth.    [provider]  gabapentin (NEURONTIN) 300 MG capsule Take 1 capsule (300 mg total) by mouth 3 (three) times daily. 11/08/18   [name], MD  insulin glargine (LANTUS)  100 UNIT/ML injection Inject 40 Units into the skin daily.    [provider]  metFORMIN (GLUCOPHAGE) 500 MG tablet Take 500 mg by mouth 2 (two) times daily with a meal.    [provider]  metoprolol succinate (TOPROL-XL) 100 MG 24 hr tablet TAKE 1 TABLET BY MOUTH TWICE DAILY 06/19/18   [name], MD  nitroGLYCERIN (NITROSTAT) 0.4 MG SL tablet Place 0.4 mg under the tongue every 5 (five) minutes as needed for chest pain (x 3 doses).    [provider]  oxyCODONE-acetaminophen (PERCOCET) 10-325 MG tablet Take 1 tablet by mouth 2 (two) times daily as needed for pain. 02/19/19   [name], MD  pantoprazole (PROTONIX) 40 MG tablet TAKE 1 TABLET BY MOUTH DAILY AT NOON 09/18/18   Martinique, Peter M, MD  potassium  chloride (KLOR-CON) 20 MEQ packet Take by mouth 2 (two) times daily.    [provider]  potassium chloride SA (KLOR-CON) 20 MEQ tablet Take 20 mEq by mouth daily. 04/18/19   [provider]  PRECISION XTRA TEST STRIPS test strip USE TO TEST BLOOD SUGAR 1 TO 2 TIMES DAILY 05/10/19   [name], MD  Tamsulosin HCl (FLOMAX) 0.4 MG CAPS Take 0.4 mg by mouth every other day.     [provider]    Physical Exam: Vitals:   05/13/19 2048 05/13/19 2050 05/13/19 2100 05/13/19 2115  BP: (!) 150/54  (!) 118/52 127/65  Pulse:   80 93  Resp: 20  17 (!) 22  Temp: 98.1 F (36.7 C)     TempSrc: Oral     SpO2: 95%  94% 95%  Weight:  85.3 kg    Height:  5\' 6"  (1.676 m)      Constitutional: No acute distress, calm  Eyes: PERTLA, lids and conjunctivae normal ENMT: Mucous membranes are moist. Posterior pharynx clear of any exudate or lesions.   Neck: normal, supple, no masses, no thyromegaly Respiratory: Diminished on left, mild tachypnea. No pallor or cyanosis.  Cardiovascular: Rate ~80 and irregularly irregular. No extremity edema.   Abdomen: No distension, no tenderness, soft. Bowel sounds active.  Musculoskeletal: no clubbing /  cyanosis. No joint deformity upper and lower extremities.   Skin: no significant rashes, lesions, ulcers. Warm, dry, well-perfused. Neurologic: No facial asymmetry. Sensation intact. Moving all extremities.  Psychiatric: Alert and oriented, appropriate throughout interview and exam. Very pleasant and cooperative.    Labs and Imaging on Admission: I have personally reviewed following labs and imaging studies  CBC: Recent Labs  Lab 05/13/19 1950  WBC 20.4*  NEUTROABS 17.0*  HGB 11.7*  HCT 37.5*  MCV 83.1  PLT 0000000   Basic Metabolic Panel: Recent Labs  Lab 05/13/19 1950  NA 136  K 3.5  CL 100  CO2 22  GLUCOSE 245*  BUN 16  CREATININE 1.14  CALCIUM 8.6*   GFR: Estimated Creatinine Clearance: 45.9 mL/min (by C-G formula based on SCr of 1.14 mg/dL). Liver Function Tests: Recent Labs  Lab 05/13/19 1950  AST 19  ALT 15  ALKPHOS 75  BILITOT 1.2  PROT 5.7*  ALBUMIN 2.7*   No results for input(s): LIPASE, AMYLASE in the last 168 hours. No results for input(s): AMMONIA in the last 168 hours. Coagulation Profile: Recent Labs  Lab 05/13/19 1950  INR 2.1*   Cardiac Enzymes: No results for input(s): CKTOTAL, CKMB, CKMBINDEX, TROPONINI in the last 168 hours. BNP (last 3 results) No results for input(s): PROBNP in the last 8760 hours. HbA1C: No results for input(s): HGBA1C in the last 72 hours. CBG: No results for input(s): GLUCAP in the last 168 hours. Lipid Profile: No results for input(s): CHOL, HDL, LDLCALC, TRIG, CHOLHDL, LDLDIRECT in the last 72 hours. Thyroid Function Tests: No results for input(s): TSH, T4TOTAL, FREET4, T3FREE, THYROIDAB in the last 72 hours. Anemia Panel: No results for input(s): VITAMINB12, FOLATE, FERRITIN, TIBC, IRON, RETICCTPCT in the last 72 hours. Urine analysis:    Component Value Date/Time   COLORURINE YELLOW 05/13/2019 2048   APPEARANCEUR CLEAR 05/13/2019 2048   LABSPEC 1.027 05/13/2019 2048   PHURINE 5.0 05/13/2019 2048    GLUCOSEU >=500 (A) 05/13/2019 2048   HGBUR MODERATE (A) 05/13/2019 2048   BILIRUBINUR NEGATIVE 05/13/2019 2048   BILIRUBINUR Negative 03/24/2018 [place] 05/13/2019 2048  PROTEINUR NEGATIVE 05/13/2019 2048   UROBILINOGEN 0.2 03/24/2018 0841   UROBILINOGEN 0.2 11/28/2010 1156   NITRITE NEGATIVE 05/13/2019 2048   LEUKOCYTESUR NEGATIVE 05/13/2019 2048   Sepsis Labs: @LABRCNTIP (procalcitonin:4,lacticidven:4) )No results found for this or any previous visit (from the past 240 hour(s)).   Radiological Exams on Admission: DG Chest Port 1 View  Result Date: 05/13/2019 CLINICAL DATA:  Shortness of breath and cough. EXAM: PORTABLE CHEST 1 VIEW COMPARISON:  September 08, 2017 FINDINGS: Multiple sternal wires and vascular clips are seen. Moderate severity atelectasis and/or infiltrate is seen within the left lung base. There is a large left pleural effusion. No pneumothorax is identified. There is stable mild to moderate severity enlargement of the cardiac silhouette. There is marked severity calcification of the aortic arch. Multilevel degenerative changes seen throughout the thoracic spine. IMPRESSION: 1. Moderate severity left basilar atelectasis and/or infiltrate. 2. Large left pleural effusion. 3. Stable cardiomegaly with evidence of prior CABG. Electronically Signed   By: Virgina Norfolk M.D.   On: 05/13/2019 20:23    EKG: Independently reviewed. Atrial fibrillation, rate 87, QTc 444 ms.   Assessment/Plan   1. Pneumonia; large left pleural effusion  - Presents with sore throat, cough, left-sided pleuritic pain, and fatigue, was febrile and hypoxic with EMS, found to have WBC 20.4k and LLL pneumonia with large pleural effusion  - Blood cultures collected in ED and Rocephin and azithromycin started  - Check sputum culture and strep pneumo antigen, continue Rocephin and azithromycin, trend procalcitonin, follow cultures and clinical course, consider thoracentesis when INR decreases     2. Atrial fibrillation  - In rate-controlled a fib in ED  - He will likely need thoracentesis this admission, INR is 2.1 in ED, will hold Eliquis, use IV heparin for now, repeat INR in am    3. CAD  - Has left lateral chest pain with cough likely secondary to PNA/pleural effusion, but no anginal complaint   4. Chronic diastolic CHF  - Appears compensated  - He has acute infectious illness, concern for developing sepsis, was given a liter of LR in ED, and diuretic held initially    5. Insulin-dependent DM  - A1c was 8.5% in December 2020  - Continue CBG checks and insulin     DVT prophylaxis: IV heparin, Eliquis pta  Code Status: Full  Family Communication: Discussed with patient  Disposition Plan: Likely home in 3-4 days pending improved and stable respiratory status; may need thoracentesis prior to discharge.  Consults called: None  Admission status: Inpatient. Patient admitted with CAP and large pleural effusion, is at increased risk for life-threatening complications due to age, underlying heart disease, and pleural effusion, has PORT/PSI score of 128, and will require inpatient management.    Vianne Bulls, MD [facility] Pager: See [email or web address]  If 7AM-7PM, please contact the daytime attending [email or web address]  05/13/2019, 9:49 PM

## 2019-05-13 NOTE — ED Provider Notes (Signed)
[name] Provider Note   CSN: [id number] Arrival date & time: 05/13/19  1901     History Chief Complaint  Patient presents with  . Shortness of Breath    [name] is a 86 y.o. male.  Patient is an 86 year old male with a history of coronary artery disease status post CABG, CVA, diastolic CHF with preserved EF, diabetes and chronic A. Fib on Eliquis who is presenting today with symptoms of shortness of breath, fever, cough and left-sided chest pain.  Patient reports that on Tuesday he started having a sore throat and some congestion.  He saw his PCP and they recommended taking aspirin which she did and after 2 to 3 days he was feeling better.  Then 2 days prior to arrival he started to feel much worse.  He started to have cough, some mild shortness of breath and went to urgent care yesterday.  At that time he was diagnosed with a sinus infection and given antibiotics.  He reports that he took 1 dose but today he was feeling much worse.  Patient was found to be febrile by EMS at 101 and was given 1 g of Tylenol as well as hypoxic on room air and was started on 2 L of oxygen.  Patient denies any sore throat at this time.  He has had diarrhea but denies any nausea or vomiting.  Also admits to anorexia.  He has not noticed any swelling in his lower extremities.  The history is provided by the patient.  Shortness of Breath Severity:  Moderate Onset quality:  Gradual Duration:  2 days Timing:  Constant Progression:  Worsening Chronicity:  New Context: URI   Relieved by:  Nothing Worsened by:  Movement, exertion and coughing Associated symptoms: chest pain, cough and fever   Associated symptoms: no abdominal pain, no sputum production, no vomiting and no wheezing        Past Medical History:  Diagnosis Date  . Abdominal pain   . Acute renal failure ([place])     resolved  . Arthritis    "hands & legs" (11/'10/2014)  . Ataxia   . Atrial  fibrillation ([place])   . Bladder outlet obstruction   . Bladder outlet obstruction   . BPH (benign prostatic hyperplasia)   . CAD (coronary artery disease)    a. CABG IN 1989. b. 01/08/2015 CTO of ost LAD, LIMA to LAD not visualized but assumed patent given myoview finding, occluded SVG to diagonal, 99% mid RCA tx w/ SYNERGY DES 3X28 mm  . Constipation   . Diabetes mellitus type 2, insulin dependent ([place])   . Epistaxis, recurrent Feb 2017  . GERD (gastroesophageal reflux disease)   . HTN (hypertension)   . Hx of bacterial pneumonia   . Hyperlipemia   . Kidney stones   . Mild aortic stenosis   . Rib fractures    left rib fractures being treated with pain medications  . Stented coronary artery Nov 2016   RCA DES  . Urinary tract infection     Enterococcus    Patient Active Problem List   Diagnosis Date Noted  . Chronic diastolic CHF (congestive heart failure) ([place]) 03/14/2019  . Lower extremity edema 03/14/2019  . Hip pain 01/09/2019  . Spinal stenosis of lumbar region 02/21/2018  . Degeneration of lumbar intervertebral disc 02/21/2018  . History of revision of total replacement of right hip joint 01/03/2018  . Diabetic peripheral neuropathy associated with type 2 diabetes mellitus (  [place]) 05/05/2017  . Atherosclerotic heart disease of native coronary artery without angina pectoris 06/13/2016  . CVA (cerebral vascular accident) ([place]) 06/06/2016  . Leukocytosis 06/02/2016  . Carotid stenosis 06/02/2016  . Recurrent coronary arteriosclerosis after percutaneous transluminal coronary angioplasty 04/18/2015  . Epistaxis, recurrent 04/11/2015  . CAD S/P PCI- Nov 2016   . Chronic vertigo 03/15/2014  . Current use of long term anticoagulation 02/06/2013  . Obesity (BMI 30.0-34.9) 01/31/2012  . Osteoarthritis, knee 01/31/2012  . Allergic rhinitis 10/28/2011  . Chronic atrial fibrillation ([place]) 07/30/2011  . Hearing loss 06/08/2011  . Primary osteoarthritis of hand 06/08/2011  .  Enlarged prostate without lower urinary tract symptoms ([place]) 01/04/2011  . Gastro-esophageal reflux disease without esophagitis 12/07/2010  . Hx of CABG x 2 1990   . Essential (primary) hypertension   . Diabetes mellitus type 2, insulin dependent ([place])   . Hyperlipemia   . Hematuria 08/27/2008    Past Surgical History:  Procedure Laterality Date  . CARDIAC CATHETERIZATION  1989  . CARDIAC CATHETERIZATION N/A 01/08/2015   Procedure: Left Heart Cath and Cors/Grafts Angiography;  Surgeon: Peter M Martinique, MD;  Location: [facility];  Service: Cardiovascular;  Laterality: N/A;  . CARDIAC CATHETERIZATION  01/08/2015   Procedure: Coronary Stent Intervention;  Surgeon: Peter M Martinique, MD;  Location: [facility];  Service: Cardiovascular;;  . CARPAL TUNNEL RELEASE Right 08/2010  . CATARACT EXTRACTION W/ INTRAOCULAR LENS  IMPLANT, BILATERAL Bilateral   . CORONARY ANGIOPLASTY  01/08/15   RCA DES  . CORONARY ARTERY BYPASS GRAFT  1989   "CABG X 2"  . JOINT REPLACEMENT    . TONSILLECTOMY    . TOTAL HIP ARTHROPLASTY Right 2000       Family History  Problem Relation Age of Onset  . Brain cancer Father   . Throat cancer Brother     Social History   Tobacco Use  . Smoking status: Former Smoker    Packs/day: 3.00    Years: 10.00    Pack years: 30.00    Types: Cigarettes    Quit date: 05/11/1958    Years since quitting: 61.0  . Smokeless tobacco: Never Used  Substance Use Topics  . Alcohol use: No  . Drug use: No    Home Medications Prior to Admission medications   Medication Sig Start Date End Date Taking? Authorizing Provider  apixaban (ELIQUIS) 5 MG TABS tablet Take 1 tablet (5 mg total) by mouth 2 (two) times daily. 06/08/16   Reyne Dumas, MD  atorvastatin (LIPITOR) 40 MG tablet Take 1 tablet (40 mg total) by mouth daily at 6 PM. 06/08/16   Reyne Dumas, MD  AVODART 0.5 MG capsule Take 0.5 mg by mouth every other day.  07/20/11   [provider]  azelastine  (ASTELIN) 0.1 % nasal spray Place 1 spray into both nostrils 2 (two) times daily. Use in each nostril as directed 05/12/19   Robyn Haber, MD  benzonatate (TESSALON) 100 MG capsule Take 1-2 capsules (100-200 mg total) by mouth 2 (two) times daily as needed for cough. 05/12/19   Robyn Haber, MD  diltiazem (CARDIZEM CD) 360 MG 24 hr capsule Take 1 capsule (360 mg total) by mouth daily. 01/10/15   Martinique, Peter M, MD  empagliflozin (JARDIANCE) 10 MG TABS tablet Take 10 mg by mouth daily.    [provider]  fluticasone (FLONASE) 50 MCG/ACT nasal spray Place 1 spray into both nostrils daily. 04/25/19   [name],  MD  furosemide (LASIX) 40 MG tablet Take 40 mg by mouth.    [provider]  gabapentin (NEURONTIN) 300 MG capsule Take 1 capsule (300 mg total) by mouth 3 (three) times daily. 11/08/18   [name], MD  insulin glargine (LANTUS) 100 UNIT/ML injection Inject 40 Units into the skin daily.    [provider]  metFORMIN (GLUCOPHAGE) 500 MG tablet Take 500 mg by mouth 2 (two) times daily with a meal.    [provider]  metoprolol succinate (TOPROL-XL) 100 MG 24 hr tablet TAKE 1 TABLET BY MOUTH TWICE DAILY 06/19/18   [name], MD  nitroGLYCERIN (NITROSTAT) 0.4 MG SL tablet Place 0.4 mg under the tongue every 5 (five) minutes as needed for chest pain (x 3 doses).    [provider]  oxyCODONE-acetaminophen (PERCOCET) 10-325 MG tablet Take 1 tablet by mouth 2 (two) times daily as needed for pain. 02/19/19   [name], MD  pantoprazole (PROTONIX) 40 MG tablet TAKE 1 TABLET BY MOUTH DAILY AT NOON 09/18/18   Martinique, Peter M, MD  potassium chloride (KLOR-CON) 20 MEQ packet Take by mouth 2 (two) times daily.    [provider]  potassium chloride SA (KLOR-CON) 20 MEQ tablet Take 20 mEq by mouth daily. 04/18/19   [provider]  PRECISION XTRA TEST STRIPS test strip USE TO TEST BLOOD SUGAR 1 TO 2 TIMES DAILY 05/10/19    [name], MD  Tamsulosin HCl (FLOMAX) 0.4 MG CAPS Take 0.4 mg by mouth every other day.     [provider]    Allergies    Erythromycin base, Cefadroxil, Ciprofloxacin, and Erythromycin  Review of Systems   Review of Systems  Constitutional: Positive for fever.  Respiratory: Positive for cough and shortness of breath. Negative for sputum production and wheezing.   Cardiovascular: Positive for chest pain.  Gastrointestinal: Negative for abdominal pain and vomiting.  All other systems reviewed and are negative.   Physical Exam Updated Vital Signs BP 128/69 (BP Location: Right Arm)   Pulse (!) 111   Temp 99.2 F (37.3 C) (Oral)   Resp (!) 30   Ht 5\' 6"  (1.676 m)   Wt 85.3 kg   SpO2 91%   BMI 30.34 kg/m   Physical Exam Vitals and nursing note reviewed.  Constitutional:      General: He is not in acute distress.    Appearance: He is well-developed. He is obese. He is diaphoretic.  HENT:     Head: Normocephalic and atraumatic.  Eyes:     Conjunctiva/sclera: Conjunctivae normal.     Pupils: Pupils are equal, round, and reactive to light.  Cardiovascular:     Rate and Rhythm: Regular rhythm. Tachycardia present.     Heart sounds: No murmur.  Pulmonary:     Effort: Pulmonary effort is normal. Tachypnea present. No respiratory distress.     Breath sounds: Examination of the left-lower field reveals rhonchi. Rhonchi present. No wheezing or rales.  Abdominal:     General: There is no distension.     Palpations: Abdomen is soft.     Tenderness: There is no abdominal tenderness. There is no guarding or rebound.  Musculoskeletal:        General: No tenderness. Normal range of motion.     Cervical back: Normal range of motion and neck supple.     Right lower leg: No edema.     Left lower leg: No edema.  Skin:  General: Skin is warm.     Findings: No erythema or rash.  Neurological:     General: No focal deficit present.     Mental Status: He is alert and  oriented to person, place, and time.  Psychiatric:        Mood and Affect: Mood normal.        Behavior: Behavior normal.     ED Results / Procedures / Treatments   Labs (all labs ordered are listed, but only abnormal results are displayed) Labs Reviewed  COMPREHENSIVE METABOLIC PANEL - Abnormal; Notable for the following components:      Result Value   Glucose, Bld 245 (*)    Calcium 8.6 (*)    Total Protein 5.7 (*)    Albumin 2.7 (*)    GFR calc non Af Amer 57 (*)    All other components within normal limits  CBC WITH DIFFERENTIAL/PLATELET - Abnormal; Notable for the following components:   WBC 20.4 (*)    Hemoglobin 11.7 (*)    HCT 37.5 (*)    MCH 25.9 (*)    RDW 15.8 (*)    Neutro Abs 17.0 (*)    Monocytes Absolute 2.2 (*)    Abs Immature Granulocytes 0.12 (*)    All other components within normal limits  APTT - Abnormal; Notable for the following components:   aPTT 46 (*)    All other components within normal limits  PROTIME-INR - Abnormal; Notable for the following components:   Prothrombin Time 23.0 (*)    INR 2.1 (*)    All other components within normal limits  URINALYSIS, ROUTINE W REFLEX MICROSCOPIC - Abnormal; Notable for the following components:   Glucose, UA >=500 (*)    Hgb urine dipstick MODERATE (*)    Bacteria, UA RARE (*)    All other components within normal limits  PROTIME-INR - Abnormal; Notable for the following components:   Prothrombin Time 21.8 (*)    INR 1.9 (*)    All other components within normal limits  APTT - Abnormal; Notable for the following components:   aPTT 51 (*)    All other components within normal limits  HEPARIN LEVEL (UNFRACTIONATED) - Abnormal; Notable for the following components:   Heparin Unfractionated >2.20 (*)    All other components within normal limits  BASIC METABOLIC PANEL - Abnormal; Notable for the following components:   Potassium 3.0 (*)    Glucose, Bld 175 (*)    Calcium 8.4 (*)    All other components  within normal limits  CBC WITH DIFFERENTIAL/PLATELET - Abnormal; Notable for the following components:   WBC 18.5 (*)    RBC 4.02 (*)    Hemoglobin 10.5 (*)    HCT 33.2 (*)    RDW 15.9 (*)    Neutro Abs 16.1 (*)    Monocytes Absolute 1.4 (*)    Abs Immature Granulocytes 0.11 (*)    All other components within normal limits  GLUCOSE, CAPILLARY - Abnormal; Notable for the following components:   Glucose-Capillary 142 (*)    All other components within normal limits  SEDIMENTATION RATE - Abnormal; Notable for the following components:   Sed Rate 40 (*)    All other components within normal limits  C-REACTIVE PROTEIN - Abnormal; Notable for the following components:   CRP 30.6 (*)    All other components within normal limits  BRAIN NATRIURETIC PEPTIDE - Abnormal; Notable for the following components:   B Natriuretic Peptide 272.9 (*)  All other components within normal limits  APTT - Abnormal; Notable for the following components:   aPTT 49 (*)    All other components within normal limits  GLUCOSE, CAPILLARY - Abnormal; Notable for the following components:   Glucose-Capillary 200 (*)    All other components within normal limits  LACTATE DEHYDROGENASE, PLEURAL OR PERITONEAL FLUID - Abnormal; Notable for the following components:   LD, Fluid 223 (*)    All other components within normal limits  BODY FLUID CELL COUNT WITH DIFFERENTIAL - Abnormal; Notable for the following components:   Color, Fluid YELLOW (*)    Appearance, Fluid HAZY (*)    Neutrophil Count, Fluid 37 (*)    Monocyte-Macrophage-Serous Fluid 11 (*)    All other components within normal limits  GLUCOSE, CAPILLARY - Abnormal; Notable for the following components:   Glucose-Capillary 241 (*)    All other components within normal limits  GLUCOSE, CAPILLARY - Abnormal; Notable for the following components:   Glucose-Capillary 176 (*)    All other components within normal limits  CBG MONITORING, ED - Abnormal;  Notable for the following components:   Glucose-Capillary 162 (*)    All other components within normal limits  TROPONIN I (HIGH SENSITIVITY) - Abnormal; Notable for the following components:   Troponin I (High Sensitivity) 18 (*)    All other components within normal limits  CULTURE, BLOOD (ROUTINE X 2)  CULTURE, BLOOD (ROUTINE X 2)  RESPIRATORY PANEL BY RT PCR (FLU A&B, COVID)  MRSA PCR SCREENING  RESPIRATORY PANEL BY PCR  CULTURE, BODY FLUID-BOTTLE  GRAM STAIN  URINE CULTURE  EXPECTORATED SPUTUM ASSESSMENT W REFEX TO RESP CULTURE  ACID FAST CULTURE WITH REFLEXED SENSITIVITIES (MYCOBACTERIA)  ACID FAST SMEAR (AFB, MYCOBACTERIA)  LACTIC ACID, PLASMA  STREP PNEUMONIAE URINARY ANTIGEN  PROCALCITONIN  PROCALCITONIN  MAGNESIUM  PHOSPHORUS  ALBUMIN, PLEURAL OR PERITONEAL FLUID  PROTEIN, PLEURAL OR PERITONEAL FLUID  GLUCOSE, PLEURAL OR PERITONEAL FLUID  LEGIONELLA PNEUMOPHILA SEROGP 1 UR AG  PH, BODY FLUID  PROTIME-INR  PROCALCITONIN  RENAL FUNCTION PANEL  MAGNESIUM  CBC WITH DIFFERENTIAL/PLATELET  CBC  APTT  POC SARS CORONAVIRUS 2 AG -  ED  CYTOLOGY - NON PAP  TROPONIN I (HIGH SENSITIVITY)    EKG EKG Interpretation  Date/Time:  Sunday May 13 2019 19:41:15 EDT Ventricular Rate:  87 PR Interval:    QRS Duration: 98 QT Interval:  369 QTC Calculation: 444 R Axis:   50 Text Interpretation: Atrial fibrillation Anterior infarct, old Borderline repolarization abnormality No significant change since last tracing Confirmed by Blanchie Dessert [phone]) on 05/13/2019 7:42:40 PM Also confirmed by Blanchie Dessert [phone]), editor Hattie Perch ([id number])  on 05/14/2019 2:32:30 PM   Radiology DG Chest 1 View  Result Date: 05/14/2019 CLINICAL DATA:  Post left thoracentesis EXAM: CHEST  1 VIEW COMPARISON:  05/13/2019 FINDINGS: Decreased left pleural effusion post thoracentesis. No pneumothorax. Improved left lung aeration with residual left basilar atelectasis. Right lung  remains clear. Stable cardiomediastinal contours. IMPRESSION: Decreased left pleural effusion post thoracentesis. No pneumothorax. Residual left basilar atelectasis. Electronically Signed   By: Macy Mis M.D.   On: 05/14/2019 13:37   DG Chest Port 1 View  Result Date: 05/13/2019 CLINICAL DATA:  Shortness of breath and cough. EXAM: PORTABLE CHEST 1 VIEW COMPARISON:  September 08, 2017 FINDINGS: Multiple sternal wires and vascular clips are seen. Moderate severity atelectasis and/or infiltrate is seen within the left lung base. There is a large left pleural effusion. No pneumothorax is identified.  There is stable mild to moderate severity enlargement of the cardiac silhouette. There is marked severity calcification of the aortic arch. Multilevel degenerative changes seen throughout the thoracic spine. IMPRESSION: 1. Moderate severity left basilar atelectasis and/or infiltrate. 2. Large left pleural effusion. 3. Stable cardiomegaly with evidence of prior CABG. Electronically Signed   By: Virgina Norfolk M.D.   On: 05/13/2019 20:23   ECHOCARDIOGRAM COMPLETE  Result Date: 05/14/2019    ECHOCARDIOGRAM REPORT   Patient Name:   KINSLER SHRIDER Date of Exam: 05/14/2019 Medical Rec #:  [id number]       Height:       66.0 in Accession #:    [id number]      Weight:       197.3 lb Date of Birth:  Feb 23, 1929       BSA:          1.988 m Patient Age:    34 years        BP:           125/66 mmHg Patient Gender: M               HR:           96 bpm. Exam Location:  Inpatient Procedure: 2D Echo, Cardiac Doppler and Color Doppler Indications:    I35.0 Nonrheumatic aortic (valve) stenosis  History:        Patient has prior history of Echocardiogram examinations, most                 recent 10/10/2018. CHF, CAD, Abnormal ECG and Prior CABG, Stroke;                 Risk Factors:Hypertension and Diabetes. Edema. Post                 thoracentsis.  Sonographer:    Roseanna Rainbow RDCS Referring Phys: Shannon  Sonographer  Comments: Technically difficult study due to poor echo windows. IMPRESSIONS  1. Left ventricular ejection fraction, by estimation, is 60 to 65%. The left ventricle has normal function. The left ventricle has no regional wall motion abnormalities. There is moderate concentric left ventricular hypertrophy. Left ventricular diastolic parameters are indeterminate.  2. Right ventricular systolic function is normal. The right ventricular size is normal.  3. The mitral valve is normal in structure. No evidence of mitral valve regurgitation. No evidence of mitral stenosis.  4. The aortic valve is normal in structure. Aortic valve regurgitation is mild. Moderate aortic valve stenosis. Aortic valve area, by VTI measures 1.27 cm. Aortic valve mean gradient measures 19.3 mmHg. Aortic valve Vmax measures 2.75 m/s.  5. Compared with th echo 09/2018, ascending aorta has increased from 4.3 cm to 4.8cm. . Aortic dilatation noted. There is moderate dilatation of the ascending aorta measuring 48 mm.  6. The inferior vena cava is normal in size with greater than 50% respiratory variability, suggesting right atrial pressure of 3 mmHg. FINDINGS  Left Ventricle: Left ventricular ejection fraction, by estimation, is 60 to 65%. The left ventricle has normal function. The left ventricle has no regional wall motion abnormalities. The left ventricular internal cavity size was normal in size. There is  moderate concentric left ventricular hypertrophy. Left ventricular diastolic parameters are indeterminate. Right Ventricle: The right ventricular size is normal. No increase in right ventricular wall thickness. Right ventricular systolic function is normal. Left Atrium: Left atrial size was normal in size. Right Atrium: Right atrial size was normal in size. Pericardium:  There is no evidence of pericardial effusion. Mitral Valve: The mitral valve is normal in structure. Normal mobility of the mitral valve leaflets. No evidence of mitral valve  regurgitation. No evidence of mitral valve stenosis. Tricuspid Valve: The tricuspid valve is normal in structure. Tricuspid valve regurgitation is mild . No evidence of tricuspid stenosis. Aortic Valve: The aortic valve is normal in structure.. There is moderate thickening and moderate calcification of the aortic valve. Aortic valve regurgitation is mild. Moderate aortic stenosis is present. There is moderate thickening of the aortic valve. There is moderate calcification of the aortic valve. Aortic valve mean gradient measures 19.3 mmHg. Aortic valve peak gradient measures 30.3 mmHg. Aortic valve area, by VTI measures 1.27 cm. Pulmonic Valve: The pulmonic valve was normal in structure. Pulmonic valve regurgitation is not visualized. No evidence of pulmonic stenosis. Aorta: Compared with th echo 09/2018, ascending aorta has increased from 4.3 cm to 4.8cm. The aortic root is normal in size and structure and aortic dilatation noted. There is moderate dilatation of the ascending aorta measuring 48 mm. Venous: The inferior vena cava was not well visualized. The inferior vena cava is normal in size with greater than 50% respiratory variability, suggesting right atrial pressure of 3 mmHg. IAS/Shunts: No atrial level shunt detected by color flow Doppler.  LEFT VENTRICLE PLAX 2D LVIDd:         4.90 cm LVIDs:         2.60 cm LV PW:         1.30 cm LV IVS:        1.50 cm LVOT diam:     2.20 cm LV SV:         65 LV SV Index:   33 LVOT Area:     3.80 cm  LV Volumes (MOD) LV vol d, MOD A2C: 56.1 ml LV vol d, MOD A4C: 43.4 ml LV vol s, MOD A2C: 23.6 ml LV vol s, MOD A4C: 20.7 ml LV SV MOD A2C:     32.5 ml LV SV MOD A4C:     43.4 ml LV SV MOD BP:      26.7 ml RIGHT VENTRICLE         IVC TAPSE (M-mode): 1.3 cm  IVC diam: 2.50 cm LEFT ATRIUM         Index LA diam:    4.55 cm 2.29 cm/m  AORTIC VALVE AV Area (Vmax):    1.45 cm AV Area (Vmean):   1.14 cm AV Area (VTI):     1.27 cm AV Vmax:           275.33 cm/s AV Vmean:           204.667 cm/s AV VTI:            0.513 m AV Peak Grad:      30.3 mmHg AV Mean Grad:      19.3 mmHg LVOT Vmax:         105.00 cm/s LVOT Vmean:        61.500 cm/s LVOT VTI:          0.172 m LVOT/AV VTI ratio: 0.34  AORTA Ao Root diam: 3.90 cm Ao Asc diam:  4.80 cm MITRAL VALVE                TRICUSPID VALVE MV Area (PHT): 3.29 cm     TR Peak grad:   24.2 mmHg MV Decel Time: 231 msec     TR Vmax:  246.00 cm/s MV E velocity: 113.50 cm/s                             SHUNTS                             Systemic VTI:  0.17 m                             Systemic Diam: 2.20 cm Skeet Latch MD Electronically signed by Skeet Latch MD Signature Date/Time: 05/14/2019/6:52:07 PM    Final    IR THORACENTESIS ASP PLEURAL SPACE W/IMG GUIDE  Result Date: 05/14/2019 INDICATION: Patient with history of pneumonia, now with left pleural effusion. Request is made for diagnostic and therapeutic left thoracentesis. EXAM: ULTRASOUND GUIDED DIAGNOSTIC AND THERAPEUTIC LEFT THORACENTESIS MEDICATIONS: 10 mL 1% lidocaine COMPLICATIONS: None immediate. PROCEDURE: An ultrasound guided thoracentesis was thoroughly discussed with the patient and questions answered. The benefits, risks, alternatives and complications were also discussed. The patient understands and wishes to proceed with the procedure. Written consent was obtained. Ultrasound was performed to localize and mark an adequate pocket of fluid in the left chest. The area was then prepped and draped in the normal sterile fashion. 1% Lidocaine was used for local anesthesia. Under ultrasound guidance a 6 Fr Safe-T-Centesis catheter was introduced. Thoracentesis was performed. The catheter was removed and a dressing applied. FINDINGS: A total of approximately 900 mL of amber fluid was removed. Samples were sent to the laboratory as requested by the clinical team. IMPRESSION: Successful ultrasound guided diagnostic and therapeutic left thoracentesis yielding 900 mL of pleural  fluid. Read by: Brynda Greathouse [name] Electronically Signed   By: Lucrezia Europe M.D.   On: 05/14/2019 13:50    Procedures Procedures (including critical care time)  Medications Ordered in ED Medications  lactated ringers bolus 1,000 mL (has no administration in time range)  cefTRIAXone (ROCEPHIN) 2 g in sodium chloride 0.9 % 100 mL IVPB (has no administration in time range)  azithromycin (ZITHROMAX) 500 mg in sodium chloride 0.9 % 250 mL IVPB (has no administration in time range)    ED Course  I have reviewed the triage vital signs and the nursing notes.  Pertinent labs & imaging results that were available during my care of the patient were reviewed by me and considered in my medical decision making (see chart for details).    MDM Rules/Calculators/A&P                      Elderly male presenting today as a code sepsis with tachycardia, tachypnea, hypoxia and febrile to 101 prior to receiving 1 g of Tylenol.  Patient appears winded on exam and satting 90% on room air with labored breathing.  Rhonchi noted in the left lower lobe and also having pain in that side of his chest.  Concern for pneumonia.  Could be viral etiology but patient has had both Covid vaccines the last dose was at the beginning of February.  He has not had any sick contacts.  He has no abdominal pain or vomiting.  He has no evidence of fluid overload today.  Patient was placed on oxygen.  Sepsis labs were initiated.  Pt with leukocytosis of 20,000, lactate, UA and CMP without abnormality.  Pt CXR with concern for LLL PNA which is also where he  is having pain.  EKG with sinus tachy.  Pt improved after tylenol/fluid and O2.  Treated with CAP and admitted.  Final Clinical Impression(s) / ED Diagnoses Final diagnoses:  Pleural effusion  SOB (shortness of breath)  Status post thoracentesis    Rx / DC Orders ED Discharge Orders    None       Blanchie Dessert, MD 05/14/19 2141

## 2019-05-13 NOTE — ED Triage Notes (Signed)
Pt brought from home via EMS with 1wk history of cough, sore throat, pleuritic CP, and shortness of breath. Seen by both PCP and UC over the past week. Febrile on EMS arrival 101.0 and given 1gm tylenol PTA.

## 2019-05-13 NOTE — ED Notes (Signed)
Pt wife Webb Silversmith [phone]

## 2019-05-13 NOTE — ED Triage Notes (Signed)
Pt arrived via [facility] with complaint of SOB and Fever. Pt. Received positive test for covid. Pt 88% on room air and is currently on 4.5 L and is now 100%.[name]

## 2019-05-14 ENCOUNTER — Other Ambulatory Visit: Payer: Self-pay | Admitting: Family Medicine

## 2019-05-14 ENCOUNTER — Inpatient Hospital Stay (HOSPITAL_COMMUNITY): Payer: Medicare Other

## 2019-05-14 DIAGNOSIS — I350 Nonrheumatic aortic (valve) stenosis: Secondary | ICD-10-CM

## 2019-05-14 DIAGNOSIS — Z9861 Coronary angioplasty status: Secondary | ICD-10-CM

## 2019-05-14 DIAGNOSIS — I2510 Atherosclerotic heart disease of native coronary artery without angina pectoris: Secondary | ICD-10-CM

## 2019-05-14 DIAGNOSIS — I4891 Unspecified atrial fibrillation: Secondary | ICD-10-CM

## 2019-05-14 DIAGNOSIS — I10 Essential (primary) hypertension: Secondary | ICD-10-CM

## 2019-05-14 DIAGNOSIS — I5032 Chronic diastolic (congestive) heart failure: Secondary | ICD-10-CM

## 2019-05-14 DIAGNOSIS — J90 Pleural effusion, not elsewhere classified: Secondary | ICD-10-CM

## 2019-05-14 HISTORY — PX: IR THORACENTESIS ASP PLEURAL SPACE W/IMG GUIDE: IMG5380

## 2019-05-14 LAB — RESPIRATORY PANEL BY PCR

## 2019-05-14 LAB — CBC WITH DIFFERENTIAL/PLATELET
Abs Immature Granulocytes: 0.11 10*3/uL — ABNORMAL HIGH (ref 0.00–0.07)
Basophils Absolute: 0.1 10*3/uL (ref 0.0–0.1)
Basophils Relative: 0 %
Eosinophils Absolute: 0 10*3/uL (ref 0.0–0.5)
Eosinophils Relative: 0 %
HCT: 33.2 % — ABNORMAL LOW (ref 39.0–52.0)
Hemoglobin: 10.5 g/dL — ABNORMAL LOW (ref 13.0–17.0)
Immature Granulocytes: 1 %
Lymphocytes Relative: 4 %
Lymphs Abs: 0.7 10*3/uL (ref 0.7–4.0)
MCH: 26.1 pg (ref 26.0–34.0)
MCHC: 31.6 g/dL (ref 30.0–36.0)
MCV: 82.6 fL (ref 80.0–100.0)
Monocytes Absolute: 1.4 10*3/uL — ABNORMAL HIGH (ref 0.1–1.0)
Monocytes Relative: 8 %
Neutro Abs: 16.1 10*3/uL — ABNORMAL HIGH (ref 1.7–7.7)
Neutrophils Relative %: 87 %
Platelets: 367 10*3/uL (ref 150–400)
RBC: 4.02 MIL/uL — ABNORMAL LOW (ref 4.22–5.81)
RDW: 15.9 % — ABNORMAL HIGH (ref 11.5–15.5)
WBC: 18.5 10*3/uL — ABNORMAL HIGH (ref 4.0–10.5)
nRBC: 0 % (ref 0.0–0.2)

## 2019-05-14 LAB — GRAM STAIN

## 2019-05-14 LAB — GLUCOSE, CAPILLARY
Glucose-Capillary: 142 mg/dL — ABNORMAL HIGH (ref 70–99)
Glucose-Capillary: 200 mg/dL — ABNORMAL HIGH (ref 70–99)
Glucose-Capillary: 241 mg/dL — ABNORMAL HIGH (ref 70–99)
Glucose-Capillary: 176 mg/dL — ABNORMAL HIGH (ref 70–99)

## 2019-05-14 LAB — BASIC METABOLIC PANEL
Anion gap: 13 (ref 5–15)
BUN: 15 mg/dL (ref 8–23)
CO2: 23 mmol/L (ref 22–32)
Calcium: 8.4 mg/dL — ABNORMAL LOW (ref 8.9–10.3)
Chloride: 102 mmol/L (ref 98–111)
Creatinine, Ser: 1.05 mg/dL (ref 0.61–1.24)
GFR calc Af Amer: >60 mL/min (ref >60)
GFR calc non Af Amer: >60 mL/min (ref >60)
Glucose, Bld: 175 mg/dL — ABNORMAL HIGH (ref 70–99)
Potassium: 3 mmol/L — ABNORMAL LOW (ref 3.5–5.1)
Sodium: 138 mmol/L (ref 135–145)

## 2019-05-14 LAB — ECHOCARDIOGRAM COMPLETE
Height: 66 in
Weight: 3156.99 oz

## 2019-05-14 LAB — MAGNESIUM: Magnesium: 1.8 mg/dL (ref 1.7–2.4)

## 2019-05-14 LAB — ALBUMIN, PLEURAL OR PERITONEAL FLUID: Albumin, Fluid: 1.8 g/dL

## 2019-05-14 LAB — BODY FLUID CELL COUNT WITH DIFFERENTIAL
Eos, Fluid: 0 %
Lymphs, Fluid: 52 %
Monocyte-Macrophage-Serous Fluid: 11 % — ABNORMAL LOW (ref 50–90)
Neutrophil Count, Fluid: 37 % — ABNORMAL HIGH (ref 0–25)
Total Nucleated Cell Count, Fluid: 860 cu mm (ref 0–1000)

## 2019-05-14 LAB — APTT
aPTT: 51 seconds — ABNORMAL HIGH (ref 24–36)
aPTT: 49 seconds — ABNORMAL HIGH (ref 24–36)

## 2019-05-14 LAB — LACTATE DEHYDROGENASE, PLEURAL OR PERITONEAL FLUID: LD, Fluid: 223 U/L — ABNORMAL HIGH (ref 3–23)

## 2019-05-14 LAB — TROPONIN I (HIGH SENSITIVITY)
Troponin I (High Sensitivity): 18 ng/L — ABNORMAL HIGH (ref <18)
Troponin I (High Sensitivity): 16 ng/L (ref <18)

## 2019-05-14 LAB — GLUCOSE, PLEURAL OR PERITONEAL FLUID: Glucose, Fluid: 176 mg/dL

## 2019-05-14 LAB — PROTIME-INR
INR: 1.9 — ABNORMAL HIGH (ref 0.8–1.2)
Prothrombin Time: 21.8 seconds — ABNORMAL HIGH (ref 11.4–15.2)

## 2019-05-14 LAB — MRSA PCR SCREENING: MRSA by PCR: NEGATIVE

## 2019-05-14 LAB — HEPARIN LEVEL (UNFRACTIONATED): Heparin Unfractionated: >2.2 IU/mL — ABNORMAL HIGH (ref 0.30–0.70)

## 2019-05-14 LAB — PROCALCITONIN
Procalcitonin: 0.46 ng/mL
Procalcitonin: 0.52 ng/mL

## 2019-05-14 LAB — SEDIMENTATION RATE: Sed Rate: 40 mm/hr — ABNORMAL HIGH (ref 0–16)

## 2019-05-14 LAB — PROTEIN, PLEURAL OR PERITONEAL FLUID: Total protein, fluid: 3.4 g/dL

## 2019-05-14 LAB — PHOSPHORUS: Phosphorus: 3.5 mg/dL (ref 2.5–4.6)

## 2019-05-14 LAB — C-REACTIVE PROTEIN: CRP: 30.6 mg/dL — ABNORMAL HIGH (ref <1.0)

## 2019-05-14 LAB — BRAIN NATRIURETIC PEPTIDE: B Natriuretic Peptide: 272.9 pg/mL — ABNORMAL HIGH (ref 0.0–100.0)

## 2019-05-14 MED ORDER — METOPROLOL TARTRATE 25 MG PO TABS
25.0000 mg | ORAL_TABLET | Freq: Three times a day (TID) | ORAL | Status: DC
  Administered 2019-05-14: 25 mg via ORAL
  Filled 2019-05-14: qty 1

## 2019-05-14 MED ORDER — METOPROLOL TARTRATE 12.5 MG HALF TABLET
12.5000 mg | ORAL_TABLET | Freq: Four times a day (QID) | ORAL | Status: DC
  Administered 2019-05-14 (×2): 12.5 mg via ORAL
  Filled 2019-05-14 (×2): qty 1

## 2019-05-14 MED ORDER — SODIUM CHLORIDE 0.9% FLUSH
3.0000 mL | Freq: Two times a day (BID) | INTRAVENOUS | Status: DC
  Administered 2019-05-14 – 2019-05-17 (×9): 3 mL via INTRAVENOUS

## 2019-05-14 MED ORDER — LIDOCAINE HCL (PF) 1 % IJ SOLN
INTRAMUSCULAR | Status: DC | PRN
  Administered 2019-05-14: 5 mL

## 2019-05-14 MED ORDER — PHENOL 1.4 % MT LIQD
1.0000 | OROMUCOSAL | Status: DC | PRN
  Administered 2019-05-14: 1 via OROMUCOSAL

## 2019-05-14 MED ORDER — SODIUM CHLORIDE 0.9 % IV SOLN: 250.0000 mL | INTRAVENOUS | Status: DC | PRN

## 2019-05-14 MED ORDER — SODIUM CHLORIDE 0.9% FLUSH: 3.0000 mL | INTRAVENOUS | Status: DC | PRN

## 2019-05-14 MED ORDER — POTASSIUM CHLORIDE 10 MEQ/100ML IV SOLN
INTRAVENOUS | Status: AC
  Filled 2019-05-14: qty 100

## 2019-05-14 MED ORDER — MAGNESIUM SULFATE IN D5W 1-5 GM/100ML-% IV SOLN
1.0000 g | Freq: Once | INTRAVENOUS | Status: AC
  Administered 2019-05-14: 1 g via INTRAVENOUS
  Filled 2019-05-14: qty 100

## 2019-05-14 MED ORDER — ACETAMINOPHEN 650 MG RE SUPP: 650.0000 mg | Freq: Four times a day (QID) | RECTAL | Status: DC | PRN

## 2019-05-14 MED ORDER — POTASSIUM CHLORIDE 20 MEQ PO PACK
40.0000 meq | PACK | ORAL | Status: AC
  Administered 2019-05-14 (×2): 40 meq via ORAL
  Filled 2019-05-14 (×2): qty 2

## 2019-05-14 MED ORDER — HEPARIN (PORCINE) 25000 UT/250ML-% IV SOLN
1300.0000 [IU]/h | INTRAVENOUS | Status: DC
  Administered 2019-05-14: 1000 [IU]/h via INTRAVENOUS
  Administered 2019-05-15: 1300 [IU]/h via INTRAVENOUS
  Filled 2019-05-14 (×2): qty 250

## 2019-05-14 MED ORDER — LIDOCAINE HCL 1 % IJ SOLN
INTRAMUSCULAR | Status: AC
  Filled 2019-05-14: qty 20

## 2019-05-14 MED ORDER — SODIUM CHLORIDE 0.9% FLUSH
3.0000 mL | Freq: Two times a day (BID) | INTRAVENOUS | Status: DC
  Administered 2019-05-14 – 2019-05-17 (×7): 3 mL via INTRAVENOUS

## 2019-05-14 MED ORDER — POTASSIUM CHLORIDE 10 MEQ/100ML IV SOLN: 10.0000 meq | INTRAVENOUS | Status: DC

## 2019-05-14 MED ORDER — VANCOMYCIN HCL 1250 MG/250ML IV SOLN
1250.0000 mg | INTRAVENOUS | Status: DC
  Administered 2019-05-14 – 2019-05-15 (×2): 1250 mg via INTRAVENOUS
  Filled 2019-05-14 (×2): qty 250

## 2019-05-14 MED ORDER — ACETAMINOPHEN 325 MG PO TABS
650.0000 mg | ORAL_TABLET | Freq: Four times a day (QID) | ORAL | Status: DC | PRN
  Administered 2019-05-15 – 2019-05-16 (×2): 650 mg via ORAL
  Filled 2019-05-14 (×2): qty 2

## 2019-05-14 NOTE — Progress Notes (Signed)
Pharmacy Antibiotic Note  [name] is a 86 y.o. male admitted on 05/13/2019 with pneumonia.  SCr 1.05  cx neg  Has been on ceftriaxone azithro  Plan: Vanc 1250 mg q24h - est auc 475 Monitor cx renal fx vanc lvls prn  Height: 5\' 6"  (167.6 cm) Weight: 197 lb 5 oz (89.5 kg) IBW/kg (Calculated) : 63.8  Temp (24hrs), Avg:98.3 F (36.8 C), Min:97.7 F (36.5 C), Max:99.2 F (37.3 C)  Recent Labs  Lab 05/13/19 1950 05/13/19 1951 05/14/19 0255  WBC 20.4*  --  18.5*  CREATININE 1.14  --  1.05  LATICACIDVEN  --  1.3  --     Estimated Creatinine Clearance: 51 mL/min (by C-G formula based on SCr of 1.05 mg/dL).    Allergies  Allergen Reactions  . Erythromycin Base   . Cefadroxil Other (See Comments)    Unknown Unknown  . Ciprofloxacin Other (See Comments)    Unknown Unknown  . Erythromycin Other (See Comments)    Upset stomach Upset stomach   Barth Kirks, PharmD, BCPS, BCCCP Clinical Pharmacist [phone]  Please check AMION for all [facility] numbers  05/14/2019 8:27 AM

## 2019-05-14 NOTE — Consult Note (Addendum)
Cardiology Consultation:   Patient ID: [name] MRN: [id number]; DOB: 1928-08-12  Admit date: 05/13/2019 Date of Consult: 05/14/2019  Primary Care Provider: [name], MD Primary Cardiologist: Peter Martinique, MD  Primary Electrophysiologist:  None    Patient Profile:   [name] is a 86 y.o. male with a hx of persistent atrial fibrillation on Eliquis, CAD s/p CABG in 1990 and DES to RCA in 2006, CVA in April [id number], chronic diastolic HF ([id number]), moderate AS, remote tobacco use, HTN, DM2, HLD, and right carotid stenosis 60% who is being seen today for the evaluation of Afib RVR at the request of Dr. Marthenia Rolling.  History of Present Illness:   [name] is followed by Dr. Martinique. He has a h/o of persistent Afib on Eliquis, rate controlled on Toprol XL and Cardizem. He has CAD s/p CABG 1990 with repeat cardiac catheterization in 2006 revealing occluded prox LAD and critical stenosis of the RCA, occluded SVG-diagonal and patent LIMA to LAD treated with DES to RCA. Echo in 09/2018 showed EF 55-60%, noted to have tricuspid aortic valve with severe thickening and calcification of the aortic valve, trivial AR, moderate stenosis, AV mean gradient 13.0 mmHg, mild to mod mitral annular calcification with no stenosis. Patient was last seen 01/15/20 by Dr. Martinique. He had previously been started on lasix for lower leg edema and noted 11 lb weight loss at the visit. No changes were made.   The patient presented to the ED 05/13/19 for shortness of breath. He reported sore throat and congestion that started about 1 week ago. He went to his PCPs who recommended gargling solution. HE went back when it didn't get better and was given and nasal spray. The next day he went to the Urgent care and was given another nasal spray. The next day ( yesterday) he started feeling horrible. He had severe sob, pleuritic chest pain, fatigue, cough, sore throat and was feeling hot. He was unable to eat and all he  wanted to do was sleep. EMS was called who found him to be febrile to 101 and given 1g Tylenol and started him on 2L O2 for hypoxia.   In the ED BP 128/69. Pulse 111, 99 degrees F, RR 30. Code sepsis was initiated. Labs showed potassium 3.0, glucose 175, creatinine 1.05, WBC 18.5, Hgb 10.5. BNP 272. EKG showed Afib 87bpm.  HS troponin 18>16.  CXR showed moderate left basilar atelectasis or infiltrate, large left pleural effusion. He was given IVF bolus, rocephin, and azythromycin and admitted.    Past Medical History:  Diagnosis Date  . Abdominal pain   . Acute renal failure ([place])     resolved  . Arthritis    "hands & legs" (11/'10/2014)  . Ataxia   . Atrial fibrillation ([place])   . Bladder outlet obstruction   . Bladder outlet obstruction   . BPH (benign prostatic hyperplasia)   . CAD (coronary artery disease)    a. CABG IN 1989. b. 01/08/2015 CTO of ost LAD, LIMA to LAD not visualized but assumed patent given myoview finding, occluded SVG to diagonal, 99% mid RCA tx w/ SYNERGY DES 3X28 mm  . Constipation   . Diabetes mellitus type 2, insulin dependent ([place])   . Epistaxis, recurrent Feb 2017  . GERD (gastroesophageal reflux disease)   . HTN (hypertension)   . Hx of bacterial pneumonia   . Hyperlipemia   . Kidney stones   . Mild aortic stenosis   . Pneumonia 04/2019  .  Rib fractures    left rib fractures being treated with pain medications  . Stented coronary artery Nov 2016   RCA DES  . Urinary tract infection     Enterococcus    Past Surgical History:  Procedure Laterality Date  . CARDIAC CATHETERIZATION  1989  . CARDIAC CATHETERIZATION N/A 01/08/2015   Procedure: Left Heart Cath and Cors/Grafts Angiography;  Surgeon: Peter M Martinique, MD;  Location: [facility];  Service: Cardiovascular;  Laterality: N/A;  . CARDIAC CATHETERIZATION  01/08/2015   Procedure: Coronary Stent Intervention;  Surgeon: Peter M Martinique, MD;  Location: [facility];  Service: Cardiovascular;;   . CARPAL TUNNEL RELEASE Right 08/2010  . CATARACT EXTRACTION W/ INTRAOCULAR LENS  IMPLANT, BILATERAL Bilateral   . CORONARY ANGIOPLASTY  01/08/15   RCA DES  . CORONARY ARTERY BYPASS GRAFT  1989   "CABG X 2"  . IR THORACENTESIS ASP PLEURAL SPACE W/IMG GUIDE  05/14/2019  . JOINT REPLACEMENT    . TONSILLECTOMY    . TOTAL HIP ARTHROPLASTY Right 2000     Home Medications:  Prior to Admission medications   Medication Sig Start Date End Date Taking? Authorizing Provider  apixaban (ELIQUIS) 5 MG TABS tablet Take 1 tablet (5 mg total) by mouth 2 (two) times daily. 06/08/16  Yes Reyne Dumas, MD  atorvastatin (LIPITOR) 40 MG tablet Take 1 tablet (40 mg total) by mouth daily at 6 PM. 06/08/16  Yes Reyne Dumas, MD  AVODART 0.5 MG capsule Take 0.5 mg by mouth every Monday, Wednesday, and Friday.  07/20/11  Yes [provider]  azelastine (ASTELIN) 0.1 % nasal spray Place 1 spray into both nostrils 2 (two) times daily. Use in each nostril as directed 05/12/19  Yes Robyn Haber, MD  diltiazem (CARDIZEM CD) 360 MG 24 hr capsule Take 1 capsule (360 mg total) by mouth daily. 01/10/15  Yes Martinique, Peter M, MD  empagliflozin (JARDIANCE) 10 MG TABS tablet Take 5 mg by mouth daily.    Yes [provider]  furosemide (LASIX) 40 MG tablet Take 40 mg by mouth daily.    Yes [provider]  gabapentin (NEURONTIN) 300 MG capsule Take 1 capsule (300 mg total) by mouth 3 (three) times daily. 11/08/18  Yes [name], MD  insulin glargine (LANTUS) 100 UNIT/ML injection Inject 38 Units into the skin every morning.    Yes [provider]  metFORMIN (GLUCOPHAGE) 500 MG tablet Take 500 mg by mouth 2 (two) times daily with a meal.   Yes [provider]  metoprolol succinate (TOPROL-XL) 100 MG 24 hr tablet TAKE 1 TABLET BY MOUTH TWICE DAILY Patient taking differently: Take 100 mg by mouth 2 (two) times daily.  05/14/19  Yes [name], MD  nitroGLYCERIN (NITROSTAT) 0.4 MG  SL tablet Place 0.4 mg under the tongue every 5 (five) minutes as needed for chest pain (x 3 doses).   Yes [provider]  pantoprazole (PROTONIX) 40 MG tablet TAKE 1 TABLET BY MOUTH DAILY AT NOON Patient taking differently: Take 40 mg by mouth daily with lunch.  09/18/18  Yes Martinique, Peter M, MD  potassium chloride SA (KLOR-CON) 20 MEQ tablet Take 20 mEq by mouth daily. 04/18/19  Yes [provider]  Tamsulosin HCl (FLOMAX) 0.4 MG CAPS Take 0.4 mg by mouth every Tuesday, Thursday, Saturday, and Sunday.    Yes [provider]  benzonatate (TESSALON) 100 MG capsule Take 1-2 capsules (100-200 mg total) by mouth 2 (  two) times daily as needed for cough. Patient not taking: Reported on 05/14/2019 05/12/19   Robyn Haber, MD  fluticasone [facility]) 50 MCG/ACT nasal spray Place 1 spray into both nostrils daily. Patient not taking: Reported on 05/14/2019 04/25/19   [name], MD  oxyCODONE-acetaminophen (PERCOCET) 10-325 MG tablet Take 1 tablet by mouth 2 (two) times daily as needed for pain. Patient not taking: Reported on 05/14/2019 02/19/19   [name], MD  PRECISION XTRA TEST STRIPS test strip USE TO TEST BLOOD SUGAR 1 TO 2 TIMES DAILY 05/10/19   [name], MD    Inpatient Medications: Scheduled Meds: . diltiazem  360 mg Oral Daily  . insulin aspart  0-5 Units Subcutaneous QHS  . insulin aspart  0-9 Units Subcutaneous TID WC  . insulin glargine  20 Units Subcutaneous Daily  . lidocaine      . metoprolol tartrate  12.5 mg Oral Q6H  . sodium chloride flush  3 mL Intravenous Q12H  . sodium chloride flush  3 mL Intravenous Q12H   Continuous Infusions: . sodium chloride    . azithromycin    . cefTRIAXone (ROCEPHIN)  IV    . heparin 1,150 Units/hr (05/14/19 1206)  . magnesium sulfate bolus IVPB 1 g (05/14/19 1503)  . vancomycin 1,250 mg (05/14/19 1024)   PRN Meds: sodium chloride, acetaminophen **OR** acetaminophen, guaiFENesin-dextromethorphan,  lidocaine (PF), ondansetron **OR** ondansetron (ZOFRAN) IV, oxyCODONE-acetaminophen **AND** oxyCODONE, phenol, sodium chloride flush  Allergies:    Allergies  Allergen Reactions  . Erythromycin Base   . Cefadroxil Other (See Comments)    Unknown Unknown  . Ciprofloxacin Other (See Comments)    Unknown Unknown  . Erythromycin Other (See Comments)    Upset stomach Upset stomach    Social History:   Social History   Socioeconomic History  . Marital status: Married    Spouse name: Not on file  . Number of children: 3  . Years of education: Not on file  . Highest education level: Not on file  Occupational History  . Occupation: Agricultural consultant  Tobacco Use  . Smoking status: Former Smoker    Packs/day: 3.00    Years: 10.00    Pack years: 30.00    Types: Cigarettes    Quit date: 05/11/1958    Years since quitting: 61.0  . Smokeless tobacco: Never Used  Substance and Sexual Activity  . Alcohol use: No  . Drug use: No  . Sexual activity: Not Currently  Other Topics Concern  . Not on file  Social History Narrative   Previously lived in [place], [place] currently living in [place], [place] Strain:   . Difficulty of Paying Living Expenses:   Food Insecurity:   . Worried About Charity fundraiser in the Last Year:   . Arboriculturist in the Last Year:   Transportation Needs: Unmet Transportation Needs  . Lack of Transportation (Medical): Yes  . Lack of Transportation (Non-Medical): Yes  Physical Activity:   . Days of Exercise per Week:   . Minutes of Exercise per Session:   Stress:   . Feeling of Stress :   Social Connections:   . Frequency of Communication with Friends and Family:   . Frequency of Social Gatherings with Friends and Family:   . Attends Religious Services:   . Active Member of Clubs or Organizations:   . Attends Archivist  Meetings:   [name] Marital Status:   Intimate Partner  Violence:   . Fear of Current or Ex-Partner:   . Emotionally Abused:   [name] Physically Abused:   . Sexually Abused:     Family History:   Family History  Problem Relation Age of Onset  . Brain cancer Father   . Throat cancer Brother      ROS:  Please see the history of present illness.  All other ROS reviewed and negative.     Physical Exam/Data:   Vitals:   05/13/19 2345 05/14/19 0000 05/14/19 0050 05/14/19 1034  BP: 136/71 (!) 123/59 129/82 125/66  Pulse: 94 86 (!) 121 96  Resp: (!) 29 (!) 21 20 20   Temp:   98.3 F (36.8 C) 98.3 F (36.8 C)  TempSrc:   Oral Oral  SpO2: 94% (!) 85% 94% 100%  Weight:   89.5 kg   Height:   5\' 6"  (1.676 m)     Intake/Output Summary (Last 24 hours) at 05/14/2019 1541 Last data filed at 05/14/2019 1503 Gross per 24 hour  Intake 2022.33 ml  Output 2 ml  Net 2020.33 ml   Last 3 Weights 05/14/2019 05/13/2019 05/13/2019  Weight (lbs) 197 lb 5 oz 188 lb 176 lb  Weight (kg) 89.5 kg 85.276 kg 79.833 kg     Body mass index is 31.85 kg/m.  General:  Well nourished, well developed, in no acute distress HEENT: normal Lymph: no adenopathy Neck: no JVD Endocrine:  No thryomegaly Vascular: No carotid bruits; FA pulses 2+ bilaterally without bruits  Cardiac:  normal S1, S2; Irreg Irreg, tachycardia; systolic murmur  Lungs:  Diminished on the left side, 4L O2 Abd: soft, nontender, no hepatomegaly  Ext: no edema Musculoskeletal:  No deformities, BUE and BLE strength normal and equal Skin: warm and dry  Neuro:  CNs 2-12 intact, no focal abnormalities noted Psych:  Normal affect   EKG:  The EKG was personally reviewed and demonstrates:  Afib, 87 bpm, no significant change Telemetry:  Telemetry was personally reviewed and demonstrates:  Afib, rates 100-120, peak elevation 150, rare PVCs, 14 beats NSVT  Relevant CV Studies:  Echo 05/14/19 Pending  Echo 10/10/19 1. The left ventricle has normal systolic function, with an ejection  fraction of  55-60%. The cavity size was normal. Left ventricular diastolic  function could not be evaluated secondary to atrial fibrillation. Elevated  left ventricular end-diastolic  pressure No evidence of left ventricular regional wall motion  abnormalities.  2. The right ventricle has normal systolic function. The cavity was  mildly enlarged. There is no increase in right ventricular wall thickness.  3. Left atrial size was severely dilated.  4. The aortic valve is tricuspid. Severely thickening of the aortic  valve. Severe calcifcation of the aortic valve. Aortic valve regurgitation  is trivial by color flow Doppler. Moderate stenosis of the aortic valve.  Moderate aortic annular calcification  noted. AV Vmax: 232.40 cm and AV Mean Grad: 13.0 mmHg are consistent with  mild AS but visually the AV appears to be at least moderately stenosis.  5. There is mild to moderate mitral annular calcification present. No  evidence of mitral valve stenosis  6. There is mild dilatation of the ascending aorta measuring 43 mm.    Laboratory Data:  High Sensitivity Troponin:   Recent Labs  Lab 05/14/19 0936 05/14/19 1104  TROPONINIHS 18* 16     Chemistry Recent Labs  Lab 05/13/19 1950 05/14/19 0255  NA 136  138  K 3.5 3.0*  CL 100 102  CO2 22 23  GLUCOSE 245* 175*  BUN 16 15  CREATININE 1.14 1.05  CALCIUM 8.6* 8.4*  GFRNONAA 57* >60  GFRAA >60 >60  ANIONGAP 14 13    Recent Labs  Lab 05/13/19 1950  PROT 5.7*  ALBUMIN 2.7*  AST 19  ALT 15  ALKPHOS 75  BILITOT 1.2   Hematology Recent Labs  Lab 05/13/19 1950 05/14/19 0255  WBC 20.4* 18.5*  RBC 4.51 4.02*  HGB 11.7* 10.5*  HCT 37.5* 33.2*  MCV 83.1 82.6  MCH 25.9* 26.1  MCHC 31.2 31.6  RDW 15.8* 15.9*  PLT 391 367   BNP Recent Labs  Lab 05/14/19 0936  BNP 272.9*    DDimer No results for input(s): DDIMER in the last 168 hours.   Radiology/Studies:  DG Chest 1 View  Result Date: 05/14/2019 CLINICAL DATA:  Post  left thoracentesis EXAM: CHEST  1 VIEW COMPARISON:  05/13/2019 FINDINGS: Decreased left pleural effusion post thoracentesis. No pneumothorax. Improved left lung aeration with residual left basilar atelectasis. Right lung remains clear. Stable cardiomediastinal contours. IMPRESSION: Decreased left pleural effusion post thoracentesis. No pneumothorax. Residual left basilar atelectasis. Electronically Signed   By: Macy Mis M.D.   On: 05/14/2019 13:37   DG Chest Port 1 View  Result Date: 05/13/2019 CLINICAL DATA:  Shortness of breath and cough. EXAM: PORTABLE CHEST 1 VIEW COMPARISON:  September 08, 2017 FINDINGS: Multiple sternal wires and vascular clips are seen. Moderate severity atelectasis and/or infiltrate is seen within the left lung base. There is a large left pleural effusion. No pneumothorax is identified. There is stable mild to moderate severity enlargement of the cardiac silhouette. There is marked severity calcification of the aortic arch. Multilevel degenerative changes seen throughout the thoracic spine. IMPRESSION: 1. Moderate severity left basilar atelectasis and/or infiltrate. 2. Large left pleural effusion. 3. Stable cardiomegaly with evidence of prior CABG. Electronically Signed   By: Virgina Norfolk M.D.   On: 05/13/2019 20:23   IR THORACENTESIS ASP PLEURAL SPACE W/IMG GUIDE  Result Date: 05/14/2019 INDICATION: Patient with history of pneumonia, now with left pleural effusion. Request is made for diagnostic and therapeutic left thoracentesis. EXAM: ULTRASOUND GUIDED DIAGNOSTIC AND THERAPEUTIC LEFT THORACENTESIS MEDICATIONS: 10 mL 1% lidocaine COMPLICATIONS: None immediate. PROCEDURE: An ultrasound guided thoracentesis was thoroughly discussed with the patient and questions answered. The benefits, risks, alternatives and complications were also discussed. The patient understands and wishes to proceed with the procedure. Written consent was obtained. Ultrasound was performed to localize and  mark an adequate pocket of fluid in the left chest. The area was then prepped and draped in the normal sterile fashion. 1% Lidocaine was used for local anesthesia. Under ultrasound guidance a 6 Fr Safe-T-Centesis catheter was introduced. Thoracentesis was performed. The catheter was removed and a dressing applied. FINDINGS: A total of approximately 900 mL of amber fluid was removed. Samples were sent to the laboratory as requested by the clinical team. IMPRESSION: Successful ultrasound guided diagnostic and therapeutic left thoracentesis yielding 900 mL of pleural fluid. Read by: Brynda Greathouse [name] Electronically Signed   By: Lucrezia Europe M.D.   On: 05/14/2019 13:50    Assessment and Plan:   SEPSIS/Pneumonia/large left pleural effusion Presented with 1 week of progressive sob, sore throat, cough, fatigue found to have fever, tachycardia, and leukocytosis. CXR with LLL PNA and large pleural effusion started on supplemental O2, abx, given IVF bolus. Eliquis held and IV heparin started for  possible procedure. - Blood cultures drawn - IR performed thoracentesis which yielded 95mL amber fluid.  - Nasal swab positive for MRSA - IV Abx per IM  Persistent Afib, now Afib RVR - known h/o of Afib - On eliquis for stroke ppx>>held for thoracentesis and covered with IV heparin. Can re-start Eliquis if no further procedures are planned.  - Prior to admission was rate controlled on Toprol XL 100 mg daily and Cardizem 360mg  daily as OP.  - Cardizem continued on admission. BB changed to Lopressor 12.5mg  BID for hypotension - Rates have been elevated in the setting of SEPSIS/PNA, 100-120 bpm. - Consider increasing BB for better rate control. Suspect rates will improve with treatment of acute illness. Will increase to 25mg  TID. - OK to start Eliquis  CAD s/p CABG in 1990 and DES ro mid RCA in 2006 - patient describes pleuritic chest pain - HS troponin 18>16, not consistent with ACS - no further work-up at this  time  Chronic diastolic CHF - EF in August was 55-60% - Repeat echo pending - Diuretic held initially and given IVF in the ED - takes Lasix 40 mg daily at baseline. Appears euvolemic on exam  HTN - pressures reasonable - continue BB and Cardizem  Mild to mod AS - repeat echo pending  DM2 - SSI per IM  HLD - Atrovastatin 40 mg daily  For questions or updates, please contact [facility] Please consult [email or web address] for contact info under     Signed, Jett Fukuda Ninfa Meeker, [name]  05/14/2019 3:41 PM

## 2019-05-14 NOTE — Procedures (Signed)
PROCEDURE SUMMARY:  Successful US guided diagnostic and therapeutic left thoracentesis. Yielded 900 mL of amber fluid. Pt tolerated procedure well. No immediate complications.  Specimen was sent for labs. CXR ordered.  EBL < 5 mL  Docia Barrier [name] 05/14/2019 1:49 PM

## 2019-05-14 NOTE — Progress Notes (Addendum)
PROGRESS NOTE    Trennis Jude  [id number] DOB: 1928-08-16 DOA: 05/13/2019 PCP: [name], MD  Outpatient Specialists:   Brief Narrative:  Patient is an 86 year old male with past medical history significant for coronary artery disease status post PCI and CABG, mild aortic stenosis, nephrolithiasis, hyperlipidemia, hypertension, diabetes mellitus, BPH, chronic atrial fibrillation on Eliquis.  Patient presented with a 1 week history of viral syndrome.  According to the patient, the problem started with sore throat about a week ago.  Subsequently, patient developed runny nose, chest congestion and nonproductive cough with associated chest pain (only on coughing).  No fever or chills.  On presentation to the hospital, significant leukocytosis was noted, and chest x-ray was said to reveal a left lower lobe infiltrate with significant left-sided pleural effusion.  Work-up and management of pneumonia is in progress.  Patient is on heparin for now.  Plan is for thoracentesis and pleural fluid analysis.  Further management will depend on hospital course.   Assessment & Plan:   Principal Problem:   Left lower lobe pneumonia Active Problems:   Essential (primary) hypertension   Diabetes mellitus type 2, insulin dependent (HCC)   Chronic atrial fibrillation (HCC)   CAD S/P PCI- Nov 2016   Chronic diastolic CHF (congestive heart failure) (HCC)   Pleural effusion, left   1. Pneumonia; large left pleural effusion  - Presented with acute viral syndrome, significant leukocytosis and shortness of breath. -Chest x-ray is interpreted as revealed lung left lower lobe infiltrate with significant left-sided pleural effusion. -Continue IV ceftriaxone and azithromycin. -Add IV vancomycin (considering possibility of post viral pneumonia) -Nasal swab for MRSA.   -Respiratory virus panel  -Swallowing evaluation  -Consult interventional radiology for thoracentesis and pleural fluid analysis.   -Follow-up on the pending cultures.  2. Atrial fibrillation  - Rate-controlled  -Continue anticoagulation.   3. CAD  - No typical chest pain.   -Only experiences chest pain while coughing.   -Troponin as per protocol  4. Chronic diastolic CHF  - Appears compensated  - Check cardiac BNP.   5. Insulin-dependent DM  - A1c was 8.5% in December 2020  - Continue CBG checks and insulin    6.  History of mild to moderate aortic stenosis and mild dilatation of the ascending aorta: -Repeat echocardiogram -Further management depend on hospital course  7.  Hypokalemia: -Monitor and replace. -Check magnesium level.  DVT prophylaxis: Patient is currently on heparin drip.  Patient was on Eliquis prior to admission. Code Status: Full Family Communication:  Disposition Plan: This will depend on hospital course   Consultants:   Interventional radiology for thoracentesis  Procedures:   Awaiting thoracentesis  Antimicrobials:   IV Rocephin  IV azithromycin  IV vancomycin   Subjective: No fever or chills Shortness of breath persists. Patient continues to have cough  Objective: Vitals:   05/13/19 2245 05/13/19 2345 05/14/19 0000 05/14/19 0050  BP: (!) 107/57 136/71 (!) 123/59 129/82  Pulse: 98 94 86 (!) 121  Resp: (!) 22 (!) 29 (!) 21 20  Temp:    98.3 F (36.8 C)  TempSrc:    Oral  SpO2: 97% 94% (!) 85% 94%  Weight:    89.5 kg  Height:    5\' 6"  (1.676 m)    Intake/Output Summary (Last 24 hours) at 05/14/2019 0915 Last data filed at 05/14/2019 0730 Gross per 24 hour  Intake 1768.81 ml  Output --  Net 1768.81 ml   Filed Weights   05/13/19 1936  05/13/19 2050 05/14/19 0050  Weight: 79.8 kg 85.3 kg 89.5 kg    Examination:  General exam: Appears calm and in mild respiratory distress.    Respiratory system: Decreased air entry, worse on the left side.   Cardiovascular system: S1 & S2 Gastrointestinal system: Abdomen is obese, soft and nontender. No  organomegaly or masses felt. Normal bowel sounds heard. Central nervous system: Awake and alert.  Patient moves all extremities.   Extremities: No leg edema  Data Reviewed: I have personally reviewed following labs and imaging studies  CBC: Recent Labs  Lab 05/13/19 1950 05/14/19 0255  WBC 20.4* 18.5*  NEUTROABS 17.0* 16.1*  HGB 11.7* 10.5*  HCT 37.5* 33.2*  MCV 83.1 82.6  PLT 391 [id number]   Basic Metabolic Panel: Recent Labs  Lab 05/13/19 1950 05/14/19 0255  NA 136 138  K 3.5 3.0*  CL 100 102  CO2 22 23  GLUCOSE 245* 175*  BUN 16 15  CREATININE 1.14 1.05  CALCIUM 8.6* 8.4*   GFR: Estimated Creatinine Clearance: 51 mL/min (by C-G formula based on SCr of 1.05 mg/dL). Liver Function Tests: Recent Labs  Lab 05/13/19 1950  AST 19  ALT 15  ALKPHOS 75  BILITOT 1.2  PROT 5.7*  ALBUMIN 2.7*   No results for input(s): LIPASE, AMYLASE in the last 168 hours. No results for input(s): AMMONIA in the last 168 hours. Coagulation Profile: Recent Labs  Lab 05/13/19 1950 05/14/19 0255  INR 2.1* 1.9*   Cardiac Enzymes: No results for input(s): CKTOTAL, CKMB, CKMBINDEX, TROPONINI in the last 168 hours. BNP (last 3 results) No results for input(s): PROBNP in the last 8760 hours. HbA1C: No results for input(s): HGBA1C in the last 72 hours. CBG: Recent Labs  Lab 05/13/19 2235 05/14/19 0745  GLUCAP 162* 142*   Lipid Profile: No results for input(s): CHOL, HDL, LDLCALC, TRIG, CHOLHDL, LDLDIRECT in the last 72 hours. Thyroid Function Tests: No results for input(s): TSH, T4TOTAL, FREET4, T3FREE, THYROIDAB in the last 72 hours. Anemia Panel: No results for input(s): VITAMINB12, FOLATE, FERRITIN, TIBC, IRON, RETICCTPCT in the last 72 hours. Urine analysis:    Component Value Date/Time   COLORURINE YELLOW 05/13/2019 2048   APPEARANCEUR CLEAR 05/13/2019 2048   LABSPEC 1.027 05/13/2019 2048   PHURINE 5.0 05/13/2019 2048   GLUCOSEU >=500 (A) 05/13/2019 2048   HGBUR MODERATE  (A) 05/13/2019 2048   BILIRUBINUR NEGATIVE 05/13/2019 2048   BILIRUBINUR Negative 03/24/2018 0841   KETONESUR NEGATIVE 05/13/2019 2048   PROTEINUR NEGATIVE 05/13/2019 2048   UROBILINOGEN 0.2 03/24/2018 0841   UROBILINOGEN 0.2 11/28/2010 1156   NITRITE NEGATIVE 05/13/2019 2048   LEUKOCYTESUR NEGATIVE 05/13/2019 2048   Sepsis Labs: @LABRCNTIP (procalcitonin:4,lacticidven:4)  ) Recent Results (from the past 240 hour(s))  Blood Culture (routine x 2)     Status: None (Preliminary result)   Collection Time: 05/13/19  7:51 PM   Specimen: BLOOD LEFT FOREARM  Result Value Ref Range Status   Specimen Description BLOOD LEFT FOREARM  Final   Special Requests   Final    BOTTLES DRAWN AEROBIC AND ANAEROBIC Blood Culture adequate volume Performed at [facility], [place] [address] [place], [place] [address]    Culture NO GROWTH < 12 HOURS  Final   Report Status PENDING  Incomplete  Blood Culture (routine x 2)     Status: None (Preliminary result)   Collection Time: 05/13/19  9:25 PM   Specimen: BLOOD  Result Value Ref Range Status   Specimen Description BLOOD  RIGHT ARM  Final   Special Requests   Final    BOTTLES DRAWN AEROBIC AND ANAEROBIC Blood Culture adequate volume   Culture NO GROWTH < 12 HOURS  Final   Report Status PENDING  Incomplete  Respiratory Panel by RT PCR (Flu A&B, Covid) - Nasopharyngeal Swab     Status: None   Collection Time: 05/13/19  9:39 PM   Specimen: Nasopharyngeal Swab  Result Value Ref Range Status   SARS Coronavirus 2 by RT PCR NEGATIVE NEGATIVE Final    Comment: (NOTE) SARS-CoV-2 target nucleic acids are NOT DETECTED. The SARS-CoV-2 RNA is generally detectable in upper respiratoy specimens during the acute phase of infection. The lowest concentration of SARS-CoV-2 viral copies this assay can detect is 131 copies/mL. A negative result does not preclude SARS-Cov-2 infection and should not be used as the sole basis for treatment or other patient management  decisions. A negative result may occur with  improper specimen collection/handling, submission of specimen other than nasopharyngeal swab, presence of viral mutation(s) within the areas targeted by this assay, and inadequate number of viral copies (<131 copies/mL). A negative result must be combined with clinical observations, patient history, and epidemiological information. The expected result is Negative. Fact Sheet for Patients:  PinkCheek.be Fact Sheet for Healthcare Providers:  GravelBags.it This test is not yet ap proved or cleared by the [place] FDA and  has been authorized for detection and/or diagnosis of SARS-CoV-2 by FDA under an Emergency Use Authorization (EUA). This EUA will remain  in effect (meaning this test can be used) for the duration of the COVID-19 declaration under Section 564(b)(1) of the Act, 21 U.S.C. section 360bbb-3(b)(1), unless the authorization is terminated or revoked sooner.    Influenza A by PCR NEGATIVE NEGATIVE Final   Influenza B by PCR NEGATIVE NEGATIVE Final    Comment: (NOTE) The Xpert Xpress SARS-CoV-2/FLU/RSV assay is intended as an aid in  the diagnosis of influenza from Nasopharyngeal swab specimens and  should not be used as a sole basis for treatment. Nasal washings and  aspirates are unacceptable for Xpert Xpress SARS-CoV-2/FLU/RSV  testing. Fact Sheet for Patients: PinkCheek.be Fact Sheet for Healthcare Providers: GravelBags.it This test is not yet approved or cleared by the [place] FDA and  has been authorized for detection and/or diagnosis of SARS-CoV-2 by  FDA under an Emergency Use Authorization (EUA). This EUA will remain  in effect (meaning this test can be used) for the duration of the  Covid-19 declaration under Section 564(b)(1) of the Act, 21  U.S.C. section 360bbb-3(b)(1), unless the authorization  is  terminated or revoked. Performed at [facility], [place] [address], [place], [place] [address]          Radiology Studies: DG Chest Port 1 View  Result Date: 05/13/2019 CLINICAL DATA:  Shortness of breath and cough. EXAM: PORTABLE CHEST 1 VIEW COMPARISON:  September 08, 2017 FINDINGS: Multiple sternal wires and vascular clips are seen. Moderate severity atelectasis and/or infiltrate is seen within the left lung base. There is a large left pleural effusion. No pneumothorax is identified. There is stable mild to moderate severity enlargement of the cardiac silhouette. There is marked severity calcification of the aortic arch. Multilevel degenerative changes seen throughout the thoracic spine. IMPRESSION: 1. Moderate severity left basilar atelectasis and/or infiltrate. 2. Large left pleural effusion. 3. Stable cardiomegaly with evidence of prior CABG. Electronically Signed   By: Virgina Norfolk M.D.   On: 05/13/2019 20:23  Scheduled Meds: . diltiazem  360 mg Oral Daily  . insulin aspart  0-5 Units Subcutaneous QHS  . insulin aspart  0-9 Units Subcutaneous TID WC  . insulin glargine  20 Units Subcutaneous Daily  . potassium chloride  40 mEq Oral Q4H  . sodium chloride flush  3 mL Intravenous Q12H  . sodium chloride flush  3 mL Intravenous Q12H   Continuous Infusions: . sodium chloride    . azithromycin    . cefTRIAXone (ROCEPHIN)  IV    . heparin 1,000 Units/hr (05/14/19 0104)  . vancomycin       LOS: 1 day    Time spent: 35 minutes.    Dana Allan, MD  [facility] Pager #: [phone] 7PM-7AM contact night coverage as above

## 2019-05-14 NOTE — Progress Notes (Signed)
  Echocardiogram 2D Echocardiogram has been performed.  Bobbye Charleston 05/14/2019, 3:06 PM

## 2019-05-14 NOTE — Progress Notes (Signed)
[place] for Heparin (Apixaban on hold) Indication: atrial fibrillation  Allergies  Allergen Reactions  . Erythromycin Base   . Cefadroxil Other (See Comments)    Unknown Unknown  . Ciprofloxacin Other (See Comments)    Unknown Unknown  . Erythromycin Other (See Comments)    Upset stomach Upset stomach    Patient Measurements: Height: 5\' 6"  (167.6 cm) Weight: 197 lb 5 oz (89.5 kg) IBW/kg (Calculated) : 63.8  Vital Signs: Temp: 98.3 F (36.8 C) (03/15 1034) Temp Source: Oral (03/15 1034) BP: 125/66 (03/15 1034) Pulse Rate: 96 (03/15 1034)  Labs: Recent Labs    05/13/19 1950 05/14/19 0255 05/14/19 0901 05/14/19 0936 05/14/19 1104 05/14/19 1948  HGB 11.7* 10.5*  --   --   --   --   HCT 37.5* 33.2*  --   --   --   --   PLT 391 367  --   --   --   --   APTT 46*  --  51*  --   --  49*  LABPROT 23.0* 21.8*  --   --   --   --   INR 2.1* 1.9*  --   --   --   --   HEPARINUNFRC  --   --  >2.20*  --   --   --   CREATININE 1.14 1.05  --   --   --   --   TROPONINIHS  --   --   --  18* 16  --     Estimated Creatinine Clearance: 51 mL/min (by C-G formula based on SCr of 1.05 mg/dL).  Assessment: 86 y/o M presents to the ED with PNA/pleural effusion. On apxiaban PTA for afib. Holding apixaban and starting heparin in case pt needs thoracentesis/chest tube.    APTT is low; however, heparin infusion was off for a little over an hour for thoracentesis per RN.  Heparin was restarted around 1400 based on RN report; exact time is unknown.  No bleeding currently.  Goal of Therapy:  Heparin level 0.3-0.7 units/ml aPTT 66-102 seconds Monitor platelets by anticoagulation protocol: Yes   Plan:  Increase heparin gtt to 1300 units/hr Check 8 hr aPTT  Delontae Lamm D. Mina Marble, PharmD, BCPS, [facility] 05/14/2019, 8:32 PM

## 2019-05-14 NOTE — Plan of Care (Signed)
Patient alert and oriented with no c/o pain. Patient states his breathing has improved. Some dyspnea noted with exertion. Patient is scheduled for thoracentesis for pleural effusion. KCL of 3 is being replaced IV and PO. Patient called to update his wife while I was in the room. Will continue to monitor.

## 2019-05-14 NOTE — Progress Notes (Signed)
ANTICOAGULATION CONSULT NOTE - Initial Consult  Pharmacy Consult for Heparin (Apixaban on hold) Indication: atrial fibrillation  Allergies  Allergen Reactions  . Erythromycin Base   . Cefadroxil Other (See Comments)    Unknown Unknown  . Ciprofloxacin Other (See Comments)    Unknown Unknown  . Erythromycin Other (See Comments)    Upset stomach Upset stomach    Patient Measurements: Height: 5\' 6"  (167.6 cm) Weight: 197 lb 5 oz (89.5 kg) IBW/kg (Calculated) : 63.8  Vital Signs: Temp: 98.3 F (36.8 C) (03/15 1034) Temp Source: Oral (03/15 1034) BP: 125/66 (03/15 1034) Pulse Rate: 96 (03/15 1034)  Labs: Recent Labs    05/13/19 1950 05/14/19 0255 05/14/19 0901 05/14/19 0936  HGB 11.7* 10.5*  --   --   HCT 37.5* 33.2*  --   --   PLT 391 367  --   --   APTT 46*  --  51*  --   LABPROT 23.0* 21.8*  --   --   INR 2.1* 1.9*  --   --   HEPARINUNFRC  --   --  >2.20*  --   CREATININE 1.14 1.05  --   --   TROPONINIHS  --   --   --  18*    Estimated Creatinine Clearance: 51 mL/min (by C-G formula based on SCr of 1.05 mg/dL).  Assessment: 86 y/o M presents to the ED with PNA/pleural effusion. On apxiaban PTA for afib. Holding apixaban and starting heparin in case pt needs thoracentesis/chest tube.   Initial aptt 51 and hep lvl > 2.2, so will use aptt for monitoring now  Cbc stable  Goal of Therapy:  Heparin level 0.3-0.7 units/ml aPTT 66-102 seconds Monitor platelets by anticoagulation protocol: Yes   Plan:  Increase heparin gtt to 1150 units/hr Recheck aptt at 2000 Daily hep lvl aptt  Barth Kirks, PharmD, BCPS, BCCCP Clinical Pharmacist [phone]  Please check AMION for all [facility] numbers  05/14/2019 11:27 AM

## 2019-05-15 ENCOUNTER — Inpatient Hospital Stay (HOSPITAL_COMMUNITY): Payer: Medicare Other

## 2019-05-15 DIAGNOSIS — I472 Ventricular tachycardia: Secondary | ICD-10-CM

## 2019-05-15 DIAGNOSIS — I712 Thoracic aortic aneurysm, without rupture: Secondary | ICD-10-CM

## 2019-05-15 LAB — RENAL FUNCTION PANEL
Albumin: 2.2 g/dL — ABNORMAL LOW (ref 3.5–5.0)
Anion gap: 11 (ref 5–15)
BUN: 11 mg/dL (ref 8–23)
CO2: 25 mmol/L (ref 22–32)
Calcium: 8.7 mg/dL — ABNORMAL LOW (ref 8.9–10.3)
Chloride: 102 mmol/L (ref 98–111)
Creatinine, Ser: 0.99 mg/dL (ref 0.61–1.24)
GFR calc Af Amer: >60 mL/min (ref >60)
GFR calc non Af Amer: >60 mL/min (ref >60)
Glucose, Bld: 148 mg/dL — ABNORMAL HIGH (ref 70–99)
Phosphorus: 3.3 mg/dL (ref 2.5–4.6)
Potassium: 3.7 mmol/L (ref 3.5–5.1)
Sodium: 138 mmol/L (ref 135–145)

## 2019-05-15 LAB — CBC WITH DIFFERENTIAL/PLATELET
Abs Immature Granulocytes: 0.13 10*3/uL — ABNORMAL HIGH (ref 0.00–0.07)
Basophils Absolute: 0.1 10*3/uL (ref 0.0–0.1)
Basophils Relative: 1 %
Eosinophils Absolute: 0.6 10*3/uL — ABNORMAL HIGH (ref 0.0–0.5)
Eosinophils Relative: 3 %
HCT: 33.3 % — ABNORMAL LOW (ref 39.0–52.0)
Hemoglobin: 10.4 g/dL — ABNORMAL LOW (ref 13.0–17.0)
Immature Granulocytes: 1 %
Lymphocytes Relative: 7 %
Lymphs Abs: 1.3 10*3/uL (ref 0.7–4.0)
MCH: 25.7 pg — ABNORMAL LOW (ref 26.0–34.0)
MCHC: 31.2 g/dL (ref 30.0–36.0)
MCV: 82.4 fL (ref 80.0–100.0)
Monocytes Absolute: 2 10*3/uL — ABNORMAL HIGH (ref 0.1–1.0)
Monocytes Relative: 11 %
Neutro Abs: 14.6 10*3/uL — ABNORMAL HIGH (ref 1.7–7.7)
Neutrophils Relative %: 77 %
Platelets: 381 10*3/uL (ref 150–400)
RBC: 4.04 MIL/uL — ABNORMAL LOW (ref 4.22–5.81)
RDW: 16 % — ABNORMAL HIGH (ref 11.5–15.5)
WBC: 18.7 10*3/uL — ABNORMAL HIGH (ref 4.0–10.5)
nRBC: 0 % (ref 0.0–0.2)

## 2019-05-15 LAB — GLUCOSE, CAPILLARY
Glucose-Capillary: 131 mg/dL — ABNORMAL HIGH (ref 70–99)
Glucose-Capillary: 166 mg/dL — ABNORMAL HIGH (ref 70–99)
Glucose-Capillary: 138 mg/dL — ABNORMAL HIGH (ref 70–99)
Glucose-Capillary: 128 mg/dL — ABNORMAL HIGH (ref 70–99)

## 2019-05-15 LAB — URINE CULTURE: Culture: <10000 — AB

## 2019-05-15 LAB — PROTIME-INR
INR: 1.2 (ref 0.8–1.2)
Prothrombin Time: 15.4 seconds — ABNORMAL HIGH (ref 11.4–15.2)

## 2019-05-15 LAB — PROCALCITONIN: Procalcitonin: 0.59 ng/mL

## 2019-05-15 LAB — LEGIONELLA PNEUMOPHILA SEROGP 1 UR AG: L. pneumophila Serogp 1 Ur Ag: NEGATIVE

## 2019-05-15 LAB — MAGNESIUM: Magnesium: 2 mg/dL (ref 1.7–2.4)

## 2019-05-15 LAB — PH, BODY FLUID: pH, Body Fluid: 7.8

## 2019-05-15 LAB — APTT
aPTT: 66 seconds — ABNORMAL HIGH (ref 24–36)
aPTT: 60 seconds — ABNORMAL HIGH (ref 24–36)

## 2019-05-15 MED ORDER — METOPROLOL SUCCINATE ER 100 MG PO TB24
100.0000 mg | ORAL_TABLET | Freq: Every day | ORAL | Status: DC
  Administered 2019-05-15 – 2019-05-16 (×2): 100 mg via ORAL
  Filled 2019-05-15 (×2): qty 1

## 2019-05-15 MED ORDER — APIXABAN 5 MG PO TABS
5.0000 mg | ORAL_TABLET | Freq: Two times a day (BID) | ORAL | Status: DC
  Administered 2019-05-15 – 2019-05-18 (×7): 5 mg via ORAL
  Filled 2019-05-15 (×7): qty 1

## 2019-05-15 NOTE — Evaluation (Signed)
Clinical/Bedside Swallow Evaluation Patient Details  Name: [name] MRN: [id number] Date of Birth: 1928-09-30  Today's Date: 05/15/2019 Time: SLP Start Time (ACUTE ONLY): 0900 SLP Stop Time (ACUTE ONLY): 0915 SLP Time Calculation (min) (ACUTE ONLY): 15 min  Past Medical History:  Past Medical History:  Diagnosis Date  . Abdominal pain   . Acute renal failure ([place])     resolved  . Arthritis    "hands & legs" (11/'10/2014)  . Ataxia   . Atrial fibrillation ([place])   . Bladder outlet obstruction   . Bladder outlet obstruction   . BPH (benign prostatic hyperplasia)   . CAD (coronary artery disease)    a. CABG IN 1989. b. 01/08/2015 CTO of ost LAD, LIMA to LAD not visualized but assumed patent given myoview finding, occluded SVG to diagonal, 99% mid RCA tx w/ SYNERGY DES 3X28 mm  . Constipation   . Diabetes mellitus type 2, insulin dependent ([place])   . Epistaxis, recurrent Feb 2017  . GERD (gastroesophageal reflux disease)   . HTN (hypertension)   . Hx of bacterial pneumonia   . Hyperlipemia   . Kidney stones   . Mild aortic stenosis   . Pneumonia 04/2019  . Rib fractures    left rib fractures being treated with pain medications  . Stented coronary artery Nov 2016   RCA DES  . Urinary tract infection     Enterococcus   Past Surgical History:  Past Surgical History:  Procedure Laterality Date  . CARDIAC CATHETERIZATION  1989  . CARDIAC CATHETERIZATION N/A 01/08/2015   Procedure: Left Heart Cath and Cors/Grafts Angiography;  Surgeon: Peter M Martinique, MD;  Location: [facility];  Service: Cardiovascular;  Laterality: N/A;  . CARDIAC CATHETERIZATION  01/08/2015   Procedure: Coronary Stent Intervention;  Surgeon: Peter M Martinique, MD;  Location: [facility];  Service: Cardiovascular;;  . CARPAL TUNNEL RELEASE Right 08/2010  . CATARACT EXTRACTION W/ INTRAOCULAR LENS  IMPLANT, BILATERAL Bilateral   . CORONARY ANGIOPLASTY  01/08/15   RCA DES  . CORONARY ARTERY BYPASS  GRAFT  1989   "CABG X 2"  . IR THORACENTESIS ASP PLEURAL SPACE W/IMG GUIDE  05/14/2019  . JOINT REPLACEMENT    . TONSILLECTOMY    . TOTAL HIP ARTHROPLASTY Right 2000   HPI:  Patient is an 86 year old male with past medical history significant for coronary artery disease status post PCI and CABG, mild aortic stenosis, nephrolithiasis, hyperlipidemia, hypertension, diabetes mellitus, BPH, chronic atrial fibrillation on Eliquis.  Patient presented with a 1 week history of viral syndrome.  According to the patient, the problem started with sore throat about a week ago.  Subsequently, patient developed runny nose, chest congestion and nonproductive cough with associated chest pain (only on coughing).  No fever or chills.  On presentation to the hospital, significant leukocytosis was noted, and chest x-ray was said to reveal a left lower lobe infiltrate with significant left-sided pleural effusion.   Assessment / Plan / Recommendation Clinical Impression  Pt demonstrates normal swallowing ability with thin liquids and thin liquids with pills. He refused solids because his wife was bringing him a breakfast sandwich and she would be upset if he spoiled his appetite. He denies any difficulty swallowing, any reflux or any other symptoms that might indicate dysphagia or aspiration. Recommend pt continue current diet, will sign off.  SLP Visit Diagnosis: Dysphagia, unspecified (R13.10)    Aspiration Risk  Mild aspiration risk    Diet Recommendation Regular;Thin liquid  Liquid Administration via: Cup;Straw Medication Administration: Whole meds with liquid Supervision: Patient able to self feed Postural Changes: Seated upright at 90 degrees    Other  Recommendations Oral Care Recommendations: Oral care BID   Follow up Recommendations        Frequency and Duration            Prognosis        Swallow Study   General HPI: Patient is an 86 year old male with past medical history significant for  coronary artery disease status post PCI and CABG, mild aortic stenosis, nephrolithiasis, hyperlipidemia, hypertension, diabetes mellitus, BPH, chronic atrial fibrillation on Eliquis.  Patient presented with a 1 week history of viral syndrome.  According to the patient, the problem started with sore throat about a week ago.  Subsequently, patient developed runny nose, chest congestion and nonproductive cough with associated chest pain (only on coughing).  No fever or chills.  On presentation to the hospital, significant leukocytosis was noted, and chest x-ray was said to reveal a left lower lobe infiltrate with significant left-sided pleural effusion. Type of Study: Bedside Swallow Evaluation Diet Prior to this Study: Regular;Thin liquids Temperature Spikes Noted: No Respiratory Status: Room air History of Recent Intubation: No Behavior/Cognition: Alert;Cooperative;Pleasant mood Oral Cavity Assessment: Within Functional Limits Oral Care Completed by SLP: No Oral Cavity - Dentition: Adequate natural dentition Vision: Functional for self-feeding Self-Feeding Abilities: Able to feed self Patient Positioning: Upright in bed Baseline Vocal Quality: Normal Volitional Cough: Strong Volitional Swallow: Able to elicit    Oral/Motor/Sensory Function Overall Oral Motor/Sensory Function: Within functional limits   Ice Chips     Thin Liquid Thin Liquid: Within functional limits    Nectar Thick Nectar Thick Liquid: Not tested   Honey Thick Honey Thick Liquid: Not tested   Puree Puree: Not tested   Solid     Solid: Not tested     Herbie Baltimore, MA [facility] Pager [phone] Office [phone]  Lynann Beaver 05/15/2019,11:00 AM

## 2019-05-15 NOTE — Progress Notes (Signed)
[facility] for Heparin (Apixaban on hold) Indication: atrial fibrillation  Allergies  Allergen Reactions  . Erythromycin Base   . Cefadroxil Other (See Comments)    Unknown Unknown  . Ciprofloxacin Other (See Comments)    Unknown Unknown  . Erythromycin Other (See Comments)    Upset stomach Upset stomach    Patient Measurements: Height: 5\' 6"  (167.6 cm) Weight: 197 lb 5 oz (89.5 kg) IBW/kg (Calculated) : 63.8  Vital Signs: Temp: 98.6 F (37 C) (03/15 2305) Temp Source: Oral (03/15 2134) BP: 124/70 (03/15 2305) Pulse Rate: 91 (03/15 2305)  Labs: Recent Labs    05/13/19 1950 05/13/19 1950 05/14/19 0255 05/14/19 0901 05/14/19 0936 05/14/19 1104 05/14/19 1948 05/15/19 0309  HGB 11.7*   < > 10.5*  --   --   --   --  10.4*  HCT 37.5*  --  33.2*  --   --   --   --  33.3*  PLT 391  --  367  --   --   --   --  381  APTT 46*   < >  --  51*  --   --  49* 66*  LABPROT 23.0*  --  21.8*  --   --   --   --  15.4*  INR 2.1*  --  1.9*  --   --   --   --  1.2  HEPARINUNFRC  --   --   --  >2.20*  --   --   --   --   CREATININE 1.14  --  1.05  --   --   --   --   --   TROPONINIHS  --   --   --   --  18* 16  --   --    < > = values in this interval not displayed.    Estimated Creatinine Clearance: 51 mL/min (by C-G formula based on SCr of 1.05 mg/dL).   Medical History: Past Medical History:  Diagnosis Date  . Abdominal pain   . Acute renal failure ([place])     resolved  . Arthritis    "hands & legs" (11/'10/2014)  . Ataxia   . Atrial fibrillation ([place])   . Bladder outlet obstruction   . Bladder outlet obstruction   . BPH (benign prostatic hyperplasia)   . CAD (coronary artery disease)    a. CABG IN 1989. b. 01/08/2015 CTO of ost LAD, LIMA to LAD not visualized but assumed patent given myoview finding, occluded SVG to diagonal, 99% mid RCA tx w/ SYNERGY DES 3X28 mm  . Constipation   . Diabetes mellitus type 2, insulin dependent ([place])    . Epistaxis, recurrent Feb 2017  . GERD (gastroesophageal reflux disease)   . HTN (hypertension)   . Hx of bacterial pneumonia   . Hyperlipemia   . Kidney stones   . Mild aortic stenosis   . Pneumonia 04/2019  . Rib fractures    left rib fractures being treated with pain medications  . Stented coronary artery Nov 2016   RCA DES  . Urinary tract infection     Enterococcus    Assessment: 86 y/o M presents to the ED with PNA/pleural effusion. On apxiaban PTA for afib. Holding apixaban and starting heparin in case pt needs thoracentesis/chest tube. Last dose of apixaban was greater than 12 hours ago. Will start heparin now. Anticipate using aPTT to dose for now.  3/16 AM update:  APTT within therapeutic range  Goal of Therapy:  Heparin level 0.3-0.7 units/ml aPTT 66-102 seconds Monitor platelets by anticoagulation protocol: Yes   Plan:  Cont heparin drip at 1300 units/hr Confirmatory aPTT at [place], PharmD, [place] Pharmacist Phone: [phone]

## 2019-05-15 NOTE — Progress Notes (Signed)
PROGRESS NOTE    [name]  [id number] DOB: May 26, 1928 DOA: 05/13/2019 PCP: [name], MD  Outpatient Specialists:   Brief Narrative:  Patient is an 86 year old male with past medical history significant for coronary artery disease status post PCI and CABG, mild aortic stenosis, nephrolithiasis, hyperlipidemia, hypertension, diabetes mellitus, BPH, chronic atrial fibrillation on Eliquis.  Patient presented with a 1 week history of viral syndrome.  According to the patient, the problem started with sore throat about a week ago.  Subsequently, patient developed runny nose, chest congestion and nonproductive cough with associated chest pain (only on coughing).  No fever or chills.  On presentation to the hospital, significant leukocytosis was noted, and chest x-ray was said to reveal a left lower lobe infiltrate with significant left-sided pleural effusion.  Work-up and management of pneumonia is in progress.  Patient is on heparin for now.  Plan is for thoracentesis and pleural fluid analysis.  Further management will depend on hospital course.  05/15/2019: Patient has undergone left-sided thoracentesis.  Pleural fluid is exudative.  Follow final cultures.  Continue IV antibiotics, but discontinue IV vancomycin.  Cardiology input is appreciated.  Patient looks a lot better today.  Updated patient's wife.  No fever documented.  Significantly elevated CRP.  Follow results of final pleural fluid analysis.  Assessment & Plan:   Principal Problem:   Left lower lobe pneumonia Active Problems:   Essential (primary) hypertension   Diabetes mellitus type 2, insulin dependent (HCC)   Chronic atrial fibrillation (HCC)   CAD S/P PCI- Nov 2016   Chronic diastolic CHF (congestive heart failure) (HCC)   Pleural effusion, left   Pleural effusion   Atrial fibrillation with RVR ([place])   1. Pneumonia; large exudative left pleural effusion  - Presented with acute viral syndrome, significant  leukocytosis and shortness of breath. -Chest x-ray is interpreted as revealed lung left lower lobe infiltrate with significant left-sided pleural effusion. -Continue IV ceftriaxone and azithromycin. -Add IV vancomycin (considering possibility of post viral pneumonia) -Nasal swab for MRSA.   -Respiratory virus panel  -Swallowing evaluation  -Consult interventional radiology for thoracentesis and pleural fluid analysis.  -Follow-up on the pending cultures. 05/15/2019: Patient looks better today.  Shortness of breath has improved significantly.  No fever documented.  Complete course of antibiotics.  Follow final pleural fluid analysis.  2. Atrial fibrillation with RVR - Rate-controlled  -Continue beta-blocker and Cardizem -Continue anticoagulation (change heparin to Eliquis).   3. CAD  - No typical chest pain.   -Only experiences chest pain while coughing.   -Troponin is negative.    4. Chronic diastolic CHF  - Appears compensated  - Check cardiac BNP.   5. Insulin-dependent DM  - A1c was 8.5% in December 2020  - Continue CBG checks and insulin    6.  History of mild to moderate aortic stenosis and mild dilatation of the ascending aorta: -Repeat echocardiogram -Further management depend on hospital course  7.  Hypokalemia: -Monitor and replace. -Check magnesium level.  DVT prophylaxis: Patient is currently on heparin drip.  Patient was on Eliquis prior to admission. Code Status: Full Family Communication:  Disposition Plan: This will depend on hospital course   Consultants:   Interventional radiology for thoracentesis  Cardiology  Procedures:   Ultrasound-guided thoracentesis.    Antimicrobials:   IV Rocephin  IV azithromycin  IV vancomycin discontinued today, 05/15/2019.   Subjective: No fever or chills Shortness of breath persists. Patient continues to have cough  Objective: Vitals:  05/14/19 2134 05/14/19 2305 05/15/19 0500 05/15/19 0915  BP:  139/76 124/70  107/69  Pulse:  91  (!) 108  Resp:  14  20  Temp: 97.6 F (36.4 C) 98.6 F (37 C)  98.5 F (36.9 C)  TempSrc: Oral   Oral  SpO2: 98% 96%  99%  Weight:   92.9 kg   Height:        Intake/Output Summary (Last 24 hours) at 05/15/2019 1538 Last data filed at 05/15/2019 0600 Gross per 24 hour  Intake 954.97 ml  Output --  Net 954.97 ml   Filed Weights   05/13/19 2050 05/14/19 0050 05/15/19 0500  Weight: 85.3 kg 89.5 kg 92.9 kg    Examination:  General exam: Appears calm and in mild respiratory distress.    Respiratory system: Improved air entry. Cardiovascular system: S1 & S2 Gastrointestinal system: Abdomen is obese, soft and nontender. No organomegaly or masses felt. Normal bowel sounds heard. Central nervous system: Awake and alert.  Patient moves all extremities.   Extremities: No leg edema  Data Reviewed: I have personally reviewed following labs and imaging studies  CBC: Recent Labs  Lab 05/13/19 1950 05/14/19 0255 05/15/19 0309  WBC 20.4* 18.5* 18.7*  NEUTROABS 17.0* 16.1* 14.6*  HGB 11.7* 10.5* 10.4*  HCT 37.5* 33.2* 33.3*  MCV 83.1 82.6 82.4  PLT 391 367 [id number]   Basic Metabolic Panel: Recent Labs  Lab 05/13/19 1950 05/14/19 0255 05/14/19 1319 05/15/19 0309  NA 136 138  --  138  K 3.5 3.0*  --  3.7  CL 100 102  --  102  CO2 22 23  --  25  GLUCOSE 245* 175*  --  148*  BUN 16 15  --  11  CREATININE 1.14 1.05  --  0.99  CALCIUM 8.6* 8.4*  --  8.7*  MG  --  1.8  --  2.0  PHOS  --   --  3.5 3.3   GFR: Estimated Creatinine Clearance: 55 mL/min (by C-G formula based on SCr of 0.99 mg/dL). Liver Function Tests: Recent Labs  Lab 05/13/19 1950 05/15/19 0309  AST 19  --   ALT 15  --   ALKPHOS 75  --   BILITOT 1.2  --   PROT 5.7*  --   ALBUMIN 2.7* 2.2*   No results for input(s): LIPASE, AMYLASE in the last 168 hours. No results for input(s): AMMONIA in the last 168 hours. Coagulation Profile: Recent Labs  Lab 05/13/19 1950  05/14/19 0255 05/15/19 0309  INR 2.1* 1.9* 1.2   Cardiac Enzymes: No results for input(s): CKTOTAL, CKMB, CKMBINDEX, TROPONINI in the last 168 hours. BNP (last 3 results) No results for input(s): PROBNP in the last 8760 hours. HbA1C: No results for input(s): HGBA1C in the last 72 hours. CBG: Recent Labs  Lab 05/14/19 1127 05/14/19 1603 05/14/19 2106 05/15/19 0809 05/15/19 1219  GLUCAP 200* 241* 176* 131* 166*   Lipid Profile: No results for input(s): CHOL, HDL, LDLCALC, TRIG, CHOLHDL, LDLDIRECT in the last 72 hours. Thyroid Function Tests: No results for input(s): TSH, T4TOTAL, FREET4, T3FREE, THYROIDAB in the last 72 hours. Anemia Panel: No results for input(s): VITAMINB12, FOLATE, FERRITIN, TIBC, IRON, RETICCTPCT in the last 72 hours. Urine analysis:    Component Value Date/Time   COLORURINE YELLOW 05/13/2019 2048   APPEARANCEUR CLEAR 05/13/2019 2048   LABSPEC 1.027 05/13/2019 2048   PHURINE 5.0 05/13/2019 2048   GLUCOSEU >=500 (A) 05/13/2019 2048   HGBUR MODERATE (A)  05/13/2019 2048   BILIRUBINUR NEGATIVE 05/13/2019 2048   BILIRUBINUR Negative 03/24/2018 0841   KETONESUR NEGATIVE 05/13/2019 2048   PROTEINUR NEGATIVE 05/13/2019 2048   UROBILINOGEN 0.2 03/24/2018 0841   UROBILINOGEN 0.2 11/28/2010 1156   NITRITE NEGATIVE 05/13/2019 2048   LEUKOCYTESUR NEGATIVE 05/13/2019 2048   Sepsis Labs: @LABRCNTIP (procalcitonin:4,lacticidven:4)  ) Recent Results (from the past 240 hour(s))  Blood Culture (routine x 2)     Status: None (Preliminary result)   Collection Time: 05/13/19  7:51 PM   Specimen: BLOOD LEFT FOREARM  Result Value Ref Range Status   Specimen Description BLOOD LEFT FOREARM  Final   Special Requests   Final    BOTTLES DRAWN AEROBIC AND ANAEROBIC Blood Culture adequate volume   Culture   Final    NO GROWTH 2 DAYS Performed at [facility], [place] [address] [place], [place] [address]    Report Status PENDING  Incomplete  Urine culture      Status: Abnormal   Collection Time: 05/13/19  8:48 PM   Specimen: Urine, Clean Catch  Result Value Ref Range Status   Specimen Description URINE, CLEAN CATCH  Final   Special Requests NONE  Final   Culture (A)  Final    <10,000 COLONIES/mL INSIGNIFICANT GROWTH Performed at [facility], [place] [address], [place], [place] [address]    Report Status 05/15/2019 FINAL  Final  Blood Culture (routine x 2)     Status: None (Preliminary result)   Collection Time: 05/13/19  9:25 PM   Specimen: BLOOD  Result Value Ref Range Status   Specimen Description BLOOD RIGHT ARM  Final   Special Requests   Final    BOTTLES DRAWN AEROBIC AND ANAEROBIC Blood Culture adequate volume   Culture   Final    NO GROWTH 2 DAYS Performed at [facility], [place] [address], [place], [place] [address]    Report Status PENDING  Incomplete  Respiratory Panel by RT PCR (Flu A&B, Covid) - Nasopharyngeal Swab     Status: None   Collection Time: 05/13/19  9:39 PM   Specimen: Nasopharyngeal Swab  Result Value Ref Range Status   SARS Coronavirus 2 by RT PCR NEGATIVE NEGATIVE Final    Comment: (NOTE) SARS-CoV-2 target nucleic acids are NOT DETECTED. The SARS-CoV-2 RNA is generally detectable in upper respiratoy specimens during the acute phase of infection. The lowest concentration of SARS-CoV-2 viral copies this assay can detect is 131 copies/mL. A negative result does not preclude SARS-Cov-2 infection and should not be used as the sole basis for treatment or other patient management decisions. A negative result may occur with  improper specimen collection/handling, submission of specimen other than nasopharyngeal swab, presence of viral mutation(s) within the areas targeted by this assay, and inadequate number of viral copies (<131 copies/mL). A negative result must be combined with clinical observations, patient history, and epidemiological information. The expected result is Negative. Fact Sheet for  Patients:  PinkCheek.be Fact Sheet for Healthcare Providers:  GravelBags.it This test is not yet ap proved or cleared by the [place] FDA and  has been authorized for detection and/or diagnosis of SARS-CoV-2 by FDA under an Emergency Use Authorization (EUA). This EUA will remain  in effect (meaning this test can be used) for the duration of the COVID-19 declaration under Section 564(b)(1) of the Act, 21 U.S.C. section 360bbb-3(b)(1), unless the authorization is terminated or revoked sooner.    Influenza A by PCR NEGATIVE NEGATIVE Final  Influenza B by PCR NEGATIVE NEGATIVE Final    Comment: (NOTE) The Xpert Xpress SARS-CoV-2/FLU/RSV assay is intended as an aid in  the diagnosis of influenza from Nasopharyngeal swab specimens and  should not be used as a sole basis for treatment. Nasal washings and  aspirates are unacceptable for Xpert Xpress SARS-CoV-2/FLU/RSV  testing. Fact Sheet for Patients: PinkCheek.be Fact Sheet for Healthcare Providers: GravelBags.it This test is not yet approved or cleared by the [place] FDA and  has been authorized for detection and/or diagnosis of SARS-CoV-2 by  FDA under an Emergency Use Authorization (EUA). This EUA will remain  in effect (meaning this test can be used) for the duration of the  Covid-19 declaration under Section 564(b)(1) of the Act, 21  U.S.C. section 360bbb-3(b)(1), unless the authorization is  terminated or revoked. Performed at [facility], [place] [address], [place], [place] [address]   MRSA PCR Screening     Status: None   Collection Time: 05/14/19  8:47 AM   Specimen: Nasal Mucosa; Nasopharyngeal  Result Value Ref Range Status   MRSA by PCR NEGATIVE NEGATIVE Final    Comment:        The GeneXpert MRSA Assay (FDA approved for NASAL specimens only), is one component of a comprehensive MRSA  colonization surveillance program. It is not intended to diagnose MRSA infection nor to guide or monitor treatment for MRSA infections. Performed at [facility], [place] [address] [place], [place] [address]   Respiratory Panel by PCR     Status: None   Collection Time: 05/14/19 10:10 AM  Result Value Ref Range Status   Adenovirus NOT DETECTED NOT DETECTED Final   Coronavirus 229E NOT DETECTED NOT DETECTED Final    Comment: (NOTE) The Coronavirus on the Respiratory Panel, DOES NOT test for the novel  Coronavirus (2019 nCoV)    Coronavirus HKU1 NOT DETECTED NOT DETECTED Final   Coronavirus NL63 NOT DETECTED NOT DETECTED Final   Coronavirus OC43 NOT DETECTED NOT DETECTED Final   Metapneumovirus NOT DETECTED NOT DETECTED Final   Rhinovirus / Enterovirus NOT DETECTED NOT DETECTED Final   Influenza A NOT DETECTED NOT DETECTED Final   Influenza B NOT DETECTED NOT DETECTED Final   Parainfluenza Virus 1 NOT DETECTED NOT DETECTED Final   Parainfluenza Virus 2 NOT DETECTED NOT DETECTED Final   Parainfluenza Virus 3 NOT DETECTED NOT DETECTED Final   Parainfluenza Virus 4 NOT DETECTED NOT DETECTED Final   Respiratory Syncytial Virus NOT DETECTED NOT DETECTED Final   Bordetella pertussis NOT DETECTED NOT DETECTED Final   Chlamydophila pneumoniae NOT DETECTED NOT DETECTED Final   Mycoplasma pneumoniae NOT DETECTED NOT DETECTED Final    Comment: Performed at [facility], [place]. [address], [place], [place] [address]  Culture, body fluid-bottle     Status: None (Preliminary result)   Collection Time: 05/14/19  1:28 PM   Specimen: Pleura  Result Value Ref Range Status   Specimen Description PLEURAL FLUID  Final   Special Requests BOTTLES DRAWN AEROBIC AND ANAEROBIC 10CC  Final   Culture   Final    NO GROWTH < 24 HOURS Performed at [facility], [place] [address] [place], [place] [address]    Report Status PENDING  Incomplete  Gram stain     Status: None   Collection Time:  05/14/19  1:28 PM   Specimen: Pleura  Result Value Ref Range Status   Specimen Description PLEURAL FLUID  Final   Special  Requests NONE  Final   Gram Stain   Final    MODERATE WBC PRESENT, PREDOMINANTLY MONONUCLEAR NO ORGANISMS SEEN Performed at [facility], [place] [address] [place], [place] [address]    Report Status 05/14/2019 FINAL  Final         Radiology Studies: DG Chest 1 View  Result Date: 05/14/2019 CLINICAL DATA:  Post left thoracentesis EXAM: CHEST  1 VIEW COMPARISON:  05/13/2019 FINDINGS: Decreased left pleural effusion post thoracentesis. No pneumothorax. Improved left lung aeration with residual left basilar atelectasis. Right lung remains clear. Stable cardiomediastinal contours. IMPRESSION: Decreased left pleural effusion post thoracentesis. No pneumothorax. Residual left basilar atelectasis. Electronically Signed   By: Macy Mis M.D.   On: 05/14/2019 13:37   DG Chest 2 View  Result Date: 05/15/2019 CLINICAL DATA:  Admitted for shortness of breath. Pneumonia. EXAM: CHEST - 2 VIEW COMPARISON:  05/14/2019 FINDINGS: Status post median sternotomy and CABG procedure. Cardiac enlargement. Aortic atherosclerosis. Moderate left pleural effusion. Left midlung and left base airspace opacities are unchanged. Right lung is clear. IMPRESSION: 1. Persistent left pleural effusion with left midlung and left base opacities. 2.  Aortic Atherosclerosis (ICD10-I70.0). Electronically Signed   By: Kerby Moors M.D.   On: 05/15/2019 11:39   DG Chest Port 1 View  Result Date: 05/13/2019 CLINICAL DATA:  Shortness of breath and cough. EXAM: PORTABLE CHEST 1 VIEW COMPARISON:  September 08, 2017 FINDINGS: Multiple sternal wires and vascular clips are seen. Moderate severity atelectasis and/or infiltrate is seen within the left lung base. There is a large left pleural effusion. No pneumothorax is identified. There is stable mild to moderate severity enlargement of the cardiac silhouette. There  is marked severity calcification of the aortic arch. Multilevel degenerative changes seen throughout the thoracic spine. IMPRESSION: 1. Moderate severity left basilar atelectasis and/or infiltrate. 2. Large left pleural effusion. 3. Stable cardiomegaly with evidence of prior CABG. Electronically Signed   By: Virgina Norfolk M.D.   On: 05/13/2019 20:23   ECHOCARDIOGRAM COMPLETE  Result Date: 05/14/2019    ECHOCARDIOGRAM REPORT   Patient Name:   GILLIE ADDARIO Date of Exam: 05/14/2019 Medical Rec #:  [id number]       Height:       66.0 in Accession #:    [id number]      Weight:       197.3 lb Date of Birth:  23-Jun-1928       BSA:          1.988 m Patient Age:    30 years        BP:           125/66 mmHg Patient Gender: M               HR:           96 bpm. Exam Location:  Inpatient Procedure: 2D Echo, Cardiac Doppler and Color Doppler Indications:    I35.0 Nonrheumatic aortic (valve) stenosis  History:        Patient has prior history of Echocardiogram examinations, most                 recent 10/10/2018. CHF, CAD, Abnormal ECG and Prior CABG, Stroke;                 Risk Factors:Hypertension and Diabetes. Edema. Post                 thoracentsis.  Sonographer:    [place] Referring  Phys: 80 Jamiesha Victoria I Aalivia Mcgraw  Sonographer Comments: Technically difficult study due to poor echo windows. IMPRESSIONS  1. Left ventricular ejection fraction, by estimation, is 60 to 65%. The left ventricle has normal function. The left ventricle has no regional wall motion abnormalities. There is moderate concentric left ventricular hypertrophy. Left ventricular diastolic parameters are indeterminate.  2. Right ventricular systolic function is normal. The right ventricular size is normal.  3. The mitral valve is normal in structure. No evidence of mitral valve regurgitation. No evidence of mitral stenosis.  4. The aortic valve is normal in structure. Aortic valve regurgitation is mild. Moderate aortic valve stenosis. Aortic  valve area, by VTI measures 1.27 cm. Aortic valve mean gradient measures 19.3 mmHg. Aortic valve Vmax measures 2.75 m/s.  5. Compared with th echo 09/2018, ascending aorta has increased from 4.3 cm to 4.8cm. . Aortic dilatation noted. There is moderate dilatation of the ascending aorta measuring 48 mm.  6. The inferior vena cava is normal in size with greater than 50% respiratory variability, suggesting right atrial pressure of 3 mmHg. FINDINGS  Left Ventricle: Left ventricular ejection fraction, by estimation, is 60 to 65%. The left ventricle has normal function. The left ventricle has no regional wall motion abnormalities. The left ventricular internal cavity size was normal in size. There is  moderate concentric left ventricular hypertrophy. Left ventricular diastolic parameters are indeterminate. Right Ventricle: The right ventricular size is normal. No increase in right ventricular wall thickness. Right ventricular systolic function is normal. Left Atrium: Left atrial size was normal in size. Right Atrium: Right atrial size was normal in size. Pericardium: There is no evidence of pericardial effusion. Mitral Valve: The mitral valve is normal in structure. Normal mobility of the mitral valve leaflets. No evidence of mitral valve regurgitation. No evidence of mitral valve stenosis. Tricuspid Valve: The tricuspid valve is normal in structure. Tricuspid valve regurgitation is mild . No evidence of tricuspid stenosis. Aortic Valve: The aortic valve is normal in structure.. There is moderate thickening and moderate calcification of the aortic valve. Aortic valve regurgitation is mild. Moderate aortic stenosis is present. There is moderate thickening of the aortic valve. There is moderate calcification of the aortic valve. Aortic valve mean gradient measures 19.3 mmHg. Aortic valve peak gradient measures 30.3 mmHg. Aortic valve area, by VTI measures 1.27 cm. Pulmonic Valve: The pulmonic valve was normal in structure.  Pulmonic valve regurgitation is not visualized. No evidence of pulmonic stenosis. Aorta: Compared with th echo 09/2018, ascending aorta has increased from 4.3 cm to 4.8cm. The aortic root is normal in size and structure and aortic dilatation noted. There is moderate dilatation of the ascending aorta measuring 48 mm. Venous: The inferior vena cava was not well visualized. The inferior vena cava is normal in size with greater than 50% respiratory variability, suggesting right atrial pressure of 3 mmHg. IAS/Shunts: No atrial level shunt detected by color flow Doppler.  LEFT VENTRICLE PLAX 2D LVIDd:         4.90 cm LVIDs:         2.60 cm LV PW:         1.30 cm LV IVS:        1.50 cm LVOT diam:     2.20 cm LV SV:         65 LV SV Index:   33 LVOT Area:     3.80 cm  LV Volumes (MOD) LV vol d, MOD A2C: 56.1 ml LV vol d, MOD A4C:  43.4 ml LV vol s, MOD A2C: 23.6 ml LV vol s, MOD A4C: 20.7 ml LV SV MOD A2C:     32.5 ml LV SV MOD A4C:     43.4 ml LV SV MOD BP:      26.7 ml RIGHT VENTRICLE         IVC TAPSE (M-mode): 1.3 cm  IVC diam: 2.50 cm LEFT ATRIUM         Index LA diam:    4.55 cm 2.29 cm/m  AORTIC VALVE AV Area (Vmax):    1.45 cm AV Area (Vmean):   1.14 cm AV Area (VTI):     1.27 cm AV Vmax:           275.33 cm/s AV Vmean:          204.667 cm/s AV VTI:            0.513 m AV Peak Grad:      30.3 mmHg AV Mean Grad:      19.3 mmHg LVOT Vmax:         105.00 cm/s LVOT Vmean:        61.500 cm/s LVOT VTI:          0.172 m LVOT/AV VTI ratio: 0.34  AORTA Ao Root diam: 3.90 cm Ao Asc diam:  4.80 cm MITRAL VALVE                TRICUSPID VALVE MV Area (PHT): 3.29 cm     TR Peak grad:   24.2 mmHg MV Decel Time: 231 msec     TR Vmax:        246.00 cm/s MV E velocity: 113.50 cm/s                             SHUNTS                             Systemic VTI:  0.17 m                             Systemic Diam: 2.20 cm Skeet Latch MD Electronically signed by Skeet Latch MD Signature Date/Time: 05/14/2019/6:52:07 PM    Final     IR THORACENTESIS ASP PLEURAL SPACE W/IMG GUIDE  Result Date: 05/14/2019 INDICATION: Patient with history of pneumonia, now with left pleural effusion. Request is made for diagnostic and therapeutic left thoracentesis. EXAM: ULTRASOUND GUIDED DIAGNOSTIC AND THERAPEUTIC LEFT THORACENTESIS MEDICATIONS: 10 mL 1% lidocaine COMPLICATIONS: None immediate. PROCEDURE: An ultrasound guided thoracentesis was thoroughly discussed with the patient and questions answered. The benefits, risks, alternatives and complications were also discussed. The patient understands and wishes to proceed with the procedure. Written consent was obtained. Ultrasound was performed to localize and mark an adequate pocket of fluid in the left chest. The area was then prepped and draped in the normal sterile fashion. 1% Lidocaine was used for local anesthesia. Under ultrasound guidance a 6 Fr Safe-T-Centesis catheter was introduced. Thoracentesis was performed. The catheter was removed and a dressing applied. FINDINGS: A total of approximately 900 mL of amber fluid was removed. Samples were sent to the laboratory as requested by the clinical team. IMPRESSION: Successful ultrasound guided diagnostic and therapeutic left thoracentesis yielding 900 mL of pleural fluid. Read by: Brynda Greathouse [name] Electronically Signed   By: Lucrezia Europe M.D.   On:  05/14/2019 13:50        Scheduled Meds: . apixaban  5 mg Oral BID  . diltiazem  360 mg Oral Daily  . insulin aspart  0-5 Units Subcutaneous QHS  . insulin aspart  0-9 Units Subcutaneous TID WC  . insulin glargine  20 Units Subcutaneous Daily  . metoprolol succinate  100 mg Oral Daily  . sodium chloride flush  3 mL Intravenous Q12H  . sodium chloride flush  3 mL Intravenous Q12H   Continuous Infusions: . sodium chloride    . azithromycin Stopped (05/15/19 0700)  . cefTRIAXone (ROCEPHIN)  IV Stopped (05/15/19 0700)     LOS: 2 days    Time spent: 35 minutes.    Dana Allan,  MD  [facility] Pager #: [phone] 7PM-7AM contact night coverage as above

## 2019-05-15 NOTE — Progress Notes (Addendum)
MRSA PCR neg. Ok to [employer] and change course of ceftriaxone/azith to 14d today due to empyema per Dr. Marthenia Rolling.   Onnie Boer, PharmD, BCIDP, AAHIVP, CPP Infectious Disease Pharmacist 05/15/2019 10:03 AM

## 2019-05-15 NOTE — Progress Notes (Signed)
Progress Note  Patient Name: [name] Date of Encounter: 05/15/2019  Primary Cardiologist: Peter Martinique, MD   Subjective   Doing well.  No complaints.  Breathing much improved.  Afebrile x 24 hours  Inpatient Medications    Scheduled Meds: . diltiazem  360 mg Oral Daily  . insulin aspart  0-5 Units Subcutaneous QHS  . insulin aspart  0-9 Units Subcutaneous TID WC  . insulin glargine  20 Units Subcutaneous Daily  . metoprolol tartrate  25 mg Oral TID  . sodium chloride flush  3 mL Intravenous Q12H  . sodium chloride flush  3 mL Intravenous Q12H   Continuous Infusions: . sodium chloride    . azithromycin 500 mg (05/14/19 2145)  . cefTRIAXone (ROCEPHIN)  IV 2 g (05/14/19 2337)  . heparin 1,300 Units/hr (05/15/19 0130)  . vancomycin 1,250 mg (05/14/19 1024)   PRN Meds: sodium chloride, acetaminophen **OR** acetaminophen, guaiFENesin-dextromethorphan, lidocaine (PF), ondansetron **OR** ondansetron (ZOFRAN) IV, oxyCODONE-acetaminophen **AND** oxyCODONE, phenol, sodium chloride flush   Vital Signs    Vitals:   05/14/19 1034 05/14/19 2134 05/14/19 2305 05/15/19 0500  BP: 125/66 139/76 124/70   Pulse: 96  91   Resp: 20  14   Temp: 98.3 F (36.8 C) 97.6 F (36.4 C) 98.6 F (37 C)   TempSrc: Oral Oral    SpO2: 100% 98% 96%   Weight:    92.9 kg  Height:        Intake/Output Summary (Last 24 hours) at 05/15/2019 0749 Last data filed at 05/15/2019 0600 Gross per 24 hour  Intake 1208.49 ml  Output 2 ml  Net 1206.49 ml   Filed Weights   05/13/19 2050 05/14/19 0050 05/15/19 0500  Weight: 85.3 kg 89.5 kg 92.9 kg    Telemetry    Atrial fibrillation with RVR in the 120's.  1 episode yesterday of WCT fo 15 beats - likely NSVT but cannot rule out aberration - Personally Reviewed  ECG    No new EKG to review - Personally Reviewed  Physical Exam   GEN: No acute distress.   Neck: No JVD Cardiac: irregularly irregular and tachcyardic, no murmurs, rubs, or  gallops.  Respiratory: Clear to auscultation bilaterally. GI: Soft, nontender, non-distended  MS: No edema; No deformity. Neuro:  Nonfocal  Psych: Normal affect   Labs    Chemistry Recent Labs  Lab 05/13/19 1950 05/14/19 0255 05/15/19 0309  NA 136 138 138  K 3.5 3.0* 3.7  CL 100 102 102  CO2 22 23 25   GLUCOSE 245* 175* 148*  BUN 16 15 11   CREATININE 1.14 1.05 0.99  CALCIUM 8.6* 8.4* 8.7*  PROT 5.7*  --   --   ALBUMIN 2.7*  --  2.2*  AST 19  --   --   ALT 15  --   --   ALKPHOS 75  --   --   BILITOT 1.2  --   --   GFRNONAA 57* >60 >60  GFRAA >60 >60 >60  ANIONGAP 14 13 11      Hematology Recent Labs  Lab 05/13/19 1950 05/14/19 0255 05/15/19 0309  WBC 20.4* 18.5* 18.7*  RBC 4.51 4.02* 4.04*  HGB 11.7* 10.5* 10.4*  HCT 37.5* 33.2* 33.3*  MCV 83.1 82.6 82.4  MCH 25.9* 26.1 25.7*  MCHC 31.2 31.6 31.2  RDW 15.8* 15.9* 16.0*  PLT 391 367 381    Cardiac EnzymesNo results for input(s): TROPONINI in the last 168 hours. No results for input(s): TROPIPOC  in the last 168 hours.   BNP Recent Labs  Lab 05/14/19 0936  BNP 272.9*     DDimer No results for input(s): DDIMER in the last 168 hours.   Radiology    DG Chest 1 View  Result Date: 05/14/2019 CLINICAL DATA:  Post left thoracentesis EXAM: CHEST  1 VIEW COMPARISON:  05/13/2019 FINDINGS: Decreased left pleural effusion post thoracentesis. No pneumothorax. Improved left lung aeration with residual left basilar atelectasis. Right lung remains clear. Stable cardiomediastinal contours. IMPRESSION: Decreased left pleural effusion post thoracentesis. No pneumothorax. Residual left basilar atelectasis. Electronically Signed   By: Macy Mis M.D.   On: 05/14/2019 13:37   DG Chest Port 1 View  Result Date: 05/13/2019 CLINICAL DATA:  Shortness of breath and cough. EXAM: PORTABLE CHEST 1 VIEW COMPARISON:  September 08, 2017 FINDINGS: Multiple sternal wires and vascular clips are seen. Moderate severity atelectasis and/or  infiltrate is seen within the left lung base. There is a large left pleural effusion. No pneumothorax is identified. There is stable mild to moderate severity enlargement of the cardiac silhouette. There is marked severity calcification of the aortic arch. Multilevel degenerative changes seen throughout the thoracic spine. IMPRESSION: 1. Moderate severity left basilar atelectasis and/or infiltrate. 2. Large left pleural effusion. 3. Stable cardiomegaly with evidence of prior CABG. Electronically Signed   By: Virgina Norfolk M.D.   On: 05/13/2019 20:23   ECHOCARDIOGRAM COMPLETE  Result Date: 05/14/2019    ECHOCARDIOGRAM REPORT   Patient Name:   [name] Date of Exam: 05/14/2019 Medical Rec #:  [id number]       Height:       66.0 in Accession #:    [id number]      Weight:       197.3 lb Date of Birth:  1928/08/05       BSA:          1.988 m Patient Age:    56 years        BP:           125/66 mmHg Patient Gender: M               HR:           96 bpm. Exam Location:  Inpatient Procedure: 2D Echo, Cardiac Doppler and Color Doppler Indications:    I35.0 Nonrheumatic aortic (valve) stenosis  History:        Patient has prior history of Echocardiogram examinations, most                 recent 10/10/2018. CHF, CAD, Abnormal ECG and Prior CABG, Stroke;                 Risk Factors:Hypertension and Diabetes. Edema. Post                 thoracentsis.  Sonographer:    Roseanna Rainbow RDCS Referring Phys: [place]  Sonographer Comments: Technically difficult study due to poor echo windows. IMPRESSIONS  1. Left ventricular ejection fraction, by estimation, is 60 to 65%. The left ventricle has normal function. The left ventricle has no regional wall motion abnormalities. There is moderate concentric left ventricular hypertrophy. Left ventricular diastolic parameters are indeterminate.  2. Right ventricular systolic function is normal. The right ventricular size is normal.  3. The mitral valve is normal in  structure. No evidence of mitral valve regurgitation. No evidence of mitral stenosis.  4. The aortic valve is normal in structure. Aortic valve regurgitation  is mild. Moderate aortic valve stenosis. Aortic valve area, by VTI measures 1.27 cm. Aortic valve mean gradient measures 19.3 mmHg. Aortic valve Vmax measures 2.75 m/s.  5. Compared with th echo 09/2018, ascending aorta has increased from 4.3 cm to 4.8cm. . Aortic dilatation noted. There is moderate dilatation of the ascending aorta measuring 48 mm.  6. The inferior vena cava is normal in size with greater than 50% respiratory variability, suggesting right atrial pressure of 3 mmHg. FINDINGS  Left Ventricle: Left ventricular ejection fraction, by estimation, is 60 to 65%. The left ventricle has normal function. The left ventricle has no regional wall motion abnormalities. The left ventricular internal cavity size was normal in size. There is  moderate concentric left ventricular hypertrophy. Left ventricular diastolic parameters are indeterminate. Right Ventricle: The right ventricular size is normal. No increase in right ventricular wall thickness. Right ventricular systolic function is normal. Left Atrium: Left atrial size was normal in size. Right Atrium: Right atrial size was normal in size. Pericardium: There is no evidence of pericardial effusion. Mitral Valve: The mitral valve is normal in structure. Normal mobility of the mitral valve leaflets. No evidence of mitral valve regurgitation. No evidence of mitral valve stenosis. Tricuspid Valve: The tricuspid valve is normal in structure. Tricuspid valve regurgitation is mild . No evidence of tricuspid stenosis. Aortic Valve: The aortic valve is normal in structure.. There is moderate thickening and moderate calcification of the aortic valve. Aortic valve regurgitation is mild. Moderate aortic stenosis is present. There is moderate thickening of the aortic valve. There is moderate calcification of the aortic  valve. Aortic valve mean gradient measures 19.3 mmHg. Aortic valve peak gradient measures 30.3 mmHg. Aortic valve area, by VTI measures 1.27 cm. Pulmonic Valve: The pulmonic valve was normal in structure. Pulmonic valve regurgitation is not visualized. No evidence of pulmonic stenosis. Aorta: Compared with th echo 09/2018, ascending aorta has increased from 4.3 cm to 4.8cm. The aortic root is normal in size and structure and aortic dilatation noted. There is moderate dilatation of the ascending aorta measuring 48 mm. Venous: The inferior vena cava was not well visualized. The inferior vena cava is normal in size with greater than 50% respiratory variability, suggesting right atrial pressure of 3 mmHg. IAS/Shunts: No atrial level shunt detected by color flow Doppler.  LEFT VENTRICLE PLAX 2D LVIDd:         4.90 cm LVIDs:         2.60 cm LV PW:         1.30 cm LV IVS:        1.50 cm LVOT diam:     2.20 cm LV SV:         65 LV SV Index:   33 LVOT Area:     3.80 cm  LV Volumes (MOD) LV vol d, MOD A2C: 56.1 ml LV vol d, MOD A4C: 43.4 ml LV vol s, MOD A2C: 23.6 ml LV vol s, MOD A4C: 20.7 ml LV SV MOD A2C:     32.5 ml LV SV MOD A4C:     43.4 ml LV SV MOD BP:      26.7 ml RIGHT VENTRICLE         IVC TAPSE (M-mode): 1.3 cm  IVC diam: 2.50 cm LEFT ATRIUM         Index LA diam:    4.55 cm 2.29 cm/m  AORTIC VALVE AV Area (Vmax):    1.45 cm AV Area (Vmean):   1.14  cm AV Area (VTI):     1.27 cm AV Vmax:           275.33 cm/s AV Vmean:          204.667 cm/s AV VTI:            0.513 m AV Peak Grad:      30.3 mmHg AV Mean Grad:      19.3 mmHg LVOT Vmax:         105.00 cm/s LVOT Vmean:        61.500 cm/s LVOT VTI:          0.172 m LVOT/AV VTI ratio: 0.34  AORTA Ao Root diam: 3.90 cm Ao Asc diam:  4.80 cm MITRAL VALVE                TRICUSPID VALVE MV Area (PHT): 3.29 cm     TR Peak grad:   24.2 mmHg MV Decel Time: 231 msec     TR Vmax:        246.00 cm/s MV E velocity: 113.50 cm/s                             SHUNTS                              Systemic VTI:  0.17 m                             Systemic Diam: 2.20 cm Skeet Latch MD Electronically signed by Skeet Latch MD Signature Date/Time: 05/14/2019/6:52:07 PM    Final    IR THORACENTESIS ASP PLEURAL SPACE W/IMG GUIDE  Result Date: 05/14/2019 INDICATION: Patient with history of pneumonia, now with left pleural effusion. Request is made for diagnostic and therapeutic left thoracentesis. EXAM: ULTRASOUND GUIDED DIAGNOSTIC AND THERAPEUTIC LEFT THORACENTESIS MEDICATIONS: 10 mL 1% lidocaine COMPLICATIONS: None immediate. PROCEDURE: An ultrasound guided thoracentesis was thoroughly discussed with the patient and questions answered. The benefits, risks, alternatives and complications were also discussed. The patient understands and wishes to proceed with the procedure. Written consent was obtained. Ultrasound was performed to localize and mark an adequate pocket of fluid in the left chest. The area was then prepped and draped in the normal sterile fashion. 1% Lidocaine was used for local anesthesia. Under ultrasound guidance a 6 Fr Safe-T-Centesis catheter was introduced. Thoracentesis was performed. The catheter was removed and a dressing applied. FINDINGS: A total of approximately 900 mL of amber fluid was removed. Samples were sent to the laboratory as requested by the clinical team. IMPRESSION: Successful ultrasound guided diagnostic and therapeutic left thoracentesis yielding 900 mL of pleural fluid. Read by: Brynda Greathouse [name] Electronically Signed   By: Lucrezia Europe M.D.   On: 05/14/2019 13:50    Cardiac Studies   2D echo 05/14/2019 IMPRESSIONS   1. Left ventricular ejection fraction, by estimation, is 60 to 65%. The  left ventricle has normal function. The left ventricle has no regional  wall motion abnormalities. There is moderate concentric left ventricular  hypertrophy. Left ventricular  diastolic parameters are indeterminate.  2. Right ventricular systolic  function is normal. The right ventricular  size is normal.  3. The mitral valve is normal in structure. No evidence of mitral valve  regurgitation. No evidence of mitral stenosis.  4. The aortic valve is normal in structure.  Aortic valve regurgitation is  mild. Moderate aortic valve stenosis. Aortic valve area, by VTI measures  1.27 cm. Aortic valve mean gradient measures 19.3 mmHg. Aortic valve Vmax  measures 2.75 m/s.  5. Compared with th echo 09/2018, ascending aorta has increased from 4.3  cm to 4.8cm. . Aortic dilatation noted. There is moderate dilatation of  the ascending aorta measuring 48 mm.  6. The inferior vena cava is normal in size with greater than 50%  respiratory variability, suggesting right atrial pressure of 3 mmHg.   Patient Profile     86 y.o. male with a hx of persistent atrial fibrillation on Eliquis, CAD s/p CABG in 1990 and DES to RCA in 2006, CVA in April [id number], chronic diastolic HF (EF [id number]), moderate AS, remote tobacco use, HTN, DM2, HLD, and right carotid stenosis 60% who is being seen  for the evaluation of Afib RVR at the request of Dr. Marthenia Rolling.  Assessment & Plan    1.  Sepsis/PNA/left large pleural effusion -Cxray with LLL PNA and pleural effusion -continue IV antibx per THR -s/p thoracentesis of 1L amber pleural fluid today and feels much better -lungs clear this am and SOB significantly improved -COVID negative  2.  Permanent atrial fibrillation -HR has been elevated likely related to fever, sepsis as well as reduction in home BB dose due to soft BP -HR currently in the 120's despite increasing Lopressor to 25mg  TID -continue Cardizem 360mg  daily -change Lopressor to home dose of Toprol XL but only 100mg  daily for now (was on BID at home) -ok from cards standpoint to change IV Heparin gtt back to  Eliquis when ok with TRH  3.  ASCAD -s/p remote CABG in 1990 and PCI of mRCA in 2006 -he denies any anginal sx - just pleuritic pain  related to pleural effusion that has improved after thoracentesis -2D echo with normal LVF EF 60-65% -hsTrop normal at 18>16 -continue statin and BB -no ASA due to DOAC  4.  Chronic diastolic CHF -appears euvolemic on exam -will hold off on restarting diuretic for now as he does not appear volume overloaded and do not want to drop BP while needing higher doses of BB for rate control  5.  HTN -BP improved with treatment of underlying infection - 124/78mmHg this am -continue Cardizem -changing back to Toprol XL but only 100mg  daily for now (was on 100mg   BID)  6.  Aortic Stenosis -moderate by echo with mean AVG  19.35mmHg, Vmax  2.40m/s and AVA 1.27cm2.   -continue to follow yearly  7.  Ascending aortic aneurysm -measures 42mm on echo which has increased from 4.3 in August -this will need to be followed up on outpt -continue BB and statin  8.  Wide complex tachycardia -15 beats on tele -? NSVT vs. Aberration in setting of afib -continue BB -LVF normal   I have spent a total of 30 minutes with patient reviewing 2D echo, hospital notes, Cxray , telemetry, EKGs, labs and examining patient as well as establishing an assessment and plan that was discussed with the patient.  > 50% of time was spent in direct patient care.    For questions or updates, please contact [place] Please consult [email or web address] for contact info under Cardiology/STEMI.      Signed, Fransico Him, MD  05/15/2019, 7:49 AM

## 2019-05-15 NOTE — Progress Notes (Signed)
[facility] for Heparin>apixaban Indication: atrial fibrillation  Allergies  Allergen Reactions  . Erythromycin Base   . Cefadroxil Other (See Comments)    Unknown Unknown  . Ciprofloxacin Other (See Comments)    Unknown Unknown  . Erythromycin Other (See Comments)    Upset stomach Upset stomach    Patient Measurements: Height: 5\' 6"  (167.6 cm) Weight: 204 lb 12.9 oz (92.9 kg) IBW/kg (Calculated) : 63.8  Vital Signs: Temp: 98.5 F (36.9 C) (03/16 0915) Temp Source: Oral (03/16 0915) BP: 107/69 (03/16 0915) Pulse Rate: 108 (03/16 0915)  Labs: Recent Labs    05/13/19 1950 05/13/19 1950 05/14/19 0255 05/14/19 0901 05/14/19 0901 05/14/19 0936 05/14/19 1104 05/14/19 1948 05/15/19 0309 05/15/19 1141  HGB 11.7*   < > 10.5*  --   --   --   --   --  10.4*  --   HCT 37.5*  --  33.2*  --   --   --   --   --  33.3*  --   PLT 391  --  367  --   --   --   --   --  381  --   APTT 46*   < >  --  51*   < >  --   --  49* 66* 60*  LABPROT 23.0*  --  21.8*  --   --   --   --   --  15.4*  --   INR 2.1*  --  1.9*  --   --   --   --   --  1.2  --   HEPARINUNFRC  --   --   --  >2.20*  --   --   --   --   --   --   CREATININE 1.14  --  1.05  --   --   --   --   --  0.99  --   TROPONINIHS  --   --   --   --   --  18* 16  --   --   --    < > = values in this interval not displayed.    Estimated Creatinine Clearance: 55 mL/min (by C-G formula based on SCr of 0.99 mg/dL).   Medical History: Past Medical History:  Diagnosis Date  . Abdominal pain   . Acute renal failure ([place])     resolved  . Arthritis    "hands & legs" (11/'10/2014)  . Ataxia   . Atrial fibrillation ([place])   . Bladder outlet obstruction   . Bladder outlet obstruction   . BPH (benign prostatic hyperplasia)   . CAD (coronary artery disease)    a. CABG IN 1989. b. 01/08/2015 CTO of ost LAD, LIMA to LAD not visualized but assumed patent given myoview finding, occluded SVG to  diagonal, 99% mid RCA tx w/ SYNERGY DES 3X28 mm  . Constipation   . Diabetes mellitus type 2, insulin dependent ([place])   . Epistaxis, recurrent Feb 2017  . GERD (gastroesophageal reflux disease)   . HTN (hypertension)   . Hx of bacterial pneumonia   . Hyperlipemia   . Kidney stones   . Mild aortic stenosis   . Pneumonia 04/2019  . Rib fractures    left rib fractures being treated with pain medications  . Stented coronary artery Nov 2016   RCA DES  . Urinary tract infection     Enterococcus  Assessment: 86 y/o M presents to the ED with PNA/pleural effusion. On apxiaban PTA for afib. Holding apixaban and starting heparin in case pt needs thoracentesis/chest tube. Apixaban has been bridged with IV heparin for his thoracentesis. He is s/p thoracentesis now. Cards stated that apixaban can be resumed back if ok with TRH. D/w Dr. Marthenia Rolling and we will resume apixaban.    Goal of Therapy:  Monitor platelets by anticoagulation protocol: Yes   Plan:   Dc heparin Resume apixaban 5mg  PO BID Monitor for bleeding  Onnie Boer, PharmD, BCIDP, AAHIVP, CPP Infectious Disease Pharmacist 05/15/2019 1:04 PM

## 2019-05-16 ENCOUNTER — Inpatient Hospital Stay (HOSPITAL_COMMUNITY): Payer: Medicare Other

## 2019-05-16 ENCOUNTER — Telehealth: Payer: Self-pay | Admitting: Family Medicine

## 2019-05-16 ENCOUNTER — Other Ambulatory Visit: Payer: Self-pay

## 2019-05-16 DIAGNOSIS — Z794 Long term (current) use of insulin: Secondary | ICD-10-CM

## 2019-05-16 DIAGNOSIS — M79671 Pain in right foot: Secondary | ICD-10-CM

## 2019-05-16 DIAGNOSIS — E119 Type 2 diabetes mellitus without complications: Secondary | ICD-10-CM

## 2019-05-16 LAB — GLUCOSE, CAPILLARY
Glucose-Capillary: 132 mg/dL — ABNORMAL HIGH (ref 70–99)
Glucose-Capillary: 143 mg/dL — ABNORMAL HIGH (ref 70–99)
Glucose-Capillary: 275 mg/dL — ABNORMAL HIGH (ref 70–99)
Glucose-Capillary: 250 mg/dL — ABNORMAL HIGH (ref 70–99)

## 2019-05-16 LAB — CBC
HCT: 32.3 % — ABNORMAL LOW (ref 39.0–52.0)
Hemoglobin: 10.2 g/dL — ABNORMAL LOW (ref 13.0–17.0)
MCH: 26.2 pg (ref 26.0–34.0)
MCHC: 31.6 g/dL (ref 30.0–36.0)
MCV: 82.8 fL (ref 80.0–100.0)
Platelets: 381 10*3/uL (ref 150–400)
RBC: 3.9 MIL/uL — ABNORMAL LOW (ref 4.22–5.81)
RDW: 16.3 % — ABNORMAL HIGH (ref 11.5–15.5)
WBC: 18.9 10*3/uL — ABNORMAL HIGH (ref 4.0–10.5)
nRBC: 0 % (ref 0.0–0.2)

## 2019-05-16 LAB — ACID FAST SMEAR (AFB, MYCOBACTERIA): Acid Fast Smear: NEGATIVE

## 2019-05-16 MED ORDER — LIDOCAINE VISCOUS HCL 2 % MT SOLN
15.0000 mL | Freq: Four times a day (QID) | OROMUCOSAL | Status: DC | PRN
  Administered 2019-05-16 – 2019-05-17 (×3): 15 mL via ORAL
  Filled 2019-05-16 (×3): qty 15

## 2019-05-16 MED ORDER — METOPROLOL SUCCINATE ER 50 MG PO TB24
50.0000 mg | ORAL_TABLET | Freq: Every day | ORAL | Status: DC
  Administered 2019-05-16: 50 mg via ORAL
  Filled 2019-05-16: qty 1

## 2019-05-16 MED ORDER — DICLOFENAC SODIUM 1 % EX GEL
2.0000 g | Freq: Four times a day (QID) | CUTANEOUS | Status: DC
  Administered 2019-05-16 – 2019-05-17 (×7): 2 g via TOPICAL
  Filled 2019-05-16: qty 100

## 2019-05-16 MED ORDER — LIDOCAINE VISCOUS HCL 2 % MT SOLN: 15.0000 mL | Freq: Four times a day (QID) | OROMUCOSAL | Status: DC | PRN

## 2019-05-16 MED ORDER — ALUM & MAG HYDROXIDE-SIMETH 200-200-20 MG/5ML PO SUSP: 30.0000 mL | Freq: Once | ORAL | Status: DC

## 2019-05-16 MED ORDER — ALUM & MAG HYDROXIDE-SIMETH 200-200-20 MG/5ML PO SUSP: 30.0000 mL | Freq: Four times a day (QID) | ORAL | Status: DC | PRN

## 2019-05-16 MED ORDER — PRECISION XTRA BLOOD GLUCOSE VI STRP: ORAL_STRIP | 6 refills | Status: DC

## 2019-05-16 NOTE — Telephone Encounter (Signed)
Pt wife called and asked for Dr. Jonni Sanger to called her ASAP. Pt is in hospital and wife wants to speak to Dr. Jonni Sanger about what is going on and get more information. Please advise.

## 2019-05-16 NOTE — Progress Notes (Signed)
Progress Note  Patient Name: [name] Date of Encounter: 05/16/2019  Primary Cardiologist: Peter Martinique, MD   Subjective   Complains of severe pain on the bottom of his right foot and cannot bear weight.  Denies any CP or SOB.  HR in the upper 90's on tele in afib  Inpatient Medications    Scheduled Meds: . apixaban  5 mg Oral BID  . diltiazem  360 mg Oral Daily  . insulin aspart  0-5 Units Subcutaneous QHS  . insulin aspart  0-9 Units Subcutaneous TID WC  . insulin glargine  20 Units Subcutaneous Daily  . metoprolol succinate  100 mg Oral Daily  . sodium chloride flush  3 mL Intravenous Q12H  . sodium chloride flush  3 mL Intravenous Q12H   Continuous Infusions: . sodium chloride    . azithromycin 500 mg (05/15/19 2211)  . cefTRIAXone (ROCEPHIN)  IV 2 g (05/15/19 2044)   PRN Meds: sodium chloride, acetaminophen **OR** acetaminophen, alum & mag hydroxide-simeth **AND** lidocaine, guaiFENesin-dextromethorphan, lidocaine (PF), ondansetron **OR** ondansetron (ZOFRAN) IV, oxyCODONE-acetaminophen **AND** oxyCODONE, phenol, sodium chloride flush   Vital Signs    Vitals:   05/15/19 0915 05/15/19 1653 05/15/19 2208 05/16/19 0500  BP: 107/69 134/85 133/65   Pulse: (!) 108 (!) 106 88   Resp: 20 18    Temp: 98.5 F (36.9 C) 98 F (36.7 C) 97.6 F (36.4 C)   TempSrc: Oral  Oral   SpO2: 99% 97% 98%   Weight:    93.2 kg  Height:        Intake/Output Summary (Last 24 hours) at 05/16/2019 [id number] Last data filed at 05/15/2019 2300 Gross per 24 hour  Intake 590 ml  Output 175 ml  Net 415 ml   Filed Weights   05/14/19 0050 05/15/19 0500 05/16/19 0500  Weight: 89.5 kg 92.9 kg 93.2 kg    Telemetry    Atrial fibrillation with HR in the upper 90's at rest - Personally Reviewed  ECG    No new EKG to review- Personally Reviewed  Physical Exam   GEN: Well nourished, well developed in no acute distress HEENT: Normal NECK: No JVD; No carotid bruits LYMPHATICS: No  lymphadenopathy CARDIAC:irregularly irregular, no murmurs, rubs, gallops RESPIRATORY:  Clear to auscultation without rales, wheezing or rhonchi  ABDOMEN: Soft, non-tender, non-distended MUSCULOSKELETAL:  No edema; No deformity  SKIN: Warm and dry.  Right plantar surface of foot warm to touch and appears mildly swollen NEUROLOGIC:  Alert and oriented x 3 PSYCHIATRIC:  Normal affect    Labs    Chemistry Recent Labs  Lab 05/13/19 1950 05/14/19 0255 05/15/19 0309  NA 136 138 138  K 3.5 3.0* 3.7  CL 100 102 102  CO2 22 23 25   GLUCOSE 245* 175* 148*  BUN 16 15 11   CREATININE 1.14 1.05 0.99  CALCIUM 8.6* 8.4* 8.7*  PROT 5.7*  --   --   ALBUMIN 2.7*  --  2.2*  AST 19  --   --   ALT 15  --   --   ALKPHOS 75  --   --   BILITOT 1.2  --   --   GFRNONAA 57* >60 >60  GFRAA >60 >60 >60  ANIONGAP 14 13 11      Hematology Recent Labs  Lab 05/14/19 0255 05/15/19 0309 05/16/19 0135  WBC 18.5* 18.7* 18.9*  RBC 4.02* 4.04* 3.90*  HGB 10.5* 10.4* 10.2*  HCT 33.2* 33.3* 32.3*  MCV 82.6 82.4 82.8  MCH 26.1 25.7* 26.2  MCHC 31.6 31.2 31.6  RDW 15.9* 16.0* 16.3*  PLT 367 381 381    Cardiac EnzymesNo results for input(s): TROPONINI in the last 168 hours. No results for input(s): TROPIPOC in the last 168 hours.   BNP Recent Labs  Lab 05/14/19 0936  BNP 272.9*     DDimer No results for input(s): DDIMER in the last 168 hours.   Radiology    DG Chest 1 View  Result Date: 05/14/2019 CLINICAL DATA:  Post left thoracentesis EXAM: CHEST  1 VIEW COMPARISON:  05/13/2019 FINDINGS: Decreased left pleural effusion post thoracentesis. No pneumothorax. Improved left lung aeration with residual left basilar atelectasis. Right lung remains clear. Stable cardiomediastinal contours. IMPRESSION: Decreased left pleural effusion post thoracentesis. No pneumothorax. Residual left basilar atelectasis. Electronically Signed   By: Macy Mis M.D.   On: 05/14/2019 13:37   DG Chest 2  View  Result Date: 05/15/2019 CLINICAL DATA:  Admitted for shortness of breath. Pneumonia. EXAM: CHEST - 2 VIEW COMPARISON:  05/14/2019 FINDINGS: Status post median sternotomy and CABG procedure. Cardiac enlargement. Aortic atherosclerosis. Moderate left pleural effusion. Left midlung and left base airspace opacities are unchanged. Right lung is clear. IMPRESSION: 1. Persistent left pleural effusion with left midlung and left base opacities. 2.  Aortic Atherosclerosis (ICD10-I70.0). Electronically Signed   By: Kerby Moors M.D.   On: 05/15/2019 11:39   ECHOCARDIOGRAM COMPLETE  Result Date: 05/14/2019    ECHOCARDIOGRAM REPORT   Patient Name:   [name] Date of Exam: 05/14/2019 Medical Rec #:  [id number]       Height:       66.0 in Accession #:    [id number]      Weight:       197.3 lb Date of Birth:  September 23, 1928       BSA:          1.988 m Patient Age:    16 years        BP:           125/66 mmHg Patient Gender: M               HR:           96 bpm. Exam Location:  Inpatient Procedure: 2D Echo, Cardiac Doppler and Color Doppler Indications:    I35.0 Nonrheumatic aortic (valve) stenosis  History:        Patient has prior history of Echocardiogram examinations, most                 recent 10/10/2018. CHF, CAD, Abnormal ECG and Prior CABG, Stroke;                 Risk Factors:Hypertension and Diabetes. Edema. Post                 thoracentsis.  Sonographer:    Roseanna Rainbow RDCS Referring Phys: Lane  Sonographer Comments: Technically difficult study due to poor echo windows. IMPRESSIONS  1. Left ventricular ejection fraction, by estimation, is 60 to 65%. The left ventricle has normal function. The left ventricle has no regional wall motion abnormalities. There is moderate concentric left ventricular hypertrophy. Left ventricular diastolic parameters are indeterminate.  2. Right ventricular systolic function is normal. The right ventricular size is normal.  3. The mitral valve is normal in  structure. No evidence of mitral valve regurgitation. No evidence of mitral stenosis.  4. The aortic valve is normal in structure. Aortic valve regurgitation  is mild. Moderate aortic valve stenosis. Aortic valve area, by VTI measures 1.27 cm. Aortic valve mean gradient measures 19.3 mmHg. Aortic valve Vmax measures 2.75 m/s.  5. Compared with th echo 09/2018, ascending aorta has increased from 4.3 cm to 4.8cm. . Aortic dilatation noted. There is moderate dilatation of the ascending aorta measuring 48 mm.  6. The inferior vena cava is normal in size with greater than 50% respiratory variability, suggesting right atrial pressure of 3 mmHg. FINDINGS  Left Ventricle: Left ventricular ejection fraction, by estimation, is 60 to 65%. The left ventricle has normal function. The left ventricle has no regional wall motion abnormalities. The left ventricular internal cavity size was normal in size. There is  moderate concentric left ventricular hypertrophy. Left ventricular diastolic parameters are indeterminate. Right Ventricle: The right ventricular size is normal. No increase in right ventricular wall thickness. Right ventricular systolic function is normal. Left Atrium: Left atrial size was normal in size. Right Atrium: Right atrial size was normal in size. Pericardium: There is no evidence of pericardial effusion. Mitral Valve: The mitral valve is normal in structure. Normal mobility of the mitral valve leaflets. No evidence of mitral valve regurgitation. No evidence of mitral valve stenosis. Tricuspid Valve: The tricuspid valve is normal in structure. Tricuspid valve regurgitation is mild . No evidence of tricuspid stenosis. Aortic Valve: The aortic valve is normal in structure.. There is moderate thickening and moderate calcification of the aortic valve. Aortic valve regurgitation is mild. Moderate aortic stenosis is present. There is moderate thickening of the aortic valve. There is moderate calcification of the aortic  valve. Aortic valve mean gradient measures 19.3 mmHg. Aortic valve peak gradient measures 30.3 mmHg. Aortic valve area, by VTI measures 1.27 cm. Pulmonic Valve: The pulmonic valve was normal in structure. Pulmonic valve regurgitation is not visualized. No evidence of pulmonic stenosis. Aorta: Compared with th echo 09/2018, ascending aorta has increased from 4.3 cm to 4.8cm. The aortic root is normal in size and structure and aortic dilatation noted. There is moderate dilatation of the ascending aorta measuring 48 mm. Venous: The inferior vena cava was not well visualized. The inferior vena cava is normal in size with greater than 50% respiratory variability, suggesting right atrial pressure of 3 mmHg. IAS/Shunts: No atrial level shunt detected by color flow Doppler.  LEFT VENTRICLE PLAX 2D LVIDd:         4.90 cm LVIDs:         2.60 cm LV PW:         1.30 cm LV IVS:        1.50 cm LVOT diam:     2.20 cm LV SV:         65 LV SV Index:   33 LVOT Area:     3.80 cm  LV Volumes (MOD) LV vol d, MOD A2C: 56.1 ml LV vol d, MOD A4C: 43.4 ml LV vol s, MOD A2C: 23.6 ml LV vol s, MOD A4C: 20.7 ml LV SV MOD A2C:     32.5 ml LV SV MOD A4C:     43.4 ml LV SV MOD BP:      26.7 ml RIGHT VENTRICLE         IVC TAPSE (M-mode): 1.3 cm  IVC diam: 2.50 cm LEFT ATRIUM         Index LA diam:    4.55 cm 2.29 cm/m  AORTIC VALVE AV Area (Vmax):    1.45 cm AV Area (Vmean):   1.14  cm AV Area (VTI):     1.27 cm AV Vmax:           275.33 cm/s AV Vmean:          204.667 cm/s AV VTI:            0.513 m AV Peak Grad:      30.3 mmHg AV Mean Grad:      19.3 mmHg LVOT Vmax:         105.00 cm/s LVOT Vmean:        61.500 cm/s LVOT VTI:          0.172 m LVOT/AV VTI ratio: 0.34  AORTA Ao Root diam: 3.90 cm Ao Asc diam:  4.80 cm MITRAL VALVE                TRICUSPID VALVE MV Area (PHT): 3.29 cm     TR Peak grad:   24.2 mmHg MV Decel Time: 231 msec     TR Vmax:        246.00 cm/s MV E velocity: 113.50 cm/s                             SHUNTS                              Systemic VTI:  0.17 m                             Systemic Diam: 2.20 cm Skeet Latch MD Electronically signed by Skeet Latch MD Signature Date/Time: 05/14/2019/6:52:07 PM    Final    IR THORACENTESIS ASP PLEURAL SPACE W/IMG GUIDE  Result Date: 05/14/2019 INDICATION: Patient with history of pneumonia, now with left pleural effusion. Request is made for diagnostic and therapeutic left thoracentesis. EXAM: ULTRASOUND GUIDED DIAGNOSTIC AND THERAPEUTIC LEFT THORACENTESIS MEDICATIONS: 10 mL 1% lidocaine COMPLICATIONS: None immediate. PROCEDURE: An ultrasound guided thoracentesis was thoroughly discussed with the patient and questions answered. The benefits, risks, alternatives and complications were also discussed. The patient understands and wishes to proceed with the procedure. Written consent was obtained. Ultrasound was performed to localize and mark an adequate pocket of fluid in the left chest. The area was then prepped and draped in the normal sterile fashion. 1% Lidocaine was used for local anesthesia. Under ultrasound guidance a 6 Fr Safe-T-Centesis catheter was introduced. Thoracentesis was performed. The catheter was removed and a dressing applied. FINDINGS: A total of approximately 900 mL of amber fluid was removed. Samples were sent to the laboratory as requested by the clinical team. IMPRESSION: Successful ultrasound guided diagnostic and therapeutic left thoracentesis yielding 900 mL of pleural fluid. Read by: Brynda Greathouse [name] Electronically Signed   By: Lucrezia Europe M.D.   On: 05/14/2019 13:50    Cardiac Studies   2D echo 05/14/2019 IMPRESSIONS   1. Left ventricular ejection fraction, by estimation, is 60 to 65%. The  left ventricle has normal function. The left ventricle has no regional  wall motion abnormalities. There is moderate concentric left ventricular  hypertrophy. Left ventricular  diastolic parameters are indeterminate.  2. Right ventricular systolic  function is normal. The right ventricular  size is normal.  3. The mitral valve is normal in structure. No evidence of mitral valve  regurgitation. No evidence of mitral stenosis.  4. The aortic valve is normal in structure.  Aortic valve regurgitation is  mild. Moderate aortic valve stenosis. Aortic valve area, by VTI measures  1.27 cm. Aortic valve mean gradient measures 19.3 mmHg. Aortic valve Vmax  measures 2.75 m/s.  5. Compared with th echo 09/2018, ascending aorta has increased from 4.3  cm to 4.8cm. . Aortic dilatation noted. There is moderate dilatation of  the ascending aorta measuring 48 mm.  6. The inferior vena cava is normal in size with greater than 50%  respiratory variability, suggesting right atrial pressure of 3 mmHg.   Patient Profile     86 y.o. male with a hx of persistent atrial fibrillation on Eliquis, CAD s/p CABG in 1990 and DES to RCA in 2006, CVA in April [id number], chronic diastolic HF ([id number]), moderate AS, remote tobacco use, HTN, DM2, HLD, and right carotid stenosis 60% who is being seen  for the evaluation of Afib RVR at the request of Dr. Marthenia Rolling.  Assessment & Plan    1.  Sepsis/PNA/left large pleural effusion -Cxray with LLL PNA and pleural effusion -antibx per THR -s/p thoracentesis of 1L amber pleural fluid today and feels much better -lungs clear this am and SOB significantly improved -COVID negative  2.  Permanent atrial fibrillation -HR has been elevated likely related to fever, sepsis as well as reduction in home BB dose due to soft BP -Started back on Toprol XL yesterday at 100mg  daily (home dose BID) -HR still in the upper 90's to low 100's -continue Cardizem 360mg  daily -Increase Toprol to 100mg  qam and 50mg  qpm - if BP stable with this then will increase back to his home dose of Toprol XL 100mg  BID tomorrow -continue apixaban 5mg  BID  3.  ASCAD -s/p remote CABG in 1990 and PCI of mRCA in 2006 -he denies any anginal sx - just  pleuritic pain related to pleural effusion that has improved after thoracentesis -2D echo with normal LVF EF 60-65% -hsTrop normal at 18>16 -denies any anginal sx -continue statin and BB -no ASA due to DOAC  4.  Chronic diastolic CHF -appears euvolemic on exam -will hold off on restarting diuretic for now as he does not appear volume overloaded and do not want to drop BP while needing higher doses of BB for rate control  5.  HTN -BP stable at 133/31mmHg -continue Cardizem -slowly titrating Toprol back to home dose - see above will increase to 100mg  qam and 50mg  qpm for HR control   6.  Aortic Stenosis -moderate by echo with mean AVG  19.88mmHg, Vmax  2.62m/s and AVA 1.27cm2.   -continue to follow yearly  7.  Ascending aortic aneurysm -measures 2mm on echo which has increased from 4.3 in August -this will need to be followed up on outpt -continue BB and statin  8.  Wide complex tachycardia -15 beats on tele -? NSVT vs. Aberration in setting of afib -no further episodes on tele in past 24 hours -continue BB -LVF normal   For questions or updates, please contact [place] Please consult [email or web address] for contact info under Cardiology/STEMI.      Signed, Fransico Him, MD  05/16/2019, 7:23 AM

## 2019-05-16 NOTE — Telephone Encounter (Signed)
LMOM  No answer

## 2019-05-16 NOTE — Progress Notes (Addendum)
PROGRESS NOTE  [name] [id number] DOB: 1929-01-04 DOA: 05/13/2019 PCP: [name], MD   LOS: 3 days   Brief Narrative / Interim history: 86 year old male with CAD status post PCI/CABG, aortic stenosis, nephrolithiasis, hypertension, hyperlipidemia, DM 2, chronic A. fib on Eliquis came in with a week history of a viral syndrome with sore throat which started at the beginning of March, runny nose, chest congestion and nonproductive cough.  He denies any fever or chills.  On admission he was having significant leukocytosis, chest x-ray showed left lower lobe infiltrate with left-sided pleural effusion.  His Covid was negative and he was vaccinated with both doses in February.  He was admitted to the hospital and treated presumed for pneumonia.  Underwent left-sided thoracentesis on 3/16, fluid was exudative  Subjective / 24h Interval events: He is complaining of right foot pain of new onset that started this morning.  Patient was unable to ambulate due to severe pain.  He is also been complaining and is somewhat frustrated about his persistent sore throat  Assessment & Plan: Principal Problem Lobar pneumonia with associated large exudative left pleural effusion, acute hypoxic respiratory failure -Patient was admitted to the hospital with acute viral syndrome, significant leukocytosis, shortness of breath, chest x-ray did show left lower lobe infiltrate with left sided pleural effusion -He was placed on IV vancomycin initially which has now been discontinued -He was also placed on ceftriaxone and azithromycin, continue, plan for total of 5-7 days depending on clinical improvement/resolution of his WBC -Covid test was negative -Status post IR thoracentesis, follow on cultures -Still has a sore throat, I have added lidocaine for symptomatic treatment  Active Problems A. fib with RVR -Rate controlled, continue beta-blocker, Cardizem, cardiology has been consulted and are  following -Continue anticoagulation with Eliquis -Cardiology uptitrating metoprolol today  Right foot pain -Relatively sudden onset, no trauma, ankle has full range of motion states pain is mainly in the midfoot.  He has good peripheral pulses, foot is warm, do not suspect any vascular issues -Very tender to palpation, obtained plain x-rays today which are negative for fractures. -Symptomatic treatment with topical NSAIDs, PT consulted, may need a boot to assist with ambulation.  He has no history of gout  Coronary artery disease -No chest pain, troponin negative, cardiology following  Chronic diastolic CHF -Appears compensated  Insulin-dependent diabetes mellitus -A1c was 8.5 in December 2020, somewhat reasonable in his age group.  Continue Lantus and sliding scale  History of mild to moderate aortic stenosis mild dilatation of the ascending aorta -2D echo done on 3/15 showed EF of 60 to 65%, mild aortic valve regurgitation and moderate stenosis, valve area 1.27 cm2  Scheduled Meds: . apixaban  5 mg Oral BID  . diltiazem  360 mg Oral Daily  . insulin aspart  0-5 Units Subcutaneous QHS  . insulin aspart  0-9 Units Subcutaneous TID WC  . insulin glargine  20 Units Subcutaneous Daily  . metoprolol succinate  100 mg Oral Daily  . metoprolol succinate  50 mg Oral QHS  . sodium chloride flush  3 mL Intravenous Q12H  . sodium chloride flush  3 mL Intravenous Q12H   Continuous Infusions: . sodium chloride    . azithromycin Stopped (05/16/19 0700)  . cefTRIAXone (ROCEPHIN)  IV Stopped (05/16/19 0700)   PRN Meds:.sodium chloride, acetaminophen **OR** acetaminophen, alum & mag hydroxide-simeth **AND** lidocaine, guaiFENesin-dextromethorphan, lidocaine (PF), ondansetron **OR** ondansetron (ZOFRAN) IV, oxyCODONE-acetaminophen **AND** oxyCODONE, phenol, sodium chloride flush  DVT prophylaxis: On Eliquis  Code Status: Full code Family Communication: We will update wife in the  afternoon Patient admitted from: Home Anticipated d/c place: Home Barriers to d/c: Medication changes by cardiology, new onset right foot pain being worked up  Consultants:  Cardiology  Procedures:  2D echo:  1. Left ventricular ejection fraction, by estimation, is 60 to 65%. The  left ventricle has normal function. The left ventricle has no regional  wall motion abnormalities. There is moderate concentric left ventricular  hypertrophy. Left ventricular  diastolic parameters are indeterminate.  2. Right ventricular systolic function is normal. The right ventricular  size is normal.  3. The mitral valve is normal in structure. No evidence of mitral valve  regurgitation. No evidence of mitral stenosis.  4. The aortic valve is normal in structure. Aortic valve regurgitation is  mild. Moderate aortic valve stenosis. Aortic valve area, by VTI measures  1.27 cm. Aortic valve mean gradient measures 19.3 mmHg. Aortic valve Vmax  measures 2.75 m/s.  5. Compared with th echo 09/2018, ascending aorta has increased from 4.3  cm to 4.8cm. . Aortic dilatation noted. There is moderate dilatation of  the ascending aorta measuring 48 mm.  6. The inferior vena cava is normal in size with greater than 50%  respiratory variability, suggesting right atrial pressure of 3 mmHg.   Microbiology  No growth to date  Antimicrobials: Ceftriaxone/azithromycin 3/14 >> today is day 4   Objective: Vitals:   05/15/19 1653 05/15/19 2208 05/16/19 0500 05/16/19 0930  BP: 134/85 133/65  (!) 119/42  Pulse: (!) 106 88  98  Resp: 18   20  Temp: 98 F (36.7 C) 97.6 F (36.4 C)  97.6 F (36.4 C)  TempSrc:  Oral  Oral  SpO2: 97% 98%  96%  Weight:   93.2 kg   Height:        Intake/Output Summary (Last 24 hours) at 05/16/2019 1301 Last data filed at 05/15/2019 2300 Gross per 24 hour  Intake 590 ml  Output 175 ml  Net 415 ml   Filed Weights   05/14/19 0050 05/15/19 0500 05/16/19 0500  Weight: 89.5  kg 92.9 kg 93.2 kg    Examination:  Constitutional: NAD Eyes: no scleral icterus ENMT: Mucous membranes are moist.  Normal oropharynx, no erythema, no exudates, no thrush Neck: normal, supple Respiratory: Diminished at the bases but overall clear without wheezing or crackles, normal respiratory effort Cardiovascular: Irregular, no edema, good pulses Abdomen: non distended, no tenderness. Bowel sounds positive.  Musculoskeletal: no clubbing / cyanosis.  Right foot extremely tender to palpation midfoot, no erythema or obvious deformities noted Skin: no rashes Neurologic: CN 2-12 grossly intact. Strength 5/5 in all 4.   Data Reviewed: I have independently reviewed following labs and imaging studies   CBC: Recent Labs  Lab 05/13/19 1950 05/14/19 0255 05/15/19 0309 05/16/19 0135  WBC 20.4* 18.5* 18.7* 18.9*  NEUTROABS 17.0* 16.1* 14.6*  --   HGB 11.7* 10.5* 10.4* 10.2*  HCT 37.5* 33.2* 33.3* 32.3*  MCV 83.1 82.6 82.4 82.8  PLT 391 367 381 [id number]   Basic Metabolic Panel: Recent Labs  Lab 05/13/19 1950 05/14/19 0255 05/14/19 1319 05/15/19 0309  NA 136 138  --  138  K 3.5 3.0*  --  3.7  CL 100 102  --  102  CO2 22 23  --  25  GLUCOSE 245* 175*  --  148*  BUN 16 15  --  11  CREATININE 1.14 1.05  --  0.99  CALCIUM 8.6* 8.4*  --  8.7*  MG  --  1.8  --  2.0  PHOS  --   --  3.5 3.3   Liver Function Tests: Recent Labs  Lab 05/13/19 1950 05/15/19 0309  AST 19  --   ALT 15  --   ALKPHOS 75  --   BILITOT 1.2  --   PROT 5.7*  --   ALBUMIN 2.7* 2.2*   Coagulation Profile: Recent Labs  Lab 05/13/19 1950 05/14/19 0255 05/15/19 0309  INR 2.1* 1.9* 1.2   HbA1C: No results for input(s): HGBA1C in the last 72 hours. CBG: Recent Labs  Lab 05/15/19 1219 05/15/19 1727 05/15/19 2149 05/16/19 0813 05/16/19 1152  GLUCAP 166* 138* 128* 132* 143*    Recent Results (from the past 240 hour(s))  Blood Culture (routine x 2)     Status: None (Preliminary result)    Collection Time: 05/13/19  7:51 PM   Specimen: BLOOD LEFT FOREARM  Result Value Ref Range Status   Specimen Description BLOOD LEFT FOREARM  Final   Special Requests   Final    BOTTLES DRAWN AEROBIC AND ANAEROBIC Blood Culture adequate volume   Culture   Final    NO GROWTH 3 DAYS Performed at [facility], [place] [address] [place], [place] [address]    Report Status PENDING  Incomplete  Urine culture     Status: Abnormal   Collection Time: 05/13/19  8:48 PM   Specimen: Urine, Clean Catch  Result Value Ref Range Status   Specimen Description URINE, CLEAN CATCH  Final   Special Requests NONE  Final   Culture (A)  Final    <10,000 COLONIES/mL INSIGNIFICANT GROWTH Performed at [facility], [place] [address] [place], [place] [address]    Report Status 05/15/2019 FINAL  Final  Blood Culture (routine x 2)     Status: None (Preliminary result)   Collection Time: 05/13/19  9:25 PM   Specimen: BLOOD  Result Value Ref Range Status   Specimen Description BLOOD RIGHT ARM  Final   Special Requests   Final    BOTTLES DRAWN AEROBIC AND ANAEROBIC Blood Culture adequate volume   Culture   Final    NO GROWTH 3 DAYS Performed at [facility], [place] [address] [place], [place] [address]    Report Status PENDING  Incomplete  Respiratory Panel by RT PCR (Flu A&B, Covid) - Nasopharyngeal Swab     Status: None   Collection Time: 05/13/19  9:39 PM   Specimen: Nasopharyngeal Swab  Result Value Ref Range Status   SARS Coronavirus 2 by RT PCR NEGATIVE NEGATIVE Final    Comment: (NOTE) SARS-CoV-2 target nucleic acids are NOT DETECTED. The SARS-CoV-2 RNA is generally detectable in upper respiratoy specimens during the acute phase of infection. The lowest concentration of SARS-CoV-2 viral copies this assay can detect is 131 copies/mL. A negative result does not preclude SARS-Cov-2 infection and should not be used as the sole basis for treatment or other patient management decisions. A  negative result may occur with  improper specimen collection/handling, submission of specimen other than nasopharyngeal swab, presence of viral mutation(s) within the areas targeted by this assay, and inadequate number of viral copies (<131 copies/mL). A negative result must be combined with clinical observations, patient history, and epidemiological information. The expected result is Negative. Fact Sheet for Patients:  PinkCheek.be Fact Sheet for Healthcare Providers:  GravelBags.it This test is not yet ap proved or  cleared by the [place] and  has been authorized for detection and/or diagnosis of SARS-CoV-2 by FDA under an Emergency Use Authorization (EUA). This EUA will remain  in effect (meaning this test can be used) for the duration of the COVID-19 declaration under Section 564(b)(1) of the Act, 21 U.S.C. section 360bbb-3(b)(1), unless the authorization is terminated or revoked sooner.    Influenza A by PCR NEGATIVE NEGATIVE Final   Influenza B by PCR NEGATIVE NEGATIVE Final    Comment: (NOTE) The Xpert Xpress SARS-CoV-2/FLU/RSV assay is intended as an aid in  the diagnosis of influenza from Nasopharyngeal swab specimens and  should not be used as a sole basis for treatment. Nasal washings and  aspirates are unacceptable for Xpert Xpress SARS-CoV-2/FLU/RSV  testing. Fact Sheet for Patients: PinkCheek.be Fact Sheet for Healthcare Providers: GravelBags.it This test is not yet approved or cleared by the [place] FDA and  has been authorized for detection and/or diagnosis of SARS-CoV-2 by  FDA under an Emergency Use Authorization (EUA). This EUA will remain  in effect (meaning this test can be used) for the duration of the  Covid-19 declaration under Section 564(b)(1) of the Act, 21  U.S.C. section 360bbb-3(b)(1), unless the authorization is   terminated or revoked. Performed at [facility], [place] [address], [place], [place] [address]   MRSA PCR Screening     Status: None   Collection Time: 05/14/19  8:47 AM   Specimen: Nasal Mucosa; Nasopharyngeal  Result Value Ref Range Status   MRSA by PCR NEGATIVE NEGATIVE Final    Comment:        The GeneXpert MRSA Assay (FDA approved for NASAL specimens only), is one component of a comprehensive MRSA colonization surveillance program. It is not intended to diagnose MRSA infection nor to guide or monitor treatment for MRSA infections. Performed at [facility], [place] [address], [place], [place] [address]   Respiratory Panel by PCR     Status: None   Collection Time: 05/14/19 10:10 AM  Result Value Ref Range Status   Adenovirus NOT DETECTED NOT DETECTED Final   Coronavirus 229E NOT DETECTED NOT DETECTED Final    Comment: (NOTE) The Coronavirus on the Respiratory Panel, DOES NOT test for the novel  Coronavirus (2019 nCoV)    Coronavirus HKU1 NOT DETECTED NOT DETECTED Final   Coronavirus NL63 NOT DETECTED NOT DETECTED Final   Coronavirus OC43 NOT DETECTED NOT DETECTED Final   Metapneumovirus NOT DETECTED NOT DETECTED Final   Rhinovirus / Enterovirus NOT DETECTED NOT DETECTED Final   Influenza A NOT DETECTED NOT DETECTED Final   Influenza B NOT DETECTED NOT DETECTED Final   Parainfluenza Virus 1 NOT DETECTED NOT DETECTED Final   Parainfluenza Virus 2 NOT DETECTED NOT DETECTED Final   Parainfluenza Virus 3 NOT DETECTED NOT DETECTED Final   Parainfluenza Virus 4 NOT DETECTED NOT DETECTED Final   Respiratory Syncytial Virus NOT DETECTED NOT DETECTED Final   Bordetella pertussis NOT DETECTED NOT DETECTED Final   Chlamydophila pneumoniae NOT DETECTED NOT DETECTED Final   Mycoplasma pneumoniae NOT DETECTED NOT DETECTED Final    Comment: Performed at [facility], [place]. [address] [place], [place] [address]  Acid Fast Smear (AFB)     Status: None   Collection  Time: 05/14/19  1:28 PM   Specimen: PATH Cytology Pleural fluid  Result Value Ref Range Status   AFB Specimen Processing Concentration  Final   Acid Fast Smear Negative  Final  Comment: (NOTE) Performed At: [facility] [place], [place] [id number] Rush Farmer MD [id number]    Source (AFB) PLEURAL  Final    Comment: FLUID Performed at [facility], [place] [address], [place], [place] [address]   Culture, body fluid-bottle     Status: None (Preliminary result)   Collection Time: 05/14/19  1:28 PM   Specimen: Pleura  Result Value Ref Range Status   Specimen Description PLEURAL FLUID  Final   Special Requests BOTTLES DRAWN AEROBIC AND ANAEROBIC 10CC  Final   Culture   Final    NO GROWTH 2 DAYS Performed at [facility], [place] [address] [place], [place] [address]    Report Status PENDING  Incomplete  Gram stain     Status: None   Collection Time: 05/14/19  1:28 PM   Specimen: Pleura  Result Value Ref Range Status   Specimen Description PLEURAL FLUID  Final   Special Requests NONE  Final   Gram Stain   Final    MODERATE WBC PRESENT, PREDOMINANTLY MONONUCLEAR NO ORGANISMS SEEN Performed at [facility], [place] [address] [place], [place] [address]    Report Status 05/14/2019 FINAL  Final     Radiology Studies: DG Foot Complete Right  Result Date: 05/16/2019 CLINICAL DATA:  Plantar foot pain for 3 days. EXAM: RIGHT FOOT COMPLETE - 3+ VIEW COMPARISON:  None. FINDINGS: There is no evidence of fracture or dislocation. There is no evidence of arthropathy or other focal bone abnormality. Soft tissues are unremarkable. IMPRESSION: Negative. Electronically Signed   By: Misty Stanley M.D.   On: 05/16/2019 09:53   Marzetta Board, MD, PhD [facility]  Between 7 am - 7 pm I am available, please contact me via Amion or Securechat  Between 7 pm - 7 am I am not available, please contact night coverage MD/APP via Amion

## 2019-05-17 ENCOUNTER — Other Ambulatory Visit: Payer: Self-pay | Admitting: Cardiology

## 2019-05-17 ENCOUNTER — Inpatient Hospital Stay (HOSPITAL_COMMUNITY): Payer: Medicare Other

## 2019-05-17 DIAGNOSIS — I10 Essential (primary) hypertension: Secondary | ICD-10-CM

## 2019-05-17 LAB — CBC
HCT: 33.9 % — ABNORMAL LOW (ref 39.0–52.0)
Hemoglobin: 10.8 g/dL — ABNORMAL LOW (ref 13.0–17.0)
MCH: 26.2 pg (ref 26.0–34.0)
MCHC: 31.9 g/dL (ref 30.0–36.0)
MCV: 82.3 fL (ref 80.0–100.0)
Platelets: 412 10*3/uL — ABNORMAL HIGH (ref 150–400)
RBC: 4.12 MIL/uL — ABNORMAL LOW (ref 4.22–5.81)
RDW: 16.1 % — ABNORMAL HIGH (ref 11.5–15.5)
WBC: 15.5 10*3/uL — ABNORMAL HIGH (ref 4.0–10.5)
nRBC: 0 % (ref 0.0–0.2)

## 2019-05-17 LAB — BASIC METABOLIC PANEL
Anion gap: 10 (ref 5–15)
BUN: 14 mg/dL (ref 8–23)
CO2: 24 mmol/L (ref 22–32)
Calcium: 8.4 mg/dL — ABNORMAL LOW (ref 8.9–10.3)
Chloride: 104 mmol/L (ref 98–111)
Creatinine, Ser: 0.84 mg/dL (ref 0.61–1.24)
GFR calc Af Amer: >60 mL/min (ref >60)
GFR calc non Af Amer: >60 mL/min (ref >60)
Glucose, Bld: 226 mg/dL — ABNORMAL HIGH (ref 70–99)
Potassium: 3.8 mmol/L (ref 3.5–5.1)
Sodium: 138 mmol/L (ref 135–145)

## 2019-05-17 LAB — CYTOLOGY - NON PAP

## 2019-05-17 LAB — GLUCOSE, CAPILLARY
Glucose-Capillary: 207 mg/dL — ABNORMAL HIGH (ref 70–99)
Glucose-Capillary: 258 mg/dL — ABNORMAL HIGH (ref 70–99)
Glucose-Capillary: 187 mg/dL — ABNORMAL HIGH (ref 70–99)
Glucose-Capillary: 264 mg/dL — ABNORMAL HIGH (ref 70–99)

## 2019-05-17 LAB — SURGICAL PATHOLOGY

## 2019-05-17 MED ORDER — LIDOCAINE HCL 2 % EX GEL
1.0000 "application " | Freq: Once | CUTANEOUS | Status: DC | PRN
  Filled 2019-05-17: qty 4250

## 2019-05-17 MED ORDER — SILVER NITRATE-POT NITRATE 75-25 % EX MISC
1.0000 | Freq: Once | CUTANEOUS | Status: DC | PRN
  Filled 2019-05-17: qty 1

## 2019-05-17 MED ORDER — GADOBUTROL 1 MMOL/ML IV SOLN
10.0000 mL | Freq: Once | INTRAVENOUS | Status: AC | PRN
  Administered 2019-05-17: 10 mL via INTRAVENOUS

## 2019-05-17 MED ORDER — LIDOCAINE-EPINEPHRINE (PF) 1 %-1:200000 IJ SOLN
0.0000 mL | Freq: Once | INTRAMUSCULAR | Status: DC | PRN
  Filled 2019-05-17: qty 30

## 2019-05-17 MED ORDER — TRIPLE ANTIBIOTIC 3.5-400-5000 EX OINT
1.0000 "application " | TOPICAL_OINTMENT | Freq: Once | CUTANEOUS | Status: DC | PRN
  Filled 2019-05-17: qty 1

## 2019-05-17 MED ORDER — OXYMETAZOLINE HCL 0.05 % NA SOLN
1.0000 | Freq: Once | NASAL | Status: DC | PRN
  Filled 2019-05-17: qty 30

## 2019-05-17 MED ORDER — METOPROLOL SUCCINATE ER 100 MG PO TB24
100.0000 mg | ORAL_TABLET | Freq: Two times a day (BID) | ORAL | Status: DC
  Administered 2019-05-17 – 2019-05-18 (×3): 100 mg via ORAL
  Filled 2019-05-17 (×3): qty 1

## 2019-05-17 MED ORDER — LIDOCAINE HCL 4 % EX SOLN
0.0000 mL | Freq: Once | CUTANEOUS | Status: DC | PRN
  Filled 2019-05-17: qty 50

## 2019-05-17 MED ORDER — BACITRACIN-NEOMYCIN-POLYMYXIN OINTMENT TUBE
TOPICAL_OINTMENT | Freq: Once | CUTANEOUS | Status: DC
  Filled 2019-05-17: qty 14

## 2019-05-17 MED ORDER — FUROSEMIDE 40 MG PO TABS
40.0000 mg | ORAL_TABLET | Freq: Every day | ORAL | Status: DC
  Administered 2019-05-17 – 2019-05-18 (×2): 40 mg via ORAL
  Filled 2019-05-17 (×2): qty 1

## 2019-05-17 MED ORDER — ATORVASTATIN CALCIUM 40 MG PO TABS
40.0000 mg | ORAL_TABLET | Freq: Every day | ORAL | Status: DC
  Administered 2019-05-17: 40 mg via ORAL
  Filled 2019-05-17: qty 1

## 2019-05-17 NOTE — Consult Note (Signed)
Reason for Consult: Sore throat Referring Physician: Caren Griffins, MD  [name] is an 86 y.o. male.  HPI: Patient in the hospital for pneumonia for the past couple of days.  Recovering well from that.  About a week prior to the pneumonia he developed a severe sore throat that was localized to the left side.  He had been treated with a couple of different over-the-counter medications without any relief.  He denies ear pain.  He is having difficulty swallowing because of the pain.  He points to the left side of the pharynx.  He denies any difficulty breathing.  No difficulty with his voice.  He has not smoked in about 60 years.  Past Medical History:  Diagnosis Date  . Abdominal pain   . Acute renal failure ([place])     resolved  . Arthritis    "hands & legs" (11/'10/2014)  . Ataxia   . Atrial fibrillation ([place])   . Bladder outlet obstruction   . Bladder outlet obstruction   . BPH (benign prostatic hyperplasia)   . CAD (coronary artery disease)    a. CABG IN 1989. b. 01/08/2015 CTO of ost LAD, LIMA to LAD not visualized but assumed patent given myoview finding, occluded SVG to diagonal, 99% mid RCA tx w/ SYNERGY DES 3X28 mm  . Constipation   . Diabetes mellitus type 2, insulin dependent ([place])   . Epistaxis, recurrent Feb 2017  . GERD (gastroesophageal reflux disease)   . HTN (hypertension)   . Hx of bacterial pneumonia   . Hyperlipemia   . Kidney stones   . Mild aortic stenosis   . Pneumonia 04/2019  . Rib fractures    left rib fractures being treated with pain medications  . Stented coronary artery Nov 2016   RCA DES  . Urinary tract infection     Enterococcus    Past Surgical History:  Procedure Laterality Date  . CARDIAC CATHETERIZATION  1989  . CARDIAC CATHETERIZATION N/A 01/08/2015   Procedure: Left Heart Cath and Cors/Grafts Angiography;  Surgeon: Peter M Martinique, MD;  Location: [facility];  Service: Cardiovascular;  Laterality: N/A;  . CARDIAC  CATHETERIZATION  01/08/2015   Procedure: Coronary Stent Intervention;  Surgeon: Peter M Martinique, MD;  Location: [facility];  Service: Cardiovascular;;  . CARPAL TUNNEL RELEASE Right 08/2010  . CATARACT EXTRACTION W/ INTRAOCULAR LENS  IMPLANT, BILATERAL Bilateral   . CORONARY ANGIOPLASTY  01/08/15   RCA DES  . CORONARY ARTERY BYPASS GRAFT  1989   "CABG X 2"  . IR THORACENTESIS ASP PLEURAL SPACE W/IMG GUIDE  05/14/2019  . JOINT REPLACEMENT    . TONSILLECTOMY    . TOTAL HIP ARTHROPLASTY Right 2000    Family History  Problem Relation Age of Onset  . Brain cancer Father   . Throat cancer Brother     Social History:  reports that he quit smoking about 61 years ago. His smoking use included cigarettes. He has a 30.00 pack-year smoking history. He has never used smokeless tobacco. He reports that he does not drink alcohol or use drugs.  Allergies:  Allergies  Allergen Reactions  . Erythromycin Base   . Cefadroxil Other (See Comments)    Unknown Unknown  . Ciprofloxacin Other (See Comments)    Unknown Unknown  . Erythromycin Other (See Comments)    Upset stomach Upset stomach    Medications: Reviewed  Results for orders placed or performed during the hospital encounter of 05/13/19 (from  the past 48 hour(s))  Glucose, capillary     Status: Abnormal   Collection Time: 05/15/19  5:27 PM  Result Value Ref Range   Glucose-Capillary 138 (H) 70 - 99 mg/dL    Comment: Glucose reference range applies only to samples taken after fasting for at least 8 hours.  Glucose, capillary     Status: Abnormal   Collection Time: 05/15/19  9:49 PM  Result Value Ref Range   Glucose-Capillary 128 (H) 70 - 99 mg/dL    Comment: Glucose reference range applies only to samples taken after fasting for at least 8 hours.   Comment 1 Notify RN   CBC     Status: Abnormal   Collection Time: 05/16/19  1:35 AM  Result Value Ref Range   WBC 18.9 (H) 4.0 - 10.5 K/uL   RBC 3.90 (L) 4.22 - 5.81 MIL/uL    Hemoglobin 10.2 (L) 13.0 - 17.0 g/dL   HCT 32.3 (L) 39.0 - 52.0 %   MCV 82.8 80.0 - 100.0 fL   MCH 26.2 26.0 - 34.0 pg   MCHC 31.6 30.0 - 36.0 g/dL   RDW 16.3 (H) 11.5 - 15.5 %   Platelets 381 150 - 400 K/uL   nRBC 0.0 0.0 - 0.2 %    Comment: Performed at [facility], [place] [address], [place], [place] [address]  Glucose, capillary     Status: Abnormal   Collection Time: 05/16/19  8:13 AM  Result Value Ref Range   Glucose-Capillary 132 (H) 70 - 99 mg/dL    Comment: Glucose reference range applies only to samples taken after fasting for at least 8 hours.  Glucose, capillary     Status: Abnormal   Collection Time: 05/16/19 11:52 AM  Result Value Ref Range   Glucose-Capillary 143 (H) 70 - 99 mg/dL    Comment: Glucose reference range applies only to samples taken after fasting for at least 8 hours.  Culture, group A strep     Status: None (Preliminary result)   Collection Time: 05/16/19  4:20 PM   Specimen: Throat  Result Value Ref Range   Specimen Description THROAT    Special Requests NONE    Culture      CULTURE REINCUBATED FOR BETTER GROWTH Performed at [facility], [place] [address], [place], [place] [address]    Report Status PENDING   Glucose, capillary     Status: Abnormal   Collection Time: 05/16/19  5:50 PM  Result Value Ref Range   Glucose-Capillary 275 (H) 70 - 99 mg/dL    Comment: Glucose reference range applies only to samples taken after fasting for at least 8 hours.  Glucose, capillary     Status: Abnormal   Collection Time: 05/16/19  9:03 PM  Result Value Ref Range   Glucose-Capillary 250 (H) 70 - 99 mg/dL    Comment: Glucose reference range applies only to samples taken after fasting for at least 8 hours.  CBC     Status: Abnormal   Collection Time: 05/17/19  2:25 AM  Result Value Ref Range   WBC 15.5 (H) 4.0 - 10.5 K/uL   RBC 4.12 (L) 4.22 - 5.81 MIL/uL   Hemoglobin 10.8 (L) 13.0 - 17.0 g/dL   HCT 33.9 (L) 39.0 - 52.0 %   MCV 82.3 80.0 - 100.0 fL    MCH 26.2 26.0 - 34.0 pg   MCHC 31.9 30.0 - 36.0 g/dL   RDW 16.1 (H) 11.5 - 15.5 %  Platelets 412 (H) 150 - 400 K/uL   nRBC 0.0 0.0 - 0.2 %    Comment: Performed at [facility], [place] [address], [place], [place] [id number]  Basic metabolic panel     Status: Abnormal   Collection Time: 05/17/19  2:25 AM  Result Value Ref Range   Sodium 138 135 - 145 mmol/L   Potassium 3.8 3.5 - 5.1 mmol/L   Chloride 104 98 - 111 mmol/L   CO2 24 22 - 32 mmol/L   Glucose, Bld 226 (H) 70 - 99 mg/dL    Comment: Glucose reference range applies only to samples taken after fasting for at least 8 hours.   BUN 14 8 - 23 mg/dL   Creatinine, Ser 0.84 0.61 - 1.24 mg/dL   Calcium 8.4 (L) 8.9 - 10.3 mg/dL   GFR calc non Af Amer >60 >60 mL/min   GFR calc Af Amer >60 >60 mL/min   Anion gap 10 5 - 15    Comment: Performed at [facility] [address] [place], [place] [address]  Glucose, capillary     Status: Abnormal   Collection Time: 05/17/19  8:29 AM  Result Value Ref Range   Glucose-Capillary 207 (H) 70 - 99 mg/dL    Comment: Glucose reference range applies only to samples taken after fasting for at least 8 hours.    DG Foot Complete Right  Result Date: 05/16/2019 CLINICAL DATA:  Plantar foot pain for 3 days. EXAM: RIGHT FOOT COMPLETE - 3+ VIEW COMPARISON:  None. FINDINGS: There is no evidence of fracture or dislocation. There is no evidence of arthropathy or other focal bone abnormality. Soft tissues are unremarkable. IMPRESSION: Negative. Electronically Signed   By: Misty Stanley M.D.   On: 05/16/2019 09:53    [id number] except as listed in admit H&P  Blood pressure (!) 149/87, pulse (!) 104, temperature 97.8 F (36.6 C), resp. rate 20, height 5\' 6"  (1.676 m), weight 93.3 kg, SpO2 94 %.  PHYSICAL EXAM: Overall appearance:  Healthy appearing, in no distress.  Voice and breathing are clear. Head:  Normocephalic, atraumatic. Ears: External ears look healthy. Nose: External nose is  healthy in appearance. Internal nasal exam free of any lesions or obstruction. Oral Cavity/Pharynx:  There are no mucosal lesions or masses identified.  Tonsils are surgically absent.  In the midportion of the left tonsillar fossa is a small erythematous area without ulceration. Larynx/Hypopharynx: See below Neuro:  No identifiable neurologic deficits. Neck: No palpable neck masses.  Studies Reviewed: none  Procedures: Fiberoptic laryngoscopy was performed.  Topical Xylocaine/Afrin was sprayed to the nasal cavities.  The left side was used for the scope advancement.  The nasal cavity was clear.  Nasal mucosa looks healthy.  No drainage or infection.  Nasopharynx was clear.  Oropharynx clear.  Hypopharynx and larynx completely normal.  No evidence of tongue base mass.   Assessment/Plan: Basically normal examination.  This slight erythematous area in the left tonsillar fossa may represent a healing aphthous ulcer.  That could explain the severity of his symptoms.  There is no treatment for this but should resolve over the next week or so.  Recommend supportive treatment.  Follow-up with me as an outpatient if he continues to have problems.  Izora Gala 05/17/2019, 12:44 PM

## 2019-05-17 NOTE — Evaluation (Signed)
Physical Therapy Evaluation Patient Details Name: [name] MRN: [id number] DOB: March 31, 1928 Today's Date: 05/17/2019   History of Present Illness  Pt is an 86 year old male with past medical history significant for coronary artery disease status post PCI and CABG, mild aortic stenosis, nephrolithiasis, hyperlipidemia, hypertension, diabetes mellitus, BPH, chronic atrial fibrillation on Eliquis.  Patient presented with a 1 week history of viral syndrome. CXR showed L lower lobe infiltrate with left-sided pleural effusion.     Clinical Impression  Pt is pleasant and sitting in recliner on entry on room air. Pt reports having a new hx of R foot pain without subsequent injury however he reports it has been feeling much better and is now able to bear weight through it. Pt came from home and at [facility] was independent with SPC use prn. Pt is supervision for bed mobility and sit<>stand transfers and min guard for ambulation with RW. Pt ambulates with R antalgic gait due to pain upon weight bearing upon R foot however decreasing in nature. Pt presents with decreased endurance with SpO2 briefly dropping to 89% during ambulation, decreased functional mobility, standing balance, and increased HR due to medical concerns. D/C plan [facility]. PT will continue to follow pt acutely.     Follow Up Recommendations Home health PT    Equipment Recommendations  None recommended by PT       Precautions / Restrictions Precautions Precautions: Fall Restrictions Weight Bearing Restrictions: No      Mobility  Bed Mobility               General bed mobility comments: Pt received in recliner, RN reports pt was supervision to get OOB wtih HOB elevated  Transfers Overall transfer level: Needs assistance Equipment used: Rolling walker (2 wheeled) Transfers: Sit to/from Stand Sit to Stand: Supervision         General transfer comment: supervision for cuing and increased  time  Ambulation/Gait Ambulation/Gait assistance: Min guard Gait Distance (Feet): 80 Feet Assistive device: Rolling walker (2 wheeled) Gait Pattern/deviations: Step-through pattern;Decreased step length - right;Decreased step length - left;Narrow base of support;Antalgic Gait velocity: decreased Gait velocity interpretation: 1.31 - 2.62 ft/sec, indicative of limited community ambulator General Gait Details: Pt requires 1 short standing rest break due to SOB complaint.                Balance Overall balance assessment: Needs assistance Sitting-balance support: Feet supported Sitting balance-Leahy Scale: Good Sitting balance - Comments: able to sit at edge of recliner without UE support without concern   Standing balance support: During functional activity;Bilateral upper extremity supported Standing balance-Leahy Scale: Fair Standing balance comment: Improved steadiness with B UE support however able to stand without UE support statically for short intervals                             Pertinent Vitals/Pain Pain Assessment: Faces Faces Pain Scale: Hurts a little bit Pain Location: R foot Pain Descriptors / Indicators: Sore Pain Intervention(s): Limited activity within patient's tolerance;Monitored during session    Home Living Family/patient expects to be discharged to:: Private residence Living Arrangements: Spouse/significant other Available Help at Discharge: Family Type of Home: House Home Access: Level entry     Home Layout: One level Home Equipment: Shower seat;Grab bars - toilet;Grab bars - tub/shower;Walker - 2 wheels;Cane - single point      Prior Function Level of Independence: Independent with assistive device(s)  Comments: Pt reports he ambulates in the home without AD, SPC prn. In the community pt uses RW or SPC from time to time and will use the electric cart in the grocery store.      Hand Dominance   Dominant Hand: Left     Extremity/Trunk Assessment   Upper Extremity Assessment Upper Extremity Assessment: Overall WFL for tasks assessed    Lower Extremity Assessment Lower Extremity Assessment: RLE deficits/detail;LLE deficits/detail;Generalized weakness RLE Deficits / Details: 4/5 MMT(pain with ankle PF) RLE Sensation: WNL RLE Coordination: WNL LLE Deficits / Details: 4/5 MMT LLE Sensation: WNL LLE Coordination: WNL       Communication   Communication: No difficulties  Cognition Arousal/Alertness: Awake/alert Behavior During Therapy: WFL for tasks assessed/performed Overall Cognitive Status: Within Functional Limits for tasks assessed                                        General Comments General comments (skin integrity, edema, etc.): Pt is on room air. Resting SpO2 is 94-100%. Resting HR is 85-91 bpm. Upon exertion, SpO2 briefly drops to 89% however quickly recovers. HR with functional mobility 126-144 bpm    Exercises Other Exercises Other Exercises: pursed lip breathing   PT Assessment Patient needs continued PT services  PT Problem List Decreased strength;Decreased activity tolerance;Decreased balance;Decreased mobility;Decreased knowledge of use of DME;Pain       PT Treatment Interventions DME instruction;Gait training;Functional mobility training;Therapeutic activities;Therapeutic exercise;Balance training;Neuromuscular re-education;Patient/family education    PT Goals (Current goals can be found in the Care Plan section)  Acute Rehab PT Goals Patient Stated Goal: to go home PT Goal Formulation: With patient Time For Goal Achievement: 05/31/19 Potential to Achieve Goals: Good    Frequency Min 3X/week    AM-PAC PT "6 Clicks" Mobility  Outcome Measure Help needed turning from your back to your side while in a flat bed without using bedrails?: None Help needed moving from lying on your back to sitting on the side of a flat bed without using bedrails?: None Help  needed moving to and from a bed to a chair (including a wheelchair)?: A Little Help needed standing up from a chair using your arms (e.g., wheelchair or bedside chair)?: None Help needed to walk in hospital room?: A Little Help needed climbing 3-5 steps with a railing? : A Lot 6 Click Score: 20    End of Session Equipment Utilized During Treatment: Gait belt Activity Tolerance: Patient tolerated treatment well Patient left: in chair;with call bell/phone within reach;with chair alarm set Nurse Communication: Mobility status PT Visit Diagnosis: Unsteadiness on feet (R26.81);Muscle weakness (generalized) (M62.81);Pain Pain - Right/Left: Right Pain - part of body: Ankle and joints of foot    Time: [id number] PT Time Calculation (min) (ACUTE ONLY): 31 min   Charges:   PT Evaluation $PT Eval Moderate Complexity: 1 Mod PT Treatments $Therapeutic Activity: 8-22 mins       Jodelle Green, PT, DPT Acute Rehabilitation Services Office [phone]  Jodelle Green 05/17/2019, 1:02 PM

## 2019-05-17 NOTE — Progress Notes (Addendum)
Progress Note  Patient Name: [name] Date of Encounter: 05/17/2019  Primary Cardiologist: Peter Martinique, MD   Subjective   HR still in the upper 90's and low 100's. Denies any CP or SOB  Inpatient Medications    Scheduled Meds: . apixaban  5 mg Oral BID  . diclofenac Sodium  2 g Topical QID  . diltiazem  360 mg Oral Daily  . insulin aspart  0-5 Units Subcutaneous QHS  . insulin aspart  0-9 Units Subcutaneous TID WC  . insulin glargine  20 Units Subcutaneous Daily  . metoprolol succinate  100 mg Oral Daily  . metoprolol succinate  50 mg Oral QHS  . sodium chloride flush  3 mL Intravenous Q12H  . sodium chloride flush  3 mL Intravenous Q12H   Continuous Infusions: . sodium chloride    . azithromycin Stopped (05/17/19 0700)  . cefTRIAXone (ROCEPHIN)  IV Stopped (05/17/19 0700)   PRN Meds: sodium chloride, acetaminophen **OR** acetaminophen, alum & mag hydroxide-simeth **AND** lidocaine, guaiFENesin-dextromethorphan, lidocaine (PF), ondansetron **OR** ondansetron (ZOFRAN) IV, oxyCODONE-acetaminophen **AND** oxyCODONE, phenol, sodium chloride flush   Vital Signs    Vitals:   05/16/19 0930 05/16/19 2120 05/17/19 0500 05/17/19 0832  BP: (!) 119/42 125/60  (!) 149/87  Pulse: 98 (!) 106  (!) 104  Resp: 20 20  20   Temp: 97.6 F (36.4 C) 98 F (36.7 C)  97.8 F (36.6 C)  TempSrc: Oral Oral    SpO2: 96% 93%  94%  Weight:   93.3 kg   Height:        Intake/Output Summary (Last 24 hours) at 05/17/2019 [id number] Last data filed at 05/16/2019 2211 Gross per 24 hour  Intake 275 ml  Output --  Net 275 ml   Filed Weights   05/15/19 0500 05/16/19 0500 05/17/19 0500  Weight: 92.9 kg 93.2 kg 93.3 kg    Telemetry    Atrial fibrillation with occasional PVCs - Personally Reviewed  ECG    No new EKG to review- Personally Reviewed  Physical Exam   GEN: Well nourished, well developed in no acute distress HEENT: Normal NECK: No JVD; No carotid bruits LYMPHATICS: No  lymphadenopathy CARDIAC:Irregularly irregular, no murmurs, rubs, gallops RESPIRATORY:  Clear to auscultation without rales, wheezing or rhonchi  ABDOMEN: Soft, non-tender, non-distended MUSCULOSKELETAL:  No edema; No deformity  SKIN: Warm and dry NEUROLOGIC:  Alert and oriented x 3 PSYCHIATRIC:  Normal affect    Labs    Chemistry Recent Labs  Lab 05/13/19 1950 05/13/19 1950 05/14/19 0255 05/15/19 0309 05/17/19 0225  NA 136   < > 138 138 138  K 3.5   < > 3.0* 3.7 3.8  CL 100   < > 102 102 104  CO2 22   < > 23 25 24   GLUCOSE 245*   < > 175* 148* 226*  BUN 16   < > 15 11 14   CREATININE 1.14   < > 1.05 0.99 0.84  CALCIUM 8.6*   < > 8.4* 8.7* 8.4*  PROT 5.7*  --   --   --   --   ALBUMIN 2.7*  --   --  2.2*  --   AST 19  --   --   --   --   ALT 15  --   --   --   --   ALKPHOS 75  --   --   --   --   BILITOT 1.2  --   --   --   --  GFRNONAA 57*   < > >60 >60 >60  GFRAA >60   < > >60 >60 >60  ANIONGAP 14   < > 13 11 10    < > = values in this interval not displayed.     Hematology Recent Labs  Lab 05/15/19 0309 05/16/19 0135 05/17/19 0225  WBC 18.7* 18.9* 15.5*  RBC 4.04* 3.90* 4.12*  HGB 10.4* 10.2* 10.8*  HCT 33.3* 32.3* 33.9*  MCV 82.4 82.8 82.3  MCH 25.7* 26.2 26.2  MCHC 31.2 31.6 31.9  RDW 16.0* 16.3* 16.1*  PLT 381 381 412*    Cardiac EnzymesNo results for input(s): TROPONINI in the last 168 hours. No results for input(s): TROPIPOC in the last 168 hours.   BNP Recent Labs  Lab 05/14/19 0936  BNP 272.9*     DDimer No results for input(s): DDIMER in the last 168 hours.   Radiology    DG Chest 2 View  Result Date: 05/15/2019 CLINICAL DATA:  Admitted for shortness of breath. Pneumonia. EXAM: CHEST - 2 VIEW COMPARISON:  05/14/2019 FINDINGS: Status post median sternotomy and CABG procedure. Cardiac enlargement. Aortic atherosclerosis. Moderate left pleural effusion. Left midlung and left base airspace opacities are unchanged. Right lung is clear.  IMPRESSION: 1. Persistent left pleural effusion with left midlung and left base opacities. 2.  Aortic Atherosclerosis (ICD10-I70.0). Electronically Signed   By: Kerby Moors M.D.   On: 05/15/2019 11:39   DG Foot Complete Right  Result Date: 05/16/2019 CLINICAL DATA:  Plantar foot pain for 3 days. EXAM: RIGHT FOOT COMPLETE - 3+ VIEW COMPARISON:  None. FINDINGS: There is no evidence of fracture or dislocation. There is no evidence of arthropathy or other focal bone abnormality. Soft tissues are unremarkable. IMPRESSION: Negative. Electronically Signed   By: Misty Stanley M.D.   On: 05/16/2019 09:53    Cardiac Studies   2D echo 05/14/2019 IMPRESSIONS   1. Left ventricular ejection fraction, by estimation, is 60 to 65%. The  left ventricle has normal function. The left ventricle has no regional  wall motion abnormalities. There is moderate concentric left ventricular  hypertrophy. Left ventricular  diastolic parameters are indeterminate.  2. Right ventricular systolic function is normal. The right ventricular  size is normal.  3. The mitral valve is normal in structure. No evidence of mitral valve  regurgitation. No evidence of mitral stenosis.  4. The aortic valve is normal in structure. Aortic valve regurgitation is  mild. Moderate aortic valve stenosis. Aortic valve area, by VTI measures  1.27 cm. Aortic valve mean gradient measures 19.3 mmHg. Aortic valve Vmax  measures 2.75 m/s.  5. Compared with th echo 09/2018, ascending aorta has increased from 4.3  cm to 4.8cm. . Aortic dilatation noted. There is moderate dilatation of  the ascending aorta measuring 48 mm.  6. The inferior vena cava is normal in size with greater than 50%  respiratory variability, suggesting right atrial pressure of 3 mmHg.   Patient Profile     86 y.o. male with a hx of persistent atrial fibrillation on Eliquis, CAD s/p CABG in 1990 and DES to RCA in 2006, CVA in April [id number], chronic diastolic HF (EF  [phone] [id number]), moderate AS, remote tobacco use, HTN, DM2, HLD, and right carotid stenosis 60% who is being seen  for the evaluation of Afib RVR at the request of Dr. Marthenia Rolling.  Assessment & Plan    1.  Sepsis/PNA/left large pleural effusion -Cxray with LLL PNA and pleural effusion -antibx per THR -s/p  thoracentesis of 1L amber pleural fluid today and feels much better -COVID negative  2.  Permanent atrial fibrillation -HR has been elevated likely related to fever, sepsis as well as reduction in home BB dose due to soft BP -HR remains in upper 90's to low 100's at rest -continue Cardizem 360mg  daily -Increase Toprol back to home dose of 100mg  BID for better HR control -continue apixaban 5mg  BID  3.  ASCAD -s/p remote CABG in 1990 and PCI of mRCA in 2006 -he denies any anginal sx - just pleuritic pain related to pleural effusion that has improved after thoracentesis -2D echo with normal LVF EF 60-65% -hsTrop normal at 18>16 -no CP or SOB today -continue  BB -restart statin -no ASA due to DOAC  4.  Chronic diastolic CHF -appears euvolemic on exam -will restart home dose of Lasix 40mg  daily  5.  HTN -BP elevated this am at 149/74mmHg -continue Cardizem -Increasing Toprol XL 100mg  BID for better BP control - this was his home dose  6.  Aortic Stenosis -moderate by echo with mean AVG  19.68mmHg, Vmax  2.36m/s and AVA 1.27cm2.   -continue to follow yearly  7.  Ascending aortic aneurysm -measures 49mm on echo which has increased from 4.3 in August -this will need to be followed up on outpt -continue BB  -restart atorva 40mg  daily  8.  Wide complex tachycardia -15 beats on tele -? NSVT vs. Aberration in setting of afib -no further episodes on tele in past 24 hours -continue BB- increasing back to home dose -LVF normal  [facility] will sign off.   Medication Recommendations:  Atorvastatin 40mg  daily, Toprol XL 100mg  BID, Cardizem CD 360mg  daily, Apixaban 5mg  BID and  Lasix 40mg  daily, Kdur 74meq daily Other recommendations (labs, testing, etc):  BMET in 1 week Follow up as an outpatient:  Dr. Martinique in 2-3 weeks   For questions or updates, please contact [place] Please consult [email or web address] for contact info under Cardiology/STEMI.      Signed, Fransico Him, MD  05/17/2019, 9:03 AM

## 2019-05-17 NOTE — Progress Notes (Signed)
Inpatient Diabetes Program Recommendations  AACE/ADA: New Consensus Statement on Inpatient Glycemic Control (2015)  Target Ranges:  Prepandial:   less than 140 mg/dL      Peak postprandial:   less than 180 mg/dL (1-2 hours)      Critically ill patients:  140 - 180 mg/dL   Lab Results  Component Value Date   GLUCAP 258 (H) 05/17/2019   HGBA1C 8.5 (A) 02/06/2019   Diabetes history: Type 2 DM Outpatient Diabetes medications: Lantus 38 units QAM, Metformin 500 mg BID, Jardiance 5 mg QD Current orders for Inpatient glycemic control: Lantus 20 units QD, Novolog 0-9 units TID, Novolog 0-5 units QHS  Inpatient Diabetes Program Recommendations:    If to remain inpatient consider increasing Lantus to 24 units QHS.   Thanks, Bronson Curb, MSN, RNC-OB Diabetes Coordinator [phone] (8a-5p)

## 2019-05-17 NOTE — Progress Notes (Signed)
PROGRESS NOTE  [name] [id number] DOB: 1928/12/16 DOA: 05/13/2019 PCP: [name], MD   LOS: 4 days   Brief Narrative / Interim history: 86 year old male with CAD status post PCI/CABG, aortic stenosis, nephrolithiasis, hypertension, hyperlipidemia, DM 2, chronic A. fib on Eliquis came in with a week history of a viral syndrome with sore throat which started at the beginning of March, runny nose, chest congestion and nonproductive cough.  He denies any fever or chills.  On admission he was having significant leukocytosis, chest x-ray showed left lower lobe infiltrate with left-sided pleural effusion.  His Covid was negative and he was vaccinated with both doses in February.  He was admitted to the hospital and treated presumed for pneumonia.  Underwent left-sided thoracentesis on 3/16, fluid was exudative  Subjective / 24h Interval events: Has persistent sore throat, difficulties eating. Right foot seems to be a little bit better but still quite tender, he was able to ambulate yesterday with less pain.  Assessment & Plan: Principal Problem Lobar pneumonia with associated large exudative left pleural effusion, acute hypoxic respiratory failure -Patient was admitted to the hospital with acute viral syndrome, significant leukocytosis, shortness of breath, chest x-ray did show left lower lobe infiltrate with left sided pleural effusion -He was placed on IV vancomycin initially which has now been discontinued -He was also placed on ceftriaxone and azithromycin, continue, plan for total of 5-7 days depending on clinical improvement/resolution of his WBC, white count improving today -Covid test was negative -Status post IR thoracentesis, follow on cultures, negative so far -Sore throat has been the main issue that is bothering him, difficulties with pain with swallowing, throat feels "raw". Given prolonged symptoms, I have consulted ENT, appreciate input  Active Problems A. fib with  RVR -Rate controlled, continue beta-blocker, Cardizem, cardiology has been consulted and are following -Continue anticoagulation with Eliquis -Cardiology following, appreciate input  Right foot pain -Relatively sudden onset, no trauma, ankle has full range of motion states pain is mainly in the midfoot.  He has good peripheral pulses, foot is warm, do not suspect any vascular issues -Still remains very tender with palpation today, x-ray negative, obtain MRI given unclear etiology  Coronary artery disease -No chest pain, troponin negative, cardiology following  Chronic diastolic CHF -Appears compensated  Insulin-dependent diabetes mellitus -A1c was 8.5 in December 2020, somewhat reasonable in his age group.  Continue Lantus and sliding scale  History of mild to moderate aortic stenosis mild dilatation of the ascending aorta -2D echo done on 3/15 showed EF of 60 to 65%, mild aortic valve regurgitation and moderate stenosis, valve area 1.27 cm2  Scheduled Meds: . apixaban  5 mg Oral BID  . atorvastatin  40 mg Oral q1800  . diclofenac Sodium  2 g Topical QID  . diltiazem  360 mg Oral Daily  . furosemide  40 mg Oral Daily  . insulin aspart  0-5 Units Subcutaneous QHS  . insulin aspart  0-9 Units Subcutaneous TID WC  . insulin glargine  20 Units Subcutaneous Daily  . metoprolol succinate  100 mg Oral BID  . neomycin-bacitracin-polymyxin   Topical Once  . sodium chloride flush  3 mL Intravenous Q12H  . sodium chloride flush  3 mL Intravenous Q12H   Continuous Infusions: . sodium chloride    . azithromycin Stopped (05/17/19 0700)  . cefTRIAXone (ROCEPHIN)  IV Stopped (05/17/19 0700)   PRN Meds:.sodium chloride, acetaminophen **OR** acetaminophen, alum & mag hydroxide-simeth **AND** lidocaine, guaiFENesin-dextromethorphan, lidocaine (PF), lidocaine, lidocaine, lidocaine-EPINEPHrine,  ondansetron **OR** ondansetron (ZOFRAN) IV, oxyCODONE-acetaminophen **AND** oxyCODONE, oxymetazoline,  phenol, silver nitrate applicators, sodium chloride flush  DVT prophylaxis: On Eliquis Code Status: Full code Family Communication: We will update wife in the afternoon Patient admitted from: Home Anticipated d/c place: Home Barriers to d/c: ENT evaluation pending, MRI of the right foot, physical therapy evaluation, white count still high needing intravenous antibiotics, anticipate home discharge within 24 hours  Consultants:  Cardiology ENT  Procedures:  2D echo:  1. Left ventricular ejection fraction, by estimation, is 60 to 65%. The  left ventricle has normal function. The left ventricle has no regional  wall motion abnormalities. There is moderate concentric left ventricular  hypertrophy. Left ventricular  diastolic parameters are indeterminate.  2. Right ventricular systolic function is normal. The right ventricular  size is normal.  3. The mitral valve is normal in structure. No evidence of mitral valve  regurgitation. No evidence of mitral stenosis.  4. The aortic valve is normal in structure. Aortic valve regurgitation is  mild. Moderate aortic valve stenosis. Aortic valve area, by VTI measures  1.27 cm. Aortic valve mean gradient measures 19.3 mmHg. Aortic valve Vmax  measures 2.75 m/s.  5. Compared with th echo 09/2018, ascending aorta has increased from 4.3  cm to 4.8cm. . Aortic dilatation noted. There is moderate dilatation of  the ascending aorta measuring 48 mm.  6. The inferior vena cava is normal in size with greater than 50%  respiratory variability, suggesting right atrial pressure of 3 mmHg.   Microbiology  No growth to date  Antimicrobials: Ceftriaxone/azithromycin 3/14 >> today is day 4   Objective: Vitals:   05/16/19 0930 05/16/19 2120 05/17/19 0500 05/17/19 0832  BP: (!) 119/42 125/60  (!) 149/87  Pulse: 98 (!) 106  (!) 104  Resp: 20 20  20   Temp: 97.6 F (36.4 C) 98 F (36.7 C)  97.8 F (36.6 C)  TempSrc: Oral Oral    SpO2: 96% 93%   94%  Weight:   93.3 kg   Height:        Intake/Output Summary (Last 24 hours) at 05/17/2019 1118 Last data filed at 05/16/2019 2211 Gross per 24 hour  Intake 275 ml  Output --  Net 275 ml   Filed Weights   05/15/19 0500 05/16/19 0500 05/17/19 0500  Weight: 92.9 kg 93.2 kg 93.3 kg    Examination:  Constitutional: No distress Eyes: No icterus ENMT: Moist mucous membranes Neck: normal, supple Respiratory: Diminished at the bases, clear, no wheezing or crackles Cardiovascular: Irregular, no edema Abdomen: Nontender, nondistended, bowel sounds positive Musculoskeletal: no clubbing / cyanosis. Right foot remains tender to palpation without erythema or obvious deformities Skin: No rashes seen Neurologic: Nonfocal  Data Reviewed: I have independently reviewed following labs and imaging studies   CBC: Recent Labs  Lab 05/13/19 1950 05/14/19 0255 05/15/19 0309 05/16/19 0135 05/17/19 0225  WBC 20.4* 18.5* 18.7* 18.9* 15.5*  NEUTROABS 17.0* 16.1* 14.6*  --   --   HGB 11.7* 10.5* 10.4* 10.2* 10.8*  HCT 37.5* 33.2* 33.3* 32.3* 33.9*  MCV 83.1 82.6 82.4 82.8 82.3  PLT 391 367 381 381 [id number]*   Basic Metabolic Panel: Recent Labs  Lab 05/13/19 1950 05/14/19 0255 05/14/19 1319 05/15/19 0309 05/17/19 0225  NA 136 138  --  138 138  K 3.5 3.0*  --  3.7 3.8  CL 100 102  --  102 104  CO2 22 23  --  25 24  GLUCOSE 245* 175*  --  148* 226*  BUN 16 15  --  11 14  CREATININE 1.14 1.05  --  0.99 0.84  CALCIUM 8.6* 8.4*  --  8.7* 8.4*  MG  --  1.8  --  2.0  --   PHOS  --   --  3.5 3.3  --    Liver Function Tests: Recent Labs  Lab 05/13/19 1950 05/15/19 0309  AST 19  --   ALT 15  --   ALKPHOS 75  --   BILITOT 1.2  --   PROT 5.7*  --   ALBUMIN 2.7* 2.2*   Coagulation Profile: Recent Labs  Lab 05/13/19 1950 05/14/19 0255 05/15/19 0309  INR 2.1* 1.9* 1.2   HbA1C: No results for input(s): HGBA1C in the last 72 hours. CBG: Recent Labs  Lab 05/16/19 0813  05/16/19 1152 05/16/19 1750 05/16/19 2103 05/17/19 0829  GLUCAP 132* 143* 275* 250* 207*    Recent Results (from the past 240 hour(s))  Blood Culture (routine x 2)     Status: None (Preliminary result)   Collection Time: 05/13/19  7:51 PM   Specimen: BLOOD LEFT FOREARM  Result Value Ref Range Status   Specimen Description BLOOD LEFT FOREARM  Final   Special Requests   Final    BOTTLES DRAWN AEROBIC AND ANAEROBIC Blood Culture adequate volume   Culture   Final    NO GROWTH 4 DAYS Performed at [facility], [place] [address] [place], [place] [address]    Report Status PENDING  Incomplete  Urine culture     Status: Abnormal   Collection Time: 05/13/19  8:48 PM   Specimen: Urine, Clean Catch  Result Value Ref Range Status   Specimen Description URINE, CLEAN CATCH  Final   Special Requests NONE  Final   Culture (A)  Final    <10,000 COLONIES/mL INSIGNIFICANT GROWTH Performed at [facility], [place] [address], [place], [place] [address]    Report Status 05/15/2019 FINAL  Final  Blood Culture (routine x 2)     Status: None (Preliminary result)   Collection Time: 05/13/19  9:25 PM   Specimen: BLOOD  Result Value Ref Range Status   Specimen Description BLOOD RIGHT ARM  Final   Special Requests   Final    BOTTLES DRAWN AEROBIC AND ANAEROBIC Blood Culture adequate volume   Culture   Final    NO GROWTH 4 DAYS Performed at [facility], [place] [address] [place], [place] [address]    Report Status PENDING  Incomplete  Respiratory Panel by RT PCR (Flu A&B, Covid) - Nasopharyngeal Swab     Status: None   Collection Time: 05/13/19  9:39 PM   Specimen: Nasopharyngeal Swab  Result Value Ref Range Status   SARS Coronavirus 2 by RT PCR NEGATIVE NEGATIVE Final    Comment: (NOTE) SARS-CoV-2 target nucleic acids are NOT DETECTED. The SARS-CoV-2 RNA is generally detectable in upper respiratoy specimens during the acute phase of infection. The lowest concentration of  SARS-CoV-2 viral copies this assay can detect is 131 copies/mL. A negative result does not preclude SARS-Cov-2 infection and should not be used as the sole basis for treatment or other patient management decisions. A negative result may occur with  improper specimen collection/handling, submission of specimen other than nasopharyngeal swab, presence of viral mutation(s) within the areas targeted by this assay, and inadequate number of viral copies (<131 copies/mL). A negative result must be combined with clinical observations, patient history, and  epidemiological information. The expected result is Negative. Fact Sheet for Patients:  PinkCheek.be Fact Sheet for Healthcare Providers:  GravelBags.it This test is not yet ap proved or cleared by the [place] FDA and  has been authorized for detection and/or diagnosis of SARS-CoV-2 by FDA under an Emergency Use Authorization (EUA). This EUA will remain  in effect (meaning this test can be used) for the duration of the COVID-19 declaration under Section 564(b)(1) of the Act, 21 U.S.C. section 360bbb-3(b)(1), unless the authorization is terminated or revoked sooner.    Influenza A by PCR NEGATIVE NEGATIVE Final   Influenza B by PCR NEGATIVE NEGATIVE Final    Comment: (NOTE) The Xpert Xpress SARS-CoV-2/FLU/RSV assay is intended as an aid in  the diagnosis of influenza from Nasopharyngeal swab specimens and  should not be used as a sole basis for treatment. Nasal washings and  aspirates are unacceptable for Xpert Xpress SARS-CoV-2/FLU/RSV  testing. Fact Sheet for Patients: PinkCheek.be Fact Sheet for Healthcare Providers: GravelBags.it This test is not yet approved or cleared by the [place] FDA and  has been authorized for detection and/or diagnosis of SARS-CoV-2 by  FDA under an Emergency Use Authorization (EUA).  This EUA will remain  in effect (meaning this test can be used) for the duration of the  Covid-19 declaration under Section 564(b)(1) of the Act, 21  U.S.C. section 360bbb-3(b)(1), unless the authorization is  terminated or revoked. Performed at [facility], [place] [address], [place], [place] [address]   MRSA PCR Screening     Status: None   Collection Time: 05/14/19  8:47 AM   Specimen: Nasal Mucosa; Nasopharyngeal  Result Value Ref Range Status   MRSA by PCR NEGATIVE NEGATIVE Final    Comment:        The GeneXpert MRSA Assay (FDA approved for NASAL specimens only), is one component of a comprehensive MRSA colonization surveillance program. It is not intended to diagnose MRSA infection nor to guide or monitor treatment for MRSA infections. Performed at [facility], [place] [address] [place], [place] [address]   Respiratory Panel by PCR     Status: None   Collection Time: 05/14/19 10:10 AM  Result Value Ref Range Status   Adenovirus NOT DETECTED NOT DETECTED Final   Coronavirus 229E NOT DETECTED NOT DETECTED Final    Comment: (NOTE) The Coronavirus on the Respiratory Panel, DOES NOT test for the novel  Coronavirus (2019 nCoV)    Coronavirus HKU1 NOT DETECTED NOT DETECTED Final   Coronavirus NL63 NOT DETECTED NOT DETECTED Final   Coronavirus OC43 NOT DETECTED NOT DETECTED Final   Metapneumovirus NOT DETECTED NOT DETECTED Final   Rhinovirus / Enterovirus NOT DETECTED NOT DETECTED Final   Influenza A NOT DETECTED NOT DETECTED Final   Influenza B NOT DETECTED NOT DETECTED Final   Parainfluenza Virus 1 NOT DETECTED NOT DETECTED Final   Parainfluenza Virus 2 NOT DETECTED NOT DETECTED Final   Parainfluenza Virus 3 NOT DETECTED NOT DETECTED Final   Parainfluenza Virus 4 NOT DETECTED NOT DETECTED Final   Respiratory Syncytial Virus NOT DETECTED NOT DETECTED Final   Bordetella pertussis NOT DETECTED NOT DETECTED Final   Chlamydophila pneumoniae NOT DETECTED NOT DETECTED  Final   Mycoplasma pneumoniae NOT DETECTED NOT DETECTED Final    Comment: Performed at [facility], [place] [address] [place], [place] [address]  Acid Fast Smear (AFB)     Status: None   Collection Time: 05/14/19  1:28 PM  Specimen: PATH Cytology Pleural fluid  Result Value Ref Range Status   AFB Specimen Processing Concentration  Final   Acid Fast Smear Negative  Final    Comment: (NOTE) Performed At: [facility] [place], [place] [id number] Rush Farmer MD [id number]    Source (AFB) PLEURAL  Final    Comment: FLUID Performed at [facility], [place] [address] [place], [place] [address]   Culture, body fluid-bottle     Status: None (Preliminary result)   Collection Time: 05/14/19  1:28 PM   Specimen: Pleura  Result Value Ref Range Status   Specimen Description PLEURAL FLUID  Final   Special Requests BOTTLES DRAWN AEROBIC AND ANAEROBIC 10CC  Final   Culture   Final    NO GROWTH 3 DAYS Performed at [facility], [place] [address], [place], [place] [address]    Report Status PENDING  Incomplete  Gram stain     Status: None   Collection Time: 05/14/19  1:28 PM   Specimen: Pleura  Result Value Ref Range Status   Specimen Description PLEURAL FLUID  Final   Special Requests NONE  Final   Gram Stain   Final    MODERATE WBC PRESENT, PREDOMINANTLY MONONUCLEAR NO ORGANISMS SEEN Performed at [facility], [place] [address], [place], [place] [address]    Report Status 05/14/2019 FINAL  Final  Culture, group A strep     Status: None (Preliminary result)   Collection Time: 05/16/19  4:20 PM   Specimen: Throat  Result Value Ref Range Status   Specimen Description THROAT  Final   Special Requests NONE  Final   Culture   Final    CULTURE REINCUBATED FOR BETTER GROWTH Performed at [facility], [place] [address] [place], [place] [address]    Report Status PENDING  Incomplete     Radiology Studies: No results found. Marzetta Board,  MD, PhD [facility]  Between 7 am - 7 pm I am available, please contact me via Amion or Securechat  Between 7 pm - 7 am I am not available, please contact night coverage MD/APP via Amion

## 2019-05-18 LAB — CBC
HCT: 36.1 % — ABNORMAL LOW (ref 39.0–52.0)
Hemoglobin: 11.4 g/dL — ABNORMAL LOW (ref 13.0–17.0)
MCH: 25.8 pg — ABNORMAL LOW (ref 26.0–34.0)
MCHC: 31.6 g/dL (ref 30.0–36.0)
MCV: 81.7 fL (ref 80.0–100.0)
Platelets: 473 10*3/uL — ABNORMAL HIGH (ref 150–400)
RBC: 4.42 MIL/uL (ref 4.22–5.81)
RDW: 16.3 % — ABNORMAL HIGH (ref 11.5–15.5)
WBC: 15.9 10*3/uL — ABNORMAL HIGH (ref 4.0–10.5)
nRBC: 0 % (ref 0.0–0.2)

## 2019-05-18 LAB — CULTURE, BLOOD (ROUTINE X 2)
Culture: NO GROWTH
Culture: NO GROWTH
Special Requests: ADEQUATE
Special Requests: ADEQUATE

## 2019-05-18 LAB — GLUCOSE, CAPILLARY: Glucose-Capillary: 221 mg/dL — ABNORMAL HIGH (ref 70–99)

## 2019-05-18 LAB — CULTURE, GROUP A STREP (THRC)

## 2019-05-18 MED ORDER — CEFDINIR 300 MG PO CAPS: 300.0000 mg | ORAL_CAPSULE | Freq: Two times a day (BID) | ORAL | 0 refills | Status: DC

## 2019-05-18 NOTE — Discharge Instructions (Signed)
Follow with Jeffrey Arnt, MD in 5-7 days  Please get a complete blood count and chemistry panel checked by your Primary MD at your next visit, and again as instructed by your Primary MD. Please get your medications reviewed and adjusted by your Primary MD.  Please request your Primary MD to go over all Hospital Tests and Procedure/Radiological results at the follow up, please get all Hospital records sent to your Prim MD by signing hospital release before you go home.  In some cases, there will be blood work, cultures and biopsy results pending at the time of your discharge. Please request that your primary care M.D. goes through all the records of your hospital data and follows up on these results.  If you had Pneumonia of Lung problems at the Hospital: Please get a 2 view Chest X ray done in 6-8 weeks after hospital discharge or sooner if instructed by your Primary MD.  If you have Congestive Heart Failure: Please call your Cardiologist or Primary MD anytime you have any of the following symptoms:  1) 3 pound weight gain in 24 hours or 5 pounds in 1 week  2) shortness of breath, with or without a dry hacking cough  3) swelling in the hands, feet or stomach  4) if you have to sleep on extra pillows at night in order to breathe  Follow cardiac low salt diet and 1.5 lit/day fluid restriction.  If you have diabetes Accuchecks 4 times/day, Once in AM empty stomach and then before each meal. Log in all results and show them to your primary doctor at your next visit. If any glucose reading is under 80 or above 300 call your primary MD immediately.  If you have Seizure/Convulsions/Epilepsy: Please do not drive, operate heavy machinery, participate in activities at heights or participate in high speed sports until you have seen by Primary MD or a Neurologist and advised to do so again. Per [facility] statutes, patients with seizures are not allowed to drive until they have been  seizure-free for six months.  Use caution when using heavy equipment or power tools. Avoid working on ladders or at heights. Take showers instead of baths. Ensure the water temperature is not too high on the home water heater. Do not go swimming alone. Do not lock yourself in a room alone (i.e. bathroom). When caring for infants or small children, sit down when holding, feeding, or changing them to minimize risk of injury to the child in the event you have a seizure. Maintain good sleep hygiene. Avoid alcohol.   If you had Gastrointestinal Bleeding: Please ask your Primary MD to check a complete blood count within one week of discharge or at your next visit. Your endoscopic/colonoscopic biopsies that are pending at the time of discharge, will also need to followed by your Primary MD.  Get Medicines reviewed and adjusted. Please take all your medications with you for your next visit with your Primary MD  Please request your Primary MD to go over all hospital tests and procedure/radiological results at the follow up, please ask your Primary MD to get all Hospital records sent to his/her office.  If you experience worsening of your admission symptoms, develop shortness of breath, life threatening emergency, suicidal or homicidal thoughts you must seek medical attention immediately by calling 911 or calling your MD immediately  if symptoms less severe.  You must read complete instructions/literature along with all the possible adverse reactions/side effects for all the Medicines you  take and that have been prescribed to you. Take any new Medicines after you have completely understood and accpet all the possible adverse reactions/side effects.   Do not drive or operate heavy machinery when taking Pain medications.   Do not take more than prescribed Pain, Sleep and Anxiety Medications  Special Instructions: If you have smoked or chewed Tobacco  in the last 2 yrs please stop smoking, stop any regular  Alcohol  and or any Recreational drug use.  Wear Seat belts while driving.  Please note You were cared for by a hospitalist during your hospital stay. If you have any questions about your discharge medications or the care you received while you were in the hospital after you are discharged, you can call the unit and asked to speak with the hospitalist on call if the hospitalist that took care of you is not available. Once you are discharged, your primary care physician will handle any further medical issues. Please note that NO REFILLS for any discharge medications will be authorized once you are discharged, as it is imperative that you return to your primary care physician (or establish a relationship with a primary care physician if you do not have one) for your aftercare needs so that they can reassess your need for medications and monitor your lab values.  You can reach the hospitalist office at phone [phone] or fax [phone]   If you do not have a primary care physician, you can call [phone] for a physician referral.  Activity: As tolerated with Full fall precautions use walker/cane & assistance as needed    Diet: heart healthy  Disposition Home

## 2019-05-18 NOTE — Discharge Summary (Signed)
Physician Discharge Summary  [name] [id number] DOB: [birth year] DOA: 05/13/2019  PCP: [name], MD  Admit date: 05/13/2019 Discharge date: 05/18/2019  Admitted From: home Disposition:  home  Recommendations for Outpatient Follow-up:  1. Follow up with PCP in 1-2 weeks 2. Please obtain BMP/CBC in one week 3. Please follow on final blood cultures, no growth at 4 days at the time of discharge 4. Hard sole shoe to assist with foot pain and calcaneometatarsal plantar ligament.strain  Home Health: PT Equipment/Devices: none  Discharge Condition: stable CODE STATUS: Full code Diet recommendation: heart healthy  HPI: Per admitting MD, [name] is a 86 y.o. male with medical history significant for chronic atrial fibrillation on Eliquis, coronary artery disease, chronic diastolic CHF, insulin-dependent diabetes mellitus, and hypertension, now presenting to the emergency department for evaluation of sore throat, rhinorrhea, cough, and chest congestion.  Patient reports that the symptoms began 1 week ago, states that his PCP recommended crushing baby aspirin, dissolving and water, and gargling.  He experienced significant improvement with this initially, but began to worsen again over the past 2 days.  Over the past 2 days, he has had worsening cough, mainly nonproductive, with left sided pleuritic pain, and worsening fatigue.  His sore throat is also worsened.  He denies any leg swelling or tenderness, denies headache, and reports that he received his second Covid vaccination in mid February.  He slept much of the day today, had loss of appetite, and eventually asked his wife to call EMS.  He was found to be febrile and saturating in the upper 80s on room air, was given acetaminophen and supplemental oxygen, and brought into the ED.  Hospital Course / Discharge diagnoses: Principal Problem Lobar pneumonia with associated large exudative left pleural effusion, acute hypoxic  respiratory failure -Patient was admitted to the hospital with acute viral syndrome, significant leukocytosis, shortness of breath, chest x-ray did show left lower lobe infiltrate with left sided pleural effusion.  He was initially placed on broad-spectrum antibiotics, and IR was consulted and patient underwent thoracentesis.  His fluid likely representing a parapneumonic effusion, cultures were without growth.  His respiratory status is improved, he was able to wean off to room air, stable, able to ambulate without significant respiratory symptoms, afebrile, his leukocytosis is improving and will be discharged home in stable condition to complete treatment with 4 additional days of Omnicef Active Problems A. fib with RVR -Rate controlled, continue beta-blocker, Cardizem, cardiology has been consulted and followed patient while hospitalized, he is rate controlled, will continue to follow as an outpatient Sore throat-persistent, over the last 2 and half weeks, extremely bothersome to the patient and had persistent symptoms despite conservative management.  Throat felt raw and had difficulties with swallowing.  ENT was consulted and he saw a slight erythematous area in the left tonsillar fossa which were present perhaps a healing aphthous ulcer without any specific treatment required right now. Right foot pain due to calcaneometatarsal plantar ligament.strain -Relatively sudden onset, no trauma, ankle has full range of motion states pain is mainly in the midfoot.  X-ray was negative and patient underwent an MRI which showed calcaneometatarsal plantar ligament.strain.  Discussed with orthopedic surgery on-call over the phone, recommended conservative management and a hard soled shoe to assist with ambulation as needed Coronary artery disease -No chest pain, troponin negative Chronic diastolic CHF -Appears compensated Insulin-dependent diabetes mellitus  -A1c was 8.5 in December 2020, somewhat reasonable in his  age group.  Continue home regimen  History of mild to moderate aortic stenosis mild dilatation of the ascending aorta -2D echo done on 3/15 showed EF of 60 to 65%, mild aortic valve regurgitation and moderate stenosis, valve area 1.27 cm2  Discharge Instructions   Allergies as of 05/18/2019      Reactions   Erythromycin Base    Cefadroxil Other (See Comments)   Unknown Unknown   Ciprofloxacin Other (See Comments)   Unknown Unknown   Erythromycin Other (See Comments)   Upset stomach Upset stomach      Medication List    STOP taking these medications   benzonatate 100 MG capsule Commonly known as: TESSALON   fluticasone 50 MCG/ACT nasal spray Commonly known as: FLONASE   oxyCODONE-acetaminophen 10-325 MG tablet Commonly known as: PERCOCET     TAKE these medications   apixaban 5 MG Tabs tablet Commonly known as: ELIQUIS Take 1 tablet (5 mg total) by mouth 2 (two) times daily.   atorvastatin 40 MG tablet Commonly known as: LIPITOR Take 1 tablet (40 mg total) by mouth daily at 6 PM.   Avodart 0.5 MG capsule Generic drug: dutasteride Take 0.5 mg by mouth every Monday, Wednesday, and Friday.   azelastine 0.1 % nasal spray Commonly known as: ASTELIN Place 1 spray into both nostrils 2 (two) times daily. Use in each nostril as directed   cefdinir 300 MG capsule Commonly known as: OMNICEF Take 1 capsule (300 mg total) by mouth 2 (two) times daily.   diltiazem 360 MG 24 hr capsule Commonly known as: Cardizem CD Take 1 capsule (360 mg total) by mouth daily.   Flomax 0.4 MG Caps capsule Generic drug: tamsulosin Take 0.4 mg by mouth every Tuesday, Thursday, Saturday, and Sunday.   furosemide 40 MG tablet Commonly known as: LASIX Take 40 mg by mouth daily.   gabapentin 300 MG capsule Commonly known as: NEURONTIN Take 1 capsule (300 mg total) by mouth 3 (three) times daily.   Jardiance 10 MG Tabs tablet Generic drug: empagliflozin Take 5 mg by mouth daily.    Lantus 100 UNIT/ML injection Generic drug: insulin glargine Inject 38 Units into the skin every morning.   metFORMIN 500 MG tablet Commonly known as: GLUCOPHAGE Take 500 mg by mouth 2 (two) times daily with a meal.   metoprolol succinate 100 MG 24 hr tablet Commonly known as: TOPROL-XL TAKE 1 TABLET BY MOUTH TWICE DAILY   nitroGLYCERIN 0.4 MG SL tablet Commonly known as: NITROSTAT Place 0.4 mg under the tongue every 5 (five) minutes as needed for chest pain (x 3 doses).   pantoprazole 40 MG tablet Commonly known as: PROTONIX TAKE 1 TABLET BY MOUTH DAILY AT NOON What changed: See the new instructions.   potassium chloride SA 20 MEQ tablet Commonly known as: KLOR-CON Take 20 mEq by mouth daily.   PRECISION XTRA TEST STRIPS test strip Generic drug: glucose blood USE TO TEST BLOOD SUGAR 1 TO 2 TIMES DAILY      Follow-up Information    Roby Lofts M., [name] Follow up.   Specialty: Physician Assistant Why: Cardiology hospital follow up on 05/30/19 at 11:15. Please arrive 15 minutes early for checkin.  Contact information: [address] [place] [address] [phone]        [facility] Follow up.   Specialty: Cardiology Why: Please go to our office above on 05/24/19 for labs between 8:00 and 4:30.  Contact information: [place] [phone]  Izora Gala, MD. Call.   Specialty: Otolaryngology Why: call as needed Contact information: [address] [place] [address] [phone]           Consultations:  ENT  Cardiology   Procedures/Studies:  DG Chest 1 View  Result Date: 05/14/2019 CLINICAL DATA:  Post left thoracentesis EXAM: CHEST  1 VIEW COMPARISON:  05/13/2019 FINDINGS: Decreased left pleural effusion post thoracentesis. No pneumothorax. Improved left lung aeration with residual left basilar atelectasis. Right lung remains clear. Stable  cardiomediastinal contours. IMPRESSION: Decreased left pleural effusion post thoracentesis. No pneumothorax. Residual left basilar atelectasis. Electronically Signed   By: Macy Mis M.D.   On: 05/14/2019 13:37   DG Chest 2 View  Result Date: 05/15/2019 CLINICAL DATA:  Admitted for shortness of breath. Pneumonia. EXAM: CHEST - 2 VIEW COMPARISON:  05/14/2019 FINDINGS: Status post median sternotomy and CABG procedure. Cardiac enlargement. Aortic atherosclerosis. Moderate left pleural effusion. Left midlung and left base airspace opacities are unchanged. Right lung is clear. IMPRESSION: 1. Persistent left pleural effusion with left midlung and left base opacities. 2.  Aortic Atherosclerosis (ICD10-I70.0). Electronically Signed   By: Kerby Moors M.D.   On: 05/15/2019 11:39   MR FOOT RIGHT W WO CONTRAST  Result Date: 05/18/2019 CLINICAL DATA:  Foot pain. No known trauma. EXAM: MRI OF THE RIGHT FOOT WITHOUT AND WITH CONTRAST TECHNIQUE: Multiplanar, multisequence MR imaging of the right hindfoot and midfoot was performed before and after the administration of intravenous contrast. CONTRAST:  10 cc Gadavist COMPARISON:  Radiographs dated 05/16/2019 FINDINGS: Bones/Joint/Cartilage The bones of the hindfoot and midfoot in the visualized portions of the metatarsals appear normal. Only the very distal aspect of the heads of the first and second metatarsals are not included on the field of view. Specifically, no evidence of stress fracture or stress reaction. Ligaments There is abnormal edema around the calcaneometatarsal plantar ligament best seen on images 17 through 23 of series 13 and on image 20 of series 10. There is enhancement of the soft tissues around this ligament after contrast administration. Muscles and Tendons There is mild tenosynovitis of the peroneus longus and brevis tendons with enhancement of those tendons sheaths after contrast administration. The anterior, medial, and posterior tendons  appear normal including the Achilles tendon. The plantar fascia appears normal. Soft tissues Nonspecific slight edema on the dorsum of the foot. IMPRESSION: 1. Findings consistent a strain of the calcaneometatarsal plantar ligament. 2. Mild tenosynovitis of the peroneus longus and brevis tendons. 3. No evidence of stress fracture or stress reaction. Electronically Signed   By: Lorriane Shire M.D.   On: 05/18/2019 08:10   DG Chest Port 1 View  Result Date: 05/13/2019 CLINICAL DATA:  Shortness of breath and cough. EXAM: PORTABLE CHEST 1 VIEW COMPARISON:  September 08, 2017 FINDINGS: Multiple sternal wires and vascular clips are seen. Moderate severity atelectasis and/or infiltrate is seen within the left lung base. There is a large left pleural effusion. No pneumothorax is identified. There is stable mild to moderate severity enlargement of the cardiac silhouette. There is marked severity calcification of the aortic arch. Multilevel degenerative changes seen throughout the thoracic spine. IMPRESSION: 1. Moderate severity left basilar atelectasis and/or infiltrate. 2. Large left pleural effusion. 3. Stable cardiomegaly with evidence of prior CABG. Electronically Signed   By: Virgina [place] M.D.   On: 05/13/2019 20:23   DG Foot Complete Right  Result Date: 05/16/2019 CLINICAL DATA:  Plantar foot pain for 3 days. EXAM: RIGHT FOOT  COMPLETE - 3+ VIEW COMPARISON:  None. FINDINGS: There is no evidence of fracture or dislocation. There is no evidence of arthropathy or other focal bone abnormality. Soft tissues are unremarkable. IMPRESSION: Negative. Electronically Signed   By: Misty Stanley M.D.   On: 05/16/2019 09:53   ECHOCARDIOGRAM COMPLETE  Result Date: 05/14/2019    ECHOCARDIOGRAM REPORT   Patient Name:   [name] Date of Exam: 05/14/2019 Medical Rec #:  [id number]       Height:       66.0 in Accession #:    [id number]      Weight:       197.3 lb Date of Birth:  Jul 04, 1928       BSA:          1.988 m  Patient Age:    85 years        BP:           125/66 mmHg Patient Gender: M               HR:           96 bpm. Exam Location:  Inpatient Procedure: 2D Echo, Cardiac Doppler and Color Doppler Indications:    I35.0 Nonrheumatic aortic (valve) stenosis  History:        Patient has prior history of Echocardiogram examinations, most                 recent 10/10/2018. CHF, CAD, Abnormal ECG and Prior CABG, Stroke;                 Risk Factors:Hypertension and Diabetes. Edema. Post                 thoracentsis.  Sonographer:    Roseanna Rainbow RDCS Referring Phys: [place]  Sonographer Comments: Technically difficult study due to poor echo windows. IMPRESSIONS  1. Left ventricular ejection fraction, by estimation, is 60 to 65%. The left ventricle has normal function. The left ventricle has no regional wall motion abnormalities. There is moderate concentric left ventricular hypertrophy. Left ventricular diastolic parameters are indeterminate.  2. Right ventricular systolic function is normal. The right ventricular size is normal.  3. The mitral valve is normal in structure. No evidence of mitral valve regurgitation. No evidence of mitral stenosis.  4. The aortic valve is normal in structure. Aortic valve regurgitation is mild. Moderate aortic valve stenosis. Aortic valve area, by VTI measures 1.27 cm. Aortic valve mean gradient measures 19.3 mmHg. Aortic valve Vmax measures 2.75 m/s.  5. Compared with th echo 09/2018, ascending aorta has increased from 4.3 cm to 4.8cm. . Aortic dilatation noted. There is moderate dilatation of the ascending aorta measuring 48 mm.  6. The inferior vena cava is normal in size with greater than 50% respiratory variability, suggesting right atrial pressure of 3 mmHg. FINDINGS  Left Ventricle: Left ventricular ejection fraction, by estimation, is 60 to 65%. The left ventricle has normal function. The left ventricle has no regional wall motion abnormalities. The left ventricular internal  cavity size was normal in size. There is  moderate concentric left ventricular hypertrophy. Left ventricular diastolic parameters are indeterminate. Right Ventricle: The right ventricular size is normal. No increase in right ventricular wall thickness. Right ventricular systolic function is normal. Left Atrium: Left atrial size was normal in size. Right Atrium: Right atrial size was normal in size. Pericardium: There is no evidence of pericardial effusion. Mitral Valve: The mitral valve is normal in structure. Normal  mobility of the mitral valve leaflets. No evidence of mitral valve regurgitation. No evidence of mitral valve stenosis. Tricuspid Valve: The tricuspid valve is normal in structure. Tricuspid valve regurgitation is mild . No evidence of tricuspid stenosis. Aortic Valve: The aortic valve is normal in structure.. There is moderate thickening and moderate calcification of the aortic valve. Aortic valve regurgitation is mild. Moderate aortic stenosis is present. There is moderate thickening of the aortic valve. There is moderate calcification of the aortic valve. Aortic valve mean gradient measures 19.3 mmHg. Aortic valve peak gradient measures 30.3 mmHg. Aortic valve area, by VTI measures 1.27 cm. Pulmonic Valve: The pulmonic valve was normal in structure. Pulmonic valve regurgitation is not visualized. No evidence of pulmonic stenosis. Aorta: Compared with th echo 09/2018, ascending aorta has increased from 4.3 cm to 4.8cm. The aortic root is normal in size and structure and aortic dilatation noted. There is moderate dilatation of the ascending aorta measuring 48 mm. Venous: The inferior vena cava was not well visualized. The inferior vena cava is normal in size with greater than 50% respiratory variability, suggesting right atrial pressure of 3 mmHg. IAS/Shunts: No atrial level shunt detected by color flow Doppler.  LEFT VENTRICLE PLAX 2D LVIDd:         4.90 cm LVIDs:         2.60 cm LV PW:         1.30  cm LV IVS:        1.50 cm LVOT diam:     2.20 cm LV SV:         65 LV SV Index:   33 LVOT Area:     3.80 cm  LV Volumes (MOD) LV vol d, MOD A2C: 56.1 ml LV vol d, MOD A4C: 43.4 ml LV vol s, MOD A2C: 23.6 ml LV vol s, MOD A4C: 20.7 ml LV SV MOD A2C:     32.5 ml LV SV MOD A4C:     43.4 ml LV SV MOD BP:      26.7 ml RIGHT VENTRICLE         IVC TAPSE (M-mode): 1.3 cm  IVC diam: 2.50 cm LEFT ATRIUM         Index LA diam:    4.55 cm 2.29 cm/m  AORTIC VALVE AV Area (Vmax):    1.45 cm AV Area (Vmean):   1.14 cm AV Area (VTI):     1.27 cm AV Vmax:           275.33 cm/s AV Vmean:          204.667 cm/s AV VTI:            0.513 m AV Peak Grad:      30.3 mmHg AV Mean Grad:      19.3 mmHg LVOT Vmax:         105.00 cm/s LVOT Vmean:        61.500 cm/s LVOT VTI:          0.172 m LVOT/AV VTI ratio: 0.34  AORTA Ao Root diam: 3.90 cm Ao Asc diam:  4.80 cm MITRAL VALVE                TRICUSPID VALVE MV Area (PHT): 3.29 cm     TR Peak grad:   24.2 mmHg MV Decel Time: 231 msec     TR Vmax:        246.00 cm/s MV E velocity: 113.50 cm/s  SHUNTS                             Systemic VTI:  0.17 m                             Systemic Diam: 2.20 cm Skeet Latch MD Electronically signed by Skeet Latch MD Signature Date/Time: 05/14/2019/6:52:07 PM    Final    IR THORACENTESIS ASP PLEURAL SPACE W/IMG GUIDE  Result Date: 05/14/2019 INDICATION: Patient with history of pneumonia, now with left pleural effusion. Request is made for diagnostic and therapeutic left thoracentesis. EXAM: ULTRASOUND GUIDED DIAGNOSTIC AND THERAPEUTIC LEFT THORACENTESIS MEDICATIONS: 10 mL 1% lidocaine COMPLICATIONS: None immediate. PROCEDURE: An ultrasound guided thoracentesis was thoroughly discussed with the patient and questions answered. The benefits, risks, alternatives and complications were also discussed. The patient understands and wishes to proceed with the procedure. Written consent was obtained. Ultrasound was performed  to localize and mark an adequate pocket of fluid in the left chest. The area was then prepped and draped in the normal sterile fashion. 1% Lidocaine was used for local anesthesia. Under ultrasound guidance a 6 Fr Safe-T-Centesis catheter was introduced. Thoracentesis was performed. The catheter was removed and a dressing applied. FINDINGS: A total of approximately 900 mL of amber fluid was removed. Samples were sent to the laboratory as requested by the clinical team. IMPRESSION: Successful ultrasound guided diagnostic and therapeutic left thoracentesis yielding 900 mL of pleural fluid. Read by: Brynda Greathouse [name] Electronically Signed   By: Lucrezia Europe M.D.   On: 05/14/2019 13:50      Subjective: - no chest pain, shortness of breath, no abdominal pain, nausea or vomiting.   Discharge Exam: BP 126/71 (BP Location: Left Arm)   Pulse 85   Temp (!) 97.5 F (36.4 C)   Resp 20   Ht 5\' 6"  (1.676 m)   Wt 93.3 kg   SpO2 96%   BMI 33.20 kg/m   General: Pt is alert, awake, not in acute distress Cardiovascular: irregular Respiratory: CTA bilaterally, no wheezing, no rhonchi Abdominal: Soft, NT, ND, bowel sounds + Extremities: no edema, no cyanosis  The results of significant diagnostics from this hospitalization (including imaging, microbiology, ancillary and laboratory) are listed below for reference.     Microbiology: Recent Results (from the past 240 hour(s))  Blood Culture (routine x 2)     Status: None (Preliminary result)   Collection Time: 05/13/19  7:51 PM   Specimen: BLOOD LEFT FOREARM  Result Value Ref Range Status   Specimen Description BLOOD LEFT FOREARM  Final   Special Requests   Final    BOTTLES DRAWN AEROBIC AND ANAEROBIC Blood Culture adequate volume   Culture   Final    NO GROWTH 4 DAYS Performed at [facility], [address], [place], [place] [address]    Report Status PENDING  Incomplete  Urine culture     Status: Abnormal   Collection Time: 05/13/19  8:48  PM   Specimen: Urine, Clean Catch  Result Value Ref Range Status   Specimen Description URINE, CLEAN CATCH  Final   Special Requests NONE  Final   Culture (A)  Final    <10,000 COLONIES/mL INSIGNIFICANT GROWTH Performed at [facility], [place] [address], [place], [place] [address]    Report Status 05/15/2019 FINAL  Final  Blood Culture (routine x 2)  Status: None (Preliminary result)   Collection Time: 05/13/19  9:25 PM   Specimen: BLOOD  Result Value Ref Range Status   Specimen Description BLOOD RIGHT ARM  Final   Special Requests   Final    BOTTLES DRAWN AEROBIC AND ANAEROBIC Blood Culture adequate volume   Culture   Final    NO GROWTH 4 DAYS Performed at [facility], [address], [place], [place] [address]    Report Status PENDING  Incomplete  Respiratory Panel by RT PCR (Flu A&B, Covid) - Nasopharyngeal Swab     Status: None   Collection Time: 05/13/19  9:39 PM   Specimen: Nasopharyngeal Swab  Result Value Ref Range Status   SARS Coronavirus 2 by RT PCR NEGATIVE NEGATIVE Final    Comment: (NOTE) SARS-CoV-2 target nucleic acids are NOT DETECTED. The SARS-CoV-2 RNA is generally detectable in upper respiratoy specimens during the acute phase of infection. The lowest concentration of SARS-CoV-2 viral copies this assay can detect is 131 copies/mL. A negative result does not preclude SARS-Cov-2 infection and should not be used as the sole basis for treatment or other patient management decisions. A negative result may occur with  improper specimen collection/handling, submission of specimen other than nasopharyngeal swab, presence of viral mutation(s) within the areas targeted by this assay, and inadequate number of viral copies (<131 copies/mL). A negative result must be combined with clinical observations, patient history, and epidemiological information. The expected result is Negative. Fact Sheet for Patients:   PinkCheek.be Fact Sheet for Healthcare Providers:  GravelBags.it This test is not yet ap proved or cleared by the [place] FDA and  has been authorized for detection and/or diagnosis of SARS-CoV-2 by FDA under an Emergency Use Authorization (EUA). This EUA will remain  in effect (meaning this test can be used) for the duration of the COVID-19 declaration under Section 564(b)(1) of the Act, 21 U.S.C. section 360bbb-3(b)(1), unless the authorization is terminated or revoked sooner.    Influenza A by PCR NEGATIVE NEGATIVE Final   Influenza B by PCR NEGATIVE NEGATIVE Final    Comment: (NOTE) The Xpert Xpress SARS-CoV-2/FLU/RSV assay is intended as an aid in  the diagnosis of influenza from Nasopharyngeal swab specimens and  should not be used as a sole basis for treatment. Nasal washings and  aspirates are unacceptable for Xpert Xpress SARS-CoV-2/FLU/RSV  testing. Fact Sheet for Patients: PinkCheek.be Fact Sheet for Healthcare Providers: GravelBags.it This test is not yet approved or cleared by the [place] FDA and  has been authorized for detection and/or diagnosis of SARS-CoV-2 by  FDA under an Emergency Use Authorization (EUA). This EUA will remain  in effect (meaning this test can be used) for the duration of the  Covid-19 declaration under Section 564(b)(1) of the Act, 21  U.S.C. section 360bbb-3(b)(1), unless the authorization is  terminated or revoked. Performed at [facility], [place] [address] [place], [place] [address]   MRSA PCR Screening     Status: None   Collection Time: 05/14/19  8:47 AM   Specimen: Nasal Mucosa; Nasopharyngeal  Result Value Ref Range Status   MRSA by PCR NEGATIVE NEGATIVE Final    Comment:        The GeneXpert MRSA Assay (FDA approved for NASAL specimens only), is one component of a comprehensive MRSA  colonization surveillance program. It is not intended to diagnose MRSA infection nor to guide or monitor treatment for MRSA infections. Performed at [facility], [place]  [address], [place], [place] [address]   Respiratory Panel by PCR     Status: None   Collection Time: 05/14/19 10:10 AM  Result Value Ref Range Status   Adenovirus NOT DETECTED NOT DETECTED Final   Coronavirus 229E NOT DETECTED NOT DETECTED Final    Comment: (NOTE) The Coronavirus on the Respiratory Panel, DOES NOT test for the novel  Coronavirus (2019 nCoV)    Coronavirus HKU1 NOT DETECTED NOT DETECTED Final   Coronavirus NL63 NOT DETECTED NOT DETECTED Final   Coronavirus OC43 NOT DETECTED NOT DETECTED Final   Metapneumovirus NOT DETECTED NOT DETECTED Final   Rhinovirus / Enterovirus NOT DETECTED NOT DETECTED Final   Influenza A NOT DETECTED NOT DETECTED Final   Influenza B NOT DETECTED NOT DETECTED Final   Parainfluenza Virus 1 NOT DETECTED NOT DETECTED Final   Parainfluenza Virus 2 NOT DETECTED NOT DETECTED Final   Parainfluenza Virus 3 NOT DETECTED NOT DETECTED Final   Parainfluenza Virus 4 NOT DETECTED NOT DETECTED Final   Respiratory Syncytial Virus NOT DETECTED NOT DETECTED Final   Bordetella pertussis NOT DETECTED NOT DETECTED Final   Chlamydophila pneumoniae NOT DETECTED NOT DETECTED Final   Mycoplasma pneumoniae NOT DETECTED NOT DETECTED Final    Comment: Performed at [facility], [place]. [address] [place], [place] [address]  Acid Fast Smear (AFB)     Status: None   Collection Time: 05/14/19  1:28 PM   Specimen: PATH Cytology Pleural fluid  Result Value Ref Range Status   AFB Specimen Processing Concentration  Final   Acid Fast Smear Negative  Final    Comment: (NOTE) Performed At: [facility] [place], [place] [id number] Rush Farmer MD [id number]    Source (AFB) PLEURAL  Final    Comment: FLUID Performed at [facility], [place] [address]  [place], [place] [address]   Culture, body fluid-bottle     Status: None (Preliminary result)   Collection Time: 05/14/19  1:28 PM   Specimen: Pleura  Result Value Ref Range Status   Specimen Description PLEURAL FLUID  Final   Special Requests BOTTLES DRAWN AEROBIC AND ANAEROBIC 10CC  Final   Culture   Final    NO GROWTH 3 DAYS Performed at [facility], [place] [address] [place], [place] [address]    Report Status PENDING  Incomplete  Gram stain     Status: None   Collection Time: 05/14/19  1:28 PM   Specimen: Pleura  Result Value Ref Range Status   Specimen Description PLEURAL FLUID  Final   Special Requests NONE  Final   Gram Stain   Final    MODERATE WBC PRESENT, PREDOMINANTLY MONONUCLEAR NO ORGANISMS SEEN Performed at [facility], [place] [address] [place], [place] [address]    Report Status 05/14/2019 FINAL  Final  Culture, group A strep     Status: None (Preliminary result)   Collection Time: 05/16/19  4:20 PM   Specimen: Throat  Result Value Ref Range Status   Specimen Description THROAT  Final   Special Requests NONE  Final   Culture   Final    CULTURE REINCUBATED FOR BETTER GROWTH Performed at [facility], [place] [address] [place], [place] [address]    Report Status PENDING  Incomplete     Labs: Basic Metabolic Panel: Recent Labs  Lab 05/13/19 1950 05/14/19 0255 05/14/19 1319 05/15/19 0309 05/17/19 0225  NA 136 138  --  138 138  K 3.5 3.0*  --  3.7 3.8  CL 100 102  --  102 104  CO2 22 23  --  25 24  GLUCOSE 245* 175*  --  148* 226*  BUN 16 15  --  11 14  CREATININE 1.14 1.05  --  0.99 0.84  CALCIUM 8.6* 8.4*  --  8.7* 8.4*  MG  --  1.8  --  2.0  --   PHOS  --   --  3.5 3.3  --    Liver Function Tests: Recent Labs  Lab 05/13/19 1950 05/15/19 0309  AST 19  --   ALT 15  --   ALKPHOS 75  --   BILITOT 1.2  --   PROT 5.7*  --   ALBUMIN 2.7* 2.2*   CBC: Recent Labs  Lab 05/13/19 1950 05/13/19 1950 05/14/19 0255 05/15/19 0309  05/16/19 0135 05/17/19 0225 05/18/19 0234  WBC 20.4*   < > 18.5* 18.7* 18.9* 15.5* 15.9*  NEUTROABS 17.0*  --  16.1* 14.6*  --   --   --   HGB 11.7*   < > 10.5* 10.4* 10.2* 10.8* 11.4*  HCT 37.5*   < > 33.2* 33.3* 32.3* 33.9* 36.1*  MCV 83.1   < > 82.6 82.4 82.8 82.3 81.7  PLT 391   < > 367 381 381 412* 473*   < > = values in this interval not displayed.   CBG: Recent Labs  Lab 05/17/19 0829 05/17/19 1300 05/17/19 1754 05/17/19 2144 05/18/19 0821  GLUCAP 207* 258* 187* 264* 221*   Hgb A1c No results for input(s): HGBA1C in the last 72 hours. Lipid Profile No results for input(s): CHOL, HDL, LDLCALC, TRIG, CHOLHDL, LDLDIRECT in the last 72 hours. Thyroid function studies No results for input(s): TSH, T4TOTAL, T3FREE, THYROIDAB in the last 72 hours.  Invalid input(s): FREET3 Urinalysis    Component Value Date/Time   COLORURINE YELLOW 05/13/2019 2048   APPEARANCEUR CLEAR 05/13/2019 2048   LABSPEC 1.027 05/13/2019 2048   PHURINE 5.0 05/13/2019 2048   GLUCOSEU >=500 (A) 05/13/2019 2048   HGBUR MODERATE (A) 05/13/2019 2048   BILIRUBINUR NEGATIVE 05/13/2019 2048   BILIRUBINUR Negative 03/24/2018 0841   KETONESUR NEGATIVE 05/13/2019 2048   PROTEINUR NEGATIVE 05/13/2019 2048   UROBILINOGEN 0.2 03/24/2018 0841   UROBILINOGEN 0.2 11/28/2010 1156   NITRITE NEGATIVE 05/13/2019 2048   LEUKOCYTESUR NEGATIVE 05/13/2019 2048    FURTHER DISCHARGE INSTRUCTIONS:   Get Medicines reviewed and adjusted: Please take all your medications with you for your next visit with your Primary MD   Laboratory/radiological data: Please request your Primary MD to go over all hospital tests and procedure/radiological results at the follow up, please ask your Primary MD to get all Hospital records sent to his/her office.   In some cases, they will be blood work, cultures and biopsy results pending at the time of your discharge. Please request that your primary care M.D. goes through all the records  of your hospital data and follows up on these results.   Also Note the following: If you experience worsening of your admission symptoms, develop shortness of breath, life threatening emergency, suicidal or homicidal thoughts you must seek medical attention immediately by calling 911 or calling your MD immediately  if symptoms less severe.   You must read complete instructions/literature along with all the possible adverse reactions/side effects for all the Medicines you take and that have been prescribed to you. Take any new Medicines after you have  completely understood and accpet all the possible adverse reactions/side effects.    Do not drive when taking Pain medications or sleeping medications (Benzodaizepines)   Do not take more than prescribed Pain, Sleep and Anxiety Medications. It is not advisable to combine anxiety,sleep and pain medications without talking with your primary care practitioner   Special Instructions: If you have smoked or chewed Tobacco  in the last 2 yrs please stop smoking, stop any regular Alcohol  and or any Recreational drug use.   Wear Seat belts while driving.   Please note: You were cared for by a hospitalist during your hospital stay. Once you are discharged, your primary care physician will handle any further medical issues. Please note that NO REFILLS for any discharge medications will be authorized once you are discharged, as it is imperative that you return to your primary care physician (or establish a relationship with a primary care physician if you do not have one) for your post hospital discharge needs so that they can reassess your need for medications and monitor your lab values.  Time coordinating discharge: 35 minutes  SIGNED:  Marzetta Board, MD, PhD 05/18/2019, 8:59 AM

## 2019-05-18 NOTE — TOC Transition Note (Signed)
Transition of Care [facility]) - CM/SW Discharge Note   Patient Details  Name: [name] MRN: [id number] Date of Birth: 1928-12-02  [facility]) CM/SW Contact:  Atilano Median, LCSW Phone Number: 05/18/2019, 12:05 PM   Clinical Narrative:     Discharged home with home health services. Patient's wife aware and agreeable to this plan. Family will transport. No other needs at this time. Case closed to this CSW.   Final next level of care: Home w Home Health Services Barriers to Discharge: Barriers Resolved   Patient Goals and CMS Choice   CMS Medicare.gov Compare Post Acute Care list provided to:: Patient Represenative (must comment) Choice offered to / list presented to : Spouse  Discharge Placement                       Discharge Plan and Services                          HH Arranged: PT [place] Agency: [facility] (formerly [employer]) Date [place]: 05/18/19 Time [place]: 1205 Representative spoke with at [place]: [place] ([place]) Interventions     Readmission Risk Interventions No flowsheet data found.

## 2019-05-19 LAB — CULTURE, BODY FLUID W GRAM STAIN -BOTTLE: Culture: NO GROWTH

## 2019-05-21 ENCOUNTER — Telehealth: Payer: Self-pay | Admitting: Family Medicine

## 2019-05-21 DIAGNOSIS — I340 Nonrheumatic mitral (valve) insufficiency: Secondary | ICD-10-CM | POA: Diagnosis not present

## 2019-05-21 DIAGNOSIS — Z96641 Presence of right artificial hip joint: Secondary | ICD-10-CM | POA: Diagnosis not present

## 2019-05-21 DIAGNOSIS — S96911D Strain of unspecified muscle and tendon at ankle and foot level, right foot, subsequent encounter: Secondary | ICD-10-CM | POA: Diagnosis not present

## 2019-05-21 DIAGNOSIS — E669 Obesity, unspecified: Secondary | ICD-10-CM | POA: Diagnosis not present

## 2019-05-21 DIAGNOSIS — I482 Chronic atrial fibrillation, unspecified: Secondary | ICD-10-CM | POA: Diagnosis not present

## 2019-05-21 DIAGNOSIS — J181 Lobar pneumonia, unspecified organism: Secondary | ICD-10-CM | POA: Diagnosis not present

## 2019-05-21 DIAGNOSIS — H9190 Unspecified hearing loss, unspecified ear: Secondary | ICD-10-CM | POA: Diagnosis not present

## 2019-05-21 DIAGNOSIS — M19041 Primary osteoarthritis, right hand: Secondary | ICD-10-CM | POA: Diagnosis not present

## 2019-05-21 DIAGNOSIS — I110 Hypertensive heart disease with heart failure: Secondary | ICD-10-CM | POA: Diagnosis not present

## 2019-05-21 DIAGNOSIS — N400 Enlarged prostate without lower urinary tract symptoms: Secondary | ICD-10-CM | POA: Diagnosis not present

## 2019-05-21 DIAGNOSIS — Z87440 Personal history of urinary (tract) infections: Secondary | ICD-10-CM | POA: Diagnosis not present

## 2019-05-21 DIAGNOSIS — E785 Hyperlipidemia, unspecified: Secondary | ICD-10-CM | POA: Diagnosis not present

## 2019-05-21 DIAGNOSIS — N320 Bladder-neck obstruction: Secondary | ICD-10-CM | POA: Diagnosis not present

## 2019-05-21 DIAGNOSIS — K5900 Constipation, unspecified: Secondary | ICD-10-CM | POA: Diagnosis not present

## 2019-05-21 DIAGNOSIS — M48061 Spinal stenosis, lumbar region without neurogenic claudication: Secondary | ICD-10-CM | POA: Diagnosis not present

## 2019-05-21 DIAGNOSIS — M19042 Primary osteoarthritis, left hand: Secondary | ICD-10-CM | POA: Diagnosis not present

## 2019-05-21 DIAGNOSIS — I2510 Atherosclerotic heart disease of native coronary artery without angina pectoris: Secondary | ICD-10-CM | POA: Diagnosis not present

## 2019-05-21 DIAGNOSIS — I5032 Chronic diastolic (congestive) heart failure: Secondary | ICD-10-CM | POA: Diagnosis not present

## 2019-05-21 DIAGNOSIS — E1142 Type 2 diabetes mellitus with diabetic polyneuropathy: Secondary | ICD-10-CM | POA: Diagnosis not present

## 2019-05-21 DIAGNOSIS — M5136 Other intervertebral disc degeneration, lumbar region: Secondary | ICD-10-CM | POA: Diagnosis not present

## 2019-05-21 DIAGNOSIS — Z794 Long term (current) use of insulin: Secondary | ICD-10-CM | POA: Diagnosis not present

## 2019-05-21 DIAGNOSIS — M170 Bilateral primary osteoarthritis of knee: Secondary | ICD-10-CM | POA: Diagnosis not present

## 2019-05-21 DIAGNOSIS — Z8781 Personal history of (healed) traumatic fracture: Secondary | ICD-10-CM | POA: Diagnosis not present

## 2019-05-21 DIAGNOSIS — Z7901 Long term (current) use of anticoagulants: Secondary | ICD-10-CM | POA: Diagnosis not present

## 2019-05-21 DIAGNOSIS — Z951 Presence of aortocoronary bypass graft: Secondary | ICD-10-CM | POA: Diagnosis not present

## 2019-05-21 NOTE — Telephone Encounter (Signed)
Cindee Salt from [place] [facility] calling requesting verbal orders for patient to receive physical therapy 2 times a week for 4 weeks. Focusing on balance & endurance. Please call back at [phone].

## 2019-05-22 ENCOUNTER — Ambulatory Visit (INDEPENDENT_AMBULATORY_CARE_PROVIDER_SITE_OTHER): Payer: Medicare Other | Admitting: Family Medicine

## 2019-05-22 ENCOUNTER — Other Ambulatory Visit: Payer: Self-pay

## 2019-05-22 VITALS — BP 138/70 | HR 36 | Temp 98.4°F | Ht 66.0 in | Wt 201.0 lb

## 2019-05-22 DIAGNOSIS — E782 Mixed hyperlipidemia: Secondary | ICD-10-CM | POA: Diagnosis not present

## 2019-05-22 DIAGNOSIS — J189 Pneumonia, unspecified organism: Secondary | ICD-10-CM | POA: Diagnosis not present

## 2019-05-22 DIAGNOSIS — R600 Localized edema: Secondary | ICD-10-CM

## 2019-05-22 DIAGNOSIS — E119 Type 2 diabetes mellitus without complications: Secondary | ICD-10-CM | POA: Diagnosis not present

## 2019-05-22 DIAGNOSIS — I482 Chronic atrial fibrillation, unspecified: Secondary | ICD-10-CM

## 2019-05-22 DIAGNOSIS — I350 Nonrheumatic aortic (valve) stenosis: Secondary | ICD-10-CM | POA: Diagnosis not present

## 2019-05-22 DIAGNOSIS — Z794 Long term (current) use of insulin: Secondary | ICD-10-CM

## 2019-05-22 DIAGNOSIS — I10 Essential (primary) hypertension: Secondary | ICD-10-CM

## 2019-05-22 DIAGNOSIS — I5032 Chronic diastolic (congestive) heart failure: Secondary | ICD-10-CM

## 2019-05-22 DIAGNOSIS — Z7901 Long term (current) use of anticoagulants: Secondary | ICD-10-CM | POA: Diagnosis not present

## 2019-05-22 LAB — CBC WITH DIFFERENTIAL/PLATELET
Basophils Absolute: 0.5 10*3/uL — ABNORMAL HIGH (ref 0.0–0.1)
Basophils Relative: 3.1 % — ABNORMAL HIGH (ref 0.0–3.0)
Eosinophils Absolute: 0.6 10*3/uL (ref 0.0–0.7)
Eosinophils Relative: 3.6 % (ref 0.0–5.0)
HCT: 35.8 % — ABNORMAL LOW (ref 39.0–52.0)
Hemoglobin: 11.3 g/dL — ABNORMAL LOW (ref 13.0–17.0)
Lymphocytes Relative: 12 % (ref 12.0–46.0)
Lymphs Abs: 1.8 10*3/uL (ref 0.7–4.0)
MCHC: 31.5 g/dL (ref 30.0–36.0)
MCV: 81.5 fl (ref 78.0–100.0)
Monocytes Absolute: 1.2 10*3/uL — ABNORMAL HIGH (ref 0.1–1.0)
Monocytes Relative: 7.5 % (ref 3.0–12.0)
Neutro Abs: 11.3 10*3/uL — ABNORMAL HIGH (ref 1.4–7.7)
Neutrophils Relative %: 73.8 % (ref 43.0–77.0)
Platelets: 589 10*3/uL — ABNORMAL HIGH (ref 150.0–400.0)
RBC: 4.39 Mil/uL (ref 4.22–5.81)
RDW: 17.6 % — ABNORMAL HIGH (ref 11.5–15.5)
WBC: 15.4 10*3/uL — ABNORMAL HIGH (ref 4.0–10.5)

## 2019-05-22 LAB — COMPREHENSIVE METABOLIC PANEL
ALT: 23 U/L (ref 0–53)
AST: 22 U/L (ref 0–37)
Albumin: 3.2 g/dL — ABNORMAL LOW (ref 3.5–5.2)
Alkaline Phosphatase: 93 U/L (ref 39–117)
BUN: 11 mg/dL (ref 6–23)
CO2: 32 mEq/L (ref 19–32)
Calcium: 8.7 mg/dL (ref 8.4–10.5)
Chloride: 102 mEq/L (ref 96–112)
Creatinine, Ser: 1.02 mg/dL (ref 0.40–1.50)
GFR: 68.77 mL/min (ref >60.00)
Glucose, Bld: 168 mg/dL — ABNORMAL HIGH (ref 70–99)
Potassium: 4.5 mEq/L (ref 3.5–5.1)
Sodium: 142 mEq/L (ref 135–145)
Total Bilirubin: 0.6 mg/dL (ref 0.2–1.2)
Total Protein: 6 g/dL (ref 6.0–8.3)

## 2019-05-22 LAB — POCT GLYCOSYLATED HEMOGLOBIN (HGB A1C): Hemoglobin A1C: 8.6 % — AB (ref 4.0–5.6)

## 2019-05-22 NOTE — Telephone Encounter (Signed)
Please give VO for PT> thanks

## 2019-05-22 NOTE — Telephone Encounter (Signed)
Ok'd VO for patient via voicemail on Cindee Salt from [facility].

## 2019-05-22 NOTE — Patient Instructions (Signed)
Please return in 3 months for diabetes follow up   If you have any questions or concerns, please don't hesitate to send me a message via MyChart or call the office at [phone]. Thank you for visiting with us today! It's our pleasure caring for you.  

## 2019-05-22 NOTE — Progress Notes (Signed)
Please call patient: I have reviewed his/her lab results. Lab results are all stable. Work on nutrition and increasing protein in his diet now that he is back home.

## 2019-05-22 NOTE — Progress Notes (Signed)
Subjective  CC:  Chief Complaint  Patient presents with  . Diabetes    checks dm once a day at home  . Hypertension    Denies HA, dizziness, or visual changes  . Hyperlipidemia    takes atorvastatin 40 mg daily  . Hospitalization Follow-up    left lower pneumonia    HPI: [name] is a 86 y.o. male who presents to the office today for follow up of diabetes and problems listed above in the chief complaint.  TOC/hospital follow up: I've personally reviewed recent office visit notes, hospital notes, associated labs and imaging reports and/or pertinent outside office records via chart review or CareEverywhere. Briefly, pt was admitted due to hypoxia and fever due to large LLL PNA with large effusion; hospital course:treated with abx andfluids. S/p 1L thoracentesis. Blood and fluid cultures negative throughout stay. covid negative. His pneumonia sxs improved over time however c/o persistent ST and foot pain. ENT performed laryngoscopy w/o source of pain identified. MRI of foot showed ligament strain. D/c 4 days ago and he is doing well. Only c/o ST at night. Eating and drinking well. Walking with cane w/o SOB or DOE. No chest pain. Cardiology saw him in hospital as well to monitor afib/bp and CHF. Pt with LE edema and improving on lasix. Due cbc and cmp today.   Diabetes follow up: His diabetic control is reported as Unchanged. Blood sugars were high with illness but are now trending back down. todays fasting was 118.  He denies exertional CP or SOB or symptomatic hypoglycemia. He denies foot sores or paresthesias.   Perm afib and chf: with edema. Continue lasix  On eloquis and cardizem  Foot pain is improving.   ST: ? Viral apthous ulcer or referred pain. Trial of hydrocodone (left over from back pain) and time. Further f/u with ENt if persists.  Wt Readings from Last 3 Encounters:  05/22/19 201 lb (91.2 kg)  05/17/19 205 lb 11 oz (93.3 kg)  05/12/19 188 lb (85.3 kg)    BP  Readings from Last 3 Encounters:  05/22/19 138/70  05/18/19 126/71  05/12/19 124/66    Assessment  1. Pneumonia of left lower lobe due to infectious organism   2. Diabetes mellitus type 2, insulin dependent ([place])   3. Essential (primary) hypertension   4. Aortic stenosis, moderate   5. Mixed hyperlipidemia   6. Chronic atrial fibrillation (HCC)   7. Current use of long term anticoagulation   8. Chronic diastolic CHF (congestive heart failure) ([place])   9. Lower extremity edema      Plan    TOC/hospital f/u LLL pneumonia w/ parapneumonic pleural effusion: improved. Recovering well. Respiratory status at baseline now. Eating and drinking well. Home PT ordered for balance and strengthening  Diabetes is currently adequately controlled. Goal a1c of 8.0. will recheck in 3 months.  Afib: rate controlled and on eloquis w/o bleed  CHF: continue lasix for now. Check lytes and renal function.   HTN is controlled.  On statin with good control  ST: trial of pain meds. Further eval if persists  Ligament strain in foot: firm soled shoe recommended.feeling better.   Follow up: 3 months recheck. Orders Placed This Encounter  Procedures  . CBC with Differential/Platelet  . Comprehensive metabolic panel  . POCT glycosylated hemoglobin (Hb A1C)   No orders of the defined types were placed in this encounter.     Immunization History  Administered Date(s) Administered  . Fluad Quad(high Dose 65+)  11/02/2018  . Influenza Split 11/14/2007, 01/01/2009, 12/23/2009  . Influenza, High Dose Seasonal PF 11/02/2012, 12/12/2013, 11/15/2014, 11/24/2017  . Influenza, Seasonal, Injecte, Preservative Fre 11/18/2015  . Influenza,trivalent, recombinat, inj, PF 12/07/2010  . Influenza-Unspecified 10/28/2011, 11/30/2014, 02/15/2017  . PFIZER SARS-COV-2 Vaccination 03/23/2019, 04/14/2019  . Pneumococcal Conjugate-13 12/13/2013  . Pneumococcal-Unspecified 08/20/2008  . Td 05/29/2002  . Tdap  10/28/2011, 02/15/2017  . Zoster 08/20/2008  . Zoster Recombinat (Shingrix) 10/13/2016, 12/13/2016    Diabetes Related Lab Review: Lab Results  Component Value Date   HGBA1C 8.6 (A) 05/22/2019   HGBA1C 8.5 (A) 02/06/2019   HGBA1C 7.7 (A) 10/04/2018    Lab Results  Component Value Date   MICROALBUR <0.7 03/14/2019   Lab Results  Component Value Date   CREATININE 0.84 05/17/2019   BUN 14 05/17/2019   NA 138 05/17/2019   K 3.8 05/17/2019   CL 104 05/17/2019   CO2 24 05/17/2019   Lab Results  Component Value Date   CHOL 143 10/04/2018   CHOL 146 12/07/2017   CHOL 159 07/20/2017   Lab Results  Component Value Date   HDL 44.60 10/04/2018   HDL 44.70 12/07/2017   HDL 42 07/20/2017   Lab Results  Component Value Date   LDLCALC 77 10/04/2018   LDLCALC 85 12/07/2017   LDLCALC 89 07/20/2017   Lab Results  Component Value Date   TRIG 106.0 10/04/2018   TRIG 78.0 12/07/2017   TRIG 139 07/20/2017   Lab Results  Component Value Date   CHOLHDL 3 10/04/2018   CHOLHDL 3 12/07/2017   CHOLHDL 4.0 06/07/2016   No results found for: LDLDIRECT The ASCVD Risk score Mikey Bussing DC Jr., et al., 2013) failed to calculate for the following reasons:   The 2013 ASCVD risk score is only valid for ages 67 to 2   The patient has a prior MI or stroke diagnosis I have reviewed the PMH, Fam and Soc history. Patient Active Problem List   Diagnosis Date Noted  . Aortic stenosis, moderate 05/22/2019    Priority: High  . Chronic diastolic CHF (congestive heart failure) ([place]) 03/14/2019    Priority: High  . Spinal stenosis of lumbar region 02/21/2018    Priority: High  . Diabetic peripheral neuropathy associated with type 2 diabetes mellitus ([place]) 05/05/2017    Priority: High    No pain   . Atherosclerotic heart disease of native coronary artery without angina pectoris 06/13/2016    Priority: High    Overview:  Overview:  RCA DES placed Nov 2016, LIMA-LAD presumed patent based on  Myoview but no visualized, SVG-Dx occluded  Last Assessment & Plan:  RCA DES placed Nov 2016, LIMA-LAD presumed patent based on Myoview but no visualized, SVG-Dx occluded   . CVA (cerebral vascular accident) ([place]) 06/06/2016    Priority: High  . Recurrent coronary arteriosclerosis after percutaneous transluminal coronary angioplasty 04/18/2015    Priority: High    Overview:  Overview:  RCA DES placed Nov 2016, LIMA-LAD presumed patent based on Myoview but no visualized, SVG-Dx occluded  Last Assessment & Plan:  RCA DES placed Nov 2016, LIMA-LAD presumed patent based on Myoview but no visualized, SVG-Dx occluded   . CAD S/P PCI- Nov 2016     Priority: High    RCA DES placed Nov 2016, LIMA-LAD presumed patent based on Myoview but no visualized, SVG-Dx occluded   . Current use of long term anticoagulation 02/06/2013    Priority: High    Overview:  Monitored by  cardiology   . Chronic atrial fibrillation ([place]) 07/30/2011    Priority: High    CHADs VASc=5 for age, HTN, vascular disease, and DM   . Hx of CABG x 2 1990     Priority: High    Status post CABG x2 in 1990 including an LIMA graft to the LAD, and a vein graft to the diagonal.    . Essential (primary) hypertension     Priority: High  . Diabetes mellitus type 2, insulin dependent ([place])     Priority: High  . Mixed hyperlipidemia     Priority: High  . Degeneration of lumbar intervertebral disc 02/21/2018    Priority: Medium  . Chronic vertigo 03/15/2014    Priority: Medium  . Obesity (BMI 30.0-34.9) 01/31/2012    Priority: Medium  . Enlarged prostate without lower urinary tract symptoms (luts) 01/04/2011    Priority: Medium    Overview:  Urology - avodart and tamuloscin   . Gastro-esophageal reflux disease without esophagitis 12/07/2010    Priority: Medium  . Epistaxis, recurrent 04/11/2015    Priority: Low  . Osteoarthritis, knee 01/31/2012    Priority: Low  . Allergic rhinitis 10/28/2011    Priority: Low  .  Hearing loss 06/08/2011    Priority: Low  . Primary osteoarthritis of hand 06/08/2011    Priority: Low  . Hematuria 08/27/2008    Priority: Low    Overview:  Essential Hematuria - urology - [place]rapey  w/u benign  10/1 IMO update Overview:  Overview:  Essential Hematuria - urology - Grapey  w/u benign   . Lower extremity edema 03/14/2019    Social History: Patient  reports that he quit smoking about 61 years ago. His smoking use included cigarettes. He has a 30.00 pack-year smoking history. He has never used smokeless tobacco. He reports that he does not drink alcohol or use drugs.  Review of Systems: Ophthalmic: negative for eye pain, loss of vision or double vision Cardiovascular: negative for chest pain Respiratory: negative for SOB or persistent cough Gastrointestinal: negative for abdominal pain Genitourinary: negative for dysuria or gross hematuria MSK: negative for foot lesions Neurologic: negative for weakness or gait disturbance  Objective  Vitals: BP 138/70 (BP Location: Right Arm, Patient Position: Sitting, Cuff Size: Normal)   Pulse (!) 36   Temp 98.4 F (36.9 C) (Temporal)   Ht 5\' 6"  (1.676 m)   Wt 201 lb (91.2 kg)   SpO2 93%   BMI 32.44 kg/m  General: well appearing, no acute distress  Psych:  Alert and oriented, normal mood and affect HEENT:  Normocephalic, atraumatic,supple neck  Cardiovascular:  IRREG IRREG w/ loud murmur Respiratory:  Good breath sounds bilaterally, CTAB with normal effort, no rales Skin:  Warm, no rashes Ext +2 pitting edema     Commons side effects, risks, benefits, and alternatives for medications and treatment plan prescribed today were discussed, and the patient expressed understanding of the given instructions. Patient is instructed to call or message via MyChart if he/she has any questions or concerns regarding our treatment plan. No barriers to understanding were identified. We discussed Red Flag symptoms and signs in  detail. Patient expressed understanding regarding what to do in case of urgent or emergency type symptoms.   Medication list was reconciled, printed and provided to the patient in AVS. Patient instructions and summary information was reviewed with the patient as documented in the AVS. This note was prepared with assistance of Dragon voice recognition software. Occasional wrong-word or sound-a-like substitutions  may have occurred due to the inherent limitations of voice recognition software  This visit occurred during the SARS-CoV-2 public health emergency.  Safety protocols were in place, including screening questions prior to the visit, additional usage of staff PPE, and extensive cleaning of exam room while observing appropriate contact time as indicated for disinfecting solutions.

## 2019-05-29 NOTE — Progress Notes (Signed)
Cardiology Office Note   Date:  05/30/2019   ID:  [name], DOB 1928-05-11, MRN [id number]  PCP:  [name], MD  Cardiologist:  Peter Martinique, MD EP: None  Chief Complaint  Patient presents with  . Hospitalization Follow-up    Afib and CHF      History of Present Illness: [name] is a 86 y.o. male with PMH of CAD s/p CABG in 1990 with subsequent DES to RCA in 2006, permanent atrial fibrillation, chronic diastolic CHF, aortic stenosis, HTN, HLD, carotid artery disease, DM type 2, CVA, who presents for post-hospital follow-up.   He was recently admitted to the hospital 05/13/19-05/18/19 for acute respiratory failure 2/2 PNA and pleural effusion, managed with IV antibiotics and a thoracentesis. Cardiology followed that admission for assistance with atrial fibrillation with RVR and he was discharged home on diltiazem 360mg  and metoprolol succinate 100mg  BID for rate control, apixaban 5mg  BID for stroke ppx, and lasix 40mg  daily for CHF. Echo 05/14/19 showed EF 60-65%, moderate concentric LVH, indeterminate LV diastolic function, mild AI, moderate AS, and ascending aortic aneurysm of 4.8cm (up from 4.3cm 09/2018).   He presents today for post-hospital follow-up.He has been doing well since discharge from the hospital. Energy has perked up and he has been getting his strength back. Looking forward to spending the Easter holiday with family in [place]. He has no complaints of SOB, chest pain, LE edema, palpitations, racing heart beat sensation, orthopnea, PND, dizziness, lightheadedness, or syncope. He and his wife are fully vaccinated against COVID 65. He anticipates moving to [place] within the next year to be closer to family.     Past Medical History:  Diagnosis Date  . Abdominal pain   . Acute renal failure ([place])     resolved  . Arthritis    "hands & legs" (11/'10/2014)  . Ataxia   . Atrial fibrillation ([place])   . Bladder outlet obstruction   . Bladder outlet  obstruction   . BPH (benign prostatic hyperplasia)   . CAD (coronary artery disease)    a. CABG IN 1989. b. 01/08/2015 CTO of ost LAD, LIMA to LAD not visualized but assumed patent given myoview finding, occluded SVG to diagonal, 99% mid RCA tx w/ SYNERGY DES 3X28 mm  . Constipation   . Diabetes mellitus type 2, insulin dependent ([place])   . Epistaxis, recurrent Feb 2017  . GERD (gastroesophageal reflux disease)   . HTN (hypertension)   . Hx of bacterial pneumonia   . Hyperlipemia   . Kidney stones   . Mild aortic stenosis   . Pneumonia 04/2019  . Rib fractures    left rib fractures being treated with pain medications  . Stented coronary artery Nov 2016   RCA DES  . Urinary tract infection     Enterococcus    Past Surgical History:  Procedure Laterality Date  . CARDIAC CATHETERIZATION  1989  . CARDIAC CATHETERIZATION N/A 01/08/2015   Procedure: Left Heart Cath and Cors/Grafts Angiography;  Surgeon: Peter M Martinique, MD;  Location: [facility];  Service: Cardiovascular;  Laterality: N/A;  . CARDIAC CATHETERIZATION  01/08/2015   Procedure: Coronary Stent Intervention;  Surgeon: Peter M Martinique, MD;  Location: [facility];  Service: Cardiovascular;;  . CARPAL TUNNEL RELEASE Right 08/2010  . CATARACT EXTRACTION W/ INTRAOCULAR LENS  IMPLANT, BILATERAL Bilateral   . CORONARY ANGIOPLASTY  01/08/15   RCA DES  . CORONARY ARTERY BYPASS GRAFT  1989   "CABG  X 2"  . IR THORACENTESIS ASP PLEURAL SPACE W/IMG GUIDE  05/14/2019  . JOINT REPLACEMENT    . TONSILLECTOMY    . TOTAL HIP ARTHROPLASTY Right 2000     Current Outpatient Medications  Medication Sig Dispense Refill  . apixaban (ELIQUIS) 5 MG TABS tablet Take 1 tablet (5 mg total) by mouth 2 (two) times daily. 60 tablet 2  . atorvastatin (LIPITOR) 40 MG tablet Take 1 tablet (40 mg total) by mouth daily at 6 PM. 30 tablet 2  . AVODART 0.5 MG capsule Take 0.5 mg by mouth every Monday, Wednesday, and Friday.     [name] azelastine  (ASTELIN) 0.1 % nasal spray Place 1 spray into both nostrils 2 (two) times daily. Use in each nostril as directed 30 mL 12  . cefdinir (OMNICEF) 300 MG capsule Take 1 capsule (300 mg total) by mouth 2 (two) times daily. 8 capsule 0  . diltiazem (CARDIZEM CD) 360 MG 24 hr capsule Take 1 capsule (360 mg total) by mouth daily. 30 capsule 6  . empagliflozin (JARDIANCE) 10 MG TABS tablet Take 5 mg by mouth daily.     . furosemide (LASIX) 40 MG tablet Take 40 mg by mouth daily.     [name] gabapentin (NEURONTIN) 300 MG capsule Take 1 capsule (300 mg total) by mouth 3 (three) times daily. 270 capsule 3  . insulin glargine (LANTUS) 100 UNIT/ML injection Inject 38 Units into the skin every morning.     . metFORMIN (GLUCOPHAGE) 500 MG tablet Take 500 mg by mouth 2 (two) times daily with a meal.    . metoprolol succinate (TOPROL-XL) 100 MG 24 hr tablet TAKE 1 TABLET BY MOUTH TWICE DAILY (Patient taking differently: Take 100 mg by mouth 2 (two) times daily. ) 180 tablet 3  . nitroGLYCERIN (NITROSTAT) 0.4 MG SL tablet Place 0.4 mg under the tongue every 5 (five) minutes as needed for chest pain (x 3 doses).    . pantoprazole (PROTONIX) 40 MG tablet TAKE 1 TABLET BY MOUTH DAILY AT NOON (Patient taking differently: Take 40 mg by mouth daily with lunch. ) 90 tablet 2  . potassium chloride SA (KLOR-CON) 20 MEQ tablet Take 20 mEq by mouth daily.    [name] PRECISION XTRA TEST STRIPS test strip USE TO TEST BLOOD SUGAR 1 TO 2 TIMES DAILY 200 strip 6  . Tamsulosin HCl (FLOMAX) 0.4 MG CAPS Take 0.4 mg by mouth every Tuesday, Thursday, Saturday, and Sunday.      No current facility-administered medications for this visit.    Allergies:   Erythromycin base, Cefadroxil, Ciprofloxacin, and Erythromycin    Social History:  The patient  reports that he quit smoking about 61 years ago. His smoking use included cigarettes. He has a 30.00 pack-year smoking history. He has never used smokeless tobacco. He reports that he does not drink  alcohol or use drugs.   Family History:  The patient's family history includes Brain cancer in his father; Throat cancer in his brother.    ROS:  Please see the history of present illness.   Otherwise, review of systems are positive for none.   All other systems are reviewed and negative.    PHYSICAL EXAM: VS:  BP 128/82   Pulse (!) 102   Ht 5\' 6"  (1.676 m)   Wt 196 lb 12.8 oz (89.3 kg)   SpO2 99%   BMI 31.76 kg/m  , BMI Body mass index is 31.76 kg/m. GEN: Well nourished, well developed, in no  acute distress HEENT: normal Neck: no JVD, carotid bruits, or masses Cardiac: IRIR; no murmurs, rubs, or gallops, trace LE edema  Respiratory:  clear to auscultation bilaterally, normal work of breathing GI: soft, nontender, nondistended, + BS MS: no deformity or atrophy Skin: warm and dry, no rash Neuro:  Strength and sensation are intact Psych: euthymic mood, full affect   EKG:  EKG is ordered today. The ekg ordered today demonstrates atrial fibrillation with RVR, rate 102 bpm, non-specific T wave abnormalities, no STE/D, no significant chagne from previous.    Recent Labs: 10/04/2018: TSH 1.57 05/14/2019: B Natriuretic Peptide 272.9 05/15/2019: Magnesium 2.0 05/22/2019: ALT 23; BUN 11; Creatinine, Ser 1.02; Hemoglobin 11.3; Platelets 589.0; Potassium 4.5; Sodium 142    Lipid Panel    Component Value Date/Time   CHOL 143 10/04/2018 1450   TRIG 106.0 10/04/2018 1450   HDL 44.60 10/04/2018 1450   CHOLHDL 3 10/04/2018 1450   VLDL 21.2 10/04/2018 1450   LDLCALC 77 10/04/2018 1450      Wt Readings from Last 3 Encounters:  05/30/19 196 lb 12.8 oz (89.3 kg)  05/22/19 201 lb (91.2 kg)  05/17/19 205 lb 11 oz (93.3 kg)      Other studies Reviewed: Additional studies/ records that were reviewed today include:   2D echo 05/14/2019 IMPRESSIONS   1. Left ventricular ejection fraction, by estimation, is 60 to 65%. The  left ventricle has normal function. The left ventricle has no  regional  wall motion abnormalities. There is moderate concentric left ventricular  hypertrophy. Left ventricular  diastolic parameters are indeterminate.  2. Right ventricular systolic function is normal. The right ventricular  size is normal.  3. The mitral valve is normal in structure. No evidence of mitral valve  regurgitation. No evidence of mitral stenosis.  4. The aortic valve is normal in structure. Aortic valve regurgitation is  mild. Moderate aortic valve stenosis. Aortic valve area, by VTI measures  1.27 cm. Aortic valve mean gradient measures 19.3 mmHg. Aortic valve Vmax  measures 2.75 m/s.  5. Compared with th echo 09/2018, ascending aorta has increased from 4.3  cm to 4.8cm. . Aortic dilatation noted. There is moderate dilatation of  the ascending aorta measuring 48 mm.  6. The inferior vena cava is normal in size with greater than 50%  respiratory variability, suggesting right atrial pressure of 3 mmHg.   Left heart catheterization 2016:  Prox RCA lesion, 40% stenosed.  LM lesion, 20% stenosed.  Ost LAD to Prox LAD lesion, 100% stenosed.  SVG .  Origin lesion, 100% stenosed.  Mid RCA-2 lesion, 70% stenosed.  Mid RCA-1 lesion, 99% stenosed. Post intervention, there is a 0% residual stenosis.   1. Severe 2 vessel obstructive CAD. CTO of the origin of the LAD. Critical mid RCA stenosis. 2. Occluded SVG to the diagonal 3. LIMA to the LAD was not visualized but assumed patent based on clinical history and Myoview findings. 4. Normal LV EDP. 5. Successful stenting of the Mid RCA with DES. Very difficult procedure due to vessel tortuosity.  Plan: DAPT with ASA and Plavix for one month then stop ASA and continue Plavix for at least one year. Resume Coumadin tomorrow. Will assess LV function with an Echo. Stop prilosec and start protonix. Anticipate DC in am if stable. Patient noted to be bradycardic throughout case so will reduce metoprolol to 50 mg  daily.  ASSESSMENT AND PLAN:  1. CAD s/p CABG in 1990 with DES to RCA in 2006: no  anginal complaints. Not on aspirin given need for anticoagulation - Continue statin - Continue metoprolol  2. Permanent atrial fibrillation: HR was 102 on EKG today, regular on exam. No complaints of bleeding - Continue metoprolol succinate and diltiazem for rate control - Continue eliquis for stroke ppx. (does not meet requirements for reduced dose)  3. Chronic diastolic CHF: no volume overload complaints and he appears euvolemic on exam - Continue lasix 40mg  daily - Continue metoprolol succinate - Continue to limit salt intake  4. HTN: BP 128/82 today - Continue metoprolol succinate, diltiazem, and lasix  5. Aortic stenosis: moderate on echo 05/14/19 - Continue annual surveillance echo's  6. Ascending aortic aneurysm: recent echo 05/14/19 showed 4.8cm aneurysm, up from 4.3 09/2018 - Favor ongoing monitoring q6 months given recent interval change   Current medicines are reviewed at length with the patient today.  The patient does not have concerns regarding medicines.  The following changes have been made:  As above  Labs/ tests ordered today include:  No orders of the defined types were placed in this encounter.    Disposition:   FU with Dr. Martinique as scheduled in ay 2021  Signed, Abigail Butts, [name]  05/30/2019 11:56 AM

## 2019-05-30 ENCOUNTER — Encounter: Payer: Self-pay | Admitting: Medical

## 2019-05-30 ENCOUNTER — Other Ambulatory Visit: Payer: Self-pay

## 2019-05-30 ENCOUNTER — Ambulatory Visit (INDEPENDENT_AMBULATORY_CARE_PROVIDER_SITE_OTHER): Payer: Medicare Other | Admitting: Medical

## 2019-05-30 VITALS — BP 128/82 | HR 102 | Ht 66.0 in | Wt 196.8 lb

## 2019-05-30 DIAGNOSIS — I482 Chronic atrial fibrillation, unspecified: Secondary | ICD-10-CM | POA: Diagnosis not present

## 2019-05-30 DIAGNOSIS — I5032 Chronic diastolic (congestive) heart failure: Secondary | ICD-10-CM | POA: Diagnosis not present

## 2019-05-30 DIAGNOSIS — I350 Nonrheumatic aortic (valve) stenosis: Secondary | ICD-10-CM | POA: Diagnosis not present

## 2019-05-30 DIAGNOSIS — I2510 Atherosclerotic heart disease of native coronary artery without angina pectoris: Secondary | ICD-10-CM | POA: Diagnosis not present

## 2019-05-30 DIAGNOSIS — I10 Essential (primary) hypertension: Secondary | ICD-10-CM | POA: Diagnosis not present

## 2019-05-30 DIAGNOSIS — I712 Thoracic aortic aneurysm, without rupture: Secondary | ICD-10-CM | POA: Diagnosis not present

## 2019-05-30 DIAGNOSIS — I7121 Aneurysm of the ascending aorta, without rupture: Secondary | ICD-10-CM

## 2019-05-30 NOTE — Patient Instructions (Signed)
Medication Instructions:   Your physician recommends that you continue on your current medications as directed. Please refer to the Current Medication list given to you today.  *If you need a refill on your cardiac medications before your next appointment, please call your pharmacy*   Lab Work: [place]   If you have labs (blood work) drawn today and your tests are completely normal, you will receive your results only by: [name] MyChart Message (if you have MyChart) OR . A paper copy in the mail If you have any lab test that is abnormal or we need to change your treatment, we will call you to review the results.   Testing/Procedures: NONE ORDERED  TODAY    Follow-Up: At [facility], you and your health needs are our priority.  As part of our continuing mission to provide you with exceptional heart care, we have created designated Provider Care Teams.  These Care Teams include your primary Cardiologist (physician) and Advanced Practice Providers (APPs -  Physician Assistants and Nurse Practitioners) who all work together to provide you with the care you need, when you need it.  We recommend signing up for the patient portal called "MyChart".  Sign up information is provided on this After Visit Summary.  MyChart is used to connect with patients for Virtual Visits (Telemedicine).  Patients are able to view lab/test results, encounter notes, upcoming appointments, etc.  Non-urgent messages can be sent to your provider as well.   To learn more about what you can do with MyChart, go to [email or web address].    Your next appointment:   AS SCHEDULED    The format for your next appointment:  IN PERSON   Provider: AS SCHEDULED     Other Instructions

## 2019-06-06 ENCOUNTER — Other Ambulatory Visit (INDEPENDENT_AMBULATORY_CARE_PROVIDER_SITE_OTHER): Payer: Medicare Other

## 2019-06-06 DIAGNOSIS — I10 Essential (primary) hypertension: Secondary | ICD-10-CM

## 2019-06-14 ENCOUNTER — Other Ambulatory Visit: Payer: Self-pay | Admitting: Cardiology

## 2019-06-15 ENCOUNTER — Encounter: Payer: Self-pay | Admitting: Podiatry

## 2019-06-15 ENCOUNTER — Ambulatory Visit (INDEPENDENT_AMBULATORY_CARE_PROVIDER_SITE_OTHER): Payer: Medicare Other | Admitting: Podiatry

## 2019-06-15 ENCOUNTER — Other Ambulatory Visit: Payer: Self-pay

## 2019-06-15 VITALS — Temp 97.9°F

## 2019-06-15 DIAGNOSIS — B351 Tinea unguium: Secondary | ICD-10-CM | POA: Diagnosis not present

## 2019-06-15 DIAGNOSIS — M79674 Pain in right toe(s): Secondary | ICD-10-CM

## 2019-06-15 DIAGNOSIS — E119 Type 2 diabetes mellitus without complications: Secondary | ICD-10-CM | POA: Diagnosis not present

## 2019-06-15 DIAGNOSIS — D689 Coagulation defect, unspecified: Secondary | ICD-10-CM | POA: Diagnosis not present

## 2019-06-15 NOTE — Progress Notes (Signed)
This patient returns to my office for at risk foot care.  This patient requires this care by a professional since this patient will be at risk due to having diabetic neuropathy and coagulation defect.  Patient is taking eliquiss.  This patient is unable to cut nails himself since the patient cannot reach his nails.These nails are painful walking and wearing shoes.  This patient presents for at risk foot care today.  General Appearance  Alert, conversant and in no acute stress.  Vascular  Dorsalis pedis and posterior tibial  pulses are palpable  bilaterally.  Capillary return is within normal limits  bilaterally. Temperature is within normal limits  bilaterally.  Neurologic  Senn-Weinstein monofilament wire test within normal limits  bilaterally. Muscle power within normal limits bilaterally.  Nails Thick disfigured discolored nails with subungual debris  from hallux to fifth toes bilaterally. No evidence of bacterial infection or drainage bilaterally.  Orthopedic  No limitations of motion  feet .  No crepitus or effusions noted.  No bony pathology or digital deformities noted.  Skin  normotropic skin with no porokeratosis noted bilaterally.  No signs of infections or ulcers noted.     Onychomycosis  Pain in right toes  Pain in left toes  Consent was obtained for treatment procedures.   Mechanical debridement of nails 1-5  bilaterally performed with a nail nipper.  Filed with dremel without incident.    Return office visit                     Told patient to return for periodic foot care and evaluation due to potential at risk complications.   Gardiner Barefoot DPM

## 2019-06-20 DIAGNOSIS — J90 Pleural effusion, not elsewhere classified: Secondary | ICD-10-CM | POA: Diagnosis not present

## 2019-06-27 LAB — ACID FAST CULTURE WITH REFLEXED SENSITIVITIES (MYCOBACTERIA): Acid Fast Culture: NEGATIVE

## 2019-07-17 NOTE — Progress Notes (Signed)
Cardiology Office Note   Date:  07/19/2019   ID:  [name], DOB October 19, 1928, MRN [id number]  PCP:  [name], MD  Cardiologist:  [name], MD EP: None  Chief Complaint  Patient presents with  . Coronary Artery Disease  . Atrial Fibrillation      History of Present Illness: [name] is a 86 y.o. male with PMH of CAD s/p CABG in 1990 with subsequent DES to RCA in 2006, permanent atrial fibrillation, chronic diastolic CHF, aortic stenosis, HTN, HLD, carotid artery disease, DM type 2, CVA, who presents for  follow-up.   He was recently admitted to the hospital 05/13/19-05/18/19 for acute respiratory failure 2/2 PNA and pleural effusion, managed with IV antibiotics and a thoracentesis. Cardiology followed that admission for assistance with atrial fibrillation with RVR and he was discharged home on diltiazem 360mg  and metoprolol succinate 100mg  BID for rate control, apixaban 5mg  BID for stroke ppx, and lasix 40mg  daily for CHF. Echo 05/14/19 showed EF 60-65%, moderate concentric LVH, indeterminate LV diastolic function, mild AI, moderate AS, and ascending aortic aneurysm of 4.8cm (up from 4.3cm 09/2018).   On follow up today he is doing well. Has recovered from his PNA well. Denies any dyspnea, palpitations, dizziness or chest pain. Tried to stop lasix but swelling came back so he resumed. No bleeding. No swelling now.     Past Medical History:  Diagnosis Date  . Abdominal pain   . Acute renal failure ([place])     resolved  . Arthritis    "hands & legs" (11/'10/2014)  . Ataxia   . Atrial fibrillation ([place])   . Bladder outlet obstruction   . Bladder outlet obstruction   . BPH (benign prostatic hyperplasia)   . CAD (coronary artery disease)    a. CABG IN 1989. b. 01/08/2015 CTO of ost LAD, LIMA to LAD not visualized but assumed patent given myoview finding, occluded SVG to diagonal, 99% mid RCA tx w/ SYNERGY DES 3X28 mm  . Constipation   . Diabetes mellitus type 2,  insulin dependent ([place])   . Epistaxis, recurrent Feb 2017  . GERD (gastroesophageal reflux disease)   . HTN (hypertension)   . Hx of bacterial pneumonia   . Hyperlipemia   . Kidney stones   . Mild aortic stenosis   . Pneumonia 04/2019  . Rib fractures    left rib fractures being treated with pain medications  . Stented coronary artery Nov 2016   RCA DES  . Urinary tract infection     Enterococcus    Past Surgical History:  Procedure Laterality Date  . CARDIAC CATHETERIZATION  1989  . CARDIAC CATHETERIZATION N/A 01/08/2015   Procedure: Left Heart Cath and Cors/Grafts Angiography;  Surgeon: [name], MD;  Location: [facility];  Service: Cardiovascular;  Laterality: N/A;  . CARDIAC CATHETERIZATION  01/08/2015   Procedure: Coronary Stent Intervention;  Surgeon: [name], MD;  Location: [facility];  Service: Cardiovascular;;  . CARPAL TUNNEL RELEASE Right 08/2010  . CATARACT EXTRACTION W/ INTRAOCULAR LENS  IMPLANT, BILATERAL Bilateral   . CORONARY ANGIOPLASTY  01/08/15   RCA DES  . CORONARY ARTERY BYPASS GRAFT  1989   "CABG X 2"  . IR THORACENTESIS ASP PLEURAL SPACE W/IMG GUIDE  05/14/2019  . JOINT REPLACEMENT    . TONSILLECTOMY    . TOTAL HIP ARTHROPLASTY Right 2000     Current Outpatient Medications  Medication Sig Dispense Refill  . apixaban (  ELIQUIS) 5 MG TABS tablet Take 1 tablet (5 mg total) by mouth 2 (two) times daily. 60 tablet 2  . atorvastatin (LIPITOR) 40 MG tablet Take 1 tablet (40 mg total) by mouth daily at 6 PM. 30 tablet 2  . AVODART 0.5 MG capsule Take 0.5 mg by mouth every Monday, Wednesday, and Friday.     [name] azelastine (ASTELIN) 0.1 % nasal spray Place 1 spray into both nostrils 2 (two) times daily. Use in each nostril as directed 30 mL 12  . cefdinir (OMNICEF) 300 MG capsule Take 1 capsule (300 mg total) by mouth 2 (two) times daily. 8 capsule 0  . diltiazem (CARDIZEM CD) 360 MG 24 hr capsule Take 1 capsule (360 mg total) by mouth  daily. 30 capsule 6  . empagliflozin (JARDIANCE) 10 MG TABS tablet Take 5 mg by mouth daily.     . furosemide (LASIX) 40 MG tablet Take 40 mg by mouth daily.     [name] gabapentin (NEURONTIN) 300 MG capsule Take 1 capsule (300 mg total) by mouth 3 (three) times daily. 270 capsule 3  . insulin glargine (LANTUS) 100 UNIT/ML injection Inject 38 Units into the skin every morning.     . metFORMIN (GLUCOPHAGE) 500 MG tablet Take 500 mg by mouth 2 (two) times daily with a meal.    . metoprolol succinate (TOPROL-XL) 100 MG 24 hr tablet TAKE 1 TABLET BY MOUTH TWICE DAILY (Patient taking differently: Take 100 mg by mouth 2 (two) times daily. ) 180 tablet 3  . nitroGLYCERIN (NITROSTAT) 0.4 MG SL tablet Place 0.4 mg under the tongue every 5 (five) minutes as needed for chest pain (x 3 doses).    . pantoprazole (PROTONIX) 40 MG tablet TAKE 1 TABLET BY MOUTH DAILY AT NOON 90 tablet 3  . potassium chloride SA (KLOR-CON) 20 MEQ tablet Take 20 mEq by mouth daily.    [name] PRECISION XTRA TEST STRIPS test strip USE TO TEST BLOOD SUGAR 1 TO 2 TIMES DAILY 200 strip 6  . Tamsulosin HCl (FLOMAX) 0.4 MG CAPS Take 0.4 mg by mouth every Tuesday, Thursday, Saturday, and Sunday.      No current facility-administered medications for this visit.    Allergies:   Erythromycin base, Cefadroxil, Ciprofloxacin, and Erythromycin    Social History:  The patient  reports that he quit smoking about 61 years ago. His smoking use included cigarettes. He has a 30.00 pack-year smoking history. He has never used smokeless tobacco. He reports that he does not drink alcohol or use drugs.   Family History:  The patient's family history includes Brain cancer in his father; Throat cancer in his brother.    ROS:  Please see the history of present illness.   Otherwise, review of systems are positive for none.   All other systems are reviewed and negative.    PHYSICAL EXAM: VS:  BP 124/72   Pulse 72   Ht 5\' 6"  (1.676 m)   Wt 200 lb 3.2 oz (90.8  kg)   SpO2 98%   BMI 32.31 kg/m  , BMI Body mass index is 32.31 kg/m. GEN: Well nourished, well developed, in no acute distress HEENT: normal Neck: no JVD, carotid bruits, or masses Cardiac: IRIR; gr 2/6 systolic murmur RUSB, rubs, or gallops, trace LE edema  Respiratory:  clear to auscultation bilaterally, normal work of breathing GI: soft, nontender, nondistended, + BS MS: no deformity or atrophy Skin: warm and dry, no rash Neuro:  Strength and sensation are intact  Psych: euthymic mood, full affect   EKG:  EKG is not ordered today.   Recent Labs: 10/04/2018: TSH 1.57 05/14/2019: B Natriuretic Peptide 272.9 05/15/2019: Magnesium 2.0 05/22/2019: ALT 23; BUN 11; Creatinine, Ser 1.02; Hemoglobin 11.3; Platelets 589.0; Potassium 4.5; Sodium 142    Lipid Panel    Component Value Date/Time   CHOL 143 10/04/2018 1450   TRIG 106.0 10/04/2018 1450   HDL 44.60 10/04/2018 1450   CHOLHDL 3 10/04/2018 1450   VLDL 21.2 10/04/2018 1450   LDLCALC 77 10/04/2018 1450      Wt Readings from Last 3 Encounters:  07/19/19 200 lb 3.2 oz (90.8 kg)  05/30/19 196 lb 12.8 oz (89.3 kg)  05/22/19 201 lb (91.2 kg)      Other studies Reviewed: Additional studies/ records that were reviewed today include:   2D echo 05/14/2019 IMPRESSIONS   1. Left ventricular ejection fraction, by estimation, is 60 to 65%. The  left ventricle has normal function. The left ventricle has no regional  wall motion abnormalities. There is moderate concentric left ventricular  hypertrophy. Left ventricular  diastolic parameters are indeterminate.  2. Right ventricular systolic function is normal. The right ventricular  size is normal.  3. The mitral valve is normal in structure. No evidence of mitral valve  regurgitation. No evidence of mitral stenosis.  4. The aortic valve is normal in structure. Aortic valve regurgitation is  mild. Moderate aortic valve stenosis. Aortic valve area, by VTI measures  1.27 cm.  Aortic valve mean gradient measures 19.3 mmHg. Aortic valve Vmax  measures 2.75 m/s.  5. Compared with th echo 09/2018, ascending aorta has increased from 4.3  cm to 4.8cm. . Aortic dilatation noted. There is moderate dilatation of  the ascending aorta measuring 48 mm.  6. The inferior vena cava is normal in size with greater than 50%  respiratory variability, suggesting right atrial pressure of 3 mmHg.   Left heart catheterization 2016:  Prox RCA lesion, 40% stenosed.  LM lesion, 20% stenosed.  Ost LAD to Prox LAD lesion, 100% stenosed.  SVG .  Origin lesion, 100% stenosed.  Mid RCA-2 lesion, 70% stenosed.  Mid RCA-1 lesion, 99% stenosed. Post intervention, there is a 0% residual stenosis.   1. Severe 2 vessel obstructive CAD. CTO of the origin of the LAD. Critical mid RCA stenosis. 2. Occluded SVG to the diagonal 3. LIMA to the LAD was not visualized but assumed patent based on clinical history and Myoview findings. 4. Normal LV EDP. 5. Successful stenting of the Mid RCA with DES. Very difficult procedure due to vessel tortuosity.  Plan: DAPT with ASA and Plavix for one month then stop ASA and continue Plavix for at least one year. Resume Coumadin tomorrow. Will assess LV function with an Echo. Stop prilosec and start protonix. Anticipate DC in am if stable. Patient noted to be bradycardic throughout case so will reduce metoprolol to 50 mg daily.  ASSESSMENT AND PLAN:  1. CAD s/p CABG in 1990 with DES to RCA in 2006: no anginal complaints. Not on aspirin given need for anticoagulation - Continue statin - Continue metoprolol  2. Permanent atrial fibrillation: HR is well controlled  - No complaints of bleeding - Continue metoprolol succinate and diltiazem for rate control - Continue eliquis for stroke ppx. (does not meet requirements for reduced dose)  3. Chronic diastolic CHF: no volume overload complaints and he appears euvolemic on exam - Continue lasix 40mg  daily and  potassium - Continue metoprolol succinate -  Continue to limit salt intake  4. HTN: BP is well controlled - Continue metoprolol succinate, diltiazem, and lasix  5. Aortic stenosis: moderate on echo 05/14/19 - Continue annual surveillance echo's  6. Ascending aortic aneurysm: recent echo 05/14/19 showed 4.8cm aneurysm, up from 4.3 09/2018 - Favor checking CT in 6 months.    Current medicines are reviewed at length with the patient today.  The patient does not have concerns regarding medicines.  The following changes have been made:  As above  Labs/ tests ordered today include:  No orders of the defined types were placed in this encounter.    Disposition:   FU 6 months  Signed, Suni Jarnagin Martinique, MD  07/19/2019 9:21 AM

## 2019-07-18 ENCOUNTER — Ambulatory Visit (INDEPENDENT_AMBULATORY_CARE_PROVIDER_SITE_OTHER): Payer: Medicare Other | Admitting: Ophthalmology

## 2019-07-18 ENCOUNTER — Encounter (INDEPENDENT_AMBULATORY_CARE_PROVIDER_SITE_OTHER): Payer: Self-pay | Admitting: Ophthalmology

## 2019-07-18 ENCOUNTER — Other Ambulatory Visit: Payer: Self-pay

## 2019-07-18 DIAGNOSIS — H35351 Cystoid macular degeneration, right eye: Secondary | ICD-10-CM

## 2019-07-18 DIAGNOSIS — H35371 Puckering of macula, right eye: Secondary | ICD-10-CM

## 2019-07-18 DIAGNOSIS — E1142 Type 2 diabetes mellitus with diabetic polyneuropathy: Secondary | ICD-10-CM

## 2019-07-18 DIAGNOSIS — E119 Type 2 diabetes mellitus without complications: Secondary | ICD-10-CM

## 2019-07-18 DIAGNOSIS — H348312 Tributary (branch) retinal vein occlusion, right eye, stable: Secondary | ICD-10-CM | POA: Diagnosis not present

## 2019-07-18 DIAGNOSIS — H35041 Retinal micro-aneurysms, unspecified, right eye: Secondary | ICD-10-CM | POA: Diagnosis not present

## 2019-07-18 DIAGNOSIS — Z794 Long term (current) use of insulin: Secondary | ICD-10-CM | POA: Diagnosis not present

## 2019-07-18 NOTE — Assessment & Plan Note (Signed)
The nature of macular pucker (epiretinal membrane ERM) was discussed with the patient as well as threshold criteria for vitrectomy surgery. I explained that in rare cases another surgery is needed to actually remove a second wrinkle should it regrow.  Most often, the epiretinal membrane and underlying wrinkled internal limiting membrane are removed with the first surgery, to accomplish the goals.   If the operative eye is Phakic (natural lens still present), cataract surgery is often recommended prior to Vitrectomy. This will enable the retina surgeon to have the best view during surgery and the patient to obtain optimal results in the future. Treatment options were discussed. OD MINOR, OBSERVE

## 2019-07-18 NOTE — Progress Notes (Signed)
07/18/2019     CHIEF COMPLAINT Patient presents for Retina Follow Up   HISTORY OF PRESENT ILLNESS: [name] is a 86 y.o. male who presents to the clinic today for:   HPI    Retina Follow Up    Patient presents with  Other.  In both eyes.  Severity is moderate.  Duration of 9 months.  Since onset it is stable.  I, the attending physician,  performed the HPI with the patient and updated documentation appropriately.          Comments    9 Month f\u OU. OCT  Pt states vision is stable. Denies any complaints. BGL: 101 A1C: 7.8        Last edited by Tilda Franco on 07/18/2019  8:59 AM. (History)      Referring physician: [name], [address] [place],  [place] [id number]  HISTORICAL INFORMATION:   Selected notes from the MEDICAL RECORD NUMBER    Lab Results  Component Value Date   HGBA1C 8.6 (A) 05/22/2019     CURRENT MEDICATIONS: No current outpatient medications on file. (Ophthalmic Drugs)   No current facility-administered medications for this visit. (Ophthalmic Drugs)   Current Outpatient Medications (Other)  Medication Sig  . apixaban (ELIQUIS) 5 MG TABS tablet Take 1 tablet (5 mg total) by mouth 2 (two) times daily.  [name] atorvastatin (LIPITOR) 40 MG tablet Take 1 tablet (40 mg total) by mouth daily at 6 PM.  . AVODART 0.5 MG capsule Take 0.5 mg by mouth every Monday, Wednesday, and Friday.   [name] azelastine (ASTELIN) 0.1 % nasal spray Place 1 spray into both nostrils 2 (two) times daily. Use in each nostril as directed  . cefdinir (OMNICEF) 300 MG capsule Take 1 capsule (300 mg total) by mouth 2 (two) times daily.  [name] diltiazem (CARDIZEM CD) 360 MG 24 hr capsule Take 1 capsule (360 mg total) by mouth daily.  . empagliflozin (JARDIANCE) 10 MG TABS tablet Take 5 mg by mouth daily.   . furosemide (LASIX) 40 MG tablet Take 40 mg by mouth daily.   [name] gabapentin (NEURONTIN) 300 MG capsule Take 1 capsule (300 mg total) by mouth 3 (three) times  daily.  . insulin glargine (LANTUS) 100 UNIT/ML injection Inject 38 Units into the skin every morning.   . metFORMIN (GLUCOPHAGE) 500 MG tablet Take 500 mg by mouth 2 (two) times daily with a meal.  . metoprolol succinate (TOPROL-XL) 100 MG 24 hr tablet TAKE 1 TABLET BY MOUTH TWICE DAILY (Patient taking differently: Take 100 mg by mouth 2 (two) times daily. )  . nitroGLYCERIN (NITROSTAT) 0.4 MG SL tablet Place 0.4 mg under the tongue every 5 (five) minutes as needed for chest pain (x 3 doses).  . pantoprazole (PROTONIX) 40 MG tablet TAKE 1 TABLET BY MOUTH DAILY AT NOON  . potassium chloride SA (KLOR-CON) 20 MEQ tablet Take 20 mEq by mouth daily.  [name] PRECISION XTRA TEST STRIPS test strip USE TO TEST BLOOD SUGAR 1 TO 2 TIMES DAILY  . Tamsulosin HCl (FLOMAX) 0.4 MG CAPS Take 0.4 mg by mouth every Tuesday, Thursday, Saturday, and Sunday.    No current facility-administered medications for this visit. (Other)      REVIEW OF SYSTEMS: ROS    Positive for: Endocrine   Last edited by Tilda Franco on 07/18/2019  8:59 AM. (History)       ALLERGIES Allergies  Allergen Reactions  . Erythromycin Base   .  Cefadroxil Other (See Comments)    Unknown Unknown  . Ciprofloxacin Other (See Comments)    Unknown Unknown  . Erythromycin Other (See Comments)    Upset stomach Upset stomach    PAST MEDICAL HISTORY Past Medical History:  Diagnosis Date  . Abdominal pain   . Acute renal failure ([place])     resolved  . Arthritis    "hands & legs" (11/'10/2014)  . Ataxia   . Atrial fibrillation ([place])   . Bladder outlet obstruction   . Bladder outlet obstruction   . BPH (benign prostatic hyperplasia)   . CAD (coronary artery disease)    a. CABG IN 1989. b. 01/08/2015 CTO of ost LAD, LIMA to LAD not visualized but assumed patent given myoview finding, occluded SVG to diagonal, 99% mid RCA tx w/ SYNERGY DES 3X28 mm  . Constipation   . Diabetes mellitus type 2, insulin dependent ([place])   .  Epistaxis, recurrent Feb 2017  . GERD (gastroesophageal reflux disease)   . HTN (hypertension)   . Hx of bacterial pneumonia   . Hyperlipemia   . Kidney stones   . Mild aortic stenosis   . Pneumonia 04/2019  . Rib fractures    left rib fractures being treated with pain medications  . Stented coronary artery Nov 2016   RCA DES  . Urinary tract infection     Enterococcus   Past Surgical History:  Procedure Laterality Date  . CARDIAC CATHETERIZATION  1989  . CARDIAC CATHETERIZATION N/A 01/08/2015   Procedure: Left Heart Cath and Cors/Grafts Angiography;  Surgeon: Peter M Martinique, MD;  Location: [facility];  Service: Cardiovascular;  Laterality: N/A;  . CARDIAC CATHETERIZATION  01/08/2015   Procedure: Coronary Stent Intervention;  Surgeon: Peter M Martinique, MD;  Location: [facility];  Service: Cardiovascular;;  . CARPAL TUNNEL RELEASE Right 08/2010  . CATARACT EXTRACTION W/ INTRAOCULAR LENS  IMPLANT, BILATERAL Bilateral   . CORONARY ANGIOPLASTY  01/08/15   RCA DES  . CORONARY ARTERY BYPASS GRAFT  1989   "CABG X 2"  . IR THORACENTESIS ASP PLEURAL SPACE W/IMG GUIDE  05/14/2019  . JOINT REPLACEMENT    . TONSILLECTOMY    . TOTAL HIP ARTHROPLASTY Right 2000    FAMILY HISTORY Family History  Problem Relation Age of Onset  . Brain cancer Father   . Throat cancer Brother     SOCIAL HISTORY Social History   Tobacco Use  . Smoking status: Former Smoker    Packs/day: 3.00    Years: 10.00    Pack years: 30.00    Types: Cigarettes    Quit date: 05/11/1958    Years since quitting: 61.2  . Smokeless tobacco: Never Used  Substance Use Topics  . Alcohol use: No  . Drug use: No         OPHTHALMIC EXAM:  Base Eye Exam    Visual Acuity (Snellen - Linear)      Right Left   Dist cc 20/20 -1 20/25 -1       Tonometry (Tonopen, 9:03 AM)      Right Left   Pressure 14 11       Pupils      Pupils Dark Light Shape React APD   Right PERRL 3 3 Round Minimal None    Left PERRL 3 3 Round Minimal None       Visual Fields (Counting fingers)      Left Right    Full  Restrictions  Partial outer inferior nasal deficiency       Neuro/Psych    Mood/Affect: Normal       Dilation    Both eyes: 1.0% Mydriacyl, 2.5% Phenylephrine @ 9:03 AM        Slit Lamp and Fundus Exam    External Exam      Right Left   External Normal Normal       Slit Lamp Exam      Right Left   Lids/Lashes Normal Normal   Conjunctiva/Sclera White and quiet White and quiet   Cornea Clear Clear   Anterior Chamber Deep and quiet Deep and quiet   Iris Round and reactive Round and reactive   Lens Posterior chamber intraocular lens Posterior chamber intraocular lens   Anterior Vitreous Normal Normal       Fundus Exam      Right Left   Posterior Vitreous Normal Normal   Disc Normal Normal   C/D Ratio 0.65 0.6   Macula Normal Normal   Vessels Old retinal vein occlusion, compensated Normal   Periphery Normal Normal          IMAGING AND PROCEDURES  Imaging and Procedures for 07/18/19  OCT, Retina - OU - Both Eyes       Right Eye Quality was good. Scan locations included subfoveal. Central Foveal Thickness: 297. Findings include epiretinal membrane.   Left Eye Quality was good. Scan locations included subfoveal. Central Foveal Thickness: 278. Findings include normal observations.   Notes OD, stable, no active maculopathy.  Observe                ASSESSMENT/PLAN:  Macular pucker, right eye The nature of macular pucker (epiretinal membrane ERM) was discussed with the patient as well as threshold criteria for vitrectomy surgery. I explained that in rare cases another surgery is needed to actually remove a second wrinkle should it regrow.  Most often, the epiretinal membrane and underlying wrinkled internal limiting membrane are removed with the first surgery, to accomplish the goals.   If the operative eye is Phakic (natural lens still present), cataract  surgery is often recommended prior to Vitrectomy. This will enable the retina surgeon to have the best view during surgery and the patient to obtain optimal results in the future. Treatment options were discussed. OD MINOR, OBSERVE      ICD-10-CM   1. Diabetic peripheral neuropathy associated with type 2 diabetes mellitus (HCC)  E11.42   2. Tributary retinal vein occlusion of right eye  H34.8312 OCT, Retina - OU - Both Eyes  3. Cystoid macular edema of right eye  H35.351 OCT, Retina - OU - Both Eyes  4. Retinal microaneurysm of right eye  H35.041   5. Diabetes mellitus type 2, insulin dependent (HCC)  E11.9    Z79.4   6. Macular pucker, right eye  H35.371     1.  History of retinal vein occlusion right eye, stable compensated.  2.  The retinal membrane right eye minor and only noted on OCT.  3.  Ophthalmic Meds Ordered this visit:  No orders of the defined types were placed in this encounter.      Return in about 9 months (around 04/19/2020) for DILATE OU, OCT.  There are no Patient Instructions on file for this visit.   Explained the diagnoses, plan, and follow up with the patient and they expressed understanding.  Patient expressed understanding of the importance of proper follow up care.   Clent Demark  Tahjanae Blankenburg M.D. Diseases & Surgery of the Retina and Vitreous [facility] [place] 07/18/19     Abbreviations: M myopia (nearsighted); A astigmatism; H hyperopia (farsighted); P presbyopia; Mrx spectacle prescription;  CTL contact lenses; OD right eye; OS left eye; OU both eyes  XT exotropia; ET esotropia; PEK punctate epithelial keratitis; PEE punctate epithelial erosions; DES dry eye syndrome; MGD meibomian gland dysfunction; ATs artificial tears; PFAT's preservative free artificial tears; [place] nuclear sclerotic cataract; PSC posterior subcapsular cataract; ERM epi-retinal membrane; PVD posterior vitreous detachment; RD retinal detachment; DM diabetes mellitus; DR diabetic  retinopathy; NPDR non-proliferative diabetic retinopathy; PDR proliferative diabetic retinopathy; CSME clinically significant macular edema; DME diabetic macular edema; dbh dot blot hemorrhages; CWS cotton wool spot; POAG primary open angle glaucoma; C/D cup-to-disc ratio; HVF humphrey visual field; GVF goldmann visual field; OCT optical coherence tomography; IOP intraocular pressure; BRVO Branch retinal vein occlusion; CRVO central retinal vein occlusion; CRAO central retinal artery occlusion; BRAO branch retinal artery occlusion; RT retinal tear; SB scleral buckle; PPV pars plana vitrectomy; VH Vitreous hemorrhage; PRP panretinal laser photocoagulation; IVK intravitreal kenalog; VMT vitreomacular traction; MH Macular hole;  NVD neovascularization of the disc; NVE neovascularization elsewhere; AREDS age related eye disease study; ARMD age related macular degeneration; POAG primary open angle glaucoma; EBMD epithelial/anterior basement membrane dystrophy; ACIOL anterior chamber intraocular lens; IOL intraocular lens; PCIOL posterior chamber intraocular lens; Phaco/IOL phacoemulsification with intraocular lens placement; [place] photorefractive keratectomy; LASIK laser assisted in situ keratomileusis; HTN hypertension; DM diabetes mellitus; COPD chronic obstructive pulmonary disease

## 2019-07-19 ENCOUNTER — Ambulatory Visit (INDEPENDENT_AMBULATORY_CARE_PROVIDER_SITE_OTHER): Payer: Medicare Other | Admitting: Cardiology

## 2019-07-19 ENCOUNTER — Encounter: Payer: Self-pay | Admitting: Cardiology

## 2019-07-19 VITALS — BP 124/72 | HR 72 | Ht 66.0 in | Wt 200.2 lb

## 2019-07-19 DIAGNOSIS — I5032 Chronic diastolic (congestive) heart failure: Secondary | ICD-10-CM | POA: Diagnosis not present

## 2019-07-19 DIAGNOSIS — I482 Chronic atrial fibrillation, unspecified: Secondary | ICD-10-CM | POA: Diagnosis not present

## 2019-07-19 DIAGNOSIS — I2510 Atherosclerotic heart disease of native coronary artery without angina pectoris: Secondary | ICD-10-CM | POA: Diagnosis not present

## 2019-07-19 DIAGNOSIS — I350 Nonrheumatic aortic (valve) stenosis: Secondary | ICD-10-CM

## 2019-07-19 MED ORDER — FUROSEMIDE 40 MG PO TABS: 40.0000 mg | ORAL_TABLET | Freq: Every day | ORAL | 3 refills | Status: DC

## 2019-07-19 MED ORDER — POTASSIUM CHLORIDE CRYS ER 20 MEQ PO TBCR: 20.0000 meq | EXTENDED_RELEASE_TABLET | Freq: Every day | ORAL | 3 refills | Status: DC

## 2019-08-13 ENCOUNTER — Ambulatory Visit (INDEPENDENT_AMBULATORY_CARE_PROVIDER_SITE_OTHER): Payer: Medicare Other | Admitting: Family Medicine

## 2019-08-13 ENCOUNTER — Other Ambulatory Visit: Payer: Self-pay

## 2019-08-13 ENCOUNTER — Encounter: Payer: Self-pay | Admitting: Family Medicine

## 2019-08-13 VITALS — BP 110/60 | HR 90 | Temp 98.1°F | Ht 66.0 in | Wt 202.4 lb

## 2019-08-13 DIAGNOSIS — E119 Type 2 diabetes mellitus without complications: Secondary | ICD-10-CM | POA: Diagnosis not present

## 2019-08-13 DIAGNOSIS — E782 Mixed hyperlipidemia: Secondary | ICD-10-CM | POA: Diagnosis not present

## 2019-08-13 DIAGNOSIS — I482 Chronic atrial fibrillation, unspecified: Secondary | ICD-10-CM

## 2019-08-13 DIAGNOSIS — Z7901 Long term (current) use of anticoagulants: Secondary | ICD-10-CM

## 2019-08-13 DIAGNOSIS — I10 Essential (primary) hypertension: Secondary | ICD-10-CM

## 2019-08-13 DIAGNOSIS — Z794 Long term (current) use of insulin: Secondary | ICD-10-CM | POA: Diagnosis not present

## 2019-08-13 DIAGNOSIS — R600 Localized edema: Secondary | ICD-10-CM

## 2019-08-13 DIAGNOSIS — I5032 Chronic diastolic (congestive) heart failure: Secondary | ICD-10-CM

## 2019-08-13 DIAGNOSIS — I350 Nonrheumatic aortic (valve) stenosis: Secondary | ICD-10-CM

## 2019-08-13 LAB — CBC WITH DIFFERENTIAL/PLATELET
Basophils Absolute: 0.2 10*3/uL — ABNORMAL HIGH (ref 0.0–0.1)
Basophils Relative: 2.8 % (ref 0.0–3.0)
Eosinophils Absolute: 0.5 10*3/uL (ref 0.0–0.7)
Eosinophils Relative: 5.5 % — ABNORMAL HIGH (ref 0.0–5.0)
HCT: 36.5 % — ABNORMAL LOW (ref 39.0–52.0)
Hemoglobin: 11.7 g/dL — ABNORMAL LOW (ref 13.0–17.0)
Lymphocytes Relative: 23.1 % (ref 12.0–46.0)
Lymphs Abs: 1.9 10*3/uL (ref 0.7–4.0)
MCHC: 32.1 g/dL (ref 30.0–36.0)
MCV: 78.7 fl (ref 78.0–100.0)
Monocytes Absolute: 0.6 10*3/uL (ref 0.1–1.0)
Monocytes Relative: 7.7 % (ref 3.0–12.0)
Neutro Abs: 5 10*3/uL (ref 1.4–7.7)
Neutrophils Relative %: 60.9 % (ref 43.0–77.0)
Platelets: 283 10*3/uL (ref 150.0–400.0)
RBC: 4.63 Mil/uL (ref 4.22–5.81)
RDW: 16.5 % — ABNORMAL HIGH (ref 11.5–15.5)
WBC: 8.2 10*3/uL (ref 4.0–10.5)

## 2019-08-13 LAB — COMPREHENSIVE METABOLIC PANEL
ALT: 8 U/L (ref 0–53)
AST: 12 U/L (ref 0–37)
Albumin: 4.2 g/dL (ref 3.5–5.2)
Alkaline Phosphatase: 70 U/L (ref 39–117)
BUN: 23 mg/dL (ref 6–23)
CO2: 31 mEq/L (ref 19–32)
Calcium: 9.4 mg/dL (ref 8.4–10.5)
Chloride: 103 mEq/L (ref 96–112)
Creatinine, Ser: 1.15 mg/dL (ref 0.40–1.50)
GFR: 59.85 mL/min — ABNORMAL LOW (ref >60.00)
Glucose, Bld: 189 mg/dL — ABNORMAL HIGH (ref 70–99)
Potassium: 4.8 mEq/L (ref 3.5–5.1)
Sodium: 141 mEq/L (ref 135–145)
Total Bilirubin: 0.7 mg/dL (ref 0.2–1.2)
Total Protein: 7 g/dL (ref 6.0–8.3)

## 2019-08-13 LAB — TSH: TSH: 2.22 u[IU]/mL (ref 0.35–4.50)

## 2019-08-13 LAB — LIPID PANEL
Cholesterol: 147 mg/dL (ref 0–200)
HDL: 49.7 mg/dL (ref >39.00)
LDL Cholesterol: 79 mg/dL (ref 0–99)
NonHDL: 97.44
Total CHOL/HDL Ratio: 3
Triglycerides: 93 mg/dL (ref 0.0–149.0)
VLDL: 18.6 mg/dL (ref 0.0–40.0)

## 2019-08-13 LAB — POCT GLYCOSYLATED HEMOGLOBIN (HGB A1C): Hemoglobin A1C: 8 % — AB (ref 4.0–5.6)

## 2019-08-13 NOTE — Patient Instructions (Signed)
Please return in 3 months for diabetes follow up  Please call the [facility] and ask to consider increasing your jardiance to 10mg  daily.   If you have any questions or concerns, please don't hesitate to send me a message via MyChart or call the office at [phone]. Thank you for visiting with [place] today! It's our pleasure caring for you.

## 2019-08-13 NOTE — Progress Notes (Signed)
Subjective  CC:  Chief Complaint  Patient presents with  . Diabetes    HPI: [name] is a 86 y.o. male who presents to the office today for follow up of diabetes and problems listed above in the chief complaint.   Diabetes follow up: His diabetic control is reported as Unchanged. fastings are consistently great at < 120. Feels well.  He denies exertional CP or SOB or symptomatic hypoglycemia. He denies foot sores or paresthesias.   HTN, afib, chronic chf and AS: reviewed cards notes and studies. Stable. No cp. Reviewed meds  HLD on statin. Due for labs.   On blood thinner w/o complications or bleeding  Wt Readings from Last 3 Encounters:  08/13/19 202 lb 6.4 oz (91.8 kg)  07/19/19 200 lb 3.2 oz (90.8 kg)  05/30/19 196 lb 12.8 oz (89.3 kg)    BP Readings from Last 3 Encounters:  08/13/19 110/60  07/19/19 124/72  05/30/19 128/82    Assessment  1. Diabetes mellitus type 2, insulin dependent ([place])   2. Essential (primary) hypertension   3. Mixed hyperlipidemia   4. Chronic atrial fibrillation (HCC)   5. Aortic stenosis, moderate   6. Current use of long term anticoagulation   7. Chronic diastolic CHF (congestive heart failure) ([place])   8. Lower extremity edema      Plan   Diabetes is currently adequately controlled. Rec increasing farxiga to 10mg  daily. [facility] Check labs  HLD check lipids  CAD/CHF/paf all stable.   No change in medications.   Follow up: 3 months for DM recheck. Orders Placed This Encounter  Procedures  . CBC with Differential/Platelet  . Comprehensive metabolic panel  . Lipid panel  . TSH  . POCT glycosylated hemoglobin (Hb A1C)   No orders of the defined types were placed in this encounter.     Immunization History  Administered Date(s) Administered  . Fluad Quad(high Dose 65+) 11/02/2018  . Influenza Split 11/14/2007, 01/01/2009, 12/23/2009  . Influenza, High Dose Seasonal PF  11/02/2012, 12/12/2013, 11/15/2014, 11/24/2017  . Influenza, Seasonal, Injecte, Preservative Fre 11/18/2015  . Influenza,trivalent, recombinat, inj, PF 12/07/2010  . Influenza-Unspecified 10/28/2011, 11/30/2014, 02/15/2017  . PFIZER SARS-COV-2 Vaccination 03/23/2019, 04/14/2019  . Pneumococcal Conjugate-13 12/13/2013  . Pneumococcal-Unspecified 08/20/2008  . Td 05/29/2002  . Tdap 10/28/2011, 02/15/2017  . Zoster 08/20/2008  . Zoster Recombinat (Shingrix) 10/13/2016, 12/13/2016    Diabetes Related Lab Review: Lab Results  Component Value Date   HGBA1C 8.0 (A) 08/13/2019   HGBA1C 8.6 (A) 05/22/2019   HGBA1C 8.5 (A) 02/06/2019    Lab Results  Component Value Date   MICROALBUR <0.7 03/14/2019   Lab Results  Component Value Date   CREATININE 1.02 05/22/2019   BUN 11 05/22/2019   NA 142 05/22/2019   K 4.5 05/22/2019   CL 102 05/22/2019   CO2 32 05/22/2019   Lab Results  Component Value Date   CHOL 143 10/04/2018   CHOL 146 12/07/2017   CHOL 159 07/20/2017   Lab Results  Component Value Date   HDL 44.60 10/04/2018   HDL 44.70 12/07/2017   HDL 42 07/20/2017   Lab Results  Component Value Date   LDLCALC 77 10/04/2018   LDLCALC 85 12/07/2017   LDLCALC 89 07/20/2017   Lab Results  Component Value Date   TRIG 106.0 10/04/2018   TRIG 78.0 12/07/2017   TRIG 139 07/20/2017   Lab Results  Component Value Date  CHOLHDL 3 10/04/2018   CHOLHDL 3 12/07/2017   CHOLHDL 4.0 06/07/2016   No results found for: LDLDIRECT The ASCVD Risk score Mikey Bussing DC Jr., et al., 2013) failed to calculate for the following reasons:   The 2013 ASCVD risk score is only valid for ages 12 to 50   The patient has a prior MI or stroke diagnosis I have reviewed the PMH, Fam and Soc history. Patient Active Problem List   Diagnosis Date Noted  . Aortic stenosis, moderate 05/22/2019    Priority: High  . Chronic diastolic CHF (congestive heart failure) ([place]) 03/14/2019    Priority: High  .  Spinal stenosis of lumbar region 02/21/2018    Priority: High  . Diabetic peripheral neuropathy associated with type 2 diabetes mellitus ([place]) 05/05/2017    Priority: High    No pain   . Atherosclerotic heart disease of native coronary artery without angina pectoris 06/13/2016    Priority: High    Overview:  Overview:  RCA DES placed Nov 2016, LIMA-LAD presumed patent based on Myoview but no visualized, SVG-Dx occluded  Last Assessment & Plan:  RCA DES placed Nov 2016, LIMA-LAD presumed patent based on Myoview but no visualized, SVG-Dx occluded   . CVA (cerebral vascular accident) ([place]) 06/06/2016    Priority: High  . Recurrent coronary arteriosclerosis after percutaneous transluminal coronary angioplasty 04/18/2015    Priority: High    Overview:  Overview:  RCA DES placed Nov 2016, LIMA-LAD presumed patent based on Myoview but no visualized, SVG-Dx occluded  Last Assessment & Plan:  RCA DES placed Nov 2016, LIMA-LAD presumed patent based on Myoview but no visualized, SVG-Dx occluded   . CAD S/P PCI- Nov 2016     Priority: High    RCA DES placed Nov 2016, LIMA-LAD presumed patent based on Myoview but no visualized, SVG-Dx occluded   . Current use of long term anticoagulation 02/06/2013    Priority: High    Overview:  Monitored by cardiology   . Chronic atrial fibrillation ([place]) 07/30/2011    Priority: High    CHADs VASc=5 for age, HTN, vascular disease, and DM   . Hx of CABG x 2 1990     Priority: High    Status post CABG x2 in 1990 including an LIMA graft to the LAD, and a vein graft to the diagonal.    . Essential (primary) hypertension     Priority: High  . Diabetes mellitus type 2, insulin dependent (HCC)     Priority: High    Neg urine microalbuminuria, not on ace.  On farxiga   . Mixed hyperlipidemia     Priority: High  . Degeneration of lumbar intervertebral disc 02/21/2018    Priority: Medium  . Chronic vertigo 03/15/2014    Priority: Medium  . Obesity  (BMI 30.0-34.9) 01/31/2012    Priority: Medium  . Enlarged prostate without lower urinary tract symptoms (luts) 01/04/2011    Priority: Medium    Overview:  Urology - avodart and tamuloscin   . Gastro-esophageal reflux disease without esophagitis 12/07/2010    Priority: Medium  . Epistaxis, recurrent 04/11/2015    Priority: Low  . Osteoarthritis, knee 01/31/2012    Priority: Low  . Allergic rhinitis 10/28/2011    Priority: Low  . Hearing loss 06/08/2011    Priority: Low  . Primary osteoarthritis of hand 06/08/2011    Priority: Low  . Hematuria 08/27/2008    Priority: Low    Overview:  Essential Hematuria - urology - [facility]  w/u benign  10/1 IMO update Overview:  Overview:  Essential Hematuria - urology - [place]  w/u benign   . 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 Cystoid macular edema of right eye 07/18/2019  . Retinal microaneurysm of right eye 07/18/2019  . Macular pucker, right eye 07/18/2019  . Lower extremity edema 03/14/2019    Social History: Patient  reports that he quit smoking about 61 years ago. His smoking use included cigarettes. He has a 30.00 pack-year smoking history. He has never used smokeless tobacco. He reports that he does not drink alcohol and does not use drugs.  Review of Systems: Ophthalmic: negative for eye pain, loss of vision or double vision Cardiovascular: negative for chest pain Respiratory: negative for SOB or persistent cough Gastrointestinal: negative for abdominal pain Genitourinary: negative for dysuria or gross hematuria MSK: negative for foot lesions Neurologic: negative for weakness or gait disturbance  Objective  Vitals: BP 110/60   Pulse 90   Temp 98.1 F (36.7 C) (Temporal)   Ht 5\' 6"  (1.676 m)   Wt 202 lb 6.4 oz (91.8 kg)   SpO2 99%   BMI 32.67 kg/m  General: well appearing, no acute distress  Psych:  Alert and oriented, normal mood and affect HEENT:  Normocephalic, atraumatic, moist  mucous membranes, supple neck  Cardiovascular: irreg irreg Neurologic:   Mental status is normal. normal gait Foot exam: no erythema, pallor, or cyanosis visible nl proprioception and sensation to monofilament testing bilaterally, +2 distal pulses bilaterally    Diabetic education: ongoing education regarding chronic disease management for diabetes was given today. We continue to reinforce the ABC's of diabetic management: A1c (<7 or 8 dependent upon patient), tight blood pressure control, and cholesterol management with goal LDL < 100 minimally. We discuss diet strategies, exercise recommendations, medication options and possible side effects. At each visit, we review recommended immunizations and preventive care recommendations for diabetics and stress that good diabetic control can prevent other problems. See below for this patient's data.    Commons side effects, risks, benefits, and alternatives for medications and treatment plan prescribed today were discussed, and the patient expressed understanding of the given instructions. Patient is instructed to call or message via MyChart if he/she has any questions or concerns regarding our treatment plan. No barriers to understanding were identified. We discussed Red Flag symptoms and signs in detail. Patient expressed understanding regarding what to do in case of urgent or emergency type symptoms.   Medication list was reconciled, printed and provided to the patient in AVS. Patient instructions and summary information was reviewed with the patient as documented in the AVS. This note was prepared with assistance of Dragon voice recognition software. Occasional wrong-word or sound-a-like substitutions may have occurred due to the inherent limitations of voice recognition software  This visit occurred during the SARS-CoV-2 public health emergency.  Safety protocols were in place, including screening questions prior to the visit, additional usage of staff  PPE, and extensive cleaning of exam room while observing appropriate contact time as indicated for disinfecting solutions.

## 2019-08-14 NOTE — Progress Notes (Signed)
Please call patient: I have reviewed his/her lab results. Everything looks stable. Recommend increasing jardiance dose to 10mg  daily as we discussed if Ok[facility] by [employer]. No other changes are needed. Looks good.

## 2019-08-20 ENCOUNTER — Telehealth: Payer: Self-pay | Admitting: Family Medicine

## 2019-08-20 NOTE — Telephone Encounter (Signed)
[facility]ardiance to be sent as a whole pill instead of half.

## 2019-08-21 NOTE — Telephone Encounter (Signed)
I believe he is just informing me, and we can adjust dose on med list: change farxiga to 10mg  daily.  [facility] believe [facility].  IF I need to order it, please do. Thanks

## 2019-08-21 NOTE — Telephone Encounter (Signed)
[facility]. Med list UTD

## 2019-09-14 ENCOUNTER — Other Ambulatory Visit: Payer: Self-pay

## 2019-09-14 ENCOUNTER — Ambulatory Visit (INDEPENDENT_AMBULATORY_CARE_PROVIDER_SITE_OTHER): Payer: Medicare Other | Admitting: Podiatry

## 2019-09-14 ENCOUNTER — Encounter: Payer: Self-pay | Admitting: Podiatry

## 2019-09-14 DIAGNOSIS — M79674 Pain in right toe(s): Secondary | ICD-10-CM

## 2019-09-14 DIAGNOSIS — E119 Type 2 diabetes mellitus without complications: Secondary | ICD-10-CM

## 2019-09-14 DIAGNOSIS — D689 Coagulation defect, unspecified: Secondary | ICD-10-CM | POA: Diagnosis not present

## 2019-09-14 DIAGNOSIS — B351 Tinea unguium: Secondary | ICD-10-CM

## 2019-09-14 NOTE — Progress Notes (Signed)
This patient returns to my office for at risk foot care.  This patient requires this care by a professional since this patient will be at risk due to having diabetic neuropathy and coagulation defect.  Patient is taking eliquiss.  This patient is unable to cut nails himself since the patient cannot reach his nails.These nails are painful walking and wearing shoes.  This patient presents for at risk foot care today.  General Appearance  Alert, conversant and in no acute stress.  Vascular  Dorsalis pedis and posterior tibial  pulses are palpable  bilaterally.  Capillary return is within normal limits  bilaterally. Temperature is within normal limits  bilaterally.  Neurologic  Senn-Weinstein monofilament wire test within normal limits  bilaterally. Muscle power within normal limits bilaterally.  Nails Thick disfigured discolored nails with subungual debris  from hallux to fifth toes bilaterally. No evidence of bacterial infection or drainage bilaterally.  Orthopedic  No limitations of motion  feet .  No crepitus or effusions noted.  No bony pathology or digital deformities noted.  Skin  normotropic skin with no porokeratosis noted bilaterally.  No signs of infections or ulcers noted.     Onychomycosis  Pain in right toes  Pain in left toes  Consent was obtained for treatment procedures.   Mechanical debridement of nails 1-5  bilaterally performed with a nail nipper.  Filed with dremel without incident.    Return office visit    3 months                 Told patient to return for periodic foot care and evaluation due to potential at risk complications.   Gardiner Barefoot DPM

## 2019-09-24 DIAGNOSIS — R3914 Feeling of incomplete bladder emptying: Secondary | ICD-10-CM | POA: Diagnosis not present

## 2019-11-21 LAB — TSH: TSH: 2.06 (ref 0.41–5.90)

## 2019-11-21 LAB — HEMOGLOBIN A1C: Hemoglobin A1C: 8.4

## 2019-11-21 LAB — VITAMIN D 25 HYDROXY (VIT D DEFICIENCY, FRACTURES): Vit D, 25-Hydroxy: 39.69

## 2019-11-21 LAB — BASIC METABOLIC PANEL
CO2: 31 — AB (ref 13–22)
Chloride: 106 (ref 99–108)
Creatinine: 1.2 (ref 0.6–1.3)
Glucose: 130
Potassium: 4.6 (ref 3.4–5.3)
Sodium: 139 (ref 137–147)

## 2019-11-21 LAB — COMPREHENSIVE METABOLIC PANEL: Calcium: 9.5 (ref 8.7–10.7)

## 2019-11-28 ENCOUNTER — Ambulatory Visit: Payer: Medicare Other | Admitting: Family Medicine

## 2019-11-30 ENCOUNTER — Other Ambulatory Visit: Payer: Self-pay

## 2019-11-30 ENCOUNTER — Encounter: Payer: Self-pay | Admitting: Family Medicine

## 2019-11-30 ENCOUNTER — Ambulatory Visit (INDEPENDENT_AMBULATORY_CARE_PROVIDER_SITE_OTHER): Payer: Medicare Other | Admitting: Family Medicine

## 2019-11-30 VITALS — BP 104/60 | HR 75

## 2019-11-30 DIAGNOSIS — I10 Essential (primary) hypertension: Secondary | ICD-10-CM

## 2019-11-30 DIAGNOSIS — E119 Type 2 diabetes mellitus without complications: Secondary | ICD-10-CM | POA: Diagnosis not present

## 2019-11-30 DIAGNOSIS — Z23 Encounter for immunization: Secondary | ICD-10-CM

## 2019-11-30 DIAGNOSIS — Z794 Long term (current) use of insulin: Secondary | ICD-10-CM | POA: Diagnosis not present

## 2019-11-30 DIAGNOSIS — I2510 Atherosclerotic heart disease of native coronary artery without angina pectoris: Secondary | ICD-10-CM

## 2019-11-30 DIAGNOSIS — Z9861 Coronary angioplasty status: Secondary | ICD-10-CM | POA: Diagnosis not present

## 2019-11-30 NOTE — Patient Instructions (Signed)
Please return in 3 months for recheck and blood work.   If you have any questions or concerns, please don't hesitate to send me a message via MyChart or call the office at [phone]. Thank you for visiting with [place] today! It's our pleasure caring for you.

## 2019-11-30 NOTE — Progress Notes (Signed)
Subjective  CC:  Chief Complaint  Patient presents with   Immunizations    wanting to discuss COVID booster   Diabetes   Hypertension    HPI: [name] is a 86 y.o. male who presents to the office today for follow up of diabetes and problems listed above in the chief complaint.   Diabetes follow up: His diabetic control is reported as Worse.  He has recent blood work from 922 from the [place].  A1c was up to 8.4 at that time.  He is on Jardiance 10 mg daily and Metformin twice a day.  His fasting sugars range between 100-1 20.  He has a hard time taking 2-hour postprandials due to his schedule. He denies exertional CP or SOB or symptomatic hypoglycemia. He denies foot sores.  Hypertension: Well-controlled without symptoms of hypotension.  No chest pain.  Immunizations: Eligible for Covid booster.  Due flu shot today. Wt Readings from Last 3 Encounters:  08/13/19 202 lb 6.4 oz (91.8 kg)  07/19/19 200 lb 3.2 oz (90.8 kg)  05/30/19 196 lb 12.8 oz (89.3 kg)    BP Readings from Last 3 Encounters:  11/30/19 104/60  08/13/19 110/60  07/19/19 124/72    Assessment  1. Diabetes mellitus type 2, insulin dependent ([place])   2. Essential (primary) hypertension   3. CAD S/P PCI- Nov 2016   4. Need for immunization against influenza      Plan   Diabetes is currently adequately controlled.  Still would like to see his A1c 8.0 or less.  I recommend increasing Jardiance to 25 mg daily.  He will follow-up with his [place] doctor as she is managing this medication for him.  Flu shot updated today.  Discussed indications for Covid booster.  Patient to get at the pharmacy.  Blood pressures well controlled.  Monitor for hypertension.  He does have aortic stenosis.  Follow up: 3 months for recheck with lab work. Orders Placed This Encounter  Procedures   Flu Vaccine QUAD High Dose(Fluad)   No orders of the defined types were placed in this encounter.     Immunization History   Administered Date(s) Administered   Fluad Quad(high Dose 65+) 11/02/2018, 11/30/2019   Influenza Split 11/14/2007, 01/01/2009, 12/23/2009   Influenza, High Dose Seasonal PF 11/02/2012, 12/12/2013, 11/15/2014, 11/24/2017   Influenza, Seasonal, Injecte, Preservative Fre 11/18/2015   Influenza,trivalent, recombinat, inj, PF 12/07/2010   Influenza-Unspecified 10/28/2011, 11/30/2014, 02/15/2017   PFIZER SARS-COV-2 Vaccination 03/23/2019, 04/14/2019   Pneumococcal Conjugate-13 12/13/2013   Pneumococcal-Unspecified 08/20/2008   Td 05/29/2002   Tdap 10/28/2011, 02/15/2017   Zoster 08/20/2008   Zoster Recombinat (Shingrix) 10/13/2016, 12/13/2016    Diabetes Related Lab Review: Lab Results  Component Value Date   HGBA1C 8.0 (A) 08/13/2019   HGBA1C 8.6 (A) 05/22/2019   HGBA1C 8.5 (A) 02/06/2019    Lab Results  Component Value Date   MICROALBUR <0.7 03/14/2019   Lab Results  Component Value Date   CREATININE 1.15 08/13/2019   BUN 23 08/13/2019   NA 141 08/13/2019   K 4.8 08/13/2019   CL 103 08/13/2019   CO2 31 08/13/2019   Lab Results  Component Value Date   CHOL 147 08/13/2019   CHOL 143 10/04/2018   CHOL 146 12/07/2017   Lab Results  Component Value Date   HDL 49.70 08/13/2019   HDL 44.60 10/04/2018   HDL 44.70 12/07/2017   Lab Results  Component Value Date   LDLCALC 79 08/13/2019   LDLCALC 77  10/04/2018   [place] 85 12/07/2017   Lab Results  Component Value Date   TRIG 93.0 08/13/2019   TRIG 106.0 10/04/2018   TRIG 78.0 12/07/2017   Lab Results  Component Value Date   CHOLHDL 3 08/13/2019   CHOLHDL 3 10/04/2018   CHOLHDL 3 12/07/2017   No results found for: LDLDIRECT The ASCVD Risk score Mikey Bussing DC Jr., et al., 2013) failed to calculate for the following reasons:   The 2013 ASCVD risk score is only valid for ages 58 to 31   The patient has a prior MI or stroke diagnosis I have reviewed the PMH, Fam and Soc history. Patient Active Problem List    Diagnosis Date Noted   Aortic stenosis, moderate 05/22/2019    Priority: High   Chronic diastolic CHF (congestive heart failure) ([place]) 03/14/2019    Priority: High   Spinal stenosis of lumbar region 02/21/2018    Priority: High   Diabetic peripheral neuropathy associated with type 2 diabetes mellitus ([place]) 05/05/2017    Priority: High    No pain    Atherosclerotic heart disease of native coronary artery without angina pectoris 06/13/2016    Priority: High    Overview:  Overview:  RCA DES placed Nov 2016, LIMA-LAD presumed patent based on Myoview but no visualized, SVG-Dx occluded  Last Assessment & Plan:  RCA DES placed Nov 2016, LIMA-LAD presumed patent based on Myoview but no visualized, SVG-Dx occluded    CVA (cerebral vascular accident) ([place]) 06/06/2016    Priority: High   Recurrent coronary arteriosclerosis after percutaneous transluminal coronary angioplasty 04/18/2015    Priority: High    Overview:  Overview:  RCA DES placed Nov 2016, LIMA-LAD presumed patent based on Myoview but no visualized, SVG-Dx occluded  Last Assessment & Plan:  RCA DES placed Nov 2016, LIMA-LAD presumed patent based on Myoview but no visualized, SVG-Dx occluded    CAD S/P PCI- Nov 2016     Priority: High    RCA DES placed Nov 2016, LIMA-LAD presumed patent based on Myoview but no visualized, SVG-Dx occluded    Current use of long term anticoagulation 02/06/2013    Priority: High    Overview:  Monitored by cardiology    Chronic atrial fibrillation ([place]) 07/30/2011    Priority: High    CHADs VASc=5 for age, HTN, vascular disease, and DM    Hx of CABG x 2 1990     Priority: High    Status post CABG x2 in 1990 including an LIMA graft to the LAD, and a vein graft to the diagonal.     Essential (primary) hypertension     Priority: High   Diabetes mellitus type 2, insulin dependent ([place])     Priority: High    Neg urine microalbuminuria, not on ace.  On farxiga    Mixed  hyperlipidemia     Priority: High   Degeneration of lumbar intervertebral disc 02/21/2018    Priority: Medium   Chronic vertigo 03/15/2014    Priority: Medium   Obesity (BMI 30.0-34.9) 01/31/2012    Priority: Medium   Enlarged prostate without lower urinary tract symptoms (luts) 01/04/2011    Priority: Medium    Overview:  Urology - avodart and tamuloscin    Gastro-esophageal reflux disease without esophagitis 12/07/2010    Priority: Medium   Epistaxis, recurrent 04/11/2015    Priority: Low   Osteoarthritis, knee 01/31/2012    Priority: Low   Allergic rhinitis 10/28/2011    Priority: Low   Hearing  loss 06/08/2011    Priority: Low   Primary osteoarthritis of hand 06/08/2011    Priority: Low   Hematuria 08/27/2008    Priority: Low    Overview:  Essential Hematuria - urology - [place]  w/u benign  10/1 IMO update Overview:  Overview:  Essential Hematuria - urology - [place]  w/u benign    Tributary retinal vein occlusion of right eye 07/18/2019   Cystoid macular edema of right eye 07/18/2019   Retinal microaneurysm of right eye 07/18/2019   Macular pucker, right eye 07/18/2019   Lower extremity edema 03/14/2019    Social History: Patient  reports that he quit smoking about 61 years ago. His smoking use included cigarettes. He has a 30.00 pack-year smoking history. He has never used smokeless tobacco. He reports that he does not drink alcohol and does not use drugs.  Review of Systems: Ophthalmic: negative for eye pain, loss of vision or double vision Cardiovascular: negative for chest pain Respiratory: negative for SOB or persistent cough Gastrointestinal: negative for abdominal pain Genitourinary: negative for dysuria or gross hematuria MSK: negative for foot lesions Neurologic: negative for weakness or gait disturbance  Objective  Vitals: BP 104/60    Pulse 75    SpO2 97%  General: well appearing, no acute distress  Psych:  Alert and oriented,  normal mood and affect HEENT:  Normocephalic, atraumatic, moist mucous membranes, supple neck  Cardiovascular: Distant heart sounds, soft murmur present  respiratory:  Good breath sounds bilaterally, CTAB with normal effort, no rales  Diabetic education: ongoing education regarding chronic disease management for diabetes was given today. We continue to reinforce the ABC's of diabetic management: A1c (<7 or 8 dependent upon patient), tight blood pressure control, and cholesterol management with goal LDL < 100 minimally. We discuss diet strategies, exercise recommendations, medication options and possible side effects. At each visit, we review recommended immunizations and preventive care recommendations for diabetics and stress that good diabetic control can prevent other problems. See below for this patient's data.    Commons side effects, risks, benefits, and alternatives for medications and treatment plan prescribed today were discussed, and the patient expressed understanding of the given instructions. Patient is instructed to call or message via MyChart if he/she has any questions or concerns regarding our treatment plan. No barriers to understanding were identified. We discussed Red Flag symptoms and signs in detail. Patient expressed understanding regarding what to do in case of urgent or emergency type symptoms.   Medication list was reconciled, printed and provided to the patient in AVS. Patient instructions and summary information was reviewed with the patient as documented in the AVS. This note was prepared with assistance of Dragon voice recognition software. Occasional wrong-word or sound-a-like substitutions may have occurred due to the inherent limitations of voice recognition software  This visit occurred during the SARS-CoV-2 public health emergency.  Safety protocols were in place, including screening questions prior to the visit, additional usage of staff PPE, and extensive cleaning  of exam room while observing appropriate contact time as indicated for disinfecting solutions.

## 2019-12-04 ENCOUNTER — Encounter: Payer: Self-pay | Admitting: Family Medicine

## 2019-12-06 DIAGNOSIS — Z23 Encounter for immunization: Secondary | ICD-10-CM | POA: Diagnosis not present

## 2019-12-07 ENCOUNTER — Other Ambulatory Visit: Payer: Self-pay | Admitting: Family Medicine

## 2019-12-21 ENCOUNTER — Encounter: Payer: Self-pay | Admitting: Podiatry

## 2019-12-21 ENCOUNTER — Other Ambulatory Visit: Payer: Self-pay

## 2019-12-21 ENCOUNTER — Ambulatory Visit (INDEPENDENT_AMBULATORY_CARE_PROVIDER_SITE_OTHER): Payer: Medicare Other | Admitting: Podiatry

## 2019-12-21 DIAGNOSIS — D689 Coagulation defect, unspecified: Secondary | ICD-10-CM

## 2019-12-21 DIAGNOSIS — E119 Type 2 diabetes mellitus without complications: Secondary | ICD-10-CM

## 2019-12-21 DIAGNOSIS — M79674 Pain in right toe(s): Secondary | ICD-10-CM

## 2019-12-21 DIAGNOSIS — B351 Tinea unguium: Secondary | ICD-10-CM | POA: Diagnosis not present

## 2019-12-21 NOTE — Progress Notes (Signed)
This patient returns to my office for at risk foot care.  This patient requires this care by a professional since this patient will be at risk due to having diabetic neuropathy and coagulation defect.  Patient is taking eliquiss.  This patient is unable to cut nails himself since the patient cannot reach his nails.These nails are painful walking and wearing shoes.  This patient presents for at risk foot care today.  General Appearance  Alert, conversant and in no acute stress.  Vascular  Dorsalis pedis and posterior tibial  pulses are palpable  bilaterally.  Capillary return is within normal limits  bilaterally. Temperature is within normal limits  bilaterally.  Neurologic  Senn-Weinstein monofilament wire test within normal limits  bilaterally. Muscle power within normal limits bilaterally.  Nails Thick disfigured discolored nails with subungual debris  from hallux to fifth toes bilaterally. No evidence of bacterial infection or drainage bilaterally.  Orthopedic  No limitations of motion  feet .  No crepitus or effusions noted.  No bony pathology or digital deformities noted.  Skin  normotropic skin with no porokeratosis noted bilaterally.  No signs of infections or ulcers noted.     Onychomycosis  Pain in right toes  Pain in left toes  Consent was obtained for treatment procedures.   Mechanical debridement of nails 1-5  bilaterally performed with a nail nipper.  Filed with dremel without incident.    Return office visit    10 weeks                Told patient to return for periodic foot care and evaluation due to potential at risk complications.   Gardiner Barefoot DPM

## 2019-12-27 ENCOUNTER — Telehealth: Payer: Self-pay | Admitting: Family Medicine

## 2019-12-27 NOTE — Chronic Care Management (AMB) (Signed)
  Chronic Care Management   Note  12/27/2019 Name: [name] MRN: [id number] DOB: 03-16-1928  [name] is a 86 y.o. year old male who is a primary care patient of [name], MD. I reached out to [name] by phone today in response to a referral sent by [name] PCP, [name], MD.   [name] was given information about Chronic Care Management services today including:  1. CCM service includes personalized support from designated clinical staff supervised by his physician, including individualized plan of care and coordination with other care providers 2. 24/7 contact phone numbers for assistance for urgent and routine care needs. 3. Service will only be billed when office clinical staff spend 20 minutes or more in a month to coordinate care. 4. Only one practitioner may furnish and bill the service in a calendar month. 5. The patient may stop CCM services at any time (effective at the end of the month) by phone call to the office staff.   Patient agreed to services and verbal consent obtained.   Follow up plan:   Lauretta Grill Upstream Scheduler

## 2019-12-28 ENCOUNTER — Ambulatory Visit: Payer: Medicare Other | Admitting: Podiatry

## 2020-01-14 NOTE — Progress Notes (Signed)
Cardiology Office Note   Date:  01/17/2020   ID:  [name], DOB November 01, 1928, MRN [id number]  PCP:  [name], MD  Cardiologist:  [name], MD EP: None  Chief Complaint  Patient presents with  . Atrial Fibrillation  . Coronary Artery Disease      History of Present Illness: [name] is a 86 y.o. male with PMH of CAD s/p CABG in 1990 with subsequent DES to RCA in 2006, permanent atrial fibrillation, chronic diastolic CHF, aortic stenosis, HTN, HLD, carotid artery disease, DM type 2, CVA, who presents for  follow-up.   He was recently admitted to the hospital 05/13/19-05/18/19 for acute respiratory failure 2/2 PNA and pleural effusion, managed with IV antibiotics and a thoracentesis. Cardiology followed that admission for assistance with atrial fibrillation with RVR and he was discharged home on diltiazem 360mg  and metoprolol succinate 100mg  BID for rate control, apixaban 5mg  BID for stroke ppx, and lasix 40mg  daily for CHF. Echo 05/14/19 showed EF 60-65%, moderate concentric LVH, indeterminate LV diastolic function, mild AI, moderate AS, and ascending aortic aneurysm of 4.8cm (up from 4.3cm 09/2018).   On follow up today he is doing well.  Denies any dyspnea, palpitations, dizziness or chest pain.  No bleeding. No swelling now. No back pain.    Past Medical History:  Diagnosis Date  . Abdominal pain   . Acute renal failure ([place])     resolved  . Arthritis    "hands & legs" (11/'10/2014)  . Ataxia   . Atrial fibrillation ([place])   . Bladder outlet obstruction   . Bladder outlet obstruction   . BPH (benign prostatic hyperplasia)   . CAD (coronary artery disease)    a. CABG IN 1989. b. 01/08/2015 CTO of ost LAD, LIMA to LAD not visualized but assumed patent given myoview finding, occluded SVG to diagonal, 99% mid RCA tx w/ SYNERGY DES 3X28 mm  . Constipation   . Diabetes mellitus type 2, insulin dependent ([place])   . Epistaxis, recurrent Feb 2017  . GERD  (gastroesophageal reflux disease)   . HTN (hypertension)   . Hx of bacterial pneumonia   . Hyperlipemia   . Kidney stones   . Mild aortic stenosis   . Pneumonia 04/2019  . Rib fractures    left rib fractures being treated with pain medications  . Stented coronary artery Nov 2016   RCA DES  . Urinary tract infection     Enterococcus    Past Surgical History:  Procedure Laterality Date  . CARDIAC CATHETERIZATION  1989  . CARDIAC CATHETERIZATION N/A 01/08/2015   Procedure: Left Heart Cath and Cors/Grafts Angiography;  Surgeon: [name], MD;  Location: [facility];  Service: Cardiovascular;  Laterality: N/A;  . CARDIAC CATHETERIZATION  01/08/2015   Procedure: Coronary Stent Intervention;  Surgeon: [name], MD;  Location: [facility];  Service: Cardiovascular;;  . CARPAL TUNNEL RELEASE Right 08/2010  . CATARACT EXTRACTION W/ INTRAOCULAR LENS  IMPLANT, BILATERAL Bilateral   . CORONARY ANGIOPLASTY  01/08/15   RCA DES  . CORONARY ARTERY BYPASS GRAFT  1989   "CABG X 2"  . IR THORACENTESIS ASP PLEURAL SPACE W/IMG GUIDE  05/14/2019  . JOINT REPLACEMENT    . TONSILLECTOMY    . TOTAL HIP ARTHROPLASTY Right 2000     Current Outpatient Medications  Medication Sig Dispense Refill  . apixaban (ELIQUIS) 5 MG TABS tablet Take 1 tablet (5 mg total) by mouth  2 (two) times daily. 60 tablet 2  . atorvastatin (LIPITOR) 40 MG tablet Take 1 tablet (40 mg total) by mouth daily at 6 PM. 30 tablet 2  . AVODART 0.5 MG capsule Take 0.5 mg by mouth every Monday, Wednesday, and Friday.     [name] azelastine (ASTELIN) 0.1 % nasal spray Place 1 spray into both nostrils 2 (two) times daily. Use in each nostril as directed 30 mL 12  . cefdinir (OMNICEF) 300 MG capsule Take 1 capsule (300 mg total) by mouth 2 (two) times daily. 8 capsule 0  . diltiazem (CARDIZEM CD) 360 MG 24 hr capsule Take 1 capsule (360 mg total) by mouth daily. 30 capsule 6  . empagliflozin (JARDIANCE) 10 MG TABS tablet  Take 10 mg by mouth daily.    . furosemide (LASIX) 40 MG tablet Take 1 tablet (40 mg total) by mouth daily. 90 tablet 3  . gabapentin (NEURONTIN) 300 MG capsule TAKE 1 CAPSULE(300 MG) BY MOUTH THREE TIMES DAILY 270 capsule 3  . insulin glargine (LANTUS) 100 UNIT/ML injection Inject 38 Units into the skin every morning.     . metFORMIN (GLUCOPHAGE) 500 MG tablet Take 500 mg by mouth 2 (two) times daily with a meal.    . metoprolol succinate (TOPROL-XL) 100 MG 24 hr tablet TAKE 1 TABLET BY MOUTH TWICE DAILY (Patient taking differently: Take 100 mg by mouth 2 (two) times daily. ) 180 tablet 3  . nitroGLYCERIN (NITROSTAT) 0.4 MG SL tablet Place 0.4 mg under the tongue every 5 (five) minutes as needed for chest pain (x 3 doses).    . pantoprazole (PROTONIX) 40 MG tablet TAKE 1 TABLET BY MOUTH DAILY AT NOON 90 tablet 3  . potassium chloride SA (KLOR-CON) 20 MEQ tablet Take 1 tablet (20 mEq total) by mouth daily. 90 tablet 3  . PRECISION XTRA TEST STRIPS test strip USE TO TEST BLOOD SUGAR 1 TO 2 TIMES DAILY 200 strip 6  . Tamsulosin HCl (FLOMAX) 0.4 MG CAPS Take 0.4 mg by mouth every Tuesday, Thursday, Saturday, and Sunday.      No current facility-administered medications for this visit.    Allergies:   Erythromycin base, Cefadroxil, Ciprofloxacin, and Erythromycin    Social History:  The patient  reports that he quit smoking about 61 years ago. His smoking use included cigarettes. He has a 30.00 pack-year smoking history. He has never used smokeless tobacco. He reports that he does not drink alcohol and does not use drugs.   Family History:  The patient's family history includes Brain cancer in his father; Throat cancer in his brother.    ROS:  Please see the history of present illness.   Otherwise, review of systems are positive for none.   All other systems are reviewed and negative.    PHYSICAL EXAM: VS:  BP 132/80   Pulse 89   Ht 5\' 6"  (1.676 m)   Wt 210 lb 12.8 oz (95.6 kg)   SpO2 93%    BMI 34.02 kg/m  , BMI Body mass index is 34.02 kg/m. GEN: Well nourished, well developed, in no acute distress HEENT: normal Neck: no JVD, carotid bruits, or masses Cardiac: IRIR; gr 2/6 systolic murmur RUSB, rubs, or gallops, trace LE edema  Respiratory:  clear to auscultation bilaterally, normal work of breathing GI: soft, nontender, nondistended, + BS MS: no deformity or atrophy Skin: warm and dry, no rash Neuro:  Strength and sensation are intact Psych: euthymic mood, full affect   EKG:  EKG is not ordered today.   Recent Labs: 05/14/2019: B Natriuretic Peptide 272.9 05/15/2019: Magnesium 2.0 08/13/2019: ALT 8; BUN 23; Hemoglobin 11.7; Platelets 283.0 11/21/2019: Creatinine 1.2; Potassium 4.6; Sodium 139; TSH 2.06    Lipid Panel    Component Value Date/Time   CHOL 147 08/13/2019 1154   TRIG 93.0 08/13/2019 1154   HDL 49.70 08/13/2019 1154   CHOLHDL 3 08/13/2019 1154   VLDL 18.6 08/13/2019 1154   LDLCALC 79 08/13/2019 1154      Wt Readings from Last 3 Encounters:  01/17/20 210 lb 12.8 oz (95.6 kg)  08/13/19 202 lb 6.4 oz (91.8 kg)  07/19/19 200 lb 3.2 oz (90.8 kg)      Other studies Reviewed: Additional studies/ records that were reviewed today include:   2D echo 05/14/2019 IMPRESSIONS   1. Left ventricular ejection fraction, by estimation, is 60 to 65%. The  left ventricle has normal function. The left ventricle has no regional  wall motion abnormalities. There is moderate concentric left ventricular  hypertrophy. Left ventricular  diastolic parameters are indeterminate.  2. Right ventricular systolic function is normal. The right ventricular  size is normal.  3. The mitral valve is normal in structure. No evidence of mitral valve  regurgitation. No evidence of mitral stenosis.  4. The aortic valve is normal in structure. Aortic valve regurgitation is  mild. Moderate aortic valve stenosis. Aortic valve area, by VTI measures  1.27 cm. Aortic valve  mean gradient measures 19.3 mmHg. Aortic valve Vmax  measures 2.75 m/s.  5. Compared with th echo 09/2018, ascending aorta has increased from 4.3  cm to 4.8cm. . Aortic dilatation noted. There is moderate dilatation of  the ascending aorta measuring 48 mm.  6. The inferior vena cava is normal in size with greater than 50%  respiratory variability, suggesting right atrial pressure of 3 mmHg.   Left heart catheterization 2016:  Prox RCA lesion, 40% stenosed.  LM lesion, 20% stenosed.  Ost LAD to Prox LAD lesion, 100% stenosed.  SVG .  Origin lesion, 100% stenosed.  Mid RCA-2 lesion, 70% stenosed.  Mid RCA-1 lesion, 99% stenosed. Post intervention, there is a 0% residual stenosis.   1. Severe 2 vessel obstructive CAD. CTO of the origin of the LAD. Critical mid RCA stenosis. 2. Occluded SVG to the diagonal 3. LIMA to the LAD was not visualized but assumed patent based on clinical history and Myoview findings. 4. Normal LV EDP. 5. Successful stenting of the Mid RCA with DES. Very difficult procedure due to vessel tortuosity.  Plan: DAPT with ASA and Plavix for one month then stop ASA and continue Plavix for at least one year. Resume Coumadin tomorrow. Will assess LV function with an Echo. Stop prilosec and start protonix. Anticipate DC in am if stable. Patient noted to be bradycardic throughout case so will reduce metoprolol to 50 mg daily.  ASSESSMENT AND PLAN:  1. CAD s/p CABG in 1990 with DES to RCA in 2006: no anginal complaints. Not on aspirin given need for anticoagulation - Continue statin - Continue metoprolol  2. Permanent atrial fibrillation: HR is well controlled  - No complaints of bleeding - Continue metoprolol succinate and diltiazem for rate control - Continue eliquis for stroke ppx. (does not meet requirements for reduced dose)  3. Chronic diastolic CHF: no volume overload complaints and he appears euvolemic on exam - Continue lasix 40mg  daily and potassium -  Continue metoprolol succinate - Continue to limit salt intake  4. HTN:  BP is well controlled - Continue metoprolol succinate, diltiazem, and lasix  5. Aortic stenosis: moderate on echo 05/14/19 - Continue annual surveillance echo's - repeat March 2022  6. Ascending aortic aneurysm: last echo 05/14/19 showed 4.8cm aneurysm, up from 4.3 09/2018 - will check CTA chest to assess further.   Current medicines are reviewed at length with the patient today.  The patient does not have concerns regarding medicines.  The following changes have been made:  As above  Labs/ tests ordered today include:   Orders Placed This Encounter  Procedures  . CT Chest Wo Contrast  . Basic metabolic panel     Disposition:   FU 6 months  Signed, Montell Leopard Martinique, MD  01/17/2020 11:01 AM

## 2020-01-15 ENCOUNTER — Ambulatory Visit: Payer: Medicare Other

## 2020-01-17 ENCOUNTER — Other Ambulatory Visit: Payer: Self-pay

## 2020-01-17 ENCOUNTER — Encounter: Payer: Self-pay | Admitting: Cardiology

## 2020-01-17 ENCOUNTER — Ambulatory Visit (INDEPENDENT_AMBULATORY_CARE_PROVIDER_SITE_OTHER): Payer: Medicare Other | Admitting: Cardiology

## 2020-01-17 VITALS — BP 132/80 | HR 89 | Ht 66.0 in | Wt 210.8 lb

## 2020-01-17 DIAGNOSIS — I10 Essential (primary) hypertension: Secondary | ICD-10-CM | POA: Diagnosis not present

## 2020-01-17 DIAGNOSIS — I482 Chronic atrial fibrillation, unspecified: Secondary | ICD-10-CM | POA: Diagnosis not present

## 2020-01-17 DIAGNOSIS — Z9861 Coronary angioplasty status: Secondary | ICD-10-CM | POA: Diagnosis not present

## 2020-01-17 DIAGNOSIS — I2510 Atherosclerotic heart disease of native coronary artery without angina pectoris: Secondary | ICD-10-CM

## 2020-01-17 DIAGNOSIS — I7121 Aneurysm of the ascending aorta, without rupture: Secondary | ICD-10-CM

## 2020-01-17 DIAGNOSIS — I712 Thoracic aortic aneurysm, without rupture, unspecified: Secondary | ICD-10-CM

## 2020-01-17 LAB — BASIC METABOLIC PANEL
BUN/Creatinine Ratio: 19 (ref 10–24)
BUN: 24 mg/dL (ref 8–27)
CO2: 28 mmol/L (ref 20–29)
Calcium: 9.7 mg/dL (ref 8.6–10.2)
Chloride: 102 mmol/L (ref 96–106)
Creatinine, Ser: 1.28 mg/dL — ABNORMAL HIGH (ref 0.76–1.27)
GFR calc Af Amer: 57 mL/min/{1.73_m2} — ABNORMAL LOW (ref >59)
GFR calc non Af Amer: 49 mL/min/{1.73_m2} — ABNORMAL LOW (ref >59)
Glucose: 239 mg/dL — ABNORMAL HIGH (ref 65–99)
Potassium: 5.3 mmol/L — ABNORMAL HIGH (ref 3.5–5.2)
Sodium: 141 mmol/L (ref 134–144)

## 2020-01-17 NOTE — Patient Instructions (Signed)
Medication Instructions:  Continue same medications *If you need a refill on your cardiac medications before your next appointment, please call your pharmacy*   Lab Work: Bmet today   Testing/Procedures: Chest CT    Follow-Up: At [facility], you and your health needs are our priority.  As part of our continuing mission to provide you with exceptional heart care, we have created designated Provider Care Teams.  These Care Teams include your primary Cardiologist (physician) and Advanced Practice Providers (APPs -  Physician Assistants and Nurse Practitioners) who all work together to provide you with the care you need, when you need it.  We recommend signing up for the patient portal called "MyChart".  Sign up information is provided on this After Visit Summary.  MyChart is used to connect with patients for Virtual Visits (Telemedicine).  Patients are able to view lab/test results, encounter notes, upcoming appointments, etc.  Non-urgent messages can be sent to your provider as well.   To learn more about what you can do with MyChart, go to [email or web address].    Your next appointment:  6 months   Call in Feb to schedule May appointment    The format for your next appointment: Office    Provider:  Dr.Jordan

## 2020-01-28 LAB — HEPATIC FUNCTION PANEL
ALT: 19 (ref 10–40)
AST: 12 — AB (ref 14–40)
Alkaline Phosphatase: 68 (ref 25–125)
Bilirubin, Direct: 0.2 (ref 0.01–0.4)
Bilirubin, Total: 0.7

## 2020-01-28 LAB — COMPREHENSIVE METABOLIC PANEL: Albumin: 3.6 (ref 3.5–5.0)

## 2020-01-30 ENCOUNTER — Ambulatory Visit: Payer: Medicare Other

## 2020-01-30 ENCOUNTER — Other Ambulatory Visit: Payer: Self-pay

## 2020-01-30 DIAGNOSIS — E782 Mixed hyperlipidemia: Secondary | ICD-10-CM

## 2020-01-30 DIAGNOSIS — E119 Type 2 diabetes mellitus without complications: Secondary | ICD-10-CM

## 2020-01-30 DIAGNOSIS — I10 Essential (primary) hypertension: Secondary | ICD-10-CM

## 2020-01-30 NOTE — Patient Instructions (Addendum)
Please review care plan below and call me at [phone] with any questions!  Thank you, Edyth Gunnels., Clinical Pharmacist  Goals Addressed            This Visit's Progress   . PharmD Care Plan       CARE PLAN ENTRY (see longitudinal plan of care for additional care plan information)  Current Barriers:  . Chronic Disease Management support, education, and care coordination needs related to Hypertension, Hyperlipidemia, and Diabetes   Hypertension BP Readings from Last 3 Encounters:  01/17/20 132/80  11/30/19 104/60  08/13/19 110/60   . Pharmacist Clinical Goal(s): o Over the next 365 days, patient will work with PharmD and providers to maintain BP goal <140/90 . Current regimen:  o Metoprolol succinate 100 mg twice twice (blood pressure, heart rate control in atrial fibrillation) o Diltiazem CR 360 mg once daily(blood pressure, heart rate control in atrial fibrillation) . Interventions: o We discussed diet and exercise extensively. Maintain a healthy weight and exercise regularly, as directed by your health care provider. Eat healthy foods, such as: Lean proteins, complex carbohydrates, fresh fruits and vegetables, low-fat dairy products, healthy fats. o Reviewed home BP monitoring . Patient self care activities - Over the next 365 days, patient will: o Check BP at least once every 1-2 weeks, document, and provide at future appointments o Ensure daily salt intake < 2300 mg/day  Hyperlipidemia Lab Results  Component Value Date/Time   LDLCALC 79 08/13/2019 11:54 AM   . Pharmacist Clinical Goal(s): o Over the next 365 days, patient will work with PharmD and providers to maintain LDL goal < 100 . Current regimen:  o Atorvastatin 40 mg once daily . Interventions: o Diet/exercise per hypertension . Patient self care activities - Over the next 365 days, patient will: o Continue current management  Diabetes Lab Results  Component Value Date/Time   HGBA1C 8.4 11/21/2019 12:00  AM   HGBA1C 8.0 (A) 08/13/2019 11:59 AM   HGBA1C 8.6 (A) 05/22/2019 10:24 AM   HGBA1C 9.0 02/09/2018 12:00 AM   . Pharmacist Clinical Goal(s): o Over the next 180 days, patient will work with PharmD and providers to achieve A1c goal <8% . Current regimen:  o Jardiance 10 mg once daily o Metformin 500 mg twice daily o Insulin glargine 38 units once every morning  . Interventions: o Reviewed blood sugar correction - rule of 15 - handout provided . Patient self care activities - Over the next 365 days, patient will: o Check blood sugar once daily, document, and provide at future appointments o Contact provider with any episodes of hypoglycemia  Medication management . Pharmacist Clinical Goal(s): o Over the next 365 days, patient will work with PharmD and providers to maintain optimal medication adherence . [facility] pharmacy[facility] . Interventions o Comprehensive medication review performed. o Continue current medication management strategy . Patient self care activities - Over the next 365 days, patient will: o Take medications as prescribed o Report any questions or concerns to PharmD and/or provider(s) Initial goal documentation.      The patient verbalized understanding of instructions provided today and agreed to receive a mailed copy of patient instruction and/or educational materials. Telephone follow up appointment with pharmacy team member scheduled for: See next appointment with "Care Management Staff" under "What's Next" below.   Madelin Rear, Pharm.D., BCGP Clinical Pharmacist [facility] [phone]  Hypoglycemia Hypoglycemia is when the sugar (glucose) level in your blood is too low. Signs of low  blood sugar may include:  Feeling: ? Hungry. ? Worried or nervous (anxious). ? Sweaty and clammy. ? Confused. ? Dizzy. ? Sleepy. ? Sick to your stomach (nauseous).  Having: ? A fast heartbeat. ? A headache. ? A change in your vision. ? Tingling  or no feeling (numbness) around your mouth, lips, or tongue. ? Jerky movements that you cannot control (seizure).  Having trouble with: ? Moving (coordination). ? Sleeping. ? Passing out (fainting). ? Getting upset easily (irritability). Low blood sugar can happen to people who have diabetes and people who do not have diabetes. Low blood sugar can happen quickly, and it can be an emergency. Treating low blood sugar Low blood sugar is often treated by eating or drinking something sugary right away, such as:  Fruit juice, 4-6 oz (120-150 mL).  Regular soda (not diet soda), 4-6 oz (120-150 mL).  Low-fat milk, 4 oz (120 mL).  Several pieces of hard candy.  Sugar or honey, 1 Tbsp (15 mL). Treating low blood sugar if you have diabetes If you can think clearly and swallow safely, follow the 15:15 rule:  Take 15 grams of a fast-acting carb (carbohydrate). Talk with your doctor about how much you should take.  Always keep a source of fast-acting carb with you, such as: ? Sugar tablets (glucose pills). Take 3-4 pills. ? 6-8 pieces of hard candy. ? 4-6 oz (120-150 mL) of fruit juice. ? 4-6 oz (120-150 mL) of regular (not diet) soda. ? 1 Tbsp (15 mL) honey or sugar.  Check your blood sugar 15 minutes after you take the carb.  If your blood sugar is still at or below 70 mg/dL (3.9 mmol/L), take 15 grams of a carb again.  If your blood sugar does not go above 70 mg/dL (3.9 mmol/L) after 3 tries, get help right away.  After your blood sugar goes back to normal, eat a meal or a snack within 1 hour.  Treating very low blood sugar If your blood sugar is at or below 54 mg/dL (3 mmol/L), you have very low blood sugar (severe hypoglycemia). This may also cause:  Passing out.  Jerky movements you cannot control (seizure).  Losing consciousness (coma). This is an emergency. Do not wait to see if the symptoms will go away. Get medical help right away. Call your local emergency services (911  in the [place] Do not drive yourself to the hospital. If you have very low blood sugar and you cannot eat or drink, you may need a glucagon shot (injection). A family member or friend should learn how to check your blood sugar and how to give you a glucagon shot. Ask your doctor if you need to have a glucagon shot kit at home. Follow these instructions at home: General instructions  Take over-the-counter and prescription medicines only as told by your doctor.  Stay aware of your blood sugar as told by your doctor.  Limit alcohol intake to no more than 1 drink a day for nonpregnant women and 2 drinks a day for men. One drink equals 12 oz of beer (355 mL), 5 oz of wine (148 mL), or 1 oz of hard liquor (44 mL).  Keep all follow-up visits as told by your doctor. This is important. If you have diabetes:   Follow your diabetes care plan as told by your doctor. Make sure you: ? Know the signs of low blood sugar. ? Take your medicines as told. ? Follow your exercise and meal plan. ? Eat on  time. Do not skip meals. ? Check your blood sugar as often as told by your doctor. Always check it before and after exercise. ? Follow your sick day plan when you cannot eat or drink normally. Make this plan ahead of time with your doctor.  Share your diabetes care plan with: ? Your work or school. ? People you live with.  Check your pee (urine) for ketones: ? When you are sick. ? As told by your doctor.  Carry a card or wear jewelry that says you have diabetes. Contact a doctor if:  You have trouble keeping your blood sugar in your target range.  You have low blood sugar often. Get help right away if:  You still have symptoms after you eat or drink something sugary.  Your blood sugar is at or below 54 mg/dL (3 mmol/L).  You have jerky movements that you cannot control.  You pass out. These symptoms may be an emergency. Do not wait to see if the symptoms will go away. Get medical help right  away. Call your local emergency services (911 in the [place] Do not drive yourself to the hospital. Summary  Hypoglycemia happens when the level of sugar (glucose) in your blood is too low.  Low blood sugar can happen to people who have diabetes and people who do not have diabetes. Low blood sugar can happen quickly, and it can be an emergency.  Make sure you know the signs of low blood sugar and know how to treat it.  Always keep a source of sugar (fast-acting carb) with you to treat low blood sugar. This information is not intended to replace advice given to you by your health care provider. Make sure you discuss any questions you have with your health care provider. Document Revised: 06/08/2018 Document Reviewed: 03/21/2015 Elsevier Patient Education  2020 [employer].

## 2020-01-30 NOTE — Progress Notes (Signed)
Chronic Care Management Pharmacy  Name: [name] MRN: [id number]   DOB: [birth year]  Chief Complaint/ HPI [name], 86 y.o., male, presents for their initial CCM visit with the clinical pharmacist in office .  PCP : [name], MD Encounter Diagnoses  Name Primary?  . Diabetes mellitus type 2, insulin dependent ([place]) Yes  . Mixed hyperlipidemia   . Essential (primary) hypertension     Office Visits:  11/30/2019 (PCP): DMt[facility]. Had been recommended to increase Jardiance to 25 mg once daily from current 10 mg.  Consult Visit: 01/17/2020 (Dr Martinique): continue meds, annual surveillance for AAA, 6 month f/u.  Patient Active Problem List   Diagnosis Date Noted  . 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 Cystoid macular edema of right eye 07/18/2019  . Retinal microaneurysm of right eye 07/18/2019  . Macular pucker, right eye 07/18/2019  . Aortic stenosis, moderate 05/22/2019  . Chronic diastolic CHF (congestive heart failure) ([place]) 03/14/2019  . Lower extremity edema 03/14/2019  . Spinal stenosis of lumbar region 02/21/2018  . Degeneration of lumbar intervertebral disc 02/21/2018  . Diabetic peripheral neuropathy associated with type 2 diabetes mellitus ([place]) 05/05/2017  . Atherosclerotic heart disease of native coronary artery without angina pectoris 06/13/2016  . CVA (cerebral vascular accident) ([place]) 06/06/2016  . Recurrent coronary arteriosclerosis after percutaneous transluminal coronary angioplasty 04/18/2015  . Epistaxis, recurrent 04/11/2015  . CAD S/P PCI- Nov 2016   . Chronic vertigo 03/15/2014  . Current use of long term anticoagulation 02/06/2013  . Obesity (BMI 30.0-34.9) 01/31/2012  . Osteoarthritis, knee 01/31/2012  . Allergic rhinitis 10/28/2011  . Chronic atrial fibrillation ([place]) 07/30/2011  . Hearing loss 06/08/2011  . Primary osteoarthritis of hand 06/08/2011  . Enlarged prostate without lower urinary tract  symptoms ([place]) 01/04/2011  . Gastro-esophageal reflux disease without esophagitis 12/07/2010  . Hx of CABG x 2 1990   . Essential (primary) hypertension   . Diabetes mellitus type 2, insulin dependent ([place])   . Mixed hyperlipidemia   . Hematuria 08/27/2008   Past Surgical History:  Procedure Laterality Date  . CARDIAC CATHETERIZATION  1989  . CARDIAC CATHETERIZATION N/A 01/08/2015   Procedure: Left Heart Cath and Cors/Grafts Angiography;  Surgeon: Peter M Martinique, MD;  Location: [facility];  Service: Cardiovascular;  Laterality: N/A;  . CARDIAC CATHETERIZATION  01/08/2015   Procedure: Coronary Stent Intervention;  Surgeon: Peter M Martinique, MD;  Location: [facility];  Service: Cardiovascular;;  . CARPAL TUNNEL RELEASE Right 08/2010  . CATARACT EXTRACTION W/ INTRAOCULAR LENS  IMPLANT, BILATERAL Bilateral   . CORONARY ANGIOPLASTY  01/08/15   RCA DES  . CORONARY ARTERY BYPASS GRAFT  1989   "CABG X 2"  . IR THORACENTESIS ASP PLEURAL SPACE W/IMG GUIDE  05/14/2019  . JOINT REPLACEMENT    . TONSILLECTOMY    . TOTAL HIP ARTHROPLASTY Right 2000   Family History  Problem Relation Age of Onset  . Brain cancer Father   . Throat cancer Brother    Allergies  Allergen Reactions  . Erythromycin Base   . Cefadroxil Other (See Comments)    Unknown Unknown  . Ciprofloxacin Other (See Comments)    Unknown Unknown  . Erythromycin Other (See Comments)    Upset stomach Upset stomach   Outpatient Encounter Medications as of 01/30/2020  Medication Sig  . apixaban (ELIQUIS) 5 MG TABS tablet Take 1 tablet (5 mg total) by mouth 2 (two) times daily.  [name]  atorvastatin (LIPITOR) 40 MG tablet Take 1 tablet (40 mg total) by mouth daily at 6 PM.  . AVODART 0.5 MG capsule Take 0.5 mg by mouth every Monday, Wednesday, and Friday.   . diltiazem (CARDIZEM CD) 360 MG 24 hr capsule Take 1 capsule (360 mg total) by mouth daily.  . empagliflozin (JARDIANCE) 10 MG TABS tablet Take 10 mg by mouth daily.   . furosemide (LASIX) 40 MG tablet Take 1 tablet (40 mg total) by mouth daily.  [name] gabapentin (NEURONTIN) 300 MG capsule TAKE 1 CAPSULE(300 MG) BY MOUTH THREE TIMES DAILY  . insulin glargine (LANTUS) 100 UNIT/ML injection Inject 38 Units into the skin every morning.   . metFORMIN (GLUCOPHAGE) 500 MG tablet Take 500 mg by mouth 2 (two) times daily with a meal.  . metoprolol succinate (TOPROL-XL) 100 MG 24 hr tablet TAKE 1 TABLET BY MOUTH TWICE DAILY (Patient taking differently: Take 100 mg by mouth 2 (two) times daily. )  . pantoprazole (PROTONIX) 40 MG tablet TAKE 1 TABLET BY MOUTH DAILY AT NOON  . potassium chloride SA (KLOR-CON) 20 MEQ tablet Take 1 tablet every other day  . PRECISION XTRA TEST STRIPS test strip USE TO TEST BLOOD SUGAR 1 TO 2 TIMES DAILY  . Tamsulosin HCl (FLOMAX) 0.4 MG CAPS Take 0.4 mg by mouth every Tuesday, Thursday, Saturday, and Sunday.   . nitroGLYCERIN (NITROSTAT) 0.4 MG SL tablet Place 0.4 mg under the tongue every 5 (five) minutes as needed for chest pain (x 3 doses).  . [DISCONTINUED] azelastine (ASTELIN) 0.1 % nasal spray Place 1 spray into both nostrils 2 (two) times daily. Use in each nostril as directed  . [DISCONTINUED] cefdinir (OMNICEF) 300 MG capsule Take 1 capsule (300 mg total) by mouth 2 (two) times daily.   No facility-administered encounter medications on file as of 01/30/2020.   Patient Care Team    Relationship Specialty Notifications Start End  [name], MD PCP - General Family Medicine  12/27/13   Martinique, Peter M, MD PCP - Cardiology Cardiology Admissions 10/04/18   Martinique, Peter M, MD Consulting Physician Cardiology  02/16/17   Gardiner Barefoot, DPM Consulting Physician Podiatry  02/16/17   Elam Dutch, MD Consulting Physician Vascular Surgery  02/16/17   Melida Quitter, MD Consulting Physician Otolaryngology  02/16/17   Zadie Rhine Clent Demark, MD Consulting Physician Ophthalmology  06/08/17   [place] Physician General  Practice  11/02/18   Pa, [facility]  Neurosurgery  12/21/18   Madelin Rear, [facility] Pharmacist Pharmacist  12/27/19    Comment: [phone]   Current Diagnosis/Assessment: Goals Addressed            This Visit's Progress   . PharmD Care Plan       CARE PLAN ENTRY (see longitudinal plan of care for additional care plan information)  Current Barriers:  . Chronic Disease Management support, education, and care coordination needs related to Hypertension, Hyperlipidemia, and Diabetes   Hypertension BP Readings from Last 3 Encounters:  01/17/20 132/80  11/30/19 104/60  08/13/19 110/60   . Pharmacist Clinical Goal(s): o Over the next 365 days, patient will work with PharmD and providers to maintain BP goal <140/90 . Current regimen:  o Metoprolol succinate 100 mg twice twice (blood pressure, heart rate control in atrial fibrillation) o Diltiazem CR 360 mg once daily(blood pressure, heart rate control in atrial fibrillation) . Interventions: o We discussed diet and exercise extensively. Maintain a healthy weight and exercise  regularly, as directed by your health care provider. Eat healthy foods, such as: Lean proteins, complex carbohydrates, fresh fruits and vegetables, low-fat dairy products, healthy fats. o Reviewed home BP monitoring . Patient self care activities - Over the next 365 days, patient will: o Check BP at least once every 1-2 weeks, document, and provide at future appointments o Ensure daily salt intake < 2300 mg/day  Hyperlipidemia Lab Results  Component Value Date/Time   LDLCALC 79 08/13/2019 11:54 AM   . Pharmacist Clinical Goal(s): o Over the next 365 days, patient will work with PharmD and providers to maintain LDL goal < 100 . Current regimen:  o Atorvastatin 40 mg once daily . Interventions: o Diet/exercise per hypertension . Patient self care activities - Over the next 365 days, patient will: o Continue current  management  Diabetes Lab Results  Component Value Date/Time   HGBA1C 8.4 11/21/2019 12:00 AM   HGBA1C 8.0 (A) 08/13/2019 11:59 AM   HGBA1C 8.6 (A) 05/22/2019 10:24 AM   HGBA1C 9.0 02/09/2018 12:00 AM   . Pharmacist Clinical Goal(s): o Over the next 180 days, patient will work with PharmD and providers to achieve A1c goal <8% . Current regimen:  o Jardiance 10 mg once daily o Metformin 500 mg twice daily o Insulin glargine 38 units once every morning  . Interventions: o Reviewed blood sugar correction - rule of 15 - handout provided . Patient self care activities - Over the next 365 days, patient will: o Check blood sugar once daily, document, and provide at future appointments o Contact provider with any episodes of hypoglycemia  Medication management . Pharmacist Clinical Goal(s): o Over the next 365 days, patient will work with PharmD and providers to maintain optimal medication adherence . [facility] . Interventions o Comprehensive medication review performed. o Continue current medication management strategy . Patient self care activities - Over the next 365 days, patient will: o Take medications as prescribed o Report any questions or concerns to PharmD and/or provider(s) Initial goal documentation.      Hypertension   BP goal <140/90  BP Readings from Last 3 Encounters:  01/17/20 132/80  11/30/19 104/60  08/13/19 110/60   Denies dizziness. Does not monitor at home.  Not following any specific diet, no routine exercise.  Patient is currently at goal on the following medications:  . See Afib  We discussed diet and exercise extensively. Maintain a healthy weight and exercise regularly, as directed by your health care provider. Eat healthy foods, such as: Lean proteins, complex carbohydrates, fresh fruits and vegetables, low-fat dairy products, healthy fats.  Plan  Continue current medications.  Lower extremity edema   BMP Latest Ref Rng &  Units 01/17/2020 11/21/2019 08/13/2019  Glucose 65 - 99 mg/dL 239(H) - 189(H)  BUN 8 - 27 mg/dL 24 - 23  Creatinine 0.76 - 1.27 mg/dL 1.28(H) 1.2 1.15  BUN/Creat Ratio 10 - 24 19 - -  Sodium 134 - 144 mmol/L 141 139 141  Potassium 3.5 - 5.2 mmol/L 5.3(H) 4.6 4.8  Chloride 96 - 106 mmol/L 102 106 103  CO2 20 - 29 mmol/L 28 31(A) 31  Calcium 8.6 - 10.2 mg/dL 9.7 9.5 9.4    Patient has failed these meds in past: n/a. Taking potassium 20 meq once daily. Patient is currently controlled on the following medications:  . Furosemide 40 mg once daily  Plan  Continue current medications  Diabetes   A1c goal < 8%  Lab Results  Component Value Date/Time   HGBA1C 8.4 11/21/2019 12:00 AM   HGBA1C 8.0 (A) 08/13/2019 11:59 AM   HGBA1C 8.6 (A) 05/22/2019 10:24 AM   HGBA1C 9.0 02/09/2018 12:00 AM   MICROALBUR <0.7 03/14/2019 08:37 AM   MICROALBUR 3.5 (H) 08/01/2017 09:40 AM   Lab Results  Component Value Date   CREATININE 1.28 (H) 01/17/2020   BUN 24 01/17/2020   GFR 59.85 (L) 08/13/2019   GFRNONAA 49 (L) 01/17/2020   GFRAA 57 (L) 01/17/2020   NA 141 01/17/2020   K 5.3 (H) 01/17/2020   CALCIUM 9.7 01/17/2020   CO2 28 01/17/2020    Checking BG: Daily. Recent FBG readings: once every 2 months.  90-110 FBG, 180s in evening. Denies any side effects at this time. Low BG<70 maybe a few times/yr. Patient is currently not at goal on the following medications:  . Empagliflozin 10 mg once daily . Metformin 500 mg twice daily with meal  . Insulin glargine (Lantus) 38 units injected into skin every morning  We discussed: diet and exercise extensively.  Reviewed rule of 15 for BG correction.  Reviewed patient assistance gets brand name meds through [place].  Plan  Continue current medications.  Hyperlipidemia   LDL goal < 100  Lipid Panel     Component Value Date/Time   CHOL 147 08/13/2019 1154   TRIG 93.0 08/13/2019 1154   HDL 49.70 08/13/2019 1154   LDLCALC 79 08/13/2019 1154     Hepatic Function Latest Ref Rng & Units 08/13/2019 05/22/2019 05/15/2019  Total Protein 6.0 - 8.3 g/dL 7.0 6.0 -  Albumin 3.5 - 5.2 g/dL 4.2 3.2(L) 2.2(L)  AST 0 - 37 U/L 12 22 -  ALT 0 - 53 U/L 8 23 -  Alk Phosphatase 39 - 117 U/L 70 93 -  Total Bilirubin 0.2 - 1.2 mg/dL 0.7 0.6 -  Bilirubin, Direct 0.01 - 0.4 - - -    The ASCVD Risk score ([place]., et al., 2013) failed to calculate for the following reasons:   The 2013 ASCVD risk score is only valid for ages 81 to 26   The patient has a prior MI or stroke diagnosis   Patient is currently at goal the following medications:  . Atorvastatin 40 mg once daily  (Dr Allyson Sabal)  Secondary prevention. No antiplatelet due to need for anticoagulation for afib stroke ppx.  We discussed diet and exercise extensively.   Plan  Continue current medications.  AFIB   Pulse Readings from Last 3 Encounters:  01/17/20 89  11/30/19 75  08/13/19 90   Patient is currently rate controlled on the following medications:  [name] Metoprolol succinate 100 mg twice daily  (Dr Jonni Sanger) . Diltiazem 360 mg once daily (Dr Martinique)  Prevention of stroke in Afib. CHA2DS2/VAS 8. Denies any abnormal bruising, bleeding from nose or gums or blood in urine or stool. Patient is currently controlled on the following medications:  . Eliquis 5 mg twice daily (Dr Berdine Dance)  Reviewed patient assistance - gets Eliquis $0 through [place].  Plan  Continue current medications.  Pain   Denies somnolence/drowsiness or other side effects Patient is currently controlled on the following medications:  . Gabapentin 300 mg three times daily   We discussed:  Potential medication related side effects.  Plan  Continue current medications.  Vaccines   Immunization History  Administered Date(s) Administered  . Fluad Quad(high Dose 65+) 11/02/2018, 11/30/2019  . Influenza Split 11/14/2007, 01/01/2009, 12/23/2009  . Influenza, High Dose Seasonal  PF 11/02/2012, 12/12/2013, 11/15/2014,  11/24/2017  . Influenza, Seasonal, Injecte, Preservative Fre 11/18/2015  . Influenza,trivalent, recombinat, inj, PF 12/07/2010  . Influenza-Unspecified 10/28/2011, 11/30/2014, 02/15/2017  . PFIZER SARS-COV-2 Vaccination 03/23/2019, 04/14/2019  . Pneumococcal Conjugate-13 12/13/2013  . Pneumococcal-Unspecified 08/20/2008  . Td 05/29/2002  . Tdap 10/28/2011, 02/15/2017  . Zoster 08/20/2008  . Zoster Recombinat (Shingrix) 10/13/2016, 12/13/2016   Reviewed and discussed patient's vaccination history.   Pt states he has received pfizer booster.   Plan  No recommendations at this time.   Medication Management / Care Coordination   Receives prescription medications from:  [id number] - [place], [place] - [address] [place] [address] [place] [address] [place] [address] Phone: [phone] Fax: [phone]   Gets most medications through [place].  Denies any issues with current medication management.   Plan  Continue current medication management strategy. ___________________________ SDOH (Social Determinants of Health) assessments performed: Yes.  Future Appointments  Date Time Provider [place]  02/04/2020 12:40 PM GI[facility] 1 GI[facility]315[facility]  03/05/2020 11:30 AM [name], MD [facility]  03/28/2020 10:15 AM Gardiner Barefoot, DPM [facility]  07/21/2020  9:00 AM [place], Clent Demark, MD [facility] None   Visit follow-up:  . CPA follow-up: 5 month gen/adh. Schedule rph visit for following month. . [place] follow-up: 6 month telephone visit.  Madelin Rear, Pharm.D., BCGP Clinical Pharmacist [facility] [phone]

## 2020-02-01 DIAGNOSIS — H401111 Primary open-angle glaucoma, right eye, mild stage: Secondary | ICD-10-CM | POA: Diagnosis not present

## 2020-02-01 DIAGNOSIS — H04123 Dry eye syndrome of bilateral lacrimal glands: Secondary | ICD-10-CM | POA: Diagnosis not present

## 2020-02-04 ENCOUNTER — Ambulatory Visit
Admission: RE | Admit: 2020-02-04 | Discharge: 2020-02-04 | Disposition: A | Discharge status: Home / Self Care | Payer: Medicare Other | Source: Ambulatory Visit | Attending: Cardiology | Admitting: Cardiology

## 2020-02-04 ENCOUNTER — Other Ambulatory Visit: Payer: Self-pay

## 2020-02-04 DIAGNOSIS — I2510 Atherosclerotic heart disease of native coronary artery without angina pectoris: Secondary | ICD-10-CM | POA: Diagnosis not present

## 2020-02-04 DIAGNOSIS — I712 Thoracic aortic aneurysm, without rupture, unspecified: Secondary | ICD-10-CM

## 2020-02-04 DIAGNOSIS — J984 Other disorders of lung: Secondary | ICD-10-CM | POA: Diagnosis not present

## 2020-02-04 DIAGNOSIS — K449 Diaphragmatic hernia without obstruction or gangrene: Secondary | ICD-10-CM | POA: Diagnosis not present

## 2020-02-07 ENCOUNTER — Telehealth: Payer: Self-pay | Admitting: Cardiology

## 2020-02-07 NOTE — Telephone Encounter (Signed)
Patient would like Malachy Mood to mail him a copy of his CT results. Mailing address has been verified with the patient.

## 2020-02-07 NOTE — Telephone Encounter (Signed)
Called patient left message on personal voice mail I mailed you a copy of 02/04/20 chest ct.

## 2020-02-15 ENCOUNTER — Other Ambulatory Visit: Payer: Self-pay

## 2020-02-15 ENCOUNTER — Ambulatory Visit (INDEPENDENT_AMBULATORY_CARE_PROVIDER_SITE_OTHER): Payer: Medicare Other

## 2020-02-15 VITALS — BP 120/78 | HR 80 | Temp 97.2°F | Wt 210.6 lb

## 2020-02-15 DIAGNOSIS — Z0000 Encounter for general adult medical examination without abnormal findings: Secondary | ICD-10-CM | POA: Diagnosis not present

## 2020-02-15 NOTE — Progress Notes (Signed)
Subjective:   Zadiel Leyh is a 86 y.o. male who presents for Medicare Annual/Subsequent preventive examination.  Review of Systems     Cardiac Risk Factors include: advanced age (>8men, >37 women);diabetes mellitus;dyslipidemia;hypertension;male gender;obesity (BMI >30kg/m2)     Objective:    Today's Vitals   02/15/20 1330  BP: 120/78  Pulse: 80  Temp: (!) 97.2 F (36.2 C)  SpO2: 96%  Weight: 210 lb 9.6 oz (95.5 kg)   Body mass index is 33.99 kg/m.  Advanced Directives 02/15/2020 05/14/2019 05/14/2019 05/13/2019 05/12/2019 11/02/2018 06/08/2017  Does Patient Have a Medical Advance Directive? No - Yes No Yes Yes Yes  Type of Advance Directive - - (No Data) - - Living will;Healthcare Power of Attorney Living will;Healthcare Power of Attorney  Does patient want to make changes to medical advance directive? - No - Patient declined No - Patient declined - - No - Patient declined -  Copy of [place] in Chart? - - - - - No - copy requested No - copy requested  Would patient like information on creating a medical advance directive? No - Patient declined - - - - - -    Current Medications (verified) Outpatient Encounter Medications as of 02/15/2020  Medication Sig   apixaban (ELIQUIS) 5 MG TABS tablet Take 1 tablet (5 mg total) by mouth 2 (two) times daily.   atorvastatin (LIPITOR) 40 MG tablet Take 1 tablet (40 mg total) by mouth daily at 6 PM.   AVODART 0.5 MG capsule Take 0.5 mg by mouth every Monday, Wednesday, and Friday.    diltiazem (CARDIZEM CD) 360 MG 24 hr capsule Take 1 capsule (360 mg total) by mouth daily.   empagliflozin (JARDIANCE) 10 MG TABS tablet Take 10 mg by mouth daily.   furosemide (LASIX) 40 MG tablet Take 1 tablet (40 mg total) by mouth daily.   gabapentin (NEURONTIN) 300 MG capsule TAKE 1 CAPSULE(300 MG) BY MOUTH THREE TIMES DAILY   insulin glargine (LANTUS) 100 UNIT/ML injection Inject 38 Units into the skin every morning.     metFORMIN (GLUCOPHAGE) 500 MG tablet Take 500 mg by mouth 2 (two) times daily with a meal.   metoprolol succinate (TOPROL-XL) 100 MG 24 hr tablet TAKE 1 TABLET BY MOUTH TWICE DAILY (Patient taking differently: Take 100 mg by mouth 2 (two) times daily.)   pantoprazole (PROTONIX) 40 MG tablet TAKE 1 TABLET BY MOUTH DAILY AT NOON   potassium chloride SA (KLOR-CON) 20 MEQ tablet Take 1 tablet every other day   PRECISION XTRA TEST STRIPS test strip USE TO TEST BLOOD SUGAR 1 TO 2 TIMES DAILY   tamsulosin (FLOMAX) 0.4 MG CAPS capsule Take 0.4 mg by mouth every Tuesday, Thursday, Saturday, and Sunday.   fluticasone (FLONASE) 50 MCG/ACT nasal spray Place 1 spray into both nostrils daily.   nitroGLYCERIN (NITROSTAT) 0.4 MG SL tablet Place 0.4 mg under the tongue every 5 (five) minutes as needed for chest pain (x 3 doses). (Patient not taking: Reported on 02/15/2020)   No facility-administered encounter medications on file as of 02/15/2020.    Allergies (verified) Erythromycin base, Cefadroxil, Ciprofloxacin, and Erythromycin   History: Past Medical History:  Diagnosis Date   Abdominal pain    Acute renal failure ([place])     resolved   Arthritis    "hands & legs" (11/'10/2014)   Ataxia    Atrial fibrillation ([facility])    Bladder outlet obstruction    Bladder outlet obstruction    BPH (benign  prostatic hyperplasia)    CAD (coronary artery disease)    a. CABG IN 1989. b. 01/08/2015 CTO of ost LAD, LIMA to LAD not visualized but assumed patent given myoview finding, occluded SVG to diagonal, 99% mid RCA tx w/ SYNERGY DES 3X28 mm   Constipation    Diabetes mellitus type 2, insulin dependent ([place])    Epistaxis, recurrent Feb 2017   GERD (gastroesophageal reflux disease)    HTN (hypertension)    Hx of bacterial pneumonia    Hyperlipemia    Kidney stones    Mild aortic stenosis    Pneumonia 04/2019   Rib fractures    left rib fractures being treated with pain medications    Stented coronary artery Nov 2016   RCA DES   Urinary tract infection     Enterococcus   Past Surgical History:  Procedure Laterality Date   [place] N/A 01/08/2015   Procedure: Left Heart Cath and Cors/Grafts Angiography;  Surgeon: Peter M Martinique, MD;  Location: [facility];  Service: Cardiovascular;  Laterality: N/A;   CARDIAC CATHETERIZATION  01/08/2015   Procedure: Coronary Stent Intervention;  Surgeon: Peter M Martinique, MD;  Location: [facility];  Service: Cardiovascular;;   CARPAL TUNNEL RELEASE Right 08/2010   CATARACT EXTRACTION W/ INTRAOCULAR LENS  IMPLANT, BILATERAL Bilateral    CORONARY ANGIOPLASTY  01/08/15   RCA DES   CORONARY ARTERY BYPASS GRAFT  1989   "CABG X 2"   IR THORACENTESIS ASP PLEURAL SPACE W/IMG GUIDE  05/14/2019   JOINT REPLACEMENT     TONSILLECTOMY     TOTAL HIP ARTHROPLASTY Right 2000   Family History  Problem Relation Age of Onset   Brain cancer Father    Throat cancer Brother    Social History   Socioeconomic History   Marital status: Married    Spouse name: Not on file   Number of children: 3   Years of education: Not on file   Highest education level: Not on file  Occupational History   Occupation: Agricultural consultant  Tobacco Use   Smoking status: Former Smoker    Packs/day: 3.00    Years: 10.00    Pack years: 30.00    Types: Cigarettes    Quit date: 05/11/1958    Years since quitting: 61.8   Smokeless tobacco: Never Used  Vaping Use   Vaping Use: Never used  Substance and Sexual Activity   Alcohol use: No   Drug use: No   Sexual activity: Not Currently  Other Topics Concern   Not on file  Social History Narrative   Previously lived in [place], [place] currently living in [place], [place]    Social Determinants of Health   Financial Resource Strain: Low Risk    Difficulty of Paying Living Expenses: Not hard at all  Food Insecurity: No Food  Insecurity   Worried About Charity fundraiser in the Last Year: Never true   Arboriculturist in the Last Year: Never true  Transportation Needs: No Transportation Needs   Lack of Transportation (Medical): No   Lack of Transportation (Non-Medical): No  Physical Activity: Inactive   Days of Exercise per Week: 0 days   Minutes of Exercise per Session: 0 min  Stress: No Stress Concern Present   Feeling of Stress : Not at all  Social Connections: Moderately Isolated   Frequency of Communication with Friends and Family: Twice a  week   Frequency of Social Gatherings with Friends and Family: Three times a week   Attends Religious Services: Never   Active Member of Clubs or Organizations: No   Attends Music therapist: Never   Marital Status: Married    Tobacco Counseling Counseling given: Not Answered   Clinical Intake:  Pre-visit preparation completed: Yes  Pain : No/denies pain     BMI - recorded: 33.99 Nutritional Status: BMI > 30  Obese Nutritional Risks: None Diabetes: Yes CBG done?: Yes (106) CBG resulted in Enter/ Edit results?: No Did pt. bring in CBG monitor from home?: No  How often do you need to have someone help you when you read instructions, pamphlets, or other written materials from your doctor or pharmacy?: 1 - Never  Diabetic?Nutrition Risk Assessment:  Has the patient had any N/V/D within the last 2 months?  No  Does the patient have any non-healing wounds?  No  Has the patient had any unintentional weight loss or weight gain?  No   Diabetes:  Is the patient diabetic?  Yes  If diabetic, was a CBG obtained today?  Yes  Did the patient bring in their glucometer from home?  No  How often do you monitor your CBG's?daily.   Financial Strains and Diabetes Management:  Are you having any financial strains with the device, your supplies or your medication? No .  Does the patient want to be seen by Chronic Care Management for  management of their diabetes?  No  Would the patient like to be referred to a Nutritionist or for Diabetic Management?  No   Diabetic Exams:  Diabetic Eye Exam: Completed 07/18/19 Diabetic Foot Exam: Completed 08/13/19   Interpreter Needed?: No  Information entered by :: Charlott Rakes, LPN   Activities of Daily Living In your present state of health, do you have any difficulty performing the following activities: 02/15/2020 05/14/2019  Hearing? Y N  Comment hard of hearing -  Vision? N N  Difficulty concentrating or making decisions? Y N  Comment at times -  Walking or climbing stairs? Y Y  Comment don't do stairs -  Dressing or bathing? N N  Doing errands, shopping? N Y  Conservation officer, nature and eating ? N -  Using the Toilet? N -  In the past six months, have you accidently leaked urine? N -  Do you have problems with loss of bowel control? N -  Managing your Medications? N -  Managing your Finances? N -  Housekeeping or managing your Housekeeping? N -  Some recent data might be hidden    Patient Care Team: [name], MD as PCP - General (Family Medicine) Martinique, Peter M, MD as PCP - Cardiology (Cardiology) Martinique, Peter M, MD as Consulting Physician (Cardiology) Gardiner Barefoot, DPM as Consulting Physician (Podiatry) [name] Jessy Oto, MD as Consulting Physician (Vascular Surgery) Melida Quitter, MD as Consulting Physician (Otolaryngology) Zadie Rhine Clent Demark, MD as Consulting Physician (Ophthalmology) [place] as Consulting Physician ([place]) [place], [place] (Neurosurgery) Madelin Rear, [facility] as Pharmacist (Pharmacist)  Indicate any recent Medical Services you may have received from other than [facility] providers in the past year (date may be approximate).     Assessment:   This is a routine wellness examination for [employer].  Hearing/Vision screen  Hearing Screening   125Hz  250Hz  500Hz  1000Hz  2000Hz  3000Hz  4000Hz  6000Hz   8000Hz   Right ear:  Left ear:           Comments: Pt states hard of hearing   Vision Screening Comments: Pt follows Dr Nancy Fetter and dr Zadie Rhine for eye exams  Dietary issues and exercise activities discussed: Current Exercise Habits: The patient does not participate in regular exercise at present  Goals     Patient Stated     Maintain current health by staying active.      Patient Stated     Maintain current health     Erma (see longitudinal plan of care for additional care plan information)  Current Barriers:   Chronic Disease Management support, education, and care coordination needs related to Hypertension, Hyperlipidemia, and Diabetes   Hypertension BP Readings from Last 3 Encounters:  01/17/20 132/80  11/30/19 104/60  08/13/19 110/60    Pharmacist Clinical Goal(s): o Over the next 365 days, patient will work with PharmD and providers to maintain BP goal <140/90  Current regimen:  o Metoprolol succinate 100 mg twice twice (blood pressure, heart rate control in atrial fibrillation) o Diltiazem CR 360 mg once daily(blood pressure, heart rate control in atrial fibrillation)  Interventions: o We discussed diet and exercise extensively. Maintain a healthy weight and exercise regularly, as directed by your health care provider. Eat healthy foods, such as: Lean proteins, complex carbohydrates, fresh fruits and vegetables, low-fat dairy products, healthy fats. o Reviewed home BP monitoring  Patient self care activities - Over the next 365 days, patient will: o Check BP at least once every 1-2 weeks, document, and provide at future appointments o Ensure daily salt intake < 2300 mg/day  Hyperlipidemia Lab Results  Component Value Date/Time   LDLCALC 79 08/13/2019 11:54 AM    Pharmacist Clinical Goal(s): o Over the next 365 days, patient will work with PharmD and providers to maintain LDL goal < 100  Current regimen:   o Atorvastatin 40 mg once daily  Interventions: o Diet/exercise per hypertension  Patient self care activities - Over the next 365 days, patient will: o Continue current management  Diabetes Lab Results  Component Value Date/Time   HGBA1C 8.4 11/21/2019 12:00 AM   HGBA1C 8.0 (A) 08/13/2019 11:59 AM   HGBA1C 8.6 (A) 05/22/2019 10:24 AM   HGBA1C 9.0 02/09/2018 12:00 AM    Pharmacist Clinical Goal(s): o Over the next 180 days, patient will work with PharmD and providers to achieve A1c goal <8%  Current regimen:  o Jardiance 10 mg once daily o Metformin 500 mg twice daily o Insulin glargine 38 units once every morning   Interventions: o Reviewed blood sugar correction - rule of 15 - handout provided  Patient self care activities - Over the next 365 days, patient will: o Check blood sugar once daily, document, and provide at future appointments o Contact provider with any episodes of hypoglycemia  Medication management  Pharmacist Clinical Goal(s): o Over the next 365 days, patient will work with PharmD and providers to maintain optimal medication adherence  [facility]  Interventions o Comprehensive medication review performed. o Continue current medication management strategy  Patient self care activities - Over the next 365 days, patient will: o Take medications as prescribed o Report any questions or concerns to PharmD and/or provider(s) Initial goal documentation.      Depression Screen PHQ 2/9 Scores 02/15/2020 08/13/2019 11/08/2018 11/02/2018 06/14/2018 06/08/2017 02/16/2017  PHQ - 2 Score 0 0 0 0 0 0 0  Fall Risk Fall Risk  02/15/2020 08/13/2019 03/14/2019 12/04/2018 11/08/2018  Falls in the past year? 0 0 0 0 0  Number falls in past yr: 0 - - 0 0  Injury with Fall? 0 - - 0 0  Risk for fall due to : Impaired balance/gait;Impaired vision - - Impaired mobility;Impaired balance/gait -  Follow up Falls prevention discussed - Falls evaluation  completed Falls evaluation completed Falls evaluation completed    FALL RISK PREVENTION PERTAINING TO THE HOME:  Any stairs in or around the home? Yes  If so, are there any without handrails? No  Home free of loose throw rugs in walkways, pet beds, electrical cords, etc? Yes  Adequate lighting in your home to reduce risk of falls? Yes   ASSISTIVE DEVICES UTILIZED TO PREVENT FALLS:  Life alert? No  Use of a cane, walker or w/c? Yes  Grab bars in the bathroom? Yes  Shower chair or bench in shower? Yes Elevated toilet seat or a handicapped toilet? Yes   TIMED UP AND GO:  Was the test performed? Yes .  Length of time to ambulate 10 feet: 15 sec.   Gait slow and steady without use of assistive device  Cognitive Function: MMSE - Mini Mental State Exam 06/08/2017  Orientation to time 5  Orientation to Place 5  Registration 3  Attention/ Calculation 3  Recall 1  Language- name 2 objects 2  Language- repeat 1  Language- follow 3 step command 3  Language- read & follow direction 1  Write a sentence 1  Copy design 1  Total score 26     6CIT Screen 02/15/2020  What Year? 0 points  What month? 0 points  Count back from 20 0 points  Months in reverse 0 points  Repeat phrase 4 points    Immunizations Immunization History  Administered Date(s) Administered   Fluad Quad(high Dose 65+) 11/02/2018, 11/30/2019   Influenza Split 11/14/2007, 01/01/2009, 12/23/2009   Influenza, High Dose Seasonal PF 11/02/2012, 12/12/2013, 11/15/2014, 11/24/2017   Influenza, Seasonal, Injecte, Preservative Fre 11/18/2015   Influenza,trivalent, recombinat, inj, PF 12/07/2010   Influenza-Unspecified 10/28/2011, 11/30/2014, 02/15/2017   PFIZER SARS-COV-2 Vaccination 03/23/2019, 04/14/2019   Pneumococcal Conjugate-13 12/13/2013   Pneumococcal-Unspecified 08/20/2008   Td 05/29/2002   Tdap 10/28/2011, 02/15/2017   Zoster 08/20/2008   Zoster Recombinat (Shingrix) 10/13/2016, 12/13/2016     TDAP status: Up to date  Flu Vaccine status: Up to date Done 11/30/19  Pneumococcal vaccine status: Up to date  Covid-19 vaccine status: Completed vaccines  Qualifies for Shingles Vaccine? Yes   Zostavax completed Yes   Shingrix Completed?: Yes  Screening Tests Health Maintenance  Topic Date Due   COVID-19 Vaccine (3 - Booster for Pfizer series) 10/12/2019   HEMOGLOBIN A1C  05/20/2020   OPHTHALMOLOGY EXAM  07/17/2020   FOOT EXAM  08/12/2020   TETANUS/TDAP  02/16/2027   INFLUENZA VACCINE  Completed   PNA vac Low Risk Adult  Completed   URINE MICROALBUMIN  Discontinued    Health Maintenance  Health Maintenance Due  Topic Date Due   COVID-19 Vaccine (3 - Booster for Pfizer series) 10/12/2019    Colorectal cancer screening: No longer required.    Additional Screening:   Vision Screening: Recommended annual ophthalmology exams for early detection of glaucoma and other disorders of the eye. Is the patient up to date with their annual eye exam?  Yes  Who is the provider or what is the name of the office in which the patient  attends annual eye exams? Dr Nancy Fetter and Dr Zadie Rhine   Dental Screening: Recommended annual dental exams for proper oral hygiene  Community Resource Referral / Chronic Care Management: CRR required this visit?  No   CCM required this visit?  No      Plan:     I have personally reviewed and noted the following in the patients chart:    Medical and social history  Use of alcohol, tobacco or illicit drugs   Current medications and supplements  Functional ability and status  Nutritional status  Physical activity  Advanced directives  List of other physicians  Hospitalizations, surgeries, and ER visits in previous 12 months  Vitals  Screenings to include cognitive, depression, and falls  Referrals and appointments  In addition, I have reviewed and discussed with patient certain preventive protocols, quality metrics,  and best practice recommendations. A written personalized care plan for preventive services as well as general preventive health recommendations were provided to patient.     Willette Brace, LPN   46/19/0122   Nurse Notes: None

## 2020-02-15 NOTE — Patient Instructions (Addendum)
[name] , Thank you for taking time to come for your Medicare Wellness Visit. I appreciate your ongoing commitment to your health goals. Please review the following plan we discussed and let me know if I can assist you in the future.   Screening recommendations/referrals: Colonoscopy: no longer required Recommended yearly ophthalmology/optometry visit for glaucoma screening and checkup Recommended yearly dental visit for hygiene and checkup  Vaccinations: Influenza vaccine: Done 11/30/19 Up to date Pneumococcal vaccine: Up to date Tdap vaccine: Up to date Shingles vaccine: Completed 8/15 & 12/14/19   Covid-19: Completed 1/22 & 2/136/21  Advanced directives: Advance directive discussed with you today. Even though you declined this today please call our office should you change your mind and we can give you the proper paperwork for you to fill out.  Conditions/risks identified: Maintain health  Next appointment: Follow up in one year for your annual wellness visit.   Preventive Care 8 Years and Older, Male Preventive care refers to lifestyle choices and visits with your health care provider that can promote health and wellness. What does preventive care include?  A yearly physical exam. This is also called an annual well check.  Dental exams once or twice a year.  Routine eye exams. Ask your health care provider how often you should have your eyes checked.  Personal lifestyle choices, including:  Daily care of your teeth and gums.  Regular physical activity.  Eating a healthy diet.  Avoiding tobacco and drug use.  Limiting alcohol use.  Practicing safe sex.  Taking low doses of aspirin every day.  Taking vitamin and mineral supplements as recommended by your health care provider. What happens during an annual well check? The services and screenings done by your health care provider during your annual well check will depend on your age, overall health, lifestyle risk  factors, and family history of disease. Counseling  Your health care provider may ask you questions about your:  Alcohol use.  Tobacco use.  Drug use.  Emotional well-being.  Home and relationship well-being.  Sexual activity.  Eating habits.  History of falls.  Memory and ability to understand (cognition).  Work and work Statistician. Screening  You may have the following tests or measurements:  Height, weight, and BMI.  Blood pressure.  Lipid and cholesterol levels. These may be checked every 5 years, or more frequently if you are over 73 years old.  Skin check.  Lung cancer screening. You may have this screening every year starting at age 54 if you have a 30-pack-year history of smoking and currently smoke or have quit within the past 15 years.  Fecal occult blood test (FOBT) of the stool. You may have this test every year starting at age 66.  Flexible sigmoidoscopy or colonoscopy. You may have a sigmoidoscopy every 5 years or a colonoscopy every 10 years starting at age 12.  Prostate cancer screening. Recommendations will vary depending on your family history and other risks.  Hepatitis C blood test.  Hepatitis B blood test.  Sexually transmitted disease (STD) testing.  Diabetes screening. This is done by checking your blood sugar (glucose) after you have not eaten for a while (fasting). You may have this done every 1-3 years.  Abdominal aortic aneurysm (AAA) screening. You may need this if you are a current or former smoker.  Osteoporosis. You may be screened starting at age 73 if you are at high risk. Talk with your health care provider about your test results, treatment options, and if  necessary, the need for more tests. Vaccines  Your health care provider may recommend certain vaccines, such as:  Influenza vaccine. This is recommended every year.  Tetanus, diphtheria, and acellular pertussis (Tdap, Td) vaccine. You may need a Td booster every 10  years.  Zoster vaccine. You may need this after age 64.  Pneumococcal 13-valent conjugate (PCV13) vaccine. One dose is recommended after age 83.  Pneumococcal polysaccharide (PPSV23) vaccine. One dose is recommended after age 58. Talk to your health care provider about which screenings and vaccines you need and how often you need them. This information is not intended to replace advice given to you by your health care provider. Make sure you discuss any questions you have with your health care provider. Document Released: 03/14/2015 Document Revised: 11/05/2015 Document Reviewed: 12/17/2014 Elsevier Interactive Patient Education  2017 [employer] Prevention in the Home Falls can cause injuries. They can happen to people of all ages. There are many things you can do to make your home safe and to help prevent falls. What can I do on the outside of my home?  Regularly fix the edges of walkways and driveways and fix any cracks.  Remove anything that might make you trip as you walk through a door, such as a raised step or threshold.  Trim any bushes or trees on the path to your home.  Use bright outdoor lighting.  Clear any walking paths of anything that might make someone trip, such as rocks or tools.  Regularly check to see if handrails are loose or broken. Make sure that both sides of any steps have handrails.  Any raised decks and porches should have guardrails on the edges.  Have any leaves, snow, or ice cleared regularly.  Use sand or salt on walking paths during winter.  Clean up any spills in your garage right away. This includes oil or grease spills. What can I do in the bathroom?  Use night lights.  Install grab bars by the toilet and in the tub and shower. Do not use towel bars as grab bars.  Use non-skid mats or decals in the tub or shower.  If you need to sit down in the shower, use a plastic, non-slip stool.  Keep the floor dry. Clean up any water that  spills on the floor as soon as it happens.  Remove soap buildup in the tub or shower regularly.  Attach bath mats securely with double-sided non-slip rug tape.  Do not have throw rugs and other things on the floor that can make you trip. What can I do in the bedroom?  Use night lights.  Make sure that you have a light by your bed that is easy to reach.  Do not use any sheets or blankets that are too big for your bed. They should not hang down onto the floor.  Have a firm chair that has side arms. You can use this for support while you get dressed.  Do not have throw rugs and other things on the floor that can make you trip. What can I do in the kitchen?  Clean up any spills right away.  Avoid walking on wet floors.  Keep items that you use a lot in easy-to-reach places.  If you need to reach something above you, use a strong step stool that has a grab bar.  Keep electrical cords out of the way.  Do not use floor polish or wax that makes floors slippery. If you must  use wax, use non-skid floor wax.  Do not have throw rugs and other things on the floor that can make you trip. What can I do with my stairs?  Do not leave any items on the stairs.  Make sure that there are handrails on both sides of the stairs and use them. Fix handrails that are broken or loose. Make sure that handrails are as long as the stairways.  Check any carpeting to make sure that it is firmly attached to the stairs. Fix any carpet that is loose or worn.  Avoid having throw rugs at the top or bottom of the stairs. If you do have throw rugs, attach them to the floor with carpet tape.  Make sure that you have a light switch at the top of the stairs and the bottom of the stairs. If you do not have them, ask someone to add them for you. What else can I do to help prevent falls?  Wear shoes that:  Do not have high heels.  Have rubber bottoms.  Are comfortable and fit you well.  Are closed at the  toe. Do not wear sandals.  If you use a stepladder:  Make sure that it is fully opened. Do not climb a closed stepladder.  Make sure that both sides of the stepladder are locked into place.  Ask someone to hold it for you, if possible.  Clearly mark and make sure that you can see:  Any grab bars or handrails.  First and last steps.  Where the edge of each step is.  Use tools that help you move around (mobility aids) if they are needed. These include:  Canes.  Walkers.  Scooters.  Crutches.  Turn on the lights when you go into a dark area. Replace any light bulbs as soon as they burn out.  Set up your furniture so you have a clear path. Avoid moving your furniture around.  If any of your floors are uneven, fix them.  If there are any pets around you, be aware of where they are.  Review your medicines with your doctor. Some medicines can make you feel dizzy. This can increase your chance of falling. Ask your doctor what other things that you can do to help prevent falls. This information is not intended to replace advice given to you by your health care provider. Make sure you discuss any questions you have with your health care provider. Document Released: 12/12/2008 Document Revised: 07/24/2015 Document Reviewed: 03/22/2014 Elsevier Interactive Patient Education  2017 [employer].

## 2020-02-20 ENCOUNTER — Ambulatory Visit: Payer: Medicare Other

## 2020-03-05 ENCOUNTER — Other Ambulatory Visit: Payer: Self-pay

## 2020-03-05 ENCOUNTER — Ambulatory Visit (INDEPENDENT_AMBULATORY_CARE_PROVIDER_SITE_OTHER): Payer: Medicare Other | Admitting: Family Medicine

## 2020-03-05 ENCOUNTER — Encounter: Payer: Self-pay | Admitting: Family Medicine

## 2020-03-05 VITALS — BP 126/74 | HR 104 | Temp 98.7°F | Wt 211.8 lb

## 2020-03-05 DIAGNOSIS — I10 Essential (primary) hypertension: Secondary | ICD-10-CM

## 2020-03-05 DIAGNOSIS — I2510 Atherosclerotic heart disease of native coronary artery without angina pectoris: Secondary | ICD-10-CM

## 2020-03-05 DIAGNOSIS — I350 Nonrheumatic aortic (valve) stenosis: Secondary | ICD-10-CM | POA: Diagnosis not present

## 2020-03-05 DIAGNOSIS — E782 Mixed hyperlipidemia: Secondary | ICD-10-CM | POA: Diagnosis not present

## 2020-03-05 DIAGNOSIS — Z794 Long term (current) use of insulin: Secondary | ICD-10-CM

## 2020-03-05 DIAGNOSIS — Z9861 Coronary angioplasty status: Secondary | ICD-10-CM

## 2020-03-05 DIAGNOSIS — E119 Type 2 diabetes mellitus without complications: Secondary | ICD-10-CM | POA: Diagnosis not present

## 2020-03-05 DIAGNOSIS — R600 Localized edema: Secondary | ICD-10-CM

## 2020-03-05 DIAGNOSIS — M48062 Spinal stenosis, lumbar region with neurogenic claudication: Secondary | ICD-10-CM | POA: Diagnosis not present

## 2020-03-05 DIAGNOSIS — I482 Chronic atrial fibrillation, unspecified: Secondary | ICD-10-CM

## 2020-03-05 DIAGNOSIS — E1142 Type 2 diabetes mellitus with diabetic polyneuropathy: Secondary | ICD-10-CM

## 2020-03-05 LAB — BASIC METABOLIC PANEL
BUN: 25 mg/dL — ABNORMAL HIGH (ref 6–23)
CO2: 29 mEq/L (ref 19–32)
Calcium: 9.2 mg/dL (ref 8.4–10.5)
Chloride: 103 mEq/L (ref 96–112)
Creatinine, Ser: 1.28 mg/dL (ref 0.40–1.50)
GFR: 49.52 mL/min — ABNORMAL LOW (ref >60.00)
Glucose, Bld: 289 mg/dL — ABNORMAL HIGH (ref 70–99)
Potassium: 4.2 mEq/L (ref 3.5–5.1)
Sodium: 140 mEq/L (ref 135–145)

## 2020-03-05 LAB — POCT GLYCOSYLATED HEMOGLOBIN (HGB A1C): Hemoglobin A1C: 7.5 % — AB (ref 4.0–5.6)

## 2020-03-05 NOTE — Progress Notes (Signed)
Subjective  CC:  Chief Complaint  Patient presents with  . Diabetes  . Extremity Weakness    Has been to [facility], needing a prescription for the correct compression stocking size    HPI: [name] is a 87 y.o. male who presents to the office today for follow up of diabetes and problems listed above in the chief complaint.   Diabetes follow up: His diabetic control is reported as Unchanged.  At last visit we increased Jardiance dose to 25 mg tolerating this well without adverse effects. He denies exertional CP or SOB or symptomatic hypoglycemia. He denies foot sores or painful paresthesias.   Hypertension and hyperlipidemia: Reviewed labs.  Reviewed cardiology notes.  Blood pressure has been well controlled.  Coronary artery disease is stable.  Aortic stenosis is stable without symptoms of compromise.  His last potassium level was mildly elevated and potassium supplements were decreased.  He remains on Lasix for lower extremity edema.  Potassium levels have not been rechecked.  He reports his swelling is well controlled.  He denies chest pain.  He is tolerating all of his medications.  He is complaining of tiredness weakness in his legs when walking.  He notices this when he is out shopping.  It is better if he is leaning over the shopping cart.  Denies back pain or sciatica.  No hip pain.  He denies calf pain.  He has known lumbar stenosis.  Denies knee pain.  His legs do not feel like they will give way.  No bowel or bladder incontinence.   Wt Readings from Last 3 Encounters:  03/05/20 211 lb 12.8 oz (96.1 kg)  02/15/20 210 lb 9.6 oz (95.5 kg)  01/17/20 210 lb 12.8 oz (95.6 kg)    BP Readings from Last 3 Encounters:  03/05/20 126/74  02/15/20 120/78  01/17/20 132/80    Assessment  1. Diabetes mellitus type 2, insulin dependent (HCC)   2. Essential (primary) hypertension   3. Mixed hyperlipidemia   4. Chronic atrial fibrillation (HCC)   5. Lower extremity edema   6.  CAD S/P PCI- Nov 2016   7. Diabetic peripheral neuropathy associated with type 2 diabetes mellitus (HCC)   8. Spinal stenosis of lumbar region with neurogenic claudication   9. Aortic stenosis, moderate      Plan   Diabetes is currently well controlled.  His control is improved.  He is at goal.  Continue current medications and recheck renal function and electrolytes.  Hypertension, hyperlipidemia, chronic A. fib, lower extremity edema and coronary artery disease are all well controlled.  Reviewed his medications in detail.  No changes made today.  Spinal stenosis with neurogenic claudication: Explained pathology.  Supportive care recommended.  Neuro exam is normal at rest today.  Strength in lower extremities is good bilaterally.  Does not sound like peripheral artery disease.   Follow up: 3 months for diabetes follow up. Orders Placed This Encounter  Procedures  . Basic metabolic panel  . POCT HgB A1C   No orders of the defined types were placed in this encounter.     Immunization History  Administered Date(s) Administered  . Fluad Quad(high Dose 65+) 11/02/2018, 11/30/2019  . Influenza Split 11/14/2007, 01/01/2009, 12/23/2009  . Influenza, High Dose Seasonal PF 11/02/2012, 12/12/2013, 11/15/2014, 11/24/2017  . Influenza, Seasonal, Injecte, Preservative Fre 11/18/2015  . Influenza,trivalent, recombinat, inj, PF 12/07/2010  . Influenza-Unspecified 10/28/2011, 11/30/2014, 02/15/2017  . PFIZER SARS-COV-2 Vaccination 03/23/2019, 04/14/2019, 12/06/2019  . Pneumococcal Conjugate-13 12/13/2013  .  Pneumococcal-Unspecified 08/20/2008  . Td 05/29/2002  . Tdap 10/28/2011, 02/15/2017  . Zoster 08/20/2008  . Zoster Recombinat (Shingrix) 10/13/2016, 12/13/2016    Diabetes Related Lab Review: Lab Results  Component Value Date   HGBA1C 7.5 (A) 03/05/2020   HGBA1C 8.4 11/21/2019   HGBA1C 8.0 (A) 08/13/2019    Lab Results  Component Value Date   MICROALBUR <0.7 03/14/2019   Lab  Results  Component Value Date   CREATININE 1.28 (H) 01/17/2020   BUN 24 01/17/2020   NA 141 01/17/2020   K 5.3 (H) 01/17/2020   CL 102 01/17/2020   CO2 28 01/17/2020   Lab Results  Component Value Date   CHOL 147 08/13/2019   CHOL 143 10/04/2018   CHOL 146 12/07/2017   Lab Results  Component Value Date   HDL 49.70 08/13/2019   HDL 44.60 10/04/2018   HDL 44.70 12/07/2017   Lab Results  Component Value Date   LDLCALC 79 08/13/2019   LDLCALC 77 10/04/2018   [place] 85 12/07/2017   Lab Results  Component Value Date   TRIG 93.0 08/13/2019   TRIG 106.0 10/04/2018   TRIG 78.0 12/07/2017   Lab Results  Component Value Date   CHOLHDL 3 08/13/2019   CHOLHDL 3 10/04/2018   CHOLHDL 3 12/07/2017   No results found for: LDLDIRECT The ASCVD Risk score Mikey Bussing DC Jr., et al., 2013) failed to calculate for the following reasons:   The 2013 ASCVD risk score is only valid for ages 43 to 41   The patient has a prior MI or stroke diagnosis I have reviewed the PMH, Fam and Soc history. Patient Active Problem List   Diagnosis Date Noted  . Aortic stenosis, moderate 05/22/2019    Priority: High  . Chronic diastolic CHF (congestive heart failure) ([place]) 03/14/2019    Priority: High  . Spinal stenosis of lumbar region 02/21/2018    Priority: High  . Diabetic peripheral neuropathy associated with type 2 diabetes mellitus ([place]) 05/05/2017    Priority: High    No pain   . Atherosclerotic heart disease of native coronary artery without angina pectoris 06/13/2016    Priority: High    Overview:  Overview:  RCA DES placed Nov 2016, LIMA-LAD presumed patent based on Myoview but no visualized, SVG-Dx occluded  Last Assessment & Plan:  RCA DES placed Nov 2016, LIMA-LAD presumed patent based on Myoview but no visualized, SVG-Dx occluded   . CVA (cerebral vascular accident) ([place]) 06/06/2016    Priority: High  . Recurrent coronary arteriosclerosis after percutaneous transluminal coronary  angioplasty 04/18/2015    Priority: High    Overview:  Overview:  RCA DES placed Nov 2016, LIMA-LAD presumed patent based on Myoview but no visualized, SVG-Dx occluded  Last Assessment & Plan:  RCA DES placed Nov 2016, LIMA-LAD presumed patent based on Myoview but no visualized, SVG-Dx occluded   . CAD S/P PCI- Nov 2016     Priority: High    RCA DES placed Nov 2016, LIMA-LAD presumed patent based on Myoview but no visualized, SVG-Dx occluded   . Current use of long term anticoagulation 02/06/2013    Priority: High    Overview:  Monitored by cardiology   . Chronic atrial fibrillation ([place]) 07/30/2011    Priority: High    CHADs VASc=5 for age, HTN, vascular disease, and DM   . Hx of CABG x 2 1990     Priority: High    Status post CABG x2 in 1990 including an LIMA  graft to the LAD, and a vein graft to the diagonal.    . Essential (primary) hypertension     Priority: High  . Diabetes mellitus type 2, insulin dependent (HCC)     Priority: High    Neg urine microalbuminuria, not on ace.  On farxiga   . Mixed hyperlipidemia     Priority: High  . Degeneration of lumbar intervertebral disc 02/21/2018    Priority: Medium  . Chronic vertigo 03/15/2014    Priority: Medium  . Obesity (BMI 30.0-34.9) 01/31/2012    Priority: Medium  . Enlarged prostate without lower urinary tract symptoms (luts) 01/04/2011    Priority: Medium    Overview:  Urology - avodart and tamuloscin   . Gastro-esophageal reflux disease without esophagitis 12/07/2010    Priority: Medium  . Epistaxis, recurrent 04/11/2015    Priority: Low  . Osteoarthritis, knee 01/31/2012    Priority: Low  . Allergic rhinitis 10/28/2011    Priority: Low  . Hearing loss 06/08/2011    Priority: Low  . Primary osteoarthritis of hand 06/08/2011    Priority: Low  . Hematuria 08/27/2008    Priority: Low    Overview:  Essential Hematuria - urology - Grapey  w/u benign  10/1 IMO update Overview:  Overview:  Essential  Hematuria - urology - Grapey  w/u benign   . 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 Cystoid macular edema of right eye 07/18/2019  . Retinal microaneurysm of right eye 07/18/2019  . Macular pucker, right eye 07/18/2019  . Lower extremity edema 03/14/2019    Social History: Patient  reports that he quit smoking about 61 years ago. His smoking use included cigarettes. He has a 30.00 pack-year smoking history. He has never used smokeless tobacco. He reports that he does not drink alcohol and does not use drugs.  Review of Systems: Ophthalmic: negative for eye pain, loss of vision or double vision Cardiovascular: negative for chest pain Respiratory: negative for SOB or persistent cough Gastrointestinal: negative for abdominal pain Genitourinary: negative for dysuria or gross hematuria MSK: negative for foot lesions Neurologic: negative for weakness or gait disturbance  Objective  Vitals: BP 126/74   Pulse (!) 104   Temp 98.7 F (37.1 C) (Temporal)   Wt 211 lb 12.8 oz (96.1 kg)   SpO2 93%   BMI 34.19 kg/m  General: well appearing, no acute distress  Psych:  Alert and oriented, normal mood and affect HEENT:  Normocephalic, atraumatic, moist mucous membranes, supple neck  Cardiovascular: Irregularly irregular rhythm, no edema, +1 bilateral femoral pulses Respiratory:  Good breath sounds bilaterally, CTAB with normal effort, no rales Skin:  Warm, no rashes Neurologic:   Bilateral lower extremity strength 5/5.    Diabetic education: ongoing education regarding chronic disease management for diabetes was given today. We continue to reinforce the ABC's of diabetic management: A1c (<7 or 8 dependent upon patient), tight blood pressure control, and cholesterol management with goal LDL < 100 minimally. We discuss diet strategies, exercise recommendations, medication options and possible side effects. At each visit, we review recommended immunizations and preventive  care recommendations for diabetics and stress that good diabetic control can prevent other problems. See below for this patient's data.    Commons side effects, risks, benefits, and alternatives for medications and treatment plan prescribed today were discussed, and the patient expressed understanding of the given instructions. Patient is instructed to call or message via MyChart if he/she has any questions or concerns regarding our  treatment plan. No barriers to understanding were identified. We discussed Red Flag symptoms and signs in detail. Patient expressed understanding regarding what to do in case of urgent or emergency type symptoms.   Medication list was reconciled, printed and provided to the patient in AVS. Patient instructions and summary information was reviewed with the patient as documented in the AVS. This note was prepared with assistance of Dragon voice recognition software. Occasional wrong-word or sound-a-like substitutions may have occurred due to the inherent limitations of voice recognition software  This visit occurred during the SARS-CoV-2 public health emergency.  Safety protocols were in place, including screening questions prior to the visit, additional usage of staff PPE, and extensive cleaning of exam room while observing appropriate contact time as indicated for disinfecting solutions.

## 2020-03-05 NOTE — Progress Notes (Signed)
Please call patient: I have reviewed his/her lab results. Potassium levels are now normal. Kidney function is stable. Sugar was 289. No changes needed at this time

## 2020-03-05 NOTE — Patient Instructions (Signed)
Please return in 3 months for diabetes follow up  We will call you with your potassium results.   Your diabetes is fairly well controlled. Continue the same medications.   If you have any questions or concerns, please don't hesitate to send me a message via MyChart or call the office at [phone]. Thank you for visiting with [place] today! It's our pleasure caring for you.

## 2020-03-14 ENCOUNTER — Ambulatory Visit (INDEPENDENT_AMBULATORY_CARE_PROVIDER_SITE_OTHER): Payer: Medicare Other | Admitting: Podiatry

## 2020-03-14 ENCOUNTER — Other Ambulatory Visit: Payer: Self-pay

## 2020-03-14 ENCOUNTER — Encounter: Payer: Self-pay | Admitting: Podiatry

## 2020-03-14 DIAGNOSIS — S61309A Unspecified open wound of unspecified finger with damage to nail, initial encounter: Secondary | ICD-10-CM | POA: Insufficient documentation

## 2020-03-14 DIAGNOSIS — W450XXA Nail entering through skin, initial encounter: Secondary | ICD-10-CM | POA: Insufficient documentation

## 2020-03-14 DIAGNOSIS — E1142 Type 2 diabetes mellitus with diabetic polyneuropathy: Secondary | ICD-10-CM

## 2020-03-14 NOTE — Progress Notes (Signed)
This patient returns to my office for at risk foot care.  This patient requires this care by a professional since this patient will be at risk due to having diabetic neuropathy and coagulation defect.  Patient is taking eliquiss.  Patient says his wife applies cream to his diabetic feet and she noted blood coming from second toenail left foot.  Patient does not remember any injury to the toenail.   This patient presents for at risk foot care today.  General Appearance  Alert, conversant and in no acute stress.  Vascular  Dorsalis pedis and posterior tibial  pulses are palpable  bilaterally.  Capillary return is within normal limits  bilaterally. Temperature is within normal limits  bilaterally.  Neurologic  Senn-Weinstein monofilament wire test within normal limits  bilaterally. Muscle power within normal limits bilaterally.  Nails Thick disfigured discolored nails with subungual debris  from hallux to fifth toes bilaterally. No evidence of bacterial infection or drainage bilaterally.  Orthopedic  No limitations of motion  feet .  No crepitus or effusions noted.  No bony pathology or digital deformities noted.  Skin  normotropic skin with no porokeratosis noted bilaterally.  No signs of infections or ulcers noted.     Toenail injury second toe left foot.  Consent was obtained for treatment procedures.   Excision of the nail plate second toenail left foot.  Neosporin/DSD.  RTC 4 weeks. Filed with dremel without incident.    Return office visit    10 weeks                Told patient to return for periodic foot care and evaluation due to potential at risk complications.   Gardiner Barefoot DPM

## 2020-03-28 ENCOUNTER — Ambulatory Visit: Payer: Medicare Other | Admitting: Podiatry

## 2020-03-31 ENCOUNTER — Telehealth: Payer: Self-pay

## 2020-03-31 DIAGNOSIS — S61208A Unspecified open wound of other finger without damage to nail, initial encounter: Secondary | ICD-10-CM | POA: Diagnosis not present

## 2020-03-31 NOTE — Telephone Encounter (Signed)
Had front office send to triage instructions to control until I could speak to PCP. As soon as I was given instructions per PCP, I called patient to have him come in at 11 to be seen. LMOVM to return call.

## 2020-03-31 NOTE — Telephone Encounter (Signed)
Nurse Assessment Nurse: Gildardo Pounds, RN, Amy Date/Time Eilene Ghazi Time): 03/31/2020 9:16:44 AM Confirm and document reason for call. If symptomatic, describe symptoms. ---Caller states he cut his finger on a razor blade. He was gonna shave, & it has a cap on the blade. When he took the cap off, he cut the tip of his finger. He is having trouble getting it to stop bleeding. It started 15-20 minutes ago. He at first said he did not put pressure on it, he put a bandaid on it & it was bleeding through. His wife put Bacitracin on it. When I told him he needs to squeeze & put pressure on it, he said he did. Does the patient have any new or worsening symptoms? ---Yes Will a triage be completed? ---Yes Related visit to physician within the last 2 weeks? ---No Does the PT have any chronic conditions? (i.e. diabetes, asthma, this includes High risk factors for pregnancy, etc.) ---Yes List chronic conditions. ---A-Fib- on a blood thinner, DM Is this a behavioral health or substance abuse call? ---No Guidelines Guideline Title Affirmed Question Affirmed Notes Nurse Date/Time (Eastern Time) Cuts and Lacerations [1] Bleeding AND [2] won't stop after 10 minutes of direct pressure (using correct technique) Lovelace, RN, Amy 03/31/2020 9:19:02 AM Disp. Time Eilene Ghazi Time) Disposition Final User 03/31/2020 9:16:01 AM Send to Urgent Queue [facility] PLEASE NOTE: All timestamps contained within this report are represented as [place] Standard Time. CONFIDENTIALTY NOTICE: This fax transmission is intended only for the addressee. It contains information that is legally privileged, confidential or otherwise protected from use or disclosure. If you are not the intended recipient, you are strictly prohibited from reviewing, disclosing, copying using or disseminating any of this information or taking any action in reliance on or regarding this information. If you have received this fax in error, please notify us  immediately by telephone so that we can arrange for its return to [place]. Phone: [phone], Toll-Free: [phone], Fax: [phone] Page: 2 of 2 Call Id: [id number] 03/31/2020 9:24:07 AM Go to ED Now Yes Lovelace, RN, Amy Caller Disagree/Comply Comply Caller Understands Yes PreDisposition InappropriateToAsk Care Advice Given Per Guideline GO TO ED NOW: * You need to be seen in the Emergency Department. CONTINUE DIRECT PRESSURE FOR BLEEDING: * Put direct pressure on the bleeding area with a sterile gauze or clean cloth. * Continue doing this until seen. CARE ADVICE given per Cuts and Lacerations (Adult) guideline. Comments User: Wayne Sever, RN Date/Time Eilene Ghazi Time): 03/31/2020 9:29:28 AM Patient was very upset with instructions given. When asked how deep the cut was, he said "I don't know, I'm not a doctor". Patient was very rude. He insinuated that his doctor office did not want to help him. Referrals GO TO FACILITY UNDECIDED

## 2020-04-02 ENCOUNTER — Ambulatory Visit (INDEPENDENT_AMBULATORY_CARE_PROVIDER_SITE_OTHER): Payer: Medicare Other | Admitting: Family Medicine

## 2020-04-02 ENCOUNTER — Other Ambulatory Visit: Payer: Self-pay

## 2020-04-02 ENCOUNTER — Encounter: Payer: Self-pay | Admitting: Family Medicine

## 2020-04-02 VITALS — BP 130/78 | HR 96 | Temp 98.0°F

## 2020-04-02 DIAGNOSIS — S61209D Unspecified open wound of unspecified finger without damage to nail, subsequent encounter: Secondary | ICD-10-CM | POA: Diagnosis not present

## 2020-04-02 NOTE — Progress Notes (Signed)
Subjective  CC:  Chief Complaint  Patient presents with  . Laceration    Tip of pointer finger on left hand - happened when he was removing the cap off of a new razor     HPI: [name] is a 87 y.o. male who presents to the office today to address the problems listed above in the chief complaint.  87 year old male who very small piece of the tip left index finger on January 31 with a razor blade accidentally. He is on blood thinner. I reviewed the [facility] notes from [facility]. Bleeding was controlled with pressure, Gelfoam and bandages. Since he is done fine. He is here for follow-up. No pain or further bleeding noted. He still has the same bandages on.  Assessment  1. Avulsion, finger tip, subsequent encounter      Plan   Avulsion fingertip, follow-up: Remove dressing. Wound with scab. No active bleeding. No signs of infection. Redressed. Appropriate wound care instructions given follow-up as needed.  Follow up: Diabetic follow-up 06/05/2020  No orders of the defined types were placed in this encounter.  No orders of the defined types were placed in this encounter.     I reviewed the patients updated PMH, FH, and SocHx.    Patient Active Problem List   Diagnosis Date Noted  . Aortic stenosis, moderate 05/22/2019    Priority: High  . Chronic diastolic CHF (congestive heart failure) ([place]) 03/14/2019    Priority: High  . Spinal stenosis of lumbar region 02/21/2018    Priority: High  . Diabetic peripheral neuropathy associated with type 2 diabetes mellitus ([place]) 05/05/2017    Priority: High  . Atherosclerotic heart disease of native coronary artery without angina pectoris 06/13/2016    Priority: High  . CVA (cerebral vascular accident) ([place]) 06/06/2016    Priority: High  . Recurrent coronary arteriosclerosis after percutaneous transluminal coronary angioplasty 04/18/2015    Priority: High  . CAD S/P PCI- Nov 2016     Priority: High  . Current use of long  term anticoagulation 02/06/2013    Priority: High  . Chronic atrial fibrillation ([place]) 07/30/2011    Priority: High  . Hx of CABG x 2 1990     Priority: High  . Essential (primary) hypertension     Priority: High  . Diabetes mellitus type 2, insulin dependent ([place])     Priority: High  . Mixed hyperlipidemia     Priority: High  . Degeneration of lumbar intervertebral disc 02/21/2018    Priority: Medium  . Chronic vertigo 03/15/2014    Priority: Medium  . Obesity (BMI 30.0-34.9) 01/31/2012    Priority: Medium  . Enlarged prostate without lower urinary tract symptoms (luts) 01/04/2011    Priority: Medium  . Gastro-esophageal reflux disease without esophagitis 12/07/2010    Priority: Medium  . Epistaxis, recurrent 04/11/2015    Priority: Low  . Osteoarthritis, knee 01/31/2012    Priority: Low  . Allergic rhinitis 10/28/2011    Priority: Low  . Hearing loss 06/08/2011    Priority: Low  . Primary osteoarthritis of hand 06/08/2011    Priority: Low  . Hematuria 08/27/2008    Priority: Low  . Traumatic avulsion of nail plate of finger 62/13/0865  . Nail, injury by, initial encounter 03/14/2020  . 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 Cystoid macular edema of right eye 07/18/2019  . Retinal microaneurysm of right eye 07/18/2019  . Macular pucker, right eye 07/18/2019  . Lower extremity  edema 03/14/2019   Current Meds  Medication Sig  . apixaban (ELIQUIS) 5 MG TABS tablet Take 1 tablet (5 mg total) by mouth 2 (two) times daily.  [name] atorvastatin (LIPITOR) 40 MG tablet Take 1 tablet (40 mg total) by mouth daily at 6 PM.  . AVODART 0.5 MG capsule Take 0.5 mg by mouth every Monday, Wednesday, and Friday.   . diltiazem (CARDIZEM CD) 360 MG 24 hr capsule Take 1 capsule (360 mg total) by mouth daily.  . empagliflozin (JARDIANCE) 10 MG TABS tablet Take 10 mg by mouth daily.  . fluticasone (FLONASE) 50 MCG/ACT nasal spray Place 1 spray into both nostrils daily.  .  furosemide (LASIX) 40 MG tablet Take 1 tablet (40 mg total) by mouth daily.  [name] gabapentin (NEURONTIN) 300 MG capsule TAKE 1 CAPSULE(300 MG) BY MOUTH THREE TIMES DAILY  . insulin glargine (LANTUS) 100 UNIT/ML injection Inject 40 Units into the skin every morning.  . metFORMIN (GLUCOPHAGE) 500 MG tablet Take 500 mg by mouth 2 (two) times daily with a meal.  . metoprolol succinate (TOPROL-XL) 100 MG 24 hr tablet TAKE 1 TABLET BY MOUTH TWICE DAILY (Patient taking differently: Take 100 mg by mouth 2 (two) times daily.)  . nitroGLYCERIN (NITROSTAT) 0.4 MG SL tablet Place 0.4 mg under the tongue every 5 (five) minutes as needed for chest pain (x 3 doses).  . pantoprazole (PROTONIX) 40 MG tablet TAKE 1 TABLET BY MOUTH DAILY AT NOON  . potassium chloride SA (KLOR-CON) 20 MEQ tablet Take 1 tablet every other day  . PRECISION XTRA TEST STRIPS test strip USE TO TEST BLOOD SUGAR 1 TO 2 TIMES DAILY  . tamsulosin (FLOMAX) 0.4 MG CAPS capsule Take 0.4 mg by mouth every Tuesday, Thursday, Saturday, and Sunday.    Allergies: Patient is allergic to erythromycin base, cefadroxil, ciprofloxacin, and erythromycin. Family History: Patient family history includes Brain cancer in his father; Throat cancer in his brother. Social History:  Patient  reports that he quit smoking about 61 years ago. His smoking use included cigarettes. He has a 30.00 pack-year smoking history. He has never used smokeless tobacco. He reports that he does not drink alcohol and does not use drugs.  Review of Systems: Constitutional: Negative for fever malaise or anorexia Cardiovascular: negative for chest pain Respiratory: negative for SOB or persistent cough Gastrointestinal: negative for abdominal pain  Objective  Vitals: BP 130/78   Pulse 96   Temp 98 F (36.7 C) (Temporal)   SpO2 97%  General: no acute distress , A&Ox3 Left index finger: 5 to 6 mm scab and fingertip without redness, drainage. Full range of motion  finger.    Commons side effects, risks, benefits, and alternatives for medications and treatment plan prescribed today were discussed, and the patient expressed understanding of the given instructions. Patient is instructed to call or message via MyChart if he/she has any questions or concerns regarding our treatment plan. No barriers to understanding were identified. We discussed Red Flag symptoms and signs in detail. Patient expressed understanding regarding what to do in case of urgent or emergency type symptoms.   Medication list was reconciled, printed and provided to the patient in AVS. Patient instructions and summary information was reviewed with the patient as documented in the AVS. This note was prepared with assistance of Dragon voice recognition software. Occasional wrong-word or sound-a-like substitutions may have occurred due to the inherent limitations of voice recognition software  This visit occurred during the SARS-CoV-2 public health emergency.  Safety protocols were in place, including screening questions prior to the visit, additional usage of staff PPE, and extensive cleaning of exam room while observing appropriate contact time as indicated for disinfecting solutions.

## 2020-04-04 ENCOUNTER — Ambulatory Visit (INDEPENDENT_AMBULATORY_CARE_PROVIDER_SITE_OTHER): Payer: Medicare Other | Admitting: Podiatry

## 2020-04-04 ENCOUNTER — Encounter: Payer: Self-pay | Admitting: Podiatry

## 2020-04-04 DIAGNOSIS — D689 Coagulation defect, unspecified: Secondary | ICD-10-CM | POA: Diagnosis not present

## 2020-04-04 DIAGNOSIS — E1142 Type 2 diabetes mellitus with diabetic polyneuropathy: Secondary | ICD-10-CM | POA: Diagnosis not present

## 2020-04-04 DIAGNOSIS — M79674 Pain in right toe(s): Secondary | ICD-10-CM

## 2020-04-04 DIAGNOSIS — B351 Tinea unguium: Secondary | ICD-10-CM

## 2020-04-04 NOTE — Progress Notes (Signed)
This patient returns to my office for at risk foot care.  This patient requires this care by a professional since this patient will be at risk due to having diabetic neuropathy and coagulation defect.  Patient is taking eliquiss.  Patient says his wife applies cream to his diabetic feet and she noted blood coming from second toenail left foot.  Patient does not remember any injury to the toenail.   This patient presents for at risk foot care today.  General Appearance  Alert, conversant and in no acute stress.  Vascular  Dorsalis pedis and posterior tibial  pulses are palpable  bilaterally.  Capillary return is within normal limits  bilaterally. Temperature is within normal limits  bilaterally.  Neurologic  Senn-Weinstein monofilament wire test within normal limits  bilaterally. Muscle power within normal limits bilaterally.  Nails Thick disfigured discolored nails with subungual debris  from hallux to fifth toes bilaterally. No evidence of bacterial infection or drainage bilaterally.  Orthopedic  No limitations of motion  feet .  No crepitus or effusions noted.  No bony pathology or digital deformities noted.  Skin  normotropic skin with no porokeratosis noted bilaterally.  No signs of infections or ulcers noted.     Toenail injury second toe left foot.  Consent was obtained for treatment procedures.   Excision of the nail plate second toenail left foot.  Neosporin/DSD.  RTC 4 weeks. Filed with dremel without incident.    Return office visit    10 weeks                Told patient to return for periodic foot care and evaluation due to potential at risk complications.   Khyra Viscuso DPM  

## 2020-04-21 ENCOUNTER — Encounter (INDEPENDENT_AMBULATORY_CARE_PROVIDER_SITE_OTHER): Payer: Medicare Other | Admitting: Ophthalmology

## 2020-04-29 ENCOUNTER — Telehealth: Payer: Self-pay

## 2020-04-29 NOTE — Telephone Encounter (Signed)
[name] came into the office today asking if Dr. Jonni Sanger would write him a prescription for antibiotics before he goes in for his dental appointment. Pt said his appointment is not scheduled yet because he wants to get the medication first. Pt wants to use [employer]. Please advise.

## 2020-04-30 NOTE — Telephone Encounter (Signed)
Mr. Hurta called and wants to let Dr. Jonni Sanger know he will be here tomorrow at 9 am to pick up a prescription??

## 2020-05-01 MED ORDER — AMOXICILLIN 500 MG PO CAPS: ORAL_CAPSULE | ORAL | 2 refills | Status: DC

## 2020-05-01 NOTE — Telephone Encounter (Signed)
Please advise 

## 2020-05-01 NOTE — Telephone Encounter (Signed)
amox printed. Ready for pick up.

## 2020-05-02 ENCOUNTER — Other Ambulatory Visit: Payer: Self-pay | Admitting: Family Medicine

## 2020-05-05 ENCOUNTER — Telehealth: Payer: Self-pay | Admitting: Cardiology

## 2020-05-05 NOTE — Telephone Encounter (Signed)
New message:    Patient calling stating that he has to call [facility] on Sunday and they told him to follow up with the doctor and the patient would like to see Dr.Jordan. please call patient.

## 2020-05-05 NOTE — Telephone Encounter (Signed)
Called pt regarding needing follow up appointment after calling EMS on Sunday night.  Pt states that at 2-3am on Sunday he woke up with left arm pain and was sweaty. Pt states that he had his wife call EMS. Pt said that EMS did a full assessment on him including an EKG and told him that couldn't find a reason to take him to the ED and for him to give his cardiologist a call this morning.  Appointment set up for pt on March 16th. Pt verbalizes understand. Reviewed ED protocol with pt and told him that if he call our office back if he needs.

## 2020-05-10 NOTE — Progress Notes (Signed)
Cardiology Office Note   Date:  05/14/2020   ID:  [name], DOB Jul 17, 1928, MRN [id number]  PCP:  [name], MD  Cardiologist:  Sherisa Gilvin [place], MD EP: None  Chief Complaint  Patient presents with   Chest Pain      History of Present Illness: [name] is a 87 y.o. male with PMH of CAD s/p CABG in 1990 with subsequent DES to RCA in 2006, permanent atrial fibrillation, chronic diastolic CHF, aortic stenosis, HTN, HLD, carotid artery disease, DM type 2, CVA, who presents for  follow-up.   He was  admitted to the hospital 05/13/19-05/18/19 for acute respiratory failure 2/2 PNA and pleural effusion, managed with IV antibiotics and a thoracentesis. Cardiology followed that admission for assistance with atrial fibrillation with RVR and he was discharged home on diltiazem 360mg  and metoprolol succinate 100mg  BID for rate control, apixaban 5mg  BID for stroke ppx, and lasix 40mg  daily for CHF. Echo 05/14/19 showed EF 60-65%, moderate concentric LVH, indeterminate LV diastolic function, mild AI, moderate AS, and ascending aortic aneurysm of 4.8cm (up from 4.3cm 09/2018).   Earlier this month he awoke with left arm pain and felt sweaty. His wife called EMS. Ecg was done without acute change. He did not go to the hospital. He states pain abated and eventually went away after a day and a half. It did hurt when he flexed his arm. No chest pain or SOB. No recurrent pain since then. He never had this kind of pain before.    Past Medical History:  Diagnosis Date   Abdominal pain    Acute renal failure ([place])     resolved   Arthritis    "hands & legs" (11/'10/2014)   Ataxia    Atrial fibrillation ([facility])    Bladder outlet obstruction    Bladder outlet obstruction    BPH (benign prostatic hyperplasia)    CAD (coronary artery disease)    a. CABG IN 1989. b. 01/08/2015 CTO of ost LAD, LIMA to LAD not visualized but assumed patent given myoview finding, occluded SVG to diagonal,  99% mid RCA tx w/ SYNERGY DES 3X28 mm   Constipation    Diabetes mellitus type 2, insulin dependent ([place])    Epistaxis, recurrent Feb 2017   GERD (gastroesophageal reflux disease)    HTN (hypertension)    Hx of bacterial pneumonia    Hyperlipemia    Kidney stones    Mild aortic stenosis    Pneumonia 04/2019   Rib fractures    left rib fractures being treated with pain medications   Stented coronary artery Nov 2016   RCA DES   Urinary tract infection     Enterococcus    Past Surgical History:  Procedure Laterality Date   [place] N/A 01/08/2015   Procedure: Left Heart Cath and Cors/Grafts Angiography;  Surgeon: Cesiah Westley M [place], MD;  Location: [facility];  Service: Cardiovascular;  Laterality: N/A;   CARDIAC CATHETERIZATION  01/08/2015   Procedure: Coronary Stent Intervention;  Surgeon: Rasheda Ledger M [place], MD;  Location: [facility];  Service: Cardiovascular;;   CARPAL TUNNEL RELEASE Right 08/2010   CATARACT EXTRACTION W/ INTRAOCULAR LENS  IMPLANT, BILATERAL Bilateral    CORONARY ANGIOPLASTY  01/08/15   RCA DES   CORONARY ARTERY BYPASS GRAFT  1989   "CABG X 2"   IR THORACENTESIS ASP PLEURAL SPACE W/IMG GUIDE  05/14/2019   JOINT REPLACEMENT  TONSILLECTOMY     TOTAL HIP ARTHROPLASTY Right 2000     Current Outpatient Medications  Medication Sig Dispense Refill   amoxicillin (AMOXIL) 500 MG capsule Take 4 caps 1 hour prior to dental procedure. 4 capsule 2   apixaban (ELIQUIS) 5 MG TABS tablet Take 1 tablet (5 mg total) by mouth 2 (two) times daily. 60 tablet 2   atorvastatin (LIPITOR) 40 MG tablet Take 1 tablet (40 mg total) by mouth daily at 6 PM. 30 tablet 2   AVODART 0.5 MG capsule Take 0.5 mg by mouth every Monday, Wednesday, and Friday.      diltiazem (CARDIZEM CD) 360 MG 24 hr capsule Take 1 capsule (360 mg total) by mouth daily. 30 capsule 6   empagliflozin (JARDIANCE) 10 MG TABS tablet  Take 10 mg by mouth daily.     fluticasone (FLONASE) 50 MCG/ACT nasal spray SHAKE LIQUID AND USE 1 SPRAY IN EACH NOSTRIL DAILY 16 g 5   furosemide (LASIX) 40 MG tablet Take 1 tablet (40 mg total) by mouth daily. 90 tablet 3   gabapentin (NEURONTIN) 300 MG capsule TAKE 1 CAPSULE(300 MG) BY MOUTH THREE TIMES DAILY 270 capsule 3   insulin glargine (LANTUS) 100 UNIT/ML injection Inject 40 Units into the skin every morning.     metFORMIN (GLUCOPHAGE) 500 MG tablet Take 500 mg by mouth 2 (two) times daily with a meal.     metoprolol succinate (TOPROL-XL) 100 MG 24 hr tablet TAKE 1 TABLET BY MOUTH TWICE DAILY (Patient taking differently: Take 100 mg by mouth 2 (two) times daily.) 180 tablet 3   nitroGLYCERIN (NITROSTAT) 0.4 MG SL tablet Place 0.4 mg under the tongue every 5 (five) minutes as needed for chest pain (x 3 doses).     pantoprazole (PROTONIX) 40 MG tablet TAKE 1 TABLET BY MOUTH DAILY AT NOON 90 tablet 3   potassium chloride SA (KLOR-CON) 20 MEQ tablet Take 1 tablet every other day 90 tablet 3   PRECISION XTRA TEST STRIPS test strip USE TO TEST BLOOD SUGAR 1 TO 2 TIMES DAILY 200 strip 6   tamsulosin (FLOMAX) 0.4 MG CAPS capsule Take 0.4 mg by mouth every Tuesday, Thursday, Saturday, and Sunday.     No current facility-administered medications for this visit.    Allergies:   Erythromycin base, Cefadroxil, Ciprofloxacin, and Erythromycin    Social History:  The patient  reports that he quit smoking about 62 years ago. His smoking use included cigarettes. He has a 30.00 pack-year smoking history. He has never used smokeless tobacco. He reports that he does not drink alcohol and does not use drugs.   Family History:  The patient's family history includes Brain cancer in his father; Throat cancer in his brother.    ROS:  Please see the history of present illness.   Otherwise, review of systems are positive for none.   All other systems are reviewed and negative.    PHYSICAL  EXAM: VS:  BP 130/73    Pulse 71    Ht 5\' 2"  (1.575 m)    Wt 213 lb 6.4 oz (96.8 kg)    SpO2 94%    BMI 39.03 kg/m  , BMI Body mass index is 39.03 kg/m. GEN: Well nourished, well developed, in no acute distress HEENT: normal Neck: no JVD, carotid bruits, or masses Cardiac: IRIR; gr 2/6 systolic murmur RUSB>>apex, rubs, or gallops, trace LE edema  Respiratory:  clear to auscultation bilaterally, normal work of breathing GI: soft, nontender, nondistended, +  BS MS: no deformity or atrophy. No biceps tenderness Skin: warm and dry, no rash Neuro:  Strength and sensation are intact Psych: euthymic mood, full affect   EKG:  EKG is not ordered today. Ecg reviewed from 05/04/20 showed Afib with controlled rate 73. Nonspecific TWA. No acute change. I have personally reviewed and interpreted this study.    Recent Labs: 08/13/2019: Hemoglobin 11.7; Platelets 283.0 11/21/2019: TSH 2.06 01/28/2020: ALT 19 03/05/2020: BUN 25; Creatinine, Ser 1.28; Potassium 4.2; Sodium 140    Lipid Panel    Component Value Date/Time   CHOL 147 08/13/2019 1154   TRIG 93.0 08/13/2019 1154   HDL 49.70 08/13/2019 1154   CHOLHDL 3 08/13/2019 1154   VLDL 18.6 08/13/2019 1154   LDLCALC 79 08/13/2019 1154      Wt Readings from Last 3 Encounters:  05/14/20 213 lb 6.4 oz (96.8 kg)  03/05/20 211 lb 12.8 oz (96.1 kg)  02/15/20 210 lb 9.6 oz (95.5 kg)      Other studies Reviewed: Additional studies/ records that were reviewed today include:   2D echo 05/14/2019 IMPRESSIONS   1. Left ventricular ejection fraction, by estimation, is 60 to 65%. The  left ventricle has normal function. The left ventricle has no regional  wall motion abnormalities. There is moderate concentric left ventricular  hypertrophy. Left ventricular  diastolic parameters are indeterminate.  2. Right ventricular systolic function is normal. The right ventricular  size is normal.  3. The mitral valve is normal in structure. No evidence of  mitral valve  regurgitation. No evidence of mitral stenosis.  4. The aortic valve is normal in structure. Aortic valve regurgitation is  mild. Moderate aortic valve stenosis. Aortic valve area, by VTI measures  1.27 cm. Aortic valve mean gradient measures 19.3 mmHg. Aortic valve Vmax  measures 2.75 m/s.  5. Compared with th echo 09/2018, ascending aorta has increased from 4.3  cm to 4.8cm. . Aortic dilatation noted. There is moderate dilatation of  the ascending aorta measuring 48 mm.  6. The inferior vena cava is normal in size with greater than 50%  respiratory variability, suggesting right atrial pressure of 3 mmHg.   Left heart catheterization 2016:  Prox RCA lesion, 40% stenosed.  LM lesion, 20% stenosed.  Ost LAD to Prox LAD lesion, 100% stenosed.  SVG .  Origin lesion, 100% stenosed.  Mid RCA-2 lesion, 70% stenosed.  Mid RCA-1 lesion, 99% stenosed. Post intervention, there is a 0% residual stenosis.   1. Severe 2 vessel obstructive CAD. CTO of the origin of the LAD. Critical mid RCA stenosis. 2. Occluded SVG to the diagonal 3. LIMA to the LAD was not visualized but assumed patent based on clinical history and Myoview findings. 4. Normal LV EDP. 5. Successful stenting of the Mid RCA with DES. Very difficult procedure due to vessel tortuosity.  Plan: DAPT with ASA and Plavix for one month then stop ASA and continue Plavix for at least one year. Resume Coumadin tomorrow. Will assess LV function with an Echo. Stop prilosec and start protonix. Anticipate DC in am if stable. Patient noted to be bradycardic throughout case so will reduce metoprolol to 50 mg daily.  CT CHEST WITHOUT CONTRAST  TECHNIQUE: Multidetector CT imaging of the chest was performed following the standard protocol without IV contrast.  COMPARISON:  CT angiography from June 02, 2016 and from October 03, 2017  FINDINGS: Cardiovascular: Post median sternotomy for CABG. Ascending thoracic aorta  measuring 4.3 cm with stable appearance. Calcified atheromatous  plaque of the thoracic aorta and its branches in the chest.  Heart size is stable, extensive calcified coronary artery disease post CABG and LIMA grafting.  Engorged central pulmonary vasculature 3.2 cm caliber of the main pulmonary artery Limited assessment of cardiovascular structures given lack of intravenous contrast.  Mediastinum/Nodes: Thoracic inlet structures are normal. No axillary lymphadenopathy. No mediastinal lymphadenopathy. No gross hilar lymphadenopathy.  Lungs/Pleura: Bandlike opacity with mild ground-glass attenuation in the LEFT lung base. Bandlike changes are present in the LEFT lower lobe. Resolution of pleural fluid seen on previous chest x-rays from March of 2021  Pulmonary nodule in the superior segment of the RIGHT lower lobe 6 mm unchanged from previous imaging (image 49, series 3) no consolidation. No pleural effusion.  (Image 72, series 3) 4 mm nodule with some surrounding ground-glass opacity in the central LEFT lower lobe  Airways are patent.  Upper Abdomen: Incidental imaging of upper abdominal contents with signs of vascular disease of the abdominal aorta. Small hiatal hernia. No acute upper abdominal process.  Musculoskeletal: No acute bone finding. No destructive bone process.  IMPRESSION: 1. Stable appearance of the mild aneurysmal dilation ascending thoracic aorta measuring 4.3 cm. Recommend annual imaging followup by CTA or MRA. This recommendation follows 2010 ACCF/AHA/AATS/ACR/ASA/SCA/SCAI/SIR/STS/SVM Guidelines for the Diagnosis and Management of Patients with Thoracic Aortic Disease. Circulation. 2010; 121: R518-A416. Aortic aneurysm NOS (ICD10-I71.9) 2. Stable 6 mm nodule in the superior segment of the RIGHT lower lobe. 3. 4 mm nodule with some surrounding ground-glass opacity in the central LEFT lower lobe. This is new compared to prior imaging.  No follow-up needed if patient is low-risk. Non-contrast chest CT can be considered in 12 months if patient is high-risk. This recommendation follows the consensus statement: Guidelines for Management of Incidental Pulmonary Nodules Detected on CT Images: From the Fleischner Society 2017; Radiology 2017; 284:228-243. 4. Resolution of pleural fluid seen on previous chest x-rays from March of 2021. Bandlike opacity in the area of prior LEFT lower lobe airspace disease suggests mixture of atelectasis and post infectious scarring. 5. Stable signs of vascular disease of the thoracic aorta and its branches in the chest. 6. Aortic atherosclerosis.  Aortic Atherosclerosis (ICD10-I70.0).  Aortic aneurysm NOS (ICD10-I71.9).   Electronically Signed   By: Zetta Bills M.D.   On: 02/05/2020 08:23  ASSESSMENT AND PLAN:  1. CAD s/p CABG in 1990 with DES to RCA in 2006: Not on aspirin given need for anticoagulation - Continue statin - Continue metoprolol - he had an episode of left arm pain and sweating. Some pain with movement of arm. It is possible this could be ischemic but Ecg was nonacute and he had never experienced this type of pain with cardiac issues in the past. Will continue to monitor for now. Having his 89th birthday party next week. Will call if he has further problems.   2. Permanent atrial fibrillation: HR is well controlled  - No complaints of bleeding - Continue metoprolol succinate and diltiazem for rate control - Continue eliquis for stroke ppx. (does not meet requirements for reduced dose)  3. Chronic diastolic CHF: no volume overload complaints and he appears euvolemic on exam - Continue lasix 40mg  daily and potassium - Continue metoprolol succinate - Continue to limit salt intake  4. HTN: BP is well controlled - Continue metoprolol succinate, diltiazem, and lasix  5. Aortic stenosis: moderate on echo 05/14/19 - Continue annual surveillance echo's - will go  ahead and arrange follow up Echo   6. Ascending  aortic aneurysm: last echo 05/14/19 showed 4.8cm aneurysm, up from 4.3 09/2018. More recent CT 02/05/20 showed it was 4.3 cm. Stable. Will check with Echo.     Current medicines are reviewed at length with the patient today.  The patient does not have concerns regarding medicines.  The following changes have been made:  As above  Labs/ tests ordered today include:   No orders of the defined types were placed in this encounter.    Disposition:   FU 6 months with fasting lab.   Signed, Doy Taaffe [place], MD  05/14/2020 9:27 AM

## 2020-05-14 ENCOUNTER — Encounter: Payer: Self-pay | Admitting: Cardiology

## 2020-05-14 ENCOUNTER — Other Ambulatory Visit: Payer: Self-pay

## 2020-05-14 ENCOUNTER — Ambulatory Visit (INDEPENDENT_AMBULATORY_CARE_PROVIDER_SITE_OTHER): Payer: Medicare Other | Admitting: Cardiology

## 2020-05-14 VITALS — BP 130/73 | HR 71 | Ht 62.0 in | Wt 213.4 lb

## 2020-05-14 DIAGNOSIS — I2510 Atherosclerotic heart disease of native coronary artery without angina pectoris: Secondary | ICD-10-CM | POA: Diagnosis not present

## 2020-05-14 DIAGNOSIS — I5032 Chronic diastolic (congestive) heart failure: Secondary | ICD-10-CM | POA: Diagnosis not present

## 2020-05-14 DIAGNOSIS — I350 Nonrheumatic aortic (valve) stenosis: Secondary | ICD-10-CM | POA: Diagnosis not present

## 2020-05-14 DIAGNOSIS — I10 Essential (primary) hypertension: Secondary | ICD-10-CM

## 2020-05-14 DIAGNOSIS — Z9861 Coronary angioplasty status: Secondary | ICD-10-CM | POA: Diagnosis not present

## 2020-05-14 DIAGNOSIS — E7800 Pure hypercholesterolemia, unspecified: Secondary | ICD-10-CM

## 2020-05-14 DIAGNOSIS — I482 Chronic atrial fibrillation, unspecified: Secondary | ICD-10-CM

## 2020-05-14 NOTE — Patient Instructions (Signed)
Medication Instructions:  Continue same medications *If you need a refill on your cardiac medications before your next appointment, please call your pharmacy*   Lab Work: Have fasting lab bmet,lipid and hepatic panels done 1 week before Sept appointment    Testing/Procedures: Echo   Follow-Up: At [facility], you and your health needs are our priority.  As part of our continuing mission to provide you with exceptional heart care, we have created designated Provider Care Teams.  These Care Teams include your primary Cardiologist (physician) and Advanced Practice Providers (APPs -  Physician Assistants and Nurse Practitioners) who all work together to provide you with the care you need, when you need it.  We recommend signing up for the patient portal called "MyChart".  Sign up information is provided on this After Visit Summary.  MyChart is used to connect with patients for Virtual Visits (Telemedicine).  Patients are able to view lab/test results, encounter notes, upcoming appointments, etc.  Non-urgent messages can be sent to your provider as well.   To learn more about what you can do with MyChart, go to [email or web address].    Your next appointment:  6 months     Call in July to schedule Sept appointment    The format for your next appointment: Office     Provider:  Dr.Jordan

## 2020-05-26 ENCOUNTER — Other Ambulatory Visit: Payer: Self-pay

## 2020-05-26 ENCOUNTER — Ambulatory Visit (INDEPENDENT_AMBULATORY_CARE_PROVIDER_SITE_OTHER): Payer: Medicare Other | Admitting: Family Medicine

## 2020-05-26 ENCOUNTER — Other Ambulatory Visit: Payer: Self-pay | Admitting: Family Medicine

## 2020-05-26 ENCOUNTER — Encounter: Payer: Self-pay | Admitting: Family Medicine

## 2020-05-26 VITALS — BP 124/72 | HR 78 | Temp 98.8°F | Wt 207.0 lb

## 2020-05-26 DIAGNOSIS — I10 Essential (primary) hypertension: Secondary | ICD-10-CM | POA: Diagnosis not present

## 2020-05-26 DIAGNOSIS — R202 Paresthesia of skin: Secondary | ICD-10-CM

## 2020-05-26 DIAGNOSIS — D649 Anemia, unspecified: Secondary | ICD-10-CM

## 2020-05-26 DIAGNOSIS — I712 Thoracic aortic aneurysm, without rupture, unspecified: Secondary | ICD-10-CM

## 2020-05-26 DIAGNOSIS — N1832 Chronic kidney disease, stage 3b: Secondary | ICD-10-CM | POA: Diagnosis not present

## 2020-05-26 DIAGNOSIS — M19042 Primary osteoarthritis, left hand: Secondary | ICD-10-CM | POA: Diagnosis not present

## 2020-05-26 DIAGNOSIS — N1831 Chronic kidney disease, stage 3a: Secondary | ICD-10-CM | POA: Insufficient documentation

## 2020-05-26 DIAGNOSIS — M48062 Spinal stenosis, lumbar region with neurogenic claudication: Secondary | ICD-10-CM | POA: Diagnosis not present

## 2020-05-26 DIAGNOSIS — E1142 Type 2 diabetes mellitus with diabetic polyneuropathy: Secondary | ICD-10-CM

## 2020-05-26 DIAGNOSIS — I25708 Atherosclerosis of coronary artery bypass graft(s), unspecified, with other forms of angina pectoris: Secondary | ICD-10-CM

## 2020-05-26 DIAGNOSIS — E119 Type 2 diabetes mellitus without complications: Secondary | ICD-10-CM

## 2020-05-26 DIAGNOSIS — Z794 Long term (current) use of insulin: Secondary | ICD-10-CM

## 2020-05-26 LAB — CBC WITH DIFFERENTIAL/PLATELET
Basophils Absolute: 0.1 10*3/uL (ref 0.0–0.1)
Basophils Relative: 0.8 % (ref 0.0–3.0)
Eosinophils Absolute: 0.2 10*3/uL (ref 0.0–0.7)
Eosinophils Relative: 1.6 % (ref 0.0–5.0)
HCT: 33.5 % — ABNORMAL LOW (ref 39.0–52.0)
Hemoglobin: 10.6 g/dL — ABNORMAL LOW (ref 13.0–17.0)
Lymphocytes Relative: 6.3 % — ABNORMAL LOW (ref 12.0–46.0)
Lymphs Abs: 0.9 10*3/uL (ref 0.7–4.0)
MCHC: 31.7 g/dL (ref 30.0–36.0)
MCV: 73 fl — ABNORMAL LOW (ref 78.0–100.0)
Monocytes Absolute: 1 10*3/uL (ref 0.1–1.0)
Monocytes Relative: 6.8 % (ref 3.0–12.0)
Neutro Abs: 12 10*3/uL — ABNORMAL HIGH (ref 1.4–7.7)
Neutrophils Relative %: 84.5 % — ABNORMAL HIGH (ref 43.0–77.0)
Platelets: 272 10*3/uL (ref 150.0–400.0)
RBC: 4.59 Mil/uL (ref 4.22–5.81)
RDW: 17.9 % — ABNORMAL HIGH (ref 11.5–15.5)
WBC: 14.2 10*3/uL — ABNORMAL HIGH (ref 4.0–10.5)

## 2020-05-26 LAB — RENAL FUNCTION PANEL
Albumin: 4.4 g/dL (ref 3.5–5.2)
BUN: 26 mg/dL — ABNORMAL HIGH (ref 6–23)
CO2: 28 mEq/L (ref 19–32)
Calcium: 9.5 mg/dL (ref 8.4–10.5)
Chloride: 103 mEq/L (ref 96–112)
Creatinine, Ser: 1.32 mg/dL (ref 0.40–1.50)
GFR: 47.65 mL/min — ABNORMAL LOW (ref >60.00)
Glucose, Bld: 204 mg/dL — ABNORMAL HIGH (ref 70–99)
Phosphorus: 3.8 mg/dL (ref 2.3–4.6)
Potassium: 4.1 mEq/L (ref 3.5–5.1)
Sodium: 140 mEq/L (ref 135–145)

## 2020-05-26 LAB — HEMOGLOBIN A1C: Hgb A1c MFr Bld: 8.1 % — ABNORMAL HIGH (ref 4.6–6.5)

## 2020-05-26 LAB — B12 AND FOLATE PANEL
Folate: 15.5 ng/mL (ref >5.9)
Vitamin B-12: 470 pg/mL (ref 211–911)

## 2020-05-26 MED ORDER — HYDROCODONE-ACETAMINOPHEN 5-325 MG PO TABS: 1.0000 | ORAL_TABLET | Freq: Two times a day (BID) | ORAL | 0 refills | Status: DC | PRN

## 2020-05-26 NOTE — Progress Notes (Signed)
Subjective  CC:  Chief Complaint  Patient presents with  . Diabetes    [facility]2 [facility]   . Heart Problem    3 weeks ago called 911 due to left arm pain and cold sweats. Was not having heart attack but f/u with Dr. Martinique who has ordered an echo.     HPI: [name] is a 87 y.o. male who presents to the office today for follow up of diabetes and problems listed above in the chief complaint.   Diabetes follow up: His diabetic control is reported as mildly worse.[name]  He had a large birthday celebration for his 57th.  Diet has been a little irregular.  Most recent A1c in our office in January was good at 7.5 however he had a more recent reading with the [place] with A1c of 8.2.  He feels fine. He denies exertional CP or SOB or symptomatic hypoglycemia. He denies foot sores.  Coronary disease: Had history of left-sided chest pain.  Reviewed EMS EKG and follow-up cardiology visit.  No further episodes of chest pain.  Chronic anemia: He was told by the [facility] that he has iron deficiency anemia and should get colonoscopy and endoscopy.  He denies melena or any GI symptoms.  He has had a chronic anemia by chart review.  He also has mild chronic kidney disease.  He does not take iron therapy.  He is on long-term anticoagulation for chronic A. fib.  He also takes a PPI for chronic GERD.  GERD is not currently active.  Continues to have right-sided radicular back pain that limits activity at times.  He will be due to see neurosurgery in May for steroid injection.  This was helpful last time he had 6 months ago.  No new symptoms.  Complains of right thumb pain, worse after using cane.  History of thoracic aortic aneurysm which is stable by surveillance imaging test.  Reviewed cardiology notes.   Wt Readings from Last 3 Encounters:  05/26/20 207 lb (93.9 kg)  05/14/20 213 lb 6.4 oz (96.8 kg)  03/05/20 211 lb 12.8 oz (96.1 kg)    BP Readings from Last 3 Encounters:  05/26/20 124/72  05/14/20  130/73  04/02/20 130/78    Assessment  1. Diabetes mellitus type 2, insulin dependent ([place])   2. Diabetic peripheral neuropathy associated with type 2 diabetes mellitus ([place])   3. Spinal stenosis of lumbar region with neurogenic claudication   4. Essential (primary) hypertension   5. Thoracic aortic aneurysm without rupture (HCC) Chronic  6. Coronary artery disease of bypass graft of native heart with stable angina pectoris (HCC) Chronic  7. Stage 3b chronic kidney disease ([place])   8. Anemia, unspecified type   9. Paresthesia of skin       Plan   Diabetes is currently adequately controlled.  Goal A1c is 8.0 or less.  We will recheck today and work on diet.  No change in medications today.  Low back pain with peripheral neuropathy: We will reorder pain medication to be used as needed.  He will follow-up with neurosurgery.  Continue gabapentin 300 3 times daily.  Hypertension is well controlled  Coronary disease: Stable.  Cardiology did not think this was ischemic in nature.  Will monitor.  Recheck kidney disease and anemia.  Most likely has anemia of chronic disease.  Could consider daily iron therapy.  Will check stool cards.  He is on long-term anticoagulation and has GERD.  Would defer colonoscopy and  endoscopy given age and comorbidities unless needed.  Patient understands and agrees.  Medical decision making: Extensive lab work reviewed from [facility], reviewed cardiology notes and cardiac testing and EKG.  Anemia with recommendations for invasive procedures that could be life-threatening given age and comorbidities discussed.  Total time of encounter with chart review, face-to-face evaluation and charting was 50 minutes.  High risk patient.  Plan discussed above. Follow up: 3 months for recheck. Orders Placed This Encounter  Procedures  . B12 and Folate Panel  . CBC with Differential/Platelet  . Iron, TIBC and Ferritin Panel  . Renal function panel  . Hemoglobin A1c   No  orders of the defined types were placed in this encounter.     Immunization History  Administered Date(s) Administered  . Fluad Quad(high Dose 65+) 11/02/2018, 11/30/2019  . Influenza Split 11/14/2007, 01/01/2009, 12/23/2009  . Influenza, High Dose Seasonal PF 11/02/2012, 12/12/2013, 11/15/2014, 11/24/2017  . Influenza, Seasonal, Injecte, Preservative Fre 11/18/2015  . Influenza,trivalent, recombinat, inj, PF 12/07/2010  . Influenza-Unspecified 10/28/2011, 11/30/2014, 02/15/2017  . PFIZER(Purple Top)SARS-COV-2 Vaccination 03/23/2019, 04/14/2019, 12/06/2019  . Pneumococcal Conjugate-13 12/13/2013  . Pneumococcal-Unspecified 08/20/2008  . Td 05/29/2002  . Tdap 10/28/2011, 02/15/2017  . Zoster 08/20/2008  . Zoster Recombinat (Shingrix) 10/13/2016, 12/13/2016    Diabetes Related Lab Review: Lab Results  Component Value Date   HGBA1C 7.5 (A) 03/05/2020   HGBA1C 8.4 11/21/2019   HGBA1C 8.0 (A) 08/13/2019    Lab Results  Component Value Date   MICROALBUR <0.7 03/14/2019   Lab Results  Component Value Date   CREATININE 1.28 03/05/2020   BUN 25 (H) 03/05/2020   NA 140 03/05/2020   K 4.2 03/05/2020   CL 103 03/05/2020   CO2 29 03/05/2020   Lab Results  Component Value Date   CHOL 147 08/13/2019   CHOL 143 10/04/2018   CHOL 146 12/07/2017   Lab Results  Component Value Date   HDL 49.70 08/13/2019   HDL 44.60 10/04/2018   HDL 44.70 12/07/2017   Lab Results  Component Value Date   LDLCALC 79 08/13/2019   LDLCALC 77 10/04/2018   [place] 85 12/07/2017   Lab Results  Component Value Date   TRIG 93.0 08/13/2019   TRIG 106.0 10/04/2018   TRIG 78.0 12/07/2017   Lab Results  Component Value Date   CHOLHDL 3 08/13/2019   CHOLHDL 3 10/04/2018   CHOLHDL 3 12/07/2017   No results found for: LDLDIRECT The ASCVD Risk score Mikey Bussing DC Jr., et al., 2013) failed to calculate for the following reasons:   The 2013 ASCVD risk score is only valid for ages 47 to 64   The patient  has a prior MI or stroke diagnosis I have reviewed the PMH, Fam and Soc history. Patient Active Problem List   Diagnosis Date Noted  . Aortic stenosis, moderate 05/22/2019    Priority: High  . Chronic diastolic CHF (congestive heart failure) ([place]) 03/14/2019    Priority: High  . Spinal stenosis of lumbar region 02/21/2018    Priority: High  . Diabetic peripheral neuropathy associated with type 2 diabetes mellitus ([place]) 05/05/2017    Priority: High    No pain   . Atherosclerotic heart disease of native coronary artery without angina pectoris 06/13/2016    Priority: High    Overview:  Overview:  RCA DES placed Nov 2016, LIMA-LAD presumed patent based on Myoview but no visualized, SVG-Dx occluded  Last Assessment & Plan:  RCA DES placed Nov 2016, LIMA-LAD  presumed patent based on Myoview but no visualized, SVG-Dx occluded   . CVA (cerebral vascular accident) ([place]) 06/06/2016    Priority: High  . Recurrent coronary arteriosclerosis after percutaneous transluminal coronary angioplasty 04/18/2015    Priority: High    Overview:  Overview:  RCA DES placed Nov 2016, LIMA-LAD presumed patent based on Myoview but no visualized, SVG-Dx occluded  Last Assessment & Plan:  RCA DES placed Nov 2016, LIMA-LAD presumed patent based on Myoview but no visualized, SVG-Dx occluded   . CAD S/P PCI- Nov 2016     Priority: High    RCA DES placed Nov 2016, LIMA-LAD presumed patent based on Myoview but no visualized, SVG-Dx occluded   . Current use of long term anticoagulation 02/06/2013    Priority: High    Overview:  Monitored by cardiology   . Chronic atrial fibrillation ([place]) 07/30/2011    Priority: High    CHADs VASc=5 for age, HTN, vascular disease, and DM   . Hx of CABG x 2 1990     Priority: High    Status post CABG x2 in 1990 including an LIMA graft to the LAD, and a vein graft to the diagonal.    . Essential (primary) hypertension     Priority: High  . Diabetes mellitus type 2,  insulin dependent (HCC)     Priority: High    Neg urine microalbuminuria, not on ace.  On farxiga   . Mixed hyperlipidemia     Priority: High  . Thoracic aortic aneurysm without rupture ([place]) 05/26/2020    Priority: Medium    Followed by cards. Stable by CT 2022   . Degeneration of lumbar intervertebral disc 02/21/2018    Priority: Medium  . Chronic vertigo 03/15/2014    Priority: Medium  . Obesity (BMI 30.0-34.9) 01/31/2012    Priority: Medium  . Enlarged prostate without lower urinary tract symptoms (luts) 01/04/2011    Priority: Medium    Overview:  Urology - avodart and tamuloscin   . Gastro-esophageal reflux disease without esophagitis 12/07/2010    Priority: Medium  . Epistaxis, recurrent 04/11/2015    Priority: Low  . Osteoarthritis, knee 01/31/2012    Priority: Low  . Allergic rhinitis 10/28/2011    Priority: Low  . Hearing loss 06/08/2011    Priority: Low  . Primary osteoarthritis of hand 06/08/2011    Priority: Low  . Hematuria 08/27/2008    Priority: Low    Overview:  Essential Hematuria - urology - Grapey  w/u benign  10/1 IMO update Overview:  Overview:  Essential Hematuria - urology - Grapey  w/u benign   . Stage 3b chronic kidney disease ([place]) 05/26/2020  . Traumatic avulsion of nail plate of finger 79/04/4095  . Nail, injury by, initial encounter 03/14/2020  . 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 Cystoid macular edema of right eye 07/18/2019  . Retinal microaneurysm of right eye 07/18/2019  . Macular pucker, right eye 07/18/2019  . Lower extremity edema 03/14/2019    Social History: Patient  reports that he quit smoking about 62 years ago. His smoking use included cigarettes. He has a 30.00 pack-year smoking history. He has never used smokeless tobacco. He reports that he does not drink alcohol and does not use drugs.  Review of Systems: Ophthalmic: negative for eye pain, loss of vision or double  vision Cardiovascular: negative for chest pain Respiratory: negative for SOB or persistent cough Gastrointestinal: negative for abdominal pain Genitourinary: negative for dysuria or  gross hematuria MSK: negative for foot lesions Neurologic: negative for weakness or gait disturbance  Objective  Vitals: BP 124/72   Pulse 78   Temp 98.8 F (37.1 C) (Temporal)   Wt 207 lb (93.9 kg)   SpO2 93%   BMI 37.86 kg/m  General: well appearing, no acute distress, looks good Psych:  Alert and oriented, normal mood and affect Cardiovascular: Irregularly irregular, no edema Gastrointestinal: normal BS, soft, nontender   Diabetic education: ongoing education regarding chronic disease management for diabetes was given today. We continue to reinforce the ABC's of diabetic management: A1c (<7 or 8 dependent upon patient), tight blood pressure control, and cholesterol management with goal LDL < 100 minimally. We discuss diet strategies, exercise recommendations, medication options and possible side effects. At each visit, we review recommended immunizations and preventive care recommendations for diabetics and stress that good diabetic control can prevent other problems. See below for this patient's data.    Commons side effects, risks, benefits, and alternatives for medications and treatment plan prescribed today were discussed, and the patient expressed understanding of the given instructions. Patient is instructed to call or message via MyChart if he/she has any questions or concerns regarding our treatment plan. No barriers to understanding were identified. We discussed Red Flag symptoms and signs in detail. Patient expressed understanding regarding what to do in case of urgent or emergency type symptoms.   Medication list was reconciled, printed and provided to the patient in AVS. Patient instructions and summary information was reviewed with the patient as documented in the AVS. This note was prepared  with assistance of Dragon voice recognition software. Occasional wrong-word or sound-a-like substitutions may have occurred due to the inherent limitations of voice recognition software  This visit occurred during the SARS-CoV-2 public health emergency.  Safety protocols were in place, including screening questions prior to the visit, additional usage of staff PPE, and extensive cleaning of exam room while observing appropriate contact time as indicated for disinfecting solutions.

## 2020-05-26 NOTE — Patient Instructions (Signed)
Please return in 3 months for recheck.   Please send in the stool cards for testing.  Watch your diet now that your birthday celebration is over to get your diabetes a little better again.   If you have any questions or concerns, please don't hesitate to send me a message via MyChart or call the office at [phone]. Thank you for visiting with [place] today! It's our pleasure caring for you.

## 2020-05-27 ENCOUNTER — Other Ambulatory Visit (INDEPENDENT_AMBULATORY_CARE_PROVIDER_SITE_OTHER): Payer: Medicare Other

## 2020-05-27 DIAGNOSIS — D649 Anemia, unspecified: Secondary | ICD-10-CM | POA: Diagnosis not present

## 2020-05-27 LAB — IRON,TIBC AND FERRITIN PANEL
%SAT: 7 % (calc) — ABNORMAL LOW (ref 20–48)
Ferritin: 47 ng/mL (ref 24–380)
Iron: 27 ug/dL — ABNORMAL LOW (ref 50–180)
TIBC: 382 mcg/dL (calc) (ref 250–425)

## 2020-05-28 LAB — FECAL OCCULT BLOOD, IMMUNOCHEMICAL: Fecal Occult Bld: NEGATIVE

## 2020-05-28 NOTE — Progress Notes (Signed)
Pt notified; guiac neg stool. Anemia of chronic disease and CKD. Add iron and recheck 3 months. Hold off on GI referral given age and comorbidities. Diabetes is fairly well controlled

## 2020-05-30 ENCOUNTER — Other Ambulatory Visit: Payer: Self-pay | Admitting: Family Medicine

## 2020-06-04 ENCOUNTER — Other Ambulatory Visit: Payer: Self-pay | Admitting: Cardiology

## 2020-06-05 ENCOUNTER — Ambulatory Visit: Payer: Medicare Other | Admitting: Family Medicine

## 2020-06-10 ENCOUNTER — Other Ambulatory Visit: Payer: Self-pay

## 2020-06-10 ENCOUNTER — Ambulatory Visit (HOSPITAL_COMMUNITY): Payer: Medicare Other | Attending: Internal Medicine

## 2020-06-10 DIAGNOSIS — I350 Nonrheumatic aortic (valve) stenosis: Secondary | ICD-10-CM | POA: Insufficient documentation

## 2020-06-10 DIAGNOSIS — I482 Chronic atrial fibrillation, unspecified: Secondary | ICD-10-CM | POA: Insufficient documentation

## 2020-06-10 DIAGNOSIS — I10 Essential (primary) hypertension: Secondary | ICD-10-CM | POA: Insufficient documentation

## 2020-06-10 DIAGNOSIS — I5032 Chronic diastolic (congestive) heart failure: Secondary | ICD-10-CM | POA: Insufficient documentation

## 2020-06-10 DIAGNOSIS — E7800 Pure hypercholesterolemia, unspecified: Secondary | ICD-10-CM | POA: Diagnosis not present

## 2020-06-10 LAB — ECHOCARDIOGRAM COMPLETE
AR max vel: 1.47 cm2
AV Area VTI: 1.47 cm2
AV Area mean vel: 1.57 cm2
AV Mean grad: 18 mmHg
AV Peak grad: 30.9 mmHg
Ao pk vel: 2.78 m/s
Area-P 1/2: 2.82 cm2
P 1/2 time: 523 msec
S' Lateral: 3 cm

## 2020-06-18 ENCOUNTER — Encounter: Payer: Self-pay | Admitting: Podiatry

## 2020-06-18 ENCOUNTER — Ambulatory Visit (INDEPENDENT_AMBULATORY_CARE_PROVIDER_SITE_OTHER): Payer: Medicare Other | Admitting: Podiatry

## 2020-06-18 ENCOUNTER — Other Ambulatory Visit: Payer: Self-pay

## 2020-06-18 DIAGNOSIS — M79674 Pain in right toe(s): Secondary | ICD-10-CM | POA: Diagnosis not present

## 2020-06-18 DIAGNOSIS — E113392 Type 2 diabetes mellitus with moderate nonproliferative diabetic retinopathy without macular edema, left eye: Secondary | ICD-10-CM | POA: Insufficient documentation

## 2020-06-18 DIAGNOSIS — B351 Tinea unguium: Secondary | ICD-10-CM | POA: Diagnosis not present

## 2020-06-18 DIAGNOSIS — E11319 Type 2 diabetes mellitus with unspecified diabetic retinopathy without macular edema: Secondary | ICD-10-CM | POA: Insufficient documentation

## 2020-06-18 DIAGNOSIS — D689 Coagulation defect, unspecified: Secondary | ICD-10-CM

## 2020-06-18 DIAGNOSIS — E1142 Type 2 diabetes mellitus with diabetic polyneuropathy: Secondary | ICD-10-CM

## 2020-06-18 NOTE — Progress Notes (Signed)
This patient returns to my office for at risk foot care.  This patient requires this care by a professional since this patient will be at risk due to having diabetic neuropathy and coagulation defect.  Patient is taking eliquiss.  Patient says toenails are painful walking and wearing his shoes.  This patient presents for at risk foot care today.  General Appearance  Alert, conversant and in no acute stress.  Vascular  Dorsalis pedis and posterior tibial  pulses are weakly  palpable  bilaterally.  Capillary return is within normal limits  bilaterally. Temperature is within normal limits  bilaterally.  Neurologic  Senn-Weinstein monofilament wire test within normal limits  bilaterally. Muscle power within normal limits bilaterally.  Nails Thick disfigured discolored nails with subungual debris  from hallux to fifth toes bilaterally. No evidence of bacterial infection or drainage bilaterally.  Orthopedic  No limitations of motion  feet .  No crepitus or effusions noted.  No bony pathology or digital deformities noted.  Skin  normotropic skin with no porokeratosis noted bilaterally.  No signs of infections or ulcers noted.     Onychomycosis  B/L  Consent was obtained for treatment procedures.   Debridement of nails with nail nipper followed by dremel tool. Filed with dremel without incident.    Return office visit    10 weeks                Told patient to return for periodic foot care and evaluation due to potential at risk complications.   Gardiner Barefoot DPM

## 2020-06-20 ENCOUNTER — Telehealth: Payer: Self-pay

## 2020-06-20 NOTE — Chronic Care Management (AMB) (Signed)
    Chronic Care Management Pharmacy Assistant   Name: [name]  MRN: [id number] DOB: 1928-08-07  Reason for Encounter: General Adherence Call   Recent office visits:  05/26/20- Jeffrey Chang, MD- chronic conditions addressed, follow up 3 months  04/02/20- Jeffrey Chang, MD- avulsion fingertip follow up visit 03/05/20- Jeffrey Chang, MD- chronic conditions addressed, follow up 3 months   Recent consult visits:  No visits noted  Hospital visits:  None in previous 6 months  Medications: Outpatient Encounter Medications as of 06/20/2020  Medication Sig Note  . amoxicillin (AMOXIL) 500 MG capsule Take 4 caps 1 hour prior to dental procedure.   [name] apixaban (ELIQUIS) 5 MG TABS tablet Take 1 tablet (5 mg total) by mouth 2 (two) times daily.   [name] atorvastatin (LIPITOR) 40 MG tablet Take 1 tablet (40 mg total) by mouth daily at 6 PM.   . AVODART 0.5 MG capsule Take 0.5 mg by mouth every Monday, Wednesday, and Friday.    . Cholecalciferol 50 MCG (2000 UT) TABS Take 1 tablet by mouth daily.   [name] diltiazem (CARDIZEM CD) 360 MG 24 hr capsule Take 1 capsule (360 mg total) by mouth daily.   [name] diltiazem (TIAZAC) 360 MG 24 hr capsule Take 1 capsule by mouth daily.   . empagliflozin (JARDIANCE) 10 MG TABS tablet Take 10 mg by mouth daily.   . ferrous sulfate 325 (65 FE) MG tablet Take 1 tablet by mouth 2 (two) times daily.   . fluticasone (FLONASE) 50 MCG/ACT nasal spray SHAKE LIQUID AND USE 1 SPRAY IN EACH NOSTRIL DAILY   . furosemide (LASIX) 40 MG tablet Take 1 tablet (40 mg total) by mouth daily.   [name] gabapentin (NEURONTIN) 300 MG capsule TAKE 1 CAPSULE(300 MG) BY MOUTH THREE TIMES DAILY   . HYDROcodone-acetaminophen (NORCO/VICODIN) 5-325 MG tablet Take 1 tablet by mouth 2 (two) times daily as needed for moderate pain.   [name] insulin glargine (LANTUS) 100 UNIT/ML injection Inject 40 Units into the skin every morning.   . metFORMIN (GLUCOPHAGE) 500 MG tablet Take 500 mg by mouth 2 (two) times daily  with a meal.   . metoprolol succinate (TOPROL-XL) 100 MG 24 hr tablet TAKE 1 TABLET BY MOUTH TWICE DAILY   . nitroGLYCERIN (NITROSTAT) 0.4 MG SL tablet Place 0.4 mg under the tongue every 5 (five) minutes as needed for chest pain (x 3 doses). 02/15/2020: As needed  . pantoprazole (PROTONIX) 40 MG tablet TAKE 1 TABLET BY MOUTH DAILY AT NOON   . potassium chloride SA (KLOR-CON) 20 MEQ tablet Take 1 tablet every other day   . PRECISION XTRA TEST STRIPS test strip USE TO TEST BLOOD SUGAR 1 TO 2 TIMES DAILY   . tamsulosin (FLOMAX) 0.4 MG CAPS capsule Take 0.4 mg by mouth every Tuesday, Thursday, Saturday, and Sunday.    No facility-administered encounter medications on file as of 06/20/2020.   Patient called me and we spoke briefly of his general well being. [name] states that he has been doing well with everything. He has no issues with his current medications and fills them all in a timely manner. He keeps a record of his BS at his providers request. His fbs are 85-90,110 and 180-190 after meals. We will reconnect 05/20 to check in and schedule a visit with the CPP.   Star Rating Drugs: No star rating drugs  [name], CMA

## 2020-06-21 DIAGNOSIS — Z23 Encounter for immunization: Secondary | ICD-10-CM | POA: Diagnosis not present

## 2020-06-25 ENCOUNTER — Ambulatory Visit (INDEPENDENT_AMBULATORY_CARE_PROVIDER_SITE_OTHER): Payer: Medicare Other | Admitting: Family Medicine

## 2020-06-25 ENCOUNTER — Encounter: Payer: Self-pay | Admitting: Family Medicine

## 2020-06-25 ENCOUNTER — Other Ambulatory Visit: Payer: Self-pay

## 2020-06-25 VITALS — BP 126/72 | HR 91 | Temp 97.9°F | Wt 209.8 lb

## 2020-06-25 DIAGNOSIS — Z7901 Long term (current) use of anticoagulants: Secondary | ICD-10-CM | POA: Diagnosis not present

## 2020-06-25 DIAGNOSIS — N3001 Acute cystitis with hematuria: Secondary | ICD-10-CM

## 2020-06-25 DIAGNOSIS — R319 Hematuria, unspecified: Secondary | ICD-10-CM | POA: Diagnosis not present

## 2020-06-25 LAB — POCT URINALYSIS DIPSTICK
Bilirubin, UA: NEGATIVE
Blood, UA: POSITIVE
Glucose, UA: POSITIVE — AB
Ketones, UA: NEGATIVE
Nitrite, UA: NEGATIVE
Protein, UA: NEGATIVE
Spec Grav, UA: 1.015 (ref 1.010–1.025)
Urobilinogen, UA: 0.2 E.U./dL
pH, UA: 5.5 (ref 5.0–8.0)

## 2020-06-25 LAB — URINALYSIS, ROUTINE W REFLEX MICROSCOPIC
Bilirubin Urine: NEGATIVE
Ketones, ur: NEGATIVE
Nitrite: NEGATIVE
Specific Gravity, Urine: <=1.005 — AB (ref 1.000–1.030)
Total Protein, Urine: NEGATIVE
Urine Glucose: >=1000 — AB
Urobilinogen, UA: 0.2 (ref 0.0–1.0)
pH: 5 (ref 5.0–8.0)

## 2020-06-25 MED ORDER — AMOXICILLIN 875 MG PO TABS: 875.0000 mg | ORAL_TABLET | Freq: Two times a day (BID) | ORAL | 0 refills | Status: DC

## 2020-06-25 NOTE — Patient Instructions (Signed)
Please follow up if symptoms do not improve or as needed.   Hemorrhagic Cystitis Hemorrhagic cystitis is bleeding from damage to the inner lining of the bladder. This condition results when the inner lining of the bladder (transitional epithelium) is damaged along with the blood vessels that supply the area. Hemorrhagic cystitis may make it difficult or painful to pass urine. What are the causes? This condition may be caused by:  Damage from certain cancer treatments. This is the most common cause. It can result from: ? Radiation treatment that involves the bladder. ? Chemotherapy drugs used to treat certain cancers or to treat people who have a bone marrow transplant (cyclophosphamide and ifosfamide).  Infections with bacteria or viruses, especially in people with a weak body defense system (immune system). Rare causes of the condition include:  Other drugs, including penicillin drugs and a type of steroid (danazol).  Exposure to toxic chemicals used in dyes, markers, shoe polish, or pesticides. What are the signs or symptoms? The main sign of this condition is blood in the urine (hematuria). This can range from very mild to severe.  Mild hematuria can include microscopic bleeding that does not change the color of your urine.  Severe hematuria can cause you to have urine with bright red blood or blood clots in it. In some cases, severe hematuria can cause large clots that fill the bladder and block urine flow (urinary obstruction). Hemorrhagic cystitis may also cause symptoms such as:  An urgent or frequent need to pass urine.  Pain when passing urine.  Lower belly pain and fullness.  Urinary obstruction. How is this diagnosed? This condition may be diagnosed based on:  Your symptoms and medical history.  A physical exam.  Tests, such as: ? Urine tests to check for blood or signs of infection. ? Blood tests to check for signs of infection and a low red blood cell count due  to bleeding. ? Imaging tests of the bladder, such as ultrasound, CT scan, or MRI. ? A procedure to examine the inside of your bladder using a flexible scope (cystoscopy). How is this treated? Treatment depends on the cause of the condition and how severe the bleeding is. If you are being treated with chemotherapy drugs, you may be given other medicines to reduce the risk for this condition during treatment. Other treatments may include:  Removing your exposure to substances that are causing the condition, such as a chemical toxin or a medicine.  Taking antibiotic or antiviral medicine if the condition is caused by an infection. You may also be treated for hematuria. This can include:  Fluids (hydration), bed rest, and observation. These methods may be all that is needed if you are able to pass urine and have no blood clots.  Placing a flexible tube (catheter) into the bladder to continuously flush out (irrigate) the bladder with sterile saline solution. This may be needed if clots are passing or if bleeding is continuing.  Medicine to reduce bleeding.  A cystoscopy to remove clots if clots are filling the bladder. This may also include a procedure to stop bleeding (coagulation).  A transfusion to replace blood loss. You may need surgery to stop bleeding or to remove the bladder if other treatments have not helped. Follow these instructions at home:  If you had surgery, your health care provider will give you instructions for taking care of yourself at home after your procedure. Follow these instructions carefully.  Take over-the-counter and prescription medicines only as told by  your health care provider.  If you were prescribed an antibiotic medicine, take it as told by your health care provider. Do not stop using the antibiotic even if you start to feel better.  Return to your normal activities as told by your health care provider. Ask your health care provider what activities are safe  for you.  Drink enough fluid to keep your urine pale yellow.  Keep all follow-up visits as told by your health care provider. This is important.   Contact a health care provider if you have:  Chills or a fever.  Blood in your urine.  An urgent or frequent need to pass urine.  Pain when passing urine. Get help right away if you:  Have bright red blood or clots in your urine.  Are unable to pass urine. Summary  Hemorrhagic cystitis is bleeding caused by damage to the inner lining of your bladder.  Hemorrhagic cystitis may make it difficult or painful to pass urine.  This condition may be caused by damage from infections, radiation therapy, or chemotherapy drugs.  Blood in the urine (hematuria) is the main sign of hemorrhagic cystitis. Hematuria can be very mild and involve microscopic bleeding that does not change the color of urine. It can also be severe and include passing urine with bright red blood or blood clots in it.  Treatment for hemorrhagic cystitis depends on the cause and severity of the condition. In most cases, the condition will clear up with supportive care that may include rest, fluids, and antibiotics, along with removing exposure to the cause. Other treatments may be needed in more serious cases. This information is not intended to replace advice given to you by your health care provider. Make sure you discuss any questions you have with your health care provider. Document Revised: 10/13/2017 Document Reviewed: 10/13/2017 Elsevier Patient Education  [place].

## 2020-06-25 NOTE — Progress Notes (Signed)
Subjective  CC:  Chief Complaint  Patient presents with  . Hematuria    Started Friday, frequency 7-8 times daily, lower right side back pain     HPI: [name] is a 87 y.o. male who presents to the office today to address the problems listed above in the chief complaint.  87 year old male on oral long-term anticoagulation for chronic A. fib presents due to gross hematuria.  Reports urgency on Friday afternoon with bright red blood in the bowl.  He had 3 more episodes over the course of that evening.  He reports that with each episode of voiding, the urine color was lighter, going from pink to normal yellow.  He had 1 acute episode of right-sided back pain that lasted moments.  He denies urgency, change in flow, urinary hesitancy, dysuria, penile discharge.  He has had no more episodes of gross hematuria or back pain.  No fevers, chills, suprapubic pain.  He feels well.  He has had some increased frequency of urination over the last several days.  He has a history of benign hematuria with negative work-up several years ago.  He has not had a kidney infection.   Assessment  1. Acute hemorrhagic cystitis   2. Hematuria, unspecified type   3. Current use of long term anticoagulation      Plan   Cystitis with hematuria: check urine micro and culture. Treat with amoxicillin x7 days.  Education given.  No red flag symptoms.  Follow-up if worsens, develops pain or fever.  Continue anticoagulation for now.  Follow up: As scheduled 07/29/2020  Orders Placed This Encounter  Procedures  . Urine Culture  . Urinalysis, Routine w reflex microscopic  . POCT Urinalysis Dipstick   Meds ordered this encounter  Medications  . amoxicillin (AMOXIL) 875 MG tablet    Sig: Take 1 tablet (875 mg total) by mouth 2 (two) times daily for 7 days.    Dispense:  14 tablet    Refill:  0      I reviewed the patients updated PMH, FH, and SocHx.    Patient Active Problem List   Diagnosis Date Noted   . Diabetic retinopathy associated with type 2 diabetes mellitus ([place] 06/18/2020    Priority: High  . Aortic stenosis, moderate 05/22/2019    Priority: High  . Chronic diastolic CHF (congestive heart failure) ([place]) 03/14/2019    Priority: High  . Spinal stenosis of lumbar region 02/21/2018    Priority: High  . Diabetic peripheral neuropathy associated with type 2 diabetes mellitus ([place]) 05/05/2017    Priority: High  . Atherosclerotic heart disease of native coronary artery without angina pectoris 06/13/2016    Priority: High  . CVA (cerebral vascular accident) ([place]) 06/06/2016    Priority: High  . Recurrent coronary arteriosclerosis after percutaneous transluminal coronary angioplasty 04/18/2015    Priority: High  . CAD S/P PCI- Nov 2016     Priority: High  . Current use of long term anticoagulation 02/06/2013    Priority: High  . Chronic atrial fibrillation ([name]) 07/30/2011    Priority: High  . Hx of CABG x 2 1990     Priority: High  . Essential (primary) hypertension     Priority: High  . Diabetes mellitus type 2, insulin dependent ([place])     Priority: High  . Mixed hyperlipidemia     Priority: High  . Thoracic aortic aneurysm without rupture ([place]) 05/26/2020    Priority: Medium  . Degeneration of lumbar intervertebral disc  02/21/2018    Priority: Medium  . Chronic vertigo 03/15/2014    Priority: Medium  . Obesity (BMI 30.0-34.9) 01/31/2012    Priority: Medium  . Enlarged prostate without lower urinary tract symptoms (luts) 01/04/2011    Priority: Medium  . Gastro-esophageal reflux disease without esophagitis 12/07/2010    Priority: Medium  . Epistaxis, recurrent 04/11/2015    Priority: Low  . Osteoarthritis, knee 01/31/2012    Priority: Low  . Allergic rhinitis 10/28/2011    Priority: Low  . Hearing loss 06/08/2011    Priority: Low  . Primary osteoarthritis of hand 06/08/2011    Priority: Low  . Benign hematuria 08/27/2008    Priority: Low  . Stage 3b chronic  kidney disease ([place]) 05/26/2020  . Lower extremity edema 03/14/2019   Current Meds  Medication Sig  . amoxicillin (AMOXIL) 875 MG tablet Take 1 tablet (875 mg total) by mouth 2 (two) times daily for 7 days.    Allergies: Patient is allergic to erythromycin base, cefadroxil, ciprofloxacin, and erythromycin. Family History: Patient family history includes Brain cancer in his father; Throat cancer in his brother. Social History:  Patient  reports that he quit smoking about 62 years ago. His smoking use included cigarettes. He has a 30.00 pack-year smoking history. He has never used smokeless tobacco. He reports that he does not drink alcohol and does not use drugs.  Review of Systems: Constitutional: Negative for fever malaise or anorexia Cardiovascular: negative for chest pain Respiratory: negative for SOB or persistent cough Gastrointestinal: negative for abdominal pain  Objective  Vitals: BP 126/72   Pulse 91   Temp 97.9 F (36.6 C) (Temporal)   Wt 209 lb 12.8 oz (95.2 kg)   SpO2 96%   BMI 38.37 kg/m  General: no acute distress , A&Ox3, appears well HEENT: PEERL, conjunctiva normal, neck is supple Cardiovascular: Irregularly irregular Gastrointestinal: soft, flat abdomen, normal active bowel sounds, no palpable masses, no hepatosplenomegaly, no CVA tenderness, no suprapubic tenderness Skin:  Warm, no rashes  Office Visit on 06/25/2020  Component Date Value Ref Range Status  . Color, UA 06/25/2020 yellow   Final  . Clarity, UA 06/25/2020 clear   Final  . Glucose, UA 06/25/2020 Positive* Negative Final  . Bilirubin, UA 06/25/2020 negative   Final  . Ketones, UA 06/25/2020 negative   Final  . Spec Grav, UA 06/25/2020 1.015  1.010 - 1.025 Final  . Blood, UA 06/25/2020 positive   Final  . pH, UA 06/25/2020 5.5  5.0 - 8.0 Final  . Protein, UA 06/25/2020 Negative  Negative Final  . Urobilinogen, UA 06/25/2020 0.2  0.2 or 1.0 E.U./dL Final  . Nitrite, UA 06/25/2020 negative    Final  . Leukocytes, UA 06/25/2020 Large (3+)* Negative Final     Commons side effects, risks, benefits, and alternatives for medications and treatment plan prescribed today were discussed, and the patient expressed understanding of the given instructions. Patient is instructed to call or message via MyChart if he/she has any questions or concerns regarding our treatment plan. No barriers to understanding were identified. We discussed Red Flag symptoms and signs in detail. Patient expressed understanding regarding what to do in case of urgent or emergency type symptoms.   Medication list was reconciled, printed and provided to the patient in AVS. Patient instructions and summary information was reviewed with the patient as documented in the AVS. This note was prepared with assistance of Dragon voice recognition software. Occasional wrong-word or sound-a-like substitutions may have occurred  due to the inherent limitations of voice recognition software  This visit occurred during the SARS-CoV-2 public health emergency.  Safety protocols were in place, including screening questions prior to the visit, additional usage of staff PPE, and extensive cleaning of exam room while observing appropriate contact time as indicated for disinfecting solutions.

## 2020-06-27 LAB — URINE CULTURE
MICRO NUMBER:: 11820724
SPECIMEN QUALITY:: ADEQUATE

## 2020-06-30 ENCOUNTER — Other Ambulatory Visit: Payer: Self-pay

## 2020-06-30 DIAGNOSIS — R8271 Bacteriuria: Secondary | ICD-10-CM

## 2020-06-30 MED ORDER — SULFAMETHOXAZOLE-TRIMETHOPRIM 800-160 MG PO TABS: 1.0000 | ORAL_TABLET | Freq: Two times a day (BID) | ORAL | 0 refills | Status: DC

## 2020-06-30 NOTE — Progress Notes (Signed)
Please call patient: I have reviewed his/her lab results. Urine is infected but with an organism that needs a different antibiotic. Please stop the amoxicillin and order septra DS 1 po bid x 7 days. Thanks.

## 2020-07-03 ENCOUNTER — Encounter: Payer: Self-pay | Admitting: Family Medicine

## 2020-07-03 ENCOUNTER — Ambulatory Visit (INDEPENDENT_AMBULATORY_CARE_PROVIDER_SITE_OTHER): Payer: Medicare Other | Admitting: Family Medicine

## 2020-07-03 ENCOUNTER — Ambulatory Visit (INDEPENDENT_AMBULATORY_CARE_PROVIDER_SITE_OTHER): Payer: Medicare Other

## 2020-07-03 ENCOUNTER — Telehealth: Payer: Self-pay

## 2020-07-03 VITALS — BP 111/59 | HR 90 | Temp 97.9°F | Ht 62.0 in | Wt 209.0 lb

## 2020-07-03 DIAGNOSIS — T7840XA Allergy, unspecified, initial encounter: Secondary | ICD-10-CM | POA: Diagnosis not present

## 2020-07-03 DIAGNOSIS — R0602 Shortness of breath: Secondary | ICD-10-CM | POA: Diagnosis not present

## 2020-07-03 DIAGNOSIS — N3001 Acute cystitis with hematuria: Secondary | ICD-10-CM

## 2020-07-03 LAB — POC URINALSYSI DIPSTICK (AUTOMATED)
Bilirubin, UA: NEGATIVE
Glucose, UA: POSITIVE — AB
Ketones, UA: NEGATIVE
Nitrite, UA: NEGATIVE
Protein, UA: NEGATIVE
Spec Grav, UA: 1.015 (ref 1.010–1.025)
Urobilinogen, UA: 0.2 E.U./dL
pH, UA: 5.5 (ref 5.0–8.0)

## 2020-07-03 LAB — CBC WITH DIFFERENTIAL/PLATELET
Basophils Absolute: 0.1 10*3/uL (ref 0.0–0.1)
Basophils Relative: 0.9 % (ref 0.0–3.0)
Eosinophils Absolute: 0.6 10*3/uL (ref 0.0–0.7)
Eosinophils Relative: 5.8 % — ABNORMAL HIGH (ref 0.0–5.0)
HCT: 34.9 % — ABNORMAL LOW (ref 39.0–52.0)
Hemoglobin: 11.3 g/dL — ABNORMAL LOW (ref 13.0–17.0)
Lymphocytes Relative: 7 % — ABNORMAL LOW (ref 12.0–46.0)
Lymphs Abs: 0.7 10*3/uL (ref 0.7–4.0)
MCHC: 32.3 g/dL (ref 30.0–36.0)
MCV: 78.4 fl (ref 78.0–100.0)
Monocytes Absolute: 1.1 10*3/uL — ABNORMAL HIGH (ref 0.1–1.0)
Monocytes Relative: 10.6 % (ref 3.0–12.0)
Neutro Abs: 8.1 10*3/uL — ABNORMAL HIGH (ref 1.4–7.7)
Neutrophils Relative %: 75.7 % (ref 43.0–77.0)
Platelets: 275 10*3/uL (ref 150.0–400.0)
RBC: 4.45 Mil/uL (ref 4.22–5.81)
RDW: 22.9 % — ABNORMAL HIGH (ref 11.5–15.5)
WBC: 10.7 10*3/uL — ABNORMAL HIGH (ref 4.0–10.5)

## 2020-07-03 LAB — COMPREHENSIVE METABOLIC PANEL
ALT: 13 U/L (ref 0–53)
AST: 15 U/L (ref 0–37)
Albumin: 4 g/dL (ref 3.5–5.2)
Alkaline Phosphatase: 69 U/L (ref 39–117)
BUN: 27 mg/dL — ABNORMAL HIGH (ref 6–23)
CO2: 28 mEq/L (ref 19–32)
Calcium: 9.3 mg/dL (ref 8.4–10.5)
Chloride: 104 mEq/L (ref 96–112)
Creatinine, Ser: 1.72 mg/dL — ABNORMAL HIGH (ref 0.40–1.50)
GFR: 34.66 mL/min — ABNORMAL LOW (ref >60.00)
Glucose, Bld: 138 mg/dL — ABNORMAL HIGH (ref 70–99)
Potassium: 4.7 mEq/L (ref 3.5–5.1)
Sodium: 141 mEq/L (ref 135–145)
Total Bilirubin: 0.6 mg/dL (ref 0.2–1.2)
Total Protein: 6.5 g/dL (ref 6.0–8.3)

## 2020-07-03 MED ORDER — CEFTRIAXONE SODIUM 500 MG IJ SOLR
500.0000 mg | Freq: Once | INTRAMUSCULAR | Status: AC
  Administered 2020-07-03: 500 mg via INTRAMUSCULAR

## 2020-07-03 NOTE — Telephone Encounter (Signed)
Returned patients call, spoke with patient himself. Wanted clarification that he is not on any medication for the UTI at the moment was just given an injection today. Once culture comes back from today, he would like copies of the UA and culture from this visit and previous visit mailed to him if possible.

## 2020-07-03 NOTE — Telephone Encounter (Signed)
Patient's wife is calling in wondering about the lab and urine results.

## 2020-07-03 NOTE — Progress Notes (Signed)
Subjective  CC:  Chief Complaint  Patient presents with  . Dysuria    HPI: [name] is a 87 y.o. male who presents to the office today to address the problems listed above in the chief complaint.  87 year old male with chronic anticoagulation for A. fib, aortic stenosis, hypertension, diabetes who was recently treated for an E. coli UTI, see last note.  He presents today due to feeling poorly.  He thinks he is having allergic reaction to the antibiotic.  To review, he had gross hematuria and an abnormal dipstick.  He was started on amoxicillin for a presumed UTI.  Urine culture showed E. coli resistant to amoxicillin but sensitive to Bactrim and fluoroquinolones.  He was started on Bactrim about 2 and half days ago.  He reports that he had been feeling well until 2 days ago when he started having recurrent dysuria, hand itching, swelling and redness of his palms.  He also reports that he has been more fatigued falling asleep last night during a baseball game which is highly unusual.  He and his wife deny confusion, fevers but he does report that he is dyspneic on exertion.  He has had no lower extremity edema, chest pain or palpitations.  He has no flank pain or abdominal pain.  His appetite is normal at baseline for him.  He denies hyperglycemia.  His last dose of antibiotic was last night.   Assessment  1. Acute hemorrhagic cystitis   2. Allergic reaction to drug, initial encounter   3. Shortness of breath      Plan   Cystitis and allergic reaction to Septra: He is nontoxic-appearing but running a low blood pressure.  Stop Septra.  Rocephin given in office.  Await new urine culture.  Check lab work.  Hydrate and fall prevention risk discussed.  Monitor for fever.  Change immediately for nausea, vomiting, fever.  Close follow-up.  Shortness of breath: May be related to allergic reaction.  Check chest x-ray.  Respiratory status is stable currently.  Follow up: As  scheduled 07/29/2020  Orders Placed This Encounter  Procedures  . Urine Culture  . CBC with Differential/Platelet  . Comprehensive metabolic panel  . POCT Urinalysis Dipstick (Automated)   No orders of the defined types were placed in this encounter.     I reviewed the patients updated PMH, FH, and SocHx.    Patient Active Problem List   Diagnosis Date Noted  . Diabetic retinopathy associated with type 2 diabetes mellitus ([place]) 06/18/2020    Priority: High  . Stage 3b chronic kidney disease ([place]) 05/26/2020    Priority: High  . Aortic stenosis, moderate 05/22/2019    Priority: High  . Chronic diastolic CHF (congestive heart failure) ([place]) 03/14/2019    Priority: High  . Spinal stenosis of lumbar region 02/21/2018    Priority: High  . Diabetic peripheral neuropathy associated with type 2 diabetes mellitus ([place]) 05/05/2017    Priority: High  . Atherosclerotic heart disease of native coronary artery without angina pectoris 06/13/2016    Priority: High  . CVA (cerebral vascular accident) ([place]) 06/06/2016    Priority: High  . Recurrent coronary arteriosclerosis after percutaneous transluminal coronary angioplasty 04/18/2015    Priority: High  . CAD S/P PCI- Nov 2016     Priority: High  . Current use of long term anticoagulation 02/06/2013    Priority: High  . Chronic atrial fibrillation ([place]) 07/30/2011    Priority: High  . Hx of CABG x 2  1990     Priority: High  . Essential (primary) hypertension     Priority: High  . Diabetes mellitus type 2, insulin dependent ([place])     Priority: High  . Mixed hyperlipidemia     Priority: High  . Thoracic aortic aneurysm without rupture ([place]) 05/26/2020    Priority: Medium  . Degeneration of lumbar intervertebral disc 02/21/2018    Priority: Medium  . Chronic vertigo 03/15/2014    Priority: Medium  . Obesity (BMI 30.0-34.9) 01/31/2012    Priority: Medium  . Enlarged prostate without lower urinary tract symptoms (luts) 01/04/2011     Priority: Medium  . Gastro-esophageal reflux disease without esophagitis 12/07/2010    Priority: Medium  . Epistaxis, recurrent 04/11/2015    Priority: Low  . Osteoarthritis, knee 01/31/2012    Priority: Low  . Allergic rhinitis 10/28/2011    Priority: Low  . Hearing loss 06/08/2011    Priority: Low  . Primary osteoarthritis of hand 06/08/2011    Priority: Low  . Benign hematuria 08/27/2008    Priority: Low  . Lower extremity edema 03/14/2019   Current Meds  Medication Sig  . apixaban (ELIQUIS) 5 MG TABS tablet Take 1 tablet (5 mg total) by mouth 2 (two) times daily.  [name] atorvastatin (LIPITOR) 40 MG tablet Take 1 tablet (40 mg total) by mouth daily at 6 PM.  . AVODART 0.5 MG capsule Take 0.5 mg by mouth every Monday, Wednesday, and Friday.   . Cholecalciferol 50 MCG (2000 UT) TABS Take 1 tablet by mouth daily.  [name] diltiazem (CARDIZEM CD) 360 MG 24 hr capsule Take 1 capsule (360 mg total) by mouth daily.  [name] diltiazem (TIAZAC) 360 MG 24 hr capsule Take 1 capsule by mouth daily.  . empagliflozin (JARDIANCE) 10 MG TABS tablet Take 10 mg by mouth daily.  . ferrous sulfate 325 (65 FE) MG tablet Take 1 tablet by mouth 2 (two) times daily.  . fluticasone (FLONASE) 50 MCG/ACT nasal spray SHAKE LIQUID AND USE 1 SPRAY IN EACH NOSTRIL DAILY  . furosemide (LASIX) 40 MG tablet Take 1 tablet (40 mg total) by mouth daily.  [name] gabapentin (NEURONTIN) 300 MG capsule TAKE 1 CAPSULE(300 MG) BY MOUTH THREE TIMES DAILY  . HYDROcodone-acetaminophen (NORCO/VICODIN) 5-325 MG tablet Take 1 tablet by mouth 2 (two) times daily as needed for moderate pain.  [name] insulin glargine (LANTUS) 100 UNIT/ML injection Inject 40 Units into the skin every morning.  . metFORMIN (GLUCOPHAGE) 500 MG tablet Take 500 mg by mouth 2 (two) times daily with a meal.  . metoprolol succinate (TOPROL-XL) 100 MG 24 hr tablet TAKE 1 TABLET BY MOUTH TWICE DAILY  . nitroGLYCERIN (NITROSTAT) 0.4 MG SL tablet Place 0.4 mg under the tongue  every 5 (five) minutes as needed for chest pain (x 3 doses).  . pantoprazole (PROTONIX) 40 MG tablet TAKE 1 TABLET BY MOUTH DAILY AT NOON  . potassium chloride SA (KLOR-CON) 20 MEQ tablet Take 1 tablet every other day  . PRECISION XTRA TEST STRIPS test strip USE TO TEST BLOOD SUGAR 1 TO 2 TIMES DAILY  . tamsulosin (FLOMAX) 0.4 MG CAPS capsule Take 0.4 mg by mouth every Tuesday, Thursday, Saturday, and Sunday.    Allergies: Patient is allergic to erythromycin base, cefadroxil, ciprofloxacin, and erythromycin. Family History: Patient family history includes Brain cancer in his father; Throat cancer in his brother. Social History:  Patient  reports that he quit smoking about 62 years ago. His smoking use included cigarettes. He has  a 30.00 pack-year smoking history. He has never used smokeless tobacco. He reports that he does not drink alcohol and does not use drugs.  Review of Systems: Constitutional: Negative for fever malaise or anorexia Cardiovascular: negative for chest pain Respiratory: negative for SOB or persistent cough Gastrointestinal: negative for abdominal pain  Objective  Vitals: BP (!) 111/59   Pulse 90   Temp 97.9 F (36.6 C)   Ht 5\' 2"  (1.575 m)   Wt 209 lb (94.8 kg)   SpO2 97%   BMI 38.23 kg/m  General: no acute distress , A&Ox3, nontoxic appearing HEENT: PEERL, conjunctiva normal, neck is supple Cardiovascular:  RRR with murmur Respiratory:  Good breath sounds bilaterally, CTAB with normal respiratory effort no rales or wheezing Skin:  Warm, bilateral palms are erythematous, 1 vesicle noted in the center of the right hand palm.  No other rashes present. No CVA tenderness, nontender abdomen.     Commons side effects, risks, benefits, and alternatives for medications and treatment plan prescribed today were discussed, and the patient expressed understanding of the given instructions. Patient is instructed to call or message via MyChart if he/she has any questions  or concerns regarding our treatment plan. No barriers to understanding were identified. We discussed Red Flag symptoms and signs in detail. Patient expressed understanding regarding what to do in case of urgent or emergency type symptoms.   Medication list was reconciled, printed and provided to the patient in AVS. Patient instructions and summary information was reviewed with the patient as documented in the AVS. This note was prepared with assistance of Dragon voice recognition software. Occasional wrong-word or sound-a-like substitutions may have occurred due to the inherent limitations of voice recognition software  This visit occurred during the SARS-CoV-2 public health emergency.  Safety protocols were in place, including screening questions prior to the visit, additional usage of staff PPE, and extensive cleaning of exam room while observing appropriate contact time as indicated for disinfecting solutions.

## 2020-07-04 ENCOUNTER — Telehealth: Payer: Self-pay

## 2020-07-04 LAB — URINE CULTURE
MICRO NUMBER:: 11854416
Result:: NO GROWTH
SPECIMEN QUALITY:: ADEQUATE

## 2020-07-04 MED ORDER — AMOXICILLIN-POT CLAVULANATE 875-125 MG PO TABS: 1.0000 | ORAL_TABLET | Freq: Two times a day (BID) | ORAL | 0 refills | Status: DC

## 2020-07-04 NOTE — Addendum Note (Signed)
Addended by: Billey Chang on: 07/04/2020 09:07 AM   Modules accepted: Orders

## 2020-07-04 NOTE — Telephone Encounter (Signed)
Please call patient back with results.

## 2020-07-04 NOTE — Telephone Encounter (Signed)
Spoke with patient regarding labs.

## 2020-07-04 NOTE — Progress Notes (Signed)
Please call patient: I have reviewed his/her lab results. Please call him to see how he is feeling today. Any better? Please ask him if he has noted any rash or redness on the head of the penis. I want to make sure there is no yeast infection there.  I have ordered augmentin to take until we get the urine culture back. He can pick this up from his pharmacy today.

## 2020-07-07 ENCOUNTER — Telehealth: Payer: Self-pay

## 2020-07-07 MED ORDER — HYDROCODONE-ACETAMINOPHEN 5-325 MG PO TABS: 1.0000 | ORAL_TABLET | Freq: Two times a day (BID) | ORAL | 0 refills | Status: DC | PRN

## 2020-07-07 MED ORDER — FLUCONAZOLE 150 MG PO TABS: 150.0000 mg | ORAL_TABLET | Freq: Every day | ORAL | 0 refills | Status: DC

## 2020-07-07 NOTE — Telephone Encounter (Signed)
Spoke to both patient and his wife.  Patient's main problems are persistent dysuria.  He does admit to redness and rash on the head of his penis.  Denies discharge.  Urine culture was negative.  He is on [place].  He denies hypo or hyperglycemia.  His appetite is not great but he is eating and drinking okay.  No lightheadedness upon standing.  No chest pain or palpitations.  No shortness of breath.  Unfortunately, his chronic back pain has flared with sciatica and he needs a refill on his pain medication.  No fevers or chills  No visits with results within 1 Day(s) from this visit.  Latest known visit with results is:  Office Visit on 07/03/2020  Component Date Value Ref Range Status  . Color, UA 07/03/2020 yellow   Final  . Clarity, UA 07/03/2020 cloudy   Final  . Glucose, UA 07/03/2020 Positive* Negative Final  . Bilirubin, UA 07/03/2020 negative   Final  . Ketones, UA 07/03/2020 negative   Final  . Spec Grav, UA 07/03/2020 1.015  1.010 - 1.025 Final  . Blood, UA 07/03/2020 3+   Final  . pH, UA 07/03/2020 5.5  5.0 - 8.0 Final  . Protein, UA 07/03/2020 Negative  Negative Final  . Urobilinogen, UA 07/03/2020 0.2  0.2 or 1.0 E.U./dL Final  . Nitrite, UA 07/03/2020 negative   Final  . Leukocytes, UA 07/03/2020 Large (3+)* Negative Final  . MICRO NUMBER: 07/03/2020 [id number]   Final  . SPECIMEN QUALITY: 07/03/2020 Adequate   Final  . Sample Source 07/03/2020 NOT GIVEN   Final  . STATUS: 07/03/2020 FINAL   Final  . Result: 07/03/2020 No Growth   Final  . WBC 07/03/2020 10.7* 4.0 - 10.5 K/uL Final  . RBC 07/03/2020 4.45  4.22 - 5.81 Mil/uL Final  . Hemoglobin 07/03/2020 11.3* 13.0 - 17.0 g/dL Final  . HCT 07/03/2020 34.9* 39.0 - 52.0 % Final  . MCV 07/03/2020 78.4  78.0 - 100.0 fl Final  . MCHC 07/03/2020 32.3  30.0 - 36.0 g/dL Final  . RDW 07/03/2020 22.9* 11.5 - 15.5 % Final  . Platelets 07/03/2020 275.0  150.0 - 400.0 K/uL Final  . Neutrophils Relative % 07/03/2020 75.7  43.0 - 77.0 %  Final  . Lymphocytes Relative 07/03/2020 7.0* 12.0 - 46.0 % Corrected  . Monocytes Relative 07/03/2020 10.6  3.0 - 12.0 % Final  . Eosinophils Relative 07/03/2020 5.8* 0.0 - 5.0 % Final  . Basophils Relative 07/03/2020 0.9  0.0 - 3.0 % Final  . Neutro Abs 07/03/2020 8.1* 1.4 - 7.7 K/uL Final  . Lymphs Abs 07/03/2020 0.7  0.7 - 4.0 K/uL Final  . Monocytes Absolute 07/03/2020 1.1* 0.1 - 1.0 K/uL Final  . Eosinophils Absolute 07/03/2020 0.6  0.0 - 0.7 K/uL Final  . Basophils Absolute 07/03/2020 0.1  0.0 - 0.1 K/uL Final  . Sodium 07/03/2020 141  135 - 145 mEq/L Final  . Potassium 07/03/2020 4.7  3.5 - 5.1 mEq/L Final  . Chloride 07/03/2020 104  96 - 112 mEq/L Final  . CO2 07/03/2020 28  19 - 32 mEq/L Final  . Glucose, Bld 07/03/2020 138* 70 - 99 mg/dL Final  . BUN 07/03/2020 27* 6 - 23 mg/dL Final  . Creatinine, Ser 07/03/2020 1.72* 0.40 - 1.50 mg/dL Final  . Total Bilirubin 07/03/2020 0.6  0.2 - 1.2 mg/dL Final  . Alkaline Phosphatase 07/03/2020 69  39 - 117 U/L Final  . AST 07/03/2020 15  0 -  37 U/L Final  . ALT 07/03/2020 13  0 - 53 U/L Final  . Total Protein 07/03/2020 6.5  6.0 - 8.3 g/dL Final  . Albumin 07/03/2020 4.0  3.5 - 5.2 g/dL Final  . GFR 07/03/2020 34.66* >60.00 mL/min Final  . Calcium 07/03/2020 9.3  8.4 - 10.5 mg/dL Final   Plan:   Treat for yeast balanitis with Diflucan 150 mg daily for 3 days and over-the-counter Monistat.  Stop Augmentin.  Fortunately he is recovered from his allergic reaction to Septra.  No more hand itching or swelling.  Energy level is fair.  Hold Jardiance for 2 weeks.  Back pain: Chronic.  Refill pain medicine.  He will follow-up with orthopedics or neurosurgery if persists  Allergic reaction to Septra resolved  Acute hemorrhagic cystitis: Resolved

## 2020-07-07 NOTE — Telephone Encounter (Signed)
Patients wife called in and stated that he is in so much pain that he was a "dying man" dont know what they should do. Patients would like a call back as soon as possible.

## 2020-07-07 NOTE — Telephone Encounter (Signed)
Patient has been using antibiotic since Friday, medication has not helped anything.Instructed him to stop the antibiotic. Blood is still coming out of penis when urinating,  pain in hip has not improved. Has a hard time sleeping at night, blood from penis when washing during shower. He has a very small appetite, is trying hard to stay hydrated. Patient cannot walk without walker, pain in hip and right leg goes down to ankle. Has been using the Voltaren gel, no relief. Patient prefers not to be seen in the ER. Patient wants to speak to [facility] directly if possible, would like an OV if available.

## 2020-07-07 NOTE — Telephone Encounter (Signed)
Spoke with patient, and so did Dr.Andy. Sending in a new medication for ongoing symptoms, instructed to stop antibiotic and start new medication. Was told to stop jardiance for now until finishing new medication. Will check on patient in about 3-4 days.

## 2020-07-09 ENCOUNTER — Telehealth: Payer: Self-pay | Admitting: Cardiology

## 2020-07-09 NOTE — Telephone Encounter (Signed)
Spoke to patient's wife advised unable to know who called,no notation in chart.She stated they did not leave a message.Stated husband is getting over a bad yeast infection.He is on 4 antibiotic.Advised to keep appointment as planned with Dr.Jordan 7/6 at 3:40 pm.Advised to call sooner if needed.

## 2020-07-09 NOTE — Telephone Encounter (Signed)
Patient states he is returning a call from today. I did not see any notes. He states in order for the call to go through you must dial 1 first and then [phone]

## 2020-07-14 ENCOUNTER — Ambulatory Visit: Payer: Medicare Other | Admitting: Cardiology

## 2020-07-15 ENCOUNTER — Other Ambulatory Visit: Payer: Self-pay | Admitting: Cardiology

## 2020-07-21 ENCOUNTER — Other Ambulatory Visit: Payer: Self-pay

## 2020-07-21 ENCOUNTER — Ambulatory Visit (INDEPENDENT_AMBULATORY_CARE_PROVIDER_SITE_OTHER): Payer: Medicare Other | Admitting: Ophthalmology

## 2020-07-21 ENCOUNTER — Encounter (INDEPENDENT_AMBULATORY_CARE_PROVIDER_SITE_OTHER): Payer: Self-pay | Admitting: Ophthalmology

## 2020-07-21 DIAGNOSIS — E113392 Type 2 diabetes mellitus with moderate nonproliferative diabetic retinopathy without macular edema, left eye: Secondary | ICD-10-CM

## 2020-07-21 DIAGNOSIS — H348112 Central retinal vein occlusion, right eye, stable: Secondary | ICD-10-CM | POA: Diagnosis not present

## 2020-07-21 NOTE — Assessment & Plan Note (Signed)
History of disease in the past, not active, accounts for sectorial atrophy superiorly on OCT

## 2020-07-21 NOTE — Assessment & Plan Note (Signed)
Minimal to no diabetic retinopathy.  Micro aneurysmal changes seen OD are mostly from compensated old and active CRV O with CME resolved

## 2020-07-21 NOTE — Progress Notes (Signed)
07/21/2020     CHIEF COMPLAINT Patient presents for Retina Evaluation (History of diabetic retinopathy , and also retinal vein occlusion compensated right time.) and Diabetic retinopathy (History of diabetes mellitus dilated examination required)   HISTORY OF PRESENT ILLNESS: [name] is a 87 y.o. male who presents to the clinic today for:   HPI    Retina Evaluation    Laterality: both eyes   Onset: years ago   Duration: years   Comments: History of diabetic retinopathy , and also retinal vein occlusion compensated right time.          Diabetic retinopathy    Comments: History of diabetes mellitus dilated examination required       Last edited by Hurman Horn, MD on 07/21/2020  9:06 AM. (History)      Referring physician: [name], [address],  [place] [address]  HISTORICAL INFORMATION:   Selected notes from the MEDICAL RECORD NUMBER    Lab Results  Component Value Date   HGBA1C 8.1 (H) 05/26/2020     CURRENT MEDICATIONS: No current outpatient medications on file. (Ophthalmic Drugs)   No current facility-administered medications for this visit. (Ophthalmic Drugs)   Current Outpatient Medications (Other)  Medication Sig  . amoxicillin-clavulanate (AUGMENTIN) 875-125 MG tablet Take 1 tablet by mouth 2 (two) times daily.  [name] apixaban (ELIQUIS) 5 MG TABS tablet Take 1 tablet (5 mg total) by mouth 2 (two) times daily.  [name] atorvastatin (LIPITOR) 40 MG tablet Take 1 tablet (40 mg total) by mouth daily at 6 PM.  . AVODART 0.5 MG capsule Take 0.5 mg by mouth every Monday, Wednesday, and Friday.   . Cholecalciferol 50 MCG (2000 UT) TABS Take 1 tablet by mouth daily.  [name] diltiazem (TIAZAC) 360 MG 24 hr capsule Take 1 capsule by mouth daily.  . empagliflozin (JARDIANCE) 10 MG TABS tablet Take 10 mg by mouth daily.  . ferrous sulfate 325 (65 FE) MG tablet Take 1 tablet by mouth 2 (two) times daily.  . fluconazole (DIFLUCAN) 150 MG tablet Take  1 tablet (150 mg total) by mouth daily.  . fluticasone (FLONASE) 50 MCG/ACT nasal spray SHAKE LIQUID AND USE 1 SPRAY IN EACH NOSTRIL DAILY  . furosemide (LASIX) 40 MG tablet TAKE 1 TABLET(40 MG) BY MOUTH DAILY  . gabapentin (NEURONTIN) 300 MG capsule TAKE 1 CAPSULE(300 MG) BY MOUTH THREE TIMES DAILY  . HYDROcodone-acetaminophen (NORCO/VICODIN) 5-325 MG tablet Take 1 tablet by mouth 2 (two) times daily as needed for moderate pain.  [name] insulin glargine (LANTUS) 100 UNIT/ML injection Inject 40 Units into the skin every morning.  . metFORMIN (GLUCOPHAGE) 500 MG tablet Take 500 mg by mouth 2 (two) times daily with a meal.  . metoprolol succinate (TOPROL-XL) 100 MG 24 hr tablet TAKE 1 TABLET BY MOUTH TWICE DAILY  . nitroGLYCERIN (NITROSTAT) 0.4 MG SL tablet Place 0.4 mg under the tongue every 5 (five) minutes as needed for chest pain (x 3 doses).  . pantoprazole (PROTONIX) 40 MG tablet TAKE 1 TABLET BY MOUTH DAILY AT NOON  . potassium chloride SA (KLOR-CON) 20 MEQ tablet Take 1 tablet every other day  . PRECISION XTRA TEST STRIPS test strip USE TO TEST BLOOD SUGAR 1 TO 2 TIMES DAILY  . tamsulosin (FLOMAX) 0.4 MG CAPS capsule Take 0.4 mg by mouth every Tuesday, Thursday, Saturday, and Sunday.   No current facility-administered medications for this visit. (Other)      REVIEW OF SYSTEMS:  ALLERGIES Allergies  Allergen Reactions  . Erythromycin Base   . Septra [Sulfamethoxazole-Trimethoprim] Itching  . Cefadroxil Other (See Comments)    unknown  . Ciprofloxacin Other (See Comments)    Unknown Unknown  . Erythromycin Other (See Comments)    Upset stomach Upset stomach    PAST MEDICAL HISTORY Past Medical History:  Diagnosis Date  . Abdominal pain   . Acute renal failure ([place])     resolved  . Arthritis    "hands & legs" (11/'10/2014)  . Ataxia   . Atrial fibrillation ([place])   . Bladder outlet obstruction   . Bladder outlet obstruction   . BPH (benign prostatic hyperplasia)   .  CAD (coronary artery disease)    a. CABG IN 1989. b. 01/08/2015 CTO of ost LAD, LIMA to LAD not visualized but assumed patent given myoview finding, occluded SVG to diagonal, 99% mid RCA tx w/ SYNERGY DES 3X28 mm  . Constipation   . Diabetes mellitus type 2, insulin dependent ([place])   . Epistaxis, recurrent Feb 2017  . GERD (gastroesophageal reflux disease)   . HTN (hypertension)   . Hx of bacterial pneumonia   . Hyperlipemia   . Kidney stones   . Mild aortic stenosis   . Pneumonia 04/2019  . Rib fractures    left rib fractures being treated with pain medications  . Stented coronary artery Nov 2016   RCA DES  . Urinary tract infection     Enterococcus   Past Surgical History:  Procedure Laterality Date  . CARDIAC CATHETERIZATION  1989  . CARDIAC CATHETERIZATION N/A 01/08/2015   Procedure: Left Heart Cath and Cors/Grafts Angiography;  Surgeon: Peter M Martinique, MD;  Location: [facility];  Service: Cardiovascular;  Laterality: N/A;  . CARDIAC CATHETERIZATION  01/08/2015   Procedure: Coronary Stent Intervention;  Surgeon: Peter M Martinique, MD;  Location: [facility];  Service: Cardiovascular;;  . CARPAL TUNNEL RELEASE Right 08/2010  . CATARACT EXTRACTION W/ INTRAOCULAR LENS  IMPLANT, BILATERAL Bilateral   . CORONARY ANGIOPLASTY  01/08/15   RCA DES  . CORONARY ARTERY BYPASS GRAFT  1989   "CABG X 2"  . IR THORACENTESIS ASP PLEURAL SPACE W/IMG GUIDE  05/14/2019  . JOINT REPLACEMENT    . TONSILLECTOMY    . TOTAL HIP ARTHROPLASTY Right 2000    FAMILY HISTORY Family History  Problem Relation Age of Onset  . Brain cancer Father   . Throat cancer Brother     SOCIAL HISTORY Social History   Tobacco Use  . Smoking status: Former Smoker    Packs/day: 3.00    Years: 10.00    Pack years: 30.00    Types: Cigarettes    Quit date: 05/11/1958    Years since quitting: 62.2  . Smokeless tobacco: Never Used  Vaping Use  . Vaping Use: Never used  Substance Use Topics  . Alcohol  use: No  . Drug use: No         OPHTHALMIC EXAM:  Base Eye Exam    Visual Acuity (ETDRS)      Right Left   Dist cc 20/30 20/20   Dist ph cc 20/20        Tonometry (Tonopen, 9:04 AM)      Right Left   Pressure 13 13       Neuro/Psych    Mood/Affect: Normal        Slit Lamp and Fundus Exam    External Exam  Right Left   External Normal Normal       Slit Lamp Exam      Right Left   Lids/Lashes Normal Normal   Conjunctiva/Sclera White and quiet White and quiet   Cornea Clear Clear   Anterior Chamber Deep and quiet Deep and quiet   Iris Round and reactive Round and reactive   Lens Posterior chamber intraocular lens Posterior chamber intraocular lens   Anterior Vitreous Normal Normal       Fundus Exam      Right Left   Posterior Vitreous Posterior vitreous detachment Posterior vitreous detachment   Disc Normal Normal   C/D Ratio 0.65 0.6   Macula Normal Normal   Vessels Old retinal vein occlusion, compensated, microaneurysms temporally, not diffusely present NPDR- Moderate   Periphery Normal Normal          IMAGING AND PROCEDURES  Imaging and Procedures for 07/21/20  OCT, Retina - OU - Both Eyes       Right Eye Quality was good. Scan locations included subfoveal. Central Foveal Thickness: 294. Progression has been stable. Findings include abnormal foveal contour.   Left Eye Quality was good. Scan locations included subfoveal. Central Foveal Thickness: 276. Progression has been stable. Findings include normal foveal contour.   Notes Sectorial atrophy superior from old Hemi central retinal vein occlusion now compensated                ASSESSMENT/PLAN:  Moderate nonproliferative diabetic retinopathy of left eye ([place]) Minimal to no diabetic retinopathy.  Micro aneurysmal changes seen OD are mostly from compensated old and active CRV O with CME resolved  Stable hemispheric central retinal vein occlusion (CRVO) of right eye History of  disease in the past, not active, accounts for sectorial atrophy superiorly on OCT      ICD-10-CM   1. Stable hemispheric central retinal vein occlusion (CRVO) of right eye  H34.8112 OCT, Retina - OU - Both Eyes  2. Moderate nonproliferative diabetic retinopathy of left eye without macular edema associated with type 2 diabetes mellitus (HCC)  Z61.0960 OCT, Retina - OU - Both Eyes    1.  No specific therapy indicated patient to return follow-up visit in 9 months given the new findings of moderate nonproliferative diabetic eye disease left eye  2.  3.  Ophthalmic Meds Ordered this visit:  No orders of the defined types were placed in this encounter.      Return in about 9 months (around 04/23/2021) for DILATE OU, COLOR FP, OCT.  There are no Patient Instructions on file for this visit.   Explained the diagnoses, plan, and follow up with the patient and they expressed understanding.  Patient expressed understanding of the importance of proper follow up care.   Clent Demark Darlyn Repsher M.D. Diseases & Surgery of the Retina and Vitreous [facility] [place] 07/21/20     Abbreviations: M myopia (nearsighted); A astigmatism; H hyperopia (farsighted); P presbyopia; Mrx spectacle prescription;  CTL contact lenses; OD right eye; OS left eye; OU both eyes  XT exotropia; ET esotropia; PEK punctate epithelial keratitis; PEE punctate epithelial erosions; DES dry eye syndrome; MGD meibomian gland dysfunction; ATs artificial tears; PFAT's preservative free artificial tears; [place] nuclear sclerotic cataract; PSC posterior subcapsular cataract; ERM epi-retinal membrane; PVD posterior vitreous detachment; RD retinal detachment; DM diabetes mellitus; DR diabetic retinopathy; NPDR non-proliferative diabetic retinopathy; PDR proliferative diabetic retinopathy; CSME clinically significant macular edema; DME diabetic macular edema; dbh dot blot hemorrhages; CWS cotton wool spot;  POAG primary open angle  glaucoma; C/D cup-to-disc ratio; HVF humphrey visual field; GVF goldmann visual field; OCT optical coherence tomography; IOP intraocular pressure; BRVO Branch retinal vein occlusion; CRVO central retinal vein occlusion; CRAO central retinal artery occlusion; BRAO branch retinal artery occlusion; RT retinal tear; SB scleral buckle; PPV pars plana vitrectomy; VH Vitreous hemorrhage; PRP panretinal laser photocoagulation; IVK intravitreal kenalog; VMT vitreomacular traction; MH Macular hole;  NVD neovascularization of the disc; NVE neovascularization elsewhere; AREDS age related eye disease study; ARMD age related macular degeneration; POAG primary open angle glaucoma; EBMD epithelial/anterior basement membrane dystrophy; ACIOL anterior chamber intraocular lens; IOL intraocular lens; PCIOL posterior chamber intraocular lens; Phaco/IOL phacoemulsification with intraocular lens placement; [place] photorefractive keratectomy; LASIK laser assisted in situ keratomileusis; HTN hypertension; DM diabetes mellitus; COPD chronic obstructive pulmonary disease

## 2020-07-29 ENCOUNTER — Ambulatory Visit: Payer: Medicare Other | Admitting: Family Medicine

## 2020-08-01 ENCOUNTER — Ambulatory Visit: Payer: Medicare Other | Admitting: Family Medicine

## 2020-08-04 ENCOUNTER — Encounter: Payer: Self-pay | Admitting: Family Medicine

## 2020-08-04 ENCOUNTER — Ambulatory Visit (INDEPENDENT_AMBULATORY_CARE_PROVIDER_SITE_OTHER): Payer: Medicare Other | Admitting: Family Medicine

## 2020-08-04 ENCOUNTER — Other Ambulatory Visit: Payer: Self-pay

## 2020-08-04 VITALS — BP 130/76 | HR 81 | Temp 98.5°F | Wt 208.4 lb

## 2020-08-04 DIAGNOSIS — K5900 Constipation, unspecified: Secondary | ICD-10-CM | POA: Diagnosis not present

## 2020-08-04 DIAGNOSIS — Z7901 Long term (current) use of anticoagulants: Secondary | ICD-10-CM | POA: Diagnosis not present

## 2020-08-04 DIAGNOSIS — E782 Mixed hyperlipidemia: Secondary | ICD-10-CM

## 2020-08-04 DIAGNOSIS — I10 Essential (primary) hypertension: Secondary | ICD-10-CM

## 2020-08-04 DIAGNOSIS — Z794 Long term (current) use of insulin: Secondary | ICD-10-CM | POA: Diagnosis not present

## 2020-08-04 DIAGNOSIS — E119 Type 2 diabetes mellitus without complications: Secondary | ICD-10-CM | POA: Diagnosis not present

## 2020-08-04 DIAGNOSIS — M48062 Spinal stenosis, lumbar region with neurogenic claudication: Secondary | ICD-10-CM

## 2020-08-04 DIAGNOSIS — N1832 Chronic kidney disease, stage 3b: Secondary | ICD-10-CM | POA: Diagnosis not present

## 2020-08-04 LAB — POCT GLYCOSYLATED HEMOGLOBIN (HGB A1C): Hemoglobin A1C: 7.8 % — AB (ref 4.0–5.6)

## 2020-08-04 NOTE — Patient Instructions (Addendum)
Please return in 3 months for recheck.  You saw Dr. Frankey Shown at [facility]. Call their office to get scheduled to recheck your hip pain.   I will release your lab results to you on your MyChart account with further instructions. Please reply with any questions.   If you have any questions or concerns, please don't hesitate to send me a message via MyChart or call the office at [phone]. Thank you for visiting with [place] today! It's our pleasure caring for you.  Use colace twice a day to soften your stool; and miralax once or twice day.

## 2020-08-04 NOTE — Progress Notes (Signed)
Subjective  CC:  Chief Complaint  Patient presents with  . Diabetes  . Hypertension  . iron deficiency    Has stopped iron pills but still experiencing constipation   . Hip Pain    Requesting to restart hip injections, cannot remember who was doing them     HPI: [name] is a 87 y.o. male who presents to the office today for follow up of diabetes and problems listed above in the chief complaint.   Diabetes follow up: His diabetic control is reported as Unchanged.  Sugars are stable.  He is tolerating his medications.  No symptoms of balanitis.  No further dysuria. He denies exertional CP or SOB or symptomatic hypoglycemia. He denies foot sores or significant paresthesias.   Right back and hip pain: Has longstanding history of DJD and spinal stenosis.  Was seeing orthopedic surgery and Ortho care of [facility].  He has been a while since they have been seen.  Injections have helped in the past.  No new symptoms.  He has not been using pain medications  Iron induced constipation: Hard to move bowel movements.  No blood or melena.  No abdominal pain.  Stopped iron last week.  Started MiraLAX with some resolution of symptoms.  Hyperlipidemia: On statin.  Goal LDL less than 70.  Due for recheck.  Nonfasting.  Hypertension remains well controlled.  No chest pain  Recent hemorrhagic cystitis with allergic reaction to Septra: Reviewed records.  Fortunately his symptoms have all resolved.  Energy is back to normal.  No further rashes.  No dysuria.  Last urine culture was negative.  Of note, BMP at that time showed a mildly elevated creatinine above his baseline.  Could have been from dehydration.   Wt Readings from Last 3 Encounters:  08/04/20 208 lb 6.4 oz (94.5 kg)  07/03/20 209 lb (94.8 kg)  06/25/20 209 lb 12.8 oz (95.2 kg)    BP Readings from Last 3 Encounters:  08/04/20 130/76  07/03/20 (!) 111/59  06/25/20 126/72    Assessment  1. Diabetes mellitus type 2, insulin  dependent ([place])   2. Constipation, unspecified constipation type   3. Stage 3b chronic kidney disease ([place])   4. Essential (primary) hypertension   5. Current use of long term anticoagulation   6. Mixed hyperlipidemia   7. Spinal stenosis of lumbar region with neurogenic claudication      Plan   Diabetes is currently well controlled.  Doing well.  No changes today.  Reviewed care for constipation: Iron, add Colace twice a day continue MiraLAX.  Follow-up as needed.  Recheck kidney function.  Hopefully has returned to baseline.  Blood pressures well controlled.  Recheck lipids and statin.  Refer back to Ortho care from [facility] for back pain evaluation and possible steroid injections  Follow up: 3 months for recheck. Orders Placed This Encounter  Procedures  . Renal function panel  . Lipid panel  . POCT HgB A1C   No orders of the defined types were placed in this encounter.     Immunization History  Administered Date(s) Administered  . Fluad Quad(high Dose 65+) 11/02/2018, 11/30/2019  . Influenza Split 11/14/2007, 01/01/2009, 12/23/2009, 11/29/2012, 10/31/2015  . Influenza, High Dose Seasonal PF 11/02/2012, 12/12/2013, 11/15/2014, 11/24/2017, 01/17/2018  . Influenza, Seasonal, Injecte, Preservative Fre 11/18/2015  . Influenza,inj,Quad PF,6+ Mos 02/15/2017  . Influenza,trivalent, recombinat, inj, PF 12/07/2010  . Influenza-Unspecified 10/28/2011, 11/30/2014, 02/15/2017  . PFIZER(Purple Top)SARS-COV-2 Vaccination 03/23/2019, 04/14/2019, 12/06/2019  . Pneumococcal Conjugate-13 12/13/2013  .  Pneumococcal-Unspecified 08/20/2008  . Td 05/29/2002  . Tdap 10/28/2011, 02/15/2017  . Zoster Recombinat (Shingrix) 09/16/2016, 10/13/2016, 12/13/2016, 01/11/2017  . Zoster, Live 08/20/2008    Diabetes Related Lab Review: Lab Results  Component Value Date   HGBA1C 7.8 (A) 08/04/2020   HGBA1C 8.1 (H) 05/26/2020   HGBA1C 7.5 (A) 03/05/2020    Lab Results  Component Value  Date   MICROALBUR <0.7 03/14/2019   Lab Results  Component Value Date   CREATININE 1.72 (H) 07/03/2020   BUN 27 (H) 07/03/2020   NA 141 07/03/2020   K 4.7 07/03/2020   CL 104 07/03/2020   CO2 28 07/03/2020   Lab Results  Component Value Date   CHOL 147 08/13/2019   CHOL 143 10/04/2018   CHOL 146 12/07/2017   Lab Results  Component Value Date   HDL 49.70 08/13/2019   HDL 44.60 10/04/2018   HDL 44.70 12/07/2017   Lab Results  Component Value Date   LDLCALC 79 08/13/2019   LDLCALC 77 10/04/2018   [place] 85 12/07/2017   Lab Results  Component Value Date   TRIG 93.0 08/13/2019   TRIG 106.0 10/04/2018   TRIG 78.0 12/07/2017   Lab Results  Component Value Date   CHOLHDL 3 08/13/2019   CHOLHDL 3 10/04/2018   CHOLHDL 3 12/07/2017   No results found for: LDLDIRECT The ASCVD Risk score Mikey Bussing DC Jr., et al., 2013) failed to calculate for the following reasons:   The 2013 ASCVD risk score is only valid for ages 12 to 34   The patient has a prior MI or stroke diagnosis I have reviewed the PMH, Fam and Soc history. Patient Active Problem List   Diagnosis Date Noted  . Moderate nonproliferative diabetic retinopathy of left eye ([place]) 06/18/2020    Priority: High  . Stage 3b chronic kidney disease ([place]) 05/26/2020    Priority: High  . Aortic stenosis, moderate 05/22/2019    Priority: High    Stable echocardiogram 05/2020, Dr. Martinique   . Chronic diastolic CHF (congestive heart failure) ([place]) 03/14/2019    Priority: High  . Spinal stenosis of lumbar region 02/21/2018    Priority: High  . Diabetic peripheral neuropathy associated with type 2 diabetes mellitus ([place]) 05/05/2017    Priority: High    No pain   . Atherosclerotic heart disease of native coronary artery without angina pectoris 06/13/2016    Priority: High    Overview:  Overview:  RCA DES placed Nov 2016, LIMA-LAD presumed patent based on Myoview but no visualized, SVG-Dx occluded  Last Assessment & Plan:   RCA DES placed Nov 2016, LIMA-LAD presumed patent based on Myoview but no visualized, SVG-Dx occluded   . CVA (cerebral vascular accident) ([place]) 06/06/2016    Priority: High  . Recurrent coronary arteriosclerosis after percutaneous transluminal coronary angioplasty 04/18/2015    Priority: High    Overview:  Overview:  RCA DES placed Nov 2016, LIMA-LAD presumed patent based on Myoview but no visualized, SVG-Dx occluded  Last Assessment & Plan:  RCA DES placed Nov 2016, LIMA-LAD presumed patent based on Myoview but no visualized, SVG-Dx occluded   . CAD S/P PCI- Nov 2016     Priority: High    RCA DES placed Nov 2016, LIMA-LAD presumed patent based on Myoview but no visualized, SVG-Dx occluded   . Current use of long term anticoagulation 02/06/2013    Priority: High    Overview:  Monitored by cardiology   . Chronic atrial fibrillation ([place]) 07/30/2011  Priority: High    CHADs VASc=5 for age, HTN, vascular disease, and DM   . Hx of CABG x 2 1990     Priority: High    Status post CABG x2 in 1990 including an LIMA graft to the LAD, and a vein graft to the diagonal.    . Essential (primary) hypertension     Priority: High  . Diabetes mellitus type 2, insulin dependent (HCC)     Priority: High    Neg urine microalbuminuria, not on ace.  On farxiga   . Mixed hyperlipidemia     Priority: High  . Thoracic aortic aneurysm without rupture ([place]) 05/26/2020    Priority: Medium    Followed by cards. Stable by CT 2022   . Degeneration of lumbar intervertebral disc 02/21/2018    Priority: Medium  . Chronic vertigo 03/15/2014    Priority: Medium  . Obesity (BMI 30.0-34.9) 01/31/2012    Priority: Medium  . Enlarged prostate without lower urinary tract symptoms ([place]) 01/04/2011    Priority: Medium    Overview:  Urology - avodart and tamuloscin   . Gastro-esophageal reflux disease without esophagitis 12/07/2010    Priority: Medium  . Epistaxis, recurrent 04/11/2015     Priority: Low  . Osteoarthritis, knee 01/31/2012    Priority: Low  . Allergic rhinitis 10/28/2011    Priority: Low  . Hearing loss 06/08/2011    Priority: Low  . Primary osteoarthritis of hand 06/08/2011    Priority: Low  . Benign hematuria 08/27/2008    Priority: Low    Essential Hematuria - urology - Grapey w/u benign    . Stable hemispheric central retinal vein occlusion (CRVO) of right eye 07/21/2020  . Lower extremity edema 03/14/2019    Social History: Patient  reports that he quit smoking about 62 years ago. His smoking use included cigarettes. He has a 30.00 pack-year smoking history. He has never used smokeless tobacco. He reports that he does not drink alcohol and does not use drugs.  Review of Systems: Ophthalmic: negative for eye pain, loss of vision or double vision Cardiovascular: negative for chest pain Respiratory: negative for SOB or persistent cough Gastrointestinal: negative for abdominal pain Genitourinary: negative for dysuria or gross hematuria MSK: negative for foot lesions Neurologic: negative for weakness or gait disturbance  Objective  Vitals: BP 130/76   Pulse 81   Temp 98.5 F (36.9 C) (Temporal)   Wt 208 lb 6.4 oz (94.5 kg)   SpO2 98%   BMI 38.12 kg/m  General: well appearing, no acute distress  Psych:  Alert and oriented, normal mood and affect HEENT:  Normocephalic, atraumatic, moist mucous membranes, supple neck  Cardiovascular: Irregularly irregular with murmur Respiratory:  Good breath sounds bilaterally, CTAB with normal effort, no rales No abdominal tenderness    Diabetic education: ongoing education regarding chronic disease management for diabetes was given today. We continue to reinforce the ABC's of diabetic management: A1c (<7 or 8 dependent upon patient), tight blood pressure control, and cholesterol management with goal LDL < 100 minimally. We discuss diet strategies, exercise recommendations, medication options and possible  side effects. At each visit, we review recommended immunizations and preventive care recommendations for diabetics and stress that good diabetic control can prevent other problems. See below for this patient's data.    Commons side effects, risks, benefits, and alternatives for medications and treatment plan prescribed today were discussed, and the patient expressed understanding of the given instructions. Patient is instructed to call or message  via MyChart if he/she has any questions or concerns regarding our treatment plan. No barriers to understanding were identified. We discussed Red Flag symptoms and signs in detail. Patient expressed understanding regarding what to do in case of urgent or emergency type symptoms.   Medication list was reconciled, printed and provided to the patient in AVS. Patient instructions and summary information was reviewed with the patient as documented in the AVS. This note was prepared with assistance of Dragon voice recognition software. Occasional wrong-word or sound-a-like substitutions may have occurred due to the inherent limitations of voice recognition software  This visit occurred during the SARS-CoV-2 public health emergency.  Safety protocols were in place, including screening questions prior to the visit, additional usage of staff PPE, and extensive cleaning of exam room while observing appropriate contact time as indicated for disinfecting solutions.

## 2020-08-05 LAB — RENAL FUNCTION PANEL
Albumin: 4.2 g/dL (ref 3.5–5.2)
BUN: 20 mg/dL (ref 6–23)
CO2: 31 mEq/L (ref 19–32)
Calcium: 9.2 mg/dL (ref 8.4–10.5)
Chloride: 101 mEq/L (ref 96–112)
Creatinine, Ser: 1.24 mg/dL (ref 0.40–1.50)
GFR: 51.29 mL/min — ABNORMAL LOW (ref >60.00)
Glucose, Bld: 198 mg/dL — ABNORMAL HIGH (ref 70–99)
Phosphorus: 3.9 mg/dL (ref 2.3–4.6)
Potassium: 4.8 mEq/L (ref 3.5–5.1)
Sodium: 139 mEq/L (ref 135–145)

## 2020-08-05 LAB — LIPID PANEL
Cholesterol: 154 mg/dL (ref 0–200)
HDL: 46.1 mg/dL (ref >39.00)
LDL Cholesterol: 90 mg/dL (ref 0–99)
NonHDL: 107.53
Total CHOL/HDL Ratio: 3
Triglycerides: 86 mg/dL (ref 0.0–149.0)
VLDL: 17.2 mg/dL (ref 0.0–40.0)

## 2020-08-05 NOTE — Progress Notes (Signed)
Please call patient: I have reviewed his/her lab results. Kidney function has returned to his baseline. This is good news. Cholesterol levels are fair on the atorvastatin. Please continue taking it.

## 2020-08-06 DIAGNOSIS — Z23 Encounter for immunization: Secondary | ICD-10-CM | POA: Diagnosis not present

## 2020-08-08 ENCOUNTER — Other Ambulatory Visit: Payer: Self-pay

## 2020-08-08 ENCOUNTER — Ambulatory Visit: Payer: Self-pay

## 2020-08-08 ENCOUNTER — Encounter: Payer: Self-pay | Admitting: Orthopaedic Surgery

## 2020-08-08 ENCOUNTER — Ambulatory Visit (INDEPENDENT_AMBULATORY_CARE_PROVIDER_SITE_OTHER): Payer: Medicare Other | Admitting: Orthopaedic Surgery

## 2020-08-08 DIAGNOSIS — Z9861 Coronary angioplasty status: Secondary | ICD-10-CM

## 2020-08-08 DIAGNOSIS — I2510 Atherosclerotic heart disease of native coronary artery without angina pectoris: Secondary | ICD-10-CM

## 2020-08-08 DIAGNOSIS — M25551 Pain in right hip: Secondary | ICD-10-CM | POA: Diagnosis not present

## 2020-08-08 NOTE — Progress Notes (Signed)
Office Visit Note   Patient: [name]           Date of Birth: 26-Aug-1928           MRN: [id number] Visit Date: 08/08/2020              Requested by: [name], [place],  [place] [id number] PCP: [name], MD   Assessment & Plan: Visit Diagnoses:  1. Pain in right hip     Plan: Impression is right lateral hip pain due to greater trochanteric bursitis and gluteal tendinopathy with a history of right THA in 1995.  The main issue is coming from his abductors and he received really good relief from the prior injection.  We discussed that his intermittent groin pain is due to the chronic changes of his previous THA particularly the wearing down of his polyethylene component. We discussed that the only way to fix this would be a hip revision however I would not consider this option given his age. In regards to his lateral hip pain we are happy to order another trochanteric injection for him today. We will call up to Dr. Junius Roads to see if he can be seen today.   Follow-Up Instructions: As needed  Orders:  Orders Placed This Encounter  Procedures   XR HIP UNILAT W OR W/O PELVIS 2-3 VIEWS RIGHT   US Guided Needle Placement - No Linked Charges   No orders of the defined types were placed in this encounter.    Procedures: No procedures performed   Clinical Data: No additional findings.   Subjective: Chief Complaint  Patient presents with   Right Hip - Pain    HPI [name] is a 87 year old male who presents for evaluation of right hip pain. He has a history of a right total hip arthroplasty that was performed in [place] in 1995 and more recently greater trochanteric pain that is managed with injections approximately once a year. His last injection was done in December of 2020 and provided excellent pain relief until 1.5 months ago when his hip pain returned. No new injuries. He describes predominantly lateral pain that occasionally  radiates to his groin. Pain is worse with walking, sit to stand and weather changes. He has been using a cane for ambulation recently due to his hip pain and taking hydrocodone infrequently from his PCP. He denies weakness, instability, mechanical symptoms, back pain, numbness or tingling in the lower extremity.   Review of Systems Review of Systems was reviewed and negative unless as stated in the HPI.  Objective: Vital Signs: There were no vitals taken for this visit.  Physical Exam  Ortho Exam Right hip exam reveals tenderness to palpation over the greater trochanter and lateral hip pain with hip ROM. Distal neurovascular exam intact.   Specialty Comments:  No specialty comments available.  Imaging: See separate dictation   PMFS History: Patient Active Problem List   Diagnosis Date Noted   Stable hemispheric central retinal vein occlusion (CRVO) of right eye 07/21/2020   Moderate nonproliferative diabetic retinopathy of left eye ([facility]) 06/18/2020   Thoracic aortic aneurysm without rupture ([facility]) 05/26/2020   Stage 3b chronic kidney disease ([facility]) 05/26/2020   Aortic stenosis, moderate 05/22/2019   Chronic diastolic CHF (congestive heart failure) ([facility]) 03/14/2019   Lower extremity edema 03/14/2019   Spinal stenosis of lumbar region 02/21/2018   Degeneration of lumbar intervertebral disc 02/21/2018   Diabetic peripheral neuropathy associated  with type 2 diabetes mellitus ([facility]) 05/05/2017   Atherosclerotic heart disease of native coronary artery without angina pectoris 06/13/2016   CVA (cerebral vascular accident) ([facility]) 06/06/2016   Recurrent coronary arteriosclerosis after percutaneous transluminal coronary angioplasty 04/18/2015   Epistaxis, recurrent 04/11/2015   CAD S/P PCI- Nov 2016    Chronic vertigo 03/15/2014   Current use of long term anticoagulation 02/06/2013   Obesity (BMI 30.0-34.9) 01/31/2012   Osteoarthritis, knee 01/31/2012   Allergic rhinitis 10/28/2011    Chronic atrial fibrillation ([facility]) 07/30/2011   Hearing loss 06/08/2011   Primary osteoarthritis of hand 06/08/2011   Enlarged prostate without lower urinary tract symptoms (luts) 01/04/2011   Gastro-esophageal reflux disease without esophagitis 12/07/2010   Hx of CABG x 2 1990    Essential (primary) hypertension    Diabetes mellitus type 2, insulin dependent ([facility])    Mixed hyperlipidemia    Benign hematuria 08/27/2008   Past Medical History:  Diagnosis Date   Abdominal pain    Acute renal failure ([facility])     resolved   Arthritis    "hands & legs" (11/'10/2014)   Ataxia    Atrial fibrillation (H[facility])    Bladder outlet obstruction    Bladder outlet obstruction    BPH (benign prostatic hyperplasia)    CAD (coronary artery disease)    a. CABG IN 1989. b. 01/08/2015 CTO of ost LAD, LIMA to LAD not visualized but assumed patent given myoview finding, occluded SVG to diagonal, 99% mid RCA tx w/ SYNERGY DES 3X28 mm   Constipation    Diabetes mellitus type 2, insulin dependent (HCC)    Epistaxis, recurrent Feb 2017   GERD (gastroesophageal reflux disease)    HTN (hypertension)    Hx of bacterial pneumonia    Hyperlipemia    Kidney stones    Mild aortic stenosis    Pneumonia 04/2019   Rib fractures    left rib fractures being treated with pain medications   Stented coronary artery Nov 2016   RCA DES   Urinary tract infection     Enterococcus    Family History  Problem Relation Age of Onset   Brain cancer Father    Throat cancer Brother     Past Surgical History:  Procedure Laterality Date   [place] N/A 01/08/2015   Procedure: Left Heart Cath and Cors/Grafts Angiography;  Surgeon: Peter M Martinique, MD;  Location: [facility];  Service: Cardiovascular;  Laterality: N/A;   CARDIAC CATHETERIZATION  01/08/2015   Procedure: Coronary Stent Intervention;  Surgeon: Peter M Martinique, MD;  Location: [facility];  Service:  Cardiovascular;;   CARPAL TUNNEL RELEASE Right 08/2010   CATARACT EXTRACTION W/ INTRAOCULAR LENS  IMPLANT, BILATERAL Bilateral    CORONARY ANGIOPLASTY  01/08/15   RCA DES   CORONARY ARTERY BYPASS GRAFT  1989   "CABG X 2"   IR THORACENTESIS ASP PLEURAL SPACE W/IMG GUIDE  05/14/2019   JOINT REPLACEMENT     TONSILLECTOMY     TOTAL HIP ARTHROPLASTY Right 2000   Social History   Occupational History   Occupation: Agricultural consultant  Tobacco Use   Smoking status: Former    Packs/day: 3.00    Years: 10.00    Pack years: 30.00    Types: Cigarettes    Quit date: 05/11/1958    Years since quitting: 62.2   Smokeless tobacco: Never  Vaping Use   Vaping Use: Never used  Substance and Sexual  Activity   Alcohol use: No   Drug use: No   Sexual activity: Not Currently

## 2020-08-08 NOTE — Progress Notes (Signed)
Subjective: He is here for ultrasound-guided left greater trochanter injection.  He is status post replacement many years ago.  He is not a good surgical candidate for revision.  Objective: He is tender over the greater trochanter.  Procedure: Ultrasound-guided injection: After sterile prep with Betadine, injected 4 cc 0.25% bupivacaine and 6 mg betamethasone into the region of the greater trochanter without complication.  He had good relief during the anesthetic phase.

## 2020-08-22 ENCOUNTER — Other Ambulatory Visit: Payer: Self-pay

## 2020-08-22 ENCOUNTER — Ambulatory Visit (INDEPENDENT_AMBULATORY_CARE_PROVIDER_SITE_OTHER): Payer: Medicare Other | Admitting: Podiatry

## 2020-08-22 ENCOUNTER — Encounter: Payer: Self-pay | Admitting: Podiatry

## 2020-08-22 DIAGNOSIS — M79674 Pain in right toe(s): Secondary | ICD-10-CM

## 2020-08-22 DIAGNOSIS — D689 Coagulation defect, unspecified: Secondary | ICD-10-CM

## 2020-08-22 DIAGNOSIS — B351 Tinea unguium: Secondary | ICD-10-CM

## 2020-08-22 DIAGNOSIS — E1142 Type 2 diabetes mellitus with diabetic polyneuropathy: Secondary | ICD-10-CM

## 2020-08-22 NOTE — Progress Notes (Signed)
This patient returns to my office for at risk foot care.  This patient requires this care by a professional since this patient will be at risk due to having diabetic neuropathy and coagulation defect.  Patient is taking eliquiss.  Patient says toenails are painful walking and wearing his shoes.  This patient presents for at risk foot care today.  General Appearance  Alert, conversant and in no acute stress.  Vascular  Dorsalis pedis and posterior tibial  pulses are weakly  palpable  bilaterally.  Capillary return is within normal limits  bilaterally. Temperature is within normal limits  bilaterally.  Neurologic  Senn-Weinstein monofilament wire test within normal limits  bilaterally. Muscle power within normal limits bilaterally.  Nails Thick disfigured discolored nails with subungual debris  from hallux to fifth toes bilaterally. No evidence of bacterial infection or drainage bilaterally.  Orthopedic  No limitations of motion  feet .  No crepitus or effusions noted.  No bony pathology or digital deformities noted.  Skin  normotropic skin with no porokeratosis noted bilaterally.  No signs of infections or ulcers noted.     Onychomycosis  B/L  Consent was obtained for treatment procedures.   Debridement of nails with nail nipper followed by dremel tool. Filed with dremel without incident.    Return office visit    12   weeks                Told patient to return for periodic foot care and evaluation due to potential at risk complications.   [name] DPM  

## 2020-08-29 NOTE — Progress Notes (Signed)
Cardiology Office Note   Date:  09/03/2020   ID:  [name], DOB 26-Dec-1928, MRN [id number]  PCP:  [name], MD  Cardiologist:  Akina Maish Martinique, MD EP: None  Chief Complaint  Patient presents with   Coronary Artery Disease       History of Present Illness: [name] is a 87 y.o. male with PMH of CAD s/p CABG in 1990 with subsequent DES to RCA in 2006, permanent atrial fibrillation, chronic diastolic CHF, aortic stenosis, HTN, HLD, carotid artery disease, DM type 2, CVA, who presents for  follow-up.   He was  admitted to the hospital 05/13/19-05/18/19 for acute respiratory failure 2/2 PNA and pleural effusion, managed with IV antibiotics and a thoracentesis. Cardiology followed that admission for assistance with atrial fibrillation with RVR and he was discharged home on diltiazem 360mg  and metoprolol succinate 100mg  BID for rate control, apixaban 5mg  BID for stroke ppx, and lasix 40mg  daily for CHF. Echo 05/14/19 showed EF 60-65%, moderate concentric LVH, indeterminate LV diastolic function, mild AI, moderate AS, and ascending aortic aneurysm of 4.8cm (up from 4.3cm 09/2018).   On follow up today he is doing well. The only complaint is that he has intermittent dizziness. No palpitations, dyspnea, chest pain. No edema. Wears compression socks.     Past Medical History:  Diagnosis Date   Abdominal pain    Acute renal failure ([facility])     resolved   Arthritis    "hands & legs" (11/'10/2014)   Ataxia    Atrial fibrillation (HCC)    Bladder outlet obstruction    Bladder outlet obstruction    BPH (benign prostatic hyperplasia)    CAD (coronary artery disease)    a. CABG IN 1989. b. 01/08/2015 CTO of ost LAD, LIMA to LAD not visualized but assumed patent given myoview finding, occluded SVG to diagonal, 99% mid RCA tx w/ SYNERGY DES 3X28 mm   Constipation    Diabetes mellitus type 2, insulin dependent ([facility])    Epistaxis, recurrent Feb 2017   GERD (gastroesophageal reflux  disease)    HTN (hypertension)    Hx of bacterial pneumonia    Hyperlipemia    Kidney stones    Mild aortic stenosis    Pneumonia 04/2019   Rib fractures    left rib fractures being treated with pain medications   Stented coronary artery Nov 2016   RCA DES   Urinary tract infection     Enterococcus    Past Surgical History:  Procedure Laterality Date   [place] N/A 01/08/2015   Procedure: Left Heart Cath and Cors/Grafts Angiography;  Surgeon: Natania Finigan M Martinique, MD;  Location: [facility];  Service: Cardiovascular;  Laterality: N/A;   CARDIAC CATHETERIZATION  01/08/2015   Procedure: Coronary Stent Intervention;  Surgeon: Celso Granja M Martinique, MD;  Location: [facility];  Service: Cardiovascular;;   CARPAL TUNNEL RELEASE Right 08/2010   CATARACT EXTRACTION W/ INTRAOCULAR LENS  IMPLANT, BILATERAL Bilateral    CORONARY ANGIOPLASTY  01/08/15   RCA DES   CORONARY ARTERY BYPASS GRAFT  1989   "CABG X 2"   IR THORACENTESIS ASP PLEURAL SPACE W/IMG GUIDE  05/14/2019   JOINT REPLACEMENT     TONSILLECTOMY     TOTAL HIP ARTHROPLASTY Right 2000     Current Outpatient Medications  Medication Sig Dispense Refill   apixaban (ELIQUIS) 5 MG TABS tablet Take 1 tablet (5 mg total) by mouth 2 (  two) times daily. 60 tablet 2   atorvastatin (LIPITOR) 40 MG tablet Take 1 tablet (40 mg total) by mouth daily at 6 PM. 30 tablet 2   AVODART 0.5 MG capsule Take 0.5 mg by mouth every Monday, Wednesday, and Friday.      Cholecalciferol 50 MCG (2000 UT) TABS Take 1 tablet by mouth daily.     diltiazem (TIAZAC) 360 MG 24 hr capsule Take 1 capsule by mouth daily.     empagliflozin (JARDIANCE) 25 MG TABS tablet Take 1 tablet (25 mg total) by mouth daily before breakfast. 30 tablet    fluconazole (DIFLUCAN) 150 MG tablet Take 1 tablet (150 mg total) by mouth daily. 3 tablet 0   fluticasone (FLONASE) 50 MCG/ACT nasal spray SHAKE LIQUID AND USE 1 SPRAY IN EACH  NOSTRIL DAILY 16 g 5   furosemide (LASIX) 40 MG tablet TAKE 1 TABLET(40 MG) BY MOUTH DAILY 90 tablet 3   gabapentin (NEURONTIN) 300 MG capsule TAKE 1 CAPSULE(300 MG) BY MOUTH THREE TIMES DAILY 270 capsule 3   HYDROcodone-acetaminophen (NORCO/VICODIN) 5-325 MG tablet Take 1 tablet by mouth 2 (two) times daily as needed for moderate pain. 30 tablet 0   insulin glargine (LANTUS) 100 UNIT/ML injection Inject 40 Units into the skin every morning.     metFORMIN (GLUCOPHAGE) 500 MG tablet Take 500 mg by mouth 2 (two) times daily with a meal.     nitroGLYCERIN (NITROSTAT) 0.4 MG SL tablet Place 0.4 mg under the tongue every 5 (five) minutes as needed for chest pain (x 3 doses).     pantoprazole (PROTONIX) 40 MG tablet TAKE 1 TABLET BY MOUTH DAILY AT NOON 90 tablet 3   potassium chloride SA (KLOR-CON) 20 MEQ tablet Take 1 tablet every other day 90 tablet 3   PRECISION XTRA TEST STRIPS test strip USE TO TEST BLOOD SUGAR 1 TO 2 TIMES DAILY 200 strip 6   tamsulosin (FLOMAX) 0.4 MG CAPS capsule Take 0.4 mg by mouth every Tuesday, Thursday, Saturday, and Sunday.     metoprolol succinate (TOPROL-XL) 100 MG 24 hr tablet Take 1 tablet (100 mg total) by mouth daily. Take with or immediately following a meal. 180 tablet 3   No current facility-administered medications for this visit.    Allergies:   Erythromycin base, Septra [sulfamethoxazole-trimethoprim], Cefadroxil, Ciprofloxacin, and Erythromycin    Social History:  The patient  reports that he quit smoking about 62 years ago. His smoking use included cigarettes. He has a 30.00 pack-year smoking history. He has never used smokeless tobacco. He reports that he does not drink alcohol and does not use drugs.   Family History:  The patient's family history includes Brain cancer in his father; Throat cancer in his brother.    ROS:  Please see the history of present illness.   Otherwise, review of systems are positive for none.   All other systems are reviewed  and negative.    PHYSICAL EXAM: VS:  BP 130/62   Pulse (!) 48   Wt 205 lb (93 kg)   SpO2 98%   BMI 37.49 kg/m  , BMI Body mass index is 37.49 kg/m. GEN: Well nourished, well developed, in no acute distress HEENT: normal Neck: no JVD, carotid bruits, or masses Cardiac: IRIR; gr 2/6 systolic murmur RUSB>>apex, rubs, or gallops, trace LE edema  Respiratory:  clear to auscultation bilaterally, normal work of breathing GI: soft, nontender, nondistended, + BS MS: no deformity or atrophy. No biceps tenderness Skin: warm and dry,  no rash Neuro:  Strength and sensation are intact Psych: euthymic mood, full affect   EKG:  EKG is not ordered today.     Recent Labs: 11/21/2019: TSH 2.06 07/03/2020: ALT 13; Hemoglobin 11.3; Platelets 275.0 08/04/2020: BUN 20; Creatinine, Ser 1.24; Potassium 4.8; Sodium 139    Lipid Panel    Component Value Date/Time   CHOL 154 08/04/2020 1201   TRIG 86.0 08/04/2020 1201   HDL 46.10 08/04/2020 1201   CHOLHDL 3 08/04/2020 1201   VLDL 17.2 08/04/2020 1201   LDLCALC 90 08/04/2020 1201      Wt Readings from Last 3 Encounters:  09/03/20 205 lb (93 kg)  08/04/20 208 lb 6.4 oz (94.5 kg)  07/03/20 209 lb (94.8 kg)      Other studies Reviewed: Additional studies/ records that were reviewed today include:   2D echo 05/14/2019 IMPRESSIONS    1. Left ventricular ejection fraction, by estimation, is 60 to 65%. The  left ventricle has normal function. The left ventricle has no regional  wall motion abnormalities. There is moderate concentric left ventricular  hypertrophy. Left ventricular  diastolic parameters are indeterminate.   2. Right ventricular systolic function is normal. The right ventricular  size is normal.   3. The mitral valve is normal in structure. No evidence of mitral valve  regurgitation. No evidence of mitral stenosis.   4. The aortic valve is normal in structure. Aortic valve regurgitation is  mild. Moderate aortic valve  stenosis. Aortic valve area, by VTI measures  1.27 cm. Aortic valve mean gradient measures 19.3 mmHg. Aortic valve Vmax  measures 2.75 m/s.   5. Compared with th echo 09/2018, ascending aorta has increased from 4.3  cm to 4.8cm. . Aortic dilatation noted. There is moderate dilatation of  the ascending aorta measuring 48 mm.   6. The inferior vena cava is normal in size with greater than 50%  respiratory variability, suggesting right atrial pressure of 3 mmHg.   Left heart catheterization 2016: Prox RCA lesion, 40% stenosed. LM lesion, 20% stenosed. Ost LAD to Prox LAD lesion, 100% stenosed. SVG . Origin lesion, 100% stenosed. Mid RCA-2 lesion, 70% stenosed. Mid RCA-1 lesion, 99% stenosed. Post intervention, there is a 0% residual stenosis.   1. Severe 2 vessel obstructive CAD. CTO of the origin of the LAD. Critical mid RCA stenosis. 2. Occluded SVG to the diagonal 3. LIMA to the LAD was not visualized but assumed patent based on clinical history and Myoview findings. 4. Normal LV EDP. 5. Successful stenting of the Mid RCA with DES. Very difficult procedure due to vessel tortuosity.   Plan: DAPT with ASA and Plavix for one month then stop ASA and continue Plavix for at least one year. Resume Coumadin tomorrow. Will assess LV function with an Echo. Stop prilosec and start protonix. Anticipate DC in am if stable. Patient noted to be bradycardic throughout case so will reduce metoprolol to 50 mg daily.  CT CHEST WITHOUT CONTRAST   TECHNIQUE: Multidetector CT imaging of the chest was performed following the standard protocol without IV contrast.   COMPARISON:  CT angiography from June 02, 2016 and from October 03, 2017   FINDINGS: Cardiovascular: Post median sternotomy for CABG. Ascending thoracic aorta measuring 4.3 cm with stable appearance. Calcified atheromatous plaque of the thoracic aorta and its branches in the chest.   Heart size is stable, extensive calcified coronary  artery disease post CABG and LIMA grafting.   Engorged central pulmonary vasculature 3.2 cm caliber of  the main pulmonary artery Limited assessment of cardiovascular structures given lack of intravenous contrast.   Mediastinum/Nodes: Thoracic inlet structures are normal. No axillary lymphadenopathy. No mediastinal lymphadenopathy. No gross hilar lymphadenopathy.   Lungs/Pleura: Bandlike opacity with mild ground-glass attenuation in the LEFT lung base. Bandlike changes are present in the LEFT lower lobe. Resolution of pleural fluid seen on previous chest x-rays from March of 2021   Pulmonary nodule in the superior segment of the RIGHT lower lobe 6 mm unchanged from previous imaging (image 49, series 3) no consolidation. No pleural effusion.   (Image 72, series 3) 4 mm nodule with some surrounding ground-glass opacity in the central LEFT lower lobe   Airways are patent.   Upper Abdomen: Incidental imaging of upper abdominal contents with signs of vascular disease of the abdominal aorta. Small hiatal hernia. No acute upper abdominal process.   Musculoskeletal: No acute bone finding. No destructive bone process.   IMPRESSION: 1. Stable appearance of the mild aneurysmal dilation ascending thoracic aorta measuring 4.3 cm. Recommend annual imaging followup by CTA or MRA. This recommendation follows 2010 ACCF/AHA/AATS/ACR/ASA/SCA/SCAI/SIR/STS/SVM Guidelines for the Diagnosis and Management of Patients with Thoracic Aortic Disease. Circulation. 2010; 121: C947-S962. Aortic aneurysm NOS (ICD10-I71.9) 2. Stable 6 mm nodule in the superior segment of the RIGHT lower lobe. 3. 4 mm nodule with some surrounding ground-glass opacity in the central LEFT lower lobe. This is new compared to prior imaging. No follow-up needed if patient is low-risk. Non-contrast chest CT can be considered in 12 months if patient is high-risk. This recommendation follows the consensus statement: Guidelines  for Management of Incidental Pulmonary Nodules Detected on CT Images: From the Fleischner Society 2017; Radiology 2017; 284:228-243. 4. Resolution of pleural fluid seen on previous chest x-rays from March of 2021. Bandlike opacity in the area of prior LEFT lower lobe airspace disease suggests mixture of atelectasis and post infectious scarring. 5. Stable signs of vascular disease of the thoracic aorta and its branches in the chest. 6. Aortic atherosclerosis.   Aortic Atherosclerosis (ICD10-I70.0).   Aortic aneurysm NOS (ICD10-I71.9).     Electronically Signed   By: Zetta Bills M.D.   On: 02/05/2020 08:23  Echo 06/10/20: IMPRESSIONS     1. Left ventricular ejection fraction, by estimation, is 60 to 65%. The  left ventricle has normal function. The left ventricle has no regional  wall motion abnormalities. There is moderate left ventricular hypertrophy.  Left ventricular diastolic function   could not be evaluated.   2. Right ventricular systolic function is mildly reduced. The right  ventricular size is normal. There is moderately elevated pulmonary artery  systolic pressure.   3. Left atrial size was severely dilated.   4. Right atrial size was moderately dilated.   5. The mitral valve is abnormal. Trivial mitral valve regurgitation.   6. The aortic valve is tricuspid. There is moderate calcification of the  aortic valve. Aortic valve regurgitation is trivial. Mild to moderate  aortic valve stenosis. Aortic valve area, by VTI measures 1.47 cm. Aortic  valve mean gradient measures 18.0  mmHg. Aortic valve Vmax measures 2.78 m/s. DI is 0.30.   7. Aortic dilatation noted. There is mild dilatation of the aortic root,  measuring 43 mm. There is mild dilatation of the ascending aorta,  measuring 43 mm.   8. The inferior vena cava is normal in size with greater than 50%  respiratory variability, suggesting right atrial pressure of 3 mmHg.   Comparison(s): No significant  change from  prior study. 05/14/2019: LVEF  60-65%, moderate LVH, moderate AS - mean gradient 19.3 mmHg.   ASSESSMENT AND PLAN:  1. CAD s/p CABG in 1990 with DES to RCA in 2006: Not on aspirin given need for anticoagulation - Continue statin - Continue metoprolol  2. Permanent atrial fibrillation: HR is slow and this may be contributing to his dizziness - No complaints of bleeding - reduce metoprolol succinate to 100 mg daily and continue diltiazem at 360 mg for rate control - Continue eliquis for stroke ppx. (does not meet requirements for reduced dose)  3. Chronic diastolic CHF: no volume overload complaints and he appears euvolemic on exam - Continue lasix 40mg  daily and potassium - Continue metoprolol succinate - Continue to limit salt intake  4. HTN: BP is well controlled - Continue metoprolol succinate, diltiazem, and lasix  5. Aortic stenosis: moderate on echo 05/14/19- repeat in April 2022 without change.  - Continue annual surveillance echo's   6. Ascending aortic aneurysm: last echo 05/14/19 showed 4.8cm aneurysm, up from 4.3 09/2018. More recent CT 02/05/20 showed it was 4.3 cm. Stable. Stable by Echo.     Current medicines are reviewed at length with the patient today.  The patient does not have concerns regarding medicines.  The following changes have been made:  As above  Labs/ tests ordered today include:   No orders of the defined types were placed in this encounter.    Disposition:   FU 6 months   Signed, Kaisy Severino Martinique, MD  09/03/2020 4:00 PM

## 2020-09-03 ENCOUNTER — Ambulatory Visit (INDEPENDENT_AMBULATORY_CARE_PROVIDER_SITE_OTHER): Payer: Medicare Other | Admitting: Cardiology

## 2020-09-03 ENCOUNTER — Encounter: Payer: Self-pay | Admitting: Cardiology

## 2020-09-03 ENCOUNTER — Other Ambulatory Visit: Payer: Self-pay

## 2020-09-03 VITALS — BP 130/62 | HR 48 | Wt 205.0 lb

## 2020-09-03 DIAGNOSIS — I712 Thoracic aortic aneurysm, without rupture: Secondary | ICD-10-CM | POA: Diagnosis not present

## 2020-09-03 DIAGNOSIS — I350 Nonrheumatic aortic (valve) stenosis: Secondary | ICD-10-CM | POA: Diagnosis not present

## 2020-09-03 DIAGNOSIS — I2510 Atherosclerotic heart disease of native coronary artery without angina pectoris: Secondary | ICD-10-CM

## 2020-09-03 DIAGNOSIS — I7121 Aneurysm of the ascending aorta, without rupture: Secondary | ICD-10-CM

## 2020-09-03 DIAGNOSIS — Z9861 Coronary angioplasty status: Secondary | ICD-10-CM | POA: Diagnosis not present

## 2020-09-03 DIAGNOSIS — I482 Chronic atrial fibrillation, unspecified: Secondary | ICD-10-CM

## 2020-09-03 MED ORDER — METOPROLOL SUCCINATE ER 100 MG PO TB24: 100.0000 mg | ORAL_TABLET | Freq: Every day | ORAL | 3 refills | Status: DC

## 2020-09-08 ENCOUNTER — Telehealth: Payer: Self-pay

## 2020-09-08 NOTE — Chronic Care Management (AMB) (Signed)
Chronic Care Management Pharmacy Assistant   Name: Ron Beske  MRN: [id number] DOB: 07-23-1928  Reason for Encounter: Diabetes Mellitus Disease State Call  Recent office visits:  08/04/20- Billey Chang, MD- chronic conditions addressed, no medication changes, recommended colace twice daily for constipation,  follow up 3 months  07/07/20- Telephone Encounter- Dr. Jonni Sanger prescribed fluconazole 150 mg x 3 days for yeast balanitis, renewed hydrocodone- acetaminophen 5-325 mg prn for pain  05/05/22Billey Chang, MD- seen for acute hemorrhagic cystitis, short course Augmentin 875-125 mg x 7 days, follow up as scheduled  06/25/20- Billey Chang, MD- seen for acute hemorrhagic cystitis, short course amoxicillin 875 mg x 7 days, follow up as scheduled   Recent consult visits:  09/03/20- Peter Martinique, MD (Cardiology)- seen for coronary artery disease, increased empagliflozin from 10 mg daily to 25 mg daily, decreased metoprolol succinate 100 mg from one tab twice daily to one tab daily, stop ASA in one month follow up 6 months  08/22/20- Gardiner Barefoot, DPM (Podiatry)- seen for at risk foot care, debridement performed, no medication changes, follow up 3 months  08/08/20- Frankey Shown, MD (Orthopedics)- seen for pain in right hip, x- ray ordered, no medication changes, no follow up documented 07/21/20- Deloria Lair, MD (Opthalmology)- seen for retina evaluation, no medication changes, follow up 9 months   Hospital visits:  None in previous 6 months  Medications: Outpatient Encounter Medications as of 09/08/2020  Medication Sig Note   apixaban (ELIQUIS) 5 MG TABS tablet Take 1 tablet (5 mg total) by mouth 2 (two) times daily.    atorvastatin (LIPITOR) 40 MG tablet Take 1 tablet (40 mg total) by mouth daily at 6 PM.    AVODART 0.5 MG capsule Take 0.5 mg by mouth every Monday, Wednesday, and Friday.     Cholecalciferol 50 MCG (2000 UT) TABS Take 1 tablet by mouth daily.    diltiazem (TIAZAC) 360 MG  24 hr capsule Take 1 capsule by mouth daily.    empagliflozin (JARDIANCE) 25 MG TABS tablet Take 1 tablet (25 mg total) by mouth daily before breakfast.    fluconazole (DIFLUCAN) 150 MG tablet Take 1 tablet (150 mg total) by mouth daily.    fluticasone (FLONASE) 50 MCG/ACT nasal spray SHAKE LIQUID AND USE 1 SPRAY IN EACH NOSTRIL DAILY    furosemide (LASIX) 40 MG tablet TAKE 1 TABLET(40 MG) BY MOUTH DAILY    gabapentin (NEURONTIN) 300 MG capsule TAKE 1 CAPSULE(300 MG) BY MOUTH THREE TIMES DAILY    HYDROcodone-acetaminophen (NORCO/VICODIN) 5-325 MG tablet Take 1 tablet by mouth 2 (two) times daily as needed for moderate pain.    insulin glargine (LANTUS) 100 UNIT/ML injection Inject 40 Units into the skin every morning.    metFORMIN (GLUCOPHAGE) 500 MG tablet Take 500 mg by mouth 2 (two) times daily with a meal.    metoprolol succinate (TOPROL-XL) 100 MG 24 hr tablet Take 1 tablet (100 mg total) by mouth daily. Take with or immediately following a meal.    nitroGLYCERIN (NITROSTAT) 0.4 MG SL tablet Place 0.4 mg under the tongue every 5 (five) minutes as needed for chest pain (x 3 doses). 02/15/2020: As needed   pantoprazole (PROTONIX) 40 MG tablet TAKE 1 TABLET BY MOUTH DAILY AT NOON    potassium chloride SA (KLOR-CON) 20 MEQ tablet Take 1 tablet every other day    PRECISION XTRA TEST STRIPS test strip USE TO TEST BLOOD SUGAR 1 TO 2 TIMES DAILY    tamsulosin (FLOMAX) 0.4  MG CAPS capsule Take 0.4 mg by mouth every Tuesday, Thursday, Saturday, and Sunday.    No facility-administered encounter medications on file as of 09/08/2020.   Recent Relevant Labs: Lab Results  Component Value Date/Time   HGBA1C 7.8 (A) 08/04/2020 11:33 AM   HGBA1C 8.1 (H) 05/26/2020 10:11 AM   HGBA1C 7.5 (A) 03/05/2020 11:18 AM   HGBA1C 8.4 11/21/2019 12:00 AM   HGBA1C 9.0 02/09/2018 12:00 AM   MICROALBUR <0.7 03/14/2019 08:37 AM   MICROALBUR 3.5 (H) 08/01/2017 09:40 AM    Kidney Function Lab Results  Component Value  Date/Time   CREATININE 1.24 08/04/2020 12:01 PM   CREATININE 1.72 (H) 07/03/2020 10:06 AM   CREATININE 1.08 03/18/2015 11:31 AM   CREATININE 1.27 (H) 01/06/2015 10:26 AM   GFR 51.29 (L) 08/04/2020 12:01 PM   GFRNONAA 49 (L) 01/17/2020 11:14 AM   GFRAA 57 (L) 01/17/2020 11:14 AM   I spoke with Mr. [name] this morning and he is doing well. He is only experiencing "tiredness" in his legs when walking, but he just attributes this to old age. He receive a steroid shot for his hip pain but stated that this round did not alleviate as much pain as it did the last time. His metoprolol was decreased recently along with the increase of his Jardiance  and he stated that he is not experiencing any side effects from theses changes. Overall  Mr. [name] has no complaints or concerns for his health.   Current antihyperglycemic regimen:  Jardiance 25 mg- take one tab daily  Metformin 500 mg- take one tab twice daily  Lantus 100 unit/ml- 40 units every morning   What recent interventions/DTPs have been made to improve glycemic control:  09/03/20- Peter Martinique, MD (Cardiology) increased empagliflozin from 10 mg daily to 25 mg daily  Have there been any recent hospitalizations or ED visits since last visit with [facility]PP? No  Patient denies hypoglycemic symptoms Patient denies hyperglycemic symptoms  How often are you checking your blood sugar?  Patient stated he checks his BS twice daily   What are your blood sugars ranging?  Fasting: 90-110,115 Before meals: n/a After meals: n/a Bedtime: 170,180-190,200  During the week, how often does your blood glucose drop below 70? Never  Are you checking your feet daily/regularly?  Patient stated his foot care is followed by podiatry and he has no complaints   Adherence Review: Is the patient currently on a STATIN medication? Yes Is the patient currently on ACE/ARB medication? No Does the patient have >5 day gap between last estimated fill dates? No fill Hx  Star  Rating Drugs: Atorvastatin 40 mg- No fill hx   CPP phone visit scheduled for 08/23 at 4 pm

## 2020-10-09 ENCOUNTER — Ambulatory Visit (INDEPENDENT_AMBULATORY_CARE_PROVIDER_SITE_OTHER)
Admission: RE | Admit: 2020-10-09 | Discharge: 2020-10-09 | Disposition: A | Discharge status: Home / Self Care | Payer: Medicare Other | Source: Ambulatory Visit | Attending: Family Medicine | Admitting: Family Medicine

## 2020-10-09 ENCOUNTER — Encounter: Payer: Self-pay | Admitting: Family Medicine

## 2020-10-09 ENCOUNTER — Other Ambulatory Visit: Payer: Self-pay

## 2020-10-09 ENCOUNTER — Ambulatory Visit (INDEPENDENT_AMBULATORY_CARE_PROVIDER_SITE_OTHER): Payer: Medicare Other | Admitting: Family Medicine

## 2020-10-09 ENCOUNTER — Other Ambulatory Visit: Payer: Self-pay | Admitting: *Deleted

## 2020-10-09 VITALS — BP 131/75 | HR 89 | Temp 98.2°F | Ht 62.0 in

## 2020-10-09 DIAGNOSIS — M79641 Pain in right hand: Secondary | ICD-10-CM | POA: Diagnosis not present

## 2020-10-09 DIAGNOSIS — M25531 Pain in right wrist: Secondary | ICD-10-CM | POA: Diagnosis not present

## 2020-10-09 NOTE — Patient Instructions (Signed)
Please follow up as scheduled for your next visit with me: 11/07/2020   Start icing the wrist 2-3x/day for 10-15 minutes at at a time. Wear the wrist splint for comfort as needed. You may use advil 2 tablets 1-3x/day IF needed for pain. Take with food and do not take for longer than 2-3 days. You can also use tylenol for pain.   If you have any questions or concerns, please don't hesitate to send me a message via MyChart or call the office at [phone]. Thank you for visiting with [place] today! It's our pleasure caring for you.

## 2020-10-11 NOTE — Progress Notes (Signed)
Subjective  CC:  Chief Complaint  Patient presents with   Wrist Pain   Hand Pain    HPI: [name] is a 87 y.o. male who presents to the office today to address the problems listed above in the chief complaint. Awoke with right wrist and hand pain last night. No injuyry or fall. Pain is quite diffuse. Hurts with movement of the wrist and fingers. Can't make a fist. Wearing a wrist splint that is helpful. Didn't take anything for pain. No h/o gout. No f/c/ or systemic symptoms.    Assessment  1. Pain of right hand   2. Right wrist pain      Plan  Right wrist / hand pain:  ? OA vs gout vs tendonititis. Check xrays. Wear splint for support. Start icing tid and use bid advil for 2-3 days only. F/ u if worsens.  Follow up: as scheduled  10/28/2020  No orders of the defined types were placed in this encounter.  No orders of the defined types were placed in this encounter.     I reviewed the patients updated PMH, FH, and SocHx.    Patient Active Problem List   Diagnosis Date Noted   Moderate nonproliferative diabetic retinopathy of left eye ([place]) 06/18/2020    Priority: High   Stage 3b chronic kidney disease ([place]) 05/26/2020    Priority: High   Aortic stenosis, moderate 05/22/2019    Priority: High   Chronic diastolic CHF (congestive heart failure) ([place]) 03/14/2019    Priority: High   Spinal stenosis of lumbar region 02/21/2018    Priority: High   Diabetic peripheral neuropathy associated with type 2 diabetes mellitus ([place]) 05/05/2017    Priority: High   Atherosclerotic heart disease of native coronary artery without angina pectoris 06/13/2016    Priority: High   CVA (cerebral vascular accident) ([place]) 06/06/2016    Priority: High   Recurrent coronary arteriosclerosis after percutaneous transluminal coronary angioplasty 04/18/2015    Priority: High   CAD S/P PCI- Nov 2016     Priority: High   Current use of long term anticoagulation 02/06/2013    Priority: High    Chronic atrial fibrillation ([place]) 07/30/2011    Priority: High   Hx of CABG x 2 1990     Priority: High   Essential (primary) hypertension     Priority: High   Diabetes mellitus type 2, insulin dependent (HCC)     Priority: High   Mixed hyperlipidemia     Priority: High   Thoracic aortic aneurysm without rupture ([place]) 05/26/2020    Priority: Medium   Degeneration of lumbar intervertebral disc 02/21/2018    Priority: Medium   Chronic vertigo 03/15/2014    Priority: Medium   Obesity (BMI 30.0-34.9) 01/31/2012    Priority: Medium   Enlarged prostate without lower urinary tract symptoms (luts) 01/04/2011    Priority: Medium   Gastro-esophageal reflux disease without esophagitis 12/07/2010    Priority: Medium   Epistaxis, recurrent 04/11/2015    Priority: Low   Osteoarthritis, knee 01/31/2012    Priority: Low   Allergic rhinitis 10/28/2011    Priority: Low   Hearing loss 06/08/2011    Priority: Low   Primary osteoarthritis of hand 06/08/2011    Priority: Low   Benign hematuria 08/27/2008    Priority: Low   Stable hemispheric central retinal vein occlusion (CRVO) of right eye 07/21/2020   Lower extremity edema 03/14/2019   Current Meds  Medication Sig   apixaban (ELIQUIS)  5 MG TABS tablet Take 1 tablet (5 mg total) by mouth 2 (two) times daily.   atorvastatin (LIPITOR) 40 MG tablet Take 1 tablet (40 mg total) by mouth daily at 6 PM.   AVODART 0.5 MG capsule Take 0.5 mg by mouth every Monday, Wednesday, and Friday.    Cholecalciferol 50 MCG (2000 UT) TABS Take 1 tablet by mouth daily.   diltiazem (TIAZAC) 360 MG 24 hr capsule Take 1 capsule by mouth daily.   empagliflozin (JARDIANCE) 25 MG TABS tablet Take 1 tablet (25 mg total) by mouth daily before breakfast.   fluconazole (DIFLUCAN) 150 MG tablet Take 1 tablet (150 mg total) by mouth daily.   fluticasone (FLONASE) 50 MCG/ACT nasal spray SHAKE LIQUID AND USE 1 SPRAY IN EACH NOSTRIL DAILY   furosemide (LASIX) 40 MG tablet  TAKE 1 TABLET(40 MG) BY MOUTH DAILY   gabapentin (NEURONTIN) 300 MG capsule TAKE 1 CAPSULE(300 MG) BY MOUTH THREE TIMES DAILY   HYDROcodone-acetaminophen (NORCO/VICODIN) 5-325 MG tablet Take 1 tablet by mouth 2 (two) times daily as needed for moderate pain.   insulin glargine (LANTUS) 100 UNIT/ML injection Inject 40 Units into the skin every morning.   metFORMIN (GLUCOPHAGE) 500 MG tablet Take 500 mg by mouth 2 (two) times daily with a meal.   metoprolol succinate (TOPROL-XL) 100 MG 24 hr tablet Take 1 tablet (100 mg total) by mouth daily. Take with or immediately following a meal.   nitroGLYCERIN (NITROSTAT) 0.4 MG SL tablet Place 0.4 mg under the tongue every 5 (five) minutes as needed for chest pain (x 3 doses).   pantoprazole (PROTONIX) 40 MG tablet TAKE 1 TABLET BY MOUTH DAILY AT NOON   potassium chloride SA (KLOR-CON) 20 MEQ tablet Take 1 tablet every other day   PRECISION XTRA TEST STRIPS test strip USE TO TEST BLOOD SUGAR 1 TO 2 TIMES DAILY   tamsulosin (FLOMAX) 0.4 MG CAPS capsule Take 0.4 mg by mouth every Tuesday, Thursday, Saturday, and Sunday.    Allergies: Patient is allergic to erythromycin base, septra [sulfamethoxazole-trimethoprim], cefadroxil, ciprofloxacin, and erythromycin. Family History: Patient family history includes Brain cancer in his father; Throat cancer in his brother. Social History:  Patient  reports that he quit smoking about 62 years ago. His smoking use included cigarettes. He has a 30.00 pack-year smoking history. He has never used smokeless tobacco. He reports that he does not drink alcohol and does not use drugs.  Review of Systems: Constitutional: Negative for fever malaise or anorexia Cardiovascular: negative for chest pain Respiratory: negative for SOB or persistent cough Gastrointestinal: negative for abdominal pain  Objective  Vitals: BP 131/75   Pulse 89   Temp 98.2 F (36.8 C) (Temporal)   Ht '5\' 2"'$  (1.575 m)   SpO2 97%   BMI 37.49 kg/m   General: no acute distress , A&Ox3 Right hand and wrist: normal appearing. No warmth or erythema. Tender over dorsal wrist diffusely. No joint ttp. No pain with mvt of fingers but painful wrist mvts.     Commons side effects, risks, benefits, and alternatives for medications and treatment plan prescribed today were discussed, and the patient expressed understanding of the given instructions. Patient is instructed to call or message via MyChart if he/she has any questions or concerns regarding our treatment plan. No barriers to understanding were identified. We discussed Red Flag symptoms and signs in detail. Patient expressed understanding regarding what to do in case of urgent or emergency type symptoms.  Medication list was reconciled, printed  and provided to the patient in AVS. Patient instructions and summary information was reviewed with the patient as documented in the AVS. This note was prepared with assistance of Dragon voice recognition software. Occasional wrong-word or sound-a-like substitutions may have occurred due to the inherent limitations of voice recognition software  This visit occurred during the SARS-CoV-2 public health emergency.  Safety protocols were in place, including screening questions prior to the visit, additional usage of staff PPE, and extensive cleaning of exam room while observing appropriate contact time as indicated for disinfecting solutions.

## 2020-10-28 ENCOUNTER — Ambulatory Visit (INDEPENDENT_AMBULATORY_CARE_PROVIDER_SITE_OTHER): Payer: Medicare Other | Admitting: Pharmacist

## 2020-10-28 DIAGNOSIS — Z794 Long term (current) use of insulin: Secondary | ICD-10-CM

## 2020-10-28 DIAGNOSIS — I10 Essential (primary) hypertension: Secondary | ICD-10-CM

## 2020-10-28 DIAGNOSIS — E119 Type 2 diabetes mellitus without complications: Secondary | ICD-10-CM | POA: Diagnosis not present

## 2020-10-28 NOTE — Progress Notes (Signed)
Chronic Care Management Pharmacy Note  10/29/2020 Name:  [name] MRN:  [id number] DOB:  [birth year]  Summary: PharmD follow up visit.  Patient reports doing well overall no concerns with medications.  Chronic diseases all appear to be well controlled for his age.  Recommendations/Changes made from today's visit: N/A  Plan: FU 6 months   Subjective: [name] is an 87 y.o. year old male who is a primary patient of [name], MD.  The CCM team was consulted for assistance with disease management and care coordination needs.    Engaged with patient by telephone for follow up visit in response to provider referral for pharmacy case management and/or care coordination services.   Consent to Services:  The patient was given the following information about Chronic Care Management services today, agreed to services, and gave verbal consent: 1. CCM service includes personalized support from designated clinical staff supervised by the primary care provider, including individualized plan of care and coordination with other care providers 2. 24/7 contact phone numbers for assistance for urgent and routine care needs. 3. Service will only be billed when office clinical staff spend 20 minutes or more in a month to coordinate care. 4. Only one practitioner may furnish and bill the service in a calendar month. 5.The patient may stop CCM services at any time (effective at the end of the month) by phone call to the office staff. 6. The patient will be responsible for cost sharing (co-pay) of up to 20% of the service fee (after annual deductible is met). Patient agreed to services and consent obtained.  Patient Care Team: [name], MD as PCP - General (Family Medicine) Martinique, Peter M, MD as PCP - Cardiology (Cardiology) [place]ique, Peter M, MD as Consulting Physician (Cardiology) Gardiner Barefoot, DPM as Consulting Physician (Podiatry) [name] Jessy Oto, MD as Consulting Physician  (Vascular Surgery) Melida Quitter, MD as Consulting Physician (Otolaryngology) Zadie Rhine Clent Demark, MD as Consulting Physician (Ophthalmology) [place] as Consulting Physician ([place]) [place], [place] (Neurosurgery) Edythe Clarity, [facility] (Pharmacist)  Objective:  Lab Results  Component Value Date   CREATININE 1.24 08/04/2020   BUN 20 08/04/2020   GFR 51.29 (L) 08/04/2020   GFRNONAA 49 (L) 01/17/2020   GFRAA 57 (L) 01/17/2020   NA 139 08/04/2020   K 4.8 08/04/2020   CALCIUM 9.2 08/04/2020   CO2 31 08/04/2020   GLUCOSE 198 (H) 08/04/2020    Lab Results  Component Value Date/Time   HGBA1C 7.8 (A) 08/04/2020 11:33 AM   HGBA1C 8.1 (H) 05/26/2020 10:11 AM   HGBA1C 7.5 (A) 03/05/2020 11:18 AM   HGBA1C 8.4 11/21/2019 12:00 AM   HGBA1C 9.0 02/09/2018 12:00 AM   GFR 51.29 (L) 08/04/2020 12:01 PM   GFR 34.66 (L) 07/03/2020 10:06 AM   MICROALBUR <0.7 03/14/2019 08:37 AM   MICROALBUR 3.5 (H) 08/01/2017 09:40 AM    Last diabetic Eye exam:  Lab Results  Component Value Date/Time   HMDIABEYEEXA No Retinopathy 10/11/2018 12:00 AM    Last diabetic Foot exam: No results found for: HMDIABFOOTEX   Lab Results  Component Value Date   CHOL 154 08/04/2020   HDL 46.10 08/04/2020   LDLCALC 90 08/04/2020   TRIG 86.0 08/04/2020   CHOLHDL 3 08/04/2020    Hepatic Function Latest Ref Rng & Units 08/04/2020 07/03/2020 05/26/2020  Total Protein 6.0 - 8.3 g/dL - 6.5 -  Albumin 3.5 - 5.2 g/dL 4.2 4.0 4.4  AST 0 -  37 U/L - 15 -  ALT 0 - 53 U/L - 13 -  Alk Phosphatase 39 - 117 U/L - 69 -  Total Bilirubin 0.2 - 1.2 mg/dL - 0.6 -  Bilirubin, Direct 0.01 - 0.4 - - -    Lab Results  Component Value Date/Time   TSH 2.06 11/21/2019 12:00 AM   TSH 2.22 08/13/2019 11:54 AM   TSH 1.57 10/04/2018 02:50 PM    CBC Latest Ref Rng & Units 07/03/2020 05/26/2020 08/13/2019  WBC 4.0 - 10.5 K/uL 10.7(H) 14.2(H) 8.2  Hemoglobin 13.0 - 17.0 g/dL 11.3(L) 10.6(L)  11.7(L)  Hematocrit 39.0 - 52.0 % 34.9(L) 33.5(L) 36.5(L)  Platelets 150.0 - 400.0 K/uL 275.0 272.0 283.0    Lab Results  Component Value Date/Time   VD25OH 39.69 11/21/2019 12:00 AM    Clinical ASCVD: No  The ASCVD Risk score Mikey Bussing DC Jr., et al., 2013) failed to calculate for the following reasons:   The 2013 ASCVD risk score is only valid for ages 44 to 76   The patient has a prior MI or stroke diagnosis    Depression screen [address] 2/9 10/09/2020 02/15/2020 08/13/2019  Decreased Interest 0 0 0  Down, Depressed, Hopeless 0 0 0  PHQ - 2 Score 0 0 0  Some recent data might be hidden     Social History   Tobacco Use  Smoking Status Former   Packs/day: 3.00   Years: 10.00   Pack years: 30.00   Types: Cigarettes   Quit date: 05/11/1958   Years since quitting: 62.5  Smokeless Tobacco Never   BP Readings from Last 3 Encounters:  10/09/20 131/75  09/03/20 130/62  08/04/20 130/76   Pulse Readings from Last 3 Encounters:  10/09/20 89  09/03/20 (!) 48  08/04/20 81   Wt Readings from Last 3 Encounters:  09/03/20 205 lb (93 kg)  08/04/20 208 lb 6.4 oz (94.5 kg)  07/03/20 209 lb (94.8 kg)   BMI Readings from Last 3 Encounters:  10/09/20 37.49 kg/m  09/03/20 37.49 kg/m  08/04/20 38.12 kg/m    Assessment/Interventions: Review of patient past medical history, allergies, medications, health status, including review of consultants reports, laboratory and other test data, was performed as part of comprehensive evaluation and provision of chronic care management services.   SDOH:  (Social Determinants of Health) assessments and interventions performed: Yes  SDOH Screenings   Alcohol Screen: Not on file  Depression (PHQ2-9): Low Risk    PHQ-2 Score: 0  Financial Resource Strain: Low Risk    Difficulty of Paying Living Expenses: Not hard at all  Food Insecurity: No Food Insecurity   Worried About Charity fundraiser in the Last Year: Never true   Ran Out of Food in the  Last Year: Never true  Housing: Low Risk    Last Housing Risk Score: 0  Physical Activity: Inactive   Days of Exercise per Week: 0 days   Minutes of Exercise per Session: 0 min  Social Connections: Moderately Isolated   Frequency of Communication with Friends and Family: Twice a week   Frequency of Social Gatherings with Friends and Family: Three times a week   Attends Religious Services: Never   Active Member of Clubs or Organizations: No   Attends Archivist Meetings: Never   Marital Status: Married  Stress: No Stress Concern Present   Feeling of Stress : Not at all  Tobacco Use: Medium Risk   Smoking Tobacco Use: Former   Smokeless  Tobacco Use: Never  Transportation Needs: No Transportation Needs   Lack of Transportation (Medical): No   Lack of Transportation (Non-Medical): No    CCM Care Plan  Allergies  Allergen Reactions   Erythromycin Base    Septra [Sulfamethoxazole-Trimethoprim] Itching   Cefadroxil Other (See Comments)    unknown   Ciprofloxacin Other (See Comments)    Unknown Unknown   Erythromycin Other (See Comments)    Upset stomach Upset stomach    Medications Reviewed Today     Reviewed by Edythe Clarity, [facility] (Pharmacist) on 10/29/20 at 2200  Med List Status: <None>   Medication Order Taking? Sig Documenting Provider Last Dose Status Informant  apixaban (ELIQUIS) 5 MG TABS tablet [id number] Yes Take 1 tablet (5 mg total) by mouth 2 (two) times daily. Reyne Dumas, MD Taking Active Self  atorvastatin (LIPITOR) 40 MG tablet [id number] Yes Take 1 tablet (40 mg total) by mouth daily at 6 PM. Reyne Dumas, MD Taking Active Self  AVODART 0.5 MG capsule [id number] Yes Take 0.5 mg by mouth every Monday, Wednesday, and Friday.  [provider] Taking Active Self  Cholecalciferol 50 MCG (2000 UT) TABS [id number] Yes Take 1 tablet by mouth daily. [provider] Taking Active   diltiazem (TIAZAC) 360 MG 24 hr capsule [id number] Yes Take  1 capsule by mouth daily. [provider] Taking Active   empagliflozin (JARDIANCE) 25 MG TABS tablet [id number] Yes Take 1 tablet (25 mg total) by mouth daily before breakfast. Martinique, Peter M, MD Taking Active   fluconazole (DIFLUCAN) 150 MG tablet [id number] Yes Take 1 tablet (150 mg total) by mouth daily. [name], MD Taking Active   fluticasone [facility]) 50 MCG/ACT nasal spray [id number] Yes SHAKE LIQUID AND USE 1 SPRAY IN EACH NOSTRIL DAILY [name], MD Taking Active   furosemide (LASIX) 40 MG tablet [id number] Yes TAKE 1 TABLET(40 MG) BY MOUTH DAILY Martinique, Peter M, MD Taking Active   gabapentin (NEURONTIN) 300 MG capsule [id number] Yes TAKE 1 CAPSULE(300 MG) BY MOUTH THREE TIMES DAILY [name], MD Taking Active   HYDROcodone-acetaminophen (NORCO/VICODIN) 5-325 MG tablet [id number] Yes Take 1 tablet by mouth 2 (two) times daily as needed for moderate pain. [name], MD Taking Active   insulin glargine (LANTUS) 100 UNIT/ML injection [id number] Yes Inject 40 Units into the skin every morning. [provider] Taking Active Self  metFORMIN (GLUCOPHAGE) 500 MG tablet [id number] Yes Take 500 mg by mouth 2 (two) times daily with a meal. [provider] Taking Active Self  metoprolol succinate (TOPROL-XL) 100 MG 24 hr tablet [id number] Yes Take 1 tablet (100 mg total) by mouth daily. Take with or immediately following a meal. Martinique, Peter M, MD Taking Active   nitroGLYCERIN (NITROSTAT) 0.4 MG SL tablet [id number] Yes Place 0.4 mg under the tongue every 5 (five) minutes as needed for chest pain (x 3 doses). [provider] Taking Active            Med Note Willette Brace   Fri Feb 15, 2020  1:37 PM) As needed  pantoprazole (PROTONIX) 40 MG tablet [id number] Yes TAKE 1 TABLET BY MOUTH DAILY AT NOON Martinique, Ander Slade, MD Taking Active   potassium chloride SA (KLOR-CON) 20 MEQ tablet [id number] Yes Take 1 tablet every other day Martinique, Peter M, MD  Taking Active   PRECISION XTRA TEST STRIPS test strip [id number] Yes USE TO TEST BLOOD SUGAR 1 TO 2 TIMES DAILY [name],  MD Taking Active   tamsulosin (FLOMAX) 0.4 MG CAPS capsule [id number] Yes Take 0.4 mg by mouth every Tuesday, Thursday, Saturday, and Sunday. [provider] Taking Active Self            Patient Active Problem List   Diagnosis Date Noted   Stable hemispheric central retinal vein occlusion (CRVO) of right eye 07/21/2020   Moderate nonproliferative diabetic retinopathy of left eye ([place]) 06/18/2020   Thoracic aortic aneurysm without rupture ([place]) 05/26/2020   Stage 3b chronic kidney disease ([place]) 05/26/2020   Aortic stenosis, moderate 05/22/2019   Chronic diastolic CHF (congestive heart failure) ([place]) 03/14/2019   Lower extremity edema 03/14/2019   Spinal stenosis of lumbar region 02/21/2018   Degeneration of lumbar intervertebral disc 02/21/2018   Diabetic peripheral neuropathy associated with type 2 diabetes mellitus ([place]) 05/05/2017   Atherosclerotic heart disease of native coronary artery without angina pectoris 06/13/2016   CVA (cerebral vascular accident) ([place]) 06/06/2016   Recurrent coronary arteriosclerosis after percutaneous transluminal coronary angioplasty 04/18/2015   Epistaxis, recurrent 04/11/2015   CAD S/P PCI- Nov 2016    Chronic vertigo 03/15/2014   Current use of long term anticoagulation 02/06/2013   Obesity (BMI 30.0-34.9) 01/31/2012   Osteoarthritis, knee 01/31/2012   Allergic rhinitis 10/28/2011   Chronic atrial fibrillation ([place]) 07/30/2011   Hearing loss 06/08/2011   Primary osteoarthritis of hand 06/08/2011   Enlarged prostate without lower urinary tract symptoms (luts) 01/04/2011   Gastro-esophageal reflux disease without esophagitis 12/07/2010   Hx of CABG x 2 1990    Essential (primary) hypertension    Diabetes mellitus type 2, insulin dependent ([place])    Mixed hyperlipidemia    Benign hematuria 08/27/2008     Immunization History  Administered Date(s) Administered   Fluad Quad(high Dose 65+) 11/02/2018, 11/30/2019   Influenza Split 11/14/2007, 01/01/2009, 12/23/2009, 11/29/2012, 10/31/2015   Influenza, High Dose Seasonal PF 11/02/2012, 12/12/2013, 11/15/2014, 11/24/2017, 01/17/2018   Influenza, Seasonal, Injecte, Preservative Fre 11/18/2015   Influenza,inj,Quad PF,6+ Mos 02/15/2017   Influenza,trivalent, recombinat, inj, PF 12/07/2010   Influenza-Unspecified 10/28/2011, 11/30/2014, 02/15/2017   PFIZER(Purple Top)SARS-COV-2 Vaccination 03/23/2019, 04/14/2019, 12/06/2019   Pneumococcal Conjugate-13 12/13/2013   Pneumococcal-Unspecified 08/20/2008   Td 05/29/2002   Tdap 10/28/2011, 02/15/2017   Zoster Recombinat (Shingrix) 09/16/2016, 10/13/2016, 12/13/2016, 01/11/2017   Zoster, Live 08/20/2008    Conditions to be addressed/monitored:  Hypertension and Diabetes  Care Plan : General Pharmacy (Adult)  Updates made by Edythe Clarity, RPH since 10/29/2020 12:00 AM     Problem: HTN, DM   Priority: High  Onset Date: 10/29/2020     Long-Range Goal: Patient-Specific Goal   Start Date: 10/29/2020  Expected End Date: 04/28/2021  This Visit's Progress: On track  Priority: High  Note:   Current Barriers:  None at  this time  Pharmacist Clinical Goal(s):  Patient will maintain control of glucose and BP as evidenced by home monitoring  through collaboration with PharmD and provider.   Interventions: 1:1 collaboration with [name], MD regarding development and update of comprehensive plan of care as evidenced by provider attestation and co-signature Inter-disciplinary care team collaboration (see longitudinal plan of care) Comprehensive medication review performed; medication list updated in electronic medical record  Hypertension (BP goal <140/90) -Controlled -Current treatment: Diltiazem 370m 24hr daily Toprolol XL 1036mdaily  -Medications previously tried: none noted   -Current home readings: "WNL" per patient  -Denies hypotensive/hypertensive symptoms -Educated on BP goals and benefits of medications for prevention of heart attack, stroke  and kidney damage; Importance of home blood pressure monitoring; Symptoms of hypotension and importance of maintaining adequate hydration; -Counseled to monitor BP at home as able, document, and provide log at future appointments -Recommended to continue current medication  Diabetes (A1c goal <8%) -Controlled -Current medications: Empagliflozin 59m once daily Metformin 500 mg twice daily with meal  Insulin glargine (Lantus) 40 units injected into skin every morning -Medications previously tried: none noted  -Current home glucose readings fasting glucose: 95-115 post prandial glucose: 160-170 -Denies hypoglycemic/hyperglycemic symptoms -Educated on A1c and blood sugar goals; -Counseled to check feet daily and get yearly eye exams -Fasting glucose and post prandial all at goal -Patient with no concerns at this time -Recommended to continue current medication  Patient Goals/Self-Care Activities Patient will:  - take medications as prescribed check glucose periodically, document, and provide at future appointments  Follow Up Plan: The care management team will reach out to the patient again over the next 180 days.         Medication Assistance: None required.  Patient affirms current coverage meets needs.  Compliance/Adherence/Medication fill history: Care Gaps: None  Star-Rating Drugs:  [facility]  [facility] - [address] [address]Phone: [phone]: [phone] Uses pill box? Yes Pt endorses 100% compliance  We discussed: Benefits of medication synchronization, packaging and delivery as well as enhanced pharmacist oversight with [employer]. Patient  decided to: Continue current medication management strategy  Care Plan and Follow Up Patient Decision:  Patient agrees to Care Plan and Follow-up.  Plan: The care management team will reach out to the patient again over the next 180 days.  CBeverly Milch PharmD Clinical Pharmacist  [phone]

## 2020-10-29 NOTE — Patient Instructions (Addendum)
Visit Information   Goals Addressed             This Visit's Progress    Monitor and Manage My Blood Sugar-Diabetes Type 2       Timeframe:  Long-Range Goal Priority:  High Start Date:    10/29/20                         Expected End Date:   02/31/23                    Follow Up Date 01/28/21    - check blood sugar at prescribed times - check blood sugar if I feel it is too high or too low - take the blood sugar log to all doctor visits    Why is this important?   Checking your blood sugar at home helps to keep it from getting very high or very low.  Writing the results in a diary or log helps the doctor know how to care for you.  Your blood sugar log should have the time, date and the results.  Also, write down the amount of insulin or other medicine that you take.  Other information, like what you ate, exercise done and how you were feeling, will also be helpful.     Notes:        Patient Care Plan: General Pharmacy (Adult)     Problem Identified: HTN, DM   Priority: High  Onset Date: 10/29/2020     Long-Range Goal: Patient-Specific Goal   Start Date: 10/29/2020  Expected End Date: 04/28/2021  This Visit's Progress: On track  Priority: High  Note:   Current Barriers:  None at  this time  Pharmacist Clinical Goal(s):  Patient will maintain control of glucose and BP as evidenced by home monitoring  through collaboration with PharmD and provider.   Interventions: 1:1 collaboration with [name], MD regarding development and update of comprehensive plan of care as evidenced by provider attestation and co-signature Inter-disciplinary care team collaboration (see longitudinal plan of care) Comprehensive medication review performed; medication list updated in electronic medical record  Hypertension (BP goal <140/90) -Controlled -Current treatment: Diltiazem '360mg'$  24hr daily Toprolol XL '100mg'$  daily  -Medications previously tried: none noted  -Current home  readings: "WNL" per patient  -Denies hypotensive/hypertensive symptoms -Educated on BP goals and benefits of medications for prevention of heart attack, stroke and kidney damage; Importance of home blood pressure monitoring; Symptoms of hypotension and importance of maintaining adequate hydration; -Counseled to monitor BP at home as able, document, and provide log at future appointments -Recommended to continue current medication  Diabetes (A1c goal <8%) -Controlled -Current medications: Empagliflozin '25mg'$  once daily Metformin 500 mg twice daily with meal  Insulin glargine (Lantus) 40 units injected into skin every morning -Medications previously tried: none noted  -Current home glucose readings fasting glucose: 95-115 post prandial glucose: 160-170 -Denies hypoglycemic/hyperglycemic symptoms -Educated on A1c and blood sugar goals; -Counseled to check feet daily and get yearly eye exams -Fasting glucose and post prandial all at goal -Patient with no concerns at this time -Recommended to continue current medication  Patient Goals/Self-Care Activities Patient will:  - take medications as prescribed check glucose periodically, document, and provide at future appointments  Follow Up Plan: The care management team will reach out to the patient again over the next 180 days.         Patient verbalizes understanding of instructions provided today  and agrees to view in [place].  Telephone follow up appointment with pharmacy team member scheduled for: 6 months  Edythe Clarity, [place]

## 2020-11-07 ENCOUNTER — Other Ambulatory Visit: Payer: Self-pay

## 2020-11-07 ENCOUNTER — Ambulatory Visit (INDEPENDENT_AMBULATORY_CARE_PROVIDER_SITE_OTHER): Payer: Medicare Other | Admitting: Family Medicine

## 2020-11-07 ENCOUNTER — Encounter: Payer: Self-pay | Admitting: Family Medicine

## 2020-11-07 VITALS — BP 140/82 | HR 90 | Temp 98.3°F | Ht 62.0 in | Wt 203.2 lb

## 2020-11-07 DIAGNOSIS — M19042 Primary osteoarthritis, left hand: Secondary | ICD-10-CM

## 2020-11-07 DIAGNOSIS — E1121 Type 2 diabetes mellitus with diabetic nephropathy: Secondary | ICD-10-CM | POA: Diagnosis not present

## 2020-11-07 DIAGNOSIS — I482 Chronic atrial fibrillation, unspecified: Secondary | ICD-10-CM | POA: Diagnosis not present

## 2020-11-07 DIAGNOSIS — Z23 Encounter for immunization: Secondary | ICD-10-CM | POA: Diagnosis not present

## 2020-11-07 DIAGNOSIS — M19041 Primary osteoarthritis, right hand: Secondary | ICD-10-CM | POA: Diagnosis not present

## 2020-11-07 DIAGNOSIS — E1142 Type 2 diabetes mellitus with diabetic polyneuropathy: Secondary | ICD-10-CM | POA: Diagnosis not present

## 2020-11-07 DIAGNOSIS — Z794 Long term (current) use of insulin: Secondary | ICD-10-CM | POA: Diagnosis not present

## 2020-11-07 DIAGNOSIS — I10 Essential (primary) hypertension: Secondary | ICD-10-CM | POA: Diagnosis not present

## 2020-11-07 DIAGNOSIS — E113392 Type 2 diabetes mellitus with moderate nonproliferative diabetic retinopathy without macular edema, left eye: Secondary | ICD-10-CM

## 2020-11-07 DIAGNOSIS — M48062 Spinal stenosis, lumbar region with neurogenic claudication: Secondary | ICD-10-CM | POA: Diagnosis not present

## 2020-11-07 LAB — POCT GLYCOSYLATED HEMOGLOBIN (HGB A1C): Hemoglobin A1C: 7.9 % — AB (ref 4.0–5.6)

## 2020-11-07 NOTE — Progress Notes (Signed)
Subjective  CC:  Chief Complaint  Patient presents with   Diabetes   Hyperlipidemia   Constipation    Has improved    HPI: [name] is a 87 y.o. male who presents to the office today for follow up of diabetes and problems listed above in the chief complaint.  Diabetes follow up: His diabetic control is reported as Unchanged. Has been well controlled. He checks sugars and they can be a little high after meals intermittently but fastings typically are well controlled.  He denies exertional CP or SOB or symptomatic hypoglycemia. He denies foot sores and has chronic painless foot paresthesias.  HTN/paf/caf/chf: doing well on meds. No cp or sob. Reviewed cardiology notes from July, dr Martinique. Adjusted dose of heart rate control meds to avoid bradycardia and hypotension. Euvolemic and no sxs of ischemia C/o hand pain today. Has OA; 1st thumb joint moslty.  Leg pain repsponded well to last epidural steroid injection. Pain free today.   Wt Readings from Last 3 Encounters:  11/07/20 203 lb 3.2 oz (92.2 kg)  09/03/20 205 lb (93 kg)  08/04/20 208 lb 6.4 oz (94.5 kg)    BP Readings from Last 3 Encounters:  11/07/20 140/82  10/09/20 131/75  09/03/20 130/62    Assessment  1. Type 2 diabetes mellitus with diabetic nephropathy, with long-term current use of insulin ([place])   2. Chronic atrial fibrillation (HCC)   3. Diabetic peripheral neuropathy associated with type 2 diabetes mellitus ([place])   4. Moderate nonproliferative diabetic retinopathy of left eye without macular edema associated with type 2 diabetes mellitus ([place])   5. Essential (primary) hypertension   6. Primary osteoarthritis of hands, bilateral   7. Spinal stenosis of lumbar region with neurogenic claudication      Plan  Diabetes is currently well controlled. Goal a1c <8.0 given age and comorbitities. No chagne in medications. Continue jardiance 25 and lantus with bid metformin. CARD: stable. With good ldl and HR  control on blood thinners and diuretics. No changes today.  Bp is controlled.  OA: tylenol and voltaren gel qid recommended.  Spinal stenosis: continue gabapentin 300 tid and f/u with ortho intermittently as needed for injections.  Flu shot given today.  Follow up: 3 months for recheck. Orders Placed This Encounter  Procedures   POCT HgB A1C   No orders of the defined types were placed in this encounter.     Immunization History  Administered Date(s) Administered   Fluad Quad(high Dose 65+) 11/02/2018, 11/30/2019   Influenza Split 11/14/2007, 01/01/2009, 12/23/2009, 11/29/2012, 10/31/2015   Influenza, High Dose Seasonal PF 11/02/2012, 12/12/2013, 11/15/2014, 11/24/2017, 01/17/2018   Influenza, Seasonal, Injecte, Preservative Fre 11/18/2015   Influenza,inj,Quad PF,6+ Mos 02/15/2017   Influenza,trivalent, recombinat, inj, PF 12/07/2010   Influenza-Unspecified 10/28/2011, 11/30/2014, 02/15/2017   PFIZER(Purple Top)SARS-COV-2 Vaccination 03/23/2019, 04/14/2019, 12/06/2019, 06/21/2020   Pneumococcal Conjugate-13 12/13/2013   Pneumococcal-Unspecified 08/20/2008   Td 05/29/2002   Tdap 10/28/2011, 02/15/2017   Zoster Recombinat (Shingrix) 09/16/2016, 10/13/2016, 12/13/2016, 01/11/2017   Zoster, Live 08/20/2008    Diabetes Related Lab Review: Lab Results  Component Value Date   HGBA1C 7.9 (A) 11/07/2020   HGBA1C 7.8 (A) 08/04/2020   HGBA1C 8.1 (H) 05/26/2020    Lab Results  Component Value Date   MICROALBUR <0.7 03/14/2019   Lab Results  Component Value Date   CREATININE 1.24 08/04/2020   BUN 20 08/04/2020   NA 139 08/04/2020   K 4.8 08/04/2020   CL 101 08/04/2020  CO2 31 08/04/2020   Lab Results  Component Value Date   CHOL 154 08/04/2020   CHOL 147 08/13/2019   CHOL 143 10/04/2018   Lab Results  Component Value Date   HDL 46.10 08/04/2020   HDL 49.70 08/13/2019   HDL 44.60 10/04/2018   Lab Results  Component Value Date   LDLCALC 90 08/04/2020   LDLCALC 79  08/13/2019   LDLCALC 77 10/04/2018   Lab Results  Component Value Date   TRIG 86.0 08/04/2020   TRIG 93.0 08/13/2019   TRIG 106.0 10/04/2018   Lab Results  Component Value Date   CHOLHDL 3 08/04/2020   CHOLHDL 3 08/13/2019   CHOLHDL 3 10/04/2018   No results found for: LDLDIRECT The ASCVD Risk score (Arnett DK, et al., 2019) failed to calculate for the following reasons:   The 2019 ASCVD risk score is only valid for ages 17 to 28   The patient has a prior MI or stroke diagnosis I have reviewed the PMH, Fam and Soc history. Patient Active Problem List   Diagnosis Date Noted   Moderate nonproliferative diabetic retinopathy of left eye ([place]) 06/18/2020    Priority: High   Stage 3b chronic kidney disease ([place]) 05/26/2020    Priority: High   Aortic stenosis, moderate 05/22/2019    Priority: High    Stable echocardiogram 05/2020, Dr. Martinique    Chronic diastolic CHF (congestive heart failure) ([place]) 03/14/2019    Priority: High   Spinal stenosis of lumbar region 02/21/2018    Priority: High   Diabetic peripheral neuropathy associated with type 2 diabetes mellitus ([place]) 05/05/2017    Priority: High    No pain    Atherosclerotic heart disease of native coronary artery without angina pectoris 06/13/2016    Priority: High    Overview:  Overview:  RCA DES placed Nov 2016, LIMA-LAD presumed patent based on Myoview but no visualized, SVG-Dx occluded  Last Assessment & Plan:  RCA DES placed Nov 2016, LIMA-LAD presumed patent based on Myoview but no visualized, SVG-Dx occluded    CVA (cerebral vascular accident) ([place]) 06/06/2016    Priority: High   Recurrent coronary arteriosclerosis after percutaneous transluminal coronary angioplasty 04/18/2015    Priority: High    Overview:  Overview:  RCA DES placed Nov 2016, LIMA-LAD presumed patent based on Myoview but no visualized, SVG-Dx occluded  Last Assessment & Plan:  RCA DES placed Nov 2016, LIMA-LAD presumed patent based on  Myoview but no visualized, SVG-Dx occluded    CAD S/P PCI- Nov 2016     Priority: High    RCA DES placed Nov 2016, LIMA-LAD presumed patent based on Myoview but no visualized, SVG-Dx occluded    Current use of long term anticoagulation 02/06/2013    Priority: High    Overview:  Monitored by cardiology    Chronic atrial fibrillation ([place]) 07/30/2011    Priority: High    CHADs VASc=5 for age, HTN, vascular disease, and DM    Hx of CABG x 2 1990     Priority: High    Status post CABG x2 in 1990 including an LIMA graft to the LAD, and a vein graft to the diagonal.     Essential (primary) hypertension     Priority: High   Diabetes mellitus with diabetic nephropathy, with long-term current use of insulin ([place])     Priority: High    Neg urine microalbuminuria, not on ace.  On farxiga    Mixed hyperlipidemia  Priority: High   Thoracic aortic aneurysm without rupture ([place]) 05/26/2020    Priority: Medium    Followed by cards. Stable by CT 2022    Degeneration of lumbar intervertebral disc 02/21/2018    Priority: Medium   Chronic vertigo 03/15/2014    Priority: Medium   Obesity (BMI 30.0-34.9) 01/31/2012    Priority: Medium   Enlarged prostate without lower urinary tract symptoms (luts) 01/04/2011    Priority: Medium    Overview:  Urology - avodart and tamuloscin    Gastro-esophageal reflux disease without esophagitis 12/07/2010    Priority: Medium   Epistaxis, recurrent 04/11/2015    Priority: Low   Osteoarthritis, knee 01/31/2012    Priority: Low   Allergic rhinitis 10/28/2011    Priority: Low   Hearing loss 06/08/2011    Priority: Low   Primary osteoarthritis of hand 06/08/2011    Priority: Low   Benign hematuria 08/27/2008    Priority: Low    Essential Hematuria - urology - Risa Grill w/u benign     Stable hemispheric central retinal vein occlusion (CRVO) of right eye 07/21/2020   Lower extremity edema 03/14/2019    Social History: Patient  reports that he  quit smoking about 62 years ago. His smoking use included cigarettes. He has a 30.00 pack-year smoking history. He has never used smokeless tobacco. He reports that he does not drink alcohol and does not use drugs.  Review of Systems: Ophthalmic: negative for eye pain, loss of vision or double vision Cardiovascular: negative for chest pain Respiratory: negative for SOB or persistent cough Gastrointestinal: negative for abdominal pain Genitourinary: negative for dysuria or gross hematuria MSK: negative for foot lesions Neurologic: negative for weakness or gait disturbance  Objective  Vitals: BP 140/82   Pulse 90   Temp 98.3 F (36.8 C) (Temporal)   Ht '5\' 2"'$  (1.575 m)   Wt 203 lb 3.2 oz (92.2 kg)   SpO2 96%   BMI 37.17 kg/m  General: well appearing, no acute distress , looks good Psych:  Alert and oriented, normal mood and affect Cardiovascular:  Nl S1 and S2, irreg irreg with murmur, no edema Respiratory:  Good breath sounds bilaterally, CTAB with normal effort, no rales   Diabetic education: ongoing education regarding chronic disease management for diabetes was given today. We continue to reinforce the ABC's of diabetic management: A1c (<7 or 8 dependent upon patient), tight blood pressure control, and cholesterol management with goal LDL < 100 minimally. We discuss diet strategies, exercise recommendations, medication options and possible side effects. At each visit, we review recommended immunizations and preventive care recommendations for diabetics and stress that good diabetic control can prevent other problems. See below for this patient's data.   Commons side effects, risks, benefits, and alternatives for medications and treatment plan prescribed today were discussed, and the patient expressed understanding of the given instructions. Patient is instructed to call or message via MyChart if he/she has any questions or concerns regarding our treatment plan. No barriers to  understanding were identified. We discussed Red Flag symptoms and signs in detail. Patient expressed understanding regarding what to do in case of urgent or emergency type symptoms.  Medication list was reconciled, printed and provided to the patient in AVS. Patient instructions and summary information was reviewed with the patient as documented in the AVS. This note was prepared with assistance of Dragon voice recognition software. Occasional wrong-word or sound-a-like substitutions may have occurred due to the inherent limitations of voice recognition software  This visit occurred during the SARS-CoV-2 public health emergency.  Safety protocols were in place, including screening questions prior to the visit, additional usage of staff PPE, and extensive cleaning of exam room while observing appropriate contact time as indicated for disinfecting solutions.

## 2020-11-07 NOTE — Patient Instructions (Addendum)
Please return in 3 months for diabetes and HTN  follow up   Today you were given your flu vaccination.   If you have any questions or concerns, please don't hesitate to send me a message via MyChart or call the office at [phone]. Thank you for visiting with [place] today! It's our pleasure caring for you.

## 2020-11-14 ENCOUNTER — Telehealth: Payer: Self-pay | Admitting: Pharmacist

## 2020-11-14 NOTE — Progress Notes (Addendum)
Chronic Care Management Pharmacy Assistant   Name: [name]  MRN: [id number] DOB: 1928/06/26   Reason for Encounter: Diabetes Adherence Call    Recent office visits:  11/07/2020 OV (PCP) [name], MD; chronic follow up, no medication changes  Recent consult visits:  None  Hospital visits:  None  Medications: Outpatient Encounter Medications as of 11/14/2020  Medication Sig Note   apixaban (ELIQUIS) 5 MG TABS tablet Take 1 tablet (5 mg total) by mouth 2 (two) times daily.    atorvastatin (LIPITOR) 40 MG tablet Take 1 tablet (40 mg total) by mouth daily at 6 PM.    AVODART 0.5 MG capsule Take 0.5 mg by mouth every Monday, Wednesday, and Friday.     Cholecalciferol 50 MCG (2000 UT) TABS Take 1 tablet by mouth daily.    diltiazem (TIAZAC) 360 MG 24 hr capsule Take 1 capsule by mouth daily.    empagliflozin (JARDIANCE) 25 MG TABS tablet Take 1 tablet (25 mg total) by mouth daily before breakfast.    fluticasone (FLONASE) 50 MCG/ACT nasal spray SHAKE LIQUID AND USE 1 SPRAY IN EACH NOSTRIL DAILY    furosemide (LASIX) 40 MG tablet TAKE 1 TABLET(40 MG) BY MOUTH DAILY    gabapentin (NEURONTIN) 300 MG capsule TAKE 1 CAPSULE(300 MG) BY MOUTH THREE TIMES DAILY    HYDROcodone-acetaminophen (NORCO/VICODIN) 5-325 MG tablet Take 1 tablet by mouth 2 (two) times daily as needed for moderate pain.    insulin glargine (LANTUS) 100 UNIT/ML injection Inject 40 Units into the skin every morning.    metFORMIN (GLUCOPHAGE) 500 MG tablet Take 500 mg by mouth 2 (two) times daily with a meal.    metoprolol succinate (TOPROL-XL) 100 MG 24 hr tablet Take 1 tablet (100 mg total) by mouth daily. Take with or immediately following a meal.    nitroGLYCERIN (NITROSTAT) 0.4 MG SL tablet Place 0.4 mg under the tongue every 5 (five) minutes as needed for chest pain (x 3 doses). 02/15/2020: As needed   pantoprazole (PROTONIX) 40 MG tablet TAKE 1 TABLET BY MOUTH DAILY AT NOON    potassium chloride SA  (KLOR-CON) 20 MEQ tablet Take 1 tablet every other day    PRECISION XTRA TEST STRIPS test strip USE TO TEST BLOOD SUGAR 1 TO 2 TIMES DAILY    tamsulosin (FLOMAX) 0.4 MG CAPS capsule Take 0.4 mg by mouth every Tuesday, Thursday, Saturday, and Sunday.    No facility-administered encounter medications on file as of 11/14/2020.   Recent Relevant Labs: Lab Results  Component Value Date/Time   HGBA1C 7.9 (A) 11/07/2020 10:44 AM   HGBA1C 7.8 (A) 08/04/2020 11:33 AM   HGBA1C 8.1 (H) 05/26/2020 10:11 AM   HGBA1C 8.4 11/21/2019 12:00 AM   HGBA1C 9.0 02/09/2018 12:00 AM   MICROALBUR <0.7 03/14/2019 08:37 AM   MICROALBUR 3.5 (H) 08/01/2017 09:40 AM    Kidney Function Lab Results  Component Value Date/Time   CREATININE 1.24 08/04/2020 12:01 PM   CREATININE 1.72 (H) 07/03/2020 10:06 AM   CREATININE 1.08 03/18/2015 11:31 AM   CREATININE 1.27 (H) 01/06/2015 10:26 AM   GFR 51.29 (L) 08/04/2020 12:01 PM   GFRNONAA 49 (L) 01/17/2020 11:14 AM   GFRAA 57 (L) 01/17/2020 11:14 AM    Current antihyperglycemic regimen:  Jardiance 25 mg daily Metformin 500 mg twice daily  What recent interventions/DTPs have been made to improve glycemic control:  No recent interventions or DTPs.  Have there been any recent hospitalizations or ED visits since  last visit with CPP? No  Patient denies hypoglycemic symptoms.  Patient denies hyperglycemic symptoms.  How often are you checking your blood sugar? twice daily What are your blood sugars ranging?  Fasting: 90-120 Before meals: n/a After meals: 150-180 Bedtime: n/a  During the week, how often does your blood glucose drop below 70? Never  Are you checking your feet daily/regularly? Patient states he does check his feet regularly.  Adherence Review: Is the patient currently on a STATIN medication? Yes Is the patient currently on ACE/ARB medication? No Does the patient have >5 day gap between last estimated fill dates? No   Care Gaps: Medicare  Annual Wellness: last AWV 02/15/2020 Ophthalmology Exam: Next due on 07/21/2021 Zoster Vaccines- Shingrix: Completed COVID-19 Vaccination: Completed Foot Exam: Next due on 08/22/2021 PNA Vaccination: Completed Influenza Vaccination: Completed Hemoglobin A1C: 7.9% on 11/07/2020 Colonoscopy: Aged Out Tetanus/DTAP: Next due on 02/16/2027 HPV Vaccines: Aged Out  Future Appointments  Date Time Provider [place]  12/01/2020 10:30 AM Gardiner Barefoot, DPM [facility]  02/09/2021  9:30 AM [name], MD [facility]  02/20/2021  8:45 AM [facility] HEALTH COACH [facility]  04/27/2021  8:00 AM Rankin, Clent Demark, MD [facility] None     Star Rating Drugs: No fill dates available, patient receives these medication from [place] Jardiance 25 mg Metformin 500 mg  Atorvastatin 40 mg  April D Calhoun, [place] Pharmacist Assistant [phone]

## 2020-11-24 ENCOUNTER — Other Ambulatory Visit: Payer: Self-pay | Admitting: Family Medicine

## 2020-11-28 ENCOUNTER — Ambulatory Visit: Payer: Medicare Other | Admitting: Cardiology

## 2020-12-01 ENCOUNTER — Other Ambulatory Visit: Payer: Self-pay

## 2020-12-01 ENCOUNTER — Ambulatory Visit (INDEPENDENT_AMBULATORY_CARE_PROVIDER_SITE_OTHER): Payer: Medicare Other | Admitting: Podiatry

## 2020-12-01 ENCOUNTER — Encounter: Payer: Self-pay | Admitting: Podiatry

## 2020-12-01 DIAGNOSIS — B351 Tinea unguium: Secondary | ICD-10-CM

## 2020-12-01 DIAGNOSIS — M79674 Pain in right toe(s): Secondary | ICD-10-CM

## 2020-12-01 DIAGNOSIS — E1142 Type 2 diabetes mellitus with diabetic polyneuropathy: Secondary | ICD-10-CM

## 2020-12-01 DIAGNOSIS — D689 Coagulation defect, unspecified: Secondary | ICD-10-CM | POA: Diagnosis not present

## 2020-12-01 NOTE — Progress Notes (Signed)
This patient returns to my office for at risk foot care.  This patient requires this care by a professional since this patient will be at risk due to having diabetic neuropathy and coagulation defect.  Patient is taking eliquiss.  Patient says toenails are painful walking and wearing his shoes.  This patient presents for at risk foot care today.  General Appearance  Alert, conversant and in no acute stress.  Vascular  Dorsalis pedis and posterior tibial  pulses are weakly  palpable  bilaterally.  Capillary return is within normal limits  bilaterally. Temperature is within normal limits  bilaterally.  Neurologic  Senn-Weinstein monofilament wire test within normal limits  bilaterally. Muscle power within normal limits bilaterally.  Nails Thick disfigured discolored nails with subungual debris  from hallux to fifth toes bilaterally. No evidence of bacterial infection or drainage bilaterally.  Orthopedic  No limitations of motion  feet .  No crepitus or effusions noted.  No bony pathology or digital deformities noted.  Skin  normotropic skin with no porokeratosis noted bilaterally.  No signs of infections or ulcers noted.     Onychomycosis  B/L  Consent was obtained for treatment procedures.   Debridement of nails with nail nipper followed by dremel tool. Filed with dremel without incident.    Return office visit    12   weeks                Told patient to return for periodic foot care and evaluation due to potential at risk complications.   Hezekiah Veltre DPM  

## 2020-12-12 ENCOUNTER — Telehealth: Payer: Self-pay

## 2020-12-12 NOTE — Telephone Encounter (Signed)
Fyi.

## 2020-12-12 NOTE — Telephone Encounter (Signed)
Patient's wife called, [name] is scheduled for a 5th booster and was advised to contact PCP to see if it is appropriate to get it.

## 2020-12-15 NOTE — Telephone Encounter (Signed)
Patient notified that Dr. Jonni Sanger is ok for her and his wife to get the 5th booster Covid vaccine.

## 2020-12-22 ENCOUNTER — Other Ambulatory Visit: Payer: Self-pay | Admitting: Cardiology

## 2021-01-02 ENCOUNTER — Ambulatory Visit: Payer: Medicare Other | Attending: Internal Medicine

## 2021-01-02 ENCOUNTER — Other Ambulatory Visit: Payer: Self-pay

## 2021-01-02 ENCOUNTER — Other Ambulatory Visit (HOSPITAL_BASED_OUTPATIENT_CLINIC_OR_DEPARTMENT_OTHER): Payer: Self-pay

## 2021-01-02 DIAGNOSIS — Z23 Encounter for immunization: Secondary | ICD-10-CM

## 2021-01-02 MED ORDER — PFIZER COVID-19 VAC BIVALENT 30 MCG/0.3ML IM SUSP
INTRAMUSCULAR | 0 refills | Status: DC
  Filled 2021-01-02: qty 0.3, 1d supply, fill #0

## 2021-01-02 NOTE — Progress Notes (Signed)
   Covid-19 Vaccination Clinic  Name:  [name]    MRN: [id number] DOB: 11-Mar-1928  01/02/2021  [name] was observed post Covid-19 immunization for 15 minutes without incident. He was provided with Vaccine Information Sheet and instruction to access the V-Safe system.   [name] was instructed to call 911 with any severe reactions post vaccine: Difficulty breathing  Swelling of face and throat  A fast heartbeat  A bad rash all over body  Dizziness and weakness   Immunizations Administered     Name Date Dose VIS Date Route   Pfizer Covid-19 Vaccine Bivalent Booster 01/02/2021 11:02 AM 0.3 mL 10/29/2020 Intramuscular   Manufacturer: [place]   Lot: [id number]   [place]: [phone]

## 2021-01-15 ENCOUNTER — Telehealth: Payer: Self-pay | Admitting: Pharmacist

## 2021-01-15 NOTE — Chronic Care Management (AMB) (Signed)
Chronic Care Management Pharmacy Assistant   Name: [name]  MRN: [id number] DOB: 19-Sep-1928   Reason for Encounter: Diabetes Adherence Call    Recent office visits:  11/07/2020 OV (PCP) [name], MD; no medication changes indicated.  Recent consult visits:  12/01/2020 OV (podiatry) Gardiner Barefoot, DPM; no medication changes indicated.  Hospital visits:  None in previous 6 months  Medications: Outpatient Encounter Medications as of 01/15/2021  Medication Sig Note   apixaban (ELIQUIS) 5 MG TABS tablet Take 1 tablet (5 mg total) by mouth 2 (two) times daily.    atorvastatin (LIPITOR) 40 MG tablet Take 1 tablet (40 mg total) by mouth daily at 6 PM.    AVODART 0.5 MG capsule Take 0.5 mg by mouth every Monday, Wednesday, and Friday.     Cholecalciferol 50 MCG (2000 UT) TABS Take 1 tablet by mouth daily.    COVID-19 mRNA bivalent vaccine, Pfizer, (PFIZER COVID-19 VAC BIVALENT) injection Inject into the muscle.    diltiazem (TIAZAC) 360 MG 24 hr capsule Take 1 capsule by mouth daily.    empagliflozin (JARDIANCE) 25 MG TABS tablet Take 1 tablet (25 mg total) by mouth daily before breakfast.    fluticasone (FLONASE) 50 MCG/ACT nasal spray SHAKE LIQUID AND USE 1 SPRAY IN EACH NOSTRIL DAILY    furosemide (LASIX) 40 MG tablet TAKE 1 TABLET(40 MG) BY MOUTH DAILY    gabapentin (NEURONTIN) 300 MG capsule TAKE 1 CAPSULE(300 MG) BY MOUTH THREE TIMES DAILY    HYDROcodone-acetaminophen (NORCO/VICODIN) 5-325 MG tablet Take 1 tablet by mouth 2 (two) times daily as needed for moderate pain.    insulin glargine (LANTUS) 100 UNIT/ML injection Inject 40 Units into the skin every morning.    metFORMIN (GLUCOPHAGE) 500 MG tablet Take 500 mg by mouth 2 (two) times daily with a meal.    metoprolol succinate (TOPROL-XL) 100 MG 24 hr tablet Take 1 tablet (100 mg total) by mouth daily. Take with or immediately following a meal.    nitroGLYCERIN (NITROSTAT) 0.4 MG SL tablet Place 0.4 mg under the  tongue every 5 (five) minutes as needed for chest pain (x 3 doses). 02/15/2020: As needed   pantoprazole (PROTONIX) 40 MG tablet TAKE 1 TABLET BY MOUTH DAILY AT NOON    potassium chloride SA (KLOR-CON) 20 MEQ tablet TAKE 1 TABLET(20 MEQ) BY MOUTH DAILY    PRECISION XTRA TEST STRIPS test strip USE TO TEST BLOOD SUGAR 1 TO 2 TIMES DAILY    tamsulosin (FLOMAX) 0.4 MG CAPS capsule Take 0.4 mg by mouth every Tuesday, Thursday, Saturday, and Sunday.    No facility-administered encounter medications on file as of 01/15/2021.   Recent Relevant Labs: Lab Results  Component Value Date/Time   HGBA1C 7.9 (A) 11/07/2020 10:44 AM   HGBA1C 7.8 (A) 08/04/2020 11:33 AM   HGBA1C 8.1 (H) 05/26/2020 10:11 AM   HGBA1C 8.4 11/21/2019 12:00 AM   HGBA1C 9.0 02/09/2018 12:00 AM   MICROALBUR <0.7 03/14/2019 08:37 AM   MICROALBUR 3.5 (H) 08/01/2017 09:40 AM    Kidney Function Lab Results  Component Value Date/Time   CREATININE 1.24 08/04/2020 12:01 PM   CREATININE 1.72 (H) 07/03/2020 10:06 AM   CREATININE 1.08 03/18/2015 11:31 AM   CREATININE 1.27 (H) 01/06/2015 10:26 AM   GFR 51.29 (L) 08/04/2020 12:01 PM   GFRNONAA 49 (L) 01/17/2020 11:14 AM   GFRAA 57 (L) 01/17/2020 11:14 AM    Current antihyperglycemic regimen:  Metformin 500 mg twice daily Lantus 40 units  every morning  What recent interventions/DTPs have been made to improve glycemic control:  No recent interventions or DTPs.  Have there been any recent hospitalizations or ED visits since last visit with CPP? No  Patient denies hypoglycemic symptoms.  Patient denies hyperglycemic symptoms.  How often are you checking your blood sugar? twice daily  What are your blood sugars ranging?  Fasting: 90-110 Before meals: 160-180  During the week, how often does your blood glucose drop below 70?  Once a month  Are you checking your feet daily/regularly? Patient states he does check his feet regularly.  Adherence Review: Is the patient  currently on a STATIN medication? No Is the patient currently on ACE/ARB medication? No Does the patient have >5 day gap between last estimated fill dates? No   Care Gaps: Medicare Annual Wellness: Scheduled Ophthalmology Exam: Next due on 07/21/2021 Foot Exam: Next due on 08/22/2021 Hemoglobin A1C: 7.9% on 11/07/2020 Colonoscopy: Aged Out  Future Appointments  Date Time Provider [name]  02/09/2021  9:30 AM [name], MD [facility]  02/20/2021  8:45 AM [facility] HEALTH COACH [facility]  03/09/2021  9:30 AM Gardiner Barefoot, DPM [facility]  04/24/2021  8:00 AM [place], Peter M, MD [facility]  04/27/2021  8:00 AM Rankin, Clent Demark, MD [facility] None    Star Rating Drugs: No filled dates available for Metformin or Lantus  April D Calhoun, [place] Pharmacist Assistant [phone]

## 2021-01-27 DIAGNOSIS — E119 Type 2 diabetes mellitus without complications: Secondary | ICD-10-CM | POA: Diagnosis not present

## 2021-01-27 DIAGNOSIS — Z961 Presence of intraocular lens: Secondary | ICD-10-CM | POA: Diagnosis not present

## 2021-01-27 DIAGNOSIS — H348312 Tributary (branch) retinal vein occlusion, right eye, stable: Secondary | ICD-10-CM | POA: Diagnosis not present

## 2021-01-27 LAB — HM DIABETES EYE EXAM

## 2021-02-09 ENCOUNTER — Other Ambulatory Visit: Payer: Self-pay

## 2021-02-09 ENCOUNTER — Ambulatory Visit (INDEPENDENT_AMBULATORY_CARE_PROVIDER_SITE_OTHER): Payer: Medicare Other | Admitting: Family Medicine

## 2021-02-09 ENCOUNTER — Encounter: Payer: Self-pay | Admitting: Family Medicine

## 2021-02-09 VITALS — BP 144/80 | HR 71 | Temp 97.6°F | Ht 62.0 in | Wt 211.4 lb

## 2021-02-09 DIAGNOSIS — E1121 Type 2 diabetes mellitus with diabetic nephropathy: Secondary | ICD-10-CM

## 2021-02-09 DIAGNOSIS — I5032 Chronic diastolic (congestive) heart failure: Secondary | ICD-10-CM

## 2021-02-09 DIAGNOSIS — I482 Chronic atrial fibrillation, unspecified: Secondary | ICD-10-CM | POA: Diagnosis not present

## 2021-02-09 DIAGNOSIS — Z9861 Coronary angioplasty status: Secondary | ICD-10-CM | POA: Diagnosis not present

## 2021-02-09 DIAGNOSIS — Z794 Long term (current) use of insulin: Secondary | ICD-10-CM

## 2021-02-09 DIAGNOSIS — I10 Essential (primary) hypertension: Secondary | ICD-10-CM

## 2021-02-09 DIAGNOSIS — I2510 Atherosclerotic heart disease of native coronary artery without angina pectoris: Secondary | ICD-10-CM | POA: Diagnosis not present

## 2021-02-09 LAB — POCT GLYCOSYLATED HEMOGLOBIN (HGB A1C): Hemoglobin A1C: 7.4 % — AB (ref 4.0–5.6)

## 2021-02-09 NOTE — Patient Instructions (Signed)
Please return in 3 months    If you have any questions or concerns, please don't hesitate to send me a message via MyChart or call the office at [phone]. Thank you for visiting with [place] today! It's our pleasure caring for you.

## 2021-02-09 NOTE — Progress Notes (Signed)
Subjective  CC:  Chief Complaint  Patient presents with   Diabetes   Hypertension   Hyperlipidemia   Chronic Kidney Disease    HPI: [name] is a 87 y.o. male who presents to the office today for follow up of diabetes and problems listed above in the chief complaint.  Diabetes follow up: His diabetic control is reported as Improved.  Eating a little less.  Checking sugars in the morning and they are typically very well controlled.  Has an occasional sugar in the 70s.  No symptoms.  He denies exertional CP or SOB or symptomatic hypoglycemia. He denies foot sores or paresthesias.  Immunizations are current Hypertension has been well controlled.  Minimally elevated today.  On a beta-blocker.  No chest pain. Chronic heart failure without lower extremity edema now.  Uses Lasix. Overall feeling very very well.  Wt Readings from Last 3 Encounters:  02/09/21 211 lb 6.4 oz (95.9 kg)  11/07/20 203 lb 3.2 oz (92.2 kg)  09/03/20 205 lb (93 kg)    BP Readings from Last 3 Encounters:  02/09/21 (!) 144/80  11/07/20 140/82  10/09/20 131/75    Assessment  1. Type 2 diabetes mellitus with diabetic nephropathy, with long-term current use of insulin ([place])   2. Chronic atrial fibrillation (HCC)   3. Essential (primary) hypertension   4. CAD S/P PCI- Nov 2016   5. Chronic diastolic CHF (congestive heart failure) ([place])      Plan  Diabetes is currently very well controlled.  A1c goal less than 8.  Monitor for hypoglycemia.  Education given.  Continue current dose of Lantus 40 nightly and metformin 500 twice daily and Jardiance 25 daily. Chronic A. fib on anticoagulation, coronary disease, heart failure all stable on current medications. Hypertension borderline elevated reading today.  No change in medication.  Recheck 3 months  Follow up: 3 months to recheck blood pressure and diabetes. Orders Placed This Encounter  Procedures   Basic metabolic panel   CBC with Differential/Platelet    POCT HgB A1C   No orders of the defined types were placed in this encounter.     Immunization History  Administered Date(s) Administered   Fluad Quad(high Dose 65+) 11/02/2018, 11/30/2019, 11/07/2020   Influenza Split 11/14/2007, 01/01/2009, 12/23/2009, 11/29/2012, 10/31/2015   Influenza, High Dose Seasonal PF 11/02/2012, 12/12/2013, 11/15/2014, 11/24/2017, 01/17/2018   Influenza, Seasonal, Injecte, Preservative Fre 11/18/2015   Influenza,inj,Quad PF,6+ Mos 02/15/2017   Influenza,trivalent, recombinat, inj, PF 12/07/2010   Influenza-Unspecified 10/28/2011, 11/30/2014, 02/15/2017   PFIZER(Purple Top)SARS-COV-2 Vaccination 03/23/2019, 04/14/2019, 12/06/2019, 06/21/2020   Pfizer Covid-19 Vaccine Bivalent Booster 46yrs & up 01/02/2021   Pneumococcal Conjugate-13 12/13/2013   Pneumococcal-Unspecified 08/20/2008   Td 05/29/2002   Tdap 10/28/2011, 02/15/2017   Zoster Recombinat (Shingrix) 09/16/2016, 10/13/2016, 12/13/2016, 01/11/2017   Zoster, Live 08/20/2008    Diabetes Related Lab Review: Lab Results  Component Value Date   HGBA1C 7.4 (A) 02/09/2021   HGBA1C 7.9 (A) 11/07/2020   HGBA1C 7.8 (A) 08/04/2020    Lab Results  Component Value Date   MICROALBUR <0.7 03/14/2019   Lab Results  Component Value Date   CREATININE 1.24 08/04/2020   BUN 20 08/04/2020   NA 139 08/04/2020   K 4.8 08/04/2020   CL 101 08/04/2020   CO2 31 08/04/2020   Lab Results  Component Value Date   CHOL 154 08/04/2020   CHOL 147 08/13/2019   CHOL 143 10/04/2018   Lab Results  Component Value Date  HDL 46.10 08/04/2020   HDL 49.70 08/13/2019   HDL 44.60 10/04/2018   Lab Results  Component Value Date   LDLCALC 90 08/04/2020   LDLCALC 79 08/13/2019   LDLCALC 77 10/04/2018   Lab Results  Component Value Date   TRIG 86.0 08/04/2020   TRIG 93.0 08/13/2019   TRIG 106.0 10/04/2018   Lab Results  Component Value Date   CHOLHDL 3 08/04/2020   CHOLHDL 3 08/13/2019   CHOLHDL 3 10/04/2018    No results found for: LDLDIRECT The ASCVD Risk score (Arnett DK, et al., 2019) failed to calculate for the following reasons:   The 2019 ASCVD risk score is only valid for ages 7 to 27   The patient has a prior MI or stroke diagnosis I have reviewed the PMH, Fam and Soc history. Patient Active Problem List   Diagnosis Date Noted   Moderate nonproliferative diabetic retinopathy of left eye ([place]) 06/18/2020    Priority: High   Stage 3b chronic kidney disease ([place]) 05/26/2020    Priority: High   Aortic stenosis, moderate 05/22/2019    Priority: High    Stable echocardiogram 05/2020, Dr. [place]    Chronic diastolic CHF (congestive heart failure) ([place]) 03/14/2019    Priority: High   Spinal stenosis of lumbar region 02/21/2018    Priority: High   Diabetic peripheral neuropathy associated with type 2 diabetes mellitus ([place]) 05/05/2017    Priority: High    No pain    Atherosclerotic heart disease of native coronary artery without angina pectoris 06/13/2016    Priority: High    Overview:  Overview:  RCA DES placed Nov 2016, LIMA-LAD presumed patent based on Myoview but no visualized, SVG-Dx occluded  Last Assessment & Plan:  RCA DES placed Nov 2016, LIMA-LAD presumed patent based on Myoview but no visualized, SVG-Dx occluded    CVA (cerebral vascular accident) ([place]) 06/06/2016    Priority: High   Recurrent coronary arteriosclerosis after percutaneous transluminal coronary angioplasty 04/18/2015    Priority: High    Overview:  Overview:  RCA DES placed Nov 2016, LIMA-LAD presumed patent based on Myoview but no visualized, SVG-Dx occluded  Last Assessment & Plan:  RCA DES placed Nov 2016, LIMA-LAD presumed patent based on Myoview but no visualized, SVG-Dx occluded    CAD S/P PCI- Nov 2016     Priority: High    RCA DES placed Nov 2016, LIMA-LAD presumed patent based on Myoview but no visualized, SVG-Dx occluded    Current use of long term anticoagulation 02/06/2013     Priority: High    Overview:  Monitored by cardiology    Chronic atrial fibrillation ([place]) 07/30/2011    Priority: High    CHADs VASc=5 for age, HTN, vascular disease, and DM    Hx of CABG x 2 1990     Priority: High    Status post CABG x2 in 1990 including an LIMA graft to the LAD, and a vein graft to the diagonal.     Essential (primary) hypertension     Priority: High   Diabetes mellitus with diabetic nephropathy, with long-term current use of insulin ([place])     Priority: High    Neg urine microalbuminuria, not on ace.  On farxiga    Mixed hyperlipidemia     Priority: High   Thoracic aortic aneurysm without rupture 05/26/2020    Priority: Medium     Followed by cards. Stable by CT 2022    Degeneration of lumbar intervertebral disc 02/21/2018  Priority: Medium    Chronic vertigo 03/15/2014    Priority: Medium    Obesity (BMI 30.0-34.9) 01/31/2012    Priority: Medium    Enlarged prostate without lower urinary tract symptoms (luts) 01/04/2011    Priority: Medium     Overview:  Urology - avodart and tamuloscin    Gastro-esophageal reflux disease without esophagitis 12/07/2010    Priority: Medium    Epistaxis, recurrent 04/11/2015    Priority: Low   Osteoarthritis, knee 01/31/2012    Priority: Low   Allergic rhinitis 10/28/2011    Priority: Low   Hearing loss 06/08/2011    Priority: Low   Primary osteoarthritis of hand 06/08/2011    Priority: Low   Benign hematuria 08/27/2008    Priority: Low    Essential Hematuria - urology - Risa Grill w/u benign     Stable hemispheric central retinal vein occlusion (CRVO) of right eye 07/21/2020   Lower extremity edema 03/14/2019    Social History: Patient  reports that he quit smoking about 62 years ago. His smoking use included cigarettes. He has a 30.00 pack-year smoking history. He has never used smokeless tobacco. He reports that he does not drink alcohol and does not use drugs.  Review of Systems: Ophthalmic: negative  for eye pain, loss of vision or double vision Cardiovascular: negative for chest pain Respiratory: negative for SOB or persistent cough Gastrointestinal: negative for abdominal pain Genitourinary: negative for dysuria or gross hematuria MSK: negative for foot lesions Neurologic: negative for weakness or gait disturbance  Objective  Vitals: BP (!) 144/80   Pulse 71   Temp 97.6 F (36.4 C) (Temporal)   Ht 5\' 2"  (1.575 m)   Wt 211 lb 6.4 oz (95.9 kg)   SpO2 95%   BMI 38.67 kg/m  General: well appearing, no acute distress  Psych:  Alert and oriented, normal mood and affect HEENT:  Normocephalic, atraumatic, moist mucous membranes, supple neck  Cardiovascular:  Nl S1 and S2, RRR without murmur, gallop or rub. no edema Respiratory:  Good breath sounds bilaterally, CTAB with normal effort, no rales    Diabetic education: ongoing education regarding chronic disease management for diabetes was given today. We continue to reinforce the ABC's of diabetic management: A1c (<7 or 8 dependent upon patient), tight blood pressure control, and cholesterol management with goal LDL < 100 minimally. We discuss diet strategies, exercise recommendations, medication options and possible side effects. At each visit, we review recommended immunizations and preventive care recommendations for diabetics and stress that good diabetic control can prevent other problems. See below for this patient's data.   Commons side effects, risks, benefits, and alternatives for medications and treatment plan prescribed today were discussed, and the patient expressed understanding of the given instructions. Patient is instructed to call or message via MyChart if he/she has any questions or concerns regarding our treatment plan. No barriers to understanding were identified. We discussed Red Flag symptoms and signs in detail. Patient expressed understanding regarding what to do in case of urgent or emergency type symptoms.   Medication list was reconciled, printed and provided to the patient in AVS. Patient instructions and summary information was reviewed with the patient as documented in the AVS. This note was prepared with assistance of Dragon voice recognition software. Occasional wrong-word or sound-a-like substitutions may have occurred due to the inherent limitations of voice recognition software  This visit occurred during the SARS-CoV-2 public health emergency.  Safety protocols were in place, including screening questions prior to the  visit, additional usage of staff PPE, and extensive cleaning of exam room while observing appropriate contact time as indicated for disinfecting solutions.

## 2021-02-18 ENCOUNTER — Telehealth: Payer: Self-pay

## 2021-02-18 ENCOUNTER — Other Ambulatory Visit: Payer: Self-pay

## 2021-02-18 MED ORDER — HYDROCODONE-ACETAMINOPHEN 5-325 MG PO TABS: 1.0000 | ORAL_TABLET | Freq: Two times a day (BID) | ORAL | 0 refills | Status: DC | PRN

## 2021-02-18 NOTE — Telephone Encounter (Signed)
Spoke with patient, let him know medication was called in gave a verbal understanding

## 2021-02-18 NOTE — Telephone Encounter (Signed)
Medication has been filled 

## 2021-02-18 NOTE — Telephone Encounter (Signed)
Last Refill 07/07/2020 Last OV 02/09/2021 dx type 2 diabetes

## 2021-02-18 NOTE — Telephone Encounter (Signed)
Would like his Hydrocodone refilled today. He is going out of town and does not want to risk his back hurting while out there. Would like the assistant to call him with confirmation please.

## 2021-02-18 NOTE — Telephone Encounter (Signed)
Request sent to PCP

## 2021-02-18 NOTE — Telephone Encounter (Signed)
Patient states he needs filled today before he leaves out of town. Is requesting a call back once sent to the pharmacy.

## 2021-02-20 ENCOUNTER — Ambulatory Visit: Payer: Medicare Other

## 2021-03-05 ENCOUNTER — Encounter: Payer: Self-pay | Admitting: Family Medicine

## 2021-03-05 ENCOUNTER — Other Ambulatory Visit: Payer: Self-pay

## 2021-03-05 ENCOUNTER — Ambulatory Visit (INDEPENDENT_AMBULATORY_CARE_PROVIDER_SITE_OTHER): Payer: Medicare Other

## 2021-03-05 VITALS — BP 128/64 | HR 91 | Temp 98.1°F | Wt 209.6 lb

## 2021-03-05 DIAGNOSIS — G3184 Mild cognitive impairment, so stated: Secondary | ICD-10-CM

## 2021-03-05 DIAGNOSIS — Z0000 Encounter for general adult medical examination without abnormal findings: Secondary | ICD-10-CM | POA: Diagnosis not present

## 2021-03-05 HISTORY — DX: Mild cognitive impairment of uncertain or unknown etiology: G31.84

## 2021-03-05 NOTE — Progress Notes (Addendum)
Subjective:   [name] is a 88 y.o. male who presents for Medicare Annual/Subsequent preventive examination.  Review of Systems     Cardiac Risk Factors include: diabetes mellitus;hypertension;dyslipidemia;male gender;obesity (BMI >30kg/m2);advanced age (>69men, >66 women)     Objective:    Today's Vitals   03/05/21 0847  BP: 128/64  Pulse: 91  Temp: 98.1 F (36.7 C)  SpO2: 93%  Weight: 209 lb 9.6 oz (95.1 kg)   Body mass index is 38.34 kg/m.  Advanced Directives 03/05/2021 02/15/2020 05/14/2019 05/14/2019 05/13/2019 05/12/2019 11/02/2018  Does Patient Have a Medical Advance Directive? Yes No - Yes No Yes Yes  Type of Advance Directive [place] (No Data) - - Living will;Healthcare Power of Attorney  Does patient want to make changes to medical advance directive? - - No - Patient declined No - Patient declined - - No - Patient declined  Copy of [place] in Chart? No - copy requested - - - - - No - copy requested  Would patient like information on creating a medical advance directive? - No - Patient declined - - - - -    Current Medications (verified) Outpatient Encounter Medications as of 03/05/2021  Medication Sig   apixaban (ELIQUIS) 5 MG TABS tablet Take 1 tablet (5 mg total) by mouth 2 (two) times daily.   atorvastatin (LIPITOR) 40 MG tablet Take 1 tablet (40 mg total) by mouth daily at 6 PM.   AVODART 0.5 MG capsule Take 0.5 mg by mouth every Monday, Wednesday, and Friday.    Cholecalciferol 50 MCG (2000 UT) TABS Take 1 tablet by mouth daily.   diltiazem (TIAZAC) 360 MG 24 hr capsule Take 1 capsule by mouth daily.   empagliflozin (JARDIANCE) 25 MG TABS tablet Take 1 tablet (25 mg total) by mouth daily before breakfast.   fluticasone (FLONASE) 50 MCG/ACT nasal spray SHAKE LIQUID AND USE 1 SPRAY IN EACH NOSTRIL DAILY   furosemide (LASIX) 40 MG tablet TAKE 1 TABLET(40 MG) BY MOUTH DAILY   gabapentin (NEURONTIN) 300 MG capsule TAKE  1 CAPSULE(300 MG) BY MOUTH THREE TIMES DAILY   HYDROcodone-acetaminophen (NORCO/VICODIN) 5-325 MG tablet Take 1 tablet by mouth 2 (two) times daily as needed for moderate pain.   insulin glargine (LANTUS) 100 UNIT/ML injection Inject 40 Units into the skin every morning.   metFORMIN (GLUCOPHAGE) 500 MG tablet Take 500 mg by mouth 2 (two) times daily with a meal.   metoprolol succinate (TOPROL-XL) 100 MG 24 hr tablet Take 1 tablet (100 mg total) by mouth daily. Take with or immediately following a meal.   nitroGLYCERIN (NITROSTAT) 0.4 MG SL tablet Place 0.4 mg under the tongue every 5 (five) minutes as needed for chest pain (x 3 doses).   pantoprazole (PROTONIX) 40 MG tablet TAKE 1 TABLET BY MOUTH DAILY AT NOON   potassium chloride SA (KLOR-CON) 20 MEQ tablet TAKE 1 TABLET(20 MEQ) BY MOUTH DAILY   PRECISION XTRA TEST STRIPS test strip USE TO TEST BLOOD SUGAR 1 TO 2 TIMES DAILY   [DISCONTINUED] tamsulosin (FLOMAX) 0.4 MG CAPS capsule Take 0.4 mg by mouth every Tuesday, Thursday, Saturday, and Sunday. (Patient not taking: Reported on 03/05/2021)   No facility-administered encounter medications on file as of 03/05/2021.    Allergies (verified) Erythromycin base, Septra [sulfamethoxazole-trimethoprim], Cefadroxil, Ciprofloxacin, and Erythromycin   History: Past Medical History:  Diagnosis Date   Abdominal pain    Acute renal failure ([place])     resolved  Arthritis    "hands & legs" (11/'10/2014)   Ataxia    Atrial fibrillation ([facility])    Bladder outlet obstruction    Bladder outlet obstruction    BPH (benign prostatic hyperplasia)    CAD (coronary artery disease)    a. CABG IN 1989. b. 01/08/2015 CTO of ost LAD, LIMA to LAD not visualized but assumed patent given myoview finding, occluded SVG to diagonal, 99% mid RCA tx w/ SYNERGY DES 3X28 mm   Constipation    Diabetes mellitus type 2, insulin dependent ([place])    Epistaxis, recurrent Feb 2017   GERD (gastroesophageal reflux disease)    HTN  (hypertension)    Hx of bacterial pneumonia    Hyperlipemia    Kidney stones    Mild aortic stenosis    Pneumonia 04/2019   Rib fractures    left rib fractures being treated with pain medications   Stented coronary artery Nov 2016   RCA DES   Urinary tract infection     Enterococcus   Past Surgical History:  Procedure Laterality Date   [place] N/A 01/08/2015   Procedure: Left Heart Cath and Cors/Grafts Angiography;  Surgeon: Peter M Martinique, MD;  Location: [facility];  Service: Cardiovascular;  Laterality: N/A;   CARDIAC CATHETERIZATION  01/08/2015   Procedure: Coronary Stent Intervention;  Surgeon: Peter M Martinique, MD;  Location: [facility];  Service: Cardiovascular;;   CARPAL TUNNEL RELEASE Right 08/2010   CATARACT EXTRACTION W/ INTRAOCULAR LENS  IMPLANT, BILATERAL Bilateral    CORONARY ANGIOPLASTY  01/08/15   RCA DES   CORONARY ARTERY BYPASS GRAFT  1989   "CABG X 2"   IR THORACENTESIS ASP PLEURAL SPACE W/IMG GUIDE  05/14/2019   JOINT REPLACEMENT     TONSILLECTOMY     TOTAL HIP ARTHROPLASTY Right 2000   Family History  Problem Relation Age of Onset   Brain cancer Father    Throat cancer Brother    Social History   Socioeconomic History   Marital status: Married    Spouse name: Not on file   Number of children: 3   Years of education: Not on file   Highest education level: Not on file  Occupational History   Occupation: Agricultural consultant  Tobacco Use   Smoking status: Former    Packs/day: 3.00    Years: 10.00    Pack years: 30.00    Types: Cigarettes    Quit date: 05/11/1958    Years since quitting: 62.8   Smokeless tobacco: Never  Vaping Use   Vaping Use: Never used  Substance and Sexual Activity   Alcohol use: No   Drug use: No   Sexual activity: Not Currently  Other Topics Concern   Not on file  Social History Narrative   Previously lived in [place], [place] currently living in  [place], [place]    Social Determinants of Health   Financial Resource Strain: Low Risk    Difficulty of Paying Living Expenses: Not hard at all  Food Insecurity: No Food Insecurity   Worried About Charity fundraiser in the Last Year: Never true   Arboriculturist in the Last Year: Never true  Transportation Needs: No Transportation Needs   Lack of Transportation (Medical): No   Lack of Transportation (Non-Medical): No  Physical Activity: Inactive   Days of Exercise per Week: 0 days   Minutes of Exercise per Session: 0  min  Stress: No Stress Concern Present   Feeling of Stress : Not at all  Social Connections: Moderately Isolated   Frequency of Communication with Friends and Family: Three times a week   Frequency of Social Gatherings with Friends and Family: Three times a week   Attends Religious Services: Never   Active Member of Clubs or Organizations: No   Attends Music therapist: Never   Marital Status: Married    Tobacco Counseling Counseling given: Not Answered   Clinical Intake:  Pre-visit preparation completed: Yes  Pain : No/denies pain     BMI - recorded: 38.34 Nutritional Status: BMI > 30  Obese Nutritional Risks: None Diabetes: Yes CBG done?: No Did pt. bring in CBG monitor from home?: No  How often do you need to have someone help you when you read instructions, pamphlets, or other written materials from your doctor or pharmacy?: 1 - Never  Diabetic?Nutrition Risk Assessment:  Has the patient had any N/V/D within the last 2 months?  No  Does the patient have any non-healing wounds?  No  Has the patient had any unintentional weight loss or weight gain?  No   Diabetes:  Is the patient diabetic?  Yes  If diabetic, was a CBG obtained today?  No  Did the patient bring in their glucometer from home?  No  How often do you monitor your CBG's? Daily.   Financial Strains and Diabetes Management:  Are you having any financial strains with the  device, your supplies or your medication? No .  Does the patient want to be seen by Chronic Care Management for management of their diabetes?  No  Would the patient like to be referred to a Nutritionist or for Diabetic Management?  No   Diabetic Exams:  Diabetic Eye Exam: Completed 07/21/20 Diabetic Foot Exam: Completed 08/22/20   Interpreter Needed?: No  Information entered by :: Charlott Rakes, LPN   Activities of Daily Living In your present state of health, do you have any difficulty performing the following activities: 03/05/2021  Hearing? Y  Comment HOH  Vision? N  Difficulty concentrating or making decisions? Y  Comment memory at times  Walking or climbing stairs? Y  Dressing or bathing? N  Doing errands, shopping? N  Preparing Food and eating ? N  Using the Toilet? N  In the past six months, have you accidently leaked urine? N  Do you have problems with loss of bowel control? N  Managing your Medications? N  Managing your Finances? N  Housekeeping or managing your Housekeeping? N  Some recent data might be hidden    Patient Care Team: [name], MD as PCP - General (Family Medicine) Martinique, Peter M, MD as PCP - Cardiology (Cardiology) Martinique, Peter M, MD as Consulting Physician (Cardiology) Gardiner Barefoot, DPM as Consulting Physician (Podiatry) [name], Jessy Oto, MD (Inactive) as Consulting Physician (Vascular Surgery) Melida Quitter, MD as Consulting Physician (Otolaryngology) Zadie Rhine Clent Demark, MD as Consulting Physician (Ophthalmology) [place] as Consulting Physician ([place]) [place], [place] (Neurosurgery) Edythe Clarity, [facility] (Pharmacist)  Indicate any recent Medical Services you may have received from other than [facility] providers in the past year (date may be approximate).     Assessment:   This is a routine wellness examination for [employer].  Hearing/Vision screen Hearing Screening - Comments:: Pt  stated slight loss  Vision Screening - Comments:: Pt follows up with Dr Katy Fitch &amp; Dr Zadie Rhine for annual  eye exams   Dietary issues and exercise activities discussed: Current Exercise Habits: The patient does not participate in regular exercise at present   Goals Addressed             This Visit's Progress    Patient Stated       Stay healthy       Depression Screen PHQ 2/9 Scores 03/05/2021 10/09/2020 02/15/2020 08/13/2019 11/08/2018 11/02/2018 06/14/2018  PHQ - 2 Score 0 0 0 0 0 0 0    Fall Risk Fall Risk  03/05/2021 02/15/2020 08/13/2019 03/14/2019 12/04/2018  Falls in the past year? 0 0 0 0 0  Number falls in past yr: 0 0 - - 0  Injury with Fall? 0 0 - - 0  Risk for fall due to : Impaired vision;Impaired balance/gait;Impaired mobility Impaired balance/gait;Impaired vision - - Impaired mobility;Impaired balance/gait  Risk for fall due to: Comment uses a cane - - - -  Follow up Falls prevention discussed Falls prevention discussed - Falls evaluation completed Falls evaluation completed    [place]:  Any stairs in or around the home? Yes  If so, are there any without handrails? No  Home free of loose throw rugs in walkways, pet beds, electrical cords, etc? Yes  Adequate lighting in your home to reduce risk of falls? Yes   ASSISTIVE DEVICES UTILIZED TO PREVENT FALLS:  Life alert? No  Use of a cane, walker or w/c? Yes  Grab bars in the bathroom? Yes  Shower chair or bench in shower? Yes  Elevated toilet seat or a handicapped toilet? Yes   TIMED UP AND GO:  Was the test performed? Yes .  Length of time to ambulate 10 feet: 15 sec.   Gait steady and fast without use of assistive device  Cognitive Function: MMSE - Mini Mental State Exam 06/08/2017  Orientation to time 5  Orientation to Place 5  Registration 3  Attention/ Calculation 3  Recall 1  Language- name 2 objects 2  Language- repeat 1  Language- follow 3 step command 3  Language-  read & follow direction 1  Write a sentence 1  Copy design 1  Total score 26     6CIT Screen 03/05/2021 02/15/2020  What Year? 0 points 0 points  What month? 0 points 0 points  What time? 3 points -  Count back from 20 0 points 0 points  Months in reverse 0 points 0 points  Repeat phrase 6 points 4 points  Total Score 9 -    Immunizations Immunization History  Administered Date(s) Administered   Fluad Quad(high Dose 65+) 11/02/2018, 11/30/2019, 11/07/2020   Influenza Split 11/14/2007, 01/01/2009, 12/23/2009, 11/29/2012, 10/31/2015   Influenza, High Dose Seasonal PF 11/02/2012, 12/12/2013, 11/15/2014, 11/24/2017, 01/17/2018   Influenza, Seasonal, Injecte, Preservative Fre 11/18/2015   Influenza,inj,Quad PF,6+ Mos 02/15/2017   Influenza,trivalent, recombinat, inj, PF 12/07/2010   Influenza-Unspecified 10/28/2011, 11/30/2014, 02/15/2017   PFIZER(Purple Top)SARS-COV-2 Vaccination 03/23/2019, 04/14/2019, 12/06/2019, 06/21/2020   Pfizer Covid-19 Vaccine Bivalent Booster 60yrs & up 01/02/2021   Pneumococcal Conjugate-13 12/13/2013   Pneumococcal-Unspecified 08/20/2008   Td 05/29/2002   Tdap 10/28/2011, 02/15/2017   Zoster Recombinat (Shingrix) 09/16/2016, 10/13/2016, 12/13/2016, 01/11/2017   Zoster, Live 08/20/2008    TDAP status: Up to date  Flu Vaccine status: Up to date  Pneumococcal vaccine status: Up to date  Covid-19 vaccine status: Completed vaccines  Qualifies for Shingles Vaccine? Yes   Zostavax completed Yes   Shingrix Completed?: Yes  Screening Tests Health Maintenance  Topic Date Due   Pneumonia Vaccine 8+ Years old (2 - PPSV23 if available, else PCV20) 12/14/2014   OPHTHALMOLOGY EXAM  07/21/2021   HEMOGLOBIN A1C  08/10/2021   FOOT EXAM  08/22/2021   TETANUS/TDAP  02/16/2027   INFLUENZA VACCINE  Completed   COVID-19 Vaccine  Completed   Zoster Vaccines- Shingrix  Completed   HPV VACCINES  Aged Out   URINE MICROALBUMIN  Discontinued    Health  Maintenance  Health Maintenance Due  Topic Date Due   Pneumonia Vaccine 34+ Years old (2 - PPSV23 if available, else PCV20) 12/14/2014    Colorectal cancer screening: No longer required.   Additional Screening:   Vision Screening: Recommended annual ophthalmology exams for early detection of glaucoma and other disorders of the eye. Is the patient up to date with their annual eye exam?  Yes  Who is the provider or what is the name of the office in which the patient attends annual eye exams? Dr Doyce Para and dr Zadie Rhine  If pt is not established with a provider, would they like to be referred to a provider to establish care? No .   Dental Screening: Recommended annual dental exams for proper oral hygiene  Community Resource Referral / Chronic Care Management: CRR required this visit?  No   CCM required this visit?  No      Plan:     I have personally reviewed and noted the following in the patients chart:   Medical and social history Use of alcohol, tobacco or illicit drugs  Current medications and supplements including opioid prescriptions. Patient is currently taking opioid prescriptions. Information provided to patient regarding non-opioid alternatives. Patient advised to discuss non-opioid treatment plan with their provider. Functional ability and status Nutritional status Physical activity Advanced directives List of other physicians Hospitalizations, surgeries, and ER visits in previous 12 months Vitals Screenings to include cognitive, depression, and falls Referrals and appointments  In addition, I have reviewed and discussed with patient certain preventive protocols, quality metrics, and best practice recommendations. A written personalized care plan for preventive services as well as general preventive health recommendations were provided to patient.     Willette Brace, LPN   [id number]   Nurse Notes: None

## 2021-03-05 NOTE — Patient Instructions (Addendum)
[name] , Thank you for taking time to come for your Medicare Wellness Visit. I appreciate your ongoing commitment to your health goals. Please review the following plan we discussed and let me know if I can assist you in the future.   Screening recommendations/referrals: Colonoscopy: No longer required  Recommended yearly ophthalmology/optometry visit for glaucoma screening and checkup Recommended yearly dental visit for hygiene and checkup  Vaccinations: Influenza vaccine: Done 11/07/20 repeat every year  Pneumococcal vaccine: Up to date Tdap vaccine: Done 02/15/17  Shingles vaccine: Completed 09/16/16 & 12/13/16   Covid-19: Completed 1/22, 2/13, 12/06/19 & 4/23 , 01/02/21  Advanced directives: Please bring a copy of your health care power of attorney and living will to the office at your convenience.  Conditions/risks identified: stay healthy   Next appointment: Follow up in one year for your annual wellness visit.   Preventive Care 86 Years and Older, Male Preventive care refers to lifestyle choices and visits with your health care provider that can promote health and wellness. What does preventive care include? A yearly physical exam. This is also called an annual well check. Dental exams once or twice a year. Routine eye exams. Ask your health care provider how often you should have your eyes checked. Personal lifestyle choices, including: Daily care of your teeth and gums. Regular physical activity. Eating a healthy diet. Avoiding tobacco and drug use. Limiting alcohol use. Practicing safe sex. Taking low doses of aspirin every day. Taking vitamin and mineral supplements as recommended by your health care provider. What happens during an annual well check? The services and screenings done by your health care provider during your annual well check will depend on your age, overall health, lifestyle risk factors, and family history of disease. Counseling  Your health care  provider may ask you questions about your: Alcohol use. Tobacco use. Drug use. Emotional well-being. Home and relationship well-being. Sexual activity. Eating habits. History of falls. Memory and ability to understand (cognition). Work and work Statistician. Screening  You may have the following tests or measurements: Height, weight, and BMI. Blood pressure. Lipid and cholesterol levels. These may be checked every 5 years, or more frequently if you are over 52 years old. Skin check. Lung cancer screening. You may have this screening every year starting at age 18 if you have a 30-pack-year history of smoking and currently smoke or have quit within the past 15 years. Fecal occult blood test (FOBT) of the stool. You may have this test every year starting at age 9. Flexible sigmoidoscopy or colonoscopy. You may have a sigmoidoscopy every 5 years or a colonoscopy every 10 years starting at age 87. Prostate cancer screening. Recommendations will vary depending on your family history and other risks. Hepatitis C blood test. Hepatitis B blood test. Sexually transmitted disease (STD) testing. Diabetes screening. This is done by checking your blood sugar (glucose) after you have not eaten for a while (fasting). You may have this done every 1-3 years. Abdominal aortic aneurysm (AAA) screening. You may need this if you are a current or former smoker. Osteoporosis. You may be screened starting at age 86 if you are at high risk. Talk with your health care provider about your test results, treatment options, and if necessary, the need for more tests. Vaccines  Your health care provider may recommend certain vaccines, such as: Influenza vaccine. This is recommended every year. Tetanus, diphtheria, and acellular pertussis (Tdap, Td) vaccine. You may need a Td booster every 10 years. Zoster  vaccine. You may need this after age 86. Pneumococcal 13-valent conjugate (PCV13) vaccine. One dose is  recommended after age 25. Pneumococcal polysaccharide (PPSV23) vaccine. One dose is recommended after age 38. Talk to your health care provider about which screenings and vaccines you need and how often you need them. This information is not intended to replace advice given to you by your health care provider. Make sure you discuss any questions you have with your health care provider. Document Released: 03/14/2015 Document Revised: 11/05/2015 Document Reviewed: 12/17/2014 Elsevier Interactive Patient Education  2017 [place] Prevention in the Home Falls can cause injuries. They can happen to people of all ages. There are many things you can do to make your home safe and to help prevent falls. What can I do on the outside of my home? Regularly fix the edges of walkways and driveways and fix any cracks. Remove anything that might make you trip as you walk through a door, such as a raised step or threshold. Trim any bushes or trees on the path to your home. Use bright outdoor lighting. Clear any walking paths of anything that might make someone trip, such as rocks or tools. Regularly check to see if handrails are loose or broken. Make sure that both sides of any steps have handrails. Any raised decks and porches should have guardrails on the edges. Have any leaves, snow, or ice cleared regularly. Use sand or salt on walking paths during winter. Clean up any spills in your garage right away. This includes oil or grease spills. What can I do in the bathroom? Use night lights. Install grab bars by the toilet and in the tub and shower. Do not use towel bars as grab bars. Use non-skid mats or decals in the tub or shower. If you need to sit down in the shower, use a plastic, non-slip stool. Keep the floor dry. Clean up any water that spills on the floor as soon as it happens. Remove soap buildup in the tub or shower regularly. Attach bath mats securely with double-sided non-slip rug  tape. Do not have throw rugs and other things on the floor that can make you trip. What can I do in the bedroom? Use night lights. Make sure that you have a light by your bed that is easy to reach. Do not use any sheets or blankets that are too big for your bed. They should not hang down onto the floor. Have a firm chair that has side arms. You can use this for support while you get dressed. Do not have throw rugs and other things on the floor that can make you trip. What can I do in the kitchen? Clean up any spills right away. Avoid walking on wet floors. Keep items that you use a lot in easy-to-reach places. If you need to reach something above you, use a strong step stool that has a grab bar. Keep electrical cords out of the way. Do not use floor polish or wax that makes floors slippery. If you must use wax, use non-skid floor wax. Do not have throw rugs and other things on the floor that can make you trip. What can I do with my stairs? Do not leave any items on the stairs. Make sure that there are handrails on both sides of the stairs and use them. Fix handrails that are broken or loose. Make sure that handrails are as long as the stairways. Check any carpeting to make sure that it  is firmly attached to the stairs. Fix any carpet that is loose or worn. Avoid having throw rugs at the top or bottom of the stairs. If you do have throw rugs, attach them to the floor with carpet tape. Make sure that you have a light switch at the top of the stairs and the bottom of the stairs. If you do not have them, ask someone to add them for you. What else can I do to help prevent falls? Wear shoes that: Do not have high heels. Have rubber bottoms. Are comfortable and fit you well. Are closed at the toe. Do not wear sandals. If you use a stepladder: Make sure that it is fully opened. Do not climb a closed stepladder. Make sure that both sides of the stepladder are locked into place. Ask someone to  hold it for you, if possible. Clearly mark and make sure that you can see: Any grab bars or handrails. First and last steps. Where the edge of each step is. Use tools that help you move around (mobility aids) if they are needed. These include: Canes. Walkers. Scooters. Crutches. Turn on the lights when you go into a dark area. Replace any light bulbs as soon as they burn out. Set up your furniture so you have a clear path. Avoid moving your furniture around. If any of your floors are uneven, fix them. If there are any pets around you, be aware of where they are. Review your medicines with your doctor. Some medicines can make you feel dizzy. This can increase your chance of falling. Ask your doctor what other things that you can do to help prevent falls. This information is not intended to replace advice given to you by your health care provider. Make sure you discuss any questions you have with your health care provider. Document Released: 12/12/2008 Document Revised: 07/24/2015 Document Reviewed: 03/22/2014 Elsevier Interactive Patient Education  2017 [employer].

## 2021-03-09 ENCOUNTER — Other Ambulatory Visit: Payer: Self-pay

## 2021-03-09 ENCOUNTER — Ambulatory Visit (INDEPENDENT_AMBULATORY_CARE_PROVIDER_SITE_OTHER): Payer: Medicare Other | Admitting: Podiatry

## 2021-03-09 ENCOUNTER — Encounter: Payer: Self-pay | Admitting: Podiatry

## 2021-03-09 DIAGNOSIS — E1142 Type 2 diabetes mellitus with diabetic polyneuropathy: Secondary | ICD-10-CM

## 2021-03-09 DIAGNOSIS — M79674 Pain in right toe(s): Secondary | ICD-10-CM

## 2021-03-09 DIAGNOSIS — B351 Tinea unguium: Secondary | ICD-10-CM | POA: Diagnosis not present

## 2021-03-09 DIAGNOSIS — D689 Coagulation defect, unspecified: Secondary | ICD-10-CM

## 2021-03-09 NOTE — Progress Notes (Signed)
This patient returns to my office for at risk foot care.  This patient requires this care by a professional since this patient will be at risk due to having diabetic neuropathy and coagulation defect.  Patient is taking eliquiss.  Patient says toenails are painful walking and wearing his shoes.  This patient presents for at risk foot care today.  General Appearance  Alert, conversant and in no acute stress.  Vascular  Dorsalis pedis and posterior tibial  pulses are weakly  palpable  bilaterally.  Capillary return is within normal limits  bilaterally. Temperature is within normal limits  bilaterally.  Neurologic  Senn-Weinstein monofilament wire test within normal limits  bilaterally. Muscle power within normal limits bilaterally.  Nails Thick disfigured discolored nails with subungual debris  from hallux to fifth toes bilaterally. No evidence of bacterial infection or drainage bilaterally.  Orthopedic  No limitations of motion  feet .  No crepitus or effusions noted.  No bony pathology or digital deformities noted.  Skin  normotropic skin with no porokeratosis noted bilaterally.  No signs of infections or ulcers noted.     Onychomycosis  B/L  Consent was obtained for treatment procedures.   Debridement of nails with nail nipper followed by dremel tool. Filed with dremel without incident.    Return office visit    12   weeks                Told patient to return for periodic foot care and evaluation due to potential at risk complications.   Alleen Kehm DPM  

## 2021-04-12 ENCOUNTER — Other Ambulatory Visit: Payer: Self-pay

## 2021-04-12 ENCOUNTER — Emergency Department (HOSPITAL_BASED_OUTPATIENT_CLINIC_OR_DEPARTMENT_OTHER): Payer: Medicare Other | Admitting: Radiology

## 2021-04-12 ENCOUNTER — Emergency Department (HOSPITAL_BASED_OUTPATIENT_CLINIC_OR_DEPARTMENT_OTHER)
Admission: EM | Admit: 2021-04-12 | Discharge: 2021-04-12 | Disposition: A | Discharge status: Home / Self Care | Payer: Medicare Other | Attending: Emergency Medicine | Admitting: Emergency Medicine

## 2021-04-12 ENCOUNTER — Encounter (HOSPITAL_BASED_OUTPATIENT_CLINIC_OR_DEPARTMENT_OTHER): Payer: Self-pay | Admitting: Emergency Medicine

## 2021-04-12 DIAGNOSIS — Z794 Long term (current) use of insulin: Secondary | ICD-10-CM | POA: Insufficient documentation

## 2021-04-12 DIAGNOSIS — Y9301 Activity, walking, marching and hiking: Secondary | ICD-10-CM | POA: Insufficient documentation

## 2021-04-12 DIAGNOSIS — Z7901 Long term (current) use of anticoagulants: Secondary | ICD-10-CM | POA: Insufficient documentation

## 2021-04-12 DIAGNOSIS — W01198A Fall on same level from slipping, tripping and stumbling with subsequent striking against other object, initial encounter: Secondary | ICD-10-CM | POA: Insufficient documentation

## 2021-04-12 DIAGNOSIS — Y92512 Supermarket, store or market as the place of occurrence of the external cause: Secondary | ICD-10-CM | POA: Diagnosis not present

## 2021-04-12 DIAGNOSIS — I4891 Unspecified atrial fibrillation: Secondary | ICD-10-CM | POA: Insufficient documentation

## 2021-04-12 DIAGNOSIS — Z79899 Other long term (current) drug therapy: Secondary | ICD-10-CM | POA: Diagnosis not present

## 2021-04-12 DIAGNOSIS — S2242XA Multiple fractures of ribs, left side, initial encounter for closed fracture: Secondary | ICD-10-CM | POA: Diagnosis not present

## 2021-04-12 DIAGNOSIS — M79642 Pain in left hand: Secondary | ICD-10-CM | POA: Diagnosis not present

## 2021-04-12 DIAGNOSIS — E119 Type 2 diabetes mellitus without complications: Secondary | ICD-10-CM | POA: Insufficient documentation

## 2021-04-12 DIAGNOSIS — I509 Heart failure, unspecified: Secondary | ICD-10-CM | POA: Insufficient documentation

## 2021-04-12 DIAGNOSIS — S60222A Contusion of left hand, initial encounter: Secondary | ICD-10-CM

## 2021-04-12 DIAGNOSIS — Z7984 Long term (current) use of oral hypoglycemic drugs: Secondary | ICD-10-CM | POA: Diagnosis not present

## 2021-04-12 DIAGNOSIS — S60042A Contusion of left ring finger without damage to nail, initial encounter: Secondary | ICD-10-CM | POA: Insufficient documentation

## 2021-04-12 DIAGNOSIS — S2232XA Fracture of one rib, left side, initial encounter for closed fracture: Secondary | ICD-10-CM | POA: Diagnosis not present

## 2021-04-12 DIAGNOSIS — S60032A Contusion of left middle finger without damage to nail, initial encounter: Secondary | ICD-10-CM | POA: Diagnosis not present

## 2021-04-12 DIAGNOSIS — S20212A Contusion of left front wall of thorax, initial encounter: Secondary | ICD-10-CM | POA: Diagnosis not present

## 2021-04-12 DIAGNOSIS — S29001A Unspecified injury of muscle and tendon of front wall of thorax, initial encounter: Secondary | ICD-10-CM | POA: Diagnosis present

## 2021-04-12 MED ORDER — HYDROCODONE-ACETAMINOPHEN 5-325 MG PO TABS
1.0000 | ORAL_TABLET | Freq: Once | ORAL | Status: AC
  Administered 2021-04-12: 1 via ORAL
  Filled 2021-04-12: qty 1

## 2021-04-12 MED ORDER — HYDROCODONE-ACETAMINOPHEN 5-325 MG PO TABS: 1.0000 | ORAL_TABLET | Freq: Four times a day (QID) | ORAL | 0 refills | Status: DC | PRN

## 2021-04-12 NOTE — Discharge Instructions (Addendum)
Be sure to use the incentive spirometer frequently throughout the day while awake, ideally every hour.   If you develop intractable pain, trouble breathing, cough, coughing up blood, fever, or any other new/concerning symptoms the return to the ER for evaluation.

## 2021-04-12 NOTE — ED Triage Notes (Signed)
Pt has not taken any of his daily medications, pain medication or insulin yet this morning.  Pt did take 1 hydocodone last night with minimal relief.

## 2021-04-12 NOTE — ED Notes (Signed)
Pt requested that wife sign all paperwork but he understood the paperwork.

## 2021-04-12 NOTE — ED Provider Notes (Signed)
[facility] EMERGENCY DEPT Provider Note   CSN: [id number] Arrival date & time: 04/12/21  9470     History  Chief Complaint  Patient presents with   Fall    Rib & hand pain    [name] is a 88 y.o. male.  HPI 88 year old male with multiple comorbidities including chronic A-fib on Eliquis, CHF, CVA, diabetes, and multiple other conditions presents with fall and left hand/left ribs injury.  Yesterday he was walking out of a store and tripped and fell and landed on his hand.  He thinks he also injured his left lateral chest wall.  Hand has become swollen and his third and fourth digits causing him to be unable to be bent and more pain.  He took a hydrocodone with mild relief last night.  The ribs started hurting him worse last night and so he had to sit in a chair.  Hurts to breathe then but otherwise he is not feeling dyspneic.  There is no abdominal/flank pain.  Did not hit his head or lose consciousness.  No hip pain.  Home Medications Prior to Admission medications   Medication Sig Start Date End Date Taking? Authorizing Provider  HYDROcodone-acetaminophen (NORCO) 5-325 MG tablet Take 1 tablet by mouth every 6 (six) hours as needed for severe pain. 04/12/21  Yes Sherwood Gambler, MD  apixaban (ELIQUIS) 5 MG TABS tablet Take 1 tablet (5 mg total) by mouth 2 (two) times daily. 06/08/16   Reyne Dumas, MD  atorvastatin (LIPITOR) 40 MG tablet Take 1 tablet (40 mg total) by mouth daily at 6 PM. 06/08/16   Reyne Dumas, MD  AVODART 0.5 MG capsule Take 0.5 mg by mouth every Monday, Wednesday, and Friday.  07/20/11   [provider]  Cholecalciferol 50 MCG (2000 UT) TABS Take 1 tablet by mouth daily. 05/15/20   [provider]  diltiazem (TIAZAC) 360 MG 24 hr capsule Take 1 capsule by mouth daily. 11/08/19   [provider]  empagliflozin (JARDIANCE) 25 MG TABS tablet Take 1 tablet (25 mg total) by mouth daily before breakfast. 09/03/20   Martinique, Peter  M, MD  fluticasone [facility]) 50 MCG/ACT nasal spray SHAKE LIQUID AND USE 1 SPRAY IN [facility] NOSTRIL DAILY 05/02/20   [name], MD  furosemide (LASIX) 40 MG tablet TAKE 1 TABLET(40 MG) BY MOUTH DAILY 07/15/20   [place], Peter M, MD  gabapentin (NEURONTIN) 300 MG capsule TAKE 1 CAPSULE(300 MG) BY MOUTH THREE TIMES DAILY 11/24/20   [name], MD  insulin glargine (LANTUS) 100 UNIT/ML injection Inject 40 Units into the skin every morning.    [provider]  metFORMIN (GLUCOPHAGE) 500 MG tablet Take 500 mg by mouth 2 (two) times daily with a meal.    [provider]  metoprolol succinate (TOPROL-XL) 100 MG 24 hr tablet Take 1 tablet (100 mg total) by mouth daily. Take with or immediately following a meal. 09/03/20   Martinique, Peter M, MD  nitroGLYCERIN (NITROSTAT) 0.4 MG SL tablet Place 0.4 mg under the tongue every 5 (five) minutes as needed for chest pain (x 3 doses).    [provider]  pantoprazole (PROTONIX) 40 MG tablet TAKE 1 TABLET BY MOUTH DAILY AT NOON 06/04/20   Martin[place], Peter M, MD  potassium chloride SA (KLOR-CON) 20 MEQ tablet TAKE 1 TABLET(20 MEQ) BY MOUTH DAILY 12/22/20   Martin[place], Peter M, MD  PRECISION XTRA TEST STRIPS test strip USE TO TEST BLOOD SUGAR 1 TO 2 TIMES DAILY  05/16/19   [name], MD      Allergies    Erythromycin base, Septra [sulfamethoxazole-trimethoprim], Cefadroxil, Ciprofloxacin, and Erythromycin    Review of Systems   Review of Systems  Gastrointestinal:  Negative for abdominal pain.  Musculoskeletal:  Positive for arthralgias and joint swelling.  Neurological:  Negative for weakness and headaches.   Physical Exam Updated Vital Signs BP (!) 176/85 (BP Location: Right Arm)    Pulse 92    Temp 97.9 F (36.6 C)    Resp 16    SpO2 99%  Physical Exam Vitals and nursing note reviewed.  Constitutional:      Appearance: He is well-developed.  HENT:     Head: Normocephalic and atraumatic.  Cardiovascular:     Rate and Rhythm:  Normal rate. Rhythm irregular.     Pulses:          Radial pulses are 2+ on the left side.     Heart sounds: Normal heart sounds.  Pulmonary:     Effort: Pulmonary effort is normal.     Breath sounds: Normal breath sounds.  Chest:     Chest wall: Tenderness present.       Comments: TTP over inferior anterior/lateral left ribs Abdominal:     General: There is no distension.     Palpations: Abdomen is soft.     Tenderness: There is no abdominal tenderness.  Musculoskeletal:     Left hand: Swelling and tenderness present. No lacerations. Decreased range of motion. Normal sensation.     Comments: 3rd and 4th digits are ecchymotic and swollen. Pain with ROM, 3rd > 4th. Normal sensation. No proximal hand injury  Skin:    General: Skin is warm and dry.  Neurological:     Mental Status: He is alert.    ED Results / Procedures / Treatments   Labs (all labs ordered are listed, but only abnormal results are displayed) Labs Reviewed - No data to display  EKG None  Radiology DG Ribs Unilateral W/Chest Left  Result Date: 04/12/2021 CLINICAL DATA:  Fall with left-sided rib pain. Injury was yesterday. EXAM: LEFT RIBS AND CHEST - 3+ VIEW COMPARISON:  07/03/2020.  Chest CT 02/04/2020 FINDINGS: Acute appearing lateral left seventh rib fracture, essentially nondisplaced. Chronic left rib deformities from old fractures. No evidence of layering hemothorax or pneumothorax but there is extrapleural thickening at the level of injury. Stable heart size and mediastinal contours. Prior CABG. IMPRESSION: 1. Acute lateral left seventh rib fracture with probable adjacent extrapleural hemorrhage. 2. Remote and healed left rib fractures. 3. No pneumothorax. Electronically Signed   By: Jorje Guild M.D.   On: 04/12/2021 10:20   DG Hand Complete Left  Result Date: 04/12/2021 CLINICAL DATA:  Fall, pain EXAM: LEFT HAND - COMPLETE 3+ VIEW COMPARISON:  None. FINDINGS: There is no evidence of fracture or  dislocation. Moderate osteoarthritic pattern arthrosis about the left hand and wrist. Subchondral lucent lesion about the left radial styloid, likely a subchondral degenerative cyst. Soft tissues are unremarkable. IMPRESSION: 1.  No fracture or dislocation of the left hand or wrist. 2. Moderate osteoarthritic pattern arthrosis about the left hand and wrist. Electronically Signed   By: Delanna Ahmadi M.D.   On: 04/12/2021 10:18    Procedures Procedures    Medications Ordered in ED Medications  HYDROcodone-acetaminophen (NORCO/VICODIN) 5-325 MG per tablet 1 tablet (1 tablet Oral Given 04/12/21 1041)    ED Course/ Medical Decision Making/ A&P  Medical Decision Making Amount and/or Complexity of Data Reviewed Radiology: ordered.  Risk Prescription drug management.   Patient with a trip and fall.  This occurred yesterday.  However he did not hit his head or lose consciousness.  While he is on Eliquis, I have a lower suspicion that he has a significant head injury and so CT is not indicated.  His left hand is impressively swollen on the third and fourth fingers but the x-rays have been personally reviewed by myself and there is no obvious fracture/dislocation.  Unfortunately his x-ray of his chest does seem to show a lateral rib fracture.  There is no hemothorax or pneumothorax and so I think there is no emergent need for him to be admitted given he has decent pain control with the hydrocodone given.  He had some leftover hydrocodone but he has essentially run out so we will give him a short course on prescription for pain control.  We will give incentive spirometry.  Recommend ice for the hand.  Otherwise he has a benign abdominal exam so my suspicion of a serious intra-abdominal trauma is pretty low.  Will discharge home with return precautions.        Final Clinical Impression(s) / ED Diagnoses Final diagnoses:  Closed fracture of one rib of left side, initial  encounter  Contusion of left hand, initial encounter    Rx / DC Orders ED Discharge Orders          Ordered    HYDROcodone-acetaminophen (NORCO) 5-325 MG tablet  Every 6 hours PRN        04/12/21 1131              Sherwood Gambler, MD 04/12/21 1135

## 2021-04-12 NOTE — ED Notes (Signed)
Dr Regenia Skeeter in room w/pt now.

## 2021-04-12 NOTE — ED Triage Notes (Signed)
Pt tripped over a threshold at [facility] yesterday, landing first on his left hand, and then hitting his left ribcage on the tile floor.  Denies any SOB but does report increased left rib cage pain with deep inspiration and any movement.[name]

## 2021-04-14 ENCOUNTER — Encounter: Payer: Self-pay | Admitting: Family Medicine

## 2021-04-14 ENCOUNTER — Other Ambulatory Visit: Payer: Self-pay

## 2021-04-14 ENCOUNTER — Ambulatory Visit (INDEPENDENT_AMBULATORY_CARE_PROVIDER_SITE_OTHER): Payer: Medicare Other | Admitting: Family Medicine

## 2021-04-14 VITALS — BP 120/72 | HR 61 | Temp 99.0°F | Ht 66.0 in | Wt 200.0 lb

## 2021-04-14 DIAGNOSIS — S60222D Contusion of left hand, subsequent encounter: Secondary | ICD-10-CM | POA: Diagnosis not present

## 2021-04-14 DIAGNOSIS — I2510 Atherosclerotic heart disease of native coronary artery without angina pectoris: Secondary | ICD-10-CM | POA: Diagnosis not present

## 2021-04-14 DIAGNOSIS — S2232XD Fracture of one rib, left side, subsequent encounter for fracture with routine healing: Secondary | ICD-10-CM

## 2021-04-14 DIAGNOSIS — K5903 Drug induced constipation: Secondary | ICD-10-CM | POA: Diagnosis not present

## 2021-04-14 DIAGNOSIS — Z7901 Long term (current) use of anticoagulants: Secondary | ICD-10-CM

## 2021-04-14 MED ORDER — OXYCODONE-ACETAMINOPHEN 10-325 MG PO TABS: 1.0000 | ORAL_TABLET | Freq: Three times a day (TID) | ORAL | 0 refills | Status: DC | PRN

## 2021-04-14 NOTE — Progress Notes (Signed)
Subjective  CC:  Chief Complaint  Patient presents with   Fall    2/11, Pt fell and bruised his left hand, Cracked rib on left side.     HPI: [name] is a 88 y.o. male who presents to the office today to address the problems listed above in the chief complaint. 88 year old male who tripped while leaving a store on 2/11.  Unfortunately, he was unable to get up.  Ambulance took him to the hospital.  I reviewed ER records.  Left seventh rib fracture with mild hemorrhage and left contusion of the hand.  He was discharged on hydrocodone.  No lab work was done.  No head injury.  Since discharge, he is struggling because of pain.  Hydrocodone is not managing pain well.  Sleep is very hard and disrupted.  He is sitting up in a recliner because it is too difficult to get in and out of the bed.  No breathing issues.  He is using his incentive spirometer.  No pleuritic chest pain.  No palpitations, lightheadedness.  No abdominal pain.  Left hand contusion/swelling is improving.  Range of motion is better.  He is complaining of constipation.  He does take hydrocodone intermittently for back and leg pain. He is on a blood thinner Assessment  1. Fracture of one rib, left side, subsequent encounter for fracture with routine healing   2. Current use of long term anticoagulation   3. Contusion of left hand, subsequent encounter   4. Atherosclerosis of native coronary artery of native heart without angina pectoris   5. Drug induced constipation      Plan  Rib fracture, fall, hand contusion: Extensive counseling done.  Lung exam is unremarkable today.  When not moving, sitting in the wheelchair he appears comfortable, but he has significant pain with mild movements.  Will start Percocet 10/325 to be used every 4-6 hours as needed for pain.  Discussed stopping the hydrocodone.  Continue incentive spirometry.  Give time to heal.  No current complications identified.  At risk for pneumonia. Left hand  contusion: Improving. Chronic anticoagulation due to A-fib: Check CBC to ensure stable. Heart disease and heart failure: Does have cardiology follow-up next week.  Labs ordered for cardiology review as well. Narcotic induced constipation: Start MiraLAX and Colace twice daily.  Monitor education given  Follow up: As scheduled 05/11/2021  Orders Placed This Encounter  Procedures   CBC with Differential/Platelet   Comprehensive metabolic panel   Lipid panel   Meds ordered this encounter  Medications   oxyCODONE-acetaminophen (PERCOCET) 10-325 MG tablet    Sig: Take 1 tablet by mouth every 8 (eight) hours as needed for pain. Do not take with hydrocodone    Dispense:  20 tablet    Refill:  0      I reviewed the patients updated PMH, FH, and SocHx.    Patient Active Problem List   Diagnosis Date Noted   Mild cognitive impairment 03/05/2021    Priority: High   Moderate nonproliferative diabetic retinopathy of left eye ([place]) 06/18/2020    Priority: High   Stage 3b chronic kidney disease ([place]) 05/26/2020    Priority: High   Aortic stenosis, moderate 05/22/2019    Priority: High   Chronic diastolic CHF (congestive heart failure) ([place]) 03/14/2019    Priority: High   Spinal stenosis of lumbar region 02/21/2018    Priority: High   Diabetic peripheral neuropathy associated with type 2 diabetes mellitus ([place]) 05/05/2017  Priority: High   Atherosclerotic heart disease of native coronary artery without angina pectoris 06/13/2016    Priority: High   CVA (cerebral vascular accident) ([place]) 06/06/2016    Priority: High   Recurrent coronary arteriosclerosis after percutaneous transluminal coronary angioplasty 04/18/2015    Priority: High   CAD S/P PCI- Nov 2016     Priority: High   Current use of long term anticoagulation 02/06/2013    Priority: High   Chronic atrial fibrillation ([place]) 07/30/2011    Priority: High   Hx of CABG x 2 1990     Priority: High   Essential (primary)  hypertension     Priority: High   Diabetes mellitus with diabetic nephropathy, with long-term current use of insulin (HCC)     Priority: High   Mixed hyperlipidemia     Priority: High   Thoracic aortic aneurysm without rupture 05/26/2020    Priority: Medium    Degeneration of lumbar intervertebral disc 02/21/2018    Priority: Medium    Chronic vertigo 03/15/2014    Priority: Medium    Obesity (BMI 30.0-34.9) 01/31/2012    Priority: Medium    Enlarged prostate without lower urinary tract symptoms (luts) 01/04/2011    Priority: Medium    Gastro-esophageal reflux disease without esophagitis 12/07/2010    Priority: Medium    Epistaxis, recurrent 04/11/2015    Priority: Low   Osteoarthritis, knee 01/31/2012    Priority: Low   Allergic rhinitis 10/28/2011    Priority: Low   Hearing loss 06/08/2011    Priority: Low   Primary osteoarthritis of hand 06/08/2011    Priority: Low   Benign hematuria 08/27/2008    Priority: Low   Stable hemispheric central retinal vein occlusion (CRVO) of right eye 07/21/2020   Lower extremity edema 03/14/2019   Current Meds  Medication Sig   apixaban (ELIQUIS) 5 MG TABS tablet Take 1 tablet (5 mg total) by mouth 2 (two) times daily.   atorvastatin (LIPITOR) 40 MG tablet Take 1 tablet (40 mg total) by mouth daily at 6 PM.   AVODART 0.5 MG capsule Take 0.5 mg by mouth every Monday, Wednesday, and Friday.    Cholecalciferol 50 MCG (2000 UT) TABS Take 1 tablet by mouth daily.   diltiazem (TIAZAC) 360 MG 24 hr capsule Take 1 capsule by mouth daily.   empagliflozin (JARDIANCE) 25 MG TABS tablet Take 1 tablet (25 mg total) by mouth daily before breakfast.   fluticasone (FLONASE) 50 MCG/ACT nasal spray SHAKE LIQUID AND USE 1 SPRAY IN EACH NOSTRIL DAILY   furosemide (LASIX) 40 MG tablet TAKE 1 TABLET(40 MG) BY MOUTH DAILY   gabapentin (NEURONTIN) 300 MG capsule TAKE 1 CAPSULE(300 MG) BY MOUTH THREE TIMES DAILY   HYDROcodone-acetaminophen (NORCO) 5-325 MG tablet  Take 1 tablet by mouth every 6 (six) hours as needed for severe pain.   insulin glargine (LANTUS) 100 UNIT/ML injection Inject 40 Units into the skin every morning.   metFORMIN (GLUCOPHAGE) 500 MG tablet Take 500 mg by mouth 2 (two) times daily with a meal.   metoprolol succinate (TOPROL-XL) 100 MG 24 hr tablet Take 1 tablet (100 mg total) by mouth daily. Take with or immediately following a meal.   nitroGLYCERIN (NITROSTAT) 0.4 MG SL tablet Place 0.4 mg under the tongue every 5 (five) minutes as needed for chest pain (x 3 doses).   oxyCODONE-acetaminophen (PERCOCET) 10-325 MG tablet Take 1 tablet by mouth every 8 (eight) hours as needed for pain. Do not take with hydrocodone  pantoprazole (PROTONIX) 40 MG tablet TAKE 1 TABLET BY MOUTH DAILY AT NOON   potassium chloride SA (KLOR-CON) 20 MEQ tablet TAKE 1 TABLET(20 MEQ) BY MOUTH DAILY   PRECISION XTRA TEST STRIPS test strip USE TO TEST BLOOD SUGAR 1 TO 2 TIMES DAILY    Allergies: Patient is allergic to erythromycin base, septra [sulfamethoxazole-trimethoprim], cefadroxil, ciprofloxacin, and erythromycin. Family History: Patient family history includes Brain cancer in his father; Throat cancer in his brother. Social History:  Patient  reports that he quit smoking about 62 years ago. His smoking use included cigarettes. He has a 30.00 pack-year smoking history. He has never used smokeless tobacco. He reports that he does not drink alcohol and does not use drugs.  Review of Systems: Constitutional: Negative for fever malaise or anorexia Cardiovascular: negative for chest pain Respiratory: negative for SOB or persistent cough Gastrointestinal: negative for abdominal pain  Objective  Vitals: BP 120/72    Pulse 61    Temp 99 F (37.2 C) (Temporal)    Ht 5\' 6"  (1.676 m)    Wt 200 lb (90.7 kg)    SpO2 96%    BMI 32.28 kg/m  General: no acute distress if sitting still, in a wheelchair, pain with movement., A&Ox3  Cardiovascular:  RRR without  murmur or gallop.  Respiratory: Clear breath sounds but shallow bilaterally, no rales.  No wheezing, difficult to take deep breaths due to pain. Skin:  Warm, no rashes Left hand third and fourth fingers with chronic deformity due to prior fracture, ecchymosis and mild swelling present.  Reviewed chest x-ray, rib x-ray detail, left hand x-ray.  Commons side effects, risks, benefits, and alternatives for medications and treatment plan prescribed today were discussed, and the patient expressed understanding of the given instructions. Patient is instructed to call or message via MyChart if he/she has any questions or concerns regarding our treatment plan. No barriers to understanding were identified. We discussed Red Flag symptoms and signs in detail. Patient expressed understanding regarding what to do in case of urgent or emergency type symptoms.  Medication list was reconciled, printed and provided to the patient in AVS. Patient instructions and summary information was reviewed with the patient as documented in the AVS. This note was prepared with assistance of Dragon voice recognition software. Occasional wrong-word or sound-a-like substitutions may have occurred due to the inherent limitations of voice recognition software  This visit occurred during the SARS-CoV-2 public health emergency.  Safety protocols were in place, including screening questions prior to the visit, additional usage of staff PPE, and extensive cleaning of exam room while observing appropriate contact time as indicated for disinfecting solutions.

## 2021-04-14 NOTE — Patient Instructions (Signed)
Please follow up as scheduled for your next visit with me: 05/11/2021   If you have any questions or concerns, please don't hesitate to send me a message via MyChart or call the office at [phone]. Thank you for visiting with [place] today! It's our pleasure caring for you.   Start miralax once or twice a day along with colace twice daily for your constipation. Drink plenty of water.   Use the stronger pain medication carefully.   I have ordered labs for Dr. Martinique as well.

## 2021-04-15 LAB — CBC WITH DIFFERENTIAL/PLATELET
Basophils Absolute: 0.1 10*3/uL (ref 0.0–0.1)
Basophils Relative: 1 % (ref 0.0–3.0)
Eosinophils Absolute: 0.1 10*3/uL (ref 0.0–0.7)
Eosinophils Relative: 1.5 % (ref 0.0–5.0)
HCT: 40.8 % (ref 39.0–52.0)
Hemoglobin: 12.8 g/dL — ABNORMAL LOW (ref 13.0–17.0)
Lymphocytes Relative: 14.4 % (ref 12.0–46.0)
Lymphs Abs: 1.4 10*3/uL (ref 0.7–4.0)
MCHC: 31.3 g/dL (ref 30.0–36.0)
MCV: 80.4 fl (ref 78.0–100.0)
Monocytes Absolute: 0.8 10*3/uL (ref 0.1–1.0)
Monocytes Relative: 8.2 % (ref 3.0–12.0)
Neutro Abs: 7.3 10*3/uL (ref 1.4–7.7)
Neutrophils Relative %: 74.9 % (ref 43.0–77.0)
Platelets: 285 10*3/uL (ref 150.0–400.0)
RBC: 5.07 Mil/uL (ref 4.22–5.81)
RDW: 17 % — ABNORMAL HIGH (ref 11.5–15.5)
WBC: 9.7 10*3/uL (ref 4.0–10.5)

## 2021-04-15 LAB — COMPREHENSIVE METABOLIC PANEL
ALT: 8 U/L (ref 0–53)
AST: 11 U/L (ref 0–37)
Albumin: 4.6 g/dL (ref 3.5–5.2)
Alkaline Phosphatase: 66 U/L (ref 39–117)
BUN: 27 mg/dL — ABNORMAL HIGH (ref 6–23)
CO2: 24 mEq/L (ref 19–32)
Calcium: 9.7 mg/dL (ref 8.4–10.5)
Chloride: 98 mEq/L (ref 96–112)
Creatinine, Ser: 1.33 mg/dL (ref 0.40–1.50)
GFR: 46.93 mL/min — ABNORMAL LOW (ref >60.00)
Glucose, Bld: 374 mg/dL — ABNORMAL HIGH (ref 70–99)
Potassium: 4.5 mEq/L (ref 3.5–5.1)
Sodium: 140 mEq/L (ref 135–145)
Total Bilirubin: 0.8 mg/dL (ref 0.2–1.2)
Total Protein: 7.7 g/dL (ref 6.0–8.3)

## 2021-04-15 LAB — LIPID PANEL
Cholesterol: 154 mg/dL (ref 0–200)
HDL: 54.7 mg/dL (ref >39.00)
LDL Cholesterol: 75 mg/dL (ref 0–99)
NonHDL: 99.16
Total CHOL/HDL Ratio: 3
Triglycerides: 119 mg/dL (ref 0.0–149.0)
VLDL: 23.8 mg/dL (ref 0.0–40.0)

## 2021-04-16 NOTE — Progress Notes (Signed)
Spoke with patient, [name] a verbalized understanding.

## 2021-04-16 NOTE — Progress Notes (Signed)
Please call patient: I have reviewed his/her lab results.  Please inform patient that his blood count is stable.  There is no signs of bleeding.  His cardiologist will be able to see his blood work.  Including his cholesterol levels. Please ask him how he is doing on the pain medication.  If it is making him sleepy or confused, he may use half a pill at a time.

## 2021-04-19 NOTE — Progress Notes (Signed)
Cardiology Office Note   Date:  04/24/2021   ID:  [name], DOB Jul 21, 1928, MRN [id number]  PCP:  [name], MD  Cardiologist:  Zonia Caplin [place], MD EP: None  Chief Complaint  Patient presents with   Atrial Fibrillation       History of Present Illness: [name] is a 88 y.o. male with PMH of CAD s/p CABG in 1990 with subsequent DES to RCA in 2006, permanent atrial fibrillation, chronic diastolic CHF, aortic stenosis, HTN, HLD, carotid artery disease, DM type 2, CVA, who presents for  follow-up.   He was  admitted to the hospital 05/13/19-05/18/19 for acute respiratory failure 2/2 PNA and pleural effusion, managed with IV antibiotics and a thoracentesis. Cardiology followed that admission for assistance with atrial fibrillation with RVR and he was discharged home on diltiazem 360mg  and metoprolol succinate 100mg  BID for rate control, apixaban 5mg  BID for stroke ppx, and lasix 40mg  daily for CHF. Echo 05/14/19 showed EF 60-65%, moderate concentric LVH, indeterminate LV diastolic function, mild AI, moderate AS, and ascending aortic aneurysm of 4.8cm (up from 4.3cm 09/2018).   He tripped and fell on Feb 11 with soft tissue injury to his left hand and a lateral rib fracture. He was initially taking oxycodone for pain but felt this was too strong. Then switched to hydrocodone and has been resting well with this. His PCP Dr Jonni Sanger has been out of the office and he needs some over the weekend until she returns.   He is doing well from a cardiac standpoint.  Still has rib pain. Also notes pain in his legs.  No palpitations, dyspnea, chest pain. No edema.     Past Medical History:  Diagnosis Date   Abdominal pain    Acute renal failure ([place])     resolved   Arthritis    "hands & legs" (11/'10/2014)   Ataxia    Atrial fibrillation ([facility])    Bladder outlet obstruction    Bladder outlet obstruction    BPH (benign prostatic hyperplasia)    CAD (coronary artery disease)    a.  CABG IN 1989. b. 01/08/2015 CTO of ost LAD, LIMA to LAD not visualized but assumed patent given myoview finding, occluded SVG to diagonal, 99% mid RCA tx w/ SYNERGY DES 3X28 mm   Constipation    Diabetes mellitus type 2, insulin dependent ([place])    Epistaxis, recurrent Feb 2017   GERD (gastroesophageal reflux disease)    HTN (hypertension)    Hx of bacterial pneumonia    Hyperlipemia    Kidney stones    Mild aortic stenosis    Mild cognitive impairment 03/05/2021   MMSE 26/30 and 6CIT 9 03/2021 AWV   Pneumonia 04/2019   Rib fractures    left rib fractures being treated with pain medications   Stented coronary artery Nov 2016   RCA DES   Urinary tract infection     Enterococcus    Past Surgical History:  Procedure Laterality Date   [place] N/A 01/08/2015   Procedure: Left Heart Cath and Cors/Grafts Angiography;  Surgeon: Jaymes Hang M Martin[place], MD;  Location: [facility];  Service: Cardiovascular;  Laterality: N/A;   CARDIAC CATHETERIZATION  01/08/2015   Procedure: Coronary Stent Intervention;  Surgeon: Charleene Callegari M [place], MD;  Location: [facility] [facility];  Service: Cardiovascular;;   CARPAL TUNNEL RELEASE Right 08/2010   CATARACT EXTRACTION W/ INTRAOCULAR LENS  IMPLANT, BILATERAL Bilateral  CORONARY ANGIOPLASTY  01/08/15   RCA DES   CORONARY ARTERY BYPASS GRAFT  1989   "CABG X 2"   IR THORACENTESIS ASP PLEURAL SPACE W/IMG GUIDE  05/14/2019   JOINT REPLACEMENT     TONSILLECTOMY     TOTAL HIP ARTHROPLASTY Right 2000     Current Outpatient Medications  Medication Sig Dispense Refill   apixaban (ELIQUIS) 5 MG TABS tablet Take 1 tablet (5 mg total) by mouth 2 (two) times daily. 60 tablet 2   atorvastatin (LIPITOR) 40 MG tablet Take 1 tablet (40 mg total) by mouth daily at 6 PM. 30 tablet 2   AVODART 0.5 MG capsule Take 0.5 mg by mouth every Monday, Wednesday, and Friday.      Cholecalciferol 50 MCG (2000 UT) TABS Take 1 tablet by mouth  daily.     diltiazem (TIAZAC) 360 MG 24 hr capsule Take 1 capsule by mouth daily.     empagliflozin (JARDIANCE) 25 MG TABS tablet Take 1 tablet (25 mg total) by mouth daily before breakfast. 30 tablet    fluticasone (FLONASE) 50 MCG/ACT nasal spray SHAKE LIQUID AND USE 1 SPRAY IN EACH NOSTRIL DAILY 16 g 5   furosemide (LASIX) 40 MG tablet TAKE 1 TABLET(40 MG) BY MOUTH DAILY 90 tablet 3   gabapentin (NEURONTIN) 300 MG capsule TAKE 1 CAPSULE(300 MG) BY MOUTH THREE TIMES DAILY 270 capsule 3   HYDROcodone-acetaminophen (NORCO) 5-325 MG tablet Take 1 tablet by mouth every 6 (six) hours as needed for severe pain. 10 tablet 0   insulin glargine (LANTUS) 100 UNIT/ML injection Inject 40 Units into the skin every morning.     metFORMIN (GLUCOPHAGE) 500 MG tablet Take 500 mg by mouth 2 (two) times daily with a meal.     metoprolol succinate (TOPROL-XL) 100 MG 24 hr tablet Take 1 tablet (100 mg total) by mouth daily. Take with or immediately following a meal. 180 tablet 3   nitroGLYCERIN (NITROSTAT) 0.4 MG SL tablet Place 0.4 mg under the tongue every 5 (five) minutes as needed for chest pain (x 3 doses).     oxyCODONE-acetaminophen (PERCOCET) 10-325 MG tablet Take 1 tablet by mouth every 8 (eight) hours as needed for pain. Do not take with hydrocodone 20 tablet 0   pantoprazole (PROTONIX) 40 MG tablet TAKE 1 TABLET BY MOUTH DAILY AT NOON 90 tablet 3   potassium chloride SA (KLOR-CON) 20 MEQ tablet TAKE 1 TABLET(20 MEQ) BY MOUTH DAILY 90 tablet 3   PRECISION XTRA TEST STRIPS test strip USE TO TEST BLOOD SUGAR 1 TO 2 TIMES DAILY 200 strip 6   No current facility-administered medications for this visit.    Allergies:   Erythromycin base, Septra [sulfamethoxazole-trimethoprim], Cefadroxil, Ciprofloxacin, and Erythromycin    Social History:  The patient  reports that he quit smoking about 62 years ago. His smoking use included cigarettes. He has a 30.00 pack-year smoking history. He has never used smokeless  tobacco. He reports that he does not drink alcohol and does not use drugs.   Family History:  The patient's family history includes Brain cancer in his father; Throat cancer in his brother.    ROS:  Please see the history of present illness.   Otherwise, review of systems are positive for none.   All other systems are reviewed and negative.    PHYSICAL EXAM: VS:  BP (!) 145/80    Pulse 76    Ht 5\' 5"  (1.651 m)    Wt 200 lb (90.7 kg)  SpO2 97%    BMI 33.28 kg/m  , BMI Body mass index is 33.28 kg/m. GEN: Well nourished, well developed, in no acute distress. Seen in a wheelchair.  HEENT: normal Neck: no JVD, carotid bruits, or masses Cardiac: IRIR; gr 2/6 systolic murmur RUSB>>apex, rubs, or gallops, trace LE edema  Respiratory:  clear to auscultation bilaterally, normal work of breathing GI: soft, nontender, nondistended, + BS MS: no deformity or atrophy. No biceps tenderness Skin: warm and dry, no rash Neuro:  Strength and sensation are intact Psych: euthymic mood, full affect   EKG:  EKG is  ordered today. Afib rate 76. T wave inversion inferiorly. No change from prior. I have personally reviewed and interpreted this study.     Recent Labs: 04/14/2021: ALT 8; BUN 27; Creatinine, Ser 1.33; Hemoglobin 12.8; Platelets 285.0; Potassium 4.5; Sodium 140    Lipid Panel    Component Value Date/Time   CHOL 154 04/14/2021 1200   TRIG 119.0 04/14/2021 1200   HDL 54.70 04/14/2021 1200   CHOLHDL 3 04/14/2021 1200   VLDL 23.8 04/14/2021 1200   LDLCALC 75 04/14/2021 1200      Wt Readings from Last 3 Encounters:  04/24/21 200 lb (90.7 kg)  04/14/21 200 lb (90.7 kg)  03/05/21 209 lb 9.6 oz (95.1 kg)      Other studies Reviewed: Additional studies/ records that were reviewed today include:   2D echo 05/14/2019 IMPRESSIONS    1. Left ventricular ejection fraction, by estimation, is 60 to 65%. The  left ventricle has normal function. The left ventricle has no regional  wall  motion abnormalities. There is moderate concentric left ventricular  hypertrophy. Left ventricular  diastolic parameters are indeterminate.   2. Right ventricular systolic function is normal. The right ventricular  size is normal.   3. The mitral valve is normal in structure. No evidence of mitral valve  regurgitation. No evidence of mitral stenosis.   4. The aortic valve is normal in structure. Aortic valve regurgitation is  mild. Moderate aortic valve stenosis. Aortic valve area, by VTI measures  1.27 cm. Aortic valve mean gradient measures 19.3 mmHg. Aortic valve Vmax  measures 2.75 m/s.   5. Compared with th echo 09/2018, ascending aorta has increased from 4.3  cm to 4.8cm. . Aortic dilatation noted. There is moderate dilatation of  the ascending aorta measuring 48 mm.   6. The inferior vena cava is normal in size with greater than 50%  respiratory variability, suggesting right atrial pressure of 3 mmHg.   Left heart catheterization 2016: Prox RCA lesion, 40% stenosed. LM lesion, 20% stenosed. Ost LAD to Prox LAD lesion, 100% stenosed. SVG . Origin lesion, 100% stenosed. Mid RCA-2 lesion, 70% stenosed. Mid RCA-1 lesion, 99% stenosed. Post intervention, there is a 0% residual stenosis.   1. Severe 2 vessel obstructive CAD. CTO of the origin of the LAD. Critical mid RCA stenosis. 2. Occluded SVG to the diagonal 3. LIMA to the LAD was not visualized but assumed patent based on clinical history and Myoview findings. 4. Normal LV EDP. 5. Successful stenting of the Mid RCA with DES. Very difficult procedure due to vessel tortuosity.   Plan: DAPT with ASA and Plavix for one month then stop ASA and continue Plavix for at least one year. Resume Coumadin tomorrow. Will assess LV function with an Echo. Stop prilosec and start protonix. Anticipate DC in am if stable. Patient noted to be bradycardic throughout case so will reduce metoprolol to 50 mg  daily.  CT CHEST WITHOUT CONTRAST    TECHNIQUE: Multidetector CT imaging of the chest was performed following the standard protocol without IV contrast.   COMPARISON:  CT angiography from June 02, 2016 and from October 03, 2017   FINDINGS: Cardiovascular: Post median sternotomy for CABG. Ascending thoracic aorta measuring 4.3 cm with stable appearance. Calcified atheromatous plaque of the thoracic aorta and its branches in the chest.   Heart size is stable, extensive calcified coronary artery disease post CABG and LIMA grafting.   Engorged central pulmonary vasculature 3.2 cm caliber of the main pulmonary artery Limited assessment of cardiovascular structures given lack of intravenous contrast.   Mediastinum/Nodes: Thoracic inlet structures are normal. No axillary lymphadenopathy. No mediastinal lymphadenopathy. No gross hilar lymphadenopathy.   Lungs/Pleura: Bandlike opacity with mild ground-glass attenuation in the LEFT lung base. Bandlike changes are present in the LEFT lower lobe. Resolution of pleural fluid seen on previous chest x-rays from March of 2021   Pulmonary nodule in the superior segment of the RIGHT lower lobe 6 mm unchanged from previous imaging (image 49, series 3) no consolidation. No pleural effusion.   (Image 72, series 3) 4 mm nodule with some surrounding ground-glass opacity in the central LEFT lower lobe   Airways are patent.   Upper Abdomen: Incidental imaging of upper abdominal contents with signs of vascular disease of the abdominal aorta. Small hiatal hernia. No acute upper abdominal process.   Musculoskeletal: No acute bone finding. No destructive bone process.   IMPRESSION: 1. Stable appearance of the mild aneurysmal dilation ascending thoracic aorta measuring 4.3 cm. Recommend annual imaging followup by CTA or MRA. This recommendation follows 2010 ACCF/AHA/AATS/ACR/ASA/SCA/SCAI/SIR/STS/SVM Guidelines for the Diagnosis and Management of Patients with Thoracic Aortic  Disease. Circulation. 2010; 121: G254-Y706. Aortic aneurysm NOS (ICD10-I71.9) 2. Stable 6 mm nodule in the superior segment of the RIGHT lower lobe. 3. 4 mm nodule with some surrounding ground-glass opacity in the central LEFT lower lobe. This is new compared to prior imaging. No follow-up needed if patient is low-risk. Non-contrast chest CT can be considered in 12 months if patient is high-risk. This recommendation follows the consensus statement: Guidelines for Management of Incidental Pulmonary Nodules Detected on CT Images: From the Fleischner Society 2017; Radiology 2017; 284:228-243. 4. Resolution of pleural fluid seen on previous chest x-rays from March of 2021. Bandlike opacity in the area of prior LEFT lower lobe airspace disease suggests mixture of atelectasis and post infectious scarring. 5. Stable signs of vascular disease of the thoracic aorta and its branches in the chest. 6. Aortic atherosclerosis.   Aortic Atherosclerosis (ICD10-I70.0).   Aortic aneurysm NOS (ICD10-I71.9).     Electronically Signed   By: Zetta Bills M.D.   On: 02/05/2020 08:23  Echo 06/10/20: IMPRESSIONS     1. Left ventricular ejection fraction, by estimation, is 60 to 65%. The  left ventricle has normal function. The left ventricle has no regional  wall motion abnormalities. There is moderate left ventricular hypertrophy.  Left ventricular diastolic function   could not be evaluated.   2. Right ventricular systolic function is mildly reduced. The right  ventricular size is normal. There is moderately elevated pulmonary artery  systolic pressure.   3. Left atrial size was severely dilated.   4. Right atrial size was moderately dilated.   5. The mitral valve is abnormal. Trivial mitral valve regurgitation.   6. The aortic valve is tricuspid. There is moderate calcification of the  aortic valve. Aortic valve regurgitation is  trivial. Mild to moderate  aortic valve stenosis. Aortic valve  area, by VTI measures 1.47 cm. Aortic  valve mean gradient measures 18.0  mmHg. Aortic valve Vmax measures 2.78 m/s. DI is 0.30.   7. Aortic dilatation noted. There is mild dilatation of the aortic root,  measuring 43 mm. There is mild dilatation of the ascending aorta,  measuring 43 mm.   8. The inferior vena cava is normal in size with greater than 50%  respiratory variability, suggesting right atrial pressure of 3 mmHg.   Comparison(s): No significant change from prior study. 05/14/2019: LVEF  60-65%, moderate LVH, moderate AS - mean gradient 19.3 mmHg.   ASSESSMENT AND PLAN:  1. CAD s/p CABG in 1990 with DES to RCA in 2006: Not on aspirin given need for anticoagulation - Continue statin - Continue metoprolol  2. Permanent atrial fibrillation: HR is well controlled.  - No complaints of bleeding - continue metoprolol and diltiazem  - Continue eliquis for stroke ppx. (does not meet requirements for reduced dose)  3. Chronic diastolic CHF: no volume overload complaints and he appears euvolemic on exam - Continue lasix 40mg  daily and potassium - Continue metoprolol succinate - Continue to limit salt intake  4. HTN: BP is well controlled - Continue metoprolol succinate, diltiazem, and lasix  5. Aortic stenosis: moderate on echo 05/14/19- repeat in April 2022 without change.  - Continue annual surveillance echo's   6. Ascending aortic aneurysm: last echo 05/14/19 showed 4.8cm aneurysm, up from 4.3 09/2018. More recent CT 02/05/20 showed it was 4.3 cm. Stable. Stable by Echo.   7. Recent mechanical fall with rib fracture. Will give Rx for hydrocodone A# 10 tablets until he can get back with Dr Jonni Sanger.     Current medicines are reviewed at length with the patient today.  The patient does not have concerns regarding medicines.  The following changes have been made:  As above  Labs/ tests ordered today include:   Orders Placed This Encounter  Procedures   EKG 12-Lead       Disposition:   FU 6 months   Signed, Shawnda Mauney [place], MD  04/24/2021 8:19 AM

## 2021-04-23 ENCOUNTER — Telehealth: Payer: Self-pay

## 2021-04-23 ENCOUNTER — Telehealth: Payer: Self-pay | Admitting: Family Medicine

## 2021-04-23 NOTE — Telephone Encounter (Signed)
He just saw his PCP last week for this. He needs to schedule an appointment with her to discuss.  [name]. [name], MD 04/23/2021 2:33 PM

## 2021-04-23 NOTE — Telephone Encounter (Signed)
I Called to speak with patient regarding hydrocodone medication refill from phone call I received from patient today. Dr. [name] stated for patient to follow up with his PCP next week on refills regarding medication, I let patient know and Patient  was very angry and stated ''Im just suppose to be in fucking pain?, you fucking bitch'' and hung up. A Message has been sent to jeanette.

## 2021-04-23 NOTE — Telephone Encounter (Signed)
Patient called in asking to speak with Dr. Tamela Oddi nurse to refill his prescription for hydrocodizone.

## 2021-04-23 NOTE — Telephone Encounter (Signed)
Spoke with patient.

## 2021-04-23 NOTE — Telephone Encounter (Signed)
Pt called stating he would like a refill on his Hydrocodone. He stated he does not want to take his oxycodone anymore because his hydrocodone helps his pain better. I let pt know Dr. Jonni Sanger is out of office until Monday and I will send a message to one of our other providers and give him a call back.

## 2021-04-24 ENCOUNTER — Other Ambulatory Visit: Payer: Self-pay

## 2021-04-24 ENCOUNTER — Encounter: Payer: Self-pay | Admitting: Cardiology

## 2021-04-24 ENCOUNTER — Ambulatory Visit (INDEPENDENT_AMBULATORY_CARE_PROVIDER_SITE_OTHER): Payer: Medicare Other | Admitting: Cardiology

## 2021-04-24 VITALS — BP 145/80 | HR 76 | Ht 65.0 in | Wt 200.0 lb

## 2021-04-24 DIAGNOSIS — I350 Nonrheumatic aortic (valve) stenosis: Secondary | ICD-10-CM | POA: Diagnosis not present

## 2021-04-24 DIAGNOSIS — I482 Chronic atrial fibrillation, unspecified: Secondary | ICD-10-CM

## 2021-04-24 DIAGNOSIS — E7800 Pure hypercholesterolemia, unspecified: Secondary | ICD-10-CM

## 2021-04-24 DIAGNOSIS — I7121 Aneurysm of the ascending aorta, without rupture: Secondary | ICD-10-CM | POA: Diagnosis not present

## 2021-04-24 DIAGNOSIS — I10 Essential (primary) hypertension: Secondary | ICD-10-CM | POA: Diagnosis not present

## 2021-04-24 DIAGNOSIS — I2510 Atherosclerotic heart disease of native coronary artery without angina pectoris: Secondary | ICD-10-CM | POA: Diagnosis not present

## 2021-04-24 MED ORDER — HYDROCODONE-ACETAMINOPHEN 5-325MG PREPACK (~~LOC~~: ORAL_TABLET | ORAL | 0 refills | Status: DC

## 2021-04-24 NOTE — Telephone Encounter (Signed)
Called pt to advise to schedule an office visit w/ Dr Jonni Sanger to discuss and no answer and no vm to leave msg. If pt calls back please advise

## 2021-04-24 NOTE — Addendum Note (Signed)
Addended by: Martin[place], Jarquavious Fentress M on: 04/24/2021 08:27 AM   Modules accepted: Orders

## 2021-04-27 ENCOUNTER — Encounter (INDEPENDENT_AMBULATORY_CARE_PROVIDER_SITE_OTHER): Payer: Medicare Other | Admitting: Ophthalmology

## 2021-05-11 ENCOUNTER — Ambulatory Visit (INDEPENDENT_AMBULATORY_CARE_PROVIDER_SITE_OTHER): Payer: Medicare Other | Admitting: Family Medicine

## 2021-05-11 ENCOUNTER — Encounter: Payer: Self-pay | Admitting: Family Medicine

## 2021-05-11 VITALS — BP 167/80 | HR 77 | Temp 97.8°F | Ht 65.0 in | Wt 206.0 lb

## 2021-05-11 DIAGNOSIS — I482 Chronic atrial fibrillation, unspecified: Secondary | ICD-10-CM

## 2021-05-11 DIAGNOSIS — E1121 Type 2 diabetes mellitus with diabetic nephropathy: Secondary | ICD-10-CM | POA: Diagnosis not present

## 2021-05-11 DIAGNOSIS — I10 Essential (primary) hypertension: Secondary | ICD-10-CM | POA: Diagnosis not present

## 2021-05-11 DIAGNOSIS — N1832 Chronic kidney disease, stage 3b: Secondary | ICD-10-CM

## 2021-05-11 DIAGNOSIS — I5032 Chronic diastolic (congestive) heart failure: Secondary | ICD-10-CM

## 2021-05-11 DIAGNOSIS — Z794 Long term (current) use of insulin: Secondary | ICD-10-CM | POA: Diagnosis not present

## 2021-05-11 DIAGNOSIS — Z9861 Coronary angioplasty status: Secondary | ICD-10-CM | POA: Diagnosis not present

## 2021-05-11 DIAGNOSIS — I2510 Atherosclerotic heart disease of native coronary artery without angina pectoris: Secondary | ICD-10-CM | POA: Diagnosis not present

## 2021-05-11 DIAGNOSIS — E1142 Type 2 diabetes mellitus with diabetic polyneuropathy: Secondary | ICD-10-CM

## 2021-05-11 LAB — POCT GLYCOSYLATED HEMOGLOBIN (HGB A1C): Hemoglobin A1C: 7.6 % — AB (ref 4.0–5.6)

## 2021-05-11 NOTE — Progress Notes (Signed)
Subjective  CC:  Chief Complaint  Patient presents with   Hypertension    Pt is not fasting.   Diabetes    HPI: [name] is a 88 y.o. male who presents to the office today for follow up of diabetes and problems listed above in the chief complaint.  Diabetes follow up: His diabetic control is reported as Unchanged. Feeling well. No sxs of high blood sugars.  He denies exertional CP or SOB or symptomatic hypoglycemia. He denies foot sores or paresthesias.  HTN: elevated reading today but near normal at recent cards visit: he is upset today. No cp. Reviewed cards notes HLD is controlled  CAD and AS: stable.  C/o 1st mcp pain on left due to arthritis.  Recent rib fracture: now doing well off all pain meds.   Wt Readings from Last 3 Encounters:  05/11/21 206 lb (93.4 kg)  04/24/21 200 lb (90.7 kg)  04/14/21 200 lb (90.7 kg)    BP Readings from Last 3 Encounters:  05/11/21 (!) 167/80  04/24/21 (!) 145/80  04/14/21 120/72    Assessment  1. Type 2 diabetes mellitus with diabetic nephropathy, with long-term current use of insulin (HCC)   2. Chronic diastolic CHF (congestive heart failure) (HCC)   3. Stage 3b chronic kidney disease (HCC)   4. Diabetic peripheral neuropathy associated with type 2 diabetes mellitus (HCC)   5. CAD S/P PCI- Nov 2016   6. Chronic atrial fibrillation (HCC)   7. Essential (primary) hypertension      Plan  Diabetes is currently well controlled. Doing well. Continue current meds HDL and CAD: stable HTN: will monitor. Continue current meds. No changes today. Mild stress related elevation today.  On eloquis Chf on lasix Rib fractures: now pain free. No complications. Hand OA: voltaren gel and tylenol  Follow up: 6 mo for recheck. No orders of the defined types were placed in this encounter.  No orders of the defined types were placed in this encounter.     Immunization History  Administered Date(s) Administered   Fluad Quad(high Dose  65+) 11/02/2018, 11/30/2019, 11/07/2020   Influenza Split 11/14/2007, 01/01/2009, 12/23/2009, 11/29/2012, 10/31/2015   Influenza, High Dose Seasonal PF 11/02/2012, 12/12/2013, 11/15/2014, 11/24/2017, 01/17/2018   Influenza, Seasonal, Injecte, Preservative Fre 11/18/2015   Influenza,inj,Quad PF,6+ Mos 02/15/2017   Influenza,trivalent, recombinat, inj, PF 12/07/2010   Influenza-Unspecified 10/28/2011, 11/30/2014, 02/15/2017   PFIZER(Purple Top)SARS-COV-2 Vaccination 03/23/2019, 04/14/2019, 12/06/2019, 06/21/2020   Pfizer Covid-19 Vaccine Bivalent Booster 69yrs & up 01/02/2021   Pneumococcal Conjugate-13 12/13/2013   Pneumococcal-Unspecified 08/20/2008   Td 05/29/2002   Tdap 10/28/2011, 02/15/2017   Zoster Recombinat (Shingrix) 09/16/2016, 10/13/2016, 12/13/2016, 01/11/2017   Zoster, Live 08/20/2008    Diabetes Related Lab Review: Lab Results  Component Value Date   HGBA1C 7.4 (A) 02/09/2021   HGBA1C 7.9 (A) 11/07/2020   HGBA1C 7.8 (A) 08/04/2020    Lab Results  Component Value Date   MICROALBUR <0.7 03/14/2019   Lab Results  Component Value Date   CREATININE 1.33 04/14/2021   BUN 27 (H) 04/14/2021   NA 140 04/14/2021   K 4.5 04/14/2021   CL 98 04/14/2021   CO2 24 04/14/2021   Lab Results  Component Value Date   CHOL 154 04/14/2021   CHOL 154 08/04/2020   CHOL 147 08/13/2019   Lab Results  Component Value Date   HDL 54.70 04/14/2021   HDL 46.10 08/04/2020   HDL 49.70 08/13/2019   Lab Results  Component Value Date  LDLCALC 75 04/14/2021   LDLCALC 90 08/04/2020   LDLCALC 79 08/13/2019   Lab Results  Component Value Date   TRIG 119.0 04/14/2021   TRIG 86.0 08/04/2020   TRIG 93.0 08/13/2019   Lab Results  Component Value Date   CHOLHDL 3 04/14/2021   CHOLHDL 3 08/04/2020   CHOLHDL 3 08/13/2019   No results found for: LDLDIRECT The ASCVD Risk score (Arnett DK, et al., 2019) failed to calculate for the following reasons:   The 2019 ASCVD risk score is only  valid for ages 77 to 79   The patient has a prior MI or stroke diagnosis I have reviewed the PMH, Fam and Soc history. Patient Active Problem List   Diagnosis Date Noted   Mild cognitive impairment 03/05/2021    Priority: High    MMSE 26/30 and 6CIT 9 03/2021 AWV    Moderate nonproliferative diabetic retinopathy of left eye (HCC) 06/18/2020    Priority: High   Stage 3b chronic kidney disease (HCC) 05/26/2020    Priority: High   Aortic stenosis, moderate 05/22/2019    Priority: High    Stable echocardiogram 05/2020, Dr. Swaziland    Chronic diastolic CHF (congestive heart failure) (HCC) 03/14/2019    Priority: High   Spinal stenosis of lumbar region 02/21/2018    Priority: High   Diabetic peripheral neuropathy associated with type 2 diabetes mellitus (HCC) 05/05/2017    Priority: High    No pain    Atherosclerotic heart disease of native coronary artery without angina pectoris 06/13/2016    Priority: High    Overview:  Overview:  RCA DES placed Nov 2016, LIMA-LAD presumed patent based on Myoview but no visualized, SVG-Dx occluded  Last Assessment & Plan:  RCA DES placed Nov 2016, LIMA-LAD presumed patent based on Myoview but no visualized, SVG-Dx occluded    CVA (cerebral vascular accident) (HCC) 06/06/2016    Priority: High   Recurrent coronary arteriosclerosis after percutaneous transluminal coronary angioplasty 04/18/2015    Priority: High    Overview:  Overview:  RCA DES placed Nov 2016, LIMA-LAD presumed patent based on Myoview but no visualized, SVG-Dx occluded  Last Assessment & Plan:  RCA DES placed Nov 2016, LIMA-LAD presumed patent based on Myoview but no visualized, SVG-Dx occluded    CAD S/P PCI- Nov 2016     Priority: High    RCA DES placed Nov 2016, LIMA-LAD presumed patent based on Myoview but no visualized, SVG-Dx occluded    Current use of long term anticoagulation 02/06/2013    Priority: High    Overview:  Monitored by cardiology    Chronic atrial  fibrillation (HCC) 07/30/2011    Priority: High    CHADs VASc=5 for age, HTN, vascular disease, and DM    Hx of CABG x 2 1990     Priority: High    Status post CABG x2 in 1990 including an LIMA graft to the LAD, and a vein graft to the diagonal.     Essential (primary) hypertension     Priority: High   Diabetes mellitus with diabetic nephropathy, with long-term current use of insulin (HCC)     Priority: High    Neg urine microalbuminuria, not on ace.  On farxiga    Mixed hyperlipidemia     Priority: High   Thoracic aortic aneurysm without rupture 05/26/2020    Priority: Medium     Followed by cards. Stable by CT 2022    Degeneration of lumbar intervertebral disc 02/21/2018  Priority: Medium    Chronic vertigo 03/15/2014    Priority: Medium    Obesity (BMI 30.0-34.9) 01/31/2012    Priority: Medium    Enlarged prostate without lower urinary tract symptoms (luts) 01/04/2011    Priority: Medium     Overview:  Urology - avodart and tamuloscin    Gastro-esophageal reflux disease without esophagitis 12/07/2010    Priority: Medium    Epistaxis, recurrent 04/11/2015    Priority: Low   Osteoarthritis, knee 01/31/2012    Priority: Low   Allergic rhinitis 10/28/2011    Priority: Low   Hearing loss 06/08/2011    Priority: Low   Primary osteoarthritis of hand 06/08/2011    Priority: Low   Benign hematuria 08/27/2008    Priority: Low    Essential Hematuria - urology - Isabel Caprice w/u benign     Stable hemispheric central retinal vein occlusion (CRVO) of right eye 07/21/2020   Lower extremity edema 03/14/2019    Social History: Patient  reports that he quit smoking about 63 years ago. His smoking use included cigarettes. He has a 30.00 pack-year smoking history. He has never used smokeless tobacco. He reports that he does not drink alcohol and does not use drugs.  Review of Systems: Ophthalmic: negative for eye pain, loss of vision or double vision Cardiovascular: negative for  chest pain Respiratory: negative for SOB or persistent cough Gastrointestinal: negative for abdominal pain Genitourinary: negative for dysuria or gross hematuria MSK: negative for foot lesions Neurologic: negative for weakness or gait disturbance  Objective  Vitals: BP (!) 167/80   Pulse 77   Temp 97.8 F (36.6 C) (Temporal)   Ht 5\' 5"  (1.651 m)   Wt 206 lb (93.4 kg)   SpO2 94%   BMI 34.28 kg/m  General: well appearing, no acute distress  Psych:  Alert and oriented, normal mood and affect HEENT:  Normocephalic, atraumatic, moist mucous membranes, supple neck  Cardiovascular:  irreg irreg + murmur Respiratory:  Good breath sounds bilaterally, CTAB with normal effort, no rales No edema    Diabetic education: ongoing education regarding chronic disease management for diabetes was given today. We continue to reinforce the ABC's of diabetic management: A1c (<7 or 8 dependent upon patient), tight blood pressure control, and cholesterol management with goal LDL < 100 minimally. We discuss diet strategies, exercise recommendations, medication options and possible side effects. At each visit, we review recommended immunizations and preventive care recommendations for diabetics and stress that good diabetic control can prevent other problems. See below for this patient's data.   Commons side effects, risks, benefits, and alternatives for medications and treatment plan prescribed today were discussed, and the patient expressed understanding of the given instructions. Patient is instructed to call or message via MyChart if he/she has any questions or concerns regarding our treatment plan. No barriers to understanding were identified. We discussed Red Flag symptoms and signs in detail. Patient expressed understanding regarding what to do in case of urgent or emergency type symptoms.  Medication list was reconciled, printed and provided to the patient in AVS. Patient instructions and summary  information was reviewed with the patient as documented in the AVS. This note was prepared with assistance of Dragon voice recognition software. Occasional wrong-word or sound-a-like substitutions may have occurred due to the inherent limitations of voice recognition software  This visit occurred during the SARS-CoV-2 public health emergency.  Safety protocols were in place, including screening questions prior to the visit, additional usage of staff PPE, and extensive  cleaning of exam room while observing appropriate contact time as indicated for disinfecting solutions.

## 2021-05-11 NOTE — Patient Instructions (Signed)
Please return in 6 months for recheck.  ? ?Everything looks good! ?Happy 89st birthday! Keep breathing! :) ? ?If you have any questions or concerns, please don't hesitate to send me a message via MyChart or call the office at [phone]. Thank you for visiting with [place] today! It's our pleasure caring for you.  ?

## 2021-05-13 ENCOUNTER — Other Ambulatory Visit: Payer: Self-pay

## 2021-05-13 ENCOUNTER — Ambulatory Visit (INDEPENDENT_AMBULATORY_CARE_PROVIDER_SITE_OTHER): Payer: Medicare Other | Admitting: Ophthalmology

## 2021-05-13 ENCOUNTER — Encounter (INDEPENDENT_AMBULATORY_CARE_PROVIDER_SITE_OTHER): Payer: Self-pay | Admitting: Ophthalmology

## 2021-05-13 DIAGNOSIS — Z9861 Coronary angioplasty status: Secondary | ICD-10-CM | POA: Diagnosis not present

## 2021-05-13 DIAGNOSIS — E113392 Type 2 diabetes mellitus with moderate nonproliferative diabetic retinopathy without macular edema, left eye: Secondary | ICD-10-CM | POA: Diagnosis not present

## 2021-05-13 DIAGNOSIS — H348112 Central retinal vein occlusion, right eye, stable: Secondary | ICD-10-CM | POA: Diagnosis not present

## 2021-05-13 DIAGNOSIS — I2510 Atherosclerotic heart disease of native coronary artery without angina pectoris: Secondary | ICD-10-CM | POA: Diagnosis not present

## 2021-05-13 NOTE — Assessment & Plan Note (Signed)
No signs of recurrence of CME, collateralized vessels on the nerve stabilize ?

## 2021-05-13 NOTE — Progress Notes (Signed)
05/13/2021     CHIEF COMPLAINT Patient presents for  Chief Complaint  Patient presents with   Retina Follow Up      HISTORY OF PRESENT ILLNESS: [name] is a 88 y.o. male who presents to the clinic today for:   HPI     Retina Follow Up           Diagnosis: CRVO/BRVO   Laterality: both eyes   Onset: 9 months ago   Course: stable         Comments   9 mos fu OU oct FP. Patient states vision is stable and unchanged since last visit. Denies any new floaters or FOL. Pt states he fell and cracked his rib about 5-6 weeks ago.       Last edited by Nelva Nay on 05/13/2021  1:13 PM.      Referring physician: Sallye Lat, MD [address] [place],  [place] [address]  HISTORICAL INFORMATION:   Selected notes from the MEDICAL RECORD NUMBER    Lab Results  Component Value Date   HGBA1C 7.6 (A) 05/11/2021     CURRENT MEDICATIONS: No current outpatient medications on file. (Ophthalmic Drugs)   No current facility-administered medications for this visit. (Ophthalmic Drugs)   Current Outpatient Medications (Other)  Medication Sig   apixaban (ELIQUIS) 5 MG TABS tablet Take 1 tablet (5 mg total) by mouth 2 (two) times daily.   atorvastatin (LIPITOR) 40 MG tablet Take 1 tablet (40 mg total) by mouth daily at 6 PM.   AVODART 0.5 MG capsule Take 0.5 mg by mouth every Monday, Wednesday, and Friday.    Cholecalciferol 50 MCG (2000 UT) TABS Take 1 tablet by mouth daily.   diltiazem (TIAZAC) 360 MG 24 hr capsule Take 1 capsule by mouth daily.   empagliflozin (JARDIANCE) 25 MG TABS tablet Take 1 tablet (25 mg total) by mouth daily before breakfast.   fluticasone (FLONASE) 50 MCG/ACT nasal spray SHAKE LIQUID AND USE 1 SPRAY IN EACH NOSTRIL DAILY   furosemide (LASIX) 40 MG tablet TAKE 1 TABLET(40 MG) BY MOUTH DAILY   gabapentin (NEURONTIN) 300 MG capsule TAKE 1 CAPSULE(300 MG) BY MOUTH THREE TIMES DAILY   insulin glargine (LANTUS) 100 UNIT/ML  injection Inject 40 Units into the skin every morning.   metFORMIN (GLUCOPHAGE) 500 MG tablet Take 500 mg by mouth 2 (two) times daily with a meal.   metoprolol succinate (TOPROL-XL) 100 MG 24 hr tablet Take 1 tablet (100 mg total) by mouth daily. Take with or immediately following a meal.   nitroGLYCERIN (NITROSTAT) 0.4 MG SL tablet Place 0.4 mg under the tongue every 5 (five) minutes as needed for chest pain (x 3 doses).   pantoprazole (PROTONIX) 40 MG tablet TAKE 1 TABLET BY MOUTH DAILY AT NOON   potassium chloride SA (KLOR-CON) 20 MEQ tablet TAKE 1 TABLET(20 MEQ) BY MOUTH DAILY   PRECISION XTRA TEST STRIPS test strip USE TO TEST BLOOD SUGAR 1 TO 2 TIMES DAILY   tamsulosin (FLOMAX) 0.4 MG CAPS capsule Take 0.4 mg by mouth every other day.   No current facility-administered medications for this visit. (Other)      REVIEW OF SYSTEMS: ROS   Negative for: Constitutional, Gastrointestinal, Neurological, Skin, Genitourinary, Musculoskeletal, HENT, Endocrine, Cardiovascular, Eyes, Respiratory, Psychiatric, Allergic/Imm, Heme/Lymph Last edited by Edmon Crape, MD on 05/13/2021  1:50 PM.       ALLERGIES Allergies  Allergen Reactions   Erythromycin Base    Septra [  Sulfamethoxazole-Trimethoprim] Itching   Cefadroxil Other (See Comments)    unknown   Ciprofloxacin Other (See Comments)    Unknown Unknown   Erythromycin Other (See Comments)    Upset stomach Upset stomach    PAST MEDICAL HISTORY Past Medical History:  Diagnosis Date   Abdominal pain    Acute renal failure (HCC)     resolved   Arthritis    "hands & legs" (11/'10/2014)   Ataxia    Atrial fibrillation (HCC)    Bladder outlet obstruction    Bladder outlet obstruction    BPH (benign prostatic hyperplasia)    CAD (coronary artery disease)    a. CABG IN 1989. b. 01/08/2015 CTO of ost LAD, LIMA to LAD not visualized but assumed patent given myoview finding, occluded SVG to diagonal, 99% mid RCA tx w/ SYNERGY DES 3X28 mm    Constipation    Diabetes mellitus type 2, insulin dependent (HCC)    Epistaxis, recurrent Feb 2017   GERD (gastroesophageal reflux disease)    HTN (hypertension)    Hx of bacterial pneumonia    Hyperlipemia    Kidney stones    Mild aortic stenosis    Mild cognitive impairment 03/05/2021   MMSE 26/30 and 6CIT 9 03/2021 AWV   Pneumonia 04/2019   Rib fractures    left rib fractures being treated with pain medications   Stented coronary artery Nov 2016   RCA DES   Urinary tract infection     Enterococcus   Past Surgical History:  Procedure Laterality Date   CARDIAC CATHETERIZATION  1989   CARDIAC CATHETERIZATION N/A 01/08/2015   Procedure: Left Heart Cath and Cors/Grafts Angiography;  Surgeon: Peter M Swaziland, MD;  Location[facility] INVASIVE CV LAB;  Service: Cardiovascular;  Laterality: N/A;   CARDIAC CATHETERIZATION  01/08/2015   Procedure: Coronary Stent Intervention;  Surgeon: Peter M Swaziland, MD;  Location: [facility] INVASIVE CV LAB;  Service: Cardiovascular;;   CARPAL TUNNEL RELEASE Right 08/2010   CATARACT EXTRACTION W/ INTRAOCULAR LENS  IMPLANT, BILATERAL Bilateral    CORONARY ANGIOPLASTY  01/08/15   RCA DES   CORONARY ARTERY BYPASS GRAFT  1989   "CABG X 2"   IR THORACENTESIS ASP PLEURAL SPACE W/IMG GUIDE  05/14/2019   JOINT REPLACEMENT     TONSILLECTOMY     TOTAL HIP ARTHROPLASTY Right 2000    FAMILY HISTORY Family History  Problem Relation Age of Onset   Brain cancer Father    Throat cancer Brother     SOCIAL HISTORY Social History   Tobacco Use   Smoking status: Former    Packs/day: 3.00    Years: 10.00    Pack years: 30.00    Types: Cigarettes    Quit date: 05/11/1958    Years since quitting: 63.0   Smokeless tobacco: Never  Vaping Use   Vaping Use: Never used  Substance Use Topics   Alcohol use: No   Drug use: No         OPHTHALMIC EXAM:  Base Eye Exam     Visual Acuity (ETDRS)       Right Left   Dist cc 20/20 -2 20/20 -2    Correction: Glasses          Tonometry (Tonopen, 1:16 PM)       Right Left   Pressure 14 18         Pupils       Pupils Dark Light APD   Right PERRL 4 3 None  Left PERRL 4 3 None         Visual Fields (Counting fingers)       Left Right    Full Full         Extraocular Movement       Right Left    Full Full         Neuro/Psych     Oriented x3: Yes   Mood/Affect: Normal         Dilation     Both eyes: 1.0% Mydriacyl, 2.5% Phenylephrine @ 1:16 PM           Slit Lamp and Fundus Exam     External Exam       Right Left   External Normal Normal         Slit Lamp Exam       Right Left   Lids/Lashes Normal Normal   Conjunctiva/Sclera White and quiet White and quiet   Cornea Clear Clear   Anterior Chamber Deep and quiet Deep and quiet   Iris Round and reactive Round and reactive   Lens Posterior chamber intraocular lens Posterior chamber intraocular lens   Anterior Vitreous Normal Normal         Fundus Exam       Right Left   Posterior Vitreous Posterior vitreous detachment Posterior vitreous detachment   Disc Normal Normal   C/D Ratio 0.65 0.6   Macula Normal Normal   Vessels Old retinal vein occlusion, compensated, microaneurysms temporally, not diffusely present NPDR- Moderate   Periphery Normal Normal            IMAGING AND PROCEDURES  Imaging and Procedures for 05/13/21  OCT, Retina - OU - Both Eyes       Right Eye Quality was good. Scan locations included subfoveal. Central Foveal Thickness: 294. Progression has been stable. Findings include abnormal foveal contour.   Left Eye Quality was good. Scan locations included subfoveal. Central Foveal Thickness: 280. Progression has been stable. Findings include normal foveal contour.   Notes Sectorial atrophy superior from old Hemi central retinal vein occlusion now compensated OD, will continue to observe OS normal     Color Fundus Photography Optos - OU - Both Eyes       Right  Eye Progression has no prior data. Disc findings include increased cup to disc ratio. Vessels : normal observations. Periphery : normal observations.   Left Eye Progression has no prior data. Disc findings include normal observations. Macula : normal observations. Vessels : normal observations. Periphery : normal observations.   Notes OD, stable cup-to-disc ratio with all notching superior in the region of old BRVO which is now collateralized and stable OD.  Incidental PVD noted.  OS stable             ASSESSMENT/PLAN:  Stable hemispheric central retinal vein occlusion (CRVO) of right eye No signs of recurrence of CME, collateralized vessels on the nerve stabilize  Moderate nonproliferative diabetic retinopathy of left eye (HCC) No progression stable     ICD-10-CM   1. Stable hemispheric central retinal vein occlusion (CRVO) of right eye  H34.8112 OCT, Retina - OU - Both Eyes    Color Fundus Photography Optos - OU - Both Eyes    2. Moderate nonproliferative diabetic retinopathy of left eye without macular edema associated with type 2 diabetes mellitus (HCC)  N02.7253       1.  OU, stable examination year-over-year.  No progression of disease in the right eye  were stabilized old BRVO is noted.  2.  Moderate NPDR is noted OS, no progression observed  3.  Ophthalmic Meds Ordered this visit:  No orders of the defined types were placed in this encounter.      Return in about 1 year (around 05/14/2022) for DILATE OU, COLOR FP, OCT.  There are no Patient Instructions on file for this visit.   Explained the diagnoses, plan, and follow up with the patient and they expressed understanding.  Patient expressed understanding of the importance of proper follow up care.   Alford Highland Satcha Storlie M.D. Diseases & Surgery of the Retina and Vitreous [facility] 05/13/21     Abbreviations: M myopia (nearsighted); A astigmatism; H hyperopia (farsighted); P presbyopia;  Mrx spectacle prescription;  CTL contact lenses; OD right eye; OS left eye; OU both eyes  XT exotropia; ET esotropia; PEK punctate epithelial keratitis; PEE punctate epithelial erosions; DES dry eye syndrome; MGD meibomian gland dysfunction; ATs artificial tears; PFAT's preservative free artificial tears; NSC nuclear sclerotic cataract; PSC posterior subcapsular cataract; ERM epi-retinal membrane; PVD posterior vitreous detachment; RD retinal detachment; DM diabetes mellitus; DR diabetic retinopathy; NPDR non-proliferative diabetic retinopathy; PDR proliferative diabetic retinopathy; CSME clinically significant macular edema; DME diabetic macular edema; dbh dot blot hemorrhages; CWS cotton wool spot; POAG primary open angle glaucoma; C/D cup-to-disc ratio; HVF humphrey visual field; GVF goldmann visual field; OCT optical coherence tomography; IOP intraocular pressure; BRVO Branch retinal vein occlusion; CRVO central retinal vein occlusion; CRAO central retinal artery occlusion; BRAO branch retinal artery occlusion; RT retinal tear; SB scleral buckle; PPV pars plana vitrectomy; VH Vitreous hemorrhage; PRP panretinal laser photocoagulation; IVK intravitreal kenalog; VMT vitreomacular traction; MH Macular hole;  NVD neovascularization of the disc; NVE neovascularization elsewhere; AREDS age related eye disease study; ARMD age related macular degeneration; POAG primary open angle glaucoma; EBMD epithelial/anterior basement membrane dystrophy; ACIOL anterior chamber intraocular lens; IOL intraocular lens; PCIOL posterior chamber intraocular lens; Phaco/IOL phacoemulsification with intraocular lens placement; PRK photorefractive keratectomy; LASIK laser assisted in situ keratomileusis; HTN hypertension; DM diabetes mellitus; COPD chronic obstructive pulmonary disease

## 2021-05-13 NOTE — Assessment & Plan Note (Signed)
No progression stable ?

## 2021-05-15 ENCOUNTER — Other Ambulatory Visit: Payer: Self-pay | Admitting: Family Medicine

## 2021-05-25 ENCOUNTER — Other Ambulatory Visit: Payer: Self-pay | Admitting: Cardiology

## 2021-06-10 ENCOUNTER — Ambulatory Visit (INDEPENDENT_AMBULATORY_CARE_PROVIDER_SITE_OTHER): Payer: Medicare Other | Admitting: Podiatry

## 2021-06-10 ENCOUNTER — Encounter: Payer: Self-pay | Admitting: Podiatry

## 2021-06-10 DIAGNOSIS — E1142 Type 2 diabetes mellitus with diabetic polyneuropathy: Secondary | ICD-10-CM

## 2021-06-10 DIAGNOSIS — B351 Tinea unguium: Secondary | ICD-10-CM

## 2021-06-10 DIAGNOSIS — D689 Coagulation defect, unspecified: Secondary | ICD-10-CM | POA: Diagnosis not present

## 2021-06-10 DIAGNOSIS — M79674 Pain in right toe(s): Secondary | ICD-10-CM

## 2021-06-10 NOTE — Progress Notes (Signed)
This patient returns to my office for at risk foot care.  This patient requires this care by a professional since this patient will be at risk due to having diabetic neuropathy and coagulation defect.  Patient is taking eliquiss.  Patient says toenails are painful walking and wearing his shoes.  This patient presents for at risk foot care today. ? ?General Appearance  Alert, conversant and in no acute stress. ? ?Vascular  Dorsalis pedis and posterior tibial  pulses are weakly  palpable  bilaterally.  Capillary return is within normal limits  bilaterally. Temperature is within normal limits  bilaterally. ? ?Neurologic  Senn-Weinstein monofilament wire test within normal limits  bilaterally. Muscle power within normal limits bilaterally. ? ?Nails Thick disfigured discolored nails with subungual debris  from hallux to fifth toes bilaterally. No evidence of bacterial infection or drainage bilaterally. ? ?Orthopedic  No limitations of motion  feet .  No crepitus or effusions noted.  No bony pathology or digital deformities noted. ? ?Skin  normotropic skin with no porokeratosis noted bilaterally.  No signs of infections or ulcers noted.    ? ?Onychomycosis  B/L ? ?Consent was obtained for treatment procedures.   Debridement of nails with nail nipper followed by dremel tool. Filed with dremel without incident.  ? ? ?Return office visit    10   weeks                Told patient to return for periodic foot care and evaluation due to potential at risk complications. ? ? ?Gardiner Barefoot DPM  ?

## 2021-06-12 ENCOUNTER — Telehealth: Payer: Self-pay | Admitting: Pharmacist

## 2021-06-12 NOTE — Progress Notes (Signed)
? ? ?Chronic Care Management ?Pharmacy Assistant  ? ?Name: [name]  MRN: [id number] DOB: Jan 19, 1929 ? ?Reason for Encounter: General Adherence Call ?  ? ?Recent office visits:  ?05/11/2021 OV (PCP) [name], MD; no medication changes indicated. ? ?04/14/2021 OV (PCP) [name], MD;  Will start Percocet 10/325 to be used every 4-6 hours as needed for pain.  Discussed stopping the hydrocodone. Narcotic induced constipation: Start MiraLAX and Colace twice daily. ? ?02/09/2021 OV (PCP) [name], MD;  ? ?Recent consult visits:  ?06/10/2021 OV (Podiatry) Gardiner Barefoot, DPM; no medication changes indicated. ? ?05/13/2021 OV (Retina Spec) Hurman Horn, MD; no medication changes indicated. ? ?02/24/20223 OV (Cardiology) Martinique, Peter M, MD; Recent mechanical fall with rib fracture. Will give Rx for hydrocodone A# 10 tablets until he can get back with Dr Jonni Sanger ? ?03/09/2021 OV (Podiatry) Gardiner Barefoot, DPM; no medication changes indicated. ? ?Hospital visits:  ?04/12/2021 ED visit for Closed fracture of rib ?-Rx Hydrocodone-acetaminophen 5-325 mg ? ?Medications: ?Outpatient Encounter Medications as of 06/12/2021  ?Medication Sig Note  ? apixaban (ELIQUIS) 5 MG TABS tablet Take 1 tablet (5 mg total) by mouth 2 (two) times daily.   ? atorvastatin (LIPITOR) 40 MG tablet Take 1 tablet (40 mg total) by mouth daily at 6 PM.   ? AVODART 0.5 MG capsule Take 0.5 mg by mouth every Monday, Wednesday, and Friday.    ? Cholecalciferol 50 MCG (2000 UT) TABS Take 1 tablet by mouth daily.   ? diltiazem (TIAZAC) 360 MG 24 hr capsule Take 1 capsule by mouth daily.   ? empagliflozin (JARDIANCE) 25 MG TABS tablet Take 1 tablet (25 mg total) by mouth daily before breakfast.   ? fluticasone (FLONASE) 50 MCG/ACT nasal spray SHAKE LIQUID AND USE 1 SPRAY IN EACH NOSTRIL DAILY   ? furosemide (LASIX) 40 MG tablet TAKE 1 TABLET(40 MG) BY MOUTH DAILY   ? gabapentin (NEURONTIN) 300 MG capsule TAKE 1 CAPSULE(300 MG) BY MOUTH  THREE TIMES DAILY   ? insulin glargine (LANTUS) 100 UNIT/ML injection Inject 40 Units into the skin every morning.   ? metFORMIN (GLUCOPHAGE) 500 MG tablet Take 500 mg by mouth 2 (two) times daily with a meal.   ? metoprolol succinate (TOPROL-XL) 100 MG 24 hr tablet Take 1 tablet (100 mg total) by mouth daily. Take with or immediately following a meal.   ? nitroGLYCERIN (NITROSTAT) 0.4 MG SL tablet Place 0.4 mg under the tongue every 5 (five) minutes as needed for chest pain (x 3 doses). 02/15/2020: As needed  ? pantoprazole (PROTONIX) 40 MG tablet TAKE 1 TABLET BY MOUTH DAILY AT NOON   ? potassium chloride SA (KLOR-CON) 20 MEQ tablet TAKE 1 TABLET(20 MEQ) BY MOUTH DAILY   ? PRECISION XTRA TEST STRIPS test strip USE TO TEST BLOOD SUGAR 1 TO 2 TIMES DAILY   ? tamsulosin (FLOMAX) 0.4 MG CAPS capsule Take 0.4 mg by mouth every other day.   ? ?No facility-administered encounter medications on file as of 06/12/2021.  ? ?Patient Questions: ?Have you had any problems recently with your health? ?Patient denies having any issues or problems recently with his health. ? ?Have you had any problems with your pharmacy? ?Patient denies having any problems with his pharmacy. ? ?What issues or side effects are you having with your medications? ?Patient denies having any issues or side effects with any of his medications. ? ?What would you like me to pass along to Leata Mouse, CPP for  him to help you with?  ?Patient does not have anything for me to pass along at this time. ? ?What can we do to take care of you better? ?Patient did not have any suggestions. ? ?Care Gaps: ?Medicare Annual Wellness: Completed 03/05/2021 ?Ophthalmology Exam: Next due on 07/21/2021 ?Foot Exam: Next due on 08/22/2021 ?Hemoglobin A1C: 7.6% on 05/11/2021 ?Colonoscopy: Aged out ? ?Future Appointments  ?Date Time Provider [place]  ?08/19/2021  3:15 PM Gardiner Barefoot, DPM [facility]  ?10/22/2021 10:00 AM Martinique, Peter M, MD [facility]   ?11/11/2021 10:00 AM [name], MD [facility]  ?03/11/2022  8:45 AM [facility] HEALTH COACH [facility]  ?05/17/2022  1:00 PM Rankin, Clent Demark, MD [facility] None  ? ?Star Rating Drugs: ?None ? ?April D Calhoun, [place] ?Clinical Pharmacist Assistant ?[phone] ?

## 2021-07-08 ENCOUNTER — Ambulatory Visit (INDEPENDENT_AMBULATORY_CARE_PROVIDER_SITE_OTHER): Payer: Medicare Other | Admitting: Family Medicine

## 2021-07-08 ENCOUNTER — Encounter: Payer: Self-pay | Admitting: Family Medicine

## 2021-07-08 VITALS — BP 136/72 | HR 88 | Temp 98.0°F | Ht 65.0 in | Wt 208.0 lb

## 2021-07-08 DIAGNOSIS — M25551 Pain in right hip: Secondary | ICD-10-CM | POA: Diagnosis not present

## 2021-07-08 DIAGNOSIS — M48062 Spinal stenosis, lumbar region with neurogenic claudication: Secondary | ICD-10-CM

## 2021-07-08 MED ORDER — HYDROCODONE-ACETAMINOPHEN 10-325 MG PO TABS
1.0000 | ORAL_TABLET | Freq: Three times a day (TID) | ORAL | 0 refills | Status: AC | PRN
Start: 2021-07-08 — End: 2021-07-13

## 2021-07-08 NOTE — Progress Notes (Signed)
? ?Subjective  ?CC:  ?Chief Complaint  ?Patient presents with  ? Back Pain  ?  Pt stated that he fell back in February and hurt his left hand broke rib. He is in a lot of pain and can not bend over  ? Hip Pain  ? ? ?HPI: [name] is a 88 y.o. male who presents to the office today to address the problems listed above in the chief complaint. ?88 year old with history of lumbar stenosis and recurrent right hip pain.  Has history of right total hip revision in the past.  Over the last several years has had different resurfacing problems.  Was doing very well but over the last few weeks right hip pain radiating down leg has resumed.  Pain is moderate to severe.  He had 2 leftover oxycodone from his treatment for rib fractures and took them in the last 2 days which helped considerably.  Using a cane again to ambulate.  No bowel or bladder problems.  In the past, he saw Dr. Erlinda Hong which helped relieve his pain with an injection.  I reviewed his last record from the orthopedic office in June of last year.  He received an injection for greater trochanteric bursitis.  There was question of whether there was a hip problem as well at that time. ? ?Assessment  ?1. Right hip pain   ?2. Spinal stenosis of lumbar region with neurogenic claudication   ? ?  ?Plan  ?Right hip pain and history of spinal stenosis: Pain could be radicular or musculoskeletal or bursitis.  He will make an appointment with his orthopedic surgeon for further evaluation.  Hydrocodone to be used in the interim. ? ?Follow up: As scheduled ?11/11/2021 ? ?No orders of the defined types were placed in this encounter. ? ?Meds ordered this encounter  ?Medications  ? HYDROcodone-acetaminophen (NORCO) 10-325 MG tablet  ?  Sig: Take 1 tablet by mouth every 8 (eight) hours as needed for up to 5 days.  ?  Dispense:  20 tablet  ?  Refill:  0  ? ?  ? ?I reviewed the patients updated PMH, FH, and SocHx.  ?  ?Patient Active Problem List  ? Diagnosis Date Noted  ? Mild  cognitive impairment 03/05/2021  ?  Priority: High  ? Moderate nonproliferative diabetic retinopathy of left eye ([place]) 06/18/2020  ?  Priority: High  ? Stage 3b chronic kidney disease ([place]) 05/26/2020  ?  Priority: High  ? Aortic stenosis, moderate 05/22/2019  ?  Priority: High  ? Chronic diastolic CHF (congestive heart failure) ([place]) 03/14/2019  ?  Priority: High  ? Spinal stenosis of lumbar region 02/21/2018  ?  Priority: High  ? Diabetic peripheral neuropathy associated with type 2 diabetes mellitus ([place]) 05/05/2017  ?  Priority: High  ? Atherosclerotic heart disease of native coronary artery without angina pectoris 06/13/2016  ?  Priority: High  ? CVA (cerebral vascular accident) ([place]) 06/06/2016  ?  Priority: High  ? Recurrent coronary arteriosclerosis after percutaneous transluminal coronary angioplasty 04/18/2015  ?  Priority: High  ? CAD S/P PCI- Nov 2016   ?  Priority: High  ? Current use of long term anticoagulation 02/06/2013  ?  Priority: High  ? Chronic atrial fibrillation ([place]) 07/30/2011  ?  Priority: High  ? Hx of CABG x 2 1990   ?  Priority: High  ? Essential (primary) hypertension   ?  Priority: High  ? Diabetes mellitus with diabetic nephropathy, with long-term current use  of insulin ([place])   ?  Priority: High  ? Mixed hyperlipidemia   ?  Priority: High  ? Thoracic aortic aneurysm without rupture ([place]) 05/26/2020  ?  Priority: Medium   ? Degeneration of lumbar intervertebral disc 02/21/2018  ?  Priority: Medium   ? Chronic vertigo 03/15/2014  ?  Priority: Medium   ? Obesity (BMI 30.0-34.9) 01/31/2012  ?  Priority: Medium   ? Enlarged prostate without lower urinary tract symptoms (luts) 01/04/2011  ?  Priority: Medium   ? Gastro-esophageal reflux disease without esophagitis 12/07/2010  ?  Priority: Medium   ? Epistaxis, recurrent 04/11/2015  ?  Priority: Low  ? Osteoarthritis, knee 01/31/2012  ?  Priority: Low  ? Allergic rhinitis 10/28/2011  ?  Priority: Low  ? Hearing loss 06/08/2011  ?  Priority:  Low  ? Primary osteoarthritis of hand 06/08/2011  ?  Priority: Low  ? Benign hematuria 08/27/2008  ?  Priority: Low  ? Stable hemispheric central retinal vein occlusion (CRVO) of right eye 07/21/2020  ? Lower extremity edema 03/14/2019  ? ?Current Meds  ?Medication Sig  ? apixaban (ELIQUIS) 5 MG TABS tablet Take 1 tablet (5 mg total) by mouth 2 (two) times daily.  ? atorvastatin (LIPITOR) 40 MG tablet Take 1 tablet (40 mg total) by mouth daily at 6 PM.  ? AVODART 0.5 MG capsule Take 0.5 mg by mouth every Monday, Wednesday, and Friday.   ? Cholecalciferol 50 MCG (2000 UT) TABS Take 1 tablet by mouth daily.  ? diltiazem (TIAZAC) 360 MG 24 hr capsule Take 1 capsule by mouth daily.  ? empagliflozin (JARDIANCE) 25 MG TABS tablet Take 1 tablet (25 mg total) by mouth daily before breakfast.  ? fluticasone (FLONASE) 50 MCG/ACT nasal spray SHAKE LIQUID AND USE 1 SPRAY IN EACH NOSTRIL DAILY  ? furosemide (LASIX) 40 MG tablet TAKE 1 TABLET(40 MG) BY MOUTH DAILY  ? gabapentin (NEURONTIN) 300 MG capsule TAKE 1 CAPSULE(300 MG) BY MOUTH THREE TIMES DAILY  ? HYDROcodone-acetaminophen (NORCO) 10-325 MG tablet Take 1 tablet by mouth every 8 (eight) hours as needed for up to 5 days.  ? insulin glargine (LANTUS) 100 UNIT/ML injection Inject 40 Units into the skin every morning.  ? metFORMIN (GLUCOPHAGE) 500 MG tablet Take 500 mg by mouth 2 (two) times daily with a meal.  ? metoprolol succinate (TOPROL-XL) 100 MG 24 hr tablet Take 1 tablet (100 mg total) by mouth daily. Take with or immediately following a meal.  ? nitroGLYCERIN (NITROSTAT) 0.4 MG SL tablet Place 0.4 mg under the tongue every 5 (five) minutes as needed for chest pain (x 3 doses).  ? pantoprazole (PROTONIX) 40 MG tablet TAKE 1 TABLET BY MOUTH DAILY AT NOON  ? potassium chloride SA (KLOR-CON) 20 MEQ tablet TAKE 1 TABLET(20 MEQ) BY MOUTH DAILY  ? PRECISION XTRA TEST STRIPS test strip USE TO TEST BLOOD SUGAR 1 TO 2 TIMES DAILY  ? tamsulosin (FLOMAX) 0.4 MG CAPS capsule Take  0.4 mg by mouth every other day.  ? ? ?Allergies: ?Patient is allergic to erythromycin base, septra [sulfamethoxazole-trimethoprim], cefadroxil, ciprofloxacin, and erythromycin. ?Family History: ?Patient family history includes Brain cancer in his father; Throat cancer in his brother. ?Social History:  ?Patient  reports that he quit smoking about 63 years ago. His smoking use included cigarettes. He has a 30.00 pack-year smoking history. He has never used smokeless tobacco. He reports that he does not drink alcohol and does not use drugs. ? ?Review of Systems: ?  Constitutional: Negative for fever malaise or anorexia ?Cardiovascular: negative for chest pain ?Respiratory: negative for SOB or persistent cough ?Gastrointestinal: negative for abdominal pain ? ?Objective  ?Vitals: BP 136/72   Pulse 88   Temp 98 ?F (36.7 ?C)   Ht '5\' 5"'$  (1.651 m)   Wt 208 lb (94.3 kg)   SpO2 97%   BMI 34.61 kg/m?  ?General: no acute distress while sitting, A&Ox3 ?Antalgic gait with cane.  Negative straight leg raise. ? ? ? ?Commons side effects, risks, benefits, and alternatives for medications and treatment plan prescribed today were discussed, and the patient expressed understanding of the given instructions. Patient is instructed to call or message via MyChart if he/she has any questions or concerns regarding our treatment plan. No barriers to understanding were identified. We discussed Red Flag symptoms and signs in detail. Patient expressed understanding regarding what to do in case of urgent or emergency type symptoms.  ?Medication list was reconciled, printed and provided to the patient in AVS. Patient instructions and summary information was reviewed with the patient as documented in the AVS. ?This note was prepared with assistance of Systems analyst. Occasional wrong-word or sound-a-like substitutions may have occurred due to the inherent limitations of voice recognition software ? ?This visit occurred during  the SARS-CoV-2 public health emergency.  Safety protocols were in place, including screening questions prior to the visit, additional usage of staff PPE, and extensive cleaning of exam room while observing

## 2021-07-08 NOTE — Patient Instructions (Addendum)
Please follow up as scheduled for your next visit with me: 11/11/2021  ? ?If you have any questions or concerns, please don't hesitate to send me a message via MyChart or call the office at [phone]. Thank you for visiting with [place] today! It's our pleasure caring for you.  ? ?Call Dr. Phoebe Sharps office to make an appointment. Let me know if you have any problems.  ?[phone]  ? ?

## 2021-07-15 ENCOUNTER — Ambulatory Visit (INDEPENDENT_AMBULATORY_CARE_PROVIDER_SITE_OTHER): Payer: Medicare Other | Admitting: Orthopaedic Surgery

## 2021-07-15 ENCOUNTER — Encounter: Payer: Self-pay | Admitting: Orthopaedic Surgery

## 2021-07-15 DIAGNOSIS — M7061 Trochanteric bursitis, right hip: Secondary | ICD-10-CM

## 2021-07-15 DIAGNOSIS — Z9861 Coronary angioplasty status: Secondary | ICD-10-CM

## 2021-07-15 DIAGNOSIS — I2510 Atherosclerotic heart disease of native coronary artery without angina pectoris: Secondary | ICD-10-CM | POA: Diagnosis not present

## 2021-07-15 NOTE — Progress Notes (Signed)
Office Visit Note   Patient: [name]           Date of Birth: 1928-11-24           MRN: [id number] Visit Date: 07/15/2021              Requested by: [name], [place],  [place] [address] PCP: [name], MD   Assessment & Plan: Visit Diagnoses:  1. Trochanteric bursitis, right hip     Plan: Impression is recurrent right hip trochanteric bursitis.  Today, we discussed repeating cortisone injection today for which he is agreeable to.  We will follow-up with [place] as needed.  Call with concerns or questions.  Follow-Up Instructions: Return if symptoms worsen or fail to improve.   Orders:  No orders of the defined types were placed in this encounter.  No orders of the defined types were placed in this encounter.     Procedures: Large Joint Inj: R greater trochanter on 07/16/2021 7:53 PM Indications: pain Details: 22 G needle  Arthrogram: No  Medications: 3 mL lidocaine 1 %; 3 mL bupivacaine 0.5 %; 40 mg methylPREDNISolone acetate 40 MG/ML Patient was prepped and draped in the usual sterile fashion.      Clinical Data: No additional findings.   Subjective: Chief Complaint  Patient presents with   Right Hip - Pain    HPI patient is a pleasant 88 year old gentleman who comes in today with recurrent right lateral hip pain.  History of right total hip replacement [place] back in 1995 with evidence of loosening on previous x-rays.  He has been complaining primarily of lateral hip pain, however.  He has been dealing with this for years as well where he has had multiple cortisone injections to the greater troches [place].  These injections appear to help for about 1 years time.  His last injection was in June of last year.  Symptoms returned about a week ago.  Symptoms are all the lateral hip and appear to be worse with damp weather.  He is requesting repeat injection today.  Review of Systems as detailed in HPI.  All others reviewed are  negative.   Objective: Vital Signs: There were no vitals taken for this visit.  Physical Exam well-developed well-nourished gentleman in no acute distress.  Alert and oriented x3.  Ortho Exam right hip exam shows moderate tenderness to the greater trochanter.  Painless logroll.  He is neurovascular intact distally.  Specialty Comments:  No specialty comments available.  Imaging: No new imaging   PMFS History: Patient Active Problem List   Diagnosis Date Noted   Mild cognitive impairment 03/05/2021   Stable hemispheric central retinal vein occlusion (CRVO) of right eye 07/21/2020   Moderate nonproliferative diabetic retinopathy of left eye ([place]) 06/18/2020   Thoracic aortic aneurysm without rupture ([place]) 05/26/2020   Stage 3b chronic kidney disease ([place]) 05/26/2020   Aortic stenosis, moderate 05/22/2019   Chronic diastolic CHF (congestive heart failure) ([place]) 03/14/2019   Lower extremity edema 03/14/2019   Spinal stenosis of lumbar region 02/21/2018   Degeneration of lumbar intervertebral disc 02/21/2018   Diabetic peripheral neuropathy associated with type 2 diabetes mellitus ([place]) 05/05/2017   Atherosclerotic heart disease of native coronary artery without angina pectoris 06/13/2016   CVA (cerebral vascular accident) ([place]) 06/06/2016   Recurrent coronary arteriosclerosis after percutaneous transluminal coronary angioplasty 04/18/2015   Epistaxis, recurrent 04/11/2015   CAD S/P PCI- Nov 2016    Chronic vertigo  03/15/2014   Current use of long term anticoagulation 02/06/2013   Obesity (BMI 30.0-34.9) 01/31/2012   Osteoarthritis, knee 01/31/2012   Allergic rhinitis 10/28/2011   Chronic atrial fibrillation ([place]) 07/30/2011   Hearing loss 06/08/2011   Primary osteoarthritis of hand 06/08/2011   Enlarged prostate without lower urinary tract symptoms (luts) 01/04/2011   Gastro-esophageal reflux disease without esophagitis 12/07/2010   Hx of CABG x 2 1990    Essential  (primary) hypertension    Diabetes mellitus with diabetic nephropathy, with long-term current use of insulin ([place])    Mixed hyperlipidemia    Benign hematuria 08/27/2008   Past Medical History:  Diagnosis Date   Abdominal pain    Acute renal failure ([place])     resolved   Arthritis    "hands & legs" (11/'10/2014)   Ataxia    Atrial fibrillation (HCC)    Bladder outlet obstruction    Bladder outlet obstruction    BPH (benign prostatic hyperplasia)    CAD (coronary artery disease)    a. CABG IN 1989. b. 01/08/2015 CTO of ost LAD, LIMA to LAD not visualized but assumed patent given myoview finding, occluded SVG to diagonal, 99% mid RCA tx w/ SYNERGY DES 3X28 mm   Constipation    Diabetes mellitus type 2, insulin dependent (HCC)    Epistaxis, recurrent Feb 2017   GERD (gastroesophageal reflux disease)    HTN (hypertension)    Hx of bacterial pneumonia    Hyperlipemia    Kidney stones    Mild aortic stenosis    Mild cognitive impairment 03/05/2021   MMSE 26/30 and 6CIT 9 03/2021 AWV   Pneumonia 04/2019   Rib fractures    left rib fractures being treated with pain medications   Stented coronary artery Nov 2016   RCA DES   Urinary tract infection     Enterococcus    Family History  Problem Relation Age of Onset   Brain cancer Father    Throat cancer Brother     Past Surgical History:  Procedure Laterality Date   [place] N/A 01/08/2015   Procedure: Left Heart Cath and Cors/Grafts Angiography;  Surgeon: Peter M Martinique, MD;  Location: [facility];  Service: Cardiovascular;  Laterality: N/A;   CARDIAC CATHETERIZATION  01/08/2015   Procedure: Coronary Stent Intervention;  Surgeon: Peter M Martinique, MD;  Location: [facility];  Service: Cardiovascular;;   CARPAL TUNNEL RELEASE Right 08/2010   CATARACT EXTRACTION W/ INTRAOCULAR LENS  IMPLANT, BILATERAL Bilateral    CORONARY ANGIOPLASTY  01/08/15   RCA DES   CORONARY ARTERY  BYPASS GRAFT  1989   "CABG X 2"   IR THORACENTESIS ASP PLEURAL SPACE W/IMG GUIDE  05/14/2019   JOINT REPLACEMENT     TONSILLECTOMY     TOTAL HIP ARTHROPLASTY Right 2000   Social History   Occupational History   Occupation: Agricultural consultant  Tobacco Use   Smoking status: Former    Packs/day: 3.00    Years: 10.00    Pack years: 30.00    Types: Cigarettes    Quit date: 05/11/1958    Years since quitting: 63.2   Smokeless tobacco: Never  Vaping Use   Vaping Use: Never used  Substance and Sexual Activity   Alcohol use: No   Drug use: No   Sexual activity: Not Currently

## 2021-07-16 DIAGNOSIS — M7061 Trochanteric bursitis, right hip: Secondary | ICD-10-CM

## 2021-07-16 MED ORDER — LIDOCAINE HCL 1 % IJ SOLN
3.0000 mL | INTRAMUSCULAR | Status: AC | PRN
  Administered 2021-07-16: 3 mL

## 2021-07-16 MED ORDER — BUPIVACAINE HCL 0.5 % IJ SOLN
3.0000 mL | INTRAMUSCULAR | Status: AC | PRN
  Administered 2021-07-16: 3 mL via INTRA_ARTICULAR

## 2021-07-16 MED ORDER — METHYLPREDNISOLONE ACETATE 40 MG/ML IJ SUSP
40.0000 mg | INTRAMUSCULAR | Status: AC | PRN
  Administered 2021-07-16: 40 mg via INTRA_ARTICULAR

## 2021-07-28 ENCOUNTER — Other Ambulatory Visit: Payer: Self-pay

## 2021-07-28 MED ORDER — FUROSEMIDE 40 MG PO TABS: ORAL_TABLET | ORAL | 3 refills | Status: DC

## 2021-08-07 ENCOUNTER — Telehealth: Payer: Self-pay | Admitting: Pharmacist

## 2021-08-07 NOTE — Progress Notes (Signed)
Chronic Care Management Pharmacy Assistant   Name: [name]  MRN: [id number] DOB: 12-25-1928   Reason for Encounter: Diabetes Adherence Call    Recent office visits:  07/08/2021 OV (PCP) [name], MD;Hydrocodone to be used in the interim.  Recent consult visits:  None since last adherence call  Hospital visits:  None since last adherence call  Medications: Outpatient Encounter Medications as of 08/07/2021  Medication Sig Note   apixaban (ELIQUIS) 5 MG TABS tablet Take 1 tablet (5 mg total) by mouth 2 (two) times daily.    atorvastatin (LIPITOR) 40 MG tablet Take 1 tablet (40 mg total) by mouth daily at 6 PM.    AVODART 0.5 MG capsule Take 0.5 mg by mouth every Monday, Wednesday, and Friday.     Cholecalciferol 50 MCG (2000 UT) TABS Take 1 tablet by mouth daily.    diltiazem (TIAZAC) 360 MG 24 hr capsule Take 1 capsule by mouth daily.    empagliflozin (JARDIANCE) 25 MG TABS tablet Take 1 tablet (25 mg total) by mouth daily before breakfast.    fluticasone (FLONASE) 50 MCG/ACT nasal spray SHAKE LIQUID AND USE 1 SPRAY IN EACH NOSTRIL DAILY    furosemide (LASIX) 40 MG tablet TAKE 1 TABLET(40 MG) BY MOUTH DAILY    gabapentin (NEURONTIN) 300 MG capsule TAKE 1 CAPSULE(300 MG) BY MOUTH THREE TIMES DAILY    insulin glargine (LANTUS) 100 UNIT/ML injection Inject 40 Units into the skin every morning.    metFORMIN (GLUCOPHAGE) 500 MG tablet Take 500 mg by mouth 2 (two) times daily with a meal.    metoprolol succinate (TOPROL-XL) 100 MG 24 hr tablet Take 1 tablet (100 mg total) by mouth daily. Take with or immediately following a meal.    nitroGLYCERIN (NITROSTAT) 0.4 MG SL tablet Place 0.4 mg under the tongue every 5 (five) minutes as needed for chest pain (x 3 doses). 02/15/2020: As needed   pantoprazole (PROTONIX) 40 MG tablet TAKE 1 TABLET BY MOUTH DAILY AT NOON    potassium chloride SA (KLOR-CON) 20 MEQ tablet TAKE 1 TABLET(20 MEQ) BY MOUTH DAILY    PRECISION XTRA TEST  STRIPS test strip USE TO TEST BLOOD SUGAR 1 TO 2 TIMES DAILY    tamsulosin (FLOMAX) 0.4 MG CAPS capsule Take 0.4 mg by mouth every other day.    No facility-administered encounter medications on file as of 08/07/2021.   Recent Relevant Labs: Lab Results  Component Value Date/Time   HGBA1C 7.6 (A) 05/11/2021 10:27 AM   HGBA1C 7.4 (A) 02/09/2021 09:31 AM   HGBA1C 8.1 (H) 05/26/2020 10:11 AM   HGBA1C 8.4 11/21/2019 12:00 AM   HGBA1C 9.0 02/09/2018 12:00 AM   MICROALBUR <0.7 03/14/2019 08:37 AM   MICROALBUR 3.5 (H) 08/01/2017 09:40 AM    Kidney Function Lab Results  Component Value Date/Time   CREATININE 1.33 04/14/2021 12:00 PM   CREATININE 1.24 08/04/2020 12:01 PM   CREATININE 1.08 03/18/2015 11:31 AM   CREATININE 1.27 (H) 01/06/2015 10:26 AM   GFR 46.93 (L) 04/14/2021 12:00 PM   GFRNONAA 49 (L) 01/17/2020 11:14 AM   GFRAA 57 (L) 01/17/2020 11:14 AM    Current antihyperglycemic regimen:  Jardiance 25 mg daily Lantus 40 units every morning  What recent interventions/DTPs have been made to improve glycemic control:  No recent interventions or DTPs.  Have there been any recent hospitalizations or ED visits since last visit with CPP? No  Patient denies hypoglycemic symptoms.  Patient denies hyperglycemic symptoms.  How  often are you checking your blood sugar? once daily  What are your blood sugars ranging?  Fasting: 95-110   During the week, how often does your blood glucose drop below 70? Once a month  Are you checking your feet daily/regularly? Yes  Adherence Review: Is the patient currently on a STATIN medication? Yes Is the patient currently on ACE/ARB medication? No Does the patient have >5 day gap between last estimated fill dates? No  Care Gaps: Medicare Annual Wellness: Completed 03/05/2021 Ophthalmology Exam: Next due on 07/21/2021 Foot Exam: Next due on 08/22/2021 Hemoglobin A1C: 7.6% on 05/11/2021 Colonoscopy: Aged out  Future Appointments  Date Time  Provider [place]  08/19/2021  3:15 PM Gardiner Barefoot, DPM [facility]  10/22/2021 10:00 AM Martinique, Peter M, MD [facility]  10/27/2021  3:00 PM [facility] CCM PHARMACIST [facility]  11/11/2021 10:00 AM [name], MD [facility]  03/11/2022  8:45 AM [facility] HEALTH COACH [facility]  05/17/2022  1:00 PM Rankin, Clent Demark, MD [facility]DE-RDE None   Star Rating Drugs: No fill dates available for Jardiance, Lantus and Atorvastatin  April D Calhoun, [place] Pharmacist Assistant [phone]

## 2021-08-19 ENCOUNTER — Encounter: Payer: Self-pay | Admitting: Podiatry

## 2021-08-19 ENCOUNTER — Ambulatory Visit (INDEPENDENT_AMBULATORY_CARE_PROVIDER_SITE_OTHER): Payer: Medicare Other | Admitting: Podiatry

## 2021-08-19 DIAGNOSIS — E1142 Type 2 diabetes mellitus with diabetic polyneuropathy: Secondary | ICD-10-CM

## 2021-08-19 DIAGNOSIS — M79674 Pain in right toe(s): Secondary | ICD-10-CM | POA: Diagnosis not present

## 2021-08-19 DIAGNOSIS — B351 Tinea unguium: Secondary | ICD-10-CM

## 2021-08-19 DIAGNOSIS — D689 Coagulation defect, unspecified: Secondary | ICD-10-CM

## 2021-08-19 NOTE — Progress Notes (Signed)
This patient returns to my office for at risk foot care.  This patient requires this care by a professional since this patient will be at risk due to having diabetic neuropathy and coagulation defect.  Patient is taking eliquiss.  Patient says toenails are painful walking and wearing his shoes.  This patient presents for at risk foot care today.  General Appearance  Alert, conversant and in no acute stress.  Vascular  Dorsalis pedis and posterior tibial  pulses are weakly  palpable  bilaterally.  Capillary return is within normal limits  bilaterally. Temperature is within normal limits  bilaterally.  Neurologic  Senn-Weinstein monofilament wire test within normal limits  bilaterally. Muscle power within normal limits bilaterally.  Nails Thick disfigured discolored nails with subungual debris  from hallux to fifth toes bilaterally. No evidence of bacterial infection or drainage bilaterally.  Orthopedic  No limitations of motion  feet .  No crepitus or effusions noted.  No bony pathology or digital deformities noted.  Skin  normotropic skin with no porokeratosis noted bilaterally.  No signs of infections or ulcers noted.     Onychomycosis  B/L  Consent was obtained for treatment procedures.   Debridement of nails with nail nipper followed by dremel tool. Filed with dremel without incident.    Return office visit    10   weeks                Told patient to return for periodic foot care and evaluation due to potential at risk complications.   Gardiner Barefoot DPM

## 2021-09-09 ENCOUNTER — Telehealth: Payer: Self-pay | Admitting: Cardiology

## 2021-09-09 MED ORDER — METOPROLOL SUCCINATE ER 100 MG PO TB24: 100.0000 mg | ORAL_TABLET | Freq: Every day | ORAL | 2 refills | Status: DC

## 2021-09-09 NOTE — Telephone Encounter (Signed)
*  STAT* If patient is at the pharmacy, call can be transferred to refill team.   1. Which medications need to be refilled? (please list name of each medication and dose if known)   metoprolol succinate (TOPROL-XL) 100 MG 24 hr tablet    2. Which pharmacy/location (including street and city if local pharmacy) is medication to be sent to? [id number] - [place], [place] - [address]   3. Do they need a 30 day or 90 day supply?  90 day

## 2021-09-29 ENCOUNTER — Encounter (HOSPITAL_BASED_OUTPATIENT_CLINIC_OR_DEPARTMENT_OTHER): Payer: Self-pay | Admitting: Emergency Medicine

## 2021-09-29 ENCOUNTER — Emergency Department (HOSPITAL_BASED_OUTPATIENT_CLINIC_OR_DEPARTMENT_OTHER)
Admission: EM | Admit: 2021-09-29 | Discharge: 2021-09-29 | Disposition: A | Discharge status: Home / Self Care | Payer: Medicare Other | Attending: Emergency Medicine | Admitting: Emergency Medicine

## 2021-09-29 ENCOUNTER — Other Ambulatory Visit: Payer: Self-pay

## 2021-09-29 ENCOUNTER — Other Ambulatory Visit (HOSPITAL_BASED_OUTPATIENT_CLINIC_OR_DEPARTMENT_OTHER): Payer: Self-pay

## 2021-09-29 DIAGNOSIS — R221 Localized swelling, mass and lump, neck: Secondary | ICD-10-CM | POA: Diagnosis present

## 2021-09-29 DIAGNOSIS — I10 Essential (primary) hypertension: Secondary | ICD-10-CM | POA: Diagnosis not present

## 2021-09-29 DIAGNOSIS — I889 Nonspecific lymphadenitis, unspecified: Secondary | ICD-10-CM | POA: Insufficient documentation

## 2021-09-29 DIAGNOSIS — Z7901 Long term (current) use of anticoagulants: Secondary | ICD-10-CM | POA: Diagnosis not present

## 2021-09-29 DIAGNOSIS — M542 Cervicalgia: Secondary | ICD-10-CM | POA: Diagnosis not present

## 2021-09-29 MED ORDER — LIDOCAINE 5 % EX PTCH
1.0000 | MEDICATED_PATCH | CUTANEOUS | 0 refills | Status: DC
  Filled 2021-09-29: qty 30, 30d supply, fill #0

## 2021-09-29 MED ORDER — ACETAMINOPHEN 500 MG PO TABS
1000.0000 mg | ORAL_TABLET | Freq: Once | ORAL | Status: AC
  Administered 2021-09-29: 1000 mg via ORAL
  Filled 2021-09-29: qty 2

## 2021-09-29 MED ORDER — AMOXICILLIN-POT CLAVULANATE 875-125 MG PO TABS
1.0000 | ORAL_TABLET | Freq: Two times a day (BID) | ORAL | 0 refills | Status: DC
  Filled 2021-09-29: qty 14, 7d supply, fill #0

## 2021-09-29 MED ORDER — LIDOCAINE 5 % EX PTCH
1.0000 | MEDICATED_PATCH | CUTANEOUS | Status: DC
  Administered 2021-09-29: 1 via TRANSDERMAL
  Filled 2021-09-29: qty 1

## 2021-09-29 NOTE — ED Triage Notes (Signed)
Pt arrived via EMS from Home. Pt has a visible lump on the distal left posterior portion of his neck. Pt was unaware of same until approx 3 days ago.

## 2021-09-29 NOTE — ED Provider Notes (Signed)
[facility] Provider Note   CSN: [id number] Arrival date & time: 09/29/21  0751     History  Chief Complaint  Patient presents with   Neck Pain    [name] is a 88 y.o. male.   Neck Pain   88 year old male presenting to the emergency department with a neck mass that is causing pain.  He states that symptoms have been ongoing for the past 3 days.  He denies any fever or chills.  He has a visible lump on the distal left posterior portion of his neck.  It is tender to palpation.  He denies any other complaints.  Home Medications Prior to Admission medications   Medication Sig Start Date End Date Taking? Authorizing Provider  amoxicillin-clavulanate (AUGMENTIN) 875-125 MG tablet Take 1 tablet by mouth every 12 (twelve) hours. 09/29/21  Yes Regan Lemming, MD  lidocaine (LIDODERM) 5 % Place 1 patch onto the skin daily. Remove & Discard patch within 12 hours or as directed by MD 09/29/21  Yes Regan Lemming, MD  apixaban (ELIQUIS) 5 MG TABS tablet Take 1 tablet (5 mg total) by mouth 2 (two) times daily. 06/08/16   Reyne Dumas, MD  atorvastatin (LIPITOR) 40 MG tablet Take 1 tablet (40 mg total) by mouth daily at 6 PM. 06/08/16   Reyne Dumas, MD  AVODART 0.5 MG capsule Take 0.5 mg by mouth every Monday, Wednesday, and Friday.  07/20/11   [provider]  Cholecalciferol 50 MCG (2000 UT) TABS Take 1 tablet by mouth daily. 05/15/20   [provider]  diltiazem (TIAZAC) 360 MG 24 hr capsule Take 1 capsule by mouth daily. 11/08/19   [provider]  empagliflozin (JARDIANCE) 25 MG TABS tablet Take 1 tablet (25 mg total) by mouth daily before breakfast. 09/03/20   [place], Peter M, MD  fluticasone [facility]) 50 MCG/ACT nasal spray SHAKE LIQUID AND USE 1 SPRAY IN [facility] NOSTRIL DAILY 05/20/21   [name], MD  furosemide (LASIX) 40 MG tablet TAKE 1 TABLET(40 MG) BY MOUTH DAILY 07/28/21   [place], Peter M, MD  gabapentin (NEURONTIN) 300 MG  capsule TAKE 1 CAPSULE(300 MG) BY MOUTH THREE TIMES DAILY 11/24/20   [name], MD  insulin glargine (LANTUS) 100 UNIT/ML injection Inject 40 Units into the skin every morning.    [provider]  metFORMIN (GLUCOPHAGE) 500 MG tablet Take 500 mg by mouth 2 (two) times daily with a meal.    [provider]  metoprolol succinate (TOPROL-XL) 100 MG 24 hr tablet Take 1 tablet (100 mg total) by mouth daily. Take with or immediately following a meal. 09/09/21   [place], Peter M, MD  nitroGLYCERIN (NITROSTAT) 0.4 MG SL tablet Place 0.4 mg under the tongue every 5 (five) minutes as needed for chest pain (x 3 doses).    [provider]  pantoprazole (PROTONIX) 40 MG tablet TAKE 1 TABLET BY MOUTH DAILY AT NOON 05/25/21   [place], Peter M, MD  potassium chloride SA (KLOR-CON) 20 MEQ tablet TAKE 1 TABLET(20 MEQ) BY MOUTH DAILY 12/22/20   [place], Peter M, MD  PRECISION XTRA TEST STRIPS test strip USE TO TEST BLOOD SUGAR 1 TO 2 TIMES DAILY 05/16/19   [name], MD  tamsulosin (FLOMAX) 0.4 MG CAPS capsule Take 0.4 mg by mouth every other day. 03/19/21   [provider]      Allergies    Erythromycin base, Septra [sulfamethoxazole-trimethoprim], Cefadroxil, Ciprofloxacin, and Erythromycin    Review of  Systems   Review of Systems  Musculoskeletal:  Positive for neck pain.  All other systems reviewed and are negative.   Physical Exam Updated Vital Signs BP (!) 153/79 (BP Location: Right Arm)   Pulse 83   Temp 97.7 F (36.5 C) (Oral)   Resp 16   Ht '5\' 6"'$  (1.676 m)   Wt 90.7 kg   SpO2 100%   BMI 32.28 kg/m  Physical Exam Vitals and nursing note reviewed.  Constitutional:      General: He is not in acute distress. HENT:     Head: Normocephalic and atraumatic.  Eyes:     Conjunctiva/sclera: Conjunctivae normal.     Pupils: Pupils are equal, round, and reactive to light.  Neck:     Comments: Well-circumscribed, mobile mass that is tender to palpation,  roughly 2 cm in diameter along the left posterior cervical chain.  No surrounding cellulitis.  No midline tenderness to palpation, range of motion intact Cardiovascular:     Rate and Rhythm: Normal rate and regular rhythm.  Pulmonary:     Effort: Pulmonary effort is normal. No respiratory distress.  Abdominal:     General: There is no distension.     Tenderness: There is no guarding.  Musculoskeletal:        General: No deformity or signs of injury.     Cervical back: Neck supple.  Skin:    Findings: No lesion or rash.  Neurological:     General: No focal deficit present.     Mental Status: He is alert. Mental status is at baseline.     GCS: GCS eye subscore is 4. GCS verbal subscore is 5. GCS motor subscore is 6.     Cranial Nerves: Cranial nerves 2-12 are intact.     Comments: 5/5 strength in all 4 extremities with intact sensation to light touch     ED Results / Procedures / Treatments   Labs (all labs ordered are listed, but only abnormal results are displayed) Labs Reviewed - No data to display  EKG None  Radiology No results found.  Procedures Ultrasound ED Soft Tissue  Date/Time: 09/29/2021 8:25 AM  Performed by: Regan Lemming, MD Authorized by: Regan Lemming, MD   Procedure details:    Indications: localization of abscess     Transverse view:  Visualized   Longitudinal view:  Visualized   Images: not archived   Location:    Location: neck     Side:  Left Findings:     no abscess present    no cellulitis present Comments:     Mass identified consistent with either lymph node or cyst, no surrounding cellulitis no abscess     Medications Ordered in ED Medications  lidocaine (LIDODERM) 5 % 1 patch (1 patch Transdermal Patch Applied 09/29/21 0823)  acetaminophen (TYLENOL) tablet 1,000 mg (1,000 mg Oral Given 09/29/21 4034)    ED Course/ Medical Decision Making/ A&P                           Medical Decision Making Risk OTC drugs. Prescription drug  management.   88 year old male presenting to the emergency department with a neck mass that is causing pain.  He states that symptoms have been ongoing for the past 3 days.  He denies any fever or chills.  He has a visible lump on the distal left posterior portion of his neck.  It is tender to palpation.  He denies  any other complaints.  On arrival, the patient was vitally stable, normal sinus rhythm noted on cardiac telemetry.  Patient presenting with 3 days of a painful lump on his left neck.  Differential diagnosis includes infected cyst, lymphadenitis, abscess.  An ultrasound was performed point-of-care of the soft tissues which revealed a mass with no evidence of surrounding soft tissue infection.  No evidence of abscess or indication for drainage at this time.  No midline tenderness of the spine.  No recent falls or trauma.  Low concern for fracture.  Low concern for epidural abscess.  We will treat the patient with a course of Augmentin, prescribed lidocaine patch for pain control and advised follow-up with his PCP in several days for repeat assessment.  Overall stable for discharge.  Final Clinical Impression(s) / ED Diagnoses Final diagnoses:  Lymphadenitis    Rx / DC Orders ED Discharge Orders          Ordered    amoxicillin-clavulanate (AUGMENTIN) 875-125 MG tablet  Every 12 hours        09/29/21 0818    lidocaine (LIDODERM) 5 %  Every 24 hours        09/29/21 0827              Regan Lemming, MD 09/29/21 [phone]

## 2021-09-29 NOTE — Discharge Instructions (Addendum)
Your symptoms are due to lymphadenitis (an infected lymph node) or an infected cyst. There was no evidence of abscess on ultrasound. Recommend a short course of antibiotics and follow-up with your PCP as needed. Take your Augmentin on a full stomach and with a Probiotic supplement to minimize stomach upset.

## 2021-09-29 NOTE — ED Notes (Signed)
Patient verbalizes understanding of discharge instructions. Opportunity for questioning and answers were provided. Patient discharged from ED.  °

## 2021-09-30 ENCOUNTER — Ambulatory Visit (INDEPENDENT_AMBULATORY_CARE_PROVIDER_SITE_OTHER): Payer: Medicare Other | Admitting: Internal Medicine

## 2021-09-30 VITALS — BP 110/72 | HR 88 | Temp 97.9°F | Ht 66.0 in

## 2021-09-30 DIAGNOSIS — M542 Cervicalgia: Secondary | ICD-10-CM

## 2021-09-30 DIAGNOSIS — Z9861 Coronary angioplasty status: Secondary | ICD-10-CM

## 2021-09-30 DIAGNOSIS — I2510 Atherosclerotic heart disease of native coronary artery without angina pectoris: Secondary | ICD-10-CM | POA: Diagnosis not present

## 2021-09-30 DIAGNOSIS — L049 Acute lymphadenitis, unspecified: Secondary | ICD-10-CM

## 2021-09-30 DIAGNOSIS — R52 Pain, unspecified: Secondary | ICD-10-CM | POA: Diagnosis not present

## 2021-09-30 MED ORDER — OXYCODONE-ACETAMINOPHEN 10-325 MG PO TABS: 1.0000 | ORAL_TABLET | Freq: Three times a day (TID) | ORAL | 0 refills | Status: DC | PRN

## 2021-09-30 NOTE — Progress Notes (Signed)
   [name] is a 88 y.o. male who presents today for an office visit.  Assessment/Plan:  Overview:   Due to severe intractable pain preventing sleep and nsaid ci ckd, I agreed to a few days of percocet to manage the pain since the lymphadenitis seems to be improving and he just needs pain mgmt. Also I had him see his pcp in a week or 2 to make sure the augmentin resolves the large lymph node problem- might need extended course.   Problem List Items Addressed This Visit   None Visit Diagnoses     Neck pain    -  Primary   Intractable pain               Subjective:  HPI:  Patient presents c/o severe pain over swollen tender lymph node on back of neck.  Was diagnosed with lymphadenitis in ER and given augmentin and size I simproving but the pain is worsening.  Cant take nsaids d/t ckd 3.  ER gave nothing for pain.  Only got 3 hour sleep last night.  Topical medicines arent helping- tried lidocaine and others.   Pain severe, hurts to talk or move neck.          Objective:  Physical Exam: BP 110/72   Pulse 88   Temp 97.9 F (36.6 C) (Temporal)   Ht '5\' 6"'$  (1.676 m)   SpO2 91%   BMI 32.28 kg/m    Gen: No acute distress, resting uncomfortably in wheelchair, seems tired Psych: Normal affect and thought content  Problem specific physical exam findings:  Tender 3 cm swelling that feels like a large left posterior cervical chain lymph node.        Loralee Pacas, MD 09/30/2021 9:07 AM

## 2021-10-04 ENCOUNTER — Encounter (HOSPITAL_COMMUNITY): Payer: Self-pay | Admitting: Internal Medicine

## 2021-10-04 ENCOUNTER — Emergency Department (HOSPITAL_COMMUNITY): Payer: Medicare Other

## 2021-10-04 ENCOUNTER — Other Ambulatory Visit: Payer: Self-pay

## 2021-10-04 ENCOUNTER — Inpatient Hospital Stay (HOSPITAL_COMMUNITY)
Admission: EM | Admit: 2021-10-04 | Discharge: 2021-10-07 | DRG: 871 | Disposition: A | Discharge status: Home / Self Care | Payer: Medicare Other | Attending: Internal Medicine | Admitting: Internal Medicine

## 2021-10-04 DIAGNOSIS — A419 Sepsis, unspecified organism: Secondary | ICD-10-CM | POA: Diagnosis not present

## 2021-10-04 DIAGNOSIS — E1169 Type 2 diabetes mellitus with other specified complication: Secondary | ICD-10-CM | POA: Diagnosis present

## 2021-10-04 DIAGNOSIS — R221 Localized swelling, mass and lump, neck: Secondary | ICD-10-CM | POA: Diagnosis not present

## 2021-10-04 DIAGNOSIS — R0902 Hypoxemia: Secondary | ICD-10-CM | POA: Diagnosis present

## 2021-10-04 DIAGNOSIS — Z794 Long term (current) use of insulin: Secondary | ICD-10-CM

## 2021-10-04 DIAGNOSIS — E782 Mixed hyperlipidemia: Secondary | ICD-10-CM | POA: Diagnosis present

## 2021-10-04 DIAGNOSIS — R591 Generalized enlarged lymph nodes: Secondary | ICD-10-CM | POA: Diagnosis present

## 2021-10-04 DIAGNOSIS — D6489 Other specified anemias: Secondary | ICD-10-CM | POA: Diagnosis present

## 2021-10-04 DIAGNOSIS — Z20822 Contact with and (suspected) exposure to covid-19: Secondary | ICD-10-CM | POA: Diagnosis present

## 2021-10-04 DIAGNOSIS — I482 Chronic atrial fibrillation, unspecified: Secondary | ICD-10-CM | POA: Diagnosis present

## 2021-10-04 DIAGNOSIS — R059 Cough, unspecified: Secondary | ICD-10-CM | POA: Diagnosis not present

## 2021-10-04 DIAGNOSIS — Z955 Presence of coronary angioplasty implant and graft: Secondary | ICD-10-CM

## 2021-10-04 DIAGNOSIS — Z6834 Body mass index (BMI) 34.0-34.9, adult: Secondary | ICD-10-CM | POA: Diagnosis not present

## 2021-10-04 DIAGNOSIS — N400 Enlarged prostate without lower urinary tract symptoms: Secondary | ICD-10-CM | POA: Diagnosis present

## 2021-10-04 DIAGNOSIS — E66811 Obesity, class 1: Secondary | ICD-10-CM | POA: Diagnosis present

## 2021-10-04 DIAGNOSIS — J189 Pneumonia, unspecified organism: Secondary | ICD-10-CM | POA: Diagnosis present

## 2021-10-04 DIAGNOSIS — Z951 Presence of aortocoronary bypass graft: Secondary | ICD-10-CM

## 2021-10-04 DIAGNOSIS — Z882 Allergy status to sulfonamides status: Secondary | ICD-10-CM

## 2021-10-04 DIAGNOSIS — I712 Thoracic aortic aneurysm, without rupture, unspecified: Secondary | ICD-10-CM | POA: Diagnosis not present

## 2021-10-04 DIAGNOSIS — Z96641 Presence of right artificial hip joint: Secondary | ICD-10-CM | POA: Diagnosis present

## 2021-10-04 DIAGNOSIS — E669 Obesity, unspecified: Secondary | ICD-10-CM | POA: Diagnosis not present

## 2021-10-04 DIAGNOSIS — I5033 Acute on chronic diastolic (congestive) heart failure: Secondary | ICD-10-CM | POA: Diagnosis not present

## 2021-10-04 DIAGNOSIS — Z8619 Personal history of other infectious and parasitic diseases: Secondary | ICD-10-CM

## 2021-10-04 DIAGNOSIS — Z8701 Personal history of pneumonia (recurrent): Secondary | ICD-10-CM | POA: Diagnosis not present

## 2021-10-04 DIAGNOSIS — K219 Gastro-esophageal reflux disease without esophagitis: Secondary | ICD-10-CM | POA: Diagnosis present

## 2021-10-04 DIAGNOSIS — E11649 Type 2 diabetes mellitus with hypoglycemia without coma: Secondary | ICD-10-CM | POA: Diagnosis present

## 2021-10-04 DIAGNOSIS — I2510 Atherosclerotic heart disease of native coronary artery without angina pectoris: Secondary | ICD-10-CM | POA: Diagnosis not present

## 2021-10-04 DIAGNOSIS — Z881 Allergy status to other antibiotic agents status: Secondary | ICD-10-CM

## 2021-10-04 DIAGNOSIS — Z9842 Cataract extraction status, left eye: Secondary | ICD-10-CM

## 2021-10-04 DIAGNOSIS — E1121 Type 2 diabetes mellitus with diabetic nephropathy: Secondary | ICD-10-CM

## 2021-10-04 DIAGNOSIS — Z87442 Personal history of urinary calculi: Secondary | ICD-10-CM

## 2021-10-04 DIAGNOSIS — I700 Atherosclerosis of aorta: Secondary | ICD-10-CM | POA: Diagnosis not present

## 2021-10-04 DIAGNOSIS — Z7901 Long term (current) use of anticoagulants: Secondary | ICD-10-CM

## 2021-10-04 DIAGNOSIS — N1832 Chronic kidney disease, stage 3b: Secondary | ICD-10-CM | POA: Diagnosis present

## 2021-10-04 DIAGNOSIS — G3184 Mild cognitive impairment, so stated: Secondary | ICD-10-CM | POA: Diagnosis present

## 2021-10-04 DIAGNOSIS — I4891 Unspecified atrial fibrillation: Secondary | ICD-10-CM | POA: Diagnosis not present

## 2021-10-04 DIAGNOSIS — N1831 Chronic kidney disease, stage 3a: Secondary | ICD-10-CM | POA: Diagnosis present

## 2021-10-04 DIAGNOSIS — Z87891 Personal history of nicotine dependence: Secondary | ICD-10-CM

## 2021-10-04 DIAGNOSIS — Z87440 Personal history of urinary (tract) infections: Secondary | ICD-10-CM

## 2021-10-04 DIAGNOSIS — E876 Hypokalemia: Secondary | ICD-10-CM | POA: Diagnosis present

## 2021-10-04 DIAGNOSIS — Z961 Presence of intraocular lens: Secondary | ICD-10-CM | POA: Diagnosis present

## 2021-10-04 DIAGNOSIS — I129 Hypertensive chronic kidney disease with stage 1 through stage 4 chronic kidney disease, or unspecified chronic kidney disease: Secondary | ICD-10-CM | POA: Diagnosis present

## 2021-10-04 DIAGNOSIS — R52 Pain, unspecified: Secondary | ICD-10-CM | POA: Diagnosis not present

## 2021-10-04 DIAGNOSIS — Z7984 Long term (current) use of oral hypoglycemic drugs: Secondary | ICD-10-CM

## 2021-10-04 DIAGNOSIS — I10 Essential (primary) hypertension: Secondary | ICD-10-CM | POA: Diagnosis not present

## 2021-10-04 DIAGNOSIS — M19041 Primary osteoarthritis, right hand: Secondary | ICD-10-CM | POA: Diagnosis present

## 2021-10-04 DIAGNOSIS — M19042 Primary osteoarthritis, left hand: Secondary | ICD-10-CM | POA: Diagnosis present

## 2021-10-04 DIAGNOSIS — E1122 Type 2 diabetes mellitus with diabetic chronic kidney disease: Secondary | ICD-10-CM | POA: Diagnosis present

## 2021-10-04 DIAGNOSIS — R000 Tachycardia, unspecified: Secondary | ICD-10-CM | POA: Diagnosis not present

## 2021-10-04 DIAGNOSIS — M1909 Primary osteoarthritis, other specified site: Secondary | ICD-10-CM | POA: Diagnosis present

## 2021-10-04 DIAGNOSIS — R4182 Altered mental status, unspecified: Secondary | ICD-10-CM | POA: Diagnosis not present

## 2021-10-04 DIAGNOSIS — I7121 Aneurysm of the ascending aorta, without rupture: Secondary | ICD-10-CM | POA: Diagnosis not present

## 2021-10-04 DIAGNOSIS — I152 Hypertension secondary to endocrine disorders: Secondary | ICD-10-CM | POA: Diagnosis present

## 2021-10-04 DIAGNOSIS — Z9841 Cataract extraction status, right eye: Secondary | ICD-10-CM

## 2021-10-04 DIAGNOSIS — Z79899 Other long term (current) drug therapy: Secondary | ICD-10-CM

## 2021-10-04 LAB — CBC WITH DIFFERENTIAL/PLATELET
Abs Immature Granulocytes: 0.06 10*3/uL (ref 0.00–0.07)
Basophils Absolute: 0.1 10*3/uL (ref 0.0–0.1)
Basophils Relative: 1 %
Eosinophils Absolute: 0.1 10*3/uL (ref 0.0–0.5)
Eosinophils Relative: 1 %
HCT: 37.7 % — ABNORMAL LOW (ref 39.0–52.0)
Hemoglobin: 11.7 g/dL — ABNORMAL LOW (ref 13.0–17.0)
Immature Granulocytes: 1 %
Lymphocytes Relative: 5 %
Lymphs Abs: 0.6 10*3/uL — ABNORMAL LOW (ref 0.7–4.0)
MCH: 25.6 pg — ABNORMAL LOW (ref 26.0–34.0)
MCHC: 31 g/dL (ref 30.0–36.0)
MCV: 82.5 fL (ref 80.0–100.0)
Monocytes Absolute: 0.5 10*3/uL (ref 0.1–1.0)
Monocytes Relative: 4 %
Neutro Abs: 10 10*3/uL — ABNORMAL HIGH (ref 1.7–7.7)
Neutrophils Relative %: 88 %
Platelets: 244 10*3/uL (ref 150–400)
RBC: 4.57 MIL/uL (ref 4.22–5.81)
RDW: 15.9 % — ABNORMAL HIGH (ref 11.5–15.5)
WBC: 11.2 10*3/uL — ABNORMAL HIGH (ref 4.0–10.5)
nRBC: 0 % (ref 0.0–0.2)

## 2021-10-04 LAB — COMPREHENSIVE METABOLIC PANEL
ALT: 12 U/L (ref 0–44)
AST: 16 U/L (ref 15–41)
Albumin: 3.6 g/dL (ref 3.5–5.0)
Alkaline Phosphatase: 61 U/L (ref 38–126)
Anion gap: 15 (ref 5–15)
BUN: 26 mg/dL — ABNORMAL HIGH (ref 8–23)
CO2: 23 mmol/L (ref 22–32)
Calcium: 9.1 mg/dL (ref 8.9–10.3)
Chloride: 98 mmol/L (ref 98–111)
Creatinine, Ser: 1.47 mg/dL — ABNORMAL HIGH (ref 0.61–1.24)
GFR, Estimated: 45 mL/min — ABNORMAL LOW (ref >60)
Glucose, Bld: 182 mg/dL — ABNORMAL HIGH (ref 70–99)
Potassium: 4.1 mmol/L (ref 3.5–5.1)
Sodium: 136 mmol/L (ref 135–145)
Total Bilirubin: 1 mg/dL (ref 0.3–1.2)
Total Protein: 6.5 g/dL (ref 6.5–8.1)

## 2021-10-04 LAB — URINALYSIS, ROUTINE W REFLEX MICROSCOPIC
Bacteria, UA: NONE SEEN
Bilirubin Urine: NEGATIVE
Glucose, UA: >=500 mg/dL — AB
Hgb urine dipstick: NEGATIVE
Ketones, ur: NEGATIVE mg/dL
Leukocytes,Ua: NEGATIVE
Nitrite: NEGATIVE
Protein, ur: NEGATIVE mg/dL
Specific Gravity, Urine: 1.024 (ref 1.005–1.030)
pH: 5 (ref 5.0–8.0)

## 2021-10-04 LAB — PROTIME-INR
INR: 1.3 — ABNORMAL HIGH (ref 0.8–1.2)
Prothrombin Time: 15.8 seconds — ABNORMAL HIGH (ref 11.4–15.2)

## 2021-10-04 LAB — LACTIC ACID, PLASMA
Lactic Acid, Venous: 2 mmol/L (ref 0.5–1.9)
Lactic Acid, Venous: 1.7 mmol/L (ref 0.5–1.9)

## 2021-10-04 LAB — GLUCOSE, CAPILLARY
Glucose-Capillary: 135 mg/dL — ABNORMAL HIGH (ref 70–99)
Glucose-Capillary: 106 mg/dL — ABNORMAL HIGH (ref 70–99)

## 2021-10-04 LAB — RESP PANEL BY RT-PCR (FLU A&B, COVID) ARPGX2
Influenza A by PCR: NEGATIVE
Influenza B by PCR: NEGATIVE
SARS Coronavirus 2 by RT PCR: NEGATIVE

## 2021-10-04 LAB — PROCALCITONIN: Procalcitonin: <0.1 ng/mL

## 2021-10-04 LAB — CBG MONITORING, ED: Glucose-Capillary: 184 mg/dL — ABNORMAL HIGH (ref 70–99)

## 2021-10-04 LAB — APTT: aPTT: 32 seconds (ref 24–36)

## 2021-10-04 MED ORDER — FLUTICASONE PROPIONATE 50 MCG/ACT NA SUSP
1.0000 | Freq: Every day | NASAL | Status: DC
  Administered 2021-10-07: 1 via NASAL
  Filled 2021-10-04: qty 16

## 2021-10-04 MED ORDER — ACETAMINOPHEN 650 MG RE SUPP: 650.0000 mg | Freq: Once | RECTAL | Status: DC

## 2021-10-04 MED ORDER — ACETAMINOPHEN 650 MG RE SUPP: 650.0000 mg | Freq: Four times a day (QID) | RECTAL | Status: DC | PRN

## 2021-10-04 MED ORDER — LACTATED RINGERS IV BOLUS (SEPSIS)
1000.0000 mL | Freq: Once | INTRAVENOUS | Status: AC
  Administered 2021-10-04: 1000 mL via INTRAVENOUS

## 2021-10-04 MED ORDER — INSULIN ASPART 100 UNIT/ML IJ SOLN: 0.0000 [IU] | Freq: Every day | INTRAMUSCULAR | Status: DC

## 2021-10-04 MED ORDER — VANCOMYCIN HCL IN DEXTROSE 1-5 GM/200ML-% IV SOLN: 1000.0000 mg | Freq: Once | INTRAVENOUS | Status: DC

## 2021-10-04 MED ORDER — SODIUM CHLORIDE 0.9 % IV SOLN: INTRAVENOUS | Status: AC

## 2021-10-04 MED ORDER — TAMSULOSIN HCL 0.4 MG PO CAPS
0.4000 mg | ORAL_CAPSULE | ORAL | Status: DC
  Administered 2021-10-04 – 2021-10-06 (×2): 0.4 mg via ORAL
  Filled 2021-10-04 (×2): qty 1

## 2021-10-04 MED ORDER — BISACODYL 5 MG PO TBEC: 5.0000 mg | DELAYED_RELEASE_TABLET | Freq: Every day | ORAL | Status: DC | PRN

## 2021-10-04 MED ORDER — METOPROLOL SUCCINATE ER 100 MG PO TB24
100.0000 mg | ORAL_TABLET | Freq: Every day | ORAL | Status: DC
  Administered 2021-10-04 – 2021-10-07 (×4): 100 mg via ORAL
  Filled 2021-10-04: qty 4
  Filled 2021-10-04 (×3): qty 1

## 2021-10-04 MED ORDER — METRONIDAZOLE 500 MG/100ML IV SOLN
500.0000 mg | Freq: Once | INTRAVENOUS | Status: AC
  Administered 2021-10-04: 500 mg via INTRAVENOUS
  Filled 2021-10-04: qty 100

## 2021-10-04 MED ORDER — SODIUM CHLORIDE 0.9 % IV SOLN
2.0000 g | INTRAVENOUS | Status: AC
  Administered 2021-10-05 – 2021-10-06 (×2): 2 g via INTRAVENOUS
  Filled 2021-10-04 (×2): qty 20

## 2021-10-04 MED ORDER — INSULIN GLARGINE-YFGN 100 UNIT/ML ~~LOC~~ SOLN
40.0000 [IU] | Freq: Every day | SUBCUTANEOUS | Status: DC
  Administered 2021-10-04 – 2021-10-05 (×2): 40 [IU] via SUBCUTANEOUS
  Filled 2021-10-04 (×4): qty 0.4

## 2021-10-04 MED ORDER — ONDANSETRON HCL 4 MG/2ML IJ SOLN: 4.0000 mg | Freq: Four times a day (QID) | INTRAMUSCULAR | Status: DC | PRN

## 2021-10-04 MED ORDER — SODIUM CHLORIDE 0.9 % IV SOLN
2.0000 g | Freq: Once | INTRAVENOUS | Status: AC
  Administered 2021-10-04: 2 g via INTRAVENOUS
  Filled 2021-10-04: qty 12.5

## 2021-10-04 MED ORDER — DOCUSATE SODIUM 100 MG PO CAPS
100.0000 mg | ORAL_CAPSULE | Freq: Two times a day (BID) | ORAL | Status: DC
  Administered 2021-10-04 – 2021-10-07 (×4): 100 mg via ORAL
  Filled 2021-10-04 (×4): qty 1

## 2021-10-04 MED ORDER — SODIUM CHLORIDE 0.9 % IV SOLN
500.0000 mg | INTRAVENOUS | Status: DC
  Administered 2021-10-05 – 2021-10-06 (×2): 500 mg via INTRAVENOUS
  Filled 2021-10-04 (×2): qty 5

## 2021-10-04 MED ORDER — LIDOCAINE 5 % EX PTCH
1.0000 | MEDICATED_PATCH | CUTANEOUS | Status: DC
Start: 2021-10-04 — End: 2021-10-07
  Administered 2021-10-05 – 2021-10-07 (×2): 1 via TRANSDERMAL
  Filled 2021-10-04 (×4): qty 1

## 2021-10-04 MED ORDER — GUAIFENESIN ER 600 MG PO TB12: 600.0000 mg | ORAL_TABLET | Freq: Two times a day (BID) | ORAL | Status: DC | PRN

## 2021-10-04 MED ORDER — VANCOMYCIN HCL 1750 MG/350ML IV SOLN
1750.0000 mg | Freq: Once | INTRAVENOUS | Status: AC
  Administered 2021-10-04: 1750 mg via INTRAVENOUS
  Filled 2021-10-04: qty 350

## 2021-10-04 MED ORDER — INSULIN ASPART 100 UNIT/ML IJ SOLN
0.0000 [IU] | Freq: Three times a day (TID) | INTRAMUSCULAR | Status: DC
  Administered 2021-10-04: 3 [IU] via SUBCUTANEOUS
  Administered 2021-10-04: 2 [IU] via SUBCUTANEOUS
  Administered 2021-10-05 – 2021-10-07 (×4): 3 [IU] via SUBCUTANEOUS

## 2021-10-04 MED ORDER — MORPHINE SULFATE (PF) 2 MG/ML IV SOLN: 2.0000 mg | INTRAVENOUS | Status: DC | PRN

## 2021-10-04 MED ORDER — ALBUTEROL SULFATE (2.5 MG/3ML) 0.083% IN NEBU
2.5000 mg | INHALATION_SOLUTION | RESPIRATORY_TRACT | Status: DC | PRN
Start: 2021-10-04 — End: 2021-10-05

## 2021-10-04 MED ORDER — OXYCODONE HCL 5 MG PO TABS: 5.0000 mg | ORAL_TABLET | ORAL | Status: DC | PRN

## 2021-10-04 MED ORDER — PANTOPRAZOLE SODIUM 40 MG PO TBEC
40.0000 mg | DELAYED_RELEASE_TABLET | Freq: Every day | ORAL | Status: DC
  Administered 2021-10-05 – 2021-10-07 (×3): 40 mg via ORAL
  Filled 2021-10-04 (×3): qty 1

## 2021-10-04 MED ORDER — ONDANSETRON HCL 4 MG PO TABS: 4.0000 mg | ORAL_TABLET | Freq: Four times a day (QID) | ORAL | Status: DC | PRN

## 2021-10-04 MED ORDER — APIXABAN 5 MG PO TABS
5.0000 mg | ORAL_TABLET | Freq: Two times a day (BID) | ORAL | Status: DC
  Administered 2021-10-04 – 2021-10-07 (×7): 5 mg via ORAL
  Filled 2021-10-04 (×7): qty 1

## 2021-10-04 MED ORDER — GABAPENTIN 300 MG PO CAPS
300.0000 mg | ORAL_CAPSULE | Freq: Three times a day (TID) | ORAL | Status: DC
  Administered 2021-10-04 – 2021-10-07 (×10): 300 mg via ORAL
  Filled 2021-10-04 (×10): qty 1

## 2021-10-04 MED ORDER — LACTATED RINGERS IV SOLN: INTRAVENOUS | Status: DC

## 2021-10-04 MED ORDER — SODIUM CHLORIDE 0.9% FLUSH
3.0000 mL | Freq: Two times a day (BID) | INTRAVENOUS | Status: DC
Start: 2021-10-04 — End: 2021-10-07
  Administered 2021-10-05 – 2021-10-06 (×3): 3 mL via INTRAVENOUS

## 2021-10-04 MED ORDER — DUTASTERIDE 0.5 MG PO CAPS
0.5000 mg | ORAL_CAPSULE | ORAL | Status: DC
  Administered 2021-10-05 – 2021-10-07 (×2): 0.5 mg via ORAL
  Filled 2021-10-04 (×2): qty 1

## 2021-10-04 MED ORDER — IOHEXOL 300 MG/ML  SOLN
75.0000 mL | Freq: Once | INTRAMUSCULAR | Status: AC | PRN
  Administered 2021-10-04: 75 mL via INTRAVENOUS

## 2021-10-04 MED ORDER — HYDRALAZINE HCL 20 MG/ML IJ SOLN: 5.0000 mg | INTRAMUSCULAR | Status: DC | PRN

## 2021-10-04 MED ORDER — ORAL CARE MOUTH RINSE: 15.0000 mL | OROMUCOSAL | Status: DC | PRN

## 2021-10-04 MED ORDER — POLYETHYLENE GLYCOL 3350 17 G PO PACK: 17.0000 g | PACK | Freq: Every day | ORAL | Status: DC | PRN

## 2021-10-04 MED ORDER — ACETAMINOPHEN 325 MG PO TABS
650.0000 mg | ORAL_TABLET | Freq: Four times a day (QID) | ORAL | Status: DC | PRN
Start: 2021-10-04 — End: 2021-10-05

## 2021-10-04 NOTE — Progress Notes (Signed)
Pt being followed by [facility] for Sepsis protocol. 

## 2021-10-04 NOTE — ED Triage Notes (Addendum)
Pt BIB [facility] from home. Pt woke up to urinate. Began to shake all over and felt cold. He called EMS.  Pt c/o generalized body pain, new cough, recent ABX for suspected neck infection. Pt is hypoxic at 84% RA on EMS arrival, agitated with EMS noncompliant with NRB, 6L [place] increased O2 90%. Tylenol 650 mg given by EMS in route.

## 2021-10-04 NOTE — Progress Notes (Signed)
Patient transferred from ED at 1444hrs.  Oriented to unit and plan of care for shift.  Afib on monitor with HR 110s, BP 120/66. Patient denies dizziness/SOB/CP.  Wife and daughter at bedside.

## 2021-10-04 NOTE — ED Provider Notes (Signed)
[facility] EMERGENCY DEPARTMENT Provider Note   CSN: [id number] Arrival date & time: 10/04/21  0406     History  Chief Complaint  Patient presents with   Generalized Body Aches    [name] is a 88 y.o. male.  88 y/o male with hx of HTN, HLD, CAD s/p CABG and subsequent DES, Afib (on chronic Eliquis), and DM presents to the ED for c/o chills and rigors. Symptoms started when patient woke to urinate at 0200. States he began to shake all over and feel cold. Also reporting new myalgias, cough. Hypoxic to 84% on room air with EMS, placed on 6L O2 via [place] during transport with sats improving to 90%. EMS also administered '650mg'$  APAP en route. The patient has been on Augmentin since 09/29/21 for suspected lymphadenitis. Complaining of thirst at present; insisting on having something to drink.  The history is provided by the EMS personnel. No language interpreter was used.       Home Medications Prior to Admission medications   Medication Sig Start Date End Date Taking? Authorizing Provider  amoxicillin-clavulanate (AUGMENTIN) 875-125 MG tablet Take 1 tablet by mouth every 12 (twelve) hours. 09/29/21   Regan Lemming, MD  apixaban (ELIQUIS) 5 MG TABS tablet Take 1 tablet (5 mg total) by mouth 2 (two) times daily. 06/08/16   Reyne Dumas, MD  atorvastatin (LIPITOR) 40 MG tablet Take 1 tablet (40 mg total) by mouth daily at 6 PM. 06/08/16   Reyne Dumas, MD  AVODART 0.5 MG capsule Take 0.5 mg by mouth every Monday, Wednesday, and Friday.  07/20/11   [provider]  Cholecalciferol 50 MCG (2000 UT) TABS Take 1 tablet by mouth daily. 05/15/20   [provider]  diltiazem (TIAZAC) 360 MG 24 hr capsule Take 1 capsule by mouth daily. 11/08/19   [provider]  empagliflozin (JARDIANCE) 25 MG TABS tablet Take 1 tablet (25 mg total) by mouth daily before breakfast. 09/03/20   Martinique, Peter M, MD  fluticasone [facility]) 50 MCG/ACT nasal spray SHAKE LIQUID AND  USE 1 SPRAY IN [facility] NOSTRIL DAILY 05/20/21   [name], MD  furosemide (LASIX) 40 MG tablet TAKE 1 TABLET(40 MG) BY MOUTH DAILY 07/28/21   [place], Peter M, MD  gabapentin (NEURONTIN) 300 MG capsule TAKE 1 CAPSULE(300 MG) BY MOUTH THREE TIMES DAILY 11/24/20   [name], MD  insulin glargine (LANTUS) 100 UNIT/ML injection Inject 40 Units into the skin every morning.    [provider]  lidocaine (LIDODERM) 5 % Place 1 patch onto the skin daily. Remove & Discard patch within 12 hours or as directed by MD 09/29/21   Regan Lemming, MD  metFORMIN (GLUCOPHAGE) 500 MG tablet Take 500 mg by mouth 2 (two) times daily with a meal.    [provider]  metoprolol succinate (TOPROL-XL) 100 MG 24 hr tablet Take 1 tablet (100 mg total) by mouth daily. Take with or immediately following a meal. 09/09/21   [place], Peter M, MD  nitroGLYCERIN (NITROSTAT) 0.4 MG SL tablet Place 0.4 mg under the tongue every 5 (five) minutes as needed for chest pain (x 3 doses).    [provider]  oxyCODONE-acetaminophen (PERCOCET) 10-325 MG tablet Take 1 tablet by mouth every 8 (eight) hours as needed for up to 5 days for pain. 09/30/21 10/05/21  Loralee Pacas, MD  pantoprazole (PROTONIX) 40 MG tablet TAKE 1 TABLET BY MOUTH DAILY AT [facility] 05/25/21   [place], Peter M, MD  potassium chloride SA (KLOR-CON) 20 MEQ tablet TAKE 1 TABLET(20 MEQ) BY MOUTH DAILY 12/22/20   [place], Peter M, MD  PRECISION XTRA TEST STRIPS test strip USE TO TEST BLOOD SUGAR 1 TO 2 TIMES DAILY 05/16/19   [name], MD  tamsulosin (FLOMAX) 0.4 MG CAPS capsule Take 0.4 mg by mouth every other day. 03/19/21   [provider]      Allergies    Erythromycin base, Septra [sulfamethoxazole-trimethoprim], Cefadroxil, Ciprofloxacin, and Erythromycin    Review of Systems   Review of Systems  Constitutional:  Positive for chills.  HENT:  Negative for sore throat.   Gastrointestinal:  Negative for diarrhea and vomiting.  Ten  systems reviewed and are negative for acute change, except as noted in the HPI.    Physical Exam Updated Vital Signs BP (!) 96/49   Pulse 96   Temp (!) 102.2 F (39 C) (Oral)   Resp (!) 23   Ht '5\' 6"'$  (1.676 m)   Wt 90.7 kg   SpO2 91%   BMI 32.28 kg/m   Physical Exam Vitals and nursing note reviewed.  Constitutional:      General: He is not in acute distress.    Appearance: He is well-developed. He is not diaphoretic.     Comments: Hyperactive mood, nontoxic appearing  HENT:     Head: Normocephalic and atraumatic.     Mouth/Throat:     Mouth: Mucous membranes are dry.     Comments: Mild cheilitis. Eyes:     General: No scleral icterus.    Extraocular Movements: Extraocular movements intact.     Conjunctiva/sclera: Conjunctivae normal.  Neck:     Comments: No meningismus  Cardiovascular:     Rate and Rhythm: Tachycardia present. Rhythm irregular.  Pulmonary:     Effort: Pulmonary effort is normal. No respiratory distress.     Comments: Respirations even and unlabored. No distinct wheezes or rales; difficult exam as patient won't stop talking. SpO2 90-93% on 2L via [place]. Abdominal:     Comments: Soft, nondistended, obese abdomen   Musculoskeletal:        General: Normal range of motion.     Cervical back: Normal range of motion.     Comments: No BLE edema  Skin:    General: Skin is warm and dry.     Coloration: Skin is not pale.     Findings: No erythema or rash.  Neurological:     Mental Status: He is alert and oriented to person, place, and time.     Coordination: Coordination normal.  Psychiatric:        Speech: Speech is rapid and pressured and tangential.        Behavior: Behavior normal.     ED Results / Procedures / Treatments   Labs (all labs ordered are listed, but only abnormal results are displayed) Labs Reviewed  COMPREHENSIVE METABOLIC PANEL - Abnormal; Notable for the following components:      Result Value   Glucose, Bld 182 (*)    BUN 26 (*)     Creatinine, Ser 1.47 (*)    GFR, Estimated 45 (*)    All other components within normal limits  LACTIC ACID, PLASMA - Abnormal; Notable for the following components:   Lactic Acid, Venous 2.0 (*)    All other components within normal limits  CBC WITH DIFFERENTIAL/PLATELET - Abnormal; Notable for the following components:   WBC 11.2 (*)    Hemoglobin 11.7 (*)  HCT 37.7 (*)    MCH 25.6 (*)    RDW 15.9 (*)    Neutro Abs 10.0 (*)    Lymphs Abs 0.6 (*)    All other components within normal limits  PROTIME-INR - Abnormal; Notable for the following components:   Prothrombin Time 15.8 (*)    INR 1.3 (*)    All other components within normal limits  RESP PANEL BY RT-PCR (FLU A&B, COVID) ARPGX2  CULTURE, BLOOD (ROUTINE X 2)  CULTURE, BLOOD (ROUTINE X 2)  URINE CULTURE  APTT  LACTIC ACID, PLASMA  URINALYSIS, ROUTINE W REFLEX MICROSCOPIC    EKG EKG Interpretation  Date/Time:  Sunday October 04 2021 04:20:12 EDT Ventricular Rate:  140 PR Interval:    QRS Duration: 79 QT Interval:  273 QTC Calculation: 417 R Axis:   68 Text Interpretation: Atrial fibrillation Repolarization abnormality, prob rate related Confirmed by Orpah Greek [phone]) on 10/04/2021 4:41:16 AM  Radiology DG Chest Portable 1 View  Result Date: 10/04/2021 CLINICAL DATA:  Cough.  Altered mental status. EXAM: PORTABLE CHEST 1 VIEW COMPARISON:  07/03/2020 FINDINGS: Previous median sternotomy and CABG procedure. Aortic atherosclerosis. Extent to a shin of the cardiac silhouette may reflect cardiomegaly. Small right pleural effusion. Large area of airspace consolidation is identified within the right upper and right lower lung. Left lung appears clear. Visualized osseous structures are unremarkable. IMPRESSION: Large area of airspace consolidation in the right upper and right lower lung compatible with pneumonia. Followup PA and lateral chest X-ray is recommended in 3-4 weeks following trial of antibiotic therapy  to ensure resolution and exclude underlying malignancy. Small right pleural effusion. Electronically Signed   By: Kerby Moors M.D.   On: 10/04/2021 05:35    Procedures .Critical Care  Performed by: Antonietta Breach, [name] Authorized by: Antonietta Breach, [name]   Critical care provider statement:    Critical care time (minutes):  45   Critical care time was exclusive of:  Separately billable procedures and treating other patients   Critical care was necessary to treat or prevent imminent or life-threatening deterioration of the following conditions:  Sepsis   Critical care was time spent personally by me on the following activities:  Development of treatment plan with patient or surrogate, discussions with consultants, evaluation of patient's response to treatment, examination of patient, ordering and review of laboratory studies, ordering and review of radiographic studies, ordering and performing treatments and interventions, pulse oximetry, re-evaluation of patient's condition, review of old charts and obtaining history from patient or surrogate   I assumed direction of critical care for this patient from another provider in my specialty: no     Care discussed with: admitting provider       Medications Ordered in ED Medications  lactated ringers infusion ( Intravenous New Bag/Given 10/04/21 0521)  vancomycin (VANCOREADY) IVPB 1750 mg/350 mL (1,750 mg Intravenous New Bag/Given 10/04/21 0534)  lactated ringers bolus 1,000 mL (has no administration in time range)  lactated ringers bolus 1,000 mL (1,000 mLs Intravenous New Bag/Given 10/04/21 0523)  ceFEPIme (MAXIPIME) 2 g in sodium chloride 0.9 % 100 mL IVPB (0 g Intravenous Stopped 10/04/21 0521)  metroNIDAZOLE (FLAGYL) IVPB 500 mg (0 mg Intravenous Stopped 10/04/21 0624)  iohexol (OMNIPAQUE) 300 MG/ML solution 75 mL (75 mLs Intravenous Contrast Given 10/04/21 0658)    ED Course/ Medical Decision Making/ A&P Clinical Course as of 10/04/21 0706  Sun Oct 04, 2021  0558 Chest x-ray concerning for pneumonia.  Given prior presentation for  neck mass consistent with lymphadenitis, will extend CT soft tissue neck through chest for further characterization of x-ray abnormalities.  Specifically, to try and further delineate for malignancy. [KH]    Clinical Course User Index [KH] Antonietta Breach, [name]                           Medical Decision Making Amount and/or Complexity of Data Reviewed Labs: ordered. Radiology: ordered. ECG/medicine tests: ordered.  Risk OTC drugs. Prescription drug management.   This patient presents to the ED for concern of chills and rigors, this involves an extensive number of treatment options, and is a complaint that carries with it a high risk of complications and morbidity.  The differential diagnosis includes undifferentiated sepsis vs PNA vs viral illness.   Co morbidities that complicate the patient evaluation  HTN CAD s/p CABG Afib Chronic anticoagulant use   Additional history obtained:  Additional history obtained from EMS, wife   Lab Tests:  I Ordered, and personally interpreted labs.  The pertinent results include:  leukocytosis of 11.2 w/left shift, lactic acid level of 2.0, mild AKI w/creatinine of 1.47 (baseline ~1.25)   Imaging Studies ordered:  I ordered imaging studies including CXR  I independently visualized and interpreted imaging which showed large consolidation in the RUL and RLL c/w PNA I agree with the radiologist interpretation CT head, soft tissue neck, and chest pending.   Cardiac Monitoring:  The patient was maintained on a cardiac monitor.  I personally viewed and interpreted the cardiac monitored which showed an underlying rhythm of: Afib w/RVR   Medicines ordered and prescription drug management:  I ordered medication including IV vancomycin, cefepime, flagyl  per sepsis protocol for broad abx coverage. Also given IV LR Reevaluation of the patient after these medicines  showed that the patient improved I have reviewed the patients home medicines and have made adjustments as needed   Test Considered:  CT abdomen/pelvis given concern for malignancy based on CXR and preceding hx of lymphadenitis.   Critical Interventions:  IVF resuscitation IV abx coverage per sepsis protocol Maintenance of supplemental O2 via [place] for hypoxia Continuous cardiac monitoring   Consultations Obtained:  I requested consultation with the hospitalist service; pending discussions regarding lab and imaging findings as well as pertinent plan - anticipate admission for continued management   Reevaluation:  After the interventions noted above, I reevaluated the patient and found that they have :improved   Social Determinants of Health:  Insured patient Good social support   Dispostion:  After consideration of the diagnostic results and the patients response to treatment, I feel that the patent would benefit from admission for ongoing abx and eventual titration off supplemental O2. Care signed out to [facility], [name] pending consultation with the hospitalist service.          Final Clinical Impression(s) / ED Diagnoses Final diagnoses:  Sepsis, due to unspecified organism, unspecified whether acute organ dysfunction present [facility])  Community acquired pneumonia of right lung, unspecified part of lung  Hypoxemia  Atrial fibrillation with RVR [facility])    Rx / DC Orders ED Discharge Orders     None         Antonietta Breach, [name] 10/04/21 0706    Orpah Greek, MD 10/12/21 [phone]

## 2021-10-04 NOTE — ED Notes (Signed)
Patient transported to CT 

## 2021-10-04 NOTE — ED Notes (Signed)
RN notified Dr. Lorin Mercy to speak with pt per his request because he was out of it this morning.

## 2021-10-04 NOTE — Progress Notes (Signed)
   10/04/21 1444  Assess: MEWS Score  Temp 98.3 F (36.8 C)  BP 120/66  MAP (mmHg) 82  Pulse Rate (!) 118  ECG Heart Rate (!) 118  Resp (!) 22  SpO2 99 %  O2 Device Nasal Cannula  O2 Flow Rate (L/min) 2 L/min  Assess: MEWS Score  MEWS Temp 0  MEWS Systolic 0  MEWS Pulse 2  MEWS RR 1  MEWS LOC 0  MEWS Score 3  MEWS Score Color Yellow  Assess: if the MEWS score is Yellow or Red  Were vital signs taken at a resting state? Yes  Focused Assessment No change from prior assessment  Does the patient meet 2 or more of the SIRS criteria? Yes  Does the patient have a confirmed or suspected source of infection? Yes  Provider and Rapid Response Notified? Yes  MEWS guidelines implemented *See Row Information* Yes  Treat  MEWS Interventions Other (Comment) (No new interventions ordered at this time)  Pain Scale 0-10  Pain Score 0  Take Vital Signs  Increase Vital Sign Frequency  Yellow: Q 2hr X 2 then Q 4hr X 2, if remains yellow, continue Q 4hrs  Escalate  MEWS: Escalate Yellow: discuss with charge nurse/RN and consider discussing with provider and RRT  Notify: Charge Nurse/RN  Name of Charge Nurse/RN Notified Janett Billow RN  Date Charge Nurse/RN Notified 10/04/21  Time Charge Nurse/RN Notified 1550  Notify: Provider  Provider Name/Title Dr. Lorin Mercy  Date Provider Notified 10/04/21  Time Provider Notified 1551  Method of Notification Page  Notification Reason Other (Comment) (new arrival from ED, yellow MEWS)  Provider response No new orders  Date of Provider Response 10/04/21  Time of Provider Response 1552  Document  Patient Outcome Other (Comment) (remains stable)  Progress note created (see row info) Yes  Assess: SIRS CRITERIA  SIRS Temperature  0  SIRS Pulse 1  SIRS Respirations  1  SIRS WBC 1  SIRS Score Sum  3

## 2021-10-04 NOTE — H&P (Signed)
History and Physical    Patient: [name] YQM:[id number] DOB: 1928/07/02 DOA: 10/04/2021 DOS: the patient was seen and examined on 10/04/2021 PCP: [name], MD  Patient coming from: Home - lives with wife; NOK: Wife, [phone]   Chief Complaint: Myalgias  HPI: [name] is a 88 y.o. male with medical history significant of afib; CAD s/p CABG; HTN; DM; HLD; and MCI presenting with generalized body aches.  He was seen on 8/1 for neck pain associated with a mass (distal left posterior neck).  [place] was done showing a mass without surrounding soft tissue infection.  He was treated with Augmentin and a lidocaine patch.   History obtained from his wife since he was sleeping for most of the evaluation.  His wife reports that it started about a week ago, wasn't feeling well.  They went to [facility] - he had a lump on his neck with limited mobility and lots of pain.  He was given Augmentin and lidocaine without improvement.  She called 911 because he had diffuse chills since this AM about 0200.  He was fine as of last night until he woke up overnight with chills and weakness.  He felt SOB.      ER Course:  Chills, afib with RVR, sepsis.  Abx, IVF, 2L O2 (now).  CT neck - previously soft tissue infection last week and given Augmentin.     Review of Systems: As mentioned in the history of present illness. All other systems reviewed and are negative. Past Medical History:  Diagnosis Date   Abdominal pain    Acute renal failure ([place])     resolved   Arthritis    "hands & legs" (11/'10/2014)   Ataxia    Atrial fibrillation (HCC)    Bladder outlet obstruction    Bladder outlet obstruction    BPH (benign prostatic hyperplasia)    CAD (coronary artery disease)    a. CABG IN 1989. b. 01/08/2015 CTO of ost LAD, LIMA to LAD not visualized but assumed patent given myoview finding, occluded SVG to diagonal, 99% mid RCA tx w/ SYNERGY DES 3X28 mm   Constipation    Diabetes mellitus type 2,  insulin dependent ([place])    Epistaxis, recurrent Feb 2017   GERD (gastroesophageal reflux disease)    HTN (hypertension)    Hx of bacterial pneumonia    Hyperlipemia    Kidney stones    Mild aortic stenosis    Mild cognitive impairment 03/05/2021   MMSE 26/30 and 6CIT 9 03/2021 AWV   Pneumonia 04/2019   Rib fractures    left rib fractures being treated with pain medications   Stented coronary artery Nov 2016   RCA DES   Urinary tract infection     Enterococcus   Past Surgical History:  Procedure Laterality Date   [place] N/A 01/08/2015   Procedure: Left Heart Cath and Cors/Grafts Angiography;  Surgeon: Peter M Martinique, MD;  Location: [facility];  Service: Cardiovascular;  Laterality: N/A;   CARDIAC CATHETERIZATION  01/08/2015   Procedure: Coronary Stent Intervention;  Surgeon: Peter M Martinique, MD;  Location: [facility];  Service: Cardiovascular;;   CARPAL TUNNEL RELEASE Right 08/2010   CATARACT EXTRACTION W/ INTRAOCULAR LENS  IMPLANT, BILATERAL Bilateral    CORONARY ANGIOPLASTY  01/08/15   RCA DES   CORONARY ARTERY BYPASS GRAFT  1989   "CABG X 2"   IR THORACENTESIS ASP PLEURAL SPACE W/IMG GUIDE  05/14/2019   JOINT REPLACEMENT     TONSILLECTOMY     TOTAL HIP ARTHROPLASTY Right 2000   Social History:  reports that he quit smoking about 63 years ago. His smoking use included cigarettes. He has a 30.00 pack-year smoking history. He has never used smokeless tobacco. He reports that he does not drink alcohol and does not use drugs.  Allergies  Allergen Reactions   Septra [Sulfamethoxazole-Trimethoprim] Itching   Cefadroxil Other (See Comments)    Unknown reaction    Ciprofloxacin Other (See Comments)    Unknown action     Erythromycin Other (See Comments)    Upset stomach     Family History  Problem Relation Age of Onset   Brain cancer Father    Throat cancer Brother     Prior to Admission medications    Medication Sig Start Date End Date Taking? Authorizing Provider  amoxicillin-clavulanate (AUGMENTIN) 875-125 MG tablet Take 1 tablet by mouth every 12 (twelve) hours. 09/29/21   Regan Lemming, MD  apixaban (ELIQUIS) 5 MG TABS tablet Take 1 tablet (5 mg total) by mouth 2 (two) times daily. 06/08/16   Reyne Dumas, MD  atorvastatin (LIPITOR) 40 MG tablet Take 1 tablet (40 mg total) by mouth daily at 6 PM. 06/08/16   Reyne Dumas, MD  AVODART 0.5 MG capsule Take 0.5 mg by mouth every Monday, Wednesday, and Friday.  07/20/11   [provider]  Cholecalciferol 50 MCG (2000 UT) TABS Take 1 tablet by mouth daily. 05/15/20   [provider]  diltiazem (TIAZAC) 360 MG 24 hr capsule Take 1 capsule by mouth daily. 11/08/19   [provider]  empagliflozin (JARDIANCE) 25 MG TABS tablet Take 1 tablet (25 mg total) by mouth daily before breakfast. 09/03/20   Martinique, Peter M, MD  fluticasone [facility]) 50 MCG/ACT nasal spray SHAKE LIQUID AND USE 1 SPRAY IN [facility] NOSTRIL DAILY 05/20/21   [name], MD  furosemide (LASIX) 40 MG tablet TAKE 1 TABLET(40 MG) BY MOUTH DAILY 07/28/21   Martinique, Peter M, MD  gabapentin (NEURONTIN) 300 MG capsule TAKE 1 CAPSULE(300 MG) BY MOUTH THREE TIMES DAILY 11/24/20   [name], MD  insulin glargine (LANTUS) 100 UNIT/ML injection Inject 40 Units into the skin every morning.    [provider]  lidocaine (LIDODERM) 5 % Place 1 patch onto the skin daily. Remove & Discard patch within 12 hours or as directed by MD 09/29/21   Regan Lemming, MD  metFORMIN (GLUCOPHAGE) 500 MG tablet Take 500 mg by mouth 2 (two) times daily with a meal.    [provider]  metoprolol succinate (TOPROL-XL) 100 MG 24 hr tablet Take 1 tablet (100 mg total) by mouth daily. Take with or immediately following a meal. 09/09/21   Martinique, Peter M, MD  nitroGLYCERIN (NITROSTAT) 0.4 MG SL tablet Place 0.4 mg under the tongue every 5 (five) minutes as needed for chest pain (x 3  doses).    [provider]  oxyCODONE-acetaminophen (PERCOCET) 10-325 MG tablet Take 1 tablet by mouth every 8 (eight) hours as needed for up to 5 days for pain. 09/30/21 10/05/21  Loralee Pacas, MD  pantoprazole (PROTONIX) 40 MG tablet TAKE 1 TABLET BY MOUTH DAILY AT NOON 05/25/21   Martinique, Peter M, MD  potassium chloride SA (KLOR-CON) 20 MEQ tablet TAKE 1 TABLET(20 MEQ) BY MOUTH DAILY 12/22/20   Martinique, Peter M, MD  PRECISION XTRA TEST STRIPS test strip USE TO TEST BLOOD SUGAR  1 TO 2 TIMES DAILY 05/16/19   [name], MD  tamsulosin (FLOMAX) 0.4 MG CAPS capsule Take 0.4 mg by mouth every other day. 03/19/21   [provider]    Physical Exam: Vitals:   10/04/21 0630 10/04/21 0636 10/04/21 0733 10/04/21 0800  BP: (!) 96/49  106/61   Pulse: 71 96 100 93  Resp:  (!) '23 17 18  '$ Temp:   98 F (36.7 C)   TempSrc:   Oral   SpO2: 92% 91% 98% 96%  Weight:      Height:       General:  Appears calm and comfortable and is in NAD, sleeping comfortably Eyes:  EOMI, normal lids, iris ENT:  grossly normal hearing, lips & tongue, mmm Neck:  no LAD, masses or thyromegaly Cardiovascular:  RR with mild tachycardia, no m/r/g. No LE edema.  Respiratory:   Diffuse right-sided rhonchi.  Normal respiratory effort.  On 2L [place] O2 Abdomen:  soft, NT, ND Skin:  no rash or induration seen on limited exam Musculoskeletal:  grossly normal tone BUE/BLE, good ROM, no bony abnormality Psychiatric:  somnolent mood and affect, speech fluent and appropriate, ?mildly confused Neurologic:  CN 2-12 grossly intact, moves all extremities in coordinated fashion   Radiological Exams on Admission: Independently reviewed - see discussion in A/P where applicable  CT Chest W Contrast  Result Date: 10/04/2021 CLINICAL DATA:  Pneumonia.  Evaluate for complication. EXAM: CT CHEST WITH CONTRAST TECHNIQUE: Multidetector CT imaging of the chest was performed during intravenous contrast administration. RADIATION  DOSE REDUCTION: This exam was performed according to the departmental dose-optimization program which includes automated exposure control, adjustment of the mA and/or kV according to patient size and/or use of iterative reconstruction technique. CONTRAST:  70m OMNIPAQUE IOHEXOL 300 MG/ML  SOLN COMPARISON:  02/04/2020 FINDINGS: Cardiovascular: Normal heart size. Previous median sternotomy and CABG procedure. Extensive aortic atherosclerosis. Ascending thoracic aorta measures 4.3 cm in diameter, unchanged from previous exam no pericardial effusion. Mediastinum/Nodes: Thyroid gland, trachea and esophagus are unremarkable. No axillary, mediastinal, or hilar adenopathy. Lungs/Pleura: No pleural effusion. Multifocal airspace disease is identified within the right upper lobe and right lower lobe compatible with multifocal pneumonia. Scarring is identified within the posterior left lower lobe. Upper Abdomen: No acute abnormality. Small hiatal hernia. Gallstones. Nonobstructing stone within upper pole of right kidney measures 3 mm. Musculoskeletal: No chest wall abnormality. No acute or significant osseous findings. Multiple healed left lateral rib fracture deformities identified. IMPRESSION: 1. Multifocal airspace disease is identified within the right upper lobe and right lower lobe compatible with multifocal pneumonia. Followup PA and lateral chest X-ray is recommended in 3-4 weeks following trial of antibiotic therapy to ensure resolution and exclude underlying malignancy. 2. Gallstones. 3. Nonobstructing right renal calculus. 4. Aortic Atherosclerosis (ICD10-I70.0). 5. Stable ascending thoracic aortic aneurysm measuring 4.3 cm. Recommend annual imaging followup by CTA or MRA. This recommendation follows 2010 ACCF/AHA/AATS/ACR/ASA/SCA/SCAI/SIR/STS/SVM Guidelines for the Diagnosis and Management of Patients with Thoracic Aortic Disease. Circulation. 2010; 121:: [id number] Aortic aneurysm NOS (ICD10-I71.9) Electronically  Signed   By: TKerby MoorsM.D.   On: 10/04/2021 07:40   CT HEAD WO CONTRAST (5MM)  Result Date: 10/04/2021 CLINICAL DATA:  Mental status change with unknown cause EXAM: CT HEAD WITHOUT CONTRAST TECHNIQUE: Contiguous axial images were obtained from the base of the skull through the vertex without intravenous contrast. RADIATION DOSE REDUCTION: This exam was performed according to the departmental dose-optimization program which includes automated exposure control, adjustment of the  mA and/or kV according to patient size and/or use of iterative reconstruction technique. COMPARISON:  Brain MRI 06/06/2016 FINDINGS: Brain: No evidence of acute infarction, hemorrhage, hydrocephalus, extra-axial collection or mass lesion/mass effect. Generalized brain atrophy. Vascular: Scattered atheromatous calcification Skull: Normal. Negative for fracture or focal lesion. Sinuses/Orbits: No acute finding. IMPRESSION: No acute finding. Electronically Signed   By: Jorje Guild M.D.   On: 10/04/2021 07:38   CT Soft Tissue Neck W Contrast  Result Date: 10/04/2021 CLINICAL DATA:  Neck mass, nonpulsatile.  Sepsis. EXAM: CT NECK WITH CONTRAST TECHNIQUE: Multidetector CT imaging of the neck was performed using the standard protocol following the bolus administration of intravenous contrast. RADIATION DOSE REDUCTION: This exam was performed according to the departmental dose-optimization program which includes automated exposure control, adjustment of the mA and/or kV according to patient size and/or use of iterative reconstruction technique. CONTRAST:  64m OMNIPAQUE IOHEXOL 300 MG/ML  SOLN COMPARISON:  None similar FINDINGS: Pharynx and larynx: No evidence of mass or inflammation. Salivary glands: No inflammation, mass, or stone. Thyroid: Unremarkable Lymph nodes: None enlarged or heterogeneous Vascular: Extensive atheromatous calcification. Major vessels are enhancing. Limited intracranial: No acute finding Visualized orbits:  Bilateral cataract resection Mastoids and visualized paranasal sinuses: Clear Skeleton: Generalized cervical spine degeneration. No acute or aggressive finding. Upper chest: Extensive airspace disease in the right lung. IMPRESSION: 1. No acute finding in the neck. 2. Partial coverage of extensive right upper lobe pneumonia. Electronically Signed   By: JJorje GuildM.D.   On: 10/04/2021 07:20   DG Chest Portable 1 View  Result Date: 10/04/2021 CLINICAL DATA:  Cough.  Altered mental status. EXAM: PORTABLE CHEST 1 VIEW COMPARISON:  07/03/2020 FINDINGS: Previous median sternotomy and CABG procedure. Aortic atherosclerosis. Extent to a shin of the cardiac silhouette may reflect cardiomegaly. Small right pleural effusion. Large area of airspace consolidation is identified within the right upper and right lower lung. Left lung appears clear. Visualized osseous structures are unremarkable. IMPRESSION: Large area of airspace consolidation in the right upper and right lower lung compatible with pneumonia. Followup PA and lateral chest X-ray is recommended in 3-4 weeks following trial of antibiotic therapy to ensure resolution and exclude underlying malignancy. Small right pleural effusion. Electronically Signed   By: TKerby MoorsM.D.   On: 10/04/2021 05:35    EKG: Independently reviewed.  Afib with rate 140; nonspecific ST changes with no evidence of acute ischemia   Labs on Admission: I have personally reviewed the available labs and imaging studies at the time of the admission.  Pertinent labs:    Glucose 182 BUN 26/Creatinine 1.47/GFR 45 - stable Lactate 2.0 WBC 11.2 Hgb 11.7  INR 1.3 COVID/flu negative   Assessment and Plan: Principal Problem:   Sepsis due to pneumonia ([place] Active Problems:   Hx of CABG x 2 1990   Essential (primary) hypertension   Diabetes mellitus with diabetic nephropathy, with long-term current use of insulin (HCC)   Mixed hyperlipidemia   Chronic atrial  fibrillation (HCC)   Enlarged prostate without lower urinary tract symptoms (luts)   Obesity (BMI 30.0-34.9)   Stage 3b chronic kidney disease ([place]   Neck mass    Sepsis due to PNA -SIRS criteria in this patient includes: Leukocytosis, fever, tachycardia, tachypnea, hypoxia  -Patient has evidence of acute organ failure with elevated lactate >2 that is not easily explained by another condition. -While awaiting blood cultures, this appears to be a preseptic condition. -Sepsis protocol initiated -Suspected source is PNA -Patient presenting  with fever to 102, mildly decreased oxygen saturation, and infiltrate in right upper and lower lobes on chest x-ray -This appears to be most likely community-acquired pneumonia - although he was treated with Augmentin recently and so may need broadening of coverage if not improving with current treatment. -Influenza negative. -COVID-19 negative. -Blood and sputum cultures have been ordered. -Will order lower respiratory tract procalcitonin level.   >0.5 indicates infection and >>0.5 indicates more serious disease.  As the procalcitonin level normalizes, it will be reasonable to consider de-escalation of antibiotic coverage.  The sensitivity of procalcitonin is variable and should not be used alone to guide treatment. -CURB-65 score is 3, meaning that the patient has a 14% risk of death -Pneumonia Severity Index (PSI) is Class 4, 9% mortality. -Will start Azithromycin 500 mg IV daily and Rocephin; he was given Cefepime and Vanc initially in the ER and may need broadening of antibiotics if not improving with current regimen. -Additional complicating factors include: hypoxia; failure of outpatient antibiotics -NS @ 75cc/hr -Fever control -Repeat CBC in am -Will add albuterol PRN -Will add Mucinex for cough -Blood and urine cultures pending -Will admit to progressive care due to: hemodynamic instability; hypoxemia; failure of outpatient treatment  Neck  mass -Recent posterior neck mass, not seen today on imaging -?reactive lymph node -No further evaluation/treatment is needed -Continue Lidoderm if needed  Afib -Rate controlled with diltiazem (hold for now) and Toprol XL (continue for now) -Continue Eliquis -He had RVR on arrival, likely associated with fever; this is improved now  CAD -s/p CABG -No current report of CP  DM -Recent A1c was 7.6, which is at goal for his age -hold Glucophage -Continue Lantus -Cover with moderate-scale SSI  -Continue Neurontin  HTN -Hold diltiazem -Continue Toprol XL  HLD -Continue Lipitor  BPH -Continue Avodart and tamsulosin  Stage 3b CKD -Appears to be stable -Hold Jardiance  Obesity -Body mass index is 32.28 kg/m..  -Weight loss should be encouraged -Outpatient PCP/bariatric medicine f/u encouraged     Advance Care Planning:   Code Status: Full Code   Consults: PT/OT; RT  DVT Prophylaxis: Eliquis  Family Communication: Wife was present throughout evaluation  Severity of Illness: The appropriate patient status for this patient is INPATIENT. Inpatient status is judged to be reasonable and necessary in order to provide the required intensity of service to ensure the patient's safety. The patient's presenting symptoms, physical exam findings, and initial radiographic and laboratory data in the context of their chronic comorbidities is felt to place them at high risk for further clinical deterioration. Furthermore, it is not anticipated that the patient will be medically stable for discharge from the hospital within 2 midnights of admission.   * I certify that at the point of admission it is my clinical judgment that the patient will require inpatient hospital care spanning beyond 2 midnights from the point of admission due to high intensity of service, high risk for further deterioration and high frequency of surveillance required.*  Author: Karmen Bongo, MD 10/04/2021 10:38  AM  For on call review [email or web address].

## 2021-10-04 NOTE — ED Notes (Signed)
XR at bedside

## 2021-10-04 NOTE — ED Notes (Signed)
Pt is confused asking for his wife.

## 2021-10-05 DIAGNOSIS — J189 Pneumonia, unspecified organism: Secondary | ICD-10-CM | POA: Diagnosis not present

## 2021-10-05 DIAGNOSIS — A419 Sepsis, unspecified organism: Secondary | ICD-10-CM | POA: Diagnosis not present

## 2021-10-05 LAB — BASIC METABOLIC PANEL
Anion gap: 7 (ref 5–15)
BUN: 20 mg/dL (ref 8–23)
CO2: 26 mmol/L (ref 22–32)
Calcium: 8.7 mg/dL — ABNORMAL LOW (ref 8.9–10.3)
Chloride: 104 mmol/L (ref 98–111)
Creatinine, Ser: 1.24 mg/dL (ref 0.61–1.24)
GFR, Estimated: 55 mL/min — ABNORMAL LOW (ref >60)
Glucose, Bld: 111 mg/dL — ABNORMAL HIGH (ref 70–99)
Potassium: 3.4 mmol/L — ABNORMAL LOW (ref 3.5–5.1)
Sodium: 137 mmol/L (ref 135–145)

## 2021-10-05 LAB — CBC
HCT: 31.7 % — ABNORMAL LOW (ref 39.0–52.0)
Hemoglobin: 9.7 g/dL — ABNORMAL LOW (ref 13.0–17.0)
MCH: 25.3 pg — ABNORMAL LOW (ref 26.0–34.0)
MCHC: 30.6 g/dL (ref 30.0–36.0)
MCV: 82.6 fL (ref 80.0–100.0)
Platelets: 212 10*3/uL (ref 150–400)
RBC: 3.84 MIL/uL — ABNORMAL LOW (ref 4.22–5.81)
RDW: 16.1 % — ABNORMAL HIGH (ref 11.5–15.5)
WBC: 18.5 10*3/uL — ABNORMAL HIGH (ref 4.0–10.5)
nRBC: 0 % (ref 0.0–0.2)

## 2021-10-05 LAB — GLUCOSE, CAPILLARY
Glucose-Capillary: 92 mg/dL (ref 70–99)
Glucose-Capillary: 157 mg/dL — ABNORMAL HIGH (ref 70–99)
Glucose-Capillary: 98 mg/dL (ref 70–99)
Glucose-Capillary: 106 mg/dL — ABNORMAL HIGH (ref 70–99)

## 2021-10-05 MED ORDER — DILTIAZEM HCL ER COATED BEADS 180 MG PO CP24
360.0000 mg | ORAL_CAPSULE | Freq: Every day | ORAL | Status: DC
  Administered 2021-10-05 – 2021-10-07 (×3): 360 mg via ORAL
  Filled 2021-10-05 (×3): qty 2

## 2021-10-05 MED ORDER — OXYCODONE HCL 5 MG PO TABS
5.0000 mg | ORAL_TABLET | ORAL | Status: DC | PRN
  Administered 2021-10-05: 5 mg via ORAL
  Filled 2021-10-05: qty 1

## 2021-10-05 MED ORDER — DILTIAZEM HCL ER BEADS 240 MG PO CP24: 360.0000 mg | ORAL_CAPSULE | Freq: Every day | ORAL | Status: DC

## 2021-10-05 MED ORDER — DILTIAZEM HCL ER BEADS 120 MG PO CP24: 360.0000 mg | ORAL_CAPSULE | Freq: Every day | ORAL | Status: DC

## 2021-10-05 MED ORDER — ACETAMINOPHEN 325 MG PO TABS
650.0000 mg | ORAL_TABLET | Freq: Four times a day (QID) | ORAL | Status: DC | PRN
  Administered 2021-10-06: 650 mg via ORAL
  Filled 2021-10-05: qty 2

## 2021-10-05 MED ORDER — POTASSIUM CHLORIDE CRYS ER 20 MEQ PO TBCR
40.0000 meq | EXTENDED_RELEASE_TABLET | Freq: Once | ORAL | Status: AC
  Administered 2021-10-05: 40 meq via ORAL
  Filled 2021-10-05: qty 2

## 2021-10-05 NOTE — Care Management (Signed)
  Transition of Care [facility]) Screening Note   Patient Details  Name: Arlie Riker Date of Birth: 07-18-1928   Transition of Care [facility]) CM/SW Contact:    Bethena Roys, RN Phone Number: 10/05/2021, 2:53 PM    Transition of Care Department [facility]) has reviewed the patient and no TOC needs have been identified at this time. Case Manager will continue to monitor patient advancement through interdisciplinary progression rounds. If new patient transition needs arise, please place a TOC consult.

## 2021-10-05 NOTE — Evaluation (Signed)
Occupational Therapy Evaluation Patient Details Name: [name] MRN: [id number] DOB: May 29, 1928 Today's Date: 10/05/2021   History of Present Illness Pt adm 8/6 with sepsis due to PNA. Pt with recent neck mass but not seen on current imaging. PMH - cabg, HTN, DM, afib   Clinical Impression   PTA pt lives independently with his wife, who assists as needed with LB ADL tasks. Pt is very independent, occasionally uses a cane in the community and drives. Able to complete ADL task and ambulate @ 100 ft with min guard A @ RW level on RA. Max HR 126 and SpO2 remained above 90 throughout. Noted increased WOB, however improved with rest. Family can provide 24/7 assistance after DC. Acute OT to follow to facilitate safe DC home however do not feel he will need OT follow up after DC.  Recommend pt ambulate with mobility specialists and staff     Recommendations for follow up therapy are one component of a multi-disciplinary discharge planning process, led by the attending physician.  Recommendations may be updated based on patient status, additional functional criteria and insurance authorization.   Follow Up Recommendations  No OT follow up    Assistance Recommended at Discharge Frequent or constant Supervision/Assistance  Patient can return home with the following A little help with walking and/or transfers;A little help with bathing/dressing/bathroom;Assistance with cooking/housework;Assist for transportation    Functional Status Assessment  Patient has had a recent decline in their functional status and demonstrates the ability to make significant improvements in function in a reasonable and predictable amount of time.  Equipment Recommendations  Rollator   Recommendations for Other Services PT consult     Precautions / Restrictions Precautions Precautions: Fall      Mobility Bed Mobility               General bed mobility comments: OOB in chair    Transfers Overall  transfer level: Needs assistance Equipment used: Rolling walker (2 wheels) Transfers: Sit to/from Stand Sit to Stand: Min guard           General transfer comment: uses momentum/rocking; uses a lift chair at home      Balance Overall balance assessment: Mild deficits observed, not formally tested                                         ADL either performed or assessed with clinical judgement   ADL Overall ADL's : Needs assistance/impaired                         Toilet Transfer: Rolling walker (2 wheels);Min guard;Grab bars   Toileting- Clothing Manipulation and Hygiene: Min guard       Functional mobility during ADLs: Min guard;Rolling walker (2 wheels) (uses rocking adn momentum to stand; uses a lift chair at home) General ADL Comments: Min A with LB ADL taks; increased SOB with activity; set up for UB tasks     Vision Baseline Vision/History: 1 Wears glasses Vision Assessment?: No apparent visual deficits     Perception     Praxis      Pertinent Vitals/Pain Pain Assessment Pain Assessment: No/denies pain     Hand Dominance Left   Extremity/Trunk Assessment Upper Extremity Assessment Upper Extremity Assessment: Generalized weakness   Lower Extremity Assessment Lower Extremity Assessment: Defer to PT evaluation   Cervical / Trunk Assessment  Cervical / Trunk Assessment: Normal   Communication Communication Communication: No difficulties;HOH   Cognition Arousal/Alertness: Awake/alert Behavior During Therapy: WFL for tasks assessed/performed Overall Cognitive Status: Within Functional Limits for tasks assessed                                       General Comments       Exercises Exercises: Other exercises Other Exercises Other Exercises: pursed lip breathing   Shoulder Instructions      Home Living Family/patient expects to be discharged to:: Private residence Living Arrangements: Spouse/significant  other Available Help at Discharge: Family;Available 24 hours/day Type of Home: House Home Access: Level entry     Home Layout: One level     Bathroom Shower/Tub: Occupational psychologist: Handicapped height Bathroom Accessibility: Yes How Accessible: Accessible via walker Home Equipment: [place] (2 wheels);Cane - single point;Shower seat;Grab bars - toilet;Grab bars - tub/shower          Prior Functioning/Environment Prior Level of Function : Needs assist;Driving               ADLs Comments: wife assists with socks        OT Problem List: Decreased strength;Decreased activity tolerance;Impaired balance (sitting and/or standing);Decreased knowledge of use of DME or AE;Cardiopulmonary status limiting activity;Obesity      OT Treatment/Interventions: Self-care/ADL training;Therapeutic exercise    OT Goals(Current goals can be found in the care plan section) Acute Rehab OT Goals Patient Stated Goal: to go home today OT Goal Formulation: With patient Time For Goal Achievement: 10/19/21 Potential to Achieve Goals: Good  OT Frequency: Min 2X/week    Co-evaluation              AM-PAC OT "6 Clicks" Daily Activity     Outcome Measure Help from another person eating meals?: None Help from another person taking care of personal grooming?: A Little Help from another person toileting, which includes using toliet, bedpan, or urinal?: A Little Help from another person bathing (including washing, rinsing, drying)?: A Little Help from another person to put on and taking off regular upper body clothing?: A Little Help from another person to put on and taking off regular lower body clothing?: A Little 6 Click Score: 19   End of Session Equipment Utilized During Treatment: Gait belt;Rolling walker (2 wheels) Nurse Communication: Mobility status  Activity Tolerance: Patient tolerated treatment well Patient left: in chair;with call bell/phone within  reach;with family/visitor present  OT Visit Diagnosis: Unsteadiness on feet (R26.81);Muscle weakness (generalized) (M62.81)                Time: 2119-4174 OT Time Calculation (min): 29 min Charges:  OT General Charges $OT Visit: 1 Visit OT Evaluation $OT Eval Moderate Complexity: 1 Mod OT Treatments $Self Care/Home Management : 8-22 mins  Maurie Boettcher, OT/L   Acute OT Clinical Specialist [place] Pager [phone] Office [phone]   [facility] 10/05/2021, 12:58 PM

## 2021-10-05 NOTE — Evaluation (Signed)
Physical Therapy Evaluation Patient Details Name: [name] MRN: [id number] DOB: 10/20/1928 Today's Date: 10/05/2021  History of Present Illness  Pt adm 8/6 with sepsis due to PNA. Pt with recent neck mass but not seen on current imaging. PMH - cabg, HTN, DM, afib  Clinical Impression  Pt presents to PT with decreased mobility due to illness and inactivity. Expect pt will make good progress back to baseline with mobility. Recommend a rollator for home. Likely he won't need therapy after DC but will continue to assess for DC needs.         Recommendations for follow up therapy are one component of a multi-disciplinary discharge planning process, led by the attending physician.  Recommendations may be updated based on patient status, additional functional criteria and insurance authorization.  Follow Up Recommendations No PT follow up      Assistance Recommended at Discharge Intermittent Supervision/Assistance  Patient can return home with the following  Assistance with cooking/housework    Equipment Recommendations Rollator (4 wheels)  Recommendations for Other Services       Functional Status Assessment Patient has had a recent decline in their functional status and demonstrates the ability to make significant improvements in function in a reasonable and predictable amount of time.     Precautions / Restrictions Precautions Precautions: Fall Restrictions Weight Bearing Restrictions: No      Mobility  Bed Mobility               General bed mobility comments: OOB in chair    Transfers Overall transfer level: Needs assistance Equipment used: Rolling walker (2 wheels) Transfers: Sit to/from Stand Sit to Stand: Min guard           General transfer comment: Assist for safety. Uses rocking momentum to rise. Has lift chair at home    Ambulation/Gait Ambulation/Gait assistance: Supervision Gait Distance (Feet): 160 Feet (160' x 1, 60' x 1) Assistive device:  Rollator (4 wheels) Gait Pattern/deviations: Step-through pattern, Decreased stride length, Trunk flexed Gait velocity: adequate Gait velocity interpretation: >2.62 ft/sec, indicative of community ambulatory   General Gait Details: Assist for safety. Verbal instructions for locking/releasing brakes which pt was able to demonstrate and use appropriately  Stairs            Wheelchair Mobility    Modified Rankin (Stroke Patients Only)       Balance Overall balance assessment: Mild deficits observed, not formally tested                                           Pertinent Vitals/Pain Pain Assessment Pain Assessment: No/denies pain    Home Living Family/patient expects to be discharged to:: Private residence Living Arrangements: Spouse/significant other Available Help at Discharge: Family;Available 24 hours/day Type of Home: House Home Access: Level entry       Home Layout: One level Home Equipment: Conservation officer, nature (2 wheels);Cane - single point;Shower seat;Grab bars - toilet;Grab bars - tub/shower      Prior Function Prior Level of Function : Needs assist;Driving             Mobility Comments: amb without assistive device ADLs Comments: wife assists with socks     Hand Dominance   Dominant Hand: Left    Extremity/Trunk Assessment   Upper Extremity Assessment Upper Extremity Assessment: Defer to OT evaluation    Lower Extremity Assessment Lower Extremity Assessment:  Generalized weakness    Cervical / Trunk Assessment Cervical / Trunk Assessment: Normal  Communication   Communication: No difficulties;HOH  Cognition Arousal/Alertness: Awake/alert Behavior During Therapy: WFL for tasks assessed/performed Overall Cognitive Status: Within Functional Limits for tasks assessed                                          General Comments General comments (skin integrity, edema, etc.): Dyspnea 3/4 with activity. SpO2 90%  on RA with activity    Exercises     Assessment/Plan    PT Assessment Patient needs continued PT services  PT Problem List Decreased strength;Decreased activity tolerance;Decreased mobility       PT Treatment Interventions DME instruction;Gait training;Functional mobility training;Therapeutic activities;Therapeutic exercise;Balance training;Patient/family education    PT Goals (Current goals can be found in the Care Plan section)  Acute Rehab PT Goals Patient Stated Goal: go home PT Goal Formulation: With patient Time For Goal Achievement: 10/19/21 Potential to Achieve Goals: Good    Frequency Min 3X/week     Co-evaluation               AM-PAC PT "6 Clicks" Mobility  Outcome Measure Help needed turning from your back to your side while in a flat bed without using bedrails?: None Help needed moving from lying on your back to sitting on the side of a flat bed without using bedrails?: None Help needed moving to and from a bed to a chair (including a wheelchair)?: A Little Help needed standing up from a chair using your arms (e.g., wheelchair or bedside chair)?: A Little Help needed to walk in hospital room?: A Little Help needed climbing 3-5 steps with a railing? : A Little 6 Click Score: 20    End of Session Equipment Utilized During Treatment: Gait belt Activity Tolerance: Patient tolerated treatment well Patient left: in bed;with call bell/phone within reach;with bed alarm set;with family/visitor present Nurse Communication: Mobility status PT Visit Diagnosis: Other abnormalities of gait and mobility (R26.89)    Time: 1202-1230 PT Time Calculation (min) (ACUTE ONLY): 28 min   Charges:   PT Evaluation $PT Eval Moderate Complexity: 1 Mod PT Treatments $Gait Training: 8-22 mins        [place] Office [place] 10/05/2021, 1:39 PM

## 2021-10-05 NOTE — Progress Notes (Signed)
[name]  OTL:[id number] DOB: [birth year] DOA: 10/04/2021 PCP: [name], MD    Brief Narrative:  88 year old with a history of atrial fibrillation, CAD status post CABG, HTN, DM, and HLD who presented to the ER with severe generalized bodyaches and subjective fever.  He was seen in the ER 8/1 with neck pain and a L posterior neck swelling w/ [place] not suggestive of abscess without surrounding soft tissue infection.  He was treated with an empiric course of Augmentin, and discharged home.  On 8/6 his wife called EMS because the patient awoke with severe diffuse chills and generalized weakness.  In the ER 8/6 he was found to be hypoxic with presenting saturation 84% on room air.  CXR revealed a dense infiltrate in the right upper and right lower lobes.  She is  Consultants:  None  Goals of Care:  Code Status: Full Code   DVT prophylaxis: Eliquis  Interim Hx: Tachycardic with heart rate 106-136.  Blood pressure preserved.  Saturations 97% on 3 L nasal cannula.  Awake alert and quite conversant.  Denies significant shortness of breath fevers chills or chest pain at present.  Appears much improved.  Assessment & Plan:  Sepsis POA due to community-acquired pneumonia Patient met sepsis criteria with leukocytosis, fever, tachycardia, and suspected bacterial pulmonary infection -continue empiric antibiotics for pneumonia -sepsis physiology has essentially resolved at this time  Left posterior neck mass Suspect this is reactive lymphadenopathy from his pneumonia -monitor clinically -no acute findings visible on CT soft tissue of the neck  Chronic atrial fibrillation Continue his usual Eliquis and Toprol -resume Cardizem at low-dose and monitor -rate control improving with improvement in physiologic status  Mild hypokalemia Due to poor intake -supplement -check magnesium  CAD status post CABG Clinically stable -no chest pain/pressure  Ascending thoracic aortic aneurysm Measures 4.3  cm on CT this admission and felt to be stable from prior imaging  Normocytic anemia Likely due to acute illness as well as advanced age -check anemia panel  DM2 A1c 7.6 -CBG well-controlled at this time  HTN Blood pressure well-controlled at this time At HLD Continue Lipitor  BPH Continue Avodart and tamsulosin  Stage III CKD Creatinine stable  Obesity - Body mass index is 34.02 kg/m.   Family Communication: Spoke with patient daughter and wife at bedside at length Disposition: Anticipate patient will be stable to return home with his family in 2-3 days   Objective: Blood pressure 110/64, pulse (!) 111, temperature 98 F (36.7 C), temperature source Oral, resp. rate 20, height 5' 6" (1.676 m), weight 95.6 kg, SpO2 98 %.  Intake/Output Summary (Last 24 hours) at 10/05/2021 0855 Last data filed at 10/05/2021 0519 Gross per 24 hour  Intake 2613.03 ml  Output 400 ml  Net 2213.03 ml   Filed Weights   10/04/21 0411 10/04/21 1444  Weight: 90.7 kg 95.6 kg    Examination: General: No acute respiratory distress Lungs: Coarse crackles scattered throughout right fields with good air movement throughout left with no wheezing Cardiovascular: Irregularly irregular without murmur or rub Abdomen: Nontender, nondistended, soft, bowel sounds positive, no rebound, no ascites, no appreciable mass Extremities: No significant cyanosis, clubbing, or edema bilateral lower extremities  CBC: Recent Labs  Lab 10/04/21 0422 10/05/21 0342  WBC 11.2* 18.5*  NEUTROABS 10.0*  --   HGB 11.7* 9.7*  HCT 37.7* 31.7*  MCV 82.5 82.6  PLT 244 974   Basic Metabolic Panel: Recent Labs  Lab 10/04/21 0422 10/05/21 0342  NA 136 137  K 4.1 3.4*  CL 98 104  CO2 23 26  GLUCOSE 182* 111*  BUN 26* 20  CREATININE 1.47* 1.24  CALCIUM 9.1 8.7*   GFR: Estimated Creatinine Clearance: 42 mL/min (by C-G formula based on SCr of 1.24 mg/dL).  Liver Function Tests: Recent Labs  Lab 10/04/21 0422   AST 16  ALT 12  ALKPHOS 61  BILITOT 1.0  PROT 6.5  ALBUMIN 3.6     HbA1C: Hemoglobin A1C  Date/Time Value Ref Range Status  05/11/2021 10:27 AM 7.6 (A) 4.0 - 5.6 % Final  02/09/2021 09:31 AM 7.4 (A) 4.0 - 5.6 % Final  11/21/2019 12:00 AM 8.4  Final  02/09/2018 12:00 AM 9.0  Final   Hgb A1c MFr Bld  Date/Time Value Ref Range Status  05/26/2020 10:11 AM 8.1 (H) 4.6 - 6.5 % Final    Comment:    Glycemic Control Guidelines for People with Diabetes:Non Diabetic:  <6%Goal of Therapy: <7%Additional Action Suggested:  >8%      Scheduled Meds:  apixaban  5 mg Oral BID   docusate sodium  100 mg Oral BID   dutasteride  0.5 mg Oral Q M,W,F   fluticasone  1 spray Each Nare Daily   gabapentin  300 mg Oral TID   insulin aspart  0-15 Units Subcutaneous TID WC   insulin aspart  0-5 Units Subcutaneous QHS   insulin glargine-yfgn  40 Units Subcutaneous Daily   lidocaine  1 patch Transdermal Q24H   metoprolol succinate  100 mg Oral Daily   pantoprazole  40 mg Oral Daily   sodium chloride flush  3 mL Intravenous Q12H   tamsulosin  0.4 mg Oral QODAY   Continuous Infusions:  sodium chloride Stopped (10/05/21 0512)   azithromycin 250 mL/hr at 10/05/21 0519   cefTRIAXone (ROCEPHIN)  IV 2 g (10/05/21 0617)     LOS: 1 day   Cherene Altes, MD [facility] Office  [phone] Pager - Text Page per Shea Evans  If 7PM-7AM, please contact night-coverage per Amion 10/05/2021, 8:55 AM

## 2021-10-06 DIAGNOSIS — A419 Sepsis, unspecified organism: Secondary | ICD-10-CM | POA: Diagnosis not present

## 2021-10-06 DIAGNOSIS — J189 Pneumonia, unspecified organism: Secondary | ICD-10-CM | POA: Diagnosis not present

## 2021-10-06 LAB — CBC
HCT: 31.4 % — ABNORMAL LOW (ref 39.0–52.0)
Hemoglobin: 9.7 g/dL — ABNORMAL LOW (ref 13.0–17.0)
MCH: 25.1 pg — ABNORMAL LOW (ref 26.0–34.0)
MCHC: 30.9 g/dL (ref 30.0–36.0)
MCV: 81.3 fL (ref 80.0–100.0)
Platelets: 223 10*3/uL (ref 150–400)
RBC: 3.86 MIL/uL — ABNORMAL LOW (ref 4.22–5.81)
RDW: 16.6 % — ABNORMAL HIGH (ref 11.5–15.5)
WBC: 19.2 10*3/uL — ABNORMAL HIGH (ref 4.0–10.5)
nRBC: 0 % (ref 0.0–0.2)

## 2021-10-06 LAB — COMPREHENSIVE METABOLIC PANEL
ALT: 12 U/L (ref 0–44)
AST: 16 U/L (ref 15–41)
Albumin: 2.9 g/dL — ABNORMAL LOW (ref 3.5–5.0)
Alkaline Phosphatase: 57 U/L (ref 38–126)
Anion gap: 10 (ref 5–15)
BUN: 15 mg/dL (ref 8–23)
CO2: 22 mmol/L (ref 22–32)
Calcium: 8.9 mg/dL (ref 8.9–10.3)
Chloride: 106 mmol/L (ref 98–111)
Creatinine, Ser: 1.08 mg/dL (ref 0.61–1.24)
GFR, Estimated: >60 mL/min (ref >60)
Glucose, Bld: 76 mg/dL (ref 70–99)
Potassium: 3.8 mmol/L (ref 3.5–5.1)
Sodium: 138 mmol/L (ref 135–145)
Total Bilirubin: 1.1 mg/dL (ref 0.3–1.2)
Total Protein: 5.8 g/dL — ABNORMAL LOW (ref 6.5–8.1)

## 2021-10-06 LAB — IRON AND TIBC
Iron: 12 ug/dL — ABNORMAL LOW (ref 45–182)
Saturation Ratios: 4 % — ABNORMAL LOW (ref 17.9–39.5)
TIBC: 302 ug/dL (ref 250–450)
UIBC: 290 ug/dL

## 2021-10-06 LAB — RETICULOCYTES
Immature Retic Fract: 15.4 % (ref 2.3–15.9)
RBC.: 3.81 MIL/uL — ABNORMAL LOW (ref 4.22–5.81)
Retic Count, Absolute: 45 10*3/uL (ref 19.0–186.0)
Retic Ct Pct: 1.2 % (ref 0.4–3.1)

## 2021-10-06 LAB — GLUCOSE, CAPILLARY
Glucose-Capillary: 64 mg/dL — ABNORMAL LOW (ref 70–99)
Glucose-Capillary: 169 mg/dL — ABNORMAL HIGH (ref 70–99)
Glucose-Capillary: 174 mg/dL — ABNORMAL HIGH (ref 70–99)
Glucose-Capillary: 170 mg/dL — ABNORMAL HIGH (ref 70–99)

## 2021-10-06 LAB — MAGNESIUM: Magnesium: 1.9 mg/dL (ref 1.7–2.4)

## 2021-10-06 LAB — VITAMIN B12: Vitamin B-12: 414 pg/mL (ref 180–914)

## 2021-10-06 LAB — FOLATE: Folate: 18.7 ng/mL (ref >5.9)

## 2021-10-06 LAB — FERRITIN: Ferritin: 89 ng/mL (ref 24–336)

## 2021-10-06 MED ORDER — CYANOCOBALAMIN 1000 MCG/ML IJ SOLN
1000.0000 ug | Freq: Once | INTRAMUSCULAR | Status: AC
  Administered 2021-10-06: 1000 ug via SUBCUTANEOUS
  Filled 2021-10-06: qty 1

## 2021-10-06 MED ORDER — INSULIN GLARGINE-YFGN 100 UNIT/ML ~~LOC~~ SOLN
34.0000 [IU] | Freq: Every day | SUBCUTANEOUS | Status: DC
  Administered 2021-10-06 – 2021-10-07 (×2): 34 [IU] via SUBCUTANEOUS
  Filled 2021-10-06 (×2): qty 0.34

## 2021-10-06 MED ORDER — AZITHROMYCIN 250 MG PO TABS
500.0000 mg | ORAL_TABLET | Freq: Every day | ORAL | Status: DC
  Administered 2021-10-07: 500 mg via ORAL
  Filled 2021-10-06: qty 2

## 2021-10-06 MED ORDER — CEFDINIR 300 MG PO CAPS
300.0000 mg | ORAL_CAPSULE | Freq: Two times a day (BID) | ORAL | Status: DC
  Administered 2021-10-07: 300 mg via ORAL
  Filled 2021-10-06 (×2): qty 1

## 2021-10-06 NOTE — Inpatient Diabetes Management (Signed)
Inpatient Diabetes Program Recommendations  AACE/ADA: New Consensus Statement on Inpatient Glycemic Control   Target Ranges:  Prepandial:   less than 140 mg/dL      Peak postprandial:   less than 180 mg/dL (1-2 hours)      Critically ill patients:  140 - 180 mg/dL    Latest Reference Range & Units 10/05/21 08:09 10/05/21 13:06 10/05/21 16:53 10/05/21 21:11 10/06/21 07:46  Glucose-Capillary 70 - 99 mg/dL 92 157 (H) 98 106 (H) 64 (L)   Review of Glycemic Control  Diabetes history: DM2 Outpatient Diabetes medications: Lantus 40 units daily, Jardiance 25 mg  QAM, Metformin 500 mg BID Current orders for Inpatient glycemic control: Semglee 40 units daily, Novolog 0-15 units TID with meals, Novolog 0-5 units QHS  Inpatient Diabetes Program Recommendations:    Insulin: Fasting glucose 64 mg/dl today. Please consider decreasing Semglee to 35 units daily.  Thanks, Barnie Alderman, RN, MSN, [place] Diabetes Coordinator Inpatient Diabetes Program [phone] (Team Pager from 8am to [place])

## 2021-10-06 NOTE — Progress Notes (Signed)
Mobility Specialist Progress Note    10/06/21 1455  Mobility  Activity Ambulated with assistance in hallway  Level of Assistance Contact guard assist, steadying assist  Assistive Device Four wheel walker  Distance Ambulated (ft) 280 ft  Activity Response Tolerated well  $Mobility charge 1 Mobility   Pre-Mobility: 90% SpO2 During Mobility: 96% SpO2 Post-Mobility: 88 HR, 100% SpO2  Pt received in chair and agreeable. No complaints on walk. Maintained SpO2 >/=90% on RA. Returned to chair with call bell in reach.    Hildred Alamin Mobility Specialist

## 2021-10-06 NOTE — Progress Notes (Addendum)
[name]  ASN:[id number] DOB: [birth year] DOA: 10/04/2021 PCP: [name], MD    Brief Narrative:  88 year old with a history of atrial fibrillation, CAD status post CABG, HTN, DM, and HLD who presented to the ER with severe generalized bodyaches and subjective fever.  He was seen in the ER 8/1 with neck pain and a L posterior neck swelling w/ [place] not suggestive of abscess without surrounding soft tissue infection.  He was treated with an empiric course of Augmentin, and discharged home.  On 8/6 his wife called EMS because the patient awoke with severe diffuse chills and generalized weakness.  In the ER 8/6 he was found to be hypoxic with presenting saturation 84% on room air.  CXR revealed a dense infiltrate in the right upper and right lower lobes.  She is  Consultants:  None  Goals of Care:  Code Status: Full Code   DVT prophylaxis: Eliquis  Interim Hx: Tachycardia essentially resolved.  Afebrile.  Blood pressure stable.  Saturation is 100% on 2 L nasal cannula support.  Resting comfortably in bed.  Tells me he feels great.  Is inquiring about when he can go home.  Denies fevers or chills.  No shortness of breath.  Neck pain resolved.  Assessment & Plan:  Sepsis POA due to community-acquired pneumonia Patient met sepsis criteria with leukocytosis, fever, tachycardia, and bacterial pulmonary infection -continue empiric antibiotics for pneumonia with plan to transition to oral antibiotics at time of discharge - sepsis physiology has resolved with treatment - wean to room air  Left posterior neck mass Suspect this was reactive lymphadenopathy from his pneumonia -monitor clinically in outpatient follow-up - no acute findings visible on CT soft tissue of the neck  Chronic atrial fibrillation Continue his usual Eliquis and Toprol - resumed Cardizem - rate control improved with improvement in physiologic status  Stage III CKD Review of records notes baseline creatinine  approximately 1.25-1.33 over last year - creatinine 1.47 at presentation therefore not meeting criteria for AKI -renal function stable  Mild hypokalemia Due to poor intake -corrected with supplementation  CAD status post CABG Clinically stable -no chest pain/pressure  Ascending thoracic aortic aneurysm Measures 4.3 cm on CT this admission and felt to be stable from prior imaging  Normocytic anemia Likely due to acute illness as well as advanced age -anemia panel suggests probable combination of poor nutrition as well as iron deficiency with low iron but also low TIBC -B12 is also borderline therefore have dosed with subcu B12 x 1  DM2 A1c 7.6 -some mild hypoglycemia this morning therefore insulin dose decreased  HTN Blood pressure well-controlled at this time  HLD Continue Lipitor  BPH Continue Avodart and tamsulosin  Obesity - Body mass index is 34.02 kg/m.  Family Communication: Spoke with patient and wife at bedside Disposition: Anticipate patient will be stable to return home with his family by 8/9 if he he can be weaned from oxygen and he otherwise clinically improves   Objective: Blood pressure 128/62, pulse 97, temperature 98.1 F (36.7 C), temperature source Oral, resp. rate 20, height 5' 6"  (1.676 m), weight 95.6 kg, SpO2 100 %.  Intake/Output Summary (Last 24 hours) at 10/06/2021 0955 Last data filed at 10/05/2021 1800 Gross per 24 hour  Intake 1379.48 ml  Output --  Net 1379.48 ml    Filed Weights   10/04/21 0411 10/04/21 1444  Weight: 90.7 kg 95.6 kg    Examination: General: No acute respiratory distress Lungs: Fine crackles scattered  throughout right fields with clear breath sounds on left Cardiovascular: Irregularly irregular without murmur or rub Abdomen: NT/ND, soft, BS positive Extremities: No significant cyanosis, clubbing, or edema bilateral lower extremities  CBC: Recent Labs  Lab 10/04/21 0422 10/05/21 0342 10/06/21 0425  WBC 11.2* 18.5*  19.2*  NEUTROABS 10.0*  --   --   HGB 11.7* 9.7* 9.7*  HCT 37.7* 31.7* 31.4*  MCV 82.5 82.6 81.3  PLT 244 212 184    Basic Metabolic Panel: Recent Labs  Lab 10/04/21 0422 10/05/21 0342 10/06/21 0425  NA 136 137 138  K 4.1 3.4* 3.8  CL 98 104 106  CO2 23 26 22   GLUCOSE 182* 111* 76  BUN 26* 20 15  CREATININE 1.47* 1.24 1.08  CALCIUM 9.1 8.7* 8.9  MG  --   --  1.9    GFR: Estimated Creatinine Clearance: 48.2 mL/min (by C-G formula based on SCr of 1.08 mg/dL).  Liver Function Tests: Recent Labs  Lab 10/04/21 0422 10/06/21 0425  AST 16 16  ALT 12 12  ALKPHOS 61 57  BILITOT 1.0 1.1  PROT 6.5 5.8*  ALBUMIN 3.6 2.9*    Scheduled Meds:  apixaban  5 mg Oral BID   [START ON 10/07/2021] azithromycin  500 mg Oral Daily   diltiazem  360 mg Oral Daily   docusate sodium  100 mg Oral BID   dutasteride  0.5 mg Oral Q M,W,F   fluticasone  1 spray Each Nare Daily   gabapentin  300 mg Oral TID   insulin aspart  0-15 Units Subcutaneous TID WC   insulin aspart  0-5 Units Subcutaneous QHS   insulin glargine-yfgn  34 Units Subcutaneous Daily   lidocaine  1 patch Transdermal Q24H   metoprolol succinate  100 mg Oral Daily   pantoprazole  40 mg Oral Daily   sodium chloride flush  3 mL Intravenous Q12H   tamsulosin  0.4 mg Oral QODAY   Continuous Infusions:  cefTRIAXone (ROCEPHIN)  IV 2 g (10/06/21 0375)     LOS: 2 days   Cherene Altes, MD [facility] Office  [phone] Pager - Text Page per Shea Evans  If 7PM-7AM, please contact night-coverage per Amion 10/06/2021, 9:55 AM

## 2021-10-07 ENCOUNTER — Ambulatory Visit: Payer: Medicare Other | Admitting: Family Medicine

## 2021-10-07 ENCOUNTER — Other Ambulatory Visit (HOSPITAL_COMMUNITY): Payer: Self-pay

## 2021-10-07 ENCOUNTER — Telehealth: Payer: Self-pay | Admitting: Family Medicine

## 2021-10-07 DIAGNOSIS — I4891 Unspecified atrial fibrillation: Secondary | ICD-10-CM | POA: Diagnosis not present

## 2021-10-07 DIAGNOSIS — A419 Sepsis, unspecified organism: Secondary | ICD-10-CM | POA: Diagnosis not present

## 2021-10-07 DIAGNOSIS — I482 Chronic atrial fibrillation, unspecified: Secondary | ICD-10-CM | POA: Diagnosis not present

## 2021-10-07 DIAGNOSIS — J189 Pneumonia, unspecified organism: Secondary | ICD-10-CM | POA: Diagnosis not present

## 2021-10-07 DIAGNOSIS — I10 Essential (primary) hypertension: Secondary | ICD-10-CM

## 2021-10-07 LAB — CBC
HCT: 29.7 % — ABNORMAL LOW (ref 39.0–52.0)
Hemoglobin: 9.2 g/dL — ABNORMAL LOW (ref 13.0–17.0)
MCH: 25.1 pg — ABNORMAL LOW (ref 26.0–34.0)
MCHC: 31 g/dL (ref 30.0–36.0)
MCV: 81.1 fL (ref 80.0–100.0)
Platelets: 225 10*3/uL (ref 150–400)
RBC: 3.66 MIL/uL — ABNORMAL LOW (ref 4.22–5.81)
RDW: 16.5 % — ABNORMAL HIGH (ref 11.5–15.5)
WBC: 15.8 10*3/uL — ABNORMAL HIGH (ref 4.0–10.5)
nRBC: 0 % (ref 0.0–0.2)

## 2021-10-07 LAB — GLUCOSE, CAPILLARY: Glucose-Capillary: 180 mg/dL — ABNORMAL HIGH (ref 70–99)

## 2021-10-07 MED ORDER — INSULIN GLARGINE-YFGN 100 UNIT/ML ~~LOC~~ SOLN
15.0000 [IU] | Freq: Two times a day (BID) | SUBCUTANEOUS | Status: DC
  Filled 2021-10-07: qty 0.15

## 2021-10-07 MED ORDER — AZITHROMYCIN 250 MG PO TABS
ORAL_TABLET | ORAL | 0 refills | Status: DC
  Filled 2021-10-07: qty 2, 2d supply, fill #0

## 2021-10-07 MED ORDER — INSULIN ASPART 100 UNIT/ML IJ SOLN: 3.0000 [IU] | Freq: Three times a day (TID) | INTRAMUSCULAR | Status: DC

## 2021-10-07 MED ORDER — INSULIN ASPART 100 UNIT/ML IJ SOLN: 0.0000 [IU] | Freq: Three times a day (TID) | INTRAMUSCULAR | Status: DC

## 2021-10-07 MED ORDER — CEFDINIR 300 MG PO CAPS
300.0000 mg | ORAL_CAPSULE | Freq: Two times a day (BID) | ORAL | 0 refills | Status: AC
Start: 2021-10-07 — End: 2021-10-09
  Filled 2021-10-07: qty 4, 2d supply, fill #0

## 2021-10-07 MED ORDER — INSULIN ASPART 100 UNIT/ML IJ SOLN: 0.0000 [IU] | Freq: Every day | INTRAMUSCULAR | Status: DC

## 2021-10-07 NOTE — Discharge Summary (Signed)
Physician Discharge Summary  [name] QPY:[id number] DOB: 05/09/1928 DOA: 10/04/2021  PCP: [name], MD  Admit date: 10/04/2021 Discharge date: 10/07/2021  Admitted From: Home Disposition:  Home  Recommendations for Outpatient Follow-up:  Follow up with PCP in 1-2 weeks Please obtain BMP/CBC in one week   Home Health:No Equipment/Devices:None  Discharge Condition:Stable CODE STATUS:Full Diet recommendation: Heart Healthy   Brief/Interim Summary: 88 y.o. male past medical history of atrial fibrillation, CAD status post CABG, essential hypertension diabetes mellitus comes into the ED with severe generalized body ache and subjective fevers came into the ED on 09/29/2021 was treated empirically with a course of Augmentin and discharged home on 10/04/2021 EMS was called as he was experiencing severe chills and generalized weakness, found hypoxic in the ED chest x-ray revealed right upper and lower lobe infiltrate  Discharge Diagnoses:  Principal Problem:   Sepsis due to pneumonia ([place]) Active Problems:   Hx of CABG x 2 1990   Essential (primary) hypertension   Diabetes mellitus with diabetic nephropathy, with long-term current use of insulin ([place])   Mixed hyperlipidemia   Chronic atrial fibrillation ([place])   Enlarged prostate without lower urinary tract symptoms (l[place])   Obesity (BMI 30.0-34.9)   Stage 3b chronic kidney disease ([place])   Neck mass  Sepsis due to pneumonia present on admission: He was started empirically on Rocephin and azithromycin he defervesced leukocytosis improved. He was transition to oral antibiotics with and he will finish 7-day as an outpatient.  Left posterior neck mass: Likely lymphadenopathy in the setting of pneumonia. CT of the head and neck showed no visible findings.  Chronic atrial fibrillation: Rate controlled on Eliquis metoprolol and Cardizem no changes made.  Chronic kidney disease stage IIIa: Noted.  Hypokalemia: Likely due to  poor oral intake replete orally now resolved.  CAD with history of CABG: He remained asymptomatic.  Ascending thoracic aneurysm: Stable compared to prior previous studies.  Normocytic anemia: Likely due to acute illness, B12 was borderline low it was repleted subcutaneously.  Diabetes mellitus type 2: With an A1c of 7.6: His oral hypoglycemic agents were held on admission he was covered with sliding scale no changes made to his regimen he will continue with as an outpatient.  Essential hypertension: Controlled continue current regimen.  Hyperlipidemia: Continue statins.  BPH: Continue Avodart and Flomax no changes made.    Discharge Instructions  Discharge Instructions     Diet - low sodium heart healthy   Complete by: As directed    Increase activity slowly   Complete by: As directed       Allergies as of 10/07/2021       Reactions   Septra [sulfamethoxazole-trimethoprim] Itching   Cefadroxil Other (See Comments)   Unknown reaction    Ciprofloxacin Other (See Comments)   Unknown action    Erythromycin Other (See Comments)   Upset stomach        Medication List     STOP taking these medications    amoxicillin-clavulanate 875-125 MG tablet Commonly known as: AUGMENTIN   oxyCODONE-acetaminophen 10-325 MG tablet Commonly known as: PERCOCET       TAKE these medications    apixaban 5 MG Tabs tablet Commonly known as: ELIQUIS Take 1 tablet (5 mg total) by mouth 2 (two) times daily.   atorvastatin 40 MG tablet Commonly known as: LIPITOR Take 1 tablet (40 mg total) by mouth daily at 6 PM.   Avodart 0.5 MG capsule Generic drug: dutasteride Take 0.5 mg by  mouth every Monday, Wednesday, and Friday.   azithromycin 250 MG tablet Commonly known as: ZITHROMAX Take 1 tab daily Start taking on: October 08, 2021   cefdinir 300 MG capsule Commonly known as: OMNICEF Take 1 capsule (300 mg total) by mouth every 12 (twelve) hours for 2 days.   diltiazem  360 MG 24 hr capsule Commonly known as: TIAZAC Take 1 capsule by mouth daily.   empagliflozin 25 MG Tabs tablet Commonly known as: JARDIANCE Take 1 tablet (25 mg total) by mouth daily before breakfast.   fluticasone 50 MCG/ACT nasal spray Commonly known as: FLONASE SHAKE LIQUID AND USE 1 SPRAY IN EACH NOSTRIL DAILY What changed: See the new instructions.   furosemide 40 MG tablet Commonly known as: LASIX TAKE 1 TABLET(40 MG) BY MOUTH DAILY What changed:  how much to take how to take this when to take this additional instructions   gabapentin 300 MG capsule Commonly known as: NEURONTIN TAKE 1 CAPSULE(300 MG) BY MOUTH THREE TIMES DAILY What changed: See the new instructions.   insulin glargine 100 UNIT/ML injection Commonly known as: LANTUS Inject 40 Units into the skin every morning.   lidocaine 5 % Commonly known as: Lidoderm Place 1 patch onto the skin daily. Remove & Discard patch within 12 hours or as directed by MD   metFORMIN 500 MG tablet Commonly known as: GLUCOPHAGE Take 500 mg by mouth 2 (two) times daily with a meal.   metoprolol succinate 100 MG 24 hr tablet Commonly known as: TOPROL-XL Take 1 tablet (100 mg total) by mouth daily. Take with or immediately following a meal. What changed: additional instructions   nitroGLYCERIN 0.4 MG SL tablet Commonly known as: NITROSTAT Place 0.4 mg under the tongue every 5 (five) minutes as needed for chest pain (x 3 doses).   pantoprazole 40 MG tablet Commonly known as: PROTONIX TAKE 1 TABLET BY MOUTH DAILY AT NOON What changed: See the new instructions.   potassium chloride SA 20 MEQ tablet Commonly known as: KLOR-CON M TAKE 1 TABLET(20 MEQ) BY MOUTH DAILY What changed:  how much to take how to take this when to take this additional instructions   tamsulosin 0.4 MG Caps capsule Commonly known as: FLOMAX Take 0.4 mg by mouth every other day.        Allergies  Allergen Reactions   Septra  [Sulfamethoxazole-Trimethoprim] Itching   Cefadroxil Other (See Comments)    Unknown reaction    Ciprofloxacin Other (See Comments)    Unknown action     Erythromycin Other (See Comments)    Upset stomach     Consultations: None   Procedures/Studies: CT Chest W Contrast  Result Date: 10/04/2021 CLINICAL DATA:  Pneumonia.  Evaluate for complication. EXAM: CT CHEST WITH CONTRAST TECHNIQUE: Multidetector CT imaging of the chest was performed during intravenous contrast administration. RADIATION DOSE REDUCTION: This exam was performed according to the departmental dose-optimization program which includes automated exposure control, adjustment of the mA and/or kV according to patient size and/or use of iterative reconstruction technique. CONTRAST:  64m OMNIPAQUE IOHEXOL 300 MG/ML  SOLN COMPARISON:  02/04/2020 FINDINGS: Cardiovascular: Normal heart size. Previous median sternotomy and CABG procedure. Extensive aortic atherosclerosis. Ascending thoracic aorta measures 4.3 cm in diameter, unchanged from previous exam no pericardial effusion. Mediastinum/Nodes: Thyroid gland, trachea and esophagus are unremarkable. No axillary, mediastinal, or hilar adenopathy. Lungs/Pleura: No pleural effusion. Multifocal airspace disease is identified within the right upper lobe and right lower lobe compatible with multifocal pneumonia. Scarring is identified within  the posterior left lower lobe. Upper Abdomen: No acute abnormality. Small hiatal hernia. Gallstones. Nonobstructing stone within upper pole of right kidney measures 3 mm. Musculoskeletal: No chest wall abnormality. No acute or significant osseous findings. Multiple healed left lateral rib fracture deformities identified. IMPRESSION: 1. Multifocal airspace disease is identified within the right upper lobe and right lower lobe compatible with multifocal pneumonia. Followup PA and lateral chest X-ray is recommended in 3-4 weeks following trial of antibiotic  therapy to ensure resolution and exclude underlying malignancy. 2. Gallstones. 3. Nonobstructing right renal calculus. 4. Aortic Atherosclerosis (ICD10-I70.0). 5. Stable ascending thoracic aortic aneurysm measuring 4.3 cm. Recommend annual imaging followup by CTA or MRA. This recommendation follows 2010 ACCF/AHA/AATS/ACR/ASA/SCA/SCAI/SIR/STS/SVM Guidelines for the Diagnosis and Management of Patients with Thoracic Aortic Disease. Circulation. 2010; 121: P379-K240. Aortic aneurysm NOS (ICD10-I71.9) Electronically Signed   By: Kerby Moors M.D.   On: 10/04/2021 07:40   CT HEAD WO CONTRAST (5MM)  Result Date: 10/04/2021 CLINICAL DATA:  Mental status change with unknown cause EXAM: CT HEAD WITHOUT CONTRAST TECHNIQUE: Contiguous axial images were obtained from the base of the skull through the vertex without intravenous contrast. RADIATION DOSE REDUCTION: This exam was performed according to the departmental dose-optimization program which includes automated exposure control, adjustment of the mA and/or kV according to patient size and/or use of iterative reconstruction technique. COMPARISON:  Brain MRI 06/06/2016 FINDINGS: Brain: No evidence of acute infarction, hemorrhage, hydrocephalus, extra-axial collection or mass lesion/mass effect. Generalized brain atrophy. Vascular: Scattered atheromatous calcification Skull: Normal. Negative for fracture or focal lesion. Sinuses/Orbits: No acute finding. IMPRESSION: No acute finding. Electronically Signed   By: Jorje Guild M.D.   On: 10/04/2021 07:38   CT Soft Tissue Neck W Contrast  Result Date: 10/04/2021 CLINICAL DATA:  Neck mass, nonpulsatile.  Sepsis. EXAM: CT NECK WITH CONTRAST TECHNIQUE: Multidetector CT imaging of the neck was performed using the standard protocol following the bolus administration of intravenous contrast. RADIATION DOSE REDUCTION: This exam was performed according to the departmental dose-optimization program which includes automated  exposure control, adjustment of the mA and/or kV according to patient size and/or use of iterative reconstruction technique. CONTRAST:  37m OMNIPAQUE IOHEXOL 300 MG/ML  SOLN COMPARISON:  None similar FINDINGS: Pharynx and larynx: No evidence of mass or inflammation. Salivary glands: No inflammation, mass, or stone. Thyroid: Unremarkable Lymph nodes: None enlarged or heterogeneous Vascular: Extensive atheromatous calcification. Major vessels are enhancing. Limited intracranial: No acute finding Visualized orbits: Bilateral cataract resection Mastoids and visualized paranasal sinuses: Clear Skeleton: Generalized cervical spine degeneration. No acute or aggressive finding. Upper chest: Extensive airspace disease in the right lung. IMPRESSION: 1. No acute finding in the neck. 2. Partial coverage of extensive right upper lobe pneumonia. Electronically Signed   By: JJorje GuildM.D.   On: 10/04/2021 07:20   DG Chest Portable 1 View  Result Date: 10/04/2021 CLINICAL DATA:  Cough.  Altered mental status. EXAM: PORTABLE CHEST 1 VIEW COMPARISON:  07/03/2020 FINDINGS: Previous median sternotomy and CABG procedure. Aortic atherosclerosis. Extent to a shin of the cardiac silhouette may reflect cardiomegaly. Small right pleural effusion. Large area of airspace consolidation is identified within the right upper and right lower lung. Left lung appears clear. Visualized osseous structures are unremarkable. IMPRESSION: Large area of airspace consolidation in the right upper and right lower lung compatible with pneumonia. Followup PA and lateral chest X-ray is recommended in 3-4 weeks following trial of antibiotic therapy to ensure resolution and exclude underlying malignancy. Small right pleural effusion.  Electronically Signed   By: Kerby Moors M.D.   On: 10/04/2021 05:35   (Echo, Carotid, EGD, Colonoscopy, ERCP)    Subjective: No complaints  Discharge Exam: Vitals:   10/07/21 0457 10/07/21 0818  BP: 127/61 (!)  148/73  Pulse: (!) 102 (!) 118  Resp: 19 18  Temp: 97.8 F (36.6 C) 97.6 F (36.4 C)  SpO2: 100% 99%   Vitals:   10/06/21 2047 10/06/21 2050 10/07/21 0457 10/07/21 0818  BP: 139/89  127/61 (!) 148/73  Pulse: (!) 120 90 (!) 102 (!) 118  Resp: '18  19 18  '$ Temp: 97.7 F (36.5 C)  97.8 F (36.6 C) 97.6 F (36.4 C)  TempSrc: Oral  Oral Oral  SpO2: 99% 98% 100% 99%  Weight:      Height:        General: Pt is alert, awake, not in acute distress Cardiovascular: RRR, S1/S2 +, no rubs, no gallops Respiratory: CTA bilaterally, no wheezing, no rhonchi Abdominal: Soft, NT, ND, bowel sounds + Extremities: no edema, no cyanosis    The results of significant diagnostics from this hospitalization (including imaging, microbiology, ancillary and laboratory) are listed below for reference.     Microbiology: Recent Results (from the past 240 hour(s))  Culture, blood (Routine x 2)     Status: None (Preliminary result)   Collection Time: 10/04/21  4:22 AM   Specimen: BLOOD RIGHT HAND  Result Value Ref Range Status   Specimen Description BLOOD RIGHT HAND  Final   Special Requests   Final    BOTTLES DRAWN AEROBIC AND ANAEROBIC Blood Culture adequate volume   Culture   Final    NO GROWTH 3 DAYS Performed at [facility], [address] [place], [place] [address]    Report Status PENDING  Incomplete  Resp Panel by RT-PCR (Flu A&B, Covid) Anterior Nasal Swab     Status: None   Collection Time: 10/04/21  4:25 AM   Specimen: Anterior Nasal Swab  Result Value Ref Range Status   SARS Coronavirus 2 by RT PCR NEGATIVE NEGATIVE Final    Comment: (NOTE) SARS-CoV-2 target nucleic acids are NOT DETECTED.  The SARS-CoV-2 RNA is generally detectable in upper respiratory specimens during the acute phase of infection. The lowest concentration of SARS-CoV-2 viral copies this assay can detect is 138 copies/mL. A negative result does not preclude SARS-Cov-2 infection and should not be used as  the sole basis for treatment or other patient management decisions. A negative result may occur with  improper specimen collection/handling, submission of specimen other than nasopharyngeal swab, presence of viral mutation(s) within the areas targeted by this assay, and inadequate number of viral copies(<138 copies/mL). A negative result must be combined with clinical observations, patient history, and epidemiological information. The expected result is Negative.  Fact Sheet for Patients:  EntrepreneurPulse.com.au  Fact Sheet for Healthcare Providers:  IncredibleEmployment.be  This test is no t yet approved or cleared by the [place] FDA and  has been authorized for detection and/or diagnosis of SARS-CoV-2 by FDA under an Emergency Use Authorization (EUA). This EUA will remain  in effect (meaning this test can be used) for the duration of the COVID-19 declaration under Section 564(b)(1) of the Act, 21 U.S.C.section 360bbb-3(b)(1), unless the authorization is terminated  or revoked sooner.       Influenza A by PCR NEGATIVE NEGATIVE Final   Influenza B by PCR NEGATIVE NEGATIVE Final    Comment: (NOTE) The Xpert Xpress SARS-CoV-2/FLU/RSV plus assay  is intended as an aid in the diagnosis of influenza from Nasopharyngeal swab specimens and should not be used as a sole basis for treatment. Nasal washings and aspirates are unacceptable for Xpert Xpress SARS-CoV-2/FLU/RSV testing.  Fact Sheet for Patients: EntrepreneurPulse.com.au  Fact Sheet for Healthcare Providers: IncredibleEmployment.be  This test is not yet approved or cleared by the [place] FDA and has been authorized for detection and/or diagnosis of SARS-CoV-2 by FDA under an Emergency Use Authorization (EUA). This EUA will remain in effect (meaning this test can be used) for the duration of the COVID-19 declaration under Section 564(b)(1) of  the Act, 21 U.S.C. section 360bbb-3(b)(1), unless the authorization is terminated or revoked.  Performed at [facility], [place] [address], [place], [place] [address]   Culture, blood (Routine x 2)     Status: None (Preliminary result)   Collection Time: 10/04/21  4:35 AM   Specimen: BLOOD  Result Value Ref Range Status   Specimen Description BLOOD SITE NOT SPECIFIED  Final   Special Requests   Final    BOTTLES DRAWN AEROBIC AND ANAEROBIC Blood Culture adequate volume   Culture   Final    NO GROWTH 3 DAYS Performed at [facility], [address] [place], [place] [address]    Report Status PENDING  Incomplete     Labs: BNP (last 3 results) No results for input(s): "BNP" in the last 8760 hours. Basic Metabolic Panel: Recent Labs  Lab 10/04/21 0422 10/05/21 0342 10/06/21 0425  NA 136 137 138  K 4.1 3.4* 3.8  CL 98 104 106  CO2 '23 26 22  '$ GLUCOSE 182* 111* 76  BUN 26* 20 15  CREATININE 1.47* 1.24 1.08  CALCIUM 9.1 8.7* 8.9  MG  --   --  1.9   Liver Function Tests: Recent Labs  Lab 10/04/21 0422 10/06/21 0425  AST 16 16  ALT 12 12  ALKPHOS 61 57  BILITOT 1.0 1.1  PROT 6.5 5.8*  ALBUMIN 3.6 2.9*   No results for input(s): "LIPASE", "AMYLASE" in the last 168 hours. No results for input(s): "AMMONIA" in the last 168 hours. CBC: Recent Labs  Lab 10/04/21 0422 10/05/21 0342 10/06/21 0425 10/07/21 0314  WBC 11.2* 18.5* 19.2* 15.8*  NEUTROABS 10.0*  --   --   --   HGB 11.7* 9.7* 9.7* 9.2*  HCT 37.7* 31.7* 31.4* 29.7*  MCV 82.5 82.6 81.3 81.1  PLT 244 212 223 225   Cardiac Enzymes: No results for input(s): "CKTOTAL", "CKMB", "CKMBINDEX", "TROPONINI" in the last 168 hours. BNP: Invalid input(s): "POCBNP" CBG: Recent Labs  Lab 10/06/21 0746 10/06/21 1235 10/06/21 1631 10/06/21 2132 10/07/21 0815  GLUCAP 64* 169* 174* 170* 180*   D-Dimer No results for input(s): "DDIMER" in the last 72 hours. Hgb A1c No results for input(s): "HGBA1C" in  the last 72 hours. Lipid Profile No results for input(s): "CHOL", "HDL", "LDLCALC", "TRIG", "CHOLHDL", "LDLDIRECT" in the last 72 hours. Thyroid function studies No results for input(s): "TSH", "T4TOTAL", "T3FREE", "THYROIDAB" in the last 72 hours.  Invalid input(s): "FREET3" Anemia work up Recent Labs    10/06/21 0425  VITAMINB12 414  FOLATE 18.7  FERRITIN 89  TIBC 302  IRON 12*  RETICCTPCT 1.2   Urinalysis    Component Value Date/Time   COLORURINE YELLOW 10/04/2021 2228   APPEARANCEUR CLEAR 10/04/2021 2228   LABSPEC 1.024 10/04/2021 2228   PHURINE 5.0 10/04/2021 2228   GLUCOSEU >=500 (A) 10/04/2021 2228  GLUCOSEU >=1000 (A) 06/25/2020 1152   HGBUR NEGATIVE 10/04/2021 2228   BILIRUBINUR NEGATIVE 10/04/2021 2228   BILIRUBINUR negative 07/03/2020 0949   KETONESUR NEGATIVE 10/04/2021 2228   PROTEINUR NEGATIVE 10/04/2021 2228   UROBILINOGEN 0.2 07/03/2020 0949   UROBILINOGEN 0.2 06/25/2020 1152   NITRITE NEGATIVE 10/04/2021 2228   LEUKOCYTESUR NEGATIVE 10/04/2021 2228   Sepsis Labs Recent Labs  Lab 10/04/21 0422 10/05/21 0342 10/06/21 0425 10/07/21 0314  WBC 11.2* 18.5* 19.2* 15.8*   Microbiology Recent Results (from the past 240 hour(s))  Culture, blood (Routine x 2)     Status: None (Preliminary result)   Collection Time: 10/04/21  4:22 AM   Specimen: BLOOD RIGHT HAND  Result Value Ref Range Status   Specimen Description BLOOD RIGHT HAND  Final   Special Requests   Final    BOTTLES DRAWN AEROBIC AND ANAEROBIC Blood Culture adequate volume   Culture   Final    NO GROWTH 3 DAYS Performed at [facility], [address], [place], [place] [address]    Report Status PENDING  Incomplete  Resp Panel by RT-PCR (Flu A&B, Covid) Anterior Nasal Swab     Status: None   Collection Time: 10/04/21  4:25 AM   Specimen: Anterior Nasal Swab  Result Value Ref Range Status   SARS Coronavirus 2 by RT PCR NEGATIVE NEGATIVE Final    Comment: (NOTE) SARS-CoV-2 target  nucleic acids are NOT DETECTED.  The SARS-CoV-2 RNA is generally detectable in upper respiratory specimens during the acute phase of infection. The lowest concentration of SARS-CoV-2 viral copies this assay can detect is 138 copies/mL. A negative result does not preclude SARS-Cov-2 infection and should not be used as the sole basis for treatment or other patient management decisions. A negative result may occur with  improper specimen collection/handling, submission of specimen other than nasopharyngeal swab, presence of viral mutation(s) within the areas targeted by this assay, and inadequate number of viral copies(<138 copies/mL). A negative result must be combined with clinical observations, patient history, and epidemiological information. The expected result is Negative.  Fact Sheet for Patients:  EntrepreneurPulse.com.au  Fact Sheet for Healthcare Providers:  IncredibleEmployment.be  This test is no t yet approved or cleared by the [place] FDA and  has been authorized for detection and/or diagnosis of SARS-CoV-2 by FDA under an Emergency Use Authorization (EUA). This EUA will remain  in effect (meaning this test can be used) for the duration of the COVID-19 declaration under Section 564(b)(1) of the Act, 21 U.S.C.section 360bbb-3(b)(1), unless the authorization is terminated  or revoked sooner.       Influenza A by PCR NEGATIVE NEGATIVE Final   Influenza B by PCR NEGATIVE NEGATIVE Final    Comment: (NOTE) The Xpert Xpress SARS-CoV-2/FLU/RSV plus assay is intended as an aid in the diagnosis of influenza from Nasopharyngeal swab specimens and should not be used as a sole basis for treatment. Nasal washings and aspirates are unacceptable for Xpert Xpress SARS-CoV-2/FLU/RSV testing.  Fact Sheet for Patients: EntrepreneurPulse.com.au  Fact Sheet for Healthcare  Providers: IncredibleEmployment.be  This test is not yet approved or cleared by the [place] FDA and has been authorized for detection and/or diagnosis of SARS-CoV-2 by FDA under an Emergency Use Authorization (EUA). This EUA will remain in effect (meaning this test can be used) for the duration of the COVID-19 declaration under Section 564(b)(1) of the Act, 21 U.S.C. section 360bbb-3(b)(1), unless the authorization is terminated or revoked.  Performed at [facility], [place] [address] [place], [place] [address]   Culture, blood (Routine x 2)     Status: None (Preliminary result)   Collection Time: 10/04/21  4:35 AM   Specimen: BLOOD  Result Value Ref Range Status   Specimen Description BLOOD SITE NOT SPECIFIED  Final   Special Requests   Final    BOTTLES DRAWN AEROBIC AND ANAEROBIC Blood Culture adequate volume   Culture   Final    NO GROWTH 3 DAYS Performed at [facility], [address], [place], [place] [address]    Report Status PENDING  Incomplete     SIGNED:   Charlynne Cousins, MD  [facility] 10/07/2021, 10:10 AM Pager   If 7PM-7AM, please contact night-coverage [email or web address] Password [email or web address]

## 2021-10-07 NOTE — Telephone Encounter (Signed)
Patient's wife Lelon Frohlich requests to be called at ph# [phone] re: Patient was just released from [facility] and was told to call PCP before taking Kzithromycin tablets  AND Cefdinir capsules to make sure Patient is not allergic to them and they are safe for Patient to take.  Ann confirms Patient has appointment with Dr. Jonni Sanger 10/13/21.

## 2021-10-07 NOTE — Plan of Care (Signed)
  Problem: Activity: Goal: Ability to tolerate increased activity will improve Outcome: Adequate for Discharge   Problem: Clinical Measurements: Goal: Ability to maintain a body temperature in the normal range will improve Outcome: Adequate for Discharge   Problem: Respiratory: Goal: Ability to maintain adequate ventilation will improve Outcome: Adequate for Discharge Goal: Ability to maintain a clear airway will improve Outcome: Adequate for Discharge   Problem: Education: Goal: Knowledge of General Education information will improve Description: Including pain rating scale, medication(s)/side effects and non-pharmacologic comfort measures Outcome: Adequate for Discharge   Problem: Health Behavior/Discharge Planning: Goal: Ability to manage health-related needs will improve Outcome: Adequate for Discharge   Problem: Clinical Measurements: Goal: Ability to maintain clinical measurements within normal limits will improve Outcome: Adequate for Discharge Goal: Will remain free from infection Outcome: Adequate for Discharge Goal: Diagnostic test results will improve Outcome: Adequate for Discharge Goal: Respiratory complications will improve Outcome: Adequate for Discharge Goal: Cardiovascular complication will be avoided Outcome: Adequate for Discharge   Problem: Activity: Goal: Risk for activity intolerance will decrease Outcome: Adequate for Discharge   Problem: Nutrition: Goal: Adequate nutrition will be maintained Outcome: Adequate for Discharge   Problem: Coping: Goal: Level of anxiety will decrease Outcome: Adequate for Discharge   Problem: Elimination: Goal: Will not experience complications related to bowel motility Outcome: Adequate for Discharge Goal: Will not experience complications related to urinary retention Outcome: Adequate for Discharge   Problem: Pain Managment: Goal: General experience of comfort will improve Outcome: Adequate for Discharge    Problem: Safety: Goal: Ability to remain free from injury will improve Outcome: Adequate for Discharge   Problem: Skin Integrity: Goal: Risk for impaired skin integrity will decrease Outcome: Adequate for Discharge   Problem: Education: Goal: Ability to describe self-care measures that may prevent or decrease complications (Diabetes Survival Skills Education) will improve Outcome: Adequate for Discharge Goal: Individualized Educational Video(s) Outcome: Adequate for Discharge   Problem: Coping: Goal: Ability to adjust to condition or change in health will improve Outcome: Adequate for Discharge   Problem: Fluid Volume: Goal: Ability to maintain a balanced intake and output will improve Outcome: Adequate for Discharge   Problem: Health Behavior/Discharge Planning: Goal: Ability to identify and utilize available resources and services will improve Outcome: Adequate for Discharge Goal: Ability to manage health-related needs will improve Outcome: Adequate for Discharge   Problem: Metabolic: Goal: Ability to maintain appropriate glucose levels will improve Outcome: Adequate for Discharge   Problem: Nutritional: Goal: Maintenance of adequate nutrition will improve Outcome: Adequate for Discharge Goal: Progress toward achieving an optimal weight will improve Outcome: Adequate for Discharge   Problem: Skin Integrity: Goal: Risk for impaired skin integrity will decrease Outcome: Adequate for Discharge   Problem: Tissue Perfusion: Goal: Adequacy of tissue perfusion will improve Outcome: Adequate for Discharge   

## 2021-10-07 NOTE — Care Management Important Message (Signed)
Important Message  Patient Details  Name: [name] MRN: [id number] Date of Birth: [birth year]   Medicare Important Message Given:  Yes     [name] 10/07/2021, 10:34 AM

## 2021-10-07 NOTE — Telephone Encounter (Signed)
Was unable to speak with pt/wife so I sent a message thru MyChart to let them know Dr. Tamela Oddi approval for taking  Kzithromycin tablets  AND Cefdinir capsules and that he is not allergic to them bc he was treated with them in the hospital

## 2021-10-07 NOTE — Care Management (Addendum)
1108 10-07-21 DME Rollator ordered via [employer] and agency will deliver to the room prior to discharge. No further needs identified at this time.   1125 10-07-21 Case Manager spoke with patient and family wants home health physical therapy services. Family does not have a preference and wants the referral to go to [facility]. Referral submitted and start of care to begin within 24-48 hours post transition home. No further needs at this time.

## 2021-10-07 NOTE — Progress Notes (Signed)
Occupational Therapy Treatment Patient Details Name: [name] MRN: [id number] DOB: 10/01/1928 Today's Date: 10/07/2021   History of present illness Pt adm 8/6 with sepsis due to PNA. Pt with recent neck mass but not seen on current imaging. PMH - cabg, HTN, DM, afib   OT comments  Pt progressing and wanted to go over energy conservation techniques and assist with LB dressing  in order to be ready to go home. Pt set-upA to minA overall for ADL tasks; set-upA for UB ADL and minA for LB ADL. Family present for energy conservation techniques. Pt would benefit from continued OT skilled services. OT following acutely.   Recommendations for follow up therapy are one component of a multi-disciplinary discharge planning process, led by the attending physician.  Recommendations may be updated based on patient status, additional functional criteria and insurance authorization.    Follow Up Recommendations  No OT follow up    Assistance Recommended at Discharge Frequent or constant Supervision/Assistance  Patient can return home with the following  A little help with walking and/or transfers;A little help with bathing/dressing/bathroom;Assistance with cooking/housework;Assist for transportation   Equipment Recommendations  None recommended by OT    Recommendations for Other Services      Precautions / Restrictions Precautions Precautions: Fall Restrictions Weight Bearing Restrictions: No       Mobility Bed Mobility               General bed mobility comments: in recliner pre and post session    Transfers Overall transfer level: Needs assistance Equipment used: Rolling walker (2 wheels) Transfers: Sit to/from Stand Sit to Stand: Min guard           General transfer comment: Assist for safety. Uses rocking momentum to rise. Has lift chair at home     Balance Overall balance assessment: Mild deficits observed, not formally tested                                          ADL either performed or assessed with clinical judgement   ADL Overall ADL's : Needs assistance/impaired                 Upper Body Dressing : Set up;Sitting   Lower Body Dressing: Minimal assistance;Sitting/lateral leans;Sit to/from stand               Functional mobility during ADLs: Supervision/safety;Rolling walker (2 wheels);Cueing for safety General ADL Comments: Pt set-upA to minA overall for ADL tasks; set-upA for UB ADL and minA for LB ADL. Family present for energy conservation techniques.    Extremity/Trunk Assessment Upper Extremity Assessment Upper Extremity Assessment: Overall WFL for tasks assessed   Lower Extremity Assessment Lower Extremity Assessment: Generalized weakness        Vision   Vision Assessment?: No apparent visual deficits   Perception     Praxis      Cognition Arousal/Alertness: Awake/alert Behavior During Therapy: WFL for tasks assessed/performed Overall Cognitive Status: Within Functional Limits for tasks assessed                                          Exercises      Shoulder Instructions       General Comments      Pertinent Vitals/ Pain  Pain Assessment Pain Assessment: No/denies pain  Home Living                                          Prior Functioning/Environment              Frequency  Min 2X/week        Progress Toward Goals  OT Goals(current goals can now be found in the care plan section)  Progress towards OT goals: Progressing toward goals  Acute Rehab OT Goals Patient Stated Goal: to go home OT Goal Formulation: With patient Time For Goal Achievement: 10/19/21 Potential to Achieve Goals: Good  Plan Discharge plan remains appropriate    Co-evaluation                 AM-PAC OT "6 Clicks" Daily Activity     Outcome Measure   Help from another person eating meals?: None Help from another person taking care of  personal grooming?: A Little Help from another person toileting, which includes using toliet, bedpan, or urinal?: A Little Help from another person bathing (including washing, rinsing, drying)?: A Little Help from another person to put on and taking off regular upper body clothing?: A Little Help from another person to put on and taking off regular lower body clothing?: A Little 6 Click Score: 19    End of Session Equipment Utilized During Treatment: Gait belt;Rolling walker (2 wheels)  OT Visit Diagnosis: Unsteadiness on feet (R26.81);Muscle weakness (generalized) (M62.81)   Activity Tolerance Patient tolerated treatment well   Patient Left in chair;with call bell/phone within reach;with family/visitor present   Nurse Communication Mobility status        Time: 1050-1110 OT Time Calculation (min): 20 min  Charges: OT General Charges $OT Visit: 1 Visit OT Treatments $Self Care/Home Management : 8-22 mins  [name], OTR/L Acute Rehabilitation Services Office: [phone]   [id number]  10/07/2021, 12:59 PM

## 2021-10-08 ENCOUNTER — Other Ambulatory Visit: Payer: Self-pay

## 2021-10-08 ENCOUNTER — Telehealth: Payer: Self-pay

## 2021-10-08 ENCOUNTER — Telehealth: Payer: Self-pay | Admitting: *Deleted

## 2021-10-08 DIAGNOSIS — E1121 Type 2 diabetes mellitus with diabetic nephropathy: Secondary | ICD-10-CM

## 2021-10-08 NOTE — Telephone Encounter (Signed)
10/08/2021  [name] DOB: [birth year] MRN: [id number]   RIDER WAIVER AND RELEASE OF LIABILITY  For purposes of improving physical access to our facilities, [facility] is pleased to partner with third parties to provide [facility] [place] patients or other authorized individuals the option of convenient, on-demand ground transportation services (the [place]") through use of the technology service that enables users to request on-demand ground transportation from independent third-party providers.  By opting to use and accept these [employer], I, the undersigned, hereby agree on behalf of myself, and on behalf of any minor child using the Government social research officer for whom I am the parent or legal guardian, as follows:  Government social research officer provided to me are provided by independent third-party transportation providers who are not [employer] or employees and who are unaffiliated with [employer]. [facility] is neither a transportation carrier nor a common or public carrier. [facility] has no control over the quality or safety of the transportation that occurs as a result of the [employer]. [facility] cannot guarantee that any third-party transportation provider will complete any arranged transportation service. [facility] makes no representation, warranty, or guarantee regarding the reliability, timeliness, quality, safety, suitability, or availability of any of the Transport Services or that they will be error free. I fully understand that traveling by vehicle involves risks and dangers of serious bodily injury, including permanent disability, paralysis, and death. I agree, on behalf of myself and on behalf of any minor child using the Transport Services for whom I am the parent or legal guardian, that the entire risk arising out of my use of the [employer] remains solely with me, to the maximum extent permitted under applicable law. The [employer] are  provided "as is" and "as available." [facility] disclaims all representations and warranties, express, implied or statutory, not expressly set out in these terms, including the implied warranties of merchantability and fitness for a particular purpose. I hereby waive and release [facility], its agents, employees, officers, directors, representatives, insurers, attorneys, assigns, successors, subsidiaries, and affiliates from any and all past, present, or future claims, demands, liabilities, actions, causes of action, or suits of any kind directly or indirectly arising from acceptance and use of the [employer]. I further waive and release [facility] and its affiliates from all present and future liability and responsibility for any injury or death to persons or damages to property caused by or related to the use of the [employer]. I have read this Waiver and Release of Liability, and I understand the terms used in it and their legal significance. This Waiver is freely and voluntarily given with the understanding that my right (as well as the right of any minor child for whom I am the parent or legal guardian using the [employer]) to legal recourse against [facility] in connection with the [employer] is knowingly surrendered in return for use of these services.   I attest that I read the Ride Waiver and Release of Liability to [name], gave Mr. Streater the opportunity to ask questions and answered the questions asked (if any). I affirm that Kyair Ditommaso then provided consent for assistance with transportation.     Bonnielee Haff                                Telephone encounter was:  Successful.  10/08/2021 Name: Bailen Geffre MRN: [id number] DOB: August 23, 1928  Heron Pitcock is a 88 y.o. year old male who is a primary care patient of [name], MD . The community resource team was consulted for assistance with Transportation Needs   Care  guide performed the following interventions: Patient provided with information about care guide support team and interviewed to confirm resource needs Follow up call placed to community resources to determine status of patients referral. Gathered all details for upcoming ride on 10/13/2021 with wife to [place] office for hospital follow up booked ride through [facility] and the pick up times are scheduled and patients wife has been notified . Follow Up Plan:  No further follow up planned at this time. The patient has been provided with needed resources.  [facility] [phone] , [place] [phone] Email : [email or web address]

## 2021-10-08 NOTE — Patient Outreach (Signed)
  Care Coordination [facility] Note Transition Care Management Follow-up Telephone Call Date of discharge and from where: 10/07/21 [name] How have you been since you were released from the hospital? Gets short of breath easily when moving around Any questions or concerns? No  Items Reviewed: Did the pt receive and understand the discharge instructions provided? Yes  Medications obtained and verified? Yes  Other? No  Any new allergies since your discharge? No  Dietary orders reviewed? Yes Do you have support at home? Yes   Home Care and Equipment/Supplies: Were home health services ordered? yes If so, what is the name of the agency? [facility]  Has the agency set up a time to come to the patient's home? yes Were any new equipment or medical supplies ordered?  Yes: walker What is the name of the medical supply agency? [employer] Were you able to get the supplies/equipment? yes Do you have any questions related to the use of the equipment or supplies? No  Functional Questionnaire: (I = Independent and D = Dependent) ADLs: I  Bathing/Dressing- I  Meal Prep- D  Eating- I  Maintaining continence- I  Transferring/Ambulation- I  Managing Meds- I  Follow up appointments reviewed:  PCP Hospital f/u appt confirmed? Yes  Scheduled to see Dr Jonni Sanger on 10/13/21 @ 9:30 AM. [facility] f/u appt confirmed?  N/A   Are transportation arrangements needed? Yes  If their condition worsens, is the pt aware to call PCP or go to the Emergency Dept.? Yes Was the patient provided with contact information for the PCP's office or ED? Yes Was to pt encouraged to call back with questions or concerns? Yes  SDOH assessments and interventions completed:   Yes  Care Coordination Interventions Activated:  Yes   Care Coordination Interventions:  Transportation arranged    Encounter Outcome:  Pt. Visit Completed   Peter Garter RN, BSN,CCM, CDE Care Management Coordinator [facility] [phone]

## 2021-10-08 NOTE — Telephone Encounter (Signed)
Transition Care Management Follow-up Telephone Call Date of discharge and from where: [facility] 10/07/21 How have you been since you were released from the hospital? Better than yesterday  Any questions or concerns? No  Items Reviewed: Did the pt receive and understand the discharge instructions provided? Yes  Medications obtained and verified? Yes  Other? No  Any new allergies since your discharge? No  Dietary orders reviewed? Yes Do you have support at home? Yes   Home Care and Equipment/Supplies: Were home health services ordered? yes If so, what is the name of the agency? [facility] ( [facility]   Has the agency set up a time to come to the patient's home? no Were any new equipment or medical supplies ordered?  Yes: rolling walker with seat at discharge  What is the name of the medical supply agency? [facility]  Were you able to get the supplies/equipment? yes Do you have any questions related to the use of the equipment or supplies? No  Functional Questionnaire: (I = Independent and D = Dependent) ADLs: I  Bathing/Dressing- I  Meal Prep- D  Eating- I  Maintaining continence- I  Transferring/Ambulation- I WITH WALKER   Managing Meds- I  Follow up appointments reviewed:  PCP Hospital f/u appt confirmed? Yes  Scheduled to see Dr Jonni Sanger  on 10/13/21 @ 9:30. [facility] f/u appt confirmed? No  Scheduled an appt with cardiology  Are transportation arrangements needed? Yes [facility] transportation set up  If their condition worsens, is the pt aware to call PCP or go to the Emergency Dept.? Yes Was the patient provided with contact information for the PCP's office or ED? Yes Was to pt encouraged to call back with questions or concerns? Yes

## 2021-10-09 ENCOUNTER — Telehealth: Payer: Self-pay | Admitting: Family Medicine

## 2021-10-09 LAB — CULTURE, BLOOD (ROUTINE X 2)
Culture: NO GROWTH
Culture: NO GROWTH
Special Requests: ADEQUATE
Special Requests: ADEQUATE

## 2021-10-09 NOTE — Telephone Encounter (Signed)
FYI  Caller states:  - She is an Therapist, sports, patient Transport planner calling from [facility]  - She was informed by patient that he will call after OV with PCP on 10/13/21 to tell them if he wants to start OT and PT.  -They have not seen patient as of now.

## 2021-10-10 ENCOUNTER — Other Ambulatory Visit: Payer: Self-pay

## 2021-10-10 ENCOUNTER — Observation Stay (HOSPITAL_COMMUNITY): Payer: Medicare Other

## 2021-10-10 ENCOUNTER — Emergency Department (HOSPITAL_COMMUNITY): Payer: Medicare Other

## 2021-10-10 ENCOUNTER — Encounter (HOSPITAL_COMMUNITY): Payer: Self-pay

## 2021-10-10 ENCOUNTER — Inpatient Hospital Stay (HOSPITAL_COMMUNITY)
Admission: EM | Admit: 2021-10-10 | Discharge: 2021-10-12 | DRG: 291 | Disposition: A | Discharge status: Home Health | Payer: Medicare Other | Attending: Internal Medicine | Admitting: Internal Medicine

## 2021-10-10 DIAGNOSIS — Z6833 Body mass index (BMI) 33.0-33.9, adult: Secondary | ICD-10-CM

## 2021-10-10 DIAGNOSIS — K219 Gastro-esophageal reflux disease without esophagitis: Secondary | ICD-10-CM | POA: Diagnosis present

## 2021-10-10 DIAGNOSIS — Z7984 Long term (current) use of oral hypoglycemic drugs: Secondary | ICD-10-CM

## 2021-10-10 DIAGNOSIS — E1121 Type 2 diabetes mellitus with diabetic nephropathy: Secondary | ICD-10-CM | POA: Diagnosis present

## 2021-10-10 DIAGNOSIS — Z79899 Other long term (current) drug therapy: Secondary | ICD-10-CM | POA: Diagnosis not present

## 2021-10-10 DIAGNOSIS — Z955 Presence of coronary angioplasty implant and graft: Secondary | ICD-10-CM

## 2021-10-10 DIAGNOSIS — Z87891 Personal history of nicotine dependence: Secondary | ICD-10-CM

## 2021-10-10 DIAGNOSIS — R000 Tachycardia, unspecified: Secondary | ICD-10-CM | POA: Diagnosis not present

## 2021-10-10 DIAGNOSIS — I5033 Acute on chronic diastolic (congestive) heart failure: Secondary | ICD-10-CM | POA: Diagnosis not present

## 2021-10-10 DIAGNOSIS — I350 Nonrheumatic aortic (valve) stenosis: Secondary | ICD-10-CM | POA: Diagnosis present

## 2021-10-10 DIAGNOSIS — E669 Obesity, unspecified: Secondary | ICD-10-CM | POA: Diagnosis present

## 2021-10-10 DIAGNOSIS — M1909 Primary osteoarthritis, other specified site: Secondary | ICD-10-CM | POA: Diagnosis present

## 2021-10-10 DIAGNOSIS — I110 Hypertensive heart disease with heart failure: Secondary | ICD-10-CM | POA: Diagnosis present

## 2021-10-10 DIAGNOSIS — M19041 Primary osteoarthritis, right hand: Secondary | ICD-10-CM | POA: Diagnosis present

## 2021-10-10 DIAGNOSIS — M19042 Primary osteoarthritis, left hand: Secondary | ICD-10-CM | POA: Diagnosis present

## 2021-10-10 DIAGNOSIS — I482 Chronic atrial fibrillation, unspecified: Secondary | ICD-10-CM | POA: Diagnosis present

## 2021-10-10 DIAGNOSIS — A419 Sepsis, unspecified organism: Secondary | ICD-10-CM | POA: Diagnosis not present

## 2021-10-10 DIAGNOSIS — Z7901 Long term (current) use of anticoagulants: Secondary | ICD-10-CM

## 2021-10-10 DIAGNOSIS — Z20822 Contact with and (suspected) exposure to covid-19: Secondary | ICD-10-CM | POA: Diagnosis present

## 2021-10-10 DIAGNOSIS — I2510 Atherosclerotic heart disease of native coronary artery without angina pectoris: Secondary | ICD-10-CM | POA: Diagnosis present

## 2021-10-10 DIAGNOSIS — Z87440 Personal history of urinary (tract) infections: Secondary | ICD-10-CM

## 2021-10-10 DIAGNOSIS — R531 Weakness: Secondary | ICD-10-CM | POA: Diagnosis not present

## 2021-10-10 DIAGNOSIS — R0600 Dyspnea, unspecified: Secondary | ICD-10-CM | POA: Diagnosis present

## 2021-10-10 DIAGNOSIS — R609 Edema, unspecified: Secondary | ICD-10-CM | POA: Diagnosis not present

## 2021-10-10 DIAGNOSIS — E66811 Obesity, class 1: Secondary | ICD-10-CM | POA: Diagnosis present

## 2021-10-10 DIAGNOSIS — Z794 Long term (current) use of insulin: Secondary | ICD-10-CM

## 2021-10-10 DIAGNOSIS — Z951 Presence of aortocoronary bypass graft: Secondary | ICD-10-CM

## 2021-10-10 DIAGNOSIS — N138 Other obstructive and reflux uropathy: Secondary | ICD-10-CM | POA: Diagnosis present

## 2021-10-10 DIAGNOSIS — D649 Anemia, unspecified: Secondary | ICD-10-CM | POA: Diagnosis present

## 2021-10-10 DIAGNOSIS — I10 Essential (primary) hypertension: Secondary | ICD-10-CM | POA: Diagnosis present

## 2021-10-10 DIAGNOSIS — E876 Hypokalemia: Secondary | ICD-10-CM | POA: Diagnosis present

## 2021-10-10 DIAGNOSIS — I152 Hypertension secondary to endocrine disorders: Secondary | ICD-10-CM | POA: Diagnosis present

## 2021-10-10 DIAGNOSIS — E782 Mixed hyperlipidemia: Secondary | ICD-10-CM | POA: Diagnosis present

## 2021-10-10 DIAGNOSIS — E1169 Type 2 diabetes mellitus with other specified complication: Secondary | ICD-10-CM | POA: Diagnosis present

## 2021-10-10 DIAGNOSIS — E1140 Type 2 diabetes mellitus with diabetic neuropathy, unspecified: Secondary | ICD-10-CM | POA: Diagnosis present

## 2021-10-10 DIAGNOSIS — J189 Pneumonia, unspecified organism: Secondary | ICD-10-CM | POA: Diagnosis not present

## 2021-10-10 DIAGNOSIS — Z87442 Personal history of urinary calculi: Secondary | ICD-10-CM

## 2021-10-10 DIAGNOSIS — R059 Cough, unspecified: Secondary | ICD-10-CM | POA: Diagnosis not present

## 2021-10-10 DIAGNOSIS — N401 Enlarged prostate with lower urinary tract symptoms: Secondary | ICD-10-CM | POA: Diagnosis present

## 2021-10-10 DIAGNOSIS — Z8701 Personal history of pneumonia (recurrent): Secondary | ICD-10-CM

## 2021-10-10 DIAGNOSIS — Z96641 Presence of right artificial hip joint: Secondary | ICD-10-CM | POA: Diagnosis present

## 2021-10-10 DIAGNOSIS — Z881 Allergy status to other antibiotic agents status: Secondary | ICD-10-CM

## 2021-10-10 LAB — CBC WITH DIFFERENTIAL/PLATELET
Abs Immature Granulocytes: 0.23 10*3/uL — ABNORMAL HIGH (ref 0.00–0.07)
Basophils Absolute: 0.1 10*3/uL (ref 0.0–0.1)
Basophils Relative: 1 %
Eosinophils Absolute: 0.3 10*3/uL (ref 0.0–0.5)
Eosinophils Relative: 2 %
HCT: 33.4 % — ABNORMAL LOW (ref 39.0–52.0)
Hemoglobin: 10.3 g/dL — ABNORMAL LOW (ref 13.0–17.0)
Immature Granulocytes: 2 %
Lymphocytes Relative: 9 %
Lymphs Abs: 1.2 10*3/uL (ref 0.7–4.0)
MCH: 25.3 pg — ABNORMAL LOW (ref 26.0–34.0)
MCHC: 30.8 g/dL (ref 30.0–36.0)
MCV: 82.1 fL (ref 80.0–100.0)
Monocytes Absolute: 1.5 10*3/uL — ABNORMAL HIGH (ref 0.1–1.0)
Monocytes Relative: 11 %
Neutro Abs: 10.1 10*3/uL — ABNORMAL HIGH (ref 1.7–7.7)
Neutrophils Relative %: 75 %
Platelets: 355 10*3/uL (ref 150–400)
RBC: 4.07 MIL/uL — ABNORMAL LOW (ref 4.22–5.81)
RDW: 17 % — ABNORMAL HIGH (ref 11.5–15.5)
WBC: 13.4 10*3/uL — ABNORMAL HIGH (ref 4.0–10.5)
nRBC: 0 % (ref 0.0–0.2)

## 2021-10-10 LAB — URINALYSIS, ROUTINE W REFLEX MICROSCOPIC
Bacteria, UA: NONE SEEN
Bilirubin Urine: NEGATIVE
Glucose, UA: >=500 mg/dL — AB
Hgb urine dipstick: NEGATIVE
Ketones, ur: 5 mg/dL — AB
Leukocytes,Ua: NEGATIVE
Nitrite: NEGATIVE
Protein, ur: NEGATIVE mg/dL
Specific Gravity, Urine: 1.015 (ref 1.005–1.030)
pH: 6 (ref 5.0–8.0)

## 2021-10-10 LAB — BLOOD GAS, VENOUS
Acid-Base Excess: 0.5 mmol/L (ref 0.0–2.0)
Bicarbonate: 25.4 mmol/L (ref 20.0–28.0)
O2 Saturation: 60.8 %
Patient temperature: 37
pCO2, Ven: 41 mmHg — ABNORMAL LOW (ref 44–60)
pH, Ven: 7.4 (ref 7.25–7.43)
pO2, Ven: 35 mmHg (ref 32–45)

## 2021-10-10 LAB — ECHOCARDIOGRAM COMPLETE
AR max vel: 1.25 cm2
AV Area VTI: 1.05 cm2
AV Area mean vel: 1.18 cm2
AV Mean grad: 19.3 mmHg
AV Peak grad: 34.2 mmHg
AV Vena cont: 0.3 cm
Ao pk vel: 2.92 m/s
Calc EF: 52.9 %
Height: 66 in
P 1/2 time: 536 msec
S' Lateral: 3.4 cm
Single Plane A2C EF: 50.3 %
Single Plane A4C EF: 56.6 %
Weight: 3360 oz

## 2021-10-10 LAB — COMPREHENSIVE METABOLIC PANEL
ALT: 33 U/L (ref 0–44)
AST: 32 U/L (ref 15–41)
Albumin: 2.8 g/dL — ABNORMAL LOW (ref 3.5–5.0)
Alkaline Phosphatase: 68 U/L (ref 38–126)
Anion gap: 10 (ref 5–15)
BUN: 16 mg/dL (ref 8–23)
CO2: 24 mmol/L (ref 22–32)
Calcium: 8.7 mg/dL — ABNORMAL LOW (ref 8.9–10.3)
Chloride: 107 mmol/L (ref 98–111)
Creatinine, Ser: 1.13 mg/dL (ref 0.61–1.24)
GFR, Estimated: >60 mL/min (ref >60)
Glucose, Bld: 143 mg/dL — ABNORMAL HIGH (ref 70–99)
Potassium: 3.4 mmol/L — ABNORMAL LOW (ref 3.5–5.1)
Sodium: 141 mmol/L (ref 135–145)
Total Bilirubin: 1 mg/dL (ref 0.3–1.2)
Total Protein: 6.2 g/dL — ABNORMAL LOW (ref 6.5–8.1)

## 2021-10-10 LAB — PHOSPHORUS: Phosphorus: 4 mg/dL (ref 2.5–4.6)

## 2021-10-10 LAB — LACTIC ACID, PLASMA: Lactic Acid, Venous: 0.9 mmol/L (ref 0.5–1.9)

## 2021-10-10 LAB — TROPONIN I (HIGH SENSITIVITY)
Troponin I (High Sensitivity): 15 ng/L (ref <18)
Troponin I (High Sensitivity): 14 ng/L (ref <18)

## 2021-10-10 LAB — PROTIME-INR
INR: 1.4 — ABNORMAL HIGH (ref 0.8–1.2)
Prothrombin Time: 17.2 seconds — ABNORMAL HIGH (ref 11.4–15.2)

## 2021-10-10 LAB — BRAIN NATRIURETIC PEPTIDE: B Natriuretic Peptide: 207.5 pg/mL — ABNORMAL HIGH (ref 0.0–100.0)

## 2021-10-10 LAB — GLUCOSE, CAPILLARY
Glucose-Capillary: 151 mg/dL — ABNORMAL HIGH (ref 70–99)
Glucose-Capillary: 189 mg/dL — ABNORMAL HIGH (ref 70–99)

## 2021-10-10 LAB — RESP PANEL BY RT-PCR (FLU A&B, COVID) ARPGX2
Influenza A by PCR: NEGATIVE
Influenza B by PCR: NEGATIVE
SARS Coronavirus 2 by RT PCR: NEGATIVE

## 2021-10-10 LAB — MAGNESIUM: Magnesium: 1.8 mg/dL (ref 1.7–2.4)

## 2021-10-10 MED ORDER — LIDOCAINE 5 % EX PTCH
1.0000 | MEDICATED_PATCH | CUTANEOUS | Status: DC
Start: 2021-10-10 — End: 2021-10-10

## 2021-10-10 MED ORDER — SODIUM CHLORIDE 0.9 % IV SOLN
500.0000 mg | Freq: Once | INTRAVENOUS | Status: AC
  Administered 2021-10-10: 500 mg via INTRAVENOUS
  Filled 2021-10-10: qty 5

## 2021-10-10 MED ORDER — MELATONIN 3 MG PO TABS
3.0000 mg | ORAL_TABLET | Freq: Every day | ORAL | Status: DC
  Administered 2021-10-10 – 2021-10-11 (×2): 3 mg via ORAL
  Filled 2021-10-10 (×2): qty 1

## 2021-10-10 MED ORDER — DILTIAZEM HCL 25 MG/5ML IV SOLN
10.0000 mg | Freq: Once | INTRAVENOUS | Status: AC
  Administered 2021-10-10: 10 mg via INTRAVENOUS
  Filled 2021-10-10: qty 5

## 2021-10-10 MED ORDER — POTASSIUM CHLORIDE CRYS ER 20 MEQ PO TBCR
40.0000 meq | EXTENDED_RELEASE_TABLET | Freq: Once | ORAL | Status: AC
  Administered 2021-10-10: 40 meq via ORAL
  Filled 2021-10-10: qty 2

## 2021-10-10 MED ORDER — TAMSULOSIN HCL 0.4 MG PO CAPS
0.4000 mg | ORAL_CAPSULE | ORAL | Status: DC
  Administered 2021-10-11: 0.4 mg via ORAL
  Filled 2021-10-10: qty 1

## 2021-10-10 MED ORDER — DILTIAZEM HCL ER BEADS 240 MG PO CP24
360.0000 mg | ORAL_CAPSULE | Freq: Every day | ORAL | Status: DC
  Filled 2021-10-10: qty 1

## 2021-10-10 MED ORDER — ONDANSETRON HCL 4 MG PO TABS: 4.0000 mg | ORAL_TABLET | Freq: Four times a day (QID) | ORAL | Status: DC | PRN

## 2021-10-10 MED ORDER — DILTIAZEM HCL ER COATED BEADS 180 MG PO CP24
360.0000 mg | ORAL_CAPSULE | Freq: Every day | ORAL | Status: DC
  Administered 2021-10-10 – 2021-10-12 (×3): 360 mg via ORAL
  Filled 2021-10-10 (×3): qty 2

## 2021-10-10 MED ORDER — SODIUM CHLORIDE 0.9 % IV SOLN
1.0000 g | INTRAVENOUS | Status: DC
Start: 2021-10-10 — End: 2021-10-10
  Administered 2021-10-10: 1 g via INTRAVENOUS
  Filled 2021-10-10: qty 10

## 2021-10-10 MED ORDER — APIXABAN 5 MG PO TABS
5.0000 mg | ORAL_TABLET | Freq: Two times a day (BID) | ORAL | Status: DC
  Administered 2021-10-10 – 2021-10-12 (×4): 5 mg via ORAL
  Filled 2021-10-10 (×4): qty 1

## 2021-10-10 MED ORDER — DUTASTERIDE 0.5 MG PO CAPS
0.5000 mg | ORAL_CAPSULE | ORAL | Status: DC
  Administered 2021-10-12: 0.5 mg via ORAL
  Filled 2021-10-10: qty 1

## 2021-10-10 MED ORDER — FUROSEMIDE 10 MG/ML IJ SOLN
20.0000 mg | Freq: Every day | INTRAMUSCULAR | Status: DC
  Administered 2021-10-10 – 2021-10-12 (×3): 20 mg via INTRAVENOUS
  Filled 2021-10-10 (×3): qty 2

## 2021-10-10 MED ORDER — POTASSIUM CHLORIDE CRYS ER 20 MEQ PO TBCR
20.0000 meq | EXTENDED_RELEASE_TABLET | Freq: Every day | ORAL | Status: DC
  Administered 2021-10-11 – 2021-10-12 (×2): 20 meq via ORAL
  Filled 2021-10-10 (×2): qty 1

## 2021-10-10 MED ORDER — SODIUM CHLORIDE 0.9 % IV SOLN
1.0000 g | INTRAVENOUS | Status: AC
Start: 2021-10-10 — End: 2021-10-11
  Administered 2021-10-10: 1 g via INTRAVENOUS
  Filled 2021-10-10: qty 10

## 2021-10-10 MED ORDER — SODIUM CHLORIDE 0.9 % IV SOLN: 2.0000 g | INTRAVENOUS | Status: DC

## 2021-10-10 MED ORDER — ACETAMINOPHEN 325 MG PO TABS
650.0000 mg | ORAL_TABLET | Freq: Four times a day (QID) | ORAL | Status: DC | PRN
  Administered 2021-10-10: 650 mg via ORAL
  Filled 2021-10-10: qty 2

## 2021-10-10 MED ORDER — LISINOPRIL 5 MG PO TABS
2.5000 mg | ORAL_TABLET | Freq: Every day | ORAL | Status: DC
  Administered 2021-10-10 – 2021-10-12 (×3): 2.5 mg via ORAL
  Filled 2021-10-10 (×4): qty 1

## 2021-10-10 MED ORDER — NITROGLYCERIN 0.4 MG SL SUBL: 0.4000 mg | SUBLINGUAL_TABLET | SUBLINGUAL | Status: DC | PRN

## 2021-10-10 MED ORDER — ATORVASTATIN CALCIUM 40 MG PO TABS
40.0000 mg | ORAL_TABLET | Freq: Every day | ORAL | Status: DC
  Administered 2021-10-10 – 2021-10-11 (×2): 40 mg via ORAL
  Filled 2021-10-10 (×2): qty 1

## 2021-10-10 MED ORDER — EMPAGLIFLOZIN 25 MG PO TABS
25.0000 mg | ORAL_TABLET | Freq: Every day | ORAL | Status: DC
  Administered 2021-10-11 – 2021-10-12 (×2): 25 mg via ORAL
  Filled 2021-10-10 (×2): qty 1

## 2021-10-10 MED ORDER — ONDANSETRON HCL 4 MG/2ML IJ SOLN: 4.0000 mg | Freq: Four times a day (QID) | INTRAMUSCULAR | Status: DC | PRN

## 2021-10-10 MED ORDER — PANTOPRAZOLE SODIUM 40 MG PO TBEC
40.0000 mg | DELAYED_RELEASE_TABLET | Freq: Every day | ORAL | Status: DC
  Administered 2021-10-10 – 2021-10-12 (×3): 40 mg via ORAL
  Filled 2021-10-10 (×3): qty 1

## 2021-10-10 MED ORDER — METFORMIN HCL 500 MG PO TABS
500.0000 mg | ORAL_TABLET | Freq: Two times a day (BID) | ORAL | Status: DC
  Administered 2021-10-10: 500 mg via ORAL
  Filled 2021-10-10: qty 1

## 2021-10-10 MED ORDER — LEVALBUTEROL HCL 0.63 MG/3ML IN NEBU: 0.6300 mg | INHALATION_SOLUTION | Freq: Four times a day (QID) | RESPIRATORY_TRACT | Status: DC | PRN

## 2021-10-10 MED ORDER — GABAPENTIN 300 MG PO CAPS
300.0000 mg | ORAL_CAPSULE | Freq: Three times a day (TID) | ORAL | Status: DC
  Administered 2021-10-10 – 2021-10-12 (×6): 300 mg via ORAL
  Filled 2021-10-10 (×6): qty 1

## 2021-10-10 MED ORDER — MAGNESIUM SULFATE 2 GM/50ML IV SOLN
2.0000 g | Freq: Once | INTRAVENOUS | Status: AC
  Administered 2021-10-10: 2 g via INTRAVENOUS
  Filled 2021-10-10: qty 50

## 2021-10-10 MED ORDER — INSULIN GLARGINE-YFGN 100 UNIT/ML ~~LOC~~ SOLN
40.0000 [IU] | Freq: Every day | SUBCUTANEOUS | Status: DC
  Administered 2021-10-10 – 2021-10-12 (×3): 40 [IU] via SUBCUTANEOUS
  Filled 2021-10-10 (×3): qty 0.4

## 2021-10-10 MED ORDER — ACETAMINOPHEN 650 MG RE SUPP: 650.0000 mg | Freq: Four times a day (QID) | RECTAL | Status: DC | PRN

## 2021-10-10 MED ORDER — METOPROLOL SUCCINATE ER 50 MG PO TB24
100.0000 mg | ORAL_TABLET | Freq: Every day | ORAL | Status: DC
  Administered 2021-10-10 – 2021-10-12 (×3): 100 mg via ORAL
  Filled 2021-10-10 (×3): qty 2

## 2021-10-10 MED ORDER — ACETAMINOPHEN 325 MG PO TABS
650.0000 mg | ORAL_TABLET | Freq: Once | ORAL | Status: AC
  Administered 2021-10-10: 650 mg via ORAL
  Filled 2021-10-10: qty 2

## 2021-10-10 NOTE — ED Notes (Signed)
ED Provider at bedside. 

## 2021-10-10 NOTE — Progress Notes (Signed)
  Echocardiogram 2D Echocardiogram has been performed.  [name] 10/10/2021, 3:04 PM

## 2021-10-10 NOTE — Progress Notes (Signed)
Patient ambulated from ED stretcher to bathroom to have BM.  Sat on toilet for 10 minutes, then ambulated to chair with walker.  Per central monitoring, pt's HR sustained in 150s in Afib while ambulating.  Once back in chair, HR sustaining in 120s-130s in Afib despite administration of scheduled metoprolol and diltiazem.  MD notified, new orders placed.  Angie Fava, RN

## 2021-10-10 NOTE — ED Notes (Signed)
Called for purple man  

## 2021-10-10 NOTE — ED Triage Notes (Signed)
Just discharged on Wednesday after having PNA and sepsis. Patient started feeling bad again Thursday afternoon with increased SOB and bilateral leg swelling.

## 2021-10-10 NOTE — ED Notes (Signed)
Patient aware of need for urine specimen.  Urinal at bedside

## 2021-10-10 NOTE — ED Provider Notes (Signed)
[facility] DEPT Provider Note  CSN: [id number] Arrival date & time: 10/10/21 0744  Chief Complaint(s) Shortness of Breath  HPI [name] is a 88 y.o. male with history of coronary artery disease, diabetes, hypertension, hyperlipidemia, A-fib presenting to the emergency department shortness of breath.  He reports that he was recently hospitalized for pneumonia.  He was discharged on Wednesday, reports since discharge, he completed home antibiotics, has developed worsening shortness of breath, as well as new onset of leg swelling.  He has not had fevers or cough since leaving the hospital.  He reports generalized weakness and difficulty ambulating at home due to his weakness.  His wife and daughter reports that his symptoms are similar to when he was admitted although without fever.  Patient denies chest pain, abdominal pain, nausea, vomiting, syncope, lightheadedness, dizziness, headache.   Past Medical History Past Medical History:  Diagnosis Date   Abdominal pain    Acute renal failure ([place])     resolved   Arthritis    "hands & legs" (11/'10/2014)   Ataxia    Atrial fibrillation (HCC)    Bladder outlet obstruction    Bladder outlet obstruction    BPH (benign prostatic hyperplasia)    CAD (coronary artery disease)    a. CABG IN 1989. b. 01/08/2015 CTO of ost LAD, LIMA to LAD not visualized but assumed patent given myoview finding, occluded SVG to diagonal, 99% mid RCA tx w/ SYNERGY DES 3X28 mm   Constipation    Diabetes mellitus type 2, insulin dependent ([place])    Epistaxis, recurrent Feb 2017   GERD (gastroesophageal reflux disease)    HTN (hypertension)    Hx of bacterial pneumonia    Hyperlipemia    Kidney stones    Mild aortic stenosis    Mild cognitive impairment 03/05/2021   MMSE 26/30 and 6CIT 9 03/2021 AWV   Pneumonia 04/2019   Rib fractures    left rib fractures being treated with pain medications   Stented coronary artery Nov 2016    RCA DES   Urinary tract infection     Enterococcus   Patient Active Problem List   Diagnosis Date Noted   Acute dyspnea 10/10/2021   Acute on chronic diastolic congestive heart failure ([place]) 10/10/2021   Hypokalemia 10/10/2021   Normocytic anemia 10/10/2021   Sepsis due to pneumonia ([place]) 10/04/2021   Neck mass 10/04/2021   Mild cognitive impairment 03/05/2021   Stable hemispheric central retinal vein occlusion (CRVO) of right eye 07/21/2020   Moderate nonproliferative diabetic retinopathy of left eye ([place]) 06/18/2020   Thoracic aortic aneurysm without rupture ([place]) 05/26/2020   Stage 3b chronic kidney disease ([place]) 05/26/2020   Aortic stenosis, moderate 05/22/2019   Chronic diastolic CHF (congestive heart failure) ([place]) 03/14/2019   Lower extremity edema 03/14/2019   Spinal stenosis of lumbar region 02/21/2018   Degeneration of lumbar intervertebral disc 02/21/2018   Diabetic peripheral neuropathy associated with type 2 diabetes mellitus ([place]) 05/05/2017   Atherosclerotic heart disease of native coronary artery without angina pectoris 06/13/2016   CVA (cerebral vascular accident) ([place]) 06/06/2016   CAP (community acquired pneumonia) 06/02/2016   Recurrent coronary arteriosclerosis after percutaneous transluminal coronary angioplasty 04/18/2015   Epistaxis, recurrent 04/11/2015   CAD S/P PCI- Nov 2016    Chronic vertigo 03/15/2014   Current use of long term anticoagulation 02/06/2013   Obesity (BMI 30.0-34.9) 01/31/2012   Osteoarthritis, knee 01/31/2012   Allergic rhinitis 10/28/2011   Chronic atrial fibrillation ([place]) 07/30/2011  Hearing loss 06/08/2011   Primary osteoarthritis of hand 06/08/2011   Enlarged prostate without lower urinary tract symptoms (luts) 01/04/2011   Gastro-esophageal reflux disease without esophagitis 12/07/2010   Hx of CABG x 2 1990    Essential (primary) hypertension    Diabetes mellitus with diabetic nephropathy, with long-term current use of  insulin ([place])    Mixed hyperlipidemia    Benign hematuria 08/27/2008   Home Medication(s) Prior to Admission medications   Medication Sig Start Date End Date Taking? Authorizing Provider  atorvastatin (LIPITOR) 40 MG tablet Take 1 tablet (40 mg total) by mouth daily at 6 PM. 06/08/16  Yes Reyne Dumas, MD  AVODART 0.5 MG capsule Take 0.5 mg by mouth every Monday, Wednesday, and Friday.  07/20/11  Yes [provider]  diltiazem (TIAZAC) 360 MG 24 hr capsule Take 1 capsule by mouth daily. 11/08/19  Yes [provider]  empagliflozin (JARDIANCE) 25 MG TABS tablet Take 1 tablet (25 mg total) by mouth daily before breakfast. 09/03/20  Yes Martinique, Peter M, MD  fluticasone (FLONASE) 50 MCG/ACT nasal spray SHAKE LIQUID AND USE 1 SPRAY IN EACH NOSTRIL DAILY Patient taking differently: Place 1 spray into both nostrils daily. 05/20/21  Yes [name], MD  furosemide (LASIX) 40 MG tablet TAKE 1 TABLET(40 MG) BY MOUTH DAILY Patient taking differently: Take 40 mg by mouth daily. 07/28/21  Yes Martinique, Peter M, MD  gabapentin (NEURONTIN) 300 MG capsule TAKE 1 CAPSULE(300 MG) BY MOUTH THREE TIMES DAILY Patient taking differently: Take 300 mg by mouth 3 (three) times daily. 11/24/20  Yes [name], MD  insulin glargine (LANTUS) 100 UNIT/ML injection Inject 40 Units into the skin every morning.   Yes [provider]  metFORMIN (GLUCOPHAGE) 500 MG tablet Take 500 mg by mouth 2 (two) times daily with a meal.   Yes [provider]  metoprolol succinate (TOPROL-XL) 100 MG 24 hr tablet Take 1 tablet (100 mg total) by mouth daily. Take with or immediately following a meal. Patient taking differently: Take 100 mg by mouth daily. 09/09/21  Yes Martinique, Peter M, MD  nitroGLYCERIN (NITROSTAT) 0.4 MG SL tablet Place 0.4 mg under the tongue every 5 (five) minutes as needed for chest pain (x 3 doses).   Yes [provider]  pantoprazole (PROTONIX) 40 MG tablet TAKE 1 TABLET BY  MOUTH DAILY AT NOON Patient taking differently: Take 40 mg by mouth daily. 05/25/21  Yes Martinique, Peter M, MD  potassium chloride SA (KLOR-CON) 20 MEQ tablet TAKE 1 TABLET(20 MEQ) BY MOUTH DAILY Patient taking differently: Take 20 mEq by mouth daily. 12/22/20  Yes Martinique, Peter M, MD  tamsulosin (FLOMAX) 0.4 MG CAPS capsule Take 0.4 mg by mouth every other day. 03/19/21  Yes [provider]  apixaban (ELIQUIS) 5 MG TABS tablet Take 1 tablet (5 mg total) by mouth 2 (two) times daily. 06/08/16   Reyne Dumas, MD  azithromycin (ZITHROMAX) 250 MG tablet Take 1 tab daily 10/08/21   Charlynne Cousins, MD  lidocaine (LIDODERM) 5 % Place 1 patch onto the skin daily. Remove & Discard patch within 12 hours or as directed by MD Patient not taking: Reported on 10/10/2021 09/29/21   Regan Lemming, MD  Past Surgical History Past Surgical History:  Procedure Laterality Date   [place] N/A 01/08/2015   Procedure: Left Heart Cath and Cors/Grafts Angiography;  Surgeon: Peter M Martinique, MD;  Location: [facility];  Service: Cardiovascular;  Laterality: N/A;   CARDIAC CATHETERIZATION  01/08/2015   Procedure: Coronary Stent Intervention;  Surgeon: Peter M Martinique, MD;  Location: [facility];  Service: Cardiovascular;;   CARPAL TUNNEL RELEASE Right 08/2010   CATARACT EXTRACTION W/ INTRAOCULAR LENS  IMPLANT, BILATERAL Bilateral    CORONARY ANGIOPLASTY  01/08/15   RCA DES   CORONARY ARTERY BYPASS GRAFT  1989   "CABG X 2"   IR THORACENTESIS ASP PLEURAL SPACE W/IMG GUIDE  05/14/2019   JOINT REPLACEMENT     TONSILLECTOMY     TOTAL HIP ARTHROPLASTY Right 2000   Family History Family History  Problem Relation Age of Onset   Brain cancer Father    Throat cancer Brother     Social History Social History   Tobacco Use    Smoking status: Former    Packs/day: 3.00    Years: 10.00    Total pack years: 30.00    Types: Cigarettes    Quit date: 05/11/1958    Years since quitting: 63.4   Smokeless tobacco: Never  Vaping Use   Vaping Use: Never used  Substance Use Topics   Alcohol use: No   Drug use: No   Allergies Septra [sulfamethoxazole-trimethoprim], Cefadroxil, Ciprofloxacin, and Erythromycin  Review of Systems Review of Systems  All other systems reviewed and are negative.   Physical Exam Vital Signs  I have reviewed the triage vital signs BP 131/70   Pulse 96   Temp 98 F (36.7 C) (Oral)   Resp (!) 28   Ht '5\' 6"'$  (1.676 m)   Wt 95.3 kg   SpO2 98%   BMI 33.89 kg/m  Physical Exam Vitals and nursing note reviewed.  Constitutional:      General: He is not in acute distress.    Appearance: Normal appearance.  HENT:     Mouth/Throat:     Mouth: Mucous membranes are moist.  Cardiovascular:     Rate and Rhythm: Normal rate and regular rhythm.  Pulmonary:     Effort: Pulmonary effort is normal. No respiratory distress.     Breath sounds: Rales (right upper) present.     Comments: Mild increased work of breathing Abdominal:     General: Abdomen is flat.     Palpations: Abdomen is soft.     Tenderness: There is no abdominal tenderness.  Musculoskeletal:     Right lower leg: Edema present.     Left lower leg: Edema present.  Skin:    General: Skin is warm and dry.     Capillary Refill: Capillary refill takes less than 2 seconds.  Neurological:     Mental Status: He is alert and oriented to person, place, and time. Mental status is at baseline.  Psychiatric:        Mood and Affect: Mood normal.        Behavior: Behavior normal.     ED Results and Treatments Labs (all labs ordered are listed, but only abnormal results are displayed) Labs Reviewed  BRAIN NATRIURETIC PEPTIDE - Abnormal; Notable for the following components:      Result Value   B Natriuretic Peptide 207.5 (*)     All other components within normal limits  CBC WITH DIFFERENTIAL/PLATELET -  Abnormal; Notable for the following components:   WBC 13.4 (*)    RBC 4.07 (*)    Hemoglobin 10.3 (*)    HCT 33.4 (*)    MCH 25.3 (*)    RDW 17.0 (*)    Neutro Abs 10.1 (*)    Monocytes Absolute 1.5 (*)    Abs Immature Granulocytes 0.23 (*)    All other components within normal limits  BLOOD GAS, VENOUS - Abnormal; Notable for the following components:   pCO2, Ven 41 (*)    All other components within normal limits  PROTIME-INR - Abnormal; Notable for the following components:   Prothrombin Time 17.2 (*)    INR 1.4 (*)    All other components within normal limits  COMPREHENSIVE METABOLIC PANEL - Abnormal; Notable for the following components:   Potassium 3.4 (*)    Glucose, Bld 143 (*)    Calcium 8.7 (*)    Total Protein 6.2 (*)    Albumin 2.8 (*)    All other components within normal limits  RESP PANEL BY RT-PCR (FLU A&B, COVID) ARPGX2  CULTURE, BLOOD (ROUTINE X 2)  CULTURE, BLOOD (ROUTINE X 2)  EXPECTORATED SPUTUM ASSESSMENT W GRAM STAIN, RFLX TO RESP C  LACTIC ACID, PLASMA  MAGNESIUM  PHOSPHORUS  URINALYSIS, ROUTINE W REFLEX MICROSCOPIC  TROPONIN I (HIGH SENSITIVITY)  TROPONIN I (HIGH SENSITIVITY)                                                                                                                          Radiology DG Chest Port 1 View  Result Date: 10/10/2021 CLINICAL DATA:  88 year old male with sepsis. Multilobar right lung pneumonia. EXAM: PORTABLE CHEST 1 VIEW COMPARISON:  CTA chest 10/04/2021 and earlier. FINDINGS: Portable AP semi upright view at 0842 hours. Improved lung volumes. Regressed but not resolved multilobar right lung opacity since 10/04/2021. Most confluent residual is in the upper lobe. Stable cardiomegaly and mediastinal contours. Prior CABG. Left lung appears negative. No pneumothorax or pleural effusion. No acute osseous abnormality identified. IMPRESSION: 1.  Regressed but not resolved right lung Multilobar Pneumonia since 10/04/2021. 2. No new cardiopulmonary abnormality. Electronically Signed   By: Genevie Ann M.D.   On: 10/10/2021 09:03    Pertinent labs & imaging results that were available during my care of the patient were reviewed by me and considered in my medical decision making (see MDM for details).  Medications Ordered in ED Medications  cefTRIAXone (ROCEPHIN) 1 g in sodium chloride 0.9 % 100 mL IVPB (0 g Intravenous Stopped 10/10/21 1127)  dutasteride (AVODART) capsule 0.5 mg (has no administration in time range)  nitroGLYCERIN (NITROSTAT) SL tablet 0.4 mg (has no administration in time range)  atorvastatin (LIPITOR) tablet 40 mg (has no administration in time range)  apixaban (ELIQUIS) tablet 5 mg (has no administration in time range)  metFORMIN (GLUCOPHAGE) tablet 500 mg (has no administration in time range)  diltiazem (TIAZAC) 24 hr capsule 360 mg (has no  administration in time range)  empagliflozin (JARDIANCE) tablet 25 mg (has no administration in time range)  potassium chloride SA (KLOR-CON M) CR tablet 20 mEq (has no administration in time range)  tamsulosin (FLOMAX) capsule 0.4 mg (has no administration in time range)  pantoprazole (PROTONIX) EC tablet 40 mg (has no administration in time range)  metoprolol succinate (TOPROL-XL) 24 hr tablet 100 mg (has no administration in time range)  lidocaine (LIDODERM) 5 % 1 patch (has no administration in time range)  gabapentin (NEURONTIN) capsule 300 mg (has no administration in time range)  insulin glargine-yfgn (SEMGLEE) injection 40 Units (has no administration in time range)  acetaminophen (TYLENOL) tablet 650 mg (has no administration in time range)    Or  acetaminophen (TYLENOL) suppository 650 mg (has no administration in time range)  ondansetron (ZOFRAN) tablet 4 mg (has no administration in time range)    Or  ondansetron (ZOFRAN) injection 4 mg (has no administration in time  range)  levalbuterol (XOPENEX) nebulizer solution 0.63 mg (has no administration in time range)  lisinopril (ZESTRIL) tablet 2.5 mg (2.5 mg Oral Given 10/10/21 1308)  azithromycin (ZITHROMAX) 500 mg in sodium chloride 0.9 % 250 mL IVPB (0 mg Intravenous Stopped 10/10/21 1232)  acetaminophen (TYLENOL) tablet 650 mg (650 mg Oral Given 10/10/21 1116)  potassium chloride SA (KLOR-CON M) CR tablet 40 mEq (40 mEq Oral Given 10/10/21 1224)  magnesium sulfate IVPB 2 g 50 mL (2 g Intravenous New Bag/Given 10/10/21 1228)                                                                                                                                     Procedures Procedures  (including critical care time)  Medical Decision Making / ED Course   MDM:  88 year old male presenting to the emergency department for shortness of breath.  On exam, patient has persistent crackles in the area of his previous pneumonia.  He has bilateral lower extremity swelling.  He is afebrile but has mild tachycardia.  Differential includes recurrent or partially treated pneumonia, CHF, doubt pneumothorax, doubt ACS, will check chest x-ray, labs including troponin.  Lower concern for COPD without wheezing.  Given shortness of breath and generalized weakness, patient may need readmission to the hospital for additional antibiotic treatment  Clinical Course as of 10/10/21 1329  Sat Oct 10, 2021  1119 Discussed with Dr. Olevia Bowens, who agrees with admission for further antibiotic treatment, possible echocardiogram given new leg swelling [WS]    Clinical Course User Index [WS] Cristie Hem, MD     Additional history obtained: -Additional history obtained from wife, daughter -External records from outside source obtained and reviewed including: Chart review including previous notes, labs, imaging, consultation notes   Lab Tests: -I ordered, reviewed, and interpreted labs.   The pertinent results include:   Labs Reviewed   BRAIN NATRIURETIC PEPTIDE - Abnormal; Notable for the following components:  Result Value   B Natriuretic Peptide 207.5 (*)    All other components within normal limits  CBC WITH DIFFERENTIAL/PLATELET - Abnormal; Notable for the following components:   WBC 13.4 (*)    RBC 4.07 (*)    Hemoglobin 10.3 (*)    HCT 33.4 (*)    MCH 25.3 (*)    RDW 17.0 (*)    Neutro Abs 10.1 (*)    Monocytes Absolute 1.5 (*)    Abs Immature Granulocytes 0.23 (*)    All other components within normal limits  BLOOD GAS, VENOUS - Abnormal; Notable for the following components:   pCO2, Ven 41 (*)    All other components within normal limits  PROTIME-INR - Abnormal; Notable for the following components:   Prothrombin Time 17.2 (*)    INR 1.4 (*)    All other components within normal limits  COMPREHENSIVE METABOLIC PANEL - Abnormal; Notable for the following components:   Potassium 3.4 (*)    Glucose, Bld 143 (*)    Calcium 8.7 (*)    Total Protein 6.2 (*)    Albumin 2.8 (*)    All other components within normal limits  RESP PANEL BY RT-PCR (FLU A&B, COVID) ARPGX2  CULTURE, BLOOD (ROUTINE X 2)  CULTURE, BLOOD (ROUTINE X 2)  EXPECTORATED SPUTUM ASSESSMENT W GRAM STAIN, RFLX TO RESP C  LACTIC ACID, PLASMA  MAGNESIUM  PHOSPHORUS  URINALYSIS, ROUTINE W REFLEX MICROSCOPIC  TROPONIN I (HIGH SENSITIVITY)  TROPONIN I (HIGH SENSITIVITY)      EKG   EKG Interpretation  Date/Time:  Saturday October 10 2021 08:05:43 EDT Ventricular Rate:  113 PR Interval:    QRS Duration: 97 QT Interval:  324 QTC Calculation: 445 R Axis:   47 Text Interpretation: Atrial fibrillation Borderline repolarization abnormality Confirmed by Garnette Gunner [phone]) on 10/10/2021 8:32:46 AM         Imaging Studies ordered: I ordered imaging studies including chest x-ray I independently visualized and interpreted imaging. I agree with the radiologist interpretation   Medicines ordered and prescription drug  management: Meds ordered this encounter  Medications   cefTRIAXone (ROCEPHIN) 1 g in sodium chloride 0.9 % 100 mL IVPB    Order Specific Question:   Antibiotic Indication:    Answer:   CAP   azithromycin (ZITHROMAX) 500 mg in sodium chloride 0.9 % 250 mL IVPB   acetaminophen (TYLENOL) tablet 650 mg   dutasteride (AVODART) capsule 0.5 mg   nitroGLYCERIN (NITROSTAT) SL tablet 0.4 mg   atorvastatin (LIPITOR) tablet 40 mg   apixaban (ELIQUIS) tablet 5 mg   metFORMIN (GLUCOPHAGE) tablet 500 mg   diltiazem (TIAZAC) 24 hr capsule 360 mg   empagliflozin (JARDIANCE) tablet 25 mg   potassium chloride SA (KLOR-CON M) CR tablet 20 mEq    TAKE 1 TABLET(20 MEQ) BY MOUTH DAILY     tamsulosin (FLOMAX) capsule 0.4 mg   pantoprazole (PROTONIX) EC tablet 40 mg    Take 1 tablet by mouth daily at Noon     metoprolol succinate (TOPROL-XL) 24 hr tablet 100 mg    Take with or immediately following a meal.     lidocaine (LIDODERM) 5 % 1 patch    Remove & Discard patch within 12 hours or as directed by MD     gabapentin (NEURONTIN) capsule 300 mg   insulin glargine-yfgn (SEMGLEE) injection 40 Units   potassium chloride SA (KLOR-CON M) CR tablet 40 mEq   magnesium sulfate IVPB 2 g 50 mL  OR Linked Order Group    acetaminophen (TYLENOL) tablet 650 mg    acetaminophen (TYLENOL) suppository 650 mg   OR Linked Order Group    ondansetron (ZOFRAN) tablet 4 mg    ondansetron (ZOFRAN) injection 4 mg   levalbuterol (XOPENEX) nebulizer solution 0.63 mg   lisinopril (ZESTRIL) tablet 2.5 mg    -I have reviewed the patients home medicines and have made adjustments as needed    Consultations Obtained: I requested consultation with the hospitalist,  and discussed lab and imaging findings as well as pertinent plan - they recommend: Admission   Cardiac Monitoring: The patient was maintained on a cardiac monitor.  I personally viewed and interpreted the cardiac monitored which showed an underlying rhythm of:  atrial fibrillation  Social Determinants of Health:  Factors impacting patients care include: age   Reevaluation: After the interventions noted above, I reevaluated the patient and found that they have :stayed the same  Co morbidities that complicate the patient evaluation  Past Medical History:  Diagnosis Date   Abdominal pain    Acute renal failure ([place])     resolved   Arthritis    "hands & legs" (11/'10/2014)   Ataxia    Atrial fibrillation ([facility])    Bladder outlet obstruction    Bladder outlet obstruction    BPH (benign prostatic hyperplasia)    CAD (coronary artery disease)    a. CABG IN 1989. b. 01/08/2015 CTO of ost LAD, LIMA to LAD not visualized but assumed patent given myoview finding, occluded SVG to diagonal, 99% mid RCA tx w/ SYNERGY DES 3X28 mm   Constipation    Diabetes mellitus type 2, insulin dependent ([place])    Epistaxis, recurrent Feb 2017   GERD (gastroesophageal reflux disease)    HTN (hypertension)    Hx of bacterial pneumonia    Hyperlipemia    Kidney stones    Mild aortic stenosis    Mild cognitive impairment 03/05/2021   MMSE 26/30 and 6CIT 9 03/2021 AWV   Pneumonia 04/2019   Rib fractures    left rib fractures being treated with pain medications   Stented coronary artery Nov 2016   RCA DES   Urinary tract infection     Enterococcus      Dispostion: I considered admission for this patient, and given shortness of breath, tachycardia, persistent pneumonia on imaging, new leg swelling, I will admit for further treatment and work-up.     Final Clinical Impression(s) / ED Diagnoses Final diagnoses:  Community acquired pneumonia of right upper lobe of lung  Generalized weakness     This chart was dictated using voice recognition software.  Despite best efforts to proofread,  errors can occur which can change the documentation meaning.    Cristie Hem, MD 10/10/21 1329

## 2021-10-10 NOTE — ED Notes (Signed)
New green top sent to lab

## 2021-10-10 NOTE — Progress Notes (Signed)
   10/10/21 1646  Assess: MEWS Score  Temp 98.2 F (36.8 C)  BP (!) 147/76  MAP (mmHg) 98  Pulse Rate (!) 128  Resp 20  Level of Consciousness Alert  SpO2 99 %  O2 Device Room Air  Assess: MEWS Score  MEWS Temp 0  MEWS Systolic 0  MEWS Pulse 2  MEWS RR 0  MEWS LOC 0  MEWS Score 2  MEWS Score Color Yellow  Assess: if the MEWS score is Yellow or Red  Were vital signs taken at a resting state? Yes  Focused Assessment Change from prior assessment (see assessment flowsheet)  Does the patient meet 2 or more of the SIRS criteria? Yes  Does the patient have a confirmed or suspected source of infection? Yes  Provider and Rapid Response Notified?  (Provider notified)  MEWS guidelines implemented *See Row Information* Yes  Treat  MEWS Interventions Administered scheduled meds/treatments  Pain Scale 0-10  Pain Score 0  Take Vital Signs  Increase Vital Sign Frequency  Yellow: Q 2hr X 2 then Q 4hr X 2, if remains yellow, continue Q 4hrs  Escalate  MEWS: Escalate Yellow: discuss with charge nurse/RN and consider discussing with provider and RRT  Notify: Charge Nurse/RN  Name of Charge Nurse/RN Notified [place] Garland, RN  Date Charge Nurse/RN Notified 10/10/21  Time Charge Nurse/RN Notified 1653  Notify: Provider  Provider Name/Title Tennis Must, MD  Date Provider Notified 10/10/21  Time Provider Notified 1643  Method of Notification Face-to-face  Notification Reason Change in status  Provider response See new orders  Date of Provider Response 10/10/21  Time of Provider Response 1643  Assess: SIRS CRITERIA  SIRS Temperature  0  SIRS Pulse 1  SIRS Respirations  0  SIRS WBC 1  SIRS Score Sum  2

## 2021-10-10 NOTE — ED Notes (Signed)
Patient refusing food at this time.

## 2021-10-10 NOTE — ED Notes (Signed)
Patient aware of need for urine specimen.  Still unable to provide

## 2021-10-10 NOTE — H&P (Signed)
History and Physical    Patient: [name] YTK:[id number] DOB: 02/21/1929 DOA: 10/10/2021 DOS: the patient was seen and examined on 10/10/2021 PCP: [name], MD  Patient coming from: Home  Chief Complaint:  Chief Complaint  Patient presents with   Shortness of Breath   HPI: [name] is a 88 y.o. male with medical history significant of abdominal pain, resolved acute renal failure, osteoarthritis, ataxia, chronic atrial fibrillation on apixaban, bladder outlet obstruction, BPH, CAD, history of RCA DES, constipation, insulin requiring type II DM, epistaxis, GERD, hypertension, hyperlipidemia, nephrolithiasis, mild aortic stenosis, mild cognitive impairment, rib fractures, history of pneumonia, recently admitted and discharged from 10/04/2021 until 10/07/2021 for his latest episode of community-acquired pneumonia who is returning to the emergency department due to acute dyspnea associated with fatigue, lower extremity edema and orthopnea for the past 2 days. No chest pain, palpitations, diaphoresis.  He denied fever, chills, rhinorrhea, sore throat, wheezing or hemoptysis.  No abdominal pain, nausea, emesis, diarrhea, constipation, melena or hematochezia.  No flank pain, dysuria, frequency or hematuria.  No polyuria, polydipsia, polyphagia or blurred vision.   ED course: Initial vital signs were temperature 99.1 F, pulse 115, respiration 25, BP 143/78 mmHg and O2 sat 99% on 2 L via nasal cannula.  The patient received acetaminophen 650 mg p.o. ceftriaxone 1 g IVPB and I added magnesium sulfate plus oral KCl supplementation.  Lab work: CBC showed a white count of 13.4, hemoglobin 10.3 g/dL platelets 355.  PT was 17.2 INR was 1.4.  Venous blood gas showed a decreased PCO2 of 41 mmHg, but was otherwise normal.  Lactic acid was normal.  First troponin negative and BNP was 207.5 pg/mL.  CMP showed a potassium of 3.4 mmol/L, the rest of the electrolytes and renal function were normal once  calcium is corrected.  Glucose 143 mg/dL.  Total protein 6.2 and albumin 2.8 g/dL, the rest of the LFTs are normal.  Imaging: Portable 1 view chest radiograph showed improved right lung multilobar pneumonia since 6 days ago.  Cardiomegaly was stable no pneumothorax or pleural effusions seen.   Review of Systems: As mentioned in the history of present illness. All other systems reviewed and are negative. Past Medical History:  Diagnosis Date   Abdominal pain    Acute renal failure ([place])     resolved   Arthritis    "hands & legs" (11/'10/2014)   Ataxia    Atrial fibrillation (HCC)    Bladder outlet obstruction    Bladder outlet obstruction    BPH (benign prostatic hyperplasia)    CAD (coronary artery disease)    a. CABG IN 1989. b. 01/08/2015 CTO of ost LAD, LIMA to LAD not visualized but assumed patent given myoview finding, occluded SVG to diagonal, 99% mid RCA tx w/ SYNERGY DES 3X28 mm   Constipation    Diabetes mellitus type 2, insulin dependent ([place])    Epistaxis, recurrent Feb 2017   GERD (gastroesophageal reflux disease)    HTN (hypertension)    Hx of bacterial pneumonia    Hyperlipemia    Kidney stones    Mild aortic stenosis    Mild cognitive impairment 03/05/2021   MMSE 26/30 and 6CIT 9 03/2021 AWV   Pneumonia 04/2019   Rib fractures    left rib fractures being treated with pain medications   Stented coronary artery Nov 2016   RCA DES   Urinary tract infection     Enterococcus   Past Surgical History:  Procedure Laterality Date  [facility] N/A 01/08/2015   Procedure: Left Heart Cath and Cors/Grafts Angiography;  Surgeon: Peter M Martinique, MD;  Location: [facility];  Service: Cardiovascular;  Laterality: N/A;   CARDIAC CATHETERIZATION  01/08/2015   Procedure: Coronary Stent Intervention;  Surgeon: Peter M Martinique, MD;  Location: [facility];  Service: Cardiovascular;;   CARPAL TUNNEL RELEASE Right 08/2010    CATARACT EXTRACTION W/ INTRAOCULAR LENS  IMPLANT, BILATERAL Bilateral    CORONARY ANGIOPLASTY  01/08/15   RCA DES   CORONARY ARTERY BYPASS GRAFT  1989   "CABG X 2"   IR THORACENTESIS ASP PLEURAL SPACE W/IMG GUIDE  05/14/2019   JOINT REPLACEMENT     TONSILLECTOMY     TOTAL HIP ARTHROPLASTY Right 2000   Social History:  reports that he quit smoking about 63 years ago. His smoking use included cigarettes. He has a 30.00 pack-year smoking history. He has never used smokeless tobacco. He reports that he does not drink alcohol and does not use drugs.  Allergies  Allergen Reactions   Septra [Sulfamethoxazole-Trimethoprim] Itching   Cefadroxil Other (See Comments)    Unknown reaction    Ciprofloxacin Other (See Comments)    Unknown action     Erythromycin Other (See Comments)    Upset stomach     Family History  Problem Relation Age of Onset   Brain cancer Father    Throat cancer Brother     Prior to Admission medications   Medication Sig Start Date End Date Taking? Authorizing Provider  apixaban (ELIQUIS) 5 MG TABS tablet Take 1 tablet (5 mg total) by mouth 2 (two) times daily. 06/08/16   Reyne Dumas, MD  atorvastatin (LIPITOR) 40 MG tablet Take 1 tablet (40 mg total) by mouth daily at 6 PM. 06/08/16   Reyne Dumas, MD  AVODART 0.5 MG capsule Take 0.5 mg by mouth every Monday, Wednesday, and Friday.  07/20/11   [provider]  azithromycin (ZITHROMAX) 250 MG tablet Take 1 tab daily 10/08/21   Charlynne Cousins, MD  diltiazem [facility]) 360 MG 24 hr capsule Take 1 capsule by mouth daily. 11/08/19   [provider]  empagliflozin (JARDIANCE) 25 MG TABS tablet Take 1 tablet (25 mg total) by mouth daily before breakfast. 09/03/20   Martinique, Peter M, MD  fluticasone (FLONASE) 50 MCG/ACT nasal spray SHAKE LIQUID AND USE 1 SPRAY IN EACH NOSTRIL DAILY Patient taking differently: Place 1 spray into both nostrils daily. 05/20/21   [name], MD  furosemide (LASIX) 40 MG  tablet TAKE 1 TABLET(40 MG) BY MOUTH DAILY Patient taking differently: Take 40 mg by mouth daily. 07/28/21   Martinique, Peter M, MD  gabapentin (NEURONTIN) 300 MG capsule TAKE 1 CAPSULE(300 MG) BY MOUTH THREE TIMES DAILY Patient taking differently: Take 300 mg by mouth 3 (three) times daily. 11/24/20   [name], MD  insulin glargine (LANTUS) 100 UNIT/ML injection Inject 40 Units into the skin every morning.    [provider]  lidocaine (LIDODERM) 5 % Place 1 patch onto the skin daily. Remove & Discard patch within 12 hours or as directed by MD 09/29/21   Regan Lemming, MD  metFORMIN (GLUCOPHAGE) 500 MG tablet Take 500 mg by mouth 2 (two) times daily with a meal.    [provider]  metoprolol succinate (TOPROL-XL) 100 MG 24 hr tablet Take 1 tablet (100 mg total) by mouth daily. Take with or immediately following a meal.  Patient taking differently: Take 100 mg by mouth daily. 09/09/21   Martinique, Peter M, MD  nitroGLYCERIN (NITROSTAT) 0.4 MG SL tablet Place 0.4 mg under the tongue every 5 (five) minutes as needed for chest pain (x 3 doses).    [provider]  pantoprazole (PROTONIX) 40 MG tablet TAKE 1 TABLET BY MOUTH DAILY AT NOON Patient taking differently: Take 40 mg by mouth daily. 05/25/21   Martinique, Peter M, MD  potassium chloride SA (KLOR-CON) 20 MEQ tablet TAKE 1 TABLET(20 MEQ) BY MOUTH DAILY Patient taking differently: Take 20 mEq by mouth daily. 12/22/20   Martinique, Peter M, MD  tamsulosin (FLOMAX) 0.4 MG CAPS capsule Take 0.4 mg by mouth every other day. 03/19/21   [provider]    Physical Exam: Vitals:   10/10/21 0945 10/10/21 1015 10/10/21 1031 10/10/21 1100  BP: (!) 148/75 132/67 139/61 109/74  Pulse: 92 99 100 (!) 121  Resp: '13 14 20 18  '$ Temp:      TempSrc:      SpO2: 95% 94% 95% 96%  Weight:      Height:       Physical Exam Vitals and nursing note reviewed.  Constitutional:      Appearance: He is well-developed.  HENT:     Head:  Normocephalic.     Mouth/Throat:     Mouth: Mucous membranes are moist.  Eyes:     General: No scleral icterus.    Pupils: Pupils are equal, round, and reactive to light.  Neck:     Vascular: No JVD.  Cardiovascular:     Rate and Rhythm: Normal rate. Rhythm regularly irregular.     Heart sounds: S1 normal and S2 normal.  Pulmonary:     Effort: Pulmonary effort is normal.     Breath sounds: Normal breath sounds.  Abdominal:     General: Bowel sounds are normal. There is no distension.     Palpations: Abdomen is soft.     Tenderness: There is no abdominal tenderness. There is no guarding.  Musculoskeletal:     Cervical back: Neck supple.     Right lower leg: 1+ Pitting Edema present.     Left lower leg: 1+ Pitting Edema present.  Skin:    General: Skin is warm and dry.  Neurological:     General: No focal deficit present.     Mental Status: He is alert and oriented to person, place, and time.  Psychiatric:        Mood and Affect: Mood normal.        Behavior: Behavior normal. Behavior is cooperative.    Data Reviewed:  There are no new results to review at this time.  Assessment and Plan: Principal Problem:   Acute dyspnea In the setting of:   Acute on chronic diastolic congestive heart failure (HCC) Observation/telemetry. Continue supplemental oxygen.   Sodium and fluid restriction. Continue furosemide 20 mg IVP twice daily. Monitor daily weights, intake and output. Begin lisinopril 2.5 mg p.o. daily. No beta-blocker due to acute decompensation. Check echocardiogram in a.m. Cardiology will be seeing in the morning.    CAP (community acquired pneumonia) Continue ceftriaxone 1 g IVPB daily. Bronchodilators as needed.  Active Problems:   Diabetes mellitus with diabetic nephropathy,  with long-term current use of insulin (HCC) Carbohydrate modified diet. CBG monitoring before meals and bedtime.    Mixed hyperlipidemia   Chronic atrial fibrillation [facility])   CAD  S/P PCI- Nov 2016 CHA2DS2-VASc Score of at  least 6. Continue Cardizem and and Toprol-XL. Continue apixaban 5 mg p.o. twice daily.    Hypokalemia Replaced. Magnesium supplemented. Follow-up potassium level.    Normocytic anemia Monitor hematocrit and hemoglobin.    Advance Care Planning:   Code Status: Prior   Consults:   Family Communication:   Severity of Illness: The appropriate patient status for this patient is OBSERVATION. Observation status is judged to be reasonable and necessary in order to provide the required intensity of service to ensure the patient's safety. The patient's presenting symptoms, physical exam findings, and initial radiographic and laboratory data in the context of their medical condition is felt to place them at decreased risk for further clinical deterioration. Furthermore, it is anticipated that the patient will be medically stable for discharge from the hospital within 2 midnights of admission.   Author: Reubin Milan, MD 10/10/2021 11:24 AM  For on call review [email or web address].   This document was prepared using Dragon voice recognition software and may contain some unintended transcription errors.

## 2021-10-11 DIAGNOSIS — E782 Mixed hyperlipidemia: Secondary | ICD-10-CM | POA: Diagnosis present

## 2021-10-11 DIAGNOSIS — Z7901 Long term (current) use of anticoagulants: Secondary | ICD-10-CM | POA: Diagnosis not present

## 2021-10-11 DIAGNOSIS — M1909 Primary osteoarthritis, other specified site: Secondary | ICD-10-CM | POA: Diagnosis present

## 2021-10-11 DIAGNOSIS — J189 Pneumonia, unspecified organism: Secondary | ICD-10-CM | POA: Diagnosis present

## 2021-10-11 DIAGNOSIS — Z7984 Long term (current) use of oral hypoglycemic drugs: Secondary | ICD-10-CM | POA: Diagnosis not present

## 2021-10-11 DIAGNOSIS — N138 Other obstructive and reflux uropathy: Secondary | ICD-10-CM | POA: Diagnosis present

## 2021-10-11 DIAGNOSIS — E669 Obesity, unspecified: Secondary | ICD-10-CM | POA: Diagnosis present

## 2021-10-11 DIAGNOSIS — I482 Chronic atrial fibrillation, unspecified: Secondary | ICD-10-CM | POA: Diagnosis present

## 2021-10-11 DIAGNOSIS — I5033 Acute on chronic diastolic (congestive) heart failure: Secondary | ICD-10-CM

## 2021-10-11 DIAGNOSIS — R531 Weakness: Secondary | ICD-10-CM | POA: Diagnosis present

## 2021-10-11 DIAGNOSIS — M19042 Primary osteoarthritis, left hand: Secondary | ICD-10-CM | POA: Diagnosis present

## 2021-10-11 DIAGNOSIS — M19041 Primary osteoarthritis, right hand: Secondary | ICD-10-CM | POA: Diagnosis present

## 2021-10-11 DIAGNOSIS — Z6833 Body mass index (BMI) 33.0-33.9, adult: Secondary | ICD-10-CM | POA: Diagnosis not present

## 2021-10-11 DIAGNOSIS — E1121 Type 2 diabetes mellitus with diabetic nephropathy: Secondary | ICD-10-CM | POA: Diagnosis present

## 2021-10-11 DIAGNOSIS — Z20822 Contact with and (suspected) exposure to covid-19: Secondary | ICD-10-CM | POA: Diagnosis present

## 2021-10-11 DIAGNOSIS — Z794 Long term (current) use of insulin: Secondary | ICD-10-CM | POA: Diagnosis not present

## 2021-10-11 DIAGNOSIS — I110 Hypertensive heart disease with heart failure: Secondary | ICD-10-CM | POA: Diagnosis present

## 2021-10-11 DIAGNOSIS — I350 Nonrheumatic aortic (valve) stenosis: Secondary | ICD-10-CM | POA: Diagnosis present

## 2021-10-11 DIAGNOSIS — E1140 Type 2 diabetes mellitus with diabetic neuropathy, unspecified: Secondary | ICD-10-CM | POA: Diagnosis present

## 2021-10-11 DIAGNOSIS — I2510 Atherosclerotic heart disease of native coronary artery without angina pectoris: Secondary | ICD-10-CM | POA: Diagnosis present

## 2021-10-11 DIAGNOSIS — N401 Enlarged prostate with lower urinary tract symptoms: Secondary | ICD-10-CM | POA: Diagnosis present

## 2021-10-11 DIAGNOSIS — E876 Hypokalemia: Secondary | ICD-10-CM | POA: Diagnosis present

## 2021-10-11 DIAGNOSIS — D649 Anemia, unspecified: Secondary | ICD-10-CM | POA: Diagnosis present

## 2021-10-11 DIAGNOSIS — Z79899 Other long term (current) drug therapy: Secondary | ICD-10-CM | POA: Diagnosis not present

## 2021-10-11 DIAGNOSIS — K219 Gastro-esophageal reflux disease without esophagitis: Secondary | ICD-10-CM | POA: Diagnosis present

## 2021-10-11 LAB — BASIC METABOLIC PANEL
Anion gap: 9 (ref 5–15)
BUN: 17 mg/dL (ref 8–23)
CO2: 26 mmol/L (ref 22–32)
Calcium: 8.9 mg/dL (ref 8.9–10.3)
Chloride: 107 mmol/L (ref 98–111)
Creatinine, Ser: 1.12 mg/dL (ref 0.61–1.24)
GFR, Estimated: >60 mL/min (ref >60)
Glucose, Bld: 144 mg/dL — ABNORMAL HIGH (ref 70–99)
Potassium: 4 mmol/L (ref 3.5–5.1)
Sodium: 142 mmol/L (ref 135–145)

## 2021-10-11 LAB — CBC
HCT: 31.8 % — ABNORMAL LOW (ref 39.0–52.0)
Hemoglobin: 9.8 g/dL — ABNORMAL LOW (ref 13.0–17.0)
MCH: 25.1 pg — ABNORMAL LOW (ref 26.0–34.0)
MCHC: 30.8 g/dL (ref 30.0–36.0)
MCV: 81.5 fL (ref 80.0–100.0)
Platelets: 390 10*3/uL (ref 150–400)
RBC: 3.9 MIL/uL — ABNORMAL LOW (ref 4.22–5.81)
RDW: 17.3 % — ABNORMAL HIGH (ref 11.5–15.5)
WBC: 13.8 10*3/uL — ABNORMAL HIGH (ref 4.0–10.5)
nRBC: 0 % (ref 0.0–0.2)

## 2021-10-11 LAB — GLUCOSE, CAPILLARY
Glucose-Capillary: 148 mg/dL — ABNORMAL HIGH (ref 70–99)
Glucose-Capillary: 187 mg/dL — ABNORMAL HIGH (ref 70–99)

## 2021-10-11 MED ORDER — SODIUM CHLORIDE 0.9 % IV SOLN
3.0000 g | Freq: Four times a day (QID) | INTRAVENOUS | Status: DC
  Administered 2021-10-11 – 2021-10-12 (×5): 3 g via INTRAVENOUS
  Filled 2021-10-11 (×6): qty 8

## 2021-10-11 MED ORDER — INSULIN ASPART 100 UNIT/ML IJ SOLN
0.0000 [IU] | Freq: Three times a day (TID) | INTRAMUSCULAR | Status: DC
  Administered 2021-10-11: 1 [IU] via SUBCUTANEOUS
  Administered 2021-10-12: 3 [IU] via SUBCUTANEOUS

## 2021-10-11 NOTE — Progress Notes (Signed)
PROGRESS NOTE    [name]  KDX:[id number] DOB: [birth year] DOA: 10/10/2021 PCP: [name], MD     Brief Narrative:  [name] is a 88 y.o. male with medical history significant of abdominal pain, resolved acute renal failure, osteoarthritis, ataxia, chronic atrial fibrillation on apixaban, bladder outlet obstruction, BPH, CAD, history of RCA DES, constipation, insulin requiring type II DM, epistaxis, GERD, hypertension, hyperlipidemia, nephrolithiasis, mild aortic stenosis, mild cognitive impairment, rib fractures, history of pneumonia, who presents with chief complaint of leg swelling and shortness of breath.  He was previously seen in the emergency department on 8/1 with complaint of neck pain/neck mass.  He was diagnosed with lymphadenitis and discharged from the ER with Augmentin.  He presented to [facility] on 8/6 and was diagnosed with sepsis secondary to community-acquired pneumonia.  At that time, he was treated with IV antibiotics Rocephin and azithromycin and had clinical improvement.  He was discharged home to complete 7-day course of cefdinir and azithromycin.  He states that since returning home, he noted significant lower extremity edema.  He does take daily 40 mg Lasix at home.  He has not noticed any worsening or increase in urine output.  New events last 24 hours / Subjective: Patient states that he is feeling "100%, no 1000% better" today.   Assessment & Plan:   Principal Problem:   Acute on chronic diastolic congestive heart failure (HCC) Active Problems:   Essential (primary) hypertension   Diabetes mellitus with diabetic nephropathy, with long-term current use of insulin (HCC)   Mixed hyperlipidemia   Chronic atrial fibrillation (HCC)   CAD S/P PCI- Nov 2016   Multifocal pneumonia   Gastro-esophageal reflux disease without esophagitis   Obesity (BMI 30.0-34.9)   Acute dyspnea   Hypokalemia   Normocytic anemia   Multifocal  pneumonia -Patient completed 7-day course of cephalosporin/azithromycin from previous hospital stay.  Chest x-ray reviewed independently today, showing regressed but not resolved right multilobar pneumonia. -COVID and influenza negative -Question if he has any aspiration.  Family at bedside states that patient does have episodes of coughing and clearing his throat with his meals -SLP to evaluate swallow -Unasyn  Acute on chronic right heart failure -BNP 207.5 -Echocardiogram with EF 55 to 60%, no regional wall motion abnormality, increasing right ventricular pressure and volume overload.  No significant change from previous -Improved.  Continue Lasix, Jardiance -Fluid restriction diet, daily weights, strict I's and O's  Chronic A-fib -Eliquis, Cardizem, Toprol 100  Diabetes mellitus type 2 with neuropathy -Semglee, sliding scale insulin, Jardiance, gabapentin   Hyperlipidemia -Lipitor  Hypertension -Cardizem, lisinopril, Toprol  GERD -PPI  BPH -Dutasteride, tamsulosin  Obesity Estimated body mass index is 33.25 kg/m as calculated from the following:   Height as of this encounter: '5\' 6"'$  (1.676 m).   Weight as of this encounter: 93.4 kg.    DVT prophylaxis:  apixaban (ELIQUIS) tablet 5 mg  Code Status: Full Family Communication: Wife and daughter at bedside  Disposition Plan:  Status is: Inpatient Remains inpatient appropriate because: IV lasix    Antimicrobials:  Anti-infectives (From admission, onward)    Start     Dose/Rate Route Frequency Ordered Stop   10/11/21 1115  cefTRIAXone (ROCEPHIN) 2 g in sodium chloride 0.9 % 100 mL IVPB  Status:  Discontinued        2 g 200 mL/hr over 30 Minutes Intravenous Every 24 hours 10/10/21 1609 10/11/21 0736   10/11/21 1000  Ampicillin-Sulbactam (UNASYN) 3 g in sodium  chloride 0.9 % 100 mL IVPB        3 g 200 mL/hr over 30 Minutes Intravenous Every 6 hours 10/11/21 0745     10/10/21 1700  cefTRIAXone (ROCEPHIN) 1 g in  sodium chloride 0.9 % 100 mL IVPB        1 g 200 mL/hr over 30 Minutes Intravenous STAT 10/10/21 1609 10/11/21 1138   10/10/21 1115  cefTRIAXone (ROCEPHIN) 1 g in sodium chloride 0.9 % 100 mL IVPB  Status:  Discontinued        1 g 200 mL/hr over 30 Minutes Intravenous Every 24 hours 10/10/21 1108 10/10/21 1609   10/10/21 1115  azithromycin (ZITHROMAX) 500 mg in sodium chloride 0.9 % 250 mL IVPB        500 mg 250 mL/hr over 60 Minutes Intravenous  Once 10/10/21 1108 10/10/21 1232        Objective: Vitals:   10/11/21 0052 10/11/21 0418 10/11/21 0428 10/11/21 0924  BP: 123/78  (!) 124/57 115/62  Pulse: 91  88 94  Resp:   18   Temp: (!) 97.4 F (36.3 C)  98.1 F (36.7 C)   TempSrc: Oral  Oral   SpO2: 96%  97%   Weight:  93.4 kg    Height:        Intake/Output Summary (Last 24 hours) at 10/11/2021 1316 Last data filed at 10/11/2021 0400 Gross per 24 hour  Intake 970 ml  Output 2650 ml  Net -1680 ml   Filed Weights   10/10/21 0820 10/10/21 1600 10/11/21 0418  Weight: 95.3 kg 94.3 kg 93.4 kg    Examination:  General exam: Appears calm and comfortable  Respiratory system: Clear to auscultation. Respiratory effort normal. No respiratory distress. No conversational dyspnea.  On room air Cardiovascular system: S1 & S2 heard. No murmurs.  Bilateral nonpitting pedal edema. Gastrointestinal system: Abdomen is nondistended, soft and nontender. Normal bowel sounds heard. Central nervous system: Alert and oriented. No focal neurological deficits. Speech clear.  Extremities: Symmetric in appearance  Skin: No rashes, lesions or ulcers on exposed skin  Psychiatry: Judgement and insight appear normal. Mood & affect appropriate.   Data Reviewed: I have personally reviewed following labs and imaging studies  CBC: Recent Labs  Lab 10/05/21 0342 10/06/21 0425 10/07/21 0314 10/10/21 0858 10/11/21 0435  WBC 18.5* 19.2* 15.8* 13.4* 13.8*  NEUTROABS  --   --   --  10.1*  --   HGB 9.7*  9.7* 9.2* 10.3* 9.8*  HCT 31.7* 31.4* 29.7* 33.4* 31.8*  MCV 82.6 81.3 81.1 82.1 81.5  PLT 212 223 225 355 517   Basic Metabolic Panel: Recent Labs  Lab 10/05/21 0342 10/06/21 0425 10/10/21 0944 10/10/21 1124 10/11/21 0435  NA 137 138 141  --  142  K 3.4* 3.8 3.4*  --  4.0  CL 104 106 107  --  107  CO2 '26 22 24  '$ --  26  GLUCOSE 111* 76 143*  --  144*  BUN '20 15 16  '$ --  17  CREATININE 1.24 1.08 1.13  --  1.12  CALCIUM 8.7* 8.9 8.7*  --  8.9  MG  --  1.9  --  1.8  --   PHOS  --   --   --  4.0  --    GFR: Estimated Creatinine Clearance: 45.9 mL/min (by C-G formula based on SCr of 1.12 mg/dL). Liver Function Tests: Recent Labs  Lab 10/06/21 0425 10/10/21 0944  AST 16 32  ALT 12 33  ALKPHOS 57 68  BILITOT 1.1 1.0  PROT 5.8* 6.2*  ALBUMIN 2.9* 2.8*   No results for input(s): "LIPASE", "AMYLASE" in the last 168 hours. No results for input(s): "AMMONIA" in the last 168 hours. Coagulation Profile: Recent Labs  Lab 10/10/21 0858  INR 1.4*   Cardiac Enzymes: No results for input(s): "CKTOTAL", "CKMB", "CKMBINDEX", "TROPONINI" in the last 168 hours. BNP (last 3 results) No results for input(s): "PROBNP" in the last 8760 hours. HbA1C: No results for input(s): "HGBA1C" in the last 72 hours. CBG: Recent Labs  Lab 10/06/21 1631 10/06/21 2132 10/07/21 0815 10/10/21 1650 10/10/21 2019  GLUCAP 174* 170* 180* 151* 189*   Lipid Profile: No results for input(s): "CHOL", "HDL", "LDLCALC", "TRIG", "CHOLHDL", "LDLDIRECT" in the last 72 hours. Thyroid Function Tests: No results for input(s): "TSH", "T4TOTAL", "FREET4", "T3FREE", "THYROIDAB" in the last 72 hours. Anemia Panel: No results for input(s): "VITAMINB12", "FOLATE", "FERRITIN", "TIBC", "IRON", "RETICCTPCT" in the last 72 hours. Sepsis Labs: Recent Labs  Lab 10/10/21 0815  LATICACIDVEN 0.9    Recent Results (from the past 240 hour(s))  Culture, blood (Routine x 2)     Status: None   Collection Time: 10/04/21   4:22 AM   Specimen: BLOOD RIGHT HAND  Result Value Ref Range Status   Specimen Description BLOOD RIGHT HAND  Final   Special Requests   Final    BOTTLES DRAWN AEROBIC AND ANAEROBIC Blood Culture adequate volume   Culture   Final    NO GROWTH 5 DAYS Performed at [facility], [address], [place], [place] [address]    Report Status 10/09/2021 FINAL  Final  Resp Panel by RT-PCR (Flu A&B, Covid) Anterior Nasal Swab     Status: None   Collection Time: 10/04/21  4:25 AM   Specimen: Anterior Nasal Swab  Result Value Ref Range Status   SARS Coronavirus 2 by RT PCR NEGATIVE NEGATIVE Final    Comment: (NOTE) SARS-CoV-2 target nucleic acids are NOT DETECTED.  The SARS-CoV-2 RNA is generally detectable in upper respiratory specimens during the acute phase of infection. The lowest concentration of SARS-CoV-2 viral copies this assay can detect is 138 copies/mL. A negative result does not preclude SARS-Cov-2 infection and should not be used as the sole basis for treatment or other patient management decisions. A negative result may occur with  improper specimen collection/handling, submission of specimen other than nasopharyngeal swab, presence of viral mutation(s) within the areas targeted by this assay, and inadequate number of viral copies(<138 copies/mL). A negative result must be combined with clinical observations, patient history, and epidemiological information. The expected result is Negative.  Fact Sheet for Patients:  EntrepreneurPulse.com.au  Fact Sheet for Healthcare Providers:  IncredibleEmployment.be  This test is no t yet approved or cleared by the [place] FDA and  has been authorized for detection and/or diagnosis of SARS-CoV-2 by FDA under an Emergency Use Authorization (EUA). This EUA will remain  in effect (meaning this test can be used) for the duration of the COVID-19 declaration under Section 564(b)(1) of the Act,  21 U.S.C.section 360bbb-3(b)(1), unless the authorization is terminated  or revoked sooner.       Influenza A by PCR NEGATIVE NEGATIVE Final   Influenza B by PCR NEGATIVE NEGATIVE Final    Comment: (NOTE) The Xpert Xpress SARS-CoV-2/FLU/RSV plus assay is intended as an aid in the diagnosis of influenza from Nasopharyngeal swab specimens and should not be used as a sole basis  for treatment. Nasal washings and aspirates are unacceptable for Xpert Xpress SARS-CoV-2/FLU/RSV testing.  Fact Sheet for Patients: EntrepreneurPulse.com.au  Fact Sheet for Healthcare Providers: IncredibleEmployment.be  This test is not yet approved or cleared by the [place] FDA and has been authorized for detection and/or diagnosis of SARS-CoV-2 by FDA under an Emergency Use Authorization (EUA). This EUA will remain in effect (meaning this test can be used) for the duration of the COVID-19 declaration under Section 564(b)(1) of the Act, 21 U.S.C. section 360bbb-3(b)(1), unless the authorization is terminated or revoked.  Performed at [facility], [place] [address], [place], [place] [address]   Culture, blood (Routine x 2)     Status: None   Collection Time: 10/04/21  4:35 AM   Specimen: BLOOD  Result Value Ref Range Status   Specimen Description BLOOD SITE NOT SPECIFIED  Final   Special Requests   Final    BOTTLES DRAWN AEROBIC AND ANAEROBIC Blood Culture adequate volume   Culture   Final    NO GROWTH 5 DAYS Performed at [facility], [address] [place], [place] [address]    Report Status 10/09/2021 FINAL  Final  Culture, blood (routine x 2)     Status: None (Preliminary result)   Collection Time: 10/10/21  8:10 AM   Specimen: BLOOD  Result Value Ref Range Status   Specimen Description   Final    BLOOD BLOOD RIGHT FOREARM Performed at [facility] [address] [place], [place] [address]    Special Requests   Final     BOTTLES DRAWN AEROBIC AND ANAEROBIC Blood Culture results may not be optimal due to an inadequate volume of blood received in culture bottles Performed at [facility] [address], [place], [place] [address]    Culture   Final    NO GROWTH < 24 HOURS Performed at [facility] [address], [place], [place] [address]    Report Status PENDING  Incomplete  Culture, blood (routine x 2)     Status: None (Preliminary result)   Collection Time: 10/10/21  8:15 AM   Specimen: BLOOD  Result Value Ref Range Status   Specimen Description   Final    BLOOD BLOOD RIGHT HAND Performed at [facility] [address], [place], [place] [address]    Special Requests   Final    BOTTLES DRAWN AEROBIC AND ANAEROBIC Blood Culture results may not be optimal due to an inadequate volume of blood received in culture bottles Performed at [facility] [address] [place], [place] [address]    Culture   Final    NO GROWTH < 24 HOURS Performed at [facility] [address], [place],  [address]    Report Status PENDING  Incomplete  Resp Panel by RT-PCR (Flu A&B, Covid) Anterior Nasal Swab     Status: None   Collection Time: 10/10/21  8:32 AM   Specimen: Anterior Nasal Swab  Result Value Ref Range Status   SARS Coronavirus 2 by RT PCR NEGATIVE NEGATIVE Final    Comment: (NOTE) SARS-CoV-2 target nucleic acids are NOT DETECTED.  The SARS-CoV-2 RNA is generally detectable in upper respiratory specimens during the acute phase of infection. The lowest concentration of SARS-CoV-2 viral copies this assay can detect is 138 copies/mL. A negative result does not preclude SARS-Cov-2 infection and should not be used as the sole basis for treatment or other  patient management decisions. A negative result may occur with  improper specimen collection/handling, submission of specimen other than nasopharyngeal swab, presence of viral  mutation(s) within the areas targeted by this assay, and inadequate number of viral copies(<138 copies/mL). A negative result must be combined with clinical observations, patient history, and epidemiological information. The expected result is Negative.  Fact Sheet for Patients:  EntrepreneurPulse.com.au  Fact Sheet for Healthcare Providers:  IncredibleEmployment.be  This test is no t yet approved or cleared by the [place] FDA and  has been authorized for detection and/or diagnosis of SARS-CoV-2 by FDA under an Emergency Use Authorization (EUA). This EUA will remain  in effect (meaning this test can be used) for the duration of the COVID-19 declaration under Section 564(b)(1) of the Act, 21 U.S.C.section 360bbb-3(b)(1), unless the authorization is terminated  or revoked sooner.       Influenza A by PCR NEGATIVE NEGATIVE Final   Influenza B by PCR NEGATIVE NEGATIVE Final    Comment: (NOTE) The Xpert Xpress SARS-CoV-2/FLU/RSV plus assay is intended as an aid in the diagnosis of influenza from Nasopharyngeal swab specimens and should not be used as a sole basis for treatment. Nasal washings and aspirates are unacceptable for Xpert Xpress SARS-CoV-2/FLU/RSV testing.  Fact Sheet for Patients: EntrepreneurPulse.com.au  Fact Sheet for Healthcare Providers: IncredibleEmployment.be  This test is not yet approved or cleared by the [place] FDA and has been authorized for detection and/or diagnosis of SARS-CoV-2 by FDA under an Emergency Use Authorization (EUA). This EUA will remain in effect (meaning this test can be used) for the duration of the COVID-19 declaration under Section 564(b)(1) of the Act, 21 U.S.C. section 360bbb-3(b)(1), unless the authorization is terminated or revoked.  Performed at [facility], [place] [address], [place], [place] [address]       Radiology  Studies: ECHOCARDIOGRAM COMPLETE  Result Date: 10/10/2021    ECHOCARDIOGRAM REPORT   Patient Name:   EMILIAN STAWICKI Date of Exam: 10/10/2021 Medical Rec #:  [id number]       Height:       66.0 in Accession #:    [id number]      Weight:       210.0 lb Date of Birth:  27-Feb-1929       BSA:          2.042 m Patient Age:    18 years        BP:           123/69 mmHg Patient Gender: M               HR:           120 bpm. Exam Location:  Inpatient Procedure: 2D Echo, Cardiac Doppler and Color Doppler Indications:     Congestive Heart Failure I50.9  History:         Patient has prior history of Echocardiogram examinations, most                  recent 06/10/2020. CAD, Arrythmias:Atrial Fibrillation; Risk                  Factors:Diabetes, Dyslipidemia, Hypertension and GERD.  Sonographer:     [name] RDCS Referring Phys:  [id number] [place] Diagnosing Phys: Eleonore Chiquito MD IMPRESSIONS  1. Left ventricular ejection fraction, by estimation, is 55 to 60%. The left ventricle has normal function. The left ventricle has no regional wall motion abnormalities. There is moderate concentric left ventricular  hypertrophy. Left ventricular diastolic function could not be evaluated. There is the interventricular septum is flattened in systole and diastole, consistent with right ventricular pressure and volume overload.  2. Right ventricular systolic function is mildly reduced. The right ventricular size is mildly enlarged. There is normal pulmonary artery systolic pressure. The estimated right ventricular systolic pressure is 76.1 mmHg.  3. Left atrial size was mildly dilated.  4. The mitral valve is degenerative. Trivial mitral valve regurgitation. No evidence of mitral stenosis.  5. The aortic valve is tricuspid. There is moderate calcification of the aortic valve. There is moderate thickening of the aortic valve. Aortic valve regurgitation is trivial. Mild to moderate aortic valve stenosis. Aortic valve area, by VTI  measures 1.05 cm. Aortic valve mean gradient measures 19.3 mmHg. Aortic valve Vmax measures 2.92 m/s.  6. Asc aorta up to 40 mm on this study. 48 mm on last study. CT scan recorded 43 mm. Would defer to cross sectional imaging (CT). There is mild dilatation of the ascending aorta, measuring 40 mm.  7. The inferior vena cava is normal in size with greater than 50% respiratory variability, suggesting right atrial pressure of 3 mmHg. Comparison(s): No significant change from prior study. FINDINGS  Left Ventricle: Left ventricular ejection fraction, by estimation, is 55 to 60%. The left ventricle has normal function. The left ventricle has no regional wall motion abnormalities. The left ventricular internal cavity size was normal in size. There is  moderate concentric left ventricular hypertrophy. The interventricular septum is flattened in systole and diastole, consistent with right ventricular pressure and volume overload. Left ventricular diastolic function could not be evaluated due to atrial fibrillation. Left ventricular diastolic function could not be evaluated. Right Ventricle: The right ventricular size is mildly enlarged. No increase in right ventricular wall thickness. Right ventricular systolic function is mildly reduced. There is normal pulmonary artery systolic pressure. The tricuspid regurgitant velocity  is 1.78 m/s, and with an assumed right atrial pressure of 3 mmHg, the estimated right ventricular systolic pressure is 95.0 mmHg. Left Atrium: Left atrial size was mildly dilated. Right Atrium: Right atrial size was normal in size. Pericardium: There is no evidence of pericardial effusion. Mitral Valve: The mitral valve is degenerative in appearance. Mild to moderate mitral annular calcification. Trivial mitral valve regurgitation. No evidence of mitral valve stenosis. Tricuspid Valve: The tricuspid valve is grossly normal. Tricuspid valve regurgitation is trivial. No evidence of tricuspid stenosis.  Aortic Valve: The aortic valve is tricuspid. There is moderate calcification of the aortic valve. There is moderate thickening of the aortic valve. Aortic valve regurgitation is trivial. Aortic regurgitation PHT measures 536 msec. Mild to moderate aortic  stenosis is present. Aortic valve mean gradient measures 19.3 mmHg. Aortic valve peak gradient measures 34.2 mmHg. Aortic valve area, by VTI measures 1.05 cm. Pulmonic Valve: The pulmonic valve was grossly normal. Pulmonic valve regurgitation is trivial. No evidence of pulmonic stenosis. Aorta: Asc aorta up to 40 mm on this study. 48 mm on last study. CT scan recorded 43 mm. Would defer to cross sectional imaging (CT). The aortic root is normal in size and structure. There is mild dilatation of the ascending aorta, measuring 40 mm. Venous: The inferior vena cava is normal in size with greater than 50% respiratory variability, suggesting right atrial pressure of 3 mmHg. IAS/Shunts: The atrial septum is grossly normal.  LEFT VENTRICLE PLAX 2D LVIDd:         4.50 cm LVIDs:  3.40 cm LV PW:         1.40 cm LV IVS:        1.60 cm LVOT diam:     2.10 cm LV SV:         64 LV SV Index:   32 LVOT Area:     3.46 cm  LV Volumes (MOD) LV vol d, MOD A2C: 87.7 ml LV vol d, MOD A4C: 80.9 ml LV vol s, MOD A2C: 43.6 ml LV vol s, MOD A4C: 35.1 ml LV SV MOD A2C:     44.1 ml LV SV MOD A4C:     80.9 ml LV SV MOD BP:      46.3 ml RIGHT VENTRICLE RV S prime:     10.50 cm/s TAPSE (M-mode): 2.0 cm LEFT ATRIUM             Index        RIGHT ATRIUM           Index LA diam:        4.80 cm 2.35 cm/m   RA Area:     25.70 cm LA Vol (A2C):   55.9 ml 27.38 ml/m  RA Volume:   81.00 ml  39.67 ml/m LA Vol (A4C):   52.7 ml 25.81 ml/m LA Biplane Vol: 59.5 ml 29.14 ml/m  AORTIC VALVE AV Area (Vmax):    1.25 cm AV Area (Vmean):   1.18 cm AV Area (VTI):     1.05 cm AV Vmax:           292.33 cm/s AV Vmean:          204.667 cm/s AV VTI:            0.616 m AV Peak Grad:      34.2 mmHg AV  Mean Grad:      19.3 mmHg LVOT Vmax:         105.23 cm/s LVOT Vmean:        69.433 cm/s LVOT VTI:          0.186 m LVOT/AV VTI ratio: 0.30 AI PHT:            536 msec AR Vena Contracta: 0.30 cm  AORTA Ao Root diam: 3.50 cm Ao Asc diam:  4.00 cm TRICUSPID VALVE TR Peak grad:   12.7 mmHg TR Vmax:        178.00 cm/s  SHUNTS Systemic VTI:  0.19 m Systemic Diam: 2.10 cm Eleonore Chiquito MD Electronically signed by Eleonore Chiquito MD Signature Date/Time: 10/10/2021/3:17:09 PM    Final (Updated)    DG Chest Port 1 View  Result Date: 10/10/2021 CLINICAL DATA:  88 year old male with sepsis. Multilobar right lung pneumonia. EXAM: PORTABLE CHEST 1 VIEW COMPARISON:  CTA chest 10/04/2021 and earlier. FINDINGS: Portable AP semi upright view at 0842 hours. Improved lung volumes. Regressed but not resolved multilobar right lung opacity since 10/04/2021. Most confluent residual is in the upper lobe. Stable cardiomegaly and mediastinal contours. Prior CABG. Left lung appears negative. No pneumothorax or pleural effusion. No acute osseous abnormality identified. IMPRESSION: 1. Regressed but not resolved right lung Multilobar Pneumonia since 10/04/2021. 2. No new cardiopulmonary abnormality. Electronically Signed   By: Genevie Ann M.D.   On: 10/10/2021 09:03      Scheduled Meds:  apixaban  5 mg Oral BID   atorvastatin  40 mg Oral q1800   diltiazem  360 mg Oral Daily   [START ON 10/12/2021] dutasteride  0.5 mg Oral Q M,W,F  empagliflozin  25 mg Oral QAC breakfast   furosemide  20 mg Intravenous Daily   gabapentin  300 mg Oral TID   insulin aspart  0-9 Units Subcutaneous TID WC   insulin glargine-yfgn  40 Units Subcutaneous Daily   lisinopril  2.5 mg Oral Daily   melatonin  3 mg Oral QHS   metoprolol succinate  100 mg Oral Daily   pantoprazole  40 mg Oral Q1200   potassium chloride SA  20 mEq Oral Daily   tamsulosin  0.4 mg Oral QODAY   Continuous Infusions:  ampicillin-sulbactam (UNASYN) IV 3 g (10/11/21 1201)      LOS: 0 days     Dessa Phi, DO [facility] 10/11/2021, 1:16 PM   Available via Epic secure chat 7am-7pm After these hours, please refer to coverage provider listed on [email or web address]

## 2021-10-11 NOTE — Evaluation (Signed)
Clinical/Bedside Swallow Evaluation Patient Details  Name: [name] MRN: [id number] Date of Birth: Feb 22, 1929  Today's Date: 10/11/2021 Time: SLP Start Time (ACUTE ONLY): 15 SLP Stop Time (ACUTE ONLY): 1645 SLP Time Calculation (min) (ACUTE ONLY): 20 min  Past Medical History:  Past Medical History:  Diagnosis Date   Abdominal pain    Acute renal failure ([place])     resolved   Arthritis    "hands & legs" (11/'10/2014)   Ataxia    Atrial fibrillation (HCC)    Bladder outlet obstruction    Bladder outlet obstruction    BPH (benign prostatic hyperplasia)    CAD (coronary artery disease)    a. CABG IN 1989. b. 01/08/2015 CTO of ost LAD, LIMA to LAD not visualized but assumed patent given myoview finding, occluded SVG to diagonal, 99% mid RCA tx w/ SYNERGY DES 3X28 mm   Constipation    Diabetes mellitus type 2, insulin dependent ([place])    Epistaxis, recurrent Feb 2017   GERD (gastroesophageal reflux disease)    HTN (hypertension)    Hx of bacterial pneumonia    Hyperlipemia    Kidney stones    Mild aortic stenosis    Mild cognitive impairment 03/05/2021   MMSE 26/30 and 6CIT 9 03/2021 AWV   Pneumonia 04/2019   Rib fractures    left rib fractures being treated with pain medications   Stented coronary artery Nov 2016   RCA DES   Urinary tract infection     Enterococcus   Past Surgical History:  Past Surgical History:  Procedure Laterality Date   [place] N/A 01/08/2015   Procedure: Left Heart Cath and Cors/Grafts Angiography;  Surgeon: Peter M Martinique, MD;  Location: [place] [facility];  Service: Cardiovascular;  Laterality: N/A;   CARDIAC CATHETERIZATION  01/08/2015   Procedure: Coronary Stent Intervention;  Surgeon: Peter M Martinique, MD;  Location: [place] [facility];  Service: Cardiovascular;;   CARPAL TUNNEL RELEASE Right 08/2010   CATARACT EXTRACTION W/ INTRAOCULAR LENS  IMPLANT, BILATERAL Bilateral    CORONARY ANGIOPLASTY   01/08/15   RCA DES   CORONARY ARTERY BYPASS GRAFT  1989   "CABG X 2"   IR THORACENTESIS ASP PLEURAL SPACE W/IMG GUIDE  05/14/2019   JOINT REPLACEMENT     TONSILLECTOMY     TOTAL HIP ARTHROPLASTY Right 2000   HPI:  Patient is a 88 y.o. male with PMH: GERD (on PPI), abdominal pain, resolved acute renal failure, osteoarthritis, ataxia, chronic atrial fibrillation on apixaban, bladder outlet obstruction, BPH, CAD, history of RCA DES, constipation, insulin requiring type II DM, epistaxis, hypertension, hyperlipidemia, nephrolithiasis, mild aortic stenosis, mild cognitive impairment, rib fractures, history of pneumonia. He presented to the hospital on 10/10/21 with c/o leg swelling and SOB. CXR showed Regressed but not resolved right lung Multilobar Pneumonia since 10/04/2021. SLP evaluation of swallow ordered secondary to family reporting patient has episodes of coughing and clearing his throat with his meals.    Assessment / Plan / Recommendation  Clinical Impression  Patient presents with a suspected mild esophageal dyspahgia caused by his h/o of GERD, however cannot r/o possibility of pharyngeal phase dysphagia. Patient exhibited throat clearing even before PO's which also occured after PO's. He did appear slightly SOB after consuming successive straw sips of thin liquids (water) but no change in vocal quality or vitals on monitor. SLP discussed evaluation with patient and his spouse and also gave rationale for potentially doing an MBS.  Patient and wife in agreement with plan. SLP secure message chatted with patient's MD who is in agreement with MBS next date. SLP Visit Diagnosis: Dysphagia, unspecified (R13.10)    Aspiration Risk  Mild aspiration risk    Diet Recommendation Regular;Thin liquid   Liquid Administration via: Cup;Straw Medication Administration: Whole meds with liquid Supervision: Patient able to self feed Compensations: Slow rate;Small sips/bites Postural Changes: Remain upright  for at least 30 minutes after po intake;Seated upright at 90 degrees    Other  Recommendations Oral Care Recommendations: Oral care BID;Patient independent with oral care    Recommendations for follow up therapy are one component of a multi-disciplinary discharge planning process, led by the attending physician.  Recommendations may be updated based on patient status, additional functional criteria and insurance authorization.  Follow up Recommendations No SLP follow up      Assistance Recommended at Discharge None  Functional Status Assessment Patient has had a recent decline in their functional status and demonstrates the ability to make significant improvements in function in a reasonable and predictable amount of time.  Frequency and Duration min 1 x/week  1 week       Prognosis Prognosis for Safe Diet Advancement: Good      Swallow Study   General Date of Onset: 10/10/21 HPI: Patient is a 88 y.o. male with PMH: GERD (on PPI), abdominal pain, resolved acute renal failure, osteoarthritis, ataxia, chronic atrial fibrillation on apixaban, bladder outlet obstruction, BPH, CAD, history of RCA DES, constipation, insulin requiring type II DM, epistaxis, hypertension, hyperlipidemia, nephrolithiasis, mild aortic stenosis, mild cognitive impairment, rib fractures, history of pneumonia. He presented to the hospital on 10/10/21 with c/o leg swelling and SOB. CXR showed Regressed but not resolved right lung Multilobar Pneumonia since 10/04/2021. SLP evaluation of swallow ordered secondary to family reporting patient has episodes of coughing and clearing his throat with his meals. Type of Study: Bedside Swallow Evaluation Previous Swallow Assessment: BSE during previous admission 2021 Diet Prior to this Study: Regular;Thin liquids Temperature Spikes Noted: No Respiratory Status: Room air History of Recent Intubation: No Behavior/Cognition: Alert;Cooperative;Pleasant mood Oral Cavity Assessment:  Within Functional Limits Oral Care Completed by SLP: No Oral Cavity - Dentition: Adequate natural dentition Vision: Functional for self-feeding Self-Feeding Abilities: Able to feed self Patient Positioning: Upright in bed Baseline Vocal Quality: Normal Volitional Cough: Strong Volitional Swallow: Able to elicit    Oral/Motor/Sensory Function Overall Oral Motor/Sensory Function: Within functional limits   Ice Chips     Thin Liquid Thin Liquid: Impaired Presentation: Straw;Self Fed Pharyngeal  Phase Impairments: Throat Clearing - Delayed Other Comments: throat clearing persisted and did occur prior to PO's given as well as after    Nectar Thick     Honey Thick     Puree Puree: Not tested   Solid     Solid: Not tested      Sonia Baller, MA, CCC-SLP Speech Therapy

## 2021-10-11 NOTE — Plan of Care (Signed)
  Problem: Education: Goal: Knowledge of General Education information will improve Description: Including pain rating scale, medication(s)/side effects and non-pharmacologic comfort measures 10/11/2021 1533 by Jerene Pitch, RN Outcome: Progressing 10/11/2021 1533 by Jerene Pitch, RN Outcome: Progressing   Problem: Health Behavior/Discharge Planning: Goal: Ability to manage health-related needs will improve 10/11/2021 1533 by Jerene Pitch, RN Outcome: Progressing 10/11/2021 1533 by Jerene Pitch, RN Outcome: Progressing

## 2021-10-11 NOTE — Progress Notes (Signed)
Pharmacy Antibiotic Note  [name] is a 88 y.o. male admitted on 10/10/2021 with  aspiration pneumonia .  Pharmacy has been consulted for unasyn dosing.  Plan: Unasyn 3g IV q6 Will sign off  Height: '5\' 6"'$  (167.6 cm) Weight: 93.4 kg (206 lb) IBW/kg (Calculated) : 63.8  Temp (24hrs), Avg:98.4 F (36.9 C), Min:97.4 F (36.3 C), Max:99.1 F (37.3 C)  Recent Labs  Lab 10/05/21 0342 10/06/21 0425 10/07/21 0314 10/10/21 0815 10/10/21 0858 10/10/21 0944 10/11/21 0435  WBC 18.5* 19.2* 15.8*  --  13.4*  --  13.8*  CREATININE 1.24 1.08  --   --   --  1.13 1.12  LATICACIDVEN  --   --   --  0.9  --   --   --     Estimated Creatinine Clearance: 45.9 mL/min (by C-G formula based on SCr of 1.12 mg/dL).    Allergies  Allergen Reactions   Septra [Sulfamethoxazole-Trimethoprim] Itching   Cefadroxil Other (See Comments)    Unknown reaction    Ciprofloxacin Other (See Comments)    Unknown action     Erythromycin Other (See Comments)    Upsets the stomach     Thank you for allowing pharmacy to be a part of this patient's care.  Kara Mead 10/11/2021 7:44 AM

## 2021-10-11 NOTE — Evaluation (Addendum)
Physical Therapy Evaluation Patient Details Name: [name] MRN: [id number] DOB: 12/14/1928 Today's Date: 10/11/2021  History of Present Illness  88 yo male admitted with Pna, CHF. Hx of CABG, Afib, DM, CAD, OA, aortic stenosis  Clinical Impression  On eval, pt was Min guard A for mobility. He walked ~75 feet with a RW. O2 90% on RA, HR 101 bpm. Pt tolerated activity fairly well. Noticed some dyspnea after walking ~50 feet. Discussed d/c plan-pt plans to return home where he lives with family. Recommend HHPT f/u. Will plan to follow pt during this hospital stay.      Recommendations for follow up therapy are one component of a multi-disciplinary discharge planning process, led by the attending physician.  Recommendations may be updated based on patient status, additional functional criteria and insurance authorization.  Follow Up Recommendations Home health PT      Assistance Recommended at Discharge PRN  Patient can return home with the following  Assistance with cooking/housework;Assist for transportation;Help with stairs or ramp for entrance;A little help with bathing/dressing/bathroom    Equipment Recommendations None recommended by PT  Recommendations for Other Services       Functional Status Assessment Patient has had a recent decline in their functional status and demonstrates the ability to make significant improvements in function in a reasonable and predictable amount of time.     Precautions / Restrictions Precautions Precautions: Fall Precaution Comments: monitor HR, O2. Prefers to wear shoes Restrictions Weight Bearing Restrictions: No      Mobility  Bed Mobility Overal bed mobility: Needs Assistance Bed Mobility: Supine to Sit     Supine to sit: Supervision, HOB elevated     General bed mobility comments: Increased time.    Transfers Overall transfer level: Needs assistance Equipment used: Rolling walker (2 wheels) Transfers: Sit to/from  Stand Sit to Stand: Min guard, From elevated surface           General transfer comment: Increased time, rocking technique. Cues for safety, hand placement.    Ambulation/Gait Ambulation/Gait assistance: Min guard Gait Distance (Feet): 75 Feet Assistive device: Rolling walker (2 wheels) Gait Pattern/deviations: Step-through pattern, Decreased stride length       General Gait Details: Min guard for safety. Dyspnea 2/4 (began after ~50 feet). O2 90% on RA, HR 101.  Stairs            Wheelchair Mobility    Modified Rankin (Stroke Patients Only)       Balance Overall balance assessment: Needs assistance         Standing balance support: Bilateral upper extremity supported, During functional activity, Reliant on assistive device for balance Standing balance-Leahy Scale: Fair                               Pertinent Vitals/Pain Pain Assessment Pain Assessment: No/denies pain    Home Living Family/patient expects to be discharged to:: Private residence Living Arrangements: Spouse/significant other Available Help at Discharge: Family;Available 24 hours/day Type of Home: House Home Access: Level entry       Home Layout: One level Home Equipment: Conservation officer, nature (2 wheels);Cane - single point;Shower seat;Grab bars - toilet;Grab bars - tub/shower      Prior Function Prior Level of Function : Needs assist;Driving             Mobility Comments: amb without assistive device ADLs Comments: wife assists with socks     Hand Dominance  Dominant Hand: Left    Extremity/Trunk Assessment   Upper Extremity Assessment Upper Extremity Assessment: Generalized weakness    Lower Extremity Assessment Lower Extremity Assessment: Generalized weakness    Cervical / Trunk Assessment Cervical / Trunk Assessment: Normal  Communication   Communication: No difficulties;HOH  Cognition Arousal/Alertness: Awake/alert Behavior During Therapy: WFL for  tasks assessed/performed Overall Cognitive Status: Within Functional Limits for tasks assessed                                          General Comments      Exercises     Assessment/Plan    PT Assessment Patient needs continued PT services  PT Problem List Decreased strength;Decreased activity tolerance;Decreased mobility       PT Treatment Interventions DME instruction;Gait training;Functional mobility training;Therapeutic activities;Therapeutic exercise;Balance training;Patient/family education    PT Goals (Current goals can be found in the Care Plan section)  Acute Rehab PT Goals Patient Stated Goal: go home PT Goal Formulation: With patient Time For Goal Achievement: 10/25/21 Potential to Achieve Goals: Good    Frequency Min 3X/week     Co-evaluation               AM-PAC PT "6 Clicks" Mobility  Outcome Measure Help needed turning from your back to your side while in a flat bed without using bedrails?: None Help needed moving from lying on your back to sitting on the side of a flat bed without using bedrails?: None Help needed moving to and from a bed to a chair (including a wheelchair)?: A Little Help needed standing up from a chair using your arms (e.g., wheelchair or bedside chair)?: A Little Help needed to walk in hospital room?: A Little Help needed climbing 3-5 steps with a railing? : A Little 6 Click Score: 20    End of Session Equipment Utilized During Treatment: Gait belt Activity Tolerance: Patient tolerated treatment well Patient left: in chair;with call bell/phone within reach;with family/visitor present   PT Visit Diagnosis: Muscle weakness (generalized) (M62.81);Difficulty in walking, not elsewhere classified (R26.2)    Time: 4098-1191 PT Time Calculation (min) (ACUTE ONLY): 14 min   Charges:   PT Evaluation $PT Eval Low Complexity: [place], PT Acute Rehabilitation  Office:  [phone] Pager: [phone]

## 2021-10-12 ENCOUNTER — Telehealth: Payer: Self-pay | Admitting: *Deleted

## 2021-10-12 ENCOUNTER — Inpatient Hospital Stay (HOSPITAL_COMMUNITY): Payer: Medicare Other

## 2021-10-12 DIAGNOSIS — I5033 Acute on chronic diastolic (congestive) heart failure: Secondary | ICD-10-CM | POA: Diagnosis not present

## 2021-10-12 LAB — BASIC METABOLIC PANEL
Anion gap: 8 (ref 5–15)
BUN: 19 mg/dL (ref 8–23)
CO2: 27 mmol/L (ref 22–32)
Calcium: 8.5 mg/dL — ABNORMAL LOW (ref 8.9–10.3)
Chloride: 107 mmol/L (ref 98–111)
Creatinine, Ser: 1.07 mg/dL (ref 0.61–1.24)
GFR, Estimated: >60 mL/min (ref >60)
Glucose, Bld: 136 mg/dL — ABNORMAL HIGH (ref 70–99)
Potassium: 4.1 mmol/L (ref 3.5–5.1)
Sodium: 142 mmol/L (ref 135–145)

## 2021-10-12 LAB — CBC
HCT: 29.5 % — ABNORMAL LOW (ref 39.0–52.0)
Hemoglobin: 9 g/dL — ABNORMAL LOW (ref 13.0–17.0)
MCH: 24.9 pg — ABNORMAL LOW (ref 26.0–34.0)
MCHC: 30.5 g/dL (ref 30.0–36.0)
MCV: 81.7 fL (ref 80.0–100.0)
Platelets: 381 10*3/uL (ref 150–400)
RBC: 3.61 MIL/uL — ABNORMAL LOW (ref 4.22–5.81)
RDW: 17.5 % — ABNORMAL HIGH (ref 11.5–15.5)
WBC: 10.3 10*3/uL (ref 4.0–10.5)
nRBC: 0 % (ref 0.0–0.2)

## 2021-10-12 LAB — GLUCOSE, CAPILLARY
Glucose-Capillary: 117 mg/dL — ABNORMAL HIGH (ref 70–99)
Glucose-Capillary: 222 mg/dL — ABNORMAL HIGH (ref 70–99)

## 2021-10-12 MED ORDER — FUROSEMIDE 40 MG PO TABS: ORAL_TABLET | ORAL | 0 refills | Status: DC

## 2021-10-12 MED ORDER — AMOXICILLIN-POT CLAVULANATE 875-125 MG PO TABS: 1.0000 | ORAL_TABLET | Freq: Two times a day (BID) | ORAL | 0 refills | Status: AC

## 2021-10-12 NOTE — TOC Transition Note (Signed)
Transition of Care [facility]) - CM/SW Discharge Note   Patient Details  Name: [name] MRN: [id number] Date of Birth: 1928-09-30  Transition of Care [facility]) CM/SW Contact:  Roseanne Kaufman, RN Phone Number: 10/12/2021, 11:57 AM   Clinical Narrative:   RNCM spoke with patient's wife: [name] and daughter [name] re: [facility] services. [facility] was set up on previous d/c family will continue with [facility] set up. No additional TOC needs at this time.Patient's daughter [name] will pick patient up and request a call when patient is ready for puck up( lives 15 mins away).    Final next level of care: [place] Barriers to Discharge: No Barriers Identified   Patient Goals and CMS Choice Patient states their goals for this hospitalization and ongoing recovery are:: return home      Discharge Placement  D/c home with hh services                      Discharge Plan and Services   Discharge Planning Services: CM Consult Post Acute Care Choice: Home Health                    HH Arranged: PT [place] Agency: Other - See comment ([place])     Representative spoke with at [place]: [place] Determinants of Health ([place]) Interventions     Readmission Risk Interventions     No data to display

## 2021-10-12 NOTE — Plan of Care (Signed)
  Problem: Education: Goal: Knowledge of General Education information will improve Description Including pain rating scale, medication(s)/side effects and non-pharmacologic comfort measures Outcome: Progressing   Problem: Health Behavior/Discharge Planning: Goal: Ability to manage health-related needs will improve Outcome: Progressing   

## 2021-10-12 NOTE — Discharge Summary (Signed)
Physician Discharge Summary  [name] HBZ:[id number] DOB: Jul 04, 1928 DOA: 10/10/2021  PCP: [name], MD  Admit date: 10/10/2021 Discharge date: 10/12/2021  Admitted From: Home Disposition:  Home Health   Recommendations for Outpatient Follow-up:  Follow up with PCP in 1 week Recommend follow-up chest imaging to ensure resolution of multifocal pneumonia  Discharge Condition: Stable, improved CODE STATUS: Full code Diet recommendation: Heart healthy diet  Brief/Interim Summary: [name] is a 88 y.o. male with medical history significant of abdominal pain, resolved acute renal failure, osteoarthritis, ataxia, chronic atrial fibrillation on apixaban, bladder outlet obstruction, BPH, CAD, history of RCA DES, constipation, insulin requiring type II DM, epistaxis, GERD, hypertension, hyperlipidemia, nephrolithiasis, mild aortic stenosis, mild cognitive impairment, rib fractures, history of pneumonia, who presents with chief complaint of leg swelling and shortness of breath.   He was previously seen in the emergency department on 8/1 with complaint of neck pain/neck mass.  He was diagnosed with lymphadenitis and discharged from the ER with Augmentin.   He presented to [facility] on 8/6 and was diagnosed with sepsis secondary to community-acquired pneumonia.  At that time, he was treated with IV antibiotics Rocephin and azithromycin and had clinical improvement.  He was discharged home to complete 7-day course of cefdinir and azithromycin.   He states that since returning home, he noted significant lower extremity edema.  He does take daily 40 mg Lasix at home.  He has not noticed any worsening or increase in urine output.  Due to concern for persistent multifocal pneumonia, patient underwent MBS.  This was negative for aspiration concerns.  Patient was treated with Unasyn which was transitioned to Augmentin for discharge.  He was also discharged to continue his home Lasix  with additional dose in the afternoon as needed for fluid retention.  He worked with physical therapy and recommended for home health.  Discharge Diagnoses:   Principal Problem:   Acute on chronic diastolic congestive heart failure ([place]) Active Problems:   Essential (primary) hypertension   Diabetes mellitus with diabetic nephropathy, with long-term current use of insulin (HCC)   Mixed hyperlipidemia   Chronic atrial fibrillation (HCC)   CAD S/P PCI- Nov 2016   Multifocal pneumonia   Gastro-esophageal reflux disease without esophagitis   Obesity (BMI 30.0-34.9)   Acute dyspnea   Hypokalemia   Normocytic anemia      Acute on chronic right heart failure -BNP 207.5 -Echocardiogram with EF 55 to 60%, no regional wall motion abnormality, increasing right ventricular pressure and volume overload.  No significant change from previous -Improved.  Continue Lasix, Jardiance -Fluid restriction diet, daily weights, strict I's and O's -Added PRN second dose of lasix to take for fluid/edema   Multifocal pneumonia -Patient completed 7-day course of cephalosporin/azithromycin from previous hospital stay.  Chest x-ray reviewed independently, showing regressed but not resolved right multilobar pneumonia. -COVID and influenza negative -SLP eval and MBS negative  -Unasyn --> Augmentin  -Leukocytosis resolved    Chronic A-fib -Eliquis, Cardizem, Toprol    Diabetes mellitus type 2 with neuropathy -Semglee, sliding scale insulin, Jardiance, gabapentin    Hyperlipidemia -Lipitor   Hypertension -Cardizem, lisinopril, Toprol   GERD -PPI   BPH -Dutasteride, tamsulosin   Obesity Estimated body mass index is 33.25 kg/m as calculated from the following:   Height as of this encounter: '5\' 6"'$  (1.676 m).   Weight as of this encounter: 93.4 kg.  Discharge Instructions  Discharge Instructions     (HEART FAILURE PATIENTS) Call MD:  Anytime you have any of the following symptoms: 1) 3 pound  weight gain in 24 hours or 5 pounds in 1 week 2) shortness of breath, with or without a dry hacking cough 3) swelling in the hands, feet or stomach 4) if you have to sleep on extra pillows at night in order to breathe.   Complete by: As directed    Call MD for:  difficulty breathing, headache or visual disturbances   Complete by: As directed    Call MD for:  extreme fatigue   Complete by: As directed    Call MD for:  persistant dizziness or light-headedness   Complete by: As directed    Call MD for:  persistant nausea and vomiting   Complete by: As directed    Call MD for:  severe uncontrolled pain   Complete by: As directed    Call MD for:  temperature >100.4   Complete by: As directed    Diet - low sodium heart healthy   Complete by: As directed    Discharge instructions   Complete by: As directed    You were cared for by a hospitalist during your hospital stay. If you have any questions about your discharge medications or the care you received while you were in the hospital after you are discharged, you can call the unit and ask to speak with the hospitalist on call if the hospitalist that took care of you is not available. Once you are discharged, your primary care physician will handle any further medical issues. Please note that NO REFILLS for any discharge medications will be authorized once you are discharged, as it is imperative that you return to your primary care physician (or establish a relationship with a primary care physician if you do not have one) for your aftercare needs so that they can reassess your need for medications and monitor your lab values.   Increase activity slowly   Complete by: As directed       Allergies as of 10/12/2021       Reactions   Septra [sulfamethoxazole-trimethoprim] Itching   Cefadroxil Other (See Comments)   Unknown reaction    Ciprofloxacin Other (See Comments)   Unknown action    Erythromycin Other (See Comments)   Upsets the stomach         Medication List     STOP taking these medications    azithromycin 250 MG tablet Commonly known as: ZITHROMAX   fluticasone 50 MCG/ACT nasal spray Commonly known as: FLONASE   lidocaine 5 % Commonly known as: Lidoderm       TAKE these medications    amoxicillin-clavulanate 875-125 MG tablet Commonly known as: AUGMENTIN Take 1 tablet by mouth 2 (two) times daily for 3 days.   apixaban 5 MG Tabs tablet Commonly known as: ELIQUIS Take 1 tablet (5 mg total) by mouth 2 (two) times daily.   atorvastatin 40 MG tablet Commonly known as: LIPITOR Take 1 tablet (40 mg total) by mouth daily at 6 PM.   Avodart 0.5 MG capsule Generic drug: dutasteride Take 0.5 mg by mouth See admin instructions. Take 0.5 mg by mouth every other night- at bedtime   diltiazem 360 MG 24 hr capsule Commonly known as: TIAZAC Take 360 mg by mouth daily.   empagliflozin 25 MG Tabs tablet Commonly known as: JARDIANCE Take 1 tablet (25 mg total) by mouth daily before breakfast.   furosemide 40 MG tablet Commonly known as: LASIX Take 1 tablet (40 mg  total) by mouth every morning. May also take 1 tablet (40 mg total) daily as needed for fluid or edema (can take second dose in the afternoon (at least 6 hours after morning dose) for fluid or swelling). TAKE 1 TABLET(40 MG) BY MOUTH DAILY. What changed: See the new instructions.   gabapentin 300 MG capsule Commonly known as: NEURONTIN TAKE 1 CAPSULE(300 MG) BY MOUTH THREE TIMES DAILY What changed: See the new instructions.   insulin glargine 100 UNIT/ML injection Commonly known as: LANTUS Inject 40 Units into the skin every morning.   metFORMIN 500 MG tablet Commonly known as: GLUCOPHAGE Take 500 mg by mouth 2 (two) times daily with a meal.   metoprolol succinate 100 MG 24 hr tablet Commonly known as: TOPROL-XL Take 1 tablet (100 mg total) by mouth daily. Take with or immediately following a meal. What changed:  when to take  this additional instructions   multivitamin tablet Take 1 tablet by mouth daily with breakfast.   nitroGLYCERIN 0.4 MG SL tablet Commonly known as: NITROSTAT Place 0.4 mg under the tongue every 5 (five) minutes as needed for chest pain (x 3 doses).   pantoprazole 40 MG tablet Commonly known as: PROTONIX TAKE 1 TABLET BY MOUTH DAILY AT NOON What changed: See the new instructions.   potassium chloride SA 20 MEQ tablet Commonly known as: KLOR-CON M TAKE 1 TABLET(20 MEQ) BY MOUTH DAILY What changed:  how much to take how to take this when to take this additional instructions   tamsulosin 0.4 MG Caps capsule Commonly known as: FLOMAX Take 0.4 mg by mouth every other day.   TYLENOL 500 MG tablet Generic drug: acetaminophen Take 500-1,000 mg by mouth every 6 (six) hours as needed for mild pain or headache.   VITAMIN D3 PO Take 1 capsule by mouth daily.        Follow-up Information     [name], MD Follow up.   Specialty: Family Medicine Why: Recommend follow-up chest imaging to ensure resolution of multifocal pneumonia Contact information: [place] [address] [phone]         Martinique, Peter M, MD Follow up.   Specialty: Cardiology Contact information: [address] [place] [address] [phone]                Allergies  Allergen Reactions   Septra [Sulfamethoxazole-Trimethoprim] Itching   Cefadroxil Other (See Comments)    Unknown reaction    Ciprofloxacin Other (See Comments)    Unknown action     Erythromycin Other (See Comments)    Upsets the stomach     Consultations: None    Procedures/Studies: DG Swallowing Func-Speech Pathology  Result Date: 10/12/2021 Table formatting from the original result was not included. Objective Swallowing Evaluation: Type of Study: MBS-Modified Barium Swallow Study  Patient Details Name: [name] MRN: [id number] Date of Birth: [birth year] Today's  Date: 10/12/2021 Time: SLP Start Time (ACUTE ONLY): 0825 -SLP Stop Time (ACUTE ONLY): 0845 SLP Time Calculation (min) (ACUTE ONLY): 20 min Past Medical History: Past Medical History: Diagnosis Date  Abdominal pain   Acute renal failure ([place])    resolved  Arthritis   "hands & legs" (11/'10/2014)  Ataxia   Atrial fibrillation (HCC)   Bladder outlet obstruction   Bladder outlet obstruction   BPH (benign prostatic hyperplasia)   CAD (coronary artery disease)   a. CABG IN 1989. b. 01/08/2015 CTO of ost LAD, LIMA to LAD not visualized but assumed patent  given myoview finding, occluded SVG to diagonal, 99% mid RCA tx w/ SYNERGY DES 3X28 mm  Constipation   Diabetes mellitus type 2, insulin dependent ([place])   Epistaxis, recurrent Feb 2017  GERD (gastroesophageal reflux disease)   HTN (hypertension)   Hx of bacterial pneumonia   Hyperlipemia   Kidney stones   Mild aortic stenosis   Mild cognitive impairment 03/05/2021  MMSE 26/30 and 6CIT 9 03/2021 AWV  Pneumonia 04/2019  Rib fractures   left rib fractures being treated with pain medications  Stented coronary artery Nov 2016  RCA DES  Urinary tract infection    Enterococcus Past Surgical History: Past Surgical History: Procedure Laterality Date  [place] N/A 01/08/2015  Procedure: Left Heart Cath and Cors/Grafts Angiography;  Surgeon: Peter M Martinique, MD;  Location: [facility];  Service: Cardiovascular;  Laterality: N/A;  CARDIAC CATHETERIZATION  01/08/2015  Procedure: Coronary Stent Intervention;  Surgeon: Peter M Martinique, MD;  Location: [facility];  Service: Cardiovascular;;  CARPAL TUNNEL RELEASE Right 08/2010  CATARACT EXTRACTION W/ INTRAOCULAR LENS  IMPLANT, BILATERAL Bilateral   CORONARY ANGIOPLASTY  01/08/15  RCA DES  CORONARY ARTERY BYPASS GRAFT  1989  "CABG X 2"  IR THORACENTESIS ASP PLEURAL SPACE W/IMG GUIDE  05/14/2019  JOINT REPLACEMENT    TONSILLECTOMY    TOTAL HIP ARTHROPLASTY Right 2000 HPI: Patient is a 88 y.o.  male with PMH: GERD (on PPI), abdominal pain, resolved acute renal failure, osteoarthritis, ataxia, chronic atrial fibrillation on apixaban, bladder outlet obstruction, BPH, CAD, history of RCA DES, constipation, insulin requiring type II DM, epistaxis, hypertension, hyperlipidemia, nephrolithiasis, mild aortic stenosis, mild cognitive impairment, rib fractures, history of pneumonia. He presented to the hospital on 10/10/21 with c/o leg swelling and SOB. CXR showed Regressed but not resolved right lung Multilobar Pneumonia since 10/04/2021. SLP evaluation of swallow ordered secondary to family reporting patient has episodes of coughing and clearing his throat with his meals.  Subjective: pleasant, happy that his legs were not swollen this morning  Recommendations for follow up therapy are one component of a multi-disciplinary discharge planning process, led by the attending physician.  Recommendations may be updated based on patient status, additional functional criteria and insurance authorization. Assessment / Plan / Recommendation   10/12/2021   8:50 AM Clinical Impressions Clinical Impression Patient's oral, pharyngeal and cervical esophageal phases of swallow all WFL as per this MBS. No penetration or aspiration observed with any of the tested barium consistencies (thin, nectar thick, 13 mm barium tablet, regular solid). He exhibited trace to mild vallecular sinus residuals post initial swallows with liquids and solids which cleared with second dry swallow and is considered normal for his age. Barium tablet transited through pharynx and esophagus without delay and esophageal sweep did not reveal any barium stasis or retrograde movement of barium. SLP provided education during and after this MBS and recommendation is that he follow general GERD precautions and swallow an extra time especially with solids in order to clear pharyngeal residuals. No further intevention recommended at this time. SLP Visit Diagnosis  Dysphagia, pharyngeal phase (R13.13) Impact on safety and function No limitations     10/12/2021   8:50 AM Treatment Recommendations Treatment Recommendations No treatment recommended at this time     10/12/2021   8:55 AM Prognosis Prognosis for Safe Diet Advancement Good   10/12/2021   8:50 AM Diet Recommendations SLP Diet Recommendations Regular solids;Thin liquid Liquid Administration via Cup;Straw Medication Administration Whole  meds with liquid Compensations Slow rate Postural Changes Seated upright at 90 degrees;Remain semi-upright after after feeds/meals (Comment)     10/12/2021   8:50 AM Other Recommendations Oral Care Recommendations Oral care BID Follow Up Recommendations No SLP follow up Assistance recommended at discharge None Functional Status Assessment Patient has had a recent decline in their functional status and demonstrates the ability to make significant improvements in function in a reasonable and predictable amount of time.   10/11/2021   4:46 PM Frequency and Duration  Speech Therapy Frequency (ACUTE ONLY) min 1 x/week Treatment Duration 1 week     10/12/2021   8:50 AM Oral Phase Oral Phase [facility]    10/12/2021   8:50 AM Pharyngeal Phase Pharyngeal Phase [facility]    10/12/2021   8:50 AM Cervical Esophageal Phase  Cervical Esophageal Phase [facility] Sonia Baller, MA, CCC-SLP Speech Therapy                     ECHOCARDIOGRAM COMPLETE  Result Date: 10/10/2021    ECHOCARDIOGRAM REPORT   Patient Name:   [name] Date of Exam: 10/10/2021 Medical Rec #:  [id number]       Height:       66.0 in Accession #:    [id number]      Weight:       210.0 lb Date of Birth:  05-Sep-1928       BSA:          2.042 m Patient Age:    70 years        BP:           123/69 mmHg Patient Gender: M               HR:           120 bpm. Exam Location:  Inpatient Procedure: 2D Echo, Cardiac Doppler and Color Doppler Indications:     Congestive Heart Failure I50.9  History:         Patient has prior history of Echocardiogram  examinations, most                  recent 06/10/2020. CAD, Arrythmias:Atrial Fibrillation; Risk                  Factors:Diabetes, Dyslipidemia, Hypertension and GERD.  Sonographer:     [name] RDCS Referring Phys:  [id number] [place] Diagnosing Phys: Eleonore Chiquito MD IMPRESSIONS  1. Left ventricular ejection fraction, by estimation, is 55 to 60%. The left ventricle has normal function. The left ventricle has no regional wall motion abnormalities. There is moderate concentric left ventricular hypertrophy. Left ventricular diastolic function could not be evaluated. There is the interventricular septum is flattened in systole and diastole, consistent with right ventricular pressure and volume overload.  2. Right ventricular systolic function is mildly reduced. The right ventricular size is mildly enlarged. There is normal pulmonary artery systolic pressure. The estimated right ventricular systolic pressure is 95.0 mmHg.  3. Left atrial size was mildly dilated.  4. The mitral valve is degenerative. Trivial mitral valve regurgitation. No evidence of mitral stenosis.  5. The aortic valve is tricuspid. There is moderate calcification of the aortic valve. There is moderate thickening of the aortic valve. Aortic valve regurgitation is trivial. Mild to moderate aortic valve stenosis. Aortic valve area, by VTI measures 1.05 cm. Aortic valve mean gradient measures 19.3 mmHg. Aortic valve Vmax measures 2.92 m/s.  6. Asc aorta up to 40  mm on this study. 48 mm on last study. CT scan recorded 43 mm. Would defer to cross sectional imaging (CT). There is mild dilatation of the ascending aorta, measuring 40 mm.  7. The inferior vena cava is normal in size with greater than 50% respiratory variability, suggesting right atrial pressure of 3 mmHg. Comparison(s): No significant change from prior study. FINDINGS  Left Ventricle: Left ventricular ejection fraction, by estimation, is 55 to 60%. The left ventricle has normal  function. The left ventricle has no regional wall motion abnormalities. The left ventricular internal cavity size was normal in size. There is  moderate concentric left ventricular hypertrophy. The interventricular septum is flattened in systole and diastole, consistent with right ventricular pressure and volume overload. Left ventricular diastolic function could not be evaluated due to atrial fibrillation. Left ventricular diastolic function could not be evaluated. Right Ventricle: The right ventricular size is mildly enlarged. No increase in right ventricular wall thickness. Right ventricular systolic function is mildly reduced. There is normal pulmonary artery systolic pressure. The tricuspid regurgitant velocity  is 1.78 m/s, and with an assumed right atrial pressure of 3 mmHg, the estimated right ventricular systolic pressure is 76.2 mmHg. Left Atrium: Left atrial size was mildly dilated. Right Atrium: Right atrial size was normal in size. Pericardium: There is no evidence of pericardial effusion. Mitral Valve: The mitral valve is degenerative in appearance. Mild to moderate mitral annular calcification. Trivial mitral valve regurgitation. No evidence of mitral valve stenosis. Tricuspid Valve: The tricuspid valve is grossly normal. Tricuspid valve regurgitation is trivial. No evidence of tricuspid stenosis. Aortic Valve: The aortic valve is tricuspid. There is moderate calcification of the aortic valve. There is moderate thickening of the aortic valve. Aortic valve regurgitation is trivial. Aortic regurgitation PHT measures 536 msec. Mild to moderate aortic  stenosis is present. Aortic valve mean gradient measures 19.3 mmHg. Aortic valve peak gradient measures 34.2 mmHg. Aortic valve area, by VTI measures 1.05 cm. Pulmonic Valve: The pulmonic valve was grossly normal. Pulmonic valve regurgitation is trivial. No evidence of pulmonic stenosis. Aorta: Asc aorta up to 40 mm on this study. 48 mm on last study. CT  scan recorded 43 mm. Would defer to cross sectional imaging (CT). The aortic root is normal in size and structure. There is mild dilatation of the ascending aorta, measuring 40 mm. Venous: The inferior vena cava is normal in size with greater than 50% respiratory variability, suggesting right atrial pressure of 3 mmHg. IAS/Shunts: The atrial septum is grossly normal.  LEFT VENTRICLE PLAX 2D LVIDd:         4.50 cm LVIDs:         3.40 cm LV PW:         1.40 cm LV IVS:        1.60 cm LVOT diam:     2.10 cm LV SV:         64 LV SV Index:   32 LVOT Area:     3.46 cm  LV Volumes (MOD) LV vol d, MOD A2C: 87.7 ml LV vol d, MOD A4C: 80.9 ml LV vol s, MOD A2C: 43.6 ml LV vol s, MOD A4C: 35.1 ml LV SV MOD A2C:     44.1 ml LV SV MOD A4C:     80.9 ml LV SV MOD BP:      46.3 ml RIGHT VENTRICLE RV S prime:     10.50 cm/s TAPSE (M-mode): 2.0 cm LEFT ATRIUM  Index        RIGHT ATRIUM           Index LA diam:        4.80 cm 2.35 cm/m   RA Area:     25.70 cm LA Vol (A2C):   55.9 ml 27.38 ml/m  RA Volume:   81.00 ml  39.67 ml/m LA Vol (A4C):   52.7 ml 25.81 ml/m LA Biplane Vol: 59.5 ml 29.14 ml/m  AORTIC VALVE AV Area (Vmax):    1.25 cm AV Area (Vmean):   1.18 cm AV Area (VTI):     1.05 cm AV Vmax:           292.33 cm/s AV Vmean:          204.667 cm/s AV VTI:            0.616 m AV Peak Grad:      34.2 mmHg AV Mean Grad:      19.3 mmHg LVOT Vmax:         105.23 cm/s LVOT Vmean:        69.433 cm/s LVOT VTI:          0.186 m LVOT/AV VTI ratio: 0.30 AI PHT:            536 msec AR Vena Contracta: 0.30 cm  AORTA Ao Root diam: 3.50 cm Ao Asc diam:  4.00 cm TRICUSPID VALVE TR Peak grad:   12.7 mmHg TR Vmax:        178.00 cm/s  SHUNTS Systemic VTI:  0.19 m Systemic Diam: 2.10 cm Eleonore Chiquito MD Electronically signed by Eleonore Chiquito MD Signature Date/Time: 10/10/2021/3:17:09 PM    Final (Updated)    DG Chest Port 1 View  Result Date: 10/10/2021 CLINICAL DATA:  88 year old male with sepsis. Multilobar right lung  pneumonia. EXAM: PORTABLE CHEST 1 VIEW COMPARISON:  CTA chest 10/04/2021 and earlier. FINDINGS: Portable AP semi upright view at 0842 hours. Improved lung volumes. Regressed but not resolved multilobar right lung opacity since 10/04/2021. Most confluent residual is in the upper lobe. Stable cardiomegaly and mediastinal contours. Prior CABG. Left lung appears negative. No pneumothorax or pleural effusion. No acute osseous abnormality identified. IMPRESSION: 1. Regressed but not resolved right lung Multilobar Pneumonia since 10/04/2021. 2. No new cardiopulmonary abnormality. Electronically Signed   By: Genevie Ann M.D.   On: 10/10/2021 09:03   CT Chest W Contrast  Result Date: 10/04/2021 CLINICAL DATA:  Pneumonia.  Evaluate for complication. EXAM: CT CHEST WITH CONTRAST TECHNIQUE: Multidetector CT imaging of the chest was performed during intravenous contrast administration. RADIATION DOSE REDUCTION: This exam was performed according to the departmental dose-optimization program which includes automated exposure control, adjustment of the mA and/or kV according to patient size and/or use of iterative reconstruction technique. CONTRAST:  24m OMNIPAQUE IOHEXOL 300 MG/ML  SOLN COMPARISON:  02/04/2020 FINDINGS: Cardiovascular: Normal heart size. Previous median sternotomy and CABG procedure. Extensive aortic atherosclerosis. Ascending thoracic aorta measures 4.3 cm in diameter, unchanged from previous exam no pericardial effusion. Mediastinum/Nodes: Thyroid gland, trachea and esophagus are unremarkable. No axillary, mediastinal, or hilar adenopathy. Lungs/Pleura: No pleural effusion. Multifocal airspace disease is identified within the right upper lobe and right lower lobe compatible with multifocal pneumonia. Scarring is identified within the posterior left lower lobe. Upper Abdomen: No acute abnormality. Small hiatal hernia. Gallstones. Nonobstructing stone within upper pole of right kidney measures 3 mm.  Musculoskeletal: No chest wall abnormality. No acute or significant osseous findings. Multiple healed left lateral  rib fracture deformities identified. IMPRESSION: 1. Multifocal airspace disease is identified within the right upper lobe and right lower lobe compatible with multifocal pneumonia. Followup PA and lateral chest X-ray is recommended in 3-4 weeks following trial of antibiotic therapy to ensure resolution and exclude underlying malignancy. 2. Gallstones. 3. Nonobstructing right renal calculus. 4. Aortic Atherosclerosis (ICD10-I70.0). 5. Stable ascending thoracic aortic aneurysm measuring 4.3 cm. Recommend annual imaging followup by CTA or MRA. This recommendation follows 2010 ACCF/AHA/AATS/ACR/ASA/SCA/SCAI/SIR/STS/SVM Guidelines for the Diagnosis and Management of Patients with Thoracic Aortic Disease. Circulation. 2008; 121: [id number]. Aortic aneurysm NOS (ICD10-I71.9) Electronically Signed   By: Kerby Moors M.D.   On: 10/04/2021 07:40   CT HEAD WO CONTRAST (5MM)  Result Date: 10/04/2021 CLINICAL DATA:  Mental status change with unknown cause EXAM: CT HEAD WITHOUT CONTRAST TECHNIQUE: Contiguous axial images were obtained from the base of the skull through the vertex without intravenous contrast. RADIATION DOSE REDUCTION: This exam was performed according to the departmental dose-optimization program which includes automated exposure control, adjustment of the mA and/or kV according to patient size and/or use of iterative reconstruction technique. COMPARISON:  Brain MRI 06/06/2016 FINDINGS: Brain: No evidence of acute infarction, hemorrhage, hydrocephalus, extra-axial collection or mass lesion/mass effect. Generalized brain atrophy. Vascular: Scattered atheromatous calcification Skull: Normal. Negative for fracture or focal lesion. Sinuses/Orbits: No acute finding. IMPRESSION: No acute finding. Electronically Signed   By: Jorje Guild M.D.   On: 10/04/2021 07:38   CT Soft Tissue Neck W  Contrast  Result Date: 10/04/2021 CLINICAL DATA:  Neck mass, nonpulsatile.  Sepsis. EXAM: CT NECK WITH CONTRAST TECHNIQUE: Multidetector CT imaging of the neck was performed using the standard protocol following the bolus administration of intravenous contrast. RADIATION DOSE REDUCTION: This exam was performed according to the departmental dose-optimization program which includes automated exposure control, adjustment of the mA and/or kV according to patient size and/or use of iterative reconstruction technique. CONTRAST:  68m OMNIPAQUE IOHEXOL 300 MG/ML  SOLN COMPARISON:  None similar FINDINGS: Pharynx and larynx: No evidence of mass or inflammation. Salivary glands: No inflammation, mass, or stone. Thyroid: Unremarkable Lymph nodes: None enlarged or heterogeneous Vascular: Extensive atheromatous calcification. Major vessels are enhancing. Limited intracranial: No acute finding Visualized orbits: Bilateral cataract resection Mastoids and visualized paranasal sinuses: Clear Skeleton: Generalized cervical spine degeneration. No acute or aggressive finding. Upper chest: Extensive airspace disease in the right lung. IMPRESSION: 1. No acute finding in the neck. 2. Partial coverage of extensive right upper lobe pneumonia. Electronically Signed   By: JJorje GuildM.D.   On: 10/04/2021 07:20   DG Chest Portable 1 View  Result Date: 10/04/2021 CLINICAL DATA:  Cough.  Altered mental status. EXAM: PORTABLE CHEST 1 VIEW COMPARISON:  07/03/2020 FINDINGS: Previous median sternotomy and CABG procedure. Aortic atherosclerosis. Extent to a shin of the cardiac silhouette may reflect cardiomegaly. Small right pleural effusion. Large area of airspace consolidation is identified within the right upper and right lower lung. Left lung appears clear. Visualized osseous structures are unremarkable. IMPRESSION: Large area of airspace consolidation in the right upper and right lower lung compatible with pneumonia. Followup PA and  lateral chest X-ray is recommended in 3-4 weeks following trial of antibiotic therapy to ensure resolution and exclude underlying malignancy. Small right pleural effusion. Electronically Signed   By: TKerby MoorsM.D.   On: 10/04/2021 05:35      Discharge Exam: Vitals:   10/12/21 0409 10/12/21 0942  BP: (!) 118/52 112/63  Pulse: 91 (!) 118  Resp:  16   Temp: 98.2 F (36.8 C)   SpO2: 94%     General: Pt is alert, awake, not in acute distress Cardiovascular: RRR, S1/S2 +, no edema Respiratory: CTA bilaterally, no wheezing, no rhonchi, no respiratory distress, no conversational dyspnea, on room air  Abdominal: Soft, NT, ND, bowel sounds + Extremities: no edema, no cyanosis Psych: Normal mood and affect, stable judgement and insight     The results of significant diagnostics from this hospitalization (including imaging, microbiology, ancillary and laboratory) are listed below for reference.     Microbiology: Recent Results (from the past 240 hour(s))  Culture, blood (Routine x 2)     Status: None   Collection Time: 10/04/21  4:22 AM   Specimen: BLOOD RIGHT HAND  Result Value Ref Range Status   Specimen Description BLOOD RIGHT HAND  Final   Special Requests   Final    BOTTLES DRAWN AEROBIC AND ANAEROBIC Blood Culture adequate volume   Culture   Final    NO GROWTH 5 DAYS Performed at [facility], [address], [place],  [address]    Report Status 10/09/2021 FINAL  Final  Resp Panel by RT-PCR (Flu A&B, Covid) Anterior Nasal Swab     Status: None   Collection Time: 10/04/21  4:25 AM   Specimen: Anterior Nasal Swab  Result Value Ref Range Status   SARS Coronavirus 2 by RT PCR NEGATIVE NEGATIVE Final    Comment: (NOTE) SARS-CoV-2 target nucleic acids are NOT DETECTED.  The SARS-CoV-2 RNA is generally detectable in upper respiratory specimens during the acute phase of infection. The lowest concentration of SARS-CoV-2 viral copies this assay can detect is 138  copies/mL. A negative result does not preclude SARS-Cov-2 infection and should not be used as the sole basis for treatment or other patient management decisions. A negative result may occur with  improper specimen collection/handling, submission of specimen other than nasopharyngeal swab, presence of viral mutation(s) within the areas targeted by this assay, and inadequate number of viral copies(<138 copies/mL). A negative result must be combined with clinical observations, patient history, and epidemiological information. The expected result is Negative.  Fact Sheet for Patients:  EntrepreneurPulse.com.au  Fact Sheet for Healthcare Providers:  IncredibleEmployment.be  This test is no t yet approved or cleared by the [place] FDA and  has been authorized for detection and/or diagnosis of SARS-CoV-2 by FDA under an Emergency Use Authorization (EUA). This EUA will remain  in effect (meaning this test can be used) for the duration of the COVID-19 declaration under Section 564(b)(1) of the Act, 21 U.S.C.section 360bbb-3(b)(1), unless the authorization is terminated  or revoked sooner.       Influenza A by PCR NEGATIVE NEGATIVE Final   Influenza B by PCR NEGATIVE NEGATIVE Final    Comment: (NOTE) The Xpert Xpress SARS-CoV-2/FLU/RSV plus assay is intended as an aid in the diagnosis of influenza from Nasopharyngeal swab specimens and should not be used as a sole basis for treatment. Nasal washings and aspirates are unacceptable for Xpert Xpress SARS-CoV-2/FLU/RSV testing.  Fact Sheet for Patients: EntrepreneurPulse.com.au  Fact Sheet for Healthcare Providers: IncredibleEmployment.be  This test is not yet approved or cleared by the [place] FDA and has been authorized for detection and/or diagnosis of SARS-CoV-2 by FDA under an Emergency Use Authorization (EUA). This EUA will remain in effect (meaning  this test can be used) for the duration of the COVID-19 declaration under Section 564(b)(1) of the Act, 21 U.S.C. section 360bbb-3(b)(1),  unless the authorization is terminated or revoked.  Performed at [facility], [place] [address], [place], [place] [address]   Culture, blood (Routine x 2)     Status: None   Collection Time: 10/04/21  4:35 AM   Specimen: BLOOD  Result Value Ref Range Status   Specimen Description BLOOD SITE NOT SPECIFIED  Final   Special Requests   Final    BOTTLES DRAWN AEROBIC AND ANAEROBIC Blood Culture adequate volume   Culture   Final    NO GROWTH 5 DAYS Performed at [facility], [address], [place], [place] [address]    Report Status 10/09/2021 FINAL  Final  Culture, blood (routine x 2)     Status: None (Preliminary result)   Collection Time: 10/10/21  8:10 AM   Specimen: BLOOD  Result Value Ref Range Status   Specimen Description   Final    BLOOD BLOOD RIGHT FOREARM Performed at [facility] [address], [place], [place] [address]    Special Requests   Final    BOTTLES DRAWN AEROBIC AND ANAEROBIC Blood Culture results may not be optimal due to an inadequate volume of blood received in culture bottles Performed at [facility] [address], [place], [place] [address]    Culture   Final    NO GROWTH 2 DAYS Performed at [facility] [address] [place], [place] [address]    Report Status PENDING  Incomplete  Culture, blood (routine x 2)     Status: None (Preliminary result)   Collection Time: 10/10/21  8:15 AM   Specimen: BLOOD  Result Value Ref Range Status   Specimen Description   Final    BLOOD BLOOD RIGHT HAND Performed at [facility] [address], [place], [place] [address]    Special Requests   Final    BOTTLES DRAWN AEROBIC AND ANAEROBIC Blood Culture results may not be optimal due to an inadequate volume of blood received in culture  bottles Performed at [facility] [address], [place], [place] [address]    Culture   Final    NO GROWTH 2 DAYS Performed at [facility] [address], [place], [place] [address]    Report Status PENDING  Incomplete  Resp Panel by RT-PCR (Flu A&B, Covid) Anterior Nasal Swab     Status: None   Collection Time: 10/10/21  8:32 AM   Specimen: Anterior Nasal Swab  Result Value Ref Range Status   SARS Coronavirus 2 by RT PCR NEGATIVE NEGATIVE Final    Comment: (NOTE) SARS-CoV-2 target nucleic acids are NOT DETECTED.  The SARS-CoV-2 RNA is generally detectable in upper respiratory specimens during the acute phase of infection. The lowest concentration of SARS-CoV-2 viral copies this assay can detect is 138 copies/mL. A negative result does not preclude SARS-Cov-2 infection and should not be used as the sole basis for treatment or other patient management decisions. A negative result may occur with  improper specimen collection/handling, submission of specimen other than nasopharyngeal swab, presence of viral mutation(s) within the areas targeted by this assay, and inadequate number of viral copies(<138 copies/mL). A negative result must be combined with clinical observations, patient history, and epidemiological information. The expected result is Negative.  Fact Sheet for Patients:  EntrepreneurPulse.com.au  Fact Sheet for Healthcare Providers:  IncredibleEmployment.be  This test is no t yet approved or cleared by the [place] and  has been authorized for detection and/or diagnosis of SARS-CoV-2 by FDA under an Emergency Use Authorization (EUA). This EUA will remain  in effect (meaning this test can be used) for the duration of the COVID-19 declaration under Section 564(b)(1) of the Act, 21 U.S.C.section 360bbb-3(b)(1), unless the authorization is terminated  or revoked sooner.       Influenza A by PCR  NEGATIVE NEGATIVE Final   Influenza B by PCR NEGATIVE NEGATIVE Final    Comment: (NOTE) The Xpert Xpress SARS-CoV-2/FLU/RSV plus assay is intended as an aid in the diagnosis of influenza from Nasopharyngeal swab specimens and should not be used as a sole basis for treatment. Nasal washings and aspirates are unacceptable for Xpert Xpress SARS-CoV-2/FLU/RSV testing.  Fact Sheet for Patients: EntrepreneurPulse.com.au  Fact Sheet for Healthcare Providers: IncredibleEmployment.be  This test is not yet approved or cleared by the [place] FDA and has been authorized for detection and/or diagnosis of SARS-CoV-2 by FDA under an Emergency Use Authorization (EUA). This EUA will remain in effect (meaning this test can be used) for the duration of the COVID-19 declaration under Section 564(b)(1) of the Act, 21 U.S.C. section 360bbb-3(b)(1), unless the authorization is terminated or revoked.  Performed at [facility], [place] [address], [place],  [address]      Labs: BNP (last 3 results) Recent Labs    10/10/21 0859  BNP 604.5*   Basic Metabolic Panel: Recent Labs  Lab 10/06/21 0425 10/10/21 0944 10/10/21 1124 10/11/21 0435 10/12/21 0349  NA 138 141  --  142 142  K 3.8 3.4*  --  4.0 4.1  CL 106 107  --  107 107  CO2 22 24  --  26 27  GLUCOSE 76 143*  --  144* 136*  BUN 15 16  --  17 19  CREATININE 1.08 1.13  --  1.12 1.07  CALCIUM 8.9 8.7*  --  8.9 8.5*  MG 1.9  --  1.8  --   --   PHOS  --   --  4.0  --   --    Liver Function Tests: Recent Labs  Lab 10/06/21 0425 10/10/21 0944  AST 16 32  ALT 12 33  ALKPHOS 57 68  BILITOT 1.1 1.0  PROT 5.8* 6.2*  ALBUMIN 2.9* 2.8*   No results for input(s): "LIPASE", "AMYLASE" in the last 168 hours. No results for input(s): "AMMONIA" in the last 168 hours. CBC: Recent Labs  Lab 10/06/21 0425 10/07/21 0314 10/10/21 0858 10/11/21 0435 10/12/21 0349  WBC 19.2*  15.8* 13.4* 13.8* 10.3  NEUTROABS  --   --  10.1*  --   --   HGB 9.7* 9.2* 10.3* 9.8* 9.0*  HCT 31.4* 29.7* 33.4* 31.8* 29.5*  MCV 81.3 81.1 82.1 81.5 81.7  PLT 223 225 355 390 381   Cardiac Enzymes: No results for input(s): "CKTOTAL", "CKMB", "CKMBINDEX", "TROPONINI" in the last 168 hours. BNP: Invalid input(s): "POCBNP" CBG: Recent Labs  Lab 10/10/21 1650 10/10/21 2019 10/11/21 1634 10/11/21 2136 10/12/21 0730  GLUCAP 151* 189* 148* 187* 117*   D-Dimer No results for input(s): "DDIMER" in the last 72 hours. Hgb A1c No results for input(s): "HGBA1C" in the last 72 hours. Lipid Profile No results for input(s): "CHOL", "HDL", "LDLCALC", "TRIG", "CHOLHDL", "LDLDIRECT" in the last 72 hours. Thyroid function studies No results for input(s): "TSH", "T4TOTAL", "T3FREE", "THYROIDAB" in the last 72 hours.  Invalid input(s): "FREET3" Anemia work up No results for input(s): "VITAMINB12", "FOLATE", "FERRITIN", "  TIBC", "IRON", "RETICCTPCT" in the last 72 hours. Urinalysis    Component Value Date/Time   COLORURINE STRAW (A) 10/10/2021 1331   APPEARANCEUR CLEAR 10/10/2021 1331   LABSPEC 1.015 10/10/2021 1331   PHURINE 6.0 10/10/2021 1331   GLUCOSEU >=500 (A) 10/10/2021 1331   GLUCOSEU >=1000 (A) 06/25/2020 1152   HGBUR NEGATIVE 10/10/2021 1331   BILIRUBINUR NEGATIVE 10/10/2021 1331   BILIRUBINUR negative 07/03/2020 0949   KETONESUR 5 (A) 10/10/2021 1331   PROTEINUR NEGATIVE 10/10/2021 1331   UROBILINOGEN 0.2 07/03/2020 0949   UROBILINOGEN 0.2 06/25/2020 1152   NITRITE NEGATIVE 10/10/2021 1331   LEUKOCYTESUR NEGATIVE 10/10/2021 1331   Sepsis Labs Recent Labs  Lab 10/07/21 0314 10/10/21 0858 10/11/21 0435 10/12/21 0349  WBC 15.8* 13.4* 13.8* 10.3   Microbiology Recent Results (from the past 240 hour(s))  Culture, blood (Routine x 2)     Status: None   Collection Time: 10/04/21  4:22 AM   Specimen: BLOOD RIGHT HAND  Result Value Ref Range Status   Specimen  Description BLOOD RIGHT HAND  Final   Special Requests   Final    BOTTLES DRAWN AEROBIC AND ANAEROBIC Blood Culture adequate volume   Culture   Final    NO GROWTH 5 DAYS Performed at [facility], [place] [address], [place], [place] [address]    Report Status 10/09/2021 FINAL  Final  Resp Panel by RT-PCR (Flu A&B, Covid) Anterior Nasal Swab     Status: None   Collection Time: 10/04/21  4:25 AM   Specimen: Anterior Nasal Swab  Result Value Ref Range Status   SARS Coronavirus 2 by RT PCR NEGATIVE NEGATIVE Final    Comment: (NOTE) SARS-CoV-2 target nucleic acids are NOT DETECTED.  The SARS-CoV-2 RNA is generally detectable in upper respiratory specimens during the acute phase of infection. The lowest concentration of SARS-CoV-2 viral copies this assay can detect is 138 copies/mL. A negative result does not preclude SARS-Cov-2 infection and should not be used as the sole basis for treatment or other patient management decisions. A negative result may occur with  improper specimen collection/handling, submission of specimen other than nasopharyngeal swab, presence of viral mutation(s) within the areas targeted by this assay, and inadequate number of viral copies(<138 copies/mL). A negative result must be combined with clinical observations, patient history, and epidemiological information. The expected result is Negative.  Fact Sheet for Patients:  EntrepreneurPulse.com.au  Fact Sheet for Healthcare Providers:  IncredibleEmployment.be  This test is no t yet approved or cleared by the [place] FDA and  has been authorized for detection and/or diagnosis of SARS-CoV-2 by FDA under an Emergency Use Authorization (EUA). This EUA will remain  in effect (meaning this test can be used) for the duration of the COVID-19 declaration under Section 564(b)(1) of the Act, 21 U.S.C.section 360bbb-3(b)(1), unless the authorization is terminated  or  revoked sooner.       Influenza A by PCR NEGATIVE NEGATIVE Final   Influenza B by PCR NEGATIVE NEGATIVE Final    Comment: (NOTE) The Xpert Xpress SARS-CoV-2/FLU/RSV plus assay is intended as an aid in the diagnosis of influenza from Nasopharyngeal swab specimens and should not be used as a sole basis for treatment. Nasal washings and aspirates are unacceptable for Xpert Xpress SARS-CoV-2/FLU/RSV testing.  Fact Sheet for Patients: EntrepreneurPulse.com.au  Fact Sheet for Healthcare Providers: IncredibleEmployment.be  This test is not yet approved or cleared by the [place] FDA and has been authorized for detection and/or diagnosis of SARS-CoV-2  by FDA under an Emergency Use Authorization (EUA). This EUA will remain in effect (meaning this test can be used) for the duration of the COVID-19 declaration under Section 564(b)(1) of the Act, 21 U.S.C. section 360bbb-3(b)(1), unless the authorization is terminated or revoked.  Performed at [facility], [place] [address], [place], [place] [address]   Culture, blood (Routine x 2)     Status: None   Collection Time: 10/04/21  4:35 AM   Specimen: BLOOD  Result Value Ref Range Status   Specimen Description BLOOD SITE NOT SPECIFIED  Final   Special Requests   Final    BOTTLES DRAWN AEROBIC AND ANAEROBIC Blood Culture adequate volume   Culture   Final    NO GROWTH 5 DAYS Performed at [facility], [address] [place], [place] [address]    Report Status 10/09/2021 FINAL  Final  Culture, blood (routine x 2)     Status: None (Preliminary result)   Collection Time: 10/10/21  8:10 AM   Specimen: BLOOD  Result Value Ref Range Status   Specimen Description   Final    BLOOD BLOOD RIGHT FOREARM Performed at [facility] [address], [place], [place] [address]    Special Requests   Final    BOTTLES DRAWN AEROBIC AND ANAEROBIC Blood Culture results may not be  optimal due to an inadequate volume of blood received in culture bottles Performed at [facility] [address], [place], [place] [address]    Culture   Final    NO GROWTH 2 DAYS Performed at [facility] [address] [place], [place] [address]    Report Status PENDING  Incomplete  Culture, blood (routine x 2)     Status: None (Preliminary result)   Collection Time: 10/10/21  8:15 AM   Specimen: BLOOD  Result Value Ref Range Status   Specimen Description   Final    BLOOD BLOOD RIGHT HAND Performed at [facility] [address], [place], [place] [address]    Special Requests   Final    BOTTLES DRAWN AEROBIC AND ANAEROBIC Blood Culture results may not be optimal due to an inadequate volume of blood received in culture bottles Performed at [facility] [address], [place], [place] [address]    Culture   Final    NO GROWTH 2 DAYS Performed at [facility] [address], [place], [place] [address]    Report Status PENDING  Incomplete  Resp Panel by RT-PCR (Flu A&B, Covid) Anterior Nasal Swab     Status: None   Collection Time: 10/10/21  8:32 AM   Specimen: Anterior Nasal Swab  Result Value Ref Range Status   SARS Coronavirus 2 by RT PCR NEGATIVE NEGATIVE Final    Comment: (NOTE) SARS-CoV-2 target nucleic acids are NOT DETECTED.  The SARS-CoV-2 RNA is generally detectable in upper respiratory specimens during the acute phase of infection. The lowest concentration of SARS-CoV-2 viral copies this assay can detect is 138 copies/mL. A negative result does not preclude SARS-Cov-2 infection and should not be used as the sole basis for treatment or other patient management decisions. A negative result may occur with  improper specimen collection/handling, submission of specimen other than nasopharyngeal swab, presence of viral mutation(s) within the areas targeted by this assay, and inadequate number of  viral copies(<138 copies/mL). A negative result must be combined with clinical observations, patient history, and epidemiological  information. The expected result is Negative.  Fact Sheet for Patients:  EntrepreneurPulse.com.au  Fact Sheet for Healthcare Providers:  IncredibleEmployment.be  This test is no t yet approved or cleared by the [place] FDA and  has been authorized for detection and/or diagnosis of SARS-CoV-2 by FDA under an Emergency Use Authorization (EUA). This EUA will remain  in effect (meaning this test can be used) for the duration of the COVID-19 declaration under Section 564(b)(1) of the Act, 21 U.S.C.section 360bbb-3(b)(1), unless the authorization is terminated  or revoked sooner.       Influenza A by PCR NEGATIVE NEGATIVE Final   Influenza B by PCR NEGATIVE NEGATIVE Final    Comment: (NOTE) The Xpert Xpress SARS-CoV-2/FLU/RSV plus assay is intended as an aid in the diagnosis of influenza from Nasopharyngeal swab specimens and should not be used as a sole basis for treatment. Nasal washings and aspirates are unacceptable for Xpert Xpress SARS-CoV-2/FLU/RSV testing.  Fact Sheet for Patients: EntrepreneurPulse.com.au  Fact Sheet for Healthcare Providers: IncredibleEmployment.be  This test is not yet approved or cleared by the [place] FDA and has been authorized for detection and/or diagnosis of SARS-CoV-2 by FDA under an Emergency Use Authorization (EUA). This EUA will remain in effect (meaning this test can be used) for the duration of the COVID-19 declaration under Section 564(b)(1) of the Act, 21 U.S.C. section 360bbb-3(b)(1), unless the authorization is terminated or revoked.  Performed at [facility], [place] [address] [place], [place] [address]      Patient was seen and examined on the day of discharge and was found to be in stable condition.  Time coordinating discharge: 25 minutes including assessment and coordination of care, as well as examination of the patient.   SIGNED:  Dessa Phi, DO [facility] 10/12/2021, 10:44 AM

## 2021-10-12 NOTE — Progress Notes (Signed)
Modified Barium Swallow Progress Note  Patient Details  Name: [name] MRN: [id number] Date of Birth: [birth year]  Today's Date: 10/12/2021  Modified Barium Swallow completed.  Full report located under Chart Review in the Imaging Section.  Brief recommendations include the following:  Clinical Impression  Patient's oral, pharyngeal and cervical esophageal phases of swallow all WFL as per this MBS. No penetration or aspiration observed with any of the tested barium consistencies (thin, nectar thick, 13 mm barium tablet, regular solid). He exhibited trace to mild vallecular sinus residuals post initial swallows with liquids and solids which cleared with second dry swallow and is considered normal for his age. Barium tablet transited through pharynx and esophagus without delay and esophageal sweep did not reveal any barium stasis or retrograde movement of barium. SLP provided education during and after this MBS and recommendation is that he follow general GERD precautions and swallow an extra time especially with solids in order to clear pharyngeal residuals. No further intevention recommended at this time.   Swallow Evaluation Recommendations       SLP Diet Recommendations: Regular solids;Thin liquid   Liquid Administration via: Cup;Straw   Medication Administration: Whole meds with liquid   Supervision: Patient able to self feed   Compensations: Slow rate   Postural Changes: Seated upright at 90 degrees;Remain semi-upright after after feeds/meals (Comment) (30-45 minutes)   Oral Care Recommendations: Oral care BID        Sonia Baller, MA, CCC-SLP Speech Therapy

## 2021-10-12 NOTE — Telephone Encounter (Signed)
   Telephone encounter was:  Successful.  10/12/2021 Name: [name] MRN: [id number] DOB: 21-Oct-1928  [name] is a 88 y.o. year old male who is a primary care patient of [name], MD . The community resource team was consulted for assistance with Transportation Needs   Care guide performed the following interventions: Patient provided with information about care guide support team and interviewed to confirm resource needs Follow up call placed to community resources to determine status of patients referral.patient in hospital will not need transportation for tomorrow instead set up appt for 10/19/2021 at PCP for follow up   Follow Up Plan:  Care guide will follow up with patient by phone over the next day  [facility] [address] , [place] [address] Email : [email or web address]

## 2021-10-13 ENCOUNTER — Other Ambulatory Visit: Payer: Self-pay

## 2021-10-13 ENCOUNTER — Ambulatory Visit: Payer: Medicare Other | Admitting: Family Medicine

## 2021-10-13 ENCOUNTER — Telehealth: Payer: Self-pay | Admitting: *Deleted

## 2021-10-13 NOTE — Patient Outreach (Signed)
  Care Coordination [facility] Note Transition Care Management Follow-up Telephone Call Date of discharge and from where: 10/13/21 [facility] How have you been since you were released from the hospital? Feeling pretty good and the swelling has gone down Any questions or concerns? No  Items Reviewed: Did the pt receive and understand the discharge instructions provided? Yes  Medications obtained and verified? Yes  Other? No  Any new allergies since your discharge? No  Dietary orders reviewed? No Do you have support at home? Yes   Home Care and Equipment/Supplies: Were home health services ordered? yes If so, what is the name of the agency? [facility]  Has the agency set up a time to come to the patient's home? yes Were any new equipment or medical supplies ordered?  No What is the name of the medical supply agency? N/a Were you able to get the supplies/equipment? not applicable Do you have any questions related to the use of the equipment or supplies? No  Functional Questionnaire: (I = Independent and D = Dependent) ADLs: I  Bathing/Dressing- I  Meal Prep- D  Eating- I  Maintaining continence- I  Transferring/Ambulation- I  Managing Meds- I  Follow up appointments reviewed:  PCP Hospital f/u appt confirmed? Yes  Scheduled to see Dr. Jonni Sanger on 10/14/21 @ [facility] f/u appt confirmed? No   Are transportation arrangements needed? No transportation previously set up  If their condition worsens, is the pt aware to call PCP or go to the Emergency Dept.? Yes Was the patient provided with contact information for the PCP's office or ED? Yes Was to pt encouraged to call back with questions or concerns? Yes  SDOH assessments and interventions completed:   No  Care Coordination Interventions Activated:  Yes   Care Coordination Interventions:  Referred for Care Coordination Services:  RN Care Coordinator    Encounter Outcome:  Pt. Visit Completed   Peter Garter RN,  BSN,CCM, CDE Care Management Coordinator [facility] [phone]

## 2021-10-13 NOTE — Telephone Encounter (Signed)
   Telephone encounter was:  Successful.  10/13/2021 Name: [name] MRN: [id number] DOB: 09-Oct-1928  [name] is a 88 y.o. year old male who is a primary care patient of [name], MD . The community resource team was consulted for assistance with Transportation Needs   Care guide performed the following interventions: Patient provided with information about care guide support team and interviewed to confirm resource needs Follow up call placed to community resources to determine status of patients referral. Booked cardiology appt for 10/22/2021 and provided concierge information for future appts  Follow Up Plan:  No further follow up planned at this time. The patient has been provided with needed resources.  [facility] [phone] [address] , [place] [address] Email : [email or web address] [email or web address]

## 2021-10-14 ENCOUNTER — Encounter: Payer: Self-pay | Admitting: Family Medicine

## 2021-10-14 ENCOUNTER — Ambulatory Visit (INDEPENDENT_AMBULATORY_CARE_PROVIDER_SITE_OTHER): Payer: Medicare Other | Admitting: Family Medicine

## 2021-10-14 VITALS — BP 136/80 | HR 69 | Temp 98.7°F | Ht 66.0 in | Wt 198.0 lb

## 2021-10-14 DIAGNOSIS — J189 Pneumonia, unspecified organism: Secondary | ICD-10-CM | POA: Diagnosis not present

## 2021-10-14 DIAGNOSIS — I5033 Acute on chronic diastolic (congestive) heart failure: Secondary | ICD-10-CM | POA: Diagnosis not present

## 2021-10-14 DIAGNOSIS — E1121 Type 2 diabetes mellitus with diabetic nephropathy: Secondary | ICD-10-CM

## 2021-10-14 DIAGNOSIS — I10 Essential (primary) hypertension: Secondary | ICD-10-CM

## 2021-10-14 DIAGNOSIS — Z794 Long term (current) use of insulin: Secondary | ICD-10-CM

## 2021-10-14 NOTE — Progress Notes (Signed)
Subjective  CC:  Chief Complaint  Patient presents with   Hospitalization Follow-up    Pt here for a Hospital F/U from 10/10/2021 from pneumonia     HPI: [name] is a 88 y.o. male who presents to the office today to address the problems listed above in the chief complaint. 88 year old here for hospital follow-up, most recently discharged August 14 for evaluation August 12.  This was a return admission.  He was previously in the hospital from August 6 through August 9.  To briefly summarize, it all started back on August 1 with a left neck mass that was treated lymphadenitis.  However patient's clinical status declined and he developed significant shortness of breath and malaise.  He was found to have right-sided multilobular pneumonia with reactive lymph nodes by CT scan of the neck and chest CT.  He was treated aggressively for sepsis in the hospital on his first admission with IV antibiotics he did respond well.  He was discharged, however over the next several days he had worsening malaise and developed worsening shortness of breath and worsening Lower extremity edema.  He has chronic mild chronic venous insufficiency that he takes Lasix daily for but swelling had increased significantly. This last hospitalization for, he was treated for acute on chronic diastolic heart failure with diuresis.  Echocardiogram showed no systolic dysfunction.  He will maintain on his regular cardiac medications.  He was given the opportunity to use increased dose of Lasix in the afternoon if needed.  He reports that his swelling persists but is not worsening.  There is no redness or pain. Multifocal pneumonia was improving clinically and by radiographic imaging.  He has completed full course of antibiotics.  He feels that shortness of breath has resolved.  No pleuritic chest pain.  No fevers.  Does need a follow-up chest pain to ensure complete resolution.  He did have a modified barium swallow which did not  show any swallowing defects. Diabetes has been fairly well controlled.  His appetite is good and he continues to eat.  He continues his insulin and fasting levels are normal, today's was 115. He and his wife are quite stressed.  His wife is tearful.  They have many questions regarding medications, sodium levels of diet, diet changes etc.   Assessment  1. Multifocal pneumonia   2. Acute on chronic diastolic congestive heart failure ([place])   3. Type 2 diabetes mellitus with diabetic nephropathy, with long-term current use of insulin ([place])   4. Essential (primary) hypertension      Plan  Multifocal pneumonia and acute on chronic diastolic heart failure: Overall clinically improved.  Reassured.  Recommend rest, good nutrition, low-sodium diet and leg elevation as needed.  He may use Lasix as recommended.  Educated thoroughly.  We will recheck a chest x-ray in 4 to 6 weeks from the initial x-ray to ensure complete resolution of infiltrates.  He will notify me if he has any further shortness of breath or chest pain or malaise or fevers.  Continue medications as written in discharge summary.  I went over each medication with both family members. Also discussed future planning.  I recommend they start thinking about the future they may need more help in the home.  He has a very supportive family.  Follow up: 4 to 6 weeks to recheck 10/27/2021  Orders Placed This Encounter  Procedures   DG Chest 2 View   No orders of the defined types were placed in this  encounter.     I reviewed the patients updated PMH, FH, and SocHx.    Patient Active Problem List   Diagnosis Date Noted   Mild cognitive impairment 03/05/2021    Priority: High   Moderate nonproliferative diabetic retinopathy of left eye ([place]) 06/18/2020    Priority: High   Stage 3b chronic kidney disease ([place]) 05/26/2020    Priority: High   Aortic stenosis, moderate 05/22/2019    Priority: High   Chronic diastolic CHF (congestive heart  failure) ([place]) 03/14/2019    Priority: High   Spinal stenosis of lumbar region 02/21/2018    Priority: High   Diabetic peripheral neuropathy associated with type 2 diabetes mellitus ([place]) 05/05/2017    Priority: High   Atherosclerotic heart disease of native coronary artery without angina pectoris 06/13/2016    Priority: High   CVA (cerebral vascular accident) ([place]) 06/06/2016    Priority: High   Recurrent coronary arteriosclerosis after percutaneous transluminal coronary angioplasty 04/18/2015    Priority: High   CAD S/P PCI- Nov 2016     Priority: High   Current use of long term anticoagulation 02/06/2013    Priority: High   Chronic atrial fibrillation ([place]) 07/30/2011    Priority: High   Hx of CABG x 2 1990     Priority: High   Essential (primary) hypertension     Priority: High   Diabetes mellitus with diabetic nephropathy, with long-term current use of insulin ([facility])     Priority: High   Mixed hyperlipidemia     Priority: High   Thoracic aortic aneurysm without rupture ([place]) 05/26/2020    Priority: Medium    Degeneration of lumbar intervertebral disc 02/21/2018    Priority: Medium    Chronic vertigo 03/15/2014    Priority: Medium    Obesity (BMI 30.0-34.9) 01/31/2012    Priority: Medium    Enlarged prostate without lower urinary tract symptoms (luts) 01/04/2011    Priority: Medium    Gastro-esophageal reflux disease without esophagitis 12/07/2010    Priority: Medium    Epistaxis, recurrent 04/11/2015    Priority: Low   Osteoarthritis, knee 01/31/2012    Priority: Low   Allergic rhinitis 10/28/2011    Priority: Low   Hearing loss 06/08/2011    Priority: Low   Primary osteoarthritis of hand 06/08/2011    Priority: Low   Benign hematuria 08/27/2008    Priority: Low   Acute dyspnea 10/10/2021   Acute on chronic diastolic congestive heart failure ([place]) 10/10/2021   Hypokalemia 10/10/2021   Normocytic anemia 10/10/2021   Sepsis due to pneumonia ([place]) 10/04/2021    Neck mass 10/04/2021   Stable hemispheric central retinal vein occlusion (CRVO) of right eye 07/21/2020   Lower extremity edema 03/14/2019   Multifocal pneumonia 06/02/2016   Current Meds  Medication Sig   amoxicillin-clavulanate (AUGMENTIN) 875-125 MG tablet Take 1 tablet by mouth 2 (two) times daily for 3 days.   apixaban (ELIQUIS) 5 MG TABS tablet Take 1 tablet (5 mg total) by mouth 2 (two) times daily.   atorvastatin (LIPITOR) 40 MG tablet Take 1 tablet (40 mg total) by mouth daily at 6 PM.   AVODART 0.5 MG capsule Take 0.5 mg by mouth See admin instructions. Take 0.5 mg by mouth every other night- at bedtime   Cholecalciferol (VITAMIN D3 PO) Take 1 capsule by mouth daily.   diltiazem (TIAZAC) 360 MG 24 hr capsule Take 360 mg by mouth daily.   empagliflozin (JARDIANCE) 25 MG TABS tablet Take 1  tablet (25 mg total) by mouth daily before breakfast.   furosemide (LASIX) 40 MG tablet Take 1 tablet (40 mg total) by mouth every morning. May also take 1 tablet (40 mg total) daily as needed for fluid or edema (can take second dose in the afternoon (at least 6 hours after morning dose) for fluid or swelling). TAKE 1 TABLET(40 MG) BY MOUTH DAILY.   gabapentin (NEURONTIN) 300 MG capsule TAKE 1 CAPSULE(300 MG) BY MOUTH THREE TIMES DAILY (Patient taking differently: Take 300 mg by mouth 3 (three) times daily.)   insulin glargine (LANTUS) 100 UNIT/ML injection Inject 40 Units into the skin every morning.   metFORMIN (GLUCOPHAGE) 500 MG tablet Take 500 mg by mouth 2 (two) times daily with a meal.   metoprolol succinate (TOPROL-XL) 100 MG 24 hr tablet Take 1 tablet (100 mg total) by mouth daily. Take with or immediately following a meal. (Patient taking differently: Take 100 mg by mouth in the morning.)   Multiple Vitamin (MULTIVITAMIN) tablet Take 1 tablet by mouth daily with breakfast.   nitroGLYCERIN (NITROSTAT) 0.4 MG SL tablet Place 0.4 mg under the tongue every 5 (five) minutes as needed for chest pain  (x 3 doses).   pantoprazole (PROTONIX) 40 MG tablet TAKE 1 TABLET BY MOUTH DAILY AT NOON (Patient taking differently: Take 40 mg by mouth daily in the afternoon.)   potassium chloride SA (KLOR-CON) 20 MEQ tablet TAKE 1 TABLET(20 MEQ) BY MOUTH DAILY (Patient taking differently: Take 20 mEq by mouth daily.)   tamsulosin (FLOMAX) 0.4 MG CAPS capsule Take 0.4 mg by mouth every other day.   TYLENOL 500 MG tablet Take 500-1,000 mg by mouth every 6 (six) hours as needed for mild pain or headache.    Allergies: Patient is allergic to septra [sulfamethoxazole-trimethoprim], cefadroxil, ciprofloxacin, and erythromycin. Family History: Patient family history includes Brain cancer in his father; Throat cancer in his brother. Social History:  Patient  reports that he quit smoking about 63 years ago. His smoking use included cigarettes. He has a 30.00 pack-year smoking history. He has never used smokeless tobacco. He reports that he does not drink alcohol and does not use drugs.  Review of Systems: Constitutional: Negative for fever malaise or anorexia Cardiovascular: negative for chest pain Respiratory: negative for SOB or persistent cough Gastrointestinal: negative for abdominal pain  Objective  Vitals: BP 136/80   Pulse 69   Temp 98.7 F (37.1 C)   Ht '5\' 6"'$  (1.676 m)   Wt 198 lb (89.8 kg)   SpO2 96%   BMI 31.96 kg/m  General: no acute distress , A&Ox3, sitting comfortably in wheelchair.  Good insight, no respiratory distress HEENT: PEERL, conjunctiva normal, neck is supple Cardiovascular:  RRR without murmur or gallop.  Respiratory:  Good breath sounds bilaterally, CTAB with normal respiratory effort Skin:  Warm, no rashes Left greater than right pitting edema bilateral lower extremities.  No warmth or redness.  Nontender    Commons side effects, risks, benefits, and alternatives for medications and treatment plan prescribed today were discussed, and the patient expressed understanding of  the given instructions. Patient is instructed to call or message via MyChart if he/she has any questions or concerns regarding our treatment plan. No barriers to understanding were identified. We discussed Red Flag symptoms and signs in detail. Patient expressed understanding regarding what to do in case of urgent or emergency type symptoms.  Medication list was reconciled, printed and provided to the patient in AVS. Patient instructions and summary  information was reviewed with the patient as documented in the AVS. This note was prepared with assistance of Dragon voice recognition software. Occasional wrong-word or sound-a-like substitutions may have occurred due to the inherent limitations of voice recognition software  This visit occurred during the SARS-CoV-2 public health emergency.  Safety protocols were in place, including screening questions prior to the visit, additional usage of staff PPE, and extensive cleaning of exam room while observing appropriate contact time as indicated for disinfecting solutions.

## 2021-10-14 NOTE — Patient Instructions (Addendum)
Please return in 6 weeks for recheck.   Please go get a chest xray around September 11th.  Please go to our [facility] office to get your xrays done. You can walk in M-F between 8:30am- noon or 1pm - 5pm. Tell them you are there for xrays ordered by me. They will send me the results, then I will let you know the results with instructions.   Address: [address].  The Xray department is located in the basement.    If you have any questions or concerns, please don't hesitate to send me a message via MyChart or call the office at [phone]. Thank you for visiting with [place] today! It's our pleasure caring for you.

## 2021-10-15 LAB — CULTURE, BLOOD (ROUTINE X 2)
Culture: NO GROWTH
Culture: NO GROWTH

## 2021-10-16 ENCOUNTER — Telehealth: Payer: Self-pay | Admitting: Family Medicine

## 2021-10-16 DIAGNOSIS — E669 Obesity, unspecified: Secondary | ICD-10-CM | POA: Diagnosis not present

## 2021-10-16 DIAGNOSIS — A419 Sepsis, unspecified organism: Secondary | ICD-10-CM | POA: Diagnosis not present

## 2021-10-16 DIAGNOSIS — N1832 Chronic kidney disease, stage 3b: Secondary | ICD-10-CM | POA: Diagnosis not present

## 2021-10-16 DIAGNOSIS — I350 Nonrheumatic aortic (valve) stenosis: Secondary | ICD-10-CM | POA: Diagnosis not present

## 2021-10-16 DIAGNOSIS — Z6830 Body mass index (BMI) 30.0-30.9, adult: Secondary | ICD-10-CM | POA: Diagnosis not present

## 2021-10-16 DIAGNOSIS — E1122 Type 2 diabetes mellitus with diabetic chronic kidney disease: Secondary | ICD-10-CM | POA: Diagnosis not present

## 2021-10-16 DIAGNOSIS — G3184 Mild cognitive impairment, so stated: Secondary | ICD-10-CM | POA: Diagnosis not present

## 2021-10-16 DIAGNOSIS — Z7901 Long term (current) use of anticoagulants: Secondary | ICD-10-CM | POA: Diagnosis not present

## 2021-10-16 DIAGNOSIS — I7121 Aneurysm of the ascending aorta, without rupture: Secondary | ICD-10-CM | POA: Diagnosis not present

## 2021-10-16 DIAGNOSIS — I5032 Chronic diastolic (congestive) heart failure: Secondary | ICD-10-CM | POA: Diagnosis not present

## 2021-10-16 DIAGNOSIS — I2510 Atherosclerotic heart disease of native coronary artery without angina pectoris: Secondary | ICD-10-CM | POA: Diagnosis not present

## 2021-10-16 DIAGNOSIS — Z9181 History of falling: Secondary | ICD-10-CM | POA: Diagnosis not present

## 2021-10-16 DIAGNOSIS — Z794 Long term (current) use of insulin: Secondary | ICD-10-CM | POA: Diagnosis not present

## 2021-10-16 DIAGNOSIS — D631 Anemia in chronic kidney disease: Secondary | ICD-10-CM | POA: Diagnosis not present

## 2021-10-16 DIAGNOSIS — H348112 Central retinal vein occlusion, right eye, stable: Secondary | ICD-10-CM | POA: Diagnosis not present

## 2021-10-16 DIAGNOSIS — J189 Pneumonia, unspecified organism: Secondary | ICD-10-CM | POA: Diagnosis not present

## 2021-10-16 DIAGNOSIS — E113392 Type 2 diabetes mellitus with moderate nonproliferative diabetic retinopathy without macular edema, left eye: Secondary | ICD-10-CM | POA: Diagnosis not present

## 2021-10-16 DIAGNOSIS — Z7984 Long term (current) use of oral hypoglycemic drugs: Secondary | ICD-10-CM | POA: Diagnosis not present

## 2021-10-16 DIAGNOSIS — I110 Hypertensive heart disease with heart failure: Secondary | ICD-10-CM | POA: Diagnosis not present

## 2021-10-16 DIAGNOSIS — Z951 Presence of aortocoronary bypass graft: Secondary | ICD-10-CM | POA: Diagnosis not present

## 2021-10-16 DIAGNOSIS — E1142 Type 2 diabetes mellitus with diabetic polyneuropathy: Secondary | ICD-10-CM | POA: Diagnosis not present

## 2021-10-16 DIAGNOSIS — E876 Hypokalemia: Secondary | ICD-10-CM | POA: Diagnosis not present

## 2021-10-16 DIAGNOSIS — M48061 Spinal stenosis, lumbar region without neurogenic claudication: Secondary | ICD-10-CM | POA: Diagnosis not present

## 2021-10-16 DIAGNOSIS — I482 Chronic atrial fibrillation, unspecified: Secondary | ICD-10-CM | POA: Diagnosis not present

## 2021-10-16 NOTE — Progress Notes (Unsigned)
Cardiology Office Note   Date:  10/22/2021   ID:  Kishawn Pickar, DOB [birth year], MRN [id number]  PCP:  [name], MD  Cardiologist:  Tangala Wiegert Martin[place], MD EP: None  Chief Complaint  Patient presents with   Congestive Heart Failure       History of Present Illness: [name] is a 88 y.o. male with PMH of CAD s/p CABG in 1990 with subsequent DES to RCA in 2006, permanent atrial fibrillation, chronic diastolic CHF, aortic stenosis, HTN, HLD, carotid artery disease, DM type 2, CVA, who presents for  follow-up.   He was  admitted to the hospital 05/13/19-05/18/19 for acute respiratory failure 2/2 PNA and pleural effusion, managed with IV antibiotics and a thoracentesis. Cardiology followed that admission for assistance with atrial fibrillation with RVR and he was discharged home on diltiazem '360mg'$  and metoprolol succinate '100mg'$  BID for rate control, apixaban '5mg'$  BID for stroke ppx, and lasix '40mg'$  daily for CHF. Echo 05/14/19 showed EF 60-65%, moderate concentric LVH, indeterminate LV diastolic function, mild AI, moderate AS, and ascending aortic aneurysm of 4.8cm (up from 4.3cm 09/2018).   He tripped and fell on Feb 11 with soft tissue injury to his left hand and a lateral rib fracture. He was initially taking oxycodone for pain but felt this was too strong. Then switched to hydrocodone and has been resting well with this. His PCP Dr Jonni Sanger has been out of the office and he needs some over the weekend until she returns.   He was admitted 8/6-10/07/21 with sepsis due to PNA. Managed with antibiotics. He was readmitted on 8/12-8/14 with edema and some CHF. He was continued on therapy for PNA. Swallowing evaluation was negative for aspiration. BNP 207.5. Echocardiogram with EF 55 to 60%, no regional wall motion abnormality, increasing right ventricular pressure and volume overload.  No significant change from previous. Responded to diuretics. DC on lasix 40 mg bid.   On follow up today he is  doing well. Complete resolution of edema. Breathing is good. No chest pain. He is scheduled for a follow up CXR with Dr Jonni Sanger.    Past Medical History:  Diagnosis Date   Abdominal pain    Acute renal failure ([place])     resolved   Arthritis    "hands & legs" (11/'10/2014)   Ataxia    Atrial fibrillation (HCC)    Bladder outlet obstruction    Bladder outlet obstruction    BPH (benign prostatic hyperplasia)    CAD (coronary artery disease)    a. CABG IN 1989. b. 01/08/2015 CTO of ost LAD, LIMA to LAD not visualized but assumed patent given myoview finding, occluded SVG to diagonal, 99% mid RCA tx w/ SYNERGY DES 3X28 mm   Constipation    Diabetes mellitus type 2, insulin dependent ([place])    Epistaxis, recurrent Feb 2017   GERD (gastroesophageal reflux disease)    HTN (hypertension)    Hx of bacterial pneumonia    Hyperlipemia    Kidney stones    Mild aortic stenosis    Mild cognitive impairment 03/05/2021   MMSE 26/30 and 6CIT 9 03/2021 AWV   Pneumonia 04/2019   Rib fractures    left rib fractures being treated with pain medications   Stented coronary artery Nov 2016   RCA DES   Urinary tract infection     Enterococcus    Past Surgical History:  Procedure Laterality Date   [place] N/A 01/08/2015  Procedure: Left Heart Cath and Cors/Grafts Angiography;  Surgeon: Pat Elicker M Martin[place], MD;  Location: [facility];  Service: Cardiovascular;  Laterality: N/A;   CARDIAC CATHETERIZATION  01/08/2015   Procedure: Coronary Stent Intervention;  Surgeon: Asja Frommer M [place], MD;  Location: [facility];  Service: Cardiovascular;;   CARPAL TUNNEL RELEASE Right 08/2010   CATARACT EXTRACTION W/ INTRAOCULAR LENS  IMPLANT, BILATERAL Bilateral    CORONARY ANGIOPLASTY  01/08/15   RCA DES   CORONARY ARTERY BYPASS GRAFT  1989   "CABG X 2"   IR THORACENTESIS ASP PLEURAL SPACE W/IMG GUIDE  05/14/2019   JOINT REPLACEMENT     TONSILLECTOMY     TOTAL HIP  ARTHROPLASTY Right 2000     Current Outpatient Medications  Medication Sig Dispense Refill   apixaban (ELIQUIS) 5 MG TABS tablet Take 1 tablet (5 mg total) by mouth 2 (two) times daily. 60 tablet 2   atorvastatin (LIPITOR) 40 MG tablet Take 1 tablet (40 mg total) by mouth daily at 6 PM. 30 tablet 2   AVODART 0.5 MG capsule Take 0.5 mg by mouth See admin instructions. Take 0.5 mg by mouth every other night- at bedtime     Cholecalciferol (VITAMIN D3 PO) Take 1 capsule by mouth daily.     diltiazem (TIAZAC) 360 MG 24 hr capsule Take 360 mg by mouth daily.     empagliflozin (JARDIANCE) 25 MG TABS tablet Take 1 tablet (25 mg total) by mouth daily before breakfast. 30 tablet    furosemide (LASIX) 40 MG tablet Take 1 tablet (40 mg total) by mouth every morning. May also take 1 tablet (40 mg total) daily as needed for fluid or edema (can take second dose in the afternoon (at least 6 hours after morning dose) for fluid or swelling). TAKE 1 TABLET(40 MG) BY MOUTH DAILY. 60 tablet 0   gabapentin (NEURONTIN) 300 MG capsule TAKE 1 CAPSULE(300 MG) BY MOUTH THREE TIMES DAILY (Patient taking differently: Take 300 mg by mouth 3 (three) times daily.) 270 capsule 3   insulin glargine (LANTUS) 100 UNIT/ML injection Inject 40 Units into the skin every morning.     metFORMIN (GLUCOPHAGE) 500 MG tablet Take 500 mg by mouth 2 (two) times daily with a meal.     metoprolol succinate (TOPROL-XL) 100 MG 24 hr tablet Take 1 tablet (100 mg total) by mouth daily. Take with or immediately following a meal. (Patient taking differently: Take 100 mg by mouth in the morning.) 180 tablet 2   Multiple Vitamin (MULTIVITAMIN) tablet Take 1 tablet by mouth daily with breakfast.     nitroGLYCERIN (NITROSTAT) 0.4 MG SL tablet Place 0.4 mg under the tongue every 5 (five) minutes as needed for chest pain (x 3 doses).     pantoprazole (PROTONIX) 40 MG tablet TAKE 1 TABLET BY MOUTH DAILY AT NOON (Patient taking differently: Take 40 mg by  mouth daily in the afternoon.) 90 tablet 3   potassium chloride SA (KLOR-CON) 20 MEQ tablet TAKE 1 TABLET(20 MEQ) BY MOUTH DAILY (Patient taking differently: Take 20 mEq by mouth daily.) 90 tablet 3   tamsulosin (FLOMAX) 0.4 MG CAPS capsule Take 0.4 mg by mouth every other day.     TYLENOL 500 MG tablet Take 500-1,000 mg by mouth every 6 (six) hours as needed for mild pain or headache.     No current facility-administered medications for this visit.    Allergies:   Septra [sulfamethoxazole-trimethoprim], Cefadroxil, Ciprofloxacin, and Erythromycin    Social  History:  The patient  reports that he quit smoking about 63 years ago. His smoking use included cigarettes. He has a 30.00 pack-year smoking history. He has never used smokeless tobacco. He reports that he does not drink alcohol and does not use drugs.   Family History:  The patient's family history includes Brain cancer in his father; Throat cancer in his brother.    ROS:  Please see the history of present illness.   Otherwise, review of systems are positive for none.   All other systems are reviewed and negative.    PHYSICAL EXAM: VS:  BP 126/68   Pulse (!) 113   Ht '5\' 6"'$  (1.676 m)   Wt 200 lb 6.4 oz (90.9 kg)   SpO2 98%   BMI 32.35 kg/m  , BMI Body mass index is 32.35 kg/m. GEN: Well nourished, well developed, in no acute distress. Seen in a wheelchair.  HEENT: normal Neck: no JVD, carotid bruits, or masses Cardiac: IRIR; gr 2/6 systolic murmur RUSB>>apex, rubs, or gallops, no  LE edema  Respiratory:  clear to auscultation bilaterally, normal work of breathing GI: soft, nontender, nondistended, + BS MS: no deformity or atrophy. No biceps tenderness Skin: warm and dry, no rash Neuro:  Strength and sensation are intact Psych: euthymic mood, full affect   EKG:  EKG is  not ordered today.      Recent Labs: 10/10/2021: ALT 33; B Natriuretic Peptide 207.5; Magnesium 1.8 10/12/2021: BUN 19; Creatinine, Ser 1.07;  Hemoglobin 9.0; Platelets 381; Potassium 4.1; Sodium 142    Lipid Panel    Component Value Date/Time   CHOL 154 04/14/2021 1200   TRIG 119.0 04/14/2021 1200   HDL 54.70 04/14/2021 1200   CHOLHDL 3 04/14/2021 1200   VLDL 23.8 04/14/2021 1200   LDLCALC 75 04/14/2021 1200      Wt Readings from Last 3 Encounters:  10/22/21 200 lb 6.4 oz (90.9 kg)  10/14/21 198 lb (89.8 kg)  10/12/21 206 lb 5.6 oz (93.6 kg)      Other studies Reviewed: Additional studies/ records that were reviewed today include:   2D echo 05/14/2019 IMPRESSIONS    1. Left ventricular ejection fraction, by estimation, is 60 to 65%. The  left ventricle has normal function. The left ventricle has no regional  wall motion abnormalities. There is moderate concentric left ventricular  hypertrophy. Left ventricular  diastolic parameters are indeterminate.   2. Right ventricular systolic function is normal. The right ventricular  size is normal.   3. The mitral valve is normal in structure. No evidence of mitral valve  regurgitation. No evidence of mitral stenosis.   4. The aortic valve is normal in structure. Aortic valve regurgitation is  mild. Moderate aortic valve stenosis. Aortic valve area, by VTI measures  1.27 cm. Aortic valve mean gradient measures 19.3 mmHg. Aortic valve Vmax  measures 2.75 m/s.   5. Compared with th echo 09/2018, ascending aorta has increased from 4.3  cm to 4.8cm. . Aortic dilatation noted. There is moderate dilatation of  the ascending aorta measuring 48 mm.   6. The inferior vena cava is normal in size with greater than 50%  respiratory variability, suggesting right atrial pressure of 3 mmHg.   Left heart catheterization 2016: Prox RCA lesion, 40% stenosed. LM lesion, 20% stenosed. Ost LAD to Prox LAD lesion, 100% stenosed. SVG . Origin lesion, 100% stenosed. Mid RCA-2 lesion, 70% stenosed. Mid RCA-1 lesion, 99% stenosed. Post intervention, there is a 0% residual stenosis.  1. Severe 2 vessel obstructive CAD. CTO of the origin of the LAD. Critical mid RCA stenosis. 2. Occluded SVG to the diagonal 3. LIMA to the LAD was not visualized but assumed patent based on clinical history and Myoview findings. 4. Normal LV EDP. 5. Successful stenting of the Mid RCA with DES. Very difficult procedure due to vessel tortuosity.   Plan: DAPT with ASA and Plavix for one month then stop ASA and continue Plavix for at least one year. Resume Coumadin tomorrow. Will assess LV function with an Echo. Stop prilosec and start protonix. Anticipate DC in am if stable. Patient noted to be bradycardic throughout case so will reduce metoprolol to 50 mg daily.  CT CHEST WITHOUT CONTRAST   TECHNIQUE: Multidetector CT imaging of the chest was performed following the standard protocol without IV contrast.   COMPARISON:  CT angiography from June 02, 2016 and from October 03, 2017   FINDINGS: Cardiovascular: Post median sternotomy for CABG. Ascending thoracic aorta measuring 4.3 cm with stable appearance. Calcified atheromatous plaque of the thoracic aorta and its branches in the chest.   Heart size is stable, extensive calcified coronary artery disease post CABG and LIMA grafting.   Engorged central pulmonary vasculature 3.2 cm caliber of the main pulmonary artery Limited assessment of cardiovascular structures given lack of intravenous contrast.   Mediastinum/Nodes: Thoracic inlet structures are normal. No axillary lymphadenopathy. No mediastinal lymphadenopathy. No gross hilar lymphadenopathy.   Lungs/Pleura: Bandlike opacity with mild ground-glass attenuation in the LEFT lung base. Bandlike changes are present in the LEFT lower lobe. Resolution of pleural fluid seen on previous chest x-rays from March of 2021   Pulmonary nodule in the superior segment of the RIGHT lower lobe 6 mm unchanged from previous imaging (image 49, series 3) no consolidation. No pleural effusion.    (Image 72, series 3) 4 mm nodule with some surrounding ground-glass opacity in the central LEFT lower lobe   Airways are patent.   Upper Abdomen: Incidental imaging of upper abdominal contents with signs of vascular disease of the abdominal aorta. Small hiatal hernia. No acute upper abdominal process.   Musculoskeletal: No acute bone finding. No destructive bone process.   IMPRESSION: 1. Stable appearance of the mild aneurysmal dilation ascending thoracic aorta measuring 4.3 cm. Recommend annual imaging followup by CTA or MRA. This recommendation follows 2010 ACCF/AHA/AATS/ACR/ASA/SCA/SCAI/SIR/STS/SVM Guidelines for the Diagnosis and Management of Patients with Thoracic Aortic Disease. Circulation. 2010; 121: P102-H852. Aortic aneurysm NOS (ICD10-I71.9) 2. Stable 6 mm nodule in the superior segment of the RIGHT lower lobe. 3. 4 mm nodule with some surrounding ground-glass opacity in the central LEFT lower lobe. This is new compared to prior imaging. No follow-up needed if patient is low-risk. Non-contrast chest CT can be considered in 12 months if patient is high-risk. This recommendation follows the consensus statement: Guidelines for Management of Incidental Pulmonary Nodules Detected on CT Images: From the Fleischner Society 2017; Radiology 2017; 284:228-243. 4. Resolution of pleural fluid seen on previous chest x-rays from March of 2021. Bandlike opacity in the area of prior LEFT lower lobe airspace disease suggests mixture of atelectasis and post infectious scarring. 5. Stable signs of vascular disease of the thoracic aorta and its branches in the chest. 6. Aortic atherosclerosis.   Aortic Atherosclerosis (ICD10-I70.0).   Aortic aneurysm NOS (ICD10-I71.9).     Electronically Signed   By: Zetta Bills M.D.   On: 02/05/2020 08:23  Echo 06/10/20: IMPRESSIONS     1. Left ventricular ejection fraction,  by estimation, is 60 to 65%. The  left ventricle has normal  function. The left ventricle has no regional  wall motion abnormalities. There is moderate left ventricular hypertrophy.  Left ventricular diastolic function   could not be evaluated.   2. Right ventricular systolic function is mildly reduced. The right  ventricular size is normal. There is moderately elevated pulmonary artery  systolic pressure.   3. Left atrial size was severely dilated.   4. Right atrial size was moderately dilated.   5. The mitral valve is abnormal. Trivial mitral valve regurgitation.   6. The aortic valve is tricuspid. There is moderate calcification of the  aortic valve. Aortic valve regurgitation is trivial. Mild to moderate  aortic valve stenosis. Aortic valve area, by VTI measures 1.47 cm. Aortic  valve mean gradient measures 18.0  mmHg. Aortic valve Vmax measures 2.78 m/s. DI is 0.30.   7. Aortic dilatation noted. There is mild dilatation of the aortic root,  measuring 43 mm. There is mild dilatation of the ascending aorta,  measuring 43 mm.   8. The inferior vena cava is normal in size with greater than 50%  respiratory variability, suggesting right atrial pressure of 3 mmHg.   Comparison(s): No significant change from prior study. 05/14/2019: LVEF  60-65%, moderate LVH, moderate AS - mean gradient 19.3 mmHg.  IMPRESSIONS     1. Left ventricular ejection fraction, by estimation, is 55 to 60%. The  left ventricle has normal function. The left ventricle has no regional  wall motion abnormalities. There is moderate concentric left ventricular  hypertrophy. Left ventricular  diastolic function could not be evaluated. There is the interventricular  septum is flattened in systole and diastole, consistent with right  ventricular pressure and volume overload.   2. Right ventricular systolic function is mildly reduced. The right  ventricular size is mildly enlarged. There is normal pulmonary artery  systolic pressure. The estimated right ventricular systolic  pressure is  63.0 mmHg.   3. Left atrial size was mildly dilated.   4. The mitral valve is degenerative. Trivial mitral valve regurgitation.  No evidence of mitral stenosis.   5. The aortic valve is tricuspid. There is moderate calcification of the  aortic valve. There is moderate thickening of the aortic valve. Aortic  valve regurgitation is trivial. Mild to moderate aortic valve stenosis.  Aortic valve area, by VTI measures  1.05 cm. Aortic valve mean gradient measures 19.3 mmHg. Aortic valve Vmax  measures 2.92 m/s.   6. Asc aorta up to 40 mm on this study. 48 mm on last study. CT scan  recorded 43 mm. Would defer to cross sectional imaging (CT). There is mild  dilatation of the ascending aorta, measuring 40 mm.   7. The inferior vena cava is normal in size with greater than 50%  respiratory variability, suggesting right atrial pressure of 3 mmHg.   Comparison(s): No significant change from prior study.  Echo 10/10/21:   ASSESSMENT AND PLAN:  1. CAD s/p CABG in 1990 with DES to RCA in 2006: Not on aspirin given need for anticoagulation - Continue statin - Continue metoprolol  2. Permanent atrial fibrillation: HR is well controlled.  - No bleeding - continue metoprolol and diltiazem  - Continue eliquis for stroke ppx. (does not meet requirements for reduced dose)  3. Acute on Chronic diastolic CHF: exacerbated recently after treatment for PNA and sepsis.  - Continue lasix '40mg'$   bid and potassium - Continue metoprolol succinate - restrict salt intake -  will check BMET and CBC today - f/u CXR with Dr Jonni Sanger  4. HTN: BP is well controlled - Continue metoprolol succinate, diltiazem, and lasix  5. Aortic stenosis: moderate on echo 05/14/19- repeat in August is unchanged.  - Continue annual surveillance echo's   6. Ascending aortic aneurysm: last echo 05/14/19 showed 4.8cm aneurysm, up from 4.3 09/2018. More recent CT 02/05/20 showed it was 4.3 cm. Stable. Stable by recent Echo.    7. PNA- clinically Improved       Disposition:   FU 3 months   Signed, Leinani Lisbon Martin[place], MD  10/22/2021 10:02 AM

## 2021-10-16 NOTE — Telephone Encounter (Signed)
3 items to be addressed.  [name].Home Health Verbal Orders  Agency:   [facility]: Jerolyn Shin [phone]    Reason for Request:   #1 PT Eval and Treatment  #2 OT Clarification for orders: 1x/week for 3 weeks  #3 S.O.C. "Status of Care" approval orders

## 2021-10-19 ENCOUNTER — Encounter: Payer: Self-pay | Admitting: *Deleted

## 2021-10-19 ENCOUNTER — Ambulatory Visit: Payer: Self-pay | Admitting: *Deleted

## 2021-10-19 ENCOUNTER — Ambulatory Visit: Payer: Medicare Other | Admitting: Family Medicine

## 2021-10-19 DIAGNOSIS — I110 Hypertensive heart disease with heart failure: Secondary | ICD-10-CM | POA: Diagnosis not present

## 2021-10-19 DIAGNOSIS — I482 Chronic atrial fibrillation, unspecified: Secondary | ICD-10-CM | POA: Diagnosis not present

## 2021-10-19 DIAGNOSIS — I5032 Chronic diastolic (congestive) heart failure: Secondary | ICD-10-CM | POA: Diagnosis not present

## 2021-10-19 DIAGNOSIS — E1122 Type 2 diabetes mellitus with diabetic chronic kidney disease: Secondary | ICD-10-CM | POA: Diagnosis not present

## 2021-10-19 DIAGNOSIS — A419 Sepsis, unspecified organism: Secondary | ICD-10-CM | POA: Diagnosis not present

## 2021-10-19 DIAGNOSIS — J189 Pneumonia, unspecified organism: Secondary | ICD-10-CM | POA: Diagnosis not present

## 2021-10-19 NOTE — Patient Outreach (Signed)
  Care Coordination   Initial Visit Note   10/19/2021 Name: [name] MRN: [id number] DOB: 16-Dec-1928  [name] is a 88 y.o. year old male who sees Jeffrey Arnt, MD for primary care. I spoke with  [name] by phone today  What matters to the patients health and wellness today?  PNA education and f/u    Goals Addressed               This Visit's Progress     No needs however open to Pneumonia education and f/u call (pt-stated)        Care Coordination Interventions: Provided education to patient re: Pneumonia and what to avoid to prevent readmissions. Also educated on urgent care visit if not emergent on his symptoms or unable to obtain an appointment with his provider. Will also send educational information via MyChart as agreed.  Reviewed medications with patient and discussed purpose of his medications. Discussed plans with patient for ongoing care management follow up and provided patient with direct contact information for care management team Screening for signs and symptoms of depression related to chronic disease state  Assessed social determinant of health barriers         SDOH assessments and interventions completed:  Yes  SDOH Interventions Today    Flowsheet Row Most Recent Value  SDOH Interventions   Food Insecurity Interventions Intervention Not Indicated  Housing Interventions Intervention Not Indicated  Transportation Interventions Intervention Not Indicated        Care Coordination Interventions Activated:  Yes  Care Coordination Interventions:  Yes, provided   Follow up plan: Follow up call scheduled for 11/04/2021 '@1'$ :30 PM    Encounter Outcome:  Pt. Visit Completed   Jeffrey Mina, RN Care Management Coordinator [place] Office [phone]

## 2021-10-19 NOTE — Patient Instructions (Signed)
Visit Information  Thank you for taking time to visit with me today. Please don't hesitate to contact me if I can be of assistance to you.   Following are the goals we discussed today:   Goals Addressed               This Visit's Progress     No needs however open to Pneumonia education and f/u call (pt-stated)        Care Coordination Interventions: Provided education to patient re: Pneumonia and what to avoid to prevent readmissions. Also educated on urgent care visit if not emergent on his symptoms or unable to obtain an appointment with his provider. Will also send educational information via MyChart as agreed.  Reviewed medications with patient and discussed purpose of his medications. Discussed plans with patient for ongoing care management follow up and provided patient with direct contact information for care management team Screening for signs and symptoms of depression related to chronic disease state  Assessed social determinant of health barriers         Our next appointment is by telephone on 11/04/2021 at 1:30 PM  Please call the care guide team at [phone] if you need to cancel or reschedule your appointment.   If you are experiencing a Mental Health or [place] or need someone to talk to, please call the Suicide and Crisis Lifeline: 988  Patient verbalizes understanding of instructions and care plan provided today and agrees to view in [place]. Active MyChart status and patient understanding of how to access instructions and care plan via MyChart confirmed with patient.     The patient has been provided with contact information for the care management team and has been advised to call with any health related questions or concerns.   Raina Mina, RN Care Management Coordinator [place] Office [phone]

## 2021-10-21 ENCOUNTER — Telehealth: Payer: Self-pay | Admitting: Family Medicine

## 2021-10-21 NOTE — Telephone Encounter (Signed)
Spoke with L[place] to ok with verbal orders and the F2F notes has been faxed over to [facility] to [employer] @ [phone]

## 2021-10-21 NOTE — Telephone Encounter (Signed)
Verbal orders were given to [place] and F2F has been faxed over to [employer] @ [phone]

## 2021-10-21 NOTE — Telephone Encounter (Signed)
..  Home Health Verbal Orders  Agency:  [place] [facility]  Caller: Coleman and title  [phone]  -  PT  Requesting PT:    Reason for Request:  Plan of Care and Frequency Approval  Frequency:  2 x per week for 2 weeks, then 1 x per week for 2 weeks  HH needs F2F w/in last 30 days

## 2021-10-22 ENCOUNTER — Ambulatory Visit (INDEPENDENT_AMBULATORY_CARE_PROVIDER_SITE_OTHER): Payer: Medicare Other | Admitting: Cardiology

## 2021-10-22 ENCOUNTER — Encounter: Payer: Self-pay | Admitting: Cardiology

## 2021-10-22 VITALS — BP 126/68 | HR 113 | Ht 66.0 in | Wt 200.4 lb

## 2021-10-22 DIAGNOSIS — A419 Sepsis, unspecified organism: Secondary | ICD-10-CM | POA: Diagnosis not present

## 2021-10-22 DIAGNOSIS — Z9861 Coronary angioplasty status: Secondary | ICD-10-CM | POA: Diagnosis not present

## 2021-10-22 DIAGNOSIS — I7121 Aneurysm of the ascending aorta, without rupture: Secondary | ICD-10-CM

## 2021-10-22 DIAGNOSIS — I5033 Acute on chronic diastolic (congestive) heart failure: Secondary | ICD-10-CM

## 2021-10-22 DIAGNOSIS — I10 Essential (primary) hypertension: Secondary | ICD-10-CM | POA: Diagnosis not present

## 2021-10-22 DIAGNOSIS — J189 Pneumonia, unspecified organism: Secondary | ICD-10-CM | POA: Diagnosis not present

## 2021-10-22 DIAGNOSIS — I110 Hypertensive heart disease with heart failure: Secondary | ICD-10-CM | POA: Diagnosis not present

## 2021-10-22 DIAGNOSIS — I2510 Atherosclerotic heart disease of native coronary artery without angina pectoris: Secondary | ICD-10-CM

## 2021-10-22 DIAGNOSIS — I482 Chronic atrial fibrillation, unspecified: Secondary | ICD-10-CM | POA: Diagnosis not present

## 2021-10-22 DIAGNOSIS — E1122 Type 2 diabetes mellitus with diabetic chronic kidney disease: Secondary | ICD-10-CM | POA: Diagnosis not present

## 2021-10-22 DIAGNOSIS — I5032 Chronic diastolic (congestive) heart failure: Secondary | ICD-10-CM | POA: Diagnosis not present

## 2021-10-22 NOTE — Patient Instructions (Signed)
Medication Instructions:  Continue all medications *If you need a refill on your cardiac medications before your next appointment, please call your pharmacy*   Lab Work: Bmet,cbc today   Testing/Procedures: None ordered   Follow-Up: At [facility], you and your health needs are our priority.  As part of our continuing mission to provide you with exceptional heart care, we have created designated Provider Care Teams.  These Care Teams include your primary Cardiologist (physician) and Advanced Practice Providers (APPs -  Physician Assistants and Nurse Practitioners) who all work together to provide you with the care you need, when you need it.  We recommend signing up for the patient portal called "MyChart".  Sign up information is provided on this After Visit Summary.  MyChart is used to connect with patients for Virtual Visits (Telemedicine).  Patients are able to view lab/test results, encounter notes, upcoming appointments, etc.  Non-urgent messages can be sent to your provider as well.   To learn more about what you can do with MyChart, go to [email or web address].    Your next appointment:  3 months  The format for your next appointment: Office    Provider:  [place]

## 2021-10-23 ENCOUNTER — Other Ambulatory Visit: Payer: Self-pay

## 2021-10-23 DIAGNOSIS — E875 Hyperkalemia: Secondary | ICD-10-CM

## 2021-10-23 LAB — CBC WITH DIFFERENTIAL/PLATELET
Basophils Absolute: 0.2 10*3/uL (ref 0.0–0.2)
Basos: 2 %
EOS (ABSOLUTE): 0.5 10*3/uL — ABNORMAL HIGH (ref 0.0–0.4)
Eos: 5 %
Hematocrit: 36.9 % — ABNORMAL LOW (ref 37.5–51.0)
Hemoglobin: 11 g/dL — ABNORMAL LOW (ref 13.0–17.7)
Immature Grans (Abs): 0.1 10*3/uL (ref 0.0–0.1)
Immature Granulocytes: 1 %
Lymphocytes Absolute: 1.7 10*3/uL (ref 0.7–3.1)
Lymphs: 16 %
MCH: 24.3 pg — ABNORMAL LOW (ref 26.6–33.0)
MCHC: 29.8 g/dL — ABNORMAL LOW (ref 31.5–35.7)
MCV: 82 fL (ref 79–97)
Monocytes Absolute: 0.9 10*3/uL (ref 0.1–0.9)
Monocytes: 9 %
Neutrophils Absolute: 7 10*3/uL (ref 1.4–7.0)
Neutrophils: 67 %
Platelets: 605 10*3/uL — ABNORMAL HIGH (ref 150–450)
RBC: 4.52 x10E6/uL (ref 4.14–5.80)
RDW: 15 % (ref 11.6–15.4)
WBC: 10.3 10*3/uL (ref 3.4–10.8)

## 2021-10-23 LAB — BASIC METABOLIC PANEL
BUN/Creatinine Ratio: 15 (ref 10–24)
BUN: 21 mg/dL (ref 10–36)
CO2: 25 mmol/L (ref 20–29)
Calcium: 9.6 mg/dL (ref 8.6–10.2)
Chloride: 100 mmol/L (ref 96–106)
Creatinine, Ser: 1.44 mg/dL — ABNORMAL HIGH (ref 0.76–1.27)
Glucose: 283 mg/dL — ABNORMAL HIGH (ref 70–99)
Potassium: 5.5 mmol/L — ABNORMAL HIGH (ref 3.5–5.2)
Sodium: 141 mmol/L (ref 134–144)
eGFR: 46 mL/min/{1.73_m2} — ABNORMAL LOW (ref >59)

## 2021-10-23 NOTE — Progress Notes (Signed)
Hyperglycemia noted.

## 2021-10-27 ENCOUNTER — Telehealth: Payer: Medicare Other

## 2021-10-28 DIAGNOSIS — E1122 Type 2 diabetes mellitus with diabetic chronic kidney disease: Secondary | ICD-10-CM | POA: Diagnosis not present

## 2021-10-28 DIAGNOSIS — I110 Hypertensive heart disease with heart failure: Secondary | ICD-10-CM | POA: Diagnosis not present

## 2021-10-28 DIAGNOSIS — A419 Sepsis, unspecified organism: Secondary | ICD-10-CM | POA: Diagnosis not present

## 2021-10-28 DIAGNOSIS — J189 Pneumonia, unspecified organism: Secondary | ICD-10-CM | POA: Diagnosis not present

## 2021-10-28 DIAGNOSIS — I482 Chronic atrial fibrillation, unspecified: Secondary | ICD-10-CM | POA: Diagnosis not present

## 2021-10-28 DIAGNOSIS — I5032 Chronic diastolic (congestive) heart failure: Secondary | ICD-10-CM | POA: Diagnosis not present

## 2021-10-30 DIAGNOSIS — E875 Hyperkalemia: Secondary | ICD-10-CM | POA: Diagnosis not present

## 2021-10-30 LAB — BASIC METABOLIC PANEL
BUN/Creatinine Ratio: 14 (ref 10–24)
BUN: 17 mg/dL (ref 10–36)
CO2: 27 mmol/L (ref 20–29)
Calcium: 9.5 mg/dL (ref 8.6–10.2)
Chloride: 103 mmol/L (ref 96–106)
Creatinine, Ser: 1.25 mg/dL (ref 0.76–1.27)
Glucose: 134 mg/dL — ABNORMAL HIGH (ref 70–99)
Potassium: 4.7 mmol/L (ref 3.5–5.2)
Sodium: 141 mmol/L (ref 134–144)
eGFR: 54 mL/min/{1.73_m2} — ABNORMAL LOW (ref >59)

## 2021-11-04 ENCOUNTER — Ambulatory Visit: Payer: Self-pay | Admitting: *Deleted

## 2021-11-04 NOTE — Patient Outreach (Addendum)
  Care Coordination   Follow Up Visit Note   11/04/2021 Name: [name] MRN: [id number] DOB: Jul 15, 1928  [name] is a 88 y.o. year old male who sees Jeffrey Arnt, MD for primary care. I spoke with  [name] by phone today.  What matters to the patients health and wellness today?  No further follow up call needed    Goals Addressed               This Visit's Progress     COMPLETED: No needs however open to Pneumonia education and f/u call (pt-stated)        Care Coordination Interventions: Provided education to patient re: Pneumonia and what to avoid to prevent readmissions. Also educated on urgent care visit if not emergent on his symptoms or unable to obtain an appointment with his provider. Will also send educational information via MyChart as agreed.  Reviewed medications with patient and discussed purpose of his medications. Discussed plans with patient for ongoing care management follow up and provided patient with direct contact information for care management team Screening for signs and symptoms of depression related to chronic disease state  Assessed social determinant of health barriers       9/6 No further follow up needed with previous symptoms resolved for his pneumonia. Pt provided contact to reach RN case manager with any additional needs.        SDOH assessments and interventions completed:  No     Care Coordination Interventions Activated:  Yes  Care Coordination Interventions:  Yes, provided   Follow up plan: No further intervention required.   Encounter Outcome:  Pt. Visit Completed   Raina Mina, RN Care Management Coordinator [place] Office [phone]

## 2021-11-04 NOTE — Patient Instructions (Addendum)
Visit Information  Thank you for taking time to visit with me today. Please don't hesitate to contact me if I can be of assistance to you.   Following are the goals we discussed today:   Goals Addressed               This Visit's Progress     COMPLETED: No needs however open to Pneumonia education and f/u call (pt-stated)        Care Coordination Interventions: Provided education to patient re: Pneumonia and what to avoid to prevent readmissions. Also educated on urgent care visit if not emergent on his symptoms or unable to obtain an appointment with his provider. Will also send educational information via MyChart as agreed.  Reviewed medications with patient and discussed purpose of his medications. Discussed plans with patient for ongoing care management follow up and provided patient with direct contact information for care management team Screening for signs and symptoms of depression related to chronic disease state   9/6 No further follow up needed with previous symptoms resolved for his pneumonia. Pt provided contact to reach RN case manager with any additional needs.Assessed social determinant of health barriers          Please call the care guide team at [phone] if you need to cancel or reschedule your appointment.   If you are experiencing a Mental Health or [place] or need someone to talk to, please call the Suicide and Crisis Lifeline: [phone]  Patient verbalizes understanding of instructions and care plan provided today and agrees to view in [place]. Active MyChart status and patient understanding of how to access instructions and care plan via MyChart confirmed with patient.     No further follow up required: No further needs   Raina Mina, RN Care Management Coordinator [place] Office [phone]

## 2021-11-06 ENCOUNTER — Ambulatory Visit (INDEPENDENT_AMBULATORY_CARE_PROVIDER_SITE_OTHER)
Admission: RE | Admit: 2021-11-06 | Discharge: 2021-11-06 | Disposition: A | Discharge status: Home / Self Care | Payer: Medicare Other | Source: Ambulatory Visit | Attending: Family Medicine | Admitting: Family Medicine

## 2021-11-06 DIAGNOSIS — J189 Pneumonia, unspecified organism: Secondary | ICD-10-CM

## 2021-11-09 NOTE — Progress Notes (Signed)
Please call patient: I have reviewed his/her xray results. Please let him know that his chest xray shows that his lung infection is almost completely resolved. Nothing further is needed.

## 2021-11-11 ENCOUNTER — Encounter: Payer: Self-pay | Admitting: Family Medicine

## 2021-11-11 ENCOUNTER — Ambulatory Visit (INDEPENDENT_AMBULATORY_CARE_PROVIDER_SITE_OTHER): Payer: Medicare Other | Admitting: Family Medicine

## 2021-11-11 VITALS — BP 114/62 | HR 88 | Temp 98.0°F | Ht 66.0 in | Wt 201.4 lb

## 2021-11-11 DIAGNOSIS — J189 Pneumonia, unspecified organism: Secondary | ICD-10-CM | POA: Diagnosis not present

## 2021-11-11 DIAGNOSIS — Z794 Long term (current) use of insulin: Secondary | ICD-10-CM

## 2021-11-11 DIAGNOSIS — E1121 Type 2 diabetes mellitus with diabetic nephropathy: Secondary | ICD-10-CM | POA: Diagnosis not present

## 2021-11-11 DIAGNOSIS — Z23 Encounter for immunization: Secondary | ICD-10-CM

## 2021-11-11 DIAGNOSIS — I5033 Acute on chronic diastolic (congestive) heart failure: Secondary | ICD-10-CM | POA: Diagnosis not present

## 2021-11-11 DIAGNOSIS — I482 Chronic atrial fibrillation, unspecified: Secondary | ICD-10-CM | POA: Diagnosis not present

## 2021-11-11 DIAGNOSIS — I10 Essential (primary) hypertension: Secondary | ICD-10-CM

## 2021-11-11 DIAGNOSIS — I5032 Chronic diastolic (congestive) heart failure: Secondary | ICD-10-CM

## 2021-11-11 LAB — POCT GLYCOSYLATED HEMOGLOBIN (HGB A1C): Hemoglobin A1C: 7.7 % — AB (ref 4.0–5.6)

## 2021-11-11 NOTE — Progress Notes (Signed)
Subjective  CC: DM, f/u pneumonia, CHF, and htn  HPI: [name] is a 88 y.o. male who presents to the office today for follow up of diabetes and problems listed above in the chief complaint.  Diabetes follow up: His diabetic control is reported as Unchanged. Doing well.  He denies exertional CP or SOB or symptomatic hypoglycemia. He denies foot sores or paresthesias.  CAD w/o chest pain Diastolic chf: acute episode now completely resolved. No more leg edema nor needing lastix Mulilobar pneumonia: reviewed f/u chest xray and almost completely resolved 4 weeks out. Feels well. Energy has normalized and no cough or sob or fevers. Strength is good again. Normal appetite.   Wt Readings from Last 3 Encounters:  11/11/21 201 lb 6.4 oz (91.4 kg)  10/22/21 200 lb 6.4 oz (90.9 kg)  10/14/21 198 lb (89.8 kg)    BP Readings from Last 3 Encounters:  11/11/21 114/62  10/22/21 126/68  10/14/21 136/80    Assessment  1. Type 2 diabetes mellitus with diabetic nephropathy, with long-term current use of insulin ([place])   2. Chronic atrial fibrillation (HCC)   3. Essential (primary) hypertension   4. Chronic diastolic CHF (congestive heart failure) ([place])   5. Multifocal pneumonia   6. Acute on chronic diastolic congestive heart failure ([place])   7. Need for immunization against influenza      Plan  Diabetes is currently adequately controlled. Goal a1c < 8 given age and comorbidities. No change in meds HTN: This medical condition is well controlled. There are no signs of complications, medication side effects, or red flags. Patient is instructed to continue the current treatment plan without change in therapies or medications.  Afib is rate controlled and anticoagulated Chf: acute chf resolved. Stable.  Pneumonia: recovered w/ neg f/u chest xray.   Follow up: Return in about 4 months (around 03/13/2022) for complete physical.. Orders Placed This Encounter  Procedures   Flu Vaccine QUAD High  Dose(Fluad)   POCT HgB A1C   No orders of the defined types were placed in this encounter.     Immunization History  Administered Date(s) Administered   Fluad Quad(high Dose 65+) 11/02/2018, 11/30/2019, 11/07/2020, 11/11/2021   Influenza Split 11/14/2007, 01/01/2009, 12/23/2009, 11/29/2012, 10/31/2015   Influenza, High Dose Seasonal PF 11/02/2012, 12/12/2013, 11/15/2014, 11/24/2017, 01/17/2018   Influenza, Seasonal, Injecte, Preservative Fre 11/18/2015   Influenza,inj,Quad PF,6+ Mos 02/15/2017   Influenza,trivalent, recombinat, inj, PF 12/07/2010   Influenza-Unspecified 10/28/2011, 11/30/2014, 02/15/2017   PFIZER(Purple Top)SARS-COV-2 Vaccination 03/23/2019, 04/14/2019, 12/06/2019, 06/21/2020   Pfizer Covid-19 Vaccine Bivalent Booster 53yr & up 01/02/2021   Pneumococcal Conjugate-13 12/13/2013   Pneumococcal Polysaccharide-23 08/20/2008   Td 05/29/2002   Tdap 10/28/2011, 02/15/2017   Zoster Recombinat (Shingrix) 09/16/2016, 10/13/2016, 12/13/2016, 01/11/2017   Zoster, Live 08/20/2008    Diabetes Related Lab Review: Lab Results  Component Value Date   HGBA1C 7.7 (A) 11/11/2021   HGBA1C 7.6 (A) 05/11/2021   HGBA1C 7.4 (A) 02/09/2021    Lab Results  Component Value Date   MICROALBUR <0.7 03/14/2019   Lab Results  Component Value Date   CREATININE 1.25 10/30/2021   BUN 17 10/30/2021   NA 141 10/30/2021   K 4.7 10/30/2021   CL 103 10/30/2021   CO2 27 10/30/2021   Lab Results  Component Value Date   CHOL 154 04/14/2021   CHOL 154 08/04/2020   CHOL 147 08/13/2019   Lab Results  Component Value Date   HDL 54.70 04/14/2021   HDL 46.10  08/04/2020   HDL 49.70 08/13/2019   Lab Results  Component Value Date   LDLCALC 75 04/14/2021   LDLCALC 90 08/04/2020   LDLCALC 79 08/13/2019   Lab Results  Component Value Date   TRIG 119.0 04/14/2021   TRIG 86.0 08/04/2020   TRIG 93.0 08/13/2019   Lab Results  Component Value Date   CHOLHDL 3 04/14/2021   CHOLHDL 3  08/04/2020   CHOLHDL 3 08/13/2019   No results found for: "LDLDIRECT" The ASCVD Risk score (Arnett DK, et al., 2019) failed to calculate for the following reasons:   The 2019 ASCVD risk score is only valid for ages 18 to 75   The patient has a prior MI or stroke diagnosis I have reviewed the PMH, Fam and Soc history. Patient Active Problem List   Diagnosis Date Noted   Mild cognitive impairment 03/05/2021    Priority: High    MMSE 26/30 and 6CIT 9 03/2021 AWV    Moderate nonproliferative diabetic retinopathy of left eye ([place]) 06/18/2020    Priority: High   Stage 3b chronic kidney disease ([place]) 05/26/2020    Priority: High   Aortic stenosis, moderate 05/22/2019    Priority: High    Stable echocardiogram 05/2020, Dr. Martin[place]    Chronic diastolic CHF (congestive heart failure) ([place]) 03/14/2019    Priority: High   Spinal stenosis of lumbar region 02/21/2018    Priority: High   Diabetic peripheral neuropathy associated with type 2 diabetes mellitus ([place]) 05/05/2017    Priority: High    No pain    Atherosclerotic heart disease of native coronary artery without angina pectoris 06/13/2016    Priority: High    Overview:  Overview:  RCA DES placed Nov 2016, LIMA-LAD presumed patent based on Myoview but no visualized, SVG-Dx occluded  Last Assessment & Plan:  RCA DES placed Nov 2016, LIMA-LAD presumed patent based on Myoview but no visualized, SVG-Dx occluded    CVA (cerebral vascular accident) ([place]) 06/06/2016    Priority: High   Recurrent coronary arteriosclerosis after percutaneous transluminal coronary angioplasty 04/18/2015    Priority: High    Overview:  Overview:  RCA DES placed Nov 2016, LIMA-LAD presumed patent based on Myoview but no visualized, SVG-Dx occluded  Last Assessment & Plan:  RCA DES placed Nov 2016, LIMA-LAD presumed patent based on Myoview but no visualized, SVG-Dx occluded    CAD S/P PCI- Nov 2016     Priority: High    RCA DES placed Nov 2016, LIMA-LAD  presumed patent based on Myoview but no visualized, SVG-Dx occluded    Current use of long term anticoagulation 02/06/2013    Priority: High    Overview:  Monitored by cardiology    Chronic atrial fibrillation ([place]) 07/30/2011    Priority: High    CHADs VASc=5 for age, HTN, vascular disease, and DM    Hx of CABG x 2 1990     Priority: High    Status post CABG x2 in 1990 including an LIMA graft to the LAD, and a vein graft to the diagonal.     Essential (primary) hypertension     Priority: High   Diabetes mellitus with diabetic nephropathy, with long-term current use of insulin (HCC)     Priority: High    Neg urine microalbuminuria, not on ace.  On farxiga    Mixed hyperlipidemia     Priority: High   Thoracic aortic aneurysm without rupture ([place]) 05/26/2020    Priority: Medium     Followed  by cards. Stable by CT 2022    Degeneration of lumbar intervertebral disc 02/21/2018    Priority: Medium    Chronic vertigo 03/15/2014    Priority: Medium    Obesity (BMI 30.0-34.9) 01/31/2012    Priority: Medium    Enlarged prostate without lower urinary tract symptoms (luts) 01/04/2011    Priority: Medium     Overview:  Urology - avodart and tamuloscin    Gastro-esophageal reflux disease without esophagitis 12/07/2010    Priority: Medium    Epistaxis, recurrent 04/11/2015    Priority: Low   Osteoarthritis, knee 01/31/2012    Priority: Low   Allergic rhinitis 10/28/2011    Priority: Low   Hearing loss 06/08/2011    Priority: Low   Primary osteoarthritis of hand 06/08/2011    Priority: Low   Benign hematuria 08/27/2008    Priority: Low    Essential Hematuria - urology - Risa Grill w/u benign     Stable hemispheric central retinal vein occlusion (CRVO) of right eye 07/21/2020   Lower extremity edema 03/14/2019    Social History: Patient  reports that he quit smoking about 63 years ago. His smoking use included cigarettes. He has a 30.00 pack-year smoking history. He has never  used smokeless tobacco. He reports that he does not drink alcohol and does not use drugs.  Review of Systems: Ophthalmic: negative for eye pain, loss of vision or double vision Cardiovascular: negative for chest pain Respiratory: negative for SOB or persistent cough Gastrointestinal: negative for abdominal pain Genitourinary: negative for dysuria or gross hematuria MSK: negative for foot lesions Neurologic: negative for weakness or gait disturbance  Objective  Vitals: BP 114/62   Pulse 88   Temp 98 F (36.7 C)   Ht '5\' 6"'$  (1.676 m)   Wt 201 lb 6.4 oz (91.4 kg)   SpO2 95%   BMI 32.51 kg/m  General: well appearing, no acute distress , looks good Psych:  Alert and oriented, normal mood and affect Cardiovascular:  Nl S1 and S2, RRR witt murmur Respiratory:  Good breath sounds bilaterally, CTAB with normal effort, no rales Leg: no edema  Diabetic education: ongoing education regarding chronic disease management for diabetes was given today. We continue to reinforce the ABC's of diabetic management: A1c (<7 or 8 dependent upon patient), tight blood pressure control, and cholesterol management with goal LDL < 100 minimally. We discuss diet strategies, exercise recommendations, medication options and possible side effects. At each visit, we review recommended immunizations and preventive care recommendations for diabetics and stress that good diabetic control can prevent other problems. See below for this patient's data.   Commons side effects, risks, benefits, and alternatives for medications and treatment plan prescribed today were discussed, and the patient expressed understanding of the given instructions. Patient is instructed to call or message via MyChart if he/she has any questions or concerns regarding our treatment plan. No barriers to understanding were identified. We discussed Red Flag symptoms and signs in detail. Patient expressed understanding regarding what to do in case of  urgent or emergency type symptoms.  Medication list was reconciled, printed and provided to the patient in AVS. Patient instructions and summary information was reviewed with the patient as documented in the AVS. This note was prepared with assistance of Dragon voice recognition software. Occasional wrong-word or sound-a-like substitutions may have occurred due to the inherent limitations of voice recognition software

## 2021-11-17 ENCOUNTER — Telehealth: Payer: Self-pay | Admitting: Family Medicine

## 2021-11-17 NOTE — Telephone Encounter (Signed)
  Is this a Certification or Plan of Care? YES  HH Agency: [facility] Number:   [id number]  Has charge sheet been attached? YES  Where has form been placed:   PR[facility]  Faxed to:

## 2021-11-19 ENCOUNTER — Other Ambulatory Visit: Payer: Self-pay | Admitting: Family Medicine

## 2021-11-19 NOTE — Telephone Encounter (Signed)
Form placed in [name]'s bo and will be faxed once signed.

## 2021-11-20 ENCOUNTER — Ambulatory Visit: Payer: Medicare Other | Admitting: Podiatry

## 2021-11-23 ENCOUNTER — Telehealth: Payer: Self-pay | Admitting: Family Medicine

## 2021-11-23 NOTE — Telephone Encounter (Signed)
FYI  Caller is [name] from [facility] states: -Patient informed her that he no longer wants their services  - Patient was able to finish/ be discharged from OT the week of 08/30 -Patient is currently high functioning and no longer needs PT

## 2021-11-27 ENCOUNTER — Encounter: Payer: Self-pay | Admitting: Podiatry

## 2021-11-27 ENCOUNTER — Ambulatory Visit (INDEPENDENT_AMBULATORY_CARE_PROVIDER_SITE_OTHER): Payer: Medicare Other | Admitting: Podiatry

## 2021-11-27 DIAGNOSIS — M79674 Pain in right toe(s): Secondary | ICD-10-CM

## 2021-11-27 DIAGNOSIS — E1142 Type 2 diabetes mellitus with diabetic polyneuropathy: Secondary | ICD-10-CM

## 2021-11-27 DIAGNOSIS — D689 Coagulation defect, unspecified: Secondary | ICD-10-CM | POA: Diagnosis not present

## 2021-11-27 DIAGNOSIS — B351 Tinea unguium: Secondary | ICD-10-CM

## 2021-11-27 NOTE — Progress Notes (Signed)
This patient returns to my office for at risk foot care.  This patient requires this care by a professional since this patient will be at risk due to having diabetic neuropathy and coagulation defect.  Patient is taking eliquiss.  Patient says toenails are painful walking and wearing his shoes.  This patient presents for at risk foot care today.  General Appearance  Alert, conversant and in no acute stress.  Vascular  Dorsalis pedis and posterior tibial  pulses are weakly  palpable  bilaterally.  Capillary return is within normal limits  bilaterally. Temperature is within normal limits  bilaterally.  Neurologic  Senn-Weinstein monofilament wire test within normal limits  bilaterally. Muscle power within normal limits bilaterally.  Nails Thick disfigured discolored nails with subungual debris  from hallux to fifth toes bilaterally. No evidence of bacterial infection or drainage bilaterally.  Orthopedic  No limitations of motion  feet .  No crepitus or effusions noted.  No bony pathology or digital deformities noted.  Skin  normotropic skin with no porokeratosis noted bilaterally.  No signs of infections or ulcers noted.     Onychomycosis  B/L  Consent was obtained for treatment procedures.   Debridement of nails with nail nipper followed by dremel tool. Filed with dremel without incident.    Return office visit    12   weeks                Told patient to return for periodic foot care and evaluation due to potential at risk complications.   Ulas Zuercher DPM  

## 2021-12-04 DIAGNOSIS — I4891 Unspecified atrial fibrillation: Secondary | ICD-10-CM | POA: Diagnosis not present

## 2021-12-04 DIAGNOSIS — R062 Wheezing: Secondary | ICD-10-CM | POA: Diagnosis not present

## 2021-12-05 ENCOUNTER — Encounter (HOSPITAL_BASED_OUTPATIENT_CLINIC_OR_DEPARTMENT_OTHER): Payer: Self-pay | Admitting: Emergency Medicine

## 2021-12-05 ENCOUNTER — Emergency Department (HOSPITAL_BASED_OUTPATIENT_CLINIC_OR_DEPARTMENT_OTHER)
Admission: EM | Admit: 2021-12-05 | Discharge: 2021-12-05 | Disposition: A | Discharge status: Home / Self Care | Payer: Medicare Other | Attending: Emergency Medicine | Admitting: Emergency Medicine

## 2021-12-05 ENCOUNTER — Emergency Department (HOSPITAL_BASED_OUTPATIENT_CLINIC_OR_DEPARTMENT_OTHER): Payer: Medicare Other | Admitting: Radiology

## 2021-12-05 ENCOUNTER — Other Ambulatory Visit: Payer: Self-pay

## 2021-12-05 DIAGNOSIS — J9801 Acute bronchospasm: Secondary | ICD-10-CM | POA: Diagnosis not present

## 2021-12-05 DIAGNOSIS — Z7901 Long term (current) use of anticoagulants: Secondary | ICD-10-CM | POA: Insufficient documentation

## 2021-12-05 DIAGNOSIS — Z794 Long term (current) use of insulin: Secondary | ICD-10-CM | POA: Diagnosis not present

## 2021-12-05 DIAGNOSIS — Z7984 Long term (current) use of oral hypoglycemic drugs: Secondary | ICD-10-CM | POA: Insufficient documentation

## 2021-12-05 DIAGNOSIS — Z79899 Other long term (current) drug therapy: Secondary | ICD-10-CM | POA: Insufficient documentation

## 2021-12-05 DIAGNOSIS — R0602 Shortness of breath: Secondary | ICD-10-CM | POA: Insufficient documentation

## 2021-12-05 DIAGNOSIS — R062 Wheezing: Secondary | ICD-10-CM | POA: Diagnosis not present

## 2021-12-05 LAB — COMPREHENSIVE METABOLIC PANEL
ALT: 9 U/L (ref 0–44)
AST: 14 U/L — ABNORMAL LOW (ref 15–41)
Albumin: 4.5 g/dL (ref 3.5–5.0)
Alkaline Phosphatase: 65 U/L (ref 38–126)
Anion gap: 12 (ref 5–15)
BUN: 20 mg/dL (ref 8–23)
CO2: 28 mmol/L (ref 22–32)
Calcium: 9.8 mg/dL (ref 8.9–10.3)
Chloride: 101 mmol/L (ref 98–111)
Creatinine, Ser: 1.21 mg/dL (ref 0.61–1.24)
GFR, Estimated: 57 mL/min — ABNORMAL LOW (ref >60)
Glucose, Bld: 117 mg/dL — ABNORMAL HIGH (ref 70–99)
Potassium: 4.1 mmol/L (ref 3.5–5.1)
Sodium: 141 mmol/L (ref 135–145)
Total Bilirubin: 0.8 mg/dL (ref 0.3–1.2)
Total Protein: 7.8 g/dL (ref 6.5–8.1)

## 2021-12-05 LAB — CBC WITH DIFFERENTIAL/PLATELET
Abs Immature Granulocytes: 0.03 10*3/uL (ref 0.00–0.07)
Basophils Absolute: 0.2 10*3/uL — ABNORMAL HIGH (ref 0.0–0.1)
Basophils Relative: 2 %
Eosinophils Absolute: 0.6 10*3/uL — ABNORMAL HIGH (ref 0.0–0.5)
Eosinophils Relative: 7 %
HCT: 36 % — ABNORMAL LOW (ref 39.0–52.0)
Hemoglobin: 11 g/dL — ABNORMAL LOW (ref 13.0–17.0)
Immature Granulocytes: 0 %
Lymphocytes Relative: 22 %
Lymphs Abs: 1.9 10*3/uL (ref 0.7–4.0)
MCH: 24 pg — ABNORMAL LOW (ref 26.0–34.0)
MCHC: 30.6 g/dL (ref 30.0–36.0)
MCV: 78.4 fL — ABNORMAL LOW (ref 80.0–100.0)
Monocytes Absolute: 1.1 10*3/uL — ABNORMAL HIGH (ref 0.1–1.0)
Monocytes Relative: 13 %
Neutro Abs: 4.7 10*3/uL (ref 1.7–7.7)
Neutrophils Relative %: 56 %
Platelets: 254 10*3/uL (ref 150–400)
RBC: 4.59 MIL/uL (ref 4.22–5.81)
RDW: 17 % — ABNORMAL HIGH (ref 11.5–15.5)
WBC: 8.5 10*3/uL (ref 4.0–10.5)
nRBC: 0 % (ref 0.0–0.2)

## 2021-12-05 LAB — TROPONIN I (HIGH SENSITIVITY): Troponin I (High Sensitivity): 9 ng/L (ref <18)

## 2021-12-05 LAB — BRAIN NATRIURETIC PEPTIDE: B Natriuretic Peptide: 294 pg/mL — ABNORMAL HIGH (ref 0.0–100.0)

## 2021-12-05 MED ORDER — ALBUTEROL SULFATE HFA 108 (90 BASE) MCG/ACT IN AERS
2.0000 | INHALATION_SPRAY | RESPIRATORY_TRACT | Status: DC | PRN
  Administered 2021-12-05: 2 via RESPIRATORY_TRACT
  Filled 2021-12-05: qty 6.7

## 2021-12-05 MED ORDER — AEROCHAMBER PLUS FLO-VU MEDIUM MISC
1.0000 | Freq: Once | Status: AC
  Administered 2021-12-05: 1
  Filled 2021-12-05: qty 1

## 2021-12-05 NOTE — ED Triage Notes (Signed)
Pt is on eliquis for afib.

## 2021-12-05 NOTE — ED Triage Notes (Signed)
Tuesday pt woke up with stuffy nose, cough and saw MD on Wednesday at the [facility], his son stated he started wheezing on Thursday, yesterday he had hard time breathing, called EMS, [facility], they left, Last night pt had orthopnea, couldn't sleep due to felt like he couldn't breathe. About a month pt had sepsis and pneumonia , treated.

## 2021-12-05 NOTE — ED Provider Notes (Signed)
[facility] Provider Note   CSN: [id number] Arrival date & time: 12/05/21  0102     History  No chief complaint on file.   [name] is a 88 y.o. male.  The history is provided by the patient and medical records. No language interpreter was used.  Wheezing Severity:  Moderate Severity compared to prior episodes:  Unable to specify Onset quality:  Gradual Duration:  4 days Progression:  Waxing and waning Chronicity:  New Relieved by:  Nothing Worsened by:  Nothing Ineffective treatments:  None tried Associated symptoms: cough and shortness of breath   Associated symptoms: no chest pain, no chest tightness, no fatigue, no fever, no headaches, no rash, no sputum production, no stridor and no swollen glands   Risk factors: prior hospitalizations        Home Medications Prior to Admission medications   Medication Sig Start Date End Date Taking? Authorizing Provider  apixaban (ELIQUIS) 5 MG TABS tablet Take 1 tablet (5 mg total) by mouth 2 (two) times daily. 06/08/16  Yes Reyne Dumas, MD  atorvastatin (LIPITOR) 40 MG tablet Take 1 tablet (40 mg total) by mouth daily at 6 PM. 06/08/16   Reyne Dumas, MD  AVODART 0.5 MG capsule Take 0.5 mg by mouth See admin instructions. Take 0.5 mg by mouth every other night- at bedtime 07/20/11   [provider]  Cholecalciferol (VITAMIN D3 PO) Take 1 capsule by mouth daily.    [provider]  diltiazem (TIAZAC) 360 MG 24 hr capsule Take 360 mg by mouth daily. 11/08/19   [provider]  empagliflozin (JARDIANCE) 25 MG TABS tablet Take 1 tablet (25 mg total) by mouth daily before breakfast. 09/03/20   Martinique, Peter M, MD  furosemide (LASIX) 40 MG tablet Take 1 tablet (40 mg total) by mouth daily. 10/23/21   [place], Peter M, MD  gabapentin (NEURONTIN) 300 MG capsule Take 1 capsule (300 mg total) by mouth 3 (three) times daily. 11/19/21   [name], MD  insulin glargine (LANTUS) 100  UNIT/ML injection Inject 40 Units into the skin every morning.    [provider]  metFORMIN (GLUCOPHAGE) 500 MG tablet Take 500 mg by mouth 2 (two) times daily with a meal.    [provider]  metoprolol succinate (TOPROL-XL) 100 MG 24 hr tablet Take 1 tablet (100 mg total) by mouth daily. Take with or immediately following a meal. Patient taking differently: Take 100 mg by mouth in the morning. 09/09/21   [place], Peter M, MD  Multiple Vitamin (MULTIVITAMIN) tablet Take 1 tablet by mouth daily with breakfast.    [provider]  nitroGLYCERIN (NITROSTAT) 0.4 MG SL tablet Place 0.4 mg under the tongue every 5 (five) minutes as needed for chest pain (x 3 doses).    [provider]  pantoprazole (PROTONIX) 40 MG tablet TAKE 1 TABLET BY MOUTH DAILY AT NOON Patient taking differently: Take 40 mg by mouth daily in the afternoon. 05/25/21   Martin[place], Peter M, MD  tamsulosin (FLOMAX) 0.4 MG CAPS capsule Take 0.4 mg by mouth every other day. 03/19/21   [provider]  TYLENOL 500 MG tablet Take 500-1,000 mg by mouth every 6 (six) hours as needed for mild pain or headache.    [provider]      Allergies    Septra [sulfamethoxazole-trimethoprim], Cefadroxil, Ciprofloxacin, and Erythromycin    Review of Systems   Review of Systems  Constitutional:  Negative for chills, fatigue  and fever.  HENT:  Negative for congestion.   Eyes:  Negative for visual disturbance.  Respiratory:  Positive for cough, shortness of breath and wheezing. Negative for sputum production, choking, chest tightness and stridor.   Cardiovascular:  Negative for chest pain.  Gastrointestinal:  Negative for abdominal pain.  Genitourinary:  Negative for dysuria and flank pain.  Musculoskeletal:  Negative for back pain.  Skin:  Negative for rash.  Neurological:  Negative for dizziness, light-headedness and headaches.  Psychiatric/Behavioral:  Negative for agitation.   All other  systems reviewed and are negative.   Physical Exam Updated Vital Signs BP 130/71   Pulse 82   Temp 98.5 F (36.9 C) (Oral)   Resp 18   SpO2 98%  Physical Exam Vitals and nursing note reviewed.  Constitutional:      General: He is not in acute distress.    Appearance: He is well-developed. He is not ill-appearing, toxic-appearing or diaphoretic.  HENT:     Head: Normocephalic and atraumatic.     Nose: No congestion or rhinorrhea.     Mouth/Throat:     Mouth: Mucous membranes are moist.     Pharynx: No posterior oropharyngeal erythema.  Eyes:     Extraocular Movements: Extraocular movements intact.     Conjunctiva/sclera: Conjunctivae normal.     Pupils: Pupils are equal, round, and reactive to light.  Cardiovascular:     Rate and Rhythm: Normal rate and regular rhythm.     Heart sounds: No murmur heard. Pulmonary:     Effort: No respiratory distress.     Breath sounds: Wheezing and rhonchi present. No rales.  Chest:     Chest wall: No tenderness.  Abdominal:     Palpations: Abdomen is soft.     Tenderness: There is no abdominal tenderness. There is no guarding or rebound.  Musculoskeletal:        General: No swelling or tenderness.     Cervical back: Neck supple. No tenderness.     Right lower leg: No edema.     Left lower leg: No edema.  Skin:    General: Skin is warm and dry.     Capillary Refill: Capillary refill takes less than 2 seconds.     Findings: No erythema or rash.  Neurological:     Mental Status: He is alert.  Psychiatric:        Mood and Affect: Mood normal.     ED Results / Procedures / Treatments   Labs (all labs ordered are listed, but only abnormal results are displayed) Labs Reviewed  CBC WITH DIFFERENTIAL/PLATELET - Abnormal; Notable for the following components:      Result Value   Hemoglobin 11.0 (*)    HCT 36.0 (*)    MCV 78.4 (*)    MCH 24.0 (*)    RDW 17.0 (*)    Monocytes Absolute 1.1 (*)    Eosinophils Absolute 0.6 (*)     Basophils Absolute 0.2 (*)    All other components within normal limits  COMPREHENSIVE METABOLIC PANEL - Abnormal; Notable for the following components:   Glucose, Bld 117 (*)    AST 14 (*)    GFR, Estimated 57 (*)    All other components within normal limits  BRAIN NATRIURETIC PEPTIDE - Abnormal; Notable for the following components:   B Natriuretic Peptide 294.0 (*)    All other components within normal limits  TROPONIN I (HIGH SENSITIVITY)  TROPONIN I (HIGH SENSITIVITY)    EKG  EKG Interpretation  Date/Time:  Saturday December 05 2021 09:25:43 EDT Ventricular Rate:  95 PR Interval:    QRS Duration: 84 QT Interval:  370 QTC Calculation: 464 R Axis:   58 Text Interpretation: Atrial fibrillation ST & T wave abnormality, consider inferior ischemia Abnormal ECG No previous ECGs available when compared to prior, similar afib. No STEMI Confirmed by Antony Blackbird [phone]) on 12/05/2021 12:32:33 PM  Radiology DG Chest 2 View  Result Date: 12/05/2021 CLINICAL DATA:  Short of breath. EXAM: CHEST - 2 VIEW COMPARISON:  Chest radiograph 11/06/2021 and 10/10/2021 FINDINGS: Small persistent opacities in the right perihilar regions are similar to the previous examination and could represent the sequelae of previous infection. Left lung is essentially clear. Heart size is stable and prior median sternotomy. Atherosclerotic calcifications at the aortic arch. No large pleural effusions. No acute bone abnormality. IMPRESSION: 1. No acute cardiopulmonary disease. 2. Persistent small patchy densities in the right lung are likely related to recent infection. Electronically Signed   By: Markus Daft M.D.   On: 12/05/2021 10:14    Procedures Procedures    Medications Ordered in ED Medications  albuterol (VENTOLIN HFA) 108 (90 Base) MCG/ACT inhaler 2 puff (2 puffs Inhalation Given 12/05/21 1332)  AeroChamber Plus Flo-Vu Medium MISC 1 each (1 each Other Given 12/05/21 1332)    ED Course/ Medical Decision  Making/ A&P                           Medical Decision Making Amount and/or Complexity of Data Reviewed Labs: ordered. Radiology: ordered.  Risk Prescription drug management.    Daquan Crapps is a 88 y.o. male with a past medical history significant for CAD with CABG, documented CHF, hypertension, hyperlipidemia, chronic atrial fibrillation on Eliquis therapy, previous stroke, aortic stenosis, diabetes, and recent pneumonia/sepsis who presents with wheezing and shortness of breath.  According to patient and family, for the last week he has had some URI symptoms and started wheezing 4 days ago.  He does not a history of COPD or asthma and reports he is never had breathing difficulties like this.  He does have a document history of CHF although he does not report any history to his knowledge of heart failure.  He says that he has not had significant cough but just more difficulty breathing and wheezing.  He denies any trauma.  Denies any fevers or chills.  He does report some congestion but that has slightly improved after starting on guaifenesin this week.  Denies any constipation, diarrhea, urinary changes, nausea, or vomiting.  Denies palpitations.  On exam, lungs have significant wheezing in all fields.  Will give breathing treatment.  There was a systolic murmur and patient does have some faint rhonchi on the right side compared to left.  Abdomen nontender.  Good pulses in extremities.  Legs nontender and nonedematous.  X-ray was obtained in triage that does not show any acute abnormality but does show some of the right-sided opacities consistent with his recent pneumonia.  EKG shows known atrial fibrillation without RVR.  Heart rate is in the 60s and 70s initially.  No STEMI.  Clinically I am concerned about this patient having new wheezing which she reports has never had before.  We discussed it could be related to his recent infection as well as on concomitant URI however we will yet  labs to look for new heart failure exacerbation or fluid overload.  Given his lack of  chest pain have low suspicion for pulmonary embolism and his ox saturations are remaining in the 90s on room air.  Anticipate shared decision made conversation about work-up after labs and reassessment are completed.  Oxygen saturations were in the low 90s and even dipped to 88% but patient did not want to be admitted.  His work-up otherwise showed a BNP in the 200s which she has had in the past although slightly higher than it was last time.  His CBC and metabolic panel similar to prior as we discussed with him initial troponin is negative.  Chest x-ray did not show pneumonia and he felt much better after some albuterol.  Clinically I suspect he does have some mild reactive airway disease causing this wheezing although he did not want to be admitted for further management.  Given his age and diabetes, will hold on a burst of steroids initially as he is doing so much better with just albuterol.  We will give him the albuterol and he will follow-up with his PCP.  Again, we offered him admission for the hypoxia and new shortness of breath and wheezing but he did not want to be admitted.  He will follow-up with PCP and understood extremities return precautions.  Patient discharged in stable condition.         Final Clinical Impression(s) / ED Diagnoses Final diagnoses:  Wheezing  Shortness of breath    Clinical Impression: 1. Wheezing   2. Shortness of breath     Disposition: Discharge  Condition: Good  I have discussed the results, Dx and Tx plan with the pt(& family if present). He/she/they expressed understanding and agree(s) with the plan. Discharge instructions discussed at great length. Strict return precautions discussed and pt &/or family have verbalized understanding of the instructions. No further questions at time of discharge.    New Prescriptions   No medications on file    Follow  Up: [name], [place] [address] [facility] [place] [id number] [phone]          Shasta Chinn, Gwenyth Allegra, MD 12/05/21 1500

## 2021-12-05 NOTE — Discharge Instructions (Signed)
Your history, exam, work-up today did not show evidence of acute pneumonia and your labs were overall reassuring aside from a slightly more elevated BNP as we discussed.  We offered admission due to the intermittent hypoxia and the wheezing however as you were breathing so much better and wants to go home, we felt that was reasonable given your apparent stability with out further symptoms.  Please call to follow-up with your primary doctor in the neck several days and if any symptoms are to change or worsen acutely, please return to the nearest emergency department as anticipate you would need admission at that time.  I suspect some of your breathing difficulties are left over from the recent pneumonia you had within the last several months.

## 2021-12-07 ENCOUNTER — Encounter: Payer: Self-pay | Admitting: Family Medicine

## 2021-12-07 ENCOUNTER — Ambulatory Visit (INDEPENDENT_AMBULATORY_CARE_PROVIDER_SITE_OTHER): Payer: Medicare Other | Admitting: Family Medicine

## 2021-12-07 VITALS — BP 118/60 | HR 89 | Temp 98.4°F | Ht 66.0 in | Wt 200.4 lb

## 2021-12-07 DIAGNOSIS — Z9861 Coronary angioplasty status: Secondary | ICD-10-CM

## 2021-12-07 DIAGNOSIS — I2510 Atherosclerotic heart disease of native coronary artery without angina pectoris: Secondary | ICD-10-CM

## 2021-12-07 DIAGNOSIS — I482 Chronic atrial fibrillation, unspecified: Secondary | ICD-10-CM | POA: Diagnosis not present

## 2021-12-07 DIAGNOSIS — J40 Bronchitis, not specified as acute or chronic: Secondary | ICD-10-CM | POA: Diagnosis not present

## 2021-12-07 DIAGNOSIS — Z7901 Long term (current) use of anticoagulants: Secondary | ICD-10-CM

## 2021-12-07 NOTE — Progress Notes (Signed)
Subjective  CC:  Chief Complaint  Patient presents with   Hospitalization Follow-up    Pt was seen in the ED on 12/05/2021 for wheezing. Stated that he feels like it is getting better.    HPI: [name] is a 88 y.o. male who presents to the office today to address the problems listed above in the chief complaint. 88 year old male with diabetes, hypertension, and multiple medical problems here for ER follow-up.  Seen on October 7 for shortness of breath.  I reviewed the ER visit.  I also reviewed his office visit from the [place] from earlier that week.  He reports that he started with a head cold 3 days prior to the ER visit, no major symptoms outside of congestion and mild malaise.  No fevers.  He then developed wheezing which progressed to shortness of breath.  In the emergency room he was having bronchospasm and some lowish pulse ox readings.  He responded well to albuterol treatments in the emergency room.  Chest x-ray showed remnants of a right lower lobe pneumonia from 2 months ago, no masses or new infiltrates.  Lab work was unremarkable.  Since being released, he reports he is stable.  He has been using the albuterol 3 times daily and it is helpful.  No more episodes of significant shortness of breath.  No new symptoms.  He has no pleuritic chest pain.  No fevers no hemoptysis.  However, he has never had significant wheezing in the past.  No history of asthma or COPD.  On exam in the emergency room he did not show signs of volume overload and had marginally elevated BNP.  Assessment  1. Bronchitis with acute wheezing   2. CAD S/P PCI- Nov 2016   3. Chronic atrial fibrillation ([place])   4. Current use of long term anticoagulation      Plan  Bronchospasm associated with upper respiratory illness: Possibly all related to the virus but need to monitor closely.  We will wean off albuterol over the next 1 to 2 weeks.  If not completely resolved, recommend chest CT.  Patient is not blood  thinner and is not hypoxic currently.  Reports he is feeling better.  If not improving will need to follow-up given his residual lung findings in the right lower lobe.  Patient stands agrees with care plan.  Follow up: As needed 03/11/2022  No orders of the defined types were placed in this encounter.  No orders of the defined types were placed in this encounter.     I reviewed the patients updated PMH, FH, and SocHx.    Patient Active Problem List   Diagnosis Date Noted   Mild cognitive impairment 03/05/2021    Priority: High   Moderate nonproliferative diabetic retinopathy of left eye ([place]) 06/18/2020    Priority: High   Stage 3b chronic kidney disease ([place]) 05/26/2020    Priority: High   Aortic stenosis, moderate 05/22/2019    Priority: High   Chronic diastolic CHF (congestive heart failure) ([place]) 03/14/2019    Priority: High   Spinal stenosis of lumbar region 02/21/2018    Priority: High   Diabetic peripheral neuropathy associated with type 2 diabetes mellitus ([place]) 05/05/2017    Priority: High   Atherosclerotic heart disease of native coronary artery without angina pectoris 06/13/2016    Priority: High   CVA (cerebral vascular accident) ([place]) 06/06/2016    Priority: High   Recurrent coronary arteriosclerosis after percutaneous transluminal coronary angioplasty 04/18/2015  Priority: High   CAD S/P PCI- Nov 2016     Priority: High   Current use of long term anticoagulation 02/06/2013    Priority: High   Chronic atrial fibrillation ([place]) 07/30/2011    Priority: High   Hx of CABG x 2 1990     Priority: High   Essential (primary) hypertension     Priority: High   Diabetes mellitus with diabetic nephropathy, with long-term current use of insulin ([facility])     Priority: High   Mixed hyperlipidemia     Priority: High   Thoracic aortic aneurysm without rupture ([place]) 05/26/2020    Priority: Medium    Degeneration of lumbar intervertebral disc 02/21/2018    Priority: Medium     Chronic vertigo 03/15/2014    Priority: Medium    Obesity (BMI 30.0-34.9) 01/31/2012    Priority: Medium    Enlarged prostate without lower urinary tract symptoms (luts) 01/04/2011    Priority: Medium    Gastro-esophageal reflux disease without esophagitis 12/07/2010    Priority: Medium    Epistaxis, recurrent 04/11/2015    Priority: Low   Osteoarthritis, knee 01/31/2012    Priority: Low   Allergic rhinitis 10/28/2011    Priority: Low   Hearing loss 06/08/2011    Priority: Low   Primary osteoarthritis of hand 06/08/2011    Priority: Low   Benign hematuria 08/27/2008    Priority: Low   Stable hemispheric central retinal vein occlusion (CRVO) of right eye 07/21/2020   Lower extremity edema 03/14/2019   Current Meds  Medication Sig   apixaban (ELIQUIS) 5 MG TABS tablet Take 1 tablet (5 mg total) by mouth 2 (two) times daily.   atorvastatin (LIPITOR) 40 MG tablet Take 1 tablet (40 mg total) by mouth daily at 6 PM.   AVODART 0.5 MG capsule Take 0.5 mg by mouth See admin instructions. Take 0.5 mg by mouth every other night- at bedtime   Cholecalciferol (VITAMIN D3 PO) Take 1 capsule by mouth daily.   diltiazem (TIAZAC) 360 MG 24 hr capsule Take 360 mg by mouth daily.   empagliflozin (JARDIANCE) 25 MG TABS tablet Take 1 tablet (25 mg total) by mouth daily before breakfast.   furosemide (LASIX) 40 MG tablet Take 1 tablet (40 mg total) by mouth daily.   gabapentin (NEURONTIN) 300 MG capsule Take 1 capsule (300 mg total) by mouth 3 (three) times daily.   guaifenesin (HUMIBID E) 400 MG TABS tablet TAKE ONE TABLET BY MOUTH THREE TIMES A DAY AS NEEDED FOR MUCUS   insulin glargine (LANTUS) 100 UNIT/ML injection Inject 40 Units into the skin every morning.   metFORMIN (GLUCOPHAGE) 500 MG tablet Take 500 mg by mouth 2 (two) times daily with a meal.   metoprolol succinate (TOPROL-XL) 100 MG 24 hr tablet Take 1 tablet (100 mg total) by mouth daily. Take with or immediately following a meal.  (Patient taking differently: Take 100 mg by mouth in the morning.)   Multiple Vitamin (MULTIVITAMIN) tablet Take 1 tablet by mouth daily with breakfast.   nitroGLYCERIN (NITROSTAT) 0.4 MG SL tablet Place 0.4 mg under the tongue every 5 (five) minutes as needed for chest pain (x 3 doses).   pantoprazole (PROTONIX) 40 MG tablet TAKE 1 TABLET BY MOUTH DAILY AT NOON (Patient taking differently: Take 40 mg by mouth daily in the afternoon.)   tamsulosin (FLOMAX) 0.4 MG CAPS capsule Take 0.4 mg by mouth every other day.   TYLENOL 500 MG tablet Take 500-1,000 mg by  mouth every 6 (six) hours as needed for mild pain or headache.    Allergies: Patient is allergic to septra [sulfamethoxazole-trimethoprim], cefadroxil, ciprofloxacin, and erythromycin. Family History: Patient family history includes Brain cancer in his father; Throat cancer in his brother. Social History:  Patient  reports that he quit smoking about 63 years ago. His smoking use included cigarettes. He has a 30.00 pack-year smoking history. He has never used smokeless tobacco. He reports that he does not drink alcohol and does not use drugs.  Review of Systems: Constitutional: Negative for fever malaise or anorexia Cardiovascular: negative for chest pain Respiratory: negative for SOB or persistent cough Gastrointestinal: negative for abdominal pain  Objective  Vitals: BP 118/60   Pulse 89   Temp 98.4 F (36.9 C)   Ht '5\' 6"'$  (1.676 m)   Wt 200 lb 6.4 oz (90.9 kg)   SpO2 96%   BMI 32.35 kg/m  General: no acute distress , A&Ox3, speaking in full sentences, no respiratory distress HEENT: PEERL, conjunctiva normal, neck is supple, no significant congestion Cardiovascular: Irregularly irregular, no murmur Respiratory:  Good breath sounds bilaterally, small expiratory wheeze in right lower lobe, otherwise clear.  Good air movement, no retractions Skin:  Warm, no rashes    Commons side effects, risks, benefits, and alternatives for  medications and treatment plan prescribed today were discussed, and the patient expressed understanding of the given instructions. Patient is instructed to call or message via MyChart if he/she has any questions or concerns regarding our treatment plan. No barriers to understanding were identified. We discussed Red Flag symptoms and signs in detail. Patient expressed understanding regarding what to do in case of urgent or emergency type symptoms.  Medication list was reconciled, printed and provided to the patient in AVS. Patient instructions and summary information was reviewed with the patient as documented in the AVS. This note was prepared with assistance of Dragon voice recognition software. Occasional wrong-word or sound-a-like substitutions may have occurred due to the inherent limitations of voice recognition software  This visit occurred during the SARS-CoV-2 public health emergency.  Safety protocols were in place, including screening questions prior to the visit, additional usage of staff PPE, and extensive cleaning of exam room while observing appropriate contact time as indicated for disinfecting solutions.

## 2021-12-10 ENCOUNTER — Other Ambulatory Visit: Payer: Self-pay | Admitting: Family Medicine

## 2021-12-10 ENCOUNTER — Ambulatory Visit (INDEPENDENT_AMBULATORY_CARE_PROVIDER_SITE_OTHER): Payer: Medicare Other | Admitting: Family Medicine

## 2021-12-10 ENCOUNTER — Encounter: Payer: Self-pay | Admitting: Family Medicine

## 2021-12-10 ENCOUNTER — Telehealth: Payer: Self-pay

## 2021-12-10 VITALS — BP 120/74 | HR 92 | Temp 99.1°F | Ht 66.0 in

## 2021-12-10 DIAGNOSIS — J069 Acute upper respiratory infection, unspecified: Secondary | ICD-10-CM

## 2021-12-10 DIAGNOSIS — J40 Bronchitis, not specified as acute or chronic: Secondary | ICD-10-CM | POA: Diagnosis not present

## 2021-12-10 MED ORDER — CETIRIZINE HCL 10 MG PO TABS: 10.0000 mg | ORAL_TABLET | Freq: Every day | ORAL | 0 refills | Status: DC

## 2021-12-10 MED ORDER — AZELASTINE HCL 0.1 % NA SOLN
2.0000 | Freq: Two times a day (BID) | NASAL | 0 refills | Status: DC
Start: 2021-12-10 — End: 2022-03-26

## 2021-12-10 NOTE — Progress Notes (Signed)
Subjective  CC:  Chief Complaint  Patient presents with   Cough    Pt stated that cough has not gotten ny better    HPI: [name] is a 88 y.o. male who presents to the office today to address the problems listed above in the chief complaint. See last note, patient with viral respiratory infection and associated bronchospasm.  Fortunately, he is no longer having wheezing or shortness of breath.  However he was up all night because he cannot breathe through his nose.  Nasal congestion persists.  Clear rhinorrhea.  He finds this very bothersome.  No more fevers.  No chest pain, no palpitations or shortness of breath.  No lightheadedness.  Appetite is fine.  Assessment  1. Viral URI   2. Bronchitis with acute wheezing      Plan  Viral URI with history of wheezing: Could be RSV infection.  Lots of rhinorrhea.  Education given.  Recommend Zyrtec nightly and Astelin.  He may stop the albuterol and Mucinex.  Time should improve his symptoms.  Reassured  Follow up: As scheduled 03/11/2022  No orders of the defined types were placed in this encounter.  Meds ordered this encounter  Medications   azelastine (ASTELIN) 0.1 % nasal spray    Sig: Place 2 sprays into both nostrils 2 (two) times daily. Use in each nostril as directed    Dispense:  30 mL    Refill:  0   cetirizine (ZYRTEC) 10 MG tablet    Sig: Take 1 tablet (10 mg total) by mouth daily.    Dispense:  30 tablet    Refill:  0      I reviewed the patients updated PMH, FH, and SocHx.    Patient Active Problem List   Diagnosis Date Noted   Mild cognitive impairment 03/05/2021    Priority: High   Moderate nonproliferative diabetic retinopathy of left eye ([place]) 06/18/2020    Priority: High   Stage 3b chronic kidney disease ([place]) 05/26/2020    Priority: High   Aortic stenosis, moderate 05/22/2019    Priority: High   Chronic diastolic CHF (congestive heart failure) ([place]) 03/14/2019    Priority: High   Spinal  stenosis of lumbar region 02/21/2018    Priority: High   Diabetic peripheral neuropathy associated with type 2 diabetes mellitus ([place]) 05/05/2017    Priority: High   Atherosclerotic heart disease of native coronary artery without angina pectoris 06/13/2016    Priority: High   CVA (cerebral vascular accident) ([place]) 06/06/2016    Priority: High   Recurrent coronary arteriosclerosis after percutaneous transluminal coronary angioplasty 04/18/2015    Priority: High   CAD S/P PCI- Nov 2016     Priority: High   Current use of long term anticoagulation 02/06/2013    Priority: High   Chronic atrial fibrillation ([place]) 07/30/2011    Priority: High   Hx of CABG x 2 1990     Priority: High   Essential (primary) hypertension     Priority: High   Diabetes mellitus with diabetic nephropathy, with long-term current use of insulin (HCC)     Priority: High   Mixed hyperlipidemia     Priority: High   Thoracic aortic aneurysm without rupture ([place]) 05/26/2020    Priority: Medium    Degeneration of lumbar intervertebral disc 02/21/2018    Priority: Medium    Chronic vertigo 03/15/2014    Priority: Medium    Obesity (BMI 30.0-34.9) 01/31/2012    Priority: Medium  Enlarged prostate without lower urinary tract symptoms (luts) 01/04/2011    Priority: Medium    Gastro-esophageal reflux disease without esophagitis 12/07/2010    Priority: Medium    Epistaxis, recurrent 04/11/2015    Priority: Low   Osteoarthritis, knee 01/31/2012    Priority: Low   Allergic rhinitis 10/28/2011    Priority: Low   Hearing loss 06/08/2011    Priority: Low   Primary osteoarthritis of hand 06/08/2011    Priority: Low   Benign hematuria 08/27/2008    Priority: Low   Stable hemispheric central retinal vein occlusion (CRVO) of right eye 07/21/2020   Lower extremity edema 03/14/2019   Current Meds  Medication Sig   apixaban (ELIQUIS) 5 MG TABS tablet Take 1 tablet (5 mg total) by mouth 2 (two) times daily.    atorvastatin (LIPITOR) 40 MG tablet Take 1 tablet (40 mg total) by mouth daily at 6 PM.   AVODART 0.5 MG capsule Take 0.5 mg by mouth See admin instructions. Take 0.5 mg by mouth every other night- at bedtime   azelastine (ASTELIN) 0.1 % nasal spray Place 2 sprays into both nostrils 2 (two) times daily. Use in each nostril as directed   cetirizine (ZYRTEC) 10 MG tablet Take 1 tablet (10 mg total) by mouth daily.   Cholecalciferol (VITAMIN D3 PO) Take 1 capsule by mouth daily.   diltiazem (TIAZAC) 360 MG 24 hr capsule Take 360 mg by mouth daily.   empagliflozin (JARDIANCE) 25 MG TABS tablet Take 1 tablet (25 mg total) by mouth daily before breakfast.   furosemide (LASIX) 40 MG tablet Take 1 tablet (40 mg total) by mouth daily.   gabapentin (NEURONTIN) 300 MG capsule Take 1 capsule (300 mg total) by mouth 3 (three) times daily.   guaifenesin (HUMIBID E) 400 MG TABS tablet TAKE ONE TABLET BY MOUTH THREE TIMES A DAY AS NEEDED FOR MUCUS   insulin glargine (LANTUS) 100 UNIT/ML injection Inject 40 Units into the skin every morning.   metFORMIN (GLUCOPHAGE) 500 MG tablet Take 500 mg by mouth 2 (two) times daily with a meal.   metoprolol succinate (TOPROL-XL) 100 MG 24 hr tablet Take 1 tablet (100 mg total) by mouth daily. Take with or immediately following a meal. (Patient taking differently: Take 100 mg by mouth in the morning.)   Multiple Vitamin (MULTIVITAMIN) tablet Take 1 tablet by mouth daily with breakfast.   nitroGLYCERIN (NITROSTAT) 0.4 MG SL tablet Place 0.4 mg under the tongue every 5 (five) minutes as needed for chest pain (x 3 doses).   pantoprazole (PROTONIX) 40 MG tablet TAKE 1 TABLET BY MOUTH DAILY AT NOON (Patient taking differently: Take 40 mg by mouth daily in the afternoon.)   tamsulosin (FLOMAX) 0.4 MG CAPS capsule Take 0.4 mg by mouth every other day.   TYLENOL 500 MG tablet Take 500-1,000 mg by mouth every 6 (six) hours as needed for mild pain or headache.    Allergies: Patient is  allergic to septra [sulfamethoxazole-trimethoprim], cefadroxil, ciprofloxacin, and erythromycin. Family History: Patient family history includes Brain cancer in his father; Throat cancer in his brother. Social History:  Patient  reports that he quit smoking about 63 years ago. His smoking use included cigarettes. He has a 30.00 pack-year smoking history. He has never used smokeless tobacco. He reports that he does not drink alcohol and does not use drugs.  Review of Systems: Constitutional: Negative for fever malaise or anorexia Cardiovascular: negative for chest pain Respiratory: negative for SOB or persistent cough  Gastrointestinal: negative for abdominal pain  Objective  Vitals: BP 120/74   Pulse 92   Temp 99.1 F (37.3 C)   Ht '5\' 6"'$  (1.676 m)   SpO2 95%   BMI 32.35 kg/m  General: no acute distress , A&Ox3, well-appearing HEENT: PEERL, conjunctiva normal, neck is supple Cardiovascular:  RRR without murmur or gallop.  Respiratory:  Good breath sounds bilaterally, CTAB with normal respiratory effort Skin:  Warm, no rashes    Commons side effects, risks, benefits, and alternatives for medications and treatment plan prescribed today were discussed, and the patient expressed understanding of the given instructions. Patient is instructed to call or message via MyChart if he/she has any questions or concerns regarding our treatment plan. No barriers to understanding were identified. We discussed Red Flag symptoms and signs in detail. Patient expressed understanding regarding what to do in case of urgent or emergency type symptoms.  Medication list was reconciled, printed and provided to the patient in AVS. Patient instructions and summary information was reviewed with the patient as documented in the AVS. This note was prepared with assistance of Dragon voice recognition software. Occasional wrong-word or sound-a-like substitutions may have occurred due to the inherent limitations of voice  recognition software  This visit occurred during the SARS-CoV-2 public health emergency.  Safety protocols were in place, including screening questions prior to the visit, additional usage of staff PPE, and extensive cleaning of exam room while observing appropriate contact time as indicated for disinfecting solutions.

## 2021-12-10 NOTE — Telephone Encounter (Signed)
     Patient  visit on 10/7  at [facility] you been able to follow up with your primary care physician? yes  The patient was or was not able to obtain any needed medicine or equipment. yes  Are there diet recommendations that you are having difficulty following? na  Patient expresses understanding of discharge instructions and education provided has no other needs at this time.  yes     [place], Care Management  [phone], [place], [place] [address] Phone: [phone] Email: [email or web address]

## 2021-12-10 NOTE — Patient Instructions (Signed)
Please follow up if symptoms do not improve or as needed.    Use the nasal spray twice a day. Take a zyrtec '10mg'$  tablet nightly. Stop the inhaler, as this can make you feel jittery or affect your sleep.  Breathe through your mouth as needed.

## 2021-12-15 ENCOUNTER — Ambulatory Visit (INDEPENDENT_AMBULATORY_CARE_PROVIDER_SITE_OTHER): Payer: Medicare Other | Admitting: Family Medicine

## 2021-12-15 ENCOUNTER — Encounter: Payer: Self-pay | Admitting: Family Medicine

## 2021-12-15 VITALS — BP 160/72 | HR 60 | Temp 98.6°F | Ht 66.0 in | Wt 205.2 lb

## 2021-12-15 DIAGNOSIS — B9689 Other specified bacterial agents as the cause of diseases classified elsewhere: Secondary | ICD-10-CM | POA: Diagnosis not present

## 2021-12-15 DIAGNOSIS — J208 Acute bronchitis due to other specified organisms: Secondary | ICD-10-CM | POA: Diagnosis not present

## 2021-12-15 MED ORDER — AZITHROMYCIN 250 MG PO TABS: ORAL_TABLET | ORAL | 0 refills | Status: DC

## 2021-12-15 NOTE — Progress Notes (Signed)
Subjective  CC:  Chief Complaint  Patient presents with   URI    Pt stated that he is still not feeling any better    HPI: [name] is a 88 y.o. male who presents to the office today to address the problems listed above in the chief complaint. Still not feeling well. However, no more significant nasal congestion but now has congestion in chest and wants to cough it up but can't. No sob or wheezing or fevers at this point. See last notes.   Assessment  1. Acute bacterial bronchitis      Plan  Viral vs bacterial:  given prolonged course and multiple comorbidities, will treat with zpak and monitor for one-two more weeks. Education given. He is improving overall.   Follow up: as scheduled  03/11/2022  No orders of the defined types were placed in this encounter.  Meds ordered this encounter  Medications   azithromycin (ZITHROMAX) 250 MG tablet    Sig: Take 2 tabs today, then 1 tab daily for 4 days    Dispense:  1 each    Refill:  0      I reviewed the patients updated PMH, FH, and SocHx.    Patient Active Problem List   Diagnosis Date Noted   Mild cognitive impairment 03/05/2021    Priority: High   Moderate nonproliferative diabetic retinopathy of left eye ([place]) 06/18/2020    Priority: High   Stage 3b chronic kidney disease ([place]) 05/26/2020    Priority: High   Aortic stenosis, moderate 05/22/2019    Priority: High   Chronic diastolic CHF (congestive heart failure) ([place]) 03/14/2019    Priority: High   Spinal stenosis of lumbar region 02/21/2018    Priority: High   Diabetic peripheral neuropathy associated with type 2 diabetes mellitus ([place]) 05/05/2017    Priority: High   Atherosclerotic heart disease of native coronary artery without angina pectoris 06/13/2016    Priority: High   CVA (cerebral vascular accident) ([place]) 06/06/2016    Priority: High   Recurrent coronary arteriosclerosis after percutaneous transluminal coronary angioplasty 04/18/2015     Priority: High   CAD S/P PCI- Nov 2016     Priority: High   Current use of long term anticoagulation 02/06/2013    Priority: High   Chronic atrial fibrillation ([place]) 07/30/2011    Priority: High   Hx of CABG x 2 1990     Priority: High   Essential (primary) hypertension     Priority: High   Diabetes mellitus with diabetic nephropathy, with long-term current use of insulin (HCC)     Priority: High   Mixed hyperlipidemia     Priority: High   Thoracic aortic aneurysm without rupture ([place]) 05/26/2020    Priority: Medium    Degeneration of lumbar intervertebral disc 02/21/2018    Priority: Medium    Chronic vertigo 03/15/2014    Priority: Medium    Obesity (BMI 30.0-34.9) 01/31/2012    Priority: Medium    Enlarged prostate without lower urinary tract symptoms (luts) 01/04/2011    Priority: Medium    Gastro-esophageal reflux disease without esophagitis 12/07/2010    Priority: Medium    Epistaxis, recurrent 04/11/2015    Priority: Low   Osteoarthritis, knee 01/31/2012    Priority: Low   Allergic rhinitis 10/28/2011    Priority: Low   Hearing loss 06/08/2011    Priority: Low   Primary osteoarthritis of hand 06/08/2011    Priority: Low   Benign hematuria 08/27/2008  Priority: Low   Stable hemispheric central retinal vein occlusion (CRVO) of right eye 07/21/2020   Lower extremity edema 03/14/2019   Current Meds  Medication Sig   apixaban (ELIQUIS) 5 MG TABS tablet Take 1 tablet (5 mg total) by mouth 2 (two) times daily.   atorvastatin (LIPITOR) 40 MG tablet Take 1 tablet (40 mg total) by mouth daily at 6 PM.   AVODART 0.5 MG capsule Take 0.5 mg by mouth See admin instructions. Take 0.5 mg by mouth every other night- at bedtime   azelastine (ASTELIN) 0.1 % nasal spray Place 2 sprays into both nostrils 2 (two) times daily. Use in each nostril as directed   azithromycin (ZITHROMAX) 250 MG tablet Take 2 tabs today, then 1 tab daily for 4 days   cetirizine (ZYRTEC) 10 MG tablet  Take 1 tablet (10 mg total) by mouth daily.   Cholecalciferol (VITAMIN D3 PO) Take 1 capsule by mouth daily.   diltiazem (TIAZAC) 360 MG 24 hr capsule Take 360 mg by mouth daily.   empagliflozin (JARDIANCE) 25 MG TABS tablet Take 1 tablet (25 mg total) by mouth daily before breakfast.   furosemide (LASIX) 40 MG tablet Take 1 tablet (40 mg total) by mouth daily.   gabapentin (NEURONTIN) 300 MG capsule Take 1 capsule (300 mg total) by mouth 3 (three) times daily.   guaifenesin (HUMIBID E) 400 MG TABS tablet TAKE ONE TABLET BY MOUTH THREE TIMES A DAY AS NEEDED FOR MUCUS   insulin glargine (LANTUS) 100 UNIT/ML injection Inject 40 Units into the skin every morning.   metFORMIN (GLUCOPHAGE) 500 MG tablet Take 500 mg by mouth 2 (two) times daily with a meal.   metoprolol succinate (TOPROL-XL) 100 MG 24 hr tablet Take 1 tablet (100 mg total) by mouth daily. Take with or immediately following a meal. (Patient taking differently: Take 100 mg by mouth in the morning.)   Multiple Vitamin (MULTIVITAMIN) tablet Take 1 tablet by mouth daily with breakfast.   nitroGLYCERIN (NITROSTAT) 0.4 MG SL tablet Place 0.4 mg under the tongue every 5 (five) minutes as needed for chest pain (x 3 doses).   pantoprazole (PROTONIX) 40 MG tablet TAKE 1 TABLET BY MOUTH DAILY AT NOON (Patient taking differently: Take 40 mg by mouth daily in the afternoon.)   tamsulosin (FLOMAX) 0.4 MG CAPS capsule Take 0.4 mg by mouth every other day.   TYLENOL 500 MG tablet Take 500-1,000 mg by mouth every 6 (six) hours as needed for mild pain or headache.    Allergies: Patient is allergic to septra [sulfamethoxazole-trimethoprim], cefadroxil, ciprofloxacin, and erythromycin. Family History: Patient family history includes Brain cancer in his father; Throat cancer in his brother. Social History:  Patient  reports that he quit smoking about 63 years ago. His smoking use included cigarettes. He has a 30.00 pack-year smoking history. He has never  used smokeless tobacco. He reports that he does not drink alcohol and does not use drugs.  Review of Systems: Constitutional: Negative for fever malaise or anorexia Cardiovascular: negative for chest pain Respiratory: negative for SOB or persistent cough Gastrointestinal: negative for abdominal pain  Objective  Vitals: BP (!) 160/72   Pulse 60   Temp 98.6 F (37 C)   Ht '5\' 6"'$  (1.676 m)   Wt 205 lb 3.2 oz (93.1 kg)   SpO2 98%   BMI 33.12 kg/m  General: no acute distress , A&Ox3, normal respirations. HEENT: PEERL, conjunctiva normal, neck is supple Cardiovascular:  RRR without murmur or gallop.  Respiratory:  Good breath sounds bilaterally, CTAB with normal respiratory effort Skin:  Warm, no rashes    Commons side effects, risks, benefits, and alternatives for medications and treatment plan prescribed today were discussed, and the patient expressed understanding of the given instructions. Patient is instructed to call or message via MyChart if he/she has any questions or concerns regarding our treatment plan. No barriers to understanding were identified. We discussed Red Flag symptoms and signs in detail. Patient expressed understanding regarding what to do in case of urgent or emergency type symptoms.  Medication list was reconciled, printed and provided to the patient in AVS. Patient instructions and summary information was reviewed with the patient as documented in the AVS. This note was prepared with assistance of Dragon voice recognition software. Occasional wrong-word or sound-a-like substitutions may have occurred due to the inherent limitations of voice recognition software  This visit occurred during the SARS-CoV-2 public health emergency.  Safety protocols were in place, including screening questions prior to the visit, additional usage of staff PPE, and extensive cleaning of exam room while observing appropriate contact time as indicated for disinfecting solutions.

## 2022-01-18 ENCOUNTER — Other Ambulatory Visit: Payer: Self-pay | Admitting: Family Medicine

## 2022-01-18 DIAGNOSIS — M19042 Primary osteoarthritis, left hand: Secondary | ICD-10-CM | POA: Insufficient documentation

## 2022-01-18 MED ORDER — TRAMADOL HCL 50 MG PO TABS: 50.0000 mg | ORAL_TABLET | Freq: Every day | ORAL | 1 refills | Status: DC | PRN

## 2022-01-18 NOTE — Progress Notes (Signed)
1st MCP osteoarthritis pain daily now. Due to weather changes. Otc meds are not helping.

## 2022-01-19 ENCOUNTER — Telehealth: Payer: Self-pay | Admitting: Pharmacist

## 2022-01-19 NOTE — Progress Notes (Signed)
Chronic Care Management Pharmacy Assistant   Name: [name]  MRN: [id number] DOB: 1929/01/14   Reason for Encounter: Diabetes Adherence Call    Recent office visits:  12/15/2021 [facility] (PCP) [name], MD; will treat with zpak and monitor for one-two more weeks.  12/10/2021 OV (PCP) [name], MD; Recommend Zyrtec nightly and Astelin.  He may stop the albuterol and Mucinex   12/07/2021 OV (PCP) [name], MD;  We will wean off albuterol over the next 1 to 2 weeks.   10/14/2021 [facility] (PCP) [name], MD; no medication changes noted.  09/30/2021 [facility] (Fam Med) Loralee Pacas, MD;  I agreed to a few days of percocet to manage the pain since the lymphadenitis seems to be improving and he just needs pain mgmt. Also I had him see his pcp in a week or 2 to make sure the augmentin resolves the large lymph node problem- might need extended course.   Recent consult visits:  11/27/2021 [facility] (Podiatry) Gardiner Barefoot, DPM; no medication changes noted.  10/22/2021 [facility] (Cardiology) [place], Peter M, MD; no medication changes noted.  08/19/2021 [facility] (Podiatry) Gardiner Barefoot, DPM; no medication changes noted.  Hospital visits:  12/05/2021 ED visit for Wheezing  We will give him the albuterol and he will follow-up with his PCP.   10/10/2021 ED to Hospital Admission due to Community acquired pneumonia Admit date: 10/10/2021 Discharge date: 10/12/2021 -Added PRN second dose of lasix to take for fluid/edema    10/04/2021 ED to Hospital Admission due to Sepsis Admit date: 10/04/2021 Discharge date: 10/07/2021 He was started empirically on Rocephin and azithromycin he defervesced leukocytosis improved. He was transition to oral antibiotics with and he will finish 7-day as an outpatient.   09/29/2021 ED visit for Lymphadenitis -Rx Augmentin 875-125 mg every 12 houra -Rx Lidocaine 5%  Medications: Outpatient Encounter Medications as of 01/19/2022  Medication Sig Note   apixaban  (ELIQUIS) 5 MG TABS tablet Take 1 tablet (5 mg total) by mouth 2 (two) times daily. 10/10/2021: I do not see any recent fills for this, but the patient's wife and daughter assured me he IS taking it.   atorvastatin (LIPITOR) 40 MG tablet Take 1 tablet (40 mg total) by mouth daily at 6 PM.    AVODART 0.5 MG capsule Take 0.5 mg by mouth See admin instructions. Take 0.5 mg by mouth every other night- at bedtime    azelastine (ASTELIN) 0.1 % nasal spray Place 2 sprays into both nostrils 2 (two) times daily. Use in each nostril as directed    azithromycin (ZITHROMAX) 250 MG tablet Take 2 tabs today, then 1 tab daily for 4 days    cetirizine (ZYRTEC) 10 MG tablet Take 1 tablet (10 mg total) by mouth daily.    Cholecalciferol (VITAMIN D3 PO) Take 1 capsule by mouth daily.    diltiazem (TIAZAC) 360 MG 24 hr capsule Take 360 mg by mouth daily.    empagliflozin (JARDIANCE) 25 MG TABS tablet Take 1 tablet (25 mg total) by mouth daily before breakfast.    furosemide (LASIX) 40 MG tablet Take 1 tablet (40 mg total) by mouth daily.    gabapentin (NEURONTIN) 300 MG capsule Take 1 capsule (300 mg total) by mouth 3 (three) times daily.    guaifenesin (HUMIBID E) 400 MG TABS tablet TAKE ONE TABLET BY MOUTH THREE TIMES A DAY AS NEEDED FOR MUCUS    insulin glargine (LANTUS) 100 UNIT/ML injection Inject 40 Units into  the skin every morning.    metFORMIN (GLUCOPHAGE) 500 MG tablet Take 500 mg by mouth 2 (two) times daily with a meal.    metoprolol succinate (TOPROL-XL) 100 MG 24 hr tablet Take 1 tablet (100 mg total) by mouth daily. Take with or immediately following a meal. (Patient taking differently: Take 100 mg by mouth in the morning.)    Multiple Vitamin (MULTIVITAMIN) tablet Take 1 tablet by mouth daily with breakfast.    nitroGLYCERIN (NITROSTAT) 0.4 MG SL tablet Place 0.4 mg under the tongue every 5 (five) minutes as needed for chest pain (x 3 doses).    pantoprazole (PROTONIX) 40 MG tablet TAKE 1 TABLET BY MOUTH  DAILY AT NOON (Patient taking differently: Take 40 mg by mouth daily in the afternoon.)    tamsulosin (FLOMAX) 0.4 MG CAPS capsule Take 0.4 mg by mouth every other day.    traMADol (ULTRAM) 50 MG tablet Take 1 tablet (50 mg total) by mouth daily as needed (hand pain).    TYLENOL 500 MG tablet Take 500-1,000 mg by mouth every 6 (six) hours as needed for mild pain or headache.    No facility-administered encounter medications on file as of 01/19/2022.   Recent Relevant Labs: Lab Results  Component Value Date/Time   HGBA1C 7.7 (A) 11/11/2021 10:06 AM   HGBA1C 7.6 (A) 05/11/2021 10:27 AM   HGBA1C 8.1 (H) 05/26/2020 10:11 AM   HGBA1C 8.4 11/21/2019 12:00 AM   HGBA1C 9.0 02/09/2018 12:00 AM   MICROALBUR <0.7 03/14/2019 08:37 AM   MICROALBUR 3.5 (H) 08/01/2017 09:40 AM    Kidney Function Lab Results  Component Value Date/Time   CREATININE 1.21 12/05/2021 12:32 PM   CREATININE 1.25 10/30/2021 10:12 AM   CREATININE 1.08 03/18/2015 11:31 AM   CREATININE 1.27 (H) 01/06/2015 10:26 AM   GFR 46.93 (L) 04/14/2021 12:00 PM   GFRNONAA 57 (L) 12/05/2021 12:32 PM   GFRAA 57 (L) 01/17/2020 11:14 AM    Current antihyperglycemic regimen:  Jardiance 25 mg Lantus 40 units daily  What recent interventions/DTPs have been made to improve glycemic control:  No recent interventions or DTPs.  Have there been any recent hospitalizations or ED visits since last visit with CPP? Yes  Patient denies hypoglycemic symptoms.  Patient denies hyperglycemic symptoms.  How often are you checking your blood sugar? once daily  What are your blood sugars ranging?  Fasting: 90-120, today (01/19/2022) 107  During the week, how often does your blood glucose drop below 70? "Once every 4 months or so."  Are you checking your feet daily/regularly? Yes  Adherence Review: Is the patient currently on a STATIN medication? No Is the patient currently on ACE/ARB medication? No Does the patient have >5 day gap between  last estimated fill dates? No  -patient scheduled a follow up telephone call with clinical pharmacist 03/08/2021 at 10:30 am.  Care Gaps: Medicare Annual Wellness: Scheduled 03/11/2022 Ophthalmology Exam: Next due on 05/07/2022 Foot Exam: Next due on 08/04/2022 Hemoglobin A1C: 7.7% on 11/11/2021  Future Appointments  Date Time Provider [place]  01/27/2022 10:40 AM [place], Peter M, MD [facility] None  03/03/2022 10:45 AM Gardiner Barefoot, DPM [facility]  03/08/2022 10:30 AM [facility] CCM PHARMACIST [facility]  03/11/2022  8:45 AM [facility] HEALTH COACH [facility]  03/11/2022 10:00 AM [name], MD [facility]  05/17/2022  1:00 PM [place], Clent Demark, MD [facility] None   Star Rating Drugs: None  April D Calhoun, [place] Pharmacist Assistant [phone]

## 2022-01-22 NOTE — Progress Notes (Signed)
Cardiology Office Note   Date:  10/22/2021   ID:  [name], DOB 1928-12-20, MRN [id number]  PCP:  [name], MD  Cardiologist:  Daily Doe Martinique, MD EP: None  Chief Complaint  Patient presents with   Congestive Heart Failure       History of Present Illness: [name] is a 88 y.o. male with PMH of CAD s/p CABG in 1990 with subsequent DES to RCA in 2006, permanent atrial fibrillation, chronic diastolic CHF, aortic stenosis, HTN, HLD, carotid artery disease, DM type 2, CVA, who presents for  follow-up.   He was  admitted to the hospital 05/13/19-05/18/19 for acute respiratory failure 2/2 PNA and pleural effusion, managed with IV antibiotics and a thoracentesis. Cardiology followed that admission for assistance with atrial fibrillation with RVR and he was discharged home on diltiazem '360mg'$  and metoprolol succinate '100mg'$  BID for rate control, apixaban '5mg'$  BID for stroke ppx, and lasix '40mg'$  daily for CHF. Echo 05/14/19 showed EF 60-65%, moderate concentric LVH, indeterminate LV diastolic function, mild AI, moderate AS, and ascending aortic aneurysm of 4.8cm (up from 4.3cm 09/2018).   He was admitted 8/6-10/07/21 with sepsis due to PNA. Managed with antibiotics. He was readmitted on 8/12-8/14 with edema and some CHF. He was continued on therapy for PNA. Swallowing evaluation was negative for aspiration. BNP 207.5. Echocardiogram with EF 55 to 60%, no regional wall motion abnormality, increasing right ventricular pressure and volume overload.  No significant change from previous. Responded to diuretics. DC on lasix 40 mg bid.   In October he was treated for acute bronchitis.  He has done well since then without cough. He did note 2 days ago increased swelling in his feet. Weight is up. Mild SOB. No chest pain or palpitations.     Past Medical History:  Diagnosis Date   Abdominal pain    Acute renal failure ([place])     resolved   Arthritis    "hands & legs" (11/'10/2014)    Ataxia    Atrial fibrillation ([facility])    Bladder outlet obstruction    Bladder outlet obstruction    BPH (benign prostatic hyperplasia)    CAD (coronary artery disease)    a. CABG IN 1989. b. 01/08/2015 CTO of ost LAD, LIMA to LAD not visualized but assumed patent given myoview finding, occluded SVG to diagonal, 99% mid RCA tx w/ SYNERGY DES 3X28 mm   Constipation    Diabetes mellitus type 2, insulin dependent ([place])    Epistaxis, recurrent Feb 2017   GERD (gastroesophageal reflux disease)    HTN (hypertension)    Hx of bacterial pneumonia    Hyperlipemia    Kidney stones    Mild aortic stenosis    Mild cognitive impairment 03/05/2021   MMSE 26/30 and 6CIT 9 03/2021 AWV   Pneumonia 04/2019   Rib fractures    left rib fractures being treated with pain medications   Stented coronary artery Nov 2016   RCA DES   Urinary tract infection     Enterococcus    Past Surgical History:  Procedure Laterality Date   [place] N/A 01/08/2015   Procedure: Left Heart Cath and Cors/Grafts Angiography;  Surgeon: Lindee Leason M Martinique, MD;  Location: [facility];  Service: Cardiovascular;  Laterality: N/A;   CARDIAC CATHETERIZATION  01/08/2015   Procedure: Coronary Stent Intervention;  Surgeon: Loana Salvaggio M [place]ique, MD;  Location: [facility];  Service: Cardiovascular;;  CARPAL TUNNEL RELEASE Right 08/2010   CATARACT EXTRACTION W/ INTRAOCULAR LENS  IMPLANT, BILATERAL Bilateral    CORONARY ANGIOPLASTY  01/08/15   RCA DES   CORONARY ARTERY BYPASS GRAFT  1989   "CABG X 2"   IR THORACENTESIS ASP PLEURAL SPACE W/IMG GUIDE  05/14/2019   JOINT REPLACEMENT     TONSILLECTOMY     TOTAL HIP ARTHROPLASTY Right 2000     Current Outpatient Medications  Medication Sig Dispense Refill   apixaban (ELIQUIS) 5 MG TABS tablet Take 1 tablet (5 mg total) by mouth 2 (two) times daily. 60 tablet 2   atorvastatin (LIPITOR) 40 MG tablet Take 1 tablet (40 mg total) by mouth  daily at 6 PM. 30 tablet 2   AVODART 0.5 MG capsule Take 0.5 mg by mouth See admin instructions. Take 0.5 mg by mouth every other night- at bedtime     Cholecalciferol (VITAMIN D3 PO) Take 1 capsule by mouth daily.     diltiazem (TIAZAC) 360 MG 24 hr capsule Take 360 mg by mouth daily.     empagliflozin (JARDIANCE) 25 MG TABS tablet Take 1 tablet (25 mg total) by mouth daily before breakfast. 30 tablet    furosemide (LASIX) 40 MG tablet Take 1 tablet (40 mg total) by mouth every morning. May also take 1 tablet (40 mg total) daily as needed for fluid or edema (can take second dose in the afternoon (at least 6 hours after morning dose) for fluid or swelling). TAKE 1 TABLET(40 MG) BY MOUTH DAILY. 60 tablet 0   gabapentin (NEURONTIN) 300 MG capsule TAKE 1 CAPSULE(300 MG) BY MOUTH THREE TIMES DAILY (Patient taking differently: Take 300 mg by mouth 3 (three) times daily.) 270 capsule 3   insulin glargine (LANTUS) 100 UNIT/ML injection Inject 40 Units into the skin every morning.     metFORMIN (GLUCOPHAGE) 500 MG tablet Take 500 mg by mouth 2 (two) times daily with a meal.     metoprolol succinate (TOPROL-XL) 100 MG 24 hr tablet Take 1 tablet (100 mg total) by mouth daily. Take with or immediately following a meal. (Patient taking differently: Take 100 mg by mouth in the morning.) 180 tablet 2   Multiple Vitamin (MULTIVITAMIN) tablet Take 1 tablet by mouth daily with breakfast.     nitroGLYCERIN (NITROSTAT) 0.4 MG SL tablet Place 0.4 mg under the tongue every 5 (five) minutes as needed for chest pain (x 3 doses).     pantoprazole (PROTONIX) 40 MG tablet TAKE 1 TABLET BY MOUTH DAILY AT NOON (Patient taking differently: Take 40 mg by mouth daily in the afternoon.) 90 tablet 3   potassium chloride SA (KLOR-CON) 20 MEQ tablet TAKE 1 TABLET(20 MEQ) BY MOUTH DAILY (Patient taking differently: Take 20 mEq by mouth daily.) 90 tablet 3   tamsulosin (FLOMAX) 0.4 MG CAPS capsule Take 0.4 mg by mouth every other day.      TYLENOL 500 MG tablet Take 500-1,000 mg by mouth every 6 (six) hours as needed for mild pain or headache.     No current facility-administered medications for this visit.    Allergies:   Septra [sulfamethoxazole-trimethoprim], Cefadroxil, Ciprofloxacin, and Erythromycin    Social History:  The patient  reports that he quit smoking about 63 years ago. His smoking use included cigarettes. He has a 30.00 pack-year smoking history. He has never used smokeless tobacco. He reports that he does not drink alcohol and does not use drugs.   Family History:  The patient's family history  includes Brain cancer in his father; Throat cancer in his brother.    ROS:  Please see the history of present illness.   Otherwise, review of systems are positive for none.   All other systems are reviewed and negative.    PHYSICAL EXAM: VS:  BP 126/68   Pulse (!) 113   Ht '5\' 6"'$  (1.676 m)   Wt 200 lb 6.4 oz (90.9 kg)   SpO2 98%   BMI 32.35 kg/m  , BMI Body mass index is 32.35 kg/m. GEN: Well nourished, well developed, in no acute distress. Walks with cane HEENT: normal Neck: no JVD, carotid bruits, or masses Cardiac: IRIR; gr 2/6 systolic murmur RUSB>>apex, rubs, or gallops, 1+ pedal  edema  Respiratory:  clear to auscultation bilaterally, normal work of breathing GI: soft, nontender, nondistended, + BS MS: no deformity or atrophy. No biceps tenderness Skin: warm and dry, no rash Neuro:  Strength and sensation are intact Psych: euthymic mood, full affect   EKG:  EKG is  not ordered today.      Recent Labs: 10/10/2021: ALT 33; B Natriuretic Peptide 207.5; Magnesium 1.8 10/12/2021: BUN 19; Creatinine, Ser 1.07; Hemoglobin 9.0; Platelets 381; Potassium 4.1; Sodium 142    Lipid Panel    Component Value Date/Time   CHOL 154 04/14/2021 1200   TRIG 119.0 04/14/2021 1200   HDL 54.70 04/14/2021 1200   CHOLHDL 3 04/14/2021 1200   VLDL 23.8 04/14/2021 1200   LDLCALC 75 04/14/2021 1200      Wt  Readings from Last 3 Encounters:  10/22/21 200 lb 6.4 oz (90.9 kg)  10/14/21 198 lb (89.8 kg)  10/12/21 206 lb 5.6 oz (93.6 kg)      Other studies Reviewed: Additional studies/ records that were reviewed today include:   2D echo 05/14/2019 IMPRESSIONS    1. Left ventricular ejection fraction, by estimation, is 60 to 65%. The  left ventricle has normal function. The left ventricle has no regional  wall motion abnormalities. There is moderate concentric left ventricular  hypertrophy. Left ventricular  diastolic parameters are indeterminate.   2. Right ventricular systolic function is normal. The right ventricular  size is normal.   3. The mitral valve is normal in structure. No evidence of mitral valve  regurgitation. No evidence of mitral stenosis.   4. The aortic valve is normal in structure. Aortic valve regurgitation is  mild. Moderate aortic valve stenosis. Aortic valve area, by VTI measures  1.27 cm. Aortic valve mean gradient measures 19.3 mmHg. Aortic valve Vmax  measures 2.75 m/s.   5. Compared with th echo 09/2018, ascending aorta has increased from 4.3  cm to 4.8cm. . Aortic dilatation noted. There is moderate dilatation of  the ascending aorta measuring 48 mm.   6. The inferior vena cava is normal in size with greater than 50%  respiratory variability, suggesting right atrial pressure of 3 mmHg.   Left heart catheterization 2016: Prox RCA lesion, 40% stenosed. LM lesion, 20% stenosed. Ost LAD to Prox LAD lesion, 100% stenosed. SVG . Origin lesion, 100% stenosed. Mid RCA-2 lesion, 70% stenosed. Mid RCA-1 lesion, 99% stenosed. Post intervention, there is a 0% residual stenosis.   1. Severe 2 vessel obstructive CAD. CTO of the origin of the LAD. Critical mid RCA stenosis. 2. Occluded SVG to the diagonal 3. LIMA to the LAD was not visualized but assumed patent based on clinical history and Myoview findings. 4. Normal LV EDP. 5. Successful stenting of the Mid RCA with  DES. Very difficult procedure due to vessel tortuosity.   Plan: DAPT with ASA and Plavix for one month then stop ASA and continue Plavix for at least one year. Resume Coumadin tomorrow. Will assess LV function with an Echo. Stop prilosec and start protonix. Anticipate DC in am if stable. Patient noted to be bradycardic throughout case so will reduce metoprolol to 50 mg daily.  CT CHEST WITHOUT CONTRAST   TECHNIQUE: Multidetector CT imaging of the chest was performed following the standard protocol without IV contrast.   COMPARISON:  CT angiography from June 02, 2016 and from October 03, 2017   FINDINGS: Cardiovascular: Post median sternotomy for CABG. Ascending thoracic aorta measuring 4.3 cm with stable appearance. Calcified atheromatous plaque of the thoracic aorta and its branches in the chest.   Heart size is stable, extensive calcified coronary artery disease post CABG and LIMA grafting.   Engorged central pulmonary vasculature 3.2 cm caliber of the main pulmonary artery Limited assessment of cardiovascular structures given lack of intravenous contrast.   Mediastinum/Nodes: Thoracic inlet structures are normal. No axillary lymphadenopathy. No mediastinal lymphadenopathy. No gross hilar lymphadenopathy.   Lungs/Pleura: Bandlike opacity with mild ground-glass attenuation in the LEFT lung base. Bandlike changes are present in the LEFT lower lobe. Resolution of pleural fluid seen on previous chest x-rays from March of 2021   Pulmonary nodule in the superior segment of the RIGHT lower lobe 6 mm unchanged from previous imaging (image 49, series 3) no consolidation. No pleural effusion.   (Image 72, series 3) 4 mm nodule with some surrounding ground-glass opacity in the central LEFT lower lobe   Airways are patent.   Upper Abdomen: Incidental imaging of upper abdominal contents with signs of vascular disease of the abdominal aorta. Small hiatal hernia. No acute upper  abdominal process.   Musculoskeletal: No acute bone finding. No destructive bone process.   IMPRESSION: 1. Stable appearance of the mild aneurysmal dilation ascending thoracic aorta measuring 4.3 cm. Recommend annual imaging followup by CTA or MRA. This recommendation follows 2010 ACCF/AHA/AATS/ACR/ASA/SCA/SCAI/SIR/STS/SVM Guidelines for the Diagnosis and Management of Patients with Thoracic Aortic Disease. Circulation. 2010; 121: O756-E332. Aortic aneurysm NOS (ICD10-I71.9) 2. Stable 6 mm nodule in the superior segment of the RIGHT lower lobe. 3. 4 mm nodule with some surrounding ground-glass opacity in the central LEFT lower lobe. This is new compared to prior imaging. No follow-up needed if patient is low-risk. Non-contrast chest CT can be considered in 12 months if patient is high-risk. This recommendation follows the consensus statement: Guidelines for Management of Incidental Pulmonary Nodules Detected on CT Images: From the Fleischner Society 2017; Radiology 2017; 284:228-243. 4. Resolution of pleural fluid seen on previous chest x-rays from March of 2021. Bandlike opacity in the area of prior LEFT lower lobe airspace disease suggests mixture of atelectasis and post infectious scarring. 5. Stable signs of vascular disease of the thoracic aorta and its branches in the chest. 6. Aortic atherosclerosis.   Aortic Atherosclerosis (ICD10-I70.0).   Aortic aneurysm NOS (ICD10-I71.9).     Electronically Signed   By: Zetta Bills M.D.   On: 02/05/2020 08:23  Echo 06/10/20: IMPRESSIONS     1. Left ventricular ejection fraction, by estimation, is 60 to 65%. The  left ventricle has normal function. The left ventricle has no regional  wall motion abnormalities. There is moderate left ventricular hypertrophy.  Left ventricular diastolic function   could not be evaluated.   2. Right ventricular systolic function is mildly reduced. The right  ventricular size is normal. There is  moderately elevated pulmonary artery  systolic pressure.   3. Left atrial size was severely dilated.   4. Right atrial size was moderately dilated.   5. The mitral valve is abnormal. Trivial mitral valve regurgitation.   6. The aortic valve is tricuspid. There is moderate calcification of the  aortic valve. Aortic valve regurgitation is trivial. Mild to moderate  aortic valve stenosis. Aortic valve area, by VTI measures 1.47 cm. Aortic  valve mean gradient measures 18.0  mmHg. Aortic valve Vmax measures 2.78 m/s. DI is 0.30.   7. Aortic dilatation noted. There is mild dilatation of the aortic root,  measuring 43 mm. There is mild dilatation of the ascending aorta,  measuring 43 mm.   8. The inferior vena cava is normal in size with greater than 50%  respiratory variability, suggesting right atrial pressure of 3 mmHg.   Comparison(s): No significant change from prior study. 05/14/2019: LVEF  60-65%, moderate LVH, moderate AS - mean gradient 19.3 mmHg.  IMPRESSIONS     1. Left ventricular ejection fraction, by estimation, is 55 to 60%. The  left ventricle has normal function. The left ventricle has no regional  wall motion abnormalities. There is moderate concentric left ventricular  hypertrophy. Left ventricular  diastolic function could not be evaluated. There is the interventricular  septum is flattened in systole and diastole, consistent with right  ventricular pressure and volume overload.   2. Right ventricular systolic function is mildly reduced. The right  ventricular size is mildly enlarged. There is normal pulmonary artery  systolic pressure. The estimated right ventricular systolic pressure is  73.2 mmHg.   3. Left atrial size was mildly dilated.   4. The mitral valve is degenerative. Trivial mitral valve regurgitation.  No evidence of mitral stenosis.   5. The aortic valve is tricuspid. There is moderate calcification of the  aortic valve. There is moderate thickening of  the aortic valve. Aortic  valve regurgitation is trivial. Mild to moderate aortic valve stenosis.  Aortic valve area, by VTI measures  1.05 cm. Aortic valve mean gradient measures 19.3 mmHg. Aortic valve Vmax  measures 2.92 m/s.   6. Asc aorta up to 40 mm on this study. 48 mm on last study. CT scan  recorded 43 mm. Would defer to cross sectional imaging (CT). There is mild  dilatation of the ascending aorta, measuring 40 mm.   7. The inferior vena cava is normal in size with greater than 50%  respiratory variability, suggesting right atrial pressure of 3 mmHg.   Comparison(s): No significant change from prior study.  Echo 10/10/21:   ASSESSMENT AND PLAN:  1. CAD s/p CABG in 1990 with DES to RCA in 2006:  - Not on aspirin given need for anticoagulation - Continue statin - Continue metoprolol - no active angina  2. Permanent atrial fibrillation: HR is well controlled.  - No bleeding - continue metoprolol and diltiazem  - Continue eliquis for stroke ppx. (does not meet requirements for reduced dose)  3. Acute on Chronic diastolic CHF:  - some increased weight and edema for last few days - increase lasix to 40 mg bid for 4 days then resume 40 mg daily - Continue metoprolol succinate - restrict salt intake  4. HTN: BP is well controlled - Continue metoprolol succinate, diltiazem, and lasix  5. Aortic stenosis: moderate on echo 05/14/19- repeat in August 2023  is unchanged.  - Continue annual surveillance echo's   6.  Ascending aortic aneurysm: last echo 05/14/19 showed 4.8cm aneurysm, up from 4.3 09/2018. More recent CT 02/05/20 showed it was 4.3 cm. Stable. Stable by recent Echo. No really a candidate for surgery.       Disposition:   FU 4 months   Signed, Farhad Burleson Martinique, MD  10/22/2021 10:02 AM

## 2022-01-27 ENCOUNTER — Ambulatory Visit: Payer: Medicare Other | Attending: Cardiology | Admitting: Cardiology

## 2022-01-27 ENCOUNTER — Encounter: Payer: Self-pay | Admitting: Cardiology

## 2022-01-27 VITALS — BP 132/76 | HR 93 | Ht 66.0 in | Wt 209.6 lb

## 2022-01-27 DIAGNOSIS — I2510 Atherosclerotic heart disease of native coronary artery without angina pectoris: Secondary | ICD-10-CM | POA: Insufficient documentation

## 2022-01-27 DIAGNOSIS — I482 Chronic atrial fibrillation, unspecified: Secondary | ICD-10-CM | POA: Diagnosis not present

## 2022-01-27 DIAGNOSIS — Z9861 Coronary angioplasty status: Secondary | ICD-10-CM | POA: Diagnosis not present

## 2022-01-27 DIAGNOSIS — I10 Essential (primary) hypertension: Secondary | ICD-10-CM | POA: Insufficient documentation

## 2022-01-27 DIAGNOSIS — I5033 Acute on chronic diastolic (congestive) heart failure: Secondary | ICD-10-CM | POA: Insufficient documentation

## 2022-01-27 NOTE — Patient Instructions (Signed)
Medication Instructions:  Double Lasix dose for 4 days only then return to normal dose Continue all other medications *If you need a refill on your cardiac medications before your next appointment, please call your pharmacy*   Lab Work: None ordered   Testing/Procedures: None ordered   Follow-Up: At [facility], you and your health needs are our priority.  As part of our continuing mission to provide you with exceptional heart care, we have created designated Provider Care Teams.  These Care Teams include your primary Cardiologist (physician) and Advanced Practice Providers (APPs -  Physician Assistants and Nurse Practitioners) who all work together to provide you with the care you need, when you need it.  We recommend signing up for the patient portal called "MyChart".  Sign up information is provided on this After Visit Summary.  MyChart is used to connect with patients for Virtual Visits (Telemedicine).  Patients are able to view lab/test results, encounter notes, upcoming appointments, etc.  Non-urgent messages can be sent to your provider as well.   To learn more about what you can do with MyChart, go to [email or web address].    Your next appointment:  4 months    The format for your next appointment: Office    Provider:  [place]

## 2022-02-02 ENCOUNTER — Other Ambulatory Visit: Payer: Self-pay | Admitting: Family Medicine

## 2022-02-02 DIAGNOSIS — E113293 Type 2 diabetes mellitus with mild nonproliferative diabetic retinopathy without macular edema, bilateral: Secondary | ICD-10-CM | POA: Diagnosis not present

## 2022-02-02 DIAGNOSIS — Z961 Presence of intraocular lens: Secondary | ICD-10-CM | POA: Diagnosis not present

## 2022-02-02 LAB — HM DIABETES EYE EXAM

## 2022-02-19 ENCOUNTER — Other Ambulatory Visit: Payer: Self-pay | Admitting: Family Medicine

## 2022-02-19 DIAGNOSIS — I10 Essential (primary) hypertension: Secondary | ICD-10-CM

## 2022-02-19 DIAGNOSIS — I2510 Atherosclerotic heart disease of native coronary artery without angina pectoris: Secondary | ICD-10-CM

## 2022-02-19 DIAGNOSIS — I482 Chronic atrial fibrillation, unspecified: Secondary | ICD-10-CM

## 2022-02-19 DIAGNOSIS — Z794 Long term (current) use of insulin: Secondary | ICD-10-CM

## 2022-02-23 ENCOUNTER — Telehealth: Payer: Self-pay

## 2022-02-23 NOTE — Progress Notes (Signed)
  Chronic Care Management   Note  02/23/2022 Name: [name] MRN: [id number] DOB: [birth year]  [name] is a 88 y.o. year old male who is a primary care patient of [name], MD. I reached out to [name] by phone today in response to a referral sent by [name] PCP.  Mr. Cure was given information about Chronic Care Management services today including:  CCM service includes personalized support from designated clinical staff supervised by the physician, including individualized plan of care and coordination with other care providers 24/7 contact phone numbers for assistance for urgent and routine care needs. Service will only be billed when office clinical staff spend 20 minutes or more in a month to coordinate care. Only one practitioner may furnish and bill the service in a calendar month. The patient may stop CCM services at amy time (effective at the end of the month) by phone call to the office staff. The patient will be responsible for cost sharing (co-pay) or up to 20% of the service fee (after annual deductible is met)  [name]  agreedto scheduling an appointment with the CCM RN Case Manager   Follow up plan: Patient agreed to scheduled appointment with RN Case Manager on 02/26/2022(date/time).   Noreene Larsson, [place], [place] [address] Direct Dial: [phone] [name]

## 2022-02-26 ENCOUNTER — Ambulatory Visit (INDEPENDENT_AMBULATORY_CARE_PROVIDER_SITE_OTHER): Payer: Medicare Other | Admitting: *Deleted

## 2022-02-26 DIAGNOSIS — Z794 Long term (current) use of insulin: Secondary | ICD-10-CM

## 2022-02-26 DIAGNOSIS — I10 Essential (primary) hypertension: Secondary | ICD-10-CM

## 2022-02-26 NOTE — Patient Instructions (Signed)
Please call the care guide team at [phone] if you need to cancel or reschedule your appointment.   If you are experiencing a Mental Health or [place] or need someone to talk to, please call the Suicide and Crisis Lifeline: 988 call the [place] National Suicide Prevention Lifeline: 209-855-1504 or TTY: 757-883-8963 TTY 854-162-9727) to talk to a trained counselor call 1-800-273-TALK (toll free, 24 hour hotline) go to [facility] [address], [place] [phone]) call 911   Following is a copy of the CCM Program Consent:  CCM service includes personalized support from designated clinical staff supervised by the physician, including individualized plan of care and coordination with other care providers 24/7 contact phone numbers for assistance for urgent and routine care needs. Service will only be billed when office clinical staff spend 20 minutes or more in a month to coordinate care. Only one practitioner may furnish and bill the service in a calendar month. The patient may stop CCM services at amy time (effective at the end of the month) by phone call to the office staff. The patient will be responsible for cost sharing (co-pay) or up to 20% of the service fee (after annual deductible is met)  Following is a copy of your full provider care plan:   Goals Addressed             This Visit's Progress    CCM (DIABETES) EXPECTED OUTCOME:  MONITOR, SELF-MANAGE AND REDUCE SYMPTOMS OF DIABETES       Current Barriers:  Knowledge Deficits related to Diabetes management Chronic Disease Management support and education needs related to Diabetes, diet No Advanced Directives in place- pt declines information Pt reports he checks CBG once daily with readings in 90-120's range, states drinks unsweetened beverages, does not always follow a special diet  Planned Interventions: Provided education to patient about basic DM disease  process; Reviewed medications with patient and discussed importance of medication adherence;        Reviewed prescribed diet with patient carbohydrate modified; Counseled on importance of regular laboratory monitoring as prescribed;        Discussed plans with patient for ongoing care management follow up and provided patient with direct contact information for care management team;      Provided patient with written educational materials related to hypo and hyperglycemia and importance of correct treatment;       Advised patient, providing education and rationale, to check cbg once daily and record        Review of patient status, including review of consultants reports, relevant laboratory and other test results, and medications completed;       Advised patient to discuss any issues with blood sugar (outside normal parameters)  with provider;      Screening for signs and symptoms of depression related to chronic disease state;        Assessed social determinant of health barriers;        Reviewed importance of exercise Pain assessment completed  Symptom Management: Take medications as prescribed   Attend all scheduled provider appointments Call pharmacy for medication refills 3-7 days in advance of running out of medications Perform all self care activities independently  Perform IADL's (shopping, preparing meals, housekeeping, managing finances) independently Call provider office for new concerns or questions  check blood sugar at prescribed times: once daily check feet daily for cuts, sores or redness enter blood sugar readings and medication or insulin into daily log take the blood  sugar log to all doctor visits take the blood sugar meter to all doctor visits trim toenails straight across drink 6 to 8 glasses of water each day fill half of plate with vegetables limit fast food meals to no more than 1 per week manage portion size read food labels for fat, fiber, carbohydrates and  portion size Try to exercise some daily- even if just walking for a few minutes at a time Look over education sent via my chart- hypoglycemia  Follow Up Plan: Telephone follow up appointment with care management team member scheduled for:  04/28/22 at 1045 am       CCM (HYPERTENSION) EXPECTED OUTCOME: MONITOR, SELF-MANAGE AND REDUCE SYMPTOMS OF HYPERTENSION       Current Barriers:  Knowledge Deficits related to Hypertension management Chronic Disease Management support and education needs related to Hypertension, diet No Advanced Directives in place- pt declines Pt reports he lives with spouse Arthur Speagle, is independent with all aspects of his care, continues to drive Pt reports he has blood pressure cuff but does not monitor and states " it's usually good"  Planned Interventions: Evaluation of current treatment plan related to hypertension self management and patient's adherence to plan as established by provider;   Reviewed prescribed diet low sodium Reviewed medications with patient and discussed importance of compliance;  Counseled on the importance of exercise goals with target of 150 minutes per week Discussed plans with patient for ongoing care management follow up and provided patient with direct contact information for care management team; Advised patient, providing education and rationale, to monitor blood pressure daily and record, calling PCP for findings outside established parameters;  Advised patient to discuss any issues with blood pressure, medications with provider; Provided education on prescribed diet low sodium;  Discussed complications of poorly controlled blood pressure such as heart disease, stroke, circulatory complications, vision complications, kidney impairment, sexual dysfunction;  Screening for signs and symptoms of depression related to chronic disease state;  Assessed social determinant of health barriers;  Reviewed safety precautions  Symptom  Management: Take medications as prescribed   Attend all scheduled provider appointments Call pharmacy for medication refills 3-7 days in advance of running out of medications Perform all self care activities independently  Perform IADL's (shopping, preparing meals, housekeeping, managing finances) independently Call provider office for new concerns or questions  check blood pressure weekly choose a place to take my blood pressure (home, clinic or office, retail store) write blood pressure results in a log or diary learn about high blood pressure keep a blood pressure log take blood pressure log to all doctor appointments call doctor for signs and symptoms of high blood pressure keep all doctor appointments take medications for blood pressure exactly as prescribed report new symptoms to your doctor eat more whole grains, fruits and vegetables, lean meats and healthy fats Look over education sent via my chart- low sodium diet  Follow Up Plan: Telephone follow up appointment with care management team member scheduled for:  04/28/22 at 1045 am          Patient verbalizes understanding of instructions and care plan provided today and agrees to view in [place]. Active MyChart status and patient understanding of how to access instructions and care plan via MyChart confirmed with patient.     Telephone follow up appointment with care management team member scheduled for: 04/28/22 at 1045 am  Low-Sodium Eating Plan Sodium, which is an element that makes up salt, helps you maintain a healthy balance of  fluids in your body. Too much sodium can increase your blood pressure and cause fluid and waste to be held in your body. Your health care provider or dietitian may recommend following this plan if you have high blood pressure (hypertension), kidney disease, liver disease, or heart failure. Eating less sodium can help lower your blood pressure, reduce swelling, and protect your heart, liver, and  kidneys. What are tips for following this plan? Reading food labels The Nutrition Facts label lists the amount of sodium in one serving of the food. If you eat more than one serving, you must multiply the listed amount of sodium by the number of servings. Choose foods with less than 140 mg of sodium per serving. Avoid foods with 300 mg of sodium or more per serving. Shopping  Look for lower-sodium products, often labeled as "low-sodium" or "no salt added." Always check the sodium content, even if foods are labeled as "unsalted" or "no salt added." Buy fresh foods. Avoid canned foods and pre-made or frozen meals. Avoid canned, cured, or processed meats. Buy breads that have less than 80 mg of sodium per slice. Cooking  Eat more home-cooked food and less restaurant, buffet, and fast food. Avoid adding salt when cooking. Use salt-free seasonings or herbs instead of table salt or sea salt. Check with your health care provider or pharmacist before using salt substitutes. Cook with plant-based oils, such as canola, sunflower, or olive oil. Meal planning When eating at a restaurant, ask that your food be prepared with less salt or no salt, if possible. Avoid dishes labeled as brined, pickled, cured, smoked, or made with soy sauce, miso, or teriyaki sauce. Avoid foods that contain MSG (monosodium glutamate). MSG is sometimes added to [place] food, bouillon, and some canned foods. Make meals that can be grilled, baked, poached, roasted, or steamed. These are generally made with less sodium. General information Most people on this plan should limit their sodium intake to 1,500-2,000 mg (milligrams) of sodium each day. What foods should I eat? Fruits Fresh, frozen, or canned fruit. Fruit juice. Vegetables Fresh or frozen vegetables. "No salt added" canned vegetables. "No salt added" tomato sauce and paste. Low-sodium or reduced-sodium tomato and vegetable juice. Grains Low-sodium cereals,  including oats, puffed wheat and rice, and shredded wheat. Low-sodium crackers. Unsalted rice. Unsalted pasta. Low-sodium bread. Whole-grain breads and whole-grain pasta. Meats and other proteins Fresh or frozen (no salt added) meat, poultry, seafood, and fish. Low-sodium canned tuna and salmon. Unsalted nuts. Dried peas, beans, and lentils without added salt. Unsalted canned beans. Eggs. Unsalted nut butters. Dairy Milk. Soy milk. Cheese that is naturally low in sodium, such as ricotta cheese, fresh mozzarella, or Swiss cheese. Low-sodium or reduced-sodium cheese. Cream cheese. Yogurt. Seasonings and condiments Fresh and dried herbs and spices. Salt-free seasonings. Low-sodium mustard and ketchup. Sodium-free salad dressing. Sodium-free light mayonnaise. Fresh or refrigerated horseradish. Lemon juice. Vinegar. Other foods Homemade, reduced-sodium, or low-sodium soups. Unsalted popcorn and pretzels. Low-salt or salt-free chips. The items listed above may not be a complete list of foods and beverages you can eat. Contact a dietitian for more information. What foods should I avoid? Vegetables Sauerkraut, pickled vegetables, and relishes. Olives. [place] fries. Onion rings. Regular canned vegetables (not low-sodium or reduced-sodium). Regular canned tomato sauce and paste (not low-sodium or reduced-sodium). Regular tomato and vegetable juice (not low-sodium or reduced-sodium). Frozen vegetables in sauces. Grains Instant hot cereals. Bread stuffing, pancake, and biscuit mixes. Croutons. Seasoned rice or pasta mixes. Noodle soup cups. Boxed  or frozen macaroni and cheese. Regular salted crackers. Self-rising flour. Meats and other proteins Meat or fish that is salted, canned, smoked, spiced, or pickled. Precooked or cured meat, such as sausages or meat loaves. Berniece Salines. Ham. Pepperoni. Hot dogs. Corned beef. Chipped beef. Salt pork. Jerky. Pickled herring. Anchovies and sardines. Regular canned tuna. Salted  nuts. Dairy Processed cheese and cheese spreads. Hard cheeses. Cheese curds. Blue cheese. Feta cheese. String cheese. Regular cottage cheese. Buttermilk. Canned milk. Fats and oils Salted butter. Regular margarine. Ghee. Bacon fat. Seasonings and condiments Onion salt, garlic salt, seasoned salt, table salt, and sea salt. Canned and packaged gravies. Worcestershire sauce. Tartar sauce. Barbecue sauce. Teriyaki sauce. Soy sauce, including reduced-sodium. Steak sauce. Fish sauce. Oyster sauce. Cocktail sauce. Horseradish that you find on the shelf. Regular ketchup and mustard. Meat flavorings and tenderizers. Bouillon cubes. Hot sauce. Pre-made or packaged marinades. Pre-made or packaged taco seasonings. Relishes. Regular salad dressings. Salsa. Other foods Salted popcorn and pretzels. Corn chips and puffs. Potato and tortilla chips. Canned or dried soups. Pizza. Frozen entrees and pot pies. The items listed above may not be a complete list of foods and beverages you should avoid. Contact a dietitian for more information. Summary Eating less sodium can help lower your blood pressure, reduce swelling, and protect your heart, liver, and kidneys. Most people on this plan should limit their sodium intake to 1,500-2,000 mg (milligrams) of sodium each day. Canned, boxed, and frozen foods are high in sodium. Restaurant foods, fast foods, and pizza are also very high in sodium. You also get sodium by adding salt to food. Try to cook at home, eat more fresh fruits and vegetables, and eat less fast food and canned, processed, or prepared foods. This information is not intended to replace advice given to you by your health care provider. Make sure you discuss any questions you have with your health care provider. Document Revised: 03/23/2019 Document Reviewed: 01/17/2019 Elsevier Patient Education  [place]. Hypoglycemia Hypoglycemia is when the sugar (glucose) level in your blood is too low. Low  blood sugar can happen to people who have diabetes and people who do not have diabetes. Low blood sugar can happen quickly, and it can be an emergency. What are the causes? This condition happens most often in people who have diabetes. It may be caused by: Diabetes medicine. Not eating enough, or not eating often enough. Doing more physical activity. Drinking alcohol on an empty stomach. If you do not have diabetes, this condition may be caused by: A tumor in the pancreas. Not eating enough, or not eating for long periods at a time (fasting). A very bad infection or illness. Problems after having weight loss (bariatric) surgery. Kidney failure or liver failure. Certain medicines. What increases the risk? This condition is more likely to develop in people who: Have diabetes and take medicines to lower their blood sugar. Abuse alcohol. Have a very bad illness. What are the signs or symptoms? Mild Hunger. Sweating and feeling clammy. Feeling dizzy or light-headed. Being sleepy or having trouble sleeping. Feeling like you may vomit (nauseous). A fast heartbeat. A headache. Blurry vision. Mood changes, such as: Being grouchy. Feeling worried or nervous (anxious). Tingling or loss of feeling (numbness) around your mouth, lips, or tongue. Moderate Confusion and poor judgment. Behavior changes. Weakness. Uneven heartbeat. Trouble with moving (coordination). Very low Very low blood sugar (severe hypoglycemia) is a medical emergency. It can cause: Fainting. Seizures. Loss of consciousness (coma). Death. How is this  treated? Treating low blood sugar Low blood sugar is often treated by eating or drinking something that has sugar in it right away. The food or drink should contain 15 grams of a fast-acting carb (carbohydrate). Options include: 4 oz (120 mL) of fruit juice. 4 oz (120 mL) of regular soda (not diet soda). A few pieces of hard candy. Check food labels to see how many  pieces to eat for 15 grams. 1 Tbsp (15 mL) of sugar or honey. 4 glucose tablets. 1 tube of glucose gel. Treating low blood sugar if you have diabetes If you can think clearly and swallow safely, follow the 15:15 rule: Take 15 grams of a fast-acting carb. Talk with your doctor about how much you should take. Always keep a source of fast-acting carb with you, such as: Glucose tablets (take 4 tablets). A few pieces of hard candy. Check food labels to see how many pieces to eat for 15 grams. 4 oz (120 mL) of fruit juice. 4 oz (120 mL) of regular soda (not diet soda). 1 Tbsp (15 mL) of honey or sugar. 1 tube of glucose gel. Check your blood sugar 15 minutes after you take the carb. If your blood sugar is still at or below 70 mg/dL (3.9 mmol/L), take 15 grams of a carb again. If your blood sugar does not go above 70 mg/dL (3.9 mmol/L) after 3 tries, get help right away. After your blood sugar goes back to normal, eat a meal or a snack within 1 hour.  Treating very low blood sugar If your blood sugar is below 54 mg/dL (3 mmol/L), you have very low blood sugar, or severe hypoglycemia. This is an emergency. Get medical help right away. If you have very low blood sugar and you cannot eat or drink, you will need to be given a hormone called glucagon. A family member or friend should learn how to check your blood sugar and how to give you glucagon. Ask your doctor if you need to have an emergency glucagon kit at home. Very low blood sugar may also need to be treated in a hospital. Follow these instructions at home: General instructions Take over-the-counter and prescription medicines only as told by your doctor. Stay aware of your blood sugar as told by your doctor. If you drink alcohol: Limit how much you have to: 0-1 drink a day for women who are not pregnant. 0-2 drinks a day for men. Know how much alcohol is in your drink. In the [place] one drink equals one 12 oz bottle of beer (355 mL), one 5  oz glass of wine (148 mL), or one 1 oz glass of hard liquor (44 mL). Be sure to eat food when you drink alcohol. Know that your body absorbs alcohol quickly. This may lead to low blood sugar later. Be sure to keep checking your blood sugar. Keep all follow-up visits. If you have diabetes:  Always have a fast-acting carb (15 grams) with you to treat low blood sugar. Follow your diabetes care plan as told by your doctor. Make sure you: Know the symptoms of low blood sugar. Check your blood sugar as often as told. Always check it before and after exercise. Always check your blood sugar before you drive. Take your medicines as told. Follow your meal plan. Eat on time. Do not skip meals. Share your diabetes care plan with: Your work or school. People you live with. Carry a card or wear jewelry that says you have diabetes. Where to  find more information American Diabetes Association: www.diabetes.org Contact a doctor if: You have trouble keeping your blood sugar in your target range. You have low blood sugar often. Get help right away if: You still have symptoms after you eat or drink something that contains 15 grams of fast-acting carb, and you cannot get your blood sugar above 70 mg/dL by following the 15:15 rule. Your blood sugar is below 54 mg/dL (3 mmol/L). You have a seizure. You faint. These symptoms may be an emergency. Get help right away. Call your local emergency services (911 in the [place] Do not wait to see if the symptoms will go away. Do not drive yourself to the hospital. Summary Hypoglycemia happens when the level of sugar (glucose) in your blood is too low. Low blood sugar can happen to people who have diabetes and people who do not have diabetes. Low blood sugar can happen quickly, and it can be an emergency. Make sure you know the symptoms of low blood sugar and know how to treat it. Always keep a source of sugar (fast-acting carb) with you to treat low blood  sugar. This information is not intended to replace advice given to you by your health care provider. Make sure you discuss any questions you have with your health care provider. Document Revised: 01/17/2020 Document Reviewed: 01/17/2020 Elsevier Patient Education  [place].

## 2022-02-26 NOTE — Plan of Care (Signed)
Chronic Care Management Provider Comprehensive Care Plan    02/26/2022 Name: [name] MRN: [id number] DOB: 06-10-1928  Referral to Chronic Care Management (CCM) services was placed by Provider:  Billey Chang MD on Date: 02/19/22.  Chronic Condition 1: HYPERTENSION Provider Assessment and Plan  HTN: This medical condition is well controlled. There are no signs of complications, medication side effects, or red flags. Patient is instructed to continue the current treatment plan without change in therapies or medications.   Expected Outcome/Goals Addressed This Visit (Provider CCM goals/Provider Assessment and plan  CCM (HYPERTENSION) EXPECTED OUTCOME: MONITOR, SELF-MANAGE AND REDUCE SYMPTOMS OF HYPERTENSION  Symptom Management Condition 1: Take medications as prescribed   Attend all scheduled provider appointments Call pharmacy for medication refills 3-7 days in advance of running out of medications Perform all self care activities independently  Perform IADL's (shopping, preparing meals, housekeeping, managing finances) independently Call provider office for new concerns or questions  check blood pressure weekly choose a place to take my blood pressure (home, clinic or office, retail store) write blood pressure results in a log or diary learn about high blood pressure keep a blood pressure log take blood pressure log to all doctor appointments call doctor for signs and symptoms of high blood pressure keep all doctor appointments take medications for blood pressure exactly as prescribed report new symptoms to your doctor eat more whole grains, fruits and vegetables, lean meats and healthy fats Look over education sent via my chart- low sodium diet  Chronic Condition 2: DIABETES Provider Assessment and Plan Diabetes is currently adequately controlled. Goal a1c < 8 given age and comorbidities. No change in meds   Expected Outcome/Goals Addressed This Visit (Provider CCM  goals/Provider Assessment and plan  CCM (DIABETES) EXPECTED OUTCOME:  MONITOR, SELF-MANAGE AND REDUCE SYMPTOMS OF DIABETES  Symptom Management Condition 2: Take medications as prescribed   Attend all scheduled provider appointments Call pharmacy for medication refills 3-7 days in advance of running out of medications Perform all self care activities independently  Perform IADL's (shopping, preparing meals, housekeeping, managing finances) independently Call provider office for new concerns or questions  check blood sugar at prescribed times: once daily check feet daily for cuts, sores or redness enter blood sugar readings and medication or insulin into daily log take the blood sugar log to all doctor visits take the blood sugar meter to all doctor visits trim toenails straight across drink 6 to 8 glasses of water each day fill half of plate with vegetables limit fast food meals to no more than 1 per week manage portion size read food labels for fat, fiber, carbohydrates and portion size Try to exercise some daily- even if just walking for a few minutes at a time Look over education sent via my chart- hypoglycemia  Problem List Patient Active Problem List   Diagnosis Date Noted   Osteoarthritis of metacarpophalangeal (MCP) joint of left thumb 01/18/2022   Mild cognitive impairment 03/05/2021   Stable hemispheric central retinal vein occlusion (CRVO) of right eye 07/21/2020   Moderate nonproliferative diabetic retinopathy of left eye ([place]) 06/18/2020   Thoracic aortic aneurysm without rupture ([place]) 05/26/2020   Stage 3b chronic kidney disease ([place]) 05/26/2020   Aortic stenosis, moderate 05/22/2019   Chronic diastolic CHF (congestive heart failure) ([place]) 03/14/2019   Lower extremity edema 03/14/2019   Spinal stenosis of lumbar region 02/21/2018   Degeneration of lumbar intervertebral disc 02/21/2018   Diabetic peripheral neuropathy associated with type 2 diabetes mellitus ([place])  05/05/2017  Atherosclerotic heart disease of native coronary artery without angina pectoris 06/13/2016   CVA (cerebral vascular accident) ([place]) 06/06/2016   Recurrent coronary arteriosclerosis after percutaneous transluminal coronary angioplasty 04/18/2015   Epistaxis, recurrent 04/11/2015   CAD S/P PCI- Nov 2016    Chronic vertigo 03/15/2014   Current use of long term anticoagulation 02/06/2013   Obesity (BMI 30.0-34.9) 01/31/2012   Osteoarthritis, knee 01/31/2012   Allergic rhinitis 10/28/2011   Chronic atrial fibrillation ([place]) 07/30/2011   Hearing loss 06/08/2011   Primary osteoarthritis of hand 06/08/2011   Enlarged prostate without lower urinary tract symptoms (luts) 01/04/2011   Gastro-esophageal reflux disease without esophagitis 12/07/2010   Hx of CABG x 2 1990    Essential (primary) hypertension    Diabetes mellitus with diabetic nephropathy, with long-term current use of insulin (HCC)    Mixed hyperlipidemia    Benign hematuria 08/27/2008    Medication Management  Current Outpatient Medications:    apixaban (ELIQUIS) 5 MG TABS tablet, Take 1 tablet (5 mg total) by mouth 2 (two) times daily., Disp: 60 tablet, Rfl: 2   atorvastatin (LIPITOR) 40 MG tablet, Take 1 tablet (40 mg total) by mouth daily at 6 PM., Disp: 30 tablet, Rfl: 2   AVODART 0.5 MG capsule, Take 0.5 mg by mouth See admin instructions. Take 0.5 mg by mouth every other night- at bedtime, Disp: , Rfl:    azelastine (ASTELIN) 0.1 % nasal spray, Place 2 sprays into both nostrils 2 (two) times daily. Use in each nostril as directed, Disp: 30 mL, Rfl: 0   cetirizine (ZYRTEC) 10 MG tablet, Take 1 tablet (10 mg total) by mouth daily., Disp: 30 tablet, Rfl: 0   Cholecalciferol (VITAMIN D3 PO), Take 1 capsule by mouth daily., Disp: , Rfl:    diltiazem (TIAZAC) 360 MG 24 hr capsule, Take 360 mg by mouth daily., Disp: , Rfl:    empagliflozin (JARDIANCE) 25 MG TABS tablet, Take 1 tablet (25 mg total) by mouth daily before  breakfast., Disp: 30 tablet, Rfl:    furosemide (LASIX) 40 MG tablet, Take 1 tablet (40 mg total) by mouth daily., Disp: 90 tablet, Rfl: 3   gabapentin (NEURONTIN) 300 MG capsule, TAKE 1 CAPSULE(300 MG) BY MOUTH THREE TIMES DAILY, Disp: 90 capsule, Rfl: 3   insulin glargine (LANTUS) 100 UNIT/ML injection, Inject 40 Units into the skin every morning., Disp: , Rfl:    metFORMIN (GLUCOPHAGE) 500 MG tablet, Take 500 mg by mouth 2 (two) times daily with a meal., Disp: , Rfl:    metoprolol succinate (TOPROL-XL) 100 MG 24 hr tablet, Take 1 tablet (100 mg total) by mouth daily. Take with or immediately following a meal. (Patient taking differently: Take 100 mg by mouth in the morning.), Disp: 180 tablet, Rfl: 2   nitroGLYCERIN (NITROSTAT) 0.4 MG SL tablet, Place 0.4 mg under the tongue every 5 (five) minutes as needed for chest pain (x 3 doses)., Disp: , Rfl:    pantoprazole (PROTONIX) 40 MG tablet, TAKE 1 TABLET BY MOUTH DAILY AT NOON (Patient taking differently: Take 40 mg by mouth daily in the afternoon.), Disp: 90 tablet, Rfl: 3   tamsulosin (FLOMAX) 0.4 MG CAPS capsule, Take 0.4 mg by mouth every other day., Disp: , Rfl:    TYLENOL 500 MG tablet, Take 500-1,000 mg by mouth every 6 (six) hours as needed for mild pain or headache., Disp: , Rfl:    azithromycin (ZITHROMAX) 250 MG tablet, Take 2 tabs today, then 1 tab daily for 4  days (Patient not taking: Reported on 02/26/2022), Disp: 1 each, Rfl: 0   guaifenesin (HUMIBID E) 400 MG TABS tablet, TAKE ONE TABLET BY MOUTH THREE TIMES A DAY AS NEEDED FOR MUCUS (Patient not taking: Reported on 02/26/2022), Disp: , Rfl:    Multiple Vitamin (MULTIVITAMIN) tablet, Take 1 tablet by mouth daily with breakfast. (Patient not taking: Reported on 01/27/2022), Disp: , Rfl:    traMADol (ULTRAM) 50 MG tablet, Take 1 tablet (50 mg total) by mouth daily as needed (hand pain). (Patient not taking: Reported on 01/27/2022), Disp: 20 tablet, Rfl: 1  Cognitive Assessment Identity  Confirmed: : Name; DOB Cognitive Status: Normal   Functional Assessment Hearing Difficulty or Deaf: yes Hearing Management: getting new hearing aides from [place] next week Wear Glasses or Blind: yes Vision Management: can see well w/ glasses Concentrating, Remembering or Making Decisions Difficulty (CP): no Difficulty Communicating: no Difficulty Eating/Swallowing: no Walking or Climbing Stairs Difficulty: no Dressing/Bathing Difficulty: no Doing Errands Independently Difficulty (such as shopping) (CP): no   Caregiver Assessment  Primary Source of Support/Comfort: spouse Name of Support/Comfort Primary Source: [place] in Home: spouse Name(s) of People in Home: [name]   Planned Interventions  Provided education to patient about basic DM disease process; Reviewed medications with patient and discussed importance of medication adherence;        Reviewed prescribed diet with patient carbohydrate modified; Counseled on importance of regular laboratory monitoring as prescribed;        Discussed plans with patient for ongoing care management follow up and provided patient with direct contact information for care management team;      Provided patient with written educational materials related to hypo and hyperglycemia and importance of correct treatment;       Advised patient, providing education and rationale, to check cbg once daily and record        Review of patient status, including review of consultants reports, relevant laboratory and other test results, and medications completed;       Advised patient to discuss any issues with blood sugar (outside normal parameters)  with provider;      Screening for signs and symptoms of depression related to chronic disease state;        Assessed social determinant of health barriers;        Reviewed importance of exercise Pain assessment completed Evaluation of current treatment plan related to hypertension self management and  patient's adherence to plan as established by provider;   Reviewed prescribed diet low sodium Reviewed medications with patient and discussed importance of compliance;  Counseled on the importance of exercise goals with target of 150 minutes per week Discussed plans with patient for ongoing care management follow up and provided patient with direct contact information for care management team; Advised patient, providing education and rationale, to monitor blood pressure daily and record, calling PCP for findings outside established parameters;  Advised patient to discuss any issues with blood pressure, medications with provider; Provided education on prescribed diet low sodium;  Discussed complications of poorly controlled blood pressure such as heart disease, stroke, circulatory complications, vision complications, kidney impairment, sexual dysfunction;  Screening for signs and symptoms of depression related to chronic disease state;  Assessed social determinant of health barriers;  Reviewed safety precautions  Interaction and coordination with outside resources, practitioners, and providers See CCM Referral  Care Plan: Available in MyChart

## 2022-02-26 NOTE — Chronic Care Management (AMB) (Cosign Needed)
Chronic Care Management   CCM RN Visit Note  02/26/2022 Name: [name] MRN: [id number] DOB: [birth year]  Subjective: [name] is a 88 y.o. year old male who is a primary care patient of [name], MD. The patient was referred to the Chronic Care Management team for assistance with care management needs subsequent to provider initiation of CCM services and plan of care.    Today's Visit:  Engaged with patient by telephone for initial visit.     SDOH Interventions Today    Flowsheet Row Most Recent Value  SDOH Interventions   Food Insecurity Interventions Intervention Not Indicated  Housing Interventions Intervention Not Indicated  Transportation Interventions Intervention Not Indicated  Utilities Interventions Intervention Not Indicated  Financial Strain Interventions Intervention Not Indicated  Physical Activity Interventions Patient Refused  Stress Interventions Intervention Not Indicated  Social Connections Interventions Intervention Not Indicated, Patient Refused         Goals Addressed             This Visit's Progress    CCM (DIABETES) EXPECTED OUTCOME:  MONITOR, SELF-MANAGE AND REDUCE SYMPTOMS OF DIABETES       Current Barriers:  Knowledge Deficits related to Diabetes management Chronic Disease Management support and education needs related to Diabetes, diet No Advanced Directives in place- pt declines information Pt reports he checks CBG once daily with readings in 90-120's range, states drinks unsweetened beverages, does not always follow a special diet  Planned Interventions: Provided education to patient about basic DM disease process; Reviewed medications with patient and discussed importance of medication adherence;        Reviewed prescribed diet with patient carbohydrate modified; Counseled on importance of regular laboratory monitoring as prescribed;        Discussed plans with patient for ongoing care management follow up and provided  patient with direct contact information for care management team;      Provided patient with written educational materials related to hypo and hyperglycemia and importance of correct treatment;       Advised patient, providing education and rationale, to check cbg once daily and record        Review of patient status, including review of consultants reports, relevant laboratory and other test results, and medications completed;       Advised patient to discuss any issues with blood sugar (outside normal parameters)  with provider;      Screening for signs and symptoms of depression related to chronic disease state;        Assessed social determinant of health barriers;        Reviewed importance of exercise Pain assessment completed  Symptom Management: Take medications as prescribed   Attend all scheduled provider appointments Call pharmacy for medication refills 3-7 days in advance of running out of medications Perform all self care activities independently  Perform IADL's (shopping, preparing meals, housekeeping, managing finances) independently Call provider office for new concerns or questions  check blood sugar at prescribed times: once daily check feet daily for cuts, sores or redness enter blood sugar readings and medication or insulin into daily log take the blood sugar log to all doctor visits take the blood sugar meter to all doctor visits trim toenails straight across drink 6 to 8 glasses of water each day fill half of plate with vegetables limit fast food meals to no more than 1 per week manage portion size read food labels for fat, fiber, carbohydrates and portion size Try to exercise some daily-  even if just walking for a few minutes at a time Look over education sent via my chart- hypoglycemia  Follow Up Plan: Telephone follow up appointment with care management team member scheduled for:  04/28/22 at 53 am       CCM (HYPERTENSION) EXPECTED OUTCOME: MONITOR,  SELF-MANAGE AND REDUCE SYMPTOMS OF HYPERTENSION       Current Barriers:  Knowledge Deficits related to Hypertension management Chronic Disease Management support and education needs related to Hypertension, diet No Advanced Directives in place- pt declines Pt reports he lives with spouse [name], is independent with all aspects of his care, continues to drive Pt reports he has blood pressure cuff but does not monitor and states " it's usually good"  Planned Interventions: Evaluation of current treatment plan related to hypertension self management and patient's adherence to plan as established by provider;   Reviewed prescribed diet low sodium Reviewed medications with patient and discussed importance of compliance;  Counseled on the importance of exercise goals with target of 150 minutes per week Discussed plans with patient for ongoing care management follow up and provided patient with direct contact information for care management team; Advised patient, providing education and rationale, to monitor blood pressure daily and record, calling PCP for findings outside established parameters;  Advised patient to discuss any issues with blood pressure, medications with provider; Provided education on prescribed diet low sodium;  Discussed complications of poorly controlled blood pressure such as heart disease, stroke, circulatory complications, vision complications, kidney impairment, sexual dysfunction;  Screening for signs and symptoms of depression related to chronic disease state;  Assessed social determinant of health barriers;  Reviewed safety precautions  Symptom Management: Take medications as prescribed   Attend all scheduled provider appointments Call pharmacy for medication refills 3-7 days in advance of running out of medications Perform all self care activities independently  Perform IADL's (shopping, preparing meals, housekeeping, managing finances) independently Call  provider office for new concerns or questions  check blood pressure weekly choose a place to take my blood pressure (home, clinic or office, retail store) write blood pressure results in a log or diary learn about high blood pressure keep a blood pressure log take blood pressure log to all doctor appointments call doctor for signs and symptoms of high blood pressure keep all doctor appointments take medications for blood pressure exactly as prescribed report new symptoms to your doctor eat more whole grains, fruits and vegetables, lean meats and healthy fats Look over education sent via my chart- low sodium diet  Follow Up Plan: Telephone follow up appointment with care management team member scheduled for:  04/28/22 at 1045 am          Plan:Telephone follow up appointment with care management team member scheduled for:  04/28/22 at [place] am  Jacqlyn Larsen [facility], BSN RN Case Manager [place] at [employer] [phone]

## 2022-02-28 DIAGNOSIS — I10 Essential (primary) hypertension: Secondary | ICD-10-CM

## 2022-02-28 DIAGNOSIS — E1159 Type 2 diabetes mellitus with other circulatory complications: Secondary | ICD-10-CM | POA: Diagnosis not present

## 2022-02-28 DIAGNOSIS — Z794 Long term (current) use of insulin: Secondary | ICD-10-CM | POA: Diagnosis not present

## 2022-03-03 ENCOUNTER — Ambulatory Visit (INDEPENDENT_AMBULATORY_CARE_PROVIDER_SITE_OTHER): Payer: Medicare Other | Admitting: Podiatry

## 2022-03-03 ENCOUNTER — Encounter: Payer: Self-pay | Admitting: Podiatry

## 2022-03-03 DIAGNOSIS — M79674 Pain in right toe(s): Secondary | ICD-10-CM

## 2022-03-03 DIAGNOSIS — E1142 Type 2 diabetes mellitus with diabetic polyneuropathy: Secondary | ICD-10-CM | POA: Diagnosis not present

## 2022-03-03 DIAGNOSIS — D689 Coagulation defect, unspecified: Secondary | ICD-10-CM | POA: Diagnosis not present

## 2022-03-03 DIAGNOSIS — B351 Tinea unguium: Secondary | ICD-10-CM

## 2022-03-03 NOTE — Progress Notes (Signed)
This patient returns to my office for at risk foot care.  This patient requires this care by a professional since this patient will be at risk due to having diabetic neuropathy and coagulation defect.  Patient is taking eliquiss.  Patient says toenails are painful walking and wearing his shoes.  This patient presents for at risk foot care today.  General Appearance  Alert, conversant and in no acute stress.  Vascular  Dorsalis pedis and posterior tibial  pulses are weakly  palpable  bilaterally.  Capillary return is within normal limits  bilaterally. Temperature is within normal limits  bilaterally.  Neurologic  Senn-Weinstein monofilament wire test within normal limits  bilaterally. Muscle power within normal limits bilaterally.  Nails Thick disfigured discolored nails with subungual debris  from hallux to fifth toes bilaterally. No evidence of bacterial infection or drainage bilaterally.  Orthopedic  No limitations of motion  feet .  No crepitus or effusions noted.  No bony pathology or digital deformities noted.  Skin  normotropic skin with no porokeratosis noted bilaterally.  No signs of infections or ulcers noted.     Onychomycosis  B/L  Consent was obtained for treatment procedures.   Debridement of nails with nail nipper followed by dremel tool. Filed with dremel without incident.    Return office visit    12   weeks                Told patient to return for periodic foot care and evaluation due to potential at risk complications.   Gardiner Barefoot DPM

## 2022-03-07 DIAGNOSIS — I4891 Unspecified atrial fibrillation: Secondary | ICD-10-CM | POA: Diagnosis not present

## 2022-03-07 DIAGNOSIS — I959 Hypotension, unspecified: Secondary | ICD-10-CM | POA: Diagnosis not present

## 2022-03-08 ENCOUNTER — Telehealth: Payer: Medicare Other

## 2022-03-11 ENCOUNTER — Encounter: Payer: Self-pay | Admitting: Family Medicine

## 2022-03-11 ENCOUNTER — Ambulatory Visit (INDEPENDENT_AMBULATORY_CARE_PROVIDER_SITE_OTHER): Payer: Medicare Other | Admitting: Family Medicine

## 2022-03-11 ENCOUNTER — Ambulatory Visit (INDEPENDENT_AMBULATORY_CARE_PROVIDER_SITE_OTHER): Payer: Medicare Other

## 2022-03-11 VITALS — BP 104/72 | HR 92 | Temp 98.2°F | Wt 209.0 lb

## 2022-03-11 VITALS — BP 112/70 | HR 85 | Temp 98.5°F | Ht 66.0 in | Wt 209.0 lb

## 2022-03-11 DIAGNOSIS — E782 Mixed hyperlipidemia: Secondary | ICD-10-CM

## 2022-03-11 DIAGNOSIS — E1142 Type 2 diabetes mellitus with diabetic polyneuropathy: Secondary | ICD-10-CM

## 2022-03-11 DIAGNOSIS — E1169 Type 2 diabetes mellitus with other specified complication: Secondary | ICD-10-CM

## 2022-03-11 DIAGNOSIS — I152 Hypertension secondary to endocrine disorders: Secondary | ICD-10-CM | POA: Diagnosis not present

## 2022-03-11 DIAGNOSIS — E1159 Type 2 diabetes mellitus with other circulatory complications: Secondary | ICD-10-CM | POA: Diagnosis not present

## 2022-03-11 DIAGNOSIS — N1832 Chronic kidney disease, stage 3b: Secondary | ICD-10-CM

## 2022-03-11 DIAGNOSIS — I5032 Chronic diastolic (congestive) heart failure: Secondary | ICD-10-CM | POA: Diagnosis not present

## 2022-03-11 DIAGNOSIS — Z0000 Encounter for general adult medical examination without abnormal findings: Secondary | ICD-10-CM | POA: Diagnosis not present

## 2022-03-11 DIAGNOSIS — Z9861 Coronary angioplasty status: Secondary | ICD-10-CM

## 2022-03-11 DIAGNOSIS — I2510 Atherosclerotic heart disease of native coronary artery without angina pectoris: Secondary | ICD-10-CM | POA: Diagnosis not present

## 2022-03-11 DIAGNOSIS — Z794 Long term (current) use of insulin: Secondary | ICD-10-CM

## 2022-03-11 DIAGNOSIS — E1121 Type 2 diabetes mellitus with diabetic nephropathy: Secondary | ICD-10-CM

## 2022-03-11 DIAGNOSIS — E113392 Type 2 diabetes mellitus with moderate nonproliferative diabetic retinopathy without macular edema, left eye: Secondary | ICD-10-CM | POA: Diagnosis not present

## 2022-03-11 DIAGNOSIS — I482 Chronic atrial fibrillation, unspecified: Secondary | ICD-10-CM

## 2022-03-11 LAB — CBC WITH DIFFERENTIAL/PLATELET
Basophils Absolute: 0.2 10*3/uL — ABNORMAL HIGH (ref 0.0–0.1)
Basophils Relative: 2.5 % (ref 0.0–3.0)
Eosinophils Absolute: 0.3 10*3/uL (ref 0.0–0.7)
Eosinophils Relative: 3.9 % (ref 0.0–5.0)
HCT: 35.1 % — ABNORMAL LOW (ref 39.0–52.0)
Hemoglobin: 11 g/dL — ABNORMAL LOW (ref 13.0–17.0)
Lymphocytes Relative: 19.5 % (ref 12.0–46.0)
Lymphs Abs: 1.7 10*3/uL (ref 0.7–4.0)
MCHC: 31.2 g/dL (ref 30.0–36.0)
MCV: 71.1 fl — ABNORMAL LOW (ref 78.0–100.0)
Monocytes Absolute: 0.6 10*3/uL (ref 0.1–1.0)
Monocytes Relative: 7.3 % (ref 3.0–12.0)
Neutro Abs: 5.9 10*3/uL (ref 1.4–7.7)
Neutrophils Relative %: 66.8 % (ref 43.0–77.0)
Platelets: 312 10*3/uL (ref 150.0–400.0)
RBC: 4.94 Mil/uL (ref 4.22–5.81)
RDW: 18.2 % — ABNORMAL HIGH (ref 11.5–15.5)
WBC: 8.8 10*3/uL (ref 4.0–10.5)

## 2022-03-11 LAB — COMPREHENSIVE METABOLIC PANEL
ALT: 9 U/L (ref 0–53)
AST: 12 U/L (ref 0–37)
Albumin: 4.3 g/dL (ref 3.5–5.2)
Alkaline Phosphatase: 69 U/L (ref 39–117)
BUN: 23 mg/dL (ref 6–23)
CO2: 30 mEq/L (ref 19–32)
Calcium: 9.8 mg/dL (ref 8.4–10.5)
Chloride: 104 mEq/L (ref 96–112)
Creatinine, Ser: 1.37 mg/dL (ref 0.40–1.50)
GFR: 45 mL/min — ABNORMAL LOW (ref >60.00)
Glucose, Bld: 121 mg/dL — ABNORMAL HIGH (ref 70–99)
Potassium: 4.6 mEq/L (ref 3.5–5.1)
Sodium: 144 mEq/L (ref 135–145)
Total Bilirubin: 0.8 mg/dL (ref 0.2–1.2)
Total Protein: 6.9 g/dL (ref 6.0–8.3)

## 2022-03-11 LAB — LIPID PANEL
Cholesterol: 147 mg/dL (ref 0–200)
HDL: 52.3 mg/dL (ref >39.00)
LDL Cholesterol: 80 mg/dL (ref 0–99)
NonHDL: 94.6
Total CHOL/HDL Ratio: 3
Triglycerides: 75 mg/dL (ref 0.0–149.0)
VLDL: 15 mg/dL (ref 0.0–40.0)

## 2022-03-11 LAB — TSH: TSH: 1.81 u[IU]/mL (ref 0.35–5.50)

## 2022-03-11 LAB — HEMOGLOBIN A1C: Hgb A1c MFr Bld: 8.7 % — ABNORMAL HIGH (ref 4.6–6.5)

## 2022-03-11 MED ORDER — GABAPENTIN 300 MG PO CAPS
600.0000 mg | ORAL_CAPSULE | Freq: Three times a day (TID) | ORAL | 5 refills | Status: DC
Start: 2022-03-11 — End: 2022-08-16

## 2022-03-11 NOTE — Progress Notes (Signed)
Please call patient: I have reviewed his/her lab results. His diabetic control has worsened: A1c is 8.7 and we want it to be < 8.0. please ask him to watch his diet and check his sugars again and bring them to his next appointment. I will adjust his medication if it is not improving with diet changes . Other labs are stable

## 2022-03-11 NOTE — Progress Notes (Signed)
Subjective:   [name] is a 89 y.o. male who presents for Medicare Annual/Subsequent preventive examination.  Review of Systems     Cardiac Risk Factors include: advanced age (>68mn, >>11women);hypertension;diabetes mellitus;dyslipidemia;male gender;obesity (BMI >30kg/m2)     Objective:    Today's Vitals   03/11/22 0919  BP: 104/72  Pulse: 92  Temp: 98.2 F (36.8 C)  SpO2: 99%  Weight: 209 lb (94.8 kg)   Body mass index is 33.73 kg/m.     03/11/2022    9:30 AM 02/26/2022   11:46 AM 12/05/2021    9:16 AM 10/10/2021    6:00 PM 10/10/2021    8:21 AM 10/04/2021    3:00 PM 09/29/2021    7:59 AM  Advanced Directives  Does Patient Have a Medical Advance Directive? Yes No No  Yes Yes No  Type of [place]of [place] will   Healthcare Power of [place]entral AguirreLiving will [place] will   Does patient want to make changes to medical advance directive?    No - Patient declined  No - Patient declined   Copy of [place] Chart? No - copy requested     No - copy requested   Would patient like information on creating a medical advance directive?  No - Patient declined No - Patient declined        Current Medications (verified) Outpatient Encounter Medications as of 03/11/2022  Medication Sig   apixaban (ELIQUIS) 5 MG TABS tablet Take 1 tablet (5 mg total) by mouth 2 (two) times daily.   atorvastatin (LIPITOR) 40 MG tablet Take 1 tablet (40 mg total) by mouth daily at 6 PM.   AVODART 0.5 MG capsule Take 0.5 mg by mouth See admin instructions. Take 0.5 mg by mouth every other night- at bedtime   cetirizine (ZYRTEC) 10 MG tablet Take 1 tablet (10 mg total) by mouth daily.   Cholecalciferol (VITAMIN D3 PO) Take 1 capsule by mouth daily.   diltiazem (TIAZAC) 360 MG 24 hr capsule Take 360 mg by mouth daily.   empagliflozin (JARDIANCE) 25 MG TABS tablet Take 1 tablet (25 mg total) by  mouth daily before breakfast.   furosemide (LASIX) 40 MG tablet Take 1 tablet (40 mg total) by mouth daily.   gabapentin (NEURONTIN) 300 MG capsule TAKE 1 CAPSULE(300 MG) BY MOUTH THREE TIMES DAILY   insulin glargine (LANTUS) 100 UNIT/ML injection Inject 40 Units into the skin every morning.   metFORMIN (GLUCOPHAGE) 500 MG tablet Take 500 mg by mouth 2 (two) times daily with a meal.   metoprolol succinate (TOPROL-XL) 100 MG 24 hr tablet Take 1 tablet (100 mg total) by mouth daily. Take with or immediately following a meal. (Patient taking differently: Take 100 mg by mouth in the morning.)   pantoprazole (PROTONIX) 40 MG tablet TAKE 1 TABLET BY MOUTH DAILY AT NOON (Patient taking differently: Take 40 mg by mouth daily in the afternoon.)   tamsulosin (FLOMAX) 0.4 MG CAPS capsule Take 0.4 mg by mouth every other day.   TYLENOL 500 MG tablet Take 500-1,000 mg by mouth every 6 (six) hours as needed for mild pain or headache.   azelastine (ASTELIN) 0.1 % nasal spray Place 2 sprays into both nostrils 2 (two) times daily. Use in each nostril as directed (Patient not taking: Reported on 03/11/2022)   guaifenesin (HUMIBID E) 400 MG TABS tablet TAKE ONE TABLET BY MOUTH THREE TIMES A DAY  AS NEEDED FOR MUCUS (Patient not taking: Reported on 02/26/2022)   Multiple Vitamin (MULTIVITAMIN) tablet Take 1 tablet by mouth daily with breakfast. (Patient not taking: Reported on 01/27/2022)   nitroGLYCERIN (NITROSTAT) 0.4 MG SL tablet Place 0.4 mg under the tongue every 5 (five) minutes as needed for chest pain (x 3 doses). (Patient not taking: Reported on 03/11/2022)   traMADol (ULTRAM) 50 MG tablet Take 1 tablet (50 mg total) by mouth daily as needed (hand pain). (Patient not taking: Reported on 03/11/2022)   [DISCONTINUED] azithromycin (ZITHROMAX) 250 MG tablet Take 2 tabs today, then 1 tab daily for 4 days (Patient not taking: Reported on 02/26/2022)   No facility-administered encounter medications on file as of  03/11/2022.    Allergies (verified) Septra [sulfamethoxazole-trimethoprim], Cefadroxil, Ciprofloxacin, and Erythromycin   History: Past Medical History:  Diagnosis Date   Abdominal pain    Acute renal failure ([place])     resolved   Arthritis    "hands & legs" (11/'10/2014)   Ataxia    Atrial fibrillation (HCC)    Bladder outlet obstruction    Bladder outlet obstruction    BPH (benign prostatic hyperplasia)    CAD (coronary artery disease)    a. CABG IN 1989. b. 01/08/2015 CTO of ost LAD, LIMA to LAD not visualized but assumed patent given myoview finding, occluded SVG to diagonal, 99% mid RCA tx w/ SYNERGY DES 3X28 mm   Constipation    Diabetes mellitus type 2, insulin dependent ([place])    Epistaxis, recurrent Feb 2017   GERD (gastroesophageal reflux disease)    HTN (hypertension)    Hx of bacterial pneumonia    Hyperlipemia    Kidney stones    Mild aortic stenosis    Mild cognitive impairment 03/05/2021   MMSE 26/30 and 6CIT 9 03/2021 AWV   Pneumonia 04/2019   Rib fractures    left rib fractures being treated with pain medications   Stented coronary artery Nov 2016   RCA DES   Urinary tract infection     Enterococcus   Past Surgical History:  Procedure Laterality Date   [place] N/A 01/08/2015   Procedure: Left Heart Cath and Cors/Grafts Angiography;  Surgeon: Peter M Martinique, MD;  Location: [facility];  Service: Cardiovascular;  Laterality: N/A;   CARDIAC CATHETERIZATION  01/08/2015   Procedure: Coronary Stent Intervention;  Surgeon: Peter M Martinique, MD;  Location: [facility];  Service: Cardiovascular;;   CARPAL TUNNEL RELEASE Right 08/2010   CATARACT EXTRACTION W/ INTRAOCULAR LENS  IMPLANT, BILATERAL Bilateral    CORONARY ANGIOPLASTY  01/08/15   RCA DES   CORONARY ARTERY BYPASS GRAFT  1989   "CABG X 2"   IR THORACENTESIS ASP PLEURAL SPACE W/IMG GUIDE  05/14/2019   JOINT REPLACEMENT     TONSILLECTOMY     TOTAL HIP  ARTHROPLASTY Right 2000   Family History  Problem Relation Age of Onset   Brain cancer Father    Throat cancer Brother    Social History   Socioeconomic History   Marital status: Married    Spouse name: Not on file   Number of children: 3   Years of education: Not on file   Highest education level: Not on file  Occupational History   Occupation: Agricultural consultant  Tobacco Use   Smoking status: Former    Packs/day: 3.00    Years: 10.00    Total pack years: 30.00  Types: Cigarettes    Quit date: 05/11/1958    Years since quitting: 63.8   Smokeless tobacco: Never  Vaping Use   Vaping Use: Never used  Substance and Sexual Activity   Alcohol use: No   Drug use: No   Sexual activity: Not Currently  Other Topics Concern   Not on file  Social History Narrative   Previously lived in [place], [place] currently living in [place], [place]    Social Determinants of Health   Financial Resource Strain: Low Risk  (03/11/2022)   Overall Financial Resource Strain (CARDIA)    Difficulty of Paying Living Expenses: Not hard at all  Food Insecurity: No Food Insecurity (03/11/2022)   Hunger Vital Sign    Worried About Running Out of Food in the Last Year: Never true    Ran Out of Food in the Last Year: Never true  Transportation Needs: No Transportation Needs (03/11/2022)   PRAPARE - Hydrologist (Medical): No    Lack of Transportation (Non-Medical): No  Physical Activity: Inactive (03/11/2022)   Exercise Vital Sign    Days of Exercise per Week: 0 days    Minutes of Exercise per Session: 0 min  Stress: No Stress Concern Present (03/11/2022)   [place]    Feeling of Stress : Not at all  Social Connections: Moderately Isolated (03/11/2022)   Social Connection and Isolation Panel [NHANES]    Frequency of Communication with Friends and Family: More than three times a week    Frequency of  Social Gatherings with Friends and Family: More than three times a week    Attends Religious Services: Never    Marine scientist or Organizations: No    Attends Music therapist: Never    Marital Status: Married    Tobacco Counseling Counseling given: Not Answered   Clinical Intake:  Pre-visit preparation completed: Yes  Pain : No/denies pain     BMI - recorded: 33.73 Nutritional Status: BMI > 30  Obese Nutritional Risks: None Diabetes: Yes CBG done?: Yes (142 per pt) CBG resulted in Enter/ Edit results?: No Did pt. bring in CBG monitor from home?: No  How often do you need to have someone help you when you read instructions, pamphlets, or other written materials from your doctor or pharmacy?: 1 - Never  Diabetic?Nutrition Risk Assessment:  Has the patient had any N/V/D within the last 2 months?  No  Does the patient have any non-healing wounds?  No  Has the patient had any unintentional weight loss or weight gain?  No   Diabetes:  Is the patient diabetic?  Yes  If diabetic, was a CBG obtained today?  Yes  Did the patient bring in their glucometer from home?  No  How often do you monitor your CBG's? daily.   Financial Strains and Diabetes Management:  Are you having any financial strains with the device, your supplies or your medication? No .  Does the patient want to be seen by Chronic Care Management for management of their diabetes?  No  Would the patient like to be referred to a Nutritionist or for Diabetic Management?  No   Diabetic Exams:  Diabetic Eye Exam: Completed 05/06/21 Diabetic Foot Exam: Completed 08/03/21   Interpreter Needed?: No  Information entered by :: Charlott Rakes, LPN   Activities of Daily Living    03/11/2022    9:32 AM 10/10/2021  5:00 PM  In your present state of health, do you have any difficulty performing the following activities:  Hearing? 1 0  Vision? 0 0  Difficulty concentrating or making decisions? 0  0  Walking or climbing stairs? 0 1  Comment avoid stairs   Dressing or bathing? 0 1  Doing errands, shopping? 0 1  Preparing Food and eating ? N   Using the Toilet? N   In the past six months, have you accidently leaked urine? N   Do you have problems with loss of bowel control? N   Managing your Medications? N   Managing your Finances? N   Housekeeping or managing your Housekeeping? N     Patient Care Team: [name], MD as PCP - General (Family Medicine) Martinique, Peter M, MD as PCP - Cardiology (Cardiology) Martinique, Peter M, MD as Consulting Physician (Cardiology) Gardiner Barefoot, DPM as Consulting Physician (Podiatry) [name], Jessy Oto, MD (Inactive) as Consulting Physician (Vascular Surgery) Melida Quitter, MD as Consulting Physician (Otolaryngology) Zadie Rhine Clent Demark, MD as Consulting Physician (Ophthalmology) [place] as Consulting Physician ([place]) Ewing, [place] (Neurosurgery) Edythe Clarity, [facility] (Pharmacist) Leandrew Koyanagi, MD as Consulting Physician (Orthopedic Surgery) Tobi Bastos, RN as [place] any recent [place] you may have received from other than [facility] providers in the past year (date may be approximate).     Assessment:   This is a routine wellness examination for [employer].  Hearing/Vision screen Hearing Screening - Comments:: Pt wears hearing aids  Vision Screening - Comments:: Pt follows up with Dr Zadie Rhine and Dr Katy Fitch for annual eye exams   Dietary issues and exercise activities discussed: Current Exercise Habits: The patient does not participate in regular exercise at present   Goals Addressed             This Visit's Progress    Patient Stated       Stay alive and healthy       Depression Screen    03/11/2022    9:29 AM 02/26/2022   11:39 AM 12/15/2021   10:46 AM 12/10/2021   10:36 AM 12/07/2021    9:20 AM 11/11/2021    9:43 AM  10/19/2021   10:46 AM  PHQ 2/9 Scores  PHQ - 2 Score 0 0 0 0 0 0 0    Fall Risk    03/11/2022    9:31 AM 02/26/2022   11:41 AM 12/15/2021   10:46 AM 12/10/2021   10:36 AM 12/07/2021    9:20 AM  Fall Risk   Falls in the past year? 1 1 0 0 0  Number falls in past yr: 1 0 0 0 0  Injury with Fall? 0 0 0 0 0  Risk for fall due to : Impaired vision;Impaired balance/gait  No Fall Risks No Fall Risks No Fall Risks  Follow up Falls prevention discussed  Falls evaluation completed Falls evaluation completed Falls evaluation completed    [place]:  Any stairs in or around the home? Yes  If so, are there any without handrails? No  Home free of loose throw rugs in walkways, pet beds, electrical cords, etc? Yes  Adequate lighting in your home to reduce risk of falls? Yes   ASSISTIVE DEVICES UTILIZED TO PREVENT FALLS:  Life alert? No  Use of a cane, walker or w/c? Yes  Grab bars in the bathroom? Yes  Shower chair or bench in shower? Yes  Elevated toilet seat or a handicapped toilet? Yes   TIMED UP AND GO:  Was the test performed? Yes .  Length of time to ambulate 10 feet: 15 sec.   Gait steady and fast without use of assistive device  Cognitive Function:    06/08/2017   10:30 AM  MMSE - Mini Mental State Exam  Orientation to time 5  Orientation to Place 5  Registration 3  Attention/ Calculation 3  Recall 1  Language- name 2 objects 2  Language- repeat 1  Language- follow 3 step command 3  Language- read & follow direction 1  Write a sentence 1  Copy design 1  Total score 26        03/11/2022    9:34 AM 03/05/2021    9:12 AM 02/15/2020    1:52 PM  6CIT Screen  What Year? 0 points 0 points 0 points  What month? 0 points 0 points 0 points  What time? 0 points 3 points   Count back from 20 0 points 0 points 0 points  Months in reverse 2 points 0 points 0 points  Repeat phrase 2 points 6 points 4 points  Total Score 4 points 9 points      Immunizations Immunization History  Administered Date(s) Administered   Fluad Quad(high Dose 65+) 11/02/2018, 11/30/2019, 11/07/2020, 11/11/2021   Influenza Split 11/14/2007, 01/01/2009, 12/23/2009, 11/29/2012, 10/31/2015   Influenza, High Dose Seasonal PF 11/02/2012, 12/12/2013, 11/15/2014, 11/24/2017, 01/17/2018   Influenza, Seasonal, Injecte, Preservative Fre 11/18/2015   Influenza,inj,Quad PF,6+ Mos 02/15/2017   Influenza,trivalent, recombinat, inj, PF 12/07/2010   Influenza-Unspecified 10/28/2011, 11/30/2014, 02/15/2017   PFIZER Comirnaty(Gray Top)Covid-19 Tri-Sucrose Vaccine 06/21/2020   PFIZER(Purple Top)SARS-COV-2 Vaccination 03/23/2019, 04/14/2019, 12/06/2019, 06/21/2020   PNEUMOCOCCAL CONJUGATE-20 08/06/2020   Pfizer Covid-19 Vaccine Bivalent Booster 29yr & up 01/02/2021   Pneumococcal Conjugate-13 12/13/2013   Pneumococcal Polysaccharide-23 08/20/2008   Td 05/29/2002   Tdap 10/28/2011, 02/15/2017   Zoster Recombinat (Shingrix) 09/16/2016, 10/13/2016, 12/13/2016, 01/11/2017   Zoster, Live 08/20/2008    TDAP status: Up to date  Flu Vaccine status: Up to date  Pneumococcal vaccine status: Up to date  Covid-19 vaccine status: Completed vaccines  Qualifies for Shingles Vaccine? Yes   Zostavax completed Yes   Shingrix Completed?: Yes  Screening Tests Health Maintenance  Topic Date Due   COVID-19 Vaccine (7 - 2023-24 season) 10/30/2021   OPHTHALMOLOGY EXAM  05/07/2022   HEMOGLOBIN A1C  05/12/2022   FOOT EXAM  08/04/2022   Medicare Annual Wellness (AWV)  03/12/2023   DTaP/Tdap/Td (4 - Td or Tdap) 02/16/2027   Pneumonia Vaccine 89 Years old  Completed   INFLUENZA VACCINE  Completed   Zoster Vaccines- Shingrix  Completed   HPV VACCINES  Aged Out    Health Maintenance  Health Maintenance Due  Topic Date Due   COVID-19 Vaccine (7 - 2023-24 season) 10/30/2021    Colorectal cancer screening: No longer required.    Additional Screening:  Vision  Screening: Recommended annual ophthalmology exams for early detection of glaucoma and other disorders of the eye. Is the patient up to date with their annual eye exam?  Yes  Who is the provider or what is the name of the office in which the patient attends annual eye exams? Dr GKaty Fitch If pt is not established with a provider, would they like to be referred to a provider to establish care? No .   Dental Screening: Recommended annual dental exams  for proper oral hygiene  Community Resource Referral / Chronic Care Management: CRR required this visit?  No   CCM required this visit?  No      Plan:     I have personally reviewed and noted the following in the patient's chart:   Medical and social history Use of alcohol, tobacco or illicit drugs  Current medications and supplements including opioid prescriptions. Patient is not currently taking opioid prescriptions. Functional ability and status Nutritional status Physical activity Advanced directives List of other physicians Hospitalizations, surgeries, and ER visits in previous 12 months Vitals Screenings to include cognitive, depression, and falls Referrals and appointments  In addition, I have reviewed and discussed with patient certain preventive protocols, quality metrics, and best practice recommendations. A written personalized care plan for preventive services as well as general preventive health recommendations were provided to patient.     Willette Brace, LPN   8/36/[id number]   Nurse Notes: none

## 2022-03-11 NOTE — Progress Notes (Signed)
Care Management & Coordination Services Pharmacy Note  03/16/2022 Name:  [name] MRN:  [id number] DOB:  May 23, 1928  Summary: PharmD FU visit.  A1c elevated - patient aware and reports fasting glucose has been 90-110.  Well controlled based off of those readings and no hypoglycemia.  Goal < 8% based on age.  He continues to work on [employer].  Recommendations/Changes made from today's visit: No changes - continue to monitor glucose and avoid hypo  Follow up plan: FU 6 months CMA to check glucose in 3 months   Subjective: [name] is an 89 y.o. year old male who is a primary patient of [name], MD.  The care coordination team was consulted for assistance with disease management and care coordination needs.    Engaged with patient by telephone for follow up visit.   Recent office visits:  12/15/2021 OV (PCP) [name], MD; will treat with zpak and monitor for one-two more weeks.   12/10/2021 OV (PCP) [name], MD; Recommend Zyrtec nightly and Astelin.  He may stop the albuterol and Mucinex    12/07/2021 OV (PCP) [name], MD;  We will wean off albuterol over the next 1 to 2 weeks.    10/14/2021 OV (PCP) [name], MD; no medication changes noted.   09/30/2021 OV (Fam Med) Loralee Pacas, MD;  I agreed to a few days of percocet to manage the pain since the lymphadenitis seems to be improving and he just needs pain mgmt. Also I had him see his pcp in a week or 2 to make sure the augmentin resolves the large lymph node problem- might need extended course.    Recent consult visits:  11/27/2021 OV (Podiatry) Gardiner Barefoot, DPM; no medication changes noted.   10/22/2021 OV (Cardiology) Martinique, Peter M, MD; no medication changes noted.   08/19/2021 OV (Podiatry) Gardiner Barefoot, DPM; no medication changes noted.   Hospital visits:  12/05/2021 ED visit for Wheezing  We will give him the albuterol and he will follow-up with his PCP.     10/10/2021 ED to Hospital Admission due to Community acquired pneumonia Admit date: 10/10/2021 Discharge date: 10/12/2021 -Added PRN second dose of lasix to take for fluid/edema     10/04/2021 ED to Hospital Admission due to Sepsis Admit date: 10/04/2021 Discharge date: 10/07/2021 He was started empirically on Rocephin and azithromycin he defervesced leukocytosis improved. He was transition to oral antibiotics with and he will finish 7-day as an outpatient.    09/29/2021 ED visit for Lymphadenitis -Rx Augmentin 875-125 mg every 12 houra -Rx Lidocaine 5%   Objective:  Lab Results  Component Value Date   CREATININE 1.37 03/11/2022   BUN 23 03/11/2022   GFR 45.00 (L) 03/11/2022   EGFR 54 (L) 10/30/2021   GFRNONAA 57 (L) 12/05/2021   GFRAA 57 (L) 01/17/2020   NA 144 03/11/2022   K 4.6 03/11/2022   CALCIUM 9.8 03/11/2022   CO2 30 03/11/2022   GLUCOSE 121 (H) 03/11/2022    Lab Results  Component Value Date/Time   HGBA1C 8.7 (H) 03/11/2022 10:54 AM   HGBA1C 7.7 (A) 11/11/2021 10:06 AM   HGBA1C 7.6 (A) 05/11/2021 10:27 AM   HGBA1C 8.1 (H) 05/26/2020 10:11 AM   HGBA1C 8.4 11/21/2019 12:00 AM   HGBA1C 9.0 02/09/2018 12:00 AM   GFR 45.00 (L) 03/11/2022 10:54 AM   GFR 46.93 (L) 04/14/2021 12:00 PM   MICROALBUR <0.7 03/14/2019 08:37 AM   MICROALBUR 3.5 (  H) 08/01/2017 09:40 AM    Last diabetic Eye exam:  Lab Results  Component Value Date/Time   HMDIABEYEEXA No Retinopathy 10/11/2018 12:00 AM    Last diabetic Foot exam: No results found for: "HMDIABFOOTEX"   Lab Results  Component Value Date   CHOL 147 03/11/2022   HDL 52.30 03/11/2022   LDLCALC 80 03/11/2022   TRIG 75.0 03/11/2022   CHOLHDL 3 03/11/2022       Latest Ref Rng & Units 03/11/2022   10:54 AM 12/05/2021   12:32 PM 10/10/2021    9:44 AM  Hepatic Function  Total Protein 6.0 - 8.3 g/dL 6.9  7.8  6.2   Albumin 3.5 - 5.2 g/dL 4.3  4.5  2.8   AST 0 - 37 U/L 12  14  32   ALT 0 - 53 U/L 9  9  33   Alk Phosphatase 39  - 117 U/L 69  65  68   Total Bilirubin 0.2 - 1.2 mg/dL 0.8  0.8  1.0     Lab Results  Component Value Date/Time   TSH 1.81 03/11/2022 10:54 AM   TSH 2.06 11/21/2019 12:00 AM   TSH 2.22 08/13/2019 11:54 AM       Latest Ref Rng & Units 03/11/2022   10:54 AM 12/05/2021   12:32 PM 10/22/2021   10:24 AM  CBC  WBC 4.0 - 10.5 K/uL 8.8  8.5  10.3   Hemoglobin 13.0 - 17.0 g/dL 11.0  11.0  11.0   Hematocrit 39.0 - 52.0 % 35.1  36.0  36.9   Platelets 150.0 - 400.0 K/uL 312.0  254  605     Lab Results  Component Value Date/Time   VD25OH 39.69 11/21/2019 12:00 AM   VITAMINB12 414 10/06/2021 04:25 AM   VITAMINB12 470 05/26/2020 10:11 AM    Clinical ASCVD: Yes  The ASCVD Risk score (Arnett DK, et al., 2019) failed to calculate for the following reasons:   The 2019 ASCVD risk score is only valid for ages 40 to 41   The patient has a prior MI or stroke diagnosis        03/11/2022    9:29 AM 02/26/2022   11:39 AM 12/15/2021   10:46 AM  Depression screen PHQ 2/9  Decreased Interest 0 0 0  Down, Depressed, Hopeless 0 0 0  PHQ - 2 Score 0 0 0     Social History   Tobacco Use  Smoking Status Former   Packs/day: 3.00   Years: 10.00   Total pack years: 30.00   Types: Cigarettes   Quit date: 05/11/1958   Years since quitting: 63.8  Smokeless Tobacco Never   BP Readings from Last 3 Encounters:  03/11/22 112/70  03/11/22 104/72  01/27/22 132/76   Pulse Readings from Last 3 Encounters:  03/11/22 85  03/11/22 92  01/27/22 93   Wt Readings from Last 3 Encounters:  03/11/22 209 lb (94.8 kg)  03/11/22 209 lb (94.8 kg)  01/27/22 209 lb 9.6 oz (95.1 kg)   BMI Readings from Last 3 Encounters:  03/11/22 33.73 kg/m  03/11/22 33.73 kg/m  01/27/22 33.83 kg/m    Allergies  Allergen Reactions   Septra [Sulfamethoxazole-Trimethoprim] Itching   Cefadroxil Other (See Comments)    Unknown reaction    Ciprofloxacin Other (See Comments)    Unknown action     Erythromycin Other  (See Comments)    Upsets the stomach     Medications Reviewed Today  Reviewed by Casandra Doffing, CMA (Certified Medical Assistant) on 03/11/22 at [facility] List Status: <None>   Medication Order Taking? Sig Documenting Provider Last Dose Status Informant  apixaban (ELIQUIS) 5 MG TABS tablet [id number] Yes Take 1 tablet (5 mg total) by mouth 2 (two) times daily. Reyne Dumas, MD Taking Active Multiple Informants           Med Note Dessie Coma   EGB Oct 10, 2021  3:51 PM) I do not see any recent fills for this, but the patient's wife and daughter assured me he IS taking it.  atorvastatin (LIPITOR) 40 MG tablet [id number] Yes Take 1 tablet (40 mg total) by mouth daily at 6 PM. Reyne Dumas, MD Taking Active Multiple Informants  AVODART 0.5 MG capsule [id number] Yes Take 0.5 mg by mouth See admin instructions. Take 0.5 mg by mouth every other night- at bedtime [provider] Taking Active Multiple Informants  azelastine (ASTELIN) 0.1 % nasal spray [id number] No Place 2 sprays into both nostrils 2 (two) times daily. Use in each nostril as directed  Patient not taking: Reported on 03/11/2022   [name], MD Not Taking Active   cetirizine (ZYRTEC) 10 MG tablet [id number] Yes Take 1 tablet (10 mg total) by mouth daily. [name], MD Taking Active   Cholecalciferol (VITAMIN D3 PO) [id number] Yes Take 1 capsule by mouth daily. [provider] Taking Active Multiple Informants  diltiazem (TIAZAC) 360 MG 24 hr capsule [id number] Yes Take 360 mg by mouth daily. [provider] Taking Active Multiple Informants  empagliflozin (JARDIANCE) 25 MG TABS tablet [id number] Yes Take 1 tablet (25 mg total) by mouth daily before breakfast. Martinique, Peter M, MD Taking Active Multiple Informants  furosemide (LASIX) 40 MG tablet [id number] Yes Take 1 tablet (40 mg total) by mouth daily. Martinique, Peter M, MD Taking Active   gabapentin (NEURONTIN) 300 MG capsule [id number] Yes TAKE 1  CAPSULE(300 MG) BY MOUTH THREE TIMES DAILY [name], MD Taking Active   guaifenesin (HUMIBID E) 400 MG TABS tablet [id number] No TAKE ONE TABLET BY MOUTH THREE TIMES A DAY AS NEEDED FOR MUCUS  Patient not taking: Reported on 02/26/2022   [provider] Not Taking Active   insulin glargine (LANTUS) 100 UNIT/ML injection [id number] Yes Inject 40 Units into the skin every morning. [provider] Taking Active Multiple Informants  metFORMIN (GLUCOPHAGE) 500 MG tablet [id number] Yes Take 500 mg by mouth 2 (two) times daily with a meal. [provider] Taking Active Multiple Informants  metoprolol succinate (TOPROL-XL) 100 MG 24 hr tablet [id number] Yes Take 1 tablet (100 mg total) by mouth daily. Take with or immediately following a meal.  Patient taking differently: Take 100 mg by mouth in the morning.   [place], Peter M, MD Taking Active Multiple Informants  Multiple Vitamin (MULTIVITAMIN) tablet [id number] No Take 1 tablet by mouth daily with breakfast.  Patient not taking: Reported on 01/27/2022   [provider] Not Taking Active Multiple Informants  nitroGLYCERIN (NITROSTAT) 0.4 MG SL tablet [id number] No Place 0.4 mg under the tongue every 5 (five) minutes as needed for chest pain (x 3 doses).  Patient not taking: Reported on 03/11/2022   [provider] Not Taking Active Multiple Informants           Med Note Dessie Coma   Sat Oct 10, 2021  3:44 PM)    pantoprazole (PROTONIX) 40 MG tablet [id number] Yes TAKE 1 TABLET  BY MOUTH DAILY AT NOON  Patient taking differently: Take 40 mg by mouth daily in the afternoon.   [place], Peter M, MD Taking Active Multiple Informants  tamsulosin [facility]) 0.4 MG CAPS capsule [id number] Yes Take 0.4 mg by mouth every other day. [provider] Taking Active Multiple Informants  traMADol (ULTRAM) 50 MG tablet [id number] No Take 1 tablet (50 mg total) by mouth daily as needed (hand pain).  Patient not taking:  Reported on 03/11/2022   [name], MD Not Taking Active   TYLENOL 500 MG tablet [id number] Yes Take 500-1,000 mg by mouth every 6 (six) hours as needed for mild pain or headache. [provider] Taking Active Multiple Informants            SDOH:  (Social Determinants of Health) assessments and interventions performed: No, done within the year Financial Resource Strain: Low Risk  (03/11/2022)   Overall Financial Resource Strain (CARDIA)    Difficulty of Paying Living Expenses: Not hard at all    SDOH Interventions    Flowsheet Row Clinical Support from 03/11/2022 in [place] Management from 02/26/2022 in [place] from 10/19/2021 in [place] Patient Outreach Telephone from 10/08/2021 in [place] Management from 01/30/2020 in [place] Interventions Intervention Not Indicated Intervention Not Indicated Intervention Not Indicated Intervention Not Indicated Other (Comment)  Housing Interventions Intervention Not Indicated Intervention Not Indicated Intervention Not Indicated -- --  Transportation Interventions Intervention Not Indicated Intervention Not Indicated Intervention Not Indicated Other (Comment)  [needs transportation for post hospital visit referral to transportation] Other (Comment)  Utilities Interventions Intervention Not Indicated Intervention Not Indicated -- -- --  Financial Strain Interventions Intervention Not Indicated Intervention Not Indicated -- -- --  Physical Activity Interventions Intervention Not Indicated Patient Refused -- -- --  Stress Interventions Intervention Not Indicated Intervention Not Indicated -- -- --  Social Connections Interventions Intervention Not Indicated Intervention Not Indicated, Patient  Refused -- -- --      SDOH Screenings   Food Insecurity: No Food Insecurity (03/11/2022)  Housing: Low Risk  (03/11/2022)  Transportation Needs: No Transportation Needs (03/11/2022)  Utilities: Not At Risk (03/11/2022)  Depression (PHQ2-9): Low Risk  (03/11/2022)  Financial Resource Strain: Low Risk  (03/11/2022)  Physical Activity: Inactive (03/11/2022)  Social Connections: Moderately Isolated (03/11/2022)  Stress: No Stress Concern Present (03/11/2022)  Tobacco Use: Medium Risk (03/11/2022)    Medication Assistance: None required.  Patient affirms current coverage meets needs.  Medication Access: Within the past 30 days, how often has patient missed a dose of medication? 0 Is a pillbox or other method used to improve adherence? Yes  Factors that may affect medication adherence? no barriers identified Are meds synced by current pharmacy? No  Are meds delivered by current pharmacy? No  Does patient experience delays in picking up medications due to transportation concerns? No   [facility] Reviewed: Is patient disadvantaged to use [facility]?: Yes  Current Rx insurance plan: Medicare D Name and location of Current pharmacy:  [facility] [place], [place] - [address] [place] [address] [place] [address] [place] [address] [place] [address] [place] [address] Phone: [phone] Fax: [phone]  [facility] services reviewed with patient today?: Yes  Patient requests to transfer care to [facility]?: No  Reason patient declined to change pharmacies: Loyalty to other pharmacy/Patient preference  Compliance/Adherence/Medication fill history: Care Gaps: None  Star-Rating Drugs: No fill info for Jardiance and Lantus - patient reports supply at home   Assessment/Plan   Hypertension (BP goal <140/90) 03/16/22 -Controlled -Current treatment: Diltiazem '360mg'$  24hr daily Appropriate, Effective, Safe, Accessible Toprolol XL '100mg'$  daily   Appropriate, Effective, Safe, Accessible -Medications previously tried: none noted  -Current home readings: "WNL" per patient, office readings are at goal -Denies hypotensive/hypertensive symptoms -Educated on BP goals and benefits of medications for prevention of heart attack, stroke and kidney damage; Importance of home blood pressure monitoring; Symptoms of hypotension and importance of maintaining adequate hydration; No changes at this time, patient doing well. Working on [facility] for DM.  Diabetes (A1c goal <8%) 03/16/22 -Uncontrolled, based on recent A1c of 8.7 -Current medications: Empagliflozin '25mg'$  once daily Appropriate, Effective, Safe, Accessible Metformin 500 mg twice daily with meal Appropriate, Effective, Safe, Accessible Insulin glargine (Lantus) 40 units injected into skin every morning Appropriate, Effective, Safe, Accessible -Medications previously tried: none noted  -Current home glucose readings fasting glucose: 79 today, 90-110 normally post prandial glucose: did not discuss today -Denies hypoglycemic/hyperglycemic symptoms -Educated on A1c and blood sugar goals; -Patient aware of elevated glucose.  No medication changes were made - he has been working hard on his diet and reports all fasting sugars are at goal since A1c.  He is watching his carbs and sugars.  All medications are affordable.   Denies any hypoglycemia - counseled on less strict control at his age to avoid hypoglycemia - patient is aware and manages his sugars well. FU on this in 6 months. CMA to check on sugars in 3 months.  Patient Goals/Self-Care Activities Patient will:  - take medications as prescribed check glucose periodically, document, and provide at future appointments  Follow Up Plan: The care management team will reach out to the patient again over the next 180 days.         Beverly Milch, PharmD Clinical Pharmacist  [facility] [phone]

## 2022-03-11 NOTE — Patient Instructions (Signed)
Please return in 3 months for diabetes follow up   Increase your gabapentin dose: Take 2 tabs at night (continue 1 tab in am and afternoon) for 2-4 weeks. IF you tolerate that, then you can increase to 2 tabs in the morning and night, (continue the 1 tab in the afternoon). If tolerating that, we can consider increasing the afternoon dose as well depending on if it is helping your neuropathy symptoms.   I will refill it once we know what dose is working for you. For now, use what you have.   If you have any questions or concerns, please don't hesitate to send me a message via MyChart or call the office at [phone]. Thank you for visiting with [place] today! It's our pleasure caring for you.

## 2022-03-11 NOTE — Patient Instructions (Signed)
[name] , Thank you for taking time to come for your Medicare Wellness Visit. I appreciate your ongoing commitment to your health goals. Please review the following plan we discussed and let me know if I can assist you in the future.   These are the goals we discussed:  Goals      CCM (DIABETES) EXPECTED OUTCOME:  MONITOR, SELF-MANAGE AND REDUCE SYMPTOMS OF DIABETES     Current Barriers:  Knowledge Deficits related to Diabetes management Chronic Disease Management support and education needs related to Diabetes, diet No Advanced Directives in place- pt declines information Pt reports he checks CBG once daily with readings in 90-120's range, states drinks unsweetened beverages, does not always follow a special diet  Planned Interventions: Provided education to patient about basic DM disease process; Reviewed medications with patient and discussed importance of medication adherence;        Reviewed prescribed diet with patient carbohydrate modified; Counseled on importance of regular laboratory monitoring as prescribed;        Discussed plans with patient for ongoing care management follow up and provided patient with direct contact information for care management team;      Provided patient with written educational materials related to hypo and hyperglycemia and importance of correct treatment;       Advised patient, providing education and rationale, to check cbg once daily and record        Review of patient status, including review of consultants reports, relevant laboratory and other test results, and medications completed;       Advised patient to discuss any issues with blood sugar (outside normal parameters)  with provider;      Screening for signs and symptoms of depression related to chronic disease state;        Assessed social determinant of health barriers;        Reviewed importance of exercise Pain assessment completed  Symptom Management: Take medications as prescribed    Attend all scheduled provider appointments Call pharmacy for medication refills 3-7 days in advance of running out of medications Perform all self care activities independently  Perform IADL's (shopping, preparing meals, housekeeping, managing finances) independently Call provider office for new concerns or questions  check blood sugar at prescribed times: once daily check feet daily for cuts, sores or redness enter blood sugar readings and medication or insulin into daily log take the blood sugar log to all doctor visits take the blood sugar meter to all doctor visits trim toenails straight across drink 6 to 8 glasses of water each day fill half of plate with vegetables limit fast food meals to no more than 1 per week manage portion size read food labels for fat, fiber, carbohydrates and portion size Try to exercise some daily- even if just walking for a few minutes at a time Look over education sent via my chart- hypoglycemia  Follow Up Plan: Telephone follow up appointment with care management team member scheduled for:  04/28/22 at 1045 am       CCM (HYPERTENSION) EXPECTED OUTCOME: MONITOR, SELF-MANAGE AND REDUCE SYMPTOMS OF HYPERTENSION     Current Barriers:  Knowledge Deficits related to Hypertension management Chronic Disease Management support and education needs related to Hypertension, diet No Advanced Directives in place- pt declines Pt reports he lives with spouse [name], is independent with all aspects of his care, continues to drive Pt reports he has blood pressure cuff but does not monitor and states " it's usually good"  Planned  Interventions: Evaluation of current treatment plan related to hypertension self management and patient's adherence to plan as established by provider;   Reviewed prescribed diet low sodium Reviewed medications with patient and discussed importance of compliance;  Counseled on the importance of exercise goals with target of 150 minutes  per week Discussed plans with patient for ongoing care management follow up and provided patient with direct contact information for care management team; Advised patient, providing education and rationale, to monitor blood pressure daily and record, calling PCP for findings outside established parameters;  Advised patient to discuss any issues with blood pressure, medications with provider; Provided education on prescribed diet low sodium;  Discussed complications of poorly controlled blood pressure such as heart disease, stroke, circulatory complications, vision complications, kidney impairment, sexual dysfunction;  Screening for signs and symptoms of depression related to chronic disease state;  Assessed social determinant of health barriers;  Reviewed safety precautions  Symptom Management: Take medications as prescribed   Attend all scheduled provider appointments Call pharmacy for medication refills 3-7 days in advance of running out of medications Perform all self care activities independently  Perform IADL's (shopping, preparing meals, housekeeping, managing finances) independently Call provider office for new concerns or questions  check blood pressure weekly choose a place to take my blood pressure (home, clinic or office, retail store) write blood pressure results in a log or diary learn about high blood pressure keep a blood pressure log take blood pressure log to all doctor appointments call doctor for signs and symptoms of high blood pressure keep all doctor appointments take medications for blood pressure exactly as prescribed report new symptoms to your doctor eat more whole grains, fruits and vegetables, lean meats and healthy fats Look over education sent via my chart- low sodium diet  Follow Up Plan: Telephone follow up appointment with care management team member scheduled for:  04/28/22 at 1045 am       Monitor and Manage My Blood Sugar-Diabetes Type 2      Timeframe:  Long-Range Goal Priority:  High Start Date:    10/29/20                         Expected End Date:   02/31/23                    Follow Up Date 01/28/21    - check blood sugar at prescribed times - check blood sugar if I feel it is too high or too low - take the blood sugar log to all doctor visits    Why is this important?   Checking your blood sugar at home helps to keep it from getting very high or very low.  Writing the results in a diary or log helps the doctor know how to care for you.  Your blood sugar log should have the time, date and the results.  Also, write down the amount of insulin or other medicine that you take.  Other information, like what you ate, exercise done and how you were feeling, will also be helpful.     Notes:      Patient Stated     Maintain current health by staying active.      Patient Stated     Maintain current health     Patient Stated     Stay healthy     Patient Stated     Stay alive and healthy  PharmD Care Plan     CARE PLAN ENTRY (see longitudinal plan of care for additional care plan information)  Current Barriers:  Chronic Disease Management support, education, and care coordination needs related to Hypertension, Hyperlipidemia, and Diabetes   Hypertension BP Readings from Last 3 Encounters:  01/17/20 132/80  11/30/19 104/60  08/13/19 110/60  Pharmacist Clinical Goal(s): Over the next 365 days, patient will work with PharmD and providers to maintain BP goal <140/90 Current regimen:  Metoprolol succinate 100 mg twice twice (blood pressure, heart rate control in atrial fibrillation) Diltiazem CR 360 mg once daily(blood pressure, heart rate control in atrial fibrillation) Interventions: We discussed diet and exercise extensively. Maintain a healthy weight and exercise regularly, as directed by your health care provider. Eat healthy foods, such as: Lean proteins, complex carbohydrates, fresh fruits and vegetables,  low-fat dairy products, healthy fats. Reviewed home BP monitoring Patient self care activities - Over the next 365 days, patient will: Check BP at least once every 1-2 weeks, document, and provide at future appointments Ensure daily salt intake < 2300 mg/day  Hyperlipidemia Lab Results  Component Value Date/Time   LDLCALC 79 08/13/2019 11:54 AM  Pharmacist Clinical Goal(s): Over the next 365 days, patient will work with PharmD and providers to maintain LDL goal < 100 Current regimen:  Atorvastatin 40 mg once daily Interventions: Diet/exercise per hypertension Patient self care activities - Over the next 365 days, patient will: Continue current management  Diabetes Lab Results  Component Value Date/Time   HGBA1C 8.4 11/21/2019 12:00 AM   HGBA1C 8.0 (A) 08/13/2019 11:59 AM   HGBA1C 8.6 (A) 05/22/2019 10:24 AM   HGBA1C 9.0 02/09/2018 12:00 AM  Pharmacist Clinical Goal(s): Over the next 180 days, patient will work with PharmD and providers to achieve A1c goal <8% Current regimen:  Jardiance 10 mg once daily Metformin 500 mg twice daily Insulin glargine 38 units once every morning  Interventions: Reviewed blood sugar correction - rule of 15 - handout provided Patient self care activities - Over the next 365 days, patient will: Check blood sugar once daily, document, and provide at future appointments Contact provider with any episodes of hypoglycemia  Medication management Pharmacist Clinical Goal(s): Over the next 365 days, patient will work with PharmD and providers to maintain optimal medication adherence Current pharmacy: [facility] Interventions Comprehensive medication review performed. Continue current medication management strategy Patient self care activities - Over the next 365 days, patient will: Take medications as prescribed Report any questions or concerns to PharmD and/or provider(s) Initial goal documentation.        This is a list of the screening  recommended for you and due dates:  Health Maintenance  Topic Date Due   COVID-19 Vaccine (7 - 2023-24 season) 10/30/2021   Eye exam for diabetics  05/07/2022   Hemoglobin A1C  05/12/2022   Complete foot exam   08/04/2022   Medicare Annual Wellness Visit  03/12/2023   DTaP/Tdap/Td vaccine (4 - Td or Tdap) 02/16/2027   Pneumonia Vaccine  Completed   Flu Shot  Completed   Zoster (Shingles) Vaccine  Completed   HPV Vaccine  Aged Out    Advanced directives: Please bring a copy of your health care power of attorney and living will to the office at your convenience.  Conditions/risks identified: stay alive and healthy  Next appointment: Follow up in one year for your annual wellness visit.   Preventive Care 72 Years and Older, Male  Preventive care refers to lifestyle  choices and visits with your health care provider that can promote health and wellness. What does preventive care include? A yearly physical exam. This is also called an annual well check. Dental exams once or twice a year. Routine eye exams. Ask your health care provider how often you should have your eyes checked. Personal lifestyle choices, including: Daily care of your teeth and gums. Regular physical activity. Eating a healthy diet. Avoiding tobacco and drug use. Limiting alcohol use. Practicing safe sex. Taking low doses of aspirin every day. Taking vitamin and mineral supplements as recommended by your health care provider. What happens during an annual well check? The services and screenings done by your health care provider during your annual well check will depend on your age, overall health, lifestyle risk factors, and family history of disease. Counseling  Your health care provider may ask you questions about your: Alcohol use. Tobacco use. Drug use. Emotional well-being. Home and relationship well-being. Sexual activity. Eating habits. History of falls. Memory and ability to understand  (cognition). Work and work Statistician. Screening  You may have the following tests or measurements: Height, weight, and BMI. Blood pressure. Lipid and cholesterol levels. These may be checked every 5 years, or more frequently if you are over 45 years old. Skin check. Lung cancer screening. You may have this screening every year starting at age 52 if you have a 30-pack-year history of smoking and currently smoke or have quit within the past 15 years. Fecal occult blood test (FOBT) of the stool. You may have this test every year starting at age 68. Flexible sigmoidoscopy or colonoscopy. You may have a sigmoidoscopy every 5 years or a colonoscopy every 10 years starting at age 52. Prostate cancer screening. Recommendations will vary depending on your family history and other risks. Hepatitis C blood test. Hepatitis B blood test. Sexually transmitted disease (STD) testing. Diabetes screening. This is done by checking your blood sugar (glucose) after you have not eaten for a while (fasting). You may have this done every 1-3 years. Abdominal aortic aneurysm (AAA) screening. You may need this if you are a current or former smoker. Osteoporosis. You may be screened starting at age 32 if you are at high risk. Talk with your health care provider about your test results, treatment options, and if necessary, the need for more tests. Vaccines  Your health care provider may recommend certain vaccines, such as: Influenza vaccine. This is recommended every year. Tetanus, diphtheria, and acellular pertussis (Tdap, Td) vaccine. You may need a Td booster every 10 years. Zoster vaccine. You may need this after age 41. Pneumococcal 13-valent conjugate (PCV13) vaccine. One dose is recommended after age 24. Pneumococcal polysaccharide (PPSV23) vaccine. One dose is recommended after age 20. Talk to your health care provider about which screenings and vaccines you need and how often you need them. This  information is not intended to replace advice given to you by your health care provider. Make sure you discuss any questions you have with your health care provider. Document Released: 03/14/2015 Document Revised: 11/05/2015 Document Reviewed: 12/17/2014 Elsevier Interactive Patient Education  2017 [place] Prevention in the Home Falls can cause injuries. They can happen to people of all ages. There are many things you can do to make your home safe and to help prevent falls. What can I do on the outside of my home? Regularly fix the edges of walkways and driveways and fix any cracks. Remove anything that might make you trip as  you walk through a door, such as a raised step or threshold. Trim any bushes or trees on the path to your home. Use bright outdoor lighting. Clear any walking paths of anything that might make someone trip, such as rocks or tools. Regularly check to see if handrails are loose or broken. Make sure that both sides of any steps have handrails. Any raised decks and porches should have guardrails on the edges. Have any leaves, snow, or ice cleared regularly. Use sand or salt on walking paths during winter. Clean up any spills in your garage right away. This includes oil or grease spills. What can I do in the bathroom? Use night lights. Install grab bars by the toilet and in the tub and shower. Do not use towel bars as grab bars. Use non-skid mats or decals in the tub or shower. If you need to sit down in the shower, use a plastic, non-slip stool. Keep the floor dry. Clean up any water that spills on the floor as soon as it happens. Remove soap buildup in the tub or shower regularly. Attach bath mats securely with double-sided non-slip rug tape. Do not have throw rugs and other things on the floor that can make you trip. What can I do in the bedroom? Use night lights. Make sure that you have a light by your bed that is easy to reach. Do not use any sheets or  blankets that are too big for your bed. They should not hang down onto the floor. Have a firm chair that has side arms. You can use this for support while you get dressed. Do not have throw rugs and other things on the floor that can make you trip. What can I do in the kitchen? Clean up any spills right away. Avoid walking on wet floors. Keep items that you use a lot in easy-to-reach places. If you need to reach something above you, use a strong step stool that has a grab bar. Keep electrical cords out of the way. Do not use floor polish or wax that makes floors slippery. If you must use wax, use non-skid floor wax. Do not have throw rugs and other things on the floor that can make you trip. What can I do with my stairs? Do not leave any items on the stairs. Make sure that there are handrails on both sides of the stairs and use them. Fix handrails that are broken or loose. Make sure that handrails are as long as the stairways. Check any carpeting to make sure that it is firmly attached to the stairs. Fix any carpet that is loose or worn. Avoid having throw rugs at the top or bottom of the stairs. If you do have throw rugs, attach them to the floor with carpet tape. Make sure that you have a light switch at the top of the stairs and the bottom of the stairs. If you do not have them, ask someone to add them for you. What else can I do to help prevent falls? Wear shoes that: Do not have high heels. Have rubber bottoms. Are comfortable and fit you well. Are closed at the toe. Do not wear sandals. If you use a stepladder: Make sure that it is fully opened. Do not climb a closed stepladder. Make sure that both sides of the stepladder are locked into place. Ask someone to hold it for you, if possible. Clearly mark and make sure that you can see: Any grab bars or handrails. First  and last steps. Where the edge of each step is. Use tools that help you move around (mobility aids) if they are  needed. These include: Canes. Walkers. Scooters. Crutches. Turn on the lights when you go into a dark area. Replace any light bulbs as soon as they burn out. Set up your furniture so you have a clear path. Avoid moving your furniture around. If any of your floors are uneven, fix them. If there are any pets around you, be aware of where they are. Review your medicines with your doctor. Some medicines can make you feel dizzy. This can increase your chance of falling. Ask your doctor what other things that you can do to help prevent falls. This information is not intended to replace advice given to you by your health care provider. Make sure you discuss any questions you have with your health care provider. Document Released: 12/12/2008 Document Revised: 07/24/2015 Document Reviewed: 03/22/2014 Elsevier Interactive Patient Education  2017 [employer].

## 2022-03-12 ENCOUNTER — Telehealth: Payer: Self-pay | Admitting: Pharmacist

## 2022-03-12 NOTE — Progress Notes (Signed)
Care Management & Coordination Services Pharmacy Team  Reason for Encounter: Appointment Reminder  Patient contacted to confirm telephone appointment with [name] PharmD, on 03/16/2022 at 3:30 pm.  April D Calhoun, [place] Pharmacist Assistant [phone]

## 2022-03-13 DIAGNOSIS — Z23 Encounter for immunization: Secondary | ICD-10-CM | POA: Diagnosis not present

## 2022-03-15 NOTE — Progress Notes (Signed)
Subjective  Chief Complaint  Patient presents with   Annual Exam   Diabetes    HPI: [name] is a 89 y.o. male who presents to [place] at [place] today for a Male Wellness Visit. He also has the concerns and/or needs as listed above in the chief complaint. These will be addressed in addition to the Health Maintenance Visit.   Wellness Visit: annual visit with health maintenance review and exam   HM: still independent, driving locally w/o cognitive or mood concerns. Recent visit to [place]. Eligible for covid booster. Other imms current. Appetite is good. No falls   Body mass index is 33.73 kg/m. Wt Readings from Last 3 Encounters:  03/11/22 209 lb (94.8 kg)  03/11/22 209 lb (94.8 kg)  01/27/22 209 lb 9.6 oz (95.1 kg)     Chronic disease management visit and/or acute problem visit: CAD/AS/CH and HTN: reviewed cardiology visit. Stable overall. No active angina or symptomatic heart failure. Echo due next year. No lightheadedness.  DM: says it is doing fine. Intermittently checks sugars. No symptomatic lows. C/o neuropathy sxs worsening: feels like he is walking on cobblestones. No sores. On gabapentin 300 tid but sxs are worsening.  Back and leg pain are stable currently.  HLD on statin and has been well controlled.  Afib on blood thinners w/o bleeding.  Hearing loss: recently got hearing aides from [place]: working very well.  Monitoring renal function; edema managed with lasix (ckd and chf)  Patient Active Problem List   Diagnosis Date Noted   Mild cognitive impairment 03/05/2021   Moderate nonproliferative diabetic retinopathy of left eye ([place]) 06/18/2020   Stage 3b chronic kidney disease ([place]) 05/26/2020   Aortic stenosis, moderate 05/22/2019   Chronic diastolic CHF (congestive heart failure) ([place]) 03/14/2019   Spinal stenosis of lumbar region 02/21/2018   Diabetic peripheral neuropathy associated with type 2 diabetes mellitus ([place]) 05/05/2017    Atherosclerotic heart disease of native coronary artery without angina pectoris 06/13/2016   CVA (cerebral vascular accident) ([place]) 06/06/2016   Recurrent coronary arteriosclerosis after percutaneous transluminal coronary angioplasty 04/18/2015   CAD S/P PCI- Nov 2016    Current use of long term anticoagulation 02/06/2013   Chronic atrial fibrillation ([place]) 07/30/2011   Hx of CABG x 2 1990    Hypertension associated with diabetes ([place])    Diabetes mellitus with diabetic nephropathy, with long-term current use of insulin ([place])    Combined hyperlipidemia associated with type 2 diabetes mellitus ([place])    Thoracic aortic aneurysm without rupture ([place]) 05/26/2020   Degeneration of lumbar intervertebral disc 02/21/2018   Chronic vertigo 03/15/2014   Obesity (BMI 30.0-34.9) 01/31/2012   Enlarged prostate without lower urinary tract symptoms (luts) 01/04/2011   Gastro-esophageal reflux disease without esophagitis 12/07/2010   Epistaxis, recurrent 04/11/2015   Osteoarthritis, knee 01/31/2012   Allergic rhinitis 10/28/2011   Hearing loss 06/08/2011   Primary osteoarthritis of hand 06/08/2011   Benign hematuria 08/27/2008   Osteoarthritis of metacarpophalangeal (MCP) joint of left thumb 01/18/2022   Stable hemispheric central retinal vein occlusion (CRVO) of right eye 07/21/2020   Lower extremity edema 03/14/2019   Health Maintenance  Topic Date Due   COVID-19 Vaccine (7 - 2023-24 season) 03/27/2022 (Originally 10/30/2021)   OPHTHALMOLOGY EXAM  05/07/2022   FOOT EXAM  08/04/2022   HEMOGLOBIN A1C  09/09/2022   Medicare Annual Wellness (AWV)  03/12/2023   DTaP/Tdap/Td (4 - Td or Tdap) 02/16/2027   Pneumonia Vaccine 11+ Years old  Completed   INFLUENZA VACCINE  Completed   Zoster Vaccines- Shingrix  Completed   HPV VACCINES  Aged Out   Immunization History  Administered Date(s) Administered   Fluad Quad(high Dose 65+) 11/02/2018, 11/30/2019, 11/07/2020, 11/11/2021   Influenza Split  11/14/2007, 01/01/2009, 12/23/2009, 11/29/2012, 10/31/2015   Influenza, High Dose Seasonal PF 11/02/2012, 12/12/2013, 11/15/2014, 11/24/2017, 01/17/2018   Influenza, Seasonal, Injecte, Preservative Fre 11/18/2015   Influenza,inj,Quad PF,6+ Mos 02/15/2017   Influenza,trivalent, recombinat, inj, PF 12/07/2010   Influenza-Unspecified 10/28/2011, 11/30/2014, 02/15/2017   PFIZER Comirnaty(Gray Top)Covid-19 Tri-Sucrose Vaccine 06/21/2020   PFIZER(Purple Top)SARS-COV-2 Vaccination 03/23/2019, 04/14/2019, 12/06/2019, 06/21/2020   PNEUMOCOCCAL CONJUGATE-20 08/06/2020   Pfizer Covid-19 Vaccine Bivalent Booster 95yr & up 01/02/2021   Pneumococcal Conjugate-13 12/13/2013   Pneumococcal Polysaccharide-23 08/20/2008   Td 05/29/2002   Tdap 10/28/2011, 02/15/2017   Zoster Recombinat (Shingrix) 09/16/2016, 10/13/2016, 12/13/2016, 01/11/2017   Zoster, Live 08/20/2008   We updated and reviewed the patient's past history in detail and it is documented below. Allergies: Patient is allergic to septra [sulfamethoxazole-trimethoprim], cefadroxil, ciprofloxacin, and erythromycin. Past Medical History  has a past medical history of Abdominal pain, Acute renal failure ([place], Arthritis, Ataxia, Atrial fibrillation ([place], Bladder outlet obstruction, Bladder outlet obstruction, BPH (benign prostatic hyperplasia), CAD (coronary artery disease), Constipation, Diabetes mellitus type 2, insulin dependent ([place], Epistaxis, recurrent (Feb 2017), GERD (gastroesophageal reflux disease), HTN (hypertension), bacterial pneumonia, Hyperlipemia, Kidney stones, Mild aortic stenosis, Mild cognitive impairment (03/05/2021), Pneumonia (04/2019), Rib fractures, Stented coronary artery (Nov 2016), and Urinary tract infection. Past Surgical History Patient  has a past surgical history that includes Coronary artery bypass graft (1989); Total hip arthroplasty (Right, 2000); Cardiac catheterization (1989); Carpal tunnel release (Right, 08/2010);  Cataract extraction w/ intraocular lens  implant, bilateral (Bilateral); Joint replacement; Cardiac catheterization (N/A, 01/08/2015); Cardiac catheterization (01/08/2015); Tonsillectomy; Coronary angioplasty (01/08/15); and IR THORACENTESIS ASP PLEURAL SPACE W/IMG GUIDE (05/14/2019). Social History Patient  reports that he quit smoking about 63 years ago. His smoking use included cigarettes. He has a 30.00 pack-year smoking history. He has never used smokeless tobacco. He reports that he does not drink alcohol and does not use drugs. Family History family history includes Brain cancer in his father; Throat cancer in his brother. Review of Systems: Constitutional: negative for fever or malaise Ophthalmic: negative for photophobia, double vision or loss of vision Cardiovascular: negative for chest pain, dyspnea on exertion, or new LE swelling Respiratory: negative for SOB or persistent cough Gastrointestinal: negative for abdominal pain, change in bowel habits or melena Genitourinary: negative for dysuria or gross hematuria Musculoskeletal: negative for new gait disturbance or muscular weakness Integumentary: negative for new or persistent rashes Neurological: negative for TIA or stroke symptoms Psychiatric: negative for SI or delusions Allergic/Immunologic: negative for hives  Patient Care Team    Relationship Specialty Notifications Start End  ALeamon Arnt MD PCP - General Family Medicine  12/27/13   JMartinique Peter M, MD PCP - Cardiology Cardiology Admissions 10/04/18   JMartinique Peter M, MD Consulting Physician Cardiology  02/16/17   MGardiner Barefoot DPM Consulting Physician Podiatry  02/16/17   FElam Dutch MD (Inactive) Consulting Physician Vascular Surgery  02/16/17   BMelida Quitter MD Consulting Physician Otolaryngology  02/16/17   RZadie RhineGClent Demark MD Consulting Physician Ophthalmology  06/08/17   [facility], [facility]  11/02/18   Pa, [facility]  Neurosurgery  12/21/18   DEdythe Clarity [facility] Pharmacist  10/29/20   XLeandrew Koyanagi  MD Consulting Physician Orthopedic Surgery  07/08/21   Tobi Bastos, [place] [facility]   10/13/21    Objective  Vitals: BP 112/70   Pulse 85   Temp 98.5 F (36.9 C)   Ht '5\' 6"'$  (1.676 m)   Wt 209 lb (94.8 kg)   SpO2 97%   BMI 33.73 kg/m  General:  Well developed, well nourished, no acute distress  Psych:  Alert and orientedx3,normal mood and affect HEENT:  Normocephalic, atraumatic, non-icteric sclera, PERRL,  Cardiovascular:  irreg irreg, + murmur Respiratory:  Good breath sounds bilaterally, CTAB with normal respiratory effort Gastrointestinal: normal bowel sounds, soft, non-tender, no noted masses. No HSM Neurologic:    Mental status is normal.  Gross motor and sensory exams are normal. Stable gait. No tremor    Assessment  1. Type 2 diabetes mellitus with diabetic nephropathy, with long-term current use of insulin ([place])   2. Chronic atrial fibrillation (HCC)   3. CAD S/P PCI- Nov 2016   4. Diabetic peripheral neuropathy associated with type 2 diabetes mellitus ([place])   5. Hypertension associated with diabetes ([place])   6. Combined hyperlipidemia associated with type 2 diabetes mellitus ([place])   7. Chronic diastolic CHF (congestive heart failure) ([place])   8. Stage 3b chronic kidney disease ([place])   9. Moderate nonproliferative diabetic retinopathy of left eye without macular edema associated with type 2 diabetes mellitus [facility])      Plan  Male Wellness Visit: Age appropriate Health Maintenance and Prevention measures were discussed with patient. Included topics are cancer screening recommendations, ways to keep healthy (see AVS) including dietary and exercise recommendations, regular eye and dental care, use of seat belts, and avoidance of moderate alcohol use and tobacco use.   Chronic disease f/u and/or acute problem visit: (deemed  necessary to be done in addition to the wellness visit): DM: check A1c and renal function. Peripheral neuropathy: increase gabapentin dose up slowly. See AVS. Monitor for adverse side effects given his ag. Goal A1c < 8.0 HTN is controlled well. No changes in med CAD/Afib/CHF: managed by cards and stable.  Monitor renal function and cbc on eloquis HLD recheck fasting lipids.    Follow up: 3 mo  Commons side effects, risks, benefits, and alternatives for medications and treatment plan prescribed today were discussed, and the patient expressed understanding of the given instructions. Patient is instructed to call or message via MyChart if he/she has any questions or concerns regarding our treatment plan. No barriers to understanding were identified. We discussed Red Flag symptoms and signs in detail. Patient expressed understanding regarding what to do in case of urgent or emergency type symptoms.  Medication list was reconciled, printed and provided to the patient in AVS. Patient instructions and summary information was reviewed with the patient as documented in the AVS. This note was prepared with assistance of Dragon voice recognition software. Occasional wrong-word or sound-a-like substitutions may have occurred due to the inherent limitations of voice recognition software    Orders Placed This Encounter  Procedures   CBC with Differential/Platelet   Comprehensive metabolic panel   Lipid panel   Hemoglobin A1c   TSH   Meds ordered this encounter  Medications   gabapentin (NEURONTIN) 300 MG capsule    Sig: Take 2 capsules (600 mg total) by mouth 3 (three) times daily.    Dispense:  180 capsule    Refill:  5

## 2022-03-16 ENCOUNTER — Ambulatory Visit: Payer: Medicare Other | Admitting: Pharmacist

## 2022-03-25 ENCOUNTER — Ambulatory Visit: Payer: Medicare Other | Admitting: Family Medicine

## 2022-03-26 ENCOUNTER — Encounter: Payer: Self-pay | Admitting: Family Medicine

## 2022-03-26 ENCOUNTER — Ambulatory Visit (INDEPENDENT_AMBULATORY_CARE_PROVIDER_SITE_OTHER): Payer: Medicare Other | Admitting: Family Medicine

## 2022-03-26 VITALS — BP 120/64 | HR 83 | Temp 98.1°F | Ht 66.0 in | Wt 203.2 lb

## 2022-03-26 DIAGNOSIS — D509 Iron deficiency anemia, unspecified: Secondary | ICD-10-CM | POA: Diagnosis not present

## 2022-03-26 NOTE — Progress Notes (Signed)
Subjective  CC:  Chief Complaint  Patient presents with   Discuss Labs    HPI: [name] is a 89 y.o. male who presents to the office today to address the problems listed above in the chief complaint. VA: hgb down to 10.3 although was 11.0 and stable by my recent labs.  Has had proressive microcytic anemia thought to be chornic disease although has improved along with renal function. I reviewed numbers over the last several years. No melena. No recent CRC screen due to age. Feels fine  Lab Results  Component Value Date   WBC 8.8 03/11/2022   HGB 11.0 (L) 03/11/2022   HCT 35.1 (L) 03/11/2022   MCV 71.1 (L) 03/11/2022   PLT 312.0 03/11/2022     Lab Results  Component Value Date   CREATININE 1.37 03/11/2022   BUN 23 03/11/2022   NA 144 03/11/2022   K 4.6 03/11/2022   CL 104 03/11/2022   CO2 30 03/11/2022   Lab Results  Component Value Date   IRON 12 (L) 10/06/2021   TIBC 302 10/06/2021   FERRITIN 89 10/06/2021    Assessment  1. Microcytic anemia      Plan  anemia:  mainly stable; has been worse. On blood thinner. Possible GI contributing but could be renal as well. Discussed options of further evaluation. Given age and comorbidities, want to be conservative. Start with ifob for occult blood. Could consider gi referral if progresses. Will monitor closely.   Follow up: No follow-ups on file.  04/28/2022  Orders Placed This Encounter  Procedures   Fecal occult blood, imunochemical([facility])   No orders of the defined types were placed in this encounter.     I reviewed the patients updated PMH, FH, and SocHx.    Patient Active Problem List   Diagnosis Date Noted   Mild cognitive impairment 03/05/2021    Priority: High   Moderate nonproliferative diabetic retinopathy of left eye ([place]) 06/18/2020    Priority: High   Stage 3b chronic kidney disease ([place]) 05/26/2020    Priority: High   Aortic stenosis, moderate 05/22/2019    Priority: High    Chronic diastolic CHF (congestive heart failure) ([place]) 03/14/2019    Priority: High   Spinal stenosis of lumbar region 02/21/2018    Priority: High   Diabetic peripheral neuropathy associated with type 2 diabetes mellitus ([place]) 05/05/2017    Priority: High   Atherosclerotic heart disease of native coronary artery without angina pectoris 06/13/2016    Priority: High   CVA (cerebral vascular accident) ([place]) 06/06/2016    Priority: High   Recurrent coronary arteriosclerosis after percutaneous transluminal coronary angioplasty 04/18/2015    Priority: High   CAD S/P PCI- Nov 2016     Priority: High   Current use of long term anticoagulation 02/06/2013    Priority: High   Chronic atrial fibrillation ([place]) 07/30/2011    Priority: High   Hx of CABG x 2 1990     Priority: High   Hypertension associated with diabetes ([place])     Priority: High   Diabetes mellitus with diabetic nephropathy, with long-term current use of insulin (HCC)     Priority: High   Combined hyperlipidemia associated with type 2 diabetes mellitus ([place])     Priority: High   Thoracic aortic aneurysm without rupture ([place]) 05/26/2020    Priority: Medium    Degeneration of lumbar intervertebral disc 02/21/2018    Priority: Medium    Chronic vertigo 03/15/2014  Priority: Medium    Obesity (BMI 30.0-34.9) 01/31/2012    Priority: Medium    Enlarged prostate without lower urinary tract symptoms (luts) 01/04/2011    Priority: Medium    Gastro-esophageal reflux disease without esophagitis 12/07/2010    Priority: Medium    Epistaxis, recurrent 04/11/2015    Priority: Low   Osteoarthritis, knee 01/31/2012    Priority: Low   Allergic rhinitis 10/28/2011    Priority: Low   Hearing loss 06/08/2011    Priority: Low   Primary osteoarthritis of hand 06/08/2011    Priority: Low   Benign hematuria 08/27/2008    Priority: Low   Osteoarthritis of metacarpophalangeal (MCP) joint of left thumb 01/18/2022   Stable hemispheric  central retinal vein occlusion (CRVO) of right eye 07/21/2020   Lower extremity edema 03/14/2019   Current Meds  Medication Sig   apixaban (ELIQUIS) 5 MG TABS tablet Take 1 tablet (5 mg total) by mouth 2 (two) times daily.   atorvastatin (LIPITOR) 40 MG tablet Take 1 tablet (40 mg total) by mouth daily at 6 PM.   AVODART 0.5 MG capsule Take 0.5 mg by mouth See admin instructions. Take 0.5 mg by mouth every other night- at bedtime   Cholecalciferol (VITAMIN D3 PO) Take 1 capsule by mouth daily.   diltiazem (TIAZAC) 360 MG 24 hr capsule Take 360 mg by mouth daily.   empagliflozin (JARDIANCE) 25 MG TABS tablet Take 1 tablet (25 mg total) by mouth daily before breakfast.   furosemide (LASIX) 40 MG tablet Take 1 tablet (40 mg total) by mouth daily.   gabapentin (NEURONTIN) 300 MG capsule Take 2 capsules (600 mg total) by mouth 3 (three) times daily.   insulin glargine (LANTUS) 100 UNIT/ML injection Inject 40 Units into the skin every morning.   metFORMIN (GLUCOPHAGE) 500 MG tablet Take 500 mg by mouth 2 (two) times daily with a meal.   metoprolol succinate (TOPROL-XL) 100 MG 24 hr tablet Take 1 tablet (100 mg total) by mouth daily. Take with or immediately following a meal. (Patient taking differently: Take 100 mg by mouth in the morning.)   pantoprazole (PROTONIX) 40 MG tablet TAKE 1 TABLET BY MOUTH DAILY AT NOON (Patient taking differently: Take 40 mg by mouth daily in the afternoon.)   tamsulosin (FLOMAX) 0.4 MG CAPS capsule Take 0.4 mg by mouth every other day.   traMADol (ULTRAM) 50 MG tablet Take 1 tablet (50 mg total) by mouth daily as needed (hand pain).   TYLENOL 500 MG tablet Take 500-1,000 mg by mouth every 6 (six) hours as needed for mild pain or headache.    Allergies: Patient is allergic to septra [sulfamethoxazole-trimethoprim], cefadroxil, ciprofloxacin, and erythromycin. Family History: Patient family history includes Brain cancer in his father; Throat cancer in his  brother. Social History:  Patient  reports that he quit smoking about 63 years ago. His smoking use included cigarettes. He has a 30.00 pack-year smoking history. He has never used smokeless tobacco. He reports that he does not drink alcohol and does not use drugs.  Review of Systems: Constitutional: Negative for fever malaise or anorexia Cardiovascular: negative for chest pain Respiratory: negative for SOB or persistent cough Gastrointestinal: negative for abdominal pain  Objective  Vitals: BP 120/64 (BP Location: Left Arm, Patient Position: Sitting)   Pulse 83   Temp 98.1 F (36.7 C) (Temporal)   Ht '5\' 6"'$  (1.676 m)   Wt 203 lb 3.2 oz (92.2 kg)   SpO2 97%   BMI 32.80  kg/m  General: no acute distress , A&Ox3  Commons side effects, risks, benefits, and alternatives for medications and treatment plan prescribed today were discussed, and the patient expressed understanding of the given instructions. Patient is instructed to call or message via MyChart if he/she has any questions or concerns regarding our treatment plan. No barriers to understanding were identified. We discussed Red Flag symptoms and signs in detail. Patient expressed understanding regarding what to do in case of urgent or emergency type symptoms.  Medication list was reconciled, printed and provided to the patient in AVS. Patient instructions and summary information was reviewed with the patient as documented in the AVS. This note was prepared with assistance of Dragon voice recognition software. Occasional wrong-word or sound-a-like substitutions may have occurred due to the inherent limitations of voice recognition software

## 2022-03-26 NOTE — Patient Instructions (Signed)
Please follow up as scheduled for your next visit with me: 06/10/2022   If you have any questions or concerns, please don't hesitate to send me a message via MyChart or call the office at [phone]. Thank you for visiting with [place] today! It's our pleasure caring for you.   Please pick up the stool cards from the lab and return them.

## 2022-03-31 ENCOUNTER — Other Ambulatory Visit: Payer: Self-pay

## 2022-03-31 ENCOUNTER — Other Ambulatory Visit (INDEPENDENT_AMBULATORY_CARE_PROVIDER_SITE_OTHER): Payer: Medicare Other

## 2022-03-31 ENCOUNTER — Telehealth: Payer: Self-pay

## 2022-03-31 DIAGNOSIS — K921 Melena: Secondary | ICD-10-CM

## 2022-03-31 DIAGNOSIS — D509 Iron deficiency anemia, unspecified: Secondary | ICD-10-CM | POA: Diagnosis not present

## 2022-03-31 LAB — FECAL OCCULT BLOOD, IMMUNOCHEMICAL: Fecal Occult Bld: POSITIVE — AB

## 2022-03-31 NOTE — Telephone Encounter (Signed)
Mickel Baas from [facility] called and stated pt has a +IFOB

## 2022-03-31 NOTE — Telephone Encounter (Signed)
Pt has been notified of lab results and a referral to GI has been placed.

## 2022-04-01 ENCOUNTER — Ambulatory Visit: Payer: Medicare Other | Admitting: Family Medicine

## 2022-04-06 ENCOUNTER — Encounter: Payer: Self-pay | Admitting: Family Medicine

## 2022-04-07 ENCOUNTER — Encounter: Payer: Self-pay | Admitting: Gastroenterology

## 2022-04-07 ENCOUNTER — Other Ambulatory Visit (INDEPENDENT_AMBULATORY_CARE_PROVIDER_SITE_OTHER): Payer: Medicare Other

## 2022-04-07 ENCOUNTER — Ambulatory Visit (INDEPENDENT_AMBULATORY_CARE_PROVIDER_SITE_OTHER): Payer: Medicare Other | Admitting: Gastroenterology

## 2022-04-07 VITALS — BP 118/78 | HR 73 | Ht 66.0 in | Wt 205.0 lb

## 2022-04-07 DIAGNOSIS — D649 Anemia, unspecified: Secondary | ICD-10-CM

## 2022-04-07 DIAGNOSIS — R195 Other fecal abnormalities: Secondary | ICD-10-CM

## 2022-04-07 DIAGNOSIS — K922 Gastrointestinal hemorrhage, unspecified: Secondary | ICD-10-CM

## 2022-04-07 DIAGNOSIS — I482 Chronic atrial fibrillation, unspecified: Secondary | ICD-10-CM

## 2022-04-07 LAB — CBC WITH DIFFERENTIAL/PLATELET
Basophils Absolute: 0.2 10*3/uL — ABNORMAL HIGH (ref 0.0–0.1)
Basophils Relative: 2 % (ref 0.0–3.0)
Eosinophils Absolute: 0.3 10*3/uL (ref 0.0–0.7)
Eosinophils Relative: 2.2 % (ref 0.0–5.0)
HCT: 34.2 % — ABNORMAL LOW (ref 39.0–52.0)
Hemoglobin: 10.5 g/dL — ABNORMAL LOW (ref 13.0–17.0)
Lymphocytes Relative: 13.2 % (ref 12.0–46.0)
Lymphs Abs: 1.6 10*3/uL (ref 0.7–4.0)
MCHC: 30.8 g/dL (ref 30.0–36.0)
MCV: 71 fl — ABNORMAL LOW (ref 78.0–100.0)
Monocytes Absolute: 0.9 10*3/uL (ref 0.1–1.0)
Monocytes Relative: 7.3 % (ref 3.0–12.0)
Neutro Abs: 9 10*3/uL — ABNORMAL HIGH (ref 1.4–7.7)
Neutrophils Relative %: 75.3 % (ref 43.0–77.0)
Platelets: 281 10*3/uL (ref 150.0–400.0)
RBC: 4.81 Mil/uL (ref 4.22–5.81)
RDW: 18.9 % — ABNORMAL HIGH (ref 11.5–15.5)
WBC: 11.9 10*3/uL — ABNORMAL HIGH (ref 4.0–10.5)

## 2022-04-07 LAB — COMPREHENSIVE METABOLIC PANEL WITH GFR
ALT: 9 U/L (ref 0–53)
AST: 12 U/L (ref 0–37)
Albumin: 4.5 g/dL (ref 3.5–5.2)
Alkaline Phosphatase: 65 U/L (ref 39–117)
BUN: 23 mg/dL (ref 6–23)
CO2: 30 meq/L (ref 19–32)
Calcium: 9.6 mg/dL (ref 8.4–10.5)
Chloride: 105 meq/L (ref 96–112)
Creatinine, Ser: 1.27 mg/dL (ref 0.40–1.50)
GFR: 49.26 mL/min — ABNORMAL LOW
Glucose, Bld: 187 mg/dL — ABNORMAL HIGH (ref 70–99)
Potassium: 4.7 meq/L (ref 3.5–5.1)
Sodium: 143 meq/L (ref 135–145)
Total Bilirubin: 0.9 mg/dL (ref 0.2–1.2)
Total Protein: 7.4 g/dL (ref 6.0–8.3)

## 2022-04-07 NOTE — Patient Instructions (Addendum)
_______________________________________________________  If your blood pressure at your visit was 140/90 or greater, please contact your primary care physician to follow up on this.  _______________________________________________________  If you are age 87 or older, your body mass index should be between 23-30. Your Body mass index is 33.09 kg/m. If this is out of the aforementioned range listed, please consider follow up with your Primary Care Provider.  If you are age 9 or younger, your body mass index should be between 19-25. Your Body mass index is 33.09 kg/m. If this is out of the aformentioned range listed, please consider follow up with your Primary Care Provider.   ________________________________________________________  The [facility] providers would like to encourage you to use [facility] to communicate with providers for non-urgent requests or questions.  Due to long hold times on the telephone, sending your provider a message by [facility] may be a faster and more efficient way to get a response.  Please allow 48 business hours for a response.  Please remember that this is for non-urgent requests.  _______________________________________________________  Your provider has requested that you go to the basement level for lab work before leaving today. Press "B" on the elevator. The lab is located at the first door on the left as you exit the elevator.  You have been scheduled for an appointment with Dr. Lyndel Safe on  06-28-2022 at 1120am . Please arrive 10 minutes early for your appointment.  You have been scheduled for a CT scan of the abdomen and pelvis at [facility] ([address], [place], [place] [id number]).   You are scheduled on 04-21-2022 at 1pm . You should arrive at 10:45am for your appointment time for registration. Please follow the written instructions below on the day of your exam:  WARNING: IF YOU ARE ALLERGIC TO IODINE/X-RAY DYE, PLEASE NOTIFY RADIOLOGY  IMMEDIATELY AT [phone]! YOU WILL BE GIVEN A 13 HOUR PREMEDICATION PREP.  1) Do not eat or drink anything after 9am (4 hours prior to your test) 2) You have been given 2 bottles of oral contrast to drink. The solution may taste better if refrigerated, but do NOT add ice or any other liquid to this solution. Shake well before drinking.    Drink 1 bottle of contrast @ 11am (2 hours prior to your exam)  Drink 1 bottle of contrast @ 12pm (1 hour prior to your exam)  You may take any medications as prescribed with a small amount of water, if necessary. If you take any of the following medications: METFORMIN, GLUCOPHAGE, GLUCOVANCE, AVANDAMET, RIOMET, FORTAMET, [place] MET, JANUMET, GLUMETZA or METAGLIP, you MAY be asked to HOLD this medication 48 hours AFTER the exam.  The purpose of you drinking the oral contrast is to aid in the visualization of your intestinal tract. The contrast solution may cause some diarrhea. Depending on your individual set of symptoms, you may also receive an intravenous injection of x-ray contrast/dye. Plan on being at [facility] for 30 minutes or longer, depending on the type of exam you are having performed.  This test typically takes 30-45 minutes to complete.  If you have any questions regarding your exam or if you need to reschedule, you may call the CT department at [phone] between the hours of 8:00 am and 5:00 pm, Monday-Friday.  ________________________________________________________________________  Thank you,  Dr. [name]

## 2022-04-07 NOTE — Progress Notes (Signed)
Chief Complaint:   Referring Provider:  [name], MD      ASSESSMENT AND PLAN;   #1. H+ stools with mild anemia. R/O obvious colon ca  #2. A fib on eliquis  Plan: -CT AP with contrast. -CBC, CMP -He wants to hold of colon, if possible. -FU in 4-6 weeks. Then, he will decide regarding colonoscopy.    HPI:    [name] is a 89 y.o. male  With multiple medical problems as below including Afib on eliquis (Nl 55-60%, 09/2021) Accompanied by his wife  With H+ stools Microcytic anemia. Hb 11, MCV 71 Jan 2024 He wants to avoid colonoscopy (d/t age), but willing if absolutely needed  No nausea, vomiting, heartburn, regurgitation, odynophagia or dysphagia.  No significant diarrhea or constipation.  No melena or hematochezia. No unintentional weight loss. No abdominal pain.  Last colon over 39yr ago-does not remember much about that  We had extensive discussion-he does understand that although colonoscopy carries some risk, this is the best test to detect colon cancer and is recommended for heme positive stools.  He is willing to get CT first.      Latest Ref Rng & Units 03/11/2022   10:54 AM 12/05/2021   12:32 PM 10/22/2021   10:24 AM  CBC  WBC 4.0 - 10.5 K/uL 8.8  8.5  10.3   Hemoglobin 13.0 - 17.0 g/dL 11.0  11.0  11.0   Hematocrit 39.0 - 52.0 % 35.1  36.0  36.9   Platelets 150.0 - 400.0 K/uL 312.0  254  605       Latest Ref Rng & Units 03/11/2022   10:54 AM 12/05/2021   12:32 PM 10/30/2021   10:12 AM  CMP  Glucose 70 - 99 mg/dL 121  117  134   BUN 6 - 23 mg/dL '23  20  17   '$ Creatinine 0.40 - 1.50 mg/dL 1.37  1.21  1.25   Sodium 135 - 145 mEq/L 144  141  141   Potassium 3.5 - 5.1 mEq/L 4.6  4.1  4.7   Chloride 96 - 112 mEq/L 104  101  103   CO2 19 - 32 mEq/L '30  28  27   '$ Calcium 8.4 - 10.5 mg/dL 9.8  9.8  9.5   Total Protein 6.0 - 8.3 g/dL 6.9  7.8    Total Bilirubin 0.2 - 1.2 mg/dL 0.8  0.8    Alkaline Phos 39 - 117 U/L 69  65    AST 0 - 37 U/L 12   14    ALT 0 - 53 U/L 9  9      Past Medical History:  Diagnosis Date   Abdominal pain    Acute renal failure (HCC)     resolved   Arthritis    "hands & legs" (11/'10/2014)   Ataxia    Atrial fibrillation (HCC)    Bladder outlet obstruction    Bladder outlet obstruction    BPH (benign prostatic hyperplasia)    CAD (coronary artery disease)    a. CABG IN 1989. b. 01/08/2015 CTO of ost LAD, LIMA to LAD not visualized but assumed patent given myoview finding, occluded SVG to diagonal, 99% mid RCA tx w/ SYNERGY DES 3X28 mm   Constipation    Diabetes mellitus type 2, insulin dependent (HCC)    Epistaxis, recurrent Feb 2017   GERD (gastroesophageal reflux disease)    HTN (hypertension)    Hx of bacterial pneumonia  Hyperlipemia    Kidney stones    Mild aortic stenosis    Mild cognitive impairment 03/05/2021   MMSE 26/30 and 6CIT 9 03/2021 AWV   Pneumonia 04/2019   Rib fractures    left rib fractures being treated with pain medications   Stented coronary artery Nov 2016   RCA DES   Urinary tract infection     Enterococcus    Past Surgical History:  Procedure Laterality Date   [place] N/A 01/08/2015   Procedure: Left Heart Cath and Cors/Grafts Angiography;  Surgeon: Peter M Martinique, MD;  Location: [facility];  Service: Cardiovascular;  Laterality: N/A;   CARDIAC CATHETERIZATION  01/08/2015   Procedure: Coronary Stent Intervention;  Surgeon: Peter M Martinique, MD;  Location: [facility];  Service: Cardiovascular;;   CARPAL TUNNEL RELEASE Right 08/2010   CATARACT EXTRACTION W/ INTRAOCULAR LENS  IMPLANT, BILATERAL Bilateral    CORONARY ANGIOPLASTY  01/08/15   RCA DES   CORONARY ARTERY BYPASS GRAFT  1989   "CABG X 2"   IR THORACENTESIS ASP PLEURAL SPACE W/IMG GUIDE  05/14/2019   JOINT REPLACEMENT     TONSILLECTOMY     TOTAL HIP ARTHROPLASTY Right 2000    Family History  Problem Relation Age of Onset   Brain cancer Father     Throat cancer Brother    Liver disease Neg Hx    Colon cancer Neg Hx    Esophageal cancer Neg Hx     Social History   Tobacco Use   Smoking status: Former    Packs/day: 3.00    Years: 10.00    Total pack years: 30.00    Types: Cigarettes    Quit date: 05/11/1958    Years since quitting: 63.9   Smokeless tobacco: Never  Vaping Use   Vaping Use: Never used  Substance Use Topics   Alcohol use: No   Drug use: No    Current Outpatient Medications  Medication Sig Dispense Refill   apixaban (ELIQUIS) 5 MG TABS tablet Take 1 tablet (5 mg total) by mouth 2 (two) times daily. 60 tablet 2   atorvastatin (LIPITOR) 40 MG tablet Take 1 tablet (40 mg total) by mouth daily at 6 PM. 30 tablet 2   AVODART 0.5 MG capsule Take 0.5 mg by mouth See admin instructions. Take 0.5 mg by mouth every other night- at bedtime     Cholecalciferol (VITAMIN D3 PO) Take 1 capsule by mouth daily.     diltiazem (TIAZAC) 360 MG 24 hr capsule Take 360 mg by mouth daily.     empagliflozin (JARDIANCE) 25 MG TABS tablet Take 1 tablet (25 mg total) by mouth daily before breakfast. 30 tablet    furosemide (LASIX) 40 MG tablet Take 1 tablet (40 mg total) by mouth daily. 90 tablet 3   gabapentin (NEURONTIN) 300 MG capsule Take 2 capsules (600 mg total) by mouth 3 (three) times daily. 180 capsule 5   insulin glargine (LANTUS) 100 UNIT/ML injection Inject 40 Units into the skin every morning.     metFORMIN (GLUCOPHAGE) 500 MG tablet Take 500 mg by mouth 2 (two) times daily with a meal.     metoprolol succinate (TOPROL-XL) 100 MG 24 hr tablet Take 1 tablet (100 mg total) by mouth daily. Take with or immediately following a meal. (Patient taking differently: Take 100 mg by mouth in the morning.) 180 tablet 2   pantoprazole (PROTONIX) 40 MG tablet TAKE  1 TABLET BY MOUTH DAILY AT NOON (Patient taking differently: Take 40 mg by mouth daily in the afternoon.) 90 tablet 3   tamsulosin (FLOMAX) 0.4 MG CAPS capsule Take 0.4 mg by  mouth every other day.     traMADol (ULTRAM) 50 MG tablet Take 1 tablet (50 mg total) by mouth daily as needed (hand pain). 20 tablet 1   TYLENOL 500 MG tablet Take 500-1,000 mg by mouth every 6 (six) hours as needed for mild pain or headache.     No current facility-administered medications for this visit.    Allergies  Allergen Reactions   Septra [Sulfamethoxazole-Trimethoprim] Itching   Cefadroxil Other (See Comments)    Unknown reaction    Ciprofloxacin Other (See Comments)    Unknown action     Erythromycin Other (See Comments)    Upsets the stomach     Review of Systems:  Constitutional: Denies fever, chills, diaphoresis, appetite change and fatigue.  HEENT: Denies photophobia, eye pain, redness, hearing loss, ear pain, congestion, sore throat, rhinorrhea, sneezing, mouth sores, neck pain, neck stiffness and tinnitus.   Respiratory: Denies SOB, DOE, cough, chest tightness,  and wheezing.   Cardiovascular: Denies chest pain, palpitations and leg swelling.  Genitourinary: Denies dysuria, urgency, frequency, hematuria, flank pain and difficulty urinating.  Musculoskeletal: Denies myalgias, back pain, joint swelling, arthralgias and gait problem.  Skin: No rash.  Neurological: Denies dizziness, seizures, syncope, weakness, light-headedness, numbness and headaches.  Hematological: Denies adenopathy. Easy bruising, personal or family bleeding history  Psychiatric/Behavioral: No anxiety or depression     Physical Exam:    Ht '5\' 6"'$  (1.676 m)   Wt 205 lb (93 kg)   BMI 33.09 kg/m  Wt Readings from Last 3 Encounters:  04/07/22 205 lb (93 kg)  03/26/22 203 lb 3.2 oz (92.2 kg)  03/11/22 209 lb (94.8 kg)   Constitutional:  Well-developed, in no acute distress. Psychiatric: Normal mood and affect. Behavior is normal. HEENT: Pupils normal.  Conjunctivae are normal. No scleral icterus. Neck supple.  Cardiovascular: Normal rate, regular rhythm. No edema Pulmonary/chest: Effort  normal and breath sounds normal. No wheezing, rales or rhonchi. Abdominal: Soft, nondistended. Nontender. Bowel sounds active throughout. There are no masses palpable. No hepatomegaly. Rectal: Deferred Neurological: Alert and oriented to person place and time. Skin: Skin is warm and dry. No rashes noted.  Data Reviewed: I have personally reviewed following labs and imaging studies  CBC:    Latest Ref Rng & Units 03/11/2022   10:54 AM 12/05/2021   12:32 PM 10/22/2021   10:24 AM  CBC  WBC 4.0 - 10.5 K/uL 8.8  8.5  10.3   Hemoglobin 13.0 - 17.0 g/dL 11.0  11.0  11.0   Hematocrit 39.0 - 52.0 % 35.1  36.0  36.9   Platelets 150.0 - 400.0 K/uL 312.0  254  605     CMP:    Latest Ref Rng & Units 03/11/2022   10:54 AM 12/05/2021   12:32 PM 10/30/2021   10:12 AM  CMP  Glucose 70 - 99 mg/dL 121  117  134   BUN 6 - 23 mg/dL '23  20  17   '$ Creatinine 0.40 - 1.50 mg/dL 1.37  1.21  1.25   Sodium 135 - 145 mEq/L 144  141  141   Potassium 3.5 - 5.1 mEq/L 4.6  4.1  4.7   Chloride 96 - 112 mEq/L 104  101  103   CO2 19 - 32 mEq/L 30  28  27   Calcium 8.4 - 10.5 mg/dL 9.8  9.8  9.5   Total Protein 6.0 - 8.3 g/dL 6.9  7.8    Total Bilirubin 0.2 - 1.2 mg/dL 0.8  0.8    Alkaline Phos 39 - 117 U/L 69  65    AST 0 - 37 U/L 12  14    ALT 0 - 53 U/L 9  9      GFR: CrCl cannot be calculated (Patient's most recent lab result is older than the maximum 21 days allowed.). Liver Function Tests: No results for input(s): "AST", "ALT", "ALKPHOS", "BILITOT", "PROT", "ALBUMIN" in the last 168 hours. No results for input(s): "LIPASE", "AMYLASE" in the last 168 hours. No results for input(s): "AMMONIA" in the last 168 hours. Coagulation Profile: No results for input(s): "INR", "PROTIME" in the last 168 hours. HbA1C: No results for input(s): "HGBA1C" in the last 72 hours. Lipid Profile: No results for input(s): "CHOL", "HDL", "LDLCALC", "TRIG", "CHOLHDL", "LDLDIRECT" in the last 72 hours. Thyroid Function  Tests: No results for input(s): "TSH", "T4TOTAL", "FREET4", "T3FREE", "THYROIDAB" in the last 72 hours. Anemia Panel: No results for input(s): "VITAMINB12", "FOLATE", "FERRITIN", "TIBC", "IRON", "RETICCTPCT" in the last 72 hours.  Recent Results (from the past 240 hour(s))  Fecal occult blood, imunochemical([facility])     Status: Abnormal   Collection Time: 03/31/22  8:55 AM   Specimen: Stool  Result Value Ref Range Status   Fecal Occult Bld Positive (A) Negative Final      Radiology Studies: No results found.    Carmell Austria, MD 04/07/2022, 10:28 AM  Cc: [name], MD

## 2022-04-15 ENCOUNTER — Encounter: Payer: Self-pay | Admitting: Family Medicine

## 2022-04-15 ENCOUNTER — Ambulatory Visit (INDEPENDENT_AMBULATORY_CARE_PROVIDER_SITE_OTHER): Payer: Medicare Other | Admitting: Family Medicine

## 2022-04-15 VITALS — BP 152/87 | HR 89 | Temp 98.6°F | Ht 66.0 in | Wt 205.6 lb

## 2022-04-15 DIAGNOSIS — R195 Other fecal abnormalities: Secondary | ICD-10-CM | POA: Diagnosis not present

## 2022-04-15 DIAGNOSIS — Z7901 Long term (current) use of anticoagulants: Secondary | ICD-10-CM | POA: Diagnosis not present

## 2022-04-15 DIAGNOSIS — D509 Iron deficiency anemia, unspecified: Secondary | ICD-10-CM

## 2022-04-15 NOTE — Progress Notes (Signed)
Subjective  CC:  Chief Complaint  Patient presents with   Follow-up    HPI: [name] is a 89 y.o. male who presents to the office today to address the problems listed above in the chief complaint. Heme + stool and microcytic anemia; has seen GI and has many questions. Feeling well.  I reviewed GI notes and recent labs. Hgb down to 10.5 from baseline of 11.0 with low MCV and heme + stools; has CT abd/pel set up for next week.  On eloquis for afib.  No signs of BRBPR or melena.  No change in appetite or BM   Assessment  1. Microcytic anemia   2. Heme positive stool   3. Current use of long term anticoagulation      Plan  Reviewed dx and appropriate work up. Given age and multiple comorbidities, his is higher risk. However, he is independent and drives for his wife. Rec proceeding with work up. Will notify GI. Counseling and education given on reasons for testing, risks/beneifts. Can also get clearance from cards. Will need to hold eloquis. I spent a total of 31 minutes for this patient encounter. Time spent included preparation, face-to-face counseling with the patient and coordination of care, review of chart and records, and documentation of the encounter.   Follow up: prn  04/28/2022  No orders of the defined types were placed in this encounter.  No orders of the defined types were placed in this encounter.     I reviewed the patients updated PMH, FH, and SocHx.    Patient Active Problem List   Diagnosis Date Noted   Mild cognitive impairment 03/05/2021    Priority: High   Moderate nonproliferative diabetic retinopathy of left eye ([place]) 06/18/2020    Priority: High   Stage 3b chronic kidney disease ([place]) 05/26/2020    Priority: High   Aortic stenosis, moderate 05/22/2019    Priority: High   Chronic diastolic CHF (congestive heart failure) ([place]) 03/14/2019    Priority: High   Spinal stenosis of lumbar region 02/21/2018    Priority: High   Diabetic  peripheral neuropathy associated with type 2 diabetes mellitus ([place]) 05/05/2017    Priority: High   Atherosclerotic heart disease of native coronary artery without angina pectoris 06/13/2016    Priority: High   CVA (cerebral vascular accident) ([place]) 06/06/2016    Priority: High   Recurrent coronary arteriosclerosis after percutaneous transluminal coronary angioplasty 04/18/2015    Priority: High   CAD S/P PCI- Nov 2016     Priority: High   Current use of long term anticoagulation 02/06/2013    Priority: High   Chronic atrial fibrillation ([place]) 07/30/2011    Priority: High   Hx of CABG x 2 1990     Priority: High   Hypertension associated with diabetes ([place])     Priority: High   Diabetes mellitus with diabetic nephropathy, with long-term current use of insulin (HCC)     Priority: High   Combined hyperlipidemia associated with type 2 diabetes mellitus ([place])     Priority: High   Thoracic aortic aneurysm without rupture ([place]) 05/26/2020    Priority: Medium    Degeneration of lumbar intervertebral disc 02/21/2018    Priority: Medium    Chronic vertigo 03/15/2014    Priority: Medium    Obesity (BMI 30.0-34.9) 01/31/2012    Priority: Medium    Enlarged prostate without lower urinary tract symptoms (luts) 01/04/2011    Priority: Medium    Gastro-esophageal reflux disease  without esophagitis 12/07/2010    Priority: Medium    Epistaxis, recurrent 04/11/2015    Priority: Low   Osteoarthritis, knee 01/31/2012    Priority: Low   Allergic rhinitis 10/28/2011    Priority: Low   Hearing loss 06/08/2011    Priority: Low   Primary osteoarthritis of hand 06/08/2011    Priority: Low   Benign hematuria 08/27/2008    Priority: Low   Osteoarthritis of metacarpophalangeal (MCP) joint of left thumb 01/18/2022   Stable hemispheric central retinal vein occlusion (CRVO) of right eye 07/21/2020   Lower extremity edema 03/14/2019   Current Meds  Medication Sig   apixaban (ELIQUIS) 5 MG TABS  tablet Take 1 tablet (5 mg total) by mouth 2 (two) times daily.   atorvastatin (LIPITOR) 40 MG tablet Take 1 tablet (40 mg total) by mouth daily at 6 PM.   AVODART 0.5 MG capsule Take 0.5 mg by mouth See admin instructions. Take 0.5 mg by mouth every other night- at bedtime   Cholecalciferol (VITAMIN D3 PO) Take 1 capsule by mouth daily.   diltiazem (TIAZAC) 360 MG 24 hr capsule Take 360 mg by mouth daily.   empagliflozin (JARDIANCE) 25 MG TABS tablet Take 1 tablet (25 mg total) by mouth daily before breakfast.   furosemide (LASIX) 40 MG tablet Take 1 tablet (40 mg total) by mouth daily.   gabapentin (NEURONTIN) 300 MG capsule Take 2 capsules (600 mg total) by mouth 3 (three) times daily.   insulin glargine (LANTUS) 100 UNIT/ML injection Inject 40 Units into the skin every morning.   metFORMIN (GLUCOPHAGE) 500 MG tablet Take 500 mg by mouth 2 (two) times daily with a meal.   metoprolol succinate (TOPROL-XL) 100 MG 24 hr tablet Take 1 tablet (100 mg total) by mouth daily. Take with or immediately following a meal. (Patient taking differently: Take 100 mg by mouth in the morning.)   pantoprazole (PROTONIX) 40 MG tablet TAKE 1 TABLET BY MOUTH DAILY AT NOON (Patient taking differently: Take 40 mg by mouth daily in the afternoon.)   tamsulosin (FLOMAX) 0.4 MG CAPS capsule Take 0.4 mg by mouth every other day.   traMADol (ULTRAM) 50 MG tablet Take 1 tablet (50 mg total) by mouth daily as needed (hand pain).   TYLENOL 500 MG tablet Take 500-1,000 mg by mouth every 6 (six) hours as needed for mild pain or headache.    Allergies: Patient is allergic to septra [sulfamethoxazole-trimethoprim], cefadroxil, ciprofloxacin, and erythromycin. Family History: Patient family history includes Brain cancer in his father; Throat cancer in his brother. Social History:  Patient  reports that he quit smoking about 63 years ago. His smoking use included cigarettes. He has a 30.00 pack-year smoking history. He has never  used smokeless tobacco. He reports that he does not drink alcohol and does not use drugs.  Review of Systems: Constitutional: Negative for fever malaise or anorexia Cardiovascular: negative for chest pain Respiratory: negative for SOB or persistent cough Gastrointestinal: negative for abdominal pain  Objective  Vitals: BP (!) 152/87   Pulse 89   Temp 98.6 F (37 C) (Temporal)   Ht 5' 6"$  (1.676 m)   Wt 205 lb 9.6 oz (93.3 kg)   SpO2 94%   BMI 33.18 kg/m  General: no acute distress , A&Ox3 Lab Results  Component Value Date   WBC 11.9 (H) 04/07/2022   HGB 10.5 (L) 04/07/2022   HCT 34.2 (L) 04/07/2022   MCV 71.0 (L) 04/07/2022   PLT 281.0 04/07/2022  Commons side effects, risks, benefits, and alternatives for medications and treatment plan prescribed today were discussed, and the patient expressed understanding of the given instructions. Patient is instructed to call or message via MyChart if he/she has any questions or concerns regarding our treatment plan. No barriers to understanding were identified. We discussed Red Flag symptoms and signs in detail. Patient expressed understanding regarding what to do in case of urgent or emergency type symptoms.  Medication list was reconciled, printed and provided to the patient in AVS. Patient instructions and summary information was reviewed with the patient as documented in the AVS. This note was prepared with assistance of Dragon voice recognition software. Occasional wrong-word or sound-a-like substitutions may have occurred due to the inherent limitations of voice recognition software

## 2022-04-21 ENCOUNTER — Ambulatory Visit (HOSPITAL_COMMUNITY)
Admission: RE | Admit: 2022-04-21 | Discharge: 2022-04-21 | Disposition: A | Discharge status: Home / Self Care | Payer: Medicare Other | Source: Ambulatory Visit | Attending: Gastroenterology | Admitting: Gastroenterology

## 2022-04-21 ENCOUNTER — Encounter (HOSPITAL_COMMUNITY): Payer: Self-pay

## 2022-04-21 DIAGNOSIS — D649 Anemia, unspecified: Secondary | ICD-10-CM | POA: Diagnosis not present

## 2022-04-21 DIAGNOSIS — R195 Other fecal abnormalities: Secondary | ICD-10-CM | POA: Diagnosis not present

## 2022-04-21 DIAGNOSIS — K922 Gastrointestinal hemorrhage, unspecified: Secondary | ICD-10-CM | POA: Diagnosis not present

## 2022-04-21 DIAGNOSIS — N200 Calculus of kidney: Secondary | ICD-10-CM | POA: Diagnosis not present

## 2022-04-21 DIAGNOSIS — I482 Chronic atrial fibrillation, unspecified: Secondary | ICD-10-CM | POA: Diagnosis not present

## 2022-04-21 MED ORDER — SODIUM CHLORIDE (PF) 0.9 % IJ SOLN
INTRAMUSCULAR | Status: AC
  Filled 2022-04-21: qty 50

## 2022-04-21 MED ORDER — IOHEXOL 300 MG/ML  SOLN
100.0000 mL | Freq: Once | INTRAMUSCULAR | Status: AC | PRN
  Administered 2022-04-21: 100 mL via INTRAVENOUS

## 2022-04-21 MED ORDER — IOHEXOL 9 MG/ML PO SOLN
500.0000 mL | ORAL | Status: AC
  Administered 2022-04-21 (×2): 500 mL via ORAL

## 2022-04-21 MED ORDER — IOHEXOL 9 MG/ML PO SOLN
ORAL | Status: AC
  Filled 2022-04-21: qty 1000

## 2022-04-22 ENCOUNTER — Telehealth: Payer: Self-pay | Admitting: Family Medicine

## 2022-04-22 NOTE — Telephone Encounter (Signed)
Pt asking for help to be guided on the [facility] [employer] Glucose Monitor.states he has been unable to check sugars for 2 weeks.  Patient would like education from [place] or provider because he is unable to work machine.

## 2022-04-26 ENCOUNTER — Telehealth: Payer: Self-pay

## 2022-04-26 NOTE — Telephone Encounter (Signed)
Message Received: 2 days ago [name], MD  Gillermina Hu, RN OK with cardiology to hold Eliquis 3 days prior and to proceed with colonoscopy RG

## 2022-04-26 NOTE — Telephone Encounter (Signed)
Left message for pt to call back  °

## 2022-04-26 NOTE — Telephone Encounter (Signed)
PT returning call. Please advise

## 2022-04-27 NOTE — Telephone Encounter (Signed)
Patient daughter is calling back , stated she really need speak with you .Please advise

## 2022-04-27 NOTE — Telephone Encounter (Signed)
Patient daughter giving a call back. Please advise.  Thank you

## 2022-04-27 NOTE — Telephone Encounter (Signed)
Left message for pt to call back  °

## 2022-04-28 ENCOUNTER — Encounter: Payer: Self-pay | Admitting: Gastroenterology

## 2022-04-28 ENCOUNTER — Telehealth: Payer: Self-pay | Admitting: Family Medicine

## 2022-04-28 ENCOUNTER — Ambulatory Visit (INDEPENDENT_AMBULATORY_CARE_PROVIDER_SITE_OTHER): Payer: Medicare Other | Admitting: *Deleted

## 2022-04-28 ENCOUNTER — Other Ambulatory Visit: Payer: Self-pay

## 2022-04-28 DIAGNOSIS — D649 Anemia, unspecified: Secondary | ICD-10-CM

## 2022-04-28 DIAGNOSIS — R195 Other fecal abnormalities: Secondary | ICD-10-CM

## 2022-04-28 NOTE — Chronic Care Management (AMB) (Signed)
Chronic Care Management   CCM RN Visit Note  04/28/2022 Name: [name] MRN: [id number] DOB: January 03, 1929  Subjective: [name] is a 89 y.o. year old male who is a primary care patient of [name], MD. The patient was referred to the Chronic Care Management team for assistance with care management needs subsequent to provider initiation of CCM services and plan of care.    Today's Visit:  Engaged with patient by telephone for follow up visit.        Goals Addressed             This Visit's Progress    CCM (DIABETES) EXPECTED OUTCOME:  MONITOR, SELF-MANAGE AND REDUCE SYMPTOMS OF DIABETES       Current Barriers:  Knowledge Deficits related to Diabetes management Chronic Disease Management support and education needs related to Diabetes, diet No Advanced Directives in place- pt declines information Pt reports he checks CBG once daily with readings in 110-125 range, states drinks unsweetened beverages, does not always follow a special diet, reports has all medications and taking as prescribed  Planned Interventions: Reviewed medications with patient and discussed importance of medication adherence;        Counseled on importance of regular laboratory monitoring as prescribed;        Advised patient, providing education and rationale, to check cbg once daily and record        Review of patient status, including review of consultants reports, relevant laboratory and other test results, and medications completed;       Advised patient to discuss any issues with blood sugar (outside normal parameters)  with provider;      Reinforced importance of exercise Pain assessment completed Reinforced carbohydrate modified diet Reviewed all upcoming scheduled appointments  Symptom Management: Take medications as prescribed   Attend all scheduled provider appointments Call pharmacy for medication refills 3-7 days in advance of running out of medications Perform all self care  activities independently  Perform IADL's (shopping, preparing meals, housekeeping, managing finances) independently Call provider office for new concerns or questions  check blood sugar at prescribed times: once daily check feet daily for cuts, sores or redness enter blood sugar readings and medication or insulin into daily log take the blood sugar log to all doctor visits take the blood sugar meter to all doctor visits trim toenails straight across drink 6 to 8 glasses of water each day fill half of plate with vegetables limit fast food meals to no more than 1 per week manage portion size prepare main meal at home 3 to 5 days each week read food labels for fat, fiber, carbohydrates and portion size Try to exercise some daily- even if just walking for a few minutes at a time  Follow Up Plan: Telephone follow up appointment with care management team member scheduled for:  07/16/22 at 215 pm       CCM (HYPERTENSION) EXPECTED OUTCOME: MONITOR, SELF-MANAGE AND REDUCE SYMPTOMS OF HYPERTENSION       Current Barriers:  Knowledge Deficits related to Hypertension management Chronic Disease Management support and education needs related to Hypertension, diet No Advanced Directives in place- pt declines Pt reports he lives with spouse [name], is independent with all aspects of his care, continues to drive, no recent falls Pt reports he has blood pressure cuff but does not monitor and states " it's usually good" Patient reports he is having colonoscopy on 06/09/22 "for some blood in my stool that was found"  pt states  he has not seen any active bleeding.  Pt reports he is to stop taking Eliquis 3 days before procedure  Planned Interventions: Evaluation of current treatment plan related to hypertension self management and patient's adherence to plan as established by provider;   Reviewed medications with patient and discussed importance of compliance;  Counseled on the importance of exercise  goals with target of 150 minutes per week Discussed plans with patient for ongoing care management follow up and provided patient with direct contact information for care management team; Advised patient, providing education and rationale, to monitor blood pressure daily and record, calling PCP for findings outside established parameters;  Advised patient to discuss any issues with blood pressure, medications with provider; Discussed complications of poorly controlled blood pressure such as heart disease, stroke, circulatory complications, vision complications, kidney impairment, sexual dysfunction;  Reinforced safety precautions Reinforced low sodium diet  Symptom Management: Take medications as prescribed   Attend all scheduled provider appointments Call pharmacy for medication refills 3-7 days in advance of running out of medications Perform all self care activities independently  Perform IADL's (shopping, preparing meals, housekeeping, managing finances) independently Call provider office for new concerns or questions  check blood pressure weekly choose a place to take my blood pressure (home, clinic or office, retail store) write blood pressure results in a log or diary learn about high blood pressure keep a blood pressure log take blood pressure log to all doctor appointments call doctor for signs and symptoms of high blood pressure develop an action plan for high blood pressure keep all doctor appointments take medications for blood pressure exactly as prescribed report new symptoms to your doctor eat more whole grains, fruits and vegetables, lean meats and healthy fats Follow low sodium diet- read food labels  Follow Up Plan: Telephone follow up appointment with care management team member scheduled for:  07/16/22 at 215 pm          Plan:Telephone follow up appointment with care management team member scheduled for:  07/16/22 at 215 pm  Jacqlyn Larsen [facility], BSN RN Case  Manager [place] at [employer] [phone]

## 2022-04-28 NOTE — Telephone Encounter (Signed)
Pt would like a call back with Imaging results, the other office is giving him the runaround and he would like an answer. Please advise.

## 2022-04-28 NOTE — Patient Instructions (Signed)
Please call the care guide team at [phone] if you need to cancel or reschedule your appointment.   If you are experiencing a Mental Health or [place] or need someone to talk to, please call the Suicide and Crisis Lifeline: 988 call the [place] National Suicide Prevention Lifeline: (708) 858-8272 or TTY: 813-140-0918 TTY 859-472-1080) to talk to a trained counselor call 1-800-273-TALK (toll free, 24 hour hotline) go to [facility] [address], [place] [phone]) call 911   Following is a copy of the CCM Program Consent:  CCM service includes personalized support from designated clinical staff supervised by the physician, including individualized plan of care and coordination with other care providers 24/7 contact phone numbers for assistance for urgent and routine care needs. Service will only be billed when office clinical staff spend 20 minutes or more in a month to coordinate care. Only one practitioner may furnish and bill the service in a calendar month. The patient may stop CCM services at amy time (effective at the end of the month) by phone call to the office staff. The patient will be responsible for cost sharing (co-pay) or up to 20% of the service fee (after annual deductible is met)  Following is a copy of your full provider care plan:   Goals Addressed             This Visit's Progress    CCM (DIABETES) EXPECTED OUTCOME:  MONITOR, SELF-MANAGE AND REDUCE SYMPTOMS OF DIABETES       Current Barriers:  Knowledge Deficits related to Diabetes management Chronic Disease Management support and education needs related to Diabetes, diet No Advanced Directives in place- pt declines information Pt reports he checks CBG once daily with readings in 110-125 range, states drinks unsweetened beverages, does not always follow a special diet, reports has all medications and taking as prescribed  Planned Interventions: Reviewed  medications with patient and discussed importance of medication adherence;        Counseled on importance of regular laboratory monitoring as prescribed;        Advised patient, providing education and rationale, to check cbg once daily and record        Review of patient status, including review of consultants reports, relevant laboratory and other test results, and medications completed;       Advised patient to discuss any issues with blood sugar (outside normal parameters)  with provider;      Reinforced importance of exercise Pain assessment completed Reinforced carbohydrate modified diet Reviewed all upcoming scheduled appointments  Symptom Management: Take medications as prescribed   Attend all scheduled provider appointments Call pharmacy for medication refills 3-7 days in advance of running out of medications Perform all self care activities independently  Perform IADL's (shopping, preparing meals, housekeeping, managing finances) independently Call provider office for new concerns or questions  check blood sugar at prescribed times: once daily check feet daily for cuts, sores or redness enter blood sugar readings and medication or insulin into daily log take the blood sugar log to all doctor visits take the blood sugar meter to all doctor visits trim toenails straight across drink 6 to 8 glasses of water each day fill half of plate with vegetables limit fast food meals to no more than 1 per week manage portion size prepare main meal at home 3 to 5 days each week read food labels for fat, fiber, carbohydrates and portion size Try to exercise some daily- even if just walking for  a few minutes at a time  Follow Up Plan: Telephone follow up appointment with care management team member scheduled for:  07/16/22 at 215 pm       CCM (HYPERTENSION) EXPECTED OUTCOME: MONITOR, SELF-MANAGE AND REDUCE SYMPTOMS OF HYPERTENSION       Current Barriers:  Knowledge Deficits related to  Hypertension management Chronic Disease Management support and education needs related to Hypertension, diet No Advanced Directives in place- pt declines Pt reports he lives with spouse Dariusz Juntunen, is independent with all aspects of his care, continues to drive, no recent falls Pt reports he has blood pressure cuff but does not monitor and states " it's usually good" Patient reports he is having colonoscopy on 06/09/22 "for some blood in my stool that was found"  pt states he has not seen any active bleeding.  Pt reports he is to stop taking Eliquis 3 days before procedure  Planned Interventions: Evaluation of current treatment plan related to hypertension self management and patient's adherence to plan as established by provider;   Reviewed medications with patient and discussed importance of compliance;  Counseled on the importance of exercise goals with target of 150 minutes per week Discussed plans with patient for ongoing care management follow up and provided patient with direct contact information for care management team; Advised patient, providing education and rationale, to monitor blood pressure daily and record, calling PCP for findings outside established parameters;  Advised patient to discuss any issues with blood pressure, medications with provider; Discussed complications of poorly controlled blood pressure such as heart disease, stroke, circulatory complications, vision complications, kidney impairment, sexual dysfunction;  Reinforced safety precautions Reinforced low sodium diet  Symptom Management: Take medications as prescribed   Attend all scheduled provider appointments Call pharmacy for medication refills 3-7 days in advance of running out of medications Perform all self care activities independently  Perform IADL's (shopping, preparing meals, housekeeping, managing finances) independently Call provider office for new concerns or questions  check blood pressure  weekly choose a place to take my blood pressure (home, clinic or office, retail store) write blood pressure results in a log or diary learn about high blood pressure keep a blood pressure log take blood pressure log to all doctor appointments call doctor for signs and symptoms of high blood pressure develop an action plan for high blood pressure keep all doctor appointments take medications for blood pressure exactly as prescribed report new symptoms to your doctor eat more whole grains, fruits and vegetables, lean meats and healthy fats Follow low sodium diet- read food labels  Follow Up Plan: Telephone follow up appointment with care management team member scheduled for:  07/16/22 at 215 pm          Patient verbalizes understanding of instructions and care plan provided today and agrees to view in [place]. Active MyChart status and patient understanding of how to access instructions and care plan via MyChart confirmed with patient.     Telephone follow up appointment with care management team member scheduled for:  07/16/22 at 215 pm

## 2022-04-28 NOTE — Telephone Encounter (Signed)
Pt was made aware of Dr. Lyndel Safe recommendations as follows: OK with cardiology to hold Eliquis 3 days prior and to proceed with colonoscopy:  Pt was scheduled for a colonoscopy with Dr. Lyndel Safe on 06/09/2022 at 10:00 AM. Pt made aware. Ambulatory referral to GI was placed in epic.  Prep instructions were sent to pt via my chart. Pt made aware. Pt verbalized understanding with all questions answered.

## 2022-04-29 DIAGNOSIS — Z794 Long term (current) use of insulin: Secondary | ICD-10-CM

## 2022-04-29 DIAGNOSIS — E1159 Type 2 diabetes mellitus with other circulatory complications: Secondary | ICD-10-CM

## 2022-04-29 DIAGNOSIS — I10 Essential (primary) hypertension: Secondary | ICD-10-CM

## 2022-04-30 ENCOUNTER — Encounter: Payer: Self-pay | Admitting: Gastroenterology

## 2022-05-01 ENCOUNTER — Other Ambulatory Visit: Payer: Self-pay

## 2022-05-01 ENCOUNTER — Observation Stay (HOSPITAL_COMMUNITY)
Admission: EM | Admit: 2022-05-01 | Discharge: 2022-05-06 | Disposition: A | Discharge status: Home / Self Care | Payer: Medicare Other | Attending: Internal Medicine | Admitting: Internal Medicine

## 2022-05-01 ENCOUNTER — Emergency Department (HOSPITAL_COMMUNITY): Payer: Medicare Other

## 2022-05-01 ENCOUNTER — Encounter (HOSPITAL_COMMUNITY): Payer: Self-pay

## 2022-05-01 DIAGNOSIS — Z794 Long term (current) use of insulin: Secondary | ICD-10-CM | POA: Diagnosis not present

## 2022-05-01 DIAGNOSIS — Z79899 Other long term (current) drug therapy: Secondary | ICD-10-CM | POA: Diagnosis not present

## 2022-05-01 DIAGNOSIS — R195 Other fecal abnormalities: Secondary | ICD-10-CM | POA: Diagnosis not present

## 2022-05-01 DIAGNOSIS — Z87891 Personal history of nicotine dependence: Secondary | ICD-10-CM | POA: Insufficient documentation

## 2022-05-01 DIAGNOSIS — D122 Benign neoplasm of ascending colon: Secondary | ICD-10-CM | POA: Insufficient documentation

## 2022-05-01 DIAGNOSIS — I959 Hypotension, unspecified: Secondary | ICD-10-CM | POA: Diagnosis not present

## 2022-05-01 DIAGNOSIS — I130 Hypertensive heart and chronic kidney disease with heart failure and stage 1 through stage 4 chronic kidney disease, or unspecified chronic kidney disease: Secondary | ICD-10-CM | POA: Diagnosis not present

## 2022-05-01 DIAGNOSIS — Z7901 Long term (current) use of anticoagulants: Secondary | ICD-10-CM

## 2022-05-01 DIAGNOSIS — E1142 Type 2 diabetes mellitus with diabetic polyneuropathy: Secondary | ICD-10-CM | POA: Diagnosis present

## 2022-05-01 DIAGNOSIS — R001 Bradycardia, unspecified: Secondary | ICD-10-CM | POA: Diagnosis not present

## 2022-05-01 DIAGNOSIS — K922 Gastrointestinal hemorrhage, unspecified: Secondary | ICD-10-CM | POA: Diagnosis not present

## 2022-05-01 DIAGNOSIS — E1140 Type 2 diabetes mellitus with diabetic neuropathy, unspecified: Secondary | ICD-10-CM | POA: Diagnosis not present

## 2022-05-01 DIAGNOSIS — I482 Chronic atrial fibrillation, unspecified: Secondary | ICD-10-CM | POA: Diagnosis not present

## 2022-05-01 DIAGNOSIS — D500 Iron deficiency anemia secondary to blood loss (chronic): Secondary | ICD-10-CM

## 2022-05-01 DIAGNOSIS — D120 Benign neoplasm of cecum: Secondary | ICD-10-CM | POA: Diagnosis not present

## 2022-05-01 DIAGNOSIS — K5730 Diverticulosis of large intestine without perforation or abscess without bleeding: Secondary | ICD-10-CM | POA: Diagnosis not present

## 2022-05-01 DIAGNOSIS — D128 Benign neoplasm of rectum: Secondary | ICD-10-CM | POA: Insufficient documentation

## 2022-05-01 DIAGNOSIS — K5731 Diverticulosis of large intestine without perforation or abscess with bleeding: Secondary | ICD-10-CM | POA: Diagnosis not present

## 2022-05-01 DIAGNOSIS — K449 Diaphragmatic hernia without obstruction or gangrene: Secondary | ICD-10-CM

## 2022-05-01 DIAGNOSIS — R42 Dizziness and giddiness: Secondary | ICD-10-CM | POA: Diagnosis not present

## 2022-05-01 DIAGNOSIS — E1122 Type 2 diabetes mellitus with diabetic chronic kidney disease: Secondary | ICD-10-CM | POA: Diagnosis not present

## 2022-05-01 DIAGNOSIS — D125 Benign neoplasm of sigmoid colon: Secondary | ICD-10-CM | POA: Insufficient documentation

## 2022-05-01 DIAGNOSIS — I2510 Atherosclerotic heart disease of native coronary artery without angina pectoris: Secondary | ICD-10-CM | POA: Diagnosis present

## 2022-05-01 DIAGNOSIS — D649 Anemia, unspecified: Secondary | ICD-10-CM | POA: Diagnosis not present

## 2022-05-01 DIAGNOSIS — Z951 Presence of aortocoronary bypass graft: Secondary | ICD-10-CM | POA: Insufficient documentation

## 2022-05-01 DIAGNOSIS — R739 Hyperglycemia, unspecified: Secondary | ICD-10-CM | POA: Diagnosis not present

## 2022-05-01 DIAGNOSIS — I5032 Chronic diastolic (congestive) heart failure: Secondary | ICD-10-CM | POA: Diagnosis not present

## 2022-05-01 DIAGNOSIS — N1831 Chronic kidney disease, stage 3a: Secondary | ICD-10-CM | POA: Diagnosis present

## 2022-05-01 DIAGNOSIS — D123 Benign neoplasm of transverse colon: Secondary | ICD-10-CM | POA: Insufficient documentation

## 2022-05-01 DIAGNOSIS — N1832 Chronic kidney disease, stage 3b: Secondary | ICD-10-CM | POA: Insufficient documentation

## 2022-05-01 DIAGNOSIS — Z1152 Encounter for screening for COVID-19: Secondary | ICD-10-CM | POA: Diagnosis not present

## 2022-05-01 DIAGNOSIS — N179 Acute kidney failure, unspecified: Secondary | ICD-10-CM | POA: Diagnosis present

## 2022-05-01 DIAGNOSIS — R0602 Shortness of breath: Secondary | ICD-10-CM

## 2022-05-01 DIAGNOSIS — I4891 Unspecified atrial fibrillation: Secondary | ICD-10-CM | POA: Diagnosis not present

## 2022-05-01 DIAGNOSIS — Z7984 Long term (current) use of oral hypoglycemic drugs: Secondary | ICD-10-CM | POA: Insufficient documentation

## 2022-05-01 DIAGNOSIS — G3184 Mild cognitive impairment, so stated: Secondary | ICD-10-CM | POA: Diagnosis present

## 2022-05-01 LAB — CBC WITH DIFFERENTIAL/PLATELET
Abs Immature Granulocytes: 0.05 10*3/uL (ref 0.00–0.07)
Basophils Absolute: 0.1 10*3/uL (ref 0.0–0.1)
Basophils Relative: 1 %
Eosinophils Absolute: 0.3 10*3/uL (ref 0.0–0.5)
Eosinophils Relative: 3 %
HCT: 33 % — ABNORMAL LOW (ref 39.0–52.0)
Hemoglobin: 9.4 g/dL — ABNORMAL LOW (ref 13.0–17.0)
Immature Granulocytes: 1 %
Lymphocytes Relative: 17 %
Lymphs Abs: 1.6 10*3/uL (ref 0.7–4.0)
MCH: 21.7 pg — ABNORMAL LOW (ref 26.0–34.0)
MCHC: 28.5 g/dL — ABNORMAL LOW (ref 30.0–36.0)
MCV: 76.2 fL — ABNORMAL LOW (ref 80.0–100.0)
Monocytes Absolute: 0.7 10*3/uL (ref 0.1–1.0)
Monocytes Relative: 7 %
Neutro Abs: 6.7 10*3/uL (ref 1.7–7.7)
Neutrophils Relative %: 71 %
Platelets: 242 10*3/uL (ref 150–400)
RBC: 4.33 MIL/uL (ref 4.22–5.81)
RDW: 19.3 % — ABNORMAL HIGH (ref 11.5–15.5)
WBC: 9.4 10*3/uL (ref 4.0–10.5)
nRBC: 0 % (ref 0.0–0.2)

## 2022-05-01 NOTE — ED Triage Notes (Signed)
Patient brought in by EMS for SOB, patient was walking and became SOB/dizzy and had to have wife help him sit down. Patient states he continued to have those symptoms after sitting and wanted to be checked out. Does not normally wear oxygen but EMS placed him on 2L for comfort.

## 2022-05-01 NOTE — ED Provider Notes (Signed)
[facility] Provider Note  CSN: [id number] Arrival date & time: 05/01/22 2251  Chief Complaint(s) Shortness of Breath  HPI [name] is a 89 y.o. male {Add pertinent medical, surgical, social history, OB history to HPI:1}    Shortness of Breath   Past Medical History Past Medical History:  Diagnosis Date   Abdominal pain    Acute renal failure ([place])     resolved   Arthritis    "hands & legs" (11/'10/2014)   Ataxia    Atrial fibrillation (H[facility])    Bladder outlet obstruction    Bladder outlet obstruction    BPH (benign prostatic hyperplasia)    CAD (coronary artery disease)    a. CABG IN 1989. b. 01/08/2015 CTO of ost LAD, LIMA to LAD not visualized but assumed patent given myoview finding, occluded SVG to diagonal, 99% mid RCA tx w/ SYNERGY DES 3X28 mm   Constipation    Diabetes mellitus type 2, insulin dependent ([place])    Epistaxis, recurrent Feb 2017   GERD (gastroesophageal reflux disease)    HTN (hypertension)    Hx of bacterial pneumonia    Hyperlipemia    Kidney stones    Mild aortic stenosis    Mild cognitive impairment 03/05/2021   MMSE 26/30 and 6CIT 9 03/2021 AWV   Pneumonia 04/2019   Rib fractures    left rib fractures being treated with pain medications   Stented coronary artery Nov 2016   RCA DES   Urinary tract infection     Enterococcus   Patient Active Problem List   Diagnosis Date Noted   Osteoarthritis of metacarpophalangeal (MCP) joint of left thumb 01/18/2022   Mild cognitive impairment 03/05/2021   Stable hemispheric central retinal vein occlusion (CRVO) of right eye 07/21/2020   Moderate nonproliferative diabetic retinopathy of left eye ([place]) 06/18/2020   Thoracic aortic aneurysm without rupture ([place]) 05/26/2020   Stage 3b chronic kidney disease ([place]) 05/26/2020   Aortic stenosis, moderate 05/22/2019   Chronic diastolic CHF (congestive heart failure) ([place]) 03/14/2019   Lower extremity edema  03/14/2019   Spinal stenosis of lumbar region 02/21/2018   Degeneration of lumbar intervertebral disc 02/21/2018   Diabetic peripheral neuropathy associated with type 2 diabetes mellitus ([place]) 05/05/2017   Atherosclerotic heart disease of native coronary artery without angina pectoris 06/13/2016   CVA (cerebral vascular accident) ([place]) 06/06/2016   Recurrent coronary arteriosclerosis after percutaneous transluminal coronary angioplasty 04/18/2015   Epistaxis, recurrent 04/11/2015   CAD S/P PCI- Nov 2016    Chronic vertigo 03/15/2014   Current use of long term anticoagulation 02/06/2013   Obesity (BMI 30.0-34.9) 01/31/2012   Osteoarthritis, knee 01/31/2012   Allergic rhinitis 10/28/2011   Chronic atrial fibrillation ([place]) 07/30/2011   Hearing loss 06/08/2011   Primary osteoarthritis of hand 06/08/2011   Enlarged prostate without lower urinary tract symptoms (luts) 01/04/2011   Gastro-esophageal reflux disease without esophagitis 12/07/2010   Hx of CABG x 2 1990    Hypertension associated with diabetes ([place])    Diabetes mellitus with diabetic nephropathy, with long-term current use of insulin ([place])    Combined hyperlipidemia associated with type 2 diabetes mellitus ([place])    Benign hematuria 08/27/2008   Home Medication(s) Prior to Admission medications   Medication Sig Start Date End Date Taking? Authorizing Provider  apixaban (ELIQUIS) 5 MG TABS tablet Take 1 tablet (5 mg total) by mouth 2 (two) times daily. 06/08/16   Reyne Dumas, MD  atorvastatin (LIPITOR) 40  MG tablet Take 1 tablet (40 mg total) by mouth daily at 6 PM. 06/08/16   Reyne Dumas, MD  AVODART 0.5 MG capsule Take 0.5 mg by mouth See admin instructions. Take 0.5 mg by mouth every other night- at bedtime 07/20/11   [provider]  Cholecalciferol (VITAMIN D3 PO) Take 1 capsule by mouth daily.    [provider]  diltiazem (TIAZAC) 360 MG 24 hr capsule Take 360 mg by mouth daily. 11/08/19   [provider]  empagliflozin (JARDIANCE) 25 MG TABS tablet Take 1 tablet (25 mg total) by mouth daily before breakfast. 09/03/20   [place], Peter M, MD  furosemide (LASIX) 40 MG tablet Take 1 tablet (40 mg total) by mouth daily. 10/23/21   [place], Peter M, MD  gabapentin (NEURONTIN) 300 MG capsule Take 2 capsules (600 mg total) by mouth 3 (three) times daily. 03/11/22   [name], MD  insulin glargine (LANTUS) 100 UNIT/ML injection Inject 40 Units into the skin every morning.    [provider]  metFORMIN (GLUCOPHAGE) 500 MG tablet Take 500 mg by mouth 2 (two) times daily with a meal.    [provider]  metoprolol succinate (TOPROL-XL) 100 MG 24 hr tablet Take 1 tablet (100 mg total) by mouth daily. Take with or immediately following a meal. Patient taking differently: Take 100 mg by mouth in the morning. 09/09/21   [place], Peter M, MD  pantoprazole (PROTONIX) 40 MG tablet TAKE 1 TABLET BY MOUTH DAILY AT NOON Patient taking differently: Take 40 mg by mouth daily in the afternoon. 05/25/21   [place], Peter M, MD  tamsulosin (FLOMAX) 0.4 MG CAPS capsule Take 0.4 mg by mouth every other day. 03/19/21   [provider]  traMADol (ULTRAM) 50 MG tablet Take 1 tablet (50 mg total) by mouth daily as needed (hand pain). 01/18/22   [name], MD  TYLENOL 500 MG tablet Take 500-1,000 mg by mouth every 6 (six) hours as needed for mild pain or headache.    [provider]                                                                                                                                    Allergies Septra [sulfamethoxazole-trimethoprim], Cefadroxil, Ciprofloxacin, and Erythromycin  Review of Systems Review of Systems  Respiratory:  Positive for shortness of breath.    As noted in HPI  Physical Exam Vital Signs  I have reviewed the triage vital signs BP (!) 114/46   Pulse (!) 25   Ht '5\' 6"'$  (1.676 m)   Wt 93 kg   SpO2 100%   BMI 33.09 kg/m   *** Physical Exam  ED Results and Treatments Labs (all labs ordered are listed, but only abnormal results are displayed) Labs Reviewed - No data to display  EKG  EKG Interpretation  Date/Time:    Ventricular Rate:    PR Interval:    QRS Duration:   QT Interval:    QTC Calculation:   R Axis:     Text Interpretation:         Radiology No results found.  Medications Ordered in ED Medications - No data to display                                                                                                                                   Procedures Procedures  (including critical care time)  Medical Decision Making / ED Course  Click here for ABCD2, HEART and other calculators  Medical Decision Making         Final Clinical Impression(s) / ED Diagnoses Final diagnoses:  None    {Document critical care time when appropriate:1}  {Document review of labs and clinical decision tools ie heart score, Chads2Vasc2 etc:1}  {Document your independent review of radiology images, and any outside records:1} {Document your discussion with family members, caretakers, and with consultants:1} {Document social determinants of health affecting pt's care:1} {Document your decision making why or why not admission, treatments were needed:1} This chart was dictated using voice recognition software.  Despite best efforts to proofread,  errors can occur which can change the documentation meaning.

## 2022-05-02 ENCOUNTER — Emergency Department (HOSPITAL_COMMUNITY): Payer: Medicare Other

## 2022-05-02 ENCOUNTER — Observation Stay (HOSPITAL_BASED_OUTPATIENT_CLINIC_OR_DEPARTMENT_OTHER): Payer: Medicare Other

## 2022-05-02 DIAGNOSIS — N1832 Chronic kidney disease, stage 3b: Secondary | ICD-10-CM | POA: Diagnosis not present

## 2022-05-02 DIAGNOSIS — R55 Syncope and collapse: Secondary | ICD-10-CM

## 2022-05-02 DIAGNOSIS — I482 Chronic atrial fibrillation, unspecified: Secondary | ICD-10-CM | POA: Diagnosis not present

## 2022-05-02 DIAGNOSIS — I2510 Atherosclerotic heart disease of native coronary artery without angina pectoris: Secondary | ICD-10-CM | POA: Diagnosis not present

## 2022-05-02 DIAGNOSIS — R001 Bradycardia, unspecified: Secondary | ICD-10-CM | POA: Diagnosis present

## 2022-05-02 DIAGNOSIS — E1142 Type 2 diabetes mellitus with diabetic polyneuropathy: Secondary | ICD-10-CM

## 2022-05-02 DIAGNOSIS — N179 Acute kidney failure, unspecified: Secondary | ICD-10-CM | POA: Diagnosis present

## 2022-05-02 DIAGNOSIS — R0602 Shortness of breath: Secondary | ICD-10-CM | POA: Diagnosis not present

## 2022-05-02 DIAGNOSIS — G3184 Mild cognitive impairment, so stated: Secondary | ICD-10-CM | POA: Diagnosis not present

## 2022-05-02 DIAGNOSIS — K922 Gastrointestinal hemorrhage, unspecified: Secondary | ICD-10-CM | POA: Diagnosis not present

## 2022-05-02 DIAGNOSIS — I5032 Chronic diastolic (congestive) heart failure: Secondary | ICD-10-CM

## 2022-05-02 DIAGNOSIS — R42 Dizziness and giddiness: Secondary | ICD-10-CM | POA: Diagnosis not present

## 2022-05-02 LAB — ECHOCARDIOGRAM COMPLETE
AV Mean grad: 18.7 mmHg
AV Peak grad: 26.4 mmHg
AV Vena cont: 0.3 cm
Ao pk vel: 2.57 m/s
Area-P 1/2: 3.75 cm2
Height: 66 in
MV M vel: 2.54 m/s
MV Peak grad: 25.7 mmHg
S' Lateral: 3.7 cm
Weight: 3280.44 oz

## 2022-05-02 LAB — FOLATE: Folate: 14.9 ng/mL (ref >5.9)

## 2022-05-02 LAB — HEMOGLOBIN AND HEMATOCRIT, BLOOD
HCT: 31.4 % — ABNORMAL LOW (ref 39.0–52.0)
Hemoglobin: 9.2 g/dL — ABNORMAL LOW (ref 13.0–17.0)

## 2022-05-02 LAB — RESP PANEL BY RT-PCR (RSV, FLU A&B, COVID)  RVPGX2
Influenza A by PCR: NEGATIVE
Influenza B by PCR: NEGATIVE
Resp Syncytial Virus by PCR: NEGATIVE
SARS Coronavirus 2 by RT PCR: NEGATIVE

## 2022-05-02 LAB — COMPREHENSIVE METABOLIC PANEL
ALT: 12 U/L (ref 0–44)
AST: 16 U/L (ref 15–41)
Albumin: 3.5 g/dL (ref 3.5–5.0)
Alkaline Phosphatase: 51 U/L (ref 38–126)
Anion gap: 11 (ref 5–15)
BUN: 25 mg/dL — ABNORMAL HIGH (ref 8–23)
CO2: 27 mmol/L (ref 22–32)
Calcium: 8.9 mg/dL (ref 8.9–10.3)
Chloride: 101 mmol/L (ref 98–111)
Creatinine, Ser: 1.61 mg/dL — ABNORMAL HIGH (ref 0.61–1.24)
GFR, Estimated: 40 mL/min — ABNORMAL LOW (ref >60)
Glucose, Bld: 219 mg/dL — ABNORMAL HIGH (ref 70–99)
Potassium: 4 mmol/L (ref 3.5–5.1)
Sodium: 139 mmol/L (ref 135–145)
Total Bilirubin: 0.8 mg/dL (ref 0.3–1.2)
Total Protein: 6.9 g/dL (ref 6.5–8.1)

## 2022-05-02 LAB — BRAIN NATRIURETIC PEPTIDE: B Natriuretic Peptide: 317.3 pg/mL — ABNORMAL HIGH (ref 0.0–100.0)

## 2022-05-02 LAB — BLOOD GAS, VENOUS
Acid-Base Excess: 5.6 mmol/L — ABNORMAL HIGH (ref 0.0–2.0)
Bicarbonate: 32.8 mmol/L — ABNORMAL HIGH (ref 20.0–28.0)
O2 Saturation: 25.3 %
Patient temperature: 37
pCO2, Ven: 58 mmHg (ref 44–60)
pH, Ven: 7.36 (ref 7.25–7.43)
pO2, Ven: <31 mmHg — CL (ref 32–45)

## 2022-05-02 LAB — RETICULOCYTES
Immature Retic Fract: 21.9 % — ABNORMAL HIGH (ref 2.3–15.9)
RBC.: 4.27 MIL/uL (ref 4.22–5.81)
Retic Count, Absolute: 59.4 10*3/uL (ref 19.0–186.0)
Retic Ct Pct: 1.4 % (ref 0.4–3.1)

## 2022-05-02 LAB — TROPONIN I (HIGH SENSITIVITY)
Troponin I (High Sensitivity): 10 ng/L (ref <18)
Troponin I (High Sensitivity): 9 ng/L (ref <18)

## 2022-05-02 LAB — ABO/RH: ABO/RH(D): A NEG

## 2022-05-02 LAB — IRON AND TIBC
Iron: 24 ug/dL — ABNORMAL LOW (ref 45–182)
Saturation Ratios: 6 % — ABNORMAL LOW (ref 17.9–39.5)
TIBC: 409 ug/dL (ref 250–450)
UIBC: 385 ug/dL

## 2022-05-02 LAB — TYPE AND SCREEN
ABO/RH(D): A NEG
Antibody Screen: NEGATIVE

## 2022-05-02 LAB — GLUCOSE, CAPILLARY
Glucose-Capillary: 246 mg/dL — ABNORMAL HIGH (ref 70–99)
Glucose-Capillary: 148 mg/dL — ABNORMAL HIGH (ref 70–99)
Glucose-Capillary: 75 mg/dL (ref 70–99)
Glucose-Capillary: 103 mg/dL — ABNORMAL HIGH (ref 70–99)

## 2022-05-02 LAB — FERRITIN: Ferritin: 35 ng/mL (ref 24–336)

## 2022-05-02 LAB — VITAMIN B12: Vitamin B-12: 404 pg/mL (ref 180–914)

## 2022-05-02 MED ORDER — ATORVASTATIN CALCIUM 40 MG PO TABS
40.0000 mg | ORAL_TABLET | Freq: Every day | ORAL | Status: DC
  Administered 2022-05-02 – 2022-05-05 (×4): 40 mg via ORAL
  Filled 2022-05-02 (×4): qty 1

## 2022-05-02 MED ORDER — ACETAMINOPHEN 650 MG RE SUPP: 650.0000 mg | Freq: Four times a day (QID) | RECTAL | Status: DC | PRN

## 2022-05-02 MED ORDER — ONDANSETRON HCL 4 MG/2ML IJ SOLN: 4.0000 mg | Freq: Four times a day (QID) | INTRAMUSCULAR | Status: DC | PRN

## 2022-05-02 MED ORDER — INSULIN ASPART 100 UNIT/ML IJ SOLN
0.0000 [IU] | Freq: Three times a day (TID) | INTRAMUSCULAR | Status: DC
  Administered 2022-05-02: 2 [IU] via SUBCUTANEOUS
  Administered 2022-05-02: 5 [IU] via SUBCUTANEOUS
  Administered 2022-05-03: 2 [IU] via SUBCUTANEOUS
  Administered 2022-05-04: 3 [IU] via SUBCUTANEOUS
  Administered 2022-05-05: 2 [IU] via SUBCUTANEOUS
  Administered 2022-05-06: 5 [IU] via SUBCUTANEOUS
  Administered 2022-05-06: 3 [IU] via SUBCUTANEOUS

## 2022-05-02 MED ORDER — METOPROLOL TARTRATE 25 MG PO TABS
25.0000 mg | ORAL_TABLET | Freq: Two times a day (BID) | ORAL | Status: DC
  Administered 2022-05-02 – 2022-05-03 (×4): 25 mg via ORAL
  Filled 2022-05-02 (×4): qty 1

## 2022-05-02 MED ORDER — ACETAMINOPHEN 325 MG PO TABS: 650.0000 mg | ORAL_TABLET | Freq: Four times a day (QID) | ORAL | Status: DC | PRN

## 2022-05-02 MED ORDER — PANTOPRAZOLE SODIUM 40 MG PO TBEC
40.0000 mg | DELAYED_RELEASE_TABLET | Freq: Every day | ORAL | Status: DC
  Administered 2022-05-02 – 2022-05-05 (×4): 40 mg via ORAL
  Filled 2022-05-02 (×4): qty 1

## 2022-05-02 MED ORDER — INSULIN GLARGINE-YFGN 100 UNIT/ML ~~LOC~~ SOLN
25.0000 [IU] | Freq: Every day | SUBCUTANEOUS | Status: DC
  Administered 2022-05-02 – 2022-05-06 (×4): 25 [IU] via SUBCUTANEOUS
  Filled 2022-05-02 (×5): qty 0.25

## 2022-05-02 MED ORDER — ONDANSETRON HCL 4 MG PO TABS: 4.0000 mg | ORAL_TABLET | Freq: Four times a day (QID) | ORAL | Status: DC | PRN

## 2022-05-02 MED ORDER — PERFLUTREN LIPID MICROSPHERE
1.0000 mL | INTRAVENOUS | Status: AC | PRN
  Administered 2022-05-02: 2 mL via INTRAVENOUS

## 2022-05-02 MED ORDER — VITAMIN D 25 MCG (1000 UNIT) PO TABS
2000.0000 [IU] | ORAL_TABLET | Freq: Every day | ORAL | Status: DC
  Administered 2022-05-02 – 2022-05-06 (×5): 2000 [IU] via ORAL
  Filled 2022-05-02 (×5): qty 2

## 2022-05-02 MED ORDER — DUTASTERIDE 0.5 MG PO CAPS
0.5000 mg | ORAL_CAPSULE | ORAL | Status: DC
  Administered 2022-05-02 – 2022-05-06 (×3): 0.5 mg via ORAL
  Filled 2022-05-02 (×3): qty 1

## 2022-05-02 MED ORDER — INSULIN ASPART 100 UNIT/ML IJ SOLN
0.0000 [IU] | Freq: Every day | INTRAMUSCULAR | Status: DC
  Administered 2022-05-05: 2 [IU] via SUBCUTANEOUS

## 2022-05-02 MED ORDER — APIXABAN 5 MG PO TABS: 5.0000 mg | ORAL_TABLET | Freq: Two times a day (BID) | ORAL | Status: DC

## 2022-05-02 NOTE — Consult Note (Signed)
Cardiology Consultation   Patient ID: [name] MRN: [id number]; DOB: 09/29/1928  Admit date: 05/01/2022 Date of Consult: 05/02/2022  PCP:  Jeffrey Arnt, MD   [facility] Cardiologist:  Jeffrey Martinique, MD        Patient Profile:   [name] is a 89 y.o. male with a hx of  CAD status post CABG in 1990 with subsequent DES to RCA in 2006, permanent atrial fibrillation, under chronic diastolic heart failure, moderate aortic stenosis, T2DM, CVA, hypertension, carotid artery disease, hyperlipidemia who is being seen 05/02/2022 for the evaluation of bradycardia at the request of Dr [name].  History of Present Illness:   [name] is a 89 year old male with a history of CAD status post CABG in 1990 with subsequent DES to RCA in 2006, permanent atrial fibrillation, under chronic diastolic heart failure, moderate aortic stenosis, T2DM, CVA, hypertension, carotid artery disease, hyperlipidemia we are consulted for evaluation of bradycardia.  He follows with Dr. Martinique, last seen 12/2021.  Has been on metoprolol and diltiazem for rate control of his atrial fibrillation, and is on Eliquis for anticoagulation.  Takes Lasix 40 mg daily for chronic diastolic heart failure.  Most recent echocardiogram 10/10/2021 showed EF 55 to 60%, interventricular septum flattening in systole and diastole consistent with RV pressure and volume overload, mild RV systolic dysfunction and mild enlargement, mild left atrial enlargement, mild to moderate aortic stenosis.  He presented to ED with complaints of dizziness and presyncope after standing.  Reported had been having milder symptoms of lightheadedness for weeks.  Has been having issues with ongoing GI bleeding and progressive blood loss anemia, was scheduled for outpatient colonoscopy.  On EMS arrival, SBP in 90s and heart rate was in 40s to 60s.  On arrival to ED, initial vital signs notable for BP 114/46, pulse 65, SpO2 100% on 2 L.  Labs  notable for creatinine 1.61 (increased from 1.27 on 04/07/2022), sodium 139, potassium 4.0, normal LFTs, BNP 317, troponin 10 > 9, hemoglobin 9.4, WBC 9.4, platelets 242.  Chest x-ray with no active disease.  EKG shows atrial fibrillation, rate 59.   Past Medical History:  Diagnosis Date   Abdominal pain    Acute renal failure ([place])     resolved   Arthritis    "hands & legs" (11/'10/2014)   Ataxia    Atrial fibrillation (HCC)    Bladder outlet obstruction    Bladder outlet obstruction    BPH (benign prostatic hyperplasia)    CAD (coronary artery disease)    a. CABG IN 1989. b. 01/08/2015 CTO of ost LAD, LIMA to LAD not visualized but assumed patent given myoview finding, occluded SVG to diagonal, 99% mid RCA tx w/ SYNERGY DES 3X28 mm   Constipation    Diabetes mellitus type 2, insulin dependent ([place])    Epistaxis, recurrent Feb 2017   GERD (gastroesophageal reflux disease)    HTN (hypertension)    Hx of bacterial pneumonia    Hyperlipemia    Kidney stones    Mild aortic stenosis    Mild cognitive impairment 03/05/2021   MMSE 26/30 and 6CIT 9 03/2021 AWV   Pneumonia 04/2019   Rib fractures    left rib fractures being treated with pain medications   Stented coronary artery Nov 2016   RCA DES   Urinary tract infection     Enterococcus    Past Surgical History:  Procedure Laterality Date   [place]  N/A 01/08/2015   Procedure: Left Heart Cath and Cors/Grafts Angiography;  Surgeon: Jeffrey M Martinique, MD;  Location: [facility];  Service: Cardiovascular;  Laterality: N/A;   CARDIAC CATHETERIZATION  01/08/2015   Procedure: Coronary Stent Intervention;  Surgeon: Jeffrey M Martinique, MD;  Location: [facility];  Service: Cardiovascular;;   CARPAL TUNNEL RELEASE Right 08/2010   CATARACT EXTRACTION W/ INTRAOCULAR LENS  IMPLANT, BILATERAL Bilateral    CORONARY ANGIOPLASTY  01/08/15   RCA DES   CORONARY ARTERY BYPASS GRAFT  1989   "CABG X  2"   IR THORACENTESIS ASP PLEURAL SPACE W/IMG GUIDE  05/14/2019   JOINT REPLACEMENT     TONSILLECTOMY     TOTAL HIP ARTHROPLASTY Right 2000      Inpatient Medications: Scheduled Meds:  insulin aspart  0-15 Units Subcutaneous TID WC   insulin aspart  0-5 Units Subcutaneous QHS   insulin glargine-yfgn  25 Units Subcutaneous Daily   Continuous Infusions:  PRN Meds: acetaminophen **OR** acetaminophen, ondansetron **OR** ondansetron (ZOFRAN) IV  Allergies:    Allergies  Allergen Reactions   Septra [Sulfamethoxazole-Trimethoprim] Itching   Cefadroxil Other (See Comments)    Unknown reaction    Ciprofloxacin Other (See Comments)    Unknown action     Erythromycin Other (See Comments)    Upsets the stomach     Social History:   Social History   Socioeconomic History   Marital status: Married    Spouse name: Not on file   Number of children: 3   Years of education: Not on file   Highest education level: Not on file  Occupational History   Occupation: Agricultural consultant   Occupation: retired  Tobacco Use   Smoking status: Former    Packs/day: 3.00    Years: 10.00    Total pack years: 30.00    Types: Cigarettes    Quit date: 05/11/1958    Years since quitting: 64.0   Smokeless tobacco: Never  Vaping Use   Vaping Use: Never used  Substance and Sexual Activity   Alcohol use: No   Drug use: No   Sexual activity: Not Currently  Other Topics Concern   Not on file  Social History Narrative   Previously lived in [place], [place] currently living in [place], [employer] Determinants of Health   Financial Resource Strain: [place]  (03/11/2022)   Overall Financial Resource Strain (CARDIA)    Difficulty of Paying Living Expenses: Not hard at all  Food Insecurity: No Food Insecurity (05/02/2022)   Hunger Vital Sign    Worried About Running Out of Food in the Last Year: Never true    [name] in the Last Year: Never true  Transportation Needs: No  Transportation Needs (05/02/2022)   PRAPARE - Hydrologist (Medical): No    Lack of Transportation (Non-Medical): No  Physical Activity: Inactive (03/11/2022)   Exercise Vital Sign    Days of Exercise per Week: 0 days    Minutes of Exercise per Session: 0 min  Stress: No Stress Concern Present (03/11/2022)   [place]    Feeling of Stress : Not at all  Social Connections: Moderately Isolated (03/11/2022)   Social Connection and Isolation Panel [NHANES]    Frequency of Communication with Friends and Family: More than three times a week    Frequency of Social Gatherings with Friends and  Family: More than three times a week    Attends Religious Services: Never    Active Member of Clubs or Organizations: No    Attends Archivist Meetings: Never    Marital Status: Married  Human resources officer Violence: Not At Risk (05/02/2022)   Humiliation, Afraid, Rape, and Kick questionnaire    Fear of Current or Ex-Partner: No    Emotionally Abused: No    Physically Abused: No    Sexually Abused: No    Family History:    Family History  Problem Relation Age of Onset   Brain cancer Father    Throat cancer Brother    Liver disease Neg Hx    Colon cancer Neg Hx    Esophageal cancer Neg Hx      ROS:  Please see the history of present illness.   All other ROS reviewed and negative.     Physical Exam/Data:   Vitals:   05/02/22 0330 05/02/22 0400 05/02/22 0430 05/02/22 0522  BP: (!) 129/103 (!) 156/98 (!) 168/107 (!) 151/81  Pulse: 74 (!) 120 83 76  Resp: (!) 23 (!) '22 19 19  '$ Temp:    97.6 F (36.4 C)  TempSrc:    Oral  SpO2: 100% 100% 100% 100%  Weight:      Height:        Intake/Output Summary (Last 24 hours) at 05/02/2022 0846 Last data filed at 05/02/2022 0830 Gross per 24 hour  Intake 240 ml  Output --  Net 240 ml      05/01/2022   11:05 PM 04/15/2022   10:11 AM 04/07/2022   10:19 AM   Last 3 Weights  Weight (lbs) 205 lb 0.4 oz 205 lb 9.6 oz 205 lb  Weight (kg) 93 kg 93.26 kg 92.987 kg     Body mass index is 33.09 kg/m.  General:   in no acute distress HEENT: normal Neck: minimal JVD Cardiac:  irregular, normal rate; 2/6 systolic murmur Lungs:  clear to auscultation bilaterally Abd: soft, nontender Ext: no edema Musculoskeletal:  No deformities Skin: warm and dry  Neuro:   no focal abnormalities noted Psych:  Normal affect   EKG:  The EKG was personally reviewed and demonstrates:  atrial fibrillation, rate 59. Telemetry:  Telemetry was personally reviewed and demonstrates:  Afib rates 80-90s  Relevant CV Studies:   Laboratory Data:  High Sensitivity Troponin:   Recent Labs  Lab 05/01/22 2340 05/02/22 0229  TROPONINIHS 10 9     Chemistry Recent Labs  Lab 05/01/22 2340  NA 139  K 4.0  CL 101  CO2 27  GLUCOSE 219*  BUN 25*  CREATININE 1.61*  CALCIUM 8.9  GFRNONAA 40*  ANIONGAP 11    Recent Labs  Lab 05/01/22 2340  PROT 6.9  ALBUMIN 3.5  AST 16  ALT 12  ALKPHOS 51  BILITOT 0.8   Lipids No results for input(s): "CHOL", "TRIG", "HDL", "LABVLDL", "LDLCALC", "CHOLHDL" in the last 168 hours.  Hematology Recent Labs  Lab 05/01/22 2340 05/02/22 0640  WBC 9.4  --   RBC 4.33 4.27  HGB 9.4*  --   HCT 33.0*  --   MCV 76.2*  --   MCH 21.7*  --   MCHC 28.5*  --   RDW 19.3*  --   PLT 242  --    Thyroid No results for input(s): "TSH", "FREET4" in the last 168 hours.  BNP Recent Labs  Lab 05/01/22 2340  BNP 317.3*  DDimer No results for input(s): "DDIMER" in the last 168 hours.   Radiology/Studies:  DG Chest Port 1 View  Result Date: 05/02/2022 CLINICAL DATA:  Shortness of breath EXAM: PORTABLE CHEST 1 VIEW COMPARISON:  12/05/2021 FINDINGS: Prior CABG. Cardiomegaly. No confluent airspace opacities, effusions or edema. No acute bony abnormality. IMPRESSION: Cardiomegaly.  No active disease. Electronically Signed   By: Rolm Baptise M.D.   On: 05/02/2022 00:36     Assessment and Plan:   Atrial fibrillation/bradycardia: Has permanent atrial fibrillation, on Eliquis.  Takes Toprol-XL 100 mg daily and diltiazem 360 mg daily at home.  Heart rate initially 40s to 60s in A-fib on presentation and SBP down to 90s -Metoprolol and diltiazem were held.  BP/heart rate have improved, will add back low-dose metoprolol -Holding Eliquis in setting of GI bleed  Presyncope: Likely multifactorial with GI bleeding, dehydration, and bradycardia/hypotension from AV nodal blockers all contributing.  Metoprolol and diltiazem held as above, his BP/HR have improved, adding back low-dose metoprolol.  Hold home Lasix  CAD: status post CABG in 1990 with subsequent DES to RCA in 2006.  No anginal symptoms. Troponins negative. -Anticoagulation held given suspected GI bleed.  While anticoagulation held, would recommend starting ASA 81 mg daily once OK per GI  GI bleed: Has had progressive mild anemia, had planned for outpatient colonoscopy.  FOBT positive.  GI consulted  AKI: Baseline creatinine 1.2-1.3, up to 1.6 on admission.  Hold home Lasix  Chronic diastolic heart failure: Appears euvolemic.  Hold home Lasix given AKI as above.  Will check echo.  Aortic stenosis: mild to moderate on most recent echo 09/2021.  Will update echo.   For questions or updates, please contact [place] Please consult [email or web address] for contact info under    Signed, Donato Heinz, MD  05/02/2022 8:46 AM

## 2022-05-02 NOTE — Assessment & Plan Note (Addendum)
AKI on CKD 3 with creat 1.6 today up from 1.2-1.3 baseline. Suspect pre-renal in setting of symptomatic bradycardia. Strict intake and output Repeat BMP daily

## 2022-05-02 NOTE — ED Notes (Signed)
ED TO INPATIENT HANDOFF REPORT  ED Nurse Name and Phone #: Janett Billow RN  S Name/Age/Gender [name] 89 y.o. male Room/Bed: WA11/WA11  Code Status   Code Status: Prior  Home/SNF/Other Home Patient oriented to: self, place, time, and situation Is this baseline? Yes   Triage Complete: Triage complete  Chief Complaint Symptomatic bradycardia [R00.1]  Triage Note Patient brought in by EMS for SOB, patient was walking and became SOB/dizzy and had to have wife help him sit down. Patient states he continued to have those symptoms after sitting and wanted to be checked out. Does not normally wear oxygen but EMS placed him on 2L for comfort.   Allergies Allergies  Allergen Reactions   Septra [Sulfamethoxazole-Trimethoprim] Itching   Cefadroxil Other (See Comments)    Unknown reaction    Ciprofloxacin Other (See Comments)    Unknown action     Erythromycin Other (See Comments)    Upsets the stomach     Level of Care/Admitting Diagnosis ED Disposition     ED Disposition  Admit   Condition  --   [place]: [place] [[id number]]  Level of Care: Progressive [102]  Admit to Progressive based on following criteria: CARDIOVASCULAR & THORACIC of moderate stability with acute coronary syndrome symptoms/low risk myocardial infarction/hypertensive urgency/arrhythmias/heart failure potentially compromising stability and stable post cardiovascular intervention patients.  May place patient in observation at [facility] or [facility] if equivalent level of care is available:: Yes  Covid Evaluation: Asymptomatic - no recent exposure (last 10 days) testing not required  Diagnosis: Symptomatic bradycardia [id number]  Admitting Physician: Etta Quill [id number]  Attending Physician: Etta Quill [[id number]]          B Medical/Surgery History Past Medical History:  Diagnosis Date   Abdominal pain    Acute renal failure ([place])     resolved    Arthritis    "hands & legs" (11/'10/2014)   Ataxia    Atrial fibrillation (HCC)    Bladder outlet obstruction    Bladder outlet obstruction    BPH (benign prostatic hyperplasia)    CAD (coronary artery disease)    a. CABG IN 1989. b. 01/08/2015 CTO of ost LAD, LIMA to LAD not visualized but assumed patent given myoview finding, occluded SVG to diagonal, 99% mid RCA tx w/ SYNERGY DES 3X28 mm   Constipation    Diabetes mellitus type 2, insulin dependent ([place])    Epistaxis, recurrent Feb 2017   GERD (gastroesophageal reflux disease)    HTN (hypertension)    Hx of bacterial pneumonia    Hyperlipemia    Kidney stones    Mild aortic stenosis    Mild cognitive impairment 03/05/2021   MMSE 26/30 and 6CIT 9 03/2021 AWV   Pneumonia 04/2019   Rib fractures    left rib fractures being treated with pain medications   Stented coronary artery Nov 2016   RCA DES   Urinary tract infection     Enterococcus   Past Surgical History:  Procedure Laterality Date   [place] N/A 01/08/2015   Procedure: Left Heart Cath and Cors/Grafts Angiography;  Surgeon: Peter M Martinique, MD;  Location: [facility];  Service: Cardiovascular;  Laterality: N/A;   CARDIAC CATHETERIZATION  01/08/2015   Procedure: Coronary Stent Intervention;  Surgeon: Peter M Martinique, MD;  Location: [facility];  Service: Cardiovascular;;   CARPAL TUNNEL RELEASE Right 08/2010   CATARACT EXTRACTION  W/ INTRAOCULAR LENS  IMPLANT, BILATERAL Bilateral    CORONARY ANGIOPLASTY  01/08/15   RCA DES   CORONARY ARTERY BYPASS GRAFT  1989   "CABG X 2"   IR THORACENTESIS ASP PLEURAL SPACE W/IMG GUIDE  05/14/2019   JOINT REPLACEMENT     TONSILLECTOMY     TOTAL HIP ARTHROPLASTY Right 2000     A IV Location/Drains/Wounds Patient Lines/Drains/Airways Status     Active Line/Drains/Airways     Name Placement date Placement time Site Days   Peripheral IV 05/01/22 20 G Right Antecubital 05/01/22   2342  Antecubital  1            Intake/Output Last 24 hours No intake or output data in the 24 hours ending 05/02/22 0434  Labs/Imaging Results for orders placed or performed during the hospital encounter of 05/01/22 (from the past 48 hour(s))  Comprehensive metabolic panel     Status: Abnormal   Collection Time: 05/01/22 11:40 PM  Result Value Ref Range   Sodium 139 135 - 145 mmol/L   Potassium 4.0 3.5 - 5.1 mmol/L   Chloride 101 98 - 111 mmol/L   CO2 27 22 - 32 mmol/L   Glucose, Bld 219 (H) 70 - 99 mg/dL    Comment: Glucose reference range applies only to samples taken after fasting for at least 8 hours.   BUN 25 (H) 8 - 23 mg/dL   Creatinine, Ser 1.61 (H) 0.61 - 1.24 mg/dL   Calcium 8.9 8.9 - 10.3 mg/dL   Total Protein 6.9 6.5 - 8.1 g/dL   Albumin 3.5 3.5 - 5.0 g/dL   AST 16 15 - 41 U/L   ALT 12 0 - 44 U/L   Alkaline Phosphatase 51 38 - 126 U/L   Total Bilirubin 0.8 0.3 - 1.2 mg/dL   GFR, Estimated 40 (L) >60 mL/min    Comment: (NOTE) Calculated using the CKD-EPI Creatinine Equation (2021)    Anion gap 11 5 - 15    Comment: Performed at [facility], [place] [address] [place], [place] [address]  CBC with Differential/Platelet     Status: Abnormal   Collection Time: 05/01/22 11:40 PM  Result Value Ref Range   WBC 9.4 4.0 - 10.5 K/uL   RBC 4.33 4.22 - 5.81 MIL/uL   Hemoglobin 9.4 (L) 13.0 - 17.0 g/dL   HCT 33.0 (L) 39.0 - 52.0 %   MCV 76.2 (L) 80.0 - 100.0 fL   MCH 21.7 (L) 26.0 - 34.0 pg   MCHC 28.5 (L) 30.0 - 36.0 g/dL   RDW 19.3 (H) 11.5 - 15.5 %   Platelets 242 150 - 400 K/uL   nRBC 0.0 0.0 - 0.2 %   Neutrophils Relative % 71 %   Neutro Abs 6.7 1.7 - 7.7 K/uL   Lymphocytes Relative 17 %   Lymphs Abs 1.6 0.7 - 4.0 K/uL   Monocytes Relative 7 %   Monocytes Absolute 0.7 0.1 - 1.0 K/uL   Eosinophils Relative 3 %   Eosinophils Absolute 0.3 0.0 - 0.5 K/uL   Basophils Relative 1 %   Basophils Absolute 0.1 0.0 - 0.1 K/uL   Immature  Granulocytes 1 %   Abs Immature Granulocytes 0.05 0.00 - 0.07 K/uL    Comment: Performed at [facility], [place] [address] [place], [place] [address]  Brain natriuretic peptide     Status: Abnormal   Collection Time: 05/01/22 11:40 PM  Result Value Ref Range   B  Natriuretic Peptide 317.3 (H) 0.0 - 100.0 pg/mL    Comment: Performed at [facility], [place] [address] [place], [place] [address]  Blood gas, venous (at [facility] and [facility])     Status: Abnormal   Collection Time: 05/01/22 11:40 PM  Result Value Ref Range   pH, Ven 7.36 7.25 - 7.43   pCO2, Ven 58 44 - 60 mmHg   pO2, Ven <31 (LL) 32 - 45 mmHg    Comment: CRITICAL RESULT CALLED TO, READ BACK BY AND VERIFIED WITH: Raylene Miyamoto RN @ [phone] 05/01/22. GILBERTL    Bicarbonate 32.8 (H) 20.0 - 28.0 mmol/L   Acid-Base Excess 5.6 (H) 0.0 - 2.0 mmol/L   O2 Saturation 25.3 %   Patient temperature 37.0     Comment: Performed at [facility], [place] [address], [place], [place] [address]  Troponin I (High Sensitivity)     Status: None   Collection Time: 05/01/22 11:40 PM  Result Value Ref Range   Troponin I (High Sensitivity) 10 <18 ng/L    Comment: (NOTE) Elevated high sensitivity troponin I (hsTnI) values and significant  changes across serial measurements may suggest ACS but many other  chronic and acute conditions are known to elevate hsTnI results.  Refer to the "Links" section for chest pain algorithms and additional  guidance. Performed at [facility], [place] [address], [place], [place] [address]   Resp panel by RT-PCR (RSV, Flu A&B, Covid) Anterior Nasal Swab     Status: None   Collection Time: 05/01/22 11:53 PM   Specimen: Anterior Nasal Swab  Result Value Ref Range   SARS Coronavirus 2 by RT PCR NEGATIVE NEGATIVE    Comment: (NOTE) SARS-CoV-2 target nucleic acids are NOT DETECTED.  The SARS-CoV-2 RNA is generally detectable in upper respiratory specimens during the  acute phase of infection. The lowest concentration of SARS-CoV-2 viral copies this assay can detect is 138 copies/mL. A negative result does not preclude SARS-Cov-2 infection and should not be used as the sole basis for treatment or other patient management decisions. A negative result may occur with  improper specimen collection/handling, submission of specimen other than nasopharyngeal swab, presence of viral mutation(s) within the areas targeted by this assay, and inadequate number of viral copies(<138 copies/mL). A negative result must be combined with clinical observations, patient history, and epidemiological information. The expected result is Negative.  Fact Sheet for Patients:  EntrepreneurPulse.com.au  Fact Sheet for Healthcare Providers:  IncredibleEmployment.be  This test is no t yet approved or cleared by the [place] FDA and  has been authorized for detection and/or diagnosis of SARS-CoV-2 by FDA under an Emergency Use Authorization (EUA). This EUA will remain  in effect (meaning this test can be used) for the duration of the COVID-19 declaration under Section 564(b)(1) of the Act, 21 U.S.C.section 360bbb-3(b)(1), unless the authorization is terminated  or revoked sooner.       Influenza A by PCR NEGATIVE NEGATIVE   Influenza B by PCR NEGATIVE NEGATIVE    Comment: (NOTE) The Xpert Xpress SARS-CoV-2/FLU/RSV plus assay is intended as an aid in the diagnosis of influenza from Nasopharyngeal swab specimens and should not be used as a sole basis for treatment. Nasal washings and aspirates are unacceptable for Xpert Xpress SARS-CoV-2/FLU/RSV testing.  Fact Sheet for Patients: EntrepreneurPulse.com.au  Fact Sheet for Healthcare Providers: IncredibleEmployment.be  This test is not yet approved or cleared by the [place] FDA and has been authorized for detection and/or  diagnosis of  SARS-CoV-2 by FDA under an Emergency Use Authorization (EUA). This EUA will remain in effect (meaning this test can be used) for the duration of the COVID-19 declaration under Section 564(b)(1) of the Act, 21 U.S.C. section 360bbb-3(b)(1), unless the authorization is terminated or revoked.     Resp Syncytial Virus by PCR NEGATIVE NEGATIVE    Comment: (NOTE) Fact Sheet for Patients: EntrepreneurPulse.com.au  Fact Sheet for Healthcare Providers: IncredibleEmployment.be  This test is not yet approved or cleared by the [place] FDA and has been authorized for detection and/or diagnosis of SARS-CoV-2 by FDA under an Emergency Use Authorization (EUA). This EUA will remain in effect (meaning this test can be used) for the duration of the COVID-19 declaration under Section 564(b)(1) of the Act, 21 U.S.C. section 360bbb-3(b)(1), unless the authorization is terminated or revoked.  Performed at [facility], [place] [address] [place], [place] [address]   Troponin I (High Sensitivity)     Status: None   Collection Time: 05/02/22  2:29 AM  Result Value Ref Range   Troponin I (High Sensitivity) 9 <18 ng/L    Comment: (NOTE) Elevated high sensitivity troponin I (hsTnI) values and significant  changes across serial measurements may suggest ACS but many other  chronic and acute conditions are known to elevate hsTnI results.  Refer to the "Links" section for chest pain algorithms and additional  guidance. Performed at [facility], [place] [address] [place], [place] [address]    DG Chest Port 1 View  Result Date: 05/02/2022 CLINICAL DATA:  Shortness of breath EXAM: PORTABLE CHEST 1 VIEW COMPARISON:  12/05/2021 FINDINGS: Prior CABG. Cardiomegaly. No confluent airspace opacities, effusions or edema. No acute bony abnormality. IMPRESSION: Cardiomegaly.  No active disease. Electronically Signed   By: Rolm Baptise M.D.   On:  05/02/2022 00:36    Pending Labs Unresulted Labs (From admission, onward)    None       Vitals/Pain Today's Vitals   05/02/22 0315 05/02/22 0330 05/02/22 0400 05/02/22 0430  BP: 126/81 (!) 129/103 (!) 156/98 (!) 168/107  Pulse: (!) 128 74 (!) 120 83  Resp: 17 (!) 23 (!) 22 19  Temp:      TempSrc:      SpO2: 97% 100% 100% 100%  Weight:      Height:      PainSc:        Isolation Precautions No active isolations  Medications Medications  apixaban (ELIQUIS) tablet 5 mg (has no administration in time range)    Mobility walks     Focused Assessments Pulmonary Assessment Handoff:  Lung sounds:   O2 Device: Nasal Cannula O2 Flow Rate (L/min): 2 L/min    R Recommendations: See Admitting Provider Note  Report given to:   Additional Notes:

## 2022-05-02 NOTE — Assessment & Plan Note (Addendum)
Holding home lasix in setting of AKI Home med rec pending anyhow. BNP 317 which is about his baseline today. No pulm edema on CXR.

## 2022-05-02 NOTE — Consult Note (Signed)
[facility] Reason for Consult: Drop in hemoglobin from 11 g/dL to 9.4 g/dL Referring Physician: [facility] hospitalist  [name] is an 89 y.o. male.  HPI: [name] is a 89 year old white male with multiple medical problems listed below who presented to the emergency room yesterday with some dizziness and lightheadedness with presyncopal symptoms long with some shortness of breath which prompted his wife to call EMS. Once he was evaluated by the EMS, he brought to the emergency room as he was hypotensive and bradycardic and was FOBT positive. He was seen and evaluated by Dr. Beatrix Fetters from the cardiology service and his Eliquis, Metoprolol and Diltiazem have been held. His last dose of Eliquis was yesterday. His blood pressure and heart rate have improved since admission with the above interventions. Patient denies having any GI problems including abdominal pain, nausea, vomiting, melena or hematochezia. He is on PPI's for GERD. he was noted to be guaiac positive on an outpatient basis and was scheduled to have a colonoscopy by Dr. [name] on 06/09/2022  Past Medical History:  Diagnosis Date   Abdominal pain    Acute renal failure ([place])     resolved   Arthritis    "hands & legs" (11/'10/2014)   Ataxia    Atrial fibrillation (HCC)    Bladder outlet obstruction    Bladder outlet obstruction    BPH (benign prostatic hyperplasia)    CAD (coronary artery disease)    a. CABG IN 1989. b. 01/08/2015 CTO of ost LAD, LIMA to LAD not visualized but assumed patent given myoview finding, occluded SVG to diagonal, 99% mid RCA tx w/ SYNERGY DES 3X28 mm   Constipation    Diabetes mellitus type 2, insulin dependent ([place])    Epistaxis, recurrent Feb 2017   GERD (gastroesophageal reflux disease)    HTN (hypertension)    Hx of bacterial pneumonia    Hyperlipemia    Kidney stones    Mild aortic stenosis    Mild cognitive impairment 03/05/2021   MMSE 26/30 and 6CIT 9 03/2021 AWV    Pneumonia 04/2019   Rib fractures    left rib fractures being treated with pain medications   Stented coronary artery Nov 2016   RCA DES   Urinary tract infection     Enterococcus   Past Surgical History:  Procedure Laterality Date   [place]o N/A 01/08/2015   Procedure: Left Heart Cath and Cors/Grafts Angiography;  Surgeon: Peter M Martinique, MD;  Location: [facility];  Service: Cardiovascular;  Laterality: N/A;   CARDIAC CATHETERIZATION  01/08/2015   Procedure: Coronary Stent Intervention;  Surgeon: Peter M Martinique, MD;  Location: [facility];  Service: Cardiovascular;;   CARPAL TUNNEL RELEASE Right 08/2010   CATARACT EXTRACTION W/ INTRAOCULAR LENS  IMPLANT, BILATERAL Bilateral    CORONARY ANGIOPLASTY  01/08/15   RCA DES   CORONARY ARTERY BYPASS GRAFT  1989   "CABG X 2"   IR THORACENTESIS ASP PLEURAL SPACE W/IMG GUIDE  05/14/2019   JOINT REPLACEMENT     TONSILLECTOMY     TOTAL HIP ARTHROPLASTY Right 2000   Family History  Problem Relation Age of Onset   Brain cancer Father    Throat cancer Brother    Liver disease Neg Hx    Colon cancer Neg Hx    Esophageal cancer Neg Hx    Social History:  reports that he quit smoking about 64 years ago. His  smoking use included cigarettes. He has a 30.00 pack-year smoking history. He has never used smokeless tobacco. He reports that he does not drink alcohol and does not use drugs.  Allergies:  Allergies  Allergen Reactions   Septra [Sulfamethoxazole-Trimethoprim] Itching   Cefadroxil Other (See Comments)    Unknown reaction    Ciprofloxacin Other (See Comments)    Unknown action     Erythromycin Other (See Comments)    Upsets the stomach    Medications: I have reviewed the patient's current medications. Prior to Admission:  Medications Prior to Admission  Medication Sig Dispense Refill Last Dose   apixaban (ELIQUIS) 5 MG TABS tablet Take 1 tablet (5 mg total) by mouth 2  (two) times daily. 60 tablet 2 04/30/2022 at 5 pm   atorvastatin (LIPITOR) 40 MG tablet Take 1 tablet (40 mg total) by mouth daily at 6 PM. (Patient taking differently: Take 40 mg by mouth at bedtime.) 30 tablet 2 04/30/2022   AVODART 0.5 MG capsule Take 0.5 mg by mouth every other day.   04/29/2022   Cholecalciferol (VITAMIN D3 PO) Take 50 mcg by mouth daily.   04/30/2022   diltiazem (TIAZAC) 360 MG 24 hr capsule Take 360 mg by mouth daily.   04/30/2022   empagliflozin (JARDIANCE) 25 MG TABS tablet Take 1 tablet (25 mg total) by mouth daily before breakfast. 30 tablet  04/30/2022   furosemide (LASIX) 40 MG tablet Take 1 tablet (40 mg total) by mouth daily. 90 tablet 3 04/30/2022   gabapentin (NEURONTIN) 300 MG capsule Take 2 capsules (600 mg total) by mouth 3 (three) times daily. 180 capsule 5 04/30/2022   insulin glargine (LANTUS) 100 UNIT/ML injection Inject 40 Units into the skin every morning.   04/30/2022   metFORMIN (GLUCOPHAGE) 500 MG tablet Take 500 mg by mouth 2 (two) times daily with a meal.   04/30/2022   metoprolol succinate (TOPROL-XL) 100 MG 24 hr tablet Take 1 tablet (100 mg total) by mouth daily. Take with or immediately following a meal. (Patient taking differently: Take 100 mg by mouth in the morning.) 180 tablet 2 04/30/2022 at 5 pm   pantoprazole (PROTONIX) 40 MG tablet TAKE 1 TABLET BY MOUTH DAILY AT NOON (Patient taking differently: Take 40 mg by mouth daily in the afternoon.) 90 tablet 3 04/30/2022   tamsulosin (FLOMAX) 0.4 MG CAPS capsule Take 0.4 mg by mouth every other day.   04/30/2022   traMADol (ULTRAM) 50 MG tablet Take 1 tablet (50 mg total) by mouth daily as needed (hand pain). (Patient taking differently: Take 50 mg by mouth daily as needed for moderate pain (hand pain).) 20 tablet 1 04/30/2022   TYLENOL 500 MG tablet Take 500-1,000 mg by mouth every 6 (six) hours as needed for mild pain or headache.   04/30/2022   Scheduled:  insulin aspart  0-15 Units Subcutaneous TID WC   insulin aspart   0-5 Units Subcutaneous QHS   insulin glargine-yfgn  25 Units Subcutaneous Daily   metoprolol tartrate  25 mg Oral BID   Continuous: [id number] **OR** acetaminophen, ondansetron **OR** ondansetron (ZOFRAN) IV  Results for orders placed or performed during the hospital encounter of 05/01/22 (from the past 48 hour(s))  Comprehensive metabolic panel     Status: Abnormal   Collection Time: 05/01/22 11:40 PM  Result Value Ref Range   Sodium 139 135 - 145 mmol/L   Potassium 4.0 3.5 - 5.1 mmol/L   Chloride 101 98 - 111 mmol/L   CO2  27 22 - 32 mmol/L   Glucose, Bld 219 (H) 70 - 99 mg/dL    Comment: Glucose reference range applies only to samples taken after fasting for at least 8 hours.   BUN 25 (H) 8 - 23 mg/dL   Creatinine, Ser 1.61 (H) 0.61 - 1.24 mg/dL   Calcium 8.9 8.9 - 10.3 mg/dL   Total Protein 6.9 6.5 - 8.1 g/dL   Albumin 3.5 3.5 - 5.0 g/dL   AST 16 15 - 41 U/L   ALT 12 0 - 44 U/L   Alkaline Phosphatase 51 38 - 126 U/L   Total Bilirubin 0.8 0.3 - 1.2 mg/dL   GFR, Estimated 40 (L) >60 mL/min    Comment: (NOTE) Calculated using the CKD-EPI Creatinine Equation (2021)    Anion gap 11 5 - 15    Comment: Performed at [facility], [place] [address] [place], [place] [address]  CBC with Differential/Platelet     Status: Abnormal   Collection Time: 05/01/22 11:40 PM  Result Value Ref Range   WBC 9.4 4.0 - 10.5 K/uL   RBC 4.33 4.22 - 5.81 MIL/uL   Hemoglobin 9.4 (L) 13.0 - 17.0 g/dL   HCT 33.0 (L) 39.0 - 52.0 %   MCV 76.2 (L) 80.0 - 100.0 fL   MCH 21.7 (L) 26.0 - 34.0 pg   MCHC 28.5 (L) 30.0 - 36.0 g/dL   RDW 19.3 (H) 11.5 - 15.5 %   Platelets 242 150 - 400 K/uL   nRBC 0.0 0.0 - 0.2 %   Neutrophils Relative % 71 %   Neutro Abs 6.7 1.7 - 7.7 K/uL   Lymphocytes Relative 17 %   Lymphs Abs 1.6 0.7 - 4.0 K/uL   Monocytes Relative 7 %   Monocytes Absolute 0.7 0.1 - 1.0 K/uL   Eosinophils Relative 3 %   Eosinophils Absolute 0.3 0.0 - 0.5 K/uL   Basophils  Relative 1 %   Basophils Absolute 0.1 0.0 - 0.1 K/uL   Immature Granulocytes 1 %   Abs Immature Granulocytes 0.05 0.00 - 0.07 K/uL    Comment: Performed at [facility], [place] [address] [place], [place] [address]  Brain natriuretic peptide     Status: Abnormal   Collection Time: 05/01/22 11:40 PM  Result Value Ref Range   B Natriuretic Peptide 317.3 (H) 0.0 - 100.0 pg/mL    Comment: Performed at [facility], [place] [address], [place],  [address]  Blood gas, venous (at [facility] and [facility])     Status: Abnormal   Collection Time: 05/01/22 11:40 PM  Result Value Ref Range   pH, Ven 7.36 7.25 - 7.43   pCO2, Ven 58 44 - 60 mmHg   pO2, Ven <31 (LL) 32 - 45 mmHg    Comment: CRITICAL RESULT CALLED TO, READ BACK BY AND VERIFIED WITH: Raylene Miyamoto RN @ [phone] 05/01/22. GILBERTL    Bicarbonate 32.8 (H) 20.0 - 28.0 mmol/L   Acid-Base Excess 5.6 (H) 0.0 - 2.0 mmol/L   O2 Saturation 25.3 %   Patient temperature 37.0     Comment: Performed at [facility], [place] [address], [place], [place] [address]  Troponin I (High Sensitivity)     Status: None   Collection Time: 05/01/22 11:40 PM  Result Value Ref Range   Troponin I (High Sensitivity) 10 <18 ng/L    Comment: (NOTE) Elevated high sensitivity troponin I (hsTnI) values and significant  changes across serial measurements  may suggest ACS but many other  chronic and acute conditions are known to elevate hsTnI results.  Refer to the "Links" section for chest pain algorithms and additional  guidance. Performed at [facility], [place] [address], [place], [place] [address]   Resp panel by RT-PCR (RSV, Flu A&B, Covid) Anterior Nasal Swab     Status: None   Collection Time: 05/01/22 11:53 PM   Specimen: Anterior Nasal Swab  Result Value Ref Range   SARS Coronavirus 2 by RT PCR NEGATIVE NEGATIVE    Comment: (NOTE) SARS-CoV-2 target nucleic acids are NOT DETECTED.  The SARS-CoV-2 RNA  is generally detectable in upper respiratory specimens during the acute phase of infection. The lowest concentration of SARS-CoV-2 viral copies this assay can detect is 138 copies/mL. A negative result does not preclude SARS-Cov-2 infection and should not be used as the sole basis for treatment or other patient management decisions. A negative result may occur with  improper specimen collection/handling, submission of specimen other than nasopharyngeal swab, presence of viral mutation(s) within the areas targeted by this assay, and inadequate number of viral copies(<138 copies/mL). A negative result must be combined with clinical observations, patient history, and epidemiological information. The expected result is Negative.  Fact Sheet for Patients:  EntrepreneurPulse.com.au  Fact Sheet for Healthcare Providers:  IncredibleEmployment.be  This test is no t yet approved or cleared by the [place] FDA and  has been authorized for detection and/or diagnosis of SARS-CoV-2 by FDA under an Emergency Use Authorization (EUA). This EUA will remain  in effect (meaning this test can be used) for the duration of the COVID-19 declaration under Section 564(b)(1) of the Act, 21 U.S.C.section 360bbb-3(b)(1), unless the authorization is terminated  or revoked sooner.       Influenza A by PCR NEGATIVE NEGATIVE   Influenza B by PCR NEGATIVE NEGATIVE    Comment: (NOTE) The Xpert Xpress SARS-CoV-2/FLU/RSV plus assay is intended as an aid in the diagnosis of influenza from Nasopharyngeal swab specimens and should not be used as a sole basis for treatment. Nasal washings and aspirates are unacceptable for Xpert Xpress SARS-CoV-2/FLU/RSV testing.  Fact Sheet for Patients: EntrepreneurPulse.com.au  Fact Sheet for Healthcare Providers: IncredibleEmployment.be  This test is not yet approved or cleared by the [place] FDA  and has been authorized for detection and/or diagnosis of SARS-CoV-2 by FDA under an Emergency Use Authorization (EUA). This EUA will remain in effect (meaning this test can be used) for the duration of the COVID-19 declaration under Section 564(b)(1) of the Act, 21 U.S.C. section 360bbb-3(b)(1), unless the authorization is terminated or revoked.     Resp Syncytial Virus by PCR NEGATIVE NEGATIVE    Comment: (NOTE) Fact Sheet for Patients: EntrepreneurPulse.com.au  Fact Sheet for Healthcare Providers: IncredibleEmployment.be  This test is not yet approved or cleared by the [place] FDA and has been authorized for detection and/or diagnosis of SARS-CoV-2 by FDA under an Emergency Use Authorization (EUA). This EUA will remain in effect (meaning this test can be used) for the duration of the COVID-19 declaration under Section 564(b)(1) of the Act, 21 U.S.C. section 360bbb-3(b)(1), unless the authorization is terminated or revoked.  Performed at [facility], [place] [address] [place], [place] [address]   Troponin I (High Sensitivity)     Status: None   Collection Time: 05/02/22  2:29 AM  Result Value Ref Range   Troponin I (High Sensitivity) 9 <18 ng/L    Comment: (NOTE) Elevated high  sensitivity troponin I (hsTnI) values and significant  changes across serial measurements may suggest ACS but many other  chronic and acute conditions are known to elevate hsTnI results.  Refer to the "Links" section for chest pain algorithms and additional  guidance. Performed at [facility], [place] [address], [place], [place] [address]   Vitamin B12     Status: None   Collection Time: 05/02/22  6:40 AM  Result Value Ref Range   Vitamin B-12 404 180 - 914 pg/mL    Comment: (NOTE) This assay is not validated for testing neonatal or myeloproliferative syndrome specimens for Vitamin B12 levels. Performed at [facility], [place] [address] [place], [place] [address]   Folate     Status: None   Collection Time: 05/02/22  6:40 AM  Result Value Ref Range   Folate 14.9 >5.9 ng/mL    Comment: Performed at [facility], [place] [address], [place], [place] [address]  Iron and TIBC     Status: Abnormal   Collection Time: 05/02/22  6:40 AM  Result Value Ref Range   Iron 24 (L) 45 - 182 ug/dL   TIBC 409 250 - 450 ug/dL   Saturation Ratios 6 (L) 17.9 - 39.5 %   UIBC 385 ug/dL    Comment: Performed at [facility], [place] [address] [place], [place] [address]  Ferritin     Status: None   Collection Time: 05/02/22  6:40 AM  Result Value Ref Range   Ferritin 35 24 - 336 ng/mL    Comment: Performed at [facility], [place] [address] [place], [place] [address]  Reticulocytes     Status: Abnormal   Collection Time: 05/02/22  6:40 AM  Result Value Ref Range   Retic Ct Pct 1.4 0.4 - 3.1 %   RBC. 4.27 4.22 - 5.81 MIL/uL   Retic Count, Absolute 59.4 19.0 - 186.0 K/uL   Immature Retic Fract 21.9 (H) 2.3 - 15.9 %    Comment: Performed at [facility], [place] [address] [place], [place] [address]  Type and screen [place]     Status: None (Preliminary result)   Collection Time: 05/02/22  7:55 AM  Result Value Ref Range   ABO/RH(D) PENDING    Antibody Screen PENDING    Sample Expiration      05/05/2022,2359 Performed at [facility], [place] [address] [place], [place] [address]     DG Chest Port 1 View  Result Date: 05/02/2022 CLINICAL DATA:  Shortness of breath EXAM: PORTABLE CHEST 1 VIEW COMPARISON:  12/05/2021 FINDINGS: Prior CABG. Cardiomegaly. No confluent airspace opacities, effusions or edema. No acute bony abnormality. IMPRESSION: Cardiomegaly.  No active disease. Electronically Signed   By: Rolm Baptise M.D.   On: 05/02/2022 00:36    Review of Systems  Constitutional:  Positive for  activity change and fatigue. Negative for appetite change, chills, diaphoresis, fever and unexpected weight change.  HENT: Negative.    Eyes: Negative.   Respiratory:  Positive for shortness of breath. Negative for cough, choking, chest tightness, wheezing and stridor.   Cardiovascular: Negative.   Gastrointestinal: Negative.   Endocrine: Negative.   Genitourinary: Negative.   Musculoskeletal:  Positive for arthralgias.  Allergic/Immunologic: Negative.   Neurological:  Positive for dizziness, weakness and light-headedness.  Hematological: Negative.   Psychiatric/Behavioral: Negative.     Blood pressure (!) 151/81, pulse 76, temperature 97.6 F (36.4 C), temperature  source Oral, resp. rate 19, height '5\' 6"'$  (1.676 m), weight 93 kg, SpO2 100 %. Physical Exam Constitutional:      General: He is not in acute distress.    Appearance: He is well-developed. He is obese. He is not ill-appearing.  HENT:     Head: Normocephalic and atraumatic.     Mouth/Throat:     Mouth: Mucous membranes are moist.     Pharynx: Oropharynx is clear.  Musculoskeletal:     Cervical back: Normal range of motion and neck supple.  Neurological:     Mental Status: He is alert.  Psychiatric:        Mood and Affect: Mood normal.    Assessment/Plan: 1) Guaiac positive stools with anemia in the setting of multiple cardiac issues on Eliquis for permanent atrial fibrillation-this has been held since admission; his last dose of Eliquis was yesterday. Endoscopic procedures will be formed after the Eliquis has been held for 3 days.  2) GERD on PPI's 3) Permanent atrial fibrillation on Eliquis; on Metoprolol and Diltiazem which has been held. 4) History of CAD status post CABG in 1990 with subsequent DES to RCA in 2006. 5) AODM. 6) HTN/Hyperlipidemia. 7)  Moderate aortic stenosis. 8) Chronic diastolic dysfunction. 9) BPH. 10) History of nephrolithiasis.     Juanita Craver 05/02/2022, 8:39 AM

## 2022-05-02 NOTE — Assessment & Plan Note (Addendum)
Hold eliquis in setting of GIB. Holding BB and CCB due to bradycardia.

## 2022-05-02 NOTE — Assessment & Plan Note (Addendum)
Pt with ongoing hemoccult positive stools, mild anemia that has been slowly progressive over past month.  HGB down to 9.x from 10.x 1 month ago. Pt already has colonoscopy scheduled on 4/10 it looks like with Dr. Lyndel Safe. H/H at noon Check CBC daily while here Check anemia panel (probably iron def given microcytic hypochromic anemia). Will send message to [facility] given worsening anemia to see if they want to do this sooner / etc. Type and screen

## 2022-05-02 NOTE — Progress Notes (Signed)
  Echocardiogram 2D Echocardiogram has been performed.  Eartha Inch 05/02/2022, 1:45 PM

## 2022-05-02 NOTE — Assessment & Plan Note (Addendum)
Hold jardiance and metformin Cont lantus at reduced dose of 25u (he takes 40-42 QAM at home) given his AKI today Adding SSI mod scale AC/HS

## 2022-05-02 NOTE — Assessment & Plan Note (Addendum)
Very mild.  Seems quite sharp and oriented on evaluation today.

## 2022-05-02 NOTE — Progress Notes (Signed)
Brief progress note: -Patient was admitted earlier today. -As per H&P done on admission: "[name] is a 89 y.o. male with medical history significant of DM2, HTN, A.Fib rate controlled and on eliquis.   Pt high functioning despite age.  At baseline is independent living, does all ADLs.   Pt in to ED with c/o dizziness / lightheadedness and pre-syncopal symptoms after standing last night.  Then developed SOB which persisted despite sitting back down.   Has been having milder symptoms of lightheadedness and dizziness on standing recently for past weeks now.   Pt with known ongoing GIB and progressive blood loss anemia, hemoccult positive stool.  He has been scheduled for colonoscopy with Dr. Lyndel Safe April 10th.   No CP, no fevers / chills.   EMS = SBP in 90s and HRs in 40-60 range.   No N/V.   No grossly bloody BMs.    Postural dizziness with presyncope Orthostatic hypotension and presyncope due to either: -Worsening anemia and accelerating blood loss from known GIB AND/OR -symptomatic bradycardia in setting of BB and CCB use for A.Fib rate control.   Addressing each as outlined below:   Lower GI bleeding Pt with ongoing hemoccult positive stools, mild anemia that has been slowly progressive over past month.  HGB down to 9.x from 10.x 1 month ago. Pt already has colonoscopy scheduled on 4/10 it looks like with Dr. Lyndel Safe. H/H at noon Check CBC daily while here Check anemia panel (probably iron def given microcytic hypochromic anemia). Will send message to [facility] given worsening anemia to see if they want to do this sooner / etc. Type and screen   Symptomatic bradycardia Pt now with bradycardia, suspected to be symptomatic bradycardia with SBPs in the 90s in ER this morning. EDP discussed with cards Hold cardizem and metoprolol Okay to stay over at [facility] for now Tele monitor Resume cardizem and metoprolol at lower dosing after HR and BP rises.   AKI (acute kidney injury)  ([place]) AKI on CKD 3 with creat 1.6 today up from 1.2-1.3 baseline. Suspect pre-renal in setting of symptomatic bradycardia. Strict intake and output Repeat BMP daily   Chronic atrial fibrillation ([place]) Hold eliquis in setting of GIB. Holding BB and CCB due to bradycardia.   Mild cognitive impairment Very mild.  Seems quite sharp and oriented on evaluation today.   Chronic diastolic CHF (congestive heart failure) (HCC) Holding home lasix in setting of AKI Home med rec pending anyhow. BNP 317 which is about his baseline today. No pulm edema on CXR.   Diabetic peripheral neuropathy associated with type 2 diabetes mellitus (HCC) Hold jardiance and metformin Cont lantus at reduced dose of 25u (he takes 40-42 QAM at home) given his AKI today Adding SSI mod scale AC/HS"   -Patient seen alongside patient's wife, grandson and nephew. -No symptoms reported. -GI input is appreciated.  GI plans to wait for 3 days prior to any scopes as patient was on Eliquis prior to presentation. -Cardiology input is appreciated.  Beta-blocker has been changed to metoprolol tartrate 25 Mg p.o. twice daily.  Apparently, patient was on Cardizem and Toprol-XL prior to admission.  Significant bradycardia noted on presentation.  Heart rate is now 74 bpm.   -Repeat hemoglobin around 11:47 AM was 9.2 g/dL.  Patient is also iron deficient, likely from GI bleed. -Further management will depend on hospital course.

## 2022-05-02 NOTE — H&P (Signed)
History and Physical    Patient: [name] [id number] DOB: 1928/05/30 DOA: 05/01/2022 DOS: the patient was seen and examined on 05/02/2022 PCP: [name], MD  Patient coming from: Home  Chief Complaint:  Chief Complaint  Patient presents with   Shortness of Breath   HPI: [name] is a 89 y.o. male with medical history significant of DM2, HTN, A.Fib rate controlled and on eliquis.  Pt high functioning despite age.  At baseline is independent living, does all ADLs.  Pt in to ED with c/o dizziness / lightheadedness and pre-syncopal symptoms after standing last night.  Then developed SOB which persisted despite sitting back down.  Has been having milder symptoms of lightheadedness and dizziness on standing recently for past weeks now.  Pt with known ongoing GIB and progressive blood loss anemia, hemoccult positive stool.  He has been scheduled for colonoscopy with Dr. Lyndel Safe April 10th.  No CP, no fevers / chills.  EMS = SBP in 90s and HRs in 40-60 range.  No N/V.  No grossly bloody BMs.    Review of Systems: As mentioned in the history of present illness. All other systems reviewed and are negative. Past Medical History:  Diagnosis Date   Abdominal pain    Acute renal failure ([place])     resolved   Arthritis    "hands & legs" (11/'10/2014)   Ataxia    Atrial fibrillation (HCC)    Bladder outlet obstruction    Bladder outlet obstruction    BPH (benign prostatic hyperplasia)    CAD (coronary artery disease)    a. CABG IN 1989. b. 01/08/2015 CTO of ost LAD, LIMA to LAD not visualized but assumed patent given myoview finding, occluded SVG to diagonal, 99% mid RCA tx w/ SYNERGY DES 3X28 mm   Constipation    Diabetes mellitus type 2, insulin dependent ([place])    Epistaxis, recurrent Feb 2017   GERD (gastroesophageal reflux disease)    HTN (hypertension)    Hx of bacterial pneumonia    Hyperlipemia    Kidney stones    Mild aortic stenosis    Mild cognitive  impairment 03/05/2021   MMSE 26/30 and 6CIT 9 03/2021 AWV   Pneumonia 04/2019   Rib fractures    left rib fractures being treated with pain medications   Stented coronary artery Nov 2016   RCA DES   Urinary tract infection     Enterococcus   Past Surgical History:  Procedure Laterality Date   [place] N/A 01/08/2015   Procedure: Left Heart Cath and Cors/Grafts Angiography;  Surgeon: Peter M Martinique, MD;  Location: [facility];  Service: Cardiovascular;  Laterality: N/A;   CARDIAC CATHETERIZATION  01/08/2015   Procedure: Coronary Stent Intervention;  Surgeon: Peter M Martinique, MD;  Location: [facility];  Service: Cardiovascular;;   CARPAL TUNNEL RELEASE Right 08/2010   CATARACT EXTRACTION W/ INTRAOCULAR LENS  IMPLANT, BILATERAL Bilateral    CORONARY ANGIOPLASTY  01/08/15   RCA DES   CORONARY ARTERY BYPASS GRAFT  1989   "CABG X 2"   IR THORACENTESIS ASP PLEURAL SPACE W/IMG GUIDE  05/14/2019   JOINT REPLACEMENT     TONSILLECTOMY     TOTAL HIP ARTHROPLASTY Right 2000   Social History:  reports that he quit smoking about 64 years ago. His smoking use included cigarettes. He has a 30.00 pack-year smoking history. He has never used smokeless tobacco. He reports that he does  not drink alcohol and does not use drugs.  Allergies  Allergen Reactions   Septra [Sulfamethoxazole-Trimethoprim] Itching   Cefadroxil Other (See Comments)    Unknown reaction    Ciprofloxacin Other (See Comments)    Unknown action     Erythromycin Other (See Comments)    Upsets the stomach     Family History  Problem Relation Age of Onset   Brain cancer Father    Throat cancer Brother    Liver disease Neg Hx    Colon cancer Neg Hx    Esophageal cancer Neg Hx     Prior to Admission medications   Medication Sig Start Date End Date Taking? Authorizing Provider  apixaban (ELIQUIS) 5 MG TABS tablet Take 1 tablet (5 mg total) by mouth 2 (two) times daily.  06/08/16   Reyne Dumas, MD  atorvastatin (LIPITOR) 40 MG tablet Take 1 tablet (40 mg total) by mouth daily at 6 PM. 06/08/16   Reyne Dumas, MD  AVODART 0.5 MG capsule Take 0.5 mg by mouth See admin instructions. Take 0.5 mg by mouth every other night- at bedtime 07/20/11   [provider]  Cholecalciferol (VITAMIN D3 PO) Take 1 capsule by mouth daily.    [provider]  diltiazem (TIAZAC) 360 MG 24 hr capsule Take 360 mg by mouth daily. 11/08/19   [provider]  empagliflozin (JARDIANCE) 25 MG TABS tablet Take 1 tablet (25 mg total) by mouth daily before breakfast. 09/03/20   Martinique, Peter M, MD  furosemide (LASIX) 40 MG tablet Take 1 tablet (40 mg total) by mouth daily. 10/23/21   Martinique, Peter M, MD  gabapentin (NEURONTIN) 300 MG capsule Take 2 capsules (600 mg total) by mouth 3 (three) times daily. 03/11/22   [name], MD  insulin glargine (LANTUS) 100 UNIT/ML injection Inject 40 Units into the skin every morning.    [provider]  metFORMIN (GLUCOPHAGE) 500 MG tablet Take 500 mg by mouth 2 (two) times daily with a meal.    [provider]  metoprolol succinate (TOPROL-XL) 100 MG 24 hr tablet Take 1 tablet (100 mg total) by mouth daily. Take with or immediately following a meal. Patient taking differently: Take 100 mg by mouth in the morning. 09/09/21   Martinique, Peter M, MD  pantoprazole (PROTONIX) 40 MG tablet TAKE 1 TABLET BY MOUTH DAILY AT NOON Patient taking differently: Take 40 mg by mouth daily in the afternoon. 05/25/21   Martinique, Peter M, MD  tamsulosin (FLOMAX) 0.4 MG CAPS capsule Take 0.4 mg by mouth every other day. 03/19/21   [provider]  traMADol (ULTRAM) 50 MG tablet Take 1 tablet (50 mg total) by mouth daily as needed (hand pain). 01/18/22   [name], MD  TYLENOL 500 MG tablet Take 500-1,000 mg by mouth every 6 (six) hours as needed for mild pain or headache.    [provider]    Physical Exam: Vitals:    05/02/22 0330 05/02/22 0400 05/02/22 0430 05/02/22 0522  BP: (!) 129/103 (!) 156/98 (!) 168/107 (!) 151/81  Pulse: 74 (!) 120 83 76  Resp: (!) 23 (!) '22 19 19  '$ Temp:    97.6 F (36.4 C)  TempSrc:    Oral  SpO2: 100% 100% 100% 100%  Weight:      Height:       Constitutional: NAD, calm, comfortable Respiratory: clear to auscultation bilaterally, no wheezing, no crackles. Normal respiratory effort. No accessory muscle use.  Cardiovascular:  Regular rate and rhythm, no murmurs / rubs / gallops. No extremity edema. 2+ pedal pulses. No carotid bruits.  Abdomen: no tenderness, no masses palpated. No hepatosplenomegaly. Bowel sounds positive.  Neurologic: CN 2-12 grossly intact. Sensation intact, DTR normal. Strength 5/5 in all 4.  Psychiatric: Normal judgment and insight. Alert and oriented x 3. Normal mood.   Data Reviewed:       Latest Ref Rng & Units 05/01/2022   11:40 PM 04/07/2022   11:19 AM 03/11/2022   10:54 AM  CBC  WBC 4.0 - 10.5 K/uL 9.4  11.9  8.8   Hemoglobin 13.0 - 17.0 g/dL 9.4  10.5  11.0   Hematocrit 39.0 - 52.0 % 33.0  34.2  35.1   Platelets 150 - 400 K/uL 242  281.0  312.0       Latest Ref Rng & Units 05/01/2022   11:40 PM 04/07/2022   11:19 AM 03/11/2022   10:54 AM  CMP  Glucose 70 - 99 mg/dL 219  187  121   BUN 8 - 23 mg/dL '25  23  23   '$ Creatinine 0.61 - 1.24 mg/dL 1.61  1.27  1.37   Sodium 135 - 145 mmol/L 139  143  144   Potassium 3.5 - 5.1 mmol/L 4.0  4.7  4.6   Chloride 98 - 111 mmol/L 101  105  104   CO2 22 - 32 mmol/L '27  30  30   '$ Calcium 8.9 - 10.3 mg/dL 8.9  9.6  9.8   Total Protein 6.5 - 8.1 g/dL 6.9  7.4  6.9   Total Bilirubin 0.3 - 1.2 mg/dL 0.8  0.9  0.8   Alkaline Phos 38 - 126 U/L 51  65  69   AST 15 - 41 U/L '16  12  12   '$ ALT 0 - 44 U/L '12  9  9    '$ EKG showing A.Fib with slow ventricular response  BNP 317  Assessment and Plan: * Postural dizziness with presyncope Orthostatic hypotension and presyncope due to either: -Worsening anemia and  accelerating blood loss from known GIB AND/OR -symptomatic bradycardia in setting of BB and CCB use for A.Fib rate control.  Addressing each as outlined below:  Lower GI bleeding Pt with ongoing hemoccult positive stools, mild anemia that has been slowly progressive over past month.  HGB down to 9.x from 10.x 1 month ago. Pt already has colonoscopy scheduled on 4/10 it looks like with Dr. Lyndel Safe. H/H at noon Check CBC daily while here Check anemia panel (probably iron def given microcytic hypochromic anemia). Will send message to [facility] given worsening anemia to see if they want to do this sooner / etc. Type and screen  Symptomatic bradycardia Pt now with bradycardia, suspected to be symptomatic bradycardia with SBPs in the 90s in ER this morning. EDP discussed with cards Hold cardizem and metoprolol Okay to stay over at [facility] for now Tele monitor Resume cardizem and metoprolol at lower dosing after HR and BP rises.  AKI (acute kidney injury) ([place]) AKI on CKD 3 with creat 1.6 today up from 1.2-1.3 baseline. Suspect pre-renal in setting of symptomatic bradycardia. Strict intake and output Repeat BMP daily  Chronic atrial fibrillation ([place]) Hold eliquis in setting of GIB. Holding BB and CCB due to bradycardia.  Mild cognitive impairment Very mild.  Seems quite sharp and oriented on evaluation today.  Chronic diastolic CHF (congestive heart failure) (HCC) Holding home lasix in setting of AKI Home med  rec pending anyhow. BNP 317 which is about his baseline today. No pulm edema on CXR.  Diabetic peripheral neuropathy associated with type 2 diabetes mellitus ([place]) Hold jardiance and metformin Cont lantus at reduced dose of 25u (he takes 40-42 QAM at home) given his AKI today Adding SSI mod scale AC/HS      Advance Care Planning:   Code Status: Full Code Confirmed with patient, this seems reasonable despite advanced age, seems high functioning still (independent living,  etc).  Consults: EDP d/w Cards as above,  message sent to Dr. Benson [place] of [facility] who are on call for [facility].  Family Communication: No family in room  Severity of Illness: The appropriate patient status for this patient is OBSERVATION. Observation status is judged to be reasonable and necessary in order to provide the required intensity of service to ensure the patient's safety. The patient's presenting symptoms, physical exam findings, and initial radiographic and laboratory data in the context of their medical condition is felt to place them at decreased risk for further clinical deterioration. Furthermore, it is anticipated that the patient will be medically stable for discharge from the hospital within 2 midnights of admission.   Author: Etta Quill., DO 05/02/2022 5:59 AM  For on call review [email or web address].

## 2022-05-02 NOTE — Assessment & Plan Note (Deleted)
Now with bradycardia, suspected to be symptomatic bradycardia. EDP discussed with cards Hold cardizem and metoprolol Okay to stay over at [facility] for now Tele monitor

## 2022-05-02 NOTE — Assessment & Plan Note (Addendum)
Pt now with bradycardia, suspected to be symptomatic bradycardia with SBPs in the 90s in ER this morning. EDP discussed with cards Hold cardizem and metoprolol Okay to stay over at [facility] for now Tele monitor Resume cardizem and metoprolol at lower dosing after HR and BP rises.

## 2022-05-02 NOTE — Assessment & Plan Note (Signed)
Orthostatic hypotension and presyncope due to either: -Worsening anemia and accelerating blood loss from known GIB AND/OR -symptomatic bradycardia in setting of BB and CCB use for A.Fib rate control.  Addressing each as outlined below:

## 2022-05-03 DIAGNOSIS — Z7901 Long term (current) use of anticoagulants: Secondary | ICD-10-CM | POA: Diagnosis not present

## 2022-05-03 DIAGNOSIS — D500 Iron deficiency anemia secondary to blood loss (chronic): Secondary | ICD-10-CM

## 2022-05-03 DIAGNOSIS — R195 Other fecal abnormalities: Secondary | ICD-10-CM

## 2022-05-03 DIAGNOSIS — R42 Dizziness and giddiness: Secondary | ICD-10-CM | POA: Diagnosis not present

## 2022-05-03 DIAGNOSIS — D509 Iron deficiency anemia, unspecified: Secondary | ICD-10-CM | POA: Diagnosis not present

## 2022-05-03 DIAGNOSIS — I10 Essential (primary) hypertension: Secondary | ICD-10-CM | POA: Diagnosis not present

## 2022-05-03 DIAGNOSIS — R55 Syncope and collapse: Secondary | ICD-10-CM | POA: Diagnosis not present

## 2022-05-03 LAB — RENAL FUNCTION PANEL
Albumin: 3.9 g/dL (ref 3.5–5.0)
Anion gap: 10 (ref 5–15)
BUN: 22 mg/dL (ref 8–23)
CO2: 26 mmol/L (ref 22–32)
Calcium: 9 mg/dL (ref 8.9–10.3)
Chloride: 105 mmol/L (ref 98–111)
Creatinine, Ser: 1.16 mg/dL (ref 0.61–1.24)
GFR, Estimated: 59 mL/min — ABNORMAL LOW (ref >60)
Glucose, Bld: 91 mg/dL (ref 70–99)
Phosphorus: 4.5 mg/dL (ref 2.5–4.6)
Potassium: 3.9 mmol/L (ref 3.5–5.1)
Sodium: 141 mmol/L (ref 135–145)

## 2022-05-03 LAB — CBC WITH DIFFERENTIAL/PLATELET
Abs Immature Granulocytes: 0.04 10*3/uL (ref 0.00–0.07)
Basophils Absolute: 0.1 10*3/uL (ref 0.0–0.1)
Basophils Relative: 1 %
Eosinophils Absolute: 0.5 10*3/uL (ref 0.0–0.5)
Eosinophils Relative: 6 %
HCT: 34.4 % — ABNORMAL LOW (ref 39.0–52.0)
Hemoglobin: 9.9 g/dL — ABNORMAL LOW (ref 13.0–17.0)
Immature Granulocytes: 0 %
Lymphocytes Relative: 18 %
Lymphs Abs: 1.7 10*3/uL (ref 0.7–4.0)
MCH: 21.7 pg — ABNORMAL LOW (ref 26.0–34.0)
MCHC: 28.8 g/dL — ABNORMAL LOW (ref 30.0–36.0)
MCV: 75.4 fL — ABNORMAL LOW (ref 80.0–100.0)
Monocytes Absolute: 0.8 10*3/uL (ref 0.1–1.0)
Monocytes Relative: 8 %
Neutro Abs: 6.1 10*3/uL (ref 1.7–7.7)
Neutrophils Relative %: 67 %
Platelets: 264 10*3/uL (ref 150–400)
RBC: 4.56 MIL/uL (ref 4.22–5.81)
RDW: 18.8 % — ABNORMAL HIGH (ref 11.5–15.5)
WBC: 9.3 10*3/uL (ref 4.0–10.5)
nRBC: 0 % (ref 0.0–0.2)

## 2022-05-03 LAB — GLUCOSE, CAPILLARY
Glucose-Capillary: 84 mg/dL (ref 70–99)
Glucose-Capillary: 146 mg/dL — ABNORMAL HIGH (ref 70–99)
Glucose-Capillary: 86 mg/dL (ref 70–99)
Glucose-Capillary: 166 mg/dL — ABNORMAL HIGH (ref 70–99)

## 2022-05-03 LAB — MAGNESIUM: Magnesium: 2.1 mg/dL (ref 1.7–2.4)

## 2022-05-03 MED ORDER — TAMSULOSIN HCL 0.4 MG PO CAPS
0.4000 mg | ORAL_CAPSULE | ORAL | Status: DC
  Administered 2022-05-03 – 2022-05-05 (×2): 0.4 mg via ORAL
  Filled 2022-05-03 (×2): qty 1

## 2022-05-03 MED ORDER — GABAPENTIN 300 MG PO CAPS: 600.0000 mg | ORAL_CAPSULE | Freq: Three times a day (TID) | ORAL | Status: DC

## 2022-05-03 MED ORDER — PEG-KCL-NACL-NASULF-NA ASC-C 100 G PO SOLR
0.5000 | Freq: Once | ORAL | Status: AC
  Administered 2022-05-04: 100 g via ORAL
  Filled 2022-05-03: qty 1

## 2022-05-03 MED ORDER — GABAPENTIN 300 MG PO CAPS
300.0000 mg | ORAL_CAPSULE | Freq: Three times a day (TID) | ORAL | Status: DC
  Administered 2022-05-03 – 2022-05-06 (×10): 300 mg via ORAL
  Filled 2022-05-03 (×10): qty 1

## 2022-05-03 MED ORDER — PEG-KCL-NACL-NASULF-NA ASC-C 100 G PO SOLR: 0.5000 | Freq: Once | ORAL | Status: DC

## 2022-05-03 MED ORDER — PEG-KCL-NACL-NASULF-NA ASC-C 100 G PO SOLR
0.5000 | Freq: Once | ORAL | Status: AC
  Administered 2022-05-04: 100 g via ORAL

## 2022-05-03 MED ORDER — PEG-KCL-NACL-NASULF-NA ASC-C 100 G PO SOLR: 1.0000 | Freq: Once | ORAL | Status: DC

## 2022-05-03 MED ORDER — PEG-KCL-NACL-NASULF-NA ASC-C 100 G PO SOLR
0.5000 | Freq: Once | ORAL | Status: DC
  Filled 2022-05-03: qty 1

## 2022-05-03 NOTE — Hospital Course (Addendum)
89 y.o. male with medical history significant of DM2, HTN, A.Fib rate controlled and on eliquis, who is high functioning despite age,independent living, does all ADLs presented to the ED with dizziness / lightheadedness and pre-syncopal symptoms after standing last night. Then developed SOB which persisted despite sitting back down. Has been having milder symptoms of lightheadedness and dizziness on standing recently for past weeks now. Pt with known ongoing GIB and progressive blood loss anemia, hemoccult positive stool.  He has been scheduled for colonoscopy with Dr. Lyndel Safe April 10th. Seen in the ED.EMS = SBP in 90s and HRs in 40-60 range.No N/V. No grossly bloody BMs. Patient admitted for lower GI bleeding/postural dizziness with presyncope

## 2022-05-03 NOTE — Progress Notes (Addendum)
Progress Note   Subjective  Chief Complaint: Anemia with Hemoccult positive stool  This morning it is clear that patient is a poor historian.  He is aware of plans for possible colonoscopy but tells me he has never seen any blood in his stool.  He tells me that he thinks this is planned for Wednesday.  Denies any new complaints or concerns.  No abdominal pain.  He has not had any further blood in his stool since admission per him.   Objective   Vital signs in last 24 hours: Temp:  [97.8 F (36.6 C)-98 F (36.7 C)] 97.9 F (36.6 C) (03/04 0340) Pulse Rate:  [74-94] 81 (03/04 0340) Resp:  [20-24] 20 (03/04 0340) BP: (139-159)/(59-80) 154/80 (03/04 0340) SpO2:  [94 %-99 %] 94 % (03/04 0340) Last BM Date : 05/02/22 General:   Elderly white male in NAD Heart:  Regular rate and rhythm; no murmurs Lungs: Respirations even and unlabored, lungs CTA bilaterally Abdomen:  Soft, nontender and nondistended. Normal bowel sounds. Psych:  Cooperative. Normal mood and affect.  Intake/Output from previous day: 03/03 0701 - 03/04 0700 In: 410 [P.O.:410] Out: -  Intake/Output this shift: Total I/O In: 120 [P.O.:120] Out: -   Lab Results: Recent Labs    05/01/22 2340 05/02/22 1147 05/03/22 0332  WBC 9.4  --  9.3  HGB 9.4* 9.2* 9.9*  HCT 33.0* 31.4* 34.4*  PLT 242  --  264   BMET Recent Labs    05/01/22 2340 05/03/22 0332  NA 139 141  K 4.0 3.9  CL 101 105  CO2 27 26  GLUCOSE 219* 91  BUN 25* 22  CREATININE 1.61* 1.16  CALCIUM 8.9 9.0   LFT Recent Labs    05/01/22 2340 05/03/22 0332  PROT 6.9  --   ALBUMIN 3.5 3.9  AST 16  --   ALT 12  --   ALKPHOS 51  --   BILITOT 0.8  --    Studies/Results: ECHOCARDIOGRAM COMPLETE  Result Date: 05/02/2022    ECHOCARDIOGRAM REPORT   Patient Name:   [name] Date of Exam: 05/02/2022 Medical Rec #:  [id number]       Height:       66.0 in Accession #:    [id number]      Weight:       205.0 lb Date of Birth:  07-30-1928        BSA:          2.021 m Patient Age:    21 years        BP:           139/59 mmHg Patient Gender: M               HR:           77 bpm. Exam Location:  Inpatient Procedure: 2D Echo, Cardiac Doppler, Color Doppler and Intracardiac            Opacification Agent Indications:    CHF  History:        Patient has prior history of Echocardiogram examinations. CHF,                 CAD, Prior CABG, Stroke and Carotid Disease, Aortic Valve                 Disease, Arrythmias:Atrial Fibrillation and Bradycardia; Risk                 Factors:Hypertension, Diabetes and  Dyslipidemia.  Sonographer:    Eartha Inch Referring Phys: [id number] [place]  Sonographer Comments: Technically difficult study due to poor echo windows. Image acquisition challenging due to patient body habitus and Image acquisition challenging due to respiratory motion. IMPRESSIONS  1. Left ventricular ejection fraction, by estimation, is 60 to 65%. The left ventricle has normal function. The left ventricle has no regional wall motion abnormalities. There is mild left ventricular hypertrophy. Left ventricular diastolic parameters are indeterminate.  2. Right ventricular systolic function was not well visualized. The right ventricular size is not well visualized. RV is poorly visualized but function appears moderately reduced  3. The mitral valve is normal in structure. No evidence of mitral valve regurgitation. No evidence of mitral stenosis.  4. Left atrial size was severely dilated.  5. Aortic dilatation noted. There is dilatation of the ascending aorta, measuring 40 mm.  6. The aortic valve is tricuspid. There is severe calcification of the aortic valve. Aortic valve regurgitation is mild. Moderate to severe aortic valve stenosis. Moderate AS by gradients (MG 24 mmHg, Vmax 3.0 m/s), severe by AVA (0.7cm^2) and DI (0.24). Low SV index (28 cc/m^2), suspect paradoxical low flow low gradient severe AS FINDINGS  Left Ventricle: Left ventricular  ejection fraction, by estimation, is 60 to 65%. The left ventricle has normal function. The left ventricle has no regional wall motion abnormalities. Definity contrast agent was given IV to delineate the left ventricular  endocardial borders. The left ventricular internal cavity size was normal in size. There is mild left ventricular hypertrophy. Left ventricular diastolic parameters are indeterminate. Right Ventricle: The right ventricular size is not well visualized. Right vetricular wall thickness was not well visualized. Right ventricular systolic function was not well visualized. Left Atrium: Left atrial size was severely dilated. Right Atrium: Right atrial size was normal in size. Pericardium: There is no evidence of pericardial effusion. Mitral Valve: The mitral valve is normal in structure. No evidence of mitral valve regurgitation. No evidence of mitral valve stenosis. MV peak gradient, 4.8 mmHg. The mean mitral valve gradient is 2.0 mmHg. Tricuspid Valve: The tricuspid valve is normal in structure. Tricuspid valve regurgitation is trivial. Aortic Valve: The aortic valve is tricuspid. There is severe calcifcation of the aortic valve. Aortic valve regurgitation is mild. Severe aortic stenosis is present. Aortic valve mean gradient measures 18.7 mmHg. Aortic valve peak gradient measures 26.4 mmHg. Pulmonic Valve: The pulmonic valve was not well visualized. Pulmonic valve regurgitation is trivial. Aorta: The aortic root is normal in size and structure and aortic dilatation noted. There is dilatation of the ascending aorta, measuring 40 mm. IAS/Shunts: The interatrial septum was not well visualized.  LEFT VENTRICLE PLAX 2D LVIDd:         4.50 cm   Diastology LVIDs:         3.70 cm   LV e' medial:    6.30 cm/s LV PW:         1.10 cm   LV E/e' medial:  17.7 LV IVS:        1.10 cm   LV e' lateral:   6.56 cm/s LVOT diam:     2.00 cm   LV E/e' lateral: 17.0 LVOT Area:     3.14 cm  RIGHT VENTRICLE RV S prime:      5.08 cm/s TAPSE (M-mode): 0.9 cm LEFT ATRIUM            Index        RIGHT ATRIUM  Index LA diam:      4.60 cm  2.28 cm/m   RA Area:     17.10 cm LA Vol (A4C): 101.0 ml 49.97 ml/m  RA Volume:   42.70 ml  21.13 ml/m  AORTIC VALVE AV Vmax:           257.00 cm/s AV Vmean:          210.333 cm/s AV VTI:            0.732 m AV Peak Grad:      26.4 mmHg AV Mean Grad:      18.7 mmHg AR Vena Contracta: 0.30 cm  AORTA Ao Root diam: 3.70 cm Ao Asc diam:  4.00 cm MITRAL VALVE                TRICUSPID VALVE MV Area (PHT): 3.75 cm     TR Peak grad:   15.2 mmHg MV Peak grad:  4.8 mmHg     TR Vmax:        195.00 cm/s MV Mean grad:  2.0 mmHg MV Vmax:       1.10 m/s     SHUNTS MV Vmean:      57.0 cm/s    Systemic Diam: 2.00 cm MV Decel Time: 203 msec MR Peak grad: 25.7 mmHg MR Mean grad: 17.0 mmHg MR Vmax:      253.50 cm/s MR Vmean:     191.0 cm/s MV E velocity: 111.50 cm/s Jeffrey Milian MD Electronically signed by Jeffrey Milian MD Signature Date/Time: 05/02/2022/2:34:13 PM    Final    DG Chest Port 1 View  Result Date: 05/02/2022 CLINICAL DATA:  Shortness of breath EXAM: PORTABLE CHEST 1 VIEW COMPARISON:  12/05/2021 FINDINGS: Prior CABG. Cardiomegaly. No confluent airspace opacities, effusions or edema. No acute bony abnormality. IMPRESSION: Cardiomegaly.  No active disease. Electronically Signed   By: Rolm Baptise M.D.   On: 05/02/2022 00:36     Assessment / Plan:    Assessment: 1.  Iron deficiency anemia with Hemoccult positive stools: In the setting of multiple cardiac issues on Eliquis for A-fib, this has been held since admission with last dose of Eliquis on 05/01/2018 4 PM, history of ongoing Hemoccult positive stools and mildly anemia which has been progressing over the past month, hemoglobin down to 9 from 10 a month ago, per Dr. Collene Mares endoscopic procedures will be performed after Eliquis has been held for 3 days for further evaluation which would be on Wednesday, 05/05/2022 2.  GERD 3.  A-fib  on Eliquis: On hold as above 4.  History of CAD status post CABG in 1990 with subsequent DES to RCA in 2006 5.  Hypertension 6.  Moderate aortic stenosis 7.  Chronic diastolic dysfunction with symptomatic bradycardia  Plan: 1.  Continue to monitor hemoglobin with transfusion as needed less than 7 2.  Plans for EGD/colonoscopy on Wednesday, 05/05/2018 3 days of Eliquis washout. 3.  Continue current diet for now.  Patient be on a clear liquid diet tomorrow and n.p.o. at midnight with movi prep in split dose fashion starting tomorrow  Thank you for your kind consultation, we will continue to follow.   LOS: 0 days   [name]  05/03/2022, 10:01 AM  GI ATTENDING  Interval history data reviewed.  Agree with interval progress note as outlined above.  Patient with iron deficiency anemia and Hemoccult positive stool in the face of chronic anticoagulation therapy.  Plans for GI workup to include colonoscopy and  upper endoscopy after Eliquis washout.  Patient is high risk given his age and comorbidities.  Jeffrey Chuck. Geri [place]., M.D. [facility] Division of Gastroenterology

## 2022-05-03 NOTE — Progress Notes (Signed)
Progress Note  Patient Name: [name] Date of Encounter: 05/03/2022  Primary Cardiologist: Peter Martinique, MD   Subjective   Patient seen examined his bedside.  Inpatient Medications    Scheduled Meds:  atorvastatin  40 mg Oral QHS   cholecalciferol  2,000 Units Oral Daily   dutasteride  0.5 mg Oral QODAY   gabapentin  300 mg Oral TID   insulin aspart  0-15 Units Subcutaneous TID WC   insulin aspart  0-5 Units Subcutaneous QHS   insulin glargine-yfgn  25 Units Subcutaneous Daily   metoprolol tartrate  25 mg Oral BID   pantoprazole  40 mg Oral Q1500   tamsulosin  0.4 mg Oral QODAY   Continuous Infusions:  PRN Meds: acetaminophen **OR** acetaminophen, ondansetron **OR** ondansetron (ZOFRAN) IV   Vital Signs    Vitals:   05/02/22 1119 05/02/22 1836 05/02/22 2102 05/03/22 0340  BP: (!) 139/59 (!) 148/75 (!) 159/80 (!) 154/80  Pulse: 74 84 94 81  Resp: 20 (!) '24 20 20  '$ Temp: 98 F (36.7 C) 97.9 F (36.6 C) 97.8 F (36.6 C) 97.9 F (36.6 C)  TempSrc: Oral Oral Oral Oral  SpO2: 99% 97% 96% 94%  Weight:      Height:        Intake/Output Summary (Last 24 hours) at 05/03/2022 1104 Last data filed at 05/03/2022 0803 Gross per 24 hour  Intake 290 ml  Output --  Net 290 ml   Filed Weights   05/01/22 2305  Weight: 93 kg    Telemetry    A-fib with heart rate 70 the low 100s.- Personally Reviewed  ECG    None today- Personally Reviewed  Physical Exam    General: Comfortable Head: Atraumatic, normal size  Eyes: PEERLA, EOMI  Neck: Supple, normal JVD Cardiac: Normal S1, S2; RRR; no murmurs, rubs, or gallops Lungs: Clear to auscultation bilaterally Abd: Soft, nontender, no hepatomegaly  Ext: warm, no edema Musculoskeletal: No deformities, BUE and BLE strength normal and equal Skin: Warm and dry, no rashes   Neuro: Alert and oriented to person, place, time, and situation, CNII-XII grossly intact, no focal deficits  Psych: Normal mood and affect   Labs     Chemistry Recent Labs  Lab 05/01/22 2340 05/03/22 0332  NA 139 141  K 4.0 3.9  CL 101 105  CO2 27 26  GLUCOSE 219* 91  BUN 25* 22  CREATININE 1.61* 1.16  CALCIUM 8.9 9.0  PROT 6.9  --   ALBUMIN 3.5 3.9  AST 16  --   ALT 12  --   ALKPHOS 51  --   BILITOT 0.8  --   GFRNONAA 40* 59*  ANIONGAP 11 10     Hematology Recent Labs  Lab 05/01/22 2340 05/02/22 0640 05/02/22 1147 05/03/22 0332  WBC 9.4  --   --  9.3  RBC 4.33 4.27  --  4.56  HGB 9.4*  --  9.2* 9.9*  HCT 33.0*  --  31.4* 34.4*  MCV 76.2*  --   --  75.4*  MCH 21.7*  --   --  21.7*  MCHC 28.5*  --   --  28.8*  RDW 19.3*  --   --  18.8*  PLT 242  --   --  264    Cardiac EnzymesNo results for input(s): "TROPONINI" in the last 168 hours. No results for input(s): "TROPIPOC" in the last 168 hours.   BNP Recent Labs  Lab 05/01/22 2340  BNP  317.3*     DDimer No results for input(s): "DDIMER" in the last 168 hours.   Radiology    ECHOCARDIOGRAM COMPLETE  Result Date: 05/02/2022    ECHOCARDIOGRAM REPORT   Patient Name:   JUANITA ZEISLOFT Date of Exam: 05/02/2022 Medical Rec #:  [id number]       Height:       66.0 in Accession #:    [id number]      Weight:       205.0 lb Date of Birth:  [birth year]       BSA:          2.021 m Patient Age:    40 years        BP:           139/59 mmHg Patient Gender: M               HR:           77 bpm. Exam Location:  Inpatient Procedure: 2D Echo, Cardiac Doppler, Color Doppler and Intracardiac            Opacification Agent Indications:    CHF  History:        Patient has prior history of Echocardiogram examinations. CHF,                 CAD, Prior CABG, Stroke and Carotid Disease, Aortic Valve                 Disease, Arrythmias:Atrial Fibrillation and Bradycardia; Risk                 Factors:Hypertension, Diabetes and Dyslipidemia.  Sonographer:    Eartha Inch Referring Phys: [id number] [place]  Sonographer Comments: Technically difficult study due to poor echo  windows. Image acquisition challenging due to patient body habitus and Image acquisition challenging due to respiratory motion. IMPRESSIONS  1. Left ventricular ejection fraction, by estimation, is 60 to 65%. The left ventricle has normal function. The left ventricle has no regional wall motion abnormalities. There is mild left ventricular hypertrophy. Left ventricular diastolic parameters are indeterminate.  2. Right ventricular systolic function was not well visualized. The right ventricular size is not well visualized. RV is poorly visualized but function appears moderately reduced  3. The mitral valve is normal in structure. No evidence of mitral valve regurgitation. No evidence of mitral stenosis.  4. Left atrial size was severely dilated.  5. Aortic dilatation noted. There is dilatation of the ascending aorta, measuring 40 mm.  6. The aortic valve is tricuspid. There is severe calcification of the aortic valve. Aortic valve regurgitation is mild. Moderate to severe aortic valve stenosis. Moderate AS by gradients (MG 24 mmHg, Vmax 3.0 m/s), severe by AVA (0.7cm^2) and DI (0.24). Low SV index (28 cc/m^2), suspect paradoxical low flow low gradient severe AS FINDINGS  Left Ventricle: Left ventricular ejection fraction, by estimation, is 60 to 65%. The left ventricle has normal function. The left ventricle has no regional wall motion abnormalities. Definity contrast agent was given IV to delineate the left ventricular  endocardial borders. The left ventricular internal cavity size was normal in size. There is mild left ventricular hypertrophy. Left ventricular diastolic parameters are indeterminate. Right Ventricle: The right ventricular size is not well visualized. Right vetricular wall thickness was not well visualized. Right ventricular systolic function was not well visualized. Left Atrium: Left atrial size was severely dilated. Right Atrium: Right atrial size was normal in size. Pericardium: There is no  evidence of pericardial effusion. Mitral Valve: The mitral valve is normal in structure. No evidence of mitral valve regurgitation. No evidence of mitral valve stenosis. MV peak gradient, 4.8 mmHg. The mean mitral valve gradient is 2.0 mmHg. Tricuspid Valve: The tricuspid valve is normal in structure. Tricuspid valve regurgitation is trivial. Aortic Valve: The aortic valve is tricuspid. There is severe calcifcation of the aortic valve. Aortic valve regurgitation is mild. Severe aortic stenosis is present. Aortic valve mean gradient measures 18.7 mmHg. Aortic valve peak gradient measures 26.4 mmHg. Pulmonic Valve: The pulmonic valve was not well visualized. Pulmonic valve regurgitation is trivial. Aorta: The aortic root is normal in size and structure and aortic dilatation noted. There is dilatation of the ascending aorta, measuring 40 mm. IAS/Shunts: The interatrial septum was not well visualized.  LEFT VENTRICLE PLAX 2D LVIDd:         4.50 cm   Diastology LVIDs:         3.70 cm   LV e' medial:    6.30 cm/s LV PW:         1.10 cm   LV E/e' medial:  17.7 LV IVS:        1.10 cm   LV e' lateral:   6.56 cm/s LVOT diam:     2.00 cm   LV E/e' lateral: 17.0 LVOT Area:     3.14 cm  RIGHT VENTRICLE RV S prime:     5.08 cm/s TAPSE (M-mode): 0.9 cm LEFT ATRIUM            Index        RIGHT ATRIUM           Index LA diam:      4.60 cm  2.28 cm/m   RA Area:     17.10 cm LA Vol (A4C): 101.0 ml 49.97 ml/m  RA Volume:   42.70 ml  21.13 ml/m  AORTIC VALVE AV Vmax:           257.00 cm/s AV Vmean:          210.333 cm/s AV VTI:            0.732 m AV Peak Grad:      26.4 mmHg AV Mean Grad:      18.7 mmHg AR Vena Contracta: 0.30 cm  AORTA Ao Root diam: 3.70 cm Ao Asc diam:  4.00 cm MITRAL VALVE                TRICUSPID VALVE MV Area (PHT): 3.75 cm     TR Peak grad:   15.2 mmHg MV Peak grad:  4.8 mmHg     TR Vmax:        195.00 cm/s MV Mean grad:  2.0 mmHg MV Vmax:       1.10 m/s     SHUNTS MV Vmean:      57.0 cm/s    Systemic Diam:  2.00 cm MV Decel Time: 203 msec MR Peak grad: 25.7 mmHg MR Mean grad: 17.0 mmHg MR Vmax:      253.50 cm/s MR Vmean:     191.0 cm/s MV E velocity: 111.50 cm/s Oswaldo Milian MD Electronically signed by Oswaldo Milian MD Signature Date/Time: 05/02/2022/2:34:13 PM    Final    DG Chest Port 1 View  Result Date: 05/02/2022 CLINICAL DATA:  Shortness of breath EXAM: PORTABLE CHEST 1 VIEW COMPARISON:  12/05/2021 FINDINGS: Prior CABG. Cardiomegaly. No confluent airspace opacities, effusions or edema. No acute bony abnormality. IMPRESSION:  Cardiomegaly.  No active disease. Electronically Signed   By: Rolm Baptise M.D.   On: 05/02/2022 00:36    Cardiac Studies   TTE 05/02/2022 IMPRESSIONS     1. Left ventricular ejection fraction, by estimation, is 60 to 65%. The  left ventricle has normal function. The left ventricle has no regional  wall motion abnormalities. There is mild left ventricular hypertrophy.  Left ventricular diastolic parameters  are indeterminate.   2. Right ventricular systolic function was not well visualized. The right  ventricular size is not well visualized. RV is poorly visualized but  function appears moderately reduced   3. The mitral valve is normal in structure. No evidence of mitral valve  regurgitation. No evidence of mitral stenosis.   4. Left atrial size was severely dilated.   5. Aortic dilatation noted. There is dilatation of the ascending aorta,  measuring 40 mm.   6. The aortic valve is tricuspid. There is severe calcification of the  aortic valve. Aortic valve regurgitation is mild. Moderate to severe  aortic valve stenosis. Moderate AS by gradients (MG 24 mmHg, Vmax 3.0  m/s), severe by AVA (0.7cm^2) and DI  (0.24). Low SV index (28 cc/m^2), suspect paradoxical low flow low  gradient severe AS   FINDINGS   Left Ventricle: Left ventricular ejection fraction, by estimation, is 60  to 65%. The left ventricle has normal function. The left ventricle has no   regional wall motion abnormalities. Definity contrast agent was given IV  to delineate the left ventricular   endocardial borders. The left ventricular internal cavity size was normal  in size. There is mild left ventricular hypertrophy. Left ventricular  diastolic parameters are indeterminate.   Right Ventricle: The right ventricular size is not well visualized. Right  vetricular wall thickness was not well visualized. Right ventricular  systolic function was not well visualized.   Left Atrium: Left atrial size was severely dilated.   Right Atrium: Right atrial size was normal in size.   Pericardium: There is no evidence of pericardial effusion.   Mitral Valve: The mitral valve is normal in structure. No evidence of  mitral valve regurgitation. No evidence of mitral valve stenosis. MV peak  gradient, 4.8 mmHg. The mean mitral valve gradient is 2.0 mmHg.   Tricuspid Valve: The tricuspid valve is normal in structure. Tricuspid  valve regurgitation is trivial.   Aortic Valve: The aortic valve is tricuspid. There is severe calcifcation  of the aortic valve. Aortic valve regurgitation is mild. Severe aortic  stenosis is present. Aortic valve mean gradient measures 18.7 mmHg. Aortic  valve peak gradient measures 26.4  mmHg.   Pulmonic Valve: The pulmonic valve was not well visualized. Pulmonic valve  regurgitation is trivial.   Aorta: The aortic root is normal in size and structure and aortic  dilatation noted. There is dilatation of the ascending aorta, measuring 40  mm.   IAS/Shunts: The interatrial septum was not well visualized.    Patient Profile     89 y.o. male with history of CAD status post CABG in 1990 with subsequent DES to the RCA in 2006, for atrial fibrillation, cardiovascular heart failure, moderate to severe aortic stenosis, CVA, hypertension, coronary artery disease and hyperlipidemia.  Assessment & Plan    Permanent atrial fibrillation Recent presyncope  episode noted to be multifactorial GI bleeding, dehydration and hypotension in the setting of AV nodal blockers  CAD GI bleed AKI Chronic diastolic heart failure Aortic stenosis  His beta-blocker reinitiated yesterday still  in A-fib rate service yet still not quite controlled.  He is on metoprolol 25 mg twice daily.  Will recommend increasing that slightly to 50 mg twice daily.  Agree with keeping him off the Eliquis in the setting of the GI bleeding. No anginal symptoms-no antiplatelets currently on board if okay with GI we can do aspirin 81 mg daily for his CAD. In terms of his recent GI bleeding GI is following, PPI is on board.  Plan for endoscopy likely Wednesday but will defer to them for timing agree with holding Eliquis 3 days.  Currently does not appear to be in heart failure exacerbation.  Continue to hold Lasix for now.  Will continue to monitor and reassess the need.  Repeat echo showed progressive aortic stenosis.  Mild to moderate in 2023 moderate to severe on echo which was done yesterday.     For questions or updates, please contact [place] Please consult [email or web address] for contact info under Cardiology/STEMI.      Signed, Noreen Mackintosh, DO  05/03/2022, 11:04 AM

## 2022-05-03 NOTE — Progress Notes (Signed)
PROGRESS NOTE [name]  [id number] DOB: [birth year] DOA: 05/01/2022 PCP: [name], MD  Brief Narrative/Hospital Course: 89 y.o. male with medical history significant of DM2, HTN, A.Fib rate controlled and on eliquis, who is high functioning despite age,independent living, does all ADLs presented to the ED with dizziness / lightheadedness and pre-syncopal symptoms after standing last night. Then developed SOB which persisted despite sitting back down. Has been having milder symptoms of lightheadedness and dizziness on standing recently for past weeks now. Pt with known ongoing GIB and progressive blood loss anemia, hemoccult positive stool.  He has been scheduled for colonoscopy with Dr. Lyndel Safe April 10th. Seen in the ED.EMS = SBP in 90s and HRs in 40-60 range.No N/V. No grossly bloody BMs. Patient admitted for lower GI bleeding/postural dizziness with presyncope    Subjective: Patient seen and examined this morning resting comfortably reports 2-3 bowel movement brown denies nausea vomiting Overnight BP stable afebrile heart rate in 80s to 90s Labs fairly stable bmp, CBC w/ hb 9.9 g microcytic.  Assessment and Plan: Principal Problem:   Postural dizziness with presyncope Active Problems:   Bradycardia   Lower GI bleeding   Chronic atrial fibrillation (HCC)   AKI (acute kidney injury) ([place])   Diabetic peripheral neuropathy associated with type 2 diabetes mellitus (HCC)   Chronic diastolic heart failure (HCC)   Stage 3b chronic kidney disease (HCC)   Mild cognitive impairment   Coronary artery disease involving native coronary artery of native heart without angina pectoris   Lower GI bleeding Ongoing Hemoccult positive history Progressive anemia: GI following monitor H&H, planning for luminal evaluation after 2 days washout of Eliquis-continue plan as per GI.  Continue PPI daily. Recent Labs  Lab 05/01/22 2340 05/02/22 1147 05/03/22 0332  HGB 9.4* 9.2* 9.9*  HCT  33.0* 31.4* 34.4*   Chronic atrial fibrillation: Rate controlled/bradycardic on admission.  Continue low-dose metoprolol, continue to hold Cardizem and Eliquis.  Resume as soon as okay with GI post EGD   Moderate aortic stenosis Symptomatic bradycardia with SBPs in the 90s in ER: Cardiology has been consulted Cardizem Metropol initially held and subsequently resumed 25 mg twice daily metoprolol 3/3.  Heart rate in 50s to 1.  Appreciate cardiology input and monitoring daily.TTE 3/3 done-EF 60-65%, no RWMA, left ventricular diastolic valemetostat indeterminate dilation of ascending aorta up to 40 mm moderate to severe aortic valve stenosis moderate AS by gradient> plan per cardiology    AKI on CKD3a; b/l creat  1.2-1.3.  Lasix on hold.  Creatinine improved.  Monitor. Recent Labs    10/10/21 0944 10/11/21 0435 10/12/21 0349 10/22/21 1024 10/30/21 1012 12/05/21 1232 03/11/22 1054 04/07/22 1119 05/01/22 2340 05/03/22 0332  BUN '16 17 19 21 17 20 23 23 '$ 25* 22  CREATININE 1.13 1.12 1.07 1.44* 1.25 1.21 1.37 1.27 1.61* 1.16    MCI: Stable  Chronic diastolic CHF: EuvolemiC.  Lasix held due to AKI, BNP 317 baseline, chest x-ray clear, resume Lasix in the morning  Diabetes mellitus on Jardiance and metformin holding, resume Neurontin, continue Lantus at reduced dose of 25u (he takes 40-42 QAM at home), cont ssi Recent Labs  Lab 05/02/22 0847 05/02/22 1206 05/02/22 1642 05/02/22 2102 05/03/22 0733  GLUCAP 246* 148* 75 103* 84      Class I Obesity:Patient's Body mass index is 33.09 kg/m. : Will benefit with PCP follow-up, weight loss  healthy lifestyle and outpatient sleep evaluation.   DVT prophylaxis: Place and maintain sequential compression device Start: 05/02/22  1644 Code Status:   Code Status: Full Code Family Communication: plan of care discussed with patient at bedside. Patient status is: admitted as observation but remains hospitalized for ongoing  because of anemia and for GI  evaluation  Level of care: Progressive  Dispo: The patient is from: home            Anticipated disposition: hom ~ 2 days Objective: Vitals last 24 hrs: Vitals:   05/02/22 1119 05/02/22 1836 05/02/22 2102 05/03/22 0340  BP: (!) 139/59 (!) 148/75 (!) 159/80 (!) 154/80  Pulse: 74 84 94 81  Resp: 20 (!) '24 20 20  '$ Temp: 98 F (36.7 C) 97.9 F (36.6 C) 97.8 F (36.6 C) 97.9 F (36.6 C)  TempSrc: Oral Oral Oral Oral  SpO2: 99% 97% 96% 94%  Weight:      Height:       Weight change:   Physical Examination: General exam: alert awake, older than stated age HEENT:Oral mucosa moist, Ear/Nose WNL grossly Respiratory system: bilaterally clear BS, no use of accessory muscle Cardiovascular system: S1 & S2 +, No JVD. Gastrointestinal system: Abdomen soft,NT,ND, BS+ Nervous System:Alert, awake, moving extremities. Extremities: LE edema neg,distal peripheral pulses palpable.  Skin: No rashes,no icterus. MSK: Normal muscle bulk,tone, power  Medications reviewed:  Scheduled Meds:  atorvastatin  40 mg Oral QHS   cholecalciferol  2,000 Units Oral Daily   dutasteride  0.5 mg Oral QODAY   insulin aspart  0-15 Units Subcutaneous TID WC   insulin aspart  0-5 Units Subcutaneous QHS   insulin glargine-yfgn  25 Units Subcutaneous Daily   metoprolol tartrate  25 mg Oral BID   pantoprazole  40 mg Oral Q1500   Continuous Infusions:    Diet Order             Diet heart healthy/carb modified Room service appropriate? Yes; Fluid consistency: Thin  Diet effective now                            Intake/Output Summary (Last 24 hours) at 05/03/2022 0856 Last data filed at 05/03/2022 0803 Gross per 24 hour  Intake 290 ml  Output --  Net 290 ml   Net IO Since Admission: 530 mL [05/03/22 0856]  Wt Readings from Last 3 Encounters:  05/01/22 93 kg  04/15/22 93.3 kg  04/07/22 93 kg     Unresulted Labs (From admission, onward)     Start     Ordered   05/03/22 [id number]  Basic metabolic  panel  Daily,   R      05/02/22 0548   05/03/22 0500  CBC  Daily,   R      05/02/22 0558          Data Reviewed: I have personally reviewed following labs and imaging studies CBC: Recent Labs  Lab 05/01/22 2340 05/02/22 1147 05/03/22 0332  WBC 9.4  --  9.3  NEUTROABS 6.7  --  6.1  HGB 9.4* 9.2* 9.9*  HCT 33.0* 31.4* 34.4*  MCV 76.2*  --  75.4*  PLT 242  --  [id number]   Basic Metabolic Panel: Recent Labs  Lab 05/01/22 2340 05/03/22 0332  NA 139 141  K 4.0 3.9  CL 101 105  CO2 27 26  GLUCOSE 219* 91  BUN 25* 22  CREATININE 1.61* 1.16  CALCIUM 8.9 9.0  MG  --  2.1  PHOS  --  4.5   GFR: Estimated Creatinine  Clearance: 44.3 mL/min (by C-G formula based on SCr of 1.16 mg/dL). Liver Function Tests: Recent Labs  Lab 05/01/22 2340 05/03/22 0332  AST 16  --   ALT 12  --   ALKPHOS 51  --   BILITOT 0.8  --   PROT 6.9  --   ALBUMIN 3.5 3.9   No results for input(s): "LIPASE", "AMYLASE" in the last 168 hours. No results for input(s): "AMMONIA" in the last 168 hours. Coagulation Profile: No results for input(s): "INR", "PROTIME" in the last 168 hours. BNP (last 3 results) No results for input(s): "PROBNP" in the last 8760 hours. HbA1C: No results for input(s): "HGBA1C" in the last 72 hours. CBG: Recent Labs  Lab 05/02/22 0847 05/02/22 1206 05/02/22 1642 05/02/22 2102 05/03/22 0733  GLUCAP 246* 148* 75 103* 84   Lipid Profile: No results for input(s): "CHOL", "HDL", "LDLCALC", "TRIG", "CHOLHDL", "LDLDIRECT" in the last 72 hours. Thyroid Function Tests: No results for input(s): "TSH", "T4TOTAL", "FREET4", "T3FREE", "THYROIDAB" in the last 72 hours. Sepsis Labs: No results for input(s): "PROCALCITON", "LATICACIDVEN" in the last 168 hours.  Recent Results (from the past 240 hour(s))  Resp panel by RT-PCR (RSV, Flu A&B, Covid) Anterior Nasal Swab     Status: None   Collection Time: 05/01/22 11:53 PM   Specimen: Anterior Nasal Swab  Result Value Ref Range Status    SARS Coronavirus 2 by RT PCR NEGATIVE NEGATIVE Final    Comment: (NOTE) SARS-CoV-2 target nucleic acids are NOT DETECTED.  The SARS-CoV-2 RNA is generally detectable in upper respiratory specimens during the acute phase of infection. The lowest concentration of SARS-CoV-2 viral copies this assay can detect is 138 copies/mL. A negative result does not preclude SARS-Cov-2 infection and should not be used as the sole basis for treatment or other patient management decisions. A negative result may occur with  improper specimen collection/handling, submission of specimen other than nasopharyngeal swab, presence of viral mutation(s) within the areas targeted by this assay, and inadequate number of viral copies(<138 copies/mL). A negative result must be combined with clinical observations, patient history, and epidemiological information. The expected result is Negative.  Fact Sheet for Patients:  EntrepreneurPulse.com.au  Fact Sheet for Healthcare Providers:  IncredibleEmployment.be  This test is no t yet approved or cleared by the [place] FDA and  has been authorized for detection and/or diagnosis of SARS-CoV-2 by FDA under an Emergency Use Authorization (EUA). This EUA will remain  in effect (meaning this test can be used) for the duration of the COVID-19 declaration under Section 564(b)(1) of the Act, 21 U.S.C.section 360bbb-3(b)(1), unless the authorization is terminated  or revoked sooner.       Influenza A by PCR NEGATIVE NEGATIVE Final   Influenza B by PCR NEGATIVE NEGATIVE Final    Comment: (NOTE) The Xpert Xpress SARS-CoV-2/FLU/RSV plus assay is intended as an aid in the diagnosis of influenza from Nasopharyngeal swab specimens and should not be used as a sole basis for treatment. Nasal washings and aspirates are unacceptable for Xpert Xpress SARS-CoV-2/FLU/RSV testing.  Fact Sheet for  Patients: EntrepreneurPulse.com.au  Fact Sheet for Healthcare Providers: IncredibleEmployment.be  This test is not yet approved or cleared by the [place] FDA and has been authorized for detection and/or diagnosis of SARS-CoV-2 by FDA under an Emergency Use Authorization (EUA). This EUA will remain in effect (meaning this test can be used) for the duration of the COVID-19 declaration under Section 564(b)(1) of the Act, 21 U.S.C. section 360bbb-3(b)(1), unless the  authorization is terminated or revoked.     Resp Syncytial Virus by PCR NEGATIVE NEGATIVE Final    Comment: (NOTE) Fact Sheet for Patients: EntrepreneurPulse.com.au  Fact Sheet for Healthcare Providers: IncredibleEmployment.be  This test is not yet approved or cleared by the [place] FDA and has been authorized for detection and/or diagnosis of SARS-CoV-2 by FDA under an Emergency Use Authorization (EUA). This EUA will remain in effect (meaning this test can be used) for the duration of the COVID-19 declaration under Section 564(b)(1) of the Act, 21 U.S.C. section 360bbb-3(b)(1), unless the authorization is terminated or revoked.  Performed at [facility], [place] [address] [place], [place] [address]     Antimicrobials: Anti-infectives (From admission, onward)    None      Culture/Microbiology    Component Value Date/Time   SDES  10/10/2021 0815    BLOOD BLOOD RIGHT HAND Performed at [facility], [place] [address] [name], [place] [address]    SPECREQUEST  10/10/2021 0815    BOTTLES DRAWN AEROBIC AND ANAEROBIC Blood Culture results may not be optimal due to an inadequate volume of blood received in culture bottles Performed at [facility], [place] [address], [place], [place] [address]    CULT  10/10/2021 0815    NO GROWTH 5 DAYS Performed at [facility] [address] [place], [place] [address]    REPTSTATUS 10/15/2021 FINAL 10/10/2021 0815    Other culture-see note  Radiology Studies: ECHOCARDIOGRAM COMPLETE  Result Date: 05/02/2022    ECHOCARDIOGRAM REPORT   Patient Name:   [name] Date of Exam: 05/02/2022 Medical Rec #:  [id number]       Height:       66.0 in Accession #:    [id number]      Weight:       205.0 lb Date of Birth:  [birth year]       BSA:          2.021 m Patient Age:    55 years        BP:           139/59 mmHg Patient Gender: M               HR:           77 bpm. Exam Location:  Inpatient Procedure: 2D Echo, Cardiac Doppler, Color Doppler and Intracardiac            Opacification Agent Indications:    CHF  History:        Patient has prior history of Echocardiogram examinations. CHF,                 CAD, Prior CABG, Stroke and Carotid Disease, Aortic Valve                 Disease, Arrythmias:Atrial Fibrillation and Bradycardia; Risk                 Factors:Hypertension, Diabetes and Dyslipidemia.  Sonographer:    Eartha Inch Referring Phys: [id number] [place]  Sonographer Comments: Technically difficult study due to poor echo windows. Image acquisition challenging due to patient body habitus and Image acquisition challenging due to respiratory motion. IMPRESSIONS  1. Left ventricular ejection fraction, by estimation, is 60 to 65%. The left ventricle has normal function. The left ventricle has no regional wall motion abnormalities. There is mild left ventricular hypertrophy. Left ventricular diastolic parameters are indeterminate.  2. Right ventricular  systolic function was not well visualized. The right ventricular size is not well visualized. RV is poorly visualized but function appears moderately reduced  3. The mitral valve is normal in structure. No evidence of mitral valve regurgitation. No evidence of mitral stenosis.  4. Left atrial size was severely dilated.  5. Aortic dilatation noted. There is dilatation of the  ascending aorta, measuring 40 mm.  6. The aortic valve is tricuspid. There is severe calcification of the aortic valve. Aortic valve regurgitation is mild. Moderate to severe aortic valve stenosis. Moderate AS by gradients (MG 24 mmHg, Vmax 3.0 m/s), severe by AVA (0.7cm^2) and DI (0.24). Low SV index (28 cc/m^2), suspect paradoxical low flow low gradient severe AS FINDINGS  Left Ventricle: Left ventricular ejection fraction, by estimation, is 60 to 65%. The left ventricle has normal function. The left ventricle has no regional wall motion abnormalities. Definity contrast agent was given IV to delineate the left ventricular  endocardial borders. The left ventricular internal cavity size was normal in size. There is mild left ventricular hypertrophy. Left ventricular diastolic parameters are indeterminate. Right Ventricle: The right ventricular size is not well visualized. Right vetricular wall thickness was not well visualized. Right ventricular systolic function was not well visualized. Left Atrium: Left atrial size was severely dilated. Right Atrium: Right atrial size was normal in size. Pericardium: There is no evidence of pericardial effusion. Mitral Valve: The mitral valve is normal in structure. No evidence of mitral valve regurgitation. No evidence of mitral valve stenosis. MV peak gradient, 4.8 mmHg. The mean mitral valve gradient is 2.0 mmHg. Tricuspid Valve: The tricuspid valve is normal in structure. Tricuspid valve regurgitation is trivial. Aortic Valve: The aortic valve is tricuspid. There is severe calcifcation of the aortic valve. Aortic valve regurgitation is mild. Severe aortic stenosis is present. Aortic valve mean gradient measures 18.7 mmHg. Aortic valve peak gradient measures 26.4 mmHg. Pulmonic Valve: The pulmonic valve was not well visualized. Pulmonic valve regurgitation is trivial. Aorta: The aortic root is normal in size and structure and aortic dilatation noted. There is dilatation of the  ascending aorta, measuring 40 mm. IAS/Shunts: The interatrial septum was not well visualized.  LEFT VENTRICLE PLAX 2D LVIDd:         4.50 cm   Diastology LVIDs:         3.70 cm   LV e' medial:    6.30 cm/s LV PW:         1.10 cm   LV E/e' medial:  17.7 LV IVS:        1.10 cm   LV e' lateral:   6.56 cm/s LVOT diam:     2.00 cm   LV E/e' lateral: 17.0 LVOT Area:     3.14 cm  RIGHT VENTRICLE RV S prime:     5.08 cm/s TAPSE (M-mode): 0.9 cm LEFT ATRIUM            Index        RIGHT ATRIUM           Index LA diam:      4.60 cm  2.28 cm/m   RA Area:     17.10 cm LA Vol (A4C): 101.0 ml 49.97 ml/m  RA Volume:   42.70 ml  21.13 ml/m  AORTIC VALVE AV Vmax:           257.00 cm/s AV Vmean:          210.333 cm/s AV VTI:  0.732 m AV Peak Grad:      26.4 mmHg AV Mean Grad:      18.7 mmHg AR Vena Contracta: 0.30 cm  AORTA Ao Root diam: 3.70 cm Ao Asc diam:  4.00 cm MITRAL VALVE                TRICUSPID VALVE MV Area (PHT): 3.75 cm     TR Peak grad:   15.2 mmHg MV Peak grad:  4.8 mmHg     TR Vmax:        195.00 cm/s MV Mean grad:  2.0 mmHg MV Vmax:       1.10 m/s     SHUNTS MV Vmean:      57.0 cm/s    Systemic Diam: 2.00 cm MV Decel Time: 203 msec MR Peak grad: 25.7 mmHg MR Mean grad: 17.0 mmHg MR Vmax:      253.50 cm/s MR Vmean:     191.0 cm/s MV E velocity: 111.50 cm/s Oswaldo Milian MD Electronically signed by Oswaldo Milian MD Signature Date/Time: 05/02/2022/2:34:13 PM    Final    DG Chest Port 1 View  Result Date: 05/02/2022 CLINICAL DATA:  Shortness of breath EXAM: PORTABLE CHEST 1 VIEW COMPARISON:  12/05/2021 FINDINGS: Prior CABG. Cardiomegaly. No confluent airspace opacities, effusions or edema. No acute bony abnormality. IMPRESSION: Cardiomegaly.  No active disease. Electronically Signed   By: Rolm Baptise M.D.   On: 05/02/2022 00:36     LOS: 0 days   Antonieta Pert, MD [facility]  05/03/2022, 8:56 AM

## 2022-05-04 ENCOUNTER — Encounter (HOSPITAL_COMMUNITY): Payer: Self-pay | Admitting: Internal Medicine

## 2022-05-04 DIAGNOSIS — Z7901 Long term (current) use of anticoagulants: Secondary | ICD-10-CM | POA: Diagnosis not present

## 2022-05-04 DIAGNOSIS — R55 Syncope and collapse: Secondary | ICD-10-CM | POA: Diagnosis not present

## 2022-05-04 DIAGNOSIS — D500 Iron deficiency anemia secondary to blood loss (chronic): Secondary | ICD-10-CM | POA: Diagnosis not present

## 2022-05-04 DIAGNOSIS — R195 Other fecal abnormalities: Secondary | ICD-10-CM | POA: Diagnosis not present

## 2022-05-04 DIAGNOSIS — R42 Dizziness and giddiness: Secondary | ICD-10-CM | POA: Diagnosis not present

## 2022-05-04 LAB — BASIC METABOLIC PANEL
Anion gap: 7 (ref 5–15)
BUN: 19 mg/dL (ref 8–23)
CO2: 27 mmol/L (ref 22–32)
Calcium: 9.2 mg/dL (ref 8.9–10.3)
Chloride: 107 mmol/L (ref 98–111)
Creatinine, Ser: 1.18 mg/dL (ref 0.61–1.24)
GFR, Estimated: 58 mL/min — ABNORMAL LOW (ref >60)
Glucose, Bld: 102 mg/dL — ABNORMAL HIGH (ref 70–99)
Potassium: 4.4 mmol/L (ref 3.5–5.1)
Sodium: 141 mmol/L (ref 135–145)

## 2022-05-04 LAB — CBC
HCT: 32.8 % — ABNORMAL LOW (ref 39.0–52.0)
Hemoglobin: 9.6 g/dL — ABNORMAL LOW (ref 13.0–17.0)
MCH: 22.2 pg — ABNORMAL LOW (ref 26.0–34.0)
MCHC: 29.3 g/dL — ABNORMAL LOW (ref 30.0–36.0)
MCV: 75.9 fL — ABNORMAL LOW (ref 80.0–100.0)
Platelets: 238 10*3/uL (ref 150–400)
RBC: 4.32 MIL/uL (ref 4.22–5.81)
RDW: 18.9 % — ABNORMAL HIGH (ref 11.5–15.5)
WBC: 7.6 10*3/uL (ref 4.0–10.5)
nRBC: 0 % (ref 0.0–0.2)

## 2022-05-04 LAB — GLUCOSE, CAPILLARY
Glucose-Capillary: 112 mg/dL — ABNORMAL HIGH (ref 70–99)
Glucose-Capillary: 182 mg/dL — ABNORMAL HIGH (ref 70–99)
Glucose-Capillary: 102 mg/dL — ABNORMAL HIGH (ref 70–99)
Glucose-Capillary: 94 mg/dL (ref 70–99)

## 2022-05-04 MED ORDER — METOPROLOL TARTRATE 50 MG PO TABS
50.0000 mg | ORAL_TABLET | Freq: Two times a day (BID) | ORAL | Status: DC
  Administered 2022-05-04 – 2022-05-06 (×5): 50 mg via ORAL
  Filled 2022-05-04 (×5): qty 1

## 2022-05-04 NOTE — H&P (View-Only) (Signed)
Progress Note   Subjective  Chief Complaint: Anemia with Hemoccult positive stool  Today, the patient is surrounded by his nurses.  Aware of plans for colonoscopy and endoscopy tomorrow.  He is already on a clear liquid diet and bowel prep orders are in for this afternoon.  He denies any new complaints or concerns.   Objective   Vital signs in last 24 hours: Temp:  [97.6 F (36.4 C)-98.5 F (36.9 C)] 97.7 F (36.5 C) (03/05 0546) Pulse Rate:  [87-95] 95 (03/05 0546) Resp:  [18] 18 (03/04 1218) BP: (140-143)/(78-117) 142/78 (03/05 0546) SpO2:  [97 %] 97 % (03/05 0546) Last BM Date : 05/02/22 General:    white male in NAD Heart:  Regular rate and rhythm; no murmurs Lungs: Respirations even and unlabored, lungs CTA bilaterally Abdomen:  Soft, nontender and nondistended. Normal bowel sounds. Psych:  Cooperative. Normal mood and affect.  Intake/Output from previous day: 03/04 0701 - 03/05 0700 In: 646 [P.O.:646] Out: -  Intake/Output this shift: Total I/O In: 440 [P.O.:440] Out: -   Lab Results: Recent Labs    05/01/22 2340 05/02/22 1147 05/03/22 0332 05/04/22 0424  WBC 9.4  --  9.3 7.6  HGB 9.4* 9.2* 9.9* 9.6*  HCT 33.0* 31.4* 34.4* 32.8*  PLT 242  --  264 238   BMET Recent Labs    05/01/22 2340 05/03/22 0332 05/04/22 0424  NA 139 141 141  K 4.0 3.9 4.4  CL 101 105 107  CO2 '27 26 27  '$ GLUCOSE 219* 91 102*  BUN 25* 22 19  CREATININE 1.61* 1.16 1.18  CALCIUM 8.9 9.0 9.2   LFT Recent Labs    05/01/22 2340 05/03/22 0332  PROT 6.9  --   ALBUMIN 3.5 3.9  AST 16  --   ALT 12  --   ALKPHOS 51  --   BILITOT 0.8  --    Studies/Results: ECHOCARDIOGRAM COMPLETE  Result Date: 05/02/2022    ECHOCARDIOGRAM REPORT   Patient Name:   URIAH KLABUNDE Date of Exam: 05/02/2022 Medical Rec #:  [id number]       Height:       66.0 in Accession #:    [id number]      Weight:       205.0 lb Date of Birth:  September 20, 1928       BSA:          2.021 m Patient Age:    89 years         BP:           139/59 mmHg Patient Gender: M               HR:           77 bpm. Exam Location:  Inpatient Procedure: 2D Echo, Cardiac Doppler, Color Doppler and Intracardiac            Opacification Agent Indications:    CHF  History:        Patient has prior history of Echocardiogram examinations. CHF,                 CAD, Prior CABG, Stroke and Carotid Disease, Aortic Valve                 Disease, Arrythmias:Atrial Fibrillation and Bradycardia; Risk                 Factors:Hypertension, Diabetes and Dyslipidemia.  Sonographer:    Eartha Inch Referring Phys: [id number] [place]  L SCHUMANN  Sonographer Comments: Technically difficult study due to poor echo windows. Image acquisition challenging due to patient body habitus and Image acquisition challenging due to respiratory motion. IMPRESSIONS  1. Left ventricular ejection fraction, by estimation, is 60 to 65%. The left ventricle has normal function. The left ventricle has no regional wall motion abnormalities. There is mild left ventricular hypertrophy. Left ventricular diastolic parameters are indeterminate.  2. Right ventricular systolic function was not well visualized. The right ventricular size is not well visualized. RV is poorly visualized but function appears moderately reduced  3. The mitral valve is normal in structure. No evidence of mitral valve regurgitation. No evidence of mitral stenosis.  4. Left atrial size was severely dilated.  5. Aortic dilatation noted. There is dilatation of the ascending aorta, measuring 40 mm.  6. The aortic valve is tricuspid. There is severe calcification of the aortic valve. Aortic valve regurgitation is mild. Moderate to severe aortic valve stenosis. Moderate AS by gradients (MG 24 mmHg, Vmax 3.0 m/s), severe by AVA (0.7cm^2) and DI (0.24). Low SV index (28 cc/m^2), suspect paradoxical low flow low gradient severe AS FINDINGS  Left Ventricle: Left ventricular ejection fraction, by estimation, is 60 to 65%. The  left ventricle has normal function. The left ventricle has no regional wall motion abnormalities. Definity contrast agent was given IV to delineate the left ventricular  endocardial borders. The left ventricular internal cavity size was normal in size. There is mild left ventricular hypertrophy. Left ventricular diastolic parameters are indeterminate. Right Ventricle: The right ventricular size is not well visualized. Right vetricular wall thickness was not well visualized. Right ventricular systolic function was not well visualized. Left Atrium: Left atrial size was severely dilated. Right Atrium: Right atrial size was normal in size. Pericardium: There is no evidence of pericardial effusion. Mitral Valve: The mitral valve is normal in structure. No evidence of mitral valve regurgitation. No evidence of mitral valve stenosis. MV peak gradient, 4.8 mmHg. The mean mitral valve gradient is 2.0 mmHg. Tricuspid Valve: The tricuspid valve is normal in structure. Tricuspid valve regurgitation is trivial. Aortic Valve: The aortic valve is tricuspid. There is severe calcifcation of the aortic valve. Aortic valve regurgitation is mild. Severe aortic stenosis is present. Aortic valve mean gradient measures 18.7 mmHg. Aortic valve peak gradient measures 26.4 mmHg. Pulmonic Valve: The pulmonic valve was not well visualized. Pulmonic valve regurgitation is trivial. Aorta: The aortic root is normal in size and structure and aortic dilatation noted. There is dilatation of the ascending aorta, measuring 40 mm. IAS/Shunts: The interatrial septum was not well visualized.  LEFT VENTRICLE PLAX 2D LVIDd:         4.50 cm   Diastology LVIDs:         3.70 cm   LV e' medial:    6.30 cm/s LV PW:         1.10 cm   LV E/e' medial:  17.7 LV IVS:        1.10 cm   LV e' lateral:   6.56 cm/s LVOT diam:     2.00 cm   LV E/e' lateral: 17.0 LVOT Area:     3.14 cm  RIGHT VENTRICLE RV S prime:     5.08 cm/s TAPSE (M-mode): 0.9 cm LEFT ATRIUM             Index        RIGHT ATRIUM           Index LA diam:  4.60 cm  2.28 cm/m   RA Area:     17.10 cm LA Vol (A4C): 101.0 ml 49.97 ml/m  RA Volume:   42.70 ml  21.13 ml/m  AORTIC VALVE AV Vmax:           257.00 cm/s AV Vmean:          210.333 cm/s AV VTI:            0.732 m AV Peak Grad:      26.4 mmHg AV Mean Grad:      18.7 mmHg AR Vena Contracta: 0.30 cm  AORTA Ao Root diam: 3.70 cm Ao Asc diam:  4.00 cm MITRAL VALVE                TRICUSPID VALVE MV Area (PHT): 3.75 cm     TR Peak grad:   15.2 mmHg MV Peak grad:  4.8 mmHg     TR Vmax:        195.00 cm/s MV Mean grad:  2.0 mmHg MV Vmax:       1.10 m/s     SHUNTS MV Vmean:      57.0 cm/s    Systemic Diam: 2.00 cm MV Decel Time: 203 msec MR Peak grad: 25.7 mmHg MR Mean grad: 17.0 mmHg MR Vmax:      253.50 cm/s MR Vmean:     191.0 cm/s MV E velocity: 111.50 cm/s Oswaldo Milian MD Electronically signed by Oswaldo Milian MD Signature Date/Time: 05/02/2022/2:34:13 PM    Final        Assessment / Plan:   Assessment: 1.  Iron deficiency anemia with Hemoccult positive stools: In the setting of multiple cardiac issues on Eliquis for A-fib, this has been held since admission with last dose of Eliquis on 05/01/2022, history of ongoing Hemoccult positive stools and mild anemia which has been progressing over the past month, hemoglobin down to 9 from 10 a month ago, plan for EGD and colonoscopy tomorrow 2.  GERD 3.  A-fib on Eliquis: On hold as above 4.  History of CAD status post CABG in 1990 with subsequent DES to RCA in 2006 5.  Hypertension 6.  Moderate aortic stenosis 7.  Chronic diastolic dysfunction with symptomatic bradycardia  Plan: 1.  Continue to monitor hemoglobin with transfusion as needed, though this appears to be stable over the past few days 2.  Plans for EGD and colonoscopy tomorrow with Dr. Henrene Pastor.  Patient is high risk given his age and comorbidities.  He is on a clear liquid diet today and will be n.p.o. at midnight 3.  Patient  will start bowel prep in split dose fashion later this evening  Thank you for your kind consultation, we will continue to follow.    LOS: 0 days   Levin Erp  05/04/2022, 10:38 AM  GI ATTENDING  Interval history data reviewed.  Agree with interval progress note as outlined above.  Patient is for colonoscopy and upper endoscopy to evaluate iron deficiency anemia and Hemoccult positive stool.  This is planned for tomorrow at 1 PM.  His Eliquis will have adequately washed out.  As stated previously, he has higher than average risk due to his age, comorbidities, and the need to address chronic anticoagulation  Docia Chuck. Geri [place]., M.D. [facility] Division of Gastroenterology

## 2022-05-04 NOTE — Progress Notes (Signed)
Progress Note  Patient Name: [name] Date of Encounter: 05/04/2022  Primary Cardiologist: Peter Martinique, MD   Subjective   Patient seen examined his bedside.  Inpatient Medications    Scheduled Meds:  atorvastatin  40 mg Oral QHS   cholecalciferol  2,000 Units Oral Daily   dutasteride  0.5 mg Oral QODAY   gabapentin  300 mg Oral TID   insulin aspart  0-15 Units Subcutaneous TID WC   insulin aspart  0-5 Units Subcutaneous QHS   insulin glargine-yfgn  25 Units Subcutaneous Daily   metoprolol tartrate  25 mg Oral BID   pantoprazole  40 mg Oral Q1500   peg 3350 powder  0.5 kit Oral Once   And   peg 3350 powder  0.5 kit Oral Once   tamsulosin  0.4 mg Oral QODAY   Continuous Infusions:  PRN Meds: acetaminophen **OR** acetaminophen, ondansetron **OR** ondansetron (ZOFRAN) IV   Vital Signs    Vitals:   05/03/22 0340 05/03/22 1218 05/03/22 2107 05/04/22 0546  BP: (!) 154/80 (!) 143/81 (!) 140/117 (!) 142/78  Pulse: 81 87 94 95  Resp: 20 18    Temp: 97.9 F (36.6 C) 97.6 F (36.4 C) 98.5 F (36.9 C) 97.7 F (36.5 C)  TempSrc: Oral Oral Oral   SpO2: 94% 97% 97% 97%  Weight:      Height:        Intake/Output Summary (Last 24 hours) at 05/04/2022 0952 Last data filed at 05/04/2022 0900 Gross per 24 hour  Intake 966 ml  Output --  Net 966 ml   Filed Weights   05/01/22 2305  Weight: 93 kg    Telemetry    A-fib with heart rate 70 the low 100s.- Personally Reviewed  ECG    None today- Personally Reviewed  Physical Exam    General: Comfortable Head: Atraumatic, normal size  Eyes: PEERLA, EOMI  Neck: Supple, normal JVD Cardiac: Normal S1, S2; RRR; no murmurs, rubs, or gallops Lungs: Clear to auscultation bilaterally Abd: Soft, nontender, no hepatomegaly  Ext: warm, no edema Musculoskeletal: No deformities, BUE and BLE strength normal and equal Skin: Warm and dry, no rashes   Neuro: Alert and oriented to person, place, time, and situation, CNII-XII  grossly intact, no focal deficits  Psych: Normal mood and affect   Labs    Chemistry Recent Labs  Lab 05/01/22 2340 05/03/22 0332 05/04/22 0424  NA 139 141 141  K 4.0 3.9 4.4  CL 101 105 107  CO2 '27 26 27  '$ GLUCOSE 219* 91 102*  BUN 25* 22 19  CREATININE 1.61* 1.16 1.18  CALCIUM 8.9 9.0 9.2  PROT 6.9  --   --   ALBUMIN 3.5 3.9  --   AST 16  --   --   ALT 12  --   --   ALKPHOS 51  --   --   BILITOT 0.8  --   --   GFRNONAA 40* 59* 58*  ANIONGAP '11 10 7     '$ Hematology Recent Labs  Lab 05/01/22 2340 05/02/22 0640 05/02/22 1147 05/03/22 0332 05/04/22 0424  WBC 9.4  --   --  9.3 7.6  RBC 4.33 4.27  --  4.56 4.32  HGB 9.4*  --  9.2* 9.9* 9.6*  HCT 33.0*  --  31.4* 34.4* 32.8*  MCV 76.2*  --   --  75.4* 75.9*  MCH 21.7*  --   --  21.7* 22.2*  MCHC 28.5*  --   --  28.8* 29.3*  RDW 19.3*  --   --  18.8* 18.9*  PLT 242  --   --  264 238    Cardiac EnzymesNo results for input(s): "TROPONINI" in the last 168 hours. No results for input(s): "TROPIPOC" in the last 168 hours.   BNP Recent Labs  Lab 05/01/22 2340  BNP 317.3*     DDimer No results for input(s): "DDIMER" in the last 168 hours.   Radiology    ECHOCARDIOGRAM COMPLETE  Result Date: 05/02/2022    ECHOCARDIOGRAM REPORT   Patient Name:   [name] Date of Exam: 05/02/2022 Medical Rec #:  [id number]       Height:       66.0 in Accession #:    [id number]      Weight:       205.0 lb Date of Birth:  December 04, 1928       BSA:          2.021 m Patient Age:    32 years        BP:           139/59 mmHg Patient Gender: M               HR:           77 bpm. Exam Location:  Inpatient Procedure: 2D Echo, Cardiac Doppler, Color Doppler and Intracardiac            Opacification Agent Indications:    CHF  History:        Patient has prior history of Echocardiogram examinations. CHF,                 CAD, Prior CABG, Stroke and Carotid Disease, Aortic Valve                 Disease, Arrythmias:Atrial Fibrillation and Bradycardia;  Risk                 Factors:Hypertension, Diabetes and Dyslipidemia.  Sonographer:    Eartha Inch Referring Phys: [id number] [place]  Sonographer Comments: Technically difficult study due to poor echo windows. Image acquisition challenging due to patient body habitus and Image acquisition challenging due to respiratory motion. IMPRESSIONS  1. Left ventricular ejection fraction, by estimation, is 60 to 65%. The left ventricle has normal function. The left ventricle has no regional wall motion abnormalities. There is mild left ventricular hypertrophy. Left ventricular diastolic parameters are indeterminate.  2. Right ventricular systolic function was not well visualized. The right ventricular size is not well visualized. RV is poorly visualized but function appears moderately reduced  3. The mitral valve is normal in structure. No evidence of mitral valve regurgitation. No evidence of mitral stenosis.  4. Left atrial size was severely dilated.  5. Aortic dilatation noted. There is dilatation of the ascending aorta, measuring 40 mm.  6. The aortic valve is tricuspid. There is severe calcification of the aortic valve. Aortic valve regurgitation is mild. Moderate to severe aortic valve stenosis. Moderate AS by gradients (MG 24 mmHg, Vmax 3.0 m/s), severe by AVA (0.7cm^2) and DI (0.24). Low SV index (28 cc/m^2), suspect paradoxical low flow low gradient severe AS FINDINGS  Left Ventricle: Left ventricular ejection fraction, by estimation, is 60 to 65%. The left ventricle has normal function. The left ventricle has no regional wall motion abnormalities. Definity contrast agent was given IV to delineate the left ventricular  endocardial borders. The left ventricular internal cavity size was normal in size. There  is mild left ventricular hypertrophy. Left ventricular diastolic parameters are indeterminate. Right Ventricle: The right ventricular size is not well visualized. Right vetricular wall thickness was  not well visualized. Right ventricular systolic function was not well visualized. Left Atrium: Left atrial size was severely dilated. Right Atrium: Right atrial size was normal in size. Pericardium: There is no evidence of pericardial effusion. Mitral Valve: The mitral valve is normal in structure. No evidence of mitral valve regurgitation. No evidence of mitral valve stenosis. MV peak gradient, 4.8 mmHg. The mean mitral valve gradient is 2.0 mmHg. Tricuspid Valve: The tricuspid valve is normal in structure. Tricuspid valve regurgitation is trivial. Aortic Valve: The aortic valve is tricuspid. There is severe calcifcation of the aortic valve. Aortic valve regurgitation is mild. Severe aortic stenosis is present. Aortic valve mean gradient measures 18.7 mmHg. Aortic valve peak gradient measures 26.4 mmHg. Pulmonic Valve: The pulmonic valve was not well visualized. Pulmonic valve regurgitation is trivial. Aorta: The aortic root is normal in size and structure and aortic dilatation noted. There is dilatation of the ascending aorta, measuring 40 mm. IAS/Shunts: The interatrial septum was not well visualized.  LEFT VENTRICLE PLAX 2D LVIDd:         4.50 cm   Diastology LVIDs:         3.70 cm   LV e' medial:    6.30 cm/s LV PW:         1.10 cm   LV E/e' medial:  17.7 LV IVS:        1.10 cm   LV e' lateral:   6.56 cm/s LVOT diam:     2.00 cm   LV E/e' lateral: 17.0 LVOT Area:     3.14 cm  RIGHT VENTRICLE RV S prime:     5.08 cm/s TAPSE (M-mode): 0.9 cm LEFT ATRIUM            Index        RIGHT ATRIUM           Index LA diam:      4.60 cm  2.28 cm/m   RA Area:     17.10 cm LA Vol (A4C): 101.0 ml 49.97 ml/m  RA Volume:   42.70 ml  21.13 ml/m  AORTIC VALVE AV Vmax:           257.00 cm/s AV Vmean:          210.333 cm/s AV VTI:            0.732 m AV Peak Grad:      26.4 mmHg AV Mean Grad:      18.7 mmHg AR Vena Contracta: 0.30 cm  AORTA Ao Root diam: 3.70 cm Ao Asc diam:  4.00 cm MITRAL VALVE                TRICUSPID VALVE  MV Area (PHT): 3.75 cm     TR Peak grad:   15.2 mmHg MV Peak grad:  4.8 mmHg     TR Vmax:        195.00 cm/s MV Mean grad:  2.0 mmHg MV Vmax:       1.10 m/s     SHUNTS MV Vmean:      57.0 cm/s    Systemic Diam: 2.00 cm MV Decel Time: 203 msec MR Peak grad: 25.7 mmHg MR Mean grad: 17.0 mmHg MR Vmax:      253.50 cm/s MR Vmean:     191.0 cm/s MV E velocity: 111.50 cm/s  Oswaldo Milian MD Electronically signed by Oswaldo Milian MD Signature Date/Time: 05/02/2022/2:34:13 PM    Final     Cardiac Studies   TTE 05/02/2022 IMPRESSIONS     1. Left ventricular ejection fraction, by estimation, is 60 to 65%. The  left ventricle has normal function. The left ventricle has no regional  wall motion abnormalities. There is mild left ventricular hypertrophy.  Left ventricular diastolic parameters  are indeterminate.   2. Right ventricular systolic function was not well visualized. The right  ventricular size is not well visualized. RV is poorly visualized but  function appears moderately reduced   3. The mitral valve is normal in structure. No evidence of mitral valve  regurgitation. No evidence of mitral stenosis.   4. Left atrial size was severely dilated.   5. Aortic dilatation noted. There is dilatation of the ascending aorta,  measuring 40 mm.   6. The aortic valve is tricuspid. There is severe calcification of the  aortic valve. Aortic valve regurgitation is mild. Moderate to severe  aortic valve stenosis. Moderate AS by gradients (MG 24 mmHg, Vmax 3.0  m/s), severe by AVA (0.7cm^2) and DI  (0.24). Low SV index (28 cc/m^2), suspect paradoxical low flow low  gradient severe AS   FINDINGS   Left Ventricle: Left ventricular ejection fraction, by estimation, is 60  to 65%. The left ventricle has normal function. The left ventricle has no  regional wall motion abnormalities. Definity contrast agent was given IV  to delineate the left ventricular   endocardial borders. The left ventricular  internal cavity size was normal  in size. There is mild left ventricular hypertrophy. Left ventricular  diastolic parameters are indeterminate.   Right Ventricle: The right ventricular size is not well visualized. Right  vetricular wall thickness was not well visualized. Right ventricular  systolic function was not well visualized.   Left Atrium: Left atrial size was severely dilated.   Right Atrium: Right atrial size was normal in size.   Pericardium: There is no evidence of pericardial effusion.   Mitral Valve: The mitral valve is normal in structure. No evidence of  mitral valve regurgitation. No evidence of mitral valve stenosis. MV peak  gradient, 4.8 mmHg. The mean mitral valve gradient is 2.0 mmHg.   Tricuspid Valve: The tricuspid valve is normal in structure. Tricuspid  valve regurgitation is trivial.   Aortic Valve: The aortic valve is tricuspid. There is severe calcifcation  of the aortic valve. Aortic valve regurgitation is mild. Severe aortic  stenosis is present. Aortic valve mean gradient measures 18.7 mmHg. Aortic  valve peak gradient measures 26.4  mmHg.   Pulmonic Valve: The pulmonic valve was not well visualized. Pulmonic valve  regurgitation is trivial.   Aorta: The aortic root is normal in size and structure and aortic  dilatation noted. There is dilatation of the ascending aorta, measuring 40  mm.   IAS/Shunts: The interatrial septum was not well visualized.    Patient Profile     89 y.o. male with history of CAD status post CABG in 1990 with subsequent DES to the RCA in 2006, for atrial fibrillation, cardiovascular heart failure, moderate to severe aortic stenosis, CVA, hypertension, coronary artery disease and hyperlipidemia.  Assessment & Plan    Permanent atrial fibrillation Recent presyncope episode noted to be multifactorial GI bleeding, dehydration and hypotension in the setting of AV nodal blockers  CAD GI bleed AKI Chronic diastolic heart  failure Aortic stenosis  His Still in A-fib rate service  yet still not quite controlled.  He is on metoprolol 25 mg twice daily.  Metoprolol increased today 50 mg twice daily..  Agree with keeping him off the Eliquis in the setting of the GI bleeding. No anginal symptoms-no antiplatelets currently on board if okay with GI we can do aspirin 81 mg daily for his CAD. In terms of his recent GI bleeding GI is following, PPI is on board.  Plan for endoscopy tomorrow.  Currently does not appear to be in heart failure exacerbation.  Continue to hold Lasix for now.  Will continue to monitor and reassess the need.  Repeat echo showed progressive aortic stenosis.  Mild to moderate in 2023 moderate to severe on echo which was done yesterday.     For questions or updates, please contact [place] Please consult [email or web address] for contact info under Cardiology/STEMI.      Signed, Berniece Salines, DO  05/04/2022, 9:52 AM

## 2022-05-04 NOTE — Progress Notes (Addendum)
Progress Note   Subjective  Chief Complaint: Anemia with Hemoccult positive stool  Today, the patient is surrounded by his nurses.  Aware of plans for colonoscopy and endoscopy tomorrow.  He is already on a clear liquid diet and bowel prep orders are in for this afternoon.  He denies any new complaints or concerns.   Objective   Vital signs in last 24 hours: Temp:  [97.6 F (36.4 C)-98.5 F (36.9 C)] 97.7 F (36.5 C) (03/05 0546) Pulse Rate:  [87-95] 95 (03/05 0546) Resp:  [18] 18 (03/04 1218) BP: (140-143)/(78-117) 142/78 (03/05 0546) SpO2:  [97 %] 97 % (03/05 0546) Last BM Date : 05/02/22 General:    white male in NAD Heart:  Regular rate and rhythm; no murmurs Lungs: Respirations even and unlabored, lungs CTA bilaterally Abdomen:  Soft, nontender and nondistended. Normal bowel sounds. Psych:  Cooperative. Normal mood and affect.  Intake/Output from previous day: 03/04 0701 - 03/05 0700 In: 646 [P.O.:646] Out: -  Intake/Output this shift: Total I/O In: 440 [P.O.:440] Out: -   Lab Results: Recent Labs    05/01/22 2340 05/02/22 1147 05/03/22 0332 05/04/22 0424  WBC 9.4  --  9.3 7.6  HGB 9.4* 9.2* 9.9* 9.6*  HCT 33.0* 31.4* 34.4* 32.8*  PLT 242  --  264 238   BMET Recent Labs    05/01/22 2340 05/03/22 0332 05/04/22 0424  NA 139 141 141  K 4.0 3.9 4.4  CL 101 105 107  CO2 '27 26 27  '$ GLUCOSE 219* 91 102*  BUN 25* 22 19  CREATININE 1.61* 1.16 1.18  CALCIUM 8.9 9.0 9.2   LFT Recent Labs    05/01/22 2340 05/03/22 0332  PROT 6.9  --   ALBUMIN 3.5 3.9  AST 16  --   ALT 12  --   ALKPHOS 51  --   BILITOT 0.8  --    Studies/Results: ECHOCARDIOGRAM COMPLETE  Result Date: 05/02/2022    ECHOCARDIOGRAM REPORT   Patient Name:   [name] Date of Exam: 05/02/2022 Medical Rec #:  [id number]       Height:       66.0 in Accession #:    [id number]      Weight:       205.0 lb Date of Birth:  1929/02/15       BSA:          2.021 m Patient Age:    89 years         BP:           139/59 mmHg Patient Gender: M               HR:           77 bpm. Exam Location:  Inpatient Procedure: 2D Echo, Cardiac Doppler, Color Doppler and Intracardiac            Opacification Agent Indications:    CHF  History:        Patient has prior history of Echocardiogram examinations. CHF,                 CAD, Prior CABG, Stroke and Carotid Disease, Aortic Valve                 Disease, Arrythmias:Atrial Fibrillation and Bradycardia; Risk                 Factors:Hypertension, Diabetes and Dyslipidemia.  Sonographer:    Eartha Inch Referring Phys: [id number] Scissors  L SCHUMANN  Sonographer Comments: Technically difficult study due to poor echo windows. Image acquisition challenging due to patient body habitus and Image acquisition challenging due to respiratory motion. IMPRESSIONS  1. Left ventricular ejection fraction, by estimation, is 60 to 65%. The left ventricle has normal function. The left ventricle has no regional wall motion abnormalities. There is mild left ventricular hypertrophy. Left ventricular diastolic parameters are indeterminate.  2. Right ventricular systolic function was not well visualized. The right ventricular size is not well visualized. RV is poorly visualized but function appears moderately reduced  3. The mitral valve is normal in structure. No evidence of mitral valve regurgitation. No evidence of mitral stenosis.  4. Left atrial size was severely dilated.  5. Aortic dilatation noted. There is dilatation of the ascending aorta, measuring 40 mm.  6. The aortic valve is tricuspid. There is severe calcification of the aortic valve. Aortic valve regurgitation is mild. Moderate to severe aortic valve stenosis. Moderate AS by gradients (MG 24 mmHg, Vmax 3.0 m/s), severe by AVA (0.7cm^2) and DI (0.24). Low SV index (28 cc/m^2), suspect paradoxical low flow low gradient severe AS FINDINGS  Left Ventricle: Left ventricular ejection fraction, by estimation, is 60 to 65%. The  left ventricle has normal function. The left ventricle has no regional wall motion abnormalities. Definity contrast agent was given IV to delineate the left ventricular  endocardial borders. The left ventricular internal cavity size was normal in size. There is mild left ventricular hypertrophy. Left ventricular diastolic parameters are indeterminate. Right Ventricle: The right ventricular size is not well visualized. Right vetricular wall thickness was not well visualized. Right ventricular systolic function was not well visualized. Left Atrium: Left atrial size was severely dilated. Right Atrium: Right atrial size was normal in size. Pericardium: There is no evidence of pericardial effusion. Mitral Valve: The mitral valve is normal in structure. No evidence of mitral valve regurgitation. No evidence of mitral valve stenosis. MV peak gradient, 4.8 mmHg. The mean mitral valve gradient is 2.0 mmHg. Tricuspid Valve: The tricuspid valve is normal in structure. Tricuspid valve regurgitation is trivial. Aortic Valve: The aortic valve is tricuspid. There is severe calcifcation of the aortic valve. Aortic valve regurgitation is mild. Severe aortic stenosis is present. Aortic valve mean gradient measures 18.7 mmHg. Aortic valve peak gradient measures 26.4 mmHg. Pulmonic Valve: The pulmonic valve was not well visualized. Pulmonic valve regurgitation is trivial. Aorta: The aortic root is normal in size and structure and aortic dilatation noted. There is dilatation of the ascending aorta, measuring 40 mm. IAS/Shunts: The interatrial septum was not well visualized.  LEFT VENTRICLE PLAX 2D LVIDd:         4.50 cm   Diastology LVIDs:         3.70 cm   LV e' medial:    6.30 cm/s LV PW:         1.10 cm   LV E/e' medial:  17.7 LV IVS:        1.10 cm   LV e' lateral:   6.56 cm/s LVOT diam:     2.00 cm   LV E/e' lateral: 17.0 LVOT Area:     3.14 cm  RIGHT VENTRICLE RV S prime:     5.08 cm/s TAPSE (M-mode): 0.9 cm LEFT ATRIUM             Index        RIGHT ATRIUM           Index LA diam:  4.60 cm  2.28 cm/m   RA Area:     17.10 cm LA Vol (A4C): 101.0 ml 49.97 ml/m  RA Volume:   42.70 ml  21.13 ml/m  AORTIC VALVE AV Vmax:           257.00 cm/s AV Vmean:          210.333 cm/s AV VTI:            0.732 m AV Peak Grad:      26.4 mmHg AV Mean Grad:      18.7 mmHg AR Vena Contracta: 0.30 cm  AORTA Ao Root diam: 3.70 cm Ao Asc diam:  4.00 cm MITRAL VALVE                TRICUSPID VALVE MV Area (PHT): 3.75 cm     TR Peak grad:   15.2 mmHg MV Peak grad:  4.8 mmHg     TR Vmax:        195.00 cm/s MV Mean grad:  2.0 mmHg MV Vmax:       1.10 m/s     SHUNTS MV Vmean:      57.0 cm/s    Systemic Diam: 2.00 cm MV Decel Time: 203 msec MR Peak grad: 25.7 mmHg MR Mean grad: 17.0 mmHg MR Vmax:      253.50 cm/s MR Vmean:     191.0 cm/s MV E velocity: 111.50 cm/s Oswaldo Milian MD Electronically signed by Oswaldo Milian MD Signature Date/Time: 05/02/2022/2:34:13 PM    Final        Assessment / Plan:   Assessment: 1.  Iron deficiency anemia with Hemoccult positive stools: In the setting of multiple cardiac issues on Eliquis for A-fib, this has been held since admission with last dose of Eliquis on 05/01/2022, history of ongoing Hemoccult positive stools and mild anemia which has been progressing over the past month, hemoglobin down to 9 from 10 a month ago, plan for EGD and colonoscopy tomorrow 2.  GERD 3.  A-fib on Eliquis: On hold as above 4.  History of CAD status post CABG in 1990 with subsequent DES to RCA in 2006 5.  Hypertension 6.  Moderate aortic stenosis 7.  Chronic diastolic dysfunction with symptomatic bradycardia  Plan: 1.  Continue to monitor hemoglobin with transfusion as needed, though this appears to be stable over the past few days 2.  Plans for EGD and colonoscopy tomorrow with Dr. Henrene Pastor.  Patient is high risk given his age and comorbidities.  He is on a clear liquid diet today and will be n.p.o. at midnight 3.  Patient  will start bowel prep in split dose fashion later this evening  Thank you for your kind consultation, we will continue to follow.    LOS: 0 days   Levin Erp  05/04/2022, 10:38 AM  GI ATTENDING  Interval history data reviewed.  Agree with interval progress note as outlined above.  Patient is for colonoscopy and upper endoscopy to evaluate iron deficiency anemia and Hemoccult positive stool.  This is planned for tomorrow at 1 PM.  His Eliquis will have adequately washed out.  As stated previously, he has higher than average risk due to his age, comorbidities, and the need to address chronic anticoagulation  Docia Chuck. Geri Seminole., M.D. [facility] of Gastroenterology

## 2022-05-04 NOTE — Progress Notes (Signed)
PROGRESS NOTE [name]  [id number] DOB: [birth year] DOA: 05/01/2022 PCP: [name], MD  Brief Narrative/Hospital Course: 89 y.o. male with medical history significant of DM2, HTN, A.Fib rate controlled and on eliquis, who is high functioning despite age,independent living, does all ADLs presented to the ED with dizziness / lightheadedness and pre-syncopal symptoms after standing last night. Then developed SOB which persisted despite sitting back down. Has been having milder symptoms of lightheadedness and dizziness on standing recently for past weeks now. Pt with known ongoing GIB and progressive blood loss anemia, hemoccult positive stool.  He has been scheduled for colonoscopy with Dr. Lyndel Safe April 10th. Seen in the ED.EMS = SBP in 90s and HRs in 40-60 range.No N/V. No grossly bloody BMs. Patient admitted for lower GI bleeding/postural dizziness with presyncope    Subjective: Seen and examined Resting well Had liquid diet Overnight afebrile BP stable in 0000000 systolic  Labs reviewed hemoglobin stable 9.6 g   Assessment and Plan: Principal Problem:   Postural dizziness with presyncope Active Problems:   Bradycardia   Lower GI bleeding   Chronic atrial fibrillation (HCC)   AKI (acute kidney injury) ([place])   Diabetic peripheral neuropathy associated with type 2 diabetes mellitus (HCC)   Chronic diastolic heart failure (HCC)   Stage 3b chronic kidney disease (HCC)   Mild cognitive impairment   Coronary artery disease involving native coronary artery of native heart without angina pectoris   Chronic anticoagulation   Iron deficiency anemia due to chronic blood loss   Heme positive stool   Lower GI bleeding Ongoing Hemoccult positive history Progressive anemia: GI following closely appreciate input hemoglobin remains stable, plan for EGD and colonoscopy 3/6 after Eliquis washout x 3 days.Continue PPI, diet as per GI. Recent Labs  Lab 05/01/22 2340 05/02/22 1147  05/03/22 0332 05/04/22 0424  HGB 9.4* 9.2* 9.9* 9.6*  HCT 33.0* 31.4* 34.4* 32.8*   Permanent atrial fibrillation: Rate controlled/bradycardic on admission> metoprolol/Cardizem held, resumed 25 twice daily but since rate uncontrolled cardiology increase the dose to 50 twice daily, keeping him off Eliquis for GI workup.  Resume as soon as okay with GI post EGD   Moderate aortic stenosis Symptomatic bradycardia with SBPs in the 90s in ER: Cardiology has been consulted Cardizem [place] initially held and subsequently resumed 25 mg twice daily metoprolol 3/3.  Heart rate in 50s to 1.  Appreciate cardiology input and monitoring daily.TTE 3/3 done-EF 60-65%, no RWMA, left ventricular diastolic valemetostat indeterminate dilation of ascending aorta up to 40 mm moderate to severe aortic valve stenosis moderate AS by gradient> plan per cardiology    AKI on CKD3a; b/l creat  1.2-1.3.  Improved Lasix on hold. Recent Labs    10/11/21 0435 10/12/21 0349 10/22/21 1024 10/30/21 1012 12/05/21 1232 03/11/22 1054 04/07/22 1119 05/01/22 2340 05/03/22 0332 05/04/22 0424  BUN '17 19 21 17 20 23 23 '$ 25* 22 19  CREATININE 1.12 1.07 1.44* 1.25 1.21 1.37 1.27 1.61* 1.16 1.18    MCI: Stable  Chronic diastolic CHF: EuvolemiC.  Lasix held due to AKI, BNP 317 baseline, chest x-ray clear, resume Lasix 1-2 days  Diabetes mellitus on Jardiance and metformin holding.Cont home  Neurontin.  Blood sugar well-controlled, continue Lantus at reduced dose of 25u (he takes 40-42 QAM at home), cont ssi Recent Labs  Lab 05/03/22 0733 05/03/22 1133 05/03/22 1625 05/03/22 2112 05/04/22 0748  GLUCAP 84 146* 86 166* 112*    Class I Obesity:Patient's Body mass index is 33.09 kg/m. :  Will benefit with PCP follow-up, weight loss  healthy lifestyle and outpatient sleep evaluation.   DVT prophylaxis: Place and maintain sequential compression device Start: 05/02/22 1644 Code Status:   Code Status: Full Code Family  Communication: plan of care discussed with patient at bedside. Patient status is: admitted as observation but remains hospitalized for ongoing  because of anemia and for GI evaluation  Level of care: Progressive  Dispo: The patient is from: home            Anticipated disposition: hom ~ 2 days Objective: Vitals last 24 hrs: Vitals:   05/03/22 0340 05/03/22 1218 05/03/22 2107 05/04/22 0546  BP: (!) 154/80 (!) 143/81 (!) 140/117 (!) 142/78  Pulse: 81 87 94 95  Resp: 20 18    Temp: 97.9 F (36.6 C) 97.6 F (36.4 C) 98.5 F (36.9 C) 97.7 F (36.5 C)  TempSrc: Oral Oral Oral   SpO2: 94% 97% 97% 97%  Weight:      Height:       Weight change:   Physical Examination: General exam: AA, weak,older appearing HEENT:Oral mucosa moist, Ear/Nose WNL grossly, dentition normal. Respiratory system: bilaterally clear BS, no use of accessory muscle Cardiovascular system: S1 & S2 +, regular rate. Gastrointestinal system: Abdomen soft, NT,ND,BS+ Nervous System:Alert, awake, moving extremities and grossly nonfocal Extremities: LE ankle edema neg, lower extremities warm Skin: No rashes,no icterus. MSK: Normal muscle bulk,tone, power   Medications reviewed:  Scheduled Meds:  atorvastatin  40 mg Oral QHS   cholecalciferol  2,000 Units Oral Daily   dutasteride  0.5 mg Oral QODAY   gabapentin  300 mg Oral TID   insulin aspart  0-15 Units Subcutaneous TID WC   insulin aspart  0-5 Units Subcutaneous QHS   insulin glargine-yfgn  25 Units Subcutaneous Daily   metoprolol tartrate  25 mg Oral BID   pantoprazole  40 mg Oral Q1500   peg 3350 powder  0.5 kit Oral Once   And   peg 3350 powder  0.5 kit Oral Once   tamsulosin  0.4 mg Oral QODAY   Continuous Infusions:    Diet Order             Diet NPO time specified Except for: Sips with Meds  Diet effective midnight           Diet clear liquid Room service appropriate? Yes; Fluid consistency: Thin  Diet effective 0500 tomorrow                    Intake/Output Summary (Last 24 hours) at 05/04/2022 0913 Last data filed at 05/04/2022 0600 Gross per 24 hour  Intake 526 ml  Output --  Net 526 ml   Net IO Since Admission: 1,056 mL [05/04/22 0913]  Wt Readings from Last 3 Encounters:  05/01/22 93 kg  04/15/22 93.3 kg  04/07/22 93 kg     Unresulted Labs (From admission, onward)     Start     Ordered   05/03/22 [id number]  Basic metabolic panel  Daily,   R      05/02/22 0548   05/03/22 0500  CBC  Daily,   R      05/02/22 0558          Data Reviewed: I have personally reviewed following labs and imaging studies CBC: Recent Labs  Lab 05/01/22 2340 05/02/22 1147 05/03/22 0332 05/04/22 0424  WBC 9.4  --  9.3 7.6  NEUTROABS 6.7  --  6.1  --  HGB 9.4* 9.2* 9.9* 9.6*  HCT 33.0* 31.4* 34.4* 32.8*  MCV 76.2*  --  75.4* 75.9*  PLT 242  --  264 [id number]   Basic Metabolic Panel: Recent Labs  Lab 05/01/22 2340 05/03/22 0332 05/04/22 0424  NA 139 141 141  K 4.0 3.9 4.4  CL 101 105 107  CO2 '27 26 27  '$ GLUCOSE 219* 91 102*  BUN 25* 22 19  CREATININE 1.61* 1.16 1.18  CALCIUM 8.9 9.0 9.2  MG  --  2.1  --   PHOS  --  4.5  --    GFR: Estimated Creatinine Clearance: 43.5 mL/min (by C-G formula based on SCr of 1.18 mg/dL). Liver Function Tests: Recent Labs  Lab 05/01/22 2340 05/03/22 0332  AST 16  --   ALT 12  --   ALKPHOS 51  --   BILITOT 0.8  --   PROT 6.9  --   ALBUMIN 3.5 3.9   CBG: Recent Labs  Lab 05/03/22 0733 05/03/22 1133 05/03/22 1625 05/03/22 2112 05/04/22 0748  GLUCAP 84 146* 86 166* 112*   Recent Results (from the past 240 hour(s))  Resp panel by RT-PCR (RSV, Flu A&B, Covid) Anterior Nasal Swab     Status: None   Collection Time: 05/01/22 11:53 PM   Specimen: Anterior Nasal Swab  Result Value Ref Range Status   SARS Coronavirus 2 by RT PCR NEGATIVE NEGATIVE Final    Comment: (NOTE) SARS-CoV-2 target nucleic acids are NOT DETECTED.  The SARS-CoV-2 RNA is generally detectable in upper  respiratory specimens during the acute phase of infection. The lowest concentration of SARS-CoV-2 viral copies this assay can detect is 138 copies/mL. A negative result does not preclude SARS-Cov-2 infection and should not be used as the sole basis for treatment or other patient management decisions. A negative result may occur with  improper specimen collection/handling, submission of specimen other than nasopharyngeal swab, presence of viral mutation(s) within the areas targeted by this assay, and inadequate number of viral copies(<138 copies/mL). A negative result must be combined with clinical observations, patient history, and epidemiological information. The expected result is Negative.  Fact Sheet for Patients:  EntrepreneurPulse.com.au  Fact Sheet for Healthcare Providers:  IncredibleEmployment.be  This test is no t yet approved or cleared by the [place] FDA and  has been authorized for detection and/or diagnosis of SARS-CoV-2 by FDA under an Emergency Use Authorization (EUA). This EUA will remain  in effect (meaning this test can be used) for the duration of the COVID-19 declaration under Section 564(b)(1) of the Act, 21 U.S.C.section 360bbb-3(b)(1), unless the authorization is terminated  or revoked sooner.       Influenza A by PCR NEGATIVE NEGATIVE Final   Influenza B by PCR NEGATIVE NEGATIVE Final    Comment: (NOTE) The Xpert Xpress SARS-CoV-2/FLU/RSV plus assay is intended as an aid in the diagnosis of influenza from Nasopharyngeal swab specimens and should not be used as a sole basis for treatment. Nasal washings and aspirates are unacceptable for Xpert Xpress SARS-CoV-2/FLU/RSV testing.  Fact Sheet for Patients: EntrepreneurPulse.com.au  Fact Sheet for Healthcare Providers: IncredibleEmployment.be  This test is not yet approved or cleared by the [place] FDA and has been  authorized for detection and/or diagnosis of SARS-CoV-2 by FDA under an Emergency Use Authorization (EUA). This EUA will remain in effect (meaning this test can be used) for the duration of the COVID-19 declaration under Section 564(b)(1) of the Act, 21 U.S.C. section 360bbb-3(b)(1), unless the authorization is  terminated or revoked.     Resp Syncytial Virus by PCR NEGATIVE NEGATIVE Final    Comment: (NOTE) Fact Sheet for Patients: EntrepreneurPulse.com.au  Fact Sheet for Healthcare Providers: IncredibleEmployment.be  This test is not yet approved or cleared by the [place] FDA and has been authorized for detection and/or diagnosis of SARS-CoV-2 by FDA under an Emergency Use Authorization (EUA). This EUA will remain in effect (meaning this test can be used) for the duration of the COVID-19 declaration under Section 564(b)(1) of the Act, 21 U.S.C. section 360bbb-3(b)(1), unless the authorization is terminated or revoked.  Performed at [facility], [place] [address] [place], [place] [address]     Antimicrobials: Anti-infectives (From admission, onward)    None      Culture/Microbiology    Component Value Date/Time   SDES  10/10/2021 0815    BLOOD BLOOD RIGHT HAND Performed at [facility], [place] [address], [place], [place] [address]    SPECREQUEST  10/10/2021 0815    BOTTLES DRAWN AEROBIC AND ANAEROBIC Blood Culture results may not be optimal due to an inadequate volume of blood received in culture bottles Performed at [facility], [place] [address] [place], [place] [address]    CULT  10/10/2021 0815    NO GROWTH 5 DAYS Performed at [place] [address], [place], [place] [address]    REPTSTATUS 10/15/2021 FINAL 10/10/2021 0815    Other culture-see note  Radiology Studies: ECHOCARDIOGRAM COMPLETE  Result Date: 05/02/2022    ECHOCARDIOGRAM REPORT   Patient  Name:   WINFIELD SVETLIK Date of Exam: 05/02/2022 Medical Rec #:  [id number]       Height:       66.0 in Accession #:    [id number]      Weight:       205.0 lb Date of Birth:  04/14/1928       BSA:          2.021 m Patient Age:    42 years        BP:           139/59 mmHg Patient Gender: M               HR:           77 bpm. Exam Location:  Inpatient Procedure: 2D Echo, Cardiac Doppler, Color Doppler and Intracardiac            Opacification Agent Indications:    CHF  History:        Patient has prior history of Echocardiogram examinations. CHF,                 CAD, Prior CABG, Stroke and Carotid Disease, Aortic Valve                 Disease, Arrythmias:Atrial Fibrillation and Bradycardia; Risk                 Factors:Hypertension, Diabetes and Dyslipidemia.  Sonographer:    Eartha Inch Referring Phys: [id number] [place]  Sonographer Comments: Technically difficult study due to poor echo windows. Image acquisition challenging due to patient body habitus and Image acquisition challenging due to respiratory motion. IMPRESSIONS  1. Left ventricular ejection fraction, by estimation, is 60 to 65%. The left ventricle has normal function. The left ventricle has no regional wall motion abnormalities. There is mild left ventricular hypertrophy. Left ventricular diastolic parameters are indeterminate.  2. Right ventricular systolic function  was not well visualized. The right ventricular size is not well visualized. RV is poorly visualized but function appears moderately reduced  3. The mitral valve is normal in structure. No evidence of mitral valve regurgitation. No evidence of mitral stenosis.  4. Left atrial size was severely dilated.  5. Aortic dilatation noted. There is dilatation of the ascending aorta, measuring 40 mm.  6. The aortic valve is tricuspid. There is severe calcification of the aortic valve. Aortic valve regurgitation is mild. Moderate to severe aortic valve stenosis. Moderate AS by gradients  (MG 24 mmHg, Vmax 3.0 m/s), severe by AVA (0.7cm^2) and DI (0.24). Low SV index (28 cc/m^2), suspect paradoxical low flow low gradient severe AS FINDINGS  Left Ventricle: Left ventricular ejection fraction, by estimation, is 60 to 65%. The left ventricle has normal function. The left ventricle has no regional wall motion abnormalities. Definity contrast agent was given IV to delineate the left ventricular  endocardial borders. The left ventricular internal cavity size was normal in size. There is mild left ventricular hypertrophy. Left ventricular diastolic parameters are indeterminate. Right Ventricle: The right ventricular size is not well visualized. Right vetricular wall thickness was not well visualized. Right ventricular systolic function was not well visualized. Left Atrium: Left atrial size was severely dilated. Right Atrium: Right atrial size was normal in size. Pericardium: There is no evidence of pericardial effusion. Mitral Valve: The mitral valve is normal in structure. No evidence of mitral valve regurgitation. No evidence of mitral valve stenosis. MV peak gradient, 4.8 mmHg. The mean mitral valve gradient is 2.0 mmHg. Tricuspid Valve: The tricuspid valve is normal in structure. Tricuspid valve regurgitation is trivial. Aortic Valve: The aortic valve is tricuspid. There is severe calcifcation of the aortic valve. Aortic valve regurgitation is mild. Severe aortic stenosis is present. Aortic valve mean gradient measures 18.7 mmHg. Aortic valve peak gradient measures 26.4 mmHg. Pulmonic Valve: The pulmonic valve was not well visualized. Pulmonic valve regurgitation is trivial. Aorta: The aortic root is normal in size and structure and aortic dilatation noted. There is dilatation of the ascending aorta, measuring 40 mm. IAS/Shunts: The interatrial septum was not well visualized.  LEFT VENTRICLE PLAX 2D LVIDd:         4.50 cm   Diastology LVIDs:         3.70 cm   LV e' medial:    6.30 cm/s LV PW:          1.10 cm   LV E/e' medial:  17.7 LV IVS:        1.10 cm   LV e' lateral:   6.56 cm/s LVOT diam:     2.00 cm   LV E/e' lateral: 17.0 LVOT Area:     3.14 cm  RIGHT VENTRICLE RV S prime:     5.08 cm/s TAPSE (M-mode): 0.9 cm LEFT ATRIUM            Index        RIGHT ATRIUM           Index LA diam:      4.60 cm  2.28 cm/m   RA Area:     17.10 cm LA Vol (A4C): 101.0 ml 49.97 ml/m  RA Volume:   42.70 ml  21.13 ml/m  AORTIC VALVE AV Vmax:           257.00 cm/s AV Vmean:          210.333 cm/s AV VTI:  0.732 m AV Peak Grad:      26.4 mmHg AV Mean Grad:      18.7 mmHg AR Vena Contracta: 0.30 cm  AORTA Ao Root diam: 3.70 cm Ao Asc diam:  4.00 cm MITRAL VALVE                TRICUSPID VALVE MV Area (PHT): 3.75 cm     TR Peak grad:   15.2 mmHg MV Peak grad:  4.8 mmHg     TR Vmax:        195.00 cm/s MV Mean grad:  2.0 mmHg MV Vmax:       1.10 m/s     SHUNTS MV Vmean:      57.0 cm/s    Systemic Diam: 2.00 cm MV Decel Time: 203 msec MR Peak grad: 25.7 mmHg MR Mean grad: 17.0 mmHg MR Vmax:      253.50 cm/s MR Vmean:     191.0 cm/s MV E velocity: 111.50 cm/s Oswaldo Milian MD Electronically signed by Oswaldo Milian MD Signature Date/Time: 05/02/2022/2:34:13 PM    Final      LOS: 0 days   Antonieta Pert, MD [facility]  05/04/2022, 9:13 AM

## 2022-05-05 ENCOUNTER — Observation Stay (HOSPITAL_BASED_OUTPATIENT_CLINIC_OR_DEPARTMENT_OTHER): Payer: Medicare Other | Admitting: Anesthesiology

## 2022-05-05 ENCOUNTER — Encounter (HOSPITAL_COMMUNITY)
Admission: EM | Disposition: A | Discharge status: Home / Self Care | Payer: Self-pay | Source: Home / Self Care | Attending: Emergency Medicine

## 2022-05-05 ENCOUNTER — Observation Stay (HOSPITAL_COMMUNITY): Payer: Medicare Other | Admitting: Anesthesiology

## 2022-05-05 ENCOUNTER — Encounter (HOSPITAL_COMMUNITY): Payer: Self-pay | Admitting: Internal Medicine

## 2022-05-05 DIAGNOSIS — I2510 Atherosclerotic heart disease of native coronary artery without angina pectoris: Secondary | ICD-10-CM

## 2022-05-05 DIAGNOSIS — D500 Iron deficiency anemia secondary to blood loss (chronic): Secondary | ICD-10-CM | POA: Diagnosis not present

## 2022-05-05 DIAGNOSIS — R195 Other fecal abnormalities: Secondary | ICD-10-CM | POA: Diagnosis not present

## 2022-05-05 DIAGNOSIS — K5730 Diverticulosis of large intestine without perforation or abscess without bleeding: Secondary | ICD-10-CM

## 2022-05-05 DIAGNOSIS — D123 Benign neoplasm of transverse colon: Secondary | ICD-10-CM

## 2022-05-05 DIAGNOSIS — D122 Benign neoplasm of ascending colon: Secondary | ICD-10-CM | POA: Diagnosis not present

## 2022-05-05 DIAGNOSIS — R42 Dizziness and giddiness: Secondary | ICD-10-CM | POA: Diagnosis not present

## 2022-05-05 DIAGNOSIS — D120 Benign neoplasm of cecum: Secondary | ICD-10-CM

## 2022-05-05 DIAGNOSIS — R55 Syncope and collapse: Secondary | ICD-10-CM | POA: Diagnosis not present

## 2022-05-05 DIAGNOSIS — K449 Diaphragmatic hernia without obstruction or gangrene: Secondary | ICD-10-CM

## 2022-05-05 DIAGNOSIS — K621 Rectal polyp: Secondary | ICD-10-CM

## 2022-05-05 DIAGNOSIS — I5032 Chronic diastolic (congestive) heart failure: Secondary | ICD-10-CM | POA: Diagnosis not present

## 2022-05-05 DIAGNOSIS — D509 Iron deficiency anemia, unspecified: Secondary | ICD-10-CM

## 2022-05-05 DIAGNOSIS — I482 Chronic atrial fibrillation, unspecified: Secondary | ICD-10-CM | POA: Diagnosis not present

## 2022-05-05 DIAGNOSIS — D128 Benign neoplasm of rectum: Secondary | ICD-10-CM

## 2022-05-05 DIAGNOSIS — K922 Gastrointestinal hemorrhage, unspecified: Secondary | ICD-10-CM | POA: Diagnosis not present

## 2022-05-05 DIAGNOSIS — D125 Benign neoplasm of sigmoid colon: Secondary | ICD-10-CM

## 2022-05-05 DIAGNOSIS — D124 Benign neoplasm of descending colon: Secondary | ICD-10-CM

## 2022-05-05 DIAGNOSIS — N179 Acute kidney failure, unspecified: Secondary | ICD-10-CM | POA: Diagnosis not present

## 2022-05-05 DIAGNOSIS — Z87891 Personal history of nicotine dependence: Secondary | ICD-10-CM

## 2022-05-05 DIAGNOSIS — G3184 Mild cognitive impairment, so stated: Secondary | ICD-10-CM | POA: Diagnosis not present

## 2022-05-05 DIAGNOSIS — I10 Essential (primary) hypertension: Secondary | ICD-10-CM

## 2022-05-05 DIAGNOSIS — R001 Bradycardia, unspecified: Secondary | ICD-10-CM | POA: Diagnosis not present

## 2022-05-05 DIAGNOSIS — K5140 Inflammatory polyps of colon without complications: Secondary | ICD-10-CM | POA: Diagnosis not present

## 2022-05-05 DIAGNOSIS — Z7901 Long term (current) use of anticoagulants: Secondary | ICD-10-CM | POA: Diagnosis not present

## 2022-05-05 HISTORY — PX: ESOPHAGOGASTRODUODENOSCOPY (EGD) WITH PROPOFOL: SHX5813

## 2022-05-05 HISTORY — PX: COLONOSCOPY WITH PROPOFOL: SHX5780

## 2022-05-05 HISTORY — PX: POLYPECTOMY: SHX5525

## 2022-05-05 LAB — CBC
HCT: 32.1 % — ABNORMAL LOW (ref 39.0–52.0)
Hemoglobin: 9.4 g/dL — ABNORMAL LOW (ref 13.0–17.0)
MCH: 22 pg — ABNORMAL LOW (ref 26.0–34.0)
MCHC: 29.3 g/dL — ABNORMAL LOW (ref 30.0–36.0)
MCV: 75 fL — ABNORMAL LOW (ref 80.0–100.0)
Platelets: 236 10*3/uL (ref 150–400)
RBC: 4.28 MIL/uL (ref 4.22–5.81)
RDW: 18.9 % — ABNORMAL HIGH (ref 11.5–15.5)
WBC: 7.9 10*3/uL (ref 4.0–10.5)
nRBC: 0 % (ref 0.0–0.2)

## 2022-05-05 LAB — GLUCOSE, CAPILLARY
Glucose-Capillary: 71 mg/dL (ref 70–99)
Glucose-Capillary: 144 mg/dL — ABNORMAL HIGH (ref 70–99)
Glucose-Capillary: 209 mg/dL — ABNORMAL HIGH (ref 70–99)

## 2022-05-05 LAB — BASIC METABOLIC PANEL
Anion gap: 5 (ref 5–15)
BUN: 16 mg/dL (ref 8–23)
CO2: 24 mmol/L (ref 22–32)
Calcium: 8.6 mg/dL — ABNORMAL LOW (ref 8.9–10.3)
Chloride: 112 mmol/L — ABNORMAL HIGH (ref 98–111)
Creatinine, Ser: 1.08 mg/dL (ref 0.61–1.24)
GFR, Estimated: >60 mL/min (ref >60)
Glucose, Bld: 78 mg/dL (ref 70–99)
Potassium: 3.5 mmol/L (ref 3.5–5.1)
Sodium: 141 mmol/L (ref 135–145)

## 2022-05-05 SURGERY — ESOPHAGOGASTRODUODENOSCOPY (EGD) WITH PROPOFOL
Anesthesia: Monitor Anesthesia Care

## 2022-05-05 MED ORDER — LACTATED RINGERS IV SOLN: INTRAVENOUS | Status: DC | PRN

## 2022-05-05 MED ORDER — EPHEDRINE SULFATE (PRESSORS) 50 MG/ML IJ SOLN
INTRAMUSCULAR | Status: DC | PRN
  Administered 2022-05-05: 5 mg via INTRAVENOUS

## 2022-05-05 MED ORDER — SODIUM CHLORIDE 0.9 % IV SOLN: INTRAVENOUS | Status: DC

## 2022-05-05 MED ORDER — OXYCODONE HCL 5 MG/5ML PO SOLN: 5.0000 mg | Freq: Once | ORAL | Status: DC | PRN

## 2022-05-05 MED ORDER — PROMETHAZINE HCL 25 MG/ML IJ SOLN: 6.2500 mg | INTRAMUSCULAR | Status: DC | PRN

## 2022-05-05 MED ORDER — HYDROMORPHONE HCL 1 MG/ML IJ SOLN: 0.2500 mg | INTRAMUSCULAR | Status: DC | PRN

## 2022-05-05 MED ORDER — ONDANSETRON HCL 4 MG/2ML IJ SOLN
INTRAMUSCULAR | Status: DC | PRN
  Administered 2022-05-05: 4 mg via INTRAVENOUS

## 2022-05-05 MED ORDER — PROPOFOL 500 MG/50ML IV EMUL
INTRAVENOUS | Status: AC
  Filled 2022-05-05: qty 50

## 2022-05-05 MED ORDER — OXYCODONE HCL 5 MG PO TABS: 5.0000 mg | ORAL_TABLET | Freq: Once | ORAL | Status: DC | PRN

## 2022-05-05 MED ORDER — PROPOFOL 500 MG/50ML IV EMUL
INTRAVENOUS | Status: DC | PRN
  Administered 2022-05-05: 10 mg via INTRAVENOUS
  Administered 2022-05-05: 115 ug/kg/min via INTRAVENOUS

## 2022-05-05 MED ORDER — LIDOCAINE HCL (CARDIAC) PF 100 MG/5ML IV SOSY
PREFILLED_SYRINGE | INTRAVENOUS | Status: DC | PRN
  Administered 2022-05-05: 60 mg via INTRAVENOUS

## 2022-05-05 MED ORDER — PHENYLEPHRINE HCL-NACL 20-0.9 MG/250ML-% IV SOLN
INTRAVENOUS | Status: DC | PRN
  Administered 2022-05-05: 55 ug/min via INTRAVENOUS

## 2022-05-05 MED ORDER — PHENYLEPHRINE HCL (PRESSORS) 10 MG/ML IV SOLN
INTRAVENOUS | Status: DC | PRN
  Administered 2022-05-05 (×3): 120 ug via INTRAVENOUS
  Administered 2022-05-05: 80 ug via INTRAVENOUS
  Administered 2022-05-05 (×2): 120 ug via INTRAVENOUS
  Administered 2022-05-05: 80 ug via INTRAVENOUS

## 2022-05-05 SURGICAL SUPPLY — 25 items

## 2022-05-05 NOTE — Progress Notes (Addendum)
PT Cancellation Note  Patient Details Name: [name] MRN: [id number] DOB: Jul 12, 1928   Cancelled Treatment:    Reason Eval/Treat Not Completed:  Pt just returned to room from procedure. Will likely hold PT eval until tomorrow-unless planning d/c home today.    [facility]  Office: [phone]

## 2022-05-05 NOTE — Anesthesia Preprocedure Evaluation (Signed)
Anesthesia Evaluation  Patient identified by MRN, date of birth, ID band Patient awake    Reviewed: Allergy & Precautions, H&P , NPO status , Patient's Chart, lab work & pertinent test results  Airway Mallampati: II  TM Distance: >3 FB Neck ROM: Full    Dental no notable dental hx.    Pulmonary neg pulmonary ROS, former smoker   Pulmonary exam normal breath sounds clear to auscultation       Cardiovascular hypertension, Pt. on medications + CAD  Normal cardiovascular exam Rhythm:Regular Rate:Normal     Neuro/Psych CVA  negative psych ROS   GI/Hepatic Neg liver ROS,GERD  ,,  Endo/Other  negative endocrine ROSdiabetes, Type 2    Renal/GU Renal InsufficiencyRenal disease  negative genitourinary   Musculoskeletal  (+) Arthritis , Osteoarthritis,    Abdominal  (+) + obese  Peds negative pediatric ROS (+)  Hematology  (+) Blood dyscrasia, anemia   Anesthesia Other Findings   Reproductive/Obstetrics negative OB ROS                             Anesthesia Physical Anesthesia Plan  ASA: 3  Anesthesia Plan: MAC   Post-op Pain Management: Minimal or no pain anticipated   Induction: Intravenous  PONV Risk Score and Plan: 1 and Ondansetron and Treatment may vary due to age or medical condition  Airway Management Planned: Simple Face Mask  Additional Equipment:   Intra-op Plan:   Post-operative Plan:   Informed Consent: I have reviewed the patients History and Physical, chart, labs and discussed the procedure including the risks, benefits and alternatives for the proposed anesthesia with the patient or authorized representative who has indicated his/her understanding and acceptance.     Dental advisory given  Plan Discussed with: CRNA  Anesthesia Plan Comments:        Anesthesia Quick Evaluation

## 2022-05-05 NOTE — Op Note (Signed)
[facility] Patient Name: [name] Procedure Date: 05/05/2022 MRN: [id number] Attending MD: Docia Chuck. Henrene Pastor , MD, [id number] Date of Birth: 04/23/1928 CSN: [id number] Age: 89 Admit Type: Inpatient Procedure:                Upper GI endoscopy Indications:              Iron deficiency anemia, Heme positive stool Providers:                Docia Chuck. Henrene Pastor, MD, Jaci Carrel, RN, William Dalton, Technician Referring MD:             Hospitalist Medicines:                Monitored Anesthesia Care Complications:            No immediate complications. Estimated Blood Loss:     Estimated blood loss: none. Procedure:                Pre-Anesthesia Assessment:                           - Prior to the procedure, a History and Physical                            was performed, and patient medications and                            allergies were reviewed. The patient's tolerance of                            previous anesthesia was also reviewed. The risks                            and benefits of the procedure and the sedation                            options and risks were discussed with the patient.                            All questions were answered, and informed consent                            was obtained. Prior Anticoagulants: The patient has                            taken Eliquis (apixaban), last dose was 4 days                            prior to procedure. ASA Grade Assessment: III - A                            patient with severe systemic disease. After  reviewing the risks and benefits, the patient was                            deemed in satisfactory condition to undergo the                            procedure.                           After obtaining informed consent, the endoscope was                            passed under direct vision. Throughout the                            procedure, the  patient's blood pressure, pulse, and                            oxygen saturations were monitored continuously. The                            [id number] Olympus endoscope was introduced                            through the mouth, and advanced to the second part                            of duodenum. The upper GI endoscopy was                            accomplished without difficulty. The patient                            tolerated the procedure well. Scope In: Scope Out: Findings:      The esophagus was normal.      The stomach was normal. Moderate hiatal hernia. No Cameron erosions      The examined duodenum was normal.      The cardia and gastric fundus were normal on retroflexion. Impression:               - Normal esophagus.                           - Normal stomach. Moderate hiatal hernia. No                            Cameron erosions                           - Normal examined duodenum.                           - No specimens collected. Moderate Sedation:      none Recommendation:           - Patient has a contact number available for  emergencies. The signs and symptoms of potential                            delayed complications were discussed with the                            patient. Return to normal activities tomorrow.                            Written discharge instructions were provided to the                            patient.                           - Resume previous diet.                           - Continue present medications.                           -Hold Eliquis for 1 week then resume.                           -Daily iron supplement                           -Case discussed with patient and his wife.                            Procedure reports provided. Will sign off Procedure Code(s):        --- Professional ---                           [id number], Esophagogastroduodenoscopy, flexible,                             transoral; diagnostic, including collection of                            specimen(s) by brushing or washing, when performed                            (separate procedure) Diagnosis Code(s):        --- Professional ---                           D50.9, Iron deficiency anemia, unspecified                           R19.5, Other fecal abnormalities CPT copyright 2022 [employer]. All rights reserved. The codes documented in this report are preliminary and upon coder review may  be revised to meet current compliance requirements. Docia Chuck. Henrene Pastor, MD 05/05/2022 1:54:02 PM This report has been signed electronically. Number of Addenda: 0

## 2022-05-05 NOTE — Progress Notes (Signed)
PROGRESS NOTE    [name]  [id number] DOB: June 19, 1928 DOA: 05/01/2022 PCP: [name], MD   Brief Narrative:  89 y.o. male with medical history significant of DM2, HTN, A.Fib rate controlled and on eliquis, who is high functioning despite age,independent living, does all ADLs presented to the ED with dizziness / lightheadedness and pre-syncopal symptoms after standing last night. Then developed SOB which persisted despite sitting back down. Has been having milder symptoms of lightheadedness and dizziness on standing recently for past weeks now. Pt with known ongoing GIB and progressive blood loss anemia, hemoccult positive stool.  He has been scheduled for colonoscopy with Dr. Lyndel Safe April 10th. Seen in the ED.EMS = SBP in 90s and HRs in 40-60 range.No N/V. No grossly bloody BMs. Patient admitted for lower GI bleeding/postural dizziness with presyncope   **Further workup done as below and GI feels that the patient's iron deficiency and Hemoccult positive stool was likely due to chronic blood loss from large polyps.  Assessment and Plan: * Postural dizziness with presyncope -Orthostatic hypotension and presyncope due to either orsening anemia and accelerating blood loss from known GIB and or symptomatic bradycardia in setting of BB and CCB use for A.Fib rate control. -See Below -ECHO as below -PT/OT to further Evaluate and Treat  Lower GI Bleeding Ongoing Hemoccult positive history Progressive anemia -Pt with ongoing hemoccult positive stools, mild anemia that has been slowly progressive over past month.  HGB down to 9.x from 10.x 1 month ago. -Pt already has colonoscopy scheduled on 4/10 it looks like with Dr. Lyndel Safe. -GI following closely appreciate input hemoglobin remains stable, plan for EGD and colonoscopy 3/6 after Eliquis washout x 3 days. -Continue PPI, diet as per GI. -Anemia Panel checked -Hgb/Hct Trend: Recent Labs  Lab 04/07/22 1119 05/01/22 2340  05/02/22 1147 05/03/22 0332 05/04/22 0424 05/05/22 0433  HGB 10.5* 9.4* 9.2* 9.9* 9.6* 9.4*  HCT 34.2* 33.0* 31.4* 34.4* 32.8* 32.1*  MCV 71.0* 76.2*  --  75.4* 75.9* 75.0*  -Continue to Monitor for S/Sx of Bleeding -EGD done today and showed a normal esophagus, normal stomach, moderate hiatal hernia, no Cameron lesions, normal examined duodenum and no specimens collected -Colonoscopy done and showed seven 4 to 15 mm polyps in the rectum and in the sigmoid colon and the descending colon as well as in the transverse colon and the ascending colon and the cecum were all removed with a cold snare.  Is already resected retrieved and there is diverticulosis in the sigmoid colon.  Otherwise examination was normal on direct and retroflexion views.  The GI physician feels that the iron deficiency anemia and Hemoccult positive stool likely due to chronic blood loss from large polyps  Bradycardia, improved  -Pt now with bradycardia, suspected to be symptomatic bradycardia with SBPs in the 90s in ER this morning. -EDP discussed with cards and had held cardizem and metoprolol -Cardiology has now resumed BB as below   AKI (Acute Kidney Injury) ([place]) AKI on CKD 3 with creat 1.6 today up from 1.2-1.3 baseline. -BUN/Cr Trend: Recent Labs  Lab 04/07/22 1119 05/01/22 2340 05/03/22 0332 05/04/22 0424 05/05/22 0433  BUN 23 25* '22 19 16  '$ CREATININE 1.27 1.61* 1.16 1.18 1.08  -Avoid Nephrotoxic Medications, Contrast Dyes, Hypotension and Dehydration to Ensure Adequate Renal Perfusion and will need to Renally Adjust Meds -Continue to Monitor and Trend Renal Function carefully and repeat CMP in the AM   Permanent/Chronic atrial fibrillation (HCC) -Continue holding Apixaban in setting of GIB. -  Was Holding BB and CCB on Admisison due to Bradycardia/Rate Control. -His beta-blocker was initiated and was uptitrated by cardiology due to uncontrolled rates and is now on Metropol Tartrate 50 mg p.o. twice  daily -Resume Anticoagulation as soon as okay with GI post EGD   Moderate aortic stenosis Symptomatic bradycardia with SBPs in the 90s in ER:  -Cardiology has been consulted Cardizem Metropol initially held and subsequently resumed 25 mg twice daily metoprolol 3/3 and uptitrated to 50 mg po BId.  Heart rate  now ranging from 79-110  -Appreciate cardiology input and monitoring  -TTE 3/3 done-EF 60-65%, no RWMA, left ventricular diastolic valemetostat indeterminate dilation of ascending aorta up to 40 mm moderate to severe aortic valve stenosis moderate AS by gradient -Further Plan per Cardiology  Mild cognitive impairment -Very mild.  Seems quite sharp and oriented on evaluation.  Chronic diastolic heart failure (HCC) -Was Holding home lasix in setting of AKI -BNP 317 which is about his baseline today. -No pulm edema on CXR.  Intake/Output Summary (Last 24 hours) at 05/05/2022 1600 Last data filed at 05/05/2022 1311 Gross per 24 hour  Intake 0 ml  Output --  Net 0 ml  -Strict I's and O's and Daily Weights -Continue to Monitor for S/Sx of Volume Overload -Repeat CXR int eh AM   Diabetic peripheral neuropathy associated with type 2 diabetes mellitus ([place]) -Hold home Jardiance and Metformin -Held lantus at reduced dose of 25u today (he takes 40-42 QAM at home) given NPO Status -C/w Moderate Novolog SSI AC/HS  Obesity -Complicates overall prognosis and care -Estimated body mass index is 33.09 kg/m as calculated from the following:   Height as of this encounter: '5\' 6"'$  (1.676 m).   Weight as of this encounter: 93 kg.  -Weight Loss and Dietary Counseling given -Will benefit with PCP follow-up, weight loss healthy lifestyle and outpatient sleep evaluation.   DVT prophylaxis: Place and maintain sequential compression device Start: 05/02/22 1644    Code Status: Full Code Family Communication: No family present at bedside   Disposition Plan:  Level of care: Progressive Status is:  Observation The patient will require care spanning > 2 midnights and should be moved to inpatient because: Needs further clinical improvement and clearance by GI   Consultants:  Gastroenterology  Procedures:  COLONOSCOPY Findings:      Seven polyps were found in the rectum, sigmoid colon, descending colon,       transverse colon, ascending colon and cecum. The polyps were 4 to 15 mm       in size. These polyps were removed with a cold snare. Resection and       retrieval were complete. The largest lesion was in the cecum. See images.      Diverticula were found in the sigmoid colon.      The exam was otherwise without abnormality on direct and retroflexion       views. The colon was redundant. Impression:               - Seven 4 to 15 mm polyps in the rectum, in the                            sigmoid colon, in the descending colon, in the                            transverse colon, in the ascending  colon and in the                            cecum, removed with a cold snare. Resected and                            retrieved.                           - Diverticulosis in the sigmoid colon.                           - The examination was otherwise normal on direct                            and retroflexion views. Redundant colon.                           - Iron deficiency anemia and Hemoccult positive                            stool likely due to chronic blood loss from larger                            polyps.  EGD indings:      The esophagus was normal.      The stomach was normal. Moderate hiatal hernia. No Cameron erosions      The examined duodenum was normal.      The cardia and gastric fundus were normal on retroflexion. Impression:               - Normal esophagus.                           - Normal stomach. Moderate hiatal hernia. No                            Cameron erosions                           - Normal examined duodenum.                           - No  specimens collected. Antimicrobials:  Anti-infectives (From admission, onward)    None       Subjective: Seen and examined at bedside he is sitting in the chair waiting for his endoscopy to be done.  No chest pain or shortness of breath.  Feels okay.  Was little irritated that his TV was not working properly.  No other concerns or complaints at this time but felt a little hungry.  Objective: Vitals:   05/05/22 1405 05/05/22 1410 05/05/22 1420 05/05/22 1448  BP: (!) 126/55 126/65 119/63 123/71  Pulse: (!) 106 (!) 102 (!) 102 93  Resp: '16 14 15 20  '$ Temp:    97.6 F (36.4 C)  TempSrc:    Axillary  SpO2: 100% 100% 94% 97%  Weight:      Height:        Intake/Output Summary (Last 24  hours) at 05/05/2022 1555 Last data filed at 05/05/2022 1311 Gross per 24 hour  Intake 0 ml  Output --  Net 0 ml   Filed Weights   05/01/22 2305  Weight: 93 kg   Examination: Physical Exam:  Constitutional: WN/WD elderly Caucasian in distress Respiratory: Diminished to auscultation bilaterally with coarse breath sounds, no wheezing, rales, rhonchi or crackles. Normal respiratory effort and patient is not tachypenic. No accessory muscle use.  Unlabored breathing Cardiovascular: RRR, no murmurs / rubs / gallops. S1 and S2 auscultated. No extremity edema.  Abdomen: Soft, non-tender, distended secondary body habitus. Bowel sounds positive.  GU: Deferred. Musculoskeletal: No clubbing / cyanosis of digits/nails. No joint deformity upper and lower extremities.  Skin: No rashes, lesions, ulcers limited skin evaluation. No induration; Warm and dry.  Neurologic: CN 2-12 grossly intact with no focal deficits. Romberg sign cerebellar reflexes not assessed.  Psychiatric: Normal judgment and insight. Alert and oriented x 3. Normal mood and appropriate affect.   Data Reviewed: I have personally reviewed following labs and imaging studies  CBC: Recent Labs  Lab 05/01/22 2340 05/02/22 1147 05/03/22 0332  05/04/22 0424 05/05/22 0433  WBC 9.4  --  9.3 7.6 7.9  NEUTROABS 6.7  --  6.1  --   --   HGB 9.4* 9.2* 9.9* 9.6* 9.4*  HCT 33.0* 31.4* 34.4* 32.8* 32.1*  MCV 76.2*  --  75.4* 75.9* 75.0*  PLT 242  --  264 238 [id number]   Basic Metabolic Panel: Recent Labs  Lab 05/01/22 2340 05/03/22 0332 05/04/22 0424 05/05/22 0433  NA 139 141 141 141  K 4.0 3.9 4.4 3.5  CL 101 105 107 112*  CO2 '27 26 27 24  '$ GLUCOSE 219* 91 102* 78  BUN 25* '22 19 16  '$ CREATININE 1.61* 1.16 1.18 1.08  CALCIUM 8.9 9.0 9.2 8.6*  MG  --  2.1  --   --   PHOS  --  4.5  --   --    GFR: Estimated Creatinine Clearance: 47.6 mL/min (by C-G formula based on SCr of 1.08 mg/dL). Liver Function Tests: Recent Labs  Lab 05/01/22 2340 05/03/22 0332  AST 16  --   ALT 12  --   ALKPHOS 51  --   BILITOT 0.8  --   PROT 6.9  --   ALBUMIN 3.5 3.9   No results for input(s): "LIPASE", "AMYLASE" in the last 168 hours. No results for input(s): "AMMONIA" in the last 168 hours. Coagulation Profile: No results for input(s): "INR", "PROTIME" in the last 168 hours. Cardiac Enzymes: No results for input(s): "CKTOTAL", "CKMB", "CKMBINDEX", "TROPONINI" in the last 168 hours. BNP (last 3 results) No results for input(s): "PROBNP" in the last 8760 hours. HbA1C: No results for input(s): "HGBA1C" in the last 72 hours. CBG: Recent Labs  Lab 05/04/22 0748 05/04/22 1125 05/04/22 1703 05/04/22 2123 05/05/22 0743  GLUCAP 112* 182* 102* 94 71   Lipid Profile: No results for input(s): "CHOL", "HDL", "LDLCALC", "TRIG", "CHOLHDL", "LDLDIRECT" in the last 72 hours. Thyroid Function Tests: No results for input(s): "TSH", "T4TOTAL", "FREET4", "T3FREE", "THYROIDAB" in the last 72 hours. Anemia Panel: No results for input(s): "VITAMINB12", "FOLATE", "FERRITIN", "TIBC", "IRON", "RETICCTPCT" in the last 72 hours. Sepsis Labs: No results for input(s): "PROCALCITON", "LATICACIDVEN" in the last 168 hours.  Recent Results (from the past 240  hour(s))  Resp panel by RT-PCR (RSV, Flu A&B, Covid) Anterior Nasal Swab     Status: None   Collection Time: 05/01/22 11:53  PM   Specimen: Anterior Nasal Swab  Result Value Ref Range Status   SARS Coronavirus 2 by RT PCR NEGATIVE NEGATIVE Final    Comment: (NOTE) SARS-CoV-2 target nucleic acids are NOT DETECTED.  The SARS-CoV-2 RNA is generally detectable in upper respiratory specimens during the acute phase of infection. The lowest concentration of SARS-CoV-2 viral copies this assay can detect is 138 copies/mL. A negative result does not preclude SARS-Cov-2 infection and should not be used as the sole basis for treatment or other patient management decisions. A negative result may occur with  improper specimen collection/handling, submission of specimen other than nasopharyngeal swab, presence of viral mutation(s) within the areas targeted by this assay, and inadequate number of viral copies(<138 copies/mL). A negative result must be combined with clinical observations, patient history, and epidemiological information. The expected result is Negative.  Fact Sheet for Patients:  EntrepreneurPulse.com.au  Fact Sheet for Healthcare Providers:  IncredibleEmployment.be  This test is no t yet approved or cleared by the [place] FDA and  has been authorized for detection and/or diagnosis of SARS-CoV-2 by FDA under an Emergency Use Authorization (EUA). This EUA will remain  in effect (meaning this test can be used) for the duration of the COVID-19 declaration under Section 564(b)(1) of the Act, 21 U.S.C.section 360bbb-3(b)(1), unless the authorization is terminated  or revoked sooner.       Influenza A by PCR NEGATIVE NEGATIVE Final   Influenza B by PCR NEGATIVE NEGATIVE Final    Comment: (NOTE) The Xpert Xpress SARS-CoV-2/FLU/RSV plus assay is intended as an aid in the diagnosis of influenza from Nasopharyngeal swab specimens and should not  be used as a sole basis for treatment. Nasal washings and aspirates are unacceptable for Xpert Xpress SARS-CoV-2/FLU/RSV testing.  Fact Sheet for Patients: EntrepreneurPulse.com.au  Fact Sheet for Healthcare Providers: IncredibleEmployment.be  This test is not yet approved or cleared by the [place] FDA and has been authorized for detection and/or diagnosis of SARS-CoV-2 by FDA under an Emergency Use Authorization (EUA). This EUA will remain in effect (meaning this test can be used) for the duration of the COVID-19 declaration under Section 564(b)(1) of the Act, 21 U.S.C. section 360bbb-3(b)(1), unless the authorization is terminated or revoked.     Resp Syncytial Virus by PCR NEGATIVE NEGATIVE Final    Comment: (NOTE) Fact Sheet for Patients: EntrepreneurPulse.com.au  Fact Sheet for Healthcare Providers: IncredibleEmployment.be  This test is not yet approved or cleared by the [place] FDA and has been authorized for detection and/or diagnosis of SARS-CoV-2 by FDA under an Emergency Use Authorization (EUA). This EUA will remain in effect (meaning this test can be used) for the duration of the COVID-19 declaration under Section 564(b)(1) of the Act, 21 U.S.C. section 360bbb-3(b)(1), unless the authorization is terminated or revoked.  Performed at [facility], [place] [address] [place], [place] [address]     Radiology Studies: No results found.  Scheduled Meds:  atorvastatin  40 mg Oral QHS   cholecalciferol  2,000 Units Oral Daily   dutasteride  0.5 mg Oral QODAY   gabapentin  300 mg Oral TID   insulin aspart  0-15 Units Subcutaneous TID WC   insulin aspart  0-5 Units Subcutaneous QHS   insulin glargine-yfgn  25 Units Subcutaneous Daily   metoprolol tartrate  50 mg Oral BID   pantoprazole  40 mg Oral Q1500   tamsulosin  0.4 mg Oral QODAY   Continuous Infusions:    LOS: 0 days  Raiford Noble, DO [facility] Available via Epic secure chat 7am-7pm After these hours, please refer to coverage provider listed on [email or web address] 05/05/2022, 3:55 PM

## 2022-05-05 NOTE — Transfer of Care (Signed)
Immediate Anesthesia Transfer of Care Note  Patient: [name]  Procedure(s) Performed: Procedure(s): ESOPHAGOGASTRODUODENOSCOPY (EGD) WITH PROPOFOL (N/A) COLONOSCOPY WITH PROPOFOL (N/A) POLYPECTOMY  Patient Location: PACU  Anesthesia Type:MAC  Level of Consciousness:  sedated, patient cooperative and responds to stimulation  Airway & Oxygen Therapy:Patient Spontanous Breathing and Patient connected to face mask oxgen  Post-op Assessment:  Report given to PACU RN and Post -op Vital signs reviewed and stable  Post vital signs:  Reviewed and stable  Last Vitals:  Vitals:   05/05/22 1134 05/05/22 1359  BP: (!) 162/77   Pulse:  99  Resp: (!) 24   Temp: (!) 36.3 C   SpO2: 0000000     Complications: No apparent anesthesia complications

## 2022-05-05 NOTE — Op Note (Signed)
[facility] Patient Name: [name] Procedure Date: 05/05/2022 MRN: [id number] Attending MD: Docia Chuck. Henrene Pastor , MD, [id number] Date of Birth: 1928-11-27 CSN: [id number] Age: 89 Admit Type: Inpatient Procedure:                Colonoscopy with cold snare polypectomy x 7 Indications:              Heme positive stool, Iron deficiency anemia Providers:                Docia Chuck. Henrene Pastor, MD, Jaci Carrel, RN, William Dalton, Technician Referring MD:             [facility] Medicines:                Monitored Anesthesia Care Complications:            No immediate complications. Estimated blood loss:                            None. Estimated Blood Loss:     Estimated blood loss: none. Procedure:                Pre-Anesthesia Assessment:                           - Prior to the procedure, a History and Physical                            was performed, and patient medications and                            allergies were reviewed. The patient's tolerance of                            previous anesthesia was also reviewed. The risks                            and benefits of the procedure and the sedation                            options and risks were discussed with the patient.                            All questions were answered, and informed consent                            was obtained. Prior Anticoagulants: The patient has                            taken Eliquis (apixaban), last dose was 4 days                            prior to procedure. ASA Grade Assessment: III - A  patient with severe systemic disease. After                            reviewing the risks and benefits, the patient was                            deemed in satisfactory condition to undergo the                            procedure.                           After obtaining informed consent, the colonoscope                            was  passed under direct vision. Throughout the                            procedure, the patient's blood pressure, pulse, and                            oxygen saturations were monitored continuously. The                            [id number]) Olympus colonoscope was                            introduced through the anus and advanced to the the                            cecum, identified by appendiceal orifice and                            ileocecal valve. The ileocecal valve, appendiceal                            orifice, and rectum were photographed. The quality                            of the bowel preparation was excellent. The                            colonoscopy was performed without difficulty. The                            patient tolerated the procedure well. The bowel                            preparation used was MoviPrep via split dose                            instruction. Scope In: 12:53:40 PM Scope Out: 1:30:29 PM Scope Withdrawal Time: 0 hours 27 minutes 30 seconds  Total Procedure Duration: 0 hours 36 minutes 49 seconds  Findings:  Seven polyps were found in the rectum, sigmoid colon, descending colon,       transverse colon, ascending colon and cecum. The polyps were 4 to 15 mm       in size. These polyps were removed with a cold snare. Resection and       retrieval were complete. The largest lesion was in the cecum. See images.      Diverticula were found in the sigmoid colon.      The exam was otherwise without abnormality on direct and retroflexion       views. The colon was redundant. Impression:               - Seven 4 to 15 mm polyps in the rectum, in the                            sigmoid colon, in the descending colon, in the                            transverse colon, in the ascending colon and in the                            cecum, removed with a cold snare. Resected and                            retrieved.                           -  Diverticulosis in the sigmoid colon.                           - The examination was otherwise normal on direct                            and retroflexion views. Redundant colon.                           - Iron deficiency anemia and Hemoccult positive                            stool likely due to chronic blood loss from larger                            polyps. Moderate Sedation:      none Recommendation:           - Repeat colonoscopy is not recommended for                            surveillance.                           - Patient has a contact number available for                            emergencies. The signs and symptoms of potential  delayed complications were discussed with the                            patient. Return to normal activities tomorrow.                            Written discharge instructions were provided to the                            patient.                           - Resume previous diet.                           - Continue present medications.                           - Await pathology results.                           - Hold anticoagulation for now. Would resume                            anticoagulation in 1 week Procedure Code(s):        --- Professional ---                           [phone], Colonoscopy, flexible; with removal of                            tumor(s), polyp(s), or other lesion(s) by snare                            technique Diagnosis Code(s):        --- Professional ---                           D12.8, Benign neoplasm of rectum                           D12.5, Benign neoplasm of sigmoid colon                           D12.4, Benign neoplasm of descending colon                           D12.3, Benign neoplasm of transverse colon (hepatic                            flexure or splenic flexure)                           D12.2, Benign neoplasm of ascending colon                           D12.0, Benign  neoplasm of cecum  R19.5, Other fecal abnormalities                           D50.9, Iron deficiency anemia, unspecified                           K57.30, Diverticulosis of large intestine without                            perforation or abscess without bleeding CPT copyright 2022 American Medical Association. All rights reserved. The codes documented in this report are preliminary and upon coder review may  be revised to meet current compliance requirements. Docia Chuck. Henrene Pastor, MD 05/05/2022 1:51:17 PM This report has been signed electronically. Number of Addenda: 0

## 2022-05-05 NOTE — Interval H&P Note (Signed)
History and Physical Interval Note:  05/05/2022 12:18 PM  [name]  has presented today for surgery, with the diagnosis of IDA.  The various methods of treatment have been discussed with the patient and family. After consideration of risks, benefits and other options for treatment, the patient has consented to  Procedure(s): ESOPHAGOGASTRODUODENOSCOPY (EGD) WITH PROPOFOL (N/A) COLONOSCOPY WITH PROPOFOL (N/A) as a surgical intervention.  The patient's history has been reviewed, patient examined, no change in status, stable for surgery.  I have reviewed the patient's chart and labs.  Questions were answered to the patient's satisfaction.     Scarlette Shorts

## 2022-05-06 DIAGNOSIS — E1142 Type 2 diabetes mellitus with diabetic polyneuropathy: Secondary | ICD-10-CM | POA: Diagnosis not present

## 2022-05-06 DIAGNOSIS — D500 Iron deficiency anemia secondary to blood loss (chronic): Secondary | ICD-10-CM | POA: Diagnosis not present

## 2022-05-06 DIAGNOSIS — G3184 Mild cognitive impairment, so stated: Secondary | ICD-10-CM | POA: Diagnosis not present

## 2022-05-06 DIAGNOSIS — R195 Other fecal abnormalities: Secondary | ICD-10-CM | POA: Diagnosis not present

## 2022-05-06 DIAGNOSIS — R001 Bradycardia, unspecified: Secondary | ICD-10-CM | POA: Diagnosis not present

## 2022-05-06 DIAGNOSIS — I482 Chronic atrial fibrillation, unspecified: Secondary | ICD-10-CM | POA: Diagnosis not present

## 2022-05-06 DIAGNOSIS — R42 Dizziness and giddiness: Secondary | ICD-10-CM | POA: Diagnosis not present

## 2022-05-06 DIAGNOSIS — N1832 Chronic kidney disease, stage 3b: Secondary | ICD-10-CM | POA: Diagnosis not present

## 2022-05-06 DIAGNOSIS — Z7901 Long term (current) use of anticoagulants: Secondary | ICD-10-CM | POA: Diagnosis not present

## 2022-05-06 DIAGNOSIS — K922 Gastrointestinal hemorrhage, unspecified: Secondary | ICD-10-CM | POA: Diagnosis not present

## 2022-05-06 DIAGNOSIS — N179 Acute kidney failure, unspecified: Secondary | ICD-10-CM | POA: Diagnosis not present

## 2022-05-06 DIAGNOSIS — I5032 Chronic diastolic (congestive) heart failure: Secondary | ICD-10-CM | POA: Diagnosis not present

## 2022-05-06 LAB — COMPREHENSIVE METABOLIC PANEL
ALT: 16 U/L (ref 0–44)
AST: 19 U/L (ref 15–41)
Albumin: 3.4 g/dL — ABNORMAL LOW (ref 3.5–5.0)
Alkaline Phosphatase: 52 U/L (ref 38–126)
Anion gap: 7 (ref 5–15)
BUN: 19 mg/dL (ref 8–23)
CO2: 25 mmol/L (ref 22–32)
Calcium: 8.7 mg/dL — ABNORMAL LOW (ref 8.9–10.3)
Chloride: 108 mmol/L (ref 98–111)
Creatinine, Ser: 1.08 mg/dL (ref 0.61–1.24)
GFR, Estimated: >60 mL/min (ref >60)
Glucose, Bld: 178 mg/dL — ABNORMAL HIGH (ref 70–99)
Potassium: 3.9 mmol/L (ref 3.5–5.1)
Sodium: 140 mmol/L (ref 135–145)
Total Bilirubin: 0.8 mg/dL (ref 0.3–1.2)
Total Protein: 6.4 g/dL — ABNORMAL LOW (ref 6.5–8.1)

## 2022-05-06 LAB — CBC WITH DIFFERENTIAL/PLATELET
Abs Immature Granulocytes: 0.03 10*3/uL (ref 0.00–0.07)
Basophils Absolute: 0.1 10*3/uL (ref 0.0–0.1)
Basophils Relative: 1 %
Eosinophils Absolute: 0.4 10*3/uL (ref 0.0–0.5)
Eosinophils Relative: 4 %
HCT: 32.2 % — ABNORMAL LOW (ref 39.0–52.0)
Hemoglobin: 9.4 g/dL — ABNORMAL LOW (ref 13.0–17.0)
Immature Granulocytes: 0 %
Lymphocytes Relative: 17 %
Lymphs Abs: 1.5 10*3/uL (ref 0.7–4.0)
MCH: 22.2 pg — ABNORMAL LOW (ref 26.0–34.0)
MCHC: 29.2 g/dL — ABNORMAL LOW (ref 30.0–36.0)
MCV: 75.9 fL — ABNORMAL LOW (ref 80.0–100.0)
Monocytes Absolute: 0.7 10*3/uL (ref 0.1–1.0)
Monocytes Relative: 8 %
Neutro Abs: 6.4 10*3/uL (ref 1.7–7.7)
Neutrophils Relative %: 70 %
Platelets: 239 10*3/uL (ref 150–400)
RBC: 4.24 MIL/uL (ref 4.22–5.81)
RDW: 19 % — ABNORMAL HIGH (ref 11.5–15.5)
WBC: 9.2 10*3/uL (ref 4.0–10.5)
nRBC: 0 % (ref 0.0–0.2)

## 2022-05-06 LAB — GLUCOSE, CAPILLARY
Glucose-Capillary: 173 mg/dL — ABNORMAL HIGH (ref 70–99)
Glucose-Capillary: 242 mg/dL — ABNORMAL HIGH (ref 70–99)

## 2022-05-06 LAB — MAGNESIUM: Magnesium: 2.1 mg/dL (ref 1.7–2.4)

## 2022-05-06 LAB — PHOSPHORUS: Phosphorus: 3.9 mg/dL (ref 2.5–4.6)

## 2022-05-06 LAB — SURGICAL PATHOLOGY

## 2022-05-06 MED ORDER — APIXABAN 5 MG PO TABS: 5.0000 mg | ORAL_TABLET | Freq: Two times a day (BID) | ORAL | 2 refills | Status: AC

## 2022-05-06 MED ORDER — POLYSACCHARIDE IRON COMPLEX 150 MG PO CAPS: 150.0000 mg | ORAL_CAPSULE | Freq: Every day | ORAL | 0 refills | Status: DC

## 2022-05-06 MED ORDER — METOPROLOL TARTRATE 50 MG PO TABS: 50.0000 mg | ORAL_TABLET | Freq: Two times a day (BID) | ORAL | 0 refills | Status: DC

## 2022-05-06 MED ORDER — POLYSACCHARIDE IRON COMPLEX 150 MG PO CAPS
150.0000 mg | ORAL_CAPSULE | Freq: Every day | ORAL | Status: DC
  Administered 2022-05-06: 150 mg via ORAL
  Filled 2022-05-06: qty 1

## 2022-05-06 MED ORDER — ONDANSETRON HCL 4 MG PO TABS: 4.0000 mg | ORAL_TABLET | Freq: Four times a day (QID) | ORAL | 0 refills | Status: DC | PRN

## 2022-05-06 NOTE — Progress Notes (Signed)
Progress Note  Patient Name: [name] Date of Encounter: 05/06/2022  Primary Cardiologist: Peter Martinique, MD   Subjective   Patient seen and examined at his bedside.  Offers no complaints at this time.  He is looking forward to going home.  Inpatient Medications    Scheduled Meds:  atorvastatin  40 mg Oral QHS   cholecalciferol  2,000 Units Oral Daily   dutasteride  0.5 mg Oral QODAY   gabapentin  300 mg Oral TID   insulin aspart  0-15 Units Subcutaneous TID WC   insulin aspart  0-5 Units Subcutaneous QHS   insulin glargine-yfgn  25 Units Subcutaneous Daily   iron polysaccharides  150 mg Oral Daily   metoprolol tartrate  50 mg Oral BID   pantoprazole  40 mg Oral Q1500   tamsulosin  0.4 mg Oral QODAY   Continuous Infusions:  PRN Meds: acetaminophen **OR** acetaminophen, HYDROmorphone (DILAUDID) injection, ondansetron **OR** ondansetron (ZOFRAN) IV, oxyCODONE **OR** oxyCODONE, promethazine   Vital Signs    Vitals:   05/05/22 1420 05/05/22 1448 05/05/22 2140 05/06/22 0454  BP: 119/63 123/71 127/75 138/80  Pulse: (!) 102 93 99 (!) 51  Resp: '15 20 18   '$ Temp:  97.6 F (36.4 C) 97.8 F (36.6 C) 98.1 F (36.7 C)  TempSrc:  Axillary Oral Oral  SpO2: 94% 97% 96% 96%  Weight:      Height:        Intake/Output Summary (Last 24 hours) at 05/06/2022 1147 Last data filed at 05/06/2022 0900 Gross per 24 hour  Intake 120 ml  Output --  Net 120 ml   Filed Weights   05/01/22 2305  Weight: 93 kg    Telemetry    Atrial fibrillation rate control- Personally Reviewed  ECG    Today- Personally Reviewed  Physical Exam    General: Comfortable, sitting up in a chair Head: Atraumatic, normal size  Eyes: PEERLA, EOMI  Neck: Supple, normal JVD Cardiac: Normal S1, S2; RRR; no murmurs, rubs, or gallops Lungs: Clear to auscultation bilaterally Abd: Soft, nontender, no hepatomegaly  Ext: warm, no edema Musculoskeletal: No deformities, BUE and BLE strength normal and  equal Skin: Warm and dry, no rashes   Neuro: Alert and oriented to person, place, time, and situation, CNII-XII grossly intact, no focal deficits  Psych: Normal mood and affect   Labs    Chemistry Recent Labs  Lab 05/01/22 2340 05/03/22 0332 05/04/22 0424 05/05/22 0433 05/06/22 0430  NA 139 141 141 141 140  K 4.0 3.9 4.4 3.5 3.9  CL 101 105 107 112* 108  CO2 '27 26 27 24 25  '$ GLUCOSE 219* 91 102* 78 178*  BUN 25* '22 19 16 19  '$ CREATININE 1.61* 1.16 1.18 1.08 1.08  CALCIUM 8.9 9.0 9.2 8.6* 8.7*  PROT 6.9  --   --   --  6.4*  ALBUMIN 3.5 3.9  --   --  3.4*  AST 16  --   --   --  19  ALT 12  --   --   --  16  ALKPHOS 51  --   --   --  52  BILITOT 0.8  --   --   --  0.8  GFRNONAA 40* 59* 58* >60 >60  ANIONGAP '11 10 7 5 7     '$ Hematology Recent Labs  Lab 05/04/22 0424 05/05/22 0433 05/06/22 0430  WBC 7.6 7.9 9.2  RBC 4.32 4.28 4.24  HGB 9.6* 9.4* 9.4*  HCT 32.8* 32.1* 32.2*  MCV 75.9* 75.0* 75.9*  MCH 22.2* 22.0* 22.2*  MCHC 29.3* 29.3* 29.2*  RDW 18.9* 18.9* 19.0*  PLT 238 236 239    Cardiac EnzymesNo results for input(s): "TROPONINI" in the last 168 hours. No results for input(s): "TROPIPOC" in the last 168 hours.   BNP Recent Labs  Lab 05/01/22 2340  BNP 317.3*     DDimer No results for input(s): "DDIMER" in the last 168 hours.   Radiology    No results found.  Cardiac Studies   TTE 05/02/2022 IMPRESSIONS   1. Left ventricular ejection fraction, by estimation, is 60 to 65%. The  left ventricle has normal function. The left ventricle has no regional  wall motion abnormalities. There is mild left ventricular hypertrophy.  Left ventricular diastolic parameters  are indeterminate.   2. Right ventricular systolic function was not well visualized. The right  ventricular size is not well visualized. RV is poorly visualized but  function appears moderately reduced   3. The mitral valve is normal in structure. No evidence of mitral valve  regurgitation. No  evidence of mitral stenosis.   4. Left atrial size was severely dilated.   5. Aortic dilatation noted. There is dilatation of the ascending aorta,  measuring 40 mm.   6. The aortic valve is tricuspid. There is severe calcification of the  aortic valve. Aortic valve regurgitation is mild. Moderate to severe  aortic valve stenosis. Moderate AS by gradients (MG 24 mmHg, Vmax 3.0  m/s), severe by AVA (0.7cm^2) and DI  (0.24). Low SV index (28 cc/m^2), suspect paradoxical low flow low  gradient severe AS   FINDINGS   Left Ventricle: Left ventricular ejection fraction, by estimation, is 60  to 65%. The left ventricle has normal function. The left ventricle has no  regional wall motion abnormalities. Definity contrast agent was given IV  to delineate the left ventricular   endocardial borders. The left ventricular internal cavity size was normal  in size. There is mild left ventricular hypertrophy. Left ventricular  diastolic parameters are indeterminate.   Right Ventricle: The right ventricular size is not well visualized. Right  vetricular wall thickness was not well visualized. Right ventricular  systolic function was not well visualized.   Left Atrium: Left atrial size was severely dilated.   Right Atrium: Right atrial size was normal in size.   Pericardium: There is no evidence of pericardial effusion.   Mitral Valve: The mitral valve is normal in structure. No evidence of  mitral valve regurgitation. No evidence of mitral valve stenosis. MV peak  gradient, 4.8 mmHg. The mean mitral valve gradient is 2.0 mmHg.   Tricuspid Valve: The tricuspid valve is normal in structure. Tricuspid  valve regurgitation is trivial.   Aortic Valve: The aortic valve is tricuspid. There is severe calcifcation  of the aortic valve. Aortic valve regurgitation is mild. Severe aortic  stenosis is present. Aortic valve mean gradient measures 18.7 mmHg. Aortic  valve peak gradient measures 26.4  mmHg.    Pulmonic Valve: The pulmonic valve was not well visualized. Pulmonic valve  regurgitation is trivial.   Aorta: The aortic root is normal in size and structure and aortic  dilatation noted. There is dilatation of the ascending aorta, measuring 40  mm.   IAS/Shunts: The interatrial septum was not well visualized.     Patient Profile     89 y.o. male with history of CAD status post CABG in 1990 with subsequent DES to the  RCA in 2006, for atrial fibrillation, cardiovascular heart failure, moderate to severe aortic stenosis, CVA, hypertension, coronary artery disease and hyperlipidemia.   Assessment & Plan    Permanent atrial fibrillation Recent presyncope episode noted to be multifactorial GI bleeding, dehydration and hypotension in the setting of AV nodal blockers  CAD GI bleed AKI Chronic diastolic heart failure Aortic stenosis  He is status post his endoscopy yesterday there are no reports of any significant bleeding.  Noted moderate hiatal hernia. Please follow GI recs for resuming his anticoagulation which is Sunday. From a CV standpoint he can be discharged with strict instructions on when to restart his Eliquis.   For questions or updates, please contact [place] Please consult [email or web address] for contact info under Cardiology/STEMI.      Signed, Berniece Salines, DO  05/06/2022, 11:47 AM

## 2022-05-06 NOTE — Progress Notes (Signed)
AVS given to patient and explained at the bedside. Medications and follow up appointments have been explained with pt verbalizing understanding.  

## 2022-05-06 NOTE — Discharge Summary (Signed)
Physician Discharge Summary   Patient: [name] MRN: [id number] DOB: 30-Sep-1928  Admit date:     05/01/2022  Discharge date: 05/06/22  Discharge Physician: Raiford Noble, DO   PCP: [name], MD   Recommendations at discharge:   Follow up with PCP within 1-2 weeks, and repeat CBC, CMP, Mag, Phos within 1 week Follow up with Cardiology within 1-2 weeks Follow up with GI within 1-2 weeks and resume Anticoagulation on Sunday  Discharge Diagnoses: Principal Problem:   Postural dizziness with presyncope Active Problems:   Bradycardia   Lower GI bleeding   Chronic atrial fibrillation (HCC)   AKI (acute kidney injury) ([place])   Diabetic peripheral neuropathy associated with type 2 diabetes mellitus (HCC)   Chronic diastolic heart failure (HCC)   Stage 3b chronic kidney disease (HCC)   Mild cognitive impairment   Coronary artery disease involving native coronary artery of native heart without angina pectoris   Chronic anticoagulation   Iron deficiency anemia due to chronic blood loss   Heme positive stool   Benign neoplasm of cecum   Hiatal hernia  Resolved Problems:   * No resolved hospital problems. *  Hospital Course: 89 y.o. male with medical history significant of DM2, HTN, A.Fib rate controlled and on eliquis, who is high functioning despite age,independent living, does all ADLs presented to the ED with dizziness / lightheadedness and pre-syncopal symptoms after standing last night. Then developed SOB which persisted despite sitting back down. Has been having milder symptoms of lightheadedness and dizziness on standing recently for past weeks now. Pt with known ongoing GIB and progressive blood loss anemia, hemoccult positive stool.  He has been scheduled for colonoscopy with Dr. Lyndel Safe April 10th. Seen in the ED.EMS = SBP in 90s and HRs in 40-60 range.No N/V. No grossly bloody BMs. Patient admitted for lower GI bleeding/postural dizziness with presyncope    **Further  workup done as below and GI feels that the patient's iron deficiency and Hemoccult positive stool was likely due to chronic blood loss from large polyps.  He was deemed stable and ambulated without issues and gastroenterology recommended resuming anticoagulation on Sunday.  From a cardiac standpoint he is also being medically stable.  Need to follow-up with his PCP, cardiologist and GI doctor in outpatient setting  Assessment and Plan: * Postural dizziness with presyncope, improved -Orthostatic hypotension and presyncope due to either orsening anemia and accelerating blood loss from known GIB and or symptomatic bradycardia in setting of BB and CCB use for A.Fib rate control. -See Below -ECHO as below -PT/OT to further Evaluate and Treat and they are recommending no follow-up   Lower GI Bleeding Ongoing Hemoccult positive history Progressive anemia -Pt with ongoing hemoccult positive stools, mild anemia that has been slowly progressive over past month.  HGB down to 9.x from 10.x 1 month ago. -Pt already has colonoscopy scheduled on 4/10 it looks like with Dr. Lyndel Safe. -GI following closely appreciate input hemoglobin remains stable, plan for EGD and colonoscopy 3/6 after Eliquis washout x 3 days. -Continue PPI, diet as per GI. -Anemia Panel checked -Hgb/Hct Trend: Recent Labs  Lab 05/01/22 2340 05/02/22 1147 05/03/22 0332 05/04/22 0424 05/05/22 0433 05/06/22 0430  HGB 9.4* 9.2* 9.9* 9.6* 9.4* 9.4*  HCT 33.0* 31.4* 34.4* 32.8* 32.1* 32.2*  MCV 76.2*  --  75.4* 75.9* 75.0* 75.9*  -Continue to Monitor for S/Sx of Bleeding -EGD done today and showed a normal esophagus, normal stomach, moderate hiatal hernia, no Cameron lesions, normal examined  duodenum and no specimens collected -Colonoscopy done and showed seven 4 to 15 mm polyps in the rectum and in the sigmoid colon and the descending colon as well as in the transverse colon and the ascending colon and the cecum were all removed with a  cold snare.  Is already resected retrieved and there is diverticulosis in the sigmoid colon.  Otherwise examination was normal on direct and retroflexion views.  The GI physician feels that the iron deficiency anemia and Hemoccult positive stool likely due to chronic blood loss from large polyps -He had no further bleeding and he is stable for discharge   Bradycardia, improved  -Pt now with bradycardia, suspected to be symptomatic bradycardia with SBPs in the 90s in ER this morning. -EDP discussed with cards and had held cardizem and metoprolol -Cardiology has now resumed BB as below and will continue at discharge and hold his Cardizem and long-acting metoprolol   AKI (Acute Kidney Injury) ([place]) AKI on CKD 3 with creat 1.6 today up from 1.2-1.3 Baseline. -BUN/Cr Trend: Recent Labs  Lab 05/01/22 2340 05/03/22 0332 05/04/22 0424 05/05/22 0433 05/06/22 0430  BUN 25* '22 19 16 19  '$ CREATININE 1.61* 1.16 1.18 1.08 1.08  -Avoid Nephrotoxic Medications, Contrast Dyes, Hypotension and Dehydration to Ensure Adequate Renal Perfusion and will need to Renally Adjust Meds -Continue to Monitor and Trend Renal Function carefully and repeat CMP in the AM    Permanent/Chronic atrial fibrillation (HCC) -Continue holding Apixaban in setting of GIB. -Was Holding BB and CCB on Admisison due to Bradycardia/Rate Control. -His beta-blocker was initiated and was uptitrated by cardiology due to uncontrolled rates and is now on Metropol Tartrate 50 mg p.o. twice daily -Resume Anticoagulation as soon as okay with GI post EGD    Moderate aortic stenosis Symptomatic bradycardia with SBPs in the 90s in ER:  -Cardiology has been consulted Cardizem Metropol initially held and subsequently resumed 25 mg twice daily metoprolol 3/3 and uptitrated to 50 mg po BId.  Heart rate  now ranging from 79-110  -Appreciate cardiology input and monitoring  -TTE 3/3 done-EF 60-65%, no RWMA, left ventricular diastolic valemetostat  indeterminate dilation of ascending aorta up to 40 mm moderate to severe aortic valve stenosis moderate AS by gradient -Further Plan per Cardiology   Mild cognitive impairment -Very mild.  Seems quite sharp and oriented on evaluation.   Chronic diastolic heart failure (HCC) -Was Holding home lasix in setting of AKI -BNP 317 which is about his baseline today. -No pulm edema on CXR  -Strict I's and O's and Daily Weights -Continue to Monitor for S/Sx of Volume Overload -Repeat CXR in the outpatient setting    Diabetic peripheral neuropathy associated with type 2 diabetes mellitus ([place]) -Hold home Jardiance and Metformin -Held lantus at reduced dose of 25u today (he takes 40-42 QAM at home) given NPO Status -C/w Moderate Novolog SSI AC/HS -CBGs ranging from 0000000   Obesity -Complicates overall prognosis and care -Estimated body mass index is 33.09 kg/m as calculated from the following:   Height as of this encounter: '5\' 6"'$  (1.676 m).   Weight as of this encounter: 93 kg.  -Weight Loss and Dietary Counseling given -Will benefit with PCP follow-up, weight loss healthy lifestyle and outpatient sleep evaluation.   Consultants: Gastroenterology, Cardiology Procedures performed: EGD and ECHO  Disposition: Home Diet recommendation:  Discharge Diet Orders (From admission, onward)     Start     Ordered   05/06/22 0000  Diet - low sodium  heart healthy        05/06/22 1111   05/06/22 0000  Diet Carb Modified        05/06/22 1111           Cardiac and Carb modified diet DISCHARGE MEDICATION: Allergies as of 05/06/2022       Reactions   Septra [sulfamethoxazole-trimethoprim] Itching   Cefadroxil Other (See Comments)   Unknown reaction    Ciprofloxacin Other (See Comments)   Unknown action    Erythromycin Other (See Comments)   Upsets the stomach        Medication List     STOP taking these medications    diltiazem 360 MG 24 hr capsule Commonly known as: TIAZAC    metoprolol succinate 100 MG 24 hr tablet Commonly known as: TOPROL-XL       TAKE these medications    apixaban 5 MG Tabs tablet Commonly known as: ELIQUIS Take 1 tablet (5 mg total) by mouth 2 (two) times daily. Start taking on: May 09, 2022 What changed: These instructions start on May 09, 2022. If you are unsure what to do until then, ask your doctor or other care provider.   atorvastatin 40 MG tablet Commonly known as: LIPITOR Take 1 tablet (40 mg total) by mouth daily at 6 PM. What changed: when to take this   Avodart 0.5 MG capsule Generic drug: dutasteride Take 0.5 mg by mouth every other day.   empagliflozin 25 MG Tabs tablet Commonly known as: JARDIANCE Take 1 tablet (25 mg total) by mouth daily before breakfast.   furosemide 40 MG tablet Commonly known as: LASIX Take 1 tablet (40 mg total) by mouth daily.   gabapentin 300 MG capsule Commonly known as: NEURONTIN Take 2 capsules (600 mg total) by mouth 3 (three) times daily.   insulin glargine 100 UNIT/ML injection Commonly known as: LANTUS Inject 40 Units into the skin every morning.   iron polysaccharides 150 MG capsule Commonly known as: NIFEREX Take 1 capsule (150 mg total) by mouth daily.   metFORMIN 500 MG tablet Commonly known as: GLUCOPHAGE Take 500 mg by mouth 2 (two) times daily with a meal.   metoprolol tartrate 50 MG tablet Commonly known as: LOPRESSOR Take 1 tablet (50 mg total) by mouth 2 (two) times daily.   ondansetron 4 MG tablet Commonly known as: ZOFRAN Take 1 tablet (4 mg total) by mouth every 6 (six) hours as needed for nausea.   pantoprazole 40 MG tablet Commonly known as: PROTONIX TAKE 1 TABLET BY MOUTH DAILY AT NOON What changed: See the new instructions.   tamsulosin 0.4 MG Caps capsule Commonly known as: FLOMAX Take 0.4 mg by mouth every other day.   traMADol 50 MG tablet Commonly known as: ULTRAM Take 1 tablet (50 mg total) by mouth daily as needed (hand  pain). What changed: reasons to take this   TYLENOL 500 MG tablet Generic drug: acetaminophen Take 500-1,000 mg by mouth every 6 (six) hours as needed for mild pain or headache.   VITAMIN D3 PO Take 50 mcg by mouth daily.        Discharge Exam: Filed Weights   05/01/22 2305  Weight: 93 kg   Vitals:   05/05/22 2140 05/06/22 0454  BP: 127/75 138/80  Pulse: 99 (!) 51  Resp: 18   Temp: 97.8 F (36.6 C) 98.1 F (36.7 C)  SpO2: 96% 96%   Examination: Physical Exam:  Constitutional: WN/WD obese Caucasian male in no acute distress Respiratory:  Diminished to auscultation bilaterally, no wheezing, rales, rhonchi or crackles. Normal respiratory effort and patient is not tachypenic. No accessory muscle use.  Unlabored breathing Cardiovascular: RRR, no murmurs / rubs / gallops. S1 and S2 auscultated. No extremity edema.  Abdomen: Soft, non-tender, distended secondary body habitus. Bowel sounds positive.  GU: Deferred. Musculoskeletal: No clubbing / cyanosis of digits/nails. No joint deformity upper and lower extremities.  Skin: No rashes, lesions, ulcers on the skin evaluation. No induration; Warm and dry.  Neurologic: CN 2-12 grossly intact with no focal deficits. Romberg sign and cerebellar reflexes not assessed.  Psychiatric: Normal judgment and insight. Alert and oriented x 3. Normal mood and appropriate affect.   Condition at discharge: stable  The results of significant diagnostics from this hospitalization (including imaging, microbiology, ancillary and laboratory) are listed below for reference.   Imaging Studies: ECHOCARDIOGRAM COMPLETE  Result Date: 05/02/2022    ECHOCARDIOGRAM REPORT   Patient Name:   [name] Date of Exam: 05/02/2022 Medical Rec #:  [id number]       Height:       66.0 in Accession #:    [id number]      Weight:       205.0 lb Date of Birth:  03/09/1928       BSA:          2.021 m Patient Age:    32 years        BP:           139/59 mmHg Patient  Gender: M               HR:           77 bpm. Exam Location:  Inpatient Procedure: 2D Echo, Cardiac Doppler, Color Doppler and Intracardiac            Opacification Agent Indications:    CHF  History:        Patient has prior history of Echocardiogram examinations. CHF,                 CAD, Prior CABG, Stroke and Carotid Disease, Aortic Valve                 Disease, Arrythmias:Atrial Fibrillation and Bradycardia; Risk                 Factors:Hypertension, Diabetes and Dyslipidemia.  Sonographer:    Eartha Inch Referring Phys: [id number] [place]  Sonographer Comments: Technically difficult study due to poor echo windows. Image acquisition challenging due to patient body habitus and Image acquisition challenging due to respiratory motion. IMPRESSIONS  1. Left ventricular ejection fraction, by estimation, is 60 to 65%. The left ventricle has normal function. The left ventricle has no regional wall motion abnormalities. There is mild left ventricular hypertrophy. Left ventricular diastolic parameters are indeterminate.  2. Right ventricular systolic function was not well visualized. The right ventricular size is not well visualized. RV is poorly visualized but function appears moderately reduced  3. The mitral valve is normal in structure. No evidence of mitral valve regurgitation. No evidence of mitral stenosis.  4. Left atrial size was severely dilated.  5. Aortic dilatation noted. There is dilatation of the ascending aorta, measuring 40 mm.  6. The aortic valve is tricuspid. There is severe calcification of the aortic valve. Aortic valve regurgitation is mild. Moderate to severe aortic valve stenosis. Moderate AS by gradients (MG 24 mmHg, Vmax 3.0 m/s), severe by AVA (0.7cm^2) and DI (0.24). Low SV  index (28 cc/m^2), suspect paradoxical low flow low gradient severe AS FINDINGS  Left Ventricle: Left ventricular ejection fraction, by estimation, is 60 to 65%. The left ventricle has normal function. The  left ventricle has no regional wall motion abnormalities. Definity contrast agent was given IV to delineate the left ventricular  endocardial borders. The left ventricular internal cavity size was normal in size. There is mild left ventricular hypertrophy. Left ventricular diastolic parameters are indeterminate. Right Ventricle: The right ventricular size is not well visualized. Right vetricular wall thickness was not well visualized. Right ventricular systolic function was not well visualized. Left Atrium: Left atrial size was severely dilated. Right Atrium: Right atrial size was normal in size. Pericardium: There is no evidence of pericardial effusion. Mitral Valve: The mitral valve is normal in structure. No evidence of mitral valve regurgitation. No evidence of mitral valve stenosis. MV peak gradient, 4.8 mmHg. The mean mitral valve gradient is 2.0 mmHg. Tricuspid Valve: The tricuspid valve is normal in structure. Tricuspid valve regurgitation is trivial. Aortic Valve: The aortic valve is tricuspid. There is severe calcifcation of the aortic valve. Aortic valve regurgitation is mild. Severe aortic stenosis is present. Aortic valve mean gradient measures 18.7 mmHg. Aortic valve peak gradient measures 26.4 mmHg. Pulmonic Valve: The pulmonic valve was not well visualized. Pulmonic valve regurgitation is trivial. Aorta: The aortic root is normal in size and structure and aortic dilatation noted. There is dilatation of the ascending aorta, measuring 40 mm. IAS/Shunts: The interatrial septum was not well visualized.  LEFT VENTRICLE PLAX 2D LVIDd:         4.50 cm   Diastology LVIDs:         3.70 cm   LV e' medial:    6.30 cm/s LV PW:         1.10 cm   LV E/e' medial:  17.7 LV IVS:        1.10 cm   LV e' lateral:   6.56 cm/s LVOT diam:     2.00 cm   LV E/e' lateral: 17.0 LVOT Area:     3.14 cm  RIGHT VENTRICLE RV S prime:     5.08 cm/s TAPSE (M-mode): 0.9 cm LEFT ATRIUM            Index        RIGHT ATRIUM            Index LA diam:      4.60 cm  2.28 cm/m   RA Area:     17.10 cm LA Vol (A4C): 101.0 ml 49.97 ml/m  RA Volume:   42.70 ml  21.13 ml/m  AORTIC VALVE AV Vmax:           257.00 cm/s AV Vmean:          210.333 cm/s AV VTI:            0.732 m AV Peak Grad:      26.4 mmHg AV Mean Grad:      18.7 mmHg AR Vena Contracta: 0.30 cm  AORTA Ao Root diam: 3.70 cm Ao Asc diam:  4.00 cm MITRAL VALVE                TRICUSPID VALVE MV Area (PHT): 3.75 cm     TR Peak grad:   15.2 mmHg MV Peak grad:  4.8 mmHg     TR Vmax:        195.00 cm/s MV Mean grad:  2.0 mmHg MV Vmax:  1.10 m/s     SHUNTS MV Vmean:      57.0 cm/s    Systemic Diam: 2.00 cm MV Decel Time: 203 msec MR Peak grad: 25.7 mmHg MR Mean grad: 17.0 mmHg MR Vmax:      253.50 cm/s MR Vmean:     191.0 cm/s MV E velocity: 111.50 cm/s Oswaldo Milian MD Electronically signed by Oswaldo Milian MD Signature Date/Time: 05/02/2022/2:34:13 PM    Final    DG Chest Port 1 View  Result Date: 05/02/2022 CLINICAL DATA:  Shortness of breath EXAM: PORTABLE CHEST 1 VIEW COMPARISON:  12/05/2021 FINDINGS: Prior CABG. Cardiomegaly. No confluent airspace opacities, effusions or edema. No acute bony abnormality. IMPRESSION: Cardiomegaly.  No active disease. Electronically Signed   By: Rolm Baptise M.D.   On: 05/02/2022 00:36   CT ABDOMEN PELVIS W CONTRAST  Result Date: 04/21/2022 CLINICAL DATA:  Blood in stool.  Anemia. EXAM: CT ABDOMEN AND PELVIS WITH CONTRAST TECHNIQUE: Multidetector CT imaging of the abdomen and pelvis was performed using the standard protocol following bolus administration of intravenous contrast. RADIATION DOSE REDUCTION: This exam was performed according to the departmental dose-optimization program which includes automated exposure control, adjustment of the mA and/or kV according to patient size and/or use of iterative reconstruction technique. CONTRAST:  131m OMNIPAQUE IOHEXOL 300 MG/ML  SOLN COMPARISON:  Noncontrast CT on 11/28/2010 FINDINGS:  Lower Chest: No acute findings. Hepatobiliary: No hepatic masses identified. Gallstones are seen, however there is no evidence of cholecystitis or biliary dilatation. Pancreas:  No mass or inflammatory changes. Spleen: Within normal limits in size and appearance. Adrenals/Urinary Tract: No suspicious masses identified. 2 mm calculus noted in upper pole of right kidney. No evidence of ureteral calculi or hydronephrosis. Stomach/Bowel: No evidence of obstruction, inflammatory process or abnormal fluid collections. Normal appendix visualized. Vascular/Lymphatic: No pathologically enlarged lymph nodes. No acute vascular findings. Aortic atherosclerotic calcification incidentally noted. Reproductive: Moderately enlarged prostate gland is seen with mass effect on bladder base. Other:  None. Musculoskeletal: No suspicious bone lesions identified. Right hip prosthesis noted. IMPRESSION: No acute findings. No evidence of bowel obstruction or mass seen by CT. Moderately enlarged prostate. Cholelithiasis. No radiographic evidence of cholecystitis. Tiny right renal calculus. No evidence of ureteral calculi or hydronephrosis. Electronically Signed   By: JMarlaine HindM.D.   On: 04/21/2022 15:21    Microbiology: Results for orders placed or performed during the hospital encounter of 05/01/22  Resp panel by RT-PCR (RSV, Flu A&B, Covid) Anterior Nasal Swab     Status: None   Collection Time: 05/01/22 11:53 PM   Specimen: Anterior Nasal Swab  Result Value Ref Range Status   SARS Coronavirus 2 by RT PCR NEGATIVE NEGATIVE Final    Comment: (NOTE) SARS-CoV-2 target nucleic acids are NOT DETECTED.  The SARS-CoV-2 RNA is generally detectable in upper respiratory specimens during the acute phase of infection. The lowest concentration of SARS-CoV-2 viral copies this assay can detect is 138 copies/mL. A negative result does not preclude SARS-Cov-2 infection and should not be used as the sole basis for treatment or other  patient management decisions. A negative result may occur with  improper specimen collection/handling, submission of specimen other than nasopharyngeal swab, presence of viral mutation(s) within the areas targeted by this assay, and inadequate number of viral copies(<138 copies/mL). A negative result must be combined with clinical observations, patient history, and epidemiological information. The expected result is Negative.  Fact Sheet for Patients:  hEntrepreneurPulse.com.au Fact Sheet for Healthcare Providers:  IncredibleEmployment.be  This test is no t yet approved or cleared by the [place] and  has been authorized for detection and/or diagnosis of SARS-CoV-2 by FDA under an Emergency Use Authorization (EUA). This EUA will remain  in effect (meaning this test can be used) for the duration of the COVID-19 declaration under Section 564(b)(1) of the Act, 21 U.S.C.section 360bbb-3(b)(1), unless the authorization is terminated  or revoked sooner.       Influenza A by PCR NEGATIVE NEGATIVE Final   Influenza B by PCR NEGATIVE NEGATIVE Final    Comment: (NOTE) The Xpert Xpress SARS-CoV-2/FLU/RSV plus assay is intended as an aid in the diagnosis of influenza from Nasopharyngeal swab specimens and should not be used as a sole basis for treatment. Nasal washings and aspirates are unacceptable for Xpert Xpress SARS-CoV-2/FLU/RSV testing.  Fact Sheet for Patients: EntrepreneurPulse.com.au  Fact Sheet for Healthcare Providers: IncredibleEmployment.be  This test is not yet approved or cleared by the [place] FDA and has been authorized for detection and/or diagnosis of SARS-CoV-2 by FDA under an Emergency Use Authorization (EUA). This EUA will remain in effect (meaning this test can be used) for the duration of the COVID-19 declaration under Section 564(b)(1) of the Act, 21 U.S.C. section  360bbb-3(b)(1), unless the authorization is terminated or revoked.     Resp Syncytial Virus by PCR NEGATIVE NEGATIVE Final    Comment: (NOTE) Fact Sheet for Patients: EntrepreneurPulse.com.au  Fact Sheet for Healthcare Providers: IncredibleEmployment.be  This test is not yet approved or cleared by the [place] FDA and has been authorized for detection and/or diagnosis of SARS-CoV-2 by FDA under an Emergency Use Authorization (EUA). This EUA will remain in effect (meaning this test can be used) for the duration of the COVID-19 declaration under Section 564(b)(1) of the Act, 21 U.S.C. section 360bbb-3(b)(1), unless the authorization is terminated or revoked.  Performed at [facility], [place] [address] [place], [place] [address]    Labs: CBC: Recent Labs  Lab 05/01/22 2340 05/02/22 1147 05/03/22 0332 05/04/22 0424 05/05/22 0433 05/06/22 0430  WBC 9.4  --  9.3 7.6 7.9 9.2  NEUTROABS 6.7  --  6.1  --   --  6.4  HGB 9.4* 9.2* 9.9* 9.6* 9.4* 9.4*  HCT 33.0* 31.4* 34.4* 32.8* 32.1* 32.2*  MCV 76.2*  --  75.4* 75.9* 75.0* 75.9*  PLT 242  --  264 238 236 [id number]   Basic Metabolic Panel: Recent Labs  Lab 05/01/22 2340 05/03/22 0332 05/04/22 0424 05/05/22 0433 05/06/22 0430  NA 139 141 141 141 140  K 4.0 3.9 4.4 3.5 3.9  CL 101 105 107 112* 108  CO2 '27 26 27 24 25  '$ GLUCOSE 219* 91 102* 78 178*  BUN 25* '22 19 16 19  '$ CREATININE 1.61* 1.16 1.18 1.08 1.08  CALCIUM 8.9 9.0 9.2 8.6* 8.7*  MG  --  2.1  --   --  2.1  PHOS  --  4.5  --   --  3.9   Liver Function Tests: Recent Labs  Lab 05/01/22 2340 05/03/22 0332 05/06/22 0430  AST 16  --  19  ALT 12  --  16  ALKPHOS 51  --  52  BILITOT 0.8  --  0.8  PROT 6.9  --  6.4*  ALBUMIN 3.5 3.9 3.4*   CBG: Recent Labs  Lab 05/05/22 0743 05/05/22 1701 05/05/22 2137 05/06/22 0732 05/06/22 1203  GLUCAP 71 144* 209* 173* 242*   Discharge time  spent: greater than  30 minutes.  Signed: Raiford Noble, DO [facility] 05/06/2022

## 2022-05-06 NOTE — Evaluation (Signed)
Physical Therapy One Time Evaluation Patient Details Name: [name] MRN: [id number] DOB: 1928-04-22 Today's Date: 05/06/2022  History of Present Illness  89 y.o. male with medical history significant of DM2, HTN, A.Fib rate controlled and on eliquis, who is high functioning despite age,independent living, does all ADLs presented to the ED with dizziness / lightheadedness and pre-syncopal symptoms.  Pt admitted for Postural dizziness with presyncope and lower GI bleeding.  Clinical Impression  Patient evaluated by Physical Therapy with no further acute PT needs identified. All education has been completed and the patient has no further questions.  See below for any follow-up Physical Therapy or equipment needs. PT is signing off. Thank you for this referral.        Recommendations for follow up therapy are one component of a multi-disciplinary discharge planning process, led by the attending physician.  Recommendations may be updated based on patient status, additional functional criteria and insurance authorization.  Follow Up Recommendations No PT follow up      Assistance Recommended at Discharge    Patient can return home with the following       Equipment Recommendations None recommended by PT  Recommendations for Other Services       Functional Status Assessment Patient has not had a recent decline in their functional status     Precautions / Restrictions Precautions Precautions: None      Mobility  Bed Mobility Overal bed mobility: Independent                  Transfers Overall transfer level: Modified independent Equipment used: Rolling walker (2 wheels)                    Ambulation/Gait Ambulation/Gait assistance: Modified independent (Device/Increase time) Gait Distance (Feet): 400 Feet Assistive device: Rolling walker (2 wheels) Gait Pattern/deviations: WFL(Within Functional Limits)       General Gait Details: denies symptoms, HR  elevated to 135bpm in afib per telemetry (RN aware)  (upper 90s at rest)  Stairs            Wheelchair Mobility    Modified Rankin (Stroke Patients Only)       Balance Overall balance assessment: Needs assistance         Standing balance support: No upper extremity supported Standing balance-Leahy Scale: Fair Standing balance comment: static fair                             Pertinent Vitals/Pain Pain Assessment Pain Assessment: No/denies pain    Home Living Family/patient expects to be discharged to:: Private residence Living Arrangements: Spouse/significant other Available Help at Discharge: Family;Available 24 hours/day Type of Home: House Home Access: Level entry       Home Layout: One level Home Equipment: Conservation officer, nature (2 wheels);Cane - single point      Prior Function Prior Level of Function : Independent/Modified Independent                     Hand Dominance        Extremity/Trunk Assessment        Lower Extremity Assessment Lower Extremity Assessment: Overall WFL for tasks assessed       Communication   Communication: HOH  Cognition Arousal/Alertness: Awake/alert Behavior During Therapy: WFL for tasks assessed/performed Overall Cognitive Status: Within Functional Limits for tasks assessed  General Comments      Exercises     Assessment/Plan    PT Assessment Patient does not need any further PT services  PT Problem List         PT Treatment Interventions      PT Goals (Current goals can be found in the Care Plan section)  Acute Rehab PT Goals PT Goal Formulation: All assessment and education complete, DC therapy    Frequency       Co-evaluation               AM-PAC PT "6 Clicks" Mobility  Outcome Measure Help needed turning from your back to your side while in a flat bed without using bedrails?: None Help needed moving from lying  on your back to sitting on the side of a flat bed without using bedrails?: None Help needed moving to and from a bed to a chair (including a wheelchair)?: None Help needed standing up from a chair using your arms (e.g., wheelchair or bedside chair)?: None Help needed to walk in hospital room?: None Help needed climbing 3-5 steps with a railing? : A Little 6 Click Score: 23    End of Session   Activity Tolerance: Patient tolerated treatment well Patient left: in chair;with call bell/phone within reach Nurse Communication: Mobility status PT Visit Diagnosis: Difficulty in walking, not elsewhere classified (R26.2)    Time: [id number] PT Time Calculation (min) (ACUTE ONLY): 10 min   Charges:   PT Evaluation $PT Eval Low Complexity: 1 Low        Kati PT, DPT Physical Therapist Acute Rehabilitation Services Preferred contact method: Secure Chat Weekend Pager Only: [phone] Office: [phone]   Kati L Payson 05/06/2022, 1:12 PM

## 2022-05-06 NOTE — Anesthesia Postprocedure Evaluation (Signed)
Anesthesia Post Note  Patient: [name]  Procedure(s) Performed: ESOPHAGOGASTRODUODENOSCOPY (EGD) WITH PROPOFOL COLONOSCOPY WITH PROPOFOL POLYPECTOMY     Patient location during evaluation: PACU Anesthesia Type: MAC Level of consciousness: awake and alert Pain management: pain level controlled Vital Signs Assessment: post-procedure vital signs reviewed and stable Respiratory status: spontaneous breathing, nonlabored ventilation and respiratory function stable Cardiovascular status: blood pressure returned to baseline and stable Postop Assessment: no apparent nausea or vomiting Anesthetic complications: no   No notable events documented.  Last Vitals:  Vitals:   05/05/22 2140 05/06/22 0454  BP: 127/75 138/80  Pulse: 99 (!) 51  Resp: 18   Temp: 36.6 C 36.7 C  SpO2: 96% 96%    Last Pain:  Vitals:   05/06/22 0454  TempSrc: Oral  PainSc:    Pain Goal:                   Lynda Rainwater

## 2022-05-06 NOTE — Progress Notes (Signed)
OT Cancellation Note  Patient Details Name: [name] MRN: [id number] DOB: 03-07-1928   Cancelled Treatment:    Reason Eval/Treat Not Completed: OT screened, no needs identified, will sign off. Per conversation with PT who recently worked with pt, pt is at baseline/mod I with mobility. Appears to be at baseline with ADLs. Will sign off.  Tyrone Schimke, OT Acute Rehabilitation Services Office: [phone]   [name] 05/06/2022, 11:30 AM

## 2022-05-07 ENCOUNTER — Telehealth: Payer: Self-pay | Admitting: Family Medicine

## 2022-05-07 NOTE — Telephone Encounter (Signed)
Pts wife called and would like Dr Jonni Sanger to call her back about pts hospital stay. She wanted to talk to her first before making appt. She did not want to make appt until Dr Jonni Sanger called them back. Please advise.

## 2022-05-07 NOTE — Telephone Encounter (Signed)
Spoke with pt's wife and she will call to get him scheduled for ED F/U

## 2022-05-09 ENCOUNTER — Encounter (HOSPITAL_COMMUNITY): Payer: Self-pay | Admitting: Internal Medicine

## 2022-05-10 ENCOUNTER — Ambulatory Visit: Payer: Medicare Other | Admitting: Pharmacist

## 2022-05-10 ENCOUNTER — Telehealth: Payer: Self-pay | Admitting: Pharmacist

## 2022-05-10 NOTE — Progress Notes (Signed)
Care Management & Coordination Services Pharmacy Note  05/10/2022 Name:  [name] MRN:  [id number] DOB:  Feb 23, 1929  Summary: PharmD FU visit.  Patient requested appt to discuss medication changes post hospital stay.  Reviewed discharge summary.  He had not yet picked up reduced metoprolol dose so advised him to do so.  Counseled on CHF exacerbation warning signs.  He has FU appt Friday wity PCP.  Recommendations/Changes made from today's visit: Pick up rx at [facility] for metoprolol tartrate '50mg'$  bid Monitor weight and SOB, Keep appt friday  Follow up plan: FU 3 months   Subjective: [name] is an 89 y.o. year old male who is a primary patient of [name], MD.  The care coordination team was consulted for assistance with disease management and care coordination needs.    Engaged with patient face to face for follow up visit.  Recent office visits:  04/15/2022 OV (PCP) [name], MD; Rec proceeding with work up. Will notify GI. Counseling and education given on reasons for testing, risks/beneifts. Can also get clearance from cards. Will need to hold eloquis.    03/26/2022 OV (PCP) [name], MD; no medication changes indicated.   Recent consult visits:  04/07/2022 OV Gertie Fey) [name], MD; no medication changes indicated.   Hospital visits:  05/01/2022 ED to Hospital Admission due to Symptomatic bradycardia, Chronic blood loss -Cardiology has now resumed BB as below and will continue at discharge and hold his Cardizem and long-acting metoprolol. -Continue holding Apixaban in setting of GIB. -Was Holding BB and CCB on Admisison due to Bradycardia/Rate Control. -His beta-blocker was initiated and was uptitrated by cardiology due to uncontrolled rates and is now on Metropol Tartrate 50 mg p.o. twice daily -Resume Anticoagulation as soon as okay with GI post EGD     Objective:  Lab Results  Component Value Date   CREATININE 1.08 05/06/2022    BUN 19 05/06/2022   GFR 49.26 (L) 04/07/2022   EGFR 54 (L) 10/30/2021   GFRNONAA >60 05/06/2022   GFRAA 57 (L) 01/17/2020   NA 140 05/06/2022   K 3.9 05/06/2022   CALCIUM 8.7 (L) 05/06/2022   CO2 25 05/06/2022   GLUCOSE 178 (H) 05/06/2022    Lab Results  Component Value Date/Time   HGBA1C 8.7 (H) 03/11/2022 10:54 AM   HGBA1C 7.7 (A) 11/11/2021 10:06 AM   HGBA1C 7.6 (A) 05/11/2021 10:27 AM   HGBA1C 8.1 (H) 05/26/2020 10:11 AM   HGBA1C 8.4 11/21/2019 12:00 AM   HGBA1C 9.0 02/09/2018 12:00 AM   GFR 49.26 (L) 04/07/2022 11:19 AM   GFR 45.00 (L) 03/11/2022 10:54 AM   MICROALBUR <0.7 03/14/2019 08:37 AM   MICROALBUR 3.5 (H) 08/01/2017 09:40 AM    Last diabetic Eye exam:  Lab Results  Component Value Date/Time   HMDIABEYEEXA No Retinopathy 10/11/2018 12:00 AM    Last diabetic Foot exam: No results found for: "HMDIABFOOTEX"   Lab Results  Component Value Date   CHOL 147 03/11/2022   HDL 52.30 03/11/2022   LDLCALC 80 03/11/2022   TRIG 75.0 03/11/2022   CHOLHDL 3 03/11/2022       Latest Ref Rng & Units 05/06/2022    4:30 AM 05/03/2022    3:32 AM 05/01/2022   11:40 PM  Hepatic Function  Total Protein 6.5 - 8.1 g/dL 6.4   6.9   Albumin 3.5 - 5.0 g/dL 3.4  3.9  3.5   AST 15 - 41 U/L 19   16  ALT 0 - 44 U/L 16   12   Alk Phosphatase 38 - 126 U/L 52   51   Total Bilirubin 0.3 - 1.2 mg/dL 0.8   0.8     Lab Results  Component Value Date/Time   TSH 1.81 03/11/2022 10:54 AM   TSH 2.06 11/21/2019 12:00 AM   TSH 2.22 08/13/2019 11:54 AM       Latest Ref Rng & Units 05/06/2022    4:30 AM 05/05/2022    4:33 AM 05/04/2022    4:24 AM  CBC  WBC 4.0 - 10.5 K/uL 9.2  7.9  7.6   Hemoglobin 13.0 - 17.0 g/dL 9.4  9.4  9.6   Hematocrit 39.0 - 52.0 % 32.2  32.1  32.8   Platelets 150 - 400 K/uL 239  236  238     Lab Results  Component Value Date/Time   VD25OH 39.69 11/21/2019 12:00 AM   VITAMINB12 404 05/02/2022 06:40 AM   VITAMINB12 414 10/06/2021 04:25 AM    Clinical ASCVD: Yes   The ASCVD Risk score (Arnett DK, et al., 2019) failed to calculate for the following reasons:   The 2019 ASCVD risk score is only valid for ages 38 to 9   The patient has a prior MI or stroke diagnosis        03/11/2022    9:29 AM 02/26/2022   11:39 AM 12/15/2021   10:46 AM  Depression screen PHQ 2/9  Decreased Interest 0 0 0  Down, Depressed, Hopeless 0 0 0  PHQ - 2 Score 0 0 0     Social History   Tobacco Use  Smoking Status Former   Packs/day: 3.00   Years: 10.00   Total pack years: 30.00   Types: Cigarettes   Quit date: 05/11/1958   Years since quitting: 64.0  Smokeless Tobacco Never   BP Readings from Last 3 Encounters:  05/06/22 138/80  04/15/22 (!) 152/87  04/07/22 118/78   Pulse Readings from Last 3 Encounters:  05/06/22 (!) 51  04/15/22 89  04/07/22 73   Wt Readings from Last 3 Encounters:  05/01/22 205 lb 0.4 oz (93 kg)  04/15/22 205 lb 9.6 oz (93.3 kg)  04/07/22 205 lb (93 kg)   BMI Readings from Last 3 Encounters:  05/01/22 33.09 kg/m  04/15/22 33.18 kg/m  04/07/22 33.09 kg/m    Allergies  Allergen Reactions   Septra [Sulfamethoxazole-Trimethoprim] Itching   Cefadroxil Other (See Comments)    Unknown reaction    Ciprofloxacin Other (See Comments)    Unknown action     Erythromycin Other (See Comments)    Upsets the stomach     Medications Reviewed Today     Reviewed by Irene Shipper, MD (Physician) on 05/05/22 at 1218  Med List Status: Complete   Medication Order Taking? Sig Documenting Provider Last Dose Status Informant  apixaban (ELIQUIS) 5 MG TABS tablet [id number] No Take 1 tablet (5 mg total) by mouth 2 (two) times daily. Reyne Dumas, MD 05/01/2022 Active Spouse/Significant Other           Med Note Broadus John, REGEENA   Sun May 02, 2022 10:57 AM)    atorvastatin (LIPITOR) 40 MG tablet [id number] Yes Take 1 tablet (40 mg total) by mouth daily at 6 PM.  Patient taking differently: Take 40 mg by mouth at bedtime.   Reyne Dumas, MD  04/30/2022 Active Spouse/Significant Other  AVODART 0.5 MG capsule [id number] Yes Take 0.5 mg by mouth every other  day. [provider] 04/29/2022 Active Spouse/Significant Other  Cholecalciferol (VITAMIN D3 PO) [id number] Yes Take 50 mcg by mouth daily. [provider] 04/30/2022 Active Spouse/Significant Other  diltiazem (TIAZAC) 360 MG 24 hr capsule [id number] Yes Take 360 mg by mouth daily. [provider] 04/30/2022 Active Spouse/Significant Other  empagliflozin (JARDIANCE) 25 MG TABS tablet [id number] Yes Take 1 tablet (25 mg total) by mouth daily before breakfast. Martinique, Peter M, MD 04/30/2022 Active Spouse/Significant Other  furosemide (LASIX) 40 MG tablet [id number] Yes Take 1 tablet (40 mg total) by mouth daily. Martinique, Peter M, MD 04/30/2022 Active Spouse/Significant Other  gabapentin (NEURONTIN) 300 MG capsule [id number] Yes Take 2 capsules (600 mg total) by mouth 3 (three) times daily. [name], MD 04/30/2022 Active Spouse/Significant Other  insulin glargine (LANTUS) 100 UNIT/ML injection [id number] Yes Inject 40 Units into the skin every morning. [provider] 04/30/2022 Active Spouse/Significant Other  metFORMIN (GLUCOPHAGE) 500 MG tablet [id number] Yes Take 500 mg by mouth 2 (two) times daily with a meal. [provider] 04/30/2022 Active Spouse/Significant Other  metoprolol succinate (TOPROL-XL) 100 MG 24 hr tablet [id number] Yes Take 1 tablet (100 mg total) by mouth daily. Take with or immediately following a meal.  Patient taking differently: Take 100 mg by mouth in the morning.   Martinique, Peter M, MD 04/30/2022 5 pm Active Spouse/Significant Other  pantoprazole (PROTONIX) 40 MG tablet [id number] Yes TAKE 1 TABLET BY MOUTH DAILY AT NOON  Patient taking differently: Take 40 mg by mouth daily in the afternoon.   Martinique, Peter M, MD 04/30/2022 Active Spouse/Significant Other  tamsulosin (FLOMAX) 0.4 MG CAPS capsule [id number] Yes Take 0.4 mg by mouth every other  day. [provider] 04/30/2022 Active Spouse/Significant Other  traMADol (ULTRAM) 50 MG tablet [id number] Yes Take 1 tablet (50 mg total) by mouth daily as needed (hand pain).  Patient taking differently: Take 50 mg by mouth daily as needed for moderate pain (hand pain).   [name], MD 04/30/2022 Active Spouse/Significant Other  TYLENOL 500 MG tablet [id number] Yes Take 500-1,000 mg by mouth every 6 (six) hours as needed for mild pain or headache. [provider] 04/30/2022 Active Spouse/Significant Other            SDOH:  (Social Determinants of Health) assessments and interventions performed: No Financial Resource Strain: Low Risk  (03/11/2022)   Overall Financial Resource Strain (CARDIA)    Difficulty of Paying Living Expenses: Not hard at all   Food Insecurity: No Food Insecurity (05/02/2022)   Hunger Vital Sign    Worried About Running Out of Food in the Last Year: Never true    Ran Out of Food in the Last Year: Never true    SDOH Interventions    Flowsheet Row Clinical Support from 03/11/2022 in [place] Management from 02/26/2022 in [place] from 10/19/2021 in [place] Patient Outreach Telephone from 10/08/2021 in [place] Management from 01/30/2020 in [place] Interventions Intervention Not Indicated Intervention Not Indicated Intervention Not Indicated Intervention Not Indicated Other (Comment)  Housing Interventions Intervention Not Indicated Intervention Not Indicated Intervention Not Indicated -- --  Transportation Interventions Intervention Not Indicated Intervention Not Indicated Intervention Not Indicated Other (Comment)  [needs transportation for post hospital visit referral to transportation] Other (Comment)   Utilities Interventions Intervention Not Indicated  Intervention Not Indicated -- -- --  Financial Strain Interventions Intervention Not Indicated Intervention Not Indicated -- -- --  Physical Activity Interventions Intervention Not Indicated Patient Refused -- -- --  Stress Interventions Intervention Not Indicated Intervention Not Indicated -- -- --  Social Connections Interventions Intervention Not Indicated Intervention Not Indicated, Patient Refused -- -- --       Medication Assistance: None required.  Patient affirms current coverage meets needs.  Medication Access: Within the past 30 days, how often has patient missed a dose of medication? 0 Is a pillbox or other method used to improve adherence? Yes  Factors that may affect medication adherence? no barriers identified Are meds synced by current pharmacy? No  Are meds delivered by current pharmacy? No  Does patient experience delays in picking up medications due to transportation concerns? No   [facility] Reviewed: Is patient disadvantaged to use [facility]?: Yes  Current Rx insurance plan: [place] Name and location of Current pharmacy:  [facility], [place] - [address] [place] [address] [place] [address] [place] [address] Phone: [phone] Fax: [phone]  [facility] services reviewed with patient today?: Yes  Patient requests to transfer care to [facility]?: No  Reason patient declined to change pharmacies: Disadvantaged due to insurance/mail order  Compliance/Adherence/Medication fill history: Jardiance 25 mg daily - no fill date available Metformin 50 mg - no fill date available Lantus 100 u/mL - no fill date available     Care Gaps: Annual wellness visit in last year? Yes Last eye exam / retinopathy screening: 05/07/2022 Last diabetic foot exam: 08/04/2022   Assessment/Plan     Hypertension (BP goal <140/90) -Controlled, not  assessed -Current treatment: Metoprolol tartrate '50mg'$  bid  -Medications previously tried: none noted  -Current home readings: "WNL" per patient  -Denies hypotensive/hypertensive symptoms -Educated on BP goals and benefits of medications for prevention of heart attack, stroke and kidney damage; Importance of home blood pressure monitoring; Symptoms of hypotension and importance of maintaining adequate hydration; -Counseled to monitor BP at home as able, document, and provide log at future appointments -Recommended to continue current medication  Heart Failure (Goal: manage symptoms and prevent exacerbations) 05/10/22 -Controlled -Last ejection fraction: 60-65%  -HF type: HFpEF (EF > 50%) -NYHA Class: II (slight limitation of activity) -AHA HF Stage: C (Heart disease and symptoms present) -Current treatment: Metoprolol tartrate '50mg'$  bid Appropriate, Effective, Safe, Accessible Furosemide '40mg'$  Appropriate, Effective, Safe, Accessible Jardiance '25mg'$  Appropriate, Effective, Safe, Accessible -Medications previously tried: none noted   -Current home BP/HR readings: controlled post hospital, were on lower end thus changed meds -Current home daily weights: monitoring daily  -Educated on Benefits of medications for managing symptoms and prolonging life Importance of weighing daily; if you gain more than 3 pounds in one day or 5 pounds in one week, contact me! -Recommended to continue current medication Had not yet switched to new beta blocker dose.  Will go pick up today from [place].    Diabetes (A1c goal <8%) 05/10/22 -Controlled -Current medications: Empagliflozin '25mg'$  once daily Appropriate, Effective, Safe, Accessible Metformin 500 mg twice daily with meal  Appropriate, Effective, Safe, Accessible Insulin glargine (Lantus) 40 units injected into skin every morning Appropriate, Effective, Safe, Accessible -Medications previously tried: none noted  -Current home glucose  readings Reports home sugars are 100-120 -Denies hypoglycemic/hyperglycemic symptoms -Educated on A1c and blood sugar goals; -Counseled to check feet daily and get yearly eye  exams -Reports weight loss, he is not eating as much. -Denies any low blood sugars - counseled that as he is eating less he may eventually need less insulin. Please let me know if he is consistently < 90 for his blood sugars.  At that point we could consider decreasing Lantus.       Beverly Milch, PharmD Clinical Pharmacist  [facility] [phone]

## 2022-05-10 NOTE — Progress Notes (Signed)
Care Management & Coordination Services Pharmacy Team  Reason for Encounter: Diabetes  Contacted patient to discuss diabetes disease state. Spoke with patient on 05/10/2022   Current antihyperglycemic regimen:  Jardiance 25 mg daily Metformin 500 mg twice daily   Patient verbally confirms he is taking the above medications as directed.   Patient states he is unsure about a lot of his medications and wants to come in and see Leata Mouse, PharmD today 05/10/2022 to discuss his medications. Appt scheduled 05/10/22 at 11 am.   Chart Updates:  Recent office visits:  04/15/2022 OV (PCP) [name], MD; Rec proceeding with work up. Will notify GI. Counseling and education given on reasons for testing, risks/beneifts. Can also get clearance from cards. Will need to hold eloquis.   03/26/2022 OV (PCP) [name], MD; no medication changes indicated.  Recent consult visits:  04/07/2022 OV Gertie Fey) [name], MD; no medication changes indicated.  Hospital visits:  05/01/2022 ED to Hospital Admission due to Symptomatic bradycardia, Chronic blood loss -Cardiology has now resumed BB as below and will continue at discharge and hold his Cardizem and long-acting metoprolol. -Continue holding Apixaban in setting of GIB. -Was Holding BB and CCB on Admisison due to Bradycardia/Rate Control. -His beta-blocker was initiated and was uptitrated by cardiology due to uncontrolled rates and is now on Metropol Tartrate 50 mg p.o. twice daily -Resume Anticoagulation as soon as okay with GI post EGD    Medications: Outpatient Encounter Medications as of 05/10/2022  Medication Sig   apixaban (ELIQUIS) 5 MG TABS tablet Take 1 tablet (5 mg total) by mouth 2 (two) times daily.   atorvastatin (LIPITOR) 40 MG tablet Take 1 tablet (40 mg total) by mouth daily at 6 PM. (Patient taking differently: Take 40 mg by mouth at bedtime.)   AVODART 0.5 MG capsule Take 0.5 mg by mouth every other day.    Cholecalciferol (VITAMIN D3 PO) Take 50 mcg by mouth daily.   empagliflozin (JARDIANCE) 25 MG TABS tablet Take 1 tablet (25 mg total) by mouth daily before breakfast.   furosemide (LASIX) 40 MG tablet Take 1 tablet (40 mg total) by mouth daily.   gabapentin (NEURONTIN) 300 MG capsule Take 2 capsules (600 mg total) by mouth 3 (three) times daily.   insulin glargine (LANTUS) 100 UNIT/ML injection Inject 40 Units into the skin every morning.   iron polysaccharides (NIFEREX) 150 MG capsule Take 1 capsule (150 mg total) by mouth daily.   metFORMIN (GLUCOPHAGE) 500 MG tablet Take 500 mg by mouth 2 (two) times daily with a meal.   metoprolol tartrate (LOPRESSOR) 50 MG tablet Take 1 tablet (50 mg total) by mouth 2 (two) times daily.   ondansetron (ZOFRAN) 4 MG tablet Take 1 tablet (4 mg total) by mouth every 6 (six) hours as needed for nausea.   pantoprazole (PROTONIX) 40 MG tablet TAKE 1 TABLET BY MOUTH DAILY AT NOON (Patient taking differently: Take 40 mg by mouth daily in the afternoon.)   tamsulosin (FLOMAX) 0.4 MG CAPS capsule Take 0.4 mg by mouth every other day.   traMADol (ULTRAM) 50 MG tablet Take 1 tablet (50 mg total) by mouth daily as needed (hand pain). (Patient taking differently: Take 50 mg by mouth daily as needed for moderate pain (hand pain).)   TYLENOL 500 MG tablet Take 500-1,000 mg by mouth every 6 (six) hours as needed for mild pain or headache.   No facility-administered encounter medications on file as of 05/10/2022.  Recent Relevant Labs: Lab Results  Component Value Date/Time   HGBA1C 8.7 (H) 03/11/2022 10:54 AM   HGBA1C 7.7 (A) 11/11/2021 10:06 AM   HGBA1C 7.6 (A) 05/11/2021 10:27 AM   HGBA1C 8.1 (H) 05/26/2020 10:11 AM   HGBA1C 8.4 11/21/2019 12:00 AM   HGBA1C 9.0 02/09/2018 12:00 AM   MICROALBUR <0.7 03/14/2019 08:37 AM   MICROALBUR 3.5 (H) 08/01/2017 09:40 AM    Kidney Function Lab Results  Component Value Date/Time   CREATININE 1.08 05/06/2022 04:30 AM    CREATININE 1.08 05/05/2022 04:33 AM   CREATININE 1.08 03/18/2015 11:31 AM   CREATININE 1.27 (H) 01/06/2015 10:26 AM   GFR 49.26 (L) 04/07/2022 11:19 AM   GFRNONAA >60 05/06/2022 04:30 AM   GFRAA 57 (L) 01/17/2020 11:14 AM    Star Rating Drugs:  Jardiance 25 mg daily - no fill date available Metformin 50 mg - no fill date available Lantus 100 u/mL - no fill date available   Care Gaps: Annual wellness visit in last year? Yes Last eye exam / retinopathy screening: 05/07/2022 Last diabetic foot exam: 08/04/2022   Future Appointments  Date Time Provider [place]  05/14/2022 11:30 AM [name], MD [facility]  05/17/2022  1:00 PM Rankin, Clent Demark, MD [facility] None  05/21/2022 10:05 AM Larina Earthly, Helane Gunther, NP [facility] None  06/04/2022 10:00 AM Gardiner Barefoot, DPM [facility]  06/10/2022 10:00 AM [name], MD [facility]  06/28/2022 11:20 AM [name], MD [facility]  07/08/2022 10:20 AM Martinique, Peter M, MD [facility] None  07/16/2022  2:15 PM [facility]  09/14/2022  3:30 PM Edythe Clarity, RPH [facility] None  03/17/2023  9:15 AM [facility] HEALTH COACH [facility]   April D Calhoun, Anthony Pharmacist Assistant [phone]

## 2022-05-14 ENCOUNTER — Ambulatory Visit (INDEPENDENT_AMBULATORY_CARE_PROVIDER_SITE_OTHER): Payer: Medicare Other | Admitting: Family Medicine

## 2022-05-14 VITALS — BP 112/60 | HR 113 | Temp 98.6°F | Ht 66.0 in | Wt 203.0 lb

## 2022-05-14 DIAGNOSIS — R42 Dizziness and giddiness: Secondary | ICD-10-CM

## 2022-05-14 DIAGNOSIS — K635 Polyp of colon: Secondary | ICD-10-CM

## 2022-05-14 DIAGNOSIS — D500 Iron deficiency anemia secondary to blood loss (chronic): Secondary | ICD-10-CM

## 2022-05-14 DIAGNOSIS — R55 Syncope and collapse: Secondary | ICD-10-CM

## 2022-05-14 DIAGNOSIS — R001 Bradycardia, unspecified: Secondary | ICD-10-CM

## 2022-05-14 DIAGNOSIS — N179 Acute kidney failure, unspecified: Secondary | ICD-10-CM

## 2022-05-14 LAB — CBC WITH DIFFERENTIAL/PLATELET
Basophils Absolute: 0.2 10*3/uL — ABNORMAL HIGH (ref 0.0–0.1)
Basophils Relative: 2.5 % (ref 0.0–3.0)
Eosinophils Absolute: 0.3 10*3/uL (ref 0.0–0.7)
Eosinophils Relative: 3.8 % (ref 0.0–5.0)
HCT: 35.3 % — ABNORMAL LOW (ref 39.0–52.0)
Hemoglobin: 10.9 g/dL — ABNORMAL LOW (ref 13.0–17.0)
Lymphocytes Relative: 17.2 % (ref 12.0–46.0)
Lymphs Abs: 1.5 10*3/uL (ref 0.7–4.0)
MCHC: 31 g/dL (ref 30.0–36.0)
MCV: 71.3 fl — ABNORMAL LOW (ref 78.0–100.0)
Monocytes Absolute: 0.7 10*3/uL (ref 0.1–1.0)
Monocytes Relative: 7.7 % (ref 3.0–12.0)
Neutro Abs: 6.2 10*3/uL (ref 1.4–7.7)
Neutrophils Relative %: 68.8 % (ref 43.0–77.0)
Platelets: 358 10*3/uL (ref 150.0–400.0)
RBC: 4.95 Mil/uL (ref 4.22–5.81)
RDW: 20.4 % — ABNORMAL HIGH (ref 11.5–15.5)
WBC: 9 10*3/uL (ref 4.0–10.5)

## 2022-05-14 LAB — PHOSPHORUS: Phosphorus: 4.4 mg/dL (ref 2.3–4.6)

## 2022-05-14 LAB — BASIC METABOLIC PANEL
BUN: 26 mg/dL — ABNORMAL HIGH (ref 6–23)
CO2: 29 mEq/L (ref 19–32)
Calcium: 9.4 mg/dL (ref 8.4–10.5)
Chloride: 103 mEq/L (ref 96–112)
Creatinine, Ser: 1.3 mg/dL (ref 0.40–1.50)
GFR: 47.87 mL/min — ABNORMAL LOW (ref >60.00)
Glucose, Bld: 216 mg/dL — ABNORMAL HIGH (ref 70–99)
Potassium: 4.4 mEq/L (ref 3.5–5.1)
Sodium: 141 mEq/L (ref 135–145)

## 2022-05-14 LAB — MAGNESIUM: Magnesium: 1.9 mg/dL (ref 1.5–2.5)

## 2022-05-14 NOTE — Progress Notes (Signed)
Subjective  CC:  Chief Complaint  Patient presents with   Hospitalization Follow-up    05/01/2022 - 05/06/2022 (5 days) [facility]     HPI: [name] is a 89 y.o. male who presents to the office today to address the problems listed above in the chief complaint. Follow-up hospitalization as noted above.  I reviewed all hospital notes, consult notes, lab values, chest x-ray, and colonoscopy study.  Had near syncope and bradycardia.  Hypotension.  Workup revealed worsening anemia.  We have been evaluating this and he had been referred to GI.  Fortunately, studies were done in the hospital, endoscopy upper and lower.  Worsening anemia thought to be due to large bleeding polyps.  He had 14 polyps removed.  No other findings were remarkable.  Placed on iron.  Cardiology medicines were adjusted, blood pressure responded well.  Bradycardia resolved.  He had mild acute kidney injury which had rebounded with IV fluids.  He is here for follow-up.  On the hospital about 8 days.  Feeling much better.  Energy level is fair.  Still gets mildly lightheaded if he gets up too quickly but if he does things slowly he feels fine.  No palpitations or chest pain. He has follow-up with GI and cardiology scheduled  Assessment  1. Bradycardia   2. Postural dizziness with presyncope   3. Iron deficiency anemia due to chronic blood loss   4. Polyp of colon, unspecified part of colon, unspecified type   5. AKI (acute kidney injury) ([place])      Plan  Anemia due to chronic blood loss causing hypotension, bradycardia and presyncope: Fortunately he has done well.  Adjustment of medications have been made and are stable.  Will recheck CBC, BMP, magnesium and phosphorus after lab work was done last week.  Ensure that hemoglobin is stable and renal function has improved.  He remains on iron and is tolerating it well.  Reviewed all discharge medications.  Listed below.  No changes made today. Outpatient Encounter  Medications as of 05/14/2022  Medication Sig   apixaban (ELIQUIS) 5 MG TABS tablet Take 1 tablet (5 mg total) by mouth 2 (two) times daily.   atorvastatin (LIPITOR) 40 MG tablet Take 1 tablet (40 mg total) by mouth daily at 6 PM. (Patient taking differently: Take 40 mg by mouth at bedtime.)   AVODART 0.5 MG capsule Take 0.5 mg by mouth every other day.   Cholecalciferol (VITAMIN D3 PO) Take 50 mcg by mouth daily.   empagliflozin (JARDIANCE) 25 MG TABS tablet Take 1 tablet (25 mg total) by mouth daily before breakfast.   furosemide (LASIX) 40 MG tablet Take 1 tablet (40 mg total) by mouth daily.   gabapentin (NEURONTIN) 300 MG capsule Take 2 capsules (600 mg total) by mouth 3 (three) times daily.   insulin glargine (LANTUS) 100 UNIT/ML injection Inject 40 Units into the skin every morning.   iron polysaccharides (NIFEREX) 150 MG capsule Take 1 capsule (150 mg total) by mouth daily.   metFORMIN (GLUCOPHAGE) 500 MG tablet Take 500 mg by mouth 2 (two) times daily with a meal.   metoprolol tartrate (LOPRESSOR) 50 MG tablet Take 1 tablet (50 mg total) by mouth 2 (two) times daily.   ondansetron (ZOFRAN) 4 MG tablet Take 1 tablet (4 mg total) by mouth every 6 (six) hours as needed for nausea.   pantoprazole (PROTONIX) 40 MG tablet TAKE 1 TABLET BY MOUTH DAILY AT NOON (Patient taking differently: Take 40 mg  by mouth daily in the afternoon.)   tamsulosin (FLOMAX) 0.4 MG CAPS capsule Take 0.4 mg by mouth every other day.   traMADol (ULTRAM) 50 MG tablet Take 1 tablet (50 mg total) by mouth daily as needed (hand pain). (Patient taking differently: Take 50 mg by mouth daily as needed for moderate pain (hand pain).)   TYLENOL 500 MG tablet Take 500-1,000 mg by mouth every 6 (six) hours as needed for mild pain or headache.   No facility-administered encounter medications on file as of 05/14/2022.     Follow up: As scheduled 06/10/2022  Orders Placed This Encounter  Procedures   Basic metabolic panel    CBC with Differential/Platelet   Magnesium   Phosphorus   No orders of the defined types were placed in this encounter.     I reviewed the patients updated PMH, FH, and SocHx.    Patient Active Problem List   Diagnosis Date Noted   Mild cognitive impairment 03/05/2021    Priority: High   Moderate nonproliferative diabetic retinopathy of left eye ([place]) 06/18/2020    Priority: High   Stage 3b chronic kidney disease ([place]) 05/26/2020    Priority: High   Aortic stenosis, moderate 05/22/2019    Priority: High   Chronic diastolic heart failure ([place]) 03/14/2019    Priority: High   Spinal stenosis of lumbar region 02/21/2018    Priority: High   Diabetic peripheral neuropathy associated with type 2 diabetes mellitus ([place]) 05/05/2017    Priority: High   Coronary artery disease involving native coronary artery of native heart without angina pectoris 06/13/2016    Priority: High   CVA (cerebral vascular accident) ([place]) 06/06/2016    Priority: High   Recurrent coronary arteriosclerosis after percutaneous transluminal coronary angioplasty 04/18/2015    Priority: High   CAD S/P PCI- Nov 2016     Priority: High   Chronic anticoagulation 02/06/2013    Priority: High   Chronic atrial fibrillation ([place]) 07/30/2011    Priority: High   Hx of CABG x 2 1990     Priority: High   Hypertension associated with diabetes ([place])     Priority: High   Diabetes mellitus with diabetic nephropathy, with long-term current use of insulin (HCC)     Priority: High   Combined hyperlipidemia associated with type 2 diabetes mellitus ([place])     Priority: High   Thoracic aortic aneurysm without rupture ([place]) 05/26/2020    Priority: Medium    Degeneration of lumbar intervertebral disc 02/21/2018    Priority: Medium    Chronic vertigo 03/15/2014    Priority: Medium    Obesity (BMI 30.0-34.9) 01/31/2012    Priority: Medium    Enlarged prostate without lower urinary tract symptoms (luts) 01/04/2011    Priority:  Medium    Gastro-esophageal reflux disease without esophagitis 12/07/2010    Priority: Medium    Epistaxis, recurrent 04/11/2015    Priority: Low   Osteoarthritis, knee 01/31/2012    Priority: Low   Allergic rhinitis 10/28/2011    Priority: Low   Hearing loss 06/08/2011    Priority: Low   Primary osteoarthritis of hand 06/08/2011    Priority: Low   Benign hematuria 08/27/2008    Priority: Low   Benign neoplasm of cecum 05/05/2022   Hiatal hernia 05/05/2022   Iron deficiency anemia due to chronic blood loss 05/03/2022   Heme positive stool 05/03/2022   Bradycardia 05/02/2022   AKI (acute kidney injury) ([place]) 05/02/2022   Lower GI bleeding 05/02/2022  Postural dizziness with presyncope 05/02/2022   Osteoarthritis of metacarpophalangeal (MCP) joint of left thumb 01/18/2022   Stable hemispheric central retinal vein occlusion (CRVO) of right eye 07/21/2020   Lower extremity edema 03/14/2019   Current Meds  Medication Sig   apixaban (ELIQUIS) 5 MG TABS tablet Take 1 tablet (5 mg total) by mouth 2 (two) times daily.   atorvastatin (LIPITOR) 40 MG tablet Take 1 tablet (40 mg total) by mouth daily at 6 PM. (Patient taking differently: Take 40 mg by mouth at bedtime.)   AVODART 0.5 MG capsule Take 0.5 mg by mouth every other day.   Cholecalciferol (VITAMIN D3 PO) Take 50 mcg by mouth daily.   empagliflozin (JARDIANCE) 25 MG TABS tablet Take 1 tablet (25 mg total) by mouth daily before breakfast.   furosemide (LASIX) 40 MG tablet Take 1 tablet (40 mg total) by mouth daily.   gabapentin (NEURONTIN) 300 MG capsule Take 2 capsules (600 mg total) by mouth 3 (three) times daily.   insulin glargine (LANTUS) 100 UNIT/ML injection Inject 40 Units into the skin every morning.   iron polysaccharides (NIFEREX) 150 MG capsule Take 1 capsule (150 mg total) by mouth daily.   metFORMIN (GLUCOPHAGE) 500 MG tablet Take 500 mg by mouth 2 (two) times daily with a meal.   metoprolol tartrate (LOPRESSOR) 50  MG tablet Take 1 tablet (50 mg total) by mouth 2 (two) times daily.   ondansetron (ZOFRAN) 4 MG tablet Take 1 tablet (4 mg total) by mouth every 6 (six) hours as needed for nausea.   pantoprazole (PROTONIX) 40 MG tablet TAKE 1 TABLET BY MOUTH DAILY AT NOON (Patient taking differently: Take 40 mg by mouth daily in the afternoon.)   tamsulosin (FLOMAX) 0.4 MG CAPS capsule Take 0.4 mg by mouth every other day.   traMADol (ULTRAM) 50 MG tablet Take 1 tablet (50 mg total) by mouth daily as needed (hand pain). (Patient taking differently: Take 50 mg by mouth daily as needed for moderate pain (hand pain).)   TYLENOL 500 MG tablet Take 500-1,000 mg by mouth every 6 (six) hours as needed for mild pain or headache.    Allergies: Patient is allergic to septra [sulfamethoxazole-trimethoprim], cefadroxil, ciprofloxacin, and erythromycin. Family History: Patient family history includes Brain cancer in his father; Throat cancer in his brother. Social History:  Patient  reports that he quit smoking about 64 years ago. His smoking use included cigarettes. He has a 30.00 pack-year smoking history. He has never used smokeless tobacco. He reports that he does not drink alcohol and does not use drugs.  Review of Systems: Constitutional: Negative for fever malaise or anorexia Cardiovascular: negative for chest pain Respiratory: negative for SOB or persistent cough Gastrointestinal: negative for abdominal pain  Objective  Vitals: BP 112/60   Pulse (!) 113   Temp 98.6 F (37 C)   Ht 5\' 6"  (1.676 m)   Wt 203 lb (92.1 kg)   SpO2 97%   BMI 32.77 kg/m  General: no acute distress , A&Ox3 HEENT: [place]ER[place], conjunctiva normal, neck is supple Cardiovascular: Irregularly irregular  respiratory:  Good breath sounds bilaterally, CTAB with normal respiratory effort Skin:  Warm, no rashes Extremities no edema  Lab Results  Component Value Date   CREATININE 1.08 05/06/2022   BUN 19 05/06/2022   NA 140 05/06/2022    K 3.9 05/06/2022   CL 108 05/06/2022   CO2 25 05/06/2022   Lab Results  Component Value Date   WBC 9.2 05/06/2022  HGB 9.4 (L) 05/06/2022   HCT 32.2 (L) 05/06/2022   MCV 75.9 (L) 05/06/2022   PLT 239 05/06/2022    Commons side effects, risks, benefits, and alternatives for medications and treatment plan prescribed today were discussed, and the patient expressed understanding of the given instructions. Patient is instructed to call or message via MyChart if he/she has any questions or concerns regarding our treatment plan. No barriers to understanding were identified. We discussed Red Flag symptoms and signs in detail. Patient expressed understanding regarding what to do in case of urgent or emergency type symptoms.  Medication list was reconciled, printed and provided to the patient in AVS. Patient instructions and summary information was reviewed with the patient as documented in the AVS. This note was prepared with assistance of Dragon voice recognition software. Occasional wrong-word or sound-a-like substitutions may have occurred due to the inherent limitations of voice recognition software

## 2022-05-17 ENCOUNTER — Encounter (INDEPENDENT_AMBULATORY_CARE_PROVIDER_SITE_OTHER): Payer: Medicare Other | Admitting: Ophthalmology

## 2022-05-17 DIAGNOSIS — E113392 Type 2 diabetes mellitus with moderate nonproliferative diabetic retinopathy without macular edema, left eye: Secondary | ICD-10-CM | POA: Diagnosis not present

## 2022-05-17 DIAGNOSIS — H348112 Central retinal vein occlusion, right eye, stable: Secondary | ICD-10-CM | POA: Diagnosis not present

## 2022-05-21 ENCOUNTER — Ambulatory Visit (INDEPENDENT_AMBULATORY_CARE_PROVIDER_SITE_OTHER): Payer: Medicare Other | Admitting: Internal Medicine

## 2022-05-21 ENCOUNTER — Encounter: Payer: Self-pay | Admitting: Nurse Practitioner

## 2022-05-21 ENCOUNTER — Encounter: Payer: Self-pay | Admitting: Internal Medicine

## 2022-05-21 ENCOUNTER — Ambulatory Visit: Payer: Medicare Other | Attending: Nurse Practitioner | Admitting: Nurse Practitioner

## 2022-05-21 VITALS — BP 125/72 | HR 96 | Temp 97.9°F | Ht 66.0 in | Wt 202.4 lb

## 2022-05-21 VITALS — BP 100/60 | HR 76 | Ht 66.0 in | Wt 202.6 lb

## 2022-05-21 DIAGNOSIS — I10 Essential (primary) hypertension: Secondary | ICD-10-CM | POA: Diagnosis not present

## 2022-05-21 DIAGNOSIS — I482 Chronic atrial fibrillation, unspecified: Secondary | ICD-10-CM | POA: Diagnosis not present

## 2022-05-21 DIAGNOSIS — I5032 Chronic diastolic (congestive) heart failure: Secondary | ICD-10-CM | POA: Insufficient documentation

## 2022-05-21 DIAGNOSIS — R001 Bradycardia, unspecified: Secondary | ICD-10-CM | POA: Insufficient documentation

## 2022-05-21 DIAGNOSIS — Z8719 Personal history of other diseases of the digestive system: Secondary | ICD-10-CM | POA: Diagnosis not present

## 2022-05-21 DIAGNOSIS — Z8673 Personal history of transient ischemic attack (TIA), and cerebral infarction without residual deficits: Secondary | ICD-10-CM | POA: Insufficient documentation

## 2022-05-21 DIAGNOSIS — I350 Nonrheumatic aortic (valve) stenosis: Secondary | ICD-10-CM

## 2022-05-21 DIAGNOSIS — I7121 Aneurysm of the ascending aorta, without rupture: Secondary | ICD-10-CM | POA: Insufficient documentation

## 2022-05-21 DIAGNOSIS — Z794 Long term (current) use of insulin: Secondary | ICD-10-CM | POA: Diagnosis not present

## 2022-05-21 DIAGNOSIS — R55 Syncope and collapse: Secondary | ICD-10-CM | POA: Diagnosis not present

## 2022-05-21 DIAGNOSIS — E785 Hyperlipidemia, unspecified: Secondary | ICD-10-CM | POA: Insufficient documentation

## 2022-05-21 DIAGNOSIS — R42 Dizziness and giddiness: Secondary | ICD-10-CM | POA: Diagnosis not present

## 2022-05-21 DIAGNOSIS — E119 Type 2 diabetes mellitus without complications: Secondary | ICD-10-CM | POA: Diagnosis not present

## 2022-05-21 DIAGNOSIS — I2510 Atherosclerotic heart disease of native coronary artery without angina pectoris: Secondary | ICD-10-CM | POA: Insufficient documentation

## 2022-05-21 DIAGNOSIS — Z20822 Contact with and (suspected) exposure to covid-19: Secondary | ICD-10-CM

## 2022-05-21 DIAGNOSIS — U071 COVID-19: Secondary | ICD-10-CM | POA: Insufficient documentation

## 2022-05-21 LAB — POC COVID19 BINAXNOW: SARS Coronavirus 2 Ag: POSITIVE — AB

## 2022-05-21 MED ORDER — NIRMATRELVIR/RITONAVIR (PAXLOVID)TABLET: 1.0000 | ORAL_TABLET | Freq: Two times a day (BID) | ORAL | 0 refills | Status: AC

## 2022-05-21 NOTE — Patient Instructions (Signed)
Medication Instructions:  Your physician recommends that you continue on your current medications as directed. Please refer to the Current Medication list given to you today.  *If you need a refill on your cardiac medications before your next appointment, please call your pharmacy*   Lab Work: NONE ordered at this time of appointment   If you have labs (blood work) drawn today and your tests are completely normal, you will receive your results only by: [place] (if you have MyChart) OR A paper copy in the mail If you have any lab test that is abnormal or we need to change your treatment, we will call you to review the results.   Testing/Procedures: NONE ordered at this time of appointment     Follow-Up: At [facility], you and your health needs are our priority.  As part of our continuing mission to provide you with exceptional heart care, we have created designated Provider Care Teams.  These Care Teams include your primary Cardiologist (physician) and Advanced Practice Providers (APPs -  Physician Assistants and Nurse Practitioners) who all work together to provide you with the care you need, when you need it.  We recommend signing up for the patient portal called "MyChart".  Sign up information is provided on this After Visit Summary.  MyChart is used to connect with patients for Virtual Visits (Telemedicine).  Patients are able to view lab/test results, encounter notes, upcoming appointments, etc.  Non-urgent messages can be sent to your provider as well.   To learn more about what you can do with MyChart, go to [email or web address].    Your next appointment:    Keep follow up   Provider:   Peter Martinique, MD     Other Instructions

## 2022-05-21 NOTE — Progress Notes (Addendum)
Office Visit    Patient Name: [name] Date of Encounter: 05/21/2022  Primary Care Provider:  [name], MD Primary Cardiologist:  Peter Martinique, MD  Chief Complaint    89 year old male with a history of CAD s/p CABG in 1990 with subsequent DES-RCA in 2006, permanent atrial fibrillation, chronic diastolic heart failure, moderate to severe aortic stenosis, bradycardia, presyncope, hypertension, hyperlipidemia, CVA, carotid artery disease, GI bleed, and type 2 diabetes who presents for hospital follow-up related to atrial fibrillation, bradycardia and presyncope.  Past Medical History    Past Medical History:  Diagnosis Date   Abdominal pain    Acute renal failure ([place])     resolved   Arthritis    "hands & legs" (11/'10/2014)   Ataxia    Atrial fibrillation ([facility])    Bladder outlet obstruction    Bladder outlet obstruction    BPH (benign prostatic hyperplasia)    CAD (coronary artery disease)    a. CABG IN 1989. b. 01/08/2015 CTO of ost LAD, LIMA to LAD not visualized but assumed patent given myoview finding, occluded SVG to diagonal, 99% mid RCA tx w/ SYNERGY DES 3X28 mm   Constipation    Diabetes mellitus type 2, insulin dependent ([place])    Epistaxis, recurrent Feb 2017   GERD (gastroesophageal reflux disease)    HTN (hypertension)    Hx of bacterial pneumonia    Hyperlipemia    Kidney stones    Mild aortic stenosis    Mild cognitive impairment 03/05/2021   MMSE 26/30 and 6CIT 9 03/2021 AWV   Pneumonia 04/2019   Rib fractures    left rib fractures being treated with pain medications   Stented coronary artery Nov 2016   RCA DES   Urinary tract infection     Enterococcus   Past Surgical History:  Procedure Laterality Date   [place] N/A 01/08/2015   Procedure: Left Heart Cath and Cors/Grafts Angiography;  Surgeon: Peter M Martinique, MD;  Location: [facility];  Service: Cardiovascular;  Laterality: N/A;    CARDIAC CATHETERIZATION  01/08/2015   Procedure: Coronary Stent Intervention;  Surgeon: Peter M Martinique, MD;  Location: [facility];  Service: Cardiovascular;;   CARPAL TUNNEL RELEASE Right 08/2010   CATARACT EXTRACTION W/ INTRAOCULAR LENS  IMPLANT, BILATERAL Bilateral    COLONOSCOPY WITH PROPOFOL N/A 05/05/2022   Procedure: COLONOSCOPY WITH PROPOFOL;  Surgeon: Irene Shipper, MD;  Location: [facility];  Service: Gastroenterology;  Laterality: N/A;   CORONARY ANGIOPLASTY  01/08/15   RCA DES   CORONARY ARTERY BYPASS GRAFT  1989   "CABG X 2"   ESOPHAGOGASTRODUODENOSCOPY (EGD) WITH PROPOFOL N/A 05/05/2022   Procedure: ESOPHAGOGASTRODUODENOSCOPY (EGD) WITH PROPOFOL;  Surgeon: Irene Shipper, MD;  Location: [facility];  Service: Gastroenterology;  Laterality: N/A;   IR THORACENTESIS ASP PLEURAL SPACE W/IMG GUIDE  05/14/2019   JOINT REPLACEMENT     POLYPECTOMY  05/05/2022   Procedure: POLYPECTOMY;  Surgeon: Irene Shipper, MD;  Location: [facility];  Service: Gastroenterology;;   TONSILLECTOMY     TOTAL HIP ARTHROPLASTY Right 2000    Allergies  Allergies  Allergen Reactions   Septra [Sulfamethoxazole-Trimethoprim] Itching   Cefadroxil Other (See Comments)    Unknown reaction    Ciprofloxacin Other (See Comments)    Unknown action     Erythromycin Other (See Comments)    Upsets the stomach      Labs/Other Studies Reviewed  The following studies were reviewed today: TTE 13-May-2022: IMPRESSIONS   1. Left ventricular ejection fraction, by estimation, is 60 to 65%. The  left ventricle has normal function. The left ventricle has no regional  wall motion abnormalities. There is mild left ventricular hypertrophy.  Left ventricular diastolic parameters  are indeterminate.   2. Right ventricular systolic function was not well visualized. The right  ventricular size is not well visualized. RV is poorly visualized but  function appears moderately reduced   3. The mitral valve is normal  in structure. No evidence of mitral valve  regurgitation. No evidence of mitral stenosis.   4. Left atrial size was severely dilated.   5. Aortic dilatation noted. There is dilatation of the ascending aorta,  measuring 40 mm.   6. The aortic valve is tricuspid. There is severe calcification of the  aortic valve. Aortic valve regurgitation is mild. Moderate to severe  aortic valve stenosis. Moderate AS by gradients (MG 24 mmHg, Vmax 3.0  m/s), severe by AVA (0.7cm^2) and DI  (0.24). Low SV index (28 cc/m^2), suspect paradoxical low flow low  gradient severe AS   Echo 10/10/21:   IMPRESSIONS   1. Left ventricular ejection fraction, by estimation, is 55 to 60%. The  left ventricle has normal function. The left ventricle has no regional  wall motion abnormalities. There is moderate concentric left ventricular  hypertrophy. Left ventricular  diastolic function could not be evaluated. There is the interventricular  septum is flattened in systole and diastole, consistent with right  ventricular pressure and volume overload.   2. Right ventricular systolic function is mildly reduced. The right  ventricular size is mildly enlarged. There is normal pulmonary artery  systolic pressure. The estimated right ventricular systolic pressure is  [id number] mmHg.   3. Left atrial size was mildly dilated.   4. The mitral valve is degenerative. Trivial mitral valve regurgitation.  No evidence of mitral stenosis.   5. The aortic valve is tricuspid. There is moderate calcification of the  aortic valve. There is moderate thickening of the aortic valve. Aortic  valve regurgitation is trivial. Mild to moderate aortic valve stenosis.  Aortic valve area, by VTI measures  1.05 cm. Aortic valve mean gradient measures 19.3 mmHg. Aortic valve Vmax  measures 2.92 m/s.   6. Asc aorta up to 40 mm on this study. 48 mm on last study. CT scan  recorded 43 mm. Would defer to cross sectional imaging (CT). There is mild   dilatation of the ascending aorta, measuring 40 mm.   7. The inferior vena cava is normal in size with greater than 50%  respiratory variability, suggesting right atrial pressure of 3 mmHg.   Comparison(s): No significant change from prior study.  Left heart catheterization 2016: Prox RCA lesion, 40% stenosed. LM lesion, 20% stenosed. Ost LAD to Prox LAD lesion, 100% stenosed. SVG . Origin lesion, 100% stenosed. Mid RCA-2 lesion, 70% stenosed. Mid RCA-1 lesion, 99% stenosed. Post intervention, there is a 0% residual stenosis.   1. Severe 2 vessel obstructive CAD. CTO of the origin of the LAD. Critical mid RCA stenosis. 2. Occluded SVG to the diagonal 3. LIMA to the LAD was not visualized but assumed patent based on clinical history and Myoview findings. 4. Normal LV EDP. 5. Successful stenting of the Mid RCA with DES. Very difficult procedure due to vessel tortuosity.   Plan: DAPT with ASA and Plavix for one month then stop ASA and continue Plavix for at least one  year. Resume Coumadin tomorrow. Will assess LV function with an Echo. Stop prilosec and start protonix. Anticipate DC in am if stable. Patient noted to be bradycardic throughout case so will reduce metoprolol to 50 mg daily.  Recent Labs: 03/11/2022: TSH 1.81 05/01/2022: B Natriuretic Peptide 317.3 05/06/2022: ALT 16 05/14/2022: BUN 26; Creatinine, Ser 1.30; Hemoglobin 10.9; Magnesium 1.9; Platelets 358.0; Potassium 4.4; Sodium 141  Recent Lipid Panel    Component Value Date/Time   CHOL 147 03/11/2022 1054   TRIG 75.0 03/11/2022 1054   HDL 52.30 03/11/2022 1054   CHOLHDL 3 03/11/2022 1054   VLDL 15.0 03/11/2022 1054   Anchor 80 03/11/2022 1054    History of Present Illness    89 year old male with the above past medical history including CAD s/p CABG in 1990 with subsequent DES-RCA in 2006, permanent atrial fibrillation, chronic diastolic heart failure, moderate to severe aortic stenosis, bradycardia, presyncope,  hypertension, hyperlipidemia, CVA, carotid artery disease, GI bleed, and type 2 diabetes.  His atrial fibrillation has been rate controlled on metoprolol and diltiazem.  He is on Eliquis for anticoagulation.  He has been stable on Lasix 40 mg daily for chronic diastolic heart failure.  He was hospitalized in 2021 with acute respiratory failure secondary to pneumonia, pleural effusion, managed with IV antibiotics for thoracentesis.  He was hospitalized again in 09/2021 with sepsis due to pneumonia, managed with antibiotics.  He was readmitted later that month with CHF.  Echocardiogram in 09/2021 showed EF 55 to 60%, interventricular septum flattening in systole and diastole consistent with RV pressure and volume overload, mild RV systolic dysfunction, mild BAE, mild to moderate aortic stenosis.  He was last seen in the office 01/28/2019.  And was stable from a cardiac standpoint.  He presented to the ED on 05/01/2022 with shortness of breath, dizziness, and presyncope. Prior to this he was following with GI for ongoing issues with GI bleeding and progressive blood loss anemia.  He was scheduled for outpatient colonoscopy.  Upon arrival to the ED he was hypotensive and bradycardic (HR in the 40s-60s). Cardiology was consulted in the setting of bradycardia.  Toprol and diltiazem were held with improvement in HR.  Eliquis was held in the setting of GI bleed.  He tolerated reintroduction of low-dose metoprolol.  Repeat echocardiogram showed progressive aortic stenosis, moderate to severe. Endoscopy showed no significant bleeding.  He was noted to have moderate hiatal hernia.  GI recommended resuming anticoagulation with Sunday after hospital discharge.  He was discharged home in stable condition on 05/06/2022.  He presents today for follow-up.  Since his hospitalization he has done well from a cardiac standpoint.  He denies any further dizziness, presyncope, syncope.  Denies bleeding.  Unfortunately, his wife was just  diagnosed with COVID-19.  He is asymptomatic but plans to have a test done through his PCP later today.  HR is stable, BP is stable as well.  Overall, he reports feeling well. He turns 89 tomorrow.    Home Medications    Current Outpatient Medications  Medication Sig Dispense Refill   apixaban (ELIQUIS) 5 MG TABS tablet Take 1 tablet (5 mg total) by mouth 2 (two) times daily. 60 tablet 2   atorvastatin (LIPITOR) 40 MG tablet Take 1 tablet (40 mg total) by mouth daily at 6 PM. (Patient taking differently: Take 40 mg by mouth at bedtime.) 30 tablet 2   AVODART 0.5 MG capsule Take 0.5 mg by mouth every other day.     Cholecalciferol (VITAMIN D3  PO) Take 50 mcg by mouth daily.     empagliflozin (JARDIANCE) 25 MG TABS tablet Take 1 tablet (25 mg total) by mouth daily before breakfast. 30 tablet    furosemide (LASIX) 40 MG tablet Take 1 tablet (40 mg total) by mouth daily. 90 tablet 3   gabapentin (NEURONTIN) 300 MG capsule Take 2 capsules (600 mg total) by mouth 3 (three) times daily. 180 capsule 5   insulin glargine (LANTUS) 100 UNIT/ML injection Inject 40 Units into the skin every morning.     iron polysaccharides (NIFEREX) 150 MG capsule Take 1 capsule (150 mg total) by mouth daily. 30 capsule 0   metFORMIN (GLUCOPHAGE) 500 MG tablet Take 500 mg by mouth 2 (two) times daily with a meal.     metoprolol tartrate (LOPRESSOR) 50 MG tablet Take 1 tablet (50 mg total) by mouth 2 (two) times daily. 60 tablet 0   ondansetron (ZOFRAN) 4 MG tablet Take 1 tablet (4 mg total) by mouth every 6 (six) hours as needed for nausea. 20 tablet 0   pantoprazole (PROTONIX) 40 MG tablet TAKE 1 TABLET BY MOUTH DAILY AT NOON (Patient taking differently: Take 40 mg by mouth daily in the afternoon.) 90 tablet 3   tamsulosin (FLOMAX) 0.4 MG CAPS capsule Take 0.4 mg by mouth every other day.     traMADol (ULTRAM) 50 MG tablet Take 1 tablet (50 mg total) by mouth daily as needed (hand pain). (Patient taking differently: Take 50  mg by mouth daily as needed for moderate pain (hand pain).) 20 tablet 1   TYLENOL 500 MG tablet Take 500-1,000 mg by mouth every 6 (six) hours as needed for mild pain or headache.     nirmatrelvir/ritonavir (PAXLOVID) 20 x 150 MG & 10 x 100MG  TABS Take 1 tablet by mouth 2 (two) times daily for 5 days. Dose reduction for moderate renal impairment (eGFR >/= 30 to <60 mL/min): 150 mg nirmatrelvir (one 150 mg tablet) with 100 mg ritonavir (one 100 mg tablet), with both tablets taken together twice daily for 5 days.  While taking this, Reduce dosage to half dose for apixiban and hold metformin and atorvastatin.  Only take if COVID symptoms worsening. 10 tablet 0   No current facility-administered medications for this visit.     Review of Systems    He denies chest pain, palpitations, dyspnea, pnd, orthopnea, n, v, dizziness, syncope, edema, weight gain, or early satiety. All other systems reviewed and are otherwise negative except as noted above.   Physical Exam    VS:  BP 100/60   Pulse 76   Ht 5\' 6"  (1.676 m)   Wt 202 lb 9.6 oz (91.9 kg)   SpO2 97%   BMI 32.70 kg/m   GEN: Well nourished, well developed, in no acute distress. HEENT: normal. Neck: Supple, no JVD, carotid bruits, or masses. Cardiac: RRR, no murmurs, rubs, or gallops. No clubbing, cyanosis, edema.  Radials/DP/PT 2+ and equal bilaterally.  Respiratory:  Respirations regular and unlabored, clear to auscultation bilaterally. GI: Soft, nontender, nondistended, BS + x 4. MS: no deformity or atrophy. Skin: warm and dry, no rash. Neuro:  Strength and sensation are intact. Psych: Normal affect.  Accessory Clinical Findings    ECG personally reviewed by me today -atrial fibrillation, 87 bpm- no acute changes.   Lab Results  Component Value Date   WBC 9.0 05/14/2022   HGB 10.9 (L) 05/14/2022   HCT 35.3 (L) 05/14/2022   MCV 71.3 (L) 05/14/2022  PLT 358.0 05/14/2022   Lab Results  Component Value Date   CREATININE 1.30  05/14/2022   BUN 26 (H) 05/14/2022   NA 141 05/14/2022   K 4.4 05/14/2022   CL 103 05/14/2022   CO2 29 05/14/2022   Lab Results  Component Value Date   ALT 16 05/06/2022   AST 19 05/06/2022   ALKPHOS 52 05/06/2022   BILITOT 0.8 05/06/2022   Lab Results  Component Value Date   CHOL 147 03/11/2022   HDL 52.30 03/11/2022   LDLCALC 80 03/11/2022   TRIG 75.0 03/11/2022   CHOLHDL 3 03/11/2022    Lab Results  Component Value Date   HGBA1C 8.7 (H) 03/11/2022    Assessment & Plan    1. Permanent atrial fibrillation/bradycardia: Recent bradycardia, metoprolol was decreased, diltiazem was held.  HR stable today on 50mg  of metoprolol twice daily.  Symptomatic.  Continue metoprolol, Eliquis.  2. Presyncope: Occurred in the setting of symptomatic anemia, hypotension and bradycardia.  Repeat echocardiogram showed progression of aortic stenosis, otherwise stable.  He denies any further dizziness, presyncope, syncope.  HR and BP have stabilized.  No indication for further testing at this time.  3. CAD:  S/p CABG in 1990 with subsequent DES-RCA in 2006. Stable with no anginal symptoms. No indication for ischemic evaluation.  Continue metoprolol, Lipitor.  4. Chronic diastolic heart failure: Most recent echo stable.  Euvolemic and well compensated on exam.  Continue Lasix.  5. Aortic stenosis: Most recent echo showed progression of aortic stenosis from mild to moderate to moderate to severe (mean gradient increased from 19.3 mmHg in 09/2021 to 24.0 mm Hg in 04/2022.  Asymptomatic.  Will defer timing of repeat echo to Dr. Martinique (6 months vs. 1 year).  ADDENDUM 05/28/2022: Per Dr. Martinique, unless symptoms progress ok to repeat Echo in one year.   6. Hypertension: BP well controlled. Continue current antihypertensive regimen.   7. Hyperlipidemia: LDL was 80 in 03/2022.  Continue Lipitor.  8. Dilation of ascending aorta: Stable at 40 mm on most recent echo.  9. History of CVA: No recurrence.  Continue Eliquis, Lipitor.  10. History of GI bleed: Following with GI.  11. Type 2 diabetes: A1c was 8.7 in 03/2022.  Monitored and managed per PCP.  12. Disposition: Follow-up as scheduled with Dr. Martinique in 06/2022.     Lenna Sciara, NP 05/21/2022, 1:02 PM

## 2022-05-21 NOTE — Progress Notes (Signed)
ACUTE VISIT, SUSPECTED COVID-19  Subjective:   He presents with symptoms concerning for COVID-19. Please see prescreen note for history and ROS. Chief Complaint  Patient presents with   Covid Exposure    His wife tested positive for COVID yesterday.   Slight runny nose    No other symptoms.  In shock when advised he has COVID because he feels fine and denies any systemic signs and symptoms such as fever, myalgias, malaise, fatigue, chills    Patient Active Problem List   Diagnosis Date Noted   COVID 05/21/2022   Benign neoplasm of cecum 05/05/2022   Hiatal hernia 05/05/2022   Iron deficiency anemia due to chronic blood loss 05/03/2022   Heme positive stool 05/03/2022   Bradycardia 05/02/2022   AKI (acute kidney injury) ([place]) 05/02/2022   Lower GI bleeding 05/02/2022   Postural dizziness with presyncope 05/02/2022   Osteoarthritis of metacarpophalangeal (MCP) joint of left thumb 01/18/2022   Mild cognitive impairment 03/05/2021   Stable hemispheric central retinal vein occlusion (CRVO) of right eye 07/21/2020   Moderate nonproliferative diabetic retinopathy of left eye ([place]) 06/18/2020   Thoracic aortic aneurysm without rupture ([place]) 05/26/2020   Stage 3b chronic kidney disease ([place]) 05/26/2020   Aortic stenosis, moderate 05/22/2019   Chronic diastolic heart failure ([place]) 03/14/2019   Lower extremity edema 03/14/2019   Spinal stenosis of lumbar region 02/21/2018   Degeneration of lumbar intervertebral disc 02/21/2018   Diabetic peripheral neuropathy associated with type 2 diabetes mellitus ([place]) 05/05/2017   Coronary artery disease involving native coronary artery of native heart without angina pectoris 06/13/2016   CVA (cerebral vascular accident) ([place]) 06/06/2016   Recurrent coronary arteriosclerosis after percutaneous transluminal coronary angioplasty 04/18/2015   Epistaxis, recurrent 04/11/2015   CAD S/P PCI- Nov 2016    Chronic vertigo 03/15/2014   Chronic  anticoagulation 02/06/2013   Obesity (BMI 30.0-34.9) 01/31/2012   Osteoarthritis, knee 01/31/2012   Allergic rhinitis 10/28/2011   Chronic atrial fibrillation ([place]) 07/30/2011   Hearing loss 06/08/2011   Primary osteoarthritis of hand 06/08/2011   Enlarged prostate without lower urinary tract symptoms (luts) 01/04/2011   Gastro-esophageal reflux disease without esophagitis 12/07/2010   Hx of CABG x 2 1990    Hypertension associated with diabetes ([place])    Diabetes mellitus with diabetic nephropathy, with long-term current use of insulin ([place])    Combined hyperlipidemia associated with type 2 diabetes mellitus ([place])    Benign hematuria 08/27/2008   He reports that he quit smoking about 64 years ago. His smoking use included cigarettes. He has a 30.00 pack-year smoking history. He has never used smokeless tobacco. He reports that he does not drink alcohol and does not use drugs.  Current Outpatient Medications:    apixaban (ELIQUIS) 5 MG TABS tablet, Take 1 tablet (5 mg total) by mouth 2 (two) times daily., Disp: 60 tablet, Rfl: 2   atorvastatin (LIPITOR) 40 MG tablet, Take 1 tablet (40 mg total) by mouth daily at 6 PM. (Patient taking differently: Take 40 mg by mouth at bedtime.), Disp: 30 tablet, Rfl: 2   AVODART 0.5 MG capsule, Take 0.5 mg by mouth every other day., Disp: , Rfl:    Cholecalciferol (VITAMIN D3 PO), Take 50 mcg by mouth daily., Disp: , Rfl:    empagliflozin (JARDIANCE) 25 MG TABS tablet, Take 1 tablet (25 mg total) by mouth daily before breakfast., Disp: 30 tablet, Rfl:    furosemide (LASIX) 40 MG tablet, Take 1 tablet (40 mg total) by  mouth daily., Disp: 90 tablet, Rfl: 3   gabapentin (NEURONTIN) 300 MG capsule, Take 2 capsules (600 mg total) by mouth 3 (three) times daily., Disp: 180 capsule, Rfl: 5   insulin glargine (LANTUS) 100 UNIT/ML injection, Inject 40 Units into the skin every morning., Disp: , Rfl:    iron polysaccharides (NIFEREX) 150 MG capsule, Take 1 capsule  (150 mg total) by mouth daily., Disp: 30 capsule, Rfl: 0   metFORMIN (GLUCOPHAGE) 500 MG tablet, Take 500 mg by mouth 2 (two) times daily with a meal., Disp: , Rfl:    metoprolol tartrate (LOPRESSOR) 50 MG tablet, Take 1 tablet (50 mg total) by mouth 2 (two) times daily., Disp: 60 tablet, Rfl: 0   ondansetron (ZOFRAN) 4 MG tablet, Take 1 tablet (4 mg total) by mouth every 6 (six) hours as needed for nausea., Disp: 20 tablet, Rfl: 0   pantoprazole (PROTONIX) 40 MG tablet, TAKE 1 TABLET BY MOUTH DAILY AT NOON (Patient taking differently: Take 40 mg by mouth daily in the afternoon.), Disp: 90 tablet, Rfl: 3   tamsulosin (FLOMAX) 0.4 MG CAPS capsule, Take 0.4 mg by mouth every other day., Disp: , Rfl:    traMADol (ULTRAM) 50 MG tablet, Take 1 tablet (50 mg total) by mouth daily as needed (hand pain). (Patient taking differently: Take 50 mg by mouth daily as needed for moderate pain (hand pain).), Disp: 20 tablet, Rfl: 1   TYLENOL 500 MG tablet, Take 500-1,000 mg by mouth every 6 (six) hours as needed for mild pain or headache., Disp: , Rfl:   Allergies  Allergen Reactions   Septra [Sulfamethoxazole-Trimethoprim] Itching   Cefadroxil Other (See Comments)    Unknown reaction    Ciprofloxacin Other (See Comments)    Unknown action     Erythromycin Other (See Comments)    Upsets the stomach     Objective:    Blood pressure 125/72, pulse 96, temperature 97.9 F (36.6 C), temperature source Temporal, height 5\' 6"  (1.676 m), weight 202 lb 6.4 oz (91.8 kg), SpO2 98 %.  General appearance: alert, cooperative, and no distress. Head: normocephalic, without obvious abnormality, atraumatic. Eyes: conjunctivae anicteric, not injected Nose: Nares normal. Septum midline. Mucosa normal. No drainage or sinus tenderness.. Throat: lips, mucosa, and tongue normal; teeth and gums normal. Lungs: clear to auscultation bilaterally and no coughing . Heart: regular rate and rhythm, no murmur  appreciated. Extremities: extremities normal, atraumatic, no cyanosis or edema.  Results for orders placed or performed in visit on 05/21/22  POC COVID-19  Result Value Ref Range   SARS Coronavirus 2 Ag Positive (A) Negative    Assessment & Plan:   1. COVID   2. Close exposure to COVID-19 virus    Patients chart review and interview were used to generate a prompt for artificial intelligence analysis ([employer] artificial intelligence) clinical decision support.  AI output was reviewed and is provided in red:   The Question 89 year old male with mild symptoms of covid just a runny nose for about a week. gfr is 45. Medications are: Cholecalciferol, acetaminophen, apixaban, atorvastatin, dutasteride, empagliflozin, furosemide, gabapentin, insulin glargine, iron polysaccharides, metFORMIN, metoprolol tartrate, ondansetron, pantoprazole, tamsulosin, and traMADol. would like to have paxlovid in case symptoms worsen. needs to know dosage and what other medications to hold during treatment. Design Strategy To address this question, we will leverage our comprehensive understanding of pharmacology, renal physiology, and the management of COVID-19 in patients with complex medication regimens and comorbidities. Our strategy involves analyzing the  patient's current medications, renal function, and the pharmacokinetics and pharmacodynamics of Paxlovid. We will ensure the absolute accuracy and precision in recommending the appropriate dosage of Paxlovid while identifying any potential drug interactions, especially considering the patient's reduced glomerular filtration rate (GFR). Execute Strategy Upon analyzing the patient's medication list and clinical scenario: Paxlovid (nirmatrelvir/ritonavir) is indicated for the treatment of COVID-19 in patients at high risk for progression to severe disease. The standard dosing regimen needs adjustment in patients with renal impairment. The patient's GFR of 45  mL/min falls into the category of moderate renal impairment, necessitating a dose adjustment for Paxlovid. Key medications that may interact with Paxlovid include apixaban, atorvastatin, and metformin. Paxlovid can increase the plasma concentrations of drugs that are substrates of CYP3A, leading to potential adverse effects. Apixaban: Increased risk of bleeding due to increased exposure. Atorvastatin: Increased risk of statin-related side effects, including myopathy and rhabdomyolysis. Metformin: While not directly interacting through CYP3A, careful monitoring for lactic acidosis is advised in the context of renal impairment. Systematically Ensure Accuracy & Precision Re-evaluating the patient's needs and the pharmacological properties of Paxlovid, alongside the patient's renal function and potential drug-drug interactions: The adjusted dose of Paxlovid for patients with a GFR of 30 to <60 mL/min is nirmatrelvir 150 mg with ritonavir 100 mg, taken orally twice daily for 5 days. It is prudent to consult with a pharmacist or utilize a drug interaction checker to assess the necessity of dose adjustments or temporary discontinuation of apixaban and atorvastatin during Paxlovid treatment. Close monitoring is advised. For metformin, considering the patient's GFR and potential for further renal function decline during COVID-19 infection, monitoring for signs of lactic acidosis is recommended, with consideration for dose adjustment or temporary discontinuation as per clinical judgment. Final Answer For the 89 year old male with COVID-19 and a GFR of 45 mL/min, the adjusted dosage of Paxlovid is nirmatrelvir 150 mg with ritonavir 100 mg, taken orally twice daily for 5 days. Careful consideration should be given to potential drug interactions, particularly with apixaban and atorvastatin, which may require dose adjustments or temporary discontinuation during Paxlovid treatment, in consultation with a pharmacist or  through drug interaction resources. Metformin should be closely monitored for signs of lactic acidosis, with adjustments made as necessary. This approach ensures the patient receives effective treatment for COVID-19 while minimizing the risk of adverse drug interactions and side effects, aligning with the principles of safe and patient-centered care.   Reviewed expectations re: course of current medical issues. Discussed self-management of symptoms. Outlined signs and symptoms indicating need for more acute intervention. Patient verbalized understanding and all questions were answered. See orders for this visit as documented in the electronic medical record.   Loralee Pacas, MD 05/21/2022

## 2022-05-24 ENCOUNTER — Telehealth: Payer: Self-pay | Admitting: Family Medicine

## 2022-05-24 NOTE — Telephone Encounter (Signed)
Symptoms have worsen. Asking if he should take Paxlovid now? Nurse advised to see provider within 24 hrs.  Patient Name: [name] Gender: Male DOB: Sep 24, 1928 Age: 89 Y 1 D Return Phone Number: [phone] (Primary) Address: City/ State/ Zip: [place]  [address] Client [facility] Client Site [facility] Night Provider Billey Chang- MD Contact Type Call Who Is Calling Patient / Member / Family / Caregiver Call Type Triage / Clinical Caller Name Johnedward Bladen Relationship To Patient Spouse Return Phone Number [phone] (Primary) Chief Complaint Fever (non-urgent symptom) (greater than THREE MONTHS old) Reason for Call Symptomatic / Request for [place] states her husband was prescribed paxlovide but was told to not take it unless symptoms have worsened, wondering if he can take it now. He has a fever, spitting up phlegm, weak, no appetite. Cannot keep anything down. Translation No Nurse Assessment Nurse: Jearld Pies, RN, Lovena Le Date/Time Eilene Ghazi Time): 05/23/2022 11:52:00 AM Confirm and document reason for call. If symptomatic, describe symptoms. ---Pt started having symptoms on Friday. Tested positive for covid on Friday. Pt has cough, fatigue, and no appetite. Drinking fluids normally. Last urinated 10am. No thermometer. Denies SOB, chest pain, or any other symptoms at this time. Does the patient have any new or worsening symptoms? ---Yes Will a triage be completed? ---Yes Related visit to physician within the last 2 weeks? ---Yes Does the PT have any chronic conditions? (i.e. diabetes, asthma, this includes High risk factors for pregnancy, etc.) ---Unknown Is this a behavioral health or substance abuse call? ---No Guidelines Guideline Title Affirmed Question Affirmed Notes Nurse Date/Time ([place] Time) COVID-19 - Diagnosed or Suspected [1] HIGH  RISK patient (e.g., weak immune system, age Jake Bathe 05/23/2022 11:54:34 AM Guidelines Guideline Title Affirmed Question Affirmed Notes Nurse Date/Time Eilene Ghazi Time) COVID-19 - Diagnosed or Suspected [1] HIGH RISK patient (e.g., weak immune system, age Jake Bathe 05/23/2022 11:54:34 AMate/Time Eilene Ghazi Time) > 64 years, obesity with BMI 30 or higher, pregnant, chronic lung disease or other chronic medical condition) AND [2] COVID symptoms (e.g., cough, fever) (Exceptions: Already seen by PCP and no new or worsening symptoms.) Disp. Time Eilene Ghazi Time) Disposition Final User 05/23/2022 11:22:58 AM Attempt made - no message left Jake Bathe 05/23/2022 11:58:01 AM Call PCP within 24 Hours Yes Jearld Pies, RN, Lovena Le Final Disposition 05/23/2022 11:58:01 AM Call PCP within 24 Hours Yes Jearld Pies, RN, Apolonio Schneiders Disagree/Comply Comply Caller Understands Yes PreDisposition Did not know what to do Care Advice Given Per Guideline CALL PCP WITHIN 24 HOURS: * You need to discuss this with your doctor (or NP/PA) within the next 24 hours. * IF OFFICE WILL BE OPEN: Call the office when it opens tomorrow morning. CALL BACK IF: Referrals REFERRED TO PCP OFFICE

## 2022-05-27 ENCOUNTER — Encounter: Payer: Self-pay | Admitting: Internal Medicine

## 2022-05-27 NOTE — Telephone Encounter (Signed)
Patient requests to be called asap re: When can Patient resume taking his Diabetic medications again-last dose of Paxlovid is today (05/27/22)

## 2022-05-27 NOTE — Telephone Encounter (Signed)
Message sent thru MyChart 

## 2022-06-01 ENCOUNTER — Ambulatory Visit (INDEPENDENT_AMBULATORY_CARE_PROVIDER_SITE_OTHER): Payer: Medicare Other | Admitting: Family Medicine

## 2022-06-01 ENCOUNTER — Encounter: Payer: Self-pay | Admitting: Family Medicine

## 2022-06-01 VITALS — BP 138/70 | HR 94 | Temp 98.3°F | Ht 66.0 in | Wt 197.6 lb

## 2022-06-01 DIAGNOSIS — U071 COVID-19: Secondary | ICD-10-CM | POA: Diagnosis not present

## 2022-06-01 NOTE — Patient Instructions (Signed)
Please follow up as scheduled for your next visit with me: 06/10/2022   If you have any questions or concerns, please don't hesitate to send me a message via MyChart or call the office at [phone]. Thank you for visiting with [place] today! It's our pleasure caring for you.

## 2022-06-01 NOTE — Progress Notes (Signed)
Subjective  CC:  Chief Complaint  Patient presents with   COVID follow up    HPI: [name] is a 89 y.o. male who presents to the office today to address the problems listed above in the chief complaint. 89 year old with multiple comorbidities presents for follow-up.  Diagnosed with COVID March 23.  Started having symptoms on the 25th.  Completed a course of Paxlovid.  Fortunately, symptoms have all completely resolved.  Feels well.  Sugars are stable.  Assessment  1. COVID      Plan  COVID: Completed Paxlovid.  Symptoms have resolved.  He is now back on his Eliquis, statin, metformin  Follow up: Next week for diabetes follow-up 06/10/2022  No orders of the defined types were placed in this encounter.  No orders of the defined types were placed in this encounter.     I reviewed the patients updated PMH, FH, and SocHx.    Patient Active Problem List   Diagnosis Date Noted   Mild cognitive impairment 03/05/2021    Priority: High   Moderate nonproliferative diabetic retinopathy of left eye 06/18/2020    Priority: High   Stage 3b chronic kidney disease 05/26/2020    Priority: High   Aortic stenosis, moderate 05/22/2019    Priority: High   Chronic diastolic heart failure [id number]    Priority: High   Spinal stenosis of lumbar region 02/21/2018    Priority: High   Diabetic peripheral neuropathy associated with type 2 diabetes mellitus 05/05/2017    Priority: High   Coronary artery disease involving native coronary artery of native heart without angina pectoris 06/13/2016    Priority: High   CVA (cerebral vascular accident) 06/06/2016    Priority: High   Recurrent coronary arteriosclerosis after percutaneous transluminal coronary angioplasty 04/18/2015    Priority: High   CAD S/P PCI- Nov 2016     Priority: High   Chronic anticoagulation 02/06/2013    Priority: High   Chronic atrial fibrillation 07/30/2011    Priority: High   Hx of CABG x 2 1990      Priority: High   Hypertension associated with diabetes     Priority: High   Diabetes mellitus with diabetic nephropathy, with long-term current use of insulin     Priority: High   Combined hyperlipidemia associated with type 2 diabetes mellitus     Priority: High   Thoracic aortic aneurysm without rupture 05/26/2020    Priority: Medium    Degeneration of lumbar intervertebral disc 02/21/2018    Priority: Medium    Chronic vertigo 03/15/2014    Priority: Medium    Obesity (BMI 30.0-34.9) 01/31/2012    Priority: Medium    Enlarged prostate without lower urinary tract symptoms (luts) 01/04/2011    Priority: Medium    Gastro-esophageal reflux disease without esophagitis 12/07/2010    Priority: Medium    Epistaxis, recurrent 04/11/2015    Priority: Low   Osteoarthritis, knee 01/31/2012    Priority: Low   Allergic rhinitis 10/28/2011    Priority: Low   Hearing loss 06/08/2011    Priority: Low   Primary osteoarthritis of hand 06/08/2011    Priority: Low   Benign hematuria 08/27/2008    Priority: Low   COVID 05/21/2022   Benign neoplasm of cecum 05/05/2022   Hiatal hernia 05/05/2022   Iron deficiency anemia due to chronic blood loss 05/03/2022   Heme positive stool 05/03/2022   Bradycardia 05/02/2022   AKI (acute kidney injury) 05/02/2022   Lower GI bleeding  05/02/2022   Postural dizziness with presyncope 05/02/2022   Osteoarthritis of metacarpophalangeal (MCP) joint of left thumb 01/18/2022   Stable hemispheric central retinal vein occlusion (CRVO) of right eye 07/21/2020   Lower extremity edema 03/14/2019   No outpatient medications have been marked as taking for the 06/01/22 encounter (Office Visit) with [name], MD.    Allergies: Patient is allergic to septra [sulfamethoxazole-trimethoprim], cefadroxil, ciprofloxacin, and erythromycin. Family History: Patient family history includes Brain cancer in his father; Throat cancer in his brother. Social History:   Patient  reports that he quit smoking about 64 years ago. His smoking use included cigarettes. He has a 30.00 pack-year smoking history. He has never used smokeless tobacco. He reports that he does not drink alcohol and does not use drugs.  Review of Systems: Constitutional: Negative for fever malaise or anorexia Cardiovascular: negative for chest pain Respiratory: negative for SOB or persistent cough Gastrointestinal: negative for abdominal pain  Objective  Vitals: BP 138/70   Pulse 94   Temp 98.3 F (36.8 C)   Ht 5\' 6"  (1.676 m)   Wt 197 lb 9.6 oz (89.6 kg)   SpO2 97%   BMI 31.89 kg/m  General: no acute distress , A&Ox3 HEENT: PEERL, conjunctiva normal, neck is supple Cardiovascular:  RRR without murmur or gallop.  Respiratory:  Good breath sounds bilaterally, CTAB with normal respiratory effort Skin:  Warm, no rashes  Commons side effects, risks, benefits, and alternatives for medications and treatment plan prescribed today were discussed, and the patient expressed understanding of the given instructions. Patient is instructed to call or message via MyChart if he/she has any questions or concerns regarding our treatment plan. No barriers to understanding were identified. We discussed Red Flag symptoms and signs in detail. Patient expressed understanding regarding what to do in case of urgent or emergency type symptoms.  Medication list was reconciled, printed and provided to the patient in AVS. Patient instructions and summary information was reviewed with the patient as documented in the AVS. This note was prepared with assistance of Dragon voice recognition software. Occasional wrong-word or sound-a-like substitutions may have occurred due to the inherent limitations of voice recognition software

## 2022-06-01 NOTE — Telephone Encounter (Signed)
Pt has resumed all medications.

## 2022-06-03 ENCOUNTER — Other Ambulatory Visit: Payer: Self-pay | Admitting: Cardiology

## 2022-06-04 ENCOUNTER — Ambulatory Visit (INDEPENDENT_AMBULATORY_CARE_PROVIDER_SITE_OTHER): Payer: Medicare Other | Admitting: Podiatry

## 2022-06-04 ENCOUNTER — Encounter: Payer: Self-pay | Admitting: Podiatry

## 2022-06-04 DIAGNOSIS — B351 Tinea unguium: Secondary | ICD-10-CM

## 2022-06-04 DIAGNOSIS — M79674 Pain in right toe(s): Secondary | ICD-10-CM | POA: Diagnosis not present

## 2022-06-04 DIAGNOSIS — E1142 Type 2 diabetes mellitus with diabetic polyneuropathy: Secondary | ICD-10-CM

## 2022-06-04 NOTE — Progress Notes (Signed)
This patient returns to my office for at risk foot care.  This patient requires this care by a professional since this patient will be at risk due to having diabetic neuropathy and coagulation defect.  Patient is taking eliquiss.  Patient says toenails are painful walking and wearing his shoes.  This patient presents for at risk foot care today.  General Appearance  Alert, conversant and in no acute stress.  Vascular  Dorsalis pedis and posterior tibial  pulses are weakly  palpable  bilaterally.  Capillary return is within normal limits  bilaterally. Temperature is within normal limits  bilaterally.  Neurologic  Senn-Weinstein monofilament wire test within normal limits  bilaterally. Muscle power within normal limits bilaterally.  Nails Thick disfigured discolored nails with subungual debris  from hallux to fifth toes bilaterally. No evidence of bacterial infection or drainage bilaterally.  Orthopedic  No limitations of motion  feet .  No crepitus or effusions noted.  No bony pathology or digital deformities noted.  Skin  normotropic skin with no porokeratosis noted bilaterally.  No signs of infections or ulcers noted.     Onychomycosis  B/L  Consent was obtained for treatment procedures.   Debridement of nails with nail nipper followed by dremel tool. Filed with dremel without incident.    Return office visit    12 weeks                Told patient to return for periodic foot care and evaluation due to potential at risk complications.   Kathlyn Leachman DPM  

## 2022-06-09 ENCOUNTER — Encounter: Payer: Medicare Other | Admitting: Gastroenterology

## 2022-06-10 ENCOUNTER — Encounter: Payer: Self-pay | Admitting: Family Medicine

## 2022-06-10 ENCOUNTER — Ambulatory Visit (INDEPENDENT_AMBULATORY_CARE_PROVIDER_SITE_OTHER): Payer: Medicare Other | Admitting: Family Medicine

## 2022-06-10 VITALS — BP 134/64 | HR 91 | Temp 98.5°F | Ht 66.0 in | Wt 204.0 lb

## 2022-06-10 DIAGNOSIS — Z8719 Personal history of other diseases of the digestive system: Secondary | ICD-10-CM | POA: Insufficient documentation

## 2022-06-10 DIAGNOSIS — E1121 Type 2 diabetes mellitus with diabetic nephropathy: Secondary | ICD-10-CM

## 2022-06-10 DIAGNOSIS — I482 Chronic atrial fibrillation, unspecified: Secondary | ICD-10-CM | POA: Diagnosis not present

## 2022-06-10 DIAGNOSIS — M48062 Spinal stenosis, lumbar region with neurogenic claudication: Secondary | ICD-10-CM | POA: Diagnosis not present

## 2022-06-10 DIAGNOSIS — I152 Hypertension secondary to endocrine disorders: Secondary | ICD-10-CM | POA: Diagnosis not present

## 2022-06-10 DIAGNOSIS — E1159 Type 2 diabetes mellitus with other circulatory complications: Secondary | ICD-10-CM | POA: Diagnosis not present

## 2022-06-10 DIAGNOSIS — Z794 Long term (current) use of insulin: Secondary | ICD-10-CM

## 2022-06-10 DIAGNOSIS — M19042 Primary osteoarthritis, left hand: Secondary | ICD-10-CM | POA: Diagnosis not present

## 2022-06-10 LAB — POCT GLYCOSYLATED HEMOGLOBIN (HGB A1C): Hemoglobin A1C: 7.1 % — AB (ref 4.0–5.6)

## 2022-06-10 MED ORDER — TRAMADOL HCL 50 MG PO TABS: 50.0000 mg | ORAL_TABLET | Freq: Every day | ORAL | 2 refills | Status: DC | PRN

## 2022-06-10 NOTE — Progress Notes (Signed)
Was negative.  She   Subjective  CC:  Chief Complaint  Patient presents with   Diabetes    HPI: [name] is a 89 y.o. male who presents to the office today for follow up of diabetes and problems listed above in the chief complaint.  Diabetes follow up: His diabetic control is reported as Improved.  Fasting sugars show good control with average readings around 100-120.  Lowest documented was 73.  This happened only once.  He denies exertional CP or SOB or symptomatic hypoglycemia. He denies foot sores or paresthesias.  Complains of left osteoarthritic hand pain and right hip/back pain.  Exacerbated again by the change in weather's.  He is seeing orthopedics tomorrow for a steroid injection.  Pain is moderate to severe.  He has no pain medicines at home currently. Hypertension remains well-controlled.  No chest pain  Wt Readings from Last 3 Encounters:  06/10/22 204 lb (92.5 kg)  06/01/22 197 lb 9.6 oz (89.6 kg)  05/21/22 202 lb 6.4 oz (91.8 kg)    BP Readings from Last 3 Encounters:  06/10/22 134/64  06/01/22 138/70  05/21/22 125/72    Assessment  1. Type 2 diabetes mellitus with diabetic nephropathy, with long-term current use of insulin   2. Hypertension associated with diabetes   3. Chronic atrial fibrillation   4. Osteoarthritis of metacarpophalangeal (MCP) joint of left thumb   5. Spinal stenosis of lumbar region with neurogenic claudication      Plan  Diabetes is currently very well controlled.  Control is likely improved in part due to his hospitalization earlier this year.  Appetite was low.  However he has fully recovered.  Eating well now.  Fasting sugars remain well-controlled.  Continue current medications.  A1c may elevate slightly, his goal A1c is less than 9 given age and multiple comorbidities. Hypertension is well-controlled Chronic A-fib on Eliquis, stable Refill tramadol to treat osteoarthritic pain.  Follow-up with orthopedics tomorrow no red flag  symptoms identified today  Follow up: 3 months for recheck. Orders Placed This Encounter  Procedures   POCT HgB A1C   Meds ordered this encounter  Medications   traMADol (ULTRAM) 50 MG tablet    Sig: Take 1 tablet (50 mg total) by mouth daily as needed for moderate pain (hand pain).    Dispense:  60 tablet    Refill:  2      Immunization History  Administered Date(s) Administered   COVID-19, mRNA, vaccine(Comirnaty)12 years and older 03/13/2022   Fluad Quad(high Dose 65+) 11/02/2018, 11/30/2019, 11/07/2020, 11/11/2021   Influenza Split 11/14/2007, 01/01/2009, 12/23/2009, 11/29/2012, 10/31/2015   Influenza, High Dose Seasonal PF 11/02/2012, 12/12/2013, 11/15/2014, 11/24/2017, 01/17/2018   Influenza, Seasonal, Injecte, Preservative Fre 11/18/2015   Influenza,inj,Quad PF,6+ Mos 02/15/2017   Influenza,trivalent, recombinat, inj, PF 12/07/2010   Influenza-Unspecified 10/28/2011, 11/30/2014, 02/15/2017   PFIZER Comirnaty(Gray Top)Covid-19 Tri-Sucrose Vaccine 06/21/2020   PFIZER(Purple Top)SARS-COV-2 Vaccination 03/23/2019, 04/14/2019, 12/06/2019, 06/21/2020   PNEUMOCOCCAL CONJUGATE-20 08/06/2020   Pfizer Covid-19 Vaccine Bivalent Booster 79yrs & up 01/02/2021   Pneumococcal Conjugate-13 12/13/2013   Pneumococcal Polysaccharide-23 08/20/2008   Td 05/29/2002   Tdap 10/28/2011, 02/15/2017   Zoster Recombinat (Shingrix) 09/16/2016, 10/13/2016, 12/13/2016, 01/11/2017   Zoster, Live 08/20/2008    Diabetes Related Lab Review: Lab Results  Component Value Date   HGBA1C 7.1 (A) 06/10/2022   HGBA1C 8.7 (H) 03/11/2022   HGBA1C 7.7 (A) 11/11/2021    Lab Results  Component Value Date   MICROALBUR <0.7 03/14/2019   Lab  Results  Component Value Date   CREATININE 1.30 05/14/2022   BUN 26 (H) 05/14/2022   NA 141 05/14/2022   K 4.4 05/14/2022   CL 103 05/14/2022   CO2 29 05/14/2022   Lab Results  Component Value Date   CHOL 147 03/11/2022   CHOL 154 04/14/2021   CHOL 154  08/04/2020   Lab Results  Component Value Date   HDL 52.30 03/11/2022   HDL 54.70 04/14/2021   HDL 46.10 08/04/2020   Lab Results  Component Value Date   LDLCALC 80 03/11/2022   LDLCALC 75 04/14/2021   LDLCALC 90 08/04/2020   Lab Results  Component Value Date   TRIG 75.0 03/11/2022   TRIG 119.0 04/14/2021   TRIG 86.0 08/04/2020   Lab Results  Component Value Date   CHOLHDL 3 03/11/2022   CHOLHDL 3 04/14/2021   CHOLHDL 3 08/04/2020   No results found for: "LDLDIRECT" The ASCVD Risk score (Arnett DK, et al., 2019) failed to calculate for the following reasons:   The 2019 ASCVD risk score is only valid for ages 5740 to 7679   The patient has a prior MI or stroke diagnosis I have reviewed the PMH, Fam and Soc history. Patient Active Problem List   Diagnosis Date Noted   History of lower GI bleeding 06/10/2022    Priority: High    Heme positive stools in setting of worsening anemia: colonoscopy 2024 several polyps, likely chronic bleed from polyps. Tics; otherwise ok EGD at same time: nl esophagus Dr. Marina GoodellPerry    Mild cognitive impairment 03/05/2021    Priority: High    MMSE 26/30 and 6CIT 9 03/2021 AWV    Moderate nonproliferative diabetic retinopathy of left eye 06/18/2020    Priority: High   Stage 3b chronic kidney disease 05/26/2020    Priority: High   Aortic stenosis, moderate 05/22/2019    Priority: High    Stable echocardiogram 05/2020, Dr. SwazilandJordan    Chronic diastolic heart failure 03/14/2019    Priority: High   Spinal stenosis of lumbar region 02/21/2018    Priority: High   Diabetic peripheral neuropathy associated with type 2 diabetes mellitus 05/05/2017    Priority: High    No pain    Coronary artery disease involving native coronary artery of native heart without angina pectoris 06/13/2016    Priority: High    Overview:  Overview:  RCA DES placed Nov 2016, LIMA-LAD presumed patent based on Myoview but no visualized, SVG-Dx occluded  Last Assessment &  Plan:  RCA DES placed Nov 2016, LIMA-LAD presumed patent based on Myoview but no visualized, SVG-Dx occluded    CVA (cerebral vascular accident) 06/06/2016    Priority: High   Recurrent coronary arteriosclerosis after percutaneous transluminal coronary angioplasty 04/18/2015    Priority: High    Overview:  Overview:  RCA DES placed Nov 2016, LIMA-LAD presumed patent based on Myoview but no visualized, SVG-Dx occluded  Last Assessment & Plan:  RCA DES placed Nov 2016, LIMA-LAD presumed patent based on Myoview but no visualized, SVG-Dx occluded    CAD S/P PCI- Nov 2016     Priority: High    RCA DES placed Nov 2016, LIMA-LAD presumed patent based on Myoview but no visualized, SVG-Dx occluded    Chronic anticoagulation 02/06/2013    Priority: High    Overview:  Monitored by cardiology    Chronic atrial fibrillation 07/30/2011    Priority: High    CHADs VASc=5 for age, HTN, vascular disease, and DM  Hx of CABG x 2 1990     Priority: High    Status post CABG x2 in 1990 including an LIMA graft to the LAD, and a vein graft to the diagonal.     Hypertension associated with diabetes     Priority: High   Diabetes mellitus with diabetic nephropathy, with long-term current use of insulin     Priority: High    Neg urine microalbuminuria, not on ace.  On farxiga    Combined hyperlipidemia associated with type 2 diabetes mellitus     Priority: High   Thoracic aortic aneurysm without rupture 05/26/2020    Priority: Medium     Followed by cards. Stable by CT 2022    Degeneration of lumbar intervertebral disc 02/21/2018    Priority: Medium    Chronic vertigo 03/15/2014    Priority: Medium    Obesity (BMI 30.0-34.9) 01/31/2012    Priority: Medium    Enlarged prostate without lower urinary tract symptoms (luts) 01/04/2011    Priority: Medium     Overview:  Urology - avodart and tamuloscin    Gastro-esophageal reflux disease without esophagitis 12/07/2010    Priority: Medium     Hiatal hernia 05/05/2022    Priority: Low   Osteoarthritis of metacarpophalangeal (MCP) joint of left thumb 01/18/2022    Priority: Low   Stable hemispheric central retinal vein occlusion (CRVO) of right eye 07/21/2020    Priority: Low   Lower extremity edema 03/14/2019    Priority: Low   Epistaxis, recurrent 04/11/2015    Priority: Low   Osteoarthritis, knee 01/31/2012    Priority: Low   Allergic rhinitis 10/28/2011    Priority: Low   Hearing loss 06/08/2011    Priority: Low   Primary osteoarthritis of hand 06/08/2011    Priority: Low   Benign hematuria 08/27/2008    Priority: Low    Essential Hematuria - urology - Isabel Caprice w/u benign      Social History: Patient  reports that he quit smoking about 64 years ago. His smoking use included cigarettes. He has a 30.00 pack-year smoking history. He has never used smokeless tobacco. He reports that he does not drink alcohol and does not use drugs.  Review of Systems: Ophthalmic: negative for eye pain, loss of vision or double vision Cardiovascular: negative for chest pain Respiratory: negative for SOB or persistent cough Gastrointestinal: negative for abdominal pain Genitourinary: negative for dysuria or gross hematuria MSK: negative for foot lesions Neurologic: negative for weakness or gait disturbance  Objective  Vitals: BP 134/64   Pulse 91   Temp 98.5 F (36.9 C)   Ht 5\' 6"  (1.676 m)   Wt 204 lb (92.5 kg)   SpO2 95%   BMI 32.93 kg/m  General: well appearing, no acute distress  Psych:  Alert and oriented, normal mood and affect HEENT:  Normocephalic, atraumatic, moist mucous membranes, supple neck  Cardiovascular: Irregularly irregular, no edema  respiratory:  Good breath sounds bilaterally, CTAB with normal effort, no rales Left hand, osteoarthritic changes without erythema or warmth, antalgic gait  Diabetic education: ongoing education regarding chronic disease management for diabetes was given today. We continue  to reinforce the ABC's of diabetic management: A1c (<7 or 8 dependent upon patient), tight blood pressure control, and cholesterol management with goal LDL < 100 minimally. We discuss diet strategies, exercise recommendations, medication options and possible side effects. At each visit, we review recommended immunizations and preventive care recommendations for diabetics and stress that good diabetic control  can prevent other problems. See below for this patient's data.   Commons side effects, risks, benefits, and alternatives for medications and treatment plan prescribed today were discussed, and the patient expressed understanding of the given instructions. Patient is instructed to call or message via MyChart if he/she has any questions or concerns regarding our treatment plan. No barriers to understanding were identified. We discussed Red Flag symptoms and signs in detail. Patient expressed understanding regarding what to do in case of urgent or emergency type symptoms.  Medication list was reconciled, printed and provided to the patient in AVS. Patient instructions and summary information was reviewed with the patient as documented in the AVS. This note was prepared with assistance of Dragon voice recognition software. Occasional wrong-word or sound-a-like substitutions may have occurred due to the inherent limitations of voice recognition software

## 2022-06-10 NOTE — Patient Instructions (Signed)
Please return in 3 months for diabetes follow up   If you have any questions or concerns, please don't hesitate to send me a message via MyChart or call the office at [phone]. Thank you for visiting with us today! It's our pleasure caring for you.  

## 2022-06-11 ENCOUNTER — Ambulatory Visit (INDEPENDENT_AMBULATORY_CARE_PROVIDER_SITE_OTHER): Payer: Medicare Other | Admitting: Orthopaedic Surgery

## 2022-06-11 DIAGNOSIS — M7061 Trochanteric bursitis, right hip: Secondary | ICD-10-CM

## 2022-06-11 MED ORDER — METHYLPREDNISOLONE ACETATE 40 MG/ML IJ SUSP
40.0000 mg | INTRAMUSCULAR | Status: AC | PRN
  Administered 2022-06-11: 40 mg via INTRA_ARTICULAR

## 2022-06-11 MED ORDER — BUPIVACAINE HCL 0.5 % IJ SOLN
3.0000 mL | INTRAMUSCULAR | Status: AC | PRN
  Administered 2022-06-11: 3 mL via INTRA_ARTICULAR

## 2022-06-11 MED ORDER — LIDOCAINE HCL 1 % IJ SOLN
3.0000 mL | INTRAMUSCULAR | Status: AC | PRN
  Administered 2022-06-11: 3 mL

## 2022-06-11 NOTE — Progress Notes (Signed)
Office Visit Note   Patient: [name]           Date of Birth: 06-05-1928           MRN: [id number] Visit Date: 06/11/2022              Requested by: [name], MD [address] [place],  [place] [address] PCP: [name], MD   Assessment & Plan: Visit Diagnoses:  1. Trochanteric bursitis, right hip     Plan: Impression is recurrent right hip trochanteric bursitis.  Today, we reinjected this with cortisone as he has had great relief in the past.  He will follow-up with [place] as needed.  Follow-Up Instructions: Return if symptoms worsen or fail to improve.   Orders:  No orders of the defined types were placed in this encounter.  No orders of the defined types were placed in this encounter.     Procedures: Large Joint Inj: R greater trochanter on 06/11/2022 8:27 PM Indications: pain Details: 22 G needle  Arthrogram: No  Medications: 3 mL lidocaine 1 %; 3 mL bupivacaine 0.5 %; 40 mg methylPREDNISolone acetate 40 MG/ML Patient was prepped and draped in the usual sterile fashion.       Clinical Data: No additional findings.   Subjective: Chief Complaint  Patient presents with   Right Hip - Pain    HPI patient is a pleasant 89 year old gentleman who comes in today with recurrent right lateral hip pain.  History of trochanteric bursitis.  He was seen by [place] last May where this was injected with cortisone.  He had great relief until about 3 months ago.  His symptoms have returned.  All of his pain is to the lateral aspect and worse with walking as well as with wet weather.  He takes tramadol for his hands which may be helping the pain in his hip.  Review of Systems as detailed in HPI.  All others reviewed and are negative.   Objective: Vital Signs: There were no vitals taken for this visit.  Physical Exam well-developed and well-nourished gentleman in no acute distress.  Alert and oriented x 3.  Ortho Exam right hip exam reveals moderate  tenderness to the greater trochanter.  Painless hip flexion and logroll.  He is neurovascular intact distally.  Specialty Comments:  No specialty comments available.  Imaging: No new imaging   PMFS History: Patient Active Problem List   Diagnosis Date Noted   History of lower GI bleeding 06/10/2022   Hiatal hernia 05/05/2022   Osteoarthritis of metacarpophalangeal (MCP) joint of left thumb 01/18/2022   Mild cognitive impairment 03/05/2021   Stable hemispheric central retinal vein occlusion (CRVO) of right eye 07/21/2020   Moderate nonproliferative diabetic retinopathy of left eye 06/18/2020   Thoracic aortic aneurysm without rupture 05/26/2020   Stage 3b chronic kidney disease 05/26/2020   Aortic stenosis, moderate 05/22/2019   Chronic diastolic heart failure 03/14/2019   Lower extremity edema 03/14/2019   Spinal stenosis of lumbar region 02/21/2018   Degeneration of lumbar intervertebral disc 02/21/2018   Diabetic peripheral neuropathy associated with type 2 diabetes mellitus 05/05/2017   Coronary artery disease involving native coronary artery of native heart without angina pectoris 06/13/2016   CVA (cerebral vascular accident) 06/06/2016   Recurrent coronary arteriosclerosis after percutaneous transluminal coronary angioplasty 04/18/2015   Epistaxis, recurrent 04/11/2015   CAD S/P PCI- Nov 2016    Chronic vertigo 03/15/2014   Chronic anticoagulation 02/06/2013   Obesity (BMI 30.0-34.9)  01/31/2012   Osteoarthritis, knee 01/31/2012   Allergic rhinitis 10/28/2011   Chronic atrial fibrillation 07/30/2011   Hearing loss 06/08/2011   Primary osteoarthritis of hand 06/08/2011   Enlarged prostate without lower urinary tract symptoms (luts) 01/04/2011   Gastro-esophageal reflux disease without esophagitis 12/07/2010   Hx of CABG x 2 1990    Hypertension associated with diabetes    Diabetes mellitus with diabetic nephropathy, with long-term current use of insulin    Combined  hyperlipidemia associated with type 2 diabetes mellitus    Benign hematuria 08/27/2008   Past Medical History:  Diagnosis Date   Abdominal pain    Acute renal failure     resolved   Arthritis    "hands & legs" (11/'10/2014)   Ataxia    Atrial fibrillation    Bladder outlet obstruction    Bladder outlet obstruction    BPH (benign prostatic hyperplasia)    CAD (coronary artery disease)    a. CABG IN 1989. b. 01/08/2015 CTO of ost LAD, LIMA to LAD not visualized but assumed patent given myoview finding, occluded SVG to diagonal, 99% mid RCA tx w/ SYNERGY DES 3X28 mm   Constipation    Diabetes mellitus type 2, insulin dependent    Epistaxis, recurrent Feb 2017   GERD (gastroesophageal reflux disease)    HTN (hypertension)    Hx of bacterial pneumonia    Hyperlipemia    Kidney stones    Mild aortic stenosis    Mild cognitive impairment 03/05/2021   MMSE 26/30 and 6CIT 9 03/2021 AWV   Pneumonia 04/2019   Rib fractures    left rib fractures being treated with pain medications   Stented coronary artery Nov 2016   RCA DES   Urinary tract infection     Enterococcus    Family History  Problem Relation Age of Onset   Brain cancer Father    Throat cancer Brother    Liver disease Neg Hx    Colon cancer Neg Hx    Esophageal cancer Neg Hx     Past Surgical History:  Procedure Laterality Date   CARDIAC CATHETERIZATION  1989   CARDIAC CATHETERIZATION N/A 01/08/2015   Procedure: Left Heart Cath and Cors/Grafts Angiography;  Surgeon: Peter M Swaziland, MD;  Location: [facility] INVASIVE CV LAB;  Service: Cardiovascular;  Laterality: N/A;   CARDIAC CATHETERIZATION  01/08/2015   Procedure: Coronary Stent Intervention;  Surgeon: Peter M Swaziland, MD;  Location: [facility] INVASIVE CV LAB;  Service: Cardiovascular;;   CARPAL TUNNEL RELEASE Right 08/2010   CATARACT EXTRACTION W/ INTRAOCULAR LENS  IMPLANT, BILATERAL Bilateral    COLONOSCOPY WITH PROPOFOL N/A 05/05/2022   Procedure: COLONOSCOPY WITH PROPOFOL;  Surgeon:  Hilarie Fredrickson, MD;  Location: [facility] ENDOSCOPY;  Service: Gastroenterology;  Laterality: N/A;   CORONARY ANGIOPLASTY  01/08/15   RCA DES   CORONARY ARTERY BYPASS GRAFT  1989   "CABG X 2"   ESOPHAGOGASTRODUODENOSCOPY (EGD) WITH PROPOFOL N/A 05/05/2022   Procedure: ESOPHAGOGASTRODUODENOSCOPY (EGD) WITH PROPOFOL;  Surgeon: Hilarie Fredrickson, MD;  Location: [facility] ENDOS[facility];  Service: Gastroenterology;  Laterality: N/A;   IR THORACENTESIS ASP PLEURAL SPACE W/IMG GUIDE  05/14/2019   JOINT REPLACEMENT     POLYPECTOMY  05/05/2022   Procedure: POLYPECTOMY;  Surgeon: Hilarie Fredrickson, MD;  Location: [facility];  Service: Gastroenterology;;   TONSILLECTOMY     TOTAL HIP ARTHROPLASTY Right 2000   Social History   Occupational History   Occupation: Insurance underwriter   Occupation: retired  Tobacco  Use   Smoking status: Former    Packs/day: 3.00    Years: 10.00    Additional pack years: 0.00    Total pack years: 30.00    Types: Cigarettes    Quit date: 05/11/1958    Years since quitting: 64.1   Smokeless tobacco: Never  Vaping Use   Vaping Use: Never used  Substance and Sexual Activity   Alcohol use: No   Drug use: No   Sexual activity: Not Currently

## 2022-06-22 ENCOUNTER — Ambulatory Visit (INDEPENDENT_AMBULATORY_CARE_PROVIDER_SITE_OTHER): Payer: Medicare Other | Admitting: Family Medicine

## 2022-06-22 VITALS — BP 118/62 | HR 95 | Temp 98.6°F | Ht 66.0 in | Wt 191.4 lb

## 2022-06-22 DIAGNOSIS — E1121 Type 2 diabetes mellitus with diabetic nephropathy: Secondary | ICD-10-CM | POA: Diagnosis not present

## 2022-06-22 DIAGNOSIS — Z794 Long term (current) use of insulin: Secondary | ICD-10-CM | POA: Diagnosis not present

## 2022-06-22 DIAGNOSIS — E1159 Type 2 diabetes mellitus with other circulatory complications: Secondary | ICD-10-CM

## 2022-06-22 DIAGNOSIS — E1165 Type 2 diabetes mellitus with hyperglycemia: Secondary | ICD-10-CM | POA: Diagnosis not present

## 2022-06-22 DIAGNOSIS — R3589 Other polyuria: Secondary | ICD-10-CM

## 2022-06-22 DIAGNOSIS — Z7984 Long term (current) use of oral hypoglycemic drugs: Secondary | ICD-10-CM

## 2022-06-22 DIAGNOSIS — I152 Hypertension secondary to endocrine disorders: Secondary | ICD-10-CM | POA: Diagnosis not present

## 2022-06-22 LAB — URINALYSIS, ROUTINE W REFLEX MICROSCOPIC
Bilirubin Urine: NEGATIVE
Hgb urine dipstick: NEGATIVE
Ketones, ur: NEGATIVE
Leukocytes,Ua: NEGATIVE
Nitrite: NEGATIVE
RBC / HPF: NONE SEEN (ref 0–?)
Specific Gravity, Urine: 1.01 (ref 1.000–1.030)
Total Protein, Urine: NEGATIVE
Urine Glucose: >=1000 — AB
Urobilinogen, UA: 0.2 (ref 0.0–1.0)
pH: 6 (ref 5.0–8.0)

## 2022-06-22 LAB — COMPREHENSIVE METABOLIC PANEL
ALT: 13 U/L (ref 0–53)
AST: 13 U/L (ref 0–37)
Albumin: 4.2 g/dL (ref 3.5–5.2)
Alkaline Phosphatase: 57 U/L (ref 39–117)
BUN: 31 mg/dL — ABNORMAL HIGH (ref 6–23)
CO2: 27 mEq/L (ref 19–32)
Calcium: 9.4 mg/dL (ref 8.4–10.5)
Chloride: 102 mEq/L (ref 96–112)
Creatinine, Ser: 1.24 mg/dL (ref 0.40–1.50)
GFR: 50.62 mL/min — ABNORMAL LOW (ref >60.00)
Glucose, Bld: 286 mg/dL — ABNORMAL HIGH (ref 70–99)
Potassium: 4.5 mEq/L (ref 3.5–5.1)
Sodium: 138 mEq/L (ref 135–145)
Total Bilirubin: 0.7 mg/dL (ref 0.2–1.2)
Total Protein: 6.5 g/dL (ref 6.0–8.3)

## 2022-06-22 LAB — CBC WITH DIFFERENTIAL/PLATELET
Basophils Absolute: 0.1 10*3/uL (ref 0.0–0.1)
Basophils Relative: 1 % (ref 0.0–3.0)
Eosinophils Absolute: 0.3 10*3/uL (ref 0.0–0.7)
Eosinophils Relative: 1.9 % (ref 0.0–5.0)
HCT: 36.9 % — ABNORMAL LOW (ref 39.0–52.0)
Hemoglobin: 11.6 g/dL — ABNORMAL LOW (ref 13.0–17.0)
Lymphocytes Relative: 16 % (ref 12.0–46.0)
Lymphs Abs: 2.1 10*3/uL (ref 0.7–4.0)
MCHC: 31.4 g/dL (ref 30.0–36.0)
MCV: 71.5 fl — ABNORMAL LOW (ref 78.0–100.0)
Monocytes Absolute: 1 10*3/uL (ref 0.1–1.0)
Monocytes Relative: 7.4 % (ref 3.0–12.0)
Neutro Abs: 9.6 10*3/uL — ABNORMAL HIGH (ref 1.4–7.7)
Neutrophils Relative %: 73.7 % (ref 43.0–77.0)
Platelets: 301 10*3/uL (ref 150.0–400.0)
RBC: 5.16 Mil/uL (ref 4.22–5.81)
RDW: 20.1 % — ABNORMAL HIGH (ref 11.5–15.5)
WBC: 13 10*3/uL — ABNORMAL HIGH (ref 4.0–10.5)

## 2022-06-22 NOTE — Progress Notes (Signed)
Subjective  CC:  Chief Complaint  Patient presents with   Diabetes    Pt staed that he has been having some high BS reading     HPI: [name] is a 89 y.o. male who presents to the office today for follow up of diabetes and problems listed above in the chief complaint.  Diabetes with new hyperglycemia: 89 year old with type 2 diabetes presents due to having fasting sugars that are very elevated over the last 5 days.  He typically runs in the low 100s.  Now running 1 50-200.  He feels he has not changed his diet at all, in fact he is eating less.  He has lost weight.  Appetite is down.  He was having right hip pain and had a steroid injection on April 12.  He has never had hyperglycemia after an injection in the past.  He is quite anxious about the symptoms.  He admits to polyuria, polydipsia.  His right hip pain initially responded to the steroid injection but is now active again.  Otherwise he feels well.  He admits to intermittent orthostatic dizziness.  No chest pain, palpitations, shortness of breath, lower extremity edema.  He continues on Lantus 40 units nightly, Jardiance 25 daily and metformin.  He denies dysuria.  No fevers or chills.  Wt Readings from Last 3 Encounters:  06/22/22 191 lb 6.4 oz (86.8 kg)  06/10/22 204 lb (92.5 kg)  06/01/22 197 lb 9.6 oz (89.6 kg)    BP Readings from Last 3 Encounters:  06/22/22 118/62  06/10/22 134/64  06/01/22 138/70    Assessment  1. Type 2 diabetes mellitus with diabetic nephropathy, with long-term current use of insulin   2. Type 2 diabetes mellitus with hyperglycemia, with long-term current use of insulin   3. Hypertension associated with diabetes   4. Polyuria      Plan  Diabetes with hyperglycemia: I suspect this is related to the steroid injection but need to rule out other causes.  Tried to reassure him.  Will check lab work, urinalysis with culture and continue checking fasting sugars.  His postprandial sugar in the office  today was 278.  He has had elevated postprandials in the past but again, fastings typically are well-controlled.  Will rule out infection.  He clinically is very stable today.  I recommend holding furosemide, drinking plenty of water.  Blood pressures are stable but holding furosemide should help.  He is having some orthostatic symptoms.  He is on metoprolol which is controlling his rate, A-fib .  He had been on Cardizem but this was stopped due to low blood pressures.  No further adjustments in blood pressure medications at this time.  He will follow-up with me in 1 week for reevaluation. I spent a total of 52 minutes for this patient encounter. Time spent included preparation, face-to-face counseling with the patient and coordination of care, review of chart and records, and documentation of the encounter.  Follow up: 1 week Orders Placed This Encounter  Procedures   Urine Culture   CBC with Differential/Platelet   Comprehensive metabolic panel   Urinalysis, Routine w reflex microscopic   No orders of the defined types were placed in this encounter.     Immunization History  Administered Date(s) Administered   COVID-19, mRNA, vaccine(Comirnaty)12 years and older 03/13/2022   Fluad Quad(high Dose 65+) 11/02/2018, 11/30/2019, 11/07/2020, 11/11/2021   Influenza Split 11/14/2007, 01/01/2009, 12/23/2009, 11/29/2012, 10/31/2015   Influenza, High Dose Seasonal PF 11/02/2012, 12/12/2013,  11/15/2014, 11/24/2017, 01/17/2018   Influenza, Seasonal, Injecte, Preservative Fre 11/18/2015   Influenza,inj,Quad PF,6+ Mos 02/15/2017   Influenza,trivalent, recombinat, inj, PF 12/07/2010   Influenza-Unspecified 10/28/2011, 11/30/2014, 02/15/2017   PFIZER Comirnaty(Gray Top)Covid-19 Tri-Sucrose Vaccine 06/21/2020   PFIZER(Purple Top)SARS-COV-2 Vaccination 03/23/2019, 04/14/2019, 12/06/2019, 06/21/2020   PNEUMOCOCCAL CONJUGATE-20 08/06/2020   Pfizer Covid-19 Vaccine Bivalent Booster 38yrs & up 01/02/2021    Pneumococcal Conjugate-13 12/13/2013   Pneumococcal Polysaccharide-23 08/20/2008   Td 05/29/2002   Tdap 10/28/2011, 02/15/2017   Zoster Recombinat (Shingrix) 09/16/2016, 10/13/2016, 12/13/2016, 01/11/2017   Zoster, Live 08/20/2008    Diabetes Related Lab Review: Lab Results  Component Value Date   HGBA1C 7.1 (A) 06/10/2022   HGBA1C 8.7 (H) 03/11/2022   HGBA1C 7.7 (A) 11/11/2021    Lab Results  Component Value Date   MICROALBUR <0.7 03/14/2019   Lab Results  Component Value Date   CREATININE 1.30 05/14/2022   BUN 26 (H) 05/14/2022   NA 141 05/14/2022   K 4.4 05/14/2022   CL 103 05/14/2022   CO2 29 05/14/2022   Lab Results  Component Value Date   CHOL 147 03/11/2022   CHOL 154 04/14/2021   CHOL 154 08/04/2020   Lab Results  Component Value Date   HDL 52.30 03/11/2022   HDL 54.70 04/14/2021   HDL 46.10 08/04/2020   Lab Results  Component Value Date   LDLCALC 80 03/11/2022   LDLCALC 75 04/14/2021   LDLCALC 90 08/04/2020   Lab Results  Component Value Date   TRIG 75.0 03/11/2022   TRIG 119.0 04/14/2021   TRIG 86.0 08/04/2020   Lab Results  Component Value Date   CHOLHDL 3 03/11/2022   CHOLHDL 3 04/14/2021   CHOLHDL 3 08/04/2020   No results found for: "LDLDIRECT" The ASCVD Risk score (Arnett DK, et al., 2019) failed to calculate for the following reasons:   The 2019 ASCVD risk score is only valid for ages 30 to 66   The patient has a prior MI or stroke diagnosis I have reviewed the PMH, Fam and Soc history. Patient Active Problem List   Diagnosis Date Noted   History of lower GI bleeding 06/10/2022    Priority: High    Heme positive stools in setting of worsening anemia: colonoscopy 2024 several polyps, likely chronic bleed from polyps. Tics; otherwise ok EGD at same time: nl esophagus Dr. Marina Goodell    Mild cognitive impairment 03/05/2021    Priority: High    MMSE 26/30 and 6CIT 9 03/2021 AWV    Moderate nonproliferative diabetic retinopathy of left  eye 06/18/2020    Priority: High   Stage 3b chronic kidney disease 05/26/2020    Priority: High   Aortic stenosis, moderate 05/22/2019    Priority: High    Stable echocardiogram 05/2020, Dr. [place]    Chronic diastolic heart failure 03/14/2019    Priority: High   Spinal stenosis of lumbar region 02/21/2018    Priority: High   Diabetic peripheral neuropathy associated with type 2 diabetes mellitus 05/05/2017    Priority: High    No pain    Coronary artery disease involving native coronary artery of native heart without angina pectoris 06/13/2016    Priority: High    Overview:  Overview:  RCA DES placed Nov 2016, LIMA-LAD presumed patent based on Myoview but no visualized, SVG-Dx occluded  Last Assessment & Plan:  RCA DES placed Nov 2016, LIMA-LAD presumed patent based on Myoview but no visualized, SVG-Dx occluded    CVA (cerebral vascular accident)  06/06/2016    Priority: High   Recurrent coronary arteriosclerosis after percutaneous transluminal coronary angioplasty 04/18/2015    Priority: High    Overview:  Overview:  RCA DES placed Nov 2016, LIMA-LAD presumed patent based on Myoview but no visualized, SVG-Dx occluded  Last Assessment & Plan:  RCA DES placed Nov 2016, LIMA-LAD presumed patent based on Myoview but no visualized, SVG-Dx occluded    CAD S/P PCI- Nov 2016     Priority: High    RCA DES placed Nov 2016, LIMA-LAD presumed patent based on Myoview but no visualized, SVG-Dx occluded    Chronic anticoagulation 02/06/2013    Priority: High    Overview:  Monitored by cardiology    Chronic atrial fibrillation 07/30/2011    Priority: High    CHADs VASc=5 for age, HTN, vascular disease, and DM    Hx of CABG x 2 1990     Priority: High    Status post CABG x2 in 1990 including an LIMA graft to the LAD, and a vein graft to the diagonal.     Hypertension associated with diabetes     Priority: High   Diabetes mellitus with diabetic nephropathy, with long-term current  use of insulin     Priority: High    Neg urine microalbuminuria, not on ace.  On farxiga    Combined hyperlipidemia associated with type 2 diabetes mellitus     Priority: High   Thoracic aortic aneurysm without rupture 05/26/2020    Priority: Medium     Followed by cards. Stable by CT 2022    Degeneration of lumbar intervertebral disc 02/21/2018    Priority: Medium    Chronic vertigo 03/15/2014    Priority: Medium    Obesity (BMI 30.0-34.9) 01/31/2012    Priority: Medium    Enlarged prostate without lower urinary tract symptoms (luts) 01/04/2011    Priority: Medium     Overview:  Urology - avodart and tamuloscin    Gastro-esophageal reflux disease without esophagitis 12/07/2010    Priority: Medium    Hiatal hernia 05/05/2022    Priority: Low   Osteoarthritis of metacarpophalangeal (MCP) joint of left thumb 01/18/2022    Priority: Low   Stable hemispheric central retinal vein occlusion (CRVO) of right eye 07/21/2020    Priority: Low   Lower extremity edema 03/14/2019    Priority: Low   Epistaxis, recurrent 04/11/2015    Priority: Low   Osteoarthritis, knee 01/31/2012    Priority: Low   Allergic rhinitis 10/28/2011    Priority: Low   Hearing loss 06/08/2011    Priority: Low   Primary osteoarthritis of hand 06/08/2011    Priority: Low   Benign hematuria 08/27/2008    Priority: Low    Essential Hematuria - urology - Isabel Caprice w/u benign      Social History: Patient  reports that he quit smoking about 64 years ago. His smoking use included cigarettes. He has a 30.00 pack-year smoking history. He has never used smokeless tobacco. He reports that he does not drink alcohol and does not use drugs.  Review of Systems: Ophthalmic: negative for eye pain, loss of vision or double vision Cardiovascular: negative for chest pain Respiratory: negative for SOB or persistent cough Gastrointestinal: negative for abdominal pain Genitourinary: negative for dysuria or gross  hematuria MSK: negative for foot lesions Neurologic: negative for weakness or gait disturbance  Objective  Vitals: BP 118/62 Comment: repeat lying and sitting, cla  Pulse 95   Temp 98.6 F (37 C)  Ht 5\' 6"  (1.676 m)   Wt 191 lb 6.4 oz (86.8 kg)   SpO2 98%   BMI 30.89 kg/m  General: well appearing, no acute distress  Psych:  Alert and oriented, anxious mood and affect HEENT:  Normocephalic, atraumatic, moist mucous membranes, supple neck  Cardiovascular: Irregularly irregular, no edema  respiratory:  Good breath sounds bilaterally, CTAB with normal effort, no rales Gastrointestinal: normal BS, soft, nontender Skin:  Warm, no rashes     Diabetic education: ongoing education regarding chronic disease management for diabetes was given today. We continue to reinforce the ABC's of diabetic management: A1c (<7 or 8 dependent upon patient), tight blood pressure control, and cholesterol management with goal LDL < 100 minimally. We discuss diet strategies, exercise recommendations, medication options and possible side effects. At each visit, we review recommended immunizations and preventive care recommendations for diabetics and stress that good diabetic control can prevent other problems. See below for this patient's data.   Commons side effects, risks, benefits, and alternatives for medications and treatment plan prescribed today were discussed, and the patient expressed understanding of the given instructions. Patient is instructed to call or message via MyChart if he/she has any questions or concerns regarding our treatment plan. No barriers to understanding were identified. We discussed Red Flag symptoms and signs in detail. Patient expressed understanding regarding what to do in case of urgent or emergency type symptoms.  Medication list was reconciled, printed and provided to the patient in AVS. Patient instructions and summary information was reviewed with the patient as documented in  the AVS. This note was prepared with assistance of Dragon voice recognition software. Occasional wrong-word or sound-a-like substitutions may have occurred due to the inherent limitations of voice recognition software

## 2022-06-22 NOTE — Patient Instructions (Signed)
Please return in 1 week for recheck. Bring your sugar log.   Please do not take the furosemide for the next week.  Drink plenty of water.   If you have any questions or concerns, please don't hesitate to send me a message via MyChart or call the office at [phone]. Thank you for visiting with [place] today! It's our pleasure caring for you.

## 2022-06-23 LAB — URINE CULTURE
MICRO NUMBER:: 14862160
SPECIMEN QUALITY:: ADEQUATE

## 2022-06-28 ENCOUNTER — Ambulatory Visit: Payer: Medicare Other | Admitting: Gastroenterology

## 2022-06-29 ENCOUNTER — Encounter: Payer: Self-pay | Admitting: Family Medicine

## 2022-06-29 ENCOUNTER — Ambulatory Visit (INDEPENDENT_AMBULATORY_CARE_PROVIDER_SITE_OTHER): Payer: Medicare Other | Admitting: Family Medicine

## 2022-06-29 VITALS — BP 138/84 | HR 93 | Temp 98.7°F | Ht 66.0 in | Wt 193.8 lb

## 2022-06-29 DIAGNOSIS — E1165 Type 2 diabetes mellitus with hyperglycemia: Secondary | ICD-10-CM | POA: Diagnosis not present

## 2022-06-29 DIAGNOSIS — R739 Hyperglycemia, unspecified: Secondary | ICD-10-CM

## 2022-06-29 DIAGNOSIS — T380X5A Adverse effect of glucocorticoids and synthetic analogues, initial encounter: Secondary | ICD-10-CM

## 2022-06-29 DIAGNOSIS — E1121 Type 2 diabetes mellitus with diabetic nephropathy: Secondary | ICD-10-CM | POA: Diagnosis not present

## 2022-06-29 DIAGNOSIS — Z794 Long term (current) use of insulin: Secondary | ICD-10-CM | POA: Diagnosis not present

## 2022-06-29 NOTE — Progress Notes (Signed)
Subjective  CC:  Chief Complaint  Patient presents with   Diabetes    HPI: [name] is a 89 y.o. male who presents to the office today to address the problems listed above in the chief complaint. Follow-up hyperglycemia: Fortunately, patient's blood sugars have returned to his normal baseline.  Fastings ranging in the low 100s.  He feels well.  Reviewed lab work.  His hyperglycemia and leukocytosis was likely steroid induced.  He understands and agrees. No visits with results within 1 Day(s) from this visit.  Latest known visit with results is:  Office Visit on 06/22/2022  Component Date Value Ref Range Status   WBC 06/22/2022 13.0 (H)  4.0 - 10.5 K/uL Final   RBC 06/22/2022 5.16  4.22 - 5.81 Mil/uL Final   Hemoglobin 06/22/2022 11.6 (L)  13.0 - 17.0 g/dL Final   HCT 16/11/9602 36.9 (L)  39.0 - 52.0 % Final   MCV 06/22/2022 71.5 (L)  78.0 - 100.0 fl Final   MCHC 06/22/2022 31.4  30.0 - 36.0 g/dL Final   RDW 54/10/8117 20.1 (H)  11.5 - 15.5 % Final   Platelets 06/22/2022 301.0  150.0 - 400.0 K/uL Final   Neutrophils Relative % 06/22/2022 73.7  43.0 - 77.0 % Final   Lymphocytes Relative 06/22/2022 16.0  12.0 - 46.0 % Final   Monocytes Relative 06/22/2022 7.4  3.0 - 12.0 % Final   Eosinophils Relative 06/22/2022 1.9  0.0 - 5.0 % Final   Basophils Relative 06/22/2022 1.0  0.0 - 3.0 % Final   Neutro Abs 06/22/2022 9.6 (H)  1.4 - 7.7 K/uL Final   Lymphs Abs 06/22/2022 2.1  0.7 - 4.0 K/uL Final   Monocytes Absolute 06/22/2022 1.0  0.1 - 1.0 K/uL Final   Eosinophils Absolute 06/22/2022 0.3  0.0 - 0.7 K/uL Final   Basophils Absolute 06/22/2022 0.1  0.0 - 0.1 K/uL Final   Sodium 06/22/2022 138  135 - 145 mEq/L Final   Potassium 06/22/2022 4.5  3.5 - 5.1 mEq/L Final   Chloride 06/22/2022 102  96 - 112 mEq/L Final   CO2 06/22/2022 27  19 - 32 mEq/L Final   Glucose, Bld 06/22/2022 286 (H)  70 - 99 mg/dL Final   BUN 14/78/2956 31 (H)  6 - 23 mg/dL Final   Creatinine, Ser 06/22/2022  1.24  0.40 - 1.50 mg/dL Final   Total Bilirubin 06/22/2022 0.7  0.2 - 1.2 mg/dL Final   Alkaline Phosphatase 06/22/2022 57  39 - 117 U/L Final   AST 06/22/2022 13  0 - 37 U/L Final   ALT 06/22/2022 13  0 - 53 U/L Final   Total Protein 06/22/2022 6.5  6.0 - 8.3 g/dL Final   Albumin 21/30/8657 4.2  3.5 - 5.2 g/dL Final   GFR 84/69/6295 50.62 (L)  >60.00 mL/min Final   Calcium 06/22/2022 9.4  8.4 - 10.5 mg/dL Final   MICRO NUMBER: 28/41/3244 [id number]   Final   SPECIMEN QUALITY: 06/22/2022 Adequate   Final   Sample Source 06/22/2022 URINE   Final   STATUS: 06/22/2022 FINAL   Final   Result: 06/22/2022    Final                   Value:Mixed genital flora isolated. These superficial bacteria are not indicative of a urinary tract infection. No further organism identification is warranted on this specimen. If clinically indicated, recollect clean-catch, mid-stream urine and transfer  immediately to Urine Culture Transport Tube.  Color, Urine 06/22/2022 YELLOW  Yellow;Lt. Yellow;Straw;Dark Yellow;Amber;Green;Red;Brown Final   APPearance 06/22/2022 CLEAR  Clear;Turbid;Slightly Cloudy;Cloudy Final   Specific Gravity, Urine 06/22/2022 1.010  1.000 - 1.030 Final   pH 06/22/2022 6.0  5.0 - 8.0 Final   Total Protein, Urine 06/22/2022 NEGATIVE  Negative Final   Urine Glucose 06/22/2022 >=1000 (A)  Negative Final   Ketones, ur 06/22/2022 NEGATIVE  Negative Final   Bilirubin Urine 06/22/2022 NEGATIVE  Negative Final   Hgb urine dipstick 06/22/2022 NEGATIVE  Negative Final   Urobilinogen, UA 06/22/2022 0.2  0.0 - 1.0 Final   Leukocytes,Ua 06/22/2022 NEGATIVE  Negative Final   Nitrite 06/22/2022 NEGATIVE  Negative Final   WBC, UA 06/22/2022 0-2/hpf  0-2/hpf Final   RBC / HPF 06/22/2022 none seen  0-2/hpf Final   Yeast, UA 06/22/2022 Presence of (A)  None Final    Assessment  1. Steroid-induced hyperglycemia   2. Type 2 diabetes mellitus with diabetic nephropathy, with long-term current use of  insulin (HCC)      Plan  Hyperglycemia: Normalized now.  Reassured.  Likely due to steroid injection of the hip.  Educated.  Nothing further needed at this time.  Follow up: As scheduled   No orders of the defined types were placed in this encounter.  No orders of the defined types were placed in this encounter.     I reviewed the patients updated PMH, FH, and SocHx.    Patient Active Problem List   Diagnosis Date Noted   History of lower GI bleeding 06/10/2022    Priority: High   Mild cognitive impairment 03/05/2021    Priority: High   Moderate nonproliferative diabetic retinopathy of left eye (HCC) 06/18/2020    Priority: High   Stage 3b chronic kidney disease (HCC) 05/26/2020    Priority: High   Aortic stenosis, moderate 05/22/2019    Priority: High   Chronic diastolic heart failure (HCC) 03/14/2019    Priority: High   Spinal stenosis of lumbar region 02/21/2018    Priority: High   Diabetic peripheral neuropathy associated with type 2 diabetes mellitus (HCC) 05/05/2017    Priority: High   Coronary artery disease involving native coronary artery of native heart without angina pectoris 06/13/2016    Priority: High   CVA (cerebral vascular accident) (HCC) 06/06/2016    Priority: High   Recurrent coronary arteriosclerosis after percutaneous transluminal coronary angioplasty 04/18/2015    Priority: High   CAD S/P PCI- Nov 2016     Priority: High   Chronic anticoagulation 02/06/2013    Priority: High   Chronic atrial fibrillation (HCC) 07/30/2011    Priority: High   Hx of CABG x 2 1990     Priority: High   Hypertension associated with diabetes (HCC)     Priority: High   Diabetes mellitus with diabetic nephropathy, with long-term current use of insulin (HCC)     Priority: High   Combined hyperlipidemia associated with type 2 diabetes mellitus (HCC)     Priority: High   Thoracic aortic aneurysm without rupture (HCC) 05/26/2020    Priority: Medium    Degeneration of  lumbar intervertebral disc 02/21/2018    Priority: Medium    Chronic vertigo 03/15/2014    Priority: Medium    Obesity (BMI 30.0-34.9) 01/31/2012    Priority: Medium    Enlarged prostate without lower urinary tract symptoms (luts) 01/04/2011    Priority: Medium    Gastro-esophageal reflux disease without esophagitis 12/07/2010    Priority: Medium  Hiatal hernia 05/05/2022    Priority: Low   Osteoarthritis of metacarpophalangeal (MCP) joint of left thumb 01/18/2022    Priority: Low   Stable hemispheric central retinal vein occlusion (CRVO) of right eye 07/21/2020    Priority: Low   Lower extremity edema 03/14/2019    Priority: Low   Epistaxis, recurrent 04/11/2015    Priority: Low   Osteoarthritis, knee 01/31/2012    Priority: Low   Allergic rhinitis 10/28/2011    Priority: Low   Hearing loss 06/08/2011    Priority: Low   Primary osteoarthritis of hand 06/08/2011    Priority: Low   Benign hematuria 08/27/2008    Priority: Low   Current Meds  Medication Sig   apixaban (ELIQUIS) 5 MG TABS tablet Take 1 tablet (5 mg total) by mouth 2 (two) times daily.   atorvastatin (LIPITOR) 40 MG tablet Take 1 tablet (40 mg total) by mouth daily at 6 PM. (Patient taking differently: Take 40 mg by mouth at bedtime.)   AVODART 0.5 MG capsule Take 0.5 mg by mouth every other day.   Cholecalciferol (VITAMIN D3 PO) Take 50 mcg by mouth daily.   empagliflozin (JARDIANCE) 25 MG TABS tablet Take 1 tablet (25 mg total) by mouth daily before breakfast.   gabapentin (NEURONTIN) 300 MG capsule Take 2 capsules (600 mg total) by mouth 3 (three) times daily.   insulin glargine (LANTUS) 100 UNIT/ML injection Inject 40 Units into the skin every morning.   iron polysaccharides (NIFEREX) 150 MG capsule Take 1 capsule (150 mg total) by mouth daily.   metFORMIN (GLUCOPHAGE) 500 MG tablet Take 500 mg by mouth 2 (two) times daily with a meal.   ondansetron (ZOFRAN) 4 MG tablet Take 1 tablet (4 mg total) by mouth  every 6 (six) hours as needed for nausea.   pantoprazole (PROTONIX) 40 MG tablet TAKE 1 TABLET BY MOUTH DAILY AT NOON   tamsulosin (FLOMAX) 0.4 MG CAPS capsule Take 0.4 mg by mouth every other day.   traMADol (ULTRAM) 50 MG tablet Take 1 tablet (50 mg total) by mouth daily as needed for moderate pain (hand pain).   TYLENOL 500 MG tablet Take 500-1,000 mg by mouth every 6 (six) hours as needed for mild pain or headache.    Allergies: Patient is allergic to septra [sulfamethoxazole-trimethoprim], cefadroxil, ciprofloxacin, and erythromycin. Family History: Patient family history includes Brain cancer in his father; Throat cancer in his brother. Social History:  Patient  reports that he quit smoking about 64 years ago. His smoking use included cigarettes. He has a 30.00 pack-year smoking history. He has never used smokeless tobacco. He reports that he does not drink alcohol and does not use drugs.  Review of Systems: Constitutional: Negative for fever malaise or anorexia Cardiovascular: negative for chest pain Respiratory: negative for SOB or persistent cough Gastrointestinal: negative for abdominal pain  Objective  Vitals: BP 138/84   Pulse 93   Temp 98.7 F (37.1 C)   Ht 5\' 6"  (1.676 m)   Wt 193 lb 12.8 oz (87.9 kg)   SpO2 97%   BMI 31.28 kg/m  General: no acute distress , A&Ox3   Commons side effects, risks, benefits, and alternatives for medications and treatment plan prescribed today were discussed, and the patient expressed understanding of the given instructions. Patient is instructed to call or message via MyChart if he/she has any questions or concerns regarding our treatment plan. No barriers to understanding were identified. We discussed Red Flag symptoms and signs in  detail. Patient expressed understanding regarding what to do in case of urgent or emergency type symptoms.  Medication list was reconciled, printed and provided to the patient in AVS. Patient instructions and  summary information was reviewed with the patient as documented in the AVS. This note was prepared with assistance of Dragon voice recognition software. Occasional wrong-word or sound-a-like substitutions may have occurred due to the inherent limitations of voice recognition software

## 2022-07-03 NOTE — Progress Notes (Unsigned)
Cardiology Office Note   Date:  10/22/2021   ID:  [name], DOB 01-30-1929, MRN [id number]  PCP:  Jeffrey Ora, MD  Cardiologist:  Jeffrey Taves [place], MD EP: None  Chief Complaint  Patient presents with   Congestive Heart Failure       History of Present Illness: [name] is a 89 y.o. male with PMH of CAD s/p CABG in 1990 with subsequent DES to RCA in 2006, permanent atrial fibrillation, chronic diastolic CHF, aortic stenosis, HTN, HLD, carotid artery disease, DM type 2, CVA, who presents for  follow-up.   He was  admitted to the hospital 05/13/19-05/18/19 for acute respiratory failure 2/2 PNA and pleural effusion, managed with IV antibiotics and a thoracentesis. Cardiology followed that admission for assistance with atrial fibrillation with RVR and he was discharged home on diltiazem 360mg  and metoprolol succinate 100mg  BID for rate control, apixaban 5mg  BID for stroke ppx, and lasix 40mg  daily for CHF. Echo 05/14/19 showed EF 60-65%, moderate concentric LVH, indeterminate LV diastolic function, mild AI, moderate AS, and ascending aortic aneurysm of 4.8cm (up from 4.3cm 09/2018).   He was admitted 8/6-10/07/21 with sepsis due to PNA. Managed with antibiotics. He was readmitted on 8/12-8/14 with edema and some CHF. He was continued on therapy for PNA. Swallowing evaluation was negative for aspiration. BNP 207.5. Echocardiogram with EF 55 to 60%, no regional wall motion abnormality, increasing right ventricular pressure and volume overload.  No significant change from previous. Responded to diuretics. DC on lasix 40 mg bid.   In October he was treated for acute bronchitis.  He presented to the ED on 05/01/2022 with shortness of breath, dizziness, and presyncope. Prior to this he was following with GI for ongoing issues with GI bleeding and progressive blood loss anemia.  He was scheduled for outpatient colonoscopy.  Upon arrival to the ED he was hypotensive and bradycardic (HR in the  40s-60s). Cardiology was consulted in the setting of bradycardia.  Toprol and diltiazem were held with improvement in HR.  Eliquis was held in the setting of GI bleed.  He tolerated reintroduction of low-dose metoprolol.  Repeat echocardiogram showed progressive aortic stenosis, moderate to severe. Endoscopy showed no significant bleeding.  He was noted to have moderate hiatal hernia.  He did have a number of colon polyps.  GI recommended resuming anticoagulation with Sunday after hospital discharge.  He was discharged home in stable condition on 05/06/2022. He was seen in our office on March 22 and was doing well. He did have Covid 19 but this was mild.    On follow up today he is doing well. Denies any bleeding, chest pain, dyspnea or palpitations. Notes some swelling occasionally. Major complaint is of right hip/leg pain.    Past Medical History:  Diagnosis Date   Abdominal pain    Acute renal failure (HCC)     resolved   Arthritis    "hands & legs" (11/'10/2014)   Ataxia    Atrial fibrillation (HCC)    Bladder outlet obstruction    Bladder outlet obstruction    BPH (benign prostatic hyperplasia)    CAD (coronary artery disease)    a. CABG IN 1989. b. 01/08/2015 CTO of ost LAD, LIMA to LAD not visualized but assumed patent given myoview finding, occluded SVG to diagonal, 99% mid RCA tx w/ SYNERGY DES 3X28 mm   Constipation    Diabetes mellitus type 2, insulin dependent (HCC)    Epistaxis, recurrent Feb 2017  GERD (gastroesophageal reflux disease)    HTN (hypertension)    Hx of bacterial pneumonia    Hyperlipemia    Kidney stones    Mild aortic stenosis    Mild cognitive impairment 03/05/2021   MMSE 26/30 and 6CIT 9 03/2021 AWV   Pneumonia 04/2019   Rib fractures    left rib fractures being treated with pain medications   Stented coronary artery Nov 2016   RCA DES   Urinary tract infection     Enterococcus    Past Surgical History:  Procedure Laterality Date   CARDIAC  CATHETERIZATION  1989   CARDIAC CATHETERIZATION N/A 01/08/2015   Procedure: Left Heart Cath and Cors/Grafts Angiography;  Surgeon: [name], MD;  Location: [facility] INVASIVE CV LAB;  Service: Cardiovascular;  Laterality: N/A;   CARDIAC CATHETERIZATION  01/08/2015   Procedure: Coronary Stent Intervention;  Surgeon: [name], MD;  Location: [facility] INVASIVE CV LAB;  Service: Cardiovascular;;   CARPAL TUNNEL RELEASE Right 08/2010   CATARACT EXTRACTION W/ INTRAOCULAR LENS  IMPLANT, BILATERAL Bilateral    CORONARY ANGIOPLASTY  01/08/15   RCA DES   CORONARY ARTERY BYPASS GRAFT  1989   "CABG X 2"   IR THORACENTESIS ASP PLEURAL SPACE W/IMG GUIDE  05/14/2019   JOINT REPLACEMENT     TONSILLECTOMY     TOTAL HIP ARTHROPLASTY Right 2000     Current Outpatient Medications  Medication Sig Dispense Refill   apixaban (ELIQUIS) 5 MG TABS tablet Take 1 tablet (5 mg total) by mouth 2 (two) times daily. 60 tablet 2   atorvastatin (LIPITOR) 40 MG tablet Take 1 tablet (40 mg total) by mouth daily at 6 PM. 30 tablet 2   AVODART 0.5 MG capsule Take 0.5 mg by mouth See admin instructions. Take 0.5 mg by mouth every other night- at bedtime     Cholecalciferol (VITAMIN D3 PO) Take 1 capsule by mouth daily.     diltiazem (TIAZAC) 360 MG 24 hr capsule Take 360 mg by mouth daily.     empagliflozin (JARDIANCE) 25 MG TABS tablet Take 1 tablet (25 mg total) by mouth daily before breakfast. 30 tablet    furosemide (LASIX) 40 MG tablet Take 1 tablet (40 mg total) by mouth every morning. May also take 1 tablet (40 mg total) daily as needed for fluid or edema (can take second dose in the afternoon (at least 6 hours after morning dose) for fluid or swelling). TAKE 1 TABLET(40 MG) BY MOUTH DAILY. 60 tablet 0   gabapentin (NEURONTIN) 300 MG capsule TAKE 1 CAPSULE(300 MG) BY MOUTH THREE TIMES DAILY (Patient taking differently: Take 300 mg by mouth 3 (three) times daily.) 270 capsule 3   insulin glargine (LANTUS) 100 UNIT/ML injection  Inject 40 Units into the skin every morning.     metFORMIN (GLUCOPHAGE) 500 MG tablet Take 500 mg by mouth 2 (two) times daily with a meal.     metoprolol succinate (TOPROL-XL) 100 MG 24 hr tablet Take 1 tablet (100 mg total) by mouth daily. Take with or immediately following a meal. (Patient taking differently: Take 100 mg by mouth in the morning.) 180 tablet 2   Multiple Vitamin (MULTIVITAMIN) tablet Take 1 tablet by mouth daily with breakfast.     nitroGLYCERIN (NITROSTAT) 0.4 MG SL tablet Place 0.4 mg under the tongue every 5 (five) minutes as needed for chest pain (x 3 doses).     pantoprazole (PROTONIX) 40 MG tablet TAKE 1 TABLET BY MOUTH DAILY AT  NOON (Patient taking differently: Take 40 mg by mouth daily in the afternoon.) 90 tablet 3   potassium chloride SA (KLOR-CON) 20 MEQ tablet TAKE 1 TABLET(20 MEQ) BY MOUTH DAILY (Patient taking differently: Take 20 mEq by mouth daily.) 90 tablet 3   tamsulosin (FLOMAX) 0.4 MG CAPS capsule Take 0.4 mg by mouth every other day.     TYLENOL 500 MG tablet Take 500-1,000 mg by mouth every 6 (six) hours as needed for mild pain or headache.     No current facility-administered medications for this visit.    Allergies:   Septra [sulfamethoxazole-trimethoprim], Cefadroxil, Ciprofloxacin, and Erythromycin    Social History:  The patient  reports that he quit smoking about 63 years ago. His smoking use included cigarettes. He has a 30.00 pack-year smoking history. He has never used smokeless tobacco. He reports that he does not drink alcohol and does not use drugs.   Family History:  The patient's family history includes Brain cancer in his father; Throat cancer in his brother.    ROS:  Please see the history of present illness.   Otherwise, review of systems are positive for none.   All other systems are reviewed and negative.    PHYSICAL EXAM: VS:  BP 126/68   Pulse (!) 113   Ht 5\' 6"  (1.676 m)   Wt 200 lb 6.4 oz (90.9 kg)   SpO2 98%   BMI 32.35  kg/m  , BMI Body mass index is 32.35 kg/m. GEN: Well nourished, well developed, in no acute distress. Walks with cane HEENT: normal Neck: no JVD, carotid bruits, or masses Cardiac: IRIR; gr 2/6 systolic murmur RUSB>>apex, rubs, or gallops, trpedal  edema  Respiratory:  clear to auscultation bilaterally, normal work of breathing GI: soft, nontender, nondistended, + BS MS: no deformity or atrophy. No biceps tenderness Skin: warm and dry, no rash Neuro:  Strength and sensation are intact Psych: euthymic mood, full affect   EKG:  EKG is  not ordered today.      Recent Labs: 10/10/2021: ALT 33; B Natriuretic Peptide 207.5; Magnesium 1.8 10/12/2021: BUN 19; Creatinine, Ser 1.07; Hemoglobin 9.0; Platelets 381; Potassium 4.1; Sodium 142    Lipid Panel    Component Value Date/Time   CHOL 154 04/14/2021 1200   TRIG 119.0 04/14/2021 1200   HDL 54.70 04/14/2021 1200   CHOLHDL 3 04/14/2021 1200   VLDL 23.8 04/14/2021 1200   LDLCALC 75 04/14/2021 1200      Wt Readings from Last 3 Encounters:  10/22/21 200 lb 6.4 oz (90.9 kg)  10/14/21 198 lb (89.8 kg)  10/12/21 206 lb 5.6 oz (93.6 kg)      Other studies Reviewed: Additional studies/ records that were reviewed today include:   2D echo 05/14/2019 IMPRESSIONS    1. Left ventricular ejection fraction, by estimation, is 60 to 65%. The  left ventricle has normal function. The left ventricle has no regional  wall motion abnormalities. There is moderate concentric left ventricular  hypertrophy. Left ventricular  diastolic parameters are indeterminate.   2. Right ventricular systolic function is normal. The right ventricular  size is normal.   3. The mitral valve is normal in structure. No evidence of mitral valve  regurgitation. No evidence of mitral stenosis.   4. The aortic valve is normal in structure. Aortic valve regurgitation is  mild. Moderate aortic valve stenosis. Aortic valve area, by VTI measures  1.27 cm. Aortic valve  mean gradient measures 19.3 mmHg. Aortic valve Vmax  measures 2.75 m/s.   5. Compared with th echo 09/2018, ascending aorta has increased from 4.3  cm to 4.8cm. . Aortic dilatation noted. There is moderate dilatation of  the ascending aorta measuring 48 mm.   6. The inferior vena cava is normal in size with greater than 50%  respiratory variability, suggesting right atrial pressure of 3 mmHg.   Left heart catheterization 2016: Prox RCA lesion, 40% stenosed. LM lesion, 20% stenosed. Ost LAD to Prox LAD lesion, 100% stenosed. SVG . Origin lesion, 100% stenosed. Mid RCA-2 lesion, 70% stenosed. Mid RCA-1 lesion, 99% stenosed. Post intervention, there is a 0% residual stenosis.   1. Severe 2 vessel obstructive CAD. CTO of the origin of the LAD. Critical mid RCA stenosis. 2. Occluded SVG to the diagonal 3. LIMA to the LAD was not visualized but assumed patent based on clinical history and Myoview findings. 4. Normal LV EDP. 5. Successful stenting of the Mid RCA with DES. Very difficult procedure due to vessel tortuosity.   Plan: DAPT with ASA and Plavix for one month then stop ASA and continue Plavix for at least one year. Resume Coumadin tomorrow. Will assess LV function with an Echo. Stop prilosec and start protonix. Anticipate DC in am if stable. Patient noted to be bradycardic throughout case so will reduce metoprolol to 50 mg daily.  CT CHEST WITHOUT CONTRAST   TECHNIQUE: Multidetector CT imaging of the chest was performed following the standard protocol without IV contrast.   COMPARISON:  CT angiography from June 02, 2016 and from October 03, 2017   FINDINGS: Cardiovascular: Post median sternotomy for CABG. Ascending thoracic aorta measuring 4.3 cm with stable appearance. Calcified atheromatous plaque of the thoracic aorta and its branches in the chest.   Heart size is stable, extensive calcified coronary artery disease post CABG and LIMA grafting.   Engorged central pulmonary  vasculature 3.2 cm caliber of the main pulmonary artery Limited assessment of cardiovascular structures given lack of intravenous contrast.   Mediastinum/Nodes: Thoracic inlet structures are normal. No axillary lymphadenopathy. No mediastinal lymphadenopathy. No gross hilar lymphadenopathy.   Lungs/Pleura: Bandlike opacity with mild ground-glass attenuation in the LEFT lung base. Bandlike changes are present in the LEFT lower lobe. Resolution of pleural fluid seen on previous chest x-rays from March of 2021   Pulmonary nodule in the superior segment of the RIGHT lower lobe 6 mm unchanged from previous imaging (image 49, series 3) no consolidation. No pleural effusion.   (Image 72, series 3) 4 mm nodule with some surrounding ground-glass opacity in the central LEFT lower lobe   Airways are patent.   Upper Abdomen: Incidental imaging of upper abdominal contents with signs of vascular disease of the abdominal aorta. Small hiatal hernia. No acute upper abdominal process.   Musculoskeletal: No acute bone finding. No destructive bone process.   IMPRESSION: 1. Stable appearance of the mild aneurysmal dilation ascending thoracic aorta measuring 4.3 cm. Recommend annual imaging followup by CTA or MRA. This recommendation follows 2010 ACCF/AHA/AATS/ACR/ASA/SCA/SCAI/SIR/STS/SVM Guidelines for the Diagnosis and Management of Patients with Thoracic Aortic Disease. Circulation. 2010; 121: Z610-R604. Aortic aneurysm NOS (ICD10-I71.9) 2. Stable 6 mm nodule in the superior segment of the RIGHT lower lobe. 3. 4 mm nodule with some surrounding ground-glass opacity in the central LEFT lower lobe. This is new compared to prior imaging. No follow-up needed if patient is low-risk. Non-contrast chest CT can be considered in 12 months if patient is high-risk. This recommendation follows the consensus statement: Guidelines for Management  of Incidental Pulmonary Nodules Detected on CT Images: From  the Fleischner Society 2017; Radiology 2017; 284:228-243. 4. Resolution of pleural fluid seen on previous chest x-rays from March of 2021. Bandlike opacity in the area of prior LEFT lower lobe airspace disease suggests mixture of atelectasis and post infectious scarring. 5. Stable signs of vascular disease of the thoracic aorta and its branches in the chest. 6. Aortic atherosclerosis.   Aortic Atherosclerosis (ICD10-I70.0).   Aortic aneurysm NOS (ICD10-I71.9).     Electronically Signed   By: Donzetta Kohut M.D.   On: 02/05/2020 08:23  Echo 06/10/20: IMPRESSIONS     1. Left ventricular ejection fraction, by estimation, is 60 to 65%. The  left ventricle has normal function. The left ventricle has no regional  wall motion abnormalities. There is moderate left ventricular hypertrophy.  Left ventricular diastolic function   could not be evaluated.   2. Right ventricular systolic function is mildly reduced. The right  ventricular size is normal. There is moderately elevated pulmonary artery  systolic pressure.   3. Left atrial size was severely dilated.   4. Right atrial size was moderately dilated.   5. The mitral valve is abnormal. Trivial mitral valve regurgitation.   6. The aortic valve is tricuspid. There is moderate calcification of the  aortic valve. Aortic valve regurgitation is trivial. Mild to moderate  aortic valve stenosis. Aortic valve area, by VTI measures 1.47 cm. Aortic  valve mean gradient measures 18.0  mmHg. Aortic valve Vmax measures 2.78 m/s. DI is 0.30.   7. Aortic dilatation noted. There is mild dilatation of the aortic root,  measuring 43 mm. There is mild dilatation of the ascending aorta,  measuring 43 mm.   8. The inferior vena cava is normal in size with greater than 50%  respiratory variability, suggesting right atrial pressure of 3 mmHg.   Comparison(s): No significant change from prior study. 05/14/2019: LVEF  60-65%, moderate LVH, moderate AS -  mean gradient 19.3 mmHg.  IMPRESSIONS     1. Left ventricular ejection fraction, by estimation, is 55 to 60%. The  left ventricle has normal function. The left ventricle has no regional  wall motion abnormalities. There is moderate concentric left ventricular  hypertrophy. Left ventricular  diastolic function could not be evaluated. There is the interventricular  septum is flattened in systole and diastole, consistent with right  ventricular pressure and volume overload.   2. Right ventricular systolic function is mildly reduced. The right  ventricular size is mildly enlarged. There is normal pulmonary artery  systolic pressure. The estimated right ventricular systolic pressure is  15.7 mmHg.   3. Left atrial size was mildly dilated.   4. The mitral valve is degenerative. Trivial mitral valve regurgitation.  No evidence of mitral stenosis.   5. The aortic valve is tricuspid. There is moderate calcification of the  aortic valve. There is moderate thickening of the aortic valve. Aortic  valve regurgitation is trivial. Mild to moderate aortic valve stenosis.  Aortic valve area, by VTI measures  1.05 cm. Aortic valve mean gradient measures 19.3 mmHg. Aortic valve Vmax  measures 2.92 m/s.   6. Asc aorta up to 40 mm on this study. 48 mm on last study. CT scan  recorded 43 mm. Would defer to cross sectional imaging (CT). There is mild  dilatation of the ascending aorta, measuring 40 mm.   7. The inferior vena cava is normal in size with greater than 50%  respiratory variability, suggesting right atrial  pressure of 3 mmHg.   Comparison(s): No significant change from prior study.  Echo 10/10/21:    Echo 05/02/22: IMPRESSIONS     1. Left ventricular ejection fraction, by estimation, is 60 to 65%. The  left ventricle has normal function. The left ventricle has no regional  wall motion abnormalities. There is mild left ventricular hypertrophy.  Left ventricular diastolic parameters  are  indeterminate.   2. Right ventricular systolic function was not well visualized. The right  ventricular size is not well visualized. RV is poorly visualized but  function appears moderately reduced   3. The mitral valve is normal in structure. No evidence of mitral valve  regurgitation. No evidence of mitral stenosis.   4. Left atrial size was severely dilated.   5. Aortic dilatation noted. There is dilatation of the ascending aorta,  measuring 40 mm.   6. The aortic valve is tricuspid. There is severe calcification of the  aortic valve. Aortic valve regurgitation is mild. Moderate to severe  aortic valve stenosis. Moderate AS by gradients (MG 24 mmHg, Vmax 3.0  m/s), severe by AVA (0.7cm^2) and DI  (0.24). Low SV index (28 cc/m^2), suspect paradoxical low flow low  gradient severe AS    ASSESSMENT AND PLAN:  1. CAD s/p CABG in 1990 with DES to RCA in 2006:  - Not on aspirin given need for anticoagulation - Continue statin - Continue metoprolol - no active angina  2. Permanent atrial fibrillation: HR is well controlled.  - No overt bleeding - continue metoprolol and diltiazem  - Continue eliquis for stroke ppx. (does not meet requirements for reduced dose) - had panendoscopy as noted. - last Hgb improved to 11.5.   3. Acute on Chronic diastolic CHF:  - patient taking lasix every other day. - Continue metoprolol succinate - restrict salt intake  4. HTN: BP is well controlled - Continue metoprolol succinate, diltiazem, and lasix  5. Aortic stenosis: moderate on echo 05/14/19- repeat in August 2023  is unchanged.  - Continue annual surveillance echo's   6. Ascending aortic aneurysm: last echo 05/14/19 showed 4.8cm aneurysm, up from 4.3 09/2018. More recent CT 02/05/20 showed it was 4.3 cm. Stable. Stable by recent Echo. No really a candidate for surgery.       Disposition:   FU 6 months   Signed, Osmar Howton [place], MD  10/22/2021 10:02 AM

## 2022-07-08 ENCOUNTER — Encounter: Payer: Self-pay | Admitting: Cardiology

## 2022-07-08 ENCOUNTER — Ambulatory Visit: Payer: Medicare Other | Attending: Cardiology | Admitting: Cardiology

## 2022-07-08 VITALS — BP 106/60 | HR 86 | Ht 66.0 in | Wt 196.4 lb

## 2022-07-08 DIAGNOSIS — I2510 Atherosclerotic heart disease of native coronary artery without angina pectoris: Secondary | ICD-10-CM | POA: Diagnosis not present

## 2022-07-08 DIAGNOSIS — I5032 Chronic diastolic (congestive) heart failure: Secondary | ICD-10-CM | POA: Insufficient documentation

## 2022-07-08 DIAGNOSIS — Z8719 Personal history of other diseases of the digestive system: Secondary | ICD-10-CM | POA: Diagnosis not present

## 2022-07-08 DIAGNOSIS — I350 Nonrheumatic aortic (valve) stenosis: Secondary | ICD-10-CM | POA: Insufficient documentation

## 2022-07-08 DIAGNOSIS — I482 Chronic atrial fibrillation, unspecified: Secondary | ICD-10-CM | POA: Diagnosis not present

## 2022-07-08 NOTE — Addendum Note (Signed)
Addended by: Neoma Laming on: 07/08/2022 10:43 AM   Modules accepted: Orders

## 2022-07-08 NOTE — Patient Instructions (Signed)
Medication Instructions:  Continue same medications *If you need a refill on your cardiac medications before your next appointment, please call your pharmacy*   Lab Work: None ordered    Testing/Procedures: None ordered   Follow-Up: At [facility], you and your health needs are our priority.  As part of our continuing mission to provide you with exceptional heart care, we have created designated Provider Care Teams.  These Care Teams include your primary Cardiologist (physician) and Advanced Practice Providers (APPs -  Physician Assistants and Nurse Practitioners) who all work together to provide you with the care you need, when you need it.  We recommend signing up for the patient portal called "MyChart".  Sign up information is provided on this After Visit Summary.  MyChart is used to connect with patients for Virtual Visits (Telemedicine).  Patients are able to view lab/test results, encounter notes, upcoming appointments, etc.  Non-urgent messages can be sent to your provider as well.   To learn more about what you can do with MyChart, go to [email or web address].    Your next appointment:  6 months    Provider:  Dr.Jordan   

## 2022-07-09 ENCOUNTER — Encounter: Payer: Self-pay | Admitting: Physician Assistant

## 2022-07-09 ENCOUNTER — Telehealth: Payer: Self-pay | Admitting: Family Medicine

## 2022-07-09 ENCOUNTER — Ambulatory Visit (INDEPENDENT_AMBULATORY_CARE_PROVIDER_SITE_OTHER): Payer: Medicare Other | Admitting: Physician Assistant

## 2022-07-09 DIAGNOSIS — M7061 Trochanteric bursitis, right hip: Secondary | ICD-10-CM

## 2022-07-09 MED ORDER — BUPIVACAINE HCL 0.25 % IJ SOLN
2.0000 mL | INTRAMUSCULAR | Status: AC | PRN
  Administered 2022-07-09: 2 mL via INTRA_ARTICULAR

## 2022-07-09 MED ORDER — METHYLPREDNISOLONE ACETATE 40 MG/ML IJ SUSP
40.0000 mg | INTRAMUSCULAR | Status: AC | PRN
  Administered 2022-07-09: 40 mg via INTRA_ARTICULAR

## 2022-07-09 MED ORDER — TRAMADOL HCL 50 MG PO TABS: 50.0000 mg | ORAL_TABLET | Freq: Two times a day (BID) | ORAL | 2 refills | Status: DC | PRN

## 2022-07-09 MED ORDER — LIDOCAINE HCL 1 % IJ SOLN
3.0000 mL | INTRAMUSCULAR | Status: AC | PRN
  Administered 2022-07-09: 3 mL

## 2022-07-09 NOTE — Telephone Encounter (Signed)
Patient's daughter called for an update. Informed her that pcp is gone for day. Caller verbalized understanding.

## 2022-07-09 NOTE — Progress Notes (Signed)
Office Visit Note   Patient: [name]           Date of Birth: 1928-06-06           MRN: [id number] Visit Date: 07/09/2022              Requested by: [name], MD [address] [place],  [place] [address] PCP: [name], MD   Assessment & Plan: Visit Diagnoses:  1. Trochanteric bursitis, right hip     Plan: Impression is recurrent right hip trochanteric bursitis.  At this point, I have agreed to reinject his trochanteric bursa today.  We have discussed the possibility of obtaining an MRI of the right hip should his symptoms return as you could have an abductor tendon tear.  I will further discuss this with Dr. Deno Etienne if it comes to this point.  Otherwise he will follow-up with [place] as needed.  Follow-Up Instructions: Return if symptoms worsen or fail to improve.   Orders:  Orders Placed This Encounter  Procedures   Large Joint Inj   Meds ordered this encounter  Medications   traMADol (ULTRAM) 50 MG tablet    Sig: Take 1 tablet (50 mg total) by mouth every 12 (twelve) hours as needed.    Dispense:  30 tablet    Refill:  2      Procedures: Large Joint Inj on 07/09/2022 2:56 PM Indications: pain Details: 22 G needle, lateral approach Medications: 3 mL lidocaine 1 %; 2 mL bupivacaine 0.25 %; 40 mg methylPREDNISolone acetate 40 MG/ML      Clinical Data: No additional findings.   Subjective: Chief Complaint  Patient presents with   Right Hip - Pain    HPI patient is a pleasant 89 year old gentleman who comes in today with recurrent right lateral hip pain.  History of recurrent right hip trochanteric bursitis.  He has had multiple injections in the past with the last 1 being on 06/11/2022.  Previous injections have provided long-lasting relief.  Unfortunately, the injection back in April helped quite a bit but only lasted about 3-1/2 weeks.  His symptoms have returned.  All of his pain is the lateral hip.  He is asking for another injection  today.  Review of Systems as detailed in HPI.  All others reviewed and are negative.   Objective: Vital Signs: There were no vitals taken for this visit.  Physical Exam well-developed well-nourished gentleman in no acute distress.  Alert and oriented x 3.  Ortho Exam right hip exam shows moderate tenderness to the trochanteric bursa.  He is neurovascular intact distally.  Specialty Comments:  No specialty comments available.  Imaging: No new imaging   PMFS History: Patient Active Problem List   Diagnosis Date Noted   History of lower GI bleeding 06/10/2022   Hiatal hernia 05/05/2022   Osteoarthritis of metacarpophalangeal (MCP) joint of left thumb 01/18/2022   Mild cognitive impairment 03/05/2021   Stable hemispheric central retinal vein occlusion (CRVO) of right eye 07/21/2020   Moderate nonproliferative diabetic retinopathy of left eye (HCC) 06/18/2020   Thoracic aortic aneurysm without rupture (HCC) 05/26/2020   Stage 3b chronic kidney disease (HCC) 05/26/2020   Aortic stenosis, moderate 05/22/2019   Chronic diastolic heart failure (HCC) 03/14/2019   Lower extremity edema 03/14/2019   Spinal stenosis of lumbar region 02/21/2018   Degeneration of lumbar intervertebral disc 02/21/2018   Diabetic peripheral neuropathy associated with type 2 diabetes mellitus (HCC) 05/05/2017   Coronary artery disease involving  native coronary artery of native heart without angina pectoris 06/13/2016   CVA (cerebral vascular accident) (HCC) 06/06/2016   Recurrent coronary arteriosclerosis after percutaneous transluminal coronary angioplasty 04/18/2015   Epistaxis, recurrent 04/11/2015   CAD S/P PCI- Nov 2016    Chronic vertigo 03/15/2014   Chronic anticoagulation 02/06/2013   Obesity (BMI 30.0-34.9) 01/31/2012   Osteoarthritis, knee 01/31/2012   Allergic rhinitis 10/28/2011   Chronic atrial fibrillation (HCC) 07/30/2011   Hearing loss 06/08/2011   Primary osteoarthritis of hand  06/08/2011   Enlarged prostate without lower urinary tract symptoms (luts) 01/04/2011   Gastro-esophageal reflux disease without esophagitis 12/07/2010   Hx of CABG x 2 1990    Hypertension associated with diabetes (HCC)    Diabetes mellitus with diabetic nephropathy, with long-term current use of insulin (HCC)    Combined hyperlipidemia associated with type 2 diabetes mellitus (HCC)    Benign hematuria 08/27/2008   Past Medical History:  Diagnosis Date   Abdominal pain    Acute renal failure (HCC)     resolved   Arthritis    "hands & legs" (11/'10/2014)   Ataxia    Atrial fibrillation (HCC)    Bladder outlet obstruction    Bladder outlet obstruction    BPH (benign prostatic hyperplasia)    CAD (coronary artery disease)    a. CABG IN 1989. b. 01/08/2015 CTO of ost LAD, LIMA to LAD not visualized but assumed patent given myoview finding, occluded SVG to diagonal, 99% mid RCA tx w/ SYNERGY DES 3X28 mm   Constipation    Diabetes mellitus type 2, insulin dependent (HCC)    Epistaxis, recurrent Feb 2017   GERD (gastroesophageal reflux disease)    HTN (hypertension)    Hx of bacterial pneumonia    Hyperlipemia    Kidney stones    Mild aortic stenosis    Mild cognitive impairment 03/05/2021   MMSE 26/30 and 6CIT 9 03/2021 AWV   Pneumonia 04/2019   Rib fractures    left rib fractures being treated with pain medications   Stented coronary artery Nov 2016   RCA DES   Urinary tract infection     Enterococcus    Family History  Problem Relation Age of Onset   Brain cancer Father    Throat cancer Brother    Liver disease Neg Hx    Colon cancer Neg Hx    Esophageal cancer Neg Hx     Past Surgical History:  Procedure Laterality Date   CARDIAC CATHETERIZATION  1989   CARDIAC CATHETERIZATION N/A 01/08/2015   Procedure: Left Heart Cath and Cors/Grafts Angiography;  Surgeon: Peter M Swaziland, MD;  Location: [facility];  Service: Cardiovascular;  Laterality: N/A;   CARDIAC  CATHETERIZATION  01/08/2015   Procedure: Coronary Stent Intervention;  Surgeon: Peter M Swaziland, MD;  Location: [facility];  Service: Cardiovascular;;   CARPAL TUNNEL RELEASE Right 08/2010   CATARACT EXTRACTION W/ INTRAOCULAR LENS  IMPLANT, BILATERAL Bilateral    COLONOSCOPY WITH PROPOFOL N/A 05/05/2022   Procedure: COLONOSCOPY WITH PROPOFOL;  Surgeon: Hilarie Fredrickson, MD;  Location: [facility];  Service: Gastroenterology;  Laterality: N/A;   CORONARY ANGIOPLASTY  01/08/15   RCA DES   CORONARY ARTERY BYPASS GRAFT  1989   "CABG X 2"   ESOPHAGOGASTRODUODENOSCOPY (EGD) WITH PROPOFOL N/A 05/05/2022   Procedure: ESOPHAGOGASTRODUODENOSCOPY (EGD) WITH PROPOFOL;  Surgeon: Hilarie Fredrickson, MD;  Location: [facility];  Service: Gastroenterology;  Laterality: N/A;   IR THORACENTESIS ASP PLEURAL  SPACE W/IMG GUIDE  05/14/2019   JOINT REPLACEMENT     POLYPECTOMY  05/05/2022   Procedure: POLYPECTOMY;  Surgeon: Hilarie Fredrickson, MD;  Location: [facility];  Service: Gastroenterology;;   TONSILLECTOMY     TOTAL HIP ARTHROPLASTY Right 2000   Social History   Occupational History   Occupation: Insurance underwriter   Occupation: retired  Tobacco Use   Smoking status: Former    Packs/day: 3.00    Years: 10.00    Additional pack years: 0.00    Total pack years: 30.00    Types: Cigarettes    Quit date: 05/11/1958    Years since quitting: 64.2   Smokeless tobacco: Never  Vaping Use   Vaping Use: Never used  Substance and Sexual Activity   Alcohol use: No   Drug use: No   Sexual activity: Not Currently

## 2022-07-09 NOTE — Telephone Encounter (Signed)
Patient's daughter Randa Evens requests to be called asap to be advised if Patient can take more that 1 Tramadol per day. States Patient taking 1 tablet per day is not helping. Needs clarification of daily dosage.  Wants to let Dr. Mardelle Matte know that he has appointment with hip Doctor this afternoon.

## 2022-07-12 NOTE — Telephone Encounter (Signed)
Message sent thru MyChart 

## 2022-07-15 ENCOUNTER — Telehealth: Payer: Self-pay | Admitting: Cardiology

## 2022-07-15 MED ORDER — METOPROLOL TARTRATE 50 MG PO TABS: 50.0000 mg | ORAL_TABLET | Freq: Two times a day (BID) | ORAL | 3 refills | Status: DC

## 2022-07-15 NOTE — Telephone Encounter (Signed)
Returned call to patient. He states he had previously been taking metoprolol succinate 100mg  QD, then he was treated in the hospital in March 2024 and was switched to metoprolol tartrate 50mg  BID.  Per Dr. Elvis Coil note from last OV on 07/08/22 patient to continue taking metoprolol as currently prescribed: metoprolol tartrate 50mg  BID.  Refill sent to pharmacy as requested.  Patient verbalized understanding and expressed appreciation for call.

## 2022-07-15 NOTE — Telephone Encounter (Signed)
Pt c/o medication issue:  1. Name of Medication: metoprolol tartrate (LOPRESSOR) 50 MG tablet (Expired)   2. How are you currently taking this medication (dosage and times per day)? Not sure   3. Are you having a reaction (difficulty breathing--STAT)? No   4. What is your medication issue?  Pt states he was previously taking 100mg  once a day but the hospital had him taking 50mg  tablets twice daily. Pt wants to know which one should he be doing and to have it refilled over to pharmacy below with 90 day refill, please.    [id number] - [place], [place] - [address] [place] [address] Phone: [phone]  Fax: [phone]

## 2022-07-16 ENCOUNTER — Ambulatory Visit (INDEPENDENT_AMBULATORY_CARE_PROVIDER_SITE_OTHER): Payer: Medicare Other | Admitting: *Deleted

## 2022-07-16 DIAGNOSIS — E1159 Type 2 diabetes mellitus with other circulatory complications: Secondary | ICD-10-CM

## 2022-07-16 DIAGNOSIS — Z794 Long term (current) use of insulin: Secondary | ICD-10-CM

## 2022-07-16 NOTE — Patient Instructions (Signed)
Please call the care guide team at [phone] if you need to cancel or reschedule your appointment.   If you are experiencing a Mental Health or Behavioral Health Crisis or need someone to talk to, please call the Suicide and Crisis Lifeline: 988 call the [place] National Suicide Prevention Lifeline: 629-844-4661 or TTY: 909-344-2117 TTY 419-550-4491) to talk to a trained counselor call 1-800-273-TALK (toll free, 24 hour hotline) go to [facility] [address], [place] [phone]) call 911   Following is a copy of the CCM Program Consent:  CCM service includes personalized support from designated clinical staff supervised by the physician, including individualized plan of care and coordination with other care providers 24/7 contact phone numbers for assistance for urgent and routine care needs. Service will only be billed when office clinical staff spend 20 minutes or more in a month to coordinate care. Only one practitioner may furnish and bill the service in a calendar month. The patient may stop CCM services at amy time (effective at the end of the month) by phone call to the office staff. The patient will be responsible for cost sharing (co-pay) or up to 20% of the service fee (after annual deductible is met)  Following is a copy of your full provider care plan:   Goals Addressed             This Visit's Progress    CCM (DIABETES) EXPECTED OUTCOME:  MONITOR, SELF-MANAGE AND REDUCE SYMPTOMS OF DIABETES       Current Barriers:  Knowledge Deficits related to Diabetes management Chronic Disease Management support and education needs related to Diabetes, diet No Advanced Directives in place- pt declines information Pt reports he checks CBG once daily with readings in 100's range, states drinks unsweetened beverages, does not always follow a special diet, reports has all medications and taking as prescribed Patient reports he had a fall a few  weeks ago, had some pain in his neck, went to [place] in [place] and had x rays, states no injury but in process of having therapy set up through [place]  Planned Interventions: Reviewed medications with patient and discussed importance of medication adherence;        Counseled on importance of regular laboratory monitoring as prescribed;        Advised patient, providing education and rationale, to check cbg once daily and record        Review of patient status, including review of consultants reports, relevant laboratory and other test results, and medications completed;       Advised patient to discuss any issues with blood sugar (outside normal parameters)  with provider;      Reviewed importance of exercise Pain assessment completed Reviewed carbohydrate modified diet Reviewed all upcoming scheduled appointments Reviewed safety precautions  Symptom Management: Take medications as prescribed   Attend all scheduled provider appointments Call pharmacy for medication refills 3-7 days in advance of running out of medications Perform all self care activities independently  Perform IADL's (shopping, preparing meals, housekeeping, managing finances) independently Call provider office for new concerns or questions  check blood sugar at prescribed times: once daily check feet daily for cuts, sores or redness enter blood sugar readings and medication or insulin into daily log take the blood sugar log to all doctor visits take the blood sugar meter to all doctor visits trim toenails straight across drink 6 to 8 glasses of water each day fill half of plate with vegetables limit fast food meals  to no more than 1 per week manage portion size prepare main meal at home 3 to 5 days each week read food labels for fat, fiber, carbohydrates and portion size Try to exercise some daily- even if just walking for a few minutes at a time fall prevention strategies: q position slowly, use assistive device such  as walker or cane (per provider recommendations) when walking, keep walkways clear, have good lighting in room. It is important to contact your provider if you have any falls, maintain muscle strength/tone by exercise per provider recommendations.  Follow Up Plan: Telephone follow up appointment with care management team member scheduled for:   10/14/22 at 130 pm       CCM (HYPERTENSION) EXPECTED OUTCOME: MONITOR, SELF-MANAGE AND REDUCE SYMPTOMS OF HYPERTENSION       Current Barriers:  Knowledge Deficits related to Hypertension management Chronic Disease Management support and education needs related to Hypertension, diet No Advanced Directives in place- pt declines Pt reports he lives with spouse [name], is independent with all aspects of his care, continues to drive Pt reports he has blood pressure cuff but does not monitor and states " it's usually good" Patient reports he is having colonoscopy on 06/09/22 "for some blood in my stool that was found"  pt states he has not seen any active bleeding.  Pt reports he had colonoscopy and several polyps removed, otherwise unremarkable  Planned Interventions: Evaluation of current treatment plan related to hypertension self management and patient's adherence to plan as established by provider;   Reviewed medications with patient and discussed importance of compliance;  Counseled on the importance of exercise goals with target of 150 minutes per week Discussed plans with patient for ongoing care management follow up and provided patient with direct contact information for care management team; Advised patient, providing education and rationale, to monitor blood pressure daily and record, calling PCP for findings outside established parameters;  Advised patient to discuss any issues with blood pressure, medications with provider; Discussed complications of poorly controlled blood pressure such as heart disease, stroke, circulatory complications,  vision complications, kidney impairment, sexual dysfunction;  Reviewed safety precautions Reviewed low sodium diet  Symptom Management: Take medications as prescribed   Attend all scheduled provider appointments Call pharmacy for medication refills 3-7 days in advance of running out of medications Perform all self care activities independently  Perform IADL's (shopping, preparing meals, housekeeping, managing finances) independently Call provider office for new concerns or questions  check blood pressure weekly choose a place to take my blood pressure (home, clinic or office, retail store) write blood pressure results in a log or diary learn about high blood pressure keep a blood pressure log take blood pressure log to all doctor appointments call doctor for signs and symptoms of high blood pressure develop an action plan for high blood pressure keep all doctor appointments take medications for blood pressure exactly as prescribed report new symptoms to your doctor eat more whole grains, fruits and vegetables, lean meats and healthy fats Follow low sodium diet- read food labels Avoid/ limit fast food  Follow Up Plan: Telephone follow up appointment with care management team member scheduled for:  10/14/22 at 130 pm          Patient verbalizes understanding of instructions and care plan provided today and agrees to view in MyChart. Active MyChart status and patient understanding of how to access instructions and care plan via MyChart confirmed with patient.  Telephone follow up appointment with care  management team member scheduled for:  10/14/22 at 130 pm

## 2022-07-16 NOTE — Chronic Care Management (AMB) (Signed)
Chronic Care Management   CCM RN Visit Note  07/16/2022 Name: [name] MRN: [id number] DOB: [birth year]  Subjective: [name] is a 89 y.o. year old male who is a primary care patient of [name], MD. The patient was referred to the Chronic Care Management team for assistance with care management needs subsequent to provider initiation of CCM services and plan of care.    Today's Visit:  Engaged with patient by telephone for follow up visit.        Goals Addressed             This Visit's Progress    CCM (DIABETES) EXPECTED OUTCOME:  MONITOR, SELF-MANAGE AND REDUCE SYMPTOMS OF DIABETES       Current Barriers:  Knowledge Deficits related to Diabetes management Chronic Disease Management support and education needs related to Diabetes, diet No Advanced Directives in place- pt declines information Pt reports he checks CBG once daily with readings in 100's range, states drinks unsweetened beverages, does not always follow a special diet, reports has all medications and taking as prescribed Patient reports he had a fall a few weeks ago, had some pain in his neck, went to [place] in [place] and had x rays, states no injury but in process of having therapy set up through [place]  Planned Interventions: Reviewed medications with patient and discussed importance of medication adherence;        Counseled on importance of regular laboratory monitoring as prescribed;        Advised patient, providing education and rationale, to check cbg once daily and record        Review of patient status, including review of consultants reports, relevant laboratory and other test results, and medications completed;       Advised patient to discuss any issues with blood sugar (outside normal parameters)  with provider;      Reviewed importance of exercise Pain assessment completed Reviewed carbohydrate modified diet Reviewed all upcoming scheduled appointments Reviewed safety  precautions  Symptom Management: Take medications as prescribed   Attend all scheduled provider appointments Call pharmacy for medication refills 3-7 days in advance of running out of medications Perform all self care activities independently  Perform IADL's (shopping, preparing meals, housekeeping, managing finances) independently Call provider office for new concerns or questions  check blood sugar at prescribed times: once daily check feet daily for cuts, sores or redness enter blood sugar readings and medication or insulin into daily log take the blood sugar log to all doctor visits take the blood sugar meter to all doctor visits trim toenails straight across drink 6 to 8 glasses of water each day fill half of plate with vegetables limit fast food meals to no more than 1 per week manage portion size prepare main meal at home 3 to 5 days each week read food labels for fat, fiber, carbohydrates and portion size Try to exercise some daily- even if just walking for a few minutes at a time fall prevention strategies: q position slowly, use assistive device such as walker or cane (per provider recommendations) when walking, keep walkways clear, have good lighting in room. It is important to contact your provider if you have any falls, maintain muscle strength/tone by exercise per provider recommendations.  Follow Up Plan: Telephone follow up appointment with care management team member scheduled for:   10/14/22 at 130 pm       CCM (HYPERTENSION) EXPECTED OUTCOME: MONITOR, SELF-MANAGE AND REDUCE SYMPTOMS OF HYPERTENSION  Current Barriers:  Knowledge Deficits related to Hypertension management Chronic Disease Management support and education needs related to Hypertension, diet No Advanced Directives in place- pt declines Pt reports he lives with spouse [name], is independent with all aspects of his care, continues to drive Pt reports he has blood pressure cuff but does not  monitor and states " it's usually good" Patient reports he is having colonoscopy on 06/09/22 "for some blood in my stool that was found"  pt states he has not seen any active bleeding.  Pt reports he had colonoscopy and several polyps removed, otherwise unremarkable  Planned Interventions: Evaluation of current treatment plan related to hypertension self management and patient's adherence to plan as established by provider;   Reviewed medications with patient and discussed importance of compliance;  Counseled on the importance of exercise goals with target of 150 minutes per week Discussed plans with patient for ongoing care management follow up and provided patient with direct contact information for care management team; Advised patient, providing education and rationale, to monitor blood pressure daily and record, calling PCP for findings outside established parameters;  Advised patient to discuss any issues with blood pressure, medications with provider; Discussed complications of poorly controlled blood pressure such as heart disease, stroke, circulatory complications, vision complications, kidney impairment, sexual dysfunction;  Reviewed safety precautions Reviewed low sodium diet  Symptom Management: Take medications as prescribed   Attend all scheduled provider appointments Call pharmacy for medication refills 3-7 days in advance of running out of medications Perform all self care activities independently  Perform IADL's (shopping, preparing meals, housekeeping, managing finances) independently Call provider office for new concerns or questions  check blood pressure weekly choose a place to take my blood pressure (home, clinic or office, retail store) write blood pressure results in a log or diary learn about high blood pressure keep a blood pressure log take blood pressure log to all doctor appointments call doctor for signs and symptoms of high blood pressure develop an action  plan for high blood pressure keep all doctor appointments take medications for blood pressure exactly as prescribed report new symptoms to your doctor eat more whole grains, fruits and vegetables, lean meats and healthy fats Follow low sodium diet- read food labels Avoid/ limit fast food  Follow Up Plan: Telephone follow up appointment with care management team member scheduled for:  10/14/22 at 130 pm          Plan:Telephone follow up appointment with care management team member scheduled for:  10/14/22 at 130 pm  Irving Shows [facility], BSN RN Case Physicist, medical Healthcare at [facility] [phone]

## 2022-07-19 ENCOUNTER — Telehealth: Payer: Self-pay | Admitting: Pharmacist

## 2022-07-19 NOTE — Progress Notes (Signed)
Care Management & Coordination Services Pharmacy Team  Reason for Encounter: Appointment Reminder  Contacted patient to confirm telephone appointment with Erskine Emery, PharmD on 07/20/2022 at 3 pm. Spoke with patient on 07/19/2022    Star Rating Drugs:  No fill dates available   Care Gaps: Annual wellness visit in last year? Yes  If Diabetic: Last eye exam / retinopathy screening: 05/06/2021 Last diabetic foot exam: 08/03/2021   April D Calhoun, [facility] Clinical Pharmacist Assistant [phone]

## 2022-07-20 ENCOUNTER — Ambulatory Visit: Payer: Medicare Other | Admitting: Pharmacist

## 2022-07-20 NOTE — Progress Notes (Unsigned)
Care Management & Coordination Services Pharmacy Note  07/21/2022 Name:  [name] MRN:  [id number] DOB:  February 27, 1929  Summary: PharmD FU visit.  Discuss patient's DM.  He reduced basal insulin by 2 units due to hypoglycemia and lower A1c of 7.1%.  No longer having any borderline low glucose.  Medications are affordable and patient reports feeling good overall.  Recommendations/Changes made from today's visit: No changes, continue to monitor for lows  Follow up plan: FU 3 months   Subjective: [name] is an 89 y.o. year old male who is a primary patient of [name], MD.  The care coordination team was consulted for assistance with disease management and care coordination needs.    Engaged with patient by telephone for follow up visit.  Recent office visits:  04/15/2022 OV (PCP) [name], MD; Rec proceeding with work up. Will notify GI. Counseling and education given on reasons for testing, risks/beneifts. Can also get clearance from cards. Will need to hold eloquis.    03/26/2022 OV (PCP) [name], MD; no medication changes indicated.   Recent consult visits:  04/07/2022 OV Laurette Schimke) Lynann Bologna, MD; no medication changes indicated.   Hospital visits:  05/01/2022 ED to Hospital Admission due to Symptomatic bradycardia, Chronic blood loss -Cardiology has now resumed BB as below and will continue at discharge and hold his Cardizem and long-acting metoprolol. -Continue holding Apixaban in setting of GIB. -Was Holding BB and CCB on Admisison due to Bradycardia/Rate Control. -His beta-blocker was initiated and was uptitrated by cardiology due to uncontrolled rates and is now on Metropol Tartrate 50 mg p.o. twice daily -Resume Anticoagulation as soon as okay with GI post EGD     Objective:  Lab Results  Component Value Date   CREATININE 1.24 06/22/2022   BUN 31 (H) 06/22/2022   GFR 50.62 (L) 06/22/2022   EGFR 54 (L) 10/30/2021   GFRNONAA >60  05/06/2022   GFRAA 57 (L) 01/17/2020   NA 138 06/22/2022   K 4.5 06/22/2022   CALCIUM 9.4 06/22/2022   CO2 27 06/22/2022   GLUCOSE 286 (H) 06/22/2022    Lab Results  Component Value Date/Time   HGBA1C 7.1 (A) 06/10/2022 10:03 AM   HGBA1C 8.7 (H) 03/11/2022 10:54 AM   HGBA1C 7.7 (A) 11/11/2021 10:06 AM   HGBA1C 8.1 (H) 05/26/2020 10:11 AM   HGBA1C 8.4 11/21/2019 12:00 AM   HGBA1C 9.0 02/09/2018 12:00 AM   GFR 50.62 (L) 06/22/2022 11:52 AM   GFR 47.87 (L) 05/14/2022 11:41 AM   MICROALBUR <0.7 03/14/2019 08:37 AM   MICROALBUR 3.5 (H) 08/01/2017 09:40 AM    Last diabetic Eye exam:  Lab Results  Component Value Date/Time   HMDIABEYEEXA No Retinopathy 01/27/2021 12:00 AM    Last diabetic Foot exam: No results found for: "HMDIABFOOTEX"   Lab Results  Component Value Date   CHOL 147 03/11/2022   HDL 52.30 03/11/2022   LDLCALC 80 03/11/2022   TRIG 75.0 03/11/2022   CHOLHDL 3 03/11/2022       Latest Ref Rng & Units 06/22/2022   11:52 AM 05/06/2022    4:30 AM 05/03/2022    3:32 AM  Hepatic Function  Total Protein 6.0 - 8.3 g/dL 6.5  6.4    Albumin 3.5 - 5.2 g/dL 4.2  3.4  3.9   AST 0 - 37 U/L 13  19    ALT 0 - 53 U/L 13  16    Alk Phosphatase 39 - 117 U/L  57  52    Total Bilirubin 0.2 - 1.2 mg/dL 0.7  0.8      Lab Results  Component Value Date/Time   TSH 1.81 03/11/2022 10:54 AM   TSH 2.06 11/21/2019 12:00 AM   TSH 2.22 08/13/2019 11:54 AM       Latest Ref Rng & Units 06/22/2022   11:52 AM 05/14/2022   11:41 AM 05/06/2022    4:30 AM  CBC  WBC 4.0 - 10.5 K/uL 13.0  9.0  9.2   Hemoglobin 13.0 - 17.0 g/dL 16.1  09.6  9.4   Hematocrit 39.0 - 52.0 % 36.9  35.3  32.2   Platelets 150.0 - 400.0 K/uL 301.0  358.0  239     Lab Results  Component Value Date/Time   VD25OH 39.69 11/21/2019 12:00 AM   VITAMINB12 404 05/02/2022 06:40 AM   VITAMINB12 414 10/06/2021 04:25 AM    Clinical ASCVD: Yes  The ASCVD Risk score (Arnett DK, et al., 2019) failed to calculate for the  following reasons:   The 2019 ASCVD risk score is only valid for ages 31 to 41   The patient has a prior MI or stroke diagnosis        06/29/2022   10:51 AM 06/22/2022   10:59 AM 06/10/2022    9:56 AM  Depression screen PHQ 2/9  Decreased Interest 0 0 0  Down, Depressed, Hopeless 0 0 0  PHQ - 2 Score 0 0 0     Social History   Tobacco Use  Smoking Status Former   Packs/day: 3.00   Years: 10.00   Additional pack years: 0.00   Total pack years: 30.00   Types: Cigarettes   Quit date: 05/11/1958   Years since quitting: 64.2  Smokeless Tobacco Never   BP Readings from Last 3 Encounters:  07/08/22 106/60  06/29/22 138/84  06/22/22 118/62   Pulse Readings from Last 3 Encounters:  07/08/22 86  06/29/22 93  06/22/22 95   Wt Readings from Last 3 Encounters:  07/08/22 196 lb 6.4 oz (89.1 kg)  06/29/22 193 lb 12.8 oz (87.9 kg)  06/22/22 191 lb 6.4 oz (86.8 kg)   BMI Readings from Last 3 Encounters:  07/08/22 31.70 kg/m  06/29/22 31.28 kg/m  06/22/22 30.89 kg/m    Allergies  Allergen Reactions   Septra [Sulfamethoxazole-Trimethoprim] Itching   Cefadroxil Other (See Comments)    Unknown reaction    Ciprofloxacin Other (See Comments)    Unknown action     Erythromycin Other (See Comments)    Upsets the stomach     Medications Reviewed Today     Reviewed by Erroll Luna, [facility] (Pharmacist) on 07/21/22 at 0806  Med List Status: <None>   Medication Order Taking? Sig Documenting Provider Last Dose Status Informant  apixaban (ELIQUIS) 5 MG TABS tablet [id number] No Take 1 tablet (5 mg total) by mouth 2 (two) times daily. Sheikh, Omair Tidioute, DO Taking Active   atorvastatin (LIPITOR) 40 MG tablet [id number] No Take 1 tablet (40 mg total) by mouth daily at 6 PM.  Patient taking differently: Take 40 mg by mouth at bedtime.   Richarda Overlie, MD Taking Active Spouse/Significant Other  AVODART 0.5 MG capsule [id number] No Take 0.5 mg by mouth every other day. [provider] Taking Active Spouse/Significant Other  Cholecalciferol (VITAMIN D3 PO) [id number] No Take 50 mcg by mouth daily. [provider] Taking Active Spouse/Significant Other  empagliflozin (JARDIANCE) 25 MG TABS tablet [id number] No  Take 1 tablet (25 mg total) by mouth daily before breakfast. [place], Peter M, MD Taking Active Spouse/Significant Other  furosemide (LASIX) 40 MG tablet [id number] No Take 40 mg by mouth daily. [place], Peter M, MD Taking Active Spouse/Significant Other  gabapentin (NEURONTIN) 300 MG capsule [id number] No Take 2 capsules (600 mg total) by mouth 3 (three) times daily. [name], MD Taking Active Spouse/Significant Other  insulin glargine (LANTUS) 100 UNIT/ML injection [id number] No Inject 38 Units into the skin every morning. [provider] Taking Active Spouse/Significant Other  iron polysaccharides (NIFEREX) 150 MG capsule [id number] No Take 1 capsule (150 mg total) by mouth daily. Marguerita Merles [place], DO Taking Active   metFORMIN (GLUCOPHAGE) 500 MG tablet [id number] No Take 500 mg by mouth 2 (two) times daily with a meal. [provider] Taking Active Spouse/Significant Other  metoprolol tartrate (LOPRESSOR) 50 MG tablet [id number] No Take 1 tablet (50 mg total) by mouth 2 (two) times daily. [place], Peter M, MD Taking Active   ondansetron [facility]) 4 MG tablet [id number] No Take 1 tablet (4 mg total) by mouth every 6 (six) hours as needed for nausea. Marguerita Merles [place], [place] Taking Active   pantoprazole (PROTONIX) 40 MG tablet [id number] No TAKE 1 TABLET BY MOUTH DAILY AT Casandra Doffing, Petra Kuba, NP Taking Active   tamsulosin (FLOMAX) 0.4 MG CAPS capsule [id number] No Take 0.4 mg by mouth every other day. [provider] Taking Active Spouse/Significant Other  traMADol (ULTRAM) 50 MG tablet [id number] No Take 1 tablet (50 mg total) by mouth daily as needed for moderate pain (hand pain). [name], MD Taking Active   traMADol  Janean Sark) 50 MG tablet [id number] No Take 1 tablet (50 mg total) by mouth every 12 (twelve) hours as needed. Cristie Hem, [name] Taking Active   TYLENOL 500 MG tablet [id number] No Take 500-1,000 mg by mouth every 6 (six) hours as needed for mild pain or headache. [provider] Taking Active Spouse/Significant Other            SDOH:  (Social Determinants of Health) assessments and interventions performed: No Financial Resource Strain: Low Risk  (03/11/2022)   Overall Financial Resource Strain (CARDIA)    Difficulty of Paying Living Expenses: Not hard at all   Food Insecurity: No Food Insecurity (05/02/2022)   Hunger Vital Sign    Worried About Running Out of Food in the Last Year: Never true    Ran Out of Food in the Last Year: Never true    SDOH Interventions    Flowsheet Row Clinical Support from 03/11/2022 in [facility] Chronic Care Management from 02/26/2022 in [facility] Coordination from 10/19/2021 in [facility] Coord[facility] Patient Outreach Telephone from 10/08/2021 in [facility] Coordination Chronic Care Management from 01/30/2020 in [facility]  SDOH Interventions       Food Insecurity Interventions Intervention Not Indicated Intervention Not Indicated Intervention Not Indicated Intervention Not Indicated Other (Comment)  Housing Interventions Intervention Not Indicated Intervention Not Indicated Intervention Not Indicated -- --  Transportation Interventions Intervention Not Indicated Intervention Not Indicated Intervention Not Indicated Other (Comment)  [needs transportation for post hospital visit referral to transportation] Other (Comment)  Utilities Interventions Intervention Not Indicated Intervention Not Indicated -- -- --  Financial Strain Interventions Intervention Not Indicated Intervention Not Indicated -- -- --  Physical Activity  Interventions Intervention Not Indicated Patient Refused -- -- --  Stress Interventions  Intervention Not Indicated Intervention Not Indicated -- -- --  Social Connections Interventions Intervention Not Indicated Intervention Not Indicated, Patient Refused -- -- --       Medication Assistance: None required.  Patient affirms current coverage meets needs.  Medication Access: Within the past 30 days, how often has patient missed a dose of medication? 0 Is a pillbox or other method used to improve adherence? Yes  Factors that may affect medication adherence? no barriers identified Are meds synced by current pharmacy? No  Are meds delivered by current pharmacy? No  Does patient experience delays in picking up medications due to transportation concerns? No   [facility] Reviewed: Is patient disadvantaged to use [facility]?: Yes  Current Rx insurance plan: BCBS Name and location of Current pharmacy:  [id number] - [place], [place] - [address] [place] [address] [place] [address] [place] [address] Phone: [phone] Fax: [phone]  [facility] services reviewed with patient today?: Yes  Patient requests to transfer care to [facility]?: No  Reason patient declined to change pharmacies: Disadvantaged due to insurance/mail order  Compliance/Adherence/Medication fill history: Jardiance 25 mg daily - no fill date available Metformin 50 mg - no fill date available Lantus 100 u/mL - no fill date available     Care Gaps: Annual wellness visit in last year? Yes Last eye exam / retinopathy screening: 05/07/2022 Last diabetic foot exam: 08/04/2022   Assessment/Plan     Hypertension (BP goal <140/90) -Controlled, not assessed -Current treatment: Metoprolol tartrate 50mg  bid  -Medications previously tried: none noted  -Current home readings: "WNL" per patient  -Denies hypotensive/hypertensive symptoms -Educated  on BP goals and benefits of medications for prevention of heart attack, stroke and kidney damage; Importance of home blood pressure monitoring; Symptoms of hypotension and importance of maintaining adequate hydration; -Counseled to monitor BP at home as able, document, and provide log at future appointments -Recommended to continue current medication  Heart Failure (Goal: manage symptoms and prevent exacerbations)  -Controlled, not assessed -Last ejection fraction: 60-65%  -HF type: HFpEF (EF > 50%) -NYHA Class: II (slight limitation of activity) -AHA HF Stage: C (Heart disease and symptoms present) -Current treatment: Metoprolol tartrate 50mg  bid Appropriate, Effective, Safe, Accessible Furosemide 40mg  Appropriate, Effective, Safe, Accessible Jardiance 25mg  Appropriate, Effective, Safe, Accessible -Medications previously tried: none noted   -Current home BP/HR readings: controlled post hospital, were on lower end thus changed meds -Current home daily weights: monitoring daily  -Educated on Benefits of medications for managing symptoms and prolonging life Importance of weighing daily; if you gain more than 3 pounds in one day or 5 pounds in one week, contact me! -Recommended to continue current medication Had not yet switched to new beta blocker dose.  Will go pick up today from [facility].  Diabetes (A1c goal <8%) 07/21/22 -Controlled, most recent A1c is 7.1% -Current medications: Empagliflozin 25mg  once daily Appropriate, Effective, Safe, Accessible Metformin 500 mg twice daily with meal  Appropriate, Effective, Safe, Accessible Insulin glargine (Lantus) 38 units injected into skin every morning Appropriate, Effective, Safe, Accessible -Medications previously tried: none noted  -Current home glucose readings Reports home sugars are around 140s -Denies hypoglycemic/hyperglycemic symptoms -Educated on A1c and blood sugar goals; -Per patient [facility] wants A1c a little higher and  I agree.  At his age I do not want any risk of hypoglycemia.  They reduced basal insulin by 2 units to 38 units every evening.  Denies any low glucose since this change.  Encouraged him to monitor regularly and let me know if he has any glucose < 80.  Could consider further reduction in basal insulin if so. No changes at this time, CMA to FU on glucose in 30 days.       Willa Frater, PharmD Clinical Pharmacist  [facility] [phone]

## 2022-07-22 ENCOUNTER — Telehealth: Payer: Self-pay | Admitting: Family Medicine

## 2022-07-22 NOTE — Telephone Encounter (Signed)
Message sent to pt thru mychart to contact the pharmacy for a cheaper version for the testing strips

## 2022-07-22 NOTE — Telephone Encounter (Signed)
Pt called in for an update. I informed pt of message below and he verbalized understanding. Pt states the true metrix strips are $50 for 36 strips. States he was informed by pharmacist that if a prescription is sent in for this then it could be covered. Pt currently has 3 strips left.

## 2022-07-22 NOTE — Telephone Encounter (Signed)
Per pt pharmacy states his True Metrix blood glucose testing strips will be $50 a month . Patient requesting provider send in a prescription for this so that it may be covered by insurance . Please advise .

## 2022-07-23 NOTE — Telephone Encounter (Signed)
Pt called again and wants to know if the RX for strips have been sent to pharmacy. Please advise.

## 2022-07-23 NOTE — Telephone Encounter (Signed)
This has been addressed and taken care of.

## 2022-07-30 DIAGNOSIS — E1159 Type 2 diabetes mellitus with other circulatory complications: Secondary | ICD-10-CM

## 2022-07-30 DIAGNOSIS — I10 Essential (primary) hypertension: Secondary | ICD-10-CM

## 2022-07-30 DIAGNOSIS — Z794 Long term (current) use of insulin: Secondary | ICD-10-CM | POA: Diagnosis not present

## 2022-08-04 ENCOUNTER — Encounter: Payer: Self-pay | Admitting: Family Medicine

## 2022-08-04 ENCOUNTER — Ambulatory Visit (INDEPENDENT_AMBULATORY_CARE_PROVIDER_SITE_OTHER): Payer: Medicare Other | Admitting: Family Medicine

## 2022-08-04 VITALS — BP 138/80 | HR 99 | Temp 98.9°F | Ht 66.0 in | Wt 196.6 lb

## 2022-08-04 DIAGNOSIS — R42 Dizziness and giddiness: Secondary | ICD-10-CM

## 2022-08-04 DIAGNOSIS — I2510 Atherosclerotic heart disease of native coronary artery without angina pectoris: Secondary | ICD-10-CM

## 2022-08-04 DIAGNOSIS — I350 Nonrheumatic aortic (valve) stenosis: Secondary | ICD-10-CM | POA: Diagnosis not present

## 2022-08-04 DIAGNOSIS — M48062 Spinal stenosis, lumbar region with neurogenic claudication: Secondary | ICD-10-CM

## 2022-08-04 DIAGNOSIS — N400 Enlarged prostate without lower urinary tract symptoms: Secondary | ICD-10-CM

## 2022-08-04 NOTE — Progress Notes (Signed)
Subjective  CC:  Chief Complaint  Patient presents with   Dizziness    HPI: [name] is a 89 y.o. male who presents to the office today to address the problems listed above in the chief complaint. 89 year old with diabetes, heart disease, hypertension, hyperlipidemia, BPH chronic A-fib on anticoagulation and GERD presents due to dizziness.  This started approximately 3 months ago after his hospitalization for heart failure and pneumonia.  He reports that he stands if he stands too quickly, he will feel lightheaded.  However he gets up slowly he does not have any symptoms.  No significant headaches, passing out, vertigo at rest.  He thinks it is related to medications.  We reviewed his medication list in detail.  Assessment  1. Disequilibrium      Plan  Dizziness/disequilibrium: Likely multifactorial.  Addressed medication concerns.  Consider weaning off of gabapentin as he does not feel that this necessarily is helping his neuropathy symptoms or back pain.  Discussed use of Flomax, this could be lowering his blood pressure intermittently.  He feels his urination is normal so we will try stopping this and monitoring his urination symptoms.  Discussed being careful with tramadol.  Blood pressure control looks good, I did not change any of his blood pressure medications.  He has no neurologic symptoms.  No chest pain or anginal symptoms.  I reviewed recent cardiology notes.  He has been followed for aortic stenosis.  Most recent echocardiogram was stable.  He will follow-up in 2 weeks for reassessment  Follow up:  09/16/2022  No orders of the defined types were placed in this encounter.  No orders of the defined types were placed in this encounter.     I reviewed the patients updated PMH, FH, and SocHx.    Patient Active Problem List   Diagnosis Date Noted   History of lower GI bleeding 06/10/2022    Priority: High   Mild cognitive impairment 03/05/2021    Priority: High    Moderate nonproliferative diabetic retinopathy of left eye (HCC) 06/18/2020    Priority: High   Stage 3b chronic kidney disease (HCC) 05/26/2020    Priority: High   Aortic stenosis, moderate 05/22/2019    Priority: High   Chronic diastolic heart failure (HCC) 03/14/2019    Priority: High   Spinal stenosis of lumbar region 02/21/2018    Priority: High   Diabetic peripheral neuropathy associated with type 2 diabetes mellitus (HCC) 05/05/2017    Priority: High   Coronary artery disease involving native coronary artery of native heart without angina pectoris 06/13/2016    Priority: High   CVA (cerebral vascular accident) (HCC) 06/06/2016    Priority: High   Recurrent coronary arteriosclerosis after percutaneous transluminal coronary angioplasty 04/18/2015    Priority: High   CAD S/P PCI- Nov 2016     Priority: High   Chronic anticoagulation 02/06/2013    Priority: High   Chronic atrial fibrillation (HCC) 07/30/2011    Priority: High   Hx of CABG x 2 1990     Priority: High   Hypertension associated with diabetes (HCC)     Priority: High   Diabetes mellitus with diabetic nephropathy, with long-term current use of insulin (HCC)     Priority: High   Combined hyperlipidemia associated with type 2 diabetes mellitus (HCC)     Priority: High   Thoracic aortic aneurysm without rupture (HCC) 05/26/2020    Priority: Medium    Degeneration of lumbar intervertebral disc 02/21/2018  Priority: Medium    Chronic vertigo 03/15/2014    Priority: Medium    Obesity (BMI 30.0-34.9) 01/31/2012    Priority: Medium    Enlarged prostate without lower urinary tract symptoms (luts) 01/04/2011    Priority: Medium    Gastro-esophageal reflux disease without esophagitis 12/07/2010    Priority: Medium    Hiatal hernia 05/05/2022    Priority: Low   Osteoarthritis of metacarpophalangeal (MCP) joint of left thumb 01/18/2022    Priority: Low   Stable hemispheric central retinal vein occlusion (CRVO) of  right eye 07/21/2020    Priority: Low   Lower extremity edema 03/14/2019    Priority: Low   Epistaxis, recurrent 04/11/2015    Priority: Low   Osteoarthritis, knee 01/31/2012    Priority: Low   Allergic rhinitis 10/28/2011    Priority: Low   Hearing loss 06/08/2011    Priority: Low   Primary osteoarthritis of hand 06/08/2011    Priority: Low   Benign hematuria 08/27/2008    Priority: Low   Current Meds  Medication Sig   apixaban (ELIQUIS) 5 MG TABS tablet Take 1 tablet (5 mg total) by mouth 2 (two) times daily.   atorvastatin (LIPITOR) 40 MG tablet Take 1 tablet (40 mg total) by mouth daily at 6 PM. (Patient taking differently: Take 40 mg by mouth at bedtime.)   AVODART 0.5 MG capsule Take 0.5 mg by mouth every other day.   Cholecalciferol (VITAMIN D3 PO) Take 50 mcg by mouth daily.   empagliflozin (JARDIANCE) 25 MG TABS tablet Take 1 tablet (25 mg total) by mouth daily before breakfast.   furosemide (LASIX) 40 MG tablet Take 40 mg by mouth daily.   gabapentin (NEURONTIN) 300 MG capsule Take 2 capsules (600 mg total) by mouth 3 (three) times daily.   insulin glargine (LANTUS) 100 UNIT/ML injection Inject 38 Units into the skin every morning.   iron polysaccharides (NIFEREX) 150 MG capsule Take 1 capsule (150 mg total) by mouth daily.   metFORMIN (GLUCOPHAGE) 500 MG tablet Take 500 mg by mouth 2 (two) times daily with a meal.   metoprolol tartrate (LOPRESSOR) 50 MG tablet Take 1 tablet (50 mg total) by mouth 2 (two) times daily.   pantoprazole (PROTONIX) 40 MG tablet TAKE 1 TABLET BY MOUTH DAILY AT NOON   tamsulosin (FLOMAX) 0.4 MG CAPS capsule Take 0.4 mg by mouth every other day.   traMADol (ULTRAM) 50 MG tablet Take 1 tablet (50 mg total) by mouth daily as needed for moderate pain (hand pain).   traMADol (ULTRAM) 50 MG tablet Take 1 tablet (50 mg total) by mouth every 12 (twelve) hours as needed.   TYLENOL 500 MG tablet Take 500-1,000 mg by mouth every 6 (six) hours as needed for  mild pain or headache.   [DISCONTINUED] ondansetron (ZOFRAN) 4 MG tablet Take 1 tablet (4 mg total) by mouth every 6 (six) hours as needed for nausea.    Allergies: Patient is allergic to septra [sulfamethoxazole-trimethoprim], cefadroxil, ciprofloxacin, and erythromycin. Family History: Patient family history includes Brain cancer in his father; Throat cancer in his brother. Social History:  Patient  reports that he quit smoking about 64 years ago. His smoking use included cigarettes. He has a 30.00 pack-year smoking history. He has never used smokeless tobacco. He reports that he does not drink alcohol and does not use drugs.  Review of Systems: Constitutional: Negative for fever malaise or anorexia Cardiovascular: negative for chest pain Respiratory: negative for SOB or persistent cough  Gastrointestinal: negative for abdominal pain  Objective  Vitals: BP 138/80   Pulse 99   Temp 98.9 F (37.2 C)   Ht 5\' 6"  (1.676 m)   Wt 196 lb 9.6 oz (89.2 kg)   SpO2 97%   BMI 31.73 kg/m  General: no acute distress , A&Ox3 HEENT: [place]ERL, conjunctiva normal, neck is supple Cardiovascular: A-fib Respiratory:  Good breath sounds bilaterally, CTAB with normal respiratory effort   Commons side effects, risks, benefits, and alternatives for medications and treatment plan prescribed today were discussed, and the patient expressed understanding of the given instructions. Patient is instructed to call or message via MyChart if he/she has any questions or concerns regarding our treatment plan. No barriers to understanding were identified. We discussed Red Flag symptoms and signs in detail. Patient expressed understanding regarding what to do in case of urgent or emergency type symptoms.  Medication list was reconciled, printed and provided to the patient in AVS. Patient instructions and summary information was reviewed with the patient as documented in the AVS. This note was prepared with assistance of  Dragon voice recognition software. Occasional wrong-word or sound-a-like substitutions may have occurred due to the inherent limitations of voice recognition software

## 2022-08-04 NOTE — Patient Instructions (Addendum)
Please follow up as scheduled for your next visit with me: 09/16/2022   You may consider decreasing the gabapentin to just the nighttime dose for 2 weeks to see how you do. If all stays well, then you can consider stopping it. It helps back nerve pain and diabetic neuropathy foot symtpoms.   You can stop the Flomax and monitor your urination. IF you start having troubles urinating, you would restart it.   Tramadol can cause dizziness but you should only be taking this as needed for pain.   If you have any questions or concerns, please don't hesitate to send me a message via MyChart or call the office at [phone]. Thank you for visiting with [place] today! It's our pleasure caring for you.

## 2022-08-16 ENCOUNTER — Encounter: Payer: Self-pay | Admitting: Family Medicine

## 2022-08-16 ENCOUNTER — Ambulatory Visit (INDEPENDENT_AMBULATORY_CARE_PROVIDER_SITE_OTHER): Payer: Medicare Other | Admitting: Family Medicine

## 2022-08-16 VITALS — BP 138/60 | HR 113 | Temp 98.2°F | Ht 66.0 in | Wt 195.2 lb

## 2022-08-16 DIAGNOSIS — I482 Chronic atrial fibrillation, unspecified: Secondary | ICD-10-CM

## 2022-08-16 DIAGNOSIS — R42 Dizziness and giddiness: Secondary | ICD-10-CM

## 2022-08-16 NOTE — Progress Notes (Signed)
Subjective  CC:  Chief Complaint  Patient presents with   Dizziness/disequilibrium:    Pt stated that his dizzness is a little better. It comes/goes at times    HPI: [name] is a 89 y.o. male who presents to the office today to address the problems listed above in the chief complaint. 2-week follow-up for disequilibrium: Patient states he is doing fine.  Has mild symptoms of lightheadedness/dizziness upon standing when he stands quickly persists although it is intermittent.  Lasts only a few seconds.  He does well if he gets up slowly.  No other new symptoms.  No chest pain or palpitations.  No chronic vertigo.  No diplopia or neurologic symptoms.  We did decrease his gabapentin dose from 600 3 times daily to 600 nightly and he stopped his Flomax.  He has not noted any negative effects from changing his medications, however did not change his symptoms either.  Assessment  1. Disequilibrium   2. Chronic atrial fibrillation (HCC)      Plan  Disequilibrium: Likely multifactorial.  Will monitor.  Preventive measures discussed.  To stand up slowly.  Will not change any other medications at this time. Chronic atrial fibrillation: Heart rate mildly elevated today after walking.  No symptoms.  Follow up: As scheduled 09/16/2022  No orders of the defined types were placed in this encounter.  No orders of the defined types were placed in this encounter.     I reviewed the patients updated PMH, FH, and SocHx.    Patient Active Problem List   Diagnosis Date Noted   History of lower GI bleeding 06/10/2022    Priority: High   Mild cognitive impairment 03/05/2021    Priority: High   Moderate nonproliferative diabetic retinopathy of left eye (HCC) 06/18/2020    Priority: High   Stage 3b chronic kidney disease (HCC) 05/26/2020    Priority: High   Aortic stenosis, moderate 05/22/2019    Priority: High   Chronic diastolic heart failure (HCC) 03/14/2019    Priority: High   Spinal  stenosis of lumbar region 02/21/2018    Priority: High   Diabetic peripheral neuropathy associated with type 2 diabetes mellitus (HCC) 05/05/2017    Priority: High   Coronary artery disease involving native coronary artery of native heart without angina pectoris 06/13/2016    Priority: High   CVA (cerebral vascular accident) (HCC) 06/06/2016    Priority: High   Recurrent coronary arteriosclerosis after percutaneous transluminal coronary angioplasty 04/18/2015    Priority: High   CAD S/P PCI- Nov 2016     Priority: High   Chronic anticoagulation 02/06/2013    Priority: High   Chronic atrial fibrillation (HCC) 07/30/2011    Priority: High   Hx of CABG x 2 1990     Priority: High   Hypertension associated with diabetes (HCC)     Priority: High   Diabetes mellitus with diabetic nephropathy, with long-term current use of insulin (HCC)     Priority: High   Combined hyperlipidemia associated with type 2 diabetes mellitus (HCC)     Priority: High   Thoracic aortic aneurysm without rupture (HCC) 05/26/2020    Priority: Medium    Degeneration of lumbar intervertebral disc 02/21/2018    Priority: Medium    Chronic vertigo 03/15/2014    Priority: Medium    Obesity (BMI 30.0-34.9) 01/31/2012    Priority: Medium    Enlarged prostate without lower urinary tract symptoms (luts) 01/04/2011    Priority: Medium  Gastro-esophageal reflux disease without esophagitis 12/07/2010    Priority: Medium    Hiatal hernia 05/05/2022    Priority: Low   Osteoarthritis of metacarpophalangeal (MCP) joint of left thumb 01/18/2022    Priority: Low   Stable hemispheric central retinal vein occlusion (CRVO) of right eye 07/21/2020    Priority: Low   Lower extremity edema 03/14/2019    Priority: Low   Epistaxis, recurrent 04/11/2015    Priority: Low   Osteoarthritis, knee 01/31/2012    Priority: Low   Allergic rhinitis 10/28/2011    Priority: Low   Hearing loss 06/08/2011    Priority: Low   Primary  osteoarthritis of hand 06/08/2011    Priority: Low   Benign hematuria 08/27/2008    Priority: Low   Current Meds  Medication Sig   apixaban (ELIQUIS) 5 MG TABS tablet Take 1 tablet (5 mg total) by mouth 2 (two) times daily.   AVODART 0.5 MG capsule Take 0.5 mg by mouth every other day.   Cholecalciferol (VITAMIN D3 PO) Take 50 mcg by mouth daily.   empagliflozin (JARDIANCE) 25 MG TABS tablet Take 1 tablet (25 mg total) by mouth daily before breakfast.   furosemide (LASIX) 40 MG tablet Take 40 mg by mouth daily.   insulin glargine (LANTUS) 100 UNIT/ML injection Inject 38 Units into the skin every morning.   iron polysaccharides (NIFEREX) 150 MG capsule Take 1 capsule (150 mg total) by mouth daily.   metFORMIN (GLUCOPHAGE) 500 MG tablet Take 500 mg by mouth 2 (two) times daily with a meal.   metoprolol tartrate (LOPRESSOR) 50 MG tablet Take 1 tablet (50 mg total) by mouth 2 (two) times daily.   pantoprazole (PROTONIX) 40 MG tablet TAKE 1 TABLET BY MOUTH DAILY AT NOON   traMADol (ULTRAM) 50 MG tablet Take 1 tablet (50 mg total) by mouth daily as needed for moderate pain (hand pain).   traMADol (ULTRAM) 50 MG tablet Take 1 tablet (50 mg total) by mouth every 12 (twelve) hours as needed.   TYLENOL 500 MG tablet Take 500-1,000 mg by mouth every 6 (six) hours as needed for mild pain or headache.   [DISCONTINUED] atorvastatin (LIPITOR) 40 MG tablet Take 1 tablet (40 mg total) by mouth daily at 6 PM. (Patient taking differently: Take 40 mg by mouth at bedtime.)   [DISCONTINUED] gabapentin (NEURONTIN) 300 MG capsule Take 2 capsules (600 mg total) by mouth 3 (three) times daily.   [DISCONTINUED] tamsulosin (FLOMAX) 0.4 MG CAPS capsule Take 0.4 mg by mouth every other day.    Allergies: Patient is allergic to septra [sulfamethoxazole-trimethoprim], cefadroxil, ciprofloxacin, and erythromycin. Family History: Patient family history includes Brain cancer in his father; Throat cancer in his  brother. Social History:  Patient  reports that he quit smoking about 64 years ago. His smoking use included cigarettes. He has a 30.00 pack-year smoking history. He has never used smokeless tobacco. He reports that he does not drink alcohol and does not use drugs.  Review of Systems: Constitutional: Negative for fever malaise or anorexia Cardiovascular: negative for chest pain Respiratory: negative for SOB or persistent cough Gastrointestinal: negative for abdominal pain  Objective  Vitals: BP 138/60   Pulse (!) 113   Temp 98.2 F (36.8 C)   Ht 5\' 6"  (1.676 m)   Wt 195 lb 3.2 oz (88.5 kg)   SpO2 98%   BMI 31.51 kg/m  General: no acute distress , A&Ox3 HEENT: PEERL, conjunctiva normal, neck is supple Cardiovascular:  Irreg irreg +  murmur Respiratory:  Good breath sounds bilaterally, CTAB with normal respiratory effort Skin:  Warm, no rashes  Commons side effects, risks, benefits, and alternatives for medications and treatment plan prescribed today were discussed, and the patient expressed understanding of the given instructions. Patient is instructed to call or message via MyChart if he/she has any questions or concerns regarding our treatment plan. No barriers to understanding were identified. We discussed Red Flag symptoms and signs in detail. Patient expressed understanding regarding what to do in case of urgent or emergency type symptoms.  Medication list was reconciled, printed and provided to the patient in AVS. Patient instructions and summary information was reviewed with the patient as documented in the AVS. This note was prepared with assistance of Dragon voice recognition software. Occasional wrong-word or sound-a-like substitutions may have occurred due to the inherent limitations of voice recognition software

## 2022-08-19 ENCOUNTER — Ambulatory Visit: Payer: Medicare Other | Admitting: Family Medicine

## 2022-08-31 ENCOUNTER — Encounter: Payer: Self-pay | Admitting: Pharmacist

## 2022-08-31 ENCOUNTER — Other Ambulatory Visit: Payer: Self-pay | Admitting: Cardiology

## 2022-08-31 NOTE — Progress Notes (Signed)
Patient previously followed by [facility] pharmacist. Per clinical review, no pharmacist appointment needed at this time. Care guide directed to contact patient and cancel appointment and notify pharmacy team of any patient concerns.  

## 2022-09-08 ENCOUNTER — Ambulatory Visit (INDEPENDENT_AMBULATORY_CARE_PROVIDER_SITE_OTHER): Payer: Medicare Other | Admitting: Podiatry

## 2022-09-08 ENCOUNTER — Encounter: Payer: Self-pay | Admitting: Podiatry

## 2022-09-08 DIAGNOSIS — E1142 Type 2 diabetes mellitus with diabetic polyneuropathy: Secondary | ICD-10-CM | POA: Diagnosis not present

## 2022-09-08 DIAGNOSIS — M79674 Pain in right toe(s): Secondary | ICD-10-CM

## 2022-09-08 DIAGNOSIS — B351 Tinea unguium: Secondary | ICD-10-CM

## 2022-09-08 NOTE — Progress Notes (Signed)
This patient returns to my office for at risk foot care.  This patient requires this care by a professional since this patient will be at risk due to having diabetic neuropathy and coagulation defect.  Patient is taking eliquiss.  Patient says toenails are painful walking and wearing his shoes.  This patient presents for at risk foot care today.  General Appearance  Alert, conversant and in no acute stress.  Vascular  Dorsalis pedis and posterior tibial  pulses are weakly  palpable  bilaterally.  Capillary return is within normal limits  bilaterally. Temperature is within normal limits  bilaterally.  Neurologic  Senn-Weinstein monofilament wire test within normal limits  bilaterally. Muscle power within normal limits bilaterally.  Nails Thick disfigured discolored nails with subungual debris  from hallux to fifth toes bilaterally. No evidence of bacterial infection or drainage bilaterally.  Orthopedic  No limitations of motion  feet .  No crepitus or effusions noted.  No bony pathology or digital deformities noted.  Skin  normotropic skin with no porokeratosis noted bilaterally.  No signs of infections or ulcers noted.     Onychomycosis  B/L  Consent was obtained for treatment procedures.   Debridement of nails with nail nipper followed by dremel tool. Filed with dremel without incident.    Return office visit    12 weeks                Told patient to return for periodic foot care and evaluation due to potential at risk complications.   Rick Warnick DPM  

## 2022-09-14 ENCOUNTER — Encounter: Payer: Medicare Other | Admitting: Pharmacist

## 2022-09-15 ENCOUNTER — Ambulatory Visit: Payer: Medicare Other | Admitting: Family Medicine

## 2022-09-16 ENCOUNTER — Ambulatory Visit: Payer: Medicare Other | Admitting: Family Medicine

## 2022-09-30 ENCOUNTER — Encounter: Payer: Self-pay | Admitting: Family Medicine

## 2022-09-30 ENCOUNTER — Ambulatory Visit (INDEPENDENT_AMBULATORY_CARE_PROVIDER_SITE_OTHER): Payer: Medicare Other | Admitting: Family Medicine

## 2022-09-30 VITALS — BP 110/60 | HR 87 | Temp 98.1°F | Ht 66.0 in | Wt 201.8 lb

## 2022-09-30 DIAGNOSIS — E1121 Type 2 diabetes mellitus with diabetic nephropathy: Secondary | ICD-10-CM | POA: Diagnosis not present

## 2022-09-30 DIAGNOSIS — M19042 Primary osteoarthritis, left hand: Secondary | ICD-10-CM

## 2022-09-30 DIAGNOSIS — E1159 Type 2 diabetes mellitus with other circulatory complications: Secondary | ICD-10-CM | POA: Diagnosis not present

## 2022-09-30 DIAGNOSIS — I482 Chronic atrial fibrillation, unspecified: Secondary | ICD-10-CM | POA: Diagnosis not present

## 2022-09-30 DIAGNOSIS — E1169 Type 2 diabetes mellitus with other specified complication: Secondary | ICD-10-CM | POA: Diagnosis not present

## 2022-09-30 DIAGNOSIS — Z794 Long term (current) use of insulin: Secondary | ICD-10-CM

## 2022-09-30 DIAGNOSIS — N1832 Chronic kidney disease, stage 3b: Secondary | ICD-10-CM | POA: Diagnosis not present

## 2022-09-30 DIAGNOSIS — E782 Mixed hyperlipidemia: Secondary | ICD-10-CM

## 2022-09-30 DIAGNOSIS — I152 Hypertension secondary to endocrine disorders: Secondary | ICD-10-CM | POA: Diagnosis not present

## 2022-09-30 LAB — POCT GLYCOSYLATED HEMOGLOBIN (HGB A1C): Hemoglobin A1C: 7.3 % — AB (ref 4.0–5.6)

## 2022-09-30 NOTE — Progress Notes (Signed)
Subjective  CC:  Chief Complaint  Patient presents with   Diabetes    HPI: [name] is a 89 y.o. male who presents to the office today for follow up of diabetes and problems listed above in the chief complaint.  Diabetes follow up: His diabetic control is reported as Unchanged.  Daily average readings remain well-controlled with averages between 95 and 120.  He denies exertional CP or SOB or symptomatic hypoglycemia. He denies foot sores complains of worsening peripheral neuropathy symptoms.  Eye exam is current.  We have called for records. Complains of worsening leg pain.  Has chronic lumbar stenosis which is very debilitating at this time.  Standing or walking can be difficult.  Uses tramadol as needed.  No new symptoms.  He is not eligible for a steroid injection until the beginning of the year.  He continues with orthopedics.  He has a seated walker at home for use.  At stores, he uses the mechanical scooters.  His wife helps him in the home Complains of left thumb pain.  He is left-handed and this is making it very difficult for him to do basic things.  Also difficult to use the cane.  Uses Voltaren gel with minimal relief. Chronic medical problems otherwise have been well-controlled.  He continues with cardiology.  No chest pain or shortness of breath. Wt Readings from Last 3 Encounters:  09/30/22 201 lb 12.8 oz (91.5 kg)  08/16/22 195 lb 3.2 oz (88.5 kg)  08/04/22 196 lb 9.6 oz (89.2 kg)    BP Readings from Last 3 Encounters:  09/30/22 110/60  08/16/22 138/60  08/04/22 138/80    Assessment  1. Type 2 diabetes mellitus with diabetic nephropathy, with long-term current use of insulin (HCC)   2. Hypertension associated with diabetes (HCC)   3. Combined hyperlipidemia associated with type 2 diabetes mellitus (HCC)   4. Chronic atrial fibrillation (HCC)   5. Stage 3b chronic kidney disease (HCC)   6. Osteoarthritis of metacarpophalangeal (MCP) joint of left thumb       Plan  Diabetes is currently well controlled.  A1c is 7.3.  Goal A1c less than 8 given age and comorbidities.  Very stable. Continue current medications without changes for chronic medical conditions Refer to hand surgery: Perhaps a steroid injection to the MCP would be helpful. Discussed ambulatory aids  Follow up: 5 months for complete physical Orders Placed This Encounter  Procedures   Ambulatory referral to Hand Surgery   POCT HgB A1C   No orders of the defined types were placed in this encounter.     Immunization History  Administered Date(s) Administered   COVID-19, mRNA, vaccine(Comirnaty)12 years and older 03/13/2022   Fluad Quad(high Dose 65+) 11/02/2018, 11/30/2019, 11/07/2020, 11/11/2021   Influenza Split 11/14/2007, 01/01/2009, 12/23/2009, 11/29/2012, 10/31/2015   Influenza, High Dose Seasonal PF 11/02/2012, 12/12/2013, 11/15/2014, 11/24/2017, 01/17/2018   Influenza, Seasonal, Injecte, Preservative Fre 11/18/2015   Influenza,inj,Quad PF,6+ Mos 02/15/2017   Influenza,trivalent, recombinat, inj, PF 12/07/2010   Influenza-Unspecified 10/28/2011, 11/30/2014, 02/15/2017   PFIZER Comirnaty(Gray Top)Covid-19 Tri-Sucrose Vaccine 06/21/2020   PFIZER(Purple Top)SARS-COV-2 Vaccination 03/23/2019, 04/14/2019, 12/06/2019, 06/21/2020   PNEUMOCOCCAL CONJUGATE-20 08/06/2020   Pfizer Covid-19 Vaccine Bivalent Booster 73yrs & up 01/02/2021   Pneumococcal Conjugate-13 12/13/2013   Pneumococcal Polysaccharide-23 08/20/2008   Td 05/29/2002   Tdap 10/28/2011, 02/15/2017   Zoster Recombinant(Shingrix) 09/16/2016, 10/13/2016, 12/13/2016, 01/11/2017   Zoster, Live 08/20/2008    Diabetes Related Lab Review: Lab Results  Component Value Date  HGBA1C 7.3 (A) 09/30/2022   HGBA1C 7.1 (A) 06/10/2022   HGBA1C 8.7 (H) 03/11/2022    Lab Results  Component Value Date   MICROALBUR <0.7 03/14/2019   Lab Results  Component Value Date   CREATININE 1.24 06/22/2022   BUN 31 (H) 06/22/2022    NA 138 06/22/2022   K 4.5 06/22/2022   CL 102 06/22/2022   CO2 27 06/22/2022   Lab Results  Component Value Date   CHOL 147 03/11/2022   CHOL 154 04/14/2021   CHOL 154 08/04/2020   Lab Results  Component Value Date   HDL 52.30 03/11/2022   HDL 54.70 04/14/2021   HDL 46.10 08/04/2020   Lab Results  Component Value Date   LDLCALC 80 03/11/2022   LDLCALC 75 04/14/2021   LDLCALC 90 08/04/2020   Lab Results  Component Value Date   TRIG 75.0 03/11/2022   TRIG 119.0 04/14/2021   TRIG 86.0 08/04/2020   Lab Results  Component Value Date   CHOLHDL 3 03/11/2022   CHOLHDL 3 04/14/2021   CHOLHDL 3 08/04/2020   No results found for: "LDLDIRECT" The ASCVD Risk score (Arnett DK, et al., 2019) failed to calculate for the following reasons:   The 2019 ASCVD risk score is only valid for ages 55 to 28   The patient has a prior MI or stroke diagnosis I have reviewed the PMH, Fam and Soc history. Patient Active Problem List   Diagnosis Date Noted Date Diagnosed   History of lower GI bleeding 06/10/2022     Priority: High    Heme positive stools in setting of worsening anemia: colonoscopy 2024 several polyps, likely chronic bleed from polyps. Tics; otherwise ok EGD at same time: nl esophagus Dr. Marina Goodell    Mild cognitive impairment 03/05/2021     Priority: High    MMSE 26/30 and 6CIT 9 03/2021 AWV    Moderate nonproliferative diabetic retinopathy of left eye (HCC) 06/18/2020     Priority: High   Stage 3b chronic kidney disease (HCC) 05/26/2020     Priority: High   Aortic stenosis, moderate 05/22/2019     Priority: High    Stable echocardiogram 05/2020, Dr. [place]    Chronic diastolic heart failure (HCC) 03/14/2019     Priority: High   Spinal stenosis of lumbar region 02/21/2018     Priority: High   Diabetic peripheral neuropathy associated with type 2 diabetes mellitus (HCC) 05/05/2017     Priority: High    No pain    Coronary artery disease involving native coronary artery  of native heart without angina pectoris 06/13/2016     Priority: High    Overview:  Overview:  RCA DES placed Nov 2016, LIMA-LAD presumed patent based on Myoview but no visualized, SVG-Dx occluded  Last Assessment & Plan:  RCA DES placed Nov 2016, LIMA-LAD presumed patent based on Myoview but no visualized, SVG-Dx occluded    CVA (cerebral vascular accident) (HCC) 06/06/2016     Priority: High   Recurrent coronary arteriosclerosis after percutaneous transluminal coronary angioplasty 04/18/2015     Priority: High    Overview:  Overview:  RCA DES placed Nov 2016, LIMA-LAD presumed patent based on Myoview but no visualized, SVG-Dx occluded  Last Assessment & Plan:  RCA DES placed Nov 2016, LIMA-LAD presumed patent based on Myoview but no visualized, SVG-Dx occluded    CAD S/P PCI- Nov 2016      Priority: High    RCA DES placed Nov 2016, LIMA-LAD presumed patent  based on Myoview but no visualized, SVG-Dx occluded    Chronic anticoagulation 02/06/2013     Priority: High    Overview:  Monitored by cardiology    Chronic atrial fibrillation (HCC) 07/30/2011     Priority: High    CHADs VASc=5 for age, HTN, vascular disease, and DM    Hx of CABG x 2 1990      Priority: High    Status post CABG x2 in 1990 including an LIMA graft to the LAD, and a vein graft to the diagonal.     Hypertension associated with diabetes (HCC)      Priority: High   Diabetes mellitus with diabetic nephropathy, with long-term current use of insulin (HCC)      Priority: High    Neg urine microalbuminuria, not on ace.  On farxiga    Combined hyperlipidemia associated with type 2 diabetes mellitus (HCC)      Priority: High   Thoracic aortic aneurysm without rupture (HCC) 05/26/2020     Priority: Medium     Followed by cards. Stable by CT 2022    Degeneration of lumbar intervertebral disc 02/21/2018     Priority: Medium    Chronic vertigo 03/15/2014     Priority: Medium    Obesity (BMI 30.0-34.9)  01/31/2012     Priority: Medium    Enlarged prostate without lower urinary tract symptoms (luts) 01/04/2011     Priority: Medium     Overview:  Urology - avodart and tamuloscin    Gastro-esophageal reflux disease without esophagitis 12/07/2010     Priority: Medium    Hiatal hernia 05/05/2022     Priority: Low   Osteoarthritis of metacarpophalangeal (MCP) joint of left thumb 01/18/2022     Priority: Low   Stable hemispheric central retinal vein occlusion (CRVO) of right eye 07/21/2020     Priority: Low   Lower extremity edema 03/14/2019     Priority: Low   Epistaxis, recurrent 04/11/2015     Priority: Low   Osteoarthritis, knee 01/31/2012     Priority: Low   Allergic rhinitis 10/28/2011     Priority: Low   Hearing loss 06/08/2011     Priority: Low   Primary osteoarthritis of hand 06/08/2011     Priority: Low   Benign hematuria 08/27/2008     Priority: Low    Essential Hematuria - urology - Isabel Caprice w/u benign      Social History: Patient  reports that he quit smoking about 64 years ago. His smoking use included cigarettes. He started smoking about 74 years ago. He has a 30 pack-year smoking history. He has never used smokeless tobacco. He reports that he does not drink alcohol and does not use drugs.  Review of Systems: Ophthalmic: negative for eye pain, loss of vision or double vision Cardiovascular: negative for chest pain Respiratory: negative for SOB or persistent cough Gastrointestinal: negative for abdominal pain Genitourinary: negative for dysuria or gross hematuria MSK: negative for foot lesions Neurologic: negative for weakness or gait disturbance  Objective  Vitals: BP 110/60   Pulse 87   Temp 98.1 F (36.7 C)   Ht 5\' 6"  (1.676 m)   Wt 201 lb 12.8 oz (91.5 kg)   SpO2 97%   BMI 32.57 kg/m  General: well appearing, no acute distress  Psych:  Alert and oriented, normal mood and affect HEENT:  Normocephalic, atraumatic, moist mucous membranes, supple neck   Cardiovascular: Irregular irregular, no edema  respiratory:  Good breath sounds bilaterally,  CTAB with normal effort, no rales   Diabetic education: ongoing education regarding chronic disease management for diabetes was given today. We continue to reinforce the ABC's of diabetic management: A1c (<7 or 8 dependent upon patient), tight blood pressure control, and cholesterol management with goal LDL < 100 minimally. We discuss diet strategies, exercise recommendations, medication options and possible side effects. At each visit, we review recommended immunizations and preventive care recommendations for diabetics and stress that good diabetic control can prevent other problems. See below for this patient's data.   Commons side effects, risks, benefits, and alternatives for medications and treatment plan prescribed today were discussed, and the patient expressed understanding of the given instructions. Patient is instructed to call or message via MyChart if he/she has any questions or concerns regarding our treatment plan. No barriers to understanding were identified. We discussed Red Flag symptoms and signs in detail. Patient expressed understanding regarding what to do in case of urgent or emergency type symptoms.  Medication list was reconciled, printed and provided to the patient in AVS. Patient instructions and summary information was reviewed with the patient as documented in the AVS. This note was prepared with assistance of Dragon voice recognition software. Occasional wrong-word or sound-a-like substitutions may have occurred due to the inherent limitations of voice recognition software

## 2022-10-05 DIAGNOSIS — M1812 Unilateral primary osteoarthritis of first carpometacarpal joint, left hand: Secondary | ICD-10-CM | POA: Diagnosis not present

## 2022-10-13 ENCOUNTER — Encounter (HOSPITAL_COMMUNITY): Payer: Self-pay

## 2022-10-13 ENCOUNTER — Emergency Department (HOSPITAL_COMMUNITY)
Admission: EM | Admit: 2022-10-13 | Discharge: 2022-10-14 | Disposition: A | Discharge status: Home / Self Care | Payer: Medicare Other | Attending: Emergency Medicine | Admitting: Emergency Medicine

## 2022-10-13 ENCOUNTER — Other Ambulatory Visit: Payer: Self-pay

## 2022-10-13 ENCOUNTER — Emergency Department (HOSPITAL_COMMUNITY): Payer: Medicare Other

## 2022-10-13 DIAGNOSIS — Z794 Long term (current) use of insulin: Secondary | ICD-10-CM | POA: Insufficient documentation

## 2022-10-13 DIAGNOSIS — R079 Chest pain, unspecified: Secondary | ICD-10-CM | POA: Diagnosis not present

## 2022-10-13 DIAGNOSIS — Z7984 Long term (current) use of oral hypoglycemic drugs: Secondary | ICD-10-CM | POA: Insufficient documentation

## 2022-10-13 DIAGNOSIS — Z7901 Long term (current) use of anticoagulants: Secondary | ICD-10-CM | POA: Insufficient documentation

## 2022-10-13 DIAGNOSIS — I2510 Atherosclerotic heart disease of native coronary artery without angina pectoris: Secondary | ICD-10-CM | POA: Insufficient documentation

## 2022-10-13 DIAGNOSIS — Z951 Presence of aortocoronary bypass graft: Secondary | ICD-10-CM | POA: Insufficient documentation

## 2022-10-13 DIAGNOSIS — R000 Tachycardia, unspecified: Secondary | ICD-10-CM | POA: Insufficient documentation

## 2022-10-13 DIAGNOSIS — R0789 Other chest pain: Secondary | ICD-10-CM | POA: Diagnosis not present

## 2022-10-13 DIAGNOSIS — E119 Type 2 diabetes mellitus without complications: Secondary | ICD-10-CM | POA: Diagnosis not present

## 2022-10-13 DIAGNOSIS — R0600 Dyspnea, unspecified: Secondary | ICD-10-CM | POA: Diagnosis not present

## 2022-10-13 DIAGNOSIS — R911 Solitary pulmonary nodule: Secondary | ICD-10-CM | POA: Diagnosis not present

## 2022-10-13 DIAGNOSIS — I700 Atherosclerosis of aorta: Secondary | ICD-10-CM | POA: Diagnosis not present

## 2022-10-13 DIAGNOSIS — I517 Cardiomegaly: Secondary | ICD-10-CM | POA: Diagnosis not present

## 2022-10-13 LAB — CBC
HCT: 34.6 % — ABNORMAL LOW (ref 39.0–52.0)
Hemoglobin: 10 g/dL — ABNORMAL LOW (ref 13.0–17.0)
MCH: 22.2 pg — ABNORMAL LOW (ref 26.0–34.0)
MCHC: 28.9 g/dL — ABNORMAL LOW (ref 30.0–36.0)
MCV: 76.9 fL — ABNORMAL LOW (ref 80.0–100.0)
Platelets: 282 10*3/uL (ref 150–400)
RBC: 4.5 MIL/uL (ref 4.22–5.81)
RDW: 18 % — ABNORMAL HIGH (ref 11.5–15.5)
WBC: 9.4 10*3/uL (ref 4.0–10.5)
nRBC: 0 % (ref 0.0–0.2)

## 2022-10-13 NOTE — ED Triage Notes (Signed)
Patient BIB [facility] from home due to chest tightness. Patient denies CP, only complains of tightness. Patient has hx afib. Patient received 324 aspirin and 1 nitroglycerin en route. Patient A&Ox4.

## 2022-10-13 NOTE — ED Provider Notes (Signed)
[place] EMERGENCY DEPARTMENT AT [facility] Provider Note   CSN: [id number] Arrival date & time: 10/13/22  2318     History {Add pertinent medical, surgical, social history, OB history to HPI:1} Chief Complaint  Patient presents with   Chest Pain    [name] is a 89 y.o. male.  89 year old male with past medical history of coronary disease status post CABG 50 years ago, A-fib on Eliquis, pulmonary edema, diabetes, hyperlipidemia presents the ER today secondary to chest pain.  Describes it as a tightness.  States he was going to bed and had acute onset of left-sided chest tightness associated with dyspnea.  Patient states that after he got up and went onto the living room and did not getting better so they called 911.  By time they got there the pain had resolved to the point of only when he took a deep breath that he had chest tightness.  The shortness of breath gone away.  No fever or cough.  No trauma.  No other recent illnesses.  Patient is not having that this before.   Chest Pain      Home Medications Prior to Admission medications   Medication Sig Start Date End Date Taking? Authorizing Provider  apixaban (ELIQUIS) 5 MG TABS tablet Take 1 tablet (5 mg total) by mouth 2 (two) times daily. 05/09/22   Marguerita Merles Latif, DO  atorvastatin (LIPITOR) 40 MG tablet Take 1 tablet (40 mg total) by mouth at bedtime. 08/16/22   [name], MD  AVODART 0.5 MG capsule Take 0.5 mg by mouth every other day. 07/20/11   [provider]  Cholecalciferol (VITAMIN D3 PO) Take 50 mcg by mouth daily.    [provider]  empagliflozin (JARDIANCE) 25 MG TABS tablet Take 1 tablet (25 mg total) by mouth daily before breakfast. 09/03/20   [place], Peter M, MD  furosemide (LASIX) 40 MG tablet TAKE 1 TABLET(40 MG) BY MOUTH DAILY 08/31/22   [place], Peter M, MD  gabapentin (NEURONTIN) 300 MG capsule Take 2 capsules (600 mg total) by mouth at bedtime. 08/16/22   [name], MD  insulin glargine (LANTUS) 100 UNIT/ML injection Inject 38 Units into the skin every morning.    [provider]  iron polysaccharides (NIFEREX) 150 MG capsule Take 1 capsule (150 mg total) by mouth daily. 05/06/22   Marguerita Merles Latif, DO  metFORMIN (GLUCOPHAGE) 500 MG tablet Take 500 mg by mouth 2 (two) times daily with a meal.    [provider]  metoprolol tartrate (LOPRESSOR) 50 MG tablet Take 1 tablet (50 mg total) by mouth 2 (two) times daily. 07/15/22   [place], Peter M, MD  pantoprazole (PROTONIX) 40 MG tablet TAKE 1 TABLET BY MOUTH DAILY AT NOON 06/03/22   Joylene Grapes, NP  traMADol (ULTRAM) 50 MG tablet Take 1 tablet (50 mg total) by mouth every 12 (twelve) hours as needed. 07/09/22   Cristie Hem, [name]  TYLENOL 500 MG tablet Take 500-1,000 mg by mouth every 6 (six) hours as needed for mild pain or headache.    [provider]      Allergies    Septra [sulfamethoxazole-trimethoprim], Cefadroxil, Ciprofloxacin, and Erythromycin    Review of Systems   Review of Systems  Cardiovascular:  Positive for chest pain.    Physical Exam Updated Vital Signs BP 128/70   Pulse (!) 106   Temp 98.1 F (36.7 C) (Oral)   Resp 20   Ht  5\' 6"  (1.676 m)   Wt 84.4 kg   SpO2 100%   BMI 30.02 kg/m  Physical Exam Vitals and nursing note reviewed.  Constitutional:      Appearance: He is well-developed.  HENT:     Head: Normocephalic and atraumatic.  Cardiovascular:     Rate and Rhythm: Normal rate.  Pulmonary:     Effort: Pulmonary effort is normal. Tachypnea present. No respiratory distress.     Breath sounds: Examination of the left-upper field reveals decreased breath sounds. Examination of the left-lower field reveals decreased breath sounds. Decreased breath sounds present.  Abdominal:     General: There is no distension.  Musculoskeletal:        General: Normal range of motion.     Cervical back: Normal range of motion.  Neurological:      Mental Status: He is alert.     ED Results / Procedures / Treatments   Labs (all labs ordered are listed, but only abnormal results are displayed) Labs Reviewed  CBC - Abnormal; Notable for the following components:      Result Value   Hemoglobin 10.0 (*)    HCT 34.6 (*)    MCV 76.9 (*)    MCH 22.2 (*)    MCHC 28.9 (*)    RDW 18.0 (*)    All other components within normal limits  BASIC METABOLIC PANEL  BRAIN NATRIURETIC PEPTIDE  TROPONIN I (HIGH SENSITIVITY)    EKG EKG Interpretation Date/Time:  Wednesday October 13 2022 23:16:27 EDT Ventricular Rate:  108 PR Interval:    QRS Duration:  88 QT Interval:  338 QTC Calculation: 453 R Axis:   77  Text Interpretation: Atrial fibrillation Ventricular premature complex Borderline T abnormalities, diffuse leads new inverted twaves in V5-6 Confirmed by Marily Memos [id number]) on 10/13/2022 11:22:31 PM  Radiology No results found.  Procedures Procedures  {Document cardiac monitor, telemetry assessment procedure when appropriate:1}  Medications Ordered in ED Medications - No data to display  ED Course/ Medical Decision Making/ A&P   {   Click here for ABCD2, HEART and other calculatorsREFRESH Note before signing :1}                              Medical Decision Making Amount and/or Complexity of Data Reviewed Labs: ordered. Radiology: ordered.  Diminished breath sounds on the left, will get cxr to eval for edema, PTX. If these are negative will need to consider PE. ***  {Document critical care time when appropriate:1} {Document review of labs and clinical decision tools ie heart score, Chads2Vasc2 etc:1}  {Document your independent review of radiology images, and any outside records:1} {Document your discussion with family members, caretakers, and with consultants:1} {Document social determinants of health affecting pt's care:1} {Document your decision making why or why not admission, treatments were needed:1} Final  Clinical Impression(s) / ED Diagnoses Final diagnoses:  None    Rx / DC Orders ED Discharge Orders     None

## 2022-10-14 ENCOUNTER — Other Ambulatory Visit: Payer: Medicare Other | Admitting: *Deleted

## 2022-10-14 ENCOUNTER — Emergency Department (HOSPITAL_COMMUNITY): Payer: Medicare Other

## 2022-10-14 ENCOUNTER — Encounter: Payer: Self-pay | Admitting: *Deleted

## 2022-10-14 ENCOUNTER — Telehealth: Payer: Medicare Other

## 2022-10-14 DIAGNOSIS — R911 Solitary pulmonary nodule: Secondary | ICD-10-CM | POA: Diagnosis not present

## 2022-10-14 DIAGNOSIS — I700 Atherosclerosis of aorta: Secondary | ICD-10-CM | POA: Diagnosis not present

## 2022-10-14 DIAGNOSIS — R0789 Other chest pain: Secondary | ICD-10-CM | POA: Diagnosis not present

## 2022-10-14 LAB — BASIC METABOLIC PANEL
Anion gap: 11 (ref 5–15)
BUN: 20 mg/dL (ref 8–23)
CO2: 22 mmol/L (ref 22–32)
Calcium: 8.6 mg/dL — ABNORMAL LOW (ref 8.9–10.3)
Chloride: 107 mmol/L (ref 98–111)
Creatinine, Ser: 1.28 mg/dL — ABNORMAL HIGH (ref 0.61–1.24)
GFR, Estimated: 53 mL/min — ABNORMAL LOW (ref >60)
Glucose, Bld: 198 mg/dL — ABNORMAL HIGH (ref 70–99)
Potassium: 4.2 mmol/L (ref 3.5–5.1)
Sodium: 140 mmol/L (ref 135–145)

## 2022-10-14 LAB — BRAIN NATRIURETIC PEPTIDE: B Natriuretic Peptide: 216.7 pg/mL — ABNORMAL HIGH (ref 0.0–100.0)

## 2022-10-14 LAB — TROPONIN I (HIGH SENSITIVITY)
Troponin I (High Sensitivity): 11 ng/L (ref <18)
Troponin I (High Sensitivity): 13 ng/L (ref <18)

## 2022-10-14 MED ORDER — IOHEXOL 350 MG/ML SOLN
60.0000 mL | Freq: Once | INTRAVENOUS | Status: AC | PRN
  Administered 2022-10-14: 60 mL via INTRAVENOUS

## 2022-10-14 NOTE — Transitions of Care (Post Inpatient/ED Visit) (Signed)
10/14/2022  Name: [name] MRN: [id number] DOB: 07/28/1928  Today's TOC FU Call Status: Today's TOC FU Call Status:: Successful TOC FU Call Completed TOC FU Call Complete Date: 10/14/22  Transition Care Management Follow-up Telephone Call Date of Discharge: 10/14/22 Discharge Facility: [facility]) Type of Discharge: Emergency Department Reason for ED Visit: Cardiac Conditions (chest pain) Cardiac Conditions Diagnosis: Chest Pain Persisting How have you been since you were released from the hospital?: Better (pt states chest pain resolved) Any questions or concerns?: No  Items Reviewed: Did you receive and understand the discharge instructions provided?: Yes Medications obtained,verified, and reconciled?: Yes (Medications Reviewed) Any new allergies since your discharge?: No Dietary orders reviewed?: Yes Type of Diet Ordered:: heart healthy Do you have support at home?: Yes People in Home: spouse Name of Support/Comfort Primary Source: Jamelle Haring  Medications Reviewed Today: Medications Reviewed Today     Reviewed by Audrie Gallus, RN (Registered Nurse) on 10/14/22 at 1340  Med List Status: <None>   Medication Order Taking? Sig Documenting Provider Last Dose Status Informant  apixaban (ELIQUIS) 5 MG TABS tablet [id number] Yes Take 1 tablet (5 mg total) by mouth 2 (two) times daily. Sheikh, Omair [place], DO Taking Active   atorvastatin (LIPITOR) 40 MG tablet [id number] Yes Take 1 tablet (40 mg total) by mouth at bedtime. [name], MD Taking Active   AVODART 0.5 MG capsule [id number] Yes Take 0.5 mg by mouth every other day. [provider] Taking Active Spouse/Significant Other  Cholecalciferol (VITAMIN D3 PO) [id number] Yes Take 50 mcg by mouth daily. [provider] Taking Active Spouse/Significant Other  empagliflozin (JARDIANCE) 25 MG TABS tablet [id number] Yes Take 1 tablet (25 mg total) by mouth daily before breakfast. [place], Peter M, MD  Taking Active Spouse/Significant Other  furosemide (LASIX) 40 MG tablet [id number] Yes TAKE 1 TABLET(40 MG) BY MOUTH DAILY [place], Peter M, MD Taking Active   gabapentin (NEURONTIN) 300 MG capsule [id number] Yes Take 2 capsules (600 mg total) by mouth at bedtime. [name], MD Taking Active   insulin glargine (LANTUS) 100 UNIT/ML injection [id number] Yes Inject 38 Units into the skin every morning. [provider] Taking Active Spouse/Significant Other  iron polysaccharides (NIFEREX) 150 MG capsule [id number] Yes Take 1 capsule (150 mg total) by mouth daily. Marguerita Merles [place], DO Taking Active   metFORMIN (GLUCOPHAGE) 500 MG tablet [id number] Yes Take 500 mg by mouth 2 (two) times daily with a meal. [provider] Taking Active Spouse/Significant Other  metoprolol tartrate (LOPRESSOR) 50 MG tablet [id number] Yes Take 1 tablet (50 mg total) by mouth 2 (two) times daily. [place], Peter M, MD Taking Active   pantoprazole (PROTONIX) 40 MG tablet [id number] Yes TAKE 1 TABLET BY MOUTH DAILY AT Casandra Doffing, Petra Kuba, NP Taking Active   traMADol (ULTRAM) 50 MG tablet [id number] Yes Take 1 tablet (50 mg total) by mouth every 12 (twelve) hours as needed. Cristie Hem, [name] Taking Active   TYLENOL 500 MG tablet [id number] Yes Take 500-1,000 mg by mouth every 6 (six) hours as needed for mild pain or headache. [provider] Taking Active Spouse/Significant Other            Home Care and Equipment/Supplies: Were Home Health Services Ordered?: No Any new equipment or medical supplies ordered?: No  Functional Questionnaire: Do you need assistance with bathing/showering or dressing?: No Do you need assistance with meal preparation?: No Do you need assistance with eating?: No Do you have  difficulty maintaining continence: No Do you need assistance with getting out of bed/getting out of a chair/moving?: No Do you have difficulty managing or taking your medications?: No  Follow  up appointments reviewed: PCP Follow-up appointment confirmed?: Yes Date of PCP follow-up appointment?: 10/18/22 Follow-up Provider: @830  am with primary care provider [facility] Follow-up appointment confirmed?: Yes Date of Specialist follow-up appointment?: 01/17/23 Follow-Up Specialty Provider:: @ 1130 am with cardiologist Dr. [place] Do you need transportation to your follow-up appointment?: No Do you understand care options if your condition(s) worsen?: Yes-patient verbalized understanding  SDOH Interventions Today    Flowsheet Row Most Recent Value  SDOH Interventions   Food Insecurity Interventions Intervention Not Indicated  Transportation Interventions Intervention Not Indicated  Utilities Interventions Intervention Not Indicated       Irving Shows [facility], BSN [facility] Health/ Ambulatory Care Management [phone]

## 2022-10-15 ENCOUNTER — Telehealth: Payer: Self-pay | Admitting: Family Medicine

## 2022-10-15 DIAGNOSIS — R059 Cough, unspecified: Secondary | ICD-10-CM | POA: Diagnosis not present

## 2022-10-15 DIAGNOSIS — J209 Acute bronchitis, unspecified: Secondary | ICD-10-CM | POA: Diagnosis not present

## 2022-10-15 NOTE — Telephone Encounter (Signed)
Patient's spouse called stating they were running late for visit this morning. I informed caller that the visit is scheduled for Monday @ 8:30 w/ Dr. Ruthine Dose. Caller requested patient be seen today either by Dr. Ruthine Dose or Dr. Mardelle Matte. I informed caller that due to no availability between the providers and with it being a hospital f/u that's needed, we couldn't work him in here.   I spoke with Dr. Mardelle Matte who stated if patient is stable as seen via chart notes, then she recommends f/u with [facility] for Monday. She then stated if new symptoms, needs to be seen either @ UC or ED. I informed caller of this and she verbalized understanding.

## 2022-10-15 NOTE — Telephone Encounter (Signed)
Notes

## 2022-10-18 ENCOUNTER — Inpatient Hospital Stay: Payer: Medicare Other | Admitting: Family Medicine

## 2022-10-20 ENCOUNTER — Encounter (HOSPITAL_COMMUNITY): Payer: Self-pay

## 2022-10-20 ENCOUNTER — Other Ambulatory Visit: Payer: Self-pay

## 2022-10-20 ENCOUNTER — Telehealth: Payer: Self-pay | Admitting: Family Medicine

## 2022-10-20 ENCOUNTER — Emergency Department (HOSPITAL_COMMUNITY)
Admission: EM | Admit: 2022-10-20 | Discharge: 2022-10-20 | Disposition: A | Discharge status: Home / Self Care | Payer: Medicare Other | Source: Home / Self Care | Attending: Emergency Medicine | Admitting: Emergency Medicine

## 2022-10-20 ENCOUNTER — Emergency Department (HOSPITAL_COMMUNITY): Payer: Medicare Other

## 2022-10-20 DIAGNOSIS — Z8673 Personal history of transient ischemic attack (TIA), and cerebral infarction without residual deficits: Secondary | ICD-10-CM | POA: Diagnosis not present

## 2022-10-20 DIAGNOSIS — R079 Chest pain, unspecified: Secondary | ICD-10-CM | POA: Diagnosis not present

## 2022-10-20 DIAGNOSIS — R0602 Shortness of breath: Secondary | ICD-10-CM | POA: Insufficient documentation

## 2022-10-20 DIAGNOSIS — I5032 Chronic diastolic (congestive) heart failure: Secondary | ICD-10-CM | POA: Insufficient documentation

## 2022-10-20 DIAGNOSIS — R0989 Other specified symptoms and signs involving the circulatory and respiratory systems: Secondary | ICD-10-CM | POA: Diagnosis not present

## 2022-10-20 DIAGNOSIS — R0982 Postnasal drip: Secondary | ICD-10-CM | POA: Insufficient documentation

## 2022-10-20 DIAGNOSIS — N1832 Chronic kidney disease, stage 3b: Secondary | ICD-10-CM | POA: Diagnosis not present

## 2022-10-20 DIAGNOSIS — Z79899 Other long term (current) drug therapy: Secondary | ICD-10-CM | POA: Diagnosis not present

## 2022-10-20 DIAGNOSIS — I517 Cardiomegaly: Secondary | ICD-10-CM | POA: Diagnosis not present

## 2022-10-20 DIAGNOSIS — Z794 Long term (current) use of insulin: Secondary | ICD-10-CM | POA: Diagnosis not present

## 2022-10-20 DIAGNOSIS — Z7984 Long term (current) use of oral hypoglycemic drugs: Secondary | ICD-10-CM | POA: Diagnosis not present

## 2022-10-20 DIAGNOSIS — Z1152 Encounter for screening for COVID-19: Secondary | ICD-10-CM | POA: Insufficient documentation

## 2022-10-20 DIAGNOSIS — Z7901 Long term (current) use of anticoagulants: Secondary | ICD-10-CM | POA: Diagnosis not present

## 2022-10-20 DIAGNOSIS — R052 Subacute cough: Secondary | ICD-10-CM | POA: Diagnosis not present

## 2022-10-20 DIAGNOSIS — I130 Hypertensive heart and chronic kidney disease with heart failure and stage 1 through stage 4 chronic kidney disease, or unspecified chronic kidney disease: Secondary | ICD-10-CM | POA: Insufficient documentation

## 2022-10-20 DIAGNOSIS — D649 Anemia, unspecified: Secondary | ICD-10-CM | POA: Diagnosis not present

## 2022-10-20 DIAGNOSIS — I700 Atherosclerosis of aorta: Secondary | ICD-10-CM | POA: Diagnosis not present

## 2022-10-20 DIAGNOSIS — I2510 Atherosclerotic heart disease of native coronary artery without angina pectoris: Secondary | ICD-10-CM | POA: Diagnosis not present

## 2022-10-20 DIAGNOSIS — R0789 Other chest pain: Secondary | ICD-10-CM | POA: Diagnosis not present

## 2022-10-20 DIAGNOSIS — E1122 Type 2 diabetes mellitus with diabetic chronic kidney disease: Secondary | ICD-10-CM | POA: Insufficient documentation

## 2022-10-20 LAB — COMPREHENSIVE METABOLIC PANEL
ALT: 20 U/L (ref 0–44)
AST: 17 U/L (ref 15–41)
Albumin: 3.7 g/dL (ref 3.5–5.0)
Alkaline Phosphatase: 47 U/L (ref 38–126)
Anion gap: 11 (ref 5–15)
BUN: 25 mg/dL — ABNORMAL HIGH (ref 8–23)
CO2: 26 mmol/L (ref 22–32)
Calcium: 9.3 mg/dL (ref 8.9–10.3)
Chloride: 102 mmol/L (ref 98–111)
Creatinine, Ser: 0.99 mg/dL (ref 0.61–1.24)
GFR, Estimated: >60 mL/min (ref >60)
Glucose, Bld: 180 mg/dL — ABNORMAL HIGH (ref 70–99)
Potassium: 3.8 mmol/L (ref 3.5–5.1)
Sodium: 139 mmol/L (ref 135–145)
Total Bilirubin: 0.9 mg/dL (ref 0.3–1.2)
Total Protein: 6.7 g/dL (ref 6.5–8.1)

## 2022-10-20 LAB — CBC
HCT: 34.5 % — ABNORMAL LOW (ref 39.0–52.0)
Hemoglobin: 10 g/dL — ABNORMAL LOW (ref 13.0–17.0)
MCH: 22.1 pg — ABNORMAL LOW (ref 26.0–34.0)
MCHC: 29 g/dL — ABNORMAL LOW (ref 30.0–36.0)
MCV: 76.3 fL — ABNORMAL LOW (ref 80.0–100.0)
Platelets: 236 10*3/uL (ref 150–400)
RBC: 4.52 MIL/uL (ref 4.22–5.81)
RDW: 17.6 % — ABNORMAL HIGH (ref 11.5–15.5)
WBC: 10.5 10*3/uL (ref 4.0–10.5)
nRBC: 0 % (ref 0.0–0.2)

## 2022-10-20 LAB — RESP PANEL BY RT-PCR (RSV, FLU A&B, COVID)  RVPGX2
Influenza A by PCR: NEGATIVE
Influenza B by PCR: NEGATIVE
Resp Syncytial Virus by PCR: NEGATIVE
SARS Coronavirus 2 by RT PCR: NEGATIVE

## 2022-10-20 LAB — TROPONIN I (HIGH SENSITIVITY)
Troponin I (High Sensitivity): 11 ng/L (ref <18)
Troponin I (High Sensitivity): 11 ng/L (ref <18)

## 2022-10-20 LAB — BRAIN NATRIURETIC PEPTIDE: B Natriuretic Peptide: 298.9 pg/mL — ABNORMAL HIGH (ref 0.0–100.0)

## 2022-10-20 MED ORDER — LIDOCAINE VISCOUS HCL 2 % MT SOLN
15.0000 mL | Freq: Once | OROMUCOSAL | Status: AC
  Administered 2022-10-20: 15 mL via ORAL
  Filled 2022-10-20: qty 15

## 2022-10-20 MED ORDER — ALUM & MAG HYDROXIDE-SIMETH 200-200-20 MG/5ML PO SUSP
30.0000 mL | Freq: Once | ORAL | Status: AC
  Administered 2022-10-20: 30 mL via ORAL
  Filled 2022-10-20: qty 30

## 2022-10-20 NOTE — Telephone Encounter (Signed)
Spoke with patient directly, pt is ok with scheduling appt for 11/02/22 until PCP is back in the office on Tuesday 10/26/22 to see if she can squeeze him in sooner. If any cancellations sooner please move patient appt to first available slot. Thanks

## 2022-10-20 NOTE — ED Triage Notes (Signed)
Pt BIBA([facility]) from home with c/o SOB w/ chest tightness that started x2hrs ago.

## 2022-10-20 NOTE — Telephone Encounter (Signed)
Pt does not want to see anyone else. See previous msg. Trying to get it figured out.

## 2022-10-20 NOTE — Discharge Instructions (Addendum)
Thank you for allowing [place] to take care of you today.  We hope you begin feeling better soon.  To-Do: Please follow-up with your primary doctor. For your postnasal drip, you can try over the counter allergy medication (ie. Claritin, Zyrtex, Allegra, etc) and/or nasal spray (ie. Flonase, Nasocort, etc.) Please return to the Emergency Department or call 911 if you experience concerning chest pain, worsening shortness of breath, severe pain, severe fever, altered mental status, or have any reason to think that you need emergency medical care.  Thank you again.  Hope you feel better soon.  Department of Emergency Medicine [facility]

## 2022-10-20 NOTE — ED Provider Notes (Signed)
[place] EMERGENCY DEPARTMENT AT [facility] Provider Note  CSN: [id number] Arrival date & time: 10/20/22 0272  Chief Complaint(s) Shortness of Breath  HPI [name] is a 89 y.o. male {Add pertinent medical, surgical, social history, OB history to HPI:1}    Shortness of Breath   Past Medical History Past Medical History:  Diagnosis Date   Abdominal pain    Acute renal failure (HCC)     resolved   Arthritis    "hands & legs" (11/'10/2014)   Ataxia    Atrial fibrillation (HCC)    Bladder outlet obstruction    Bladder outlet obstruction    BPH (benign prostatic hyperplasia)    CAD (coronary artery disease)    a. CABG IN 1989. b. 01/08/2015 CTO of ost LAD, LIMA to LAD not visualized but assumed patent given myoview finding, occluded SVG to diagonal, 99% mid RCA tx w/ SYNERGY DES 3X28 mm   Constipation    Diabetes mellitus type 2, insulin dependent (HCC)    Epistaxis, recurrent Feb 2017   GERD (gastroesophageal reflux disease)    HTN (hypertension)    Hx of bacterial pneumonia    Hyperlipemia    Kidney stones    Mild aortic stenosis    Mild cognitive impairment 03/05/2021   MMSE 26/30 and 6CIT 9 03/2021 AWV   Pneumonia 04/2019   Rib fractures    left rib fractures being treated with pain medications   Stented coronary artery Nov 2016   RCA DES   Urinary tract infection     Enterococcus   Patient Active Problem List   Diagnosis Date Noted   History of lower GI bleeding 06/10/2022   Hiatal hernia 05/05/2022   Osteoarthritis of metacarpophalangeal (MCP) joint of left thumb 01/18/2022   Mild cognitive impairment 03/05/2021   Stable hemispheric central retinal vein occlusion (CRVO) of right eye 07/21/2020   Moderate nonproliferative diabetic retinopathy of left eye (HCC) 06/18/2020   Thoracic aortic aneurysm without rupture (HCC) 05/26/2020   Stage 3b chronic kidney disease (HCC) 05/26/2020   Aortic stenosis, moderate 05/22/2019   Chronic diastolic  heart failure (HCC) 53/66/4403   Lower extremity edema 03/14/2019   Spinal stenosis of lumbar region 02/21/2018   Degeneration of lumbar intervertebral disc 02/21/2018   Diabetic peripheral neuropathy associated with type 2 diabetes mellitus (HCC) 05/05/2017   Coronary artery disease involving native coronary artery of native heart without angina pectoris 06/13/2016   CVA (cerebral vascular accident) (HCC) 06/06/2016   Recurrent coronary arteriosclerosis after percutaneous transluminal coronary angioplasty 04/18/2015   Epistaxis, recurrent 04/11/2015   CAD S/P PCI- Nov 2016    Chronic vertigo 03/15/2014   Chronic anticoagulation 02/06/2013   Obesity (BMI 30.0-34.9) 01/31/2012   Osteoarthritis, knee 01/31/2012   Allergic rhinitis 10/28/2011   Chronic atrial fibrillation (HCC) 07/30/2011   Hearing loss 06/08/2011   Primary osteoarthritis of hand 06/08/2011   Enlarged prostate without lower urinary tract symptoms (luts) 01/04/2011   Gastro-esophageal reflux disease without esophagitis 12/07/2010   Hx of CABG x 2 1990    Hypertension associated with diabetes (HCC)    Diabetes mellitus with diabetic nephropathy, with long-term current use of insulin (HCC)    Combined hyperlipidemia associated with type 2 diabetes mellitus (HCC)    Benign hematuria 08/27/2008   Home Medication(s) Prior to Admission medications   Medication Sig Start Date End Date Taking? Authorizing Provider  apixaban (ELIQUIS) 5 MG TABS tablet Take 1 tablet (5 mg total) by mouth 2 (two) times daily.  05/09/22   Marguerita Merles Latif, DO  atorvastatin (LIPITOR) 40 MG tablet Take 1 tablet (40 mg total) by mouth at bedtime. 08/16/22   [name], MD  AVODART 0.5 MG capsule Take 0.5 mg by mouth every other day. 07/20/11   [provider]  Cholecalciferol (VITAMIN D3 PO) Take 50 mcg by mouth daily.    [provider]  empagliflozin (JARDIANCE) 25 MG TABS tablet Take 1 tablet (25 mg total) by mouth daily before  breakfast. 09/03/20   [place], Peter M, MD  furosemide (LASIX) 40 MG tablet TAKE 1 TABLET(40 MG) BY MOUTH DAILY 08/31/22   [place], Peter M, MD  gabapentin (NEURONTIN) 300 MG capsule Take 2 capsules (600 mg total) by mouth at bedtime. 08/16/22   [name], MD  insulin glargine (LANTUS) 100 UNIT/ML injection Inject 38 Units into the skin every morning.    [provider]  iron polysaccharides (NIFEREX) 150 MG capsule Take 1 capsule (150 mg total) by mouth daily. 05/06/22   Marguerita Merles Latif, DO  metFORMIN (GLUCOPHAGE) 500 MG tablet Take 500 mg by mouth 2 (two) times daily with a meal.    [provider]  metoprolol tartrate (LOPRESSOR) 50 MG tablet Take 1 tablet (50 mg total) by mouth 2 (two) times daily. 07/15/22   [place], Peter M, MD  pantoprazole (PROTONIX) 40 MG tablet TAKE 1 TABLET BY MOUTH DAILY AT NOON 06/03/22   Joylene Grapes, NP  traMADol (ULTRAM) 50 MG tablet Take 1 tablet (50 mg total) by mouth every 12 (twelve) hours as needed. 07/09/22   Cristie Hem, [name]  TYLENOL 500 MG tablet Take 500-1,000 mg by mouth every 6 (six) hours as needed for mild pain or headache.    [provider]                                                                                                                                    Allergies Septra [sulfamethoxazole-trimethoprim], Cefadroxil, Ciprofloxacin, and Erythromycin  Review of Systems Review of Systems  Respiratory:  Positive for shortness of breath.    As noted in HPI  Physical Exam Vital Signs  I have reviewed the triage vital signs BP (!) 149/65 (BP Location: Right Arm)   Pulse (!) 103   Temp 97.7 F (36.5 C) (Oral)   Resp 13   SpO2 97%  *** Physical Exam  ED Results and Treatments Labs (all labs ordered are listed, but only abnormal results are displayed) Labs Reviewed - No data to display  EKG  EKG Interpretation Date/Time:    Ventricular Rate:    PR Interval:    QRS Duration:    QT Interval:    QTC Calculation:   R Axis:      Text Interpretation:         Radiology No results found.  Medications Ordered in ED Medications - No data to display Procedures Procedures  (including critical care time) Medical Decision Making / ED Course   Medical Decision Making   ***    Final Clinical Impression(s) / ED Diagnoses Final diagnoses:  None    This chart was dictated using voice recognition software.  Despite best efforts to proofread,  errors can occur which can change the documentation meaning.

## 2022-10-20 NOTE — Telephone Encounter (Signed)
Pt came out from being at the ED several times and needs an appt asap. I let him know that Dr Mardelle Matte has no appt until the 4th of September and he started yelling that he needs an appt asap. That we HAVE to squeeze him in somewhere and he will not see another provider. Please call pt back with details. Please advise.

## 2022-10-20 NOTE — Telephone Encounter (Signed)
Pt already other appointments that overlap the available time. Trying to find another time to work hm in.

## 2022-10-26 ENCOUNTER — Encounter: Payer: Medicare Other | Admitting: Pharmacist

## 2022-10-28 ENCOUNTER — Encounter: Payer: Self-pay | Admitting: Family Medicine

## 2022-10-28 ENCOUNTER — Ambulatory Visit (INDEPENDENT_AMBULATORY_CARE_PROVIDER_SITE_OTHER): Payer: Medicare Other | Admitting: Family Medicine

## 2022-10-28 VITALS — BP 138/64 | HR 85 | Temp 98.7°F | Ht 65.0 in | Wt 192.8 lb

## 2022-10-28 DIAGNOSIS — I2510 Atherosclerotic heart disease of native coronary artery without angina pectoris: Secondary | ICD-10-CM

## 2022-10-28 DIAGNOSIS — I482 Chronic atrial fibrillation, unspecified: Secondary | ICD-10-CM

## 2022-10-28 DIAGNOSIS — I350 Nonrheumatic aortic (valve) stenosis: Secondary | ICD-10-CM | POA: Diagnosis not present

## 2022-10-28 DIAGNOSIS — Z7901 Long term (current) use of anticoagulants: Secondary | ICD-10-CM

## 2022-10-28 DIAGNOSIS — R079 Chest pain, unspecified: Secondary | ICD-10-CM | POA: Diagnosis not present

## 2022-10-28 DIAGNOSIS — I152 Hypertension secondary to endocrine disorders: Secondary | ICD-10-CM | POA: Diagnosis not present

## 2022-10-28 DIAGNOSIS — R0602 Shortness of breath: Secondary | ICD-10-CM | POA: Diagnosis not present

## 2022-10-28 DIAGNOSIS — Z7984 Long term (current) use of oral hypoglycemic drugs: Secondary | ICD-10-CM

## 2022-10-28 DIAGNOSIS — E1159 Type 2 diabetes mellitus with other circulatory complications: Secondary | ICD-10-CM

## 2022-10-28 NOTE — Progress Notes (Signed)
Subjective  CC:  Chief Complaint  Patient presents with   Hospitalization Follow-up   Shortness of Breath    10/20/2022 (4 hours) [place] [facility] at [facility]   10/13/2022 - 10/14/2022 (4 hours) [place] [facility] at [facility]  Chest pain    HPI: [name] is a 89 y.o. male who presents to the office today to address the problems listed above in the chief complaint. 89 year old male with hypertension, chronic A-fib on oral anticoagulation, history of CHF, CAD, aortic stenosis, diabetes, history of radiculopathy presents for emergency room follow-up.  He reports 2 episodes, in August 14 and August 15 in which she experienced sudden onset of left-sided chest heaviness associated with shortness of breath while lying down.  Symptoms persisted without palpitations, neck pain, nausea vomiting or diaphoresis, however an ambulance was called and he was evaluated in the emergency department.  I reviewed all records.  Evaluation was thorough, included a negative chest CT for PE, negative serial enzymes for ACS, EKG, and lab work.  His symptoms resolved spontaneously and he was discharged, however they returned the next day and he was evaluated in urgent care again.  He was released after another reassuring workup.  Later the next week, his symptoms returned.  I reviewed the emergency room evaluation from August 21 showing again a chest x-ray without acute cardiopulmonary disease, reassuring workup for ischemia, no heart failure and stable labs including chronic anemia.  His symptoms reportedly responded to a GI cocktail.  He does take chronic Protonix.  He was tender on exam in his chest wall.  Since, he reports that he continues to improve.  Daily he feels better with improved energy.  He has not had further episodes of shortness of breath or chest pain.  None of his symptoms were exertional.  None seem to have been related to meals.  He denies  palpitations or lightheadedness. Cardiology: I reviewed his last note from May.  I reviewed his most recent studies including his echocardiograms from 2024 and 2023.  He does have progressive moderate to severe aortic stenosis which seems to have changed.  He has not had an ischemic evaluation in some time.  His EF is normal. Diabetes: I reviewed his fasting sugars.  No hypoglycemia.  Sugars are very stable. Lab Results  Component Value Date   NA 139 10/20/2022   CL 102 10/20/2022   K 3.8 10/20/2022   CO2 26 10/20/2022   BUN 25 (H) 10/20/2022   CREATININE 0.99 10/20/2022   GFRNONAA >60 10/20/2022   CALCIUM 9.3 10/20/2022   PHOS 4.4 05/14/2022   ALBUMIN 3.7 10/20/2022   GLUCOSE 180 (H) 10/20/2022   Lab Results  Component Value Date   WBC 10.5 10/20/2022   HGB 10.0 (L) 10/20/2022   HCT 34.5 (L) 10/20/2022   MCV 76.3 (L) 10/20/2022   PLT 236 10/20/2022   Lab Results  Component Value Date   TSH 1.81 03/11/2022     Echocardiogram 2024 IMPRESSIONS     1. Left ventricular ejection fraction, by estimation, is 60 to 65%. The  left ventricle has normal function. The left ventricle has no regional  wall motion abnormalities. There is mild left ventricular hypertrophy.  Left ventricular diastolic parameters  are indeterminate.   2. Right ventricular systolic function was not well visualized. The right  ventricular size is not well visualized. RV is poorly visualized but  function appears moderately reduced   3. The mitral valve is  normal in structure. No evidence of mitral valve  regurgitation. No evidence of mitral stenosis.   4. Left atrial size was severely dilated.   5. Aortic dilatation noted. There is dilatation of the ascending aorta,  measuring 40 mm.   6. The aortic valve is tricuspid. There is severe calcification of the  aortic valve. Aortic valve regurgitation is mild. Moderate to severe  aortic valve stenosis. Moderate AS by gradients (MG 24 mmHg, Vmax 3.0  m/s),  severe by AVA (0.7cm^2) and DI  (0.24). Low SV index (28 cc/m^2), suspect paradoxical low flow low  gradient severe AS   Assessment  1. Chest pain, unspecified type   2. Shortness of breath   3. Severe aortic stenosis   4. Chronic atrial fibrillation (HCC)   5. Hypertension associated with diabetes (HCC)   6. Coronary artery disease involving native coronary artery of native heart without angina pectoris   7. Chronic anticoagulation      Plan  Chest pain associate with shortness of breath: Emergency room evaluations were reassuring and did not find any acute etiology for his symptoms.  However he does have coronary disease and aortic stenosis.  At this time I recommend evaluation by his cardiologist to see if there are any indications for further ischemia evaluation or assessment of his aortic stenosis.  He currently is very stable.  No medication changes are indicated today.  Continue anticoagulation, cardiac medications.  No other etiologies have been identified.  Will continue his PPI as usual. I spent a total of 52 minutes for this patient encounter. Time spent included preparation, face-to-face counseling with the patient and coordination of care, review of chart and records, and documentation of the encounter.   Follow up: As scheduled 03/17/2023  No orders of the defined types were placed in this encounter.  No orders of the defined types were placed in this encounter.     I reviewed the patients updated PMH, FH, and SocHx.    Patient Active Problem List   Diagnosis Date Noted   History of lower GI bleeding 06/10/2022    Priority: High   Mild cognitive impairment 03/05/2021    Priority: High   Moderate nonproliferative diabetic retinopathy of left eye (HCC) 06/18/2020    Priority: High   Stage 3b chronic kidney disease (HCC) 05/26/2020    Priority: High   Aortic stenosis, moderate 05/22/2019    Priority: High   Chronic diastolic heart failure (HCC) 03/14/2019     Priority: High   Spinal stenosis of lumbar region 02/21/2018    Priority: High   Diabetic peripheral neuropathy associated with type 2 diabetes mellitus (HCC) 05/05/2017    Priority: High   Coronary artery disease involving native coronary artery of native heart without angina pectoris 06/13/2016    Priority: High   CVA (cerebral vascular accident) (HCC) 06/06/2016    Priority: High   Recurrent coronary arteriosclerosis after percutaneous transluminal coronary angioplasty 04/18/2015    Priority: High   CAD S/P PCI- Nov 2016     Priority: High   Chronic anticoagulation 02/06/2013    Priority: High   Chronic atrial fibrillation (HCC) 07/30/2011    Priority: High   Hx of CABG x 2 1990     Priority: High   Hypertension associated with diabetes (HCC)     Priority: High   Diabetes mellitus with diabetic nephropathy, with long-term current use of insulin (HCC)     Priority: High   Combined hyperlipidemia associated with type 2 diabetes  mellitus (HCC)     Priority: High   Thoracic aortic aneurysm without rupture (HCC) 05/26/2020    Priority: Medium    Degeneration of lumbar intervertebral disc 02/21/2018    Priority: Medium    Chronic vertigo 03/15/2014    Priority: Medium    Obesity (BMI 30.0-34.9) 01/31/2012    Priority: Medium    Enlarged prostate without lower urinary tract symptoms (luts) 01/04/2011    Priority: Medium    Gastro-esophageal reflux disease without esophagitis 12/07/2010    Priority: Medium    Hiatal hernia 05/05/2022    Priority: Low   Osteoarthritis of metacarpophalangeal (MCP) joint of left thumb 01/18/2022    Priority: Low   Stable hemispheric central retinal vein occlusion (CRVO) of right eye 07/21/2020    Priority: Low   Lower extremity edema 03/14/2019    Priority: Low   Epistaxis, recurrent 04/11/2015    Priority: Low   Osteoarthritis, knee 01/31/2012    Priority: Low   Allergic rhinitis 10/28/2011    Priority: Low   Hearing loss 06/08/2011     Priority: Low   Primary osteoarthritis of hand 06/08/2011    Priority: Low   Benign hematuria 08/27/2008    Priority: Low   Current Meds  Medication Sig   apixaban (ELIQUIS) 5 MG TABS tablet Take 1 tablet (5 mg total) by mouth 2 (two) times daily.   atorvastatin (LIPITOR) 40 MG tablet Take 1 tablet (40 mg total) by mouth at bedtime.   AVODART 0.5 MG capsule Take 0.5 mg by mouth every other day.   Cholecalciferol (VITAMIN D3 PO) Take 50 mcg by mouth daily.   empagliflozin (JARDIANCE) 25 MG TABS tablet Take 1 tablet (25 mg total) by mouth daily before breakfast.   furosemide (LASIX) 40 MG tablet TAKE 1 TABLET(40 MG) BY MOUTH DAILY   gabapentin (NEURONTIN) 300 MG capsule Take 2 capsules (600 mg total) by mouth at bedtime.   insulin glargine (LANTUS) 100 UNIT/ML injection Inject 38 Units into the skin every morning.   iron polysaccharides (NIFEREX) 150 MG capsule Take 1 capsule (150 mg total) by mouth daily.   metFORMIN (GLUCOPHAGE) 500 MG tablet Take 500 mg by mouth 2 (two) times daily with a meal.   metoprolol tartrate (LOPRESSOR) 50 MG tablet Take 1 tablet (50 mg total) by mouth 2 (two) times daily.   pantoprazole (PROTONIX) 40 MG tablet TAKE 1 TABLET BY MOUTH DAILY AT NOON   traMADol (ULTRAM) 50 MG tablet Take 1 tablet (50 mg total) by mouth every 12 (twelve) hours as needed.   TYLENOL 500 MG tablet Take 500-1,000 mg by mouth every 6 (six) hours as needed for mild pain or headache.    Allergies: Patient is allergic to septra [sulfamethoxazole-trimethoprim], cefadroxil, ciprofloxacin, and erythromycin. Family History: Patient family history includes Brain cancer in his father; Throat cancer in his brother. Social History:  Patient  reports that he quit smoking about 64 years ago. His smoking use included cigarettes. He started smoking about 74 years ago. He has a 30 pack-year smoking history. He has never used smokeless tobacco. He reports that he does not drink alcohol and does not use  drugs.  Review of Systems: Constitutional: Negative for fever malaise or anorexia Cardiovascular: negative for chest pain Respiratory: negative for SOB or persistent cough Gastrointestinal: negative for abdominal pain  Objective  Vitals: BP 138/64   Pulse 85   Temp 98.7 F (37.1 C)   Ht 5\' 5"  (1.651 m)   Wt 192 lb 12.8  oz (87.5 kg)   SpO2 98%   BMI 32.08 kg/m  General: no acute distress , A&Ox3, appears happy HEENT: PEERL, conjunctiva normal, neck is supple Cardiovascular: Irregularly irregular with murmur Respiratory:  Good breath sounds bilaterally, CTAB with normal respiratory effort, no rales or wheezing, no chest wall tenderness Gastrointestinal: soft, flat abdomen, normal active bowel sounds, no palpable masses, no hepatosplenomegaly, no appreciated hernias Nontender Extremities: No edema Skin:  Warm, no rashes  Commons side effects, risks, benefits, and alternatives for medications and treatment plan prescribed today were discussed, and the patient expressed understanding of the given instructions. Patient is instructed to call or message via MyChart if he/she has any questions or concerns regarding our treatment plan. No barriers to understanding were identified. We discussed Red Flag symptoms and signs in detail. Patient expressed understanding regarding what to do in case of urgent or emergency type symptoms.  Medication list was reconciled, printed and provided to the patient in AVS. Patient instructions and summary information was reviewed with the patient as documented in the AVS. This note was prepared with assistance of Dragon voice recognition software. Occasional wrong-word or sound-a-like substitutions may have occurred due to the inherent limitations of voice recognition software

## 2022-11-02 ENCOUNTER — Other Ambulatory Visit: Payer: Medicare Other | Admitting: *Deleted

## 2022-11-02 NOTE — Patient Outreach (Signed)
Care Management   Visit Note  11/02/2022 Name: [name] MRN: [id number] DOB: 10-Nov-1928  Subjective: [name] is a 89 y.o. year old male who is a primary care patient of [name], MD. The Care Management team was consulted for assistance.      Engaged with patient spoke with patient by telephone for follow up   Goals Addressed             This Visit's Progress    CCM (DIABETES) EXPECTED OUTCOME:  MONITOR, SELF-MANAGE AND REDUCE SYMPTOMS OF DIABETES       Current Barriers:  Knowledge Deficits related to Diabetes management Chronic Disease Management support and education needs related to Diabetes, diet No Advanced Directives in place- pt declines information Pt reports he checks CBG once daily with readings in 100's range with recent readings of 104,112,117, states drinks unsweetened beverages, does not always follow a special diet, reports has all medications and taking as prescribed Patient reports he had a fall a few weeks ago, had some pain in his neck, went to [place] in [place] and had x rays, states no injury   Planned Interventions: Reviewed medications with patient and discussed importance of medication adherence;        Counseled on importance of regular laboratory monitoring as prescribed;        Advised patient, providing education and rationale, to check cbg once daily and record        Review of patient status, including review of consultants reports, relevant laboratory and other test results, and medications completed;       Advised patient to discuss any issues with blood sugar (outside normal parameters)  with provider;      Reviewed importance of exercise Pain assessment completed Reinforced carbohydrate modified diet Reviewed all upcoming scheduled appointments Reinforced safety precautions  Symptom Management: Take medications as prescribed   Attend all scheduled provider appointments Call pharmacy for medication refills 3-7 days in  advance of running out of medications Perform all self care activities independently  Perform IADL's (shopping, preparing meals, housekeeping, managing finances) independently Call provider office for new concerns or questions  check blood sugar at prescribed times: once daily check feet daily for cuts, sores or redness enter blood sugar readings and medication or insulin into daily log take the blood sugar log to all doctor visits take the blood sugar meter to all doctor visits trim toenails straight across drink 6 to 8 glasses of water each day fill half of plate with vegetables limit fast food meals to no more than 1 per week manage portion size prepare main meal at home 3 to 5 days each week read food labels for fat, fiber, carbohydrates and portion size Try to exercise some daily- even if just walking for a few minutes at a time fall prevention strategies: q position slowly, use assistive device such as walker or cane (per provider recommendations) when walking, keep walkways clear, have good lighting in room. It is important to contact your provider if you have any falls, maintain muscle strength/tone by exercise per provider recommendations.  Follow Up Plan: Telephone follow up appointment with care management team member scheduled for:   01/11/23 at 945 am       CCM (HYPERTENSION) EXPECTED OUTCOME: MONITOR, SELF-MANAGE AND REDUCE SYMPTOMS OF HYPERTENSION       Current Barriers:  Knowledge Deficits related to Hypertension management Chronic Disease Management support and education needs related to Hypertension, diet No Advanced Directives in place- pt declines  Pt reports he lives with spouse [name], is independent with all aspects of his care, continues to drive Pt reports he has blood pressure cuff but does not monitor and states " it's usually good" Patient reports he is having colonoscopy on 06/09/22 "for some blood in my stool that was found"  pt states he has not seen any  active bleeding.  Pt reports he had colonoscopy and several polyps removed, otherwise unremarkable Patient presented to ED on 10/20/22 for dyspnea, states " I feel better, no shortness of breath now"  pt to follow up with cardiologist in October. Patient reports he has all medications and taking as prescribed  Planned Interventions: Evaluation of current treatment plan related to hypertension self management and patient's adherence to plan as established by provider;   Reviewed medications with patient and discussed importance of compliance;  Counseled on the importance of exercise goals with target of 150 minutes per week Discussed plans with patient for ongoing care management follow up and provided patient with direct contact information for care management team; Advised patient, providing education and rationale, to monitor blood pressure daily and record, calling PCP for findings outside established parameters;  Advised patient to discuss any issues with blood pressure, medications with provider; Discussed complications of poorly controlled blood pressure such as heart disease, stroke, circulatory complications, vision complications, kidney impairment, sexual dysfunction;  Reviewed safety precautions Reinforced low sodium diet Reviewed importance of calling 911 for unrelieved shortness of breath or chest pain  Symptom Management: Take medications as prescribed   Attend all scheduled provider appointments Call pharmacy for medication refills 3-7 days in advance of running out of medications Perform all self care activities independently  Perform IADL's (shopping, preparing meals, housekeeping, managing finances) independently Call provider office for new concerns or questions  check blood pressure weekly choose a place to take my blood pressure (home, clinic or office, retail store) write blood pressure results in a log or diary learn about high blood pressure keep a blood pressure  log take blood pressure log to all doctor appointments call doctor for signs and symptoms of high blood pressure develop an action plan for high blood pressure keep all doctor appointments take medications for blood pressure exactly as prescribed report new symptoms to your doctor eat more whole grains, fruits and vegetables, lean meats and healthy fats Follow low sodium diet- read food labels Avoid/ limit fast food Call 911 for chest pain, unrelieved shortness of breath  Follow Up Plan: Telephone follow up appointment with care management team member scheduled for:  01/11/23 at 945 am           Plan: Telephone follow up appointment with care management team member scheduled for: 01/11/23 at 945 am  Irving Shows [facility], BSN [place]/ Ambulatory Care Management [phone]

## 2022-11-02 NOTE — Patient Instructions (Signed)
Visit Information  Thank you for taking time to visit with me today. Please don't hesitate to contact me if I can be of assistance to you before our next scheduled telephone appointment.  Following are the goals we discussed today:   Goals Addressed             This Visit's Progress    CCM (DIABETES) EXPECTED OUTCOME:  MONITOR, SELF-MANAGE AND REDUCE SYMPTOMS OF DIABETES       Current Barriers:  Knowledge Deficits related to Diabetes management Chronic Disease Management support and education needs related to Diabetes, diet No Advanced Directives in place- pt declines information Pt reports he checks CBG once daily with readings in 100's range with recent readings of 104,112,117, states drinks unsweetened beverages, does not always follow a special diet, reports has all medications and taking as prescribed Patient reports he had a fall a few weeks ago, had some pain in his neck, went to [place] in [place] and had x rays, states no injury   Planned Interventions: Reviewed medications with patient and discussed importance of medication adherence;        Counseled on importance of regular laboratory monitoring as prescribed;        Advised patient, providing education and rationale, to check cbg once daily and record        Review of patient status, including review of consultants reports, relevant laboratory and other test results, and medications completed;       Advised patient to discuss any issues with blood sugar (outside normal parameters)  with provider;      Reviewed importance of exercise Pain assessment completed Reinforced carbohydrate modified diet Reviewed all upcoming scheduled appointments Reinforced safety precautions  Symptom Management: Take medications as prescribed   Attend all scheduled provider appointments Call pharmacy for medication refills 3-7 days in advance of running out of medications Perform all self care activities independently  Perform IADL's  (shopping, preparing meals, housekeeping, managing finances) independently Call provider office for new concerns or questions  check blood sugar at prescribed times: once daily check feet daily for cuts, sores or redness enter blood sugar readings and medication or insulin into daily log take the blood sugar log to all doctor visits take the blood sugar meter to all doctor visits trim toenails straight across drink 6 to 8 glasses of water each day fill half of plate with vegetables limit fast food meals to no more than 1 per week manage portion size prepare main meal at home 3 to 5 days each week read food labels for fat, fiber, carbohydrates and portion size Try to exercise some daily- even if just walking for a few minutes at a time fall prevention strategies: q position slowly, use assistive device such as walker or cane (per provider recommendations) when walking, keep walkways clear, have good lighting in room. It is important to contact your provider if you have any falls, maintain muscle strength/tone by exercise per provider recommendations.  Follow Up Plan: Telephone follow up appointment with care management team member scheduled for:   01/11/23 at 945 am       CCM (HYPERTENSION) EXPECTED OUTCOME: MONITOR, SELF-MANAGE AND REDUCE SYMPTOMS OF HYPERTENSION       Current Barriers:  Knowledge Deficits related to Hypertension management Chronic Disease Management support and education needs related to Hypertension, diet No Advanced Directives in place- pt declines Pt reports he lives with spouse Qadry Belmonte, is independent with all aspects of his care, continues to drive Pt  reports he has blood pressure cuff but does not monitor and states " it's usually good" Patient reports he is having colonoscopy on 06/09/22 "for some blood in my stool that was found"  pt states he has not seen any active bleeding.  Pt reports he had colonoscopy and several polyps removed, otherwise  unremarkable Patient presented to ED on 10/20/22 for dyspnea, states " I feel better, no shortness of breath now"  pt to follow up with cardiologist in October. Patient reports he has all medications and taking as prescribed  Planned Interventions: Evaluation of current treatment plan related to hypertension self management and patient's adherence to plan as established by provider;   Reviewed medications with patient and discussed importance of compliance;  Counseled on the importance of exercise goals with target of 150 minutes per week Discussed plans with patient for ongoing care management follow up and provided patient with direct contact information for care management team; Advised patient, providing education and rationale, to monitor blood pressure daily and record, calling PCP for findings outside established parameters;  Advised patient to discuss any issues with blood pressure, medications with provider; Discussed complications of poorly controlled blood pressure such as heart disease, stroke, circulatory complications, vision complications, kidney impairment, sexual dysfunction;  Reviewed safety precautions Reinforced low sodium diet Reviewed importance of calling 911 for unrelieved shortness of breath or chest pain  Symptom Management: Take medications as prescribed   Attend all scheduled provider appointments Call pharmacy for medication refills 3-7 days in advance of running out of medications Perform all self care activities independently  Perform IADL's (shopping, preparing meals, housekeeping, managing finances) independently Call provider office for new concerns or questions  check blood pressure weekly choose a place to take my blood pressure (home, clinic or office, retail store) write blood pressure results in a log or diary learn about high blood pressure keep a blood pressure log take blood pressure log to all doctor appointments call doctor for signs and  symptoms of high blood pressure develop an action plan for high blood pressure keep all doctor appointments take medications for blood pressure exactly as prescribed report new symptoms to your doctor eat more whole grains, fruits and vegetables, lean meats and healthy fats Follow low sodium diet- read food labels Avoid/ limit fast food Call 911 for chest pain, unrelieved shortness of breath  Follow Up Plan: Telephone follow up appointment with care management team member scheduled for:  01/11/23 at 945 am           Our next appointment is by telephone on 01/11/23 at 945 am  Please call the care guide team at [phone] if you need to cancel or reschedule your appointment.   If you are experiencing a Mental Health or Behavioral Health Crisis or need someone to talk to, please call the Suicide and Crisis Lifeline: 988 call the [place] National Suicide Prevention Lifeline: 862-401-1373 or TTY: 732-883-9836 TTY 901-506-9738) to talk to a trained counselor call 1-800-273-TALK (toll free, 24 hour hotline) go to [facility] [address], [place] [phone]) call 911   Patient verbalizes understanding of instructions and care plan provided today and agrees to view in MyChart. Active MyChart status and patient understanding of how to access instructions and care plan via MyChart confirmed with patient.     Telephone follow up appointment with care management team member scheduled for: 01/11/23 at 945 am  Irving Shows [facility] [place]/ Ambulatory Care Management [phone]

## 2022-11-12 DIAGNOSIS — Z23 Encounter for immunization: Secondary | ICD-10-CM | POA: Diagnosis not present

## 2022-11-30 ENCOUNTER — Encounter (HOSPITAL_COMMUNITY): Payer: Self-pay

## 2022-11-30 ENCOUNTER — Emergency Department (HOSPITAL_COMMUNITY)
Admission: EM | Admit: 2022-11-30 | Discharge: 2022-11-30 | Disposition: A | Discharge status: Home / Self Care | Payer: Medicare Other | Attending: Emergency Medicine | Admitting: Emergency Medicine

## 2022-11-30 ENCOUNTER — Emergency Department (HOSPITAL_COMMUNITY): Payer: Medicare Other

## 2022-11-30 DIAGNOSIS — R918 Other nonspecific abnormal finding of lung field: Secondary | ICD-10-CM | POA: Diagnosis not present

## 2022-11-30 DIAGNOSIS — Z7984 Long term (current) use of oral hypoglycemic drugs: Secondary | ICD-10-CM | POA: Diagnosis not present

## 2022-11-30 DIAGNOSIS — N1832 Chronic kidney disease, stage 3b: Secondary | ICD-10-CM | POA: Diagnosis not present

## 2022-11-30 DIAGNOSIS — Z951 Presence of aortocoronary bypass graft: Secondary | ICD-10-CM | POA: Insufficient documentation

## 2022-11-30 DIAGNOSIS — E1122 Type 2 diabetes mellitus with diabetic chronic kidney disease: Secondary | ICD-10-CM | POA: Insufficient documentation

## 2022-11-30 DIAGNOSIS — M79603 Pain in arm, unspecified: Secondary | ICD-10-CM | POA: Diagnosis not present

## 2022-11-30 DIAGNOSIS — I129 Hypertensive chronic kidney disease with stage 1 through stage 4 chronic kidney disease, or unspecified chronic kidney disease: Secondary | ICD-10-CM | POA: Diagnosis not present

## 2022-11-30 DIAGNOSIS — Z794 Long term (current) use of insulin: Secondary | ICD-10-CM | POA: Insufficient documentation

## 2022-11-30 DIAGNOSIS — I517 Cardiomegaly: Secondary | ICD-10-CM | POA: Diagnosis not present

## 2022-11-30 DIAGNOSIS — R0601 Orthopnea: Secondary | ICD-10-CM | POA: Diagnosis not present

## 2022-11-30 DIAGNOSIS — Z7901 Long term (current) use of anticoagulants: Secondary | ICD-10-CM | POA: Diagnosis not present

## 2022-11-30 DIAGNOSIS — Z79899 Other long term (current) drug therapy: Secondary | ICD-10-CM | POA: Diagnosis not present

## 2022-11-30 DIAGNOSIS — I10 Essential (primary) hypertension: Secondary | ICD-10-CM | POA: Diagnosis not present

## 2022-11-30 DIAGNOSIS — Z87442 Personal history of urinary calculi: Secondary | ICD-10-CM | POA: Diagnosis not present

## 2022-11-30 DIAGNOSIS — R079 Chest pain, unspecified: Secondary | ICD-10-CM | POA: Diagnosis not present

## 2022-11-30 DIAGNOSIS — R0602 Shortness of breath: Secondary | ICD-10-CM | POA: Diagnosis not present

## 2022-11-30 DIAGNOSIS — I2510 Atherosclerotic heart disease of native coronary artery without angina pectoris: Secondary | ICD-10-CM | POA: Insufficient documentation

## 2022-11-30 LAB — BASIC METABOLIC PANEL
Anion gap: 15 (ref 5–15)
BUN: 19 mg/dL (ref 8–23)
CO2: 26 mmol/L (ref 22–32)
Calcium: 9.3 mg/dL (ref 8.9–10.3)
Chloride: 101 mmol/L (ref 98–111)
Creatinine, Ser: 1.23 mg/dL (ref 0.61–1.24)
GFR, Estimated: 55 mL/min — ABNORMAL LOW (ref >60)
Glucose, Bld: 81 mg/dL (ref 70–99)
Potassium: 3.6 mmol/L (ref 3.5–5.1)
Sodium: 142 mmol/L (ref 135–145)

## 2022-11-30 LAB — CBC
HCT: 35.1 % — ABNORMAL LOW (ref 39.0–52.0)
Hemoglobin: 10.1 g/dL — ABNORMAL LOW (ref 13.0–17.0)
MCH: 21.9 pg — ABNORMAL LOW (ref 26.0–34.0)
MCHC: 28.8 g/dL — ABNORMAL LOW (ref 30.0–36.0)
MCV: 76 fL — ABNORMAL LOW (ref 80.0–100.0)
Platelets: 253 10*3/uL (ref 150–400)
RBC: 4.62 MIL/uL (ref 4.22–5.81)
RDW: 17.8 % — ABNORMAL HIGH (ref 11.5–15.5)
WBC: 9.3 10*3/uL (ref 4.0–10.5)
nRBC: 0 % (ref 0.0–0.2)

## 2022-11-30 LAB — TROPONIN I (HIGH SENSITIVITY)
Troponin I (High Sensitivity): 13 ng/L (ref <18)
Troponin I (High Sensitivity): 9 ng/L (ref <18)

## 2022-11-30 LAB — BRAIN NATRIURETIC PEPTIDE: B Natriuretic Peptide: 288 pg/mL — ABNORMAL HIGH (ref 0.0–100.0)

## 2022-11-30 MED ORDER — ALUM & MAG HYDROXIDE-SIMETH 200-200-20 MG/5ML PO SUSP
30.0000 mL | Freq: Once | ORAL | Status: AC
  Administered 2022-11-30: 30 mL via ORAL
  Filled 2022-11-30: qty 30

## 2022-11-30 MED ORDER — LIDOCAINE VISCOUS HCL 2 % MT SOLN
15.0000 mL | Freq: Once | OROMUCOSAL | Status: AC
  Administered 2022-11-30: 15 mL via ORAL
  Filled 2022-11-30: qty 15

## 2022-11-30 NOTE — ED Notes (Signed)
Patient to X-ray

## 2022-11-30 NOTE — Discharge Instructions (Addendum)
Thank you for allowing [place] to take care of you today.  We hope you begin feeling better soon.  To-Do: Please follow-up with your primary doctor. Please return to the Emergency Department or call 911 if you experience chest pain, shortness of breath, severe pain, severe fever, altered mental status, or have any reason to think that you need emergency medical care.  Thank you again.  Hope you feel better soon.  Department of Emergency Medicine [facility]

## 2022-11-30 NOTE — ED Triage Notes (Signed)
C/O SHOB when lying flat and pain to his left arm

## 2022-11-30 NOTE — ED Provider Notes (Signed)
[place] EMERGENCY DEPARTMENT AT [facility] Provider Note  CSN: [id number] Arrival date & time: 11/30/22 2952  Chief Complaint(s) Shortness of Breath  HPI [name] is a 89 y.o. male with a past medical history listed below including chronic atrial fibrillation on Eliquis, CAD status post prior MIs and multiple stents and CABGs, CHF with a last EF of 65% in March of this year who presents to the emergency department with several hours of orthopnea.  He denied any overt chest pain.  He began having left arm soreness less than an hour ago which prompted him to call EMS for evaluation.  He denies any recent fevers or infections.  No coughing or congestion.  No nausea or vomiting.  No abdominal pain.  No lower extremity edema.  The history is provided by the patient.    Past Medical History Past Medical History:  Diagnosis Date   Abdominal pain    Acute renal failure (HCC)     resolved   Arthritis    "hands & legs" (11/'10/2014)   Ataxia    Atrial fibrillation (HCC)    Bladder outlet obstruction    Bladder outlet obstruction    BPH (benign prostatic hyperplasia)    CAD (coronary artery disease)    a. CABG IN 1989. b. 01/08/2015 CTO of ost LAD, LIMA to LAD not visualized but assumed patent given myoview finding, occluded SVG to diagonal, 99% mid RCA tx w/ SYNERGY DES 3X28 mm   Constipation    Diabetes mellitus type 2, insulin dependent (HCC)    Epistaxis, recurrent Feb 2017   GERD (gastroesophageal reflux disease)    HTN (hypertension)    Hx of bacterial pneumonia    Hyperlipemia    Kidney stones    Mild aortic stenosis    Mild cognitive impairment 03/05/2021   MMSE 26/30 and 6CIT 9 03/2021 AWV   Pneumonia 04/2019   Rib fractures    left rib fractures being treated with pain medications   Stented coronary artery Nov 2016   RCA DES   Urinary tract infection     Enterococcus   Patient Active Problem List   Diagnosis Date Noted   History of lower GI bleeding  06/10/2022   Hiatal hernia 05/05/2022   Osteoarthritis of metacarpophalangeal (MCP) joint of left thumb 01/18/2022   Mild cognitive impairment 03/05/2021   Stable hemispheric central retinal vein occlusion (CRVO) of right eye 07/21/2020   Moderate nonproliferative diabetic retinopathy of left eye (HCC) 06/18/2020   Thoracic aortic aneurysm without rupture (HCC) 05/26/2020   Stage 3b chronic kidney disease (HCC) 05/26/2020   Aortic stenosis, moderate 05/22/2019   Chronic diastolic heart failure (HCC) 03/14/2019   Lower extremity edema 03/14/2019   Spinal stenosis of lumbar region 02/21/2018   Degeneration of lumbar intervertebral disc 02/21/2018   Diabetic peripheral neuropathy associated with type 2 diabetes mellitus (HCC) 05/05/2017   Coronary artery disease involving native coronary artery of native heart without angina pectoris 06/13/2016   CVA (cerebral vascular accident) (HCC) 06/06/2016   Recurrent coronary arteriosclerosis after percutaneous transluminal coronary angioplasty 04/18/2015   Epistaxis, recurrent 04/11/2015   CAD S/P PCI- Nov 2016    Chronic vertigo 03/15/2014   Chronic anticoagulation 02/06/2013   Obesity (BMI 30.0-34.9) 01/31/2012   Osteoarthritis, knee 01/31/2012   Allergic rhinitis 10/28/2011   Chronic atrial fibrillation (HCC) 07/30/2011   Hearing loss 06/08/2011   Primary osteoarthritis of hand 06/08/2011   Enlarged prostate without lower urinary tract symptoms (luts) 01/04/2011  Gastro-esophageal reflux disease without esophagitis 12/07/2010   Hx of CABG x 2 1990    Hypertension associated with diabetes (HCC)    Diabetes mellitus with diabetic nephropathy, with long-term current use of insulin (HCC)    Combined hyperlipidemia associated with type 2 diabetes mellitus (HCC)    Benign hematuria 08/27/2008   Home Medication(s) Prior to Admission medications   Medication Sig Start Date End Date Taking? Authorizing Provider  apixaban (ELIQUIS) 5 MG TABS  tablet Take 1 tablet (5 mg total) by mouth 2 (two) times daily. 05/09/22   Marguerita Merles Latif, DO  atorvastatin (LIPITOR) 40 MG tablet Take 1 tablet (40 mg total) by mouth at bedtime. 08/16/22   [name], MD  AVODART 0.5 MG capsule Take 0.5 mg by mouth every other day. 07/20/11   [provider]  Cholecalciferol (VITAMIN D3 PO) Take 50 mcg by mouth daily.    [provider]  empagliflozin (JARDIANCE) 25 MG TABS tablet Take 1 tablet (25 mg total) by mouth daily before breakfast. 09/03/20   [place], Peter M, MD  furosemide (LASIX) 40 MG tablet TAKE 1 TABLET(40 MG) BY MOUTH DAILY 08/31/22   [place], Peter M, MD  gabapentin (NEURONTIN) 300 MG capsule Take 2 capsules (600 mg total) by mouth at bedtime. 08/16/22   [name], MD  insulin glargine (LANTUS) 100 UNIT/ML injection Inject 38 Units into the skin every morning.    [provider]  iron polysaccharides (NIFEREX) 150 MG capsule Take 1 capsule (150 mg total) by mouth daily. 05/06/22   Marguerita Merles Latif, DO  metFORMIN (GLUCOPHAGE) 500 MG tablet Take 500 mg by mouth 2 (two) times daily with a meal.    [provider]  metoprolol tartrate (LOPRESSOR) 50 MG tablet Take 1 tablet (50 mg total) by mouth 2 (two) times daily. 07/15/22   [place], Peter M, MD  pantoprazole (PROTONIX) 40 MG tablet TAKE 1 TABLET BY MOUTH DAILY AT NOON 06/03/22   Joylene Grapes, NP  traMADol (ULTRAM) 50 MG tablet Take 1 tablet (50 mg total) by mouth every 12 (twelve) hours as needed. 07/09/22   Cristie Hem, [name]  TYLENOL 500 MG tablet Take 500-1,000 mg by mouth every 6 (six) hours as needed for mild pain or headache.    [provider]                                                                                                                                    Allergies Septra [sulfamethoxazole-trimethoprim], Cefadroxil, Ciprofloxacin, and Erythromycin  Review of Systems Review of Systems As noted in HPI  Physical  Exam Vital Signs  I have reviewed the triage vital signs BP (!) 142/75   Pulse (!) 51   Temp 97.9 F (36.6 C) (Oral)   Resp 19   Ht 5\' 5"  (1.651 m)   Wt 79.8 kg   SpO2 100%   BMI 29.29  kg/m   Physical Exam Vitals reviewed.  Constitutional:      General: He is not in acute distress.    Appearance: He is well-developed. He is not diaphoretic.  HENT:     Head: Normocephalic and atraumatic.     Nose: Nose normal.  Eyes:     General: No scleral icterus.       Right eye: No discharge.        Left eye: No discharge.     Conjunctiva/sclera: Conjunctivae normal.     Pupils: Pupils are equal, round, and reactive to light.  Cardiovascular:     Rate and Rhythm: Normal rate. Rhythm irregularly irregular.     Heart sounds: No murmur heard.    No friction rub. No gallop.  Pulmonary:     Effort: Pulmonary effort is normal. No respiratory distress.     Breath sounds: Normal breath sounds. No stridor. No rales.  Abdominal:     General: There is no distension.     Palpations: Abdomen is soft.     Tenderness: There is no abdominal tenderness.  Musculoskeletal:        General: No tenderness.     Cervical back: Normal range of motion and neck supple.     Right lower leg: No edema.     Left lower leg: No edema.  Skin:    General: Skin is warm and dry.     Findings: No erythema or rash.  Neurological:     Mental Status: He is alert and oriented to person, place, and time.     ED Results and Treatments Labs (all labs ordered are listed, but only abnormal results are displayed) Labs Reviewed  CBC - Abnormal; Notable for the following components:      Result Value   Hemoglobin 10.1 (*)    HCT 35.1 (*)    MCV 76.0 (*)    MCH 21.9 (*)    MCHC 28.8 (*)    RDW 17.8 (*)    All other components within normal limits  BASIC METABOLIC PANEL - Abnormal; Notable for the following components:   GFR, Estimated 55 (*)    All other components within normal limits  BRAIN NATRIURETIC PEPTIDE  - Abnormal; Notable for the following components:   B Natriuretic Peptide 288.0 (*)    All other components within normal limits  TROPONIN I (HIGH SENSITIVITY)  TROPONIN I (HIGH SENSITIVITY)                                                                                                                         EKG  EKG Interpretation Date/Time:  Tuesday November 30 2022 05:20:50 EDT Ventricular Rate:  93 PR Interval:    QRS Duration:  97 QT Interval:  381 QTC Calculation: 474 R Axis:   84  Text Interpretation: Atrial fibrillation Ventricular premature complex Borderline right axis deviation Borderline repolarization abnormality Confirmed by Drema Pry ([id number]) on 11/30/2022 7:28:34 AM  Radiology DG Chest 2 View  Result Date: 11/30/2022 CLINICAL DATA:  Shortness of breath and chest pain. EXAM: CHEST - 2 VIEW COMPARISON:  AP and lateral study 10/20/2022 FINDINGS: There is mild cardiomegaly without evidence of CHF. There is a tortuous and moderately calcified aorta with stable mediastinum and CABG changes. There is increased opacity in the retrocardiac left lower lobe which could be due to pneumonia or aspiration. It is not well delineated on the lateral view perhaps due to motion. Rest of the lungs appear generally clear. There is no substantial pleural effusion. Osteopenia and thoracic spondylosis. IMPRESSION: Increased opacity in the retrocardiac left lower lobe, could be due to pneumonia or aspiration. Clinical correlation and radiographic follow-up recommended. Cardiomegaly with old CABG.  No findings of CHF. Heavy aortic atherosclerosis with aortic uncoiling. Electronically Signed   By: Almira Bar M.D.   On: 11/30/2022 06:04    Medications Ordered in ED Medications  alum & mag hydroxide-simeth (MAALOX/MYLANTA) 200-200-20 MG/5ML suspension 30 mL (30 mLs Oral Given 11/30/22 0535)    And  lidocaine (XYLOCAINE) 2 % viscous mouth solution 15 mL (15 mLs Oral Given 11/30/22 0535)    Procedures Procedures  (including critical care time) Medical Decision Making / ED Course   Medical Decision Making Amount and/or Complexity of Data Reviewed Labs: ordered. Decision-making details documented in ED Course. Radiology: ordered and independent interpretation performed. Decision-making details documented in ED Course. ECG/medicine tests: ordered and independent interpretation performed. Decision-making details documented in ED Course.  Risk OTC drugs. Prescription drug management.    Patient here with orthopnea and left arm discomfort  EKG without acute ischemic changes or evidence of pericarditis.  Rate controlled atrial fibrillation with intermittent PVCs.  No other dysrhythmias or blocks.  Initial troponin negative.  Delta troponin negative.  Presentation is atypical for ACS and given reassuring workup, feel this is unlikely the cause of his presentation.  Patient does not appear to be volume overloaded on exam.  Chest x-ray without evidence of pneumonia, no pneumothorax, pulmonary edema or pleural effusions.  Patient does have retrocardiac opacity which has been there for several months (noted on CTs).  BNP close to baseline and flat.  Provided with GI cocktail giving our last encounter which completely resolved his symptoms again.    Final Clinical Impression(s) / ED Diagnoses Final diagnoses:  Orthopnea   The patient appears reasonably screened and/or stabilized for discharge and I doubt any other medical condition or other [facility] requiring further screening, evaluation, or treatment in the ED at this time. I have discussed the findings, Dx and Tx plan with the patient/family who expressed understanding and agree(s) with the plan. Discharge instructions discussed at length. The patient/family was given strict return precautions who verbalized understanding of the instructions. No further questions at time of discharge.  Disposition: Discharge  Condition:  Good  ED Discharge Orders     None         Follow Up: [name], MD [address] [place] [address] [phone]  Call  to schedule an appointment for close follow up    This chart was dictated using voice recognition software.  Despite best efforts to proofread,  errors can occur which can change the documentation meaning.    Nira Conn, MD 11/30/22 [phone]

## 2022-12-07 ENCOUNTER — Encounter: Payer: Self-pay | Admitting: Family Medicine

## 2022-12-07 ENCOUNTER — Ambulatory Visit (INDEPENDENT_AMBULATORY_CARE_PROVIDER_SITE_OTHER): Payer: Medicare Other | Admitting: Family Medicine

## 2022-12-07 VITALS — BP 118/82 | HR 88 | Temp 98.2°F | Ht 65.0 in | Wt 199.6 lb

## 2022-12-07 DIAGNOSIS — Z7901 Long term (current) use of anticoagulants: Secondary | ICD-10-CM

## 2022-12-07 DIAGNOSIS — K219 Gastro-esophageal reflux disease without esophagitis: Secondary | ICD-10-CM | POA: Diagnosis not present

## 2022-12-07 DIAGNOSIS — I482 Chronic atrial fibrillation, unspecified: Secondary | ICD-10-CM | POA: Diagnosis not present

## 2022-12-07 DIAGNOSIS — R0789 Other chest pain: Secondary | ICD-10-CM

## 2022-12-07 DIAGNOSIS — I2510 Atherosclerotic heart disease of native coronary artery without angina pectoris: Secondary | ICD-10-CM | POA: Diagnosis not present

## 2022-12-07 MED ORDER — TRAMADOL HCL 50 MG PO TABS: 50.0000 mg | ORAL_TABLET | Freq: Two times a day (BID) | ORAL | 5 refills | Status: DC | PRN

## 2022-12-07 NOTE — Progress Notes (Signed)
Subjective  CC:  Chief Complaint  Patient presents with   Hospitalization Follow-up    11/30/2022 (3 hours) [facility]      HPI: [name] is a 89 y.o. male who presents to the office today to address the problems listed above in the chief complaint. ED follow-up: See above.  Evaluated for left arm pain and chest tightness.  Workup for acute cardiac syndrome or ischemia was negative.  Chest x-ray was unremarkable.  Symptoms resolved with GI cocktail.  Most likely diagnosis reflux.  Since being home, no further episodes.  Taking Mylanta twice daily.  He is on chronic Protonix.  No further chest pain, shortness of breath or left arm pain.  Blood pressure and sugars have been normal at home.  Assessment  1. Atypical chest pain   2. Chronic atrial fibrillation (HCC)   3. Coronary artery disease involving native coronary artery of native heart without angina pectoris   4. Chronic anticoagulation   5. Gastro-esophageal reflux disease without esophagitis      Plan  GERD: Fortunately, doing fine now.  May stop Mylanta.  Continue Protonix 40 daily.  Use Mylanta as needed. Cardiac: Chronic A-fib and coronary disease clinically stable.  No change in medications today.  Follow up: As scheduled 03/17/2023  No orders of the defined types were placed in this encounter.  Meds ordered this encounter  Medications   traMADol (ULTRAM) 50 MG tablet    Sig: Take 1 tablet (50 mg total) by mouth 2 (two) times daily as needed.    Dispense:  60 tablet    Refill:  5      I reviewed the patients updated PMH, FH, and SocHx.    Patient Active Problem List   Diagnosis Date Noted   History of lower GI bleeding 06/10/2022    Priority: High   Mild cognitive impairment 03/05/2021    Priority: High   Moderate nonproliferative diabetic retinopathy of left eye (HCC) 06/18/2020    Priority: High   Stage 3b chronic kidney disease (HCC)  05/26/2020    Priority: High   Aortic stenosis, moderate 05/22/2019    Priority: High   Chronic diastolic heart failure (HCC) 03/14/2019    Priority: High   Spinal stenosis of lumbar region 02/21/2018    Priority: High   Diabetic peripheral neuropathy associated with type 2 diabetes mellitus (HCC) 05/05/2017    Priority: High   Coronary artery disease involving native coronary artery of native heart without angina pectoris 06/13/2016    Priority: High   CVA (cerebral vascular accident) (HCC) 06/06/2016    Priority: High   Recurrent coronary arteriosclerosis after percutaneous transluminal coronary angioplasty 04/18/2015    Priority: High   CAD S/P PCI- Nov 2016     Priority: High   Chronic anticoagulation 02/06/2013    Priority: High   Chronic atrial fibrillation (HCC) 07/30/2011    Priority: High   Hx of CABG x 2 1990     Priority: High   Hypertension associated with diabetes (HCC)     Priority: High   Diabetes mellitus with diabetic nephropathy, with long-term current use of insulin (HCC)     Priority: High   Combined hyperlipidemia associated with type 2 diabetes mellitus (HCC)     Priority: High   Thoracic aortic aneurysm without rupture (HCC) 05/26/2020    Priority: Medium    Degeneration of lumbar intervertebral disc 02/21/2018    Priority: Medium  Chronic vertigo 03/15/2014    Priority: Medium    Obesity (BMI 30.0-34.9) 01/31/2012    Priority: Medium    Enlarged prostate without lower urinary tract symptoms (luts) 01/04/2011    Priority: Medium    Gastro-esophageal reflux disease without esophagitis 12/07/2010    Priority: Medium    Hiatal hernia 05/05/2022    Priority: Low   Osteoarthritis of metacarpophalangeal (MCP) joint of left thumb 01/18/2022    Priority: Low   Stable hemispheric central retinal vein occlusion (CRVO) of right eye 07/21/2020    Priority: Low   Lower extremity edema 03/14/2019    Priority: Low   Epistaxis, recurrent 04/11/2015     Priority: Low   Osteoarthritis, knee 01/31/2012    Priority: Low   Allergic rhinitis 10/28/2011    Priority: Low   Hearing loss 06/08/2011    Priority: Low   Primary osteoarthritis of hand 06/08/2011    Priority: Low   Benign hematuria 08/27/2008    Priority: Low   Current Meds  Medication Sig   apixaban (ELIQUIS) 5 MG TABS tablet Take 1 tablet (5 mg total) by mouth 2 (two) times daily.   atorvastatin (LIPITOR) 40 MG tablet Take 1 tablet (40 mg total) by mouth at bedtime.   AVODART 0.5 MG capsule Take 0.5 mg by mouth every other day.   Cholecalciferol (VITAMIN D3 PO) Take 50 mcg by mouth daily.   empagliflozin (JARDIANCE) 25 MG TABS tablet Take 1 tablet (25 mg total) by mouth daily before breakfast.   furosemide (LASIX) 40 MG tablet TAKE 1 TABLET(40 MG) BY MOUTH DAILY   gabapentin (NEURONTIN) 300 MG capsule Take 2 capsules (600 mg total) by mouth at bedtime.   insulin glargine (LANTUS) 100 UNIT/ML injection Inject 38 Units into the skin every morning.   iron polysaccharides (NIFEREX) 150 MG capsule Take 1 capsule (150 mg total) by mouth daily.   metFORMIN (GLUCOPHAGE) 500 MG tablet Take 500 mg by mouth 2 (two) times daily with a meal.   metoprolol tartrate (LOPRESSOR) 50 MG tablet Take 1 tablet (50 mg total) by mouth 2 (two) times daily.   pantoprazole (PROTONIX) 40 MG tablet TAKE 1 TABLET BY MOUTH DAILY AT NOON   TYLENOL 500 MG tablet Take 500-1,000 mg by mouth every 6 (six) hours as needed for mild pain or headache.   [DISCONTINUED] traMADol (ULTRAM) 50 MG tablet Take 1 tablet (50 mg total) by mouth every 12 (twelve) hours as needed.    Allergies: Patient is allergic to septra [sulfamethoxazole-trimethoprim], cefadroxil, ciprofloxacin, and erythromycin. Family History: Patient family history includes Brain cancer in his father; Throat cancer in his brother. Social History:  Patient  reports that he quit smoking about 64 years ago. His smoking use included cigarettes. He started  smoking about 74 years ago. He has a 30 pack-year smoking history. He has never used smokeless tobacco. He reports that he does not drink alcohol and does not use drugs.  Review of Systems: Constitutional: Negative for fever malaise or anorexia Cardiovascular: negative for chest pain Respiratory: negative for SOB or persistent cough Gastrointestinal: negative for abdominal pain  Objective  Vitals: BP 118/82   Pulse 88   Temp 98.2 F (36.8 C)   Ht 5\' 5"  (1.651 m)   Wt 199 lb 9.6 oz (90.5 kg)   SpO2 98%   BMI 33.22 kg/m  General: no acute distress , A&Ox3, appears well HEENT: PEERL, conjunctiva normal, neck is supple Cardiovascular: Irregularly irregular Respiratory: Normal respirations  skin:  Warm,  no rashes  Commons side effects, risks, benefits, and alternatives for medications and treatment plan prescribed today were discussed, and the patient expressed understanding of the given instructions. Patient is instructed to call or message via MyChart if he/she has any questions or concerns regarding our treatment plan. No barriers to understanding were identified. We discussed Red Flag symptoms and signs in detail. Patient expressed understanding regarding what to do in case of urgent or emergency type symptoms.  Medication list was reconciled, printed and provided to the patient in AVS. Patient instructions and summary information was reviewed with the patient as documented in the AVS. This note was prepared with assistance of Dragon voice recognition software. Occasional wrong-word or sound-a-like substitutions may have occurred due to the inherent limitations of voice recognition software

## 2022-12-09 NOTE — Progress Notes (Signed)
Cardiology Office Note   Date:  12/09/2022   ID:  [name], DOB [birth year], MRN [id number]  PCP:  [name], MD  Cardiologist:  Cobin Cadavid [place], MD EP: None  No chief complaint on file.      History of Present Illness: [name] is a 89 y.o. male with PMH of CAD s/p CABG in 1990 with subsequent DES to RCA in 2006, permanent atrial fibrillation, chronic diastolic CHF, aortic stenosis, HTN, HLD, carotid artery disease, DM type 2, CVA, who presents for  follow-up.   He was  admitted to the hospital 05/13/19-05/18/19 for acute respiratory failure 2/2 PNA and pleural effusion, managed with IV antibiotics and a thoracentesis. Cardiology followed that admission for assistance with atrial fibrillation with RVR and he was discharged home on diltiazem 360mg  and metoprolol succinate 100mg  BID for rate control, apixaban 5mg  BID for stroke ppx, and lasix 40mg  daily for CHF. Echo 05/14/19 showed EF 60-65%, moderate concentric LVH, indeterminate LV diastolic function, mild AI, moderate AS, and ascending aortic aneurysm of 4.8cm (up from 4.3cm 09/2018).   He was admitted 8/6-10/07/21 with sepsis due to PNA. Managed with antibiotics. He was readmitted on 8/12-8/14 with edema and some CHF. He was continued on therapy for PNA. Swallowing evaluation was negative for aspiration. BNP 207.5. Echocardiogram with EF 55 to 60%, no regional wall motion abnormality, increasing right ventricular pressure and volume overload.  No significant change from previous. Responded to diuretics. DC on lasix 40 mg bid.   In October he was treated for acute bronchitis.  He presented to the ED on 05/01/2022 with shortness of breath, dizziness, and presyncope. Prior to this he was following with GI for ongoing issues with GI bleeding and progressive blood loss anemia.  He was scheduled for outpatient colonoscopy.  Upon arrival to the ED he was hypotensive and bradycardic (HR in the 40s-60s). Cardiology was consulted in  the setting of bradycardia.  Toprol and diltiazem were held with improvement in HR.  Eliquis was held in the setting of GI bleed.  He tolerated reintroduction of low-dose metoprolol.  Repeat echocardiogram showed progressive aortic stenosis, moderate to severe. Endoscopy showed no significant bleeding.  He was noted to have moderate hiatal hernia.  He did have a number of colon polyps.  GI recommended resuming anticoagulation with Sunday after hospital discharge.  He was discharged home in stable condition on 05/06/2022. He was seen in our office on March 22 and was doing well. He did have Covid 19 but this was mild.    Recently he was seen in the ED for left arm pain. Some dyspnea as well. Ecg showed no change. Troponins negative. BNP no change. CXR clear. Given GI cocktail with relief. Denies any chest pain, dyspnea or palpitations. Has some neuropathy in his feet.     Past Medical History:  Diagnosis Date   Abdominal pain    Acute renal failure (HCC)     resolved   Arthritis    "hands & legs" (11/'10/2014)   Ataxia    Atrial fibrillation (HCC)    Bladder outlet obstruction    Bladder outlet obstruction    BPH (benign prostatic hyperplasia)    CAD (coronary artery disease)    a. CABG IN 1989. b. 01/08/2015 CTO of ost LAD, LIMA to LAD not visualized but assumed patent given myoview finding, occluded SVG to diagonal, 99% mid RCA tx w/ SYNERGY DES 3X28 mm   Constipation    Diabetes mellitus type 2, insulin  dependent [facility])    Epistaxis, recurrent Feb 2017   GERD (gastroesophageal reflux disease)    HTN (hypertension)    Hx of bacterial pneumonia    Hyperlipemia    Kidney stones    Mild aortic stenosis    Mild cognitive impairment 03/05/2021   MMSE 26/30 and 6CIT 9 03/2021 AWV   Pneumonia 04/2019   Rib fractures    left rib fractures being treated with pain medications   Stented coronary artery Nov 2016   RCA DES   Urinary tract infection     Enterococcus    Past Surgical History:   Procedure Laterality Date   CARDIAC CATHETERIZATION  1989   CARDIAC CATHETERIZATION N/A 01/08/2015   Procedure: Left Heart Cath and Cors/Grafts Angiography;  Surgeon: Mariame Rybolt M [place], MD;  Location: [facility] INVASIVE CV LAB;  Service: Cardiovascular;  Laterality: N/A;   CARDIAC CATHETERIZATION  01/08/2015   Procedure: Coronary Stent Intervention;  Surgeon: Ibraheem Voris M [place], MD;  Location: [facility] INVASIVE CV LAB;  Service: Cardiovascular;;   CARPAL TUNNEL RELEASE Right 08/2010   CATARACT EXTRACTION W/ INTRAOCULAR LENS  IMPLANT, BILATERAL Bilateral    COLONOSCOPY WITH PROPOFOL N/A 05/05/2022   Procedure: COLONOSCOPY WITH PROPOFOL;  Surgeon: Hilarie Fredrickson, MD;  Location: [facility];  Service: Gastroenterology;  Laterality: N/A;   CORONARY ANGIOPLASTY  01/08/15   RCA DES   CORONARY ARTERY BYPASS GRAFT  1989   "CABG X 2"   ESOPHAGOGASTRODUODENOSCOPY (EGD) WITH PROPOFOL N/A 05/05/2022   Procedure: ESOPHAGOGASTRODUODENOSCOPY (EGD) WITH PROPOFOL;  Surgeon: Hilarie Fredrickson, MD;  Location: [facility];  Service: Gastroenterology;  Laterality: N/A;   IR THORACENTESIS ASP PLEURAL SPACE W/IMG GUIDE  05/14/2019   JOINT REPLACEMENT     POLYPECTOMY  05/05/2022   Procedure: POLYPECTOMY;  Surgeon: Hilarie Fredrickson, MD;  Location: [facility];  Service: Gastroenterology;;   TONSILLECTOMY     TOTAL HIP ARTHROPLASTY Right 2000     Current Outpatient Medications  Medication Sig Dispense Refill   apixaban (ELIQUIS) 5 MG TABS tablet Take 1 tablet (5 mg total) by mouth 2 (two) times daily. 60 tablet 2   atorvastatin (LIPITOR) 40 MG tablet Take 1 tablet (40 mg total) by mouth at bedtime.     AVODART 0.5 MG capsule Take 0.5 mg by mouth every other day.     Cholecalciferol (VITAMIN D3 PO) Take 50 mcg by mouth daily.     empagliflozin (JARDIANCE) 25 MG TABS tablet Take 1 tablet (25 mg total) by mouth daily before breakfast. 30 tablet    furosemide (LASIX) 40 MG tablet TAKE 1 TABLET(40 MG) BY MOUTH DAILY 90 tablet 3   gabapentin  (NEURONTIN) 300 MG capsule Take 2 capsules (600 mg total) by mouth at bedtime. 180 capsule 5   insulin glargine (LANTUS) 100 UNIT/ML injection Inject 38 Units into the skin every morning.     iron polysaccharides (NIFEREX) 150 MG capsule Take 1 capsule (150 mg total) by mouth daily. 30 capsule 0   metFORMIN (GLUCOPHAGE) 500 MG tablet Take 500 mg by mouth 2 (two) times daily with a meal.     metoprolol tartrate (LOPRESSOR) 50 MG tablet Take 1 tablet (50 mg total) by mouth 2 (two) times daily. 180 tablet 3   pantoprazole (PROTONIX) 40 MG tablet TAKE 1 TABLET BY MOUTH DAILY AT NOON 90 tablet 3   traMADol (ULTRAM) 50 MG tablet Take 1 tablet (50 mg total) by mouth 2 (two) times daily as needed. 60 tablet 5   TYLENOL 500  MG tablet Take 500-1,000 mg by mouth every 6 (six) hours as needed for mild pain or headache.     No current facility-administered medications for this visit.    Allergies:   Septra [sulfamethoxazole-trimethoprim], Cefadroxil, Ciprofloxacin, and Erythromycin    Social History:  The patient  reports that he quit smoking about 64 years ago. His smoking use included cigarettes. He started smoking about 74 years ago. He has a 30 pack-year smoking history. He has never used smokeless tobacco. He reports that he does not drink alcohol and does not use drugs.   Family History:  The patient's family history includes Brain cancer in his father; Throat cancer in his brother.    ROS:  Please see the history of present illness.   Otherwise, review of systems are positive for none.   All other systems are reviewed and negative.    PHYSICAL EXAM: VS:  There were no vitals taken for this visit. , BMI There is no height or weight on file to calculate BMI. GEN: Well nourished, well developed, in no acute distress. Walks with cane HEENT: normal Neck: no JVD, carotid bruits, or masses Cardiac: IRIR; gr 2/6 systolic murmur RUSB>>apex, rubs, or gallops, trpedal  edema  Respiratory:  clear to  auscultation bilaterally, normal work of breathing GI: soft, nontender, nondistended, + BS MS: no deformity or atrophy. No biceps tenderness Skin: warm and dry, no rash Neuro:  Strength and sensation are intact Psych: euthymic mood, full affect   EKG Interpretation Date/Time:  Tuesday December 14 2022 15:18:18 EDT Ventricular Rate:  96 PR Interval:    QRS Duration:  86 QT Interval:  370 QTC Calculation: 467 R Axis:   15  Text Interpretation: Atrial fibrillation Nonspecific T wave abnormality Prolonged QT When compared with ECG of 30-Nov-2022 05:20, No significant change since last tracing Confirmed by [place], Naarah Borgerding [phone]) on 12/14/2022 3:32:51 PM      Recent Labs: 03/11/2022: TSH 1.81 05/14/2022: Magnesium 1.9 10/20/2022: ALT 20 11/30/2022: B Natriuretic Peptide 288.0; BUN 19; Creatinine, Ser 1.23; Hemoglobin 10.1; Platelets 253; Potassium 3.6; Sodium 142    Lipid Panel    Component Value Date/Time   CHOL 147 03/11/2022 1054   TRIG 75.0 03/11/2022 1054   HDL 52.30 03/11/2022 1054   CHOLHDL 3 03/11/2022 1054   VLDL 15.0 03/11/2022 1054   LDLCALC 80 03/11/2022 1054      Wt Readings from Last 3 Encounters:  12/07/22 199 lb 9.6 oz (90.5 kg)  11/30/22 176 lb (79.8 kg)  10/28/22 192 lb 12.8 oz (87.5 kg)      Other studies Reviewed: Additional studies/ records that were reviewed today include:   2D echo 05/14/2019 IMPRESSIONS    1. Left ventricular ejection fraction, by estimation, is 60 to 65%. The  left ventricle has normal function. The left ventricle has no regional  wall motion abnormalities. There is moderate concentric left ventricular  hypertrophy. Left ventricular  diastolic parameters are indeterminate.   2. Right ventricular systolic function is normal. The right ventricular  size is normal.   3. The mitral valve is normal in structure. No evidence of mitral valve  regurgitation. No evidence of mitral stenosis.   4. The aortic valve is normal in  structure. Aortic valve regurgitation is  mild. Moderate aortic valve stenosis. Aortic valve area, by VTI measures  1.27 cm. Aortic valve mean gradient measures 19.3 mmHg. Aortic valve Vmax  measures 2.75 m/s.   5. Compared with th echo 09/2018, ascending aorta has increased  from 4.3  cm to 4.8cm. . Aortic dilatation noted. There is moderate dilatation of  the ascending aorta measuring 48 mm.   6. The inferior vena cava is normal in size with greater than 50%  respiratory variability, suggesting right atrial pressure of 3 mmHg.   Left heart catheterization 2016: Prox RCA lesion, 40% stenosed. LM lesion, 20% stenosed. Ost LAD to Prox LAD lesion, 100% stenosed. SVG . Origin lesion, 100% stenosed. Mid RCA-2 lesion, 70% stenosed. Mid RCA-1 lesion, 99% stenosed. Post intervention, there is a 0% residual stenosis.   1. Severe 2 vessel obstructive CAD. CTO of the origin of the LAD. Critical mid RCA stenosis. 2. Occluded SVG to the diagonal 3. LIMA to the LAD was not visualized but assumed patent based on clinical history and Myoview findings. 4. Normal LV EDP. 5. Successful stenting of the Mid RCA with DES. Very difficult procedure due to vessel tortuosity.   Plan: DAPT with ASA and Plavix for one month then stop ASA and continue Plavix for at least one year. Resume Coumadin tomorrow. Will assess LV function with an Echo. Stop prilosec and start protonix. Anticipate DC in am if stable. Patient noted to be bradycardic throughout case so will reduce metoprolol to 50 mg daily.  CT CHEST WITHOUT CONTRAST   TECHNIQUE: Multidetector CT imaging of the chest was performed following the standard protocol without IV contrast.   COMPARISON:  CT angiography from June 02, 2016 and from October 03, 2017   FINDINGS: Cardiovascular: Post median sternotomy for CABG. Ascending thoracic aorta measuring 4.3 cm with stable appearance. Calcified atheromatous plaque of the thoracic aorta and its branches in  the chest.   Heart size is stable, extensive calcified coronary artery disease post CABG and LIMA grafting.   Engorged central pulmonary vasculature 3.2 cm caliber of the main pulmonary artery Limited assessment of cardiovascular structures given lack of intravenous contrast.   Mediastinum/Nodes: Thoracic inlet structures are normal. No axillary lymphadenopathy. No mediastinal lymphadenopathy. No gross hilar lymphadenopathy.   Lungs/Pleura: Bandlike opacity with mild ground-glass attenuation in the LEFT lung base. Bandlike changes are present in the LEFT lower lobe. Resolution of pleural fluid seen on previous chest x-rays from March of 2021   Pulmonary nodule in the superior segment of the RIGHT lower lobe 6 mm unchanged from previous imaging (image 49, series 3) no consolidation. No pleural effusion.   (Image 72, series 3) 4 mm nodule with some surrounding ground-glass opacity in the central LEFT lower lobe   Airways are patent.   Upper Abdomen: Incidental imaging of upper abdominal contents with signs of vascular disease of the abdominal aorta. Small hiatal hernia. No acute upper abdominal process.   Musculoskeletal: No acute bone finding. No destructive bone process.   IMPRESSION: 1. Stable appearance of the mild aneurysmal dilation ascending thoracic aorta measuring 4.3 cm. Recommend annual imaging followup by CTA or MRA. This recommendation follows 2010 ACCF/AHA/AATS/ACR/ASA/SCA/SCAI/SIR/STS/SVM Guidelines for the Diagnosis and Management of Patients with Thoracic Aortic Disease. Circulation. 2010; 121: W098-J191. Aortic aneurysm NOS (ICD10-I71.9) 2. Stable 6 mm nodule in the superior segment of the RIGHT lower lobe. 3. 4 mm nodule with some surrounding ground-glass opacity in the central LEFT lower lobe. This is new compared to prior imaging. No follow-up needed if patient is low-risk. Non-contrast chest CT can be considered in 12 months if patient is high-risk.  This recommendation follows the consensus statement: Guidelines for Management of Incidental Pulmonary Nodules Detected on CT Images: From the Fleischner Society 2017; Radiology  2017; 161:096-045. 4. Resolution of pleural fluid seen on previous chest x-rays from March of 2021. Bandlike opacity in the area of prior LEFT lower lobe airspace disease suggests mixture of atelectasis and post infectious scarring. 5. Stable signs of vascular disease of the thoracic aorta and its branches in the chest. 6. Aortic atherosclerosis.   Aortic Atherosclerosis (ICD10-I70.0).   Aortic aneurysm NOS (ICD10-I71.9).     Electronically Signed   By: Donzetta Kohut M.D.   On: 02/05/2020 08:23  Echo 06/10/20: IMPRESSIONS     1. Left ventricular ejection fraction, by estimation, is 60 to 65%. The  left ventricle has normal function. The left ventricle has no regional  wall motion abnormalities. There is moderate left ventricular hypertrophy.  Left ventricular diastolic function   could not be evaluated.   2. Right ventricular systolic function is mildly reduced. The right  ventricular size is normal. There is moderately elevated pulmonary artery  systolic pressure.   3. Left atrial size was severely dilated.   4. Right atrial size was moderately dilated.   5. The mitral valve is abnormal. Trivial mitral valve regurgitation.   6. The aortic valve is tricuspid. There is moderate calcification of the  aortic valve. Aortic valve regurgitation is trivial. Mild to moderate  aortic valve stenosis. Aortic valve area, by VTI measures 1.47 cm. Aortic  valve mean gradient measures 18.0  mmHg. Aortic valve Vmax measures 2.78 m/s. DI is 0.30.   7. Aortic dilatation noted. There is mild dilatation of the aortic root,  measuring 43 mm. There is mild dilatation of the ascending aorta,  measuring 43 mm.   8. The inferior vena cava is normal in size with greater than 50%  respiratory variability, suggesting right  atrial pressure of 3 mmHg.   Comparison(s): No significant change from prior study. 05/14/2019: LVEF  60-65%, moderate LVH, moderate AS - mean gradient 19.3 mmHg.  IMPRESSIONS     1. Left ventricular ejection fraction, by estimation, is 55 to 60%. The  left ventricle has normal function. The left ventricle has no regional  wall motion abnormalities. There is moderate concentric left ventricular  hypertrophy. Left ventricular  diastolic function could not be evaluated. There is the interventricular  septum is flattened in systole and diastole, consistent with right  ventricular pressure and volume overload.   2. Right ventricular systolic function is mildly reduced. The right  ventricular size is mildly enlarged. There is normal pulmonary artery  systolic pressure. The estimated right ventricular systolic pressure is  15.7 mmHg.   3. Left atrial size was mildly dilated.   4. The mitral valve is degenerative. Trivial mitral valve regurgitation.  No evidence of mitral stenosis.   5. The aortic valve is tricuspid. There is moderate calcification of the  aortic valve. There is moderate thickening of the aortic valve. Aortic  valve regurgitation is trivial. Mild to moderate aortic valve stenosis.  Aortic valve area, by VTI measures  1.05 cm. Aortic valve mean gradient measures 19.3 mmHg. Aortic valve Vmax  measures 2.92 m/s.   6. Asc aorta up to 40 mm on this study. 48 mm on last study. CT scan  recorded 43 mm. Would defer to cross sectional imaging (CT). There is mild  dilatation of the ascending aorta, measuring 40 mm.   7. The inferior vena cava is normal in size with greater than 50%  respiratory variability, suggesting right atrial pressure of 3 mmHg.   Comparison(s): No significant change from prior study.  Echo  10/10/21:    Echo 05/02/22: IMPRESSIONS     1. Left ventricular ejection fraction, by estimation, is 60 to 65%. The  left ventricle has normal function. The left  ventricle has no regional  wall motion abnormalities. There is mild left ventricular hypertrophy.  Left ventricular diastolic parameters  are indeterminate.   2. Right ventricular systolic function was not well visualized. The right  ventricular size is not well visualized. RV is poorly visualized but  function appears moderately reduced   3. The mitral valve is normal in structure. No evidence of mitral valve  regurgitation. No evidence of mitral stenosis.   4. Left atrial size was severely dilated.   5. Aortic dilatation noted. There is dilatation of the ascending aorta,  measuring 40 mm.   6. The aortic valve is tricuspid. There is severe calcification of the  aortic valve. Aortic valve regurgitation is mild. Moderate to severe  aortic valve stenosis. Moderate AS by gradients (MG 24 mmHg, Vmax 3.0  m/s), severe by AVA (0.7cm^2) and DI  (0.24). Low SV index (28 cc/m^2), suspect paradoxical low flow low  gradient severe AS    ASSESSMENT AND PLAN:  1. CAD s/p CABG in 1990 with DES to RCA in 2006:  - Not on aspirin given need for anticoagulation - Continue statin - Continue metoprolol - recent ED evaluation negative.   2. Permanent atrial fibrillation: HR is well controlled.  - No overt bleeding - continue metoprolol and diltiazem  - Continue eliquis for stroke ppx. (does not meet requirements for reduced dose) - had panendoscopy as noted. - last Hgb normal.  3. Acute on Chronic diastolic CHF:  - patient taking lasix every  day. - Continue metoprolol succinate - restrict salt intake - volume status is stable.   4. HTN: BP is well controlled - Continue metoprolol succinate, diltiazem, and lasix  5. Aortic stenosis: moderate on echo 05/14/19- repeat in August 2023  is unchanged.  - Continue annual surveillance echo's   6. Ascending aortic aneurysm: last echo 05/14/19 showed 4.8cm aneurysm, up from 4.3 09/2018. More recent CT 02/05/20 showed it was 4.3 cm. Stable. Stable by  recent Echo. No really a candidate for surgery.       Disposition:   FU 6 months   Signed, Breckin Savannah [place], MD  12/09/2022 1:08 PM

## 2022-12-13 ENCOUNTER — Encounter: Payer: Self-pay | Admitting: Podiatry

## 2022-12-13 ENCOUNTER — Ambulatory Visit (INDEPENDENT_AMBULATORY_CARE_PROVIDER_SITE_OTHER): Payer: Medicare Other | Admitting: Podiatry

## 2022-12-13 DIAGNOSIS — D689 Coagulation defect, unspecified: Secondary | ICD-10-CM

## 2022-12-13 DIAGNOSIS — E1142 Type 2 diabetes mellitus with diabetic polyneuropathy: Secondary | ICD-10-CM

## 2022-12-13 DIAGNOSIS — B351 Tinea unguium: Secondary | ICD-10-CM

## 2022-12-13 DIAGNOSIS — M79674 Pain in right toe(s): Secondary | ICD-10-CM | POA: Diagnosis not present

## 2022-12-13 NOTE — Progress Notes (Signed)
This patient returns to my office for at risk foot care.  This patient requires this care by a professional since this patient will be at risk due to having diabetic neuropathy and coagulation defect.  Patient is taking eliquiss.  Patient says toenails are painful walking and wearing his shoes.  This patient presents for at risk foot care today. ? ?General Appearance  Alert, conversant and in no acute stress. ? ?Vascular  Dorsalis pedis and posterior tibial  pulses are weakly  palpable  bilaterally.  Capillary return is within normal limits  bilaterally. Temperature is within normal limits  bilaterally. ? ?Neurologic  Senn-Weinstein monofilament wire test within normal limits  bilaterally. Muscle power within normal limits bilaterally. ? ?Nails Thick disfigured discolored nails with subungual debris  from hallux to fifth toes bilaterally. No evidence of bacterial infection or drainage bilaterally. ? ?Orthopedic  No limitations of motion  feet .  No crepitus or effusions noted.  No bony pathology or digital deformities noted. ? ?Skin  normotropic skin with no porokeratosis noted bilaterally.  No signs of infections or ulcers noted.    ? ?Onychomycosis  B/L ? ?Consent was obtained for treatment procedures.   Debridement of nails with nail nipper followed by dremel tool. Filed with dremel without incident.  ? ? ?Return office visit    10   weeks                Told patient to return for periodic foot care and evaluation due to potential at risk complications. ? ? ?Helane Gunther DPM  ?

## 2022-12-14 ENCOUNTER — Ambulatory Visit: Payer: Medicare Other | Attending: Cardiology | Admitting: Cardiology

## 2022-12-14 ENCOUNTER — Encounter: Payer: Self-pay | Admitting: Cardiology

## 2022-12-14 VITALS — BP 108/64 | HR 96 | Ht 65.0 in | Wt 186.0 lb

## 2022-12-14 DIAGNOSIS — I482 Chronic atrial fibrillation, unspecified: Secondary | ICD-10-CM | POA: Insufficient documentation

## 2022-12-14 DIAGNOSIS — I2510 Atherosclerotic heart disease of native coronary artery without angina pectoris: Secondary | ICD-10-CM | POA: Diagnosis not present

## 2022-12-14 DIAGNOSIS — I10 Essential (primary) hypertension: Secondary | ICD-10-CM | POA: Insufficient documentation

## 2022-12-14 DIAGNOSIS — I5032 Chronic diastolic (congestive) heart failure: Secondary | ICD-10-CM | POA: Insufficient documentation

## 2022-12-14 DIAGNOSIS — I350 Nonrheumatic aortic (valve) stenosis: Secondary | ICD-10-CM | POA: Insufficient documentation

## 2022-12-14 NOTE — Patient Instructions (Signed)
Medication Instructions:  Continue current medications *If you need a refill on your cardiac medications before your next appointment, please call your pharmacy*   Lab Work: none If you have labs (blood work) drawn today and your tests are completely normal, you will receive your results only by: MyChart Message (if you have MyChart) OR A paper copy in the mail If you have any lab test that is abnormal or we need to change your treatment, we will call you to review the results.   Testing/Procedures: none   Follow-Up: At [facility], you and your health needs are our priority.  As part of our continuing mission to provide you with exceptional heart care, we have created designated Provider Care Teams.  These Care Teams include your primary Cardiologist (physician) and Advanced Practice Providers (APPs -  Physician Assistants and Nurse Practitioners) who all work together to provide you with the care you need, when you need it.  Your next appointment:   6 month(s). Call office in Jan 2025 to schedule appt for Oct 2025  Provider:   Peter [place], MD

## 2022-12-17 ENCOUNTER — Telehealth: Payer: Self-pay | Admitting: Family Medicine

## 2022-12-17 NOTE — Telephone Encounter (Signed)
Error

## 2023-01-07 DIAGNOSIS — M62838 Other muscle spasm: Secondary | ICD-10-CM | POA: Diagnosis not present

## 2023-01-09 ENCOUNTER — Emergency Department (HOSPITAL_COMMUNITY): Payer: Medicare Other

## 2023-01-09 ENCOUNTER — Other Ambulatory Visit: Payer: Self-pay

## 2023-01-09 ENCOUNTER — Inpatient Hospital Stay (HOSPITAL_COMMUNITY): Payer: Medicare Other

## 2023-01-09 ENCOUNTER — Inpatient Hospital Stay (HOSPITAL_COMMUNITY)
Admission: EM | Admit: 2023-01-09 | Discharge: 2023-01-15 | DRG: 871 | Disposition: A | Discharge status: Home / Self Care | Payer: Medicare Other | Attending: Internal Medicine | Admitting: Internal Medicine

## 2023-01-09 ENCOUNTER — Encounter (HOSPITAL_COMMUNITY): Payer: Self-pay

## 2023-01-09 DIAGNOSIS — Z955 Presence of coronary angioplasty implant and graft: Secondary | ICD-10-CM

## 2023-01-09 DIAGNOSIS — I7121 Aneurysm of the ascending aorta, without rupture: Secondary | ICD-10-CM | POA: Diagnosis present

## 2023-01-09 DIAGNOSIS — Z6833 Body mass index (BMI) 33.0-33.9, adult: Secondary | ICD-10-CM

## 2023-01-09 DIAGNOSIS — D509 Iron deficiency anemia, unspecified: Secondary | ICD-10-CM | POA: Diagnosis present

## 2023-01-09 DIAGNOSIS — E1121 Type 2 diabetes mellitus with diabetic nephropathy: Secondary | ICD-10-CM

## 2023-01-09 DIAGNOSIS — E1165 Type 2 diabetes mellitus with hyperglycemia: Secondary | ICD-10-CM | POA: Diagnosis present

## 2023-01-09 DIAGNOSIS — Z888 Allergy status to other drugs, medicaments and biological substances status: Secondary | ICD-10-CM

## 2023-01-09 DIAGNOSIS — E871 Hypo-osmolality and hyponatremia: Secondary | ICD-10-CM | POA: Diagnosis not present

## 2023-01-09 DIAGNOSIS — J189 Pneumonia, unspecified organism: Principal | ICD-10-CM | POA: Diagnosis present

## 2023-01-09 DIAGNOSIS — I482 Chronic atrial fibrillation, unspecified: Secondary | ICD-10-CM | POA: Diagnosis present

## 2023-01-09 DIAGNOSIS — I5032 Chronic diastolic (congestive) heart failure: Secondary | ICD-10-CM | POA: Diagnosis not present

## 2023-01-09 DIAGNOSIS — E785 Hyperlipidemia, unspecified: Secondary | ICD-10-CM | POA: Diagnosis present

## 2023-01-09 DIAGNOSIS — Z794 Long term (current) use of insulin: Secondary | ICD-10-CM

## 2023-01-09 DIAGNOSIS — M1612 Unilateral primary osteoarthritis, left hip: Secondary | ICD-10-CM | POA: Diagnosis not present

## 2023-01-09 DIAGNOSIS — Z1152 Encounter for screening for COVID-19: Secondary | ICD-10-CM | POA: Diagnosis not present

## 2023-01-09 DIAGNOSIS — Z781 Physical restraint status: Secondary | ICD-10-CM

## 2023-01-09 DIAGNOSIS — J9601 Acute respiratory failure with hypoxia: Secondary | ICD-10-CM | POA: Diagnosis present

## 2023-01-09 DIAGNOSIS — F05 Delirium due to known physiological condition: Secondary | ICD-10-CM | POA: Diagnosis not present

## 2023-01-09 DIAGNOSIS — A419 Sepsis, unspecified organism: Secondary | ICD-10-CM | POA: Diagnosis not present

## 2023-01-09 DIAGNOSIS — E1122 Type 2 diabetes mellitus with diabetic chronic kidney disease: Secondary | ICD-10-CM | POA: Diagnosis present

## 2023-01-09 DIAGNOSIS — Z7901 Long term (current) use of anticoagulants: Secondary | ICD-10-CM

## 2023-01-09 DIAGNOSIS — K219 Gastro-esophageal reflux disease without esophagitis: Secondary | ICD-10-CM | POA: Diagnosis present

## 2023-01-09 DIAGNOSIS — Z79899 Other long term (current) drug therapy: Secondary | ICD-10-CM

## 2023-01-09 DIAGNOSIS — R0989 Other specified symptoms and signs involving the circulatory and respiratory systems: Secondary | ICD-10-CM | POA: Diagnosis not present

## 2023-01-09 DIAGNOSIS — D539 Nutritional anemia, unspecified: Secondary | ICD-10-CM | POA: Diagnosis not present

## 2023-01-09 DIAGNOSIS — R531 Weakness: Secondary | ICD-10-CM | POA: Diagnosis not present

## 2023-01-09 DIAGNOSIS — T380X5A Adverse effect of glucocorticoids and synthetic analogues, initial encounter: Secondary | ICD-10-CM | POA: Diagnosis not present

## 2023-01-09 DIAGNOSIS — R188 Other ascites: Secondary | ICD-10-CM | POA: Diagnosis not present

## 2023-01-09 DIAGNOSIS — G8929 Other chronic pain: Secondary | ICD-10-CM | POA: Diagnosis present

## 2023-01-09 DIAGNOSIS — Z951 Presence of aortocoronary bypass graft: Secondary | ICD-10-CM | POA: Diagnosis not present

## 2023-01-09 DIAGNOSIS — I152 Hypertension secondary to endocrine disorders: Secondary | ICD-10-CM | POA: Diagnosis present

## 2023-01-09 DIAGNOSIS — I2510 Atherosclerotic heart disease of native coronary artery without angina pectoris: Secondary | ICD-10-CM | POA: Diagnosis present

## 2023-01-09 DIAGNOSIS — N200 Calculus of kidney: Secondary | ICD-10-CM | POA: Diagnosis not present

## 2023-01-09 DIAGNOSIS — N400 Enlarged prostate without lower urinary tract symptoms: Secondary | ICD-10-CM | POA: Diagnosis present

## 2023-01-09 DIAGNOSIS — I4821 Permanent atrial fibrillation: Secondary | ICD-10-CM | POA: Diagnosis present

## 2023-01-09 DIAGNOSIS — E861 Hypovolemia: Secondary | ICD-10-CM | POA: Diagnosis present

## 2023-01-09 DIAGNOSIS — Z7984 Long term (current) use of oral hypoglycemic drugs: Secondary | ICD-10-CM

## 2023-01-09 DIAGNOSIS — T888XXA Other specified complications of surgical and medical care, not elsewhere classified, initial encounter: Secondary | ICD-10-CM | POA: Diagnosis present

## 2023-01-09 DIAGNOSIS — G9341 Metabolic encephalopathy: Secondary | ICD-10-CM | POA: Diagnosis not present

## 2023-01-09 DIAGNOSIS — M25551 Pain in right hip: Secondary | ICD-10-CM | POA: Diagnosis not present

## 2023-01-09 DIAGNOSIS — Z8673 Personal history of transient ischemic attack (TIA), and cerebral infarction without residual deficits: Secondary | ICD-10-CM

## 2023-01-09 DIAGNOSIS — Z791 Long term (current) use of non-steroidal anti-inflammatories (NSAID): Secondary | ICD-10-CM

## 2023-01-09 DIAGNOSIS — J9811 Atelectasis: Secondary | ICD-10-CM | POA: Diagnosis not present

## 2023-01-09 DIAGNOSIS — I350 Nonrheumatic aortic (valve) stenosis: Secondary | ICD-10-CM | POA: Diagnosis not present

## 2023-01-09 DIAGNOSIS — M25451 Effusion, right hip: Secondary | ICD-10-CM | POA: Diagnosis not present

## 2023-01-09 DIAGNOSIS — Z96641 Presence of right artificial hip joint: Secondary | ICD-10-CM | POA: Diagnosis present

## 2023-01-09 DIAGNOSIS — I959 Hypotension, unspecified: Secondary | ICD-10-CM | POA: Diagnosis not present

## 2023-01-09 DIAGNOSIS — I4891 Unspecified atrial fibrillation: Secondary | ICD-10-CM | POA: Diagnosis not present

## 2023-01-09 DIAGNOSIS — Z961 Presence of intraocular lens: Secondary | ICD-10-CM | POA: Diagnosis present

## 2023-01-09 DIAGNOSIS — E66811 Obesity, class 1: Secondary | ICD-10-CM | POA: Diagnosis present

## 2023-01-09 DIAGNOSIS — R918 Other nonspecific abnormal finding of lung field: Secondary | ICD-10-CM | POA: Diagnosis not present

## 2023-01-09 DIAGNOSIS — R000 Tachycardia, unspecified: Secondary | ICD-10-CM | POA: Diagnosis not present

## 2023-01-09 DIAGNOSIS — E876 Hypokalemia: Secondary | ICD-10-CM | POA: Diagnosis not present

## 2023-01-09 DIAGNOSIS — I130 Hypertensive heart and chronic kidney disease with heart failure and stage 1 through stage 4 chronic kidney disease, or unspecified chronic kidney disease: Secondary | ICD-10-CM | POA: Diagnosis present

## 2023-01-09 DIAGNOSIS — Z9841 Cataract extraction status, right eye: Secondary | ICD-10-CM

## 2023-01-09 DIAGNOSIS — I499 Cardiac arrhythmia, unspecified: Secondary | ICD-10-CM | POA: Diagnosis not present

## 2023-01-09 DIAGNOSIS — K8020 Calculus of gallbladder without cholecystitis without obstruction: Secondary | ICD-10-CM | POA: Diagnosis not present

## 2023-01-09 DIAGNOSIS — I700 Atherosclerosis of aorta: Secondary | ICD-10-CM | POA: Diagnosis not present

## 2023-01-09 DIAGNOSIS — Z87891 Personal history of nicotine dependence: Secondary | ICD-10-CM

## 2023-01-09 DIAGNOSIS — D72829 Elevated white blood cell count, unspecified: Secondary | ICD-10-CM | POA: Diagnosis not present

## 2023-01-09 DIAGNOSIS — E1159 Type 2 diabetes mellitus with other circulatory complications: Secondary | ICD-10-CM | POA: Diagnosis present

## 2023-01-09 DIAGNOSIS — I493 Ventricular premature depolarization: Secondary | ICD-10-CM | POA: Diagnosis present

## 2023-01-09 DIAGNOSIS — I6782 Cerebral ischemia: Secondary | ICD-10-CM | POA: Diagnosis not present

## 2023-01-09 DIAGNOSIS — N1831 Chronic kidney disease, stage 3a: Secondary | ICD-10-CM | POA: Diagnosis present

## 2023-01-09 DIAGNOSIS — Z9842 Cataract extraction status, left eye: Secondary | ICD-10-CM

## 2023-01-09 DIAGNOSIS — Z881 Allergy status to other antibiotic agents status: Secondary | ICD-10-CM

## 2023-01-09 DIAGNOSIS — I517 Cardiomegaly: Secondary | ICD-10-CM | POA: Diagnosis not present

## 2023-01-09 HISTORY — DX: Thoracic aortic aneurysm, without rupture, unspecified: I71.20

## 2023-01-09 HISTORY — DX: Chronic diastolic (congestive) heart failure: I50.32

## 2023-01-09 HISTORY — DX: Permanent atrial fibrillation: I48.21

## 2023-01-09 HISTORY — DX: Nonrheumatic aortic (valve) stenosis: I35.0

## 2023-01-09 LAB — COMPREHENSIVE METABOLIC PANEL
ALT: 14 U/L (ref 0–44)
AST: 18 U/L (ref 15–41)
Albumin: 3.4 g/dL — ABNORMAL LOW (ref 3.5–5.0)
Alkaline Phosphatase: 62 U/L (ref 38–126)
Anion gap: 12 (ref 5–15)
BUN: 24 mg/dL — ABNORMAL HIGH (ref 8–23)
CO2: 23 mmol/L (ref 22–32)
Calcium: 8.9 mg/dL (ref 8.9–10.3)
Chloride: 94 mmol/L — ABNORMAL LOW (ref 98–111)
Creatinine, Ser: 1.29 mg/dL — ABNORMAL HIGH (ref 0.61–1.24)
GFR, Estimated: 52 mL/min — ABNORMAL LOW (ref >60)
Glucose, Bld: 218 mg/dL — ABNORMAL HIGH (ref 70–99)
Potassium: 3.5 mmol/L (ref 3.5–5.1)
Sodium: 129 mmol/L — ABNORMAL LOW (ref 135–145)
Total Bilirubin: 1.5 mg/dL — ABNORMAL HIGH (ref <1.2)
Total Protein: 7.1 g/dL (ref 6.5–8.1)

## 2023-01-09 LAB — MRSA NEXT GEN BY PCR, NASAL: MRSA by PCR Next Gen: NOT DETECTED

## 2023-01-09 LAB — APTT: aPTT: 39 s — ABNORMAL HIGH (ref 24–36)

## 2023-01-09 LAB — RESP PANEL BY RT-PCR (RSV, FLU A&B, COVID)  RVPGX2
Influenza A by PCR: NEGATIVE
Influenza B by PCR: NEGATIVE
Resp Syncytial Virus by PCR: NEGATIVE
SARS Coronavirus 2 by RT PCR: NEGATIVE

## 2023-01-09 LAB — I-STAT CHEM 8, ED
BUN: 22 mg/dL (ref 8–23)
Calcium, Ion: 1.15 mmol/L (ref 1.15–1.40)
Chloride: 95 mmol/L — ABNORMAL LOW (ref 98–111)
Creatinine, Ser: 1.4 mg/dL — ABNORMAL HIGH (ref 0.61–1.24)
Glucose, Bld: 207 mg/dL — ABNORMAL HIGH (ref 70–99)
HCT: 36 % — ABNORMAL LOW (ref 39.0–52.0)
Hemoglobin: 12.2 g/dL — ABNORMAL LOW (ref 13.0–17.0)
Potassium: 3.5 mmol/L (ref 3.5–5.1)
Sodium: 133 mmol/L — ABNORMAL LOW (ref 135–145)
TCO2: 23 mmol/L (ref 22–32)

## 2023-01-09 LAB — URINALYSIS, W/ REFLEX TO CULTURE (INFECTION SUSPECTED)
Bacteria, UA: NONE SEEN
Bilirubin Urine: NEGATIVE
Glucose, UA: >=500 mg/dL — AB
Ketones, ur: 5 mg/dL — AB
Leukocytes,Ua: NEGATIVE
Nitrite: NEGATIVE
Protein, ur: NEGATIVE mg/dL
Specific Gravity, Urine: 1.018 (ref 1.005–1.030)
pH: 5 (ref 5.0–8.0)

## 2023-01-09 LAB — CBC WITH DIFFERENTIAL/PLATELET
Abs Immature Granulocytes: 0.24 10*3/uL — ABNORMAL HIGH (ref 0.00–0.07)
Basophils Absolute: 0.1 10*3/uL (ref 0.0–0.1)
Basophils Relative: 0 %
Eosinophils Absolute: 0 10*3/uL (ref 0.0–0.5)
Eosinophils Relative: 0 %
HCT: 33.6 % — ABNORMAL LOW (ref 39.0–52.0)
Hemoglobin: 10.4 g/dL — ABNORMAL LOW (ref 13.0–17.0)
Immature Granulocytes: 1 %
Lymphocytes Relative: 6 %
Lymphs Abs: 1.3 10*3/uL (ref 0.7–4.0)
MCH: 23.1 pg — ABNORMAL LOW (ref 26.0–34.0)
MCHC: 31 g/dL (ref 30.0–36.0)
MCV: 74.7 fL — ABNORMAL LOW (ref 80.0–100.0)
Monocytes Absolute: 1.9 10*3/uL — ABNORMAL HIGH (ref 0.1–1.0)
Monocytes Relative: 9 %
Neutro Abs: 18.9 10*3/uL — ABNORMAL HIGH (ref 1.7–7.7)
Neutrophils Relative %: 84 %
Platelets: 255 10*3/uL (ref 150–400)
RBC: 4.5 MIL/uL (ref 4.22–5.81)
RDW: 17.7 % — ABNORMAL HIGH (ref 11.5–15.5)
WBC: 22.4 10*3/uL — ABNORMAL HIGH (ref 4.0–10.5)
nRBC: 0 % (ref 0.0–0.2)

## 2023-01-09 LAB — GLUCOSE, CAPILLARY: Glucose-Capillary: 150 mg/dL — ABNORMAL HIGH (ref 70–99)

## 2023-01-09 LAB — PROTIME-INR
INR: 1.5 — ABNORMAL HIGH (ref 0.8–1.2)
Prothrombin Time: 18.7 s — ABNORMAL HIGH (ref 11.4–15.2)

## 2023-01-09 LAB — I-STAT CG4 LACTIC ACID, ED: Lactic Acid, Venous: 1.6 mmol/L (ref 0.5–1.9)

## 2023-01-09 MED ORDER — INSULIN ASPART 100 UNIT/ML IJ SOLN
0.0000 [IU] | Freq: Three times a day (TID) | INTRAMUSCULAR | Status: DC
  Administered 2023-01-10 (×2): 2 [IU] via SUBCUTANEOUS
  Administered 2023-01-10: 1 [IU] via SUBCUTANEOUS
  Administered 2023-01-11: 2 [IU] via SUBCUTANEOUS
  Administered 2023-01-11: 1 [IU] via SUBCUTANEOUS
  Administered 2023-01-11 – 2023-01-12 (×2): 3 [IU] via SUBCUTANEOUS
  Administered 2023-01-12: 2 [IU] via SUBCUTANEOUS
  Administered 2023-01-12: 1 [IU] via SUBCUTANEOUS
  Administered 2023-01-13: 3 [IU] via SUBCUTANEOUS
  Administered 2023-01-13: 7 [IU] via SUBCUTANEOUS
  Administered 2023-01-13: 5 [IU] via SUBCUTANEOUS
  Administered 2023-01-14: 7 [IU] via SUBCUTANEOUS
  Administered 2023-01-14: 9 [IU] via SUBCUTANEOUS
  Administered 2023-01-14: 3 [IU] via SUBCUTANEOUS
  Filled 2023-01-09: qty 0.09

## 2023-01-09 MED ORDER — TAMSULOSIN HCL 0.4 MG PO CAPS
0.4000 mg | ORAL_CAPSULE | ORAL | Status: DC
  Administered 2023-01-11 – 2023-01-15 (×3): 0.4 mg via ORAL
  Filled 2023-01-09 (×3): qty 1

## 2023-01-09 MED ORDER — SODIUM CHLORIDE 0.9 % IV SOLN
2.0000 g | Freq: Once | INTRAVENOUS | Status: AC
  Administered 2023-01-09: 2 g via INTRAVENOUS
  Filled 2023-01-09: qty 12.5

## 2023-01-09 MED ORDER — VANCOMYCIN HCL IN DEXTROSE 1-5 GM/200ML-% IV SOLN
1000.0000 mg | Freq: Once | INTRAVENOUS | Status: DC
  Filled 2023-01-09: qty 200

## 2023-01-09 MED ORDER — SODIUM CHLORIDE 0.9 % IV SOLN
2.0000 g | INTRAVENOUS | Status: DC
  Administered 2023-01-10 – 2023-01-12 (×3): 2 g via INTRAVENOUS
  Filled 2023-01-09 (×3): qty 20

## 2023-01-09 MED ORDER — ACETAMINOPHEN 500 MG PO TABS
1000.0000 mg | ORAL_TABLET | Freq: Once | ORAL | Status: AC
  Administered 2023-01-09: 1000 mg via ORAL
  Filled 2023-01-09: qty 2

## 2023-01-09 MED ORDER — SENNOSIDES-DOCUSATE SODIUM 8.6-50 MG PO TABS: 1.0000 | ORAL_TABLET | Freq: Every evening | ORAL | Status: DC | PRN

## 2023-01-09 MED ORDER — LACTATED RINGERS IV BOLUS (SEPSIS)
1000.0000 mL | Freq: Once | INTRAVENOUS | Status: AC
  Administered 2023-01-09: 1000 mL via INTRAVENOUS

## 2023-01-09 MED ORDER — INSULIN GLARGINE-YFGN 100 UNIT/ML ~~LOC~~ SOLN
20.0000 [IU] | Freq: Every day | SUBCUTANEOUS | Status: DC
  Administered 2023-01-10 – 2023-01-13 (×4): 20 [IU] via SUBCUTANEOUS
  Filled 2023-01-09 (×4): qty 0.2

## 2023-01-09 MED ORDER — DUTASTERIDE 0.5 MG PO CAPS
0.5000 mg | ORAL_CAPSULE | ORAL | Status: DC
  Administered 2023-01-11 – 2023-01-15 (×3): 0.5 mg via ORAL
  Filled 2023-01-09 (×3): qty 1

## 2023-01-09 MED ORDER — SODIUM CHLORIDE 0.9% FLUSH
3.0000 mL | Freq: Two times a day (BID) | INTRAVENOUS | Status: DC
  Administered 2023-01-09 – 2023-01-15 (×11): 3 mL via INTRAVENOUS

## 2023-01-09 MED ORDER — PANTOPRAZOLE SODIUM 40 MG PO TBEC
40.0000 mg | DELAYED_RELEASE_TABLET | Freq: Every day | ORAL | Status: DC
  Administered 2023-01-10 – 2023-01-15 (×6): 40 mg via ORAL
  Filled 2023-01-09 (×6): qty 1

## 2023-01-09 MED ORDER — AZITHROMYCIN 500 MG IV SOLR
500.0000 mg | INTRAVENOUS | Status: DC
  Administered 2023-01-10 – 2023-01-11 (×3): 500 mg via INTRAVENOUS
  Filled 2023-01-09 (×3): qty 5

## 2023-01-09 MED ORDER — CHLORHEXIDINE GLUCONATE CLOTH 2 % EX PADS
6.0000 | MEDICATED_PAD | Freq: Every day | CUTANEOUS | Status: DC
  Administered 2023-01-09 – 2023-01-14 (×5): 6 via TOPICAL

## 2023-01-09 MED ORDER — ACETAMINOPHEN 325 MG PO TABS: 650.0000 mg | ORAL_TABLET | Freq: Four times a day (QID) | ORAL | Status: DC | PRN

## 2023-01-09 MED ORDER — VANCOMYCIN HCL 1750 MG/350ML IV SOLN
1750.0000 mg | Freq: Once | INTRAVENOUS | Status: AC
  Administered 2023-01-09: 1750 mg via INTRAVENOUS
  Filled 2023-01-09: qty 350

## 2023-01-09 MED ORDER — ACETAMINOPHEN 650 MG RE SUPP: 650.0000 mg | Freq: Four times a day (QID) | RECTAL | Status: DC | PRN

## 2023-01-09 MED ORDER — ONDANSETRON HCL 4 MG/2ML IJ SOLN: 4.0000 mg | Freq: Four times a day (QID) | INTRAMUSCULAR | Status: DC | PRN

## 2023-01-09 MED ORDER — APIXABAN 5 MG PO TABS: 5.0000 mg | ORAL_TABLET | Freq: Two times a day (BID) | ORAL | Status: DC

## 2023-01-09 MED ORDER — INSULIN ASPART 100 UNIT/ML IJ SOLN
0.0000 [IU] | Freq: Every day | INTRAMUSCULAR | Status: DC
  Administered 2023-01-12: 3 [IU] via SUBCUTANEOUS
  Filled 2023-01-09: qty 0.05

## 2023-01-09 MED ORDER — METRONIDAZOLE 500 MG/100ML IV SOLN
500.0000 mg | Freq: Once | INTRAVENOUS | Status: AC
  Administered 2023-01-09: 500 mg via INTRAVENOUS
  Filled 2023-01-09: qty 100

## 2023-01-09 MED ORDER — ONDANSETRON HCL 4 MG PO TABS: 4.0000 mg | ORAL_TABLET | Freq: Four times a day (QID) | ORAL | Status: DC | PRN

## 2023-01-09 MED ORDER — METOPROLOL TARTRATE 25 MG PO TABS
50.0000 mg | ORAL_TABLET | Freq: Two times a day (BID) | ORAL | Status: DC
  Administered 2023-01-09 – 2023-01-10 (×3): 50 mg via ORAL
  Filled 2023-01-09 (×3): qty 2

## 2023-01-09 MED ORDER — IOHEXOL 300 MG/ML  SOLN
100.0000 mL | Freq: Once | INTRAMUSCULAR | Status: AC | PRN
  Administered 2023-01-09: 100 mL via INTRAVENOUS

## 2023-01-09 NOTE — H&P (Signed)
History and Physical    Raimond Medcalf HQI:[id number] DOB: [birth year] DOA: 01/09/2023  PCP: [name], MD   Patient coming from: home   Chief Complaint: Fatigue, malaise   HPI: Leonell Volz is a 89 y.o. male with medical history significant for hypertension, insulin-dependent diabetes mellitus, CAD status post CABG in 1989, atrial fibrillation on Eliquis, chronic HFpEF, and mild cognitive impairment who presents to the emergency department with fatigue and general malaise.  3 or 4 days ago, patient began developing loss of appetite, fatigue, trouble sleeping, and general sense of unwellness.  He has not felt particularly short of breath and has not been coughing much.  He denies abdominal pain, nausea, vomiting, diarrhea, dysuria, rash, or wounds.  He has chronic right hip pain for which he receives injections periodically; this has not been any worse than usual recently.  ED Course: Upon arrival to the ED, patient is found to be febrile to 39.4 C and saturating in the 80s on room air with elevated heart rate and systolic blood pressure in the 100s.  EKG demonstrates atrial fibrillation with rate 143.  Chest x-ray is concerning for pneumonia.  CT of the abdomen pelvis reveals multifocal pneumonia, large periprosthetic right hip fluid collection, and unchanged acetabular lesion.  Labs are most notable for sodium 129, glucose 218, creatinine 1.29, WBC 22,400, and normal lactic acid.  Blood cultures were collected in the ED, orthopedic surgery (Dr. Susa Simmonds) was consulted, and the patient was treated with supplemental oxygen, acetaminophen, 1 L LR, vancomycin, cefepime, and Flagyl.  Review of Systems:  All other systems reviewed and apart from HPI, are negative.  Past Medical History:  Diagnosis Date   Abdominal pain    Acute renal failure (HCC)     resolved   Arthritis    "hands & legs" (11/'10/2014)   Ataxia    Atrial fibrillation (HCC)    Bladder outlet obstruction    Bladder  outlet obstruction    BPH (benign prostatic hyperplasia)    CAD (coronary artery disease)    a. CABG IN 1989. b. 01/08/2015 CTO of ost LAD, LIMA to LAD not visualized but assumed patent given myoview finding, occluded SVG to diagonal, 99% mid RCA tx w/ SYNERGY DES 3X28 mm   Constipation    Diabetes mellitus type 2, insulin dependent (HCC)    Epistaxis, recurrent Feb 2017   GERD (gastroesophageal reflux disease)    HTN (hypertension)    Hx of bacterial pneumonia    Hyperlipemia    Kidney stones    Mild aortic stenosis    Mild cognitive impairment 03/05/2021   MMSE 26/30 and 6CIT 9 03/2021 AWV   Pneumonia 04/2019   Rib fractures    left rib fractures being treated with pain medications   Stented coronary artery Nov 2016   RCA DES   Urinary tract infection     Enterococcus    Past Surgical History:  Procedure Laterality Date   CARDIAC CATHETERIZATION  1989   CARDIAC CATHETERIZATION N/A 01/08/2015   Procedure: Left Heart Cath and Cors/Grafts Angiography;  Surgeon: Peter M Swaziland, MD;  Location: [facility] INVASIVE CV LAB;  Service: Cardiovascular;  Laterality: N/A;   CARDIAC CATHETERIZATION  01/08/2015   Procedure: Coronary Stent Intervention;  Surgeon: Peter M Swaziland, MD;  Location[facility] INVASIVE CV LAB;  Service: Cardiovascular;;   CARPAL TUNNEL RELEASE Right 08/2010   CATARACT EXTRACTION W/ INTRAOCULAR LENS  IMPLANT, BILATERAL Bilateral    COLONOSCOPY WITH PROPOFOL N/A 05/05/2022   Procedure: COLONOSCOPY  WITH PROPOFOL;  Surgeon: Hilarie Fredrickson, MD;  Location: [facility];  Service: Gastroenterology;  Laterality: N/A;   CORONARY ANGIOPLASTY  01/08/15   RCA DES   CORONARY ARTERY BYPASS GRAFT  1989   "CABG X 2"   ESOPHAGOGASTRODUODENOSCOPY (EGD) WITH PROPOFOL N/A 05/05/2022   Procedure: ESOPHAGOGASTRODUODENOSCOPY (EGD) WITH PROPOFOL;  Surgeon: Hilarie Fredrickson, MD;  Location: [facility] ENDOSCOPY;  Service: Gastroenterology;  Laterality: N/A;   IR THORACENTESIS ASP PLEURAL SPACE W/IMG GUIDE  05/14/2019   JOINT  REPLACEMENT     POLYPECTOMY  05/05/2022   Procedure: POLYPECTOMY;  Surgeon: Hilarie Fredrickson, MD;  Location: [facility];  Service: Gastroenterology;;   TONSILLECTOMY     TOTAL HIP ARTHROPLASTY Right 2000    Social History:   reports that he quit smoking about 64 years ago. His smoking use included cigarettes. He started smoking about 74 years ago. He has a 30 pack-year smoking history. He has never used smokeless tobacco. He reports that he does not drink alcohol and does not use drugs.  Allergies  Allergen Reactions   Septra [Sulfamethoxazole-Trimethoprim] Itching   Cefadroxil Other (See Comments)    Unknown reaction    Ciprofloxacin Other (See Comments)    Unknown action     Erythromycin Other (See Comments)    Upsets the stomach     Family History  Problem Relation Age of Onset   Brain cancer Father    Throat cancer Brother    Liver disease Neg Hx    Colon cancer Neg Hx    Esophageal cancer Neg Hx      Prior to Admission medications   Medication Sig Start Date End Date Taking? Authorizing Provider  apixaban (ELIQUIS) 5 MG TABS tablet Take 1 tablet (5 mg total) by mouth 2 (two) times daily. 05/09/22   Marguerita Merles Latif, DO  atorvastatin (LIPITOR) 40 MG tablet Take 1 tablet (40 mg total) by mouth at bedtime. 08/16/22   [name], MD  AVODART 0.5 MG capsule Take 0.5 mg by mouth every other day. 07/20/11   [provider]  benzonatate (TESSALON) 100 MG capsule Take 200 mg by mouth 3 (three) times daily as needed. 10/15/22   [provider]  Cholecalciferol (VITAMIN D3 PO) Take 50 mcg by mouth daily.    [provider]  empagliflozin (JARDIANCE) 25 MG TABS tablet Take 1 tablet (25 mg total) by mouth daily before breakfast. 09/03/20   [place], Peter M, MD  furosemide (LASIX) 40 MG tablet TAKE 1 TABLET(40 MG) BY MOUTH DAILY Patient taking differently: Take 40 mg by mouth daily. 08/31/22   [place], Peter M, MD  gabapentin (NEURONTIN) 300 MG capsule Take 2  capsules (600 mg total) by mouth at bedtime. 08/16/22   [name], MD  insulin glargine (LANTUS) 100 UNIT/ML injection Inject 38 Units into the skin every morning.    [provider]  insulin glargine-yfgn (SEMGLEE) 100 UNIT/ML Pen Inject 40 Units into the skin daily. 08/26/22   [provider]  iron polysaccharides (NIFEREX) 150 MG capsule Take 1 capsule (150 mg total) by mouth daily. 05/06/22   Marguerita Merles Latif, DO  meloxicam (MOBIC) 7.5 MG tablet Take 7.5 mg by mouth daily as needed. 01/07/23   [provider]  metFORMIN (GLUCOPHAGE) 500 MG tablet Take 500 mg by mouth 2 (two) times daily with a meal.    [provider]  methocarbamol (ROBAXIN) 500 MG tablet Take 1,000 mg by mouth 3 (three) times daily as needed.  01/07/23   [provider]  metoprolol tartrate (LOPRESSOR) 50 MG tablet Take 1 tablet (50 mg total) by mouth 2 (two) times daily. 07/15/22   [place], Peter M, MD  pantoprazole (PROTONIX) 40 MG tablet TAKE 1 TABLET BY MOUTH DAILY AT NOON Patient taking differently: Take 40 mg by mouth daily at 12 noon. 06/03/22   Joylene Grapes, NP  tamsulosin (FLOMAX) 0.4 MG CAPS capsule Take 0.4 mg by mouth every other day. 09/28/22   [provider]  traMADol (ULTRAM) 50 MG tablet Take 1 tablet (50 mg total) by mouth 2 (two) times daily as needed. 12/07/22   [name], MD  TYLENOL 500 MG tablet Take 500-1,000 mg by mouth every 6 (six) hours as needed for mild pain or headache.    [provider]    Physical Exam: Vitals:   01/09/23 1720 01/09/23 1730 01/09/23 1900 01/09/23 1913  BP:  (!) 135/105  (!) 107/59  Pulse:  (!) 134 (!) 126 (!) 122  Resp:  (!) 21 (!) 28 20  Temp:      TempSrc:      SpO2:  96% 96% 91%  Weight: 84.4 kg     Height: 5\' 5"  (1.651 m)       Constitutional: NAD, calm  Eyes: PERTLA, lids and conjunctivae normal ENMT: Mucous membranes are moist. Posterior pharynx clear of any exudate or lesions.   Neck:  supple, no masses  Respiratory: no wheezing, no crackles. No accessory muscle use.  Cardiovascular: S1 & S2 heard, regular rate and rhythm. No extremity edema.   Abdomen: No distension, no tenderness, soft. Bowel sounds active.  Musculoskeletal: no clubbing / cyanosis. No joint deformity upper and lower extremities.   Skin: no significant rashes, lesions, ulcers. Warm, dry, well-perfused. Neurologic: CN 2-12 grossly intact. Moving all extremities. Alert and oriented.  Psychiatric: Pleasant. Cooperative.    Labs and Imaging on Admission: I have personally reviewed following labs and imaging studies  CBC: Recent Labs  Lab 01/09/23 1710 01/09/23 1717  WBC 22.4*  --   NEUTROABS 18.9*  --   HGB 10.4* 12.2*  HCT 33.6* 36.0*  MCV 74.7*  --   PLT 255  --    Basic Metabolic Panel: Recent Labs  Lab 01/09/23 1710 01/09/23 1717  NA 129* 133*  K 3.5 3.5  CL 94* 95*  CO2 23  --   GLUCOSE 218* 207*  BUN 24* 22  CREATININE 1.29* 1.40*  CALCIUM 8.9  --    GFR: Estimated Creatinine Clearance: 33.7 mL/min (A) (by C-G formula based on SCr of 1.4 mg/dL (H)). Liver Function Tests: Recent Labs  Lab 01/09/23 1710  AST 18  ALT 14  ALKPHOS 62  BILITOT 1.5*  PROT 7.1  ALBUMIN 3.4*   No results for input(s): "LIPASE", "AMYLASE" in the last 168 hours. No results for input(s): "AMMONIA" in the last 168 hours. Coagulation Profile: Recent Labs  Lab 01/09/23 1710  INR 1.5*   Cardiac Enzymes: No results for input(s): "CKTOTAL", "CKMB", "CKMBINDEX", "TROPONINI" in the last 168 hours. BNP (last 3 results) No results for input(s): "PROBNP" in the last 8760 hours. HbA1C: No results for input(s): "HGBA1C" in the last 72 hours. CBG: No results for input(s): "GLUCAP" in the last 168 hours. Lipid Profile: No results for input(s): "CHOL", "HDL", "LDLCALC", "TRIG", "CHOLHDL", "LDLDIRECT" in the last 72 hours. Thyroid Function Tests: No results for input(s): "TSH", "T4TOTAL", "FREET4",  "T3FREE", "THYROIDAB" in the last 72 hours. Anemia Panel: No  results for input(s): "VITAMINB12", "FOLATE", "FERRITIN", "TIBC", "IRON", "RETICCTPCT" in the last 72 hours. Urine analysis:    Component Value Date/Time   COLORURINE YELLOW 01/09/2023 1723   APPEARANCEUR CLEAR 01/09/2023 1723   LABSPEC 1.018 01/09/2023 1723   PHURINE 5.0 01/09/2023 1723   GLUCOSEU >=500 (A) 01/09/2023 1723   GLUCOSEU >=1000 (A) 06/22/2022 1152   HGBUR MODERATE (A) 01/09/2023 1723   BILIRUBINUR NEGATIVE 01/09/2023 1723   BILIRUBINUR negative 07/03/2020 0949   KETONESUR 5 (A) 01/09/2023 1723   PROTEINUR NEGATIVE 01/09/2023 1723   UROBILINOGEN 0.2 06/22/2022 1152   NITRITE NEGATIVE 01/09/2023 1723   LEUKOCYTESUR NEGATIVE 01/09/2023 1723   Sepsis Labs: @LABRCNTIP (procalcitonin:4,lacticidven:4) ) Recent Results (from the past 240 hour(s))  Resp panel by RT-PCR (RSV, Flu A&B, Covid) Anterior Nasal Swab     Status: None   Collection Time: 01/09/23  5:10 PM   Specimen: Anterior Nasal Swab  Result Value Ref Range Status   SARS Coronavirus 2 by RT PCR NEGATIVE NEGATIVE Final    Comment: (NOTE) SARS-CoV-2 target nucleic acids are NOT DETECTED.  The SARS-CoV-2 RNA is generally detectable in upper respiratory specimens during the acute phase of infection. The lowest concentration of SARS-CoV-2 viral copies this assay can detect is 138 copies/mL. A negative result does not preclude SARS-Cov-2 infection and should not be used as the sole basis for treatment or other patient management decisions. A negative result may occur with  improper specimen collection/handling, submission of specimen other than nasopharyngeal swab, presence of viral mutation(s) within the areas targeted by this assay, and inadequate number of viral copies(<138 copies/mL). A negative result must be combined with clinical observations, patient history, and epidemiological information. The expected result is Negative.  Fact Sheet for  Patients:  BloggerCourse.com  Fact Sheet for Healthcare Providers:  SeriousBroker.it  This test is no t yet approved or cleared by the [place] FDA and  has been authorized for detection and/or diagnosis of SARS-CoV-2 by FDA under an Emergency Use Authorization (EUA). This EUA will remain  in effect (meaning this test can be used) for the duration of the COVID-19 declaration under Section 564(b)(1) of the Act, 21 U.S.C.section 360bbb-3(b)(1), unless the authorization is terminated  or revoked sooner.       Influenza A by PCR NEGATIVE NEGATIVE Final   Influenza B by PCR NEGATIVE NEGATIVE Final    Comment: (NOTE) The Xpert Xpress SARS-CoV-2/FLU/RSV plus assay is intended as an aid in the diagnosis of influenza from Nasopharyngeal swab specimens and should not be used as a sole basis for treatment. Nasal washings and aspirates are unacceptable for Xpert Xpress SARS-CoV-2/FLU/RSV testing.  Fact Sheet for Patients: BloggerCourse.com  Fact Sheet for Healthcare Providers: SeriousBroker.it  This test is not yet approved or cleared by the [place] FDA and has been authorized for detection and/or diagnosis of SARS-CoV-2 by FDA under an Emergency Use Authorization (EUA). This EUA will remain in effect (meaning this test can be used) for the duration of the COVID-19 declaration under Section 564(b)(1) of the Act, 21 U.S.C. section 360bbb-3(b)(1), unless the authorization is terminated or revoked.     Resp Syncytial Virus by PCR NEGATIVE NEGATIVE Final    Comment: (NOTE) Fact Sheet for Patients: BloggerCourse.com  Fact Sheet for Healthcare Providers: SeriousBroker.it  This test is not yet approved or cleared by the [place] FDA and has been authorized for detection and/or diagnosis of SARS-CoV-2 by FDA under an Emergency  Use Authorization (EUA). This EUA will remain in effect (meaning  this test can be used) for the duration of the COVID-19 declaration under Section 564(b)(1) of the Act, 21 U.S.C. section 360bbb-3(b)(1), unless the authorization is terminated or revoked.  Performed at [facility], [address], [place], [place] [address]      Radiological Exams on Admission: DG Chest Port 1 View  Result Date: 01/09/2023 CLINICAL DATA:  Questionable sepsis, weakness and feeling sick, shortness of breath EXAM: PORTABLE CHEST 1 VIEW COMPARISON:  11/30/2022 FINDINGS: Hazy airspace and interstitial opacities in the left mid and lower lung compatible with pneumonia. No pneumothorax. Cardiomegaly and pulmonary vascular congestion. Sternotomy and CABG. Aortic atherosclerotic calcification. IMPRESSION: 1. Left mid and lower lung pneumonia. Follow-up in 6-8 weeks after treatment is recommended to ensure resolution. 2. Cardiomegaly and pulmonary vascular congestion. Electronically Signed   By: Minerva Fester M.D.   On: 01/09/2023 19:31   CT ABDOMEN PELVIS W CONTRAST  Result Date: 01/09/2023 CLINICAL DATA:  Right lower quadrant abdominal pain. Weakness and feeling sick. EXAM: CT ABDOMEN AND PELVIS WITH CONTRAST TECHNIQUE: Multidetector CT imaging of the abdomen and pelvis was performed using the standard protocol following bolus administration of intravenous contrast. RADIATION DOSE REDUCTION: This exam was performed according to the departmental dose-optimization program which includes automated exposure control, adjustment of the mA and/or kV according to patient size and/or use of iterative reconstruction technique. CONTRAST:  OMNIPAQUE IOHEXOL 300 MG/ML  SOLN COMPARISON:  CT abdomen and pelvis 04/21/2022 FINDINGS: Lower chest: Partially visualized consolidation in the left lower lobe. Small left pleural effusion. Right basilar atelectasis or infiltrates. Hepatobiliary: Cholelithiasis without  evidence of acute cholecystitis. Unremarkable liver and biliary tree. Pancreas: Unremarkable. Spleen: Unremarkable. Adrenals/Urinary Tract: Normal adrenal glands. 2 mm nonobstructing right nephrolithiasis. No obstructing ureteral calculi or hydronephrosis. Unremarkable bladder. Stomach/Bowel: Normal caliber large and small bowel. No bowel wall thickening. Appendix is normal. Small hiatal hernia. Vascular/Lymphatic: Advanced aortic atherosclerotic calcification. No lymphadenopathy. Reproductive: Prostatomegaly. Other: No free intraperitoneal fluid or air. Musculoskeletal: Right THA. Expansile lesion with cortical breakthrough in the posterior acetabulum measuring 6.2 x 4.3 cm. Large periprosthetic fluid collection is partially obscured by streak artifact from the right THA and extends below the inferior aspect of the exam. This measures approximately 8.8 x 5.6 cm in the axial plane. IMPRESSION: 1. Pulmonary findings compatible with multifocal pneumonia. 2. Large periprosthetic fluid collection is partially obscured by streak artifact from the right THA and extends below the inferior aspect of the exam. This measures approximately 8.8 x 5.6 cm in the axial plane. 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 If there is concern for infection, fluid sampling is recommended. 3. Unchanged expansile lesion with cortical breakthrough in the posterior acetabulum measuring 6.2 x 4.3 cm. Consider MRI for further evaluation 4. Cholelithiasis without evidence of acute cholecystitis. 5. 2 mm nonobstructing right nephrolithiasis. 6. Enlarged prostate. Aortic Atherosclerosis (ICD10-I70.0). Electronically Signed   By: Minerva Fester M.D.   On: 01/09/2023 19:29    EKG: Independently reviewed. Atrial fibrillation, rate 143.   Assessment/Plan   1. Sepsis d/t pneumonia; acute hypoxic respiratory failure   - Treat with Rocephin and azithromycin, check strep pneumo and legionella antigens, continue supplemental O2 as needed,  follow cultures and clinical course    2. Right hip periprosthetic fluid collection; acetabular lesion  - Noted on CT in ED  - No recent change in chronic hip pain; ROM intact  - Follow-up orthopedic surgery recommendations    3. Atrial fibrillation with RVR  - Rate improved to ~100 after  IVF and antipyretic in ED  - Hold Eliquis pending orthopedic surgery consultation, continue metoprolol   4. Hyponatremia  - Serum sodium is 129 in setting of hyperglycemia and hypovolemia   - He was given 1 liter LR in ED, will repeat chem panel in am   5. CAD  - No anginal symptoms  - Continue beta-blocker   6. Insulin-dependent DM  - A1c was 7.3% in August 2024  - Check CBGs, continue long-acting insulin, add low-intensity SSI for now    7. Chronic HFpEF  - Appears compensated  - Hold diuretic in setting of sepsis   8. CKD 3A  - Appears close to baseline  - Renally-dose medications    DVT prophylaxis: SCDs   Code Status: Full  Level of Care: Level of care: Stepdown Family Communication: None present   Disposition Plan:  Patient is from: Home  Anticipated d/c is to: TBD Anticipated d/c date is: 01/13/23  Patient currently: Pending treatment of sepsis/pneumonia, ortho consult  Consults called: Orthopedic surgery  Admission status: Inpatient     Briscoe Deutscher, MD [facility]  01/09/2023, 8:39 PM

## 2023-01-09 NOTE — ED Notes (Signed)
ED TO INPATIENT HANDOFF REPORT  ED Nurse Name and Phone #: Robbi Garter Name/Age/Gender [name] 89 y.o. male Room/Bed: WA16/WA16  Code Status   Code Status: Full Code  Home/SNF/Other Home Patient oriented to: self, place, time, and situation Is this baseline? Yes   Triage Complete: Triage complete  Chief Complaint Sepsis due to pneumonia (HCC) [J18.9, A41.9]  Triage Note Pt BIBA from home, c/o weakness and "feeling sick". Denies pains. En route EKG presents A fib rvr. NS started en route. Hx of A fib. Takes Eliquis. A/Ox4  BP 130/76 HR 170 RR 14 O2 93 RA ETCO2 29 CBG 248   Allergies Allergies  Allergen Reactions   Septra [Sulfamethoxazole-Trimethoprim] Itching   Cefadroxil Other (See Comments)    Unknown reaction    Ciprofloxacin Other (See Comments)    Unknown action     Erythromycin Other (See Comments)    Upsets the stomach     Level of Care/Admitting Diagnosis ED Disposition     ED Disposition  Admit   Condition  --   Comment  Hospital Area: [facility]  HOSPITAL [[id number]]  Level of Care: Stepdown [14]  Admit to SDU based on following criteria: Severe physiological/psychological symptoms:  Any diagnosis requiring assessment & intervention at least every 4 hours on an ongoing basis to obtain desired patient outcomes including stability and rehabilitation  May admit patient to [facility] or [facility] if equivalent level of care is available:: Yes  Covid Evaluation: Confirmed COVID Negative  Diagnosis: Sepsis due to pneumonia [facility]) [[id number]]  Admitting Physician: Briscoe Deutscher [[id number]]  Attending Physician: Briscoe Deutscher [[id number]]  Certification:: I certify this patient will need inpatient services for at least 2 midnights  Expected Medical Readiness: 01/12/2023          B Medical/Surgery History Past Medical History:  Diagnosis Date   Abdominal pain    Acute renal failure (HCC)     resolved   Arthritis    "hands &  legs" (11/'10/2014)   Ataxia    Atrial fibrillation (HCC)    Bladder outlet obstruction    Bladder outlet obstruction    BPH (benign prostatic hyperplasia)    CAD (coronary artery disease)    a. CABG IN 1989. b. 01/08/2015 CTO of ost LAD, LIMA to LAD not visualized but assumed patent given myoview finding, occluded SVG to diagonal, 99% mid RCA tx w/ SYNERGY DES 3X28 mm   Constipation    Diabetes mellitus type 2, insulin dependent (HCC)    Epistaxis, recurrent Feb 2017   GERD (gastroesophageal reflux disease)    HTN (hypertension)    Hx of bacterial pneumonia    Hyperlipemia    Kidney stones    Mild aortic stenosis    Mild cognitive impairment 03/05/2021   MMSE 26/30 and 6CIT 9 03/2021 AWV   Pneumonia 04/2019   Rib fractures    left rib fractures being treated with pain medications   Stented coronary artery Nov 2016   RCA DES   Urinary tract infection     Enterococcus   Past Surgical History:  Procedure Laterality Date   CARDIAC CATHETERIZATION  1989   CARDIAC CATHETERIZATION N/A 01/08/2015   Procedure: Left Heart Cath and Cors/Grafts Angiography;  Surgeon: Peter M Swaziland, MD;  Location: [facility] INVASIVE CV LAB;  Service: Cardiovascular;  Laterality: N/A;   CARDIAC CATHETERIZATION  01/08/2015   Procedure: Coronary Stent Intervention;  Surgeon: Peter M Swaziland, MD;  Location: [facility] INVASIVE CV LAB;  Service:  Cardiovascular;;   CARPAL TUNNEL RELEASE Right 08/2010   CATARACT EXTRACTION W/ INTRAOCULAR LENS  IMPLANT, BILATERAL Bilateral    COLONOSCOPY WITH PROPOFOL N/A 05/05/2022   Procedure: COLONOSCOPY WITH PROPOFOL;  Surgeon: Hilarie Fredrickson, MD;  Location: [facility] ENDOSCOPY;  Service: Gastroenterology;  Laterality: N/A;   CORONARY ANGIOPLASTY  01/08/15   RCA DES   CORONARY ARTERY BYPASS GRAFT  1989   "CABG X 2"   ESOPHAGOGASTRODUODENOSCOPY (EGD) WITH PROPOFOL N/A 05/05/2022   Procedure: ESOPHAGOGASTRODUODENOSCOPY (EGD) WITH PROPOFOL;  Surgeon: Hilarie Fredrickson, MD;  Location: [facility] ENDOSCOPY;  Service:  Gastroenterology;  Laterality: N/A;   IR THORACENTESIS ASP PLEURAL SPACE W/IMG GUIDE  05/14/2019   JOINT REPLACEMENT     POLYPECTOMY  05/05/2022   Procedure: POLYPECTOMY;  Surgeon: Hilarie Fredrickson, MD;  Location: [facility] ENDOSCOPY;  Service: Gastroenterology;;   TONSILLECTOMY     TOTAL HIP ARTHROPLASTY Right 2000     A IV Location/Drains/Wounds Patient Lines/Drains/Airways Status     Active Line/Drains/Airways     Name Placement date Placement time Site Days   Peripheral IV 01/09/23 18 G Left Antecubital 01/09/23  1728  Antecubital  less than 1   Peripheral IV 01/09/23 20 G Right Antecubital 01/09/23  1732  Antecubital  less than 1            Intake/Output Last 24 hours No intake or output data in the 24 hours ending 01/09/23 2048  Labs/Imaging Results for orders placed or performed during the hospital encounter of 01/09/23 (from the past 48 hour(s))  Resp panel by RT-PCR (RSV, Flu A&B, Covid) Anterior Nasal Swab     Status: None   Collection Time: 01/09/23  5:10 PM   Specimen: Anterior Nasal Swab  Result Value Ref Range   SARS Coronavirus 2 by RT PCR NEGATIVE NEGATIVE    Comment: (NOTE) SARS-CoV-2 target nucleic acids are NOT DETECTED.  The SARS-CoV-2 RNA is generally detectable in upper respiratory specimens during the acute phase of infection. The lowest concentration of SARS-CoV-2 viral copies this assay can detect is 138 copies/mL. A negative result does not preclude SARS-Cov-2 infection and should not be used as the sole basis for treatment or other patient management decisions. A negative result may occur with  improper specimen collection/handling, submission of specimen other than nasopharyngeal swab, presence of viral mutation(s) within the areas targeted by this assay, and inadequate number of viral copies(<138 copies/mL). A negative result must be combined with clinical observations, patient history, and epidemiological information. The expected result is  Negative.  Fact Sheet for Patients:  BloggerCourse.com  Fact Sheet for Healthcare Providers:  SeriousBroker.it  This test is no t yet approved or cleared by the [place] FDA and  has been authorized for detection and/or diagnosis of SARS-CoV-2 by FDA under an Emergency Use Authorization (EUA). This EUA will remain  in effect (meaning this test can be used) for the duration of the COVID-19 declaration under Section 564(b)(1) of the Act, 21 U.S.C.section 360bbb-3(b)(1), unless the authorization is terminated  or revoked sooner.       Influenza A by PCR NEGATIVE NEGATIVE   Influenza B by PCR NEGATIVE NEGATIVE    Comment: (NOTE) The Xpert Xpress SARS-CoV-2/FLU/RSV plus assay is intended as an aid in the diagnosis of influenza from Nasopharyngeal swab specimens and should not be used as a sole basis for treatment. Nasal washings and aspirates are unacceptable for Xpert Xpress SARS-CoV-2/FLU/RSV testing.  Fact Sheet for Patients: BloggerCourse.com  Fact Sheet for Healthcare Providers: SeriousBroker.it  This test is not yet approved or cleared by the [place] and has been authorized for detection and/or diagnosis of SARS-CoV-2 by FDA under an Emergency Use Authorization (EUA). This EUA will remain in effect (meaning this test can be used) for the duration of the COVID-19 declaration under Section 564(b)(1) of the Act, 21 U.S.C. section 360bbb-3(b)(1), unless the authorization is terminated or revoked.     Resp Syncytial Virus by PCR NEGATIVE NEGATIVE    Comment: (NOTE) Fact Sheet for Patients: BloggerCourse.com  Fact Sheet for Healthcare Providers: SeriousBroker.it  This test is not yet approved or cleared by the [place] FDA and has been authorized for detection and/or diagnosis of SARS-CoV-2 by FDA under an  Emergency Use Authorization (EUA). This EUA will remain in effect (meaning this test can be used) for the duration of the COVID-19 declaration under Section 564(b)(1) of the Act, 21 U.S.C. section 360bbb-3(b)(1), unless the authorization is terminated or revoked.  Performed at [facility], [address], [place], [place] [address]   Comprehensive metabolic panel     Status: Abnormal   Collection Time: 01/09/23  5:10 PM  Result Value Ref Range   Sodium 129 (L) 135 - 145 mmol/L   Potassium 3.5 3.5 - 5.1 mmol/L   Chloride 94 (L) 98 - 111 mmol/L   CO2 23 22 - 32 mmol/L   Glucose, Bld 218 (H) 70 - 99 mg/dL    Comment: Glucose reference range applies only to samples taken after fasting for at least 8 hours.   BUN 24 (H) 8 - 23 mg/dL   Creatinine, Ser 5.78 (H) 0.61 - 1.24 mg/dL   Calcium 8.9 8.9 - 46.9 mg/dL   Total Protein 7.1 6.5 - 8.1 g/dL   Albumin 3.4 (L) 3.5 - 5.0 g/dL   AST 18 15 - 41 U/L   ALT 14 0 - 44 U/L   Alkaline Phosphatase 62 38 - 126 U/L   Total Bilirubin 1.5 (H) <1.2 mg/dL   GFR, Estimated 52 (L) >60 mL/min    Comment: (NOTE) Calculated using the CKD-EPI Creatinine Equation (2021)    Anion gap 12 5 - 15    Comment: Performed at [facility], [address] [place], [place] [address]  CBC with Differential     Status: Abnormal   Collection Time: 01/09/23  5:10 PM  Result Value Ref Range   WBC 22.4 (H) 4.0 - 10.5 K/uL   RBC 4.50 4.22 - 5.81 MIL/uL   Hemoglobin 10.4 (L) 13.0 - 17.0 g/dL   HCT 84.1 (L) 32.4 - 40.1 %   MCV 74.7 (L) 80.0 - 100.0 fL   MCH 23.1 (L) 26.0 - 34.0 pg   MCHC 31.0 30.0 - 36.0 g/dL   RDW 02.7 (H) 25.3 - 66.4 %   Platelets 255 150 - 400 K/uL   nRBC 0.0 0.0 - 0.2 %   Neutrophils Relative % 84 %   Neutro Abs 18.9 (H) 1.7 - 7.7 K/uL   Lymphocytes Relative 6 %   Lymphs Abs 1.3 0.7 - 4.0 K/uL   Monocytes Relative 9 %   Monocytes Absolute 1.9 (H) 0.1 - 1.0 K/uL   Eosinophils Relative 0 %   Eosinophils  Absolute 0.0 0.0 - 0.5 K/uL   Basophils Relative 0 %   Basophils Absolute 0.1 0.0 - 0.1 K/uL   Immature Granulocytes 1 %   Abs Immature Granulocytes 0.24 (H) 0.00 - 0.07 K/uL    Comment: Performed at  [facility], [address] [place], [place] [address]  Protime-INR     Status: Abnormal   Collection Time: 01/09/23  5:10 PM  Result Value Ref Range   Prothrombin Time 18.7 (H) 11.4 - 15.2 seconds   INR 1.5 (H) 0.8 - 1.2    Comment: (NOTE) INR goal varies based on device and disease states. Performed at [facility], [address], [place], [place] [address]   APTT     Status: Abnormal   Collection Time: 01/09/23  5:10 PM  Result Value Ref Range   aPTT 39 (H) 24 - 36 seconds    Comment:        IF BASELINE aPTT IS ELEVATED, SUGGEST PATIENT RISK ASSESSMENT BE USED TO DETERMINE APPROPRIATE ANTICOAGULANT THERAPY. Performed at [facility], [address] [place], [place] [address]   I-stat chem 8, ED (not at [facility], [facility] or [facility])     Status: Abnormal   Collection Time: 01/09/23  5:17 PM  Result Value Ref Range   Sodium 133 (L) 135 - 145 mmol/L   Potassium 3.5 3.5 - 5.1 mmol/L   Chloride 95 (L) 98 - 111 mmol/L   BUN 22 8 - 23 mg/dL   Creatinine, Ser 3.24 (H) 0.61 - 1.24 mg/dL   Glucose, Bld 401 (H) 70 - 99 mg/dL    Comment: Glucose reference range applies only to samples taken after fasting for at least 8 hours.   Calcium, Ion 1.15 1.15 - 1.40 mmol/L   TCO2 23 22 - 32 mmol/L   Hemoglobin 12.2 (L) 13.0 - 17.0 g/dL   HCT 02.7 (L) 25.3 - 66.4 %  I-Stat CG4 Lactic Acid     Status: None   Collection Time: 01/09/23  5:17 PM  Result Value Ref Range   Lactic Acid, Venous 1.6 0.5 - 1.9 mmol/L  Urinalysis, w/ Reflex to Culture (Infection Suspected) -Urine, Clean Catch     Status: Abnormal   Collection Time: 01/09/23  5:23 PM  Result Value Ref Range   Specimen Source URINE, CLEAN CATCH    Color, Urine YELLOW YELLOW   APPearance  CLEAR CLEAR   Specific Gravity, Urine 1.018 1.005 - 1.030   pH 5.0 5.0 - 8.0   Glucose, UA >=500 (A) NEGATIVE mg/dL   Hgb urine dipstick MODERATE (A) NEGATIVE   Bilirubin Urine NEGATIVE NEGATIVE   Ketones, ur 5 (A) NEGATIVE mg/dL   Protein, ur NEGATIVE NEGATIVE mg/dL   Nitrite NEGATIVE NEGATIVE   Leukocytes,Ua NEGATIVE NEGATIVE   RBC / HPF 0-5 0 - 5 RBC/hpf   WBC, UA 0-5 0 - 5 WBC/hpf    Comment:        Reflex urine culture not performed if WBC <=10, OR if Squamous epithelial cells >5. If Squamous epithelial cells >5 suggest recollection.    Bacteria, UA NONE SEEN NONE SEEN   Squamous Epithelial / HPF 0-5 0 - 5 /HPF   Mucus PRESENT     Comment: Performed at [facility], [address] [place], [place] [address]   DG Chest Port 1 View  Result Date: 01/09/2023 CLINICAL DATA:  Questionable sepsis, weakness and feeling sick, shortness of breath EXAM: PORTABLE CHEST 1 VIEW COMPARISON:  11/30/2022 FINDINGS: Hazy airspace and interstitial opacities in the left mid and lower lung compatible with pneumonia. No pneumothorax. Cardiomegaly and pulmonary vascular congestion. Sternotomy and CABG. Aortic atherosclerotic calcification. IMPRESSION: 1. Left mid and lower lung pneumonia. Follow-up in 6-8 weeks after  treatment is recommended to ensure resolution. 2. Cardiomegaly and pulmonary vascular congestion. Electronically Signed   By: Minerva Fester M.D.   On: 01/09/2023 19:31   CT ABDOMEN PELVIS W CONTRAST  Result Date: 01/09/2023 CLINICAL DATA:  Right lower quadrant abdominal pain. Weakness and feeling sick. EXAM: CT ABDOMEN AND PELVIS WITH CONTRAST TECHNIQUE: Multidetector CT imaging of the abdomen and pelvis was performed using the standard protocol following bolus administration of intravenous contrast. RADIATION DOSE REDUCTION: This exam was performed according to the departmental dose-optimization program which includes automated exposure control, adjustment of the mA  and/or kV according to patient size and/or use of iterative reconstruction technique. CONTRAST:  OMNIPAQUE IOHEXOL 300 MG/ML  SOLN COMPARISON:  CT abdomen and pelvis 04/21/2022 FINDINGS: Lower chest: Partially visualized consolidation in the left lower lobe. Small left pleural effusion. Right basilar atelectasis or infiltrates. Hepatobiliary: Cholelithiasis without evidence of acute cholecystitis. Unremarkable liver and biliary tree. Pancreas: Unremarkable. Spleen: Unremarkable. Adrenals/Urinary Tract: Normal adrenal glands. 2 mm nonobstructing right nephrolithiasis. No obstructing ureteral calculi or hydronephrosis. Unremarkable bladder. Stomach/Bowel: Normal caliber large and small bowel. No bowel wall thickening. Appendix is normal. Small hiatal hernia. Vascular/Lymphatic: Advanced aortic atherosclerotic calcification. No lymphadenopathy. Reproductive: Prostatomegaly. Other: No free intraperitoneal fluid or air. Musculoskeletal: Right THA. Expansile lesion with cortical breakthrough in the posterior acetabulum measuring 6.2 x 4.3 cm. Large periprosthetic fluid collection is partially obscured by streak artifact from the right THA and extends below the inferior aspect of the exam. This measures approximately 8.8 x 5.6 cm in the axial plane. IMPRESSION: 1. Pulmonary findings compatible with multifocal pneumonia. 2. Large periprosthetic fluid collection is partially obscured by streak artifact from the right THA and extends below the inferior aspect of the exam. This measures approximately 8.8 x 5.6 cm in the axial plane. 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 If there is concern for infection, fluid sampling is recommended. 3. Unchanged expansile lesion with cortical breakthrough in the posterior acetabulum measuring 6.2 x 4.3 cm. Consider MRI for further evaluation 4. Cholelithiasis without evidence of acute cholecystitis. 5. 2 mm nonobstructing right nephrolithiasis. 6. Enlarged prostate. Aortic  Atherosclerosis (ICD10-I70.0). Electronically Signed   By: Minerva Fester M.D.   On: 01/09/2023 19:29    Pending Labs Unresulted Labs (From admission, onward)     Start     Ordered   01/10/23 0500  Basic metabolic panel  Daily,   R      01/09/23 2039   01/10/23 0500  Magnesium  Tomorrow morning,   R        01/09/23 2039   01/10/23 0500  CBC  Daily,   R      01/09/23 2039   01/09/23 2039  Legionella Pneumophila Serogp 1 Ur Ag  (COPD / Pneumonia / Cellulitis / Lower Extremity Wound)  Add-on,   AD        01/09/23 2039   01/09/23 2039  Strep pneumoniae urinary antigen  (COPD / Pneumonia / Cellulitis / Lower Extremity Wound)  Add-on,   AD        01/09/23 2039   01/09/23 1716  Blood Culture (routine x 2)  (Septic presentation on arrival (screening labs, nursing and treatment orders for obvious sepsis))  BLOOD CULTURE X 2,   STAT      01/09/23 1717            Vitals/Pain Today's Vitals   01/09/23 1720 01/09/23 1730 01/09/23 1900 01/09/23 1913  BP:  (!) 135/105  (!) 107/59  Pulse:  [name])  134 (!) 126 (!) 122  Resp:  (!) 21 (!) 28 20  Temp:      TempSrc:      SpO2:  96% 96% 91%  Weight: 84.4 kg     Height: 5\' 5"  (1.651 m)     PainSc: 0-No pain       Isolation Precautions No active isolations  Medications Medications  vancomycin (VANCOREADY) IVPB 1750 mg/350 mL (1,750 mg Intravenous New Bag/Given 01/09/23 1923)  metoprolol tartrate (LOPRESSOR) tablet 50 mg (has no administration in time range)  pantoprazole (PROTONIX) EC tablet 40 mg (has no administration in time range)  dutasteride (AVODART) capsule 0.5 mg (has no administration in time range)  tamsulosin (FLOMAX) capsule 0.4 mg (has no administration in time range)  insulin glargine-yfgn (SEMGLEE) injection 20 Units (has no administration in time range)  insulin aspart (novoLOG) injection 0-9 Units (has no administration in time range)  insulin aspart (novoLOG) injection 0-5 Units (has no administration in time range)   sodium chloride flush (NS) 0.9 % injection 3 mL (has no administration in time range)  acetaminophen (TYLENOL) tablet 650 mg (has no administration in time range)    Or  acetaminophen (TYLENOL) suppository 650 mg (has no administration in time range)  senna-docusate (Senokot-S) tablet 1 tablet (has no administration in time range)  ondansetron (ZOFRAN) tablet 4 mg (has no administration in time range)    Or  ondansetron (ZOFRAN) injection 4 mg (has no administration in time range)  cefTRIAXone (ROCEPHIN) 2 g in sodium chloride 0.9 % 100 mL IVPB (has no administration in time range)  azithromycin (ZITHROMAX) 500 mg in dextrose 5 % 250 mL IVPB (has no administration in time range)  lactated ringers bolus 1,000 mL (1,000 mLs Intravenous New Bag/Given 01/09/23 1733)  ceFEPIme (MAXIPIME) 2 g in sodium chloride 0.9 % 100 mL IVPB (0 g Intravenous Stopped 01/09/23 1753)  metroNIDAZOLE (FLAGYL) IVPB 500 mg (0 mg Intravenous Stopped 01/09/23 1923)  acetaminophen (TYLENOL) tablet 1,000 mg (1,000 mg Oral Given 01/09/23 1732)  iohexol (OMNIPAQUE) 300 MG/ML solution 100 mL (100 mLs Intravenous Contrast Given 01/09/23 1756)    Mobility walks with person assist     Focused Assessments     R Recommendations: See Admitting Provider Note  Report given to:   Additional Notes:

## 2023-01-09 NOTE — Progress Notes (Signed)
A consult was received from an ED physician for Vancomycin and Cefepime per pharmacy dosing.  The patient's profile has been reviewed for ht/wt/allergies/indication/available labs. Noted allergy to cefadroxil, but patient has received multiple cephalosporins in the past (including cefepime).   A one time order has been placed for Vancomycin 1750mg  IV and Cefepime 2g IV.  Further antibiotics/pharmacy consults should be ordered by admitting physician if indicated.                       Thank you, Jamse Mead 01/09/2023  5:25 PM

## 2023-01-09 NOTE — ED Triage Notes (Addendum)
Pt BIBA from home, c/o weakness and "feeling sick". Denies pains. En route EKG presents A fib rvr. NS started en route. Hx of A fib. Takes Eliquis. A/Ox4  BP 130/76 HR 170 RR 14 O2 93 RA ETCO2 29 CBG 248

## 2023-01-09 NOTE — ED Provider Notes (Signed)
[place] EMERGENCY DEPARTMENT AT [facility] Provider Note   CSN: [id number] Arrival date & time: 01/09/23  1655     History {Add pertinent medical, surgical, social history, OB history to HPI:1} No chief complaint on file.   [name] is a 89 y.o. male.  HPI     89 year old male with a history of coronary artery disease status post CABG in 1990, permanent atrial fibrillation on Eliquis, chronic diastolic congestive heart failure, aortic stenosis, hypertension, hyperlipidemia, carotid artery disease, type 2 diabetes, CVA presents with concern for generalized weakness over the past few days  Home Medications Prior to Admission medications   Medication Sig Start Date End Date Taking? Authorizing Provider  apixaban (ELIQUIS) 5 MG TABS tablet Take 1 tablet (5 mg total) by mouth 2 (two) times daily. 05/09/22   Marguerita Merles Latif, DO  atorvastatin (LIPITOR) 40 MG tablet Take 1 tablet (40 mg total) by mouth at bedtime. 08/16/22   [name], MD  AVODART 0.5 MG capsule Take 0.5 mg by mouth every other day. 07/20/11   [provider]  Cholecalciferol (VITAMIN D3 PO) Take 50 mcg by mouth daily.    [provider]  empagliflozin (JARDIANCE) 25 MG TABS tablet Take 1 tablet (25 mg total) by mouth daily before breakfast. 09/03/20   [place]azil[place], Peter M, MD  furosemide (LASIX) 40 MG tablet TAKE 1 TABLET(40 MG) BY MOUTH DAILY 08/31/22   [place], Peter M, MD  gabapentin (NEURONTIN) 300 MG capsule Take 2 capsules (600 mg total) by mouth at bedtime. 08/16/22   [name], MD  insulin glargine (LANTUS) 100 UNIT/ML injection Inject 38 Units into the skin every morning.    [provider]  iron polysaccharides (NIFEREX) 150 MG capsule Take 1 capsule (150 mg total) by mouth daily. 05/06/22   Marguerita Merles Latif, DO  metFORMIN (GLUCOPHAGE) 500 MG tablet Take 500 mg by mouth 2 (two) times daily with a meal.    [provider]  metoprolol tartrate  (LOPRESSOR) 50 MG tablet Take 1 tablet (50 mg total) by mouth 2 (two) times daily. 07/15/22   [place], Peter M, MD  pantoprazole (PROTONIX) 40 MG tablet TAKE 1 TABLET BY MOUTH DAILY AT NOON 06/03/22   [name] C, NP  tamsulosin (FLOMAX) 0.4 MG CAPS capsule Take 0.4 mg by mouth every other day. 09/28/22   [provider]  traMADol (ULTRAM) 50 MG tablet Take 1 tablet (50 mg total) by mouth 2 (two) times daily as needed. 12/07/22   [name], MD  TYLENOL 500 MG tablet Take 500-1,000 mg by mouth every 6 (six) hours as needed for mild pain or headache.    [provider]      Allergies    Septra [sulfamethoxazole-trimethoprim], Cefadroxil, Ciprofloxacin, and Erythromycin    Review of Systems   Review of Systems  Physical Exam Updated Vital Signs There were no vitals taken for this visit. Physical Exam  ED Results / Procedures / Treatments   Labs (all labs ordered are listed, but only abnormal results are displayed) Labs Reviewed - No data to display  EKG None  Radiology No results found.  Procedures Procedures  {Document cardiac monitor, telemetry assessment procedure when appropriate:1}  Medications Ordered in ED Medications - No data to display  ED Course/ Medical Decision Making/ A&P   {   Click here for ABCD2, HEART and other calculatorsREFRESH Note before signing :1}  Medical Decision Making Amount and/or Complexity of Data Reviewed Labs: ordered. Radiology: ordered. ECG/medicine tests: ordered.  Risk OTC drugs. Prescription drug management.   ***  EKG completed and personally evaluated interpreted by me shows atrial fibrillation with RVR.  Labs completed and personally about interpreted by me show creatinine of 1.29 from 1.23, sodium 129, leukocytosis of 22,000, stable mild anemia.  Lactic acid within normal limits.  Urinalysis without signs of infection  {Document critical care time when  appropriate:1} {Document review of labs and clinical decision tools ie heart score, Chads2Vasc2 etc:1}  {Document your independent review of radiology images, and any outside records:1} {Document your discussion with family members, caretakers, and with consultants:1} {Document social determinants of health affecting pt's care:1} {Document your decision making why or why not admission, treatments were needed:1} Final Clinical Impression(s) / ED Diagnoses Final diagnoses:  None    Rx / DC Orders ED Discharge Orders     None

## 2023-01-09 NOTE — ED Notes (Addendum)
Pt gone to CT 

## 2023-01-10 ENCOUNTER — Encounter (HOSPITAL_COMMUNITY): Payer: Self-pay | Admitting: Family Medicine

## 2023-01-10 DIAGNOSIS — R188 Other ascites: Secondary | ICD-10-CM | POA: Diagnosis not present

## 2023-01-10 DIAGNOSIS — J189 Pneumonia, unspecified organism: Secondary | ICD-10-CM | POA: Diagnosis not present

## 2023-01-10 DIAGNOSIS — Z7901 Long term (current) use of anticoagulants: Secondary | ICD-10-CM

## 2023-01-10 DIAGNOSIS — A419 Sepsis, unspecified organism: Secondary | ICD-10-CM

## 2023-01-10 DIAGNOSIS — Z951 Presence of aortocoronary bypass graft: Secondary | ICD-10-CM

## 2023-01-10 DIAGNOSIS — I4891 Unspecified atrial fibrillation: Secondary | ICD-10-CM | POA: Diagnosis not present

## 2023-01-10 DIAGNOSIS — I350 Nonrheumatic aortic (valve) stenosis: Secondary | ICD-10-CM

## 2023-01-10 LAB — BASIC METABOLIC PANEL
Anion gap: 13 (ref 5–15)
BUN: 24 mg/dL — ABNORMAL HIGH (ref 8–23)
CO2: 22 mmol/L (ref 22–32)
Calcium: 8.8 mg/dL — ABNORMAL LOW (ref 8.9–10.3)
Chloride: 96 mmol/L — ABNORMAL LOW (ref 98–111)
Creatinine, Ser: 1.28 mg/dL — ABNORMAL HIGH (ref 0.61–1.24)
GFR, Estimated: 53 mL/min — ABNORMAL LOW (ref >60)
Glucose, Bld: 180 mg/dL — ABNORMAL HIGH (ref 70–99)
Potassium: 3.3 mmol/L — ABNORMAL LOW (ref 3.5–5.1)
Sodium: 131 mmol/L — ABNORMAL LOW (ref 135–145)

## 2023-01-10 LAB — GLUCOSE, CAPILLARY
Glucose-Capillary: 150 mg/dL — ABNORMAL HIGH (ref 70–99)
Glucose-Capillary: 151 mg/dL — ABNORMAL HIGH (ref 70–99)
Glucose-Capillary: 187 mg/dL — ABNORMAL HIGH (ref 70–99)
Glucose-Capillary: 181 mg/dL — ABNORMAL HIGH (ref 70–99)

## 2023-01-10 LAB — HEPARIN LEVEL (UNFRACTIONATED): Heparin Unfractionated: >1.1 [IU]/mL — ABNORMAL HIGH (ref 0.30–0.70)

## 2023-01-10 LAB — APTT: aPTT: 38 s — ABNORMAL HIGH (ref 24–36)

## 2023-01-10 LAB — CBC
HCT: 35.4 % — ABNORMAL LOW (ref 39.0–52.0)
Hemoglobin: 10.3 g/dL — ABNORMAL LOW (ref 13.0–17.0)
MCH: 22.5 pg — ABNORMAL LOW (ref 26.0–34.0)
MCHC: 29.1 g/dL — ABNORMAL LOW (ref 30.0–36.0)
MCV: 77.3 fL — ABNORMAL LOW (ref 80.0–100.0)
Platelets: 227 10*3/uL (ref 150–400)
RBC: 4.58 MIL/uL (ref 4.22–5.81)
RDW: 18.1 % — ABNORMAL HIGH (ref 11.5–15.5)
WBC: 23.9 10*3/uL — ABNORMAL HIGH (ref 4.0–10.5)
nRBC: 0 % (ref 0.0–0.2)

## 2023-01-10 LAB — STREP PNEUMONIAE URINARY ANTIGEN: Strep Pneumo Urinary Antigen: NEGATIVE

## 2023-01-10 LAB — BRAIN NATRIURETIC PEPTIDE: B Natriuretic Peptide: 472.9 pg/mL — ABNORMAL HIGH (ref 0.0–100.0)

## 2023-01-10 LAB — PROCALCITONIN: Procalcitonin: 3.07 ng/mL

## 2023-01-10 LAB — MAGNESIUM: Magnesium: 2 mg/dL (ref 1.7–2.4)

## 2023-01-10 LAB — TSH: TSH: 1.429 u[IU]/mL (ref 0.350–4.500)

## 2023-01-10 MED ORDER — LEVALBUTEROL HCL 0.63 MG/3ML IN NEBU
INHALATION_SOLUTION | RESPIRATORY_TRACT | Status: AC
  Filled 2023-01-10: qty 3

## 2023-01-10 MED ORDER — POTASSIUM CHLORIDE 20 MEQ PO PACK: 20.0000 meq | PACK | Freq: Once | ORAL | Status: DC

## 2023-01-10 MED ORDER — GADOBUTROL 1 MMOL/ML IV SOLN
6.0000 mL | Freq: Once | INTRAVENOUS | Status: AC | PRN
  Administered 2023-01-10: 6 mL via INTRAVENOUS

## 2023-01-10 MED ORDER — POTASSIUM CHLORIDE CRYS ER 20 MEQ PO TBCR
20.0000 meq | EXTENDED_RELEASE_TABLET | Freq: Once | ORAL | Status: AC
  Administered 2023-01-10: 20 meq via ORAL
  Filled 2023-01-10: qty 1

## 2023-01-10 MED ORDER — HEPARIN (PORCINE) 25000 UT/250ML-% IV SOLN
1550.0000 [IU]/h | INTRAVENOUS | Status: AC
  Administered 2023-01-10: 1100 [IU]/h via INTRAVENOUS
  Filled 2023-01-10 (×2): qty 250

## 2023-01-10 MED ORDER — GUAIFENESIN-DM 100-10 MG/5ML PO SYRP
5.0000 mL | ORAL_SOLUTION | ORAL | Status: DC | PRN
  Administered 2023-01-10 – 2023-01-14 (×3): 5 mL via ORAL
  Filled 2023-01-10 (×3): qty 10

## 2023-01-10 MED ORDER — LEVALBUTEROL HCL 0.63 MG/3ML IN NEBU
0.6300 mg | INHALATION_SOLUTION | Freq: Four times a day (QID) | RESPIRATORY_TRACT | Status: DC | PRN
  Administered 2023-01-11: 0.63 mg via RESPIRATORY_TRACT
  Filled 2023-01-10: qty 3

## 2023-01-10 MED ORDER — TRAZODONE HCL 50 MG PO TABS
25.0000 mg | ORAL_TABLET | Freq: Once | ORAL | Status: AC
  Administered 2023-01-10: 25 mg via ORAL
  Filled 2023-01-10: qty 1

## 2023-01-10 NOTE — Plan of Care (Signed)
  Problem: Clinical Measurements: Goal: Ability to maintain a body temperature in the normal range will improve Outcome: Progressing   Problem: Respiratory: Goal: Ability to maintain adequate ventilation will improve Outcome: Progressing   Problem: Metabolic: Goal: Ability to maintain appropriate glucose levels will improve Outcome: Progressing   Problem: Skin Integrity: Goal: Risk for impaired skin integrity will decrease Outcome: Progressing   Problem: Activity: Goal: Ability to tolerate increased activity will improve Outcome: Not Progressing   Problem: Respiratory: Goal: Ability to maintain a clear airway will improve Outcome: Not Progressing   Problem: Coping: Goal: Ability to adjust to condition or change in health will improve Outcome: Not Progressing   Problem: Health Behavior/Discharge Planning: Goal: Ability to manage health-related needs will improve Outcome: Not Progressing

## 2023-01-10 NOTE — Progress Notes (Addendum)
[facility]                                                                              [name], is a 89 y.o. male, DOB - 04-30-1928, ZOX:[id number] Admit date - 01/09/2023    Outpatient Primary MD for the patient is [name], MD  LOS - 1  days  Chief Complaint  Patient presents with   Weakness       Brief summary   Patient is a 89 year old male with HTN, IDDM, CAD status post CABG in 1989 A-fib on Eliquis, chronic diastolic CHF, presented with fatigue, generalized malaise.  Patient ported that 3 to 4 days ago, he started having loss of appetite, trouble sleeping and fatigue.  Denied any significant shortness of breath or coughing.  No nausea vomiting, abdominal pain.  He has chronic right hip pain for which he receives injections periodically, not worse than usual. In ED, temp 103 F, HR 134, BP 135/105, dropped to 88/48, O2 sats 96% on 2 L CBC showed WBCs 22.4, was 9.3 on 11/30/2022 sodium 129, BUN 24, creatinine 1.2, trended up to 1.4 Chest x-ray showed left mid and lower lung pneumonia, cardiomegaly and pulmonary vascular congestion CT abdomen pelvis showed multifocal pneumonia, large periprostatic fluid collection 8.8x 5.6 cm concerning for particle disease, cortical breakthrough in the posterior stable measuring 6.2x 4.3 cm MRI right hip showed prior right total hip arthroplasty with large prostatic fluid collection up to 14.0 cm concerning for chronic disease, mild right iliopsoas bursitis, mild to moderate left hip osteoarthritis  Assessment & Plan    Principal Problem:   Sepsis due to pneumonia (HCC), POA, acute respiratory failure with hypoxia -Patient met sepsis criteria on admission with fevers, tachycardia, tachypnea, hypotension, hypoxia, source likely due to multifocal pneumonia -Leukocytosis with WBC 22.4.  RSV, flu, COVID-negative. -Follow blood cultures, urine strep, urine Legionella antigen -Currently placed on IV Zithromax,  Rocephin. -Wean O2 as tolerated  Active problems Right hip periprosthetic fluid collection; acetabular lesion  -Incidentally seen on the CT, has chronic left hip pain -Orthopedics consulted, no convincing signs or symptoms to suggest septic/infectious etiology.  Recommended observation and monitoring.  If patient fails to improve or develops worsening hip pain, may benefit from aspiration of the fluid collection for further analysis by IR. -MRI showed large periprostatic fluid collection measuring up to 14.0 cm concerning).  Mild right iliopsoas bursitis, mild to moderate left hip osteoarthritis  Atrial fibrillation with RVR  -Noted A-fib with RVR with rate up to 130s  -Cardiology consulted, currently no plans for DCCV as A-fib is permanent -Continue metoprolol 50 mg twice daily -Eliquis currently on hold   Hyponatremia  - Serum sodium is 129 in setting of hyperglycemia and hypovolemia   -Received fluid hydration in ED, sodium improved to 131    CAD status post CABG - No anginal symptoms  - Continue beta-blocker     Insulin-dependent DM  - A1c was 7.3% in August 2024  Continue sliding scale insulin while inpatient, Semglee 20 units daily CBG (last 3)  Recent Labs    01/09/23 2230 01/10/23 0755 01/10/23 1145  GLUCAP 150*  150* 151*     Chronic HFpEF, moderate to severe aortic stenosis - Appears compensated  -Outpatient on Lasix 40 mg daily, currently on hold due to sepsis and poor p.o. intake in the last few days -2D echo 05/02/2022 had shown EF of 65%, left ventricular diastolic parameters indeterminate, severely dilated left atrial size, moderate to severe aortic stenosis    CKD 3A  - Appears close to baseline  - Renally-dose medications   Microcytic anemia -Chronic, baseline 10-11, currently at baseline, no bleeding  Obesity Estimated body mass index is 30.95 kg/m as calculated from the following:   Height as of this encounter: 5\' 5"  (1.651 m).   Weight as of this  encounter: 84.4 kg.  Code Status: Full code DVT Prophylaxis:  SCDs Start: 01/09/23 2044   Level of Care: Level of care: Stepdown Family Communication: Updated patient Disposition Plan:      Remains inpatient appropriate:      Procedures:  None  Consultants:   Orthopedics Cardiology  Antimicrobials:   Anti-infectives (From admission, onward)    Start     Dose/Rate Route Frequency Ordered Stop   01/10/23 0400  cefTRIAXone (ROCEPHIN) 2 g in sodium chloride 0.9 % 100 mL IVPB        2 g 200 mL/hr over 30 Minutes Intravenous Every 24 hours 01/09/23 2039 01/15/23 0359   01/09/23 2200  azithromycin (ZITHROMAX) 500 mg in dextrose 5 % 250 mL IVPB        500 mg 250 mL/hr over 60 Minutes Intravenous Every 24 hours 01/09/23 2039 01/14/23 2159   01/09/23 1730  ceFEPIme (MAXIPIME) 2 g in sodium chloride 0.9 % 100 mL IVPB        2 g 200 mL/hr over 30 Minutes Intravenous  Once 01/09/23 1717 01/09/23 1753   01/09/23 1730  metroNIDAZOLE (FLAGYL) IVPB 500 mg        500 mg 100 mL/hr over 60 Minutes Intravenous  Once 01/09/23 1717 01/09/23 1923   01/09/23 1730  vancomycin (VANCOCIN) IVPB 1000 mg/200 mL premix  Status:  Discontinued        1,000 mg 200 mL/hr over 60 Minutes Intravenous  Once 01/09/23 1717 01/09/23 1725   01/09/23 1730  vancomycin (VANCOREADY) IVPB 1750 mg/350 mL        1,750 mg 175 mL/hr over 120 Minutes Intravenous  Once 01/09/23 1725 01/09/23 2123          Medications  Chlorhexidine Gluconate Cloth  6 each Topical Daily   [START ON 01/11/2023] dutasteride  0.5 mg Oral QODAY   insulin aspart  0-5 Units Subcutaneous QHS   insulin aspart  0-9 Units Subcutaneous TID WC   insulin glargine-yfgn  20 Units Subcutaneous Daily   metoprolol tartrate  50 mg Oral BID   pantoprazole  40 mg Oral Q1200   sodium chloride flush  3 mL Intravenous Q12H   [START ON 01/11/2023] tamsulosin  0.4 mg Oral QODAY      Subjective:   [name] was seen and examined today.  Sitting  up in the chair during my encounter, visibly upset and anxious as he was not able to reach his wife on the phone.  No acute chest pain, fever chills or shortness of breath.  Heart rate fluctuating between 100-1 30  Objective:   Vitals:   01/10/23 0800 01/10/23 0853 01/10/23 1100 01/10/23 1152  BP:  (!) 120/47 109/64   Pulse:  (!) 124 (!) 102   Resp:   18  Temp: 98.2 F (36.8 C)   98.1 F (36.7 C)  TempSrc: Oral   Oral  SpO2:   99%   Weight:      Height:        Intake/Output Summary (Last 24 hours) at 01/10/2023 1201 Last data filed at 01/10/2023 1053 Gross per 24 hour  Intake 6 ml  Output --  Net 6 ml     Wt Readings from Last 3 Encounters:  01/09/23 84.4 kg  12/14/22 84.4 kg  12/07/22 90.5 kg     Exam General: Alert and oriented x 3, NAD Cardiovascular: Irregular, tachycardia Respiratory: Diminished breath sounds throughout, no wheezing Gastrointestinal: Soft, nontender, nondistended, + bowel sounds Ext: no pedal edema bilaterally Neuro: no new deficit Psych: Normal affect     Data Reviewed:  I have personally reviewed following labs    CBC Lab Results  Component Value Date   WBC 23.9 (H) 01/10/2023   RBC 4.58 01/10/2023   HGB 10.3 (L) 01/10/2023   HCT 35.4 (L) 01/10/2023   MCV 77.3 (L) 01/10/2023   MCH 22.5 (L) 01/10/2023   PLT 227 01/10/2023   MCHC 29.1 (L) 01/10/2023   RDW 18.1 (H) 01/10/2023   LYMPHSABS 1.3 01/09/2023   MONOABS 1.9 (H) 01/09/2023   EOSABS 0.0 01/09/2023   BASOSABS 0.1 01/09/2023     Last metabolic panel Lab Results  Component Value Date   NA 131 (L) 01/10/2023   K 3.3 (L) 01/10/2023   CL 96 (L) 01/10/2023   CO2 22 01/10/2023   BUN 24 (H) 01/10/2023   CREATININE 1.28 (H) 01/10/2023   GLUCOSE 180 (H) 01/10/2023   GFRNONAA 53 (L) 01/10/2023   GFRAA 57 (L) 01/17/2020   CALCIUM 8.8 (L) 01/10/2023   PHOS 4.4 05/14/2022   PROT 7.1 01/09/2023   ALBUMIN 3.4 (L) 01/09/2023   BILITOT 1.5 (H) 01/09/2023   ALKPHOS 62  01/09/2023   AST 18 01/09/2023   ALT 14 01/09/2023   ANIONGAP 13 01/10/2023    CBG (last 3)  Recent Labs    01/09/23 2230 01/10/23 0755 01/10/23 1145  GLUCAP 150* 150* 151*      Coagulation Profile: Recent Labs  Lab 01/09/23 1710  INR 1.5*     Radiology Studies: I have personally reviewed the imaging studies  DG Chest Port 1 View  Result Date: 01/09/2023 CLINICAL DATA:  Questionable sepsis, weakness and feeling sick, shortness of breath EXAM: PORTABLE CHEST 1 VIEW COMPARISON:  11/30/2022 FINDINGS: Hazy airspace and interstitial opacities in the left mid and lower lung compatible with pneumonia. No pneumothorax. Cardiomegaly and pulmonary vascular congestion. Sternotomy and CABG. Aortic atherosclerotic calcification. IMPRESSION: 1. Left mid and lower lung pneumonia. Follow-up in 6-8 weeks after treatment is recommended to ensure resolution. 2. Cardiomegaly and pulmonary vascular congestion. Electronically Signed   By: Minerva Fester M.D.   On: 01/09/2023 19:31   CT ABDOMEN PELVIS W CONTRAST  Result Date: 01/09/2023 CLINICAL DATA:  Right lower quadrant abdominal pain. Weakness and feeling sick. EXAM: CT ABDOMEN AND PELVIS WITH CONTRAST TECHNIQUE: Multidetector CT imaging of the abdomen and pelvis was performed using the standard protocol following bolus administration of intravenous contrast. RADIATION DOSE REDUCTION: This exam was performed according to the departmental dose-optimization program which includes automated exposure control, adjustment of the mA and/or kV according to patient size and/or use of iterative reconstruction technique. CONTRAST:  OMNIPAQUE IOHEXOL 300 MG/ML  SOLN COMPARISON:  CT abdomen and pelvis 04/21/2022 FINDINGS: Lower chest: Partially visualized consolidation in the  left lower lobe. Small left pleural effusion. Right basilar atelectasis or infiltrates. Hepatobiliary: Cholelithiasis without evidence of acute cholecystitis. Unremarkable liver and  biliary tree. Pancreas: Unremarkable. Spleen: Unremarkable. Adrenals/Urinary Tract: Normal adrenal glands. 2 mm nonobstructing right nephrolithiasis. No obstructing ureteral calculi or hydronephrosis. Unremarkable bladder. Stomach/Bowel: Normal caliber large and small bowel. No bowel wall thickening. Appendix is normal. Small hiatal hernia. Vascular/Lymphatic: Advanced aortic atherosclerotic calcification. No lymphadenopathy. Reproductive: Prostatomegaly. Other: No free intraperitoneal fluid or air. Musculoskeletal: Right THA. Expansile lesion with cortical breakthrough in the posterior acetabulum measuring 6.2 x 4.3 cm. Large periprosthetic fluid collection is partially obscured by streak artifact from the right THA and extends below the inferior aspect of the exam. This measures approximately 8.8 x 5.6 cm in the axial plane. IMPRESSION: 1. Pulmonary findings compatible with multifocal pneumonia. 2. Large periprosthetic fluid collection is partially obscured by streak artifact from the right THA and extends below the inferior aspect of the exam. This measures approximately 8.8 x 5.6 cm in the axial plane. 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 If there is concern for infection, fluid sampling is recommended. 3. Unchanged expansile lesion with cortical breakthrough in the posterior acetabulum measuring 6.2 x 4.3 cm. Consider MRI for further evaluation 4. Cholelithiasis without evidence of acute cholecystitis. 5. 2 mm nonobstructing right nephrolithiasis. 6. Enlarged prostate. Aortic Atherosclerosis (ICD10-I70.0). Electronically Signed   By: Minerva Fester M.D.   On: 01/09/2023 19:29       Amando Chaput M.D. [facility] 01/10/2023, 12:01 PM  Available via Epic secure chat 7am-7pm After 7 pm, please refer to night coverage provider listed on amion.

## 2023-01-10 NOTE — Consult Note (Signed)
Reason for Consult: Right hip periprosthetic fluid collection Referring Physician: [facility] hospitalist  [name] is an 89 y.o. male.  HPI: [name] is admitted to the hospitalist service and undergoing treatment for sepsis secondary to pneumonia.  On imaging and workup there was incidental finding of right hip periprosthetic fluid collection.  This was demonstrated on CT and an MRI scan has been obtained as well but not read yet.  Orthopedics was consulted for further evaluation.  Patient states he has a history of a right total hip replacement remotely. He says this was performed "50 years ago".  He states he has had some injections in the past laterally about the hip which have been very helpful but he denies any worsening pain outside of his baseline.  He is able to walk without significant hip pain and has intact range of motion.  He has not noticed any swelling or erythema or wounds about the right hip.  Past Medical History:  Diagnosis Date   Abdominal pain    Acute renal failure (HCC)     resolved   Aortic stenosis    Arthritis    "hands & legs" (11/'10/2014)   Ataxia    Bladder outlet obstruction    BPH (benign prostatic hyperplasia)    CAD (coronary artery disease)    a. CABG IN 1989. b. 01/08/2015 CTO of ost LAD, LIMA to LAD not visualized but assumed patent given myoview finding, occluded SVG to diagonal, 99% mid RCA tx w/ SYNERGY DES 3X28 mm   Chronic heart failure with preserved ejection fraction (HFpEF) (HCC)    Constipation    Diabetes mellitus type 2, insulin dependent (HCC)    Epistaxis, recurrent 04/2015   GERD (gastroesophageal reflux disease)    HTN (hypertension)    Hx of bacterial pneumonia    Hyperlipemia    Kidney stones    Mild cognitive impairment 03/05/2021   MMSE 26/30 and 6CIT 9 03/2021 AWV   Permanent atrial fibrillation (HCC)    Pneumonia 04/2019   Rib fractures    left rib fractures being treated with pain medications   Thoracic aortic aneurysm (HCC)     Urinary tract infection     Enterococcus    Past Surgical History:  Procedure Laterality Date   CARDIAC CATHETERIZATION  1989   CARDIAC CATHETERIZATION N/A 01/08/2015   Procedure: Left Heart Cath and Cors/Grafts Angiography;  Surgeon: Peter M Swaziland, MD;  Location: [facility] INVASIVE CV LAB;  Service: Cardiovascular;  Laterality: N/A;   CARDIAC CATHETERIZATION  01/08/2015   Procedure: Coronary Stent Intervention;  Surgeon: Peter M Swaziland, MD;  Location: [facility] INVASIVE CV LAB;  Service: Cardiovascular;;   CARPAL TUNNEL RELEASE Right 08/2010   CATARACT EXTRACTION W/ INTRAOCULAR LENS  IMPLANT, BILATERAL Bilateral    COLONOSCOPY WITH PROPOFOL N/A 05/05/2022   Procedure: COLONOSCOPY WITH PROPOFOL;  Surgeon: Hilarie Fredrickson, MD;  Location: [facility] ENDOSCOPY;  Service: Gastroenterology;  Laterality: N/A;   CORONARY ANGIOPLASTY  01/08/15   RCA DES   CORONARY ARTERY BYPASS GRAFT  1989   "CABG X 2"   ESOPHAGOGASTRODUODENOSCOPY (EGD) WITH PROPOFOL N/A 05/05/2022   Procedure: ESOPHAGOGASTRODUODENOSCOPY (EGD) WITH PROPOFOL;  Surgeon: Hilarie Fredrickson, MD;  Location: [facility] ENDOSCOPY;  Service: Gastroenterology;  Laterality: N/A;   IR THORACENTESIS ASP PLEURAL SPACE W/IMG GUIDE  05/14/2019   JOINT REPLACEMENT     POLYPECTOMY  05/05/2022   Procedure: POLYPECTOMY;  Surgeon: Hilarie Fredrickson, MD;  Location: [facility] ENDOS[facility];  Service: Gastroenterology;;   TONSILLECTOMY  TOTAL HIP ARTHROPLASTY Right 2000    Family History  Problem Relation Age of Onset   Brain cancer Father    Throat cancer Brother    Liver disease Neg Hx    Colon cancer Neg Hx    Esophageal cancer Neg Hx     Social History:  reports that he quit smoking about 64 years ago. His smoking use included cigarettes. He started smoking about 74 years ago. He has a 30 pack-year smoking history. He has never used smokeless tobacco. He reports that he does not drink alcohol and does not use drugs.  Allergies:  Allergies  Allergen Reactions   Septra  [Sulfamethoxazole-Trimethoprim] Itching   Cefadroxil Other (See Comments)    Unknown reaction    Ciprofloxacin Other (See Comments)    Unknown action     Erythromycin Other (See Comments)    Upsets the stomach     Medications: I have reviewed the patient's current medications.  Results for orders placed or performed during the hospital encounter of 01/09/23 (from the past 48 hour(s))  Resp panel by RT-PCR (RSV, Flu A&B, Covid) Anterior Nasal Swab     Status: None   Collection Time: 01/09/23  5:10 PM   Specimen: Anterior Nasal Swab  Result Value Ref Range   SARS Coronavirus 2 by RT PCR NEGATIVE NEGATIVE    Comment: (NOTE) SARS-CoV-2 target nucleic acids are NOT DETECTED.  The SARS-CoV-2 RNA is generally detectable in upper respiratory specimens during the acute phase of infection. The lowest concentration of SARS-CoV-2 viral copies this assay can detect is 138 copies/mL. A negative result does not preclude SARS-Cov-2 infection and should not be used as the sole basis for treatment or other patient management decisions. A negative result may occur with  improper specimen collection/handling, submission of specimen other than nasopharyngeal swab, presence of viral mutation(s) within the areas targeted by this assay, and inadequate number of viral copies(<138 copies/mL). A negative result must be combined with clinical observations, patient history, and epidemiological information. The expected result is Negative.  Fact Sheet for Patients:  BloggerCourse.com  Fact Sheet for Healthcare Providers:  SeriousBroker.it  This test is no t yet approved or cleared by the [place] FDA and  has been authorized for detection and/or diagnosis of SARS-CoV-2 by FDA under an Emergency Use Authorization (EUA). This EUA will remain  in effect (meaning this test can be used) for the duration of the COVID-19 declaration under Section  564(b)(1) of the Act, 21 U.S.C.section 360bbb-3(b)(1), unless the authorization is terminated  or revoked sooner.       Influenza A by PCR NEGATIVE NEGATIVE   Influenza B by PCR NEGATIVE NEGATIVE    Comment: (NOTE) The Xpert Xpress SARS-CoV-2/FLU/RSV plus assay is intended as an aid in the diagnosis of influenza from Nasopharyngeal swab specimens and should not be used as a sole basis for treatment. Nasal washings and aspirates are unacceptable for Xpert Xpress SARS-CoV-2/FLU/RSV testing.  Fact Sheet for Patients: BloggerCourse.com  Fact Sheet for Healthcare Providers: SeriousBroker.it  This test is not yet approved or cleared by the [place] FDA and has been authorized for detection and/or diagnosis of SARS-CoV-2 by FDA under an Emergency Use Authorization (EUA). This EUA will remain in effect (meaning this test can be used) for the duration of the COVID-19 declaration under Section 564(b)(1) of the Act, 21 U.S.C. section 360bbb-3(b)(1), unless the authorization is terminated or revoked.     Resp Syncytial Virus by PCR NEGATIVE NEGATIVE  Comment: (NOTE) Fact Sheet for Patients: BloggerCourse.com  Fact Sheet for Healthcare Providers: SeriousBroker.it  This test is not yet approved or cleared by the [place] FDA and has been authorized for detection and/or diagnosis of SARS-CoV-2 by FDA under an Emergency Use Authorization (EUA). This EUA will remain in effect (meaning this test can be used) for the duration of the COVID-19 declaration under Section 564(b)(1) of the Act, 21 U.S.C. section 360bbb-3(b)(1), unless the authorization is terminated or revoked.  Performed at [facility], [address] [place], [place] [address]   Comprehensive metabolic panel     Status: Abnormal   Collection Time: 01/09/23  5:10 PM  Result Value Ref Range    Sodium 129 (L) 135 - 145 mmol/L   Potassium 3.5 3.5 - 5.1 mmol/L   Chloride 94 (L) 98 - 111 mmol/L   CO2 23 22 - 32 mmol/L   Glucose, Bld 218 (H) 70 - 99 mg/dL    Comment: Glucose reference range applies only to samples taken after fasting for at least 8 hours.   BUN 24 (H) 8 - 23 mg/dL   Creatinine, Ser 6.04 (H) 0.61 - 1.24 mg/dL   Calcium 8.9 8.9 - 54.0 mg/dL   Total Protein 7.1 6.5 - 8.1 g/dL   Albumin 3.4 (L) 3.5 - 5.0 g/dL   AST 18 15 - 41 U/L   ALT 14 0 - 44 U/L   Alkaline Phosphatase 62 38 - 126 U/L   Total Bilirubin 1.5 (H) <1.2 mg/dL   GFR, Estimated 52 (L) >60 mL/min    Comment: (NOTE) Calculated using the CKD-EPI Creatinine Equation (2021)    Anion gap 12 5 - 15    Comment: Performed at [facility], [address] [place], [place] [address]  CBC with Differential     Status: Abnormal   Collection Time: 01/09/23  5:10 PM  Result Value Ref Range   WBC 22.4 (H) 4.0 - 10.5 K/uL   RBC 4.50 4.22 - 5.81 MIL/uL   Hemoglobin 10.4 (L) 13.0 - 17.0 g/dL   HCT 14.7 (L) 82.9 - 56.2 %   MCV 74.7 (L) 80.0 - 100.0 fL   MCH 23.1 (L) 26.0 - 34.0 pg   MCHC 31.0 30.0 - 36.0 g/dL   RDW 13.0 (H) 86.5 - 78.4 %   Platelets 255 150 - 400 K/uL   nRBC 0.0 0.0 - 0.2 %   Neutrophils Relative % 84 %   Neutro Abs 18.9 (H) 1.7 - 7.7 K/uL   Lymphocytes Relative 6 %   Lymphs Abs 1.3 0.7 - 4.0 K/uL   Monocytes Relative 9 %   Monocytes Absolute 1.9 (H) 0.1 - 1.0 K/uL   Eosinophils Relative 0 %   Eosinophils Absolute 0.0 0.0 - 0.5 K/uL   Basophils Relative 0 %   Basophils Absolute 0.1 0.0 - 0.1 K/uL   Immature Granulocytes 1 %   Abs Immature Granulocytes 0.24 (H) 0.00 - 0.07 K/uL    Comment: Performed at [facility], [address], [place], [place] [address]  Protime-INR     Status: Abnormal   Collection Time: 01/09/23  5:10 PM  Result Value Ref Range   Prothrombin Time 18.7 (H) 11.4 - 15.2 seconds   INR 1.5 (H) 0.8 - 1.2    Comment: (NOTE) INR goal  varies based on device and disease states. Performed at [facility], [address], [place], [place] [address]   APTT  Status: Abnormal   Collection Time: 01/09/23  5:10 PM  Result Value Ref Range   aPTT 39 (H) 24 - 36 seconds    Comment:        IF BASELINE aPTT IS ELEVATED, SUGGEST PATIENT RISK ASSESSMENT BE USED TO DETERMINE APPROPRIATE ANTICOAGULANT THERAPY. Performed at [facility], [address], [place], [place] [address]   Blood Culture (routine x 2)     Status: None (Preliminary result)   Collection Time: 01/09/23  5:10 PM   Specimen: BLOOD  Result Value Ref Range   Specimen Description      BLOOD BLOOD LEFT ARM Performed at [facility], [address], [place], [place] [address]    Special Requests      BOTTLES DRAWN AEROBIC AND ANAEROBIC Blood Culture adequate volume Performed at [facility], [address] [place], [place] [address]    Culture      NO GROWTH < 24 HOURS Performed at [facility], [address], [place], [place] [address]    Report Status PENDING   I-stat chem 8, ED (not at [facility], [facility] or [facility])     Status: Abnormal   Collection Time: 01/09/23  5:17 PM  Result Value Ref Range   Sodium 133 (L) 135 - 145 mmol/L   Potassium 3.5 3.5 - 5.1 mmol/L   Chloride 95 (L) 98 - 111 mmol/L   BUN 22 8 - 23 mg/dL   Creatinine, Ser 8.41 (H) 0.61 - 1.24 mg/dL   Glucose, Bld 324 (H) 70 - 99 mg/dL    Comment: Glucose reference range applies only to samples taken after fasting for at least 8 hours.   Calcium, Ion 1.15 1.15 - 1.40 mmol/L   TCO2 23 22 - 32 mmol/L   Hemoglobin 12.2 (L) 13.0 - 17.0 g/dL   HCT 40.1 (L) 02.7 - 25.3 %  I-Stat CG4 Lactic Acid     Status: None   Collection Time: 01/09/23  5:17 PM  Result Value Ref Range   Lactic Acid, Venous 1.6 0.5 - 1.9 mmol/L  Blood Culture (routine x 2)     Status: None (Preliminary result)   Collection Time: 01/09/23  5:20 PM    Specimen: BLOOD  Result Value Ref Range   Specimen Description      BLOOD BLOOD RIGHT ARM Performed at [facility], [address], [place], [place] [address]    Special Requests      BOTTLES DRAWN AEROBIC AND ANAEROBIC Blood Culture results may not be optimal due to an inadequate volume of blood received in culture bottles Performed at [facility], [address], [place], [place] [address]    Culture      NO GROWTH < 24 HOURS Performed at [facility], [address], [place], [place] [address]    Report Status PENDING   Urinalysis, w/ Reflex to Culture (Infection Suspected) -Urine, Clean Catch     Status: Abnormal   Collection Time: 01/09/23  5:23 PM  Result Value Ref Range   Specimen Source URINE, CLEAN CATCH    Color, Urine YELLOW YELLOW   APPearance CLEAR CLEAR   Specific Gravity, Urine 1.018 1.005 - 1.030   pH 5.0 5.0 - 8.0   Glucose, UA >=500 (A) NEGATIVE mg/dL   Hgb urine dipstick MODERATE (A) NEGATIVE   Bilirubin Urine NEGATIVE NEGATIVE   Ketones, ur 5 (A) NEGATIVE mg/dL   Protein, ur NEGATIVE NEGATIVE mg/dL  Nitrite NEGATIVE NEGATIVE   Leukocytes,Ua NEGATIVE NEGATIVE   RBC / HPF 0-5 0 - 5 RBC/hpf   WBC, UA 0-5 0 - 5 WBC/hpf    Comment:        Reflex urine culture not performed if WBC <=10, OR if Squamous epithelial cells >5. If Squamous epithelial cells >5 suggest recollection.    Bacteria, UA NONE SEEN NONE SEEN   Squamous Epithelial / HPF 0-5 0 - 5 /HPF   Mucus PRESENT     Comment: Performed at [facility], [address], [place], [place] [address]  MRSA Next Gen by PCR, Nasal     Status: None   Collection Time: 01/09/23 10:08 PM   Specimen: Nasal Mucosa; Nasal Swab  Result Value Ref Range   MRSA by PCR Next Gen NOT DETECTED NOT DETECTED    Comment: (NOTE) The GeneXpert MRSA Assay (FDA approved for NASAL specimens only), is one component of a comprehensive MRSA colonization  surveillance program. It is not intended to diagnose MRSA infection nor to guide or monitor treatment for MRSA infections. Test performance is not FDA approved in patients less than 37 years old. Performed at [facility], [address], [place], [place] [address]   Glucose, capillary     Status: Abnormal   Collection Time: 01/09/23 10:30 PM  Result Value Ref Range   Glucose-Capillary 150 (H) 70 - 99 mg/dL    Comment: Glucose reference range applies only to samples taken after fasting for at least 8 hours.   Comment 1 Notify RN    Comment 2 Document in Chart   Basic metabolic panel     Status: Abnormal   Collection Time: 01/10/23  2:56 AM  Result Value Ref Range   Sodium 131 (L) 135 - 145 mmol/L   Potassium 3.3 (L) 3.5 - 5.1 mmol/L   Chloride 96 (L) 98 - 111 mmol/L   CO2 22 22 - 32 mmol/L   Glucose, Bld 180 (H) 70 - 99 mg/dL    Comment: Glucose reference range applies only to samples taken after fasting for at least 8 hours.   BUN 24 (H) 8 - 23 mg/dL   Creatinine, Ser 0.98 (H) 0.61 - 1.24 mg/dL   Calcium 8.8 (L) 8.9 - 10.3 mg/dL   GFR, Estimated 53 (L) >60 mL/min    Comment: (NOTE) Calculated using the CKD-EPI Creatinine Equation (2021)    Anion gap 13 5 - 15    Comment: Performed at [facility], [address], [place], [place] [address]  Magnesium     Status: None   Collection Time: 01/10/23  2:56 AM  Result Value Ref Range   Magnesium 2.0 1.7 - 2.4 mg/dL    Comment: Performed at [facility], [address] [place], [place] [address]  CBC     Status: Abnormal   Collection Time: 01/10/23  2:56 AM  Result Value Ref Range   WBC 23.9 (H) 4.0 - 10.5 K/uL   RBC 4.58 4.22 - 5.81 MIL/uL   Hemoglobin 10.3 (L) 13.0 - 17.0 g/dL   HCT 62.1 (L) 30.8 - 65.7 %   MCV 77.3 (L) 80.0 - 100.0 fL   MCH 22.5 (L) 26.0 - 34.0 pg   MCHC 29.1 (L) 30.0 - 36.0 g/dL   RDW 84.6 (H) 96.2 - 95.2 %   Platelets 227 150 - 400 K/uL   nRBC  0.0 0.0 - 0.2 %    Comment: Performed at  [facility], [address], [place], [place] [address]  Procalcitonin     Status: None   Collection Time: 01/10/23  6:24 AM  Result Value Ref Range   Procalcitonin 3.07 ng/mL    Comment:        Interpretation: PCT > 2 ng/mL: Systemic infection (sepsis) is likely, unless other causes are known. (NOTE)       Sepsis PCT Algorithm           Lower Respiratory Tract                                      Infection PCT Algorithm    ----------------------------     ----------------------------         PCT < 0.25 ng/mL                PCT < 0.10 ng/mL          Strongly encourage             Strongly discourage   discontinuation of antibiotics    initiation of antibiotics    ----------------------------     -----------------------------       PCT 0.25 - 0.50 ng/mL            PCT 0.10 - 0.25 ng/mL               OR       >80% decrease in PCT            Discourage initiation of                                            antibiotics      Encourage discontinuation           of antibiotics    ----------------------------     -----------------------------         PCT >= 0.50 ng/mL              PCT 0.26 - 0.50 ng/mL               AND       <80% decrease in PCT              Encourage initiation of                                             antibiotics       Encourage continuation           of antibiotics    ----------------------------     -----------------------------        PCT >= 0.50 ng/mL                  PCT > 0.50 ng/mL               AND         increase in PCT                  Strongly encourage  initiation of antibiotics    Strongly encourage escalation           of antibiotics                                     -----------------------------                                           PCT <= 0.25 ng/mL                                                 OR                                         > 80% decrease in PCT                                      Discontinue / Do not initiate                                             antibiotics  Performed at [facility], [address] [place], [place] [address]   Glucose, capillary     Status: Abnormal   Collection Time: 01/10/23  7:55 AM  Result Value Ref Range   Glucose-Capillary 150 (H) 70 - 99 mg/dL    Comment: Glucose reference range applies only to samples taken after fasting for at least 8 hours.   Comment 1 Notify RN    Comment 2 Document in Chart     DG Chest Port 1 View  Result Date: 01/09/2023 CLINICAL DATA:  Questionable sepsis, weakness and feeling sick, shortness of breath EXAM: PORTABLE CHEST 1 VIEW COMPARISON:  11/30/2022 FINDINGS: Hazy airspace and interstitial opacities in the left mid and lower lung compatible with pneumonia. No pneumothorax. Cardiomegaly and pulmonary vascular congestion. Sternotomy and CABG. Aortic atherosclerotic calcification. IMPRESSION: 1. Left mid and lower lung pneumonia. Follow-up in 6-8 weeks after treatment is recommended to ensure resolution. 2. Cardiomegaly and pulmonary vascular congestion. Electronically Signed   By: Minerva Fester M.D.   On: 01/09/2023 19:31   CT ABDOMEN PELVIS W CONTRAST  Result Date: 01/09/2023 CLINICAL DATA:  Right lower quadrant abdominal pain. Weakness and feeling sick. EXAM: CT ABDOMEN AND PELVIS WITH CONTRAST TECHNIQUE: Multidetector CT imaging of the abdomen and pelvis was performed using the standard protocol following bolus administration of intravenous contrast. RADIATION DOSE REDUCTION: This exam was performed according to the departmental dose-optimization program which includes automated exposure control, adjustment of the mA and/or kV according to patient size and/or use of iterative reconstruction technique. CONTRAST:  OMNIPAQUE IOHEXOL 300 MG/ML  SOLN COMPARISON:  CT abdomen and pelvis 04/21/2022 FINDINGS: Lower chest:  Partially visualized consolidation in the left lower lobe. Small left pleural effusion. Right basilar atelectasis or infiltrates. Hepatobiliary: Cholelithiasis without evidence of acute cholecystitis. Unremarkable liver and biliary tree. Pancreas:  Unremarkable. Spleen: Unremarkable. Adrenals/Urinary Tract: Normal adrenal glands. 2 mm nonobstructing right nephrolithiasis. No obstructing ureteral calculi or hydronephrosis. Unremarkable bladder. Stomach/Bowel: Normal caliber large and small bowel. No bowel wall thickening. Appendix is normal. Small hiatal hernia. Vascular/Lymphatic: Advanced aortic atherosclerotic calcification. No lymphadenopathy. Reproductive: Prostatomegaly. Other: No free intraperitoneal fluid or air. Musculoskeletal: Right THA. Expansile lesion with cortical breakthrough in the posterior acetabulum measuring 6.2 x 4.3 cm. Large periprosthetic fluid collection is partially obscured by streak artifact from the right THA and extends below the inferior aspect of the exam. This measures approximately 8.8 x 5.6 cm in the axial plane. IMPRESSION: 1. Pulmonary findings compatible with multifocal pneumonia. 2. Large periprosthetic fluid collection is partially obscured by streak artifact from the right THA and extends below the inferior aspect of the exam. This measures approximately 8.8 x 5.6 cm in the axial plane. 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 If there is concern for infection, fluid sampling is recommended. 3. Unchanged expansile lesion with cortical breakthrough in the posterior acetabulum measuring 6.2 x 4.3 cm. Consider MRI for further evaluation 4. Cholelithiasis without evidence of acute cholecystitis. 5. 2 mm nonobstructing right nephrolithiasis. 6. Enlarged prostate. Aortic Atherosclerosis (ICD10-I70.0). Electronically Signed   By: Minerva Fester M.D.   On: 01/09/2023 19:29    Review of Systems Blood pressure (!) 120/47, pulse (!) 124, temperature 98.2 F (36.8 C),  temperature source Oral, resp. rate (!) 34, height 5\' 5"  (1.651 m), weight 84.4 kg, SpO2 97%. Physical Exam Constitutional:      General: He is not in acute distress. HENT:     Head: Normocephalic and atraumatic.     Nose: Nose normal.     Mouth/Throat:     Mouth: Mucous membranes are moist.  Eyes:     General: No scleral icterus.    Extraocular Movements: Extraocular movements intact.  Pulmonary:     Effort: No respiratory distress.  Musculoskeletal:     Comments: Examination of the right hip shows mild tenderness over the lateral aspect of the hip.  There is no overlying warmth, erythema, or palpable fluctuance.  He has some stiffness passive internal and external rotation of the hip but no pain.  He has intact range of motion and strength otherwise throughout.  Warm and well-perfused distally with intact sensation.  Neurological:     Mental Status: He is alert.     Assessment/Plan: Right hip periprosthetic fluid collection  We discussed his findings on imaging and his exam at length.  He has minimal hip pain presently and has intact range of motion that is not painful.  He says this is not outside his baseline.  He says his implant is nearly 87 years old.  He may have some wear-and-tear of the implant and certainly particle disease as commented on by radiology on his CT scan is a possibility.  Clinically, does not seem to be of infectious nature.  He has no convincing signs or symptoms currently in his right hip that would suggest that it is contributing to his sepsis and pneumonia.  As of now, we recommend watchful waiting and monitoring.  If he fails to improve or develops hip pain that is concerning for infectious etiology he may benefit from aspiration of the fluid collection for further analysis.  This may be done through interventional radiology.  Otherwise, we recommend outpatient follow-up.  Patient voiced understanding of this.  Once his MRI scan is read this may be helpful as  well to determine etiology.  Verdon Cummins  J [place] 01/10/2023, 9:52 AM

## 2023-01-10 NOTE — Progress Notes (Signed)
PHARMACY - ANTICOAGULATION CONSULT NOTE  Pharmacy Consult for heparin  Indication:  hx atrial fibrillation (PTA Eliquis on hold)  Allergies  Allergen Reactions   Septra [Sulfamethoxazole-Trimethoprim] Itching   Cefadroxil Other (See Comments)    Unknown reaction    Ciprofloxacin Other (See Comments)    Unknown action     Erythromycin Other (See Comments)    Upsets the stomach     Patient Measurements: Height: 5\' 5"  (165.1 cm) Weight: 84.4 kg (186 lb) IBW/kg (Calculated) : 61.5 Heparin Dosing Weight: 79 kg  Vital Signs: Temp: 98.1 F (36.7 C) (11/11 1152) Temp Source: Oral (11/11 1152) BP: 127/39 (11/11 1403) Pulse Rate: 102 (11/11 1100)  Labs: Recent Labs    01/09/23 1710 01/09/23 1717 01/10/23 0256  HGB 10.4* 12.2* 10.3*  HCT 33.6* 36.0* 35.4*  PLT 255  --  227  APTT 39*  --   --   LABPROT 18.7*  --   --   INR 1.5*  --   --   CREATININE 1.29* 1.40* 1.28*    Estimated Creatinine Clearance: 36.8 mL/min (A) (by C-G formula based on SCr of 1.28 mg/dL (H)).   Medical History: Past Medical History:  Diagnosis Date   Abdominal pain    Acute renal failure (HCC)     resolved   Aortic stenosis    Arthritis    "hands & legs" (11/'10/2014)   Ataxia    Bladder outlet obstruction    BPH (benign prostatic hyperplasia)    CAD (coronary artery disease)    a. CABG IN 1989. b. 01/08/2015 CTO of ost LAD, LIMA to LAD not visualized but assumed patent given myoview finding, occluded SVG to diagonal, 99% mid RCA tx w/ SYNERGY DES 3X28 mm   Chronic heart failure with preserved ejection fraction (HFpEF) (HCC)    Constipation    Diabetes mellitus type 2, insulin dependent (HCC)    Epistaxis, recurrent 04/2015   GERD (gastroesophageal reflux disease)    HTN (hypertension)    Hx of bacterial pneumonia    Hyperlipemia    Kidney stones    Mild cognitive impairment 03/05/2021   MMSE 26/30 and 6CIT 9 03/2021 AWV   Permanent atrial fibrillation (HCC)    Pneumonia 04/2019    Rib fractures    left rib fractures being treated with pain medications   Thoracic aortic aneurysm (HCC)    Urinary tract infection     Enterococcus    Medications:  - on Eliquis 5mg  bid PTA (last dose taken on 01/09/23 at 0800)  Assessment: Patient is a 89 y.o M with hx afib on Eliquis PTA who presented to the ED on 01/09/23 with c/o generalized weakness. Abd CT showed multifocal pneumonia and right hip periprosthetic fluid collection. Eliquis held on admission in case surgical intervention is needed by ortho. Pharmacy has been consulted on 01/10/23 to start heparin drip for patient for afib with RVR and while Eliquis is on hold.   Today, 01/10/2023: - cbc ok - scr 1.28 (crcl~37)  Goal of Therapy:  Heparin level 0.3-0.7 units/ml aPTT 66-102 seconds Monitor platelets by anticoagulation protocol: Yes   Plan:  - check baseline heparin level and aPTT - heparin drip at 1100 units/hr - check 8 hr heparin level and aPTT - monitor for s/sx bleeding   [name] P 01/10/2023,4:34 PM

## 2023-01-10 NOTE — Plan of Care (Signed)
  Problem: Activity: Goal: Ability to tolerate increased activity will improve Outcome: Progressing   Problem: Clinical Measurements: Goal: Ability to maintain a body temperature in the normal range will improve Outcome: Progressing   Problem: Respiratory: Goal: Ability to maintain adequate ventilation will improve Outcome: Progressing Goal: Ability to maintain a clear airway will improve Outcome: Progressing   Problem: Education: Goal: Ability to describe self-care measures that may prevent or decrease complications (Diabetes Survival Skills Education) will improve Outcome: Progressing Goal: Individualized Educational Video(s) Outcome: Progressing   Problem: Coping: Goal: Ability to adjust to condition or change in health will improve Outcome: Progressing   Problem: Fluid Volume: Goal: Ability to maintain a balanced intake and output will improve Outcome: Progressing   Problem: Health Behavior/Discharge Planning: Goal: Ability to identify and utilize available resources and services will improve Outcome: Progressing Goal: Ability to manage health-related needs will improve Outcome: Progressing   Problem: Metabolic: Goal: Ability to maintain appropriate glucose levels will improve Outcome: Progressing   Problem: Nutritional: Goal: Maintenance of adequate nutrition will improve Outcome: Progressing Goal: Progress toward achieving an optimal weight will improve Outcome: Progressing   Problem: Skin Integrity: Goal: Risk for impaired skin integrity will decrease Outcome: Progressing   Problem: Tissue Perfusion: Goal: Adequacy of tissue perfusion will improve Outcome: Progressing   Problem: Fluid Volume: Goal: Hemodynamic stability will improve Outcome: Progressing   Problem: Clinical Measurements: Goal: Diagnostic test results will improve Outcome: Progressing Goal: Signs and symptoms of infection will decrease Outcome: Progressing   Problem:  Respiratory: Goal: Ability to maintain adequate ventilation will improve Outcome: Progressing   Problem: Education: Goal: Knowledge of General Education information will improve Description: Including pain rating scale, medication(s)/side effects and non-pharmacologic comfort measures Outcome: Progressing   Problem: Health Behavior/Discharge Planning: Goal: Ability to manage health-related needs will improve Outcome: Progressing   Problem: Clinical Measurements: Goal: Ability to maintain clinical measurements within normal limits will improve Outcome: Progressing Goal: Will remain free from infection Outcome: Progressing Goal: Diagnostic test results will improve Outcome: Progressing Goal: Respiratory complications will improve Outcome: Progressing Goal: Cardiovascular complication will be avoided Outcome: Progressing   Problem: Activity: Goal: Risk for activity intolerance will decrease Outcome: Progressing   Problem: Nutrition: Goal: Adequate nutrition will be maintained Outcome: Progressing   Problem: Coping: Goal: Level of anxiety will decrease Outcome: Progressing   Problem: Elimination: Goal: Will not experience complications related to bowel motility Outcome: Progressing Goal: Will not experience complications related to urinary retention Outcome: Progressing   Problem: Pain Management: Goal: General experience of comfort will improve Outcome: Progressing   Problem: Safety: Goal: Ability to remain free from injury will improve Outcome: Progressing   Problem: Skin Integrity: Goal: Risk for impaired skin integrity will decrease Outcome: Progressing

## 2023-01-10 NOTE — Consult Note (Addendum)
Cardiology Consultation   Patient ID: [name] MRN: [id number]; DOB: Aug 29, 1928  Admit date: 01/09/2023 Date of Consult: 01/10/2023  PCP:  Jeffrey Ora, MD   [facility] Providers Cardiologist:  Jeffrey Swaziland, MD        Patient Profile:   [name] is a 89 y.o. male with a hx of CAD s/p CABG ~1990 and DES to RCA at last cath 2016, IDDM, HTN, CKD 3a by labs (baseline Cr 1.2-1.3), chronic HFpEF, permanent atrial fibrillation, prior bradycardia, GIB with ABL anemia 04/2022, ascending aortic aneurysm, pleural effusion s/p thoracentesis 2019, mild cognitive impairment, arthritis, BPH, CVA, mild carotid artery disease 2019, hiatal hernia who is being seen 01/10/2023 for the evaluation of atrial fibrillation at the request of Dr. Isidoro [name].  History of Present Illness:   [name] carries the above history with last admission 04/2022 for pre-syncope, suspected lower GIB, ABL anemia with EGD reassuring (moderate hiatal hernia) and colonoscopy demonstrating colon polyps and diverticulosis - GI felt iron deficiency anemia and heme positive stool likely due to chronic blood loss from large polyps. During admission he had adjustment of his medication due to bradycardia (HR 40s-60s) and AKI. He was able to resume anticoagulation. 2D echo 60-65%, mild LVH, RV not well visualized, severe LAE, moderate-severe aortic stenosis (suspected paradoxical LF/LG severe AS), mild aortic regurgitation, 40mm ascending aorta. was reported to have prior ascending thoracic aortic aneurysm though CT 09/2022 reported no aneurysmal dilation; Dr. Swaziland felt he would be a surgical candidate anyway. Most recent office HR 11/2022 was 96.  He presented to [facility] ED yesterday with 3-4 day history of decreased appetite, malaise, fatigue, generalized myalgias without recent CP or dyspnea. He was found to be hypoxic, febrile, and borderline hypotensive with AF RVR. CXR was concerning for PNA. CT of the abdomen pelvis  reveals multifocal pneumonia, large periprosthetic right hip fluid collection, and unchanged acetabular lesion. Labs were notable for hyponatremia of 129, leukocytosis 22k, Covid/flu neg. He was given IVF, abx, supplemental O2. Eliquis held in case orthopedic intervention needed. HR currently 110-120s, occasional dips to the mid 100s. He denies any medication nonadherence - is very careful with his med box.   Past Medical History:  Diagnosis Date   Abdominal pain    Acute renal failure (HCC)     resolved   Aortic stenosis    Arthritis    "hands & legs" (11/'10/2014)   Ataxia    Bladder outlet obstruction    BPH (benign prostatic hyperplasia)    CAD (coronary artery disease)    a. CABG IN 1989. b. 01/08/2015 CTO of ost LAD, LIMA to LAD not visualized but assumed patent given myoview finding, occluded SVG to diagonal, 99% mid RCA tx w/ SYNERGY DES 3X28 mm   Chronic heart failure with preserved ejection fraction (HFpEF) (HCC)    Constipation    Diabetes mellitus type 2, insulin dependent (HCC)    Epistaxis, recurrent 04/2015   GERD (gastroesophageal reflux disease)    HTN (hypertension)    Hx of bacterial pneumonia    Hyperlipemia    Kidney stones    Mild cognitive impairment 03/05/2021   MMSE 26/30 and 6CIT 9 03/2021 AWV   Permanent atrial fibrillation (HCC)    Pneumonia 04/2019   Rib fractures    left rib fractures being treated with pain medications   Thoracic aortic aneurysm (HCC)    Urinary tract infection     Enterococcus    Past Surgical History:  Procedure Laterality Date  CARDIAC CATHETERIZATION  1989   CARDIAC CATHETERIZATION N/A 01/08/2015   Procedure: Left Heart Cath and Cors/Grafts Angiography;  Surgeon: Jeffrey M Swaziland, MD;  Location: [facility] INVASIVE CV LAB;  Service: Cardiovascular;  Laterality: N/A;   CARDIAC CATHETERIZATION  01/08/2015   Procedure: Coronary Stent Intervention;  Surgeon: Jeffrey M Swaziland, MD;  Location: [facility] INVASIVE CV LAB;  Service: Cardiovascular;;    CARPAL TUNNEL RELEASE Right 08/2010   CATARACT EXTRACTION W/ INTRAOCULAR LENS  IMPLANT, BILATERAL Bilateral    COLONOSCOPY WITH PROPOFOL N/A 05/05/2022   Procedure: COLONOSCOPY WITH PROPOFOL;  Surgeon: Jeffrey Fredrickson, MD;  Location: [facility] ENDOSCOPY;  Service: Gastroenterology;  Laterality: N/A;   CORONARY ANGIOPLASTY  01/08/15   RCA DES   CORONARY ARTERY BYPASS GRAFT  1989   "CABG X 2"   ESOPHAGOGASTRODUODENOSCOPY (EGD) WITH PROPOFOL N/A 05/05/2022   Procedure: ESOPHAGOGASTRODUODENOSCOPY (EGD) WITH PROPOFOL;  Surgeon: Jeffrey Fredrickson, MD;  Location: [facility] ENDOSCOPY;  Service: Gastroenterology;  Laterality: N/A;   IR THORACENTESIS ASP PLEURAL SPACE W/IMG GUIDE  05/14/2019   JOINT REPLACEMENT     POLYPECTOMY  05/05/2022   Procedure: POLYPECTOMY;  Surgeon: Jeffrey Fredrickson, MD;  Location: [facility]COPY;  Service: Gastroenterology;;   TONSILLECTOMY     TOTAL HIP ARTHROPLASTY Right 2000     Home Medications:  Prior to Admission medications   Medication Sig Start Date End Date Taking? Authorizing Provider  apixaban (ELIQUIS) 5 MG TABS tablet Take 1 tablet (5 mg total) by mouth 2 (two) times daily. 05/09/22  Yes Sheikh, Omair Latif, DO  atorvastatin (LIPITOR) 40 MG tablet Take 1 tablet (40 mg total) by mouth at bedtime. 08/16/22  Yes Jeffrey Ora, MD  AVODART 0.5 MG capsule Take 0.5 mg by mouth every other day. 07/20/11  Yes [provider]  Cholecalciferol (VITAMIN D3 PO) Take 50 mcg by mouth daily.   Yes [provider]  empagliflozin (JARDIANCE) 25 MG TABS tablet Take 1 tablet (25 mg total) by mouth daily before breakfast. 09/03/20  Yes [name], Jeffrey M, MD  furosemide (LASIX) 40 MG tablet TAKE 1 TABLET(40 MG) BY MOUTH DAILY Patient taking differently: Take 40 mg by mouth in the morning. 08/31/22  Yes [name], Jeffrey M, MD  gabapentin (NEURONTIN) 300 MG capsule Take 2 capsules (600 mg total) by mouth at bedtime. 08/16/22  Yes Jeffrey Ora, MD  insulin glargine (LANTUS) 100 UNIT/ML injection Inject 38  Units into the skin every morning.   Yes [provider]  iron polysaccharides (NIFEREX) 150 MG capsule Take 1 capsule (150 mg total) by mouth daily. 05/06/22  Yes Sheikh, Omair Latif, DO  meloxicam (MOBIC) 7.5 MG tablet Take 7.5 mg by mouth daily as needed. 01/07/23  Yes [provider]  metFORMIN (GLUCOPHAGE) 500 MG tablet Take 500 mg by mouth 2 (two) times daily with a meal.   Yes [provider]  methocarbamol (ROBAXIN) 500 MG tablet Take 1,000 mg by mouth 3 (three) times daily as needed. 01/07/23  Yes [provider]  metoprolol tartrate (LOPRESSOR) 50 MG tablet Take 1 tablet (50 mg total) by mouth 2 (two) times daily. 07/15/22  Yes [name], Jeffrey M, MD  pantoprazole (PROTONIX) 40 MG tablet TAKE 1 TABLET BY MOUTH DAILY AT NOON Patient taking differently: Take 40 mg by mouth daily at 12 noon. 06/03/22  Yes Monge, Petra Kuba, NP  tamsulosin (FLOMAX) 0.4 MG CAPS capsule Take 0.4 mg by mouth daily. 09/28/22  Yes [provider]  traMADol (ULTRAM) 50 MG tablet  Take 1 tablet (50 mg total) by mouth 2 (two) times daily as needed. 12/07/22  Yes Jeffrey Ora, MD  TYLENOL 500 MG tablet Take 500-1,000 mg by mouth every 6 (six) hours as needed for mild pain or headache.   Yes [provider]    Inpatient Medications: Scheduled Meds:  Chlorhexidine Gluconate Cloth  6 each Topical Daily   [START ON 01/11/2023] dutasteride  0.5 mg Oral QODAY   insulin aspart  0-5 Units Subcutaneous QHS   insulin aspart  0-9 Units Subcutaneous TID WC   insulin glargine-yfgn  20 Units Subcutaneous Daily   metoprolol tartrate  50 mg Oral BID   pantoprazole  40 mg Oral Q1200   sodium chloride flush  3 mL Intravenous Q12H   [START ON 01/11/2023] tamsulosin  0.4 mg Oral QODAY   Continuous Infusions:  azithromycin Stopped (01/10/23 0525)   cefTRIAXone (ROCEPHIN)  IV Stopped (01/10/23 0619)   PRN Meds: acetaminophen **OR** acetaminophen, guaiFENesin-dextromethorphan,  levalbuterol, ondansetron **OR** ondansetron (ZOFRAN) IV, senna-docusate  Allergies:    Allergies  Allergen Reactions   Septra [Sulfamethoxazole-Trimethoprim] Itching   Cefadroxil Other (See Comments)    Unknown reaction    Ciprofloxacin Other (See Comments)    Unknown action     Erythromycin Other (See Comments)    Upsets the stomach     Social History:   Social History   Socioeconomic History   Marital status: Married    Spouse name: Not on file   Number of children: 3   Years of education: Not on file   Highest education level: Not on file  Occupational History   Occupation: Insurance underwriter   Occupation: retired  Tobacco Use   Smoking status: Former    Current packs/day: 0.00    Average packs/day: 3.0 packs/day for 10.0 years (30.0 ttl pk-yrs)    Types: Cigarettes    Start date: 05/10/1948    Quit date: 05/11/1958    Years since quitting: 64.7   Smokeless tobacco: Never  Vaping Use   Vaping status: Never Used  Substance and Sexual Activity   Alcohol use: No   Drug use: No   Sexual activity: Not Currently  Other Topics Concern   Not on file  Social History Narrative   Previously lived in [place], [place]    Children currently living in [place], [place]    Social Determinants of Health   Financial Resource Strain: Low Risk  (03/11/2022)   Overall Financial Resource Strain (CARDIA)    Difficulty of Paying Living Expenses: Not hard at all  Food Insecurity: No Food Insecurity (01/09/2023)   Hunger Vital Sign    Worried About Running Out of Food in the Last Year: Never true    Ran Out of Food in the Last Year: Never true  Transportation Needs: No Transportation Needs (01/09/2023)   PRAPARE - Administrator, Civil Service (Medical): No    Lack of Transportation (Non-Medical): No  Physical Activity: Inactive (03/11/2022)   Exercise Vital Sign    Days of Exercise per Week: 0 days    Minutes of Exercise per Session: 0 min  Stress: No Stress Concern  Present (03/11/2022)   [employer] of Occupational Health - Occupational Stress Questionnaire    Feeling of Stress : Not at all  Social Connections: Moderately Isolated (03/11/2022)   Social Connection and Isolation Panel [NHANES]    Frequency of Communication with Friends and Family: More than three times a week    Frequency of Social  Gatherings with Friends and Family: More than three times a week    Attends Religious Services: Never    Database administrator or Organizations: No    Attends Banker Meetings: Never    Marital Status: Married  Catering manager Violence: Not At Risk (01/09/2023)   Humiliation, Afraid, Rape, and Kick questionnaire    Fear of Current or Ex-Partner: No    Emotionally Abused: No    Physically Abused: No    Sexually Abused: No    Family History:   Family History  Problem Relation Age of Onset   Brain cancer Father    Throat cancer Brother    Liver disease Neg Hx    Colon cancer Neg Hx    Esophageal cancer Neg Hx      ROS:  Please see the history of present illness.  All other ROS reviewed and negative.     Physical Exam/Data:   Vitals:   01/10/23 0853 01/10/23 1100 01/10/23 1152 01/10/23 1403  BP: (!) 120/47 109/64  (!) 127/39  Pulse: (!) 124 (!) 102    Resp:  18    Temp:   98.1 F (36.7 C)   TempSrc:   Oral   SpO2:  99%    Weight:      Height:        Intake/Output Summary (Last 24 hours) at 01/10/2023 1614 Last data filed at 01/10/2023 1053 Gross per 24 hour  Intake 6 ml  Output --  Net 6 ml      01/09/2023   10:00 PM 01/09/2023    5:20 PM 12/14/2022    3:09 PM  Last 3 Weights  Weight (lbs) 186 lb 186 lb 186 lb  Weight (kg) 84.369 kg 84.369 kg 84.369 kg     Body mass index is 30.95 kg/m.  General: Well developed, well nourished, in no acute distress. Head: Normocephalic, atraumatic, sclera non-icteric, no xanthomas, nares are without discharge. Neck: Negative for carotid bruits. JVP not elevated. Lungs:  Coarse, diffusely diminished. No rales, wheezing or overt rhonchi. Breathing is unlabored. Heart: Irregularly irregular, variable S1-S2, soft SEM without rubs or gallops.  Abdomen: Soft, non-tender, non-distended with normoactive bowel sounds. No rebound/guarding. Extremities: No clubbing or cyanosis. No edema. Distal pedal pulses are 2+ and equal bilaterally. Neuro: Alert and oriented X 3. Moves all extremities spontaneously. Psych:  Responds to questions appropriately with a normal affect.  EKG:  The EKG was personally reviewed and demonstrates:  atrial fibrillation 143bpm, nonspecific STTW changes with mild ST depression I, II, III, avF, V4-V6  Telemetry:  Telemetry was personally reviewed and demonstrates:  AF RVR 1teens-120s  Relevant CV Studies: 2D echo 04/2022  1. Left ventricular ejection fraction, by estimation, is 60 to 65%. The  left ventricle has normal function. The left ventricle has no regional  wall motion abnormalities. There is mild left ventricular hypertrophy.  Left ventricular diastolic parameters  are indeterminate.   2. Right ventricular systolic function was not well visualized. The right  ventricular size is not well visualized. RV is poorly visualized but  function appears moderately reduced   3. The mitral valve is normal in structure. No evidence of mitral valve  regurgitation. No evidence of mitral stenosis.   4. Left atrial size was severely dilated.   5. Aortic dilatation noted. There is dilatation of the ascending aorta,  measuring 40 mm.   6. The aortic valve is tricuspid. There is severe calcification of the  aortic valve. Aortic valve  regurgitation is mild. Moderate to severe  aortic valve stenosis. Moderate AS by gradients (MG 24 mmHg, Vmax 3.0  m/s), severe by AVA (0.7cm^2) and DI  (0.24). Low SV index (28 cc/m^2), suspect paradoxical low flow low  gradient severe AS    Laboratory Data:  High Sensitivity Troponin:  No results for input(s):  "TROPONINIHS" in the last 720 hours.   Chemistry Recent Labs  Lab 01/09/23 1710 01/09/23 1717 01/10/23 0256  NA 129* 133* 131*  K 3.5 3.5 3.3*  CL 94* 95* 96*  CO2 23  --  22  GLUCOSE 218* 207* 180*  BUN 24* 22 24*  CREATININE 1.29* 1.40* 1.28*  CALCIUM 8.9  --  8.8*  MG  --   --  2.0  GFRNONAA 52*  --  53*  ANIONGAP 12  --  13    Recent Labs  Lab 01/09/23 1710  PROT 7.1  ALBUMIN 3.4*  AST 18  ALT 14  ALKPHOS 62  BILITOT 1.5*   Lipids No results for input(s): "CHOL", "TRIG", "HDL", "LABVLDL", "LDLCALC", "CHOLHDL" in the last 168 hours.  Hematology Recent Labs  Lab 01/09/23 1710 01/09/23 1717 01/10/23 0256  WBC 22.4*  --  23.9*  RBC 4.50  --  4.58  HGB 10.4* 12.2* 10.3*  HCT 33.6* 36.0* 35.4*  MCV 74.7*  --  77.3*  MCH 23.1*  --  22.5*  MCHC 31.0  --  29.1*  RDW 17.7*  --  18.1*  PLT 255  --  227   Thyroid  Recent Labs  Lab 01/09/23 1710  TSH 1.429    BNP Recent Labs  Lab 01/10/23 1244  BNP 472.9*    DDimer No results for input(s): "DDIMER" in the last 168 hours.   Radiology/Studies:  MR HIP RIGHT W WO CONTRAST  Result Date: 01/10/2023 CLINICAL DATA:  Right hip periprosthetic fluid collection. Denies significant hip pain. EXAM: MRI OF THE RIGHT HIP WITHOUT AND WITH CONTRAST TECHNIQUE: Multiplanar, multisequence MR imaging was performed both before and after administration of intravenous contrast. CONTRAST:  6mL GADAVIST GADOBUTROL 1 MMOL/ML IV SOLN COMPARISON:  CT abdomen pelvis from yesterday. CT abdomen pelvis dated April 21, 2022, and November 28, 2010. FINDINGS: Bones: Prior right total hip arthroplasty with associated susceptibility artifact. Expansile mixed signal intensity replacing normal marrow in the right greater trochanter and right anterior and posterior acetabulum, with associated cortical breakthrough. Large thick-walled T2 and T1 hyperintense fluid collection with hypointense rim surrounding the right hip arthroplasty, measuring  approximately 7.7 x 8.3 x 14.0 cm (AP by transverse by CC). There is no evidence of acute fracture, dislocation or avascular necrosis. The visualized sacroiliac joints and symphysis pubis appear normal. Articular cartilage and labrum Articular cartilage: Areas of high-grade partial-thickness cartilage loss in the superior left hip joint with subchondral cystic change in the acetabulum. Labrum: Small paralabral cyst along the superior left labrum, suspicious for underlying labral tear. Joint or bursal effusion Joint effusion: No significant left hip joint effusion. Bursae: Small amount of fluid in the right iliopsoas bursa. Muscles and tendons Muscles and tendons: The visualized gluteus, hamstring and iliopsoas tendons appear intact. No muscle edema. Right-greater-than-left gluteus minimus and medius muscle atrophy. Asymmetric atrophy of the right piriformis muscle. Other findings Miscellaneous: Unchanged moderate prostatomegaly. IMPRESSION: 1. 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 2. Expansile mixed signal intensity replacing normal marrow in the right greater trochanter and right anterior and posterior acetabulum  with associated cortical breakthrough, also consistent with particle disease. 3. Mild right iliopsoas bursitis. 4. Mild to moderate left hip osteoarthritis. Electronically Signed   By: Obie Dredge M.D.   On: 01/10/2023 12:01   DG Chest Port 1 View  Result Date: 01/09/2023 CLINICAL DATA:  Questionable sepsis, weakness and feeling sick, shortness of breath EXAM: PORTABLE CHEST 1 VIEW COMPARISON:  11/30/2022 FINDINGS: Hazy airspace and interstitial opacities in the left mid and lower lung compatible with pneumonia. No pneumothorax. Cardiomegaly and pulmonary vascular congestion. Sternotomy and CABG. Aortic atherosclerotic calcification. IMPRESSION: 1. Left mid and lower lung pneumonia. Follow-up in 6-8  weeks after treatment is recommended to ensure resolution. 2. Cardiomegaly and pulmonary vascular congestion. Electronically Signed   By: Minerva Fester M.D.   On: 01/09/2023 19:31   CT ABDOMEN PELVIS W CONTRAST  Result Date: 01/09/2023 CLINICAL DATA:  Right lower quadrant abdominal pain. Weakness and feeling sick. EXAM: CT ABDOMEN AND PELVIS WITH CONTRAST TECHNIQUE: Multidetector CT imaging of the abdomen and pelvis was performed using the standard protocol following bolus administration of intravenous contrast. RADIATION DOSE REDUCTION: This exam was performed according to the departmental dose-optimization program which includes automated exposure control, adjustment of the mA and/or kV according to patient size and/or use of iterative reconstruction technique. CONTRAST:  OMNIPAQUE IOHEXOL 300 MG/ML  SOLN COMPARISON:  CT abdomen and pelvis 04/21/2022 FINDINGS: Lower chest: Partially visualized consolidation in the left lower lobe. Small left pleural effusion. Right basilar atelectasis or infiltrates. Hepatobiliary: Cholelithiasis without evidence of acute cholecystitis. Unremarkable liver and biliary tree. Pancreas: Unremarkable. Spleen: Unremarkable. Adrenals/Urinary Tract: Normal adrenal glands. 2 mm nonobstructing right nephrolithiasis. No obstructing ureteral calculi or hydronephrosis. Unremarkable bladder. Stomach/Bowel: Normal caliber large and small bowel. No bowel wall thickening. Appendix is normal. Small hiatal hernia. Vascular/Lymphatic: Advanced aortic atherosclerotic calcification. No lymphadenopathy. Reproductive: Prostatomegaly. Other: No free intraperitoneal fluid or air. Musculoskeletal: Right THA. Expansile lesion with cortical breakthrough in the posterior acetabulum measuring 6.2 x 4.3 cm. Large periprosthetic fluid collection is partially obscured by streak artifact from the right THA and extends below the inferior aspect of the exam. This measures approximately 8.8 x 5.6 cm in  the axial plane. IMPRESSION: 1. Pulmonary findings compatible with multifocal pneumonia. 2. Large periprosthetic fluid collection is partially obscured by streak artifact from the right THA and extends below the inferior aspect of the exam. This measures approximately 8.8 x 5.6 cm in the axial plane. 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 If there is concern for infection, fluid sampling is recommended. 3. Unchanged expansile lesion with cortical breakthrough in the posterior acetabulum measuring 6.2 x 4.3 cm. Consider MRI for further evaluation 4. Cholelithiasis without evidence of acute cholecystitis. 5. 2 mm nonobstructing right nephrolithiasis. 6. Enlarged prostate. Aortic Atherosclerosis (ICD10-I70.0). Electronically Signed   By: Minerva Fester M.D.   On: 01/09/2023 19:29     Assessment and Plan:   1. Suspected sepsis due to pneumonia with acute hypoxic respiratory failure - also with additional issues of hyponatremia and right hip periprosthetic fluid collection; acetabular lesion pending ortho eval - per primary team  2. Permanent atrial fibrillation here with RVR - since AF is known to be permanent, suspect elevated rates due to acute illness - Eliquis on hold in case ortho intervention needed, consider heparin per pharmacy if prolonged expectation off anticoagulation - on metoprolol 50mg  BID with prior history of bradycardia as well, most recent OP HR 96bpm 11/2022 (baseline appears 80s-90s) - HR occasionally dipping down to low 100s,  1-teens. Have asked nurse to go ahead and give 10am dose of metoprolol early to see the effect - suspect HR will continue to improve with treatment of #1 but if it does not, we can consider temporary titration of beta blocker +/- short term diltiazem - no plans for DCCV given that afib is permanent/longstanding - add TSH to labs - primary team managing electrolytes  3. Chronic HFpEF - CXR showed suggestion of vascular congestion but also superimposed  on PNA - appears compensated, OK to hold off on diuretics given sepsis and recent PO intake, follow volume status and anticipate early restart when BP stabilized  4. Moderate-severe aortic stenosis - follow clinically  4. CAD s/p CABG 1990, DES to RCA 2016 - no recent anginal symptoms, not on ASA due to concomitant Eliquis  5. Anemia - appears at baseline  Remainder of medical issues per primary team  Risk Assessment/Risk Scores:        New York Heart Association (NYHA) Functional Class NYHA Class III-IV on arrival in setting of PNA  CHA2DS2-VASc Score = 8   This indicates a 10.8% annual risk of stroke. The patient's score is based upon: CHF History: 1 HTN History: 1 Diabetes History: 1 Stroke History: 2 Vascular Disease History: 1 Age Score: 2 Gender Score: 0   For questions or updates, please contact [facility] Please consult [email or web address] for contact info under    Signed, Laurann Montana, [name]  01/10/2023 8:38 AM  ADDENDUM:  Patient seen and examined with Ronie Spies PA.  I personally taken a history, examined the patient, reviewed relevant notes,  laboratory data / imaging studies.  I performed a substantive portion of this encounter and formulated the important aspects of the plan.  I agree with the APP's note, impression, and recommendations; however, I have edited the note to reflect changes or salient points (which are noted in italics).  I have personally discussed the plan with the patient.  Very pleasant 89 year old Caucasian gentleman who presents to the hospital due to malaise, decreased appetite, and generalized myalgias.  Patient was diagnosed with multifocal pneumonia meeting sepsis criteria.  In addition, he is noted to have an incidental finding of right hip periprosthetic fluid collection for which orthopedic has been consulted for.  Cardiology was consulted during this hospitalization for management of his atrial fibrillation.  Patient  has known history of what appears to be permanent A-fib and due to the acute illness he was in A-fib with RVR on arrival to the ED.  Clinically denies anginal chest pain or heart failure symptoms.  In the past he had an episode of bradycardia and therefore the dose of metoprolol was reduced to 50 mg p.o. twice daily.  His anticoagulation is currently held due to concerns for possible need for orthopedic intervention given the periprosthetic fluid collection.  PHYSICAL EXAM:    01/10/2023    2:03 PM 01/10/2023   11:00 AM 01/10/2023    8:53 AM  Vitals with BMI  Systolic 127 109 284  Diastolic 39 64 47  Pulse  102 132    Net IO Since Admission: 6 mL [01/10/23 1614]  Filed Weights   01/09/23 1720 01/09/23 2200  Weight: 84.4 kg 84.4 kg    Physical Exam  Constitutional: No distress.  hemodynamically stable  Neck: No JVD present.  Cardiovascular: S1 normal and S2 normal. An irregularly irregular rhythm present. Tachycardia present. Exam reveals no gallop, no S3 and no S4.  Murmur heard. Crescendo-decrescendo systolic murmur  is present with a grade of 3/6 at the upper right sternal border. Pulmonary/Chest: Effort normal. No stridor. He has no wheezes. He has no rales.  Rhonchi's noted bilaterally.  Abdominal: Soft. Bowel sounds are normal. He exhibits no distension. There is no abdominal tenderness.  Musculoskeletal:        General: No edema.     Cervical back: Neck supple.  Neurological: He is alert and oriented to person, place, and time. He has intact cranial nerves (2-12).  Skin: Skin is warm.    12 lead EKG was personally reviewed by me: 01/09/2023: A-fib with RVR, 140 bpm, minimal ST depressions in inferolateral leads, without underlying injury pattern  Telemetry: Atrial fibrillation-ventricular rates overall controlled but at times RVR   Assessment/Plan: A-fib with rapid ventricular rate -known history of permanent A-fib -Likely precipitated by sepsis/multifocal  pneumonia -Would recommend treating the underlying cause and the ventricular rate should improve. -Continue home dose metoprolol. -High CHA2DS2-VASc score -if no orthopedic procedures/surgeries scheduled recommend starting anticoagulation.  Reached out to orthopedic APP no plans for intervention at this time.  Shared decision was to start IV heparin drip per A-fib protocol to prevent/reduce the risk of thromboembolic events.  Once it is known and no orthopedic intervention is needed transition to oral medications. -Continue telemetry -Monitor for now  Sepsis due to multifocal pneumonia. -Currently on antibiotics -Management per primary team  Long-term oral anticoagulation. -Indication permanent atrial fibrillation. -Does not endorse evidence of bleeding.  Chronic HFpEF. -Overall euvolemic on physical examination. -LVEF preserved as per the last echo-May utilize fluids per sepsis protocol if needed -Check BNP -Will readdress home medications closer to discharge.  Moderate to severe aortic stenosis-paradoxical low-flow low gradient. -Monitor for now  Coronary artery disease without angina pectoris/CABG 1998/DES to the RCA 2016 -EKG does not illustrate STEMI. -Denies angina pectoris -Reemphasized the importance of secondary prevention with focus on improving her modifiable cardiovascular risk factors such as glycemic control, lipid management, blood pressure control, weight loss.  Further recommendations to follow as the case evolves.   This note was created using a voice recognition software as a result there may be grammatical errors inadvertently enclosed that do not reflect the nature of this encounter. Every attempt is made to correct such errors.   Tessa Lerner, DO, [facility]  [facility]  [address] [place], [place] [address] 01/10/2023 4:14 PM

## 2023-01-11 ENCOUNTER — Inpatient Hospital Stay (HOSPITAL_COMMUNITY): Payer: Medicare Other

## 2023-01-11 ENCOUNTER — Other Ambulatory Visit: Payer: Medicare Other | Admitting: *Deleted

## 2023-01-11 DIAGNOSIS — J189 Pneumonia, unspecified organism: Secondary | ICD-10-CM | POA: Diagnosis not present

## 2023-01-11 DIAGNOSIS — R188 Other ascites: Secondary | ICD-10-CM | POA: Diagnosis not present

## 2023-01-11 DIAGNOSIS — I2510 Atherosclerotic heart disease of native coronary artery without angina pectoris: Secondary | ICD-10-CM | POA: Diagnosis not present

## 2023-01-11 DIAGNOSIS — I482 Chronic atrial fibrillation, unspecified: Secondary | ICD-10-CM | POA: Diagnosis not present

## 2023-01-11 DIAGNOSIS — A419 Sepsis, unspecified organism: Secondary | ICD-10-CM | POA: Diagnosis not present

## 2023-01-11 DIAGNOSIS — I4891 Unspecified atrial fibrillation: Secondary | ICD-10-CM | POA: Diagnosis not present

## 2023-01-11 LAB — BASIC METABOLIC PANEL
Anion gap: 14 (ref 5–15)
BUN: 24 mg/dL — ABNORMAL HIGH (ref 8–23)
CO2: 21 mmol/L — ABNORMAL LOW (ref 22–32)
Calcium: 8.8 mg/dL — ABNORMAL LOW (ref 8.9–10.3)
Chloride: 98 mmol/L (ref 98–111)
Creatinine, Ser: 1.19 mg/dL (ref 0.61–1.24)
GFR, Estimated: 57 mL/min — ABNORMAL LOW (ref >60)
Glucose, Bld: 165 mg/dL — ABNORMAL HIGH (ref 70–99)
Potassium: 3.2 mmol/L — ABNORMAL LOW (ref 3.5–5.1)
Sodium: 133 mmol/L — ABNORMAL LOW (ref 135–145)

## 2023-01-11 LAB — CBC
HCT: 29.8 % — ABNORMAL LOW (ref 39.0–52.0)
Hemoglobin: 9.2 g/dL — ABNORMAL LOW (ref 13.0–17.0)
MCH: 22.9 pg — ABNORMAL LOW (ref 26.0–34.0)
MCHC: 30.9 g/dL (ref 30.0–36.0)
MCV: 74.1 fL — ABNORMAL LOW (ref 80.0–100.0)
Platelets: 238 10*3/uL (ref 150–400)
RBC: 4.02 MIL/uL — ABNORMAL LOW (ref 4.22–5.81)
RDW: 17.9 % — ABNORMAL HIGH (ref 11.5–15.5)
WBC: 21.7 10*3/uL — ABNORMAL HIGH (ref 4.0–10.5)
nRBC: 0 % (ref 0.0–0.2)

## 2023-01-11 LAB — GLUCOSE, CAPILLARY
Glucose-Capillary: 144 mg/dL — ABNORMAL HIGH (ref 70–99)
Glucose-Capillary: 157 mg/dL — ABNORMAL HIGH (ref 70–99)
Glucose-Capillary: 214 mg/dL — ABNORMAL HIGH (ref 70–99)
Glucose-Capillary: 184 mg/dL — ABNORMAL HIGH (ref 70–99)

## 2023-01-11 LAB — TSH: TSH: 2.093 u[IU]/mL (ref 0.350–4.500)

## 2023-01-11 LAB — FOLATE: Folate: 12 ng/mL (ref >5.9)

## 2023-01-11 LAB — AMMONIA: Ammonia: 15 umol/L (ref 9–35)

## 2023-01-11 LAB — APTT
aPTT: 46 s — ABNORMAL HIGH (ref 24–36)
aPTT: 48 s — ABNORMAL HIGH (ref 24–36)

## 2023-01-11 LAB — HEPARIN LEVEL (UNFRACTIONATED): Heparin Unfractionated: >1.1 [IU]/mL — ABNORMAL HIGH (ref 0.30–0.70)

## 2023-01-11 LAB — VITAMIN B12: Vitamin B-12: 777 pg/mL (ref 180–914)

## 2023-01-11 MED ORDER — HALOPERIDOL LACTATE 5 MG/ML IJ SOLN
1.0000 mg | Freq: Once | INTRAMUSCULAR | Status: AC | PRN
  Administered 2023-01-11: 1 mg via INTRAVENOUS
  Filled 2023-01-11: qty 1

## 2023-01-11 MED ORDER — APIXABAN 5 MG PO TABS
5.0000 mg | ORAL_TABLET | Freq: Two times a day (BID) | ORAL | Status: DC
  Administered 2023-01-11 – 2023-01-15 (×8): 5 mg via ORAL
  Filled 2023-01-11 (×8): qty 1

## 2023-01-11 MED ORDER — TRAZODONE HCL 50 MG PO TABS: 25.0000 mg | ORAL_TABLET | Freq: Every day | ORAL | Status: DC

## 2023-01-11 MED ORDER — METOPROLOL TARTRATE 25 MG PO TABS
50.0000 mg | ORAL_TABLET | Freq: Three times a day (TID) | ORAL | Status: DC
  Administered 2023-01-11 – 2023-01-13 (×7): 50 mg via ORAL
  Filled 2023-01-11 (×7): qty 2

## 2023-01-11 MED ORDER — GABAPENTIN 300 MG PO CAPS
300.0000 mg | ORAL_CAPSULE | Freq: Every day | ORAL | Status: DC
  Administered 2023-01-11 – 2023-01-14 (×4): 300 mg via ORAL
  Filled 2023-01-11 (×4): qty 1

## 2023-01-11 MED ORDER — INSULIN ASPART 100 UNIT/ML IJ SOLN
3.0000 [IU] | Freq: Three times a day (TID) | INTRAMUSCULAR | Status: DC
  Administered 2023-01-11 – 2023-01-13 (×7): 3 [IU] via SUBCUTANEOUS

## 2023-01-11 MED ORDER — HEPARIN BOLUS VIA INFUSION
2000.0000 [IU] | Freq: Once | INTRAVENOUS | Status: AC
  Administered 2023-01-11: 2000 [IU] via INTRAVENOUS
  Filled 2023-01-11: qty 2000

## 2023-01-11 MED ORDER — TRAZODONE HCL 50 MG PO TABS
25.0000 mg | ORAL_TABLET | Freq: Every evening | ORAL | Status: DC | PRN
  Administered 2023-01-12 – 2023-01-15 (×3): 25 mg via ORAL
  Filled 2023-01-11 (×4): qty 1

## 2023-01-11 MED ORDER — POTASSIUM CHLORIDE CRYS ER 20 MEQ PO TBCR
40.0000 meq | EXTENDED_RELEASE_TABLET | Freq: Once | ORAL | Status: AC
  Administered 2023-01-11: 40 meq via ORAL
  Filled 2023-01-11: qty 2

## 2023-01-11 MED ORDER — QUETIAPINE 12.5 MG HALF TABLET
12.5000 mg | ORAL_TABLET | ORAL | Status: DC
  Administered 2023-01-11: 12.5 mg via ORAL
  Filled 2023-01-11: qty 1

## 2023-01-11 NOTE — Progress Notes (Addendum)
Progress Note  Patient Name: [name] Date of Encounter: 01/11/2023  Primary Cardiologist: Peter Swaz[place], MD  Subjective   Denies any overt CP or SOB. Just feeling generally weak. He finds it difficult to say exactly what's bothering him. Nurse note reviewed overnight - some agitation, trying to exit the bed. Would likely benefit from PT/OT when OK with ortho. HR 90s-low 100s overnight, 1teens-120s this AM.  Inpatient Medications    Scheduled Meds:  Chlorhexidine Gluconate Cloth  6 each Topical Daily   dutasteride  0.5 mg Oral QODAY   insulin aspart  0-5 Units Subcutaneous QHS   insulin aspart  0-9 Units Subcutaneous TID WC   insulin glargine-yfgn  20 Units Subcutaneous Daily   metoprolol tartrate  50 mg Oral BID   pantoprazole  40 mg Oral Q1200   sodium chloride flush  3 mL Intravenous Q12H   tamsulosin  0.4 mg Oral QODAY   Continuous Infusions:  azithromycin Stopped (01/10/23 2223)   cefTRIAXone (ROCEPHIN)  IV Stopped (01/11/23 0450)   heparin 1,300 Units/hr (01/11/23 0515)   PRN Meds: acetaminophen **OR** acetaminophen, guaiFENesin-dextromethorphan, levalbuterol, ondansetron **OR** ondansetron (ZOFRAN) IV, senna-docusate   Vital Signs    Vitals:   01/11/23 0247 01/11/23 0300 01/11/23 0400 01/11/23 0500  BP:   (!) 100/41 (!) 123/40  Pulse: (!) 117 (!) 58 99 97  Resp: 15 (!) 27 (!) 27 (!) 26  Temp:   98.2 F (36.8 C)   TempSrc:   Axillary   SpO2: 99% 96% 96% 98%  Weight:      Height:        Intake/Output Summary (Last 24 hours) at 01/11/2023 0735 Last data filed at 01/11/2023 0515 Gross per 24 hour  Intake 1778.05 ml  Output 1200 ml  Net 578.05 ml      01/09/2023   10:00 PM 01/09/2023    5:20 PM 12/14/2022    3:09 PM  Last 3 Weights  Weight (lbs) 186 lb 186 lb 186 lb  Weight (kg) 84.369 kg 84.369 kg 84.369 kg     Telemetry    AFib rates above - Personally Reviewed  Physical Exam   GEN: No acute distress.  HEENT: Normocephalic,  atraumatic, sclera non-icteric. Neck: No JVD or bruits. Cardiac: irregularly irregular, tachycardic, soft SEM, no rubs or gallops.  Respiratory: Coarse BS but no overt wheezing, rales or rhonchi. Breathing is unlabored. GI: Soft, nontender, non-distended, BS +x 4. MS: no deformity. Extremities: No clubbing or cyanosis. No edema. Distal pedal pulses are 2+ and equal bilaterally. Neuro:  AAOx3. Follows commands. Psych:  Responds to questions appropriately with a normal affect.  Labs    High Sensitivity Troponin:  No results for input(s): "TROPONINIHS" in the last 720 hours.    Cardiac EnzymesNo results for input(s): "TROPONINI" in the last 168 hours. No results for input(s): "TROPIPOC" in the last 168 hours.   Chemistry Recent Labs  Lab 01/09/23 1710 01/09/23 1717 01/10/23 0256 01/11/23 0255  NA 129* 133* 131* 133*  K 3.5 3.5 3.3* 3.2*  CL 94* 95* 96* 98  CO2 23  --  22 21*  GLUCOSE 218* 207* 180* 165*  BUN 24* 22 24* 24*  CREATININE 1.29* 1.40* 1.28* 1.19  CALCIUM 8.9  --  8.8* 8.8*  PROT 7.1  --   --   --   ALBUMIN 3.4*  --   --   --   AST 18  --   --   --   ALT 14  --   --   --  ALKPHOS 62  --   --   --   BILITOT 1.5*  --   --   --   GFRNONAA 52*  --  53* 57*  ANIONGAP 12  --  13 14     Hematology Recent Labs  Lab 01/09/23 1710 01/09/23 1717 01/10/23 0256 01/11/23 0255  WBC 22.4*  --  23.9* 21.7*  RBC 4.50  --  4.58 4.02*  HGB 10.4* 12.2* 10.3* 9.2*  HCT 33.6* 36.0* 35.4* 29.8*  MCV 74.7*  --  77.3* 74.1*  MCH 23.1*  --  22.5* 22.9*  MCHC 31.0  --  29.1* 30.9  RDW 17.7*  --  18.1* 17.9*  PLT 255  --  227 238    BNP Recent Labs  Lab 01/10/23 1244  BNP 472.9*     DDimer No results for input(s): "DDIMER" in the last 168 hours.   Radiology    MR HIP RIGHT W WO CONTRAST  Result Date: 01/10/2023 CLINICAL DATA:  Right hip periprosthetic fluid collection. Denies significant hip pain. EXAM: MRI OF THE RIGHT HIP WITHOUT AND WITH CONTRAST TECHNIQUE:  Multiplanar, multisequence MR imaging was performed both before and after administration of intravenous contrast. CONTRAST:  6mL GADAVIST GADOBUTROL 1 MMOL/ML IV SOLN COMPARISON:  CT abdomen pelvis from yesterday. CT abdomen pelvis dated April 21, 2022, and November 28, 2010. FINDINGS: Bones: Prior right total hip arthroplasty with associated susceptibility artifact. Expansile mixed signal intensity replacing normal marrow in the right greater trochanter and right anterior and posterior acetabulum, with associated cortical breakthrough. Large thick-walled T2 and T1 hyperintense fluid collection with hypointense rim surrounding the right hip arthroplasty, measuring approximately 7.7 x 8.3 x 14.0 cm (AP by transverse by CC). There is no evidence of acute fracture, dislocation or avascular necrosis. The visualized sacroiliac joints and symphysis pubis appear normal. Articular cartilage and labrum Articular cartilage: Areas of high-grade partial-thickness cartilage loss in the superior left hip joint with subchondral cystic change in the acetabulum. Labrum: Small paralabral cyst along the superior left labrum, suspicious for underlying labral tear. Joint or bursal effusion Joint effusion: No significant left hip joint effusion. Bursae: Small amount of fluid in the right iliopsoas bursa. Muscles and tendons Muscles and tendons: The visualized gluteus, hamstring and iliopsoas tendons appear intact. No muscle edema. Right-greater-than-left gluteus minimus and medius muscle atrophy. Asymmetric atrophy of the right piriformis muscle. Other findings Miscellaneous: Unchanged moderate prostatomegaly. IMPRESSION: 1. 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 2. Expansile mixed signal intensity replacing normal marrow in the right greater trochanter and right anterior and posterior acetabulum with associated cortical breakthrough,  also consistent with particle disease. 3. Mild right iliopsoas bursitis. 4. Mild to moderate left hip osteoarthritis. Electronically Signed   By: Obie Dredge M.D.   On: 01/10/2023 12:01   DG Chest Port 1 View  Result Date: 01/09/2023 CLINICAL DATA:  Questionable sepsis, weakness and feeling sick, shortness of breath EXAM: PORTABLE CHEST 1 VIEW COMPARISON:  11/30/2022 FINDINGS: Hazy airspace and interstitial opacities in the left mid and lower lung compatible with pneumonia. No pneumothorax. Cardiomegaly and pulmonary vascular congestion. Sternotomy and CABG. Aortic atherosclerotic calcification. IMPRESSION: 1. Left mid and lower lung pneumonia. Follow-up in 6-8 weeks after treatment is recommended to ensure resolution. 2. Cardiomegaly and pulmonary vascular congestion. Electronically Signed   By: Minerva Fester M.D.   On: 01/09/2023 19:31   CT ABDOMEN PELVIS W CONTRAST  Result Date: 01/09/2023  CLINICAL DATA:  Right lower quadrant abdominal pain. Weakness and feeling sick. EXAM: CT ABDOMEN AND PELVIS WITH CONTRAST TECHNIQUE: Multidetector CT imaging of the abdomen and pelvis was performed using the standard protocol following bolus administration of intravenous contrast. RADIATION DOSE REDUCTION: This exam was performed according to the departmental dose-optimization program which includes automated exposure control, adjustment of the mA and/or kV according to patient size and/or use of iterative reconstruction technique. CONTRAST:  OMNIPAQUE IOHEXOL 300 MG/ML  SOLN COMPARISON:  CT abdomen and pelvis 04/21/2022 FINDINGS: Lower chest: Partially visualized consolidation in the left lower lobe. Small left pleural effusion. Right basilar atelectasis or infiltrates. Hepatobiliary: Cholelithiasis without evidence of acute cholecystitis. Unremarkable liver and biliary tree. Pancreas: Unremarkable. Spleen: Unremarkable. Adrenals/Urinary Tract: Normal adrenal glands. 2 mm nonobstructing right  nephrolithiasis. No obstructing ureteral calculi or hydronephrosis. Unremarkable bladder. Stomach/Bowel: Normal caliber large and small bowel. No bowel wall thickening. Appendix is normal. Small hiatal hernia. Vascular/Lymphatic: Advanced aortic atherosclerotic calcification. No lymphadenopathy. Reproductive: Prostatomegaly. Other: No free intraperitoneal fluid or air. Musculoskeletal: Right THA. Expansile lesion with cortical breakthrough in the posterior acetabulum measuring 6.2 x 4.3 cm. Large periprosthetic fluid collection is partially obscured by streak artifact from the right THA and extends below the inferior aspect of the exam. This measures approximately 8.8 x 5.6 cm in the axial plane. IMPRESSION: 1. Pulmonary findings compatible with multifocal pneumonia. 2. Large periprosthetic fluid collection is partially obscured by streak artifact from the right THA and extends below the inferior aspect of the exam. This measures approximately 8.8 x 5.6 cm in the axial plane. 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 If there is concern for infection, fluid sampling is recommended. 3. Unchanged expansile lesion with cortical breakthrough in the posterior acetabulum measuring 6.2 x 4.3 cm. Consider MRI for further evaluation 4. Cholelithiasis without evidence of acute cholecystitis. 5. 2 mm nonobstructing right nephrolithiasis. 6. Enlarged prostate. Aortic Atherosclerosis (ICD10-I70.0). Electronically Signed   By: Minerva Fester M.D.   On: 01/09/2023 19:29    Cardiac Studies   2d echo 04/2022   1. Left ventricular ejection fraction, by estimation, is 60 to 65%. The  left ventricle has normal function. The left ventricle has no regional  wall motion abnormalities. There is mild left ventricular hypertrophy.  Left ventricular diastolic parameters  are indeterminate.   2. Right ventricular systolic function was not well visualized. The right  ventricular size is not well visualized. RV is poorly  visualized but  function appears moderately reduced   3. The mitral valve is normal in structure. No evidence of mitral valve  regurgitation. No evidence of mitral stenosis.   4. Left atrial size was severely dilated.   5. Aortic dilatation noted. There is dilatation of the ascending aorta,  measuring 40 mm.   6. The aortic valve is tricuspid. There is severe calcification of the  aortic valve. Aortic valve regurgitation is mild. Moderate to severe  aortic valve stenosis. Moderate AS by gradients (MG 24 mmHg, Vmax 3.0  m/s), severe by AVA (0.7cm^2) and DI  (0.24). Low SV index (28 cc/m^2), suspect paradoxical low flow low  gradient severe AS    Patient Profile     89 y.o. male  with a hx of CAD s/p CABG ~1990 and DES to RCA at last cath 2016, moderate-severe aortic stenosis,  chronic HFpEF, permanent atrial fibrillation, prior bradycardia requiring dose adjustment, IDDM, HTN, CKD 3a by labs (baseline Cr 1.2-1.3), GIB with ABL anemia 04/2022, ascending aortic aneurysm, pleural effusion s/p  thoracentesis 2019, mild cognitive impairment, arthritis, BPH, CVA, mild carotid artery disease 2019, hiatal hernia   Assessment & Plan    1. Suspected sepsis due to pneumonia with acute hypoxic respiratory failure - also with additional issues of hyponatremia and right hip periprosthetic fluid collection; acetabular lesion pending ortho eval - per primary team   2. Permanent atrial fibrillation here with RVR - since AF is known to be permanent, suspect elevated rates due to acute illness - on metoprolol 50mg  BID with prior history of bradycardia as well, most recent OP HR 96bpm 11/2022 (baseline appears 80s-90s) - given that HR still suboptimally controlled, we'll bump up his metoprolol to 50mg  TID today with hold parameters  - anticipate continued rate control strategy without plan for DCCV given that afib is permanent/longstanding - Eliquis on hold in case ortho intervention needed, on heparin per  pharmacy pending completion of all procedures - please resume Eliquis when OK with primary team/ortho. Ortho plans to re-eval this afternoon - TSH wnl - primary team managing electrolytes -> hypokalemia, hyponatremia   3. Chronic HFpEF - CXR showed suggestion of vascular congestion but also superimposed on PNA - appears compensated, OK to hold off on diuretics given sepsis and recent PO intake, follow volume status and anticipate restart when BP stabilized   4. Moderate-severe aortic stenosis by last echo 04/2022 - continue to follow as outpatient   4. CAD s/p CABG 1990, DES to RCA 2016 - no recent anginal symptoms, not on ASA due to concomitant Eliquis   5. Anemia - Hgb appears at baseline  6. CKD 3a  - Cr remains at baseline   Remainder of medical issues per primary team  For questions or updates, please contact [facility] Please consult [email or web address] for contact info under Cardiology/STEMI.  Signed, Laurann Montana, [name] 01/11/2023,   ADDENDUM:   Patient seen and examined with APP, Dayna Dunn.  I personally taken a history, examined the patient, reviewed relevant notes,  laboratory data / imaging studies.  I performed a substantive portion of this encounter and formulated the important aspects of the plan.  I agree with the APP's note, impression, and recommendations.  Patient seen and examined at bedside at approximately 3 PM. Patient is taking a nap. Denies anginal chest pain or heart failure symptoms  PHYSICAL EXAM:    01/11/2023    2:00 PM 01/11/2023    1:00 PM 01/11/2023   12:00 PM  Vitals with BMI  Systolic  117 128  Diastolic  64 63  Pulse 117  119    Net IO Since Admission: 281.05 mL [01/11/23 1610]  Filed Weights   01/09/23 1720 01/09/23 2200  Weight: 84.4 kg 84.4 kg    Physical Exam  Constitutional:  Age appropriate, hemodynamically stable, no acute distress.   Neck: No JVD present.  Cardiovascular: S1 normal and S2 normal. An  irregularly irregular rhythm present. Tachycardia present. Exam reveals no gallop and no friction rub.  Murmur heard. Systolic murmur is present with a grade of 3/6 at the upper right sternal border. Pulmonary/Chest: Breath sounds normal. He has no wheezes. He has no rales. He exhibits no tenderness.  Abdominal: Soft. Bowel sounds are normal. He exhibits no distension. There is no abdominal tenderness.  Musculoskeletal:        General: No tenderness or edema.  Neurological: He is alert and oriented to person, place, and time.  Skin: Skin is warm and dry.    12 lead EKG was personally reviewed  by me: No new EKG  Telemetry: Atrial fibrillation with RVR -more recent tracings illustrated controlled ventricular rate with rare PVCs   Assessment/Plan: Atrial fibrillation with rapid ventricular rate Known history of permanent A-fib Exacerbated secondary to underlying sepsis/multifocal pneumonia Rate control: Metoprolol-dose increased as noted above Rhythm control: N/A Thromboembolic prophylaxis: Currently on IV heparin -will transition to Eliquis. Telemetry reviewed. Does not endorse evidence of bleeding.  Sepsis due to multifocal pneumonia: Continue antibiotics.  Chronic HFpEF: Euvolemic on physical examination, restart home medications closer to discharge.  Moderate to severe aortic stenosis Paradoxical low-flow low gradient Monitor for now  Coronary artery disease without angina/CABG 1998/drug-eluting stent to RCA in 2016 Denies angina pectoris. Reemphasized the importance of secondary prevention with focus on improving her modifiable cardiovascular risk factors such as glycemic control, lipid management, blood pressure control, weight loss.  Further recommendations to follow as the case evolves.   This note was created using a voice recognition software as a result there may be grammatical errors inadvertently enclosed that do not reflect the nature of this encounter. Every attempt  is made to correct such errors.   Tessa Lerner, DO, [facility]  [facility]  [address] [place], [place] [address] 01/11/2023 4:10 PM

## 2023-01-11 NOTE — Progress Notes (Signed)
     [name] is a 89 y.o. male   Orthopaedic diagnosis: Right hip periprosthetic fluid collection   Subjective: Patient resting comfortably in bed on arrival.  He just walked to the bathroom with nursing staff and tolerated that well.  Denies any significant hip pain with weightbearing activity. He denies any worsening pain or symptoms in the hip that are outside of his baseline.  Recall from previous, incidental finding on CT of the abdomen and pelvis showed fluid collection in the right hip.  He recently went MRI scan which redemonstrated a large fluid collection.  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  There is no obvious effusion in the hip or evidence of acute fractures.  Currently undergoing treatment for sepsis likely secondary to pneumonia.  Objectyive: Vitals:   01/11/23 1300 01/11/23 1400  BP: 117/64   Pulse:  (!) 117  Resp: (!) 34 (!) 25  Temp:    SpO2:  97%     Exam: Awake and alert Respirations even and unlabored No acute distress  Examination of the right hip shows minimal tenderness over the lateral aspect of the hip today.  There is no overlying warmth, erythema, or palpable fluctuance.  There is some bogginess to deep palpation anteriorly and laterally about the hip that is asymmetric to the contralateral side but does not elicit discomfort.  He has some stiffness passive internal and external rotation of the hip but no pain.  He has intact range of motion and strength otherwise throughout.  Warm and well-perfused distally with intact sensation.   Assessment: Right hip periprosthetic fluid collection   Plan: Patient continues to have minimal pain in the right hip that is not outside his baseline.  He does have a large fluid collection on imaging.  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 Clinically, does not seem to be of infectious nature.  He maintains intact range of motion and strength without pain and is able to weight  bear without significant difficulty.  He continues without convincing signs or symptoms currently in his right hip that would suggest that it is contributing to his sepsis and pneumonia.  He does not require any formal treatment currently for this right hip.  No plan for orthopedic surgery or aspiration.  He can resume his anticoagulation from our standpoint.  We recommend outpatient follow-up and it appears he is connected with a provider for this as he has had injections in the past.     Jeffrey Lia J. [place], [name]

## 2023-01-11 NOTE — Progress Notes (Signed)
PHARMACY - ANTICOAGULATION CONSULT NOTE  Pharmacy Consult for heparin  Indication:  hx atrial fibrillation (PTA Eliquis on hold)  Allergies  Allergen Reactions   Septra [Sulfamethoxazole-Trimethoprim] Itching   Cefadroxil Other (See Comments)    Unknown reaction    Ciprofloxacin Other (See Comments)    Unknown action     Erythromycin Other (See Comments)    Upsets the stomach     Patient Measurements: Height: 5\' 5"  (165.1 cm) Weight: 84.4 kg (186 lb) IBW/kg (Calculated) : 61.5 Heparin Dosing Weight: 79 kg  Vital Signs: Temp: 98.6 F (37 C) (11/12 0000) Temp Source: Oral (11/12 0000) BP: 115/42 (11/12 0100) Pulse Rate: 58 (11/12 0300)  Labs: Recent Labs    01/09/23 1710 01/09/23 1717 01/10/23 0256 01/10/23 1724 01/11/23 0255  HGB 10.4* 12.2* 10.3*  --  9.2*  HCT 33.6* 36.0* 35.4*  --  29.8*  PLT 255  --  227  --  238  APTT 39*  --   --  38* 46*  LABPROT 18.7*  --   --   --   --   INR 1.5*  --   --   --   --   HEPARINUNFRC  --   --   --  >1.10* >1.10*  CREATININE 1.29* 1.40* 1.28*  --  1.19    Estimated Creatinine Clearance: 39.6 mL/min (by C-G formula based on SCr of 1.19 mg/dL).   Medical History: Past Medical History:  Diagnosis Date   Abdominal pain    Acute renal failure (HCC)     resolved   Aortic stenosis    Arthritis    "hands & legs" (11/'10/2014)   Ataxia    Bladder outlet obstruction    BPH (benign prostatic hyperplasia)    CAD (coronary artery disease)    a. CABG IN 1989. b. 01/08/2015 CTO of ost LAD, LIMA to LAD not visualized but assumed patent given myoview finding, occluded SVG to diagonal, 99% mid RCA tx w/ SYNERGY DES 3X28 mm   Chronic heart failure with preserved ejection fraction (HFpEF) (HCC)    Constipation    Diabetes mellitus type 2, insulin dependent (HCC)    Epistaxis, recurrent 04/2015   GERD (gastroesophageal reflux disease)    HTN (hypertension)    Hx of bacterial pneumonia    Hyperlipemia    Kidney stones    Mild  cognitive impairment 03/05/2021   MMSE 26/30 and 6CIT 9 03/2021 AWV   Permanent atrial fibrillation (HCC)    Pneumonia 04/2019   Rib fractures    left rib fractures being treated with pain medications   Thoracic aortic aneurysm (HCC)    Urinary tract infection     Enterococcus    Medications:  - on Eliquis 5mg  bid PTA (last dose taken on 01/09/23 at 0800)  Assessment: Patient is a 89 y.o M with hx afib on Eliquis PTA who presented to the ED on 01/09/23 with c/o generalized weakness. Abd CT showed multifocal pneumonia and right hip periprosthetic fluid collection. Eliquis held on admission in case surgical intervention is needed by ortho. Pharmacy has been consulted on 01/10/23 to start heparin drip for patient for afib with RVR and while Eliquis is on hold.   Today, 01/11/2023: aPTT 46, subtherapeutic on 1100 units/hr HL > 1.1 as expected with eliquis on board Hgb 9.2, plts 238 Per RN no bleeding  Goal of Therapy:  Heparin level 0.3-0.7 units/ml aPTT 66-102 seconds Monitor platelets by anticoagulation protocol: Yes   Plan:  - increase  heparin drip to 1300 units/hr - check 8 hr aPTT - daily CBC - monitor for s/sx bleeding   Maurice March 01/11/2023,4:08 AM

## 2023-01-11 NOTE — Progress Notes (Signed)
PHARMACY - ANTICOAGULATION CONSULT NOTE  Pharmacy Consult for heparin  Indication: hx atrial fibrillation (PTA Eliquis on hold)  Allergies  Allergen Reactions   Septra [Sulfamethoxazole-Trimethoprim] Itching   Cefadroxil Other (See Comments)    Unknown reaction    Ciprofloxacin Other (See Comments)    Unknown action     Erythromycin Other (See Comments)    Upsets the stomach     Patient Measurements: Height: 5\' 5"  (165.1 cm) Weight: 84.4 kg (186 lb) IBW/kg (Calculated) : 61.5 Heparin Dosing Weight: 79 kg  Vital Signs: Temp: 99.3 F (37.4 C) (11/12 1200) Temp Source: Axillary (11/12 1200) BP: 123/40 (11/12 0500) Pulse Rate: 97 (11/12 0500)  Labs: Recent Labs    01/09/23 1710 01/09/23 1717 01/10/23 0256 01/10/23 1724 01/11/23 0255 01/11/23 1240  HGB 10.4* 12.2* 10.3*  --  9.2*  --   HCT 33.6* 36.0* 35.4*  --  29.8*  --   PLT 255  --  227  --  238  --   APTT 39*  --   --  38* 46* 48*  LABPROT 18.7*  --   --   --   --   --   INR 1.5*  --   --   --   --   --   HEPARINUNFRC  --   --   --  >1.10* >1.10*  --   CREATININE 1.29* 1.40* 1.28*  --  1.19  --     Estimated Creatinine Clearance: 39.6 mL/min (by C-G formula based on SCr of 1.19 mg/dL).   Medical History: Past Medical History:  Diagnosis Date   Abdominal pain    Acute renal failure (HCC)     resolved   Aortic stenosis    Arthritis    "hands & legs" (11/'10/2014)   Ataxia    Bladder outlet obstruction    BPH (benign prostatic hyperplasia)    CAD (coronary artery disease)    a. CABG IN 1989. b. 01/08/2015 CTO of ost LAD, LIMA to LAD not visualized but assumed patent given myoview finding, occluded SVG to diagonal, 99% mid RCA tx w/ SYNERGY DES 3X28 mm   Chronic heart failure with preserved ejection fraction (HFpEF) (HCC)    Constipation    Diabetes mellitus type 2, insulin dependent (HCC)    Epistaxis, recurrent 04/2015   GERD (gastroesophageal reflux disease)    HTN (hypertension)    Hx of  bacterial pneumonia    Hyperlipemia    Kidney stones    Mild cognitive impairment 03/05/2021   MMSE 26/30 and 6CIT 9 03/2021 AWV   Permanent atrial fibrillation (HCC)    Pneumonia 04/2019   Rib fractures    left rib fractures being treated with pain medications   Thoracic aortic aneurysm (HCC)    Urinary tract infection     Enterococcus    Medications:  -Eliquis 5 mg BID PTA (last dose on 01/09/23 at 0800)  Assessment: Patient is a 89 y.o M with hx afib on Eliquis PTA who presented to the ED on 01/09/23 with c/o generalized weakness. Abd CT showed multifocal pneumonia and right hip periprosthetic fluid collection. Eliquis held on admission in case surgical intervention is needed by ortho. Pharmacy has been consulted on 01/10/23 to start heparin drip for patient for afib with RVR and while Eliquis is on hold.   Today, 01/11/2023: -aPTT 48 - remains subtherapeutic on heparin infusion at 1300 units/hr -HL > 1.1 as expected with recent Eliquis -D/w RN, no issues or concerns  Goal of Therapy:  Heparin level 0.3-0.7 units/ml aPTT 66-102 seconds Monitor platelets by anticoagulation protocol: Yes   Plan:  -Bolus heparin 2000 units -Increase heparin infusion to 1550 units/hr -Check 8 hr aPTT -Daily CBC and heparin level -Follow up for plans for procedure or ability to transition back to oral anticoagulation   Pricilla Riffle, PharmD, BCPS Clinical Pharmacist 01/11/2023 1:40 PM

## 2023-01-11 NOTE — Progress Notes (Addendum)
       Overnight   NAME: [name] MRN: [id number] DOB : [birth year]    Date of Service   01/11/2023   HPI/Events of Note    Notified of patient continuing to attempt to get out of bed in spite of redirection, reorientation, and medication for rest.  Short duration waist belt utilized on a temporary trial basis.    Interventions/ Plan   Waist best for short duration, remove as soon as able or by 0600 Curtains open at day break Room lights on at Day break Fall arresting pads in place  Re-orient frequently        Chinita Greenland BSN MSNA MSN ACNPC-AG Acute Care Nurse Practitioner [facility]

## 2023-01-11 NOTE — Plan of Care (Signed)
  Problem: Nutrition: Goal: Adequate nutrition will be maintained Outcome: Progressing   Problem: Coping: Goal: Level of anxiety will decrease Outcome: Not Progressing   Problem: Pain Management: Goal: General experience of comfort will improve Outcome: Not Progressing   Problem: Safety: Goal: Ability to remain free from injury will improve Outcome: Not Progressing   Problem: Skin Integrity: Goal: Risk for impaired skin integrity will decrease Outcome: Not Progressing

## 2023-01-11 NOTE — TOC Progression Note (Addendum)
Transition of Care [facility]) - Progression Note    Patient Details  Name: [name] MRN: [id number] Date of Birth: January 14, 1929  Transition of Care [facility]) CM/SW Contact  Darleene Cleaver, [place] Phone Number: 01/11/2023, 4:35 PM  Clinical Narrative:      CSW received email, that patient's social worker at the [facility], Audrea Muscat, [phone] wanted to speak to CSW.  CSW attempted to contact her, and had to leave a message on voice mail.  Awaiting for a call back.  4:50pm attempted to contact patient's wife to discuss readmission risk.  Unable to leave a message on voice mail, CSW to try to follow up at a later time.  Readmission risk completed by chart review.      Expected Discharge Plan and Services                                               Social Determinants of Health (SDOH) Interventions SDOH Screenings   Food Insecurity: No Food Insecurity (01/09/2023)  Housing: Low Risk  (01/09/2023)  Transportation Needs: No Transportation Needs (01/09/2023)  Utilities: Not At Risk (01/09/2023)  Depression (PHQ2-9): Low Risk  (12/07/2022)  Financial Resource Strain: Low Risk  (03/11/2022)  Physical Activity: Inactive (03/11/2022)  Social Connections: Moderately Isolated (03/11/2022)  Stress: No Stress Concern Present (03/11/2022)  Tobacco Use: Medium Risk (01/09/2023)    Readmission Risk Interventions     No data to display

## 2023-01-11 NOTE — Progress Notes (Signed)
Pharmacy - Heparin >> Eliquis  Assessment:    Please see note from Laureen Ochs, PharmD earlier today for full details.  Briefly, 89 y.o. male on IV heparin for Afib (on Eliquis at home) while awaiting Ortho evaluation for possible surgery.  Seen by Ortho today; no plans for surgical intervention OK to transition back to Eliquis per Ortho/Cardiology  Plan:  Resume Eliquis 5 mg PO bid tonight Stop heparin with first dose of Eliquis Pharmacy will follow peripherally for further dose adjustments or clinical changes.  [name], PharmD, BCPS [phone] 01/11/2023, 4:30 PM

## 2023-01-11 NOTE — Plan of Care (Signed)
  Problem: Activity: Goal: Ability to tolerate increased activity will improve Outcome: Progressing   Problem: Respiratory: Goal: Ability to maintain adequate ventilation will improve Outcome: Progressing Goal: Ability to maintain a clear airway will improve Outcome: Progressing   Problem: Coping: Goal: Ability to adjust to condition or change in health will improve Outcome: Progressing   Problem: Health Behavior/Discharge Planning: Goal: Ability to manage health-related needs will improve Outcome: Progressing   Problem: Skin Integrity: Goal: Risk for impaired skin integrity will decrease Outcome: Progressing

## 2023-01-11 NOTE — Progress Notes (Addendum)
[facility]                                                                              Bostin Vitiello, is a 89 y.o. male, DOB - September 20, 1928, [id number] Admit date - 01/09/2023    Outpatient Primary MD for the patient is [name], MD  LOS - 2  days  Chief Complaint  Patient presents with   Weakness       Brief summary   Patient is a 89 year old male with HTN, IDDM, CAD status post CABG in 1989 A-fib on Eliquis, chronic diastolic CHF, presented with fatigue, generalized malaise.  Patient ported that 3 to 4 days ago, he started having loss of appetite, trouble sleeping and fatigue.  Denied any significant shortness of breath or coughing.  No nausea vomiting, abdominal pain.  He has chronic right hip pain for which he receives injections periodically, not worse than usual. In ED, temp 103 F, HR 134, BP 135/105, dropped to 88/48, O2 sats 96% on 2 L CBC showed WBCs 22.4, was 9.3 on 11/30/2022 sodium 129, BUN 24, creatinine 1.2, trended up to 1.4 Chest x-ray showed left mid and lower lung pneumonia, cardiomegaly and pulmonary vascular congestion CT abdomen pelvis showed multifocal pneumonia, large periprostatic fluid collection 8.8x 5.6 cm concerning for particle disease, cortical breakthrough in the posterior stable measuring 6.2x 4.3 cm MRI right hip showed prior right total hip arthroplasty with large prostatic fluid collection up to 14.0 cm concerning for chronic disease, mild right iliopsoas bursitis, mild to moderate left hip osteoarthritis  Assessment & Plan    Principal Problem:   Sepsis due to pneumonia (HCC), POA, acute respiratory failure with hypoxia -Patient met sepsis criteria on admission with fevers, tachycardia, tachypnea, hypotension, hypoxia, source likely due to multifocal pneumonia - RSV, flu, COVID-negative.  Leukocytosis mildly improving 21.7 (<23.9 on 11/11) -Blood cultures negative so far, urine strep antigen negative, follow urine  Legionella antigen  -Continue IV Zithromax, Rocephin  -Wean O2 as tolerated  Active problems Right hip periprosthetic fluid collection; acetabular lesion  -Incidentally seen on the CT, has chronic left hip pain -Orthopedics consulted, no convincing signs or symptoms to suggest septic/infectious etiology.  Recommended observation and monitoring.   -MRI showed large periprostatic fluid collection measuring up to 14.0 cm concerning).  Mild right iliopsoas bursitis, mild to moderate left hip osteoarthritis -Will await orthopedics evaluation today if needs to be aspirated given MRI reading  Permanent atrial fibrillation with RVR  - in RVR likely due to agitation overnight -Cardiology consulted, currently no plans for DCCV as A-fib is permanent -Continue metoprolol 50 mg twice daily, titrating up -Eliquis currently on hold.  TSH normal.  Acute metabolic encephalopathy, likely sundowning -Overnight was noted to be agitated, trying to get out of the bed, was placed in restraints -Received IV Haldol overnight - likely has sundowning, prior history of stroke per MRI in 2018, may have underlying dementia.  Will order CT head -Will continue gabapentin that he takes at bedtime, trazodone as needed for sleep, delirium precautions -May benefit from Seroquel low-dose, placed on 12.5 mg at bedtime   Hyponatremia  - Serum  sodium is 129 in setting of hyperglycemia and hypovolemia   -Sodium improving, 133    CAD status post CABG - No anginal symptoms  - Continue beta-blocker     Insulin-dependent DM  - A1c was 7.3% in August 2024  Continue Semglee 20 units daily, SSI, add NovoLog meal coverage 3 units 3 times daily AC CBG (last 3)  Recent Labs    01/10/23 2110 01/11/23 0731 01/11/23 1105  GLUCAP 181* 144* 157*     Chronic HFpEF, moderate to severe aortic stenosis - Appears compensated  -Outpatient on Lasix 40 mg daily, currently on hold due to sepsis and poor p.o. intake in the last few  days -2D echo 05/02/2022 had shown EF of 65%, left ventricular diastolic parameters indeterminate, severely dilated left atrial size, moderate to severe aortic stenosis    CKD 3A  - Appears close to baseline  - Renally-dose medications   Microcytic anemia -Chronic, baseline 10-11, currently at baseline, no bleeding  Obesity Estimated body mass index is 30.95 kg/m as calculated from the following:   Height as of this encounter: 5\' 5"  (1.651 m).   Weight as of this encounter: 84.4 kg.  Code Status: Full code DVT Prophylaxis:  SCDs Start: 01/09/23 2044   Level of Care: Level of care: Stepdown Family Communication: Updated patient's daughter, Ms Lucky Cowboy on the phone today Disposition Plan:      Remains inpatient appropriate:      Procedures:  None  Consultants:   Orthopedics Cardiology  Antimicrobials:   Anti-infectives (From admission, onward)    Start     Dose/Rate Route Frequency Ordered Stop   01/10/23 0400  cefTRIAXone (ROCEPHIN) 2 g in sodium chloride 0.9 % 100 mL IVPB        2 g 200 mL/hr over 30 Minutes Intravenous Every 24 hours 01/09/23 2039 01/15/23 0359   01/09/23 2200  azithromycin (ZITHROMAX) 500 mg in dextrose 5 % 250 mL IVPB        500 mg 250 mL/hr over 60 Minutes Intravenous Every 24 hours 01/09/23 2039 01/14/23 2159   01/09/23 1730  ceFEPIme (MAXIPIME) 2 g in sodium chloride 0.9 % 100 mL IVPB        2 g 200 mL/hr over 30 Minutes Intravenous  Once 01/09/23 1717 01/09/23 1753   01/09/23 1730  metroNIDAZOLE (FLAGYL) IVPB 500 mg        500 mg 100 mL/hr over 60 Minutes Intravenous  Once 01/09/23 1717 01/09/23 1923   01/09/23 1730  vancomycin (VANCOCIN) IVPB 1000 mg/200 mL premix  Status:  Discontinued        1,000 mg 200 mL/hr over 60 Minutes Intravenous  Once 01/09/23 1717 01/09/23 1725   01/09/23 1730  vancomycin (VANCOREADY) IVPB 1750 mg/350 mL        1,750 mg 175 mL/hr over 120 Minutes Intravenous  Once 01/09/23 1725 01/09/23 2123           Medications  Chlorhexidine Gluconate Cloth  6 each Topical Daily   dutasteride  0.5 mg Oral QODAY   gabapentin  300 mg Oral QHS   insulin aspart  0-5 Units Subcutaneous QHS   insulin aspart  0-9 Units Subcutaneous TID WC   insulin glargine-yfgn  20 Units Subcutaneous Daily   metoprolol tartrate  50 mg Oral Q8H   pantoprazole  40 mg Oral Q1200   sodium chloride flush  3 mL Intravenous Q12H   tamsulosin  0.4 mg Oral QODAY   traZODone  25 mg Oral  QHS      Subjective:   Subhash Upshur was seen and examined today.  Currently calm and cooperative, oriented. overnight issues with agitation, trying to get out of the bed and was placed on restraints.  Denies any chest pain, nausea, vomiting, abdominal pain.   Objective:   Vitals:   01/11/23 0300 01/11/23 0400 01/11/23 0500 01/11/23 0800  BP:  (!) 100/41 (!) 123/40   Pulse: (!) 58 99 97   Resp: (!) 27 (!) 27 (!) 26   Temp:  98.2 F (36.8 C)  (!) 97.4 F (36.3 C)  TempSrc:  Axillary  Oral  SpO2: 96% 96% 98%   Weight:      Height:        Intake/Output Summary (Last 24 hours) at 01/11/2023 1106 Last data filed at 01/11/2023 0515 Gross per 24 hour  Intake 1295.05 ml  Output 400 ml  Net 895.05 ml     Wt Readings from Last 3 Encounters:  01/09/23 84.4 kg  12/14/22 84.4 kg  12/07/22 90.5 kg    Physical Exam General: Alert and oriented x 3, NAD Cardiovascular: Irregular Respiratory: Diminished breath sounds at bases Gastrointestinal: Soft, nontender, nondistended, NBS Ext: no pedal edema bilaterally Neuro: no new deficits Psych: Normal affect   Data Reviewed:  I have personally reviewed following labs    CBC Lab Results  Component Value Date   WBC 21.7 (H) 01/11/2023   RBC 4.02 (L) 01/11/2023   HGB 9.2 (L) 01/11/2023   HCT 29.8 (L) 01/11/2023   MCV 74.1 (L) 01/11/2023   MCH 22.9 (L) 01/11/2023   PLT 238 01/11/2023   MCHC 30.9 01/11/2023   RDW 17.9 (H) 01/11/2023   LYMPHSABS 1.3 01/09/2023    MONOABS 1.9 (H) 01/09/2023   EOSABS 0.0 01/09/2023   BASOSABS 0.1 01/09/2023     Last metabolic panel Lab Results  Component Value Date   NA 133 (L) 01/11/2023   K 3.2 (L) 01/11/2023   CL 98 01/11/2023   CO2 21 (L) 01/11/2023   BUN 24 (H) 01/11/2023   CREATININE 1.19 01/11/2023   GLUCOSE 165 (H) 01/11/2023   GFRNONAA 57 (L) 01/11/2023   GFRAA 57 (L) 01/17/2020   CALCIUM 8.8 (L) 01/11/2023   PHOS 4.4 05/14/2022   PROT 7.1 01/09/2023   ALBUMIN 3.4 (L) 01/09/2023   BILITOT 1.5 (H) 01/09/2023   ALKPHOS 62 01/09/2023   AST 18 01/09/2023   ALT 14 01/09/2023   ANIONGAP 14 01/11/2023    CBG (last 3)  Recent Labs    01/10/23 2110 01/11/23 0731 01/11/23 1105  GLUCAP 181* 144* 157*      Coagulation Profile: Recent Labs  Lab 01/09/23 1710  INR 1.5*     Radiology Studies: I have personally reviewed the imaging studies  MR HIP RIGHT W WO CONTRAST  Result Date: 01/10/2023 CLINICAL DATA:  Right hip periprosthetic fluid collection. Denies significant hip pain. EXAM: MRI OF THE RIGHT HIP WITHOUT AND WITH CONTRAST TECHNIQUE: Multiplanar, multisequence MR imaging was performed both before and after administration of intravenous contrast. CONTRAST:  6mL GADAVIST GADOBUTROL 1 MMOL/ML IV SOLN COMPARISON:  CT abdomen pelvis from yesterday. CT abdomen pelvis dated April 21, 2022, and November 28, 2010. FINDINGS: Bones: Prior right total hip arthroplasty with associated susceptibility artifact. Expansile mixed signal intensity replacing normal marrow in the right greater trochanter and right anterior and posterior acetabulum, with associated cortical breakthrough. Large thick-walled T2 and T1 hyperintense fluid collection with hypointense rim surrounding the right hip arthroplasty,  measuring approximately 7.7 x 8.3 x 14.0 cm (AP by transverse by CC). There is no evidence of acute fracture, dislocation or avascular necrosis. The visualized sacroiliac joints and symphysis pubis appear  normal. Articular cartilage and labrum Articular cartilage: Areas of high-grade partial-thickness cartilage loss in the superior left hip joint with subchondral cystic change in the acetabulum. Labrum: Small paralabral cyst along the superior left labrum, suspicious for underlying labral tear. Joint or bursal effusion Joint effusion: No significant left hip joint effusion. Bursae: Small amount of fluid in the right iliopsoas bursa. Muscles and tendons Muscles and tendons: The visualized gluteus, hamstring and iliopsoas tendons appear intact. No muscle edema. Right-greater-than-left gluteus minimus and medius muscle atrophy. Asymmetric atrophy of the right piriformis muscle. Other findings Miscellaneous: Unchanged moderate prostatomegaly. IMPRESSION: 1. 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 2. Expansile mixed signal intensity replacing normal marrow in the right greater trochanter and right anterior and posterior acetabulum with associated cortical breakthrough, also consistent with particle disease. 3. Mild right iliopsoas bursitis. 4. Mild to moderate left hip osteoarthritis. Electronically Signed   By: Obie Dredge M.D.   On: 01/10/2023 12:01   DG Chest Port 1 View  Result Date: 01/09/2023 CLINICAL DATA:  Questionable sepsis, weakness and feeling sick, shortness of breath EXAM: PORTABLE CHEST 1 VIEW COMPARISON:  11/30/2022 FINDINGS: Hazy airspace and interstitial opacities in the left mid and lower lung compatible with pneumonia. No pneumothorax. Cardiomegaly and pulmonary vascular congestion. Sternotomy and CABG. Aortic atherosclerotic calcification. IMPRESSION: 1. Left mid and lower lung pneumonia. Follow-up in 6-8 weeks after treatment is recommended to ensure resolution. 2. Cardiomegaly and pulmonary vascular congestion. Electronically Signed   By: Minerva Fester M.D.   On: 01/09/2023 19:31   CT ABDOMEN  PELVIS W CONTRAST  Result Date: 01/09/2023 CLINICAL DATA:  Right lower quadrant abdominal pain. Weakness and feeling sick. EXAM: CT ABDOMEN AND PELVIS WITH CONTRAST TECHNIQUE: Multidetector CT imaging of the abdomen and pelvis was performed using the standard protocol following bolus administration of intravenous contrast. RADIATION DOSE REDUCTION: This exam was performed according to the departmental dose-optimization program which includes automated exposure control, adjustment of the mA and/or kV according to patient size and/or use of iterative reconstruction technique. CONTRAST:  OMNIPAQUE IOHEXOL 300 MG/ML  SOLN COMPARISON:  CT abdomen and pelvis 04/21/2022 FINDINGS: Lower chest: Partially visualized consolidation in the left lower lobe. Small left pleural effusion. Right basilar atelectasis or infiltrates. Hepatobiliary: Cholelithiasis without evidence of acute cholecystitis. Unremarkable liver and biliary tree. Pancreas: Unremarkable. Spleen: Unremarkable. Adrenals/Urinary Tract: Normal adrenal glands. 2 mm nonobstructing right nephrolithiasis. No obstructing ureteral calculi or hydronephrosis. Unremarkable bladder. Stomach/Bowel: Normal caliber large and small bowel. No bowel wall thickening. Appendix is normal. Small hiatal hernia. Vascular/Lymphatic: Advanced aortic atherosclerotic calcification. No lymphadenopathy. Reproductive: Prostatomegaly. Other: No free intraperitoneal fluid or air. Musculoskeletal: Right THA. Expansile lesion with cortical breakthrough in the posterior acetabulum measuring 6.2 x 4.3 cm. Large periprosthetic fluid collection is partially obscured by streak artifact from the right THA and extends below the inferior aspect of the exam. This measures approximately 8.8 x 5.6 cm in the axial plane. IMPRESSION: 1. Pulmonary findings compatible with multifocal pneumonia. 2. Large periprosthetic fluid collection is partially obscured by streak artifact from the right THA and  extends below the inferior aspect of the exam. This measures approximately 8.8 x 5.6 cm in the axial plane. 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 If there is  concern for infection, fluid sampling is recommended. 3. Unchanged expansile lesion with cortical breakthrough in the posterior acetabulum measuring 6.2 x 4.3 cm. Consider MRI for further evaluation 4. Cholelithiasis without evidence of acute cholecystitis. 5. 2 mm nonobstructing right nephrolithiasis. 6. Enlarged prostate. Aortic Atherosclerosis (ICD10-I70.0). Electronically Signed   By: Minerva Fester M.D.   On: 01/09/2023 19:29       Janielle Mittelstadt M.D. [facility] 01/11/2023, 11:06 AM  Available via Epic secure chat 7am-7pm After 7 pm, please refer to night coverage provider listed on amion.
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
## 2023-01-12 ENCOUNTER — Inpatient Hospital Stay (HOSPITAL_COMMUNITY): Payer: Medicare Other

## 2023-01-12 DIAGNOSIS — I482 Chronic atrial fibrillation, unspecified: Secondary | ICD-10-CM | POA: Diagnosis not present

## 2023-01-12 DIAGNOSIS — I4891 Unspecified atrial fibrillation: Secondary | ICD-10-CM | POA: Diagnosis not present

## 2023-01-12 DIAGNOSIS — A419 Sepsis, unspecified organism: Secondary | ICD-10-CM | POA: Diagnosis not present

## 2023-01-12 DIAGNOSIS — I2510 Atherosclerotic heart disease of native coronary artery without angina pectoris: Secondary | ICD-10-CM | POA: Diagnosis not present

## 2023-01-12 DIAGNOSIS — J189 Pneumonia, unspecified organism: Secondary | ICD-10-CM | POA: Diagnosis not present

## 2023-01-12 LAB — LEGIONELLA PNEUMOPHILA SEROGP 1 UR AG: L. pneumophila Serogp 1 Ur Ag: NEGATIVE

## 2023-01-12 LAB — BASIC METABOLIC PANEL
Anion gap: 10 (ref 5–15)
BUN: 23 mg/dL (ref 8–23)
CO2: 22 mmol/L (ref 22–32)
Calcium: 8.7 mg/dL — ABNORMAL LOW (ref 8.9–10.3)
Chloride: 102 mmol/L (ref 98–111)
Creatinine, Ser: 0.97 mg/dL (ref 0.61–1.24)
GFR, Estimated: >60 mL/min (ref >60)
Glucose, Bld: 184 mg/dL — ABNORMAL HIGH (ref 70–99)
Potassium: 3.5 mmol/L (ref 3.5–5.1)
Sodium: 134 mmol/L — ABNORMAL LOW (ref 135–145)

## 2023-01-12 LAB — CBC WITH DIFFERENTIAL/PLATELET
Abs Immature Granulocytes: 0.4 10*3/uL — ABNORMAL HIGH (ref 0.00–0.07)
Basophils Absolute: 0.2 10*3/uL — ABNORMAL HIGH (ref 0.0–0.1)
Basophils Relative: 1 %
Eosinophils Absolute: 0.4 10*3/uL (ref 0.0–0.5)
Eosinophils Relative: 2 %
HCT: 34.2 % — ABNORMAL LOW (ref 39.0–52.0)
Hemoglobin: 9.9 g/dL — ABNORMAL LOW (ref 13.0–17.0)
Immature Granulocytes: 2 %
Lymphocytes Relative: 5 %
Lymphs Abs: 1 10*3/uL (ref 0.7–4.0)
MCH: 22.2 pg — ABNORMAL LOW (ref 26.0–34.0)
MCHC: 28.9 g/dL — ABNORMAL LOW (ref 30.0–36.0)
MCV: 76.9 fL — ABNORMAL LOW (ref 80.0–100.0)
Monocytes Absolute: 1.7 10*3/uL — ABNORMAL HIGH (ref 0.1–1.0)
Monocytes Relative: 9 %
Neutro Abs: 15.7 10*3/uL — ABNORMAL HIGH (ref 1.7–7.7)
Neutrophils Relative %: 81 %
Platelets: 227 10*3/uL (ref 150–400)
RBC: 4.45 MIL/uL (ref 4.22–5.81)
RDW: 18.7 % — ABNORMAL HIGH (ref 11.5–15.5)
WBC: 19.3 10*3/uL — ABNORMAL HIGH (ref 4.0–10.5)
nRBC: 0 % (ref 0.0–0.2)

## 2023-01-12 LAB — GLUCOSE, CAPILLARY
Glucose-Capillary: 143 mg/dL — ABNORMAL HIGH (ref 70–99)
Glucose-Capillary: 188 mg/dL — ABNORMAL HIGH (ref 70–99)
Glucose-Capillary: 225 mg/dL — ABNORMAL HIGH (ref 70–99)
Glucose-Capillary: 258 mg/dL — ABNORMAL HIGH (ref 70–99)

## 2023-01-12 LAB — CBC
HCT: 30.4 % — ABNORMAL LOW (ref 39.0–52.0)
Hemoglobin: 9.2 g/dL — ABNORMAL LOW (ref 13.0–17.0)
MCH: 22.4 pg — ABNORMAL LOW (ref 26.0–34.0)
MCHC: 30.3 g/dL (ref 30.0–36.0)
MCV: 74 fL — ABNORMAL LOW (ref 80.0–100.0)
Platelets: 246 10*3/uL (ref 150–400)
RBC: 4.11 MIL/uL — ABNORMAL LOW (ref 4.22–5.81)
RDW: 18.4 % — ABNORMAL HIGH (ref 11.5–15.5)
WBC: 20.1 10*3/uL — ABNORMAL HIGH (ref 4.0–10.5)
nRBC: 0 % (ref 0.0–0.2)

## 2023-01-12 MED ORDER — FUROSEMIDE 10 MG/ML IJ SOLN
20.0000 mg | Freq: Every day | INTRAMUSCULAR | Status: DC
  Administered 2023-01-12 – 2023-01-15 (×4): 20 mg via INTRAVENOUS
  Filled 2023-01-12 (×4): qty 2

## 2023-01-12 MED ORDER — AZITHROMYCIN 250 MG PO TABS
500.0000 mg | ORAL_TABLET | Freq: Every day | ORAL | Status: AC
  Administered 2023-01-12 – 2023-01-13 (×2): 500 mg via ORAL
  Filled 2023-01-12 (×2): qty 2

## 2023-01-12 MED ORDER — METOPROLOL TARTRATE 5 MG/5ML IV SOLN
2.5000 mg | Freq: Four times a day (QID) | INTRAVENOUS | Status: DC | PRN
  Administered 2023-01-12: 2.5 mg via INTRAVENOUS
  Filled 2023-01-12: qty 5

## 2023-01-12 MED ORDER — DILTIAZEM HCL 30 MG PO TABS: 30.0000 mg | ORAL_TABLET | Freq: Four times a day (QID) | ORAL | Status: DC

## 2023-01-12 MED ORDER — DILTIAZEM LOAD VIA INFUSION
10.0000 mg | Freq: Once | INTRAVENOUS | Status: AC
  Administered 2023-01-12: 10 mg via INTRAVENOUS
  Filled 2023-01-12: qty 10

## 2023-01-12 MED ORDER — DILTIAZEM HCL-DEXTROSE 125-5 MG/125ML-% IV SOLN (PREMIX)
5.0000 mg/h | INTRAVENOUS | Status: DC
  Administered 2023-01-12: 5 mg/h via INTRAVENOUS
  Filled 2023-01-12: qty 125

## 2023-01-12 MED ORDER — POLYSACCHARIDE IRON COMPLEX 150 MG PO CAPS
150.0000 mg | ORAL_CAPSULE | Freq: Every day | ORAL | Status: DC
  Administered 2023-01-12 – 2023-01-15 (×4): 150 mg via ORAL
  Filled 2023-01-12 (×4): qty 1

## 2023-01-12 MED ORDER — METHOCARBAMOL 500 MG PO TABS
500.0000 mg | ORAL_TABLET | Freq: Three times a day (TID) | ORAL | Status: DC | PRN
  Administered 2023-01-12: 500 mg via ORAL
  Filled 2023-01-12: qty 1

## 2023-01-12 MED ORDER — QUETIAPINE 12.5 MG HALF TABLET
12.5000 mg | ORAL_TABLET | Freq: Every evening | ORAL | Status: DC
  Administered 2023-01-12: 12.5 mg via ORAL
  Filled 2023-01-12: qty 1

## 2023-01-12 MED ORDER — ATORVASTATIN CALCIUM 40 MG PO TABS
40.0000 mg | ORAL_TABLET | Freq: Every day | ORAL | Status: DC
  Administered 2023-01-12 – 2023-01-14 (×3): 40 mg via ORAL
  Filled 2023-01-12 (×3): qty 1

## 2023-01-12 MED ORDER — POTASSIUM CHLORIDE CRYS ER 20 MEQ PO TBCR
40.0000 meq | EXTENDED_RELEASE_TABLET | Freq: Once | ORAL | Status: AC
  Administered 2023-01-12: 40 meq via ORAL
  Filled 2023-01-12: qty 2

## 2023-01-12 MED ORDER — AMOXICILLIN-POT CLAVULANATE 875-125 MG PO TABS
1.0000 | ORAL_TABLET | Freq: Two times a day (BID) | ORAL | Status: AC
  Administered 2023-01-12 – 2023-01-13 (×4): 1 via ORAL
  Filled 2023-01-12 (×4): qty 1

## 2023-01-12 MED ORDER — TRAMADOL HCL 50 MG PO TABS
50.0000 mg | ORAL_TABLET | Freq: Two times a day (BID) | ORAL | Status: DC | PRN
  Filled 2023-01-12: qty 1

## 2023-01-12 NOTE — TOC Progression Note (Signed)
Transition of Care [facility]) - Progression Note    Patient Details  Name: [name] MRN: [id number] Date of Birth: Mar 30, 1928  Transition of Care [facility]) CM/SW Contact  Darleene Cleaver, [place] Phone Number: 01/12/2023, 9:40 AM  Clinical Narrative:     CSW received phone call from [place] social worker at the [facility], [phone] covering for Audrea Muscat, [phone] social worker at the [place].  Per Cassie, patient's family had contacted them to find out what in home services he is eligible for.  Per Cassie, patient does not have any SNF benefits, but he is eligible for in home care.  CSW informed Cassie that patient may be going to a SNF, awaiting PT consult, and if he does go home with home health, we will use his Medicare benefits.  If family would like to see what in home care they are eligible for, they will have to contact [facility] to follow up.  TOC to continue to follow patient's progress throughout discharge planning.       Expected Discharge Plan and Services                                               Social Determinants of Health (SDOH) Interventions SDOH Screenings   Food Insecurity: No Food Insecurity (01/09/2023)  Housing: Low Risk  (01/09/2023)  Transportation Needs: No Transportation Needs (01/09/2023)  Utilities: Not At Risk (01/09/2023)  Depression (PHQ2-9): Low Risk  (12/07/2022)  Financial Resource Strain: Low Risk  (03/11/2022)  Physical Activity: Inactive (03/11/2022)  Social Connections: Moderately Isolated (03/11/2022)  Stress: No Stress Concern Present (03/11/2022)  Tobacco Use: Medium Risk (01/09/2023)    Readmission Risk Interventions    01/11/2023    5:06 PM  Readmission Risk Prevention Plan  Transportation Screening Not Complete  Transportation Screening Comment Unable to speak to wife or patient.  PCP or Specialist Appt within 3-5 Days Complete  HRI or Home Care Consult Complete  Social Work Consult for Recovery Care  Planning/Counseling Complete  Palliative Care Screening Complete  Medication Review Oceanographer) Referral to Pharmacy

## 2023-01-12 NOTE — Hospital Course (Addendum)
Brief hospital course: PMH of HTN, IDDM, CAD status post CABG in 1989 A-fib on Eliquis, chronic diastolic CHF, presented with fatigue, generalized malaise.  Currently being treated for sepsis due to pneumonia as well as A-fib with RVR. Incidentally was also found to have right periprosthetic fluid collection, which appears chronic, currently being managed conservatively.  Assessment and Plan: Sepsis due to community-acquired pneumonia. Present on admission. Acute hypoxic respiratory failure secondary to pneumonia. Met SIRS criteria on admission with fever tachycardia tachypnea. COVID-negative.  RSV and flu negative. Blood cultures so far negative. Urine strep and Legionella antigen negative as well. Leukocytosis improving. Was on IV ceftriaxone and Zithromax. Chest x-ray ordered by cardiology on 11/13 shows no significant change per my evaluation. No hypoxia for now. Continue oral antibiotics Augmentin and azithromycin.  Permanent A-fib with RVR. Intermittent RVR. Currently on Lopressor. Was on Cardizem in the past for rate control which was stopped due to bradycardic episodes. Given his persistent RVR due to infection initiated on IV Cardizem. On anticoagulation with Eliquis. Highly appreciate cardiology consultation. Now back on Cardizem 120 mg.  Right periprosthetic fluid collection. Acetabular lesion. Orthopedic consulted. 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 Outpatient follow-up recommended. No intervention.  Acute metabolic encephalopathy. Potentially sundowning. Continue to monitor for now.  Hyponatremia. Resolved.  CAD SP CABG. No angina for now. Monitor.  Type 2 diabetes mellitus, on long-term insulin with nephropathy Hemoglobin A1c 7.3. Now with steroid-induced hyperglycemia. Blood sugar elevated. Will modify regimen.  Continue carb modified diet.  Chronic HFpEF. Volume level adequate for now.  On Lasix. Monitor.  Moderate to severe  arctic stenosis. Monitor for now.  CKD 3A. Renal function at baseline. Monitor for now.  Macrocytic anemia. Chronic in nature No active bleeding. Monitor.  Obesity Class 1 Body mass index is 33.09 kg/m.  Placing the pt at higher risk of poor outcomes.

## 2023-01-12 NOTE — Progress Notes (Addendum)
Progress Note  Patient Name: [name] Date of Encounter: 01/12/2023  Primary Cardiologist: Peter Swazil[place], MD  Subjective   Denies any chest pain. Gets a little SOB with mobility. HR upper 90s-low 100s presently. WBC remains up. Overall though the reports feeling much better since admission.  Inpatient Medications    Scheduled Meds:  amoxicillin-clavulanate  1 tablet Oral Q12H   apixaban  5 mg Oral BID   atorvastatin  40 mg Oral QHS   azithromycin  500 mg Oral Daily   Chlorhexidine Gluconate Cloth  6 each Topical Daily   dutasteride  0.5 mg Oral QODAY   furosemide  20 mg Intravenous Daily   gabapentin  300 mg Oral QHS   insulin aspart  0-5 Units Subcutaneous QHS   insulin aspart  0-9 Units Subcutaneous TID WC   insulin aspart  3 Units Subcutaneous TID WC   insulin glargine-yfgn  20 Units Subcutaneous Daily   iron polysaccharides  150 mg Oral Daily   metoprolol tartrate  50 mg Oral Q8H   pantoprazole  40 mg Oral Q1200   QUEtiapine  12.5 mg Oral QPM   sodium chloride flush  3 mL Intravenous Q12H   tamsulosin  0.4 mg Oral QODAY   Continuous Infusions:  diltiazem (CARDIZEM) infusion 5 mg/hr (01/12/23 1739)   PRN Meds: acetaminophen **OR** acetaminophen, guaiFENesin-dextromethorphan, levalbuterol, methocarbamol, ondansetron **OR** ondansetron (ZOFRAN) IV, senna-docusate, traMADol, traZODone   Vital Signs    Vitals:   01/12/23 1500 01/12/23 1506 01/12/23 1600 01/12/23 1700  BP:  135/69 (!) 114/46 129/64  Pulse:   (!) 144   Resp: (!) 29 20 (!) 34 (!) 27  Temp:  98.2 F (36.8 C)    TempSrc:  Oral    SpO2:   95%   Weight:      Height:        Intake/Output Summary (Last 24 hours) at 01/12/2023 1802 Last data filed at 01/12/2023 1600 Gross per 24 hour  Intake 412.38 ml  Output 2100 ml  Net -1687.62 ml      01/12/2023    5:00 AM 01/09/2023   10:00 PM 01/09/2023    5:20 PM  Last 3 Weights  Weight (lbs) 198 lb 13.7 oz 186 lb 186 lb  Weight (kg) 90.2 kg  84.369 kg 84.369 kg     Telemetry    Atrial fib occ PVCs, rare couplet/triplet - Personally Reviewed  Physical Exam    GEN: No acute distress.  HEENT: Normocephalic, atraumatic, sclera non-icteric. Neck: No JVD or bruits. Cardiac: irregularly irregular, tachycardic, +2/6 SEM RUSB, no rubs or gallops.  Respiratory: Decreased BS at bases, no overt wheezing, rales or rhonchi GI: Soft, nontender, non-distended, BS +x 4. MS: no deformity. Extremities: No clubbing or cyanosis. No edema. Distal pedal pulses are 2+ and equal bilaterally. Neuro:  AAOx3. Follows commands. Psych:  Responds to questions appropriately with a normal affect.  Labs    High Sensitivity Troponin:  No results for input(s): "TROPONINIHS" in the last 720 hours.    Cardiac EnzymesNo results for input(s): "TROPONINI" in the last 168 hours. No results for input(s): "TROPIPOC" in the last 168 hours.   Chemistry Recent Labs  Lab 01/09/23 1710 01/09/23 1717 01/10/23 0256 01/11/23 0255 01/12/23 0254  NA 129*   < > 131* 133* 134*  K 3.5   < > 3.3* 3.2* 3.5  CL 94*   < > 96* 98 102  CO2 23  --  22 21* 22  GLUCOSE 218*   < >  180* 165* 184*  BUN 24*   < > 24* 24* 23  CREATININE 1.29*   < > 1.28* 1.19 0.97  CALCIUM 8.9  --  8.8* 8.8* 8.7*  PROT 7.1  --   --   --   --   ALBUMIN 3.4*  --   --   --   --   AST 18  --   --   --   --   ALT 14  --   --   --   --   ALKPHOS 62  --   --   --   --   BILITOT 1.5*  --   --   --   --   GFRNONAA 52*  --  53* 57* >60  ANIONGAP 12  --  13 14 10    < > = values in this interval not displayed.     Hematology Recent Labs  Lab 01/11/23 0255 01/12/23 0254 01/12/23 1045  WBC 21.7* 20.1* 19.3*  RBC 4.02* 4.11* 4.45  HGB 9.2* 9.2* 9.9*  HCT 29.8* 30.4* 34.2*  MCV 74.1* 74.0* 76.9*  MCH 22.9* 22.4* 22.2*  MCHC 30.9 30.3 28.9*  RDW 17.9* 18.4* 18.7*  PLT 238 246 227    BNP Recent Labs  Lab 01/10/23 1244  BNP 472.9*     DDimer No results for input(s): "DDIMER" in the  last 168 hours.   Radiology    DG CHEST PORT 1 VIEW  Result Date: 01/12/2023 CLINICAL DATA:  Leukocytosis. EXAM: PORTABLE CHEST 1 VIEW COMPARISON:  January 09, 2023. FINDINGS: Stable cardiomegaly. Increased left lingular and basilar opacity is noted concerning for pneumonia or atelectasis with possible associated effusion. Minimal right basilar subsegmental atelectasis is noted. Sternotomy wires are noted. IMPRESSION: Increased left basilar opacity as noted above. Minimal right basilar subsegmental atelectasis. Aortic Atherosclerosis (ICD10-I70.0). Electronically Signed   By: Lupita Raider M.D.   On: 01/12/2023 13:23   CT HEAD WO CONTRAST ( )  Result Date: 01/11/2023 CLINICAL DATA:  Provided history: Mental status change, unknown cause. EXAM: CT HEAD WITHOUT CONTRAST TECHNIQUE: Contiguous axial images were obtained from the base of the skull through the vertex without intravenous contrast. RADIATION DOSE REDUCTION: This exam was performed according to the departmental dose-optimization program which includes automated exposure control, adjustment of the mA and/or kV according to patient size and/or use of iterative reconstruction technique. COMPARISON:  Head CT 10/04/2021.  Brain MRI 06/06/2016. FINDINGS: Brain: Generalized cerebral atrophy. Patchy and ill-defined hypoattenuation within the cerebral white matter, nonspecific but compatible with mild chronic small vessel ischemic disease. There is no acute intracranial hemorrhage. No demarcated cortical infarct. No extra-axial fluid collection. No evidence of an intracranial mass. No midline shift. Vascular: No hyperdense vessel.  Atherosclerotic calcifications. Skull: No calvarial fracture or aggressive osseous lesion. Sinuses/Orbits: No mass or acute finding within the imaged orbits. Trace mucosal thickening within the bilateral maxillary sinuses at the imaged levels. IMPRESSION: 1. No evidence of an acute intracranial abnormality. 2. Parenchymal  atrophy and chronic small vessel ischemic disease. Electronically Signed   By: Jackey Loge D.O.   On: 01/11/2023 15:58    Cardiac Studies    2d echo 04/2022   1. Left ventricular ejection fraction, by estimation, is 60 to 65%. The  left ventricle has normal function. The left ventricle has no regional  wall motion abnormalities. There is mild left ventricular hypertrophy.  Left ventricular diastolic parameters  are indeterminate.   2. Right ventricular systolic function was not well  visualized. The right  ventricular size is not well visualized. RV is poorly visualized but  function appears moderately reduced   3. The mitral valve is normal in structure. No evidence of mitral valve  regurgitation. No evidence of mitral stenosis.   4. Left atrial size was severely dilated.   5. Aortic dilatation noted. There is dilatation of the ascending aorta,  measuring 40 mm.   6. The aortic valve is tricuspid. There is severe calcification of the  aortic valve. Aortic valve regurgitation is mild. Moderate to severe  aortic valve stenosis. Moderate AS by gradients (MG 24 mmHg, Vmax 3.0  m/s), severe by AVA (0.7cm^2) and DI  (0.24). Low SV index (28 cc/m^2), suspect paradoxical low flow low  gradient severe AS   Patient Profile     89 y.o. male with CAD s/p CABG ~1990 and DES to RCA at last cath 2016, moderate-severe aortic stenosis,  chronic HFpEF, permanent atrial fibrillation, prior bradycardia requiring dose adjustment, IDDM, HTN, CKD 3a by labs (baseline Cr 1.2-1.3), GIB with ABL anemia 04/2022, ascending aortic aneurysm, pleural effusion s/p thoracentesis 2019, mild cognitive impairment, arthritis, BPH, CVA, mild carotid artery disease 2019, hiatal hernia admitted with sepsis due to PNA/hypoxic respiratory failure, hyponatremia and right hip periprosthetic fluid collection, acetabular lesion. Cardiology managing AF RVR. Hospital course complicated by suspected sundowning/delirium.  Assessment &  Plan    1. Suspected sepsis due to pneumonia with acute hypoxic respiratory failure - also with additional issues of hyponatremia and right hip periprosthetic fluid collection; acetabular lesion  - ortho recommends conservative rx - WBC remains persistently elevated (trending down), blood cx neg to date - per primary team   2. Permanent atrial fibrillation here with RVR, occasional PVCs - since AF is known to be permanent, suspect elevated rates due to acute illness, no plans for DCCV given that afib is permanent/longstanding - metoprolol titrated to 50mg  TID on 11/13 due to suboptimal rate control - HR remains high 90s/low 100s but in context of continued leukocytosis, follow this AM - Eliquis was transiently held in setting of possible ortho intervention - now back on Eliquis - TSH wnl - K 3.5-> will supp KCL x1, last Mg wnl   3. Chronic HFpEF - has appeared euvolemic this admission but off diuretics for several days now - given continued leukocytosis and elevated HR, will order portable CXR to guide med rx, weight increasing - consider resuming diuretic   4. Moderate-severe aortic stenosis by last echo 04/2022 - continue to follow as outpatient   5. CAD s/p CABG 1990, DES to RCA 2016 - no recent anginal symptoms, not on ASA due to concomitant Eliquis   6. Anemia - slight decline in Hgb from 10 range to 9.2, no bleeding reported   7. CKD 3a  - Cr improved from baseline, suspect falsely better off usual Lasix  Remainder of medical issues per primary team  Addendum: CXR showing increasing L basilar opacity, have relayed result to IM. Dr. Odis Hollingshead will review for resumption of low dose diuretic.  For questions or updates, please contact [facility] Please consult [email or web address] for contact info under Cardiology/STEMI.  Signed, Laurann Montana, [name] 01/12/2023, 8:02 AM    ADDENDUM:   Patient seen and examined with APP, Ronie Spies, PA.  I personally taken a history,  examined the patient, reviewed relevant notes,  laboratory data / imaging studies.  I performed a substantive portion of this encounter and formulated the important aspects of the plan.  I agree with the APP's note, impression, and recommendations; however, I have edited the note to reflect changes or salient points (which are noted in italics).   Patient seen and examined at bedside. Accompanied by his wife and grandson. Denies anginal chest pain. Lucila Maine has noted that he is more short of breath with prolonged conversation. Ventricular rate not well-controlled -about to be started on Cardizem drip  PHYSICAL EXAM:    01/12/2023    5:00 PM 01/12/2023    4:00 PM 01/12/2023    3:06 PM  Vitals with BMI  Systolic 129 114 259  Diastolic 64 46 69  Pulse  144     Net IO Since Admission: -1,478.79 mL [01/12/23 1802]  Filed Weights   01/09/23 1720 01/09/23 2200 01/12/23 0500  Weight: 84.4 kg 84.4 kg 90.2 kg    Physical Exam  Constitutional:  Age appropriate, hemodynamically stable, no acute distress.   Neck: No JVD present.  Cardiovascular: S1 normal and S2 normal. An irregularly irregular rhythm present. Tachycardia present. Exam reveals no gallop and no friction rub.  Murmur heard. Systolic murmur is present with a grade of 3/6 at the upper right sternal border. Pulmonary/Chest: Breath sounds normal. He has no wheezes. He has no rales. He exhibits no tenderness.  Abdominal: Soft. Bowel sounds are normal. He exhibits no distension. There is no abdominal tenderness.  Musculoskeletal:        General: No tenderness or edema.  Neurological: He is alert and oriented to person, place, and time.  Skin: Skin is warm and dry.   EKG: (personally reviewed by me) No new tracings  Telemetry: (personally reviewed by me) A-fib with RVR   Assessment/Plan: Sepsis due to multifocal pneumonia: Currently on antibiotics.  Management primary team  A-fib with RVR: Known history of permanent  A-fib Exacerbated by sepsis and multifocal pneumonia Increased Lopressor to 50 mg p.o. 3 times daily with very minimal improvement in ventricular rate. Given the fact that he remains in A-fib with RVR will initiate Cardizem drip. Rhythm control: N/A. Thromboembolic prophylaxis: Eliquis Telemetry reviewed. He does not endorse evidence of bleeding  Chronic HFpEF: Clinically appears to be euvolemic and no JVP on physical examination.  However grandson notes that patient is more short of breath with prolonged conversation.  Chest x-ray also concerning for either progression of pneumonia/vascular congestion.  Will start Lasix 20 mg IV push daily.  Strict I's and O's and daily weights  Moderate to severe aortic stenosis: Paradoxical low-flow low gradient AS Monitor for now.  Coronary artery disease without angina pectoris. History of CABG in 1998. History of DES to RCA in 2016. Denies angina pectoris. Reemphasized the importance of secondary prevention with focus on improving her modifiable cardiovascular risk factors such as glycemic control, lipid management, blood pressure control, weight loss.  Plan of care discussed with nursing staff, wife, patient at bedside.  Questions and concerns addressed to her satisfaction.  Will follow the patient with you.   This note was created using a voice recognition software as a result there may be grammatical errors inadvertently enclosed that do not reflect the nature of this encounter. Every attempt is made to correct such errors.   Tessa Lerner, DO, [facility]  [facility]  [address] [place], [place] [address] Pager: [phone] Office: [phone] 01/12/2023 6:02 PM

## 2023-01-12 NOTE — Progress Notes (Signed)
[facility] Progress Note Patient: [name] PPI:[id number] DOB: [birth year] DOA: 01/09/2023  DOS: the patient was seen and examined on 01/12/2023  Brief hospital course: PMH of HTN, IDDM, CAD status post CABG in 1989 A-fib on Eliquis, chronic diastolic CHF, presented with fatigue, generalized malaise.  Currently being treated for sepsis due to pneumonia as well as A-fib with RVR. Incidentally was also found to have right periprosthetic fluid collection, which appears chronic, currently being managed conservatively.  Assessment and Plan: Sepsis due to community-acquired pneumonia. Present on admission. Acute hypoxic respiratory failure secondary to pneumonia. Met SIRS criteria on admission with fever tachycardia tachypnea. COVID-negative.  RSV and flu negative. Blood cultures so far negative. Urine strep and Legionella antigen negative as well. Leukocytosis improving. Was on IV ceftriaxone and Zithromax. Chest x-ray ordered by cardiology on 11/13 shows no significant change per my evaluation. No hypoxia for now. Will switch to oral antibiotics Augmentin and azithromycin.  Permanent A-fib with RVR. Intermittent RVR. Currently on Lopressor. Was on Cardizem in the past for rate control which was stopped due to bradycardic episodes. Given his persistent RVR due to infection initiated on IV Cardizem. On anticoagulation with Eliquis. Highly appreciate cardiology consultation.  Right periprosthetic fluid collection. Acetabular lesion. Orthopedic consulted. 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 Outpatient follow-up recommended. No intervention.  Acute metabolic encephalopathy. Potentially sundowning. Continue to monitor for now.  Hyponatremia. Resolved.  CAD SP CABG. No angina for now. Monitor.  Type 2 diabetes mellitus, on long-term insulin with nephropathy Hemoglobin A1c 7.3. Continue current regimen. Change diet to carb modified  diet.  Chronic HFpEF. Volume level adequate for now. Monitor.  Moderate to severe arctic stenosis. Monitor for now.  CKD 3A. Renal function at baseline. Monitor for now.  Macrocytic anemia. Chronic in nature for No active bleeding. Monitor.  Obesity Class 1 Body mass index is 33.09 kg/m.  Placing the pt at higher risk of poor outcomes.    Subjective: No nausea no vomiting no fever no chills.  No chest pain.  Primary complaint is about food quality!  Remains tachycardic with worsening RVR as the day progressed.  Physical Exam: General: in Mild distress, No Rash Cardiovascular: S1 and S2 Present, No Murmur Respiratory: Good respiratory effort, Bilateral Air entry present.  Basal crackles, No wheezes Abdomen: Bowel Sound present, No tenderness Extremities: No edema Neuro: Alert and oriented x3, no new focal deficit  Data Reviewed: I have Reviewed nursing notes, Vitals, and Lab results. Since last encounter, pertinent lab results CBC and BMP   . I have ordered test including CBC and BMP  . I have discussed pt's care plan and test results with cardiology  .   Disposition: Status is: Inpatient Remains inpatient appropriate because: Better rate control  apixaban (ELIQUIS) tablet 5 mg   Family Communication: Discussed with family at bedside Level of care: Stepdown   Vitals:   01/12/23 1500 01/12/23 1506 01/12/23 1600 01/12/23 1700  BP:  135/69 (!) 114/46 129/64  Pulse:   (!) 144   Resp: (!) 29 20 (!) 34 (!) 27  Temp:  98.2 F (36.8 C)    TempSrc:  Oral    SpO2:   95%   Weight:      Height:         Author: Lynden Oxford, MD 01/12/2023 6:21 PM  Please look on [email or web address] to find out who is on call.

## 2023-01-13 DIAGNOSIS — I4891 Unspecified atrial fibrillation: Secondary | ICD-10-CM | POA: Diagnosis not present

## 2023-01-13 DIAGNOSIS — A419 Sepsis, unspecified organism: Secondary | ICD-10-CM | POA: Diagnosis not present

## 2023-01-13 DIAGNOSIS — J189 Pneumonia, unspecified organism: Secondary | ICD-10-CM | POA: Diagnosis not present

## 2023-01-13 DIAGNOSIS — I482 Chronic atrial fibrillation, unspecified: Secondary | ICD-10-CM | POA: Diagnosis not present

## 2023-01-13 DIAGNOSIS — I2510 Atherosclerotic heart disease of native coronary artery without angina pectoris: Secondary | ICD-10-CM | POA: Diagnosis not present

## 2023-01-13 LAB — CBC
HCT: 29.3 % — ABNORMAL LOW (ref 39.0–52.0)
Hemoglobin: 8.6 g/dL — ABNORMAL LOW (ref 13.0–17.0)
MCH: 21.9 pg — ABNORMAL LOW (ref 26.0–34.0)
MCHC: 29.4 g/dL — ABNORMAL LOW (ref 30.0–36.0)
MCV: 74.7 fL — ABNORMAL LOW (ref 80.0–100.0)
Platelets: 286 10*3/uL (ref 150–400)
RBC: 3.92 MIL/uL — ABNORMAL LOW (ref 4.22–5.81)
RDW: 18.6 % — ABNORMAL HIGH (ref 11.5–15.5)
WBC: 16.3 10*3/uL — ABNORMAL HIGH (ref 4.0–10.5)
nRBC: 0 % (ref 0.0–0.2)

## 2023-01-13 LAB — BASIC METABOLIC PANEL
Anion gap: 11 (ref 5–15)
BUN: 22 mg/dL (ref 8–23)
CO2: 23 mmol/L (ref 22–32)
Calcium: 8.5 mg/dL — ABNORMAL LOW (ref 8.9–10.3)
Chloride: 99 mmol/L (ref 98–111)
Creatinine, Ser: 1.15 mg/dL (ref 0.61–1.24)
GFR, Estimated: 60 mL/min — ABNORMAL LOW (ref >60)
Glucose, Bld: 218 mg/dL — ABNORMAL HIGH (ref 70–99)
Potassium: 3.6 mmol/L (ref 3.5–5.1)
Sodium: 133 mmol/L — ABNORMAL LOW (ref 135–145)

## 2023-01-13 LAB — MAGNESIUM: Magnesium: 2.1 mg/dL (ref 1.7–2.4)

## 2023-01-13 LAB — GLUCOSE, CAPILLARY
Glucose-Capillary: 207 mg/dL — ABNORMAL HIGH (ref 70–99)
Glucose-Capillary: 283 mg/dL — ABNORMAL HIGH (ref 70–99)
Glucose-Capillary: 322 mg/dL — ABNORMAL HIGH (ref 70–99)
Glucose-Capillary: 408 mg/dL — ABNORMAL HIGH (ref 70–99)

## 2023-01-13 MED ORDER — METOPROLOL TARTRATE 50 MG PO TABS
50.0000 mg | ORAL_TABLET | Freq: Two times a day (BID) | ORAL | Status: DC
  Administered 2023-01-13 – 2023-01-15 (×4): 50 mg via ORAL
  Filled 2023-01-13 (×4): qty 1

## 2023-01-13 MED ORDER — DILTIAZEM HCL 30 MG PO TABS: 30.0000 mg | ORAL_TABLET | Freq: Four times a day (QID) | ORAL | Status: DC

## 2023-01-13 MED ORDER — INSULIN ASPART 100 UNIT/ML IJ SOLN
10.0000 [IU] | Freq: Once | INTRAMUSCULAR | Status: AC
  Administered 2023-01-13: 10 [IU] via SUBCUTANEOUS

## 2023-01-13 MED ORDER — INSULIN ASPART 100 UNIT/ML IJ SOLN
6.0000 [IU] | Freq: Three times a day (TID) | INTRAMUSCULAR | Status: DC
  Administered 2023-01-13 – 2023-01-14 (×4): 6 [IU] via SUBCUTANEOUS

## 2023-01-13 MED ORDER — ORAL CARE MOUTH RINSE: 15.0000 mL | OROMUCOSAL | Status: DC | PRN

## 2023-01-13 MED ORDER — METHYLPREDNISOLONE SODIUM SUCC 40 MG IJ SOLR
40.0000 mg | Freq: Two times a day (BID) | INTRAMUSCULAR | Status: DC
  Administered 2023-01-13 (×2): 40 mg via INTRAVENOUS
  Filled 2023-01-13 (×2): qty 1

## 2023-01-13 MED ORDER — INSULIN GLARGINE-YFGN 100 UNIT/ML ~~LOC~~ SOLN
15.0000 [IU] | Freq: Two times a day (BID) | SUBCUTANEOUS | Status: DC
  Administered 2023-01-13 – 2023-01-14 (×2): 15 [IU] via SUBCUTANEOUS
  Filled 2023-01-13 (×4): qty 0.15

## 2023-01-13 MED ORDER — DILTIAZEM HCL 60 MG PO TABS
60.0000 mg | ORAL_TABLET | Freq: Four times a day (QID) | ORAL | Status: DC
  Administered 2023-01-13: 60 mg via ORAL
  Filled 2023-01-13: qty 1

## 2023-01-13 MED ORDER — POTASSIUM CHLORIDE CRYS ER 20 MEQ PO TBCR
40.0000 meq | EXTENDED_RELEASE_TABLET | Freq: Once | ORAL | Status: AC
  Administered 2023-01-13: 40 meq via ORAL
  Filled 2023-01-13: qty 2

## 2023-01-13 NOTE — Inpatient Diabetes Management (Signed)
Inpatient Diabetes Program Recommendations  AACE/ADA: New Consensus Statement on Inpatient Glycemic Control (2015)  Target Ranges:  Prepandial:   less than 140 mg/dL      Peak postprandial:   less than 180 mg/dL (1-2 hours)      Critically ill patients:  140 - 180 mg/dL   Lab Results  Component Value Date   GLUCAP 283 (H) 01/13/2023   HGBA1C 7.3 (A) 09/30/2022    Review of Glycemic Control  Latest Reference Range & Units 01/12/23 07:25 01/12/23 11:18 01/12/23 16:16 01/12/23 21:21 01/13/23 07:27 01/13/23 11:22  Glucose-Capillary 70 - 99 mg/dL 213 (H) 086 (H) 578 (H) 258 (H) 207 (H) 283 (H)   Diabetes history: DM 2 Outpatient Diabetes medications: Jardiance 25 mg Daily, Metformin 500 mg bid, Lantus 38 units Daily Current orders for Inpatient glycemic control:  Semglee 20 units Daily Novolog 0-9 units tid + hs Novolog 3 units tid meal coverage  Solumedrol 40 mg Q12 hours A1c 7.3%  Inpatient Diabetes Program Recommendations:    -   consider increasing Novolog meal coverage to 5 units tid if eating >50% of meals.  Thanks,  Christena Deem RN, MSN, BC-ADM Inpatient Diabetes Coordinator Team Pager [phone] (8a-5p)

## 2023-01-13 NOTE — Progress Notes (Addendum)
Rounding Note    Patient Name: [name] Date of Encounter: 01/13/2023  [facility] Cardiologist: Peter Swaziland, MD   Subjective   Denies any CP or SOB. No discomfort overnight. Per nurse, some bradycardic event this morning.   Inpatient Medications    Scheduled Meds:  amoxicillin-clavulanate  1 tablet Oral Q12H   apixaban  5 mg Oral BID   atorvastatin  40 mg Oral QHS   azithromycin  500 mg Oral Daily   Chlorhexidine Gluconate Cloth  6 each Topical Daily   diltiazem  60 mg Oral Q6H   dutasteride  0.5 mg Oral QODAY   furosemide  20 mg Intravenous Daily   gabapentin  300 mg Oral QHS   insulin aspart  0-5 Units Subcutaneous QHS   insulin aspart  0-9 Units Subcutaneous TID WC   insulin aspart  3 Units Subcutaneous TID WC   insulin glargine-yfgn  20 Units Subcutaneous Daily   iron polysaccharides  150 mg Oral Daily   metoprolol tartrate  50 mg Oral Q8H   pantoprazole  40 mg Oral Q1200   QUEtiapine  12.5 mg Oral QPM   sodium chloride flush  3 mL Intravenous Q12H   tamsulosin  0.4 mg Oral QODAY   Continuous Infusions:  PRN Meds: acetaminophen **OR** acetaminophen, guaiFENesin-dextromethorphan, levalbuterol, methocarbamol, ondansetron **OR** ondansetron (ZOFRAN) IV, mouth rinse, senna-docusate, traMADol, traZODone   Vital Signs    Vitals:   01/13/23 0600 01/13/23 0615 01/13/23 0630 01/13/23 0645  BP: (!) 126/45 (!) 115/40 (!) 110/50 (!) 109/50  Pulse: 85 78 76 87  Resp: (!) 28 (!) 23 (!) 28 (!) 26  Temp:      TempSrc:      SpO2: 91% 93% 92% 92%  Weight:      Height:        Intake/Output Summary (Last 24 hours) at 01/13/2023 0751 Last data filed at 01/13/2023 0651 Gross per 24 hour  Intake 102.07 ml  Output 2250 ml  Net -2147.93 ml      01/13/2023    5:00 AM 01/12/2023    5:00 AM 01/09/2023   10:00 PM  Last 3 Weights  Weight (lbs) 196 lb 3.4 oz 198 lb 13.7 oz 186 lb  Weight (kg) 89 kg 90.2 kg 84.369 kg      Telemetry    Atrial  fibrillation, HR improved from 140 earlier yesterday to 80s overnight to 60s this morning. This morning, he begins to have occasional pauses, longest 2.5 sec.  - Personally Reviewed  ECG    No new EKG- Personally Reviewed  Physical Exam   GEN: No acute distress.   Neck: No JVD Cardiac: irregularly irregular, no murmurs, rubs, or gallops.  Respiratory: Clear to auscultation bilaterally. GI: Soft, nontender, non-distended  MS: No edema; No deformity. Neuro:  Nonfocal  Psych: Normal affect   Labs    High Sensitivity Troponin:  No results for input(s): "TROPONINIHS" in the last 720 hours.   Chemistry Recent Labs  Lab 01/09/23 1710 01/09/23 1717 01/10/23 0256 01/11/23 0255 01/12/23 0254 01/13/23 0611  NA 129*   < > 131* 133* 134* 133*  K 3.5   < > 3.3* 3.2* 3.5 3.6  CL 94*   < > 96* 98 102 99  CO2 23  --  22 21* 22 23  GLUCOSE 218*   < > 180* 165* 184* 218*  BUN 24*   < > 24* 24* 23 22  CREATININE 1.29*   < > 1.28* 1.19  0.97 1.15  CALCIUM 8.9  --  8.8* 8.8* 8.7* 8.5*  MG  --   --  2.0  --   --  2.1  PROT 7.1  --   --   --   --   --   ALBUMIN 3.4*  --   --   --   --   --   AST 18  --   --   --   --   --   ALT 14  --   --   --   --   --   ALKPHOS 62  --   --   --   --   --   BILITOT 1.5*  --   --   --   --   --   GFRNONAA 52*  --  53* 57* >60 60*  ANIONGAP 12  --  13 14 10 11    < > = values in this interval not displayed.    Lipids No results for input(s): "CHOL", "TRIG", "HDL", "LABVLDL", "LDLCALC", "CHOLHDL" in the last 168 hours.  Hematology Recent Labs  Lab 01/12/23 0254 01/12/23 1045 01/13/23 0611  WBC 20.1* 19.3* 16.3*  RBC 4.11* 4.45 3.92*  HGB 9.2* 9.9* 8.6*  HCT 30.4* 34.2* 29.3*  MCV 74.0* 76.9* 74.7*  MCH 22.4* 22.2* 21.9*  MCHC 30.3 28.9* 29.4*  RDW 18.4* 18.7* 18.6*  PLT 246 227 286   Thyroid  Recent Labs  Lab 01/11/23 1401  TSH 2.093    BNP Recent Labs  Lab 01/10/23 1244  BNP 472.9*    DDimer No results for input(s): "DDIMER" in the  last 168 hours.   Radiology    DG CHEST PORT 1 VIEW  Result Date: 01/12/2023 CLINICAL DATA:  Leukocytosis. EXAM: PORTABLE CHEST 1 VIEW COMPARISON:  January 09, 2023. FINDINGS: Stable cardiomegaly. Increased left lingular and basilar opacity is noted concerning for pneumonia or atelectasis with possible associated effusion. Minimal right basilar subsegmental atelectasis is noted. Sternotomy wires are noted. IMPRESSION: Increased left basilar opacity as noted above. Minimal right basilar subsegmental atelectasis. Aortic Atherosclerosis (ICD10-I70.0). Electronically Signed   By: Lupita Raider M.D.   On: 01/12/2023 13:23   CT HEAD WO CONTRAST ( )  Result Date: 01/11/2023 CLINICAL DATA:  Provided history: Mental status change, unknown cause. EXAM: CT HEAD WITHOUT CONTRAST TECHNIQUE: Contiguous axial images were obtained from the base of the skull through the vertex without intravenous contrast. RADIATION DOSE REDUCTION: This exam was performed according to the departmental dose-optimization program which includes automated exposure control, adjustment of the mA and/or kV according to patient size and/or use of iterative reconstruction technique. COMPARISON:  Head CT 10/04/2021.  Brain MRI 06/06/2016. FINDINGS: Brain: Generalized cerebral atrophy. Patchy and ill-defined hypoattenuation within the cerebral white matter, nonspecific but compatible with mild chronic small vessel ischemic disease. There is no acute intracranial hemorrhage. No demarcated cortical infarct. No extra-axial fluid collection. No evidence of an intracranial mass. No midline shift. Vascular: No hyperdense vessel.  Atherosclerotic calcifications. Skull: No calvarial fracture or aggressive osseous lesion. Sinuses/Orbits: No mass or acute finding within the imaged orbits. Trace mucosal thickening within the bilateral maxillary sinuses at the imaged levels. IMPRESSION: 1. No evidence of an acute intracranial abnormality. 2. Parenchymal  atrophy and chronic small vessel ischemic disease. Electronically Signed   By: Jackey Loge D.O.   On: 01/11/2023 15:58    Cardiac Studies   Cho 05/02/2022  1. Left ventricular ejection fraction, by estimation, is 60 to 65%.  The  left ventricle has normal function. The left ventricle has no regional  wall motion abnormalities. There is mild left ventricular hypertrophy.  Left ventricular diastolic parameters  are indeterminate.   2. Right ventricular systolic function was not well visualized. The right  ventricular size is not well visualized. RV is poorly visualized but  function appears moderately reduced   3. The mitral valve is normal in structure. No evidence of mitral valve  regurgitation. No evidence of mitral stenosis.   4. Left atrial size was severely dilated.   5. Aortic dilatation noted. There is dilatation of the ascending aorta,  measuring 40 mm.   6. The aortic valve is tricuspid. There is severe calcification of the  aortic valve. Aortic valve regurgitation is mild. Moderate to severe  aortic valve stenosis. Moderate AS by gradients (MG 24 mmHg, Vmax 3.0  m/s), severe by AVA (0.7cm^2) and DI  (0.24). Low SV index (28 cc/m^2), suspect paradoxical low flow low  gradient severe AS    Patient Profile     89 y.o. male with PMH of CAD s/p CABG around 1990 and DES to RCA 2016, moderate to severe AS, chronic HFpEF, permanent afib, HTN, IDDM, CKD stage III, GIB with ABL anemia 04/2022, TAA, pleural effusion s/p thoracentesis 2019, mild cognitive impairment, CVA and hiatal hernia admitted with sepsis due to PNA/hypoxic respiratory failure, hyponatremia, R hip periprosthetic fluid collection, acetabular lesion. Cardiology consulted for afib with RVR. Hospital course also complicated by sundowning and delirium.   Assessment & Plan    Suspected sepsis due to PNA with acute hypoxic resp failure  - MRI also demonstrated R hip periprosthetic fluid collection, seen by ortho, no plan for  aspiration or surgery at this time, recommend follow up as outpatient.   - abx per primary team  Permanent atrial fibrillation -on Eliquis  -h/o bradycardia improved with med adjustment. Initial HR likely exacerbated by PNA and sepsis. Patient has been on 50mg  BID metoprolol at home, due to RVR, metoprolol was increased to TID and IV diltiazem added.   - overnight, HR continue to slow down from the previous 140 --> 80s --> 60s. Occasional pauses, longest 2.5 sec. IV diltiazem stopped around 6:30AM this morning and transitioned to 30mg  QID short acting dosing. Will stop for now. If bradycardia persistent, will reduce metoprolol back down to home dose of 50mg  BID.   Chronic HFpEF: good urine output on 20mg  IV lasix. Patient is on 40mg  oral lasix at home. Appears to be euvolemic  Moderate to severe aortic stenosis: seen on previous echo in 04/2022  CAD s/p CABG 1990: denies any CP. Not on ASA given the need for Eliquis  Anemia  CKD stage III: stable.       For questions or updates, please contact [facility] Please consult [email or web address] for contact info under   Signed, Azalee Course, PA  01/13/2023,   ADDENDUM:   Patient seen and examined with APP, Azalee Course [name].  I personally taken a history, examined the patient, reviewed relevant notes,  laboratory data / imaging studies.  I performed a substantive portion of this encounter and formulated the important aspects of the plan.  I agree with the APP's note, impression, and recommendations; however, I have edited the note to reflect changes or salient points (which are noted in italics).   No chest pain.  No hear failure symptoms.  HR are better controlled.   Due to bradycardia and pauses Cardizem drip was discontinued.  PHYSICAL EXAM:  Today's Vitals   01/13/23 1700 01/13/23 1809 01/13/23 1944 01/13/23 2019  BP: (!) 126/55 (!) 153/63  115/66  Pulse: (!) 129 (!) 131  65  Resp: (!) 30 20  20   Temp:  97.6 F (36.4 C)  98.6 F  (37 C)  TempSrc:    Oral  SpO2: 97% 99%  97%  Weight:      Height:      PainSc:   0-No pain    Body mass index is 32.65 kg/m.   Net IO Since Admission: -3,676.72 mL [01/14/23 0018]  Filed Weights   01/09/23 2200 01/12/23 0500 01/13/23 0500  Weight: 84.4 kg 90.2 kg 89 kg    Physical Exam  Constitutional:  Age appropriate, hemodynamically stable, no acute distress.   Neck: No JVD present.  Cardiovascular: S1 normal and S2 normal. An irregularly irregular rhythm present. Tachycardia present. Exam reveals no gallop and no friction rub.  Murmur heard. Systolic murmur is present with a grade of 3/6 at the upper right sternal border. Pulmonary/Chest: Breath sounds normal. He has no wheezes. He has no rales. He exhibits no tenderness.  Abdominal: Soft. Bowel sounds are normal. He exhibits no distension. There is no abdominal tenderness.  Musculoskeletal:        General: No tenderness or edema.  Neurological: He is alert and oriented to person, place, and time.  Skin: Skin is warm and dry.    EKG: (personally reviewed by me) No new EKG  Telemetry: (personally reviewed by me) Atrial fibrillation mostly controlled A-fib but intermittent episodes of RVR   Impression:  A-fib with controlled ventricular rate-present on arrival. Sepsis due to multifocal pneumonia. Chronic HFpEF. Moderate to severe arctic stenosis Coronary artery disease without angina pectoris (CABG 1998, DES to RCA 2016)  Recommendations:  Atrial fibrillation with controlled ventricular rate. Ventricular rate has improved since admission. Had episodes of bradycardia and pause yesterday night and therefore Cardizem drip was discontinued.  He was transitioned to oral Cardizem along with oral metoprolol.  Given his age, LVEF, and valvular heart disease will discontinue Cardizem for now as his metoprolol dose has been increased from his baseline. Continue telemetry. Thromboembolic prophylaxis: Eliquis. Monitor for  now  Chronic HFpEF.  Euvolemic.  Continue current medical therapy.  Moderate to severe aortic stenosis: Currently asymptomatic, monitor for now  CAD status post four-vessel bypass: Denies angina pectoris, reemphasized the importance of secondary prevention with focus on improving her modifiable cardiovascular risk factors such as glycemic control, lipid management, blood pressure control, weight loss.  Plan of care discussed with patient, wife, and grandson at bedside   Further recommendations to follow as the case evolves.   This note was created using a voice recognition software as a result there may be grammatical errors inadvertently enclosed that do not reflect the nature of this encounter. Every attempt is made to correct such errors.   Tessa Lerner, DO, [facility]  [address] [place], [place] [address] Pager: [phone] Office: [phone] 01/14/2023

## 2023-01-13 NOTE — Plan of Care (Signed)
  Problem: Activity: Goal: Ability to tolerate increased activity will improve Outcome: Progressing   Problem: Clinical Measurements: Goal: Ability to maintain a body temperature in the normal range will improve Outcome: Progressing   Problem: Respiratory: Goal: Ability to maintain adequate ventilation will improve Outcome: Progressing Goal: Ability to maintain a clear airway will improve Outcome: Progressing   Problem: Education: Goal: Ability to describe self-care measures that may prevent or decrease complications (Diabetes Survival Skills Education) will improve Outcome: Progressing Goal: Individualized Educational Video(s) Outcome: Progressing   Problem: Coping: Goal: Ability to adjust to condition or change in health will improve Outcome: Progressing   Problem: Fluid Volume: Goal: Ability to maintain a balanced intake and output will improve Outcome: Progressing   Problem: Health Behavior/Discharge Planning: Goal: Ability to identify and utilize available resources and services will improve Outcome: Progressing Goal: Ability to manage health-related needs will improve Outcome: Progressing   Problem: Metabolic: Goal: Ability to maintain appropriate glucose levels will improve Outcome: Progressing   Problem: Nutritional: Goal: Maintenance of adequate nutrition will improve Outcome: Progressing Goal: Progress toward achieving an optimal weight will improve Outcome: Progressing   Problem: Skin Integrity: Goal: Risk for impaired skin integrity will decrease Outcome: Progressing   Problem: Tissue Perfusion: Goal: Adequacy of tissue perfusion will improve Outcome: Progressing   Problem: Fluid Volume: Goal: Hemodynamic stability will improve Outcome: Progressing   Problem: Clinical Measurements: Goal: Diagnostic test results will improve Outcome: Progressing Goal: Signs and symptoms of infection will decrease Outcome: Progressing   Problem:  Respiratory: Goal: Ability to maintain adequate ventilation will improve Outcome: Progressing   Problem: Education: Goal: Knowledge of General Education information will improve Description: Including pain rating scale, medication(s)/side effects and non-pharmacologic comfort measures Outcome: Progressing   Problem: Health Behavior/Discharge Planning: Goal: Ability to manage health-related needs will improve Outcome: Progressing   Problem: Clinical Measurements: Goal: Ability to maintain clinical measurements within normal limits will improve Outcome: Progressing Goal: Will remain free from infection Outcome: Progressing Goal: Diagnostic test results will improve Outcome: Progressing Goal: Respiratory complications will improve Outcome: Progressing Goal: Cardiovascular complication will be avoided Outcome: Progressing   Problem: Activity: Goal: Risk for activity intolerance will decrease Outcome: Progressing   Problem: Nutrition: Goal: Adequate nutrition will be maintained Outcome: Progressing   Problem: Coping: Goal: Level of anxiety will decrease Outcome: Progressing   Problem: Elimination: Goal: Will not experience complications related to bowel motility Outcome: Progressing Goal: Will not experience complications related to urinary retention Outcome: Progressing   Problem: Pain Management: Goal: General experience of comfort will improve Outcome: Progressing   Problem: Safety: Goal: Ability to remain free from injury will improve Outcome: Progressing   Problem: Skin Integrity: Goal: Risk for impaired skin integrity will decrease Outcome: Progressing   Problem: Safety: Goal: Non-violent Restraint(s) Outcome: Progressing

## 2023-01-13 NOTE — Progress Notes (Signed)
[facility] Progress Note Patient: [name] XLK:[id number] DOB: 03-26-1928 DOA: 01/09/2023  DOS: the patient was seen and examined on 01/13/2023  Brief hospital course: PMH of HTN, IDDM, CAD status post CABG in 1989 A-fib on Eliquis, chronic diastolic CHF, presented with fatigue, generalized malaise.  Currently being treated for sepsis due to pneumonia as well as A-fib with RVR. Incidentally was also found to have right periprosthetic fluid collection, which appears chronic, currently being managed conservatively.  Assessment and Plan: Sepsis due to community-acquired pneumonia. Present on admission. Acute hypoxic respiratory failure secondary to pneumonia. Met SIRS criteria on admission with fever tachycardia tachypnea. COVID-negative.  RSV and flu negative. Blood cultures so far negative. Urine strep and Legionella antigen negative as well. Leukocytosis improving. Was on IV ceftriaxone and Zithromax. Chest x-ray ordered by cardiology on 11/13 shows no significant change per my evaluation. No hypoxia for now. Will switch to oral antibiotics Augmentin and azithromycin.  Permanent A-fib with RVR. Intermittent RVR. Currently on Lopressor. Was on Cardizem in the past for rate control which was stopped due to bradycardic episodes. Given his persistent RVR due to infection initiated on IV Cardizem. On anticoagulation with Eliquis. Highly appreciate cardiology consultation.  Right periprosthetic fluid collection. Acetabular lesion. Orthopedic consulted. 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 Outpatient follow-up recommended. No intervention.  Acute metabolic encephalopathy. Potentially sundowning. Continue to monitor for now.  Hyponatremia. Resolved.  CAD SP CABG. No angina for now. Monitor.  Type 2 diabetes mellitus, on long-term insulin with nephropathy Hemoglobin A1c 7.3. Now with steroid-induced hyperglycemia. Blood sugar elevated. Will  modify regimen.  Continue carb modified diet.  Chronic HFpEF. Volume level adequate for now.  On IV Lasix. Monitor.  Moderate to severe arctic stenosis. Monitor for now.  CKD 3A. Renal function at baseline. Monitor for now.  Macrocytic anemia. Chronic in nature No active bleeding. Monitor.  Obesity Class 1 Body mass index is 33.09 kg/m.  Placing the pt at higher risk of poor outcomes.    Subjective: No nausea no vomiting.  Cardizem was stopped earlier due to bradycardia and pause.  No fever no chills.  Breathing better.  Physical Exam: General: in Mild distress, No Rash Cardiovascular: S1 and S2 Present, No Murmur Respiratory: Good respiratory effort, Bilateral Air entry present.  Diminished.  Basal crackles, expiratory wheezes Abdomen: Bowel Sound present, No tenderness Extremities: No edema Neuro: Alert and oriented x3, no new focal deficit  Data Reviewed: I have Reviewed nursing notes, Vitals, and Lab results. Since last encounter, pertinent lab results CBC and BMP   . I have ordered test including CBC and BMP  .   Disposition: Status is: Inpatient Remains inpatient appropriate because: Monitor for improvement in respiratory status apixaban (ELIQUIS) tablet 5 mg   Family Communication: Family at bedside Level of care: Progressive   Vitals:   01/13/23 1100 01/13/23 1200 01/13/23 1300 01/13/23 1600  BP: (!) 103/90 (!) 130/48    Pulse: 93  100   Resp: 16 (!) 26 (!) 33   Temp:  97.7 F (36.5 C)  98.6 F (37 C)  TempSrc:  Oral  Oral  SpO2: 100%  94%   Weight:      Height:         Author: Lynden Oxford, MD 01/13/2023 5:35 PM  Please look on [email or web address] to find out who is on call.

## 2023-01-14 DIAGNOSIS — J189 Pneumonia, unspecified organism: Secondary | ICD-10-CM | POA: Diagnosis not present

## 2023-01-14 DIAGNOSIS — I482 Chronic atrial fibrillation, unspecified: Secondary | ICD-10-CM | POA: Diagnosis not present

## 2023-01-14 DIAGNOSIS — I4891 Unspecified atrial fibrillation: Secondary | ICD-10-CM | POA: Diagnosis not present

## 2023-01-14 DIAGNOSIS — I2510 Atherosclerotic heart disease of native coronary artery without angina pectoris: Secondary | ICD-10-CM | POA: Diagnosis not present

## 2023-01-14 LAB — CBC
HCT: 32 % — ABNORMAL LOW (ref 39.0–52.0)
Hemoglobin: 9.9 g/dL — ABNORMAL LOW (ref 13.0–17.0)
MCH: 22.4 pg — ABNORMAL LOW (ref 26.0–34.0)
MCHC: 30.9 g/dL (ref 30.0–36.0)
MCV: 72.4 fL — ABNORMAL LOW (ref 80.0–100.0)
Platelets: 212 10*3/uL (ref 150–400)
RBC: 4.42 MIL/uL (ref 4.22–5.81)
RDW: 19 % — ABNORMAL HIGH (ref 11.5–15.5)
WBC: 15.5 10*3/uL — ABNORMAL HIGH (ref 4.0–10.5)
nRBC: 0 % (ref 0.0–0.2)

## 2023-01-14 LAB — BASIC METABOLIC PANEL
Anion gap: 11 (ref 5–15)
BUN: 26 mg/dL — ABNORMAL HIGH (ref 8–23)
CO2: 20 mmol/L — ABNORMAL LOW (ref 22–32)
Calcium: 8.8 mg/dL — ABNORMAL LOW (ref 8.9–10.3)
Chloride: 102 mmol/L (ref 98–111)
Creatinine, Ser: 0.74 mg/dL (ref 0.61–1.24)
GFR, Estimated: >60 mL/min (ref >60)
Glucose, Bld: 294 mg/dL — ABNORMAL HIGH (ref 70–99)
Potassium: 4.7 mmol/L (ref 3.5–5.1)
Sodium: 133 mmol/L — ABNORMAL LOW (ref 135–145)

## 2023-01-14 LAB — GLUCOSE, CAPILLARY
Glucose-Capillary: 242 mg/dL — ABNORMAL HIGH (ref 70–99)
Glucose-Capillary: 313 mg/dL — ABNORMAL HIGH (ref 70–99)
Glucose-Capillary: 385 mg/dL — ABNORMAL HIGH (ref 70–99)
Glucose-Capillary: 336 mg/dL — ABNORMAL HIGH (ref 70–99)

## 2023-01-14 LAB — CULTURE, BLOOD (ROUTINE X 2)
Culture: NO GROWTH
Culture: NO GROWTH
Special Requests: ADEQUATE

## 2023-01-14 LAB — MAGNESIUM: Magnesium: 2.1 mg/dL (ref 1.7–2.4)

## 2023-01-14 MED ORDER — INSULIN ASPART 100 UNIT/ML IJ SOLN
8.0000 [IU] | Freq: Three times a day (TID) | INTRAMUSCULAR | Status: DC
  Administered 2023-01-15 (×2): 8 [IU] via SUBCUTANEOUS

## 2023-01-14 MED ORDER — INSULIN ASPART 100 UNIT/ML IJ SOLN
0.0000 [IU] | Freq: Three times a day (TID) | INTRAMUSCULAR | Status: DC
  Administered 2023-01-15 (×2): 7 [IU] via SUBCUTANEOUS

## 2023-01-14 MED ORDER — INSULIN GLARGINE-YFGN 100 UNIT/ML ~~LOC~~ SOLN
20.0000 [IU] | Freq: Two times a day (BID) | SUBCUTANEOUS | Status: DC
  Administered 2023-01-14 – 2023-01-15 (×2): 20 [IU] via SUBCUTANEOUS
  Filled 2023-01-14 (×3): qty 0.2

## 2023-01-14 MED ORDER — INSULIN ASPART 100 UNIT/ML IJ SOLN: 8.0000 [IU] | Freq: Three times a day (TID) | INTRAMUSCULAR | Status: DC

## 2023-01-14 MED ORDER — DILTIAZEM HCL ER COATED BEADS 120 MG PO CP24
120.0000 mg | ORAL_CAPSULE | Freq: Every day | ORAL | Status: DC
  Administered 2023-01-14 – 2023-01-15 (×2): 120 mg via ORAL
  Filled 2023-01-14 (×2): qty 1

## 2023-01-14 MED ORDER — INSULIN ASPART 100 UNIT/ML IJ SOLN
0.0000 [IU] | Freq: Every day | INTRAMUSCULAR | Status: DC
  Administered 2023-01-14: 4 [IU] via SUBCUTANEOUS

## 2023-01-14 NOTE — Plan of Care (Signed)
  Problem: Safety: Goal: Ability to remain free from injury will improve Outcome: Progressing   

## 2023-01-14 NOTE — Progress Notes (Addendum)
Rounding Note    Patient Name: [name] Date of Encounter: 01/14/2023  [facility] Cardiologist: Peter Swaziland, MD   Subjective   Denies any CP or SOB. Per nurse and telemetry, HR overnight 80-90s, when he sat up this morning and transferred to bed side chair, HR increased to 120-130s. HR has been persistent elevated while sitting up.   Inpatient Medications    Scheduled Meds:  apixaban  5 mg Oral BID   atorvastatin  40 mg Oral QHS   Chlorhexidine Gluconate Cloth  6 each Topical Daily   diltiazem  120 mg Oral Daily   dutasteride  0.5 mg Oral QODAY   furosemide  20 mg Intravenous Daily   gabapentin  300 mg Oral QHS   insulin aspart  0-5 Units Subcutaneous QHS   insulin aspart  0-9 Units Subcutaneous TID WC   insulin aspart  6 Units Subcutaneous TID WC   insulin glargine-yfgn  15 Units Subcutaneous BID   iron polysaccharides  150 mg Oral Daily   metoprolol tartrate  50 mg Oral BID   pantoprazole  40 mg Oral Q1200   sodium chloride flush  3 mL Intravenous Q12H   tamsulosin  0.4 mg Oral QODAY   Continuous Infusions:  PRN Meds: acetaminophen **OR** acetaminophen, guaiFENesin-dextromethorphan, levalbuterol, methocarbamol, ondansetron **OR** ondansetron (ZOFRAN) IV, mouth rinse, senna-docusate, traMADol, traZODone   Vital Signs    Vitals:   01/13/23 2019 01/14/23 0031 01/14/23 0448 01/14/23 0802  BP: 115/66 131/72 123/72 139/70  Pulse: 65 76 99 90  Resp: 20 16 18 18   Temp: 98.6 F (37 C) 97.6 F (36.4 C) (!) 97.5 F (36.4 C) (!) 97.5 F (36.4 C)  TempSrc: Oral Oral Oral Oral  SpO2: 97% 97% 93% 95%  Weight:   90.1 kg   Height:        Intake/Output Summary (Last 24 hours) at 01/14/2023 0832 Last data filed at 01/14/2023 0452 Gross per 24 hour  Intake --  Output 1700 ml  Net -1700 ml      01/14/2023    4:48 AM 01/13/2023    5:00 AM 01/12/2023    5:00 AM  Last 3 Weights  Weight (lbs) 198 lb 10.2 oz 196 lb 3.4 oz 198 lb 13.7 oz  Weight  (kg) 90.1 kg 89 kg 90.2 kg      Telemetry    Atrial fibrillation, HR 80-90s overnight. After the patient got up around 7:30AM and transferred to bedside chair, HR elevated in 120-130s - Personally Reviewed  ECG    Atrial fibrillation - Personally Reviewed  Physical Exam   GEN: No acute distress.   Neck: No JVD Cardiac: irregularly irregular, no murmurs, rubs, or gallops.  Respiratory: occasional rhonchi GI: Soft, nontender, non-distended  MS: No edema; No deformity. Neuro:  Nonfocal  Psych: Normal affect   Labs    High Sensitivity Troponin:  No results for input(s): "TROPONINIHS" in the last 720 hours.   Chemistry Recent Labs  Lab 01/09/23 1710 01/09/23 1717 01/10/23 0256 01/11/23 0255 01/12/23 0254 01/13/23 0611 01/14/23 0501  NA 129*   < > 131*   < > 134* 133* 133*  K 3.5   < > 3.3*   < > 3.5 3.6 4.7  CL 94*   < > 96*   < > 102 99 102  CO2 23  --  22   < > 22 23 20*  GLUCOSE 218*   < > 180*   < > 184* 218* 294*  BUN 24*   < > 24*   < > 23 22 26*  CREATININE 1.29*   < > 1.28*   < > 0.97 1.15 0.74  CALCIUM 8.9  --  8.8*   < > 8.7* 8.5* 8.8*  MG  --   --  2.0  --   --  2.1 2.1  PROT 7.1  --   --   --   --   --   --   ALBUMIN 3.4*  --   --   --   --   --   --   AST 18  --   --   --   --   --   --   ALT 14  --   --   --   --   --   --   ALKPHOS 62  --   --   --   --   --   --   BILITOT 1.5*  --   --   --   --   --   --   GFRNONAA 52*  --  53*   < > >60 60* >60  ANIONGAP 12  --  13   < > 10 11 11    < > = values in this interval not displayed.    Lipids No results for input(s): "CHOL", "TRIG", "HDL", "LABVLDL", "LDLCALC", "CHOLHDL" in the last 168 hours.  Hematology Recent Labs  Lab 01/12/23 1045 01/13/23 0611 01/14/23 0501  WBC 19.3* 16.3* 15.5*  RBC 4.45 3.92* 4.42  HGB 9.9* 8.6* 9.9*  HCT 34.2* 29.3* 32.0*  MCV 76.9* 74.7* 72.4*  MCH 22.2* 21.9* 22.4*  MCHC 28.9* 29.4* 30.9  RDW 18.7* 18.6* 19.0*  PLT 227 286 212   Thyroid  Recent Labs  Lab  01/11/23 1401  TSH 2.093    BNP Recent Labs  Lab 01/10/23 1244  BNP 472.9*    DDimer No results for input(s): "DDIMER" in the last 168 hours.   Radiology    DG CHEST PORT 1 VIEW  Result Date: 01/12/2023 CLINICAL DATA:  Leukocytosis. EXAM: PORTABLE CHEST 1 VIEW COMPARISON:  January 09, 2023. FINDINGS: Stable cardiomegaly. Increased left lingular and basilar opacity is noted concerning for pneumonia or atelectasis with possible associated effusion. Minimal right basilar subsegmental atelectasis is noted. Sternotomy wires are noted. IMPRESSION: Increased left basilar opacity as noted above. Minimal right basilar subsegmental atelectasis. Aortic Atherosclerosis (ICD10-I70.0). Electronically Signed   By: Lupita Raider M.D.   On: 01/12/2023 13:23    Cardiac Studies   Echo  05/02/2022  1. Left ventricular ejection fraction, by estimation, is 60 to 65%. The  left ventricle has normal function. The left ventricle has no regional  wall motion abnormalities. There is mild left ventricular hypertrophy.  Left ventricular diastolic parameters  are indeterminate.   2. Right ventricular systolic function was not well visualized. The right  ventricular size is not well visualized. RV is poorly visualized but  function appears moderately reduced   3. The mitral valve is normal in structure. No evidence of mitral valve  regurgitation. No evidence of mitral stenosis.   4. Left atrial size was severely dilated.   5. Aortic dilatation noted. There is dilatation of the ascending aorta,  measuring 40 mm.   6. The aortic valve is tricuspid. There is severe calcification of the  aortic valve. Aortic valve regurgitation is mild. Moderate to severe  aortic valve stenosis. Moderate AS by gradients (MG 24 mmHg,  Vmax 3.0  m/s), severe by AVA (0.7cm^2) and DI  (0.24). Low SV index (28 cc/m^2), suspect paradoxical low flow low  gradient severe AS   Patient Profile     89 y.o. male with PMH of CAD s/p  CABG around 1990 and DES to RCA 2016, moderate to severe AS, chronic HFpEF, permanent afib, HTN, IDDM, CKD stage III, GIB with ABL anemia 04/2022, TAA, pleural effusion s/p thoracentesis 2019, mild cognitive impairment, CVA and hiatal hernia admitted with sepsis due to PNA/hypoxic respiratory failure, hyponatremia, R hip periprosthetic fluid collection, acetabular lesion. Cardiology consulted for afib with RVR. Hospital course also complicated by sundowning and delirium.   Assessment & Plan    Suspected sepsis due to PNA with acute hypoxic resp failure             - MRI also demonstrated R hip periprosthetic fluid collection, seen by ortho, no plan for aspiration or surgery at this time, recommend follow up as outpatient.              - abx per primary team   Permanent atrial fibrillation -on Eliquis             -h/o bradycardia improved with med adjustment. Initial HR likely exacerbated by PNA and sepsis. Patient has been on 50mg  BID metoprolol at home, due to RVR, metoprolol was increased to TID and IV diltiazem added.              - Patient had bradycardia and pauses up to 2.5 sec yesterday. IV diltiazem discontinued. His metoprolol tartrate was reduced back down to home dose of 50mg  BID yesterday afternoon. Primary team added cardizem CD 120mg  daily starting today. HR overnight in the 80-90s, when he got up to sit in the bedside sofa, HR increased to 120-130s while sitting up. Will see the effect of cardizem today.     Chronic HFpEF: good urine output on 20mg  IV lasix. Patient is on 40mg  oral lasix at home. Appears to be euvolemic   Moderate to severe aortic stenosis: seen on previous echo in 04/2022   CAD s/p CABG 1990: denies any CP. Not on ASA given the need for Eliquis   Anemia   CKD stage III: stable.       For questions or updates, please contact [facility] Please consult [email or web address] for contact info under   Signed, Azalee Course, [place]  01/14/2023  ADDENDUM:   Patient  seen and examined with APP, Azalee Course PA.  I personally taken a history, examined the patient, reviewed relevant notes,  laboratory data / imaging studies.  I performed a substantive portion of this encounter and formulated the important aspects of the plan.  I agree with the APP's note, impression, and recommendations; however, I have edited the note to reflect changes or salient points (which are noted in italics).   Patient seen and examined at bedside.  Wife is present. No events overnight. No longer in ICU. Ventricular rate well-controlled on telemetry  PHYSICAL EXAM: Today's Vitals   01/14/23 0802 01/14/23 0849 01/14/23 0907 01/14/23 1241  BP: 139/70  (!) 140/70 128/67  Pulse: 90  (!) 105 (!) 52  Resp: 18   18  Temp: (!) 97.5 F (36.4 C)   (!) 97.5 F (36.4 C)  TempSrc: Oral   Oral  SpO2: 95%   97%  Weight:      Height:      PainSc:  0-No pain     Body mass index  is 33.05 kg/m.   Net IO Since Admission: -4,306.72 mL [01/14/23 1643]  Filed Weights   01/12/23 0500 01/13/23 0500 01/14/23 0448  Weight: 90.2 kg 89 kg 90.1 kg    Physical Exam  Constitutional:  Age appropriate, hemodynamically stable, no acute distress.   Neck: No JVD present.  Cardiovascular: Normal rate, S1 normal and S2 normal. An irregularly irregular rhythm present. Exam reveals no gallop and no friction rub.  Murmur heard. Systolic murmur is present with a grade of 3/6 at the upper right sternal border. Pulmonary/Chest: Breath sounds normal. He has no wheezes. He has no rales. He exhibits no tenderness.  Abdominal: Soft. Bowel sounds are normal. He exhibits no distension. There is no abdominal tenderness.  Musculoskeletal:        General: No tenderness or edema.  Neurological: He is alert and oriented to person, place, and time.  Skin: Skin is warm and dry.    EKG: (personally reviewed by me) No new EKGs.  Telemetry: (personally reviewed by me) Atrial fibrillation with controlled ventricular  rate   Impression:  Atrial fibrillation with controlled ventricular rate. Sepsis due to multifocal pneumonia. Chronic HFpEF. Aortic stenosis. Coronary artery disease without angina pectoris. History of CABG in 1998 and status post DES to the RCA in 2016  Recommendations:  Cardiology was consulted during his hospitalization for A-fib with RVR at presentation.  His A-fib with RVR was physiological given the multifocal pneumonia/sepsis.  However given his comorbidities and aortic stenosis uptitrate medical therapy. He takes metoprolol twice a day at home which was initially increased to 3 times daily dosing. While he was in the ICU primary team added IV Cardizem drip for further rate control strategy.  However at night he had episodes of bradycardia and short pauses and therefore IV Cardizem drip was discontinued. For now he is back to his home dose of metoprolol and primary team started Cardizem 120 mg p.o. daily. Will continue to monitor the patient on the telemetry.    Management of his other chronic comorbid conditions/problem list per primary team.  Plan of care has been discussed with attending physician.  Cardiology will follow on as needed basis.  But please reach out if any questions or concerns arise over the weekend.    This note was created using a voice recognition software as a result there may be grammatical errors inadvertently enclosed that do not reflect the nature of this encounter. Every attempt is made to correct such errors.   Tessa Lerner, DO, [facility]  [address] [place], [place] [address] Pager: [phone] Office: [phone] 01/14/2023 4:43 PM

## 2023-01-14 NOTE — Plan of Care (Signed)
  Problem: Activity: Goal: Ability to tolerate increased activity will improve Outcome: Progressing   Problem: Clinical Measurements: Goal: Ability to maintain a body temperature in the normal range will improve Outcome: Progressing   Problem: Respiratory: Goal: Ability to maintain adequate ventilation will improve Outcome: Progressing Goal: Ability to maintain a clear airway will improve Outcome: Progressing   Problem: Education: Goal: Ability to describe self-care measures that may prevent or decrease complications (Diabetes Survival Skills Education) will improve Outcome: Progressing Goal: Individualized Educational Video(s) Outcome: Progressing   Problem: Coping: Goal: Ability to adjust to condition or change in health will improve Outcome: Progressing   Problem: Fluid Volume: Goal: Ability to maintain a balanced intake and output will improve Outcome: Progressing   Problem: Health Behavior/Discharge Planning: Goal: Ability to identify and utilize available resources and services will improve Outcome: Progressing Goal: Ability to manage health-related needs will improve Outcome: Progressing   Problem: Metabolic: Goal: Ability to maintain appropriate glucose levels will improve Outcome: Progressing   Problem: Nutritional: Goal: Maintenance of adequate nutrition will improve Outcome: Progressing Goal: Progress toward achieving an optimal weight will improve Outcome: Progressing   Problem: Skin Integrity: Goal: Risk for impaired skin integrity will decrease Outcome: Progressing   Problem: Tissue Perfusion: Goal: Adequacy of tissue perfusion will improve Outcome: Progressing   Problem: Fluid Volume: Goal: Hemodynamic stability will improve Outcome: Progressing   Problem: Clinical Measurements: Goal: Diagnostic test results will improve Outcome: Progressing Goal: Signs and symptoms of infection will decrease Outcome: Progressing   Problem:  Respiratory: Goal: Ability to maintain adequate ventilation will improve Outcome: Progressing   Problem: Education: Goal: Knowledge of General Education information will improve Description: Including pain rating scale, medication(s)/side effects and non-pharmacologic comfort measures Outcome: Progressing   Problem: Health Behavior/Discharge Planning: Goal: Ability to manage health-related needs will improve Outcome: Progressing   Problem: Clinical Measurements: Goal: Ability to maintain clinical measurements within normal limits will improve Outcome: Progressing Goal: Will remain free from infection Outcome: Progressing Goal: Diagnostic test results will improve Outcome: Progressing Goal: Respiratory complications will improve Outcome: Progressing Goal: Cardiovascular complication will be avoided Outcome: Progressing   Problem: Activity: Goal: Risk for activity intolerance will decrease Outcome: Progressing   Problem: Nutrition: Goal: Adequate nutrition will be maintained Outcome: Progressing   Problem: Coping: Goal: Level of anxiety will decrease Outcome: Progressing   Problem: Elimination: Goal: Will not experience complications related to bowel motility Outcome: Progressing Goal: Will not experience complications related to urinary retention Outcome: Progressing   Problem: Pain Management: Goal: General experience of comfort will improve Outcome: Progressing   Problem: Safety: Goal: Ability to remain free from injury will improve Outcome: Progressing   Problem: Skin Integrity: Goal: Risk for impaired skin integrity will decrease Outcome: Progressing   Problem: Safety: Goal: Non-violent Restraint(s) Outcome: Progressing

## 2023-01-14 NOTE — Progress Notes (Signed)
[facility] Progress Note Patient: [name] ZOX:[id number] DOB: June 16, 1928 DOA: 01/09/2023  DOS: the patient was seen and examined on 01/14/2023  Brief hospital course: PMH of HTN, IDDM, CAD status post CABG in 1989 A-fib on Eliquis, chronic diastolic CHF, presented with fatigue, generalized malaise.  Currently being treated for sepsis due to pneumonia as well as A-fib with RVR. Incidentally was also found to have right periprosthetic fluid collection, which appears chronic, currently being managed conservatively.  Assessment and Plan: Sepsis due to community-acquired pneumonia. Present on admission. Acute hypoxic respiratory failure secondary to pneumonia. Met SIRS criteria on admission with fever tachycardia tachypnea. COVID-negative.  RSV and flu negative. Blood cultures so far negative. Urine strep and Legionella antigen negative as well. Leukocytosis improving. Was on IV ceftriaxone and Zithromax. Chest x-ray ordered by cardiology on 11/13 shows no significant change per my evaluation. No hypoxia for now. Continue oral antibiotics Augmentin and azithromycin.  Permanent A-fib with RVR. Intermittent RVR. Currently on Lopressor. Was on Cardizem in the past for rate control which was stopped due to bradycardic episodes. Given his persistent RVR due to infection initiated on IV Cardizem. On anticoagulation with Eliquis. Highly appreciate cardiology consultation. Now back on Cardizem 120 mg.  Right periprosthetic fluid collection. Acetabular lesion. Orthopedic consulted. 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 Outpatient follow-up recommended. No intervention.  Acute metabolic encephalopathy. Potentially sundowning. Continue to monitor for now.  Hyponatremia. Resolved.  CAD SP CABG. No angina for now. Monitor.  Type 2 diabetes mellitus, on long-term insulin with nephropathy Hemoglobin A1c 7.3. Now with steroid-induced  hyperglycemia. Blood sugar elevated. Will modify regimen.  Continue carb modified diet.  Chronic HFpEF. Volume level adequate for now.  On Lasix. Monitor.  Moderate to severe arctic stenosis. Monitor for now.  CKD 3A. Renal function at baseline. Monitor for now.  Macrocytic anemia. Chronic in nature No active bleeding. Monitor.  Obesity Class 1 Body mass index is 33.09 kg/m.  Placing the pt at higher risk of poor outcomes.    Subjective: No nausea no vomiting no fever no chills.  No chest pain.  No shortness of breath.  Blood sugars remain elevated.  Physical Exam: General: in Mild distress, No Rash Cardiovascular: S1 and S2 Present, No Murmur Respiratory: Good respiratory effort, Bilateral Air entry present.  Bilateral diminished air movement.  No Crackles, No wheezes Abdomen: Bowel Sound present, No tenderness Extremities: No edema Neuro: Alert and oriented x3, no new focal deficit  Data Reviewed: I have Reviewed nursing notes, Vitals, and Lab results. Since last encounter, pertinent lab results CBC and BMP   . I have ordered test including CBC and BMP  . I have discussed pt's care plan and test results with cardiology  .   Disposition: Status is: Inpatient Remains inpatient appropriate because: Monitor for improvement in heart rate control  apixaban (ELIQUIS) tablet 5 mg   Family Communication: Family at bedside Level of care: Progressive   Vitals:   01/14/23 0448 01/14/23 0802 01/14/23 0907 01/14/23 1241  BP: 123/72 139/70 (!) 140/70 128/67  Pulse: 99 90 (!) 105 (!) 52  Resp: 18 18  18   Temp: (!) 97.5 F (36.4 C) (!) 97.5 F (36.4 C)  (!) 97.5 F (36.4 C)  TempSrc: Oral Oral  Oral  SpO2: 93% 95%  97%  Weight: 90.1 kg     Height:         Author: Lynden Oxford, MD 01/14/2023 5:51 PM  Please look on [email or web address] to find out who  is on call.

## 2023-01-14 NOTE — TOC Progression Note (Signed)
Transition of Care [facility]) - Progression Note    Patient Details  Name: [name] MRN: [id number] Date of Birth: 02/08/1929  [facility]) CM/SW Contact  Sharisse Rantz, Olegario Messier, RN Phone Number: 01/14/2023, 4:07 PM  Clinical Narrative:  Per prior note [facility] to f/u on Monday since closed on weekend. Rep Kennon Rounds [phone].          Expected Discharge Plan and Services                                               Social Determinants of Health (SDOH) Interventions SDOH Screenings   Food Insecurity: No Food Insecurity (01/09/2023)  Housing: Low Risk  (01/09/2023)  Transportation Needs: No Transportation Needs (01/09/2023)  Utilities: Not At Risk (01/09/2023)  Depression (PHQ2-9): Low Risk  (12/07/2022)  Financial Resource Strain: Low Risk  (03/11/2022)  Physical Activity: Inactive (03/11/2022)  Social Connections: Moderately Isolated (03/11/2022)  Stress: No Stress Concern Present (03/11/2022)  Tobacco Use: Medium Risk (01/09/2023)    Readmission Risk Interventions    01/11/2023    5:06 PM  Readmission Risk Prevention Plan  Transportation Screening Not Complete  Transportation Screening Comment Unable to speak to wife or patient.  PCP or Specialist Appt within 3-5 Days Complete  HRI or Home Care Consult Complete  Social Work Consult for Recovery Care Planning/Counseling Complete  Palliative Care Screening Complete  Medication Review Oceanographer) Referral to Pharmacy

## 2023-01-14 NOTE — Progress Notes (Signed)
Mobility Specialist - Progress Note  Pre-mobility: 109 bpm HR,  During mobility: 156 bpm HR, Post-mobility: 113 bpm HR,    01/14/23 1114  Mobility  Activity Ambulated with assistance in hallway  Level of Assistance Standby assist, set-up cues, supervision of patient - no hands on  Assistive Device Front wheel walker  Distance Ambulated (ft) 200 ft  Range of Motion/Exercises Active  Activity Response Tolerated fair  Mobility Referral Yes  $Mobility charge 1 Mobility  Mobility Specialist Start Time (ACUTE ONLY) 1100  Mobility Specialist Stop Time (ACUTE ONLY) 1114  Mobility Specialist Time Calculation (min) (ACUTE ONLY) 14 min   Pt was found on recliner chair and agreeable to ambulate. No complaints with session. Pt HR fluctuated between 120-140s throughout session reaching as high as 156 bpm. At EOS returned to recliner chair with all needs met. Call bell in reach and family in room.  Billey Chang Mobility Specialist

## 2023-01-15 DIAGNOSIS — J189 Pneumonia, unspecified organism: Secondary | ICD-10-CM | POA: Diagnosis not present

## 2023-01-15 DIAGNOSIS — A419 Sepsis, unspecified organism: Secondary | ICD-10-CM | POA: Diagnosis not present

## 2023-01-15 LAB — CBC
HCT: 30.1 % — ABNORMAL LOW (ref 39.0–52.0)
Hemoglobin: 9.2 g/dL — ABNORMAL LOW (ref 13.0–17.0)
MCH: 22.8 pg — ABNORMAL LOW (ref 26.0–34.0)
MCHC: 30.6 g/dL (ref 30.0–36.0)
MCV: 74.5 fL — ABNORMAL LOW (ref 80.0–100.0)
Platelets: 467 10*3/uL — ABNORMAL HIGH (ref 150–400)
RBC: 4.04 MIL/uL — ABNORMAL LOW (ref 4.22–5.81)
RDW: 19.4 % — ABNORMAL HIGH (ref 11.5–15.5)
WBC: 24.7 10*3/uL — ABNORMAL HIGH (ref 4.0–10.5)
nRBC: 0.1 % (ref 0.0–0.2)

## 2023-01-15 LAB — BASIC METABOLIC PANEL
Anion gap: 9 (ref 5–15)
BUN: 33 mg/dL — ABNORMAL HIGH (ref 8–23)
CO2: 23 mmol/L (ref 22–32)
Calcium: 8.7 mg/dL — ABNORMAL LOW (ref 8.9–10.3)
Chloride: 101 mmol/L (ref 98–111)
Creatinine, Ser: 1.12 mg/dL (ref 0.61–1.24)
GFR, Estimated: >60 mL/min (ref >60)
Glucose, Bld: 263 mg/dL — ABNORMAL HIGH (ref 70–99)
Potassium: 3.9 mmol/L (ref 3.5–5.1)
Sodium: 133 mmol/L — ABNORMAL LOW (ref 135–145)

## 2023-01-15 LAB — GLUCOSE, CAPILLARY
Glucose-Capillary: 246 mg/dL — ABNORMAL HIGH (ref 70–99)
Glucose-Capillary: 207 mg/dL — ABNORMAL HIGH (ref 70–99)

## 2023-01-15 LAB — MAGNESIUM: Magnesium: 2.2 mg/dL (ref 1.7–2.4)

## 2023-01-15 MED ORDER — DILTIAZEM HCL ER COATED BEADS 120 MG PO CP24: 120.0000 mg | ORAL_CAPSULE | Freq: Every day | ORAL | 0 refills | Status: DC

## 2023-01-15 NOTE — Progress Notes (Signed)
Pt given and explained discharge instructions to pt and wife. All questions answered. IV's removed. Tele removed. Pt dressed in personal clothing, TOC spoke with pt and resources were added to AVS. RN printed new AVS and gave to daughter in law before pt was discharged. Pt was taken to main entrance via wheelchair.

## 2023-01-15 NOTE — TOC Transition Note (Signed)
Transition of Care [facility]) - CM/SW Discharge Note   Patient Details  Name: [name] MRN: [id number] Date of Birth: 01-09-1929  [facility]) CM/SW Contact:  Adrian Prows, RN Phone Number: 01/15/2023, 4:14 PM   Clinical Narrative:    Spoke w/ pt, wife, and family in room; [facility], and the agency was previously contacted regarding their request for info r/t eligible services; they were also given resources for transportation, senior services, and social services; they will make appt at agency of choice; they were also encouraged to contact insurance and [facility] to discuss benefits; no TOC needs.   Final next level of care: Home/Self Care Barriers to Discharge: No Barriers Identified   Patient Goals and CMS Choice      Discharge Placement                         Discharge Plan and Services Additional resources added to the After Visit Summary for                                       Social Determinants of Health (SDOH) Interventions SDOH Screenings   Food Insecurity: No Food Insecurity (01/09/2023)  Housing: Low Risk  (01/09/2023)  Transportation Needs: No Transportation Needs (01/09/2023)  Utilities: Not At Risk (01/09/2023)  Depression (PHQ2-9): Low Risk  (12/07/2022)  Financial Resource Strain: Low Risk  (03/11/2022)  Physical Activity: Inactive (03/11/2022)  Social Connections: Moderately Isolated (03/11/2022)  Stress: No Stress Concern Present (03/11/2022)  Tobacco Use: Medium Risk (01/09/2023)     Readmission Risk Interventions    01/11/2023    5:06 PM  Readmission Risk Prevention Plan  Transportation Screening Not Complete  Transportation Screening Comment Unable to speak to wife or patient.  PCP or Specialist Appt within 3-5 Days Complete  HRI or Home Care Consult Complete  Social Work Consult for Recovery Care Planning/Counseling Complete  Palliative Care Screening Complete  Medication  Review Oceanographer) Referral to Pharmacy

## 2023-01-15 NOTE — Progress Notes (Addendum)
Mobility Specialist - Progress Note  Pre-mobility: 87 bpm HR, During mobility: 127 bpm HR,  Post-mobility: 93 bpm HR,    01/15/23 1142  Mobility  Activity Ambulated with assistance in hallway  Level of Assistance Standby assist, set-up cues, supervision of patient - no hands on  Assistive Device Front wheel walker  Distance Ambulated (ft) 350 ft  Range of Motion/Exercises Active  Activity Response Tolerated well  Mobility Referral Yes  $Mobility charge 1 Mobility  Mobility Specialist Start Time (ACUTE ONLY) 1120  Mobility Specialist Stop Time (ACUTE ONLY) 1142  Mobility Specialist Time Calculation (min) (ACUTE ONLY) 22 min   Pt was found on recliner chair and agreeable to ambulate. Grew fatigued with session. At EOS returned to recliner chair with all needs met. Call bell in reach.  Billey Chang Mobility Specialist

## 2023-01-17 ENCOUNTER — Telehealth: Payer: Self-pay

## 2023-01-17 ENCOUNTER — Telehealth: Payer: Self-pay | Admitting: Family Medicine

## 2023-01-17 ENCOUNTER — Ambulatory Visit: Payer: Medicare Other | Admitting: Cardiology

## 2023-01-17 NOTE — Telephone Encounter (Signed)
Spoke with pt and he stated that his daughter help him with some questions about medications that he is taking

## 2023-01-17 NOTE — Discharge Summary (Signed)
Physician Discharge Summary   Patient: [name] MRN: [id number] DOB: 1928-09-01  Admit date:     01/09/2023  Discharge date: 01/15/2023  Discharge Physician: Lynden Oxford  PCP: [name], MD  Recommendations at discharge: Follow-up with cardiology as recommended. Follow-up with PCP in 1 to 2 weeks. Follow-up with orthopedic surgery for acetabular fluid collection and lesion.   Follow-up Information     Joylene Grapes, NP Follow up on 02/03/2023.   Specialties: Cardiology, Family Medicine Why: 8:50AM. Cardiology follow up Contact information: [address] [place] [address] [phone]         [name], MD. Schedule an appointment as soon as possible for a visit in 1 week(s).   Specialty: Family Medicine Contact information: [address] [place] [address] [phone]         Tarry Kos, MD. Schedule an appointment as soon as possible for a visit in 1 month(s).   Specialty: Orthopedic Surgery Contact information: [address] [place] [address] [phone]                Discharge Diagnoses: Principal Problem:   Sepsis due to pneumonia [facility]) Active Problems:   Chronic atrial fibrillation (HCC)   Chronic heart failure with preserved ejection fraction (HFpEF) (HCC)   CKD stage 3a, GFR 45-59 ml/min (HCC)   Hypertension associated with diabetes (HCC)   Diabetes mellitus with diabetic nephropathy, with long-term current use of insulin (HCC)   Multifocal pneumonia   Atherosclerosis of native coronary artery of native heart without angina pectoris   Hyponatremia   Fluid collection at surgical site   Atrial fibrillation with RVR (HCC)   Sepsis without acute organ dysfunction (HCC)   Pelvic fluid collection   Nonrheumatic aortic valve stenosis   Long term (current) use of anticoagulants  Brief hospital course: PMH of HTN, IDDM, CAD status post CABG in 1989 A-fib on Eliquis, chronic  diastolic CHF, presented with fatigue, generalized malaise.  Currently being treated for sepsis due to pneumonia as well as A-fib with RVR. Incidentally was also found to have right periprosthetic fluid collection, which appears chronic, currently being managed conservatively.  Assessment and Plan: Sepsis due to community-acquired pneumonia. Present on admission. Acute hypoxic respiratory failure secondary to pneumonia. Met SIRS criteria on admission with fever tachycardia tachypnea. COVID-negative.  RSV and flu negative. Blood cultures so far negative. Urine strep and Legionella antigen negative as well. Leukocytosis improved earlier.  Later on worsened secondary to steroids. Was on IV ceftriaxone and Zithromax. Chest x-ray ordered by cardiology on 11/13 shows no significant change per my evaluation. No hypoxia for now.  No other symptoms of ongoing infection. Completed oral antibiotics Augmentin and azithromycin.  Permanent A-fib with RVR. Intermittent RVR. Currently on Lopressor. Was on Cardizem in the past for rate control which was stopped due to bradycardic episodes. Given his persistent RVR due to infection initiated on IV Cardizem. On anticoagulation with Eliquis. Highly appreciate cardiology consultation. Now back on Cardizem 120 mg.  Given his tendency for bradycardia and pauses, will allow lenient rate control per cardiology recommendations.  Right periprosthetic fluid collection. Acetabular lesion. Orthopedic consulted. 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 Outpatient follow-up recommended. No intervention.  Acute metabolic encephalopathy. Potentially sundowning.  No metabolic issues identified in the hospital.  Resolved.  Hyponatremia. Resolved.  CAD SP CABG. No angina for now. Monitor.  Type 2 diabetes mellitus, on long-term insulin with nephropathy Hemoglobin A1c 7.3. Now with  steroid-induced hyperglycemia. Blood sugar elevated.   Continue home regimen.  Chronic HFpEF. Volume level adequate for now.  On Lasix. Monitor.  Moderate to severe arctic stenosis. Monitor for now.  CKD 3A. Renal function at baseline. Monitor for now.  Macrocytic anemia. Chronic in nature No active bleeding. Monitor.  Obesity Class 1 Body mass index is 33.09 kg/m.  Placing the pt at higher risk of poor outcomes.   Consultants:  Cardiology  Procedures performed:  None  DISCHARGE MEDICATION: Allergies as of 01/15/2023       Reactions   Septra [sulfamethoxazole-trimethoprim] Itching   Cefadroxil Other (See Comments)   Unknown reaction    Ciprofloxacin Other (See Comments)   Unknown action    Erythromycin Other (See Comments)   Upsets the stomach        Medication List     TAKE these medications    apixaban 5 MG Tabs tablet Commonly known as: ELIQUIS Take 1 tablet (5 mg total) by mouth 2 (two) times daily.   atorvastatin 40 MG tablet Commonly known as: LIPITOR Take 1 tablet (40 mg total) by mouth at bedtime.   Avodart 0.5 MG capsule Generic drug: dutasteride Take 0.5 mg by mouth every other day.   diltiazem 120 MG 24 hr capsule Commonly known as: CARDIZEM CD Take 1 capsule (120 mg total) by mouth daily.   empagliflozin 25 MG Tabs tablet Commonly known as: JARDIANCE Take 1 tablet (25 mg total) by mouth daily before breakfast.   furosemide 40 MG tablet Commonly known as: LASIX TAKE 1 TABLET(40 MG) BY MOUTH DAILY What changed: See the new instructions.   gabapentin 300 MG capsule Commonly known as: NEURONTIN Take 2 capsules (600 mg total) by mouth at bedtime.   insulin glargine 100 UNIT/ML injection Commonly known as: LANTUS Inject 38 Units into the skin every morning.   iron polysaccharides 150 MG capsule Commonly known as: NIFEREX Take 1 capsule (150 mg total) by mouth daily.   meloxicam 7.5 MG tablet Commonly known as: MOBIC Take 7.5 mg by mouth daily as needed.   metFORMIN 500 MG  tablet Commonly known as: GLUCOPHAGE Take 500 mg by mouth 2 (two) times daily with a meal.   methocarbamol 500 MG tablet Commonly known as: ROBAXIN Take 1,000 mg by mouth 3 (three) times daily as needed.   metoprolol tartrate 50 MG tablet Commonly known as: LOPRESSOR Take 1 tablet (50 mg total) by mouth 2 (two) times daily.   pantoprazole 40 MG tablet Commonly known as: PROTONIX TAKE 1 TABLET BY MOUTH DAILY AT NOON What changed: See the new instructions.   tamsulosin 0.4 MG Caps capsule Commonly known as: FLOMAX Take 0.4 mg by mouth daily.   traMADol 50 MG tablet Commonly known as: ULTRAM Take 1 tablet (50 mg total) by mouth 2 (two) times daily as needed.   TYLENOL 500 MG tablet Generic drug: acetaminophen Take 500-1,000 mg by mouth every 6 (six) hours as needed for mild pain or headache.   VITAMIN D3 PO Take 50 mcg by mouth daily.       Disposition: Home Diet recommendation: Cardiac diet  Discharge Exam: Vitals:   01/14/23 2209 01/15/23 0538 01/15/23 0848 01/15/23 1201  BP: (!) 146/64 120/68 (!) 144/87 130/75  Pulse: 82 79 97   Resp:  20 18 18   Temp:  (!) 97.5 F (36.4 C)  (!) 97.5 F (36.4 C)  TempSrc:  Oral  Oral  SpO2:  96% 97% 99%  Weight:  90 kg  Height:       General: Appear in no distress; no visible Abnormal Neck Mass Or lumps, Conjunctiva normal Cardiovascular: S1 and S2 Present, no Murmur, Respiratory: good respiratory effort, Bilateral Air entry present and faint basal crackles, no wheezes Abdomen: Bowel Sound present, Non tender  Extremities: no Pedal edema Neurology: alert and oriented to time, place, and person  Filed Weights   01/13/23 0500 01/14/23 0448 01/15/23 0538  Weight: 89 kg 90.1 kg 90 kg   Condition at discharge: stable  The results of significant diagnostics from this hospitalization (including imaging, microbiology, ancillary and laboratory) are listed below for reference.   Imaging Studies: DG CHEST PORT 1 VIEW  Result  Date: 01/12/2023 CLINICAL DATA:  Leukocytosis. EXAM: PORTABLE CHEST 1 VIEW COMPARISON:  January 09, 2023. FINDINGS: Stable cardiomegaly. Increased left lingular and basilar opacity is noted concerning for pneumonia or atelectasis with possible associated effusion. Minimal right basilar subsegmental atelectasis is noted. Sternotomy wires are noted. IMPRESSION: Increased left basilar opacity as noted above. Minimal right basilar subsegmental atelectasis. Aortic Atherosclerosis (ICD10-I70.0). Electronically Signed   By: Lupita Raider M.D.   On: 01/12/2023 13:23   CT HEAD WO CONTRAST ( )  Result Date: 01/11/2023 CLINICAL DATA:  Provided history: Mental status change, unknown cause. EXAM: CT HEAD WITHOUT CONTRAST TECHNIQUE: Contiguous axial images were obtained from the base of the skull through the vertex without intravenous contrast. RADIATION DOSE REDUCTION: This exam was performed according to the departmental dose-optimization program which includes automated exposure control, adjustment of the mA and/or kV according to patient size and/or use of iterative reconstruction technique. COMPARISON:  Head CT 10/04/2021.  Brain MRI 06/06/2016. FINDINGS: Brain: Generalized cerebral atrophy. Patchy and ill-defined hypoattenuation within the cerebral white matter, nonspecific but compatible with mild chronic small vessel ischemic disease. There is no acute intracranial hemorrhage. No demarcated cortical infarct. No extra-axial fluid collection. No evidence of an intracranial mass. No midline shift. Vascular: No hyperdense vessel.  Atherosclerotic calcifications. Skull: No calvarial fracture or aggressive osseous lesion. Sinuses/Orbits: No mass or acute finding within the imaged orbits. Trace mucosal thickening within the bilateral maxillary sinuses at the imaged levels. IMPRESSION: 1. No evidence of an acute intracranial abnormality. 2. Parenchymal atrophy and chronic small vessel ischemic disease. Electronically  Signed   By: Jackey Loge D.O.   On: 01/11/2023 15:58   MR HIP RIGHT W WO CONTRAST  Result Date: 01/10/2023 CLINICAL DATA:  Right hip periprosthetic fluid collection. Denies significant hip pain. EXAM: MRI OF THE RIGHT HIP WITHOUT AND WITH CONTRAST TECHNIQUE: Multiplanar, multisequence MR imaging was performed both before and after administration of intravenous contrast. CONTRAST:  6mL GADAVIST GADOBUTROL 1 MMOL/ML IV SOLN COMPARISON:  CT abdomen pelvis from yesterday. CT abdomen pelvis dated April 21, 2022, and November 28, 2010. FINDINGS: Bones: Prior right total hip arthroplasty with associated susceptibility artifact. Expansile mixed signal intensity replacing normal marrow in the right greater trochanter and right anterior and posterior acetabulum, with associated cortical breakthrough. Large thick-walled T2 and T1 hyperintense fluid collection with hypointense rim surrounding the right hip arthroplasty, measuring approximately 7.7 x 8.3 x 14.0 cm (AP by transverse by CC). There is no evidence of acute fracture, dislocation or avascular necrosis. The visualized sacroiliac joints and symphysis pubis appear normal. Articular cartilage and labrum Articular cartilage: Areas of high-grade partial-thickness cartilage loss in the superior left hip joint with subchondral cystic change in the acetabulum. Labrum: Small paralabral cyst along the superior left labrum, suspicious for underlying labral tear. Joint or  bursal effusion Joint effusion: No significant left hip joint effusion. Bursae: Small amount of fluid in the right iliopsoas bursa. Muscles and tendons Muscles and tendons: The visualized gluteus, hamstring and iliopsoas tendons appear intact. No muscle edema. Right-greater-than-left gluteus minimus and medius muscle atrophy. Asymmetric atrophy of the right piriformis muscle. Other findings Miscellaneous: Unchanged moderate prostatomegaly. IMPRESSION: 1. Prior right total hip arthroplasty with large  periprosthetic fluid collection measuring up to 14.0 cm, concerning for particle disease. 2. Expansile mixed signal intensity replacing normal marrow in the right greater trochanter and right anterior and posterior acetabulum with associated cortical breakthrough, also consistent with particle disease. 3. Mild right iliopsoas bursitis. 4. Mild to moderate left hip osteoarthritis. Electronically Signed   By: Obie Dredge M.D.   On: 01/10/2023 12:01   DG Chest Port 1 View  Result Date: 01/09/2023 CLINICAL DATA:  Questionable sepsis, weakness and feeling sick, shortness of breath EXAM: PORTABLE CHEST 1 VIEW COMPARISON:  11/30/2022 FINDINGS: Hazy airspace and interstitial opacities in the left mid and lower lung compatible with pneumonia. No pneumothorax. Cardiomegaly and pulmonary vascular congestion. Sternotomy and CABG. Aortic atherosclerotic calcification. IMPRESSION: 1. Left mid and lower lung pneumonia. Follow-up in 6-8 weeks after treatment is recommended to ensure resolution. 2. Cardiomegaly and pulmonary vascular congestion. Electronically Signed   By: Minerva Fester M.D.   On: 01/09/2023 19:31   CT ABDOMEN PELVIS W CONTRAST  Result Date: 01/09/2023 CLINICAL DATA:  Right lower quadrant abdominal pain. Weakness and feeling sick. EXAM: CT ABDOMEN AND PELVIS WITH CONTRAST TECHNIQUE: Multidetector CT imaging of the abdomen and pelvis was performed using the standard protocol following bolus administration of intravenous contrast. RADIATION DOSE REDUCTION: This exam was performed according to the departmental dose-optimization program which includes automated exposure control, adjustment of the mA and/or kV according to patient size and/or use of iterative reconstruction technique. CONTRAST:  OMNIPAQUE IOHEXOL 300 MG/ML  SOLN COMPARISON:  CT abdomen and pelvis 04/21/2022 FINDINGS: Lower chest: Partially visualized consolidation in the left lower lobe. Small left pleural effusion. Right basilar  atelectasis or infiltrates. Hepatobiliary: Cholelithiasis without evidence of acute cholecystitis. Unremarkable liver and biliary tree. Pancreas: Unremarkable. Spleen: Unremarkable. Adrenals/Urinary Tract: Normal adrenal glands. 2 mm nonobstructing right nephrolithiasis. No obstructing ureteral calculi or hydronephrosis. Unremarkable bladder. Stomach/Bowel: Normal caliber large and small bowel. No bowel wall thickening. Appendix is normal. Small hiatal hernia. Vascular/Lymphatic: Advanced aortic atherosclerotic calcification. No lymphadenopathy. Reproductive: Prostatomegaly. Other: No free intraperitoneal fluid or air. Musculoskeletal: Right THA. Expansile lesion with cortical breakthrough in the posterior acetabulum measuring 6.2 x 4.3 cm. Large periprosthetic fluid collection is partially obscured by streak artifact from the right THA and extends below the inferior aspect of the exam. This measures approximately 8.8 x 5.6 cm in the axial plane. IMPRESSION: 1. Pulmonary findings compatible with multifocal pneumonia. 2. Large periprosthetic fluid collection is partially obscured by streak artifact from the right THA and extends below the inferior aspect of the exam. This measures approximately 8.8 x 5.6 cm in the axial plane. 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 If there is concern for infection, fluid sampling is recommended. 3. Unchanged expansile lesion with cortical breakthrough in the posterior acetabulum measuring 6.2 x 4.3 cm. Consider MRI for further evaluation 4. Cholelithiasis without evidence of acute cholecystitis. 5. 2 mm nonobstructing right nephrolithiasis. 6. Enlarged prostate. Aortic Atherosclerosis (ICD10-I70.0). Electronically Signed   By: Minerva Fester M.D.   On: 01/09/2023 19:29    Microbiology: Results for orders placed or performed during the hospital encounter  of 01/09/23  Resp panel by RT-PCR (RSV, Flu A&B, Covid) Anterior Nasal Swab     Status: None   Collection Time:  01/09/23  5:10 PM   Specimen: Anterior Nasal Swab  Result Value Ref Range Status   SARS Coronavirus 2 by RT PCR NEGATIVE NEGATIVE Final    Comment: (NOTE) SARS-CoV-2 target nucleic acids are NOT DETECTED.  The SARS-CoV-2 RNA is generally detectable in upper respiratory specimens during the acute phase of infection. The lowest concentration of SARS-CoV-2 viral copies this assay can detect is 138 copies/mL. A negative result does not preclude SARS-Cov-2 infection and should not be used as the sole basis for treatment or other patient management decisions. A negative result may occur with  improper specimen collection/handling, submission of specimen other than nasopharyngeal swab, presence of viral mutation(s) within the areas targeted by this assay, and inadequate number of viral copies(<138 copies/mL). A negative result must be combined with clinical observations, patient history, and epidemiological information. The expected result is Negative.  Fact Sheet for Patients:  BloggerCourse.com  Fact Sheet for Healthcare Providers:  SeriousBroker.it  This test is no t yet approved or cleared by the [place] FDA and  has been authorized for detection and/or diagnosis of SARS-CoV-2 by FDA under an Emergency Use Authorization (EUA). This EUA will remain  in effect (meaning this test can be used) for the duration of the COVID-19 declaration under Section 564(b)(1) of the Act, 21 U.S.C.section 360bbb-3(b)(1), unless the authorization is terminated  or revoked sooner.       Influenza A by PCR NEGATIVE NEGATIVE Final   Influenza B by PCR NEGATIVE NEGATIVE Final    Comment: (NOTE) The Xpert Xpress SARS-CoV-2/FLU/RSV plus assay is intended as an aid in the diagnosis of influenza from Nasopharyngeal swab specimens and should not be used as a sole basis for treatment. Nasal washings and aspirates are unacceptable for Xpert Xpress  SARS-CoV-2/FLU/RSV testing.  Fact Sheet for Patients: BloggerCourse.com  Fact Sheet for Healthcare Providers: SeriousBroker.it  This test is not yet approved or cleared by the [place] FDA and has been authorized for detection and/or diagnosis of SARS-CoV-2 by FDA under an Emergency Use Authorization (EUA). This EUA will remain in effect (meaning this test can be used) for the duration of the COVID-19 declaration under Section 564(b)(1) of the Act, 21 U.S.C. section 360bbb-3(b)(1), unless the authorization is terminated or revoked.     Resp Syncytial Virus by PCR NEGATIVE NEGATIVE Final    Comment: (NOTE) Fact Sheet for Patients: BloggerCourse.com  Fact Sheet for Healthcare Providers: SeriousBroker.it  This test is not yet approved or cleared by the [place] FDA and has been authorized for detection and/or diagnosis of SARS-CoV-2 by FDA under an Emergency Use Authorization (EUA). This EUA will remain in effect (meaning this test can be used) for the duration of the COVID-19 declaration under Section 564(b)(1) of the Act, 21 U.S.C. section 360bbb-3(b)(1), unless the authorization is terminated or revoked.  Performed at [facility], [address] [place], [place] [address]   Blood Culture (routine x 2)     Status: None   Collection Time: 01/09/23  5:10 PM   Specimen: BLOOD  Result Value Ref Range Status   Specimen Description   Final    BLOOD BLOOD LEFT ARM Performed at [facility], [address] [place], [place] [address]    Special Requests   Final    BOTTLES DRAWN AEROBIC AND ANAEROBIC Blood Culture adequate volume  Performed at [facility], [address] [place], [place] [address]    Culture   Final    NO GROWTH 5 DAYS Performed at [facility], [address] [place], [place]  [address]    Report Status 01/14/2023 FINAL  Final  Blood Culture (routine x 2)     Status: None   Collection Time: 01/09/23  5:20 PM   Specimen: BLOOD  Result Value Ref Range Status   Specimen Description   Final    BLOOD BLOOD RIGHT ARM Performed at [facility], [address] [place], [place] [address]    Special Requests   Final    BOTTLES DRAWN AEROBIC AND ANAEROBIC Blood Culture results may not be optimal due to an inadequate volume of blood received in culture bottles Performed at [facility], [address], [place], [place] [address]    Culture   Final    NO GROWTH 5 DAYS Performed at [facility], [address], [place], [place] [address]    Report Status 01/14/2023 FINAL  Final  MRSA Next Gen by PCR, Nasal     Status: None   Collection Time: 01/09/23 10:08 PM   Specimen: Nasal Mucosa; Nasal Swab  Result Value Ref Range Status   MRSA by PCR Next Gen NOT DETECTED NOT DETECTED Final    Comment: (NOTE) The GeneXpert MRSA Assay (FDA approved for NASAL specimens only), is one component of a comprehensive MRSA colonization surveillance program. It is not intended to diagnose MRSA infection nor to guide or monitor treatment for MRSA infections. Test performance is not FDA approved in patients less than 51 years old. Performed at [facility], [address], [place], [place] [address]    Labs: CBC: Recent Labs  Lab 01/12/23 0254 01/12/23 1045 01/13/23 0611 01/14/23 0501 01/15/23 0621  WBC 20.1* 19.3* 16.3* 15.5* 24.7*  NEUTROABS  --  15.7*  --   --   --   HGB 9.2* 9.9* 8.6* 9.9* 9.2*  HCT 30.4* 34.2* 29.3* 32.0* 30.1*  MCV 74.0* 76.9* 74.7* 72.4* 74.5*  PLT 246 227 286 212 467*   Basic Metabolic Panel: Recent Labs  Lab 01/11/23 0255 01/12/23 0254 01/13/23 0611 01/14/23 0501 01/15/23 0621  NA 133* 134* 133* 133* 133*  K 3.2* 3.5 3.6 4.7 3.9  CL 98 102 99 102 101  CO2 21* 22 23 20* 23   GLUCOSE 165* 184* 218* 294* 263*  BUN 24* 23 22 26* 33*  CREATININE 1.19 0.97 1.15 0.74 1.12  CALCIUM 8.8* 8.7* 8.5* 8.8* 8.7*  MG  --   --  2.1 2.1 2.2   Liver Function Tests: No results for input(s): "AST", "ALT", "ALKPHOS", "BILITOT", "PROT", "ALBUMIN" in the last 168 hours. CBG: Recent Labs  Lab 01/14/23 1123 01/14/23 1543 01/14/23 2108 01/15/23 0741 01/15/23 1204  GLUCAP 313* 385* 336* 246* 207*    Discharge time spent: greater than 30 minutes.  Author: Lynden Oxford, MD  [facility] 01/15/2023

## 2023-01-17 NOTE — Transitions of Care (Post Inpatient/ED Visit) (Signed)
01/17/2023  Name: [name] MRN: [id number] DOB: [birth year]  Today's TOC FU Call Status: Today's TOC FU Call Status:: Successful TOC FU Call Completed TOC FU Call Complete Date: 01/17/23 Patient's Name and Date of Birth confirmed.  Transition Care Management Follow-up Telephone Call Date of Discharge: 01/15/23 Discharge Facility: [facility]) Type of Discharge: Inpatient Admission Primary Inpatient Discharge Diagnosis:: "mulitfocal PNA" How have you been since you were released from the hospital?: Better (pt voices he is doing well-hes has been getting good slept at night, denies nay SOB or resp sxs, appetitie good, "blood sugars back to normal now " per pt-cbg 110 this AM-fasting, no issues with elmination, he is up walking/moving aorund w/ walker) Any questions or concerns?: Yes Patient Questions/Concerns:: pt voices he has meds(Flomax, Mobic & Robaxin) on his d/c papers that he is not familiar with and does not know what they are for and why he should be taking them Patient Questions/Concerns Addressed: Other:, Notified Provider of Patient Questions/Concerns (Med review completed with pt-he is taking meds as ordered, he is out of Flomax-no refills-he will call pharmacy for refill request to be sent to prescriber, he is aware that Robaxin and Mobic are as needed-has not had to take any per pt-ordered by ED MD)  Items Reviewed: Did you receive and understand the discharge instructions provided?: Yes Medications obtained,verified, and reconciled?: Yes (Medications Reviewed) Any new allergies since your discharge?: No Dietary orders reviewed?: Yes Type of Diet Ordered:: low salt/heart healthy Do you have support at home?: Yes People in Home: spouse Name of Support/Comfort Primary Source: Ann  Medications Reviewed Today: Medications Reviewed Today     Reviewed by Charlyn Minerva, RN (Registered Nurse) on 01/17/23 at 1043  Med List Status: <None>    Medication Order Taking? Sig Documenting Provider Last Dose Status Informant  apixaban (ELIQUIS) 5 MG TABS tablet [id number] Yes Take 1 tablet (5 mg total) by mouth 2 (two) times daily. Marguerita Merles [place], DO Taking Active Self, Pharmacy Records  atorvastatin (LIPITOR) 40 MG tablet [id number] Yes Take 1 tablet (40 mg total) by mouth at bedtime. [name], MD Taking Active Self, Pharmacy Records  AVODART 0.5 MG capsule [id number] Yes Take 0.5 mg by mouth every other day. [provider] Taking Active Self, Pharmacy Records  Cholecalciferol (VITAMIN D3 PO) [id number] Yes Take 50 mcg by mouth daily. [provider] Taking Active Self, Pharmacy Records  diltiazem (CARDIZEM CD) 120 MG 24 hr capsule [id number] Yes Take 1 capsule (120 mg total) by mouth daily. Rolly Salter, MD Taking Active   empagliflozin (JARDIANCE) 25 MG TABS tablet [id number] Yes Take 1 tablet (25 mg total) by mouth daily before breakfast. [place], Peter M, MD Taking Active Self, Pharmacy Records  furosemide (LASIX) 40 MG tablet [id number] Yes TAKE 1 TABLET(40 MG) BY MOUTH DAILY  Patient taking differently: Take 40 mg by mouth in the morning.   [place], Peter M, MD Taking Active Self, Pharmacy Records  gabapentin (NEURONTIN) 300 MG capsule [id number] Yes Take 2 capsules (600 mg total) by mouth at bedtime. [name], MD Taking Active Self, Pharmacy Records  insulin glargine (LANTUS) 100 UNIT/ML injection [id number] Yes Inject 38 Units into the skin every morning. [provider] Taking Active Self, Pharmacy Records  iron polysaccharides (NIFEREX) 150 MG capsule [id number] Yes Take 1 capsule (150 mg total) by mouth daily. Marguerita Merles [place], DO Taking Active Self, Pharmacy Records  meloxicam (MOBIC) 7.5 MG tablet [id number] No Take 7.5 mg  by mouth daily as needed.  Patient not taking: Reported on 01/17/2023   [provider] Not Taking Active Self, Pharmacy Records  metFORMIN (GLUCOPHAGE) 500 MG  tablet [id number] Yes Take 500 mg by mouth 2 (two) times daily with a meal. [provider] Taking Active Self, Pharmacy Records  methocarbamol (ROBAXIN) 500 MG tablet [id number] No Take 1,000 mg by mouth 3 (three) times daily as needed.  Patient not taking: Reported on 01/17/2023   [provider] Not Taking Active Self, Pharmacy Records  metoprolol tartrate (LOPRESSOR) 50 MG tablet [id number] Yes Take 1 tablet (50 mg total) by mouth 2 (two) times daily. [place], Peter M, MD Taking Active Self, Pharmacy Records  pantoprazole (PROTONIX) 40 MG tablet [id number] Yes TAKE 1 TABLET BY MOUTH DAILY AT NOON  Patient taking differently: Take 40 mg by mouth daily at 12 noon.   Joylene Grapes, NP Taking Active Self, Pharmacy Records  tamsulosin [facility]) 0.4 MG CAPS capsule [id number]  Take 0.4 mg by mouth daily. [provider]  Active Self, Pharmacy Records           Med Note Modena Jansky Jan 17, 2023 10:36 AM) Pt states out of med-will call pharmacy to request refill  traMADol (ULTRAM) 50 MG tablet [id number] No Take 1 tablet (50 mg total) by mouth 2 (two) times daily as needed.  Patient not taking: Reported on 01/17/2023   [name], MD Not Taking Active Self, Pharmacy Records  TYLENOL 500 MG tablet [id number] Yes Take 500-1,000 mg by mouth every 6 (six) hours as needed for mild pain or headache. [provider] Taking Active Self, Pharmacy Records            Home Care and Equipment/Supplies: Were Home Health Services Ordered?: NA Any new equipment or medical supplies ordered?: NA  Functional Questionnaire: Do you need assistance with bathing/showering or dressing?: No Do you need assistance with meal preparation?: No Do you need assistance with eating?: No Do you have difficulty maintaining continence: No Do you need assistance with getting out of bed/getting out of a chair/moving?: No Do you have difficulty managing or taking your  medications?: No  Follow up appointments reviewed: PCP Follow-up appointment confirmed?: No (Pt declined making appt during call-states he will call office later on his own to schedule appt) MD Provider Line Number:[phone] Given: No Specialist Hospital Follow-up appointment confirmed?: Yes Date of Specialist follow-up appointment?: 02/03/23 Follow-Up Specialty Provider:: Irving Burton [facility]-cardiology, pt aware to call ortho office and make f/u appt as per d/c instructions Do you need transportation to your follow-up appointment?: No (pt states he drives hismelf to appts, extended family lives in [place], he declined needing transportation resoucresc/assistance, encouraged pt to look into insuracne benefits) Do you understand care options if your condition(s) worsen?: Yes-patient verbalized understanding  SDOH Interventions Today    Flowsheet Row Most Recent Value  SDOH Interventions   Food Insecurity Interventions Intervention Not Indicated  Housing Interventions Intervention Not Indicated  Transportation Interventions Intervention Not Indicated  [pt declined transportation resources, he drives himself to appts]  Utilities Interventions Intervention Not Indicated        Goals Addressed             This Visit's Progress    TOC Care Plan       Current Barriers:  Chronic Disease Management support and education needs related to CHF and DMII   RNCM Clinical Goal(s):  Patient will work with the Care Management team  over the next 30 days to address Transition of Care Barriers: chronic disease mgmt take all medications exactly as prescribed and will call provider for medication related questions as evidenced by med adherence/compliance, calling pharmacy for refills as needed attend all scheduled medical appointments:   as evidenced by completion of PCP, ortho and cardiology appts demonstrate Improved health management independence as evidenced by lowering of A1C  through collaboration  with RN Care manager, provider, and care team.   Interventions: Evaluation of current treatment plan related to  self management and patient's adherence to plan as established by provider   Heart Failure Interventions:  (Status:  New goal.) Short Term Goal Provided education on low sodium diet Reviewed role of diuretics in prevention of fluid overload and management of heart failure; Discussed the importance of keeping all appointments with provider Assessed social determinant of health barriers   Diabetes Interventions:  (Status:  New goal.) Short Term Goal Assessed patient's understanding of A1c goal: <7% Provided education to patient about basic DM disease process Reviewed medications with patient and discussed importance of medication adherence Counseled on importance of regular laboratory monitoring as prescribed Discussed plans with patient for ongoing care management follow up and provided patient with direct contact information for care management team Lab Results  Component Value Date   HGBA1C 7.3 (A) 09/30/2022    Patient Goals/Self-Care Activities: Participate in Transition of Care Program/Attend TOC scheduled calls Take all medications as prescribed Attend all scheduled provider appointments Perform all self care activities independently  Call provider office for new concerns or questions  use salt in moderation Maintain safety in the home-report no falls/injuries check blood sugar at prescribed times: once daily take the blood sugar log to all doctor visits  Follow Up Plan:  Telephone follow up appointment with care management team member scheduled for:  01/24/23-11am The patient has been provided with contact information for the care management team and has been advised to call with any health related questions or concerns.           Antionette Fairy, RN,BSN,CCM RN Care Manager Transitions of Care  [facility]  Direct Phone:  [phone] Toll Free: [phone] Fax: [phone]

## 2023-01-17 NOTE — Telephone Encounter (Signed)
Patient states he has questions about his medication. Patient was recently discharged from hospital 11/16. I offered a hospital f/u OV but pt declined for now stating he is unable to drive. States anyone else able to drive him is in [place]. Patient states once he is able to come in he will schedule an OV. Patient requests to speak to someone clinical. Please Advise.

## 2023-01-24 ENCOUNTER — Encounter: Payer: Medicare Other | Admitting: Pharmacist

## 2023-01-24 ENCOUNTER — Other Ambulatory Visit: Payer: Self-pay

## 2023-01-24 NOTE — Patient Instructions (Signed)
Visit Information  Thank you for taking time to visit with me today. Please don't hesitate to contact me if I can be of assistance to you before our next scheduled telephone appointment.  Our next appointment is by telephone on 01/31/23 at 1pm  Following is a copy of your care plan:   Goals Addressed             This Visit's Progress    TOC Care Plan       Current Barriers:  Chronic Disease Management support and education needs related to CHF and DMII   RNCM Clinical Goal(s):  Patient will work with the Care Management team over the next 30 days to address Transition of Care Barriers: chronic disease mgmt take all medications exactly as prescribed and will call provider for medication related questions as evidenced by med adherence/compliance, calling pharmacy for refills as needed attend all scheduled medical appointments:   as evidenced by completion of PCP, ortho and cardiology appts demonstrate Improved health management independence as evidenced by lowering of A1C  through collaboration with RN Care manager, provider, and care team.   Interventions: Evaluation of current treatment plan related to  self management and patient's adherence to plan as established by provider   Heart Failure Interventions:  (Status:  Goal on track:  Yes.) Short Term Goal Assessed for any changes in condition- pt pleased to report he has had a good week-feeling better and stronger-has been abel to get outside and walk around the house some Discussed and reviewed f/u appts- pt has cardiology appt on 02/03/23 Care guide assisted with making PCP appt as pt had forgotten to call office to make appt- appt with provider made for 02/07/23-10:30am  Diabetes Interventions:  (Status:  Goal on track:  Yes.) Short Term Goal Assessed patient's understanding of A1c goal: <7% Provided education to patient about basic DM disease process Reviewed medications with patient and discussed importance of medication  adherence Counseled on importance of regular laboratory monitoring as prescribed Discussed plans with patient for ongoing care management follow up and provided patient with direct contact information for care management team Lab Results  Component Value Date   HGBA1C 7.3 (A) 09/30/2022  -Nutrition assessment done-pt voices good appetite- having some mild constipation-no BM x2 days-has been taking Miralax-for past few days-will try some prunes or prune juice  Patient Goals/Self-Care Activities: Participate in Transition of Care Program/Attend TOC scheduled calls Take all medications as prescribed Attend all scheduled provider appointments Perform all self care activities independently  Call provider office for new concerns or questions  use salt in moderation Maintain safety in the home-report no falls/injuries check blood sugar at prescribed times: once daily take the blood sugar log to all doctor visits  Follow Up Plan:  Telephone follow up appointment with care management team member scheduled for:  12/02//24-1pm The patient has been provided with contact information for the care management team and has been advised to call with any health related questions or concerns.          Patient verbalizes understanding of instructions and care plan provided today and agrees to view in MyChart. Active MyChart status and patient understanding of how to access instructions and care plan via MyChart confirmed with patient.     The patient has been provided with contact information for the care management team and has been advised to call with any health related questions or concerns.   Please call the care guide team at [phone] if you need  to cancel or reschedule your appointment.   Please call the [place] National Suicide Prevention Lifeline: 6304009724 or TTY: 702-722-3935 TTY 412-153-6141) to talk to a trained counselor call 1-800-273-TALK (toll free, 24 hour hotline) if you are  experiencing a Mental Health or Behavioral Health Crisis or need someone to talk to.  Antionette Fairy, RN,BSN,CCM RN Care Manager Transitions of Care  [facility]  Direct Phone: [phone] Toll Free: [phone] Fax: [phone]

## 2023-01-24 NOTE — Patient Outreach (Signed)
Care Management  Transitions of Care Program Transitions of Care Post-discharge week 2   01/24/2023 Name: [name] MRN: [id number] DOB: [birth year]  Subjective: [name] is a 89 y.o. year old male who is a primary care patient of Jeffrey Ora, MD. The Care Management team Engaged with patient by telephone to assess and address transitions of care needs.   Consent to Services:  Patient was given information about care management services, agreed to services, and gave verbal consent to participate.   Assessment:   Pt pleased to reports how well things have been going so far. Denies any SOB, coughing, swelling or other sxs.He has been more mobile-active-walking outside the home. He plans to go run some errands with wife tomorrow. Reviewed upcoming appts. Pt declined making ortho appt as he voices "hip not bothering me." Denies any RN CM needs or concerns at this time.        SDOH Interventions    Flowsheet Row Telephone from 01/17/2023 in [facility] POPULATION HEALTH DEPARTMENT Patient Outreach from 10/14/2022 in [facility] POPULATION HEALTH DEPARTMENT Clinical Support from 03/11/2022 in [facility] Chronic Care Management from 02/26/2022 in [facility] Coordination from 10/19/2021 in [facility] Care Coordination Patient Outreach Telephone from 10/08/2021 in [facility] Care Coordination  SDOH Interventions        Food Insecurity Interventions Intervention Not Indicated Intervention Not Indicated Intervention Not Indicated Intervention Not Indicated Intervention Not Indicated Intervention Not Indicated  Housing Interventions Intervention Not Indicated -- Intervention Not Indicated Intervention Not Indicated Intervention Not Indicated --  Transportation Interventions Intervention Not Indicated  [pt declined transportation resources, he drives himself to appts]  Intervention Not Indicated Intervention Not Indicated Intervention Not Indicated Intervention Not Indicated Other (Comment)  [needs transportation for post hospital visit referral to transportation]  Utilities Interventions Intervention Not Indicated Intervention Not Indicated Intervention Not Indicated Intervention Not Indicated -- --  Financial Strain Interventions -- -- Intervention Not Indicated Intervention Not Indicated -- --  Physical Activity Interventions -- -- Intervention Not Indicated Patient Refused -- --  Stress Interventions -- -- Intervention Not Indicated Intervention Not Indicated -- --  Social Connections Interventions -- -- Intervention Not Indicated Intervention Not Indicated, Patient Refused -- --        Goals Addressed             This Visit's Progress    TOC Care Plan       Current Barriers:  Chronic Disease Management support and education needs related to CHF and DMII   RNCM Clinical Goal(s):  Patient will work with the Care Management team over the next 30 days to address Transition of Care Barriers: chronic disease mgmt take all medications exactly as prescribed and will call provider for medication related questions as evidenced by med adherence/compliance, calling pharmacy for refills as needed attend all scheduled medical appointments:   as evidenced by completion of PCP, ortho and cardiology appts demonstrate Improved health management independence as evidenced by lowering of A1C  through collaboration with RN Care manager, provider, and care team.   Interventions: Evaluation of current treatment plan related to  self management and patient's adherence to plan as established by provider   Heart Failure Interventions:  (Status:  Goal on track:  Yes.) Short Term Goal Assessed for any changes in condition- pt pleased to report he has had a good week-feeling better and stronger-has been abel to get outside and walk around the  house some Discussed and  reviewed f/u appts- pt has cardiology appt on 02/03/23 Care guide assisted with making PCP appt as pt had forgotten to call office to make appt- appt with provider made for 02/07/23-10:30am  Diabetes Interventions:  (Status:  Goal on track:  Yes.) Short Term Goal Assessed patient's understanding of A1c goal: <7% Provided education to patient about basic DM disease process Reviewed medications with patient and discussed importance of medication adherence Counseled on importance of regular laboratory monitoring as prescribed Discussed plans with patient for ongoing care management follow up and provided patient with direct contact information for care management team Lab Results  Component Value Date   HGBA1C 7.3 (A) 09/30/2022  -Nutrition assessment done-pt voices good appetite- having some mild constipation-no BM x2 days-has been taking Miralax-for past few days-will try some prunes or prune juice  Patient Goals/Self-Care Activities: Participate in Transition of Care Program/Attend TOC scheduled calls Take all medications as prescribed Attend all scheduled provider appointments Perform all self care activities independently  Call provider office for new concerns or questions  use salt in moderation Maintain safety in the home-report no falls/injuries check blood sugar at prescribed times: once daily take the blood sugar log to all doctor visits  Follow Up Plan:  Telephone follow up appointment with care management team member scheduled for:  12/02//24-1pm The patient has been provided with contact information for the care management team and has been advised to call with any health related questions or concerns.          Plan: The patient has been provided with contact information for the care management team and has been advised to call with any health related questions or concerns.  Follow up appt scheduled with pt for 01/31/23-1pm.   Antionette Fairy, RN,BSN,CCM RN Care  Manager Transitions of Care  [place]  Direct Phone: [phone] Toll Free: [phone] Fax: [phone]

## 2023-01-31 ENCOUNTER — Other Ambulatory Visit: Payer: Self-pay

## 2023-01-31 NOTE — Patient Outreach (Signed)
Care Management  Transitions of Care Program Transitions of Care Post-discharge week 3   01/31/2023 Name: [name] MRN: [id number] DOB: 1928/06/23  Subjective: [name] is a 89 y.o. year old male who is a primary care patient of [name], MD. The Care Management team  Engaged with patient by telephone to assess and address transitions of care needs.   Consent to Services:  Patient was given information about care management services, agreed to services, and gave verbal consent to participate.   Assessment:   Pt doing well-has had a good week-celebrated holiday with family. Pt voices he has been walking more around the inside of home sine weather is getting too cold for him to walk/exercise outdoors. Appetite remains good and cbgs controlled lately. Denies any RN CM needs or concerns.         SDOH Interventions    Flowsheet Row Telephone from 01/17/2023 in [place] [facility] Patient Outreach from 10/14/2022 in [place] [facility] Clinical Support from 03/11/2022 in [facility] from 02/26/2022 in [facility] Coordination from 10/19/2021 in [facility] Care Coordination Patient Outreach Telephone from 10/08/2021 in [facility] Care Coordination  SDOH Interventions        Food Insecurity Interventions Intervention Not Indicated Intervention Not Indicated Intervention Not Indicated Intervention Not Indicated Intervention Not Indicated Intervention Not Indicated  Housing Interventions Intervention Not Indicated -- Intervention Not Indicated Intervention Not Indicated Intervention Not Indicated --  Transportation Interventions Intervention Not Indicated  [pt declined transportation resources, he drives himself to appts] Intervention Not Indicated Intervention Not Indicated  Intervention Not Indicated Intervention Not Indicated Other (Comment)  [needs transportation for post hospital visit referral to transportation]  Utilities Interventions Intervention Not Indicated Intervention Not Indicated Intervention Not Indicated Intervention Not Indicated -- --  Financial Strain Interventions -- -- Intervention Not Indicated Intervention Not Indicated -- --  Physical Activity Interventions -- -- Intervention Not Indicated Patient Refused -- --  Stress Interventions -- -- Intervention Not Indicated Intervention Not Indicated -- --  Social Connections Interventions -- -- Intervention Not Indicated Intervention Not Indicated, Patient Refused -- --        Goals Addressed             This Visit's Progress    TOC Care Plan       Current Barriers:  Chronic Disease Management support and education needs related to CHF and DMII   RNCM Clinical Goal(s):  Patient will work with the Care Management team over the next 30 days to address Transition of Care Barriers: chronic disease mgmt take all medications exactly as prescribed and will call provider for medication related questions as evidenced by med adherence/compliance, calling pharmacy for refills as needed attend all scheduled medical appointments:   as evidenced by completion of PCP, ortho and cardiology appts demonstrate Improved health management independence as evidenced by lowering of A1C  through collaboration with RN Care manager, provider, and care team.   Interventions: Evaluation of current treatment plan related to  self management and patient's adherence to plan as established by provider   Heart Failure Interventions:  (Status:  Goal on track:  Yes.) Short Term Goal Assessed for any changes in condition- pt voices he is doing well-just got back from running errands with wife, had a good holiday with family, feels like his "PNA has completely cleared up'-no  resp sxs Discussed with pt his family's wishes for  him to get ome in-home support to help him 7 his wife out- pt admits that he does to really like the idea as he does not feel like they need it-however-his children contacted the [place] for services- was told he qualifies for 9 hrs of in home assistance per week he is"going to give it a try" Reviewed with pt upcoming appts- PCP 02/07/23 and cardiology on 02/03/23- pt confirms he is able to get to appts  Diabetes Interventions:  (Status:  Goal on track:  Yes.) Short Term Goal Assessed patient's understanding of A1c goal: <7% Provided education to patient about basic DM disease process Reviewed medications with patient and discussed importance of medication adherence Counseled on importance of regular laboratory monitoring as prescribed Discussed plans with patient for ongoing care management follow up and provided patient with direct contact information for care management team Lab Results  Component Value Date   HGBA1C 7.3 (A) 09/30/2022  -Nutrition assessment done-pt continues to voice good appetite -Reviewed cbg log-cbgs remain controlled  Patient Goals/Self-Care Activities: Participate in Transition of Care Program/Attend TOC scheduled calls Take all medications as prescribed Attend all scheduled provider appointments Perform all self care activities independently  Call provider office for new concerns or questions  use salt in moderation Maintain safety in the home-report no falls/injuries check blood sugar at prescribed times: once daily take the blood sugar log to all doctor visits  Follow Up Plan:  Telephone follow up appointment with care management team member scheduled for:  02/07/23-1pm The patient has been provided with contact information for the care management team and has been advised to call with any health related questions or concerns.          Plan: The patient has been provided with contact information for the care management team and has been advised to call with any  health related questions or concerns.  Follow up appt scheduled with pt for 02/07/23-1pm.  Antionette Fairy, RN,BSN,CCM RN Care Manager Transitions of Care  [place]Population [facility]  Direct Phone: [phone] Toll Free: [phone] Fax: [phone]

## 2023-01-31 NOTE — Patient Instructions (Signed)
Visit Information  Thank you for taking time to visit with me today. Please don't hesitate to contact me if I can be of assistance to you before our next scheduled telephone appointment.  Our next appointment is by telephone on 02/07/23 at 1pm  Following is a copy of your care plan:   Goals Addressed             This Visit's Progress    TOC Care Plan       Current Barriers:  Chronic Disease Management support and education needs related to CHF and DMII   RNCM Clinical Goal(s):  Patient will work with the Care Management team over the next 30 days to address Transition of Care Barriers: chronic disease mgmt take all medications exactly as prescribed and will call provider for medication related questions as evidenced by med adherence/compliance, calling pharmacy for refills as needed attend all scheduled medical appointments:   as evidenced by completion of PCP, ortho and cardiology appts demonstrate Improved health management independence as evidenced by lowering of A1C  through collaboration with RN Care manager, provider, and care team.   Interventions: Evaluation of current treatment plan related to  self management and patient's adherence to plan as established by provider   Heart Failure Interventions:  (Status:  Goal on track:  Yes.) Short Term Goal Assessed for any changes in condition- pt voices he is doing well-just got back from running errands with wife, had a good holiday with family, feels like his "PNA has completely cleared up'-no resp sxs Discussed with pt his family's wishes for him to get ome in-home support to help him 7 his wife out- pt admits that he does to really like the idea as he does not feel like they need it-however-his children contacted the [place] for services- was told he qualifies for 9 hrs of in home assistance per week he is"going to give it a try" Reviewed with pt upcoming appts- PCP 02/07/23 and cardiology on 02/03/23- pt confirms he is able to get to  appts  Diabetes Interventions:  (Status:  Goal on track:  Yes.) Short Term Goal Assessed patient's understanding of A1c goal: <7% Provided education to patient about basic DM disease process Reviewed medications with patient and discussed importance of medication adherence Counseled on importance of regular laboratory monitoring as prescribed Discussed plans with patient for ongoing care management follow up and provided patient with direct contact information for care management team Lab Results  Component Value Date   HGBA1C 7.3 (A) 09/30/2022  -Nutrition assessment done-pt continues to voice good appetite -Reviewed cbg log-cbgs remain controlled  Patient Goals/Self-Care Activities: Participate in Transition of Care Program/Attend TOC scheduled calls Take all medications as prescribed Attend all scheduled provider appointments Perform all self care activities independently  Call provider office for new concerns or questions  use salt in moderation Maintain safety in the home-report no falls/injuries check blood sugar at prescribed times: once daily take the blood sugar log to all doctor visits  Follow Up Plan:  Telephone follow up appointment with care management team member scheduled for:  02/07/23-1pm The patient has been provided with contact information for the care management team and has been advised to call with any health related questions or concerns.          Patient verbalizes understanding of instructions and care plan provided today and agrees to view in MyChart. Active MyChart status and patient understanding of how to access instructions and care plan via MyChart confirmed with  patient.     The patient has been provided with contact information for the care management team and has been advised to call with any health related questions or concerns.   Please call the care guide team at [phone] if you need to cancel or reschedule your appointment.   Please  call the Suicide and Crisis Lifeline: 988 call the [place] National Suicide Prevention Lifeline: 307-594-9500 or TTY: 9020601953 TTY 2485430662) to talk to a trained counselor if you are experiencing a Mental Health or Behavioral Health Crisis or need someone to talk to.   Antionette Fairy, RN,BSN,CCM RN Care Manager Transitions of Care  [place]/Population Health  Direct Phone: [phone] Toll Free: [phone] Fax: [phone]

## 2023-02-03 ENCOUNTER — Ambulatory Visit: Payer: Medicare Other | Attending: Nurse Practitioner | Admitting: Nurse Practitioner

## 2023-02-03 ENCOUNTER — Encounter: Payer: Self-pay | Admitting: Nurse Practitioner

## 2023-02-03 VITALS — BP 110/62 | HR 84 | Ht 65.0 in | Wt 196.4 lb

## 2023-02-03 DIAGNOSIS — I350 Nonrheumatic aortic (valve) stenosis: Secondary | ICD-10-CM | POA: Diagnosis not present

## 2023-02-03 DIAGNOSIS — Z794 Long term (current) use of insulin: Secondary | ICD-10-CM | POA: Insufficient documentation

## 2023-02-03 DIAGNOSIS — I482 Chronic atrial fibrillation, unspecified: Secondary | ICD-10-CM | POA: Diagnosis not present

## 2023-02-03 DIAGNOSIS — E119 Type 2 diabetes mellitus without complications: Secondary | ICD-10-CM | POA: Diagnosis not present

## 2023-02-03 DIAGNOSIS — I2510 Atherosclerotic heart disease of native coronary artery without angina pectoris: Secondary | ICD-10-CM | POA: Insufficient documentation

## 2023-02-03 DIAGNOSIS — I10 Essential (primary) hypertension: Secondary | ICD-10-CM | POA: Diagnosis not present

## 2023-02-03 DIAGNOSIS — Z8673 Personal history of transient ischemic attack (TIA), and cerebral infarction without residual deficits: Secondary | ICD-10-CM | POA: Insufficient documentation

## 2023-02-03 DIAGNOSIS — I7121 Aneurysm of the ascending aorta, without rupture: Secondary | ICD-10-CM | POA: Insufficient documentation

## 2023-02-03 DIAGNOSIS — E785 Hyperlipidemia, unspecified: Secondary | ICD-10-CM | POA: Insufficient documentation

## 2023-02-03 DIAGNOSIS — R001 Bradycardia, unspecified: Secondary | ICD-10-CM | POA: Insufficient documentation

## 2023-02-03 DIAGNOSIS — I5032 Chronic diastolic (congestive) heart failure: Secondary | ICD-10-CM | POA: Insufficient documentation

## 2023-02-03 NOTE — Patient Instructions (Signed)
Medication Instructions:  Your physician recommends that you continue on your current medications as directed. Please refer to the Current Medication list given to you today.  *If you need a refill on your cardiac medications before your next appointment, please call your pharmacy*   Lab Work: NONE ordered at this time of appointment     Testing/Procedures: Your physician has requested that you have an echocardiogram. Echocardiography is a painless test that uses sound waves to create images of your heart. It provides your doctor with information about the size and shape of your heart and how well your heart's chambers and valves are working. This procedure takes approximately one hour. There are no restrictions for this procedure. Please do NOT wear cologne, perfume, aftershave, or lotions (deodorant is allowed). Please arrive 15 minutes prior to your appointment time.  Please note: We ask at that you not bring children with you during ultrasound (echo/ vascular) testing. Due to room size and safety concerns, children are not allowed in the ultrasound rooms during exams. Our front office staff cannot provide observation of children in our lobby area while testing is being conducted. An adult accompanying a patient to their appointment will only be allowed in the ultrasound room at the discretion of the ultrasound technician under special circumstances. We apologize for any inconvenience.    Follow-Up: At [facility], you and your health needs are our priority.  As part of our continuing mission to provide you with exceptional heart care, we have created designated Provider Care Teams.  These Care Teams include your primary Cardiologist (physician) and Advanced Practice Providers (APPs -  Physician Assistants and Nurse Practitioners) who all work together to provide you with the care you need, when you need it.  We recommend signing up for the patient portal called "MyChart".  Sign  up information is provided on this After Visit Summary.  MyChart is used to connect with patients for Virtual Visits (Telemedicine).  Patients are able to view lab/test results, encounter notes, upcoming appointments, etc.  Non-urgent messages can be sent to your provider as well.   To learn more about what you can do with MyChart, go to [email or web address].    Your next appointment:    April 2025  Provider:   Peter [place], MD     Other Instructions

## 2023-02-03 NOTE — Progress Notes (Addendum)
Office Visit    Patient Name: [name] Date of Encounter: 02/03/2023  Primary Care Provider:  [name], MD Primary Cardiologist:  Peter Swaziland, MD  Chief Complaint    89 year old male with a history of CAD s/p CABG in 1990 with subsequent DES-RCA in 2006, permanent atrial fibrillation, chronic diastolic heart failure, moderate to severe aortic stenosis, bradycardia, presyncope, hypertension, hyperlipidemia, CVA, carotid artery disease, GI bleed, and type 2 diabetes who presents for hospital follow-up related to atrial fibrillation.  Past Medical History    Past Medical History:  Diagnosis Date   Abdominal pain    Acute renal failure (HCC)     resolved   Aortic stenosis    Arthritis    "hands & legs" (11/'10/2014)   Ataxia    Bladder outlet obstruction    BPH (benign prostatic hyperplasia)    CAD (coronary artery disease)    a. CABG IN 1989. b. 01/08/2015 CTO of ost LAD, LIMA to LAD not visualized but assumed patent given myoview finding, occluded SVG to diagonal, 99% mid RCA tx w/ SYNERGY DES 3X28 mm   Chronic heart failure with preserved ejection fraction (HFpEF) (HCC)    Constipation    Diabetes mellitus type 2, insulin dependent (HCC)    Epistaxis, recurrent 04/2015   GERD (gastroesophageal reflux disease)    HTN (hypertension)    Hx of bacterial pneumonia    Hyperlipemia    Kidney stones    Mild cognitive impairment 03/05/2021   MMSE 26/30 and 6CIT 9 03/2021 AWV   Permanent atrial fibrillation (HCC)    Pneumonia 04/2019   Rib fractures    left rib fractures being treated with pain medications   Thoracic aortic aneurysm (HCC)    Urinary tract infection     Enterococcus   Past Surgical History:  Procedure Laterality Date   CARDIAC CATHETERIZATION  1989   CARDIAC CATHETERIZATION N/A 01/08/2015   Procedure: Left Heart Cath and Cors/Grafts Angiography;  Surgeon: Peter M Swaziland, MD;  Location: [facility] INVASIVE CV LAB;  Service: Cardiovascular;   Laterality: N/A;   CARDIAC CATHETERIZATION  01/08/2015   Procedure: Coronary Stent Intervention;  Surgeon: Peter M Swaziland, MD;  Location: [facility] INVASIVE CV LAB;  Service: Cardiovascular;;   CARPAL TUNNEL RELEASE Right 08/2010   CATARACT EXTRACTION W/ INTRAOCULAR LENS  IMPLANT, BILATERAL Bilateral    COLONOSCOPY WITH PROPOFOL N/A 05/05/2022   Procedure: COLONOSCOPY WITH PROPOFOL;  Surgeon: Hilarie Fredrickson, MD;  Location: [facility] ENDOSCOPY;  Service: Gastroenterology;  Laterality: N/A;   CORONARY ANGIOPLASTY  01/08/15   RCA DES   CORONARY ARTERY BYPASS GRAFT  1989   "CABG X 2"   ESOPHAGOGASTRODUODENOSCOPY (EGD) WITH PROPOFOL N/A 05/05/2022   Procedure: ESOPHAGOGASTRODUODENOSCOPY (EGD) WITH PROPOFOL;  Surgeon: Hilarie Fredrickson, MD;  Location: [facility] ENDOSCOPY;  Service: Gastroenterology;  Laterality: N/A;   IR THORACENTESIS ASP PLEURAL SPACE W/IMG GUIDE  05/14/2019   JOINT REPLACEMENT     POLYPECTOMY  05/05/2022   Procedure: POLYPECTOMY;  Surgeon: Hilarie Fredrickson, MD;  Location: [facility] ENDOSCOPY;  Service: Gastroenterology;;   TONSILLECTOMY     TOTAL HIP ARTHROPLASTY Right 2000    Allergies  Allergies  Allergen Reactions   Septra [Sulfamethoxazole-Trimethoprim] Itching   Cefadroxil Other (See Comments)    Unknown reaction    Ciprofloxacin Other (See Comments)    Unknown action     Erythromycin Other (See Comments)    Upsets the stomach      Labs/Other Studies Reviewed    The  following studies were reviewed today:  Cardiac Studies & Procedures   CARDIAC CATHETERIZATION  CARDIAC CATHETERIZATION 01/08/2015  Narrative  Prox RCA lesion, 40% stenosed.  LM lesion, 20% stenosed.  Ost LAD to Prox LAD lesion, 100% stenosed.  SVG .  Origin lesion, 100% stenosed.  Mid RCA-2 lesion, 70% stenosed.  Mid RCA-1 lesion, 99% stenosed. Post intervention, there is a 0% residual stenosis.  1. Severe 2 vessel obstructive CAD. CTO of the origin of the LAD. Critical mid RCA stenosis. 2. Occluded SVG to the  diagonal 3. LIMA to the LAD was not visualized but assumed patent based on clinical history and Myoview findings. 4. Normal LV EDP. 5. Successful stenting of the Mid RCA with DES. Very difficult procedure due to vessel tortuosity.  Plan: DAPT with ASA and Plavix for one month then stop ASA and continue Plavix for at least one year. Resume Coumadin tomorrow. Will assess LV function with an Echo. Stop prilosec and start protonix. Anticipate DC in am if stable. Patient noted to be bradycardic throughout case so will reduce metoprolol to 50 mg daily.  Findings Coronary Findings Diagnostic  Dominance: Right  Left Main  Left Anterior Descending  Ramus Intermedius . Vessel is large.  Left Circumflex  First Obtuse Marginal Branch The vessel is small in size.  Second Obtuse Marginal Branch The vessel is small in size.  Right Coronary Artery . Vessel is large.  Discrete.  Acute Marginal Branch The vessel is small in size.  Single Graft Graft To 1st Diag SVG  Intervention  Mid RCA-1 lesion PCI (Also treats lesions: Mid RCA-2) The pre-interventional distal flow is decreased (TIMI 2). Pre-stent angioplasty was performed. 2.0, 2.5, and 3.0 mm balloons A drug-eluting stent was placed. No post-stent angioplasty was performed. The post-interventional distal flow is normal (TIMI 3). The intervention was successful. No complications occurred at this lesion. Supplies used: STENT SYNERGY DES 3X28 There is a 0% residual stenosis post intervention.  Mid RCA-2 lesion PCI (Also treats lesions: Mid RCA-1) See details in Mid RCA-1 lesion. Supplies used: STENT SYNERGY DES 3X28 There is a 0% residual stenosis post intervention.   STRESS TESTS  MYOCARDIAL PERFUSION IMAGING 12/27/2014  Narrative  T wave inversion was noted during stress in the II, III, aVF, V4, V5 and V6 leads.  Downsloping ST segment depression ST segment depression was noted during stress in the II, III and aVF  leads.  Defect 1: There is a medium defect of moderate severity present in the basal inferior, mid inferior and apical lateral location.  This is an intermediate risk study.  Intermediate risk lexiscan nuclear study demonstrating a medium size defect of moderate intensity in the distal inferolateral and mid to basal inferior wall consistent with ischemia in the RCA territory. (Extent 19%; TPD 15%). Gating not done due to AF.  Clinical correlation is recommended.   ECHOCARDIOGRAM  ECHOCARDIOGRAM COMPLETE 05/02/2022  Narrative ECHOCARDIOGRAM REPORT    Patient Name:   DARIC PASLAY Date of Exam: 05/02/2022 Medical Rec #:  [id number]       Height:       66.0 in Accession #:    [id number]      Weight:       205.0 lb Date of Birth:  October 18, 1928       BSA:          2.021 m Patient Age:    89 years        BP:  139/59 mmHg Patient Gender: M               HR:           77 bpm. Exam Location:  Inpatient  Procedure: 2D Echo, Cardiac Doppler, Color Doppler and Intracardiac Opacification Agent  Indications:    CHF  History:        Patient has prior history of Echocardiogram examinations. CHF, CAD, Prior CABG, Stroke and Carotid Disease, Aortic Valve Disease, Arrythmias:Atrial Fibrillation and Bradycardia; Risk Factors:Hypertension, Diabetes and Dyslipidemia.  Sonographer:    Milda Smart Referring Phys: [id number] CHRISTOPHER L SCHUMANN   Sonographer Comments: Technically difficult study due to poor echo windows. Image acquisition challenging due to patient body habitus and Image acquisition challenging due to respiratory motion. IMPRESSIONS   1. Left ventricular ejection fraction, by estimation, is 60 to 65%. The left ventricle has normal function. The left ventricle has no regional wall motion abnormalities. There is mild left ventricular hypertrophy. Left ventricular diastolic parameters are indeterminate. 2. Right ventricular systolic function was not well visualized. The  right ventricular size is not well visualized. RV is poorly visualized but function appears moderately reduced 3. The mitral valve is normal in structure. No evidence of mitral valve regurgitation. No evidence of mitral stenosis. 4. Left atrial size was severely dilated. 5. Aortic dilatation noted. There is dilatation of the ascending aorta, measuring 40 mm. 6. The aortic valve is tricuspid. There is severe calcification of the aortic valve. Aortic valve regurgitation is mild. Moderate to severe aortic valve stenosis. Moderate AS by gradients (MG 24 mmHg, Vmax 3.0 m/s), severe by AVA (0.7cm^2) and DI (0.24). Low SV index (28 cc/m^2), suspect paradoxical low flow low gradient severe AS  FINDINGS Left Ventricle: Left ventricular ejection fraction, by estimation, is 60 to 65%. The left ventricle has normal function. The left ventricle has no regional wall motion abnormalities. Definity contrast agent was given IV to delineate the left ventricular endocardial borders. The left ventricular internal cavity size was normal in size. There is mild left ventricular hypertrophy. Left ventricular diastolic parameters are indeterminate.  Right Ventricle: The right ventricular size is not well visualized. Right vetricular wall thickness was not well visualized. Right ventricular systolic function was not well visualized.  Left Atrium: Left atrial size was severely dilated.  Right Atrium: Right atrial size was normal in size.  Pericardium: There is no evidence of pericardial effusion.  Mitral Valve: The mitral valve is normal in structure. No evidence of mitral valve regurgitation. No evidence of mitral valve stenosis. MV peak gradient, 4.8 mmHg. The mean mitral valve gradient is 2.0 mmHg.  Tricuspid Valve: The tricuspid valve is normal in structure. Tricuspid valve regurgitation is trivial.  Aortic Valve: The aortic valve is tricuspid. There is severe calcifcation of the aortic valve. Aortic valve  regurgitation is mild. Severe aortic stenosis is present. Aortic valve mean gradient measures 18.7 mmHg. Aortic valve peak gradient measures 26.4 mmHg.  Pulmonic Valve: The pulmonic valve was not well visualized. Pulmonic valve regurgitation is trivial.  Aorta: The aortic root is normal in size and structure and aortic dilatation noted. There is dilatation of the ascending aorta, measuring 40 mm.  IAS/Shunts: The interatrial septum was not well visualized.   LEFT VENTRICLE PLAX 2D LVIDd:         4.50 cm   Diastology LVIDs:         3.70 cm   LV e' medial:    6.30 cm/s LV PW:  1.10 cm   LV E/e' medial:  17.7 LV IVS:        1.10 cm   LV e' lateral:   6.56 cm/s LVOT diam:     2.00 cm   LV E/e' lateral: 17.0 LVOT Area:     3.14 cm   RIGHT VENTRICLE RV S prime:     5.08 cm/s TAPSE (M-mode): 0.9 cm  LEFT ATRIUM            Index        RIGHT ATRIUM           Index LA diam:      4.60 cm  2.28 cm/m   RA Area:     17.10 cm LA Vol (A4C): 101.0 ml 49.97 ml/m  RA Volume:   42.70 ml  21.13 ml/m AORTIC VALVE AV Vmax:           257.00 cm/s AV Vmean:          210.333 cm/s AV VTI:            0.732 m AV Peak Grad:      26.4 mmHg AV Mean Grad:      18.7 mmHg AR Vena Contracta: 0.30 cm  AORTA Ao Root diam: 3.70 cm Ao Asc diam:  4.00 cm  MITRAL VALVE                TRICUSPID VALVE MV Area (PHT): 3.75 cm     TR Peak grad:   15.2 mmHg MV Peak grad:  4.8 mmHg     TR Vmax:        195.00 cm/s MV Mean grad:  2.0 mmHg MV Vmax:       1.10 m/s     SHUNTS MV Vmean:      57.0 cm/s    Systemic Diam: 2.00 cm MV Decel Time: 203 msec MR Peak grad: 25.7 mmHg MR Mean grad: 17.0 mmHg MR Vmax:      253.50 cm/s MR Vmean:     191.0 cm/s MV E velocity: 111.50 cm/s  Epifanio Lesches MD Electronically signed by Epifanio Lesches MD Signature Date/Time: 05/02/2022/2:34:13 PM    Final            Recent Labs: 01/09/2023: ALT 14 01/10/2023: B Natriuretic Peptide 472.9 01/11/2023: TSH  2.093 01/15/2023: BUN 33; Creatinine, Ser 1.12; Hemoglobin 9.2; Magnesium 2.2; Platelets 467; Potassium 3.9; Sodium 133  Recent Lipid Panel    Component Value Date/Time   CHOL 147 03/11/2022 1054   TRIG 75.0 03/11/2022 1054   HDL 52.30 03/11/2022 1054   CHOLHDL 3 03/11/2022 1054   VLDL 15.0 03/11/2022 1054   LDLCALC 80 03/11/2022 1054    History of Present Illness    89 year old male with the above past medical history including CAD s/p CABG in 1990 with subsequent DES-RCA in 2006, permanent atrial fibrillation, chronic diastolic heart failure, moderate to severe aortic stenosis, bradycardia, presyncope, hypertension, hyperlipidemia, CVA, carotid artery disease, GI bleed, and type 2 diabetes.   His atrial fibrillation has been rate controlled on metoprolol and diltiazem.  He is on Eliquis for anticoagulation.  He has been stable on Lasix 40 mg daily for chronic diastolic heart failure.  He was hospitalized in 2021 with acute respiratory failure secondary to pneumonia, pleural effusion, managed with IV antibiotics for thoracentesis.  He was hospitalized again in 09/2021 with sepsis due to pneumonia, managed with antibiotics.  He was readmitted later that month with CHF.  Echocardiogram in 09/2021 showed  EF 55 to 60%, interventricular septum flattening in systole and diastole consistent with RV pressure and volume overload, mild RV systolic dysfunction, mild BAE, mild to moderate aortic stenosis. He was evaluated in the ED in 04/2022 for shortness of breath, dizziness, and presyncope. Prior to this he was following with GI for ongoing issues with GI bleeding and progressive blood loss anemia.  He was scheduled for outpatient colonoscopy.  Upon arrival to the ED he was hypotensive and bradycardic (HR in the 40s-60s). Cardiology was consulted in the setting of bradycardia.  Toprol and diltiazem were held with improvement in HR.  Eliquis was held in the setting of GI bleed.  He tolerated reintroduction of  low-dose metoprolol.  Repeat echocardiogram showed progressive aortic stenosis, moderate to severe. Endoscopy showed no significant bleeding.  He was noted to have moderate hiatal hernia.  Eliquis was resumed post discharge.  He was last seen in the office 12/14/2022  and was stable from a cardiac standpoint.  He was hospitalized in November 2024 in the setting of sepsis due to community-acquired pneumonia with acute hypoxic respiratory failure.  He was treated with antibiotics. Cardiology was consulted in the setting of atrial fibrillation with RVR.  Diltiazem was restarted.   He presents today for follow-up accompanied by his wife.  Since his hospitalization he has done well from a cardiac standpoint.  He has some mild dyspnea with significant exertion, mild orthostatic dizziness, denies presyncope, syncope.  He denies chest pain, palpitations, edema, PND, orthopnea, weight gain.  He is looking forward to spending time with his family over the holidays. Overall, he reports feeling well.  Home Medications    Current Outpatient Medications  Medication Sig Dispense Refill   apixaban (ELIQUIS) 5 MG TABS tablet Take 1 tablet (5 mg total) by mouth 2 (two) times daily. 60 tablet 2   atorvastatin (LIPITOR) 40 MG tablet Take 1 tablet (40 mg total) by mouth at bedtime.     AVODART 0.5 MG capsule Take 0.5 mg by mouth every other day.     Cholecalciferol (VITAMIN D3 PO) Take 50 mcg by mouth daily.     diltiazem (CARDIZEM CD) 120 MG 24 hr capsule Take 1 capsule (120 mg total) by mouth daily. 30 capsule 0   empagliflozin (JARDIANCE) 25 MG TABS tablet Take 1 tablet (25 mg total) by mouth daily before breakfast. 30 tablet    furosemide (LASIX) 40 MG tablet TAKE 1 TABLET(40 MG) BY MOUTH DAILY (Patient taking differently: Take 40 mg by mouth in the morning.) 90 tablet 3   gabapentin (NEURONTIN) 300 MG capsule Take 2 capsules (600 mg total) by mouth at bedtime. 180 capsule 5   insulin glargine (LANTUS) 100 UNIT/ML  injection Inject 38 Units into the skin every morning.     iron polysaccharides (NIFEREX) 150 MG capsule Take 1 capsule (150 mg total) by mouth daily. 30 capsule 0   meloxicam (MOBIC) 7.5 MG tablet Take 7.5 mg by mouth daily as needed.     metFORMIN (GLUCOPHAGE) 500 MG tablet Take 500 mg by mouth 2 (two) times daily with a meal.     methocarbamol (ROBAXIN) 500 MG tablet Take 1,000 mg by mouth 3 (three) times daily as needed.     metoprolol tartrate (LOPRESSOR) 50 MG tablet Take 1 tablet (50 mg total) by mouth 2 (two) times daily. 180 tablet 3   pantoprazole (PROTONIX) 40 MG tablet TAKE 1 TABLET BY MOUTH DAILY AT NOON (Patient taking differently: Take 40 mg by mouth daily  at 12 noon.) 90 tablet 3   polyethylene glycol (MIRALAX / GLYCOLAX) 17 g packet Take 17 g by mouth daily.     tamsulosin (FLOMAX) 0.4 MG CAPS capsule Take 0.4 mg by mouth daily.     traMADol (ULTRAM) 50 MG tablet Take 1 tablet (50 mg total) by mouth 2 (two) times daily as needed. 60 tablet 5   TYLENOL 500 MG tablet Take 500-1,000 mg by mouth every 6 (six) hours as needed for mild pain or headache.     No current facility-administered medications for this visit.     Review of Systems    He denies chest pain, palpitations, pnd, orthopnea, n, v, syncope, edema, weight gain, or early satiety. All other systems reviewed and are otherwise negative except as noted above.   Physical Exam    VS:  BP 110/62 (BP Location: Left Arm, Patient Position: Sitting, Cuff Size: Normal)   Pulse 84   Ht 5\' 5"  (1.651 m)   Wt 196 lb 6.4 oz (89.1 kg)   SpO2 100%   BMI 32.68 kg/m   GEN: Well nourished, well developed, in no acute distress. HEENT: normal. Neck: Supple, no JVD, carotid bruits, or masses. Cardiac: IRIR, 2/6 murmur, no rubs, or gallops. No clubbing, cyanosis, edema.  Radials/DP/PT 2+ and equal bilaterally.  Respiratory:  Respirations regular and unlabored, clear to auscultation bilaterally. GI: Soft, nontender, nondistended, BS +  x 4. MS: no deformity or atrophy. Skin: warm and dry, no rash. Neuro:  Strength and sensation are intact. Psych: Normal affect.  Accessory Clinical Findings    ECG personally reviewed by me today - EKG Interpretation Date/Time:  Thursday February 03 2023 08:55:58 EST Ventricular Rate:  84 PR Interval:    QRS Duration:  90 QT Interval:  378 QTC Calculation: 446 R Axis:   72  Text Interpretation: Atrial fibrillation When compared with ECG of 09-Jan-2023 17:02, PREVIOUS ECG IS PRESENT Confirmed by [name] ([id number]) on 02/03/2023 8:57:50 AM  - no acute changes.   Lab Results  Component Value Date   WBC 24.7 (H) 01/15/2023   HGB 9.2 (L) 01/15/2023   HCT 30.1 (L) 01/15/2023   MCV 74.5 (L) 01/15/2023   PLT 467 (H) 01/15/2023   Lab Results  Component Value Date   CREATININE 1.12 01/15/2023   BUN 33 (H) 01/15/2023   NA 133 (L) 01/15/2023   K 3.9 01/15/2023   CL 101 01/15/2023   CO2 23 01/15/2023   Lab Results  Component Value Date   ALT 14 01/09/2023   AST 18 01/09/2023   ALKPHOS 62 01/09/2023   BILITOT 1.5 (H) 01/09/2023   Lab Results  Component Value Date   CHOL 147 03/11/2022   HDL 52.30 03/11/2022   LDLCALC 80 03/11/2022   TRIG 75.0 03/11/2022   CHOLHDL 3 03/11/2022    Lab Results  Component Value Date   HGBA1C 7.3 (A) 09/30/2022    Assessment & Plan    1. Permanent atrial fibrillation/bradycardia: History of bradycardia, metoprolol was previously decreased, diltiazem was held.  Recent hospitalization in the setting of sepsis due to community-acquired pneumonia, atrial fibrillation with RVR.  Diltiazem was restarted.  EKG today shows atrial fibrillation, HR 84 bpm.  Continue monitor HR.  For now, continue diltiazem, metoprolol, Eliquis.   2. Presyncope: Occurred in the setting of symptomatic anemia, hypotension and bradycardia.  Repeat echocardiogram showed progression of aortic stenosis, otherwise stable.  He notes occasional orthostatic dizziness, denies  presyncope, syncope.  HR and BP  are stable.  No indication for further testing at this time.   3. CAD:  S/p CABG in 1990 with subsequent DES-RCA in 2006.  He notes mild dyspnea with significant exertion, unchanged from prior visits.  Otherwise stable with no anginal symptoms. No indication for ischemic evaluation.  Continue metoprolol, Lipitor.   4. Chronic diastolic heart failure: Most recent echo stable.  Euvolemic and well compensated on exam.  Continue Lasix.   5. Aortic stenosis: Most recent echo showed progression of aortic stenosis from mild to moderate to moderate to severe (mean gradient increased from 19.3 mmHg in 09/2021 to 24.0 mm Hg in 04/2022.  Asymptomatic.  Plan for repeat echocardiogram in 04/2023, will order today.    6. Hypertension: BP well controlled. Continue current antihypertensive regimen.    7. Hyperlipidemia: LDL was 80 in 03/2022.  Continue Lipitor.   8. Dilation of ascending aorta: Stable at 40 mm on most recent echo. Not likely a candidate for surgery per Dr. Swaziland.  Repeat echo pending as above.   9. History of CVA: No recurrence. Continue Eliquis, Lipitor.  10. Type 2 diabetes: A1c was 7.3 in 09/2022.  Monitored and managed per PCP.   11. Disposition: Follow-up in 05/2023 with Dr. Swaziland, sooner if needed.       Joylene Grapes, NP 02/03/2023, 9:12 AM

## 2023-02-07 ENCOUNTER — Ambulatory Visit (INDEPENDENT_AMBULATORY_CARE_PROVIDER_SITE_OTHER): Payer: Medicare Other | Admitting: Family Medicine

## 2023-02-07 ENCOUNTER — Other Ambulatory Visit: Payer: Self-pay

## 2023-02-07 ENCOUNTER — Encounter: Payer: Self-pay | Admitting: Family Medicine

## 2023-02-07 VITALS — BP 130/64 | HR 92 | Temp 99.2°F | Ht 65.0 in | Wt 195.2 lb

## 2023-02-07 DIAGNOSIS — I482 Chronic atrial fibrillation, unspecified: Secondary | ICD-10-CM

## 2023-02-07 DIAGNOSIS — J189 Pneumonia, unspecified organism: Secondary | ICD-10-CM

## 2023-02-07 DIAGNOSIS — Z9861 Coronary angioplasty status: Secondary | ICD-10-CM

## 2023-02-07 DIAGNOSIS — A419 Sepsis, unspecified organism: Secondary | ICD-10-CM

## 2023-02-07 DIAGNOSIS — I2510 Atherosclerotic heart disease of native coronary artery without angina pectoris: Secondary | ICD-10-CM | POA: Diagnosis not present

## 2023-02-07 DIAGNOSIS — I5032 Chronic diastolic (congestive) heart failure: Secondary | ICD-10-CM | POA: Diagnosis not present

## 2023-02-07 DIAGNOSIS — I152 Hypertension secondary to endocrine disorders: Secondary | ICD-10-CM | POA: Diagnosis not present

## 2023-02-07 DIAGNOSIS — Z794 Long term (current) use of insulin: Secondary | ICD-10-CM

## 2023-02-07 DIAGNOSIS — E1159 Type 2 diabetes mellitus with other circulatory complications: Secondary | ICD-10-CM | POA: Diagnosis not present

## 2023-02-07 DIAGNOSIS — E1121 Type 2 diabetes mellitus with diabetic nephropathy: Secondary | ICD-10-CM | POA: Diagnosis not present

## 2023-02-07 DIAGNOSIS — E119 Type 2 diabetes mellitus without complications: Secondary | ICD-10-CM

## 2023-02-07 LAB — POCT GLYCOSYLATED HEMOGLOBIN (HGB A1C): Hemoglobin A1C: 7 % — AB (ref 4.0–5.6)

## 2023-02-07 NOTE — Patient Instructions (Signed)
Please return in 3 months for diabetes follow up   If you have any questions or concerns, please don't hesitate to send me a message via MyChart or call the office at [phone]. Thank you for visiting with us today! It's our pleasure caring for you.  

## 2023-02-07 NOTE — Patient Instructions (Signed)
Visit Information  Thank you for taking time to visit with me today. Please don't hesitate to contact me if I can be of assistance to you before our next scheduled telephone appointment.  Our next appointment is by telephone on 02/14/23 at 1pm  Following is a copy of your care plan:   Goals Addressed             This Visit's Progress    TOC Care Plan       Current Barriers:  Chronic Disease Management support and education needs related to CHF and DMII   RNCM Clinical Goal(s):  Patient will work with the Care Management team over the next 30 days to address Transition of Care Barriers: chronic disease mgmt take all medications exactly as prescribed and will call provider for medication related questions as evidenced by med adherence/compliance, calling pharmacy for refills as needed attend all scheduled medical appointments:   as evidenced by completion of PCP, ortho and cardiology appts demonstrate Improved health management independence as evidenced by lowering of A1C  through collaboration with RN Care manager, provider, and care team.   Interventions: Evaluation of current treatment plan related to  self management and patient's adherence to plan as established by provider   Heart Failure Interventions:  (Status:  Goal on track:  Yes.) Short Term Goal Assessed for any changes in condition- pt pleased to share he is doing well-getting stronger-walking around more unassisted, back driving short distances Assessed status of in home support from [place]- [facility] and getting services in place although he continues to feel like they don't need any assistance Reviewed with pt upcoming appts- he goes to [place] appt on next week  Diabetes Interventions:  (Status:  Goal on track:  Yes.) Short Term Goal Reviewed and dicussed pt's recent PCP visit earlier today-pt states appt went well-MD changes/decreased his Lantus insulin due to his cbgs running on the lower  side to prevent hypoglycemia Reviewed and discussed with pt s/s of hypoglycemia and how to treat -Nutrition assessment done-pt continues to voice good appetite -Reviewed cbg log-cbgs remain controlled, cbg this AM -81  Patient Goals/Self-Care Activities: Participate in Transition of Care Program/Attend TOC scheduled calls Take all medications as prescribed Attend all scheduled provider appointments Perform all self care activities independently  Call provider office for new concerns or questions  use salt in moderation Maintain safety in the home-report no falls/injuries check blood sugar at prescribed times: once daily take the blood sugar log to all doctor visits  Follow Up Plan:  Telephone follow up appointment with care management team member scheduled for:  02/14/23-1pm The patient has been provided with contact information for the care management team and has been advised to call with any health related questions or concerns.          Patient verbalizes understanding of instructions and care plan provided today and agrees to view in MyChart. Active MyChart status and patient understanding of how to access instructions and care plan via MyChart confirmed with patient.     The patient has been provided with contact information for the care management team and has been advised to call with any health related questions or concerns.   Please call the care guide team at [phone] if you need to cancel or reschedule your appointment.   Please call the [place] National Suicide Prevention Lifeline: 785-746-2943 or TTY: 785-587-7984 TTY (312) 155-5621) to talk to a trained counselor call 1-800-273-TALK (toll free,  24 hour hotline) if you are experiencing a Mental Health or Behavioral Health Crisis or need someone to talk to.  Antionette Fairy, RN,BSN,CCM RN Care Manager Transitions of Care  [place]  Direct Phone: [phone] Toll Free:  [phone] Fax: [phone]

## 2023-02-07 NOTE — Progress Notes (Signed)
Subjective  CC:  Chief Complaint  Patient presents with   Hospitalization Follow-up    01/09/2023 - 01/15/2023 (6 days) [facility]   Sepsis due to pneumonia      HPI: [name] is a 89 y.o. male who presents to the office today for follow up of diabetes and problems listed above in the chief complaint.  Hospital follow-up: Hospitalized as noted above, sepsis due to pneumonia.  Treated with IV antibiotics.  Cardiology monitored.  Fortunately he responded well to treatment.  Now feeling almost back to baseline.  He reports his appetite overall is less than usual but energy level has rebounded.  Able to walk to the grocery store and shop at this time.  No chest pain or shortness of breath. Permanent A-fib and heart failure: Had recent follow-up visit with cardiology.  Things remain stable.  No change in medications were made. Diabetes follow up: His diabetic control is reported as Improved.  Although, fastings are running lower than usual for him.  He has had several readings in the 80s.  Mostly asymptomatic.  He is eating well but eating less than usual.  Reviewed medications.  He denies exertional CP or SOB or symptomatic hypoglycemia. He denies foot sores or paresthesias.   Wt Readings from Last 3 Encounters:  02/07/23 195 lb 3.2 oz (88.5 kg)  02/03/23 196 lb 6.4 oz (89.1 kg)  01/15/23 198 lb 6.6 oz (90 kg)    BP Readings from Last 3 Encounters:  02/07/23 130/64  02/03/23 110/62  01/15/23 130/75    Assessment  1. Sepsis due to pneumonia (HCC)   2. Type 2 diabetes mellitus with diabetic nephropathy, with long-term current use of insulin (HCC)   3. Hypertension associated with diabetes (HCC)   4. Recurrent coronary arteriosclerosis after percutaneous transluminal coronary angioplasty   5. Insulin-requiring or dependent type II diabetes mellitus (HCC)   6. Chronic heart failure with preserved ejection fraction (HFpEF) (HCC)   7. Chronic atrial fibrillation  (HCC)      Plan  Follow-up sepsis from pneumonia: Lungs are clear.  No shortness of breath.  Resolved. Diabetes is currently very well controlled.  Fasting sugars are running low for him however, want to avoid hypoglycemia.  Therefore, lower Lantus from 38 units daily to 35 units daily and he will continue daily monitoring.  May be able to lower a bit further.  He will monitor his diet as well.  He will let me know if his sugars are not improving or appetite worsens.  No other medication changes made today Blood pressure, heart failure and permanent A-fib are well-controlled on current medications  Follow up: 3 months for recheck Orders Placed This Encounter  Procedures   POCT HgB A1C   No orders of the defined types were placed in this encounter.     Immunization History  Administered Date(s) Administered   Fluad Quad(high Dose 65+) 11/02/2018, 11/30/2019, 11/07/2020, 11/11/2021   Fluad Trivalent(High Dose 65+) 11/23/2022   Influenza Split 11/14/2007, 01/01/2009, 12/23/2009, 11/29/2012, 10/31/2015   Influenza, High Dose Seasonal PF 11/02/2012, 12/12/2013, 11/15/2014, 11/24/2017, 01/17/2018   Influenza, Seasonal, Injecte, Preservative Fre 11/18/2015   Influenza,inj,Quad PF,6+ Mos 02/15/2017   Influenza,trivalent, recombinat, inj, PF 12/07/2010   Influenza-Unspecified 10/28/2011, 11/30/2014, 02/15/2017   PFIZER Comirnaty(Gray Top)Covid-19 Tri-Sucrose Vaccine 06/21/2020   PFIZER(Purple Top)SARS-COV-2 Vaccination 03/23/2019, 04/14/2019, 12/06/2019, 06/21/2020   PNEUMOCOCCAL CONJUGATE-20 08/06/2020   Pfizer Covid-19 Vaccine Bivalent Booster 28yrs & up 01/02/2021   Pfizer(Comirnaty)Fall Seasonal Vaccine 12 years  and older 03/13/2022   Pneumococcal Conjugate-13 12/13/2013   Pneumococcal Polysaccharide-23 08/20/2008   Td 05/29/2002   Tdap 10/28/2011, 02/15/2017   Zoster Recombinant(Shingrix) 09/16/2016, 10/13/2016, 12/13/2016, 01/11/2017   Zoster, Live 08/20/2008    Diabetes Related  Lab Review: Lab Results  Component Value Date   HGBA1C 7.0 (A) 02/07/2023   HGBA1C 7.3 (A) 09/30/2022   HGBA1C 7.1 (A) 06/10/2022    Lab Results  Component Value Date   MICROALBUR <0.7 03/14/2019   Lab Results  Component Value Date   CREATININE 1.12 01/15/2023   BUN 33 (H) 01/15/2023   NA 133 (L) 01/15/2023   K 3.9 01/15/2023   CL 101 01/15/2023   CO2 23 01/15/2023   Lab Results  Component Value Date   CHOL 147 03/11/2022   CHOL 154 04/14/2021   CHOL 154 08/04/2020   Lab Results  Component Value Date   HDL 52.30 03/11/2022   HDL 54.70 04/14/2021   HDL 46.10 08/04/2020   Lab Results  Component Value Date   LDLCALC 80 03/11/2022   LDLCALC 75 04/14/2021   LDLCALC 90 08/04/2020   Lab Results  Component Value Date   TRIG 75.0 03/11/2022   TRIG 119.0 04/14/2021   TRIG 86.0 08/04/2020   Lab Results  Component Value Date   CHOLHDL 3 03/11/2022   CHOLHDL 3 04/14/2021   CHOLHDL 3 08/04/2020   No results found for: "LDLDIRECT" The ASCVD Risk score (Arnett DK, et al., 2019) failed to calculate for the following reasons:   The 2019 ASCVD risk score is only valid for ages 22 to 18   The patient has a prior MI or stroke diagnosis I have reviewed the PMH, Fam and Soc history. Patient Active Problem List   Diagnosis Date Noted Date Diagnosed   History of lower GI bleeding 06/10/2022     Priority: High    Heme positive stools in setting of worsening anemia: colonoscopy 2024 several polyps, likely chronic bleed from polyps. Tics; otherwise ok EGD at same time: nl esophagus Dr. Marina Goodell    Mild cognitive impairment 03/05/2021     Priority: High    MMSE 26/30 and 6CIT 9 03/2021 AWV    Moderate nonproliferative diabetic retinopathy of left eye (HCC) 06/18/2020     Priority: High   CKD stage 3a, GFR 45-59 ml/min (HCC) 05/26/2020     Priority: High   Aortic stenosis, moderate 05/22/2019     Priority: High    Stable echocardiogram 05/2020, Dr. [place]    Chronic heart  failure with preserved ejection fraction (HFpEF) (HCC) 03/14/2019     Priority: High   Spinal stenosis of lumbar region 02/21/2018     Priority: High   Diabetic peripheral neuropathy associated with type 2 diabetes mellitus (HCC) 05/05/2017     Priority: High    No pain    Atherosclerosis of native coronary artery of native heart without angina pectoris 06/13/2016     Priority: High    Overview:  Overview:  RCA DES placed Nov 2016, LIMA-LAD presumed patent based on Myoview but no visualized, SVG-Dx occluded  Last Assessment & Plan:  RCA DES placed Nov 2016, LIMA-LAD presumed patent based on Myoview but no visualized, SVG-Dx occluded    CVA (cerebral vascular accident) (HCC) 06/06/2016     Priority: High   Recurrent coronary arteriosclerosis after percutaneous transluminal coronary angioplasty 04/18/2015     Priority: High    Overview:  Overview:  RCA DES placed Nov 2016, LIMA-LAD presumed patent based on  Myoview but no visualized, SVG-Dx occluded  Last Assessment & Plan:  RCA DES placed Nov 2016, LIMA-LAD presumed patent based on Myoview but no visualized, SVG-Dx occluded    CAD S/P PCI- Nov 2016      Priority: High    RCA DES placed Nov 2016, LIMA-LAD presumed patent based on Myoview but no visualized, SVG-Dx occluded    Chronic anticoagulation 02/06/2013     Priority: High    Overview:  Monitored by cardiology    Chronic atrial fibrillation (HCC) 07/30/2011     Priority: High    CHADs VASc=5 for age, HTN, vascular disease, and DM    Hx of CABG x 2 1990      Priority: High    Status post CABG x2 in 1990 including an LIMA graft to the LAD, and a vein graft to the diagonal.     Hypertension associated with diabetes (HCC)      Priority: High   Diabetes mellitus with diabetic nephropathy, with long-term current use of insulin (HCC)      Priority: High    Neg urine microalbuminuria, not on ace.  On farxiga    Combined hyperlipidemia associated with type 2 diabetes  mellitus (HCC)      Priority: High   Thoracic aortic aneurysm without rupture (HCC) 05/26/2020     Priority: Medium     Followed by cards. Stable by CT 2022    Degeneration of lumbar intervertebral disc 02/21/2018     Priority: Medium    Chronic vertigo 03/15/2014     Priority: Medium    Obesity (BMI 30.0-34.9) 01/31/2012     Priority: Medium    Enlarged prostate without lower urinary tract symptoms (luts) 01/04/2011     Priority: Medium     Overview:  Urology - avodart and tamuloscin    Gastro-esophageal reflux disease without esophagitis 12/07/2010     Priority: Medium    Hiatal hernia 05/05/2022     Priority: Low   Osteoarthritis of metacarpophalangeal (MCP) joint of left thumb 01/18/2022     Priority: Low   Stable hemispheric central retinal vein occlusion (CRVO) of right eye 07/21/2020     Priority: Low   Lower extremity edema 03/14/2019     Priority: Low   Epistaxis, recurrent 04/11/2015     Priority: Low   Osteoarthritis, knee 01/31/2012     Priority: Low   Allergic rhinitis 10/28/2011     Priority: Low   Hearing loss 06/08/2011     Priority: Low   Primary osteoarthritis of hand 06/08/2011     Priority: Low   Benign hematuria 08/27/2008     Priority: Low    Essential Hematuria - urology - Isabel Caprice w/u benign     Atrial fibrillation with RVR (HCC) 01/10/2023    Sepsis without acute organ dysfunction (HCC) 01/10/2023    Pelvic fluid collection 01/10/2023    Nonrheumatic aortic valve stenosis 01/10/2023    Long term (current) use of anticoagulants 01/10/2023    Sepsis due to pneumonia (HCC) 01/09/2023    Hyponatremia 01/09/2023    Fluid collection at surgical site 01/09/2023    Multifocal pneumonia 06/02/2016     Social History: Patient  reports that he quit smoking about 64 years ago. His smoking use included cigarettes. He started smoking about 74 years ago. He has a 30 pack-year smoking history. He has never used smokeless tobacco. He reports that he does  not drink alcohol and does not use drugs.  Review of Systems: Ophthalmic:  negative for eye pain, loss of vision or double vision Cardiovascular: negative for chest pain Respiratory: negative for SOB or persistent cough Gastrointestinal: negative for abdominal pain Genitourinary: negative for dysuria or gross hematuria MSK: negative for foot lesions Neurologic: negative for weakness or gait disturbance  Objective  Vitals: BP 130/64   Pulse 92   Temp 99.2 F (37.3 C)   Ht 5\' 5"  (1.651 m)   Wt 195 lb 3.2 oz (88.5 kg)   SpO2 97%   BMI 32.48 kg/m  General: well appearing, no acute distress  Psych:  Alert and oriented, normal mood and affect HEENT:  Normocephalic, atraumatic, moist mucous membranes, supple neck  Cardiovascular: Irregularly irregular respiratory:  Good breath sounds bilaterally, CTAB with normal effort, no rales   Diabetic education: ongoing education regarding chronic disease management for diabetes was given today. We continue to reinforce the ABC's of diabetic management: A1c (<7 or 8 dependent upon patient), tight blood pressure control, and cholesterol management with goal LDL < 100 minimally. We discuss diet strategies, exercise recommendations, medication options and possible side effects. At each visit, we review recommended immunizations and preventive care recommendations for diabetics and stress that good diabetic control can prevent other problems. See below for this patient's data.   Commons side effects, risks, benefits, and alternatives for medications and treatment plan prescribed today were discussed, and the patient expressed understanding of the given instructions. Patient is instructed to call or message via MyChart if he/she has any questions or concerns regarding our treatment plan. No barriers to understanding were identified. We discussed Red Flag symptoms and signs in detail. Patient expressed understanding regarding what to do in case of urgent or  emergency type symptoms.  Medication list was reconciled, printed and provided to the patient in AVS. Patient instructions and summary information was reviewed with the patient as documented in the AVS. This note was prepared with assistance of Dragon voice recognition software. Occasional wrong-word or sound-a-like substitutions may have occurred due to the inherent limitations of voice recognition software

## 2023-02-07 NOTE — Patient Outreach (Signed)
Care Management  Transitions of Care Program Transitions of Care Post-discharge week 4   02/07/2023 Name: [name] MRN: [id number] DOB: 1929-01-01  Subjective: [name] is a 89 y.o. year old male who is a primary care patient of [name], MD. The Care Management team  Engaged with patient by telephone to assess and address transitions of care needs.   Consent to Services:  Patient was given information about care management services, agreed to services, and gave verbal consent to participate.   Assessment:   Patient voices he has had a good week. No new issues. He saw PCP earlier today-insulin decreased. Appetite remains good. No issues with elimination. He is walking and feeling stronger and better. No RN CM needs or concerns at this time.         SDOH Interventions    Flowsheet Row Telephone from 01/17/2023 in [place] [facility] Patient Outreach from 10/14/2022 in [place] [facility] Clinical Support from 03/11/2022 in [facility] [place] Chronic Care Management from 02/26/2022 in [facility] [place] [facility] Coordination from 10/19/2021 in [facility] Care Coordination Patient Outreach Telephone from 10/08/2021 in [facility] Care Coordination  SDOH Interventions        Food Insecurity Interventions Intervention Not Indicated Intervention Not Indicated Intervention Not Indicated Intervention Not Indicated Intervention Not Indicated Intervention Not Indicated  Housing Interventions Intervention Not Indicated -- Intervention Not Indicated Intervention Not Indicated Intervention Not Indicated --  Transportation Interventions Intervention Not Indicated  [pt declined transportation resources, he drives himself to appts] Intervention Not Indicated Intervention Not Indicated Intervention Not Indicated Intervention Not  Indicated Other (Comment)  [needs transportation for post hospital visit referral to transportation]  Utilities Interventions Intervention Not Indicated Intervention Not Indicated Intervention Not Indicated Intervention Not Indicated -- --  Financial Strain Interventions -- -- Intervention Not Indicated Intervention Not Indicated -- --  Physical Activity Interventions -- -- Intervention Not Indicated Patient Refused -- --  Stress Interventions -- -- Intervention Not Indicated Intervention Not Indicated -- --  Social Connections Interventions -- -- Intervention Not Indicated Intervention Not Indicated, Patient Refused -- --        Goals Addressed             This Visit's Progress    TOC Care Plan       Current Barriers:  Chronic Disease Management support and education needs related to CHF and DMII   RNCM Clinical Goal(s):  Patient will work with the Care Management team over the next 30 days to address Transition of Care Barriers: chronic disease mgmt take all medications exactly as prescribed and will call provider for medication related questions as evidenced by med adherence/compliance, calling pharmacy for refills as needed attend all scheduled medical appointments:   as evidenced by completion of PCP, ortho and cardiology appts demonstrate Improved health management independence as evidenced by lowering of A1C  through collaboration with RN Care manager, provider, and care team.   Interventions: Evaluation of current treatment plan related to  self management and patient's adherence to plan as established by provider   Heart Failure Interventions:  (Status:  Goal on track:  Yes.) Short Term Goal Assessed for any changes in condition- pt pleased to share he is doing well-getting stronger-walking around more unassisted, back driving short distances Assessed status of in home support from [place]- [facility]  and getting services in place although he  continues to feel like they don't need any assistance Reviewed with pt upcoming appts- he goes to [place] appt on next week  Diabetes Interventions:  (Status:  Goal on track:  Yes.) Short Term Goal Reviewed and dicussed pt's recent PCP visit earlier today-pt states appt went well-MD changes/decreased his Lantus insulin due to his cbgs running on the lower side to prevent hypoglycemia Reviewed and discussed with pt s/s of hypoglycemia and how to treat -Nutrition assessment done-pt continues to voice good appetite -Reviewed cbg log-cbgs remain controlled, cbg this AM -81  Patient Goals/Self-Care Activities: Participate in Transition of Care Program/Attend TOC scheduled calls Take all medications as prescribed Attend all scheduled provider appointments Perform all self care activities independently  Call provider office for new concerns or questions  use salt in moderation Maintain safety in the home-report no falls/injuries check blood sugar at prescribed times: once daily take the blood sugar log to all doctor visits  Follow Up Plan:  Telephone follow up appointment with care management team member scheduled for:  02/14/23-1pm The patient has been provided with contact information for the care management team and has been advised to call with any health related questions or concerns.          Plan: The patient has been provided with contact information for the care management team and has been advised to call with any health related questions or concerns.  Follow up appt scheduled with pt for 02/14/23-1 pm.  Antionette Fairy, RN,BSN,CCM RN Care Manager Transitions of Care  [place]  Direct Phone: [phone] Toll Free: [phone] Fax: [phone]

## 2023-02-11 LAB — LAB REPORT - SCANNED
A1c: 7.3
Creatinine, POC: 17.6 mg/dL
EGFR: 48
Microalb Creat Ratio: 136.7
Microalbumin, Urine: 2.4

## 2023-02-14 ENCOUNTER — Other Ambulatory Visit: Payer: Self-pay

## 2023-02-14 NOTE — Patient Instructions (Signed)
Visit Information  Thank you for taking time to visit with me today. Please don't hesitate to contact me if I can be of assistance to you before our next scheduled telephone appointment.  Following is a copy of your care plan:   Goals Addressed             This Visit's Progress    COMPLETED: TOC Care Plan       Current Barriers:  Chronic Disease Management support and education needs related to CHF and DMII   RNCM Clinical Goal(s):  Patient will work with the Care Management team over the next 30 days to address Transition of Care Barriers: chronic disease mgmt take all medications exactly as prescribed and will call provider for medication related questions as evidenced by med adherence/compliance, calling pharmacy for refills as needed attend all scheduled medical appointments:   as evidenced by completion of PCP, ortho and cardiology appts demonstrate Improved health management independence as evidenced by lowering of A1C  through collaboration with RN Care manager, provider, and care team.   Interventions: Evaluation of current treatment plan related to  self management and patient's adherence to plan as established by provider   Heart Failure Interventions:  (Status:  Goal Met.) Short Term Goal Assessed for any changes in condition- pt pleased to share he is doing well except for some frequent urination-taking Lasix and Flomax- pt will call to discuss with provider  Diabetes Interventions:  (Status:  Goal Met.) Short Term Goal Reviewed and dicussed pt's recent cbgs readings and values -Nutrition assessment done-pt continues to voice good appetite   Patient Goals/Self-Care Activities: Participate in Transition of Care Program/Attend TOC scheduled calls Take all medications as prescribed Attend all scheduled provider appointments Perform all self care activities independently  Call provider office for new concerns or questions  use salt in moderation Maintain safety in the  home-report no falls/injuries check blood sugar at prescribed times: once daily take the blood sugar log to all doctor visits  Follow Up Plan:  The patient has been provided with contact information for the care management team and has been advised to call with any health related questions or concerns.  Patient has completed 30-day TOC program.         Patient verbalizes understanding of instructions and care plan provided today and agrees to view in MyChart. Active MyChart status and patient understanding of how to access instructions and care plan via MyChart confirmed with patient.     The patient has been provided with contact information for the care management team and has been advised to call with any health related questions or concerns.   Please call the care guide team at [phone] if you need to cancel or reschedule your appointment.   Please call the [place] National Suicide Prevention Lifeline: 204-783-1028 or TTY: [phone] TTY 304-669-0510) to talk to a trained counselor call 1-800-273-TALK (toll free, 24 hour hotline) if you are experiencing a Mental Health or Behavioral Health Crisis or need someone to talk to.  Antionette Fairy, RN,BSN,CCM RN Care Manager Transitions of Care  [place]  Direct Phone: [phone] Toll Free: [phone] Fax: [phone]

## 2023-02-14 NOTE — Patient Outreach (Signed)
Care Management  Transitions of Care Program Transitions of Care Post-discharge Week 5   02/14/2023 Name: [name] MRN: [id number] DOB: August 17, 1928  Subjective: Amias Zur is a 89 y.o. year old male who is a primary care patient of [name], MD. The Care Management team Engaged with patient by telephone to assess and address transitions of care needs.   Consent to Services:  Patient has successfully completed 30-day TOC program. Declined transfer to longitudinal RN CM. Case is being closed at this time. Patient has RN CM contact info and aware they can contact RN CM in the future if needs arise.  Assessment:  Pt voices he is doing well except for freq urination. Voices urine is clear-denies any s/s of infection. He will call and discuss with provider. Denies any RN CM needs or concerns at this time. Patient was appreciative of call-but thinks he is doing well and does not need continued calls.          SDOH Interventions    Flowsheet Row Telephone from 01/17/2023 in [place] [facility] DEPARTMENT Patient Outreach from 10/14/2022 in [facility] POPULATION HEALTH DEPARTMENT Clinical Support from 03/11/2022 in [facility] Care Management from 02/26/2022 in [facility] Coordination from 10/19/2021 in [facility] Care Coordination Patient Outreach Telephone from 10/08/2021 in [facility] Care Coordination  SDOH Interventions        Food Insecurity Interventions Intervention Not Indicated Intervention Not Indicated Intervention Not Indicated Intervention Not Indicated Intervention Not Indicated Intervention Not Indicated  Housing Interventions Intervention Not Indicated -- Intervention Not Indicated Intervention Not Indicated Intervention Not Indicated --  Transportation Interventions Intervention Not Indicated  [pt declined  transportation resources, he drives himself to appts] Intervention Not Indicated Intervention Not Indicated Intervention Not Indicated Intervention Not Indicated Other (Comment)  [needs transportation for post hospital visit referral to transportation]  Utilities Interventions Intervention Not Indicated Intervention Not Indicated Intervention Not Indicated Intervention Not Indicated -- --  Financial Strain Interventions -- -- Intervention Not Indicated Intervention Not Indicated -- --  Physical Activity Interventions -- -- Intervention Not Indicated Patient Refused -- --  Stress Interventions -- -- Intervention Not Indicated Intervention Not Indicated -- --  Social Connections Interventions -- -- Intervention Not Indicated Intervention Not Indicated, Patient Refused -- --        Goals Addressed             This Visit's Progress    COMPLETED: TOC Care Plan       Current Barriers:  Chronic Disease Management support and education needs related to CHF and DMII   RNCM Clinical Goal(s):  Patient will work with the Care Management team over the next 30 days to address Transition of Care Barriers: chronic disease mgmt take all medications exactly as prescribed and will call provider for medication related questions as evidenced by med adherence/compliance, calling pharmacy for refills as needed attend all scheduled medical appointments:   as evidenced by completion of PCP, ortho and cardiology appts demonstrate Improved health management independence as evidenced by lowering of A1C  through collaboration with RN Care manager, provider, and care team.   Interventions: Evaluation of current treatment plan related to  self management and patient's adherence to plan as established by provider   Heart Failure Interventions:  (Status:  Goal Met.) Short Term Goal Assessed for any changes in condition- pt pleased to share he  is doing well except for some frequent urination-taking Lasix and Flomax-  pt will call to discuss with provider  Diabetes Interventions:  (Status:  Goal Met.) Short Term Goal Reviewed and dicussed pt's recent cbgs readings and values -Nutrition assessment done-pt continues to voice good appetite   Patient Goals/Self-Care Activities: Participate in Transition of Care Program/Attend TOC scheduled calls Take all medications as prescribed Attend all scheduled provider appointments Perform all self care activities independently  Call provider office for new concerns or questions  use salt in moderation Maintain safety in the home-report no falls/injuries check blood sugar at prescribed times: once daily take the blood sugar log to all doctor visits  Follow Up Plan:  The patient has been provided with contact information for the care management team and has been advised to call with any health related questions or concerns.  Patient has completed 30-day TOC program.         Plan: The patient has been provided with contact information for the care management team and has been advised to call with any health related questions or concerns.  No further follow up needed at this time.  Antionette Fairy, RN,BSN,CCM RN Care Manager Transitions of Care  [place]  Direct Phone: [phone] Toll Free: [phone] Fax: [phone]

## 2023-02-18 ENCOUNTER — Ambulatory Visit (INDEPENDENT_AMBULATORY_CARE_PROVIDER_SITE_OTHER): Payer: Medicare Other | Admitting: Family Medicine

## 2023-02-18 VITALS — BP 138/64 | HR 80 | Temp 98.6°F | Ht 65.0 in | Wt 194.0 lb

## 2023-02-18 DIAGNOSIS — Z794 Long term (current) use of insulin: Secondary | ICD-10-CM

## 2023-02-18 DIAGNOSIS — R300 Dysuria: Secondary | ICD-10-CM

## 2023-02-18 DIAGNOSIS — N1831 Chronic kidney disease, stage 3a: Secondary | ICD-10-CM

## 2023-02-18 DIAGNOSIS — R350 Frequency of micturition: Secondary | ICD-10-CM

## 2023-02-18 DIAGNOSIS — E1121 Type 2 diabetes mellitus with diabetic nephropathy: Secondary | ICD-10-CM

## 2023-02-18 DIAGNOSIS — N3000 Acute cystitis without hematuria: Secondary | ICD-10-CM | POA: Diagnosis not present

## 2023-02-18 DIAGNOSIS — E119 Type 2 diabetes mellitus without complications: Secondary | ICD-10-CM

## 2023-02-18 LAB — POCT URINALYSIS DIPSTICK
Bilirubin, UA: NEGATIVE
Glucose, UA: POSITIVE — AB
Ketones, UA: NEGATIVE
Nitrite, UA: NEGATIVE
Protein, UA: NEGATIVE
Spec Grav, UA: 1.01 — AB (ref 1.010–1.025)
Urobilinogen, UA: NEGATIVE U/dL — AB
pH, UA: 6 (ref 5.0–8.0)

## 2023-02-18 MED ORDER — AMOXICILLIN-POT CLAVULANATE 875-125 MG PO TABS: 1.0000 | ORAL_TABLET | Freq: Two times a day (BID) | ORAL | 0 refills | Status: DC

## 2023-02-18 NOTE — Patient Instructions (Signed)
Please follow up as scheduled for your next visit with me: 05/09/2023   If you have any questions or concerns, please don't hesitate to send me a message via MyChart or call the office at [phone]. Thank you for visiting with [place] today! It's our pleasure caring for you.   VISIT SUMMARY:  During today's visit, we discussed your recent symptoms of frequent urination and a burning sensation during urination. We also reviewed your diabetes management and general health maintenance.  YOUR PLAN:  -URINARY TRACT INFECTION (UTI): A UTI is an infection in any part of your urinary system. You reported symptoms like frequent urination and a burning sensation. We have prescribed an antibiotic to treat the infection and sent your urine for a culture test. If the culture test is negative, we may consider treating for a yeast infection.  -DIABETES MELLITUS: Diabetes Mellitus is a condition that affects how your body processes blood sugar. Your blood sugar levels have been higher than usual, likely due to the infection. We recommend continuing your current insulin regimen and monitoring your blood glucose levels. Your blood sugar is expected to normalize once the infection is treated.  -GENERAL HEALTH MAINTENANCE: Your hemoglobin and kidney function are satisfactory. No additional interventions are required at this time. Continue with routine monitoring of these parameters.  INSTRUCTIONS:  Please follow up to review the urine culture results. We may adjust your antibiotic treatment based on the culture results.

## 2023-02-18 NOTE — Progress Notes (Signed)
Subjective  CC:  Chief Complaint  Patient presents with   Urinary Frequency    Pt stated that he has been experiencing frequent urination and burning for the past 2weeks    HPI: [name] is a 89 y.o. male who presents to the office today to address the problems listed above in the chief complaint. Discussed the use of AI scribe software for clinical note transcription with the patient, who gave verbal consent to proceed.  History of Present Illness   The patient, with a history of diabetes, presents with a chief complaint of frequent urination and a burning sensation during urination for the past two weeks. He reports urinating at least a dozen times a day and waking up five to six times during the night to urinate. The patient describes the urine as clear and denies any blood in the urine, redness on the head of the penis, discharge, swelling, or soreness. He also denies any abdominal pain, fevers, chills, or nausea. The patient's blood sugar levels have been higher than usual.  I reviewed his sugars, he had 1 very elevated reading at 195 but most fastings are in the low 100s which is a little bit high for him.  Chart review shows history of an E. coli infection in 2022. And remote history of Enterococcus infection.  He is on an SGLT2 inhibitor.  He has not had any problems with yeast balanitis.  He feels otherwise well, without abdominal pain or flank pain.     Assessment  1. Acute cystitis without hematuria   2. Frequent urination   3. Burning with urination   4. Type 2 diabetes mellitus with diabetic nephropathy, with long-term current use of insulin (HCC)   5. Insulin-requiring or dependent type II diabetes mellitus (HCC)   6. CKD stage 3a, GFR 45-59 ml/min (HCC)      Plan  Assessment and Plan    Urinary Tract Infection (UTI) Reports dysuria and increased urinary frequency over the past two weeks. No hematuria, cloudy urine, foul odor, fever, chills, or abdominal  pain. Physical exam reveals no penile redness, discharge, or swelling. Differential includes UTI and possible yeast infection secondary to Jardiance use. Plan to treat empirically for UTI and send urine for culture. If culture is negative, consider treatment for yeast infection. Antibiotic expected to alleviate burning sensation and improve sleep quality. - Prescribe antibiotic for UTI, O-Lac Augmentin 875 twice daily. - Send urine for culture - Consider treatment for yeast infection if culture is negative  Diabetes Mellitus Blood glucose levels elevated, likely secondary to infection. No changes in insulin regimen recommended. Blood glucose expected to normalize post-infection treatment. Explained infection can cause temporary increases in blood glucose levels. - Continue current insulin regimen - Monitor blood glucose levels  General Health Maintenance Hemoglobin and kidney function are satisfactory. No additional interventions required. I reviewed his recent lab results from the [place].  They will be scanned to the chart.  Chronic anemia and chronic kidney disease remained stable.  Lipids are stable. - Continue routine monitoring of hemoglobin and kidney function  Follow-up as scheduled with me in March - Review urine culture results - Adjust antibiotic treatment if necessary based on culture results.        Orders Placed This Encounter  Procedures   Urine Culture   POCT Urinalysis Dipstick   Meds ordered this encounter  Medications   amoxicillin-clavulanate (AUGMENTIN) 875-125 MG tablet    Sig: Take 1 tablet by mouth 2 (two) times  daily.    Dispense:  14 tablet    Refill:  0     I reviewed the patients updated PMH, FH, and SocHx.    Patient Active Problem List   Diagnosis Date Noted   History of lower GI bleeding 06/10/2022    Priority: High   Mild cognitive impairment 03/05/2021    Priority: High   Moderate nonproliferative diabetic retinopathy of left eye (HCC) 06/18/2020     Priority: High   CKD stage 3a, GFR 45-59 ml/min (HCC) 05/26/2020    Priority: High   Aortic stenosis, moderate 05/22/2019    Priority: High   Chronic heart failure with preserved ejection fraction (HFpEF) (HCC) 03/14/2019    Priority: High   Spinal stenosis of lumbar region 02/21/2018    Priority: High   Diabetic peripheral neuropathy associated with type 2 diabetes mellitus (HCC) 05/05/2017    Priority: High   Atherosclerosis of native coronary artery of native heart without angina pectoris 06/13/2016    Priority: High   CVA (cerebral vascular accident) (HCC) 06/06/2016    Priority: High   Recurrent coronary arteriosclerosis after percutaneous transluminal coronary angioplasty 04/18/2015    Priority: High   CAD S/P PCI- Nov 2016     Priority: High   Chronic anticoagulation 02/06/2013    Priority: High   Chronic atrial fibrillation (HCC) 07/30/2011    Priority: High   Hx of CABG x 2 1990     Priority: High   Hypertension associated with diabetes (HCC)     Priority: High   Diabetes mellitus with diabetic nephropathy, with long-term current use of insulin (HCC)     Priority: High   Combined hyperlipidemia associated with type 2 diabetes mellitus (HCC)     Priority: High   Thoracic aortic aneurysm without rupture (HCC) 05/26/2020    Priority: Medium    Degeneration of lumbar intervertebral disc 02/21/2018    Priority: Medium    Chronic vertigo 03/15/2014    Priority: Medium    Obesity (BMI 30.0-34.9) 01/31/2012    Priority: Medium    Enlarged prostate without lower urinary tract symptoms (luts) 01/04/2011    Priority: Medium    Gastro-esophageal reflux disease without esophagitis 12/07/2010    Priority: Medium    Hiatal hernia 05/05/2022    Priority: Low   Osteoarthritis of metacarpophalangeal (MCP) joint of left thumb 01/18/2022    Priority: Low   Stable hemispheric central retinal vein occlusion (CRVO) of right eye 07/21/2020    Priority: Low   Lower extremity  edema 03/14/2019    Priority: Low   Epistaxis, recurrent 04/11/2015    Priority: Low   Osteoarthritis, knee 01/31/2012    Priority: Low   Allergic rhinitis 10/28/2011    Priority: Low   Hearing loss 06/08/2011    Priority: Low   Primary osteoarthritis of hand 06/08/2011    Priority: Low   Benign hematuria 08/27/2008    Priority: Low   Atrial fibrillation with RVR (HCC) 01/10/2023   Sepsis without acute organ dysfunction (HCC) 01/10/2023   Pelvic fluid collection 01/10/2023   Nonrheumatic aortic valve stenosis 01/10/2023   Long term (current) use of anticoagulants 01/10/2023   Sepsis due to pneumonia (HCC) 01/09/2023   Hyponatremia 01/09/2023   Fluid collection at surgical site 01/09/2023   Multifocal pneumonia 06/02/2016   Current Meds  Medication Sig   amoxicillin-clavulanate (AUGMENTIN) 875-125 MG tablet Take 1 tablet by mouth 2 (two) times daily.   apixaban (ELIQUIS) 5 MG TABS tablet Take 1 tablet (  5 mg total) by mouth 2 (two) times daily.   atorvastatin (LIPITOR) 40 MG tablet Take 1 tablet (40 mg total) by mouth at bedtime.   AVODART 0.5 MG capsule Take 0.5 mg by mouth every other day.   Cholecalciferol (VITAMIN D3 PO) Take 50 mcg by mouth daily.   diltiazem (CARDIZEM CD) 120 MG 24 hr capsule Take 1 capsule (120 mg total) by mouth daily.   empagliflozin (JARDIANCE) 25 MG TABS tablet Take 1 tablet (25 mg total) by mouth daily before breakfast.   furosemide (LASIX) 40 MG tablet TAKE 1 TABLET(40 MG) BY MOUTH DAILY (Patient taking differently: Take 40 mg by mouth in the morning.)   gabapentin (NEURONTIN) 300 MG capsule Take 2 capsules (600 mg total) by mouth at bedtime.   insulin glargine (LANTUS) 100 UNIT/ML injection Inject 35 Units into the skin every morning.   iron polysaccharides (NIFEREX) 150 MG capsule Take 1 capsule (150 mg total) by mouth daily.   meloxicam (MOBIC) 7.5 MG tablet Take 7.5 mg by mouth daily as needed.   metFORMIN (GLUCOPHAGE) 500 MG tablet Take 500 mg by  mouth 2 (two) times daily with a meal.   methocarbamol (ROBAXIN) 500 MG tablet Take 1,000 mg by mouth 3 (three) times daily as needed.   metoprolol tartrate (LOPRESSOR) 50 MG tablet Take 1 tablet (50 mg total) by mouth 2 (two) times daily.   pantoprazole (PROTONIX) 40 MG tablet TAKE 1 TABLET BY MOUTH DAILY AT NOON (Patient taking differently: Take 40 mg by mouth daily at 12 noon.)   polyethylene glycol (MIRALAX / GLYCOLAX) 17 g packet Take 17 g by mouth daily.   tamsulosin (FLOMAX) 0.4 MG CAPS capsule Take 0.4 mg by mouth daily.   traMADol (ULTRAM) 50 MG tablet Take 1 tablet (50 mg total) by mouth 2 (two) times daily as needed.   TYLENOL 500 MG tablet Take 500-1,000 mg by mouth every 6 (six) hours as needed for mild pain or headache.    Allergies: Patient is allergic to septra [sulfamethoxazole-trimethoprim], cefadroxil, ciprofloxacin, and erythromycin. Family History: Patient family history includes Brain cancer in his father; Throat cancer in his brother. Social History:  Patient  reports that he quit smoking about 64 years ago. His smoking use included cigarettes. He started smoking about 74 years ago. He has a 30 pack-year smoking history. He has never used smokeless tobacco. He reports that he does not drink alcohol and does not use drugs.  Review of Systems: Constitutional: Negative for fever malaise or anorexia Cardiovascular: negative for chest pain Respiratory: negative for SOB or persistent cough Gastrointestinal: negative for abdominal pain  Objective  Vitals: BP 138/64   Pulse 80   Temp 98.6 F (37 C)   Ht 5\' 5"  (1.651 m)   Wt 194 lb (88 kg)   SpO2 97%   BMI 32.28 kg/m  General: no acute distress , A&Ox3 Well-appearing Abdomen: Soft without suprapubic tenderness, no CVA tenderness  No visits with results within 1 Day(s) from this visit.  Latest known visit with results is:  Office Visit on 02/07/2023  Component Date Value Ref Range Status   Hemoglobin A1C  02/07/2023 7.0 (A)  4.0 - 5.6 % Final   Office Visit on 02/18/2023  Component Date Value Ref Range Status   Color, UA 02/18/2023 yellow   Final   Clarity, UA 02/18/2023 clear   Final   Glucose, UA 02/18/2023 Positive (A)  Negative Final   Bilirubin, UA 02/18/2023 negative   Final  Ketones, UA 02/18/2023 negative   Final   Spec Grav, UA 02/18/2023 1.010 (A)  1.010 - 1.025 Final   Blood, UA 02/18/2023 1+ (A)   Final   pH, UA 02/18/2023 6.0  5.0 - 8.0 Final   Protein, UA 02/18/2023 Negative  Negative Final   Urobilinogen, UA 02/18/2023 negative (A)  0.2 or 1.0 E.U./dL Final   Nitrite, UA 40/98/1191 negative   Final   Leukocytes, UA 02/18/2023 Moderate (2+) (A)  Negative Final     Commons side effects, risks, benefits, and alternatives for medications and treatment plan prescribed today were discussed, and the patient expressed understanding of the given instructions. Patient is instructed to call or message via MyChart if he/she has any questions or concerns regarding our treatment plan. No barriers to understanding were identified. We discussed Red Flag symptoms and signs in detail. Patient expressed understanding regarding what to do in case of urgent or emergency type symptoms.  Medication list was reconciled, printed and provided to the patient in AVS. Patient instructions and summary information was reviewed with the patient as documented in the AVS. This note was prepared with assistance of Dragon voice recognition software. Occasional wrong-word or sound-a-like substitutions may have occurred due to the inherent limitations of voice recognition software

## 2023-02-21 LAB — URINE CULTURE
MICRO NUMBER:: 15877067
SPECIMEN QUALITY:: ADEQUATE

## 2023-02-24 ENCOUNTER — Encounter: Payer: Self-pay | Admitting: Family Medicine

## 2023-02-24 MED ORDER — FLUCONAZOLE 150 MG PO TABS: ORAL_TABLET | ORAL | 0 refills | Status: DC

## 2023-02-24 NOTE — Progress Notes (Signed)
Please call patient: there is a patient message that he is still having symptoms.  Please let him know that his urine does not show an infection; Please tell him I want him to take the medication for a possible yeast infection that I have ordered. (He is on jardiance and this can make yeast infections likely). If he is not improving after 3 more days, I will need to recheck him in the office. Please get him scheduled for follow up next Monday or Tuesday.

## 2023-02-24 NOTE — Addendum Note (Signed)
Addended by: Asencion Partridge on: 02/24/2023 09:06 AM   Modules accepted: Orders

## 2023-02-24 NOTE — Progress Notes (Signed)
[name] made aware of Dr Mardelle Matte and Dr Myrtie Cruise recommendation

## 2023-02-24 NOTE — Telephone Encounter (Signed)
Randa Evens made aware of Dr Myrtie Cruise recommendation and expressed clear understanding.

## 2023-02-24 NOTE — Telephone Encounter (Signed)
[name] would like to know should the pt continue taking the Amoxicillin as prescribed while taking Diflucan? Pt started taking Amoxicillin on 02/18/23 and is taking twice a day, however pt started developing yeast infection sxs, Diflucan has been called to the pt's pharmacy, and [name] would like clarity if pt should discontinue the Amoxicillin or continue taking while taking Diflucan. Pt to take Amoxicillin for 14 days. Thank You

## 2023-02-24 NOTE — Telephone Encounter (Signed)
See new note on newest message

## 2023-03-14 ENCOUNTER — Encounter: Payer: Self-pay | Admitting: Family Medicine

## 2023-03-15 ENCOUNTER — Ambulatory Visit: Payer: Self-pay | Admitting: Family Medicine

## 2023-03-15 NOTE — Telephone Encounter (Signed)
  Chief Complaint: urinary pain Symptoms: urinary pain, urinary frequency Frequency: 3 days Pertinent Negatives: Patient denies fever, bloody in urine Disposition: [] ED /[] Urgent Care (no appt availability in office) / [x] Appointment(In office/virtual)/ []  [facility]/ [] Home Care/ [] Refused Recommended Disposition /[] [facility]/ []  Follow-up with PCP Additional Notes: Patient reports that he has been experiencing urinary pain and frequency for the last 3 days. Patient reports he was diagnosed with a UTI a few weeks ago and prescribed medication, but he believes the UTI has come back. Per protocol, pt scheduled for in office appt tomorrow 1/15. Patient advised to call back with worsening symptoms. Patient verbalized understanding.      Copied from CRM [id number]. Topic: Clinical - Pink Word Triage >> Mar 15, 2023  9:42 AM Tiffany H wrote: Reason for Triage: Pink word: PAIN. Patient's daughter Elijah called to request another round of Diflucan  150mg . Please assist.   Patient is drinking lots of water but yesterday, he got up six times during the evening to pass water. And every time, the experience was more painful. Patient was previously taking Diflucan  but the infection has returned, seemingly. Please assist. See message via MyChart. Reason for Disposition  All other males with painful urination  Answer Assessment - Initial Assessment Questions 1. SEVERITY: How bad is the pain?  (e.g., Scale 1-10; mild, moderate, or severe)   - MILD (1-3): Complains slightly about urination hurting.   - MODERATE (4-7): Interferes with normal activities.     - SEVERE (8-10): Excruciating, unwilling or unable to urinate because of the pain.      moderate 2. FREQUENCY: How many times have you had painful urination today?      6x last night 3. PATTERN: Is pain present every time you urinate or just sometimes?      Every time 4. ONSET: When did the painful urination start?       This happened a few weeks ago and has been coming back the last 3 days 5. FEVER: Do you have a fever? If Yes, ask: What is your temperature, how was it measured, and when did it start?     none 6. PAST UTI: Have you had a urine infection before? If Yes, ask: When was the last time? and What happened that time?      Yes, a few weeks ago 7. CAUSE: What do you think is causing the painful urination?      UTI 8. OTHER SYMPTOMS: Do you have any other symptoms? (e.g., flank pain, penis discharge, scrotal pain, blood in urine)     none  Protocols used: Urination Pain - Male-A-AH

## 2023-03-15 NOTE — Telephone Encounter (Signed)
 FYI Pt scheduled for OV 03/16/23

## 2023-03-16 ENCOUNTER — Ambulatory Visit (INDEPENDENT_AMBULATORY_CARE_PROVIDER_SITE_OTHER): Payer: Medicare Other | Admitting: Family Medicine

## 2023-03-16 ENCOUNTER — Encounter: Payer: Self-pay | Admitting: Podiatry

## 2023-03-16 ENCOUNTER — Ambulatory Visit (INDEPENDENT_AMBULATORY_CARE_PROVIDER_SITE_OTHER): Payer: Medicare Other | Admitting: Podiatry

## 2023-03-16 VITALS — BP 138/86 | HR 68 | Temp 97.9°F | Ht 65.0 in | Wt 196.0 lb

## 2023-03-16 DIAGNOSIS — B351 Tinea unguium: Secondary | ICD-10-CM | POA: Diagnosis not present

## 2023-03-16 DIAGNOSIS — M79674 Pain in right toe(s): Secondary | ICD-10-CM

## 2023-03-16 DIAGNOSIS — B3741 Candidal cystitis and urethritis: Secondary | ICD-10-CM

## 2023-03-16 DIAGNOSIS — R350 Frequency of micturition: Secondary | ICD-10-CM

## 2023-03-16 DIAGNOSIS — E1142 Type 2 diabetes mellitus with diabetic polyneuropathy: Secondary | ICD-10-CM

## 2023-03-16 LAB — POCT URINALYSIS DIPSTICK
Bilirubin, UA: NEGATIVE
Blood, UA: 1 — AB
Glucose, UA: POSITIVE — AB
Ketones, UA: NEGATIVE
Nitrite, UA: NEGATIVE
Protein, UA: NEGATIVE
Spec Grav, UA: 1.015 (ref 1.010–1.025)
Urobilinogen, UA: 0.2 U/dL
pH, UA: 6 (ref 5.0–8.0)

## 2023-03-16 MED ORDER — FLUCONAZOLE 150 MG PO TABS: ORAL_TABLET | ORAL | 0 refills | Status: DC

## 2023-03-16 NOTE — Telephone Encounter (Signed)
 Pt being seen today in office. cla

## 2023-03-16 NOTE — Progress Notes (Signed)
 Subjective  CC:  Chief Complaint  Patient presents with   Urinary Frequency    Pt stated that he has been experiencing some frequent urination and some burning thst has been going on for about a week    HPI: [name] is a 89 y.o. male who presents to the office today to address the problems listed above in the chief complaint. Pt with dysuria and urinary freqency x about 5 - 7 days. Just like before: see last note. Resolved with diflucan , urine culture as negative last visit. On jardiance  and lasix . No systemic sxs. Sugars are stable. No hyperglycemia.    Assessment  1. Candidal urethritis   2. Frequent urination      Plan  Likely candidal urethritis:  diflucan . Await urine culture (+dipstick today). Hold lasix  x 48 hours and jardiance  x 2 weeks. Restart if sxs abate, but use wet wipes after voiding. IF recurs, will need to change off of [id number]. Pt understands and agrees.   Follow up: as scheduled. 04/13/2023  Orders Placed This Encounter  Procedures   Urine Culture   POCT Urinalysis Dipstick   No orders of the defined types were placed in this encounter.     I reviewed the patients updated PMH, FH, and SocHx.    Patient Active Problem List   Diagnosis Date Noted   History of lower GI bleeding 06/10/2022    Priority: High   Mild cognitive impairment 03/05/2021    Priority: High   Moderate nonproliferative diabetic retinopathy of left eye (HCC) 06/18/2020    Priority: High   CKD stage 3a, GFR 45-59 ml/min (HCC) 05/26/2020    Priority: High   Aortic stenosis, moderate 05/22/2019    Priority: High   Chronic heart failure with preserved ejection fraction (HFpEF) (HCC) 03/14/2019    Priority: High   Spinal stenosis of lumbar region 02/21/2018    Priority: High   Diabetic peripheral neuropathy associated with type 2 diabetes mellitus (HCC) 05/05/2017    Priority: High   Atherosclerosis of native coronary artery of native heart without angina pectoris  06/13/2016    Priority: High   CVA (cerebral vascular accident) (HCC) 06/06/2016    Priority: High   Recurrent coronary arteriosclerosis after percutaneous transluminal coronary angioplasty 04/18/2015    Priority: High   CAD S/P PCI- Nov 2016     Priority: High   Chronic anticoagulation 02/06/2013    Priority: High   Chronic atrial fibrillation (HCC) 07/30/2011    Priority: High   Hx of CABG x 2 1990     Priority: High   Hypertension associated with diabetes (HCC)     Priority: High   Diabetes mellitus with diabetic nephropathy, with long-term current use of insulin  (HCC)     Priority: High   Combined hyperlipidemia associated with type 2 diabetes mellitus (HCC)     Priority: High   Thoracic aortic aneurysm without rupture (HCC) 05/26/2020    Priority: Medium    Degeneration of lumbar intervertebral disc 02/21/2018    Priority: Medium    Chronic vertigo 03/15/2014    Priority: Medium    Obesity (BMI 30.0-34.9) 01/31/2012    Priority: Medium    Enlarged prostate without lower urinary tract symptoms (luts) 01/04/2011    Priority: Medium    Gastro-esophageal reflux disease without esophagitis 12/07/2010    Priority: Medium    Hiatal hernia 05/05/2022    Priority: Low   Osteoarthritis of metacarpophalangeal (MCP) joint of left thumb 01/18/2022  Priority: Low   Stable hemispheric central retinal vein occlusion (CRVO) of right eye 07/21/2020    Priority: Low   Lower extremity edema 03/14/2019    Priority: Low   Epistaxis, recurrent 04/11/2015    Priority: Low   Osteoarthritis, knee 01/31/2012    Priority: Low   Allergic rhinitis 10/28/2011    Priority: Low   Hearing loss 06/08/2011    Priority: Low   Primary osteoarthritis of hand 06/08/2011    Priority: Low   Benign hematuria 08/27/2008    Priority: Low   Atrial fibrillation with RVR (HCC) 01/10/2023   Sepsis without acute organ dysfunction (HCC) 01/10/2023   Pelvic fluid collection 01/10/2023   Nonrheumatic aortic  valve stenosis 01/10/2023   Long term (current) use of anticoagulants 01/10/2023   Sepsis due to pneumonia (HCC) 01/09/2023   Hyponatremia 01/09/2023   Fluid collection at surgical site 01/09/2023   Multifocal pneumonia 06/02/2016   Current Meds  Medication Sig   apixaban  (ELIQUIS ) 5 MG TABS tablet Take 1 tablet (5 mg total) by mouth 2 (two) times daily.   atorvastatin  (LIPITOR) 40 MG tablet Take 1 tablet (40 mg total) by mouth at bedtime.   AVODART  0.5 MG capsule Take 0.5 mg by mouth every other day.   Cholecalciferol  (VITAMIN D3 PO) Take 50 mcg by mouth daily.   diltiazem  (CARDIZEM  CD) 120 MG 24 hr capsule Take 1 capsule (120 mg total) by mouth daily.   empagliflozin  (JARDIANCE ) 25 MG TABS tablet Take 1 tablet (25 mg total) by mouth daily before breakfast.   fluconazole  (DIFLUCAN ) 150 MG tablet Take one tablet today; may repeat in 3 days if symptoms persist   furosemide  (LASIX ) 40 MG tablet TAKE 1 TABLET(40 MG) BY MOUTH DAILY (Patient taking differently: Take 40 mg by mouth in the morning.)   gabapentin  (NEURONTIN ) 300 MG capsule Take 2 capsules (600 mg total) by mouth at bedtime.   insulin  glargine (LANTUS ) 100 UNIT/ML injection Inject 35 Units into the skin every morning.   iron  polysaccharides (NIFEREX) 150 MG capsule Take 1 capsule (150 mg total) by mouth daily.   meloxicam (MOBIC) 7.5 MG tablet Take 7.5 mg by mouth daily as needed.   metFORMIN  (GLUCOPHAGE ) 500 MG tablet Take 500 mg by mouth 2 (two) times daily with a meal.   methocarbamol  (ROBAXIN ) 500 MG tablet Take 1,000 mg by mouth 3 (three) times daily as needed.   metoprolol  tartrate (LOPRESSOR ) 50 MG tablet Take 1 tablet (50 mg total) by mouth 2 (two) times daily.   pantoprazole  (PROTONIX ) 40 MG tablet TAKE 1 TABLET BY MOUTH DAILY AT NOON (Patient taking differently: Take 40 mg by mouth daily at 12 noon.)   polyethylene glycol (MIRALAX  / GLYCOLAX ) 17 g packet Take 17 g by mouth daily.   tamsulosin  (FLOMAX ) 0.4 MG CAPS capsule  Take 0.4 mg by mouth daily.   traMADol  (ULTRAM ) 50 MG tablet Take 1 tablet (50 mg total) by mouth 2 (two) times daily as needed.   TYLENOL  500 MG tablet Take 500-1,000 mg by mouth every 6 (six) hours as needed for mild pain or headache.   [DISCONTINUED] amoxicillin -clavulanate (AUGMENTIN ) 875-125 MG tablet Take 1 tablet by mouth 2 (two) times daily.    Allergies: Patient is allergic to septra  [sulfamethoxazole -trimethoprim ], cefadroxil, ciprofloxacin, and erythromycin. Family History: Patient family history includes Brain cancer in his father; Throat cancer in his brother. Social History:  Patient  reports that he quit smoking about 64 years ago. His smoking use included cigarettes. He  started smoking about 74 years ago. He has a 30 pack-year smoking history. He has never used smokeless tobacco. He reports that he does not drink alcohol and does not use drugs.  Review of Systems: Constitutional: Negative for fever malaise or anorexia Cardiovascular: negative for chest pain Respiratory: negative for SOB or persistent cough Gastrointestinal: negative for abdominal pain  Objective  Vitals: BP 138/86   Pulse 68   Temp 97.9 F (36.6 C)   Ht 5\' 5"  (1.651 m)   Wt 196 lb (88.9 kg)   SpO2 98%   BMI 32.62 kg/m  General: no acute distress , A&Ox3 GU: retracted penile shaft, foreskin clear, minimal redness of visible urethra  Commons side effects, risks, benefits, and alternatives for medications and treatment plan prescribed today were discussed, and the patient expressed understanding of the given instructions. Patient is instructed to call or message via MyChart if he/she has any questions or concerns regarding our treatment plan. No barriers to understanding were identified. We discussed Red Flag symptoms and signs in detail. Patient expressed understanding regarding what to do in case of urgent or emergency type symptoms.  Medication list was reconciled, printed and provided to the patient  in AVS. Patient instructions and summary information was reviewed with the patient as documented in the AVS. This note was prepared with assistance of Dragon voice recognition software. Occasional wrong-word or sound-a-like substitutions may have occurred due to the inherent limitations of voice recognition software

## 2023-03-16 NOTE — Patient Instructions (Signed)
 Please follow up as scheduled for your next visit with me: 05/09/2023   If you have any questions or concerns, please don't hesitate to send me a message via MyChart or call the office at [phone]. Thank you for visiting with us  today! It's our pleasure caring for you.   Take one diflucan  to see if that resolves your symptoms again.  I will check your urine for infection again.   Hold your Lasix  for 24 - 48 hours to see if that helps with the frequent urination.   Hold your Jardiance  for 2 weeks, then restart if all of your symptoms have resolved.

## 2023-03-16 NOTE — Progress Notes (Signed)
This patient returns to my office for at risk foot care.  This patient requires this care by a professional since this patient will be at risk due to having diabetic neuropathy and coagulation defect.  Patient is taking eliquiss.  Patient says toenails are painful walking and wearing his shoes.  This patient presents for at risk foot care today. ? ?General Appearance  Alert, conversant and in no acute stress. ? ?Vascular  Dorsalis pedis and posterior tibial  pulses are weakly  palpable  bilaterally.  Capillary return is within normal limits  bilaterally. Temperature is within normal limits  bilaterally. ? ?Neurologic  Senn-Weinstein monofilament wire test within normal limits  bilaterally. Muscle power within normal limits bilaterally. ? ?Nails Thick disfigured discolored nails with subungual debris  from hallux to fifth toes bilaterally. No evidence of bacterial infection or drainage bilaterally. ? ?Orthopedic  No limitations of motion  feet .  No crepitus or effusions noted.  No bony pathology or digital deformities noted. ? ?Skin  normotropic skin with no porokeratosis noted bilaterally.  No signs of infections or ulcers noted.    ? ?Onychomycosis  B/L ? ?Consent was obtained for treatment procedures.   Debridement of nails with nail nipper followed by dremel tool. Filed with dremel without incident.  ? ? ?Return office visit    10   weeks                Told patient to return for periodic foot care and evaluation due to potential at risk complications. ? ? ?Helane Gunther DPM  ?

## 2023-03-17 LAB — URINE CULTURE
MICRO NUMBER:: 15958910
SPECIMEN QUALITY:: ADEQUATE

## 2023-03-18 NOTE — Progress Notes (Signed)
See mychart note Dear Mr. Lachance, Again, there is no urinary tract infection. I'm hoping the yeast medication clears up your symptoms.  Sincerely, Dr. Mardelle Matte

## 2023-03-25 ENCOUNTER — Encounter: Payer: Self-pay | Admitting: Family Medicine

## 2023-03-25 ENCOUNTER — Ambulatory Visit (INDEPENDENT_AMBULATORY_CARE_PROVIDER_SITE_OTHER): Payer: Medicare Other | Admitting: Family Medicine

## 2023-03-25 VITALS — BP 163/68 | HR 82 | Temp 98.2°F | Ht 65.0 in | Wt 197.2 lb

## 2023-03-25 DIAGNOSIS — D508 Other iron deficiency anemias: Secondary | ICD-10-CM

## 2023-03-25 DIAGNOSIS — B3741 Candidal cystitis and urethritis: Secondary | ICD-10-CM | POA: Diagnosis not present

## 2023-03-25 DIAGNOSIS — N1831 Chronic kidney disease, stage 3a: Secondary | ICD-10-CM

## 2023-03-25 DIAGNOSIS — N400 Enlarged prostate without lower urinary tract symptoms: Secondary | ICD-10-CM | POA: Diagnosis not present

## 2023-03-25 MED ORDER — POLYSACCHARIDE IRON COMPLEX 150 MG PO CAPS: 150.0000 mg | ORAL_CAPSULE | Freq: Every day | ORAL | 3 refills | Status: DC

## 2023-03-25 NOTE — Progress Notes (Unsigned)
[name]

## 2023-03-30 DIAGNOSIS — M1812 Unilateral primary osteoarthritis of first carpometacarpal joint, left hand: Secondary | ICD-10-CM | POA: Diagnosis not present

## 2023-04-04 ENCOUNTER — Emergency Department (HOSPITAL_COMMUNITY)
Admission: EM | Admit: 2023-04-04 | Discharge: 2023-04-04 | Disposition: A | Discharge status: Home / Self Care | Payer: Medicare Other | Attending: Emergency Medicine | Admitting: Emergency Medicine

## 2023-04-04 ENCOUNTER — Emergency Department (HOSPITAL_COMMUNITY): Payer: Medicare Other

## 2023-04-04 ENCOUNTER — Encounter (HOSPITAL_COMMUNITY): Payer: Self-pay

## 2023-04-04 ENCOUNTER — Other Ambulatory Visit: Payer: Self-pay

## 2023-04-04 DIAGNOSIS — R062 Wheezing: Secondary | ICD-10-CM | POA: Insufficient documentation

## 2023-04-04 DIAGNOSIS — I10 Essential (primary) hypertension: Secondary | ICD-10-CM | POA: Insufficient documentation

## 2023-04-04 DIAGNOSIS — Z7984 Long term (current) use of oral hypoglycemic drugs: Secondary | ICD-10-CM | POA: Insufficient documentation

## 2023-04-04 DIAGNOSIS — R079 Chest pain, unspecified: Secondary | ICD-10-CM | POA: Insufficient documentation

## 2023-04-04 DIAGNOSIS — Z951 Presence of aortocoronary bypass graft: Secondary | ICD-10-CM | POA: Diagnosis not present

## 2023-04-04 DIAGNOSIS — J984 Other disorders of lung: Secondary | ICD-10-CM | POA: Diagnosis not present

## 2023-04-04 DIAGNOSIS — Z79899 Other long term (current) drug therapy: Secondary | ICD-10-CM | POA: Insufficient documentation

## 2023-04-04 DIAGNOSIS — I350 Nonrheumatic aortic (valve) stenosis: Secondary | ICD-10-CM | POA: Insufficient documentation

## 2023-04-04 DIAGNOSIS — I4891 Unspecified atrial fibrillation: Secondary | ICD-10-CM | POA: Insufficient documentation

## 2023-04-04 DIAGNOSIS — Z794 Long term (current) use of insulin: Secondary | ICD-10-CM | POA: Diagnosis not present

## 2023-04-04 DIAGNOSIS — R0789 Other chest pain: Secondary | ICD-10-CM | POA: Diagnosis not present

## 2023-04-04 DIAGNOSIS — G3184 Mild cognitive impairment, so stated: Secondary | ICD-10-CM | POA: Diagnosis not present

## 2023-04-04 DIAGNOSIS — Z20822 Contact with and (suspected) exposure to covid-19: Secondary | ICD-10-CM | POA: Insufficient documentation

## 2023-04-04 DIAGNOSIS — Z7901 Long term (current) use of anticoagulants: Secondary | ICD-10-CM | POA: Diagnosis not present

## 2023-04-04 DIAGNOSIS — Z8673 Personal history of transient ischemic attack (TIA), and cerebral infarction without residual deficits: Secondary | ICD-10-CM | POA: Diagnosis not present

## 2023-04-04 DIAGNOSIS — R0602 Shortness of breath: Secondary | ICD-10-CM | POA: Insufficient documentation

## 2023-04-04 DIAGNOSIS — E119 Type 2 diabetes mellitus without complications: Secondary | ICD-10-CM | POA: Insufficient documentation

## 2023-04-04 DIAGNOSIS — J189 Pneumonia, unspecified organism: Secondary | ICD-10-CM | POA: Diagnosis not present

## 2023-04-04 DIAGNOSIS — R918 Other nonspecific abnormal finding of lung field: Secondary | ICD-10-CM | POA: Diagnosis not present

## 2023-04-04 LAB — CBC
HCT: 34.3 % — ABNORMAL LOW (ref 39.0–52.0)
Hemoglobin: 9.9 g/dL — ABNORMAL LOW (ref 13.0–17.0)
MCH: 22.3 pg — ABNORMAL LOW (ref 26.0–34.0)
MCHC: 28.9 g/dL — ABNORMAL LOW (ref 30.0–36.0)
MCV: 77.3 fL — ABNORMAL LOW (ref 80.0–100.0)
Platelets: 285 10*3/uL (ref 150–400)
RBC: 4.44 MIL/uL (ref 4.22–5.81)
RDW: 18.5 % — ABNORMAL HIGH (ref 11.5–15.5)
WBC: 7.8 10*3/uL (ref 4.0–10.5)
nRBC: 0 % (ref 0.0–0.2)

## 2023-04-04 LAB — BASIC METABOLIC PANEL
Anion gap: 9 (ref 5–15)
BUN: 15 mg/dL (ref 8–23)
CO2: 26 mmol/L (ref 22–32)
Calcium: 8.9 mg/dL (ref 8.9–10.3)
Chloride: 106 mmol/L (ref 98–111)
Creatinine, Ser: 1.12 mg/dL (ref 0.61–1.24)
GFR, Estimated: >60 mL/min (ref >60)
Glucose, Bld: 84 mg/dL (ref 70–99)
Potassium: 3.6 mmol/L (ref 3.5–5.1)
Sodium: 141 mmol/L (ref 135–145)

## 2023-04-04 LAB — TROPONIN I (HIGH SENSITIVITY)
Troponin I (High Sensitivity): 12 ng/L (ref <18)
Troponin I (High Sensitivity): 12 ng/L (ref <18)

## 2023-04-04 LAB — RESP PANEL BY RT-PCR (RSV, FLU A&B, COVID)  RVPGX2
Influenza A by PCR: NEGATIVE
Influenza B by PCR: NEGATIVE
Resp Syncytial Virus by PCR: NEGATIVE
SARS Coronavirus 2 by RT PCR: NEGATIVE

## 2023-04-04 LAB — BRAIN NATRIURETIC PEPTIDE: B Natriuretic Peptide: 255.1 pg/mL — ABNORMAL HIGH (ref 0.0–100.0)

## 2023-04-04 MED ORDER — ALBUTEROL SULFATE HFA 108 (90 BASE) MCG/ACT IN AERS: 1.0000 | INHALATION_SPRAY | Freq: Four times a day (QID) | RESPIRATORY_TRACT | 0 refills | Status: DC | PRN

## 2023-04-04 MED ORDER — IPRATROPIUM-ALBUTEROL 0.5-2.5 (3) MG/3ML IN SOLN
3.0000 mL | Freq: Once | RESPIRATORY_TRACT | Status: AC
  Administered 2023-04-04: 3 mL via RESPIRATORY_TRACT
  Filled 2023-04-04: qty 3

## 2023-04-04 NOTE — ED Triage Notes (Signed)
Pt arrived from home via [facility] c/o chest pain described as tightness awoke him. 7/10. [facility] adm ASA 324mg . Pt states that at this moment there is no chest pain or tightness.

## 2023-04-04 NOTE — Discharge Instructions (Signed)
Please follow-up with a cardiologist in regards recent ER visit.  Today your labs and imaging are reassuring and at this time you are safe to be discharged.  Please take your medications as prescribed if symptoms change or worsen please return to the ER.

## 2023-04-04 NOTE — ED Provider Notes (Signed)
[place] EMERGENCY DEPARTMENT AT [facility] Provider Note   CSN: [id number] Arrival date & time: 04/04/23  4098     History  Chief Complaint  Patient presents with   Chest Pain    [name] is a 89 y.o. male history of moderate aortic stenosis, hypertension with diabetes, mild cognitive impairment, chronic A-fib on Eliquis daily, CVA, thoracic aortic aneurysm without rupture, CABG x 2 presenting with chest pain and shortness of breath that began at 5 AM this morning.  Patient got up to go to the bathroom and was walking back when he noticed left-sided chest discomfort radiating to his left arm that lasted for few moments and ultimately resolved.  Patient did not Dors mild shortness of breath with this however again this was momentarily.  Patient called EMS and was given full dose aspirin and states he has not had any symptoms since then.  Patient denies any respiratory history or recent illnesses or sick contacts or cough.  Patient denies leg swelling.  Last echo: 05/02/2022 LVEF 60-65%, mild aortic valve regurg with severe aortic valve stenosis  Home Medications Prior to Admission medications   Medication Sig Start Date End Date Taking? Authorizing Provider  albuterol (VENTOLIN HFA) 108 (90 Base) MCG/ACT inhaler Inhale 1-2 puffs into the lungs every 6 (six) hours as needed for wheezing or shortness of breath. 04/04/23  Yes TRUE Shackleford, Beverly Gust, [name]  apixaban (ELIQUIS) 5 MG TABS tablet Take 1 tablet (5 mg total) by mouth 2 (two) times daily. 05/09/22  Yes Sheikh, Omair Latif, DO  atorvastatin (LIPITOR) 40 MG tablet Take 1 tablet (40 mg total) by mouth at bedtime. 08/16/22  Yes [name], MD  AVODART 0.5 MG capsule Take 0.5 mg by mouth every other day. 07/20/11  Yes [provider]  Cholecalciferol (VITAMIN D3 PO) Take 50 mcg by mouth daily.   Yes [provider]  diltiazem (CARDIZEM CD) 120 MG 24 hr capsule Take 1 capsule (120 mg total) by mouth  daily. 01/15/23  Yes Rolly Salter, MD  empagliflozin (JARDIANCE) 25 MG TABS tablet Take 1 tablet (25 mg total) by mouth daily before breakfast. 09/03/20  Yes [place], Peter M, MD  furosemide (LASIX) 40 MG tablet TAKE 1 TABLET(40 MG) BY MOUTH DAILY Patient taking differently: Take 40 mg by mouth in the morning. 08/31/22  Yes [place], Peter M, MD  gabapentin (NEURONTIN) 300 MG capsule Take 2 capsules (600 mg total) by mouth at bedtime. 08/16/22  Yes [name], MD  insulin glargine (LANTUS) 100 UNIT/ML injection Inject 30 Units into the skin every morning.   Yes [provider]  iron polysaccharides (NIFEREX) 150 MG capsule Take 1 capsule (150 mg total) by mouth daily. 03/25/23  Yes [name], MD  metFORMIN (GLUCOPHAGE) 500 MG tablet Take 500 mg by mouth 2 (two) times daily with a meal.   Yes [provider]  metoprolol tartrate (LOPRESSOR) 50 MG tablet Take 1 tablet (50 mg total) by mouth 2 (two) times daily. 07/15/22  Yes [place], Peter M, MD  pantoprazole (PROTONIX) 40 MG tablet TAKE 1 TABLET BY MOUTH DAILY AT NOON Patient taking differently: Take 40 mg by mouth daily at 12 noon. 06/03/22  Yes Monge, Petra Kuba, NP  traMADol (ULTRAM) 50 MG tablet Take 1 tablet (50 mg total) by mouth 2 (two) times daily as needed. Patient taking differently: Take 50 mg by mouth 2 (two) times daily as needed for moderate pain (pain score 4-6). 12/07/22  Yes [name], MD  TYLENOL 500 MG tablet Take 500-1,000 mg by mouth every 6 (six) hours as needed for mild pain or headache.   Yes [provider]  fluconazole (DIFLUCAN) 150 MG tablet Take one tablet today; may repeat in 3 days if symptoms persist Patient not taking: Reported on 04/04/2023 03/16/23   [name], MD  meloxicam (MOBIC) 7.5 MG tablet Take 7.5 mg by mouth daily as needed for pain. 01/07/23   [provider]  polyethylene glycol (MIRALAX / GLYCOLAX) 17 g packet Take 17 g by mouth daily.    [provider]       Allergies    Septra [sulfamethoxazole-trimethoprim], Cefadroxil, Ciprofloxacin, and Erythromycin    Review of Systems   Review of Systems  Cardiovascular:  Positive for chest pain.    Physical Exam Updated Vital Signs BP 138/80 (BP Location: Right Arm)   Pulse 94   Temp 97.8 F (36.6 C) (Oral)   Resp 18   Ht 5\' 5"  (1.651 m)   Wt 89.4 kg   SpO2 100%   BMI 32.80 kg/m  Physical Exam Vitals reviewed.  Constitutional:      General: He is not in acute distress. HENT:     Head: Normocephalic and atraumatic.  Eyes:     Extraocular Movements: Extraocular movements intact.     Conjunctiva/sclera: Conjunctivae normal.     Pupils: Pupils are equal, round, and reactive to light.  Cardiovascular:     Rate and Rhythm: Normal rate and regular rhythm.     Pulses: Normal pulses.     Heart sounds: Murmur heard.     Systolic murmur is present with a grade of 3/6.     Comments: 2+ bilateral radial/dorsalis pedis pulses with regular rate Pulmonary:     Effort: Pulmonary effort is normal. No respiratory distress.     Breath sounds: Normal breath sounds.     Comments: Able to speak in full sentences Abdominal:     Palpations: Abdomen is soft.     Tenderness: There is no abdominal tenderness. There is no guarding or rebound.  Musculoskeletal:        General: Normal range of motion.     Cervical back: Normal range of motion and neck supple.     Right lower leg: No tenderness. No edema.     Left lower leg: No tenderness. No edema.     Comments: 5 out of 5 bilateral grip/leg extension strength  Skin:    General: Skin is warm and dry.     Capillary Refill: Capillary refill takes less than 2 seconds.  Neurological:     General: No focal deficit present.     Mental Status: He is alert and oriented to person, place, and time.     Comments: Sensation intact in all 4 limbs  Psychiatric:        Mood and Affect: Mood normal.     ED Results / Procedures / Treatments   Labs (all labs  ordered are listed, but only abnormal results are displayed) Labs Reviewed  CBC - Abnormal; Notable for the following components:      Result Value   Hemoglobin 9.9 (*)    HCT 34.3 (*)    MCV 77.3 (*)    MCH 22.3 (*)    MCHC 28.9 (*)    RDW 18.5 (*)    All other components within normal limits  BRAIN NATRIURETIC PEPTIDE - Abnormal; Notable for the following components:  B Natriuretic Peptide 255.1 (*)    All other components within normal limits  RESP PANEL BY RT-PCR (RSV, FLU A&B, COVID)  RVPGX2  BASIC METABOLIC PANEL  TROPONIN I (HIGH SENSITIVITY)  TROPONIN I (HIGH SENSITIVITY)    EKG None  Radiology DG Chest Port 1 View Result Date: 04/04/2023 CLINICAL DATA:  Chest pain EXAM: PORTABLE CHEST 1 VIEW COMPARISON:  01/12/2023 FINDINGS: Interval improvement in left lower lung airspace disease seen previously with persistent patchy left basilar airspace opacity on the current study. No overt pulmonary edema or substantial pleural effusion. Likely underlying background chronic interstitial lung disease. The cardio pericardial silhouette is enlarged. No acute bony abnormality. Telemetry leads overlie the chest. IMPRESSION: 1. Interval improvement in left lower lung airspace disease with persistent patchy left basilar airspace opacity. 2. Likely underlying background chronic interstitial lung disease. Electronically Signed   By: Kennith Center M.D.   On: 04/04/2023 07:23    Procedures Ultrasound ED Echo  Date/Time: 04/04/2023 8:27 AM  Performed by: Netta Corrigan, [name] Authorized by: Netta Corrigan, [name]   Procedure details:    Indications: chest pain     Views: subxiphoid, parasternal long axis view, parasternal short axis view, apical 4 chamber view and IVC view     Images: archived     Limitations:  Increased thoracic air, positioning and acoustic shadowing Findings:    Pericardium: no pericardial effusion     LV Function: normal (>50% EF)     RV Diameter: normal     IVC:  normal   Impression:    Impression: normal     Impression comment:  Aortic stenosis noted     Medications Ordered in ED Medications  ipratropium-albuterol (DUONEB) 0.5-2.5 (3) MG/3ML nebulizer solution 3 mL (3 mLs Nebulization Given 04/04/23 1610)    ED Course/ Medical Decision Making/ A&P             HEART Score: 7                    Medical Decision Making Amount and/or Complexity of Data Reviewed Labs: ordered. Radiology: ordered.  Risk Prescription drug management.   Pasha Broad Oehlert 89 y.o. presented today for chest pain. Working DDx that I considered at this time includes, but not limited to, ACS, pulmonary embolism, community-acquired pneumonia, aortic dissection, pneumothorax, underlying bony abnormality, anemia, thyrotoxicosis, HTN urgency/emergency, esophageal rupture, CHF exacerbation, valvular disorder, myocarditis, pericarditis, endocarditis, pericardial effusion/cardiac tamponade, pulmonary edema, gastritis/PUD/GERD, esophagitis, MSK.  R/o Dx: ACS, pulmonary embolism, community-acquired pneumonia, aortic dissection, pneumothorax, underlying bony abnormality, anemia, thyrotoxicosis, HTN urgency/emergency, esophageal rupture, CHF exacerbation, valvular disorder, myocarditis, pericarditis, endocarditis, pericardial effusion/cardiac tamponade, pulmonary edema, gastritis/PUD/GERD, esophagitis, MSK: These are considered less likely due to history of present illness and physical exam findings. Aortic Dissection: less likely based on the location, quality, onset, and severity of symptoms in this case. Patient also has a lack of underlying history of AD or TAA.   Review of prior external notes: 03/30/2023 office visit  Unique Tests and My Interpretation:  EKG: Rate, rhythm, axis, intervals all examined and without medically relevant abnormality. ST segments without concerns for elevations Troponin: 12, 12 CXR: Improvement in left basilar opacity from previous CBC:  unremarkable BMP: unremarkable Respiratory panel: unremarkable BNP: unremarkable  Social Determinants of Health: none  Discussion with Independent Historian:  Wife and son  Discussion of Management of Tests: None  Risk: Medium: prescription drug management  Risk Stratification Score: HEART Score: 7  Staffed with Jeraldine Loots, MD  Plan: On exam patient was in no acute distress with stable. Patient's physical was remarkable for wheezing on the right side. Labs and CXR will be ordered.  The cardiac monitor was ordered secondary to the patient's history of chest pain and to monitor the patient for dysrhythmia. The cardiac monitor revealed A-fib as interpreted by me.  Respiratory panel along with BNP were ordered however patient does not appear fluid overloaded.  Patient stable at this time.  Chest x-ray does show improvement in left basilar opacity.  Bedside echo does show aortic stenosis no other acute findings were found.  Patient's labs are reassuring including delta troponin.  Patient is high risk and has a heart score son and I offered admission however patient declines he states he wants to go home.  Will send patient home with albuterol as he did have wheezing earlier that is now resolved and do suspect this was causing his chest pain earlier.  Strongly encourage patient to follow-up with his cardiologist and take his medications as prescribed.  Patient has been taking his Eliquis daily and is not endorsing any symptoms at this time and with the wheezing do feel this is more reactive airway disease and does not need advanced imaging for PE as he does not have any leg swelling or calf tenderness either along with being compliant with his Eliquis.  Patient agrees with this.  Patient was given return precautions. Patient stable for discharge at this time.  Patient verbalized understanding of plan.  This chart was dictated using voice recognition software.  Despite best efforts to proofread,   errors can occur which can change the documentation meaning.        Final Clinical Impression(s) / ED Diagnoses Final diagnoses:  Chest pain, unspecified type    Rx / DC Orders ED Discharge Orders          Ordered    albuterol (VENTOLIN HFA) 108 (90 Base) MCG/ACT inhaler  Every 6 hours PRN        04/04/23 1010              Remi Deter 04/04/23 1015    Gerhard Munch, MD 04/04/23 1510

## 2023-04-06 ENCOUNTER — Telehealth: Payer: Self-pay

## 2023-04-06 NOTE — Transitions of Care (Post Inpatient/ED Visit) (Signed)
 04/06/2023  Name: [name] MRN: [id number] DOB: Jul 09, 1928  Today's TOC FU Call Status: Today's TOC FU Call Status:: Successful TOC FU Call Completed TOC FU Call Complete Date: 04/06/23 Patient's Name and Date of Birth confirmed.  Transition Care Management Follow-up Telephone Call Date of Discharge: 04/04/23 Discharge Facility: [facility]) Type of Discharge: Emergency Department Reason for ED Visit: Cardiac Conditions Cardiac Conditions Diagnosis: Chest Pain Persisting How have you been since you were released from the hospital?: Worse (pt states he was feeling better but he is now having dizziness pt got up to use the restroom during the night, became dizzy, and fell.) Any questions or concerns?: Yes Patient Questions/Concerns:: he is very dizzy now. Had a fall due to experiencing dizziness when he got up to use the restroom during the night. Patient Questions/Concerns Addressed: Notified Provider of Patient Questions/Concerns  Items Reviewed: Did you receive and understand the discharge instructions provided?: Yes Medications obtained,verified, and reconciled?: Yes (Medications Reviewed) Any new allergies since your discharge?: No Dietary orders reviewed?: NA Do you have support at home?: Yes  Medications Reviewed Today: Medications Reviewed Today     Reviewed by Takasha Vetere, [place] LABOR, CMA (Certified Medical Assistant) on 04/06/23 at 1152  Med List Status: <None>   Medication Order Taking? Sig Documenting Provider Last Dose Status Informant  albuterol  (VENTOLIN  HFA) 108 (90 Base) MCG/ACT inhaler [id number]  Inhale 1-2 puffs into the lungs every 6 (six) hours as needed for wheezing or shortness of breath. Victor Lynwood DASEN, [name]  Active   apixaban  (ELIQUIS ) 5 MG TABS tablet [id number] No Take 1 tablet (5 mg total) by mouth 2 (two) times daily. Sherrill [address], [place] 04/03/2023  6:00 PM Active Self, Pharmacy Records  atorvastatin  (LIPITOR) 40 MG tablet [id number] No Take  1 tablet (40 mg total) by mouth at bedtime. Jodie Lavern CROME, MD 04/03/2023 Active Self, Pharmacy Records  AVODART  0.5 MG capsule [id number] No Take 0.5 mg by mouth every other day. [provider] 04/03/2023 Active Self, Pharmacy Records  Cholecalciferol  (VITAMIN D3 PO) [id number] No Take 50 mcg by mouth daily. [provider] 04/03/2023 Active Self, Pharmacy Records  diltiazem  (CARDIZEM  CD) 120 MG 24 hr capsule [id number] No Take 1 capsule (120 mg total) by mouth daily. Tobie Yetta HERO, MD 04/03/2023 Active Self, Pharmacy Records  empagliflozin  (JARDIANCE ) 25 MG TABS tablet [id number] No Take 1 tablet (25 mg total) by mouth daily before breakfast. Jordan, Peter M, MD 04/03/2023 Active Self, Pharmacy Records  fluconazole  (DIFLUCAN ) 150 MG tablet [id number] No Take one tablet today; may repeat in 3 days if symptoms persist  Patient not taking: Reported on 04/04/2023   Jodie Lavern CROME, MD Not Taking Active Self, Pharmacy Records  furosemide  (LASIX ) 40 MG tablet [id number] No TAKE 1 TABLET(40 MG) BY MOUTH DAILY  Patient taking differently: Take 40 mg by mouth in the morning.   Jordan, Peter M, MD 04/03/2023 Active Self, Pharmacy Records  gabapentin  (NEURONTIN ) 300 MG capsule [id number] No Take 2 capsules (600 mg total) by mouth at bedtime. Jodie Lavern CROME, MD 04/03/2023 Active Self, Pharmacy Records  insulin  glargine (LANTUS ) 100 UNIT/ML injection [id number] No Inject 30 Units into the skin every morning. [provider] 04/03/2023 Active Self, Pharmacy Records  iron  polysaccharides (NIFEREX) 150 MG capsule [id number] No Take 1 capsule (150 mg total) by mouth daily. Jodie Lavern CROME, MD 04/03/2023 Active Self, Pharmacy Records  meloxicam  (MOBIC ) 7.5 MG tablet [id number] No Take 7.5 mg by mouth daily as needed for  pain. [provider] Taking Active Self, Pharmacy Records           Med Note CARLEEN, [facility]   Mon Apr 04, 2023  8:04 AM) unknown  metFORMIN  (GLUCOPHAGE ) 500 MG tablet [id number] No  Take 500 mg by mouth 2 (two) times daily with a meal. [provider] 04/03/2023 Active Self, Pharmacy Records  metoprolol  tartrate (LOPRESSOR ) 50 MG tablet [id number] No Take 1 tablet (50 mg total) by mouth 2 (two) times daily. Jordan, Peter M, MD 04/03/2023 Active Self, Pharmacy Records  pantoprazole  (PROTONIX ) 40 MG tablet [id number] No TAKE 1 TABLET BY MOUTH DAILY AT NOON  Patient taking differently: Take 40 mg by mouth daily at 12 noon.   Daneen Damien BROCKS, NP 04/03/2023 Active Self, Pharmacy Records  polyethylene glycol (MIRALAX  / GLYCOLAX ) 17 g packet [id number] No Take 17 g by mouth daily. [provider] Taking Active Self, Pharmacy Records           Med Note CARLEEN, [facility]   Mon Apr 04, 2023  8:07 AM) unknown  traMADol  (ULTRAM ) 50 MG tablet [id number] No Take 1 tablet (50 mg total) by mouth 2 (two) times daily as needed.  Patient taking differently: Take 50 mg by mouth 2 (two) times daily as needed for moderate pain (pain score 4-6).   Jodie Lavern CROME, MD Past Month Active Self, Pharmacy Records  TYLENOL  500 MG tablet [id number] No Take 500-1,000 mg by mouth every 6 (six) hours as needed for mild pain or headache. [provider] 04/03/2023 Active Self, Pharmacy Records            Home Care and Equipment/Supplies: Were Home Health Services Ordered?: NA Any new equipment or medical supplies ordered?: NA  Functional Questionnaire: Do you need assistance with bathing/showering or dressing?: No Do you need assistance with meal preparation?: No Do you need assistance with eating?: No Do you have difficulty maintaining continence: No Do you need assistance with getting out of bed/getting out of a chair/moving?: No Do you have difficulty managing or taking your medications?: No  Follow up appointments reviewed: PCP Follow-up appointment confirmed?: Yes Date of PCP follow-up appointment?: 04/07/23 Follow-up Provider: Dr. Lavern Jodie [facility]  Follow-up appointment confirmed?: NA Do you need transportation to your follow-up appointment?: No Do you understand care options if your condition(s) worsen?: Yes-patient verbalized understanding    Masaji Billups, CMA  [facility] AWV Team Direct Dial: [phone]

## 2023-04-07 ENCOUNTER — Encounter: Payer: Self-pay | Admitting: Family Medicine

## 2023-04-07 ENCOUNTER — Ambulatory Visit (INDEPENDENT_AMBULATORY_CARE_PROVIDER_SITE_OTHER): Payer: Medicare Other | Admitting: Family Medicine

## 2023-04-07 ENCOUNTER — Ambulatory Visit: Payer: Medicare Other

## 2023-04-07 VITALS — BP 136/79 | HR 81 | Temp 98.1°F | Ht 65.0 in | Wt 179.0 lb

## 2023-04-07 DIAGNOSIS — W19XXXA Unspecified fall, initial encounter: Secondary | ICD-10-CM

## 2023-04-07 DIAGNOSIS — R1031 Right lower quadrant pain: Secondary | ICD-10-CM

## 2023-04-07 DIAGNOSIS — R42 Dizziness and giddiness: Secondary | ICD-10-CM | POA: Diagnosis not present

## 2023-04-07 DIAGNOSIS — M25551 Pain in right hip: Secondary | ICD-10-CM | POA: Diagnosis not present

## 2023-04-07 DIAGNOSIS — Z96641 Presence of right artificial hip joint: Secondary | ICD-10-CM | POA: Diagnosis not present

## 2023-04-07 MED ORDER — MECLIZINE HCL 25 MG PO TABS: 12.5000 mg | ORAL_TABLET | Freq: Two times a day (BID) | ORAL | 0 refills | Status: DC | PRN

## 2023-04-07 NOTE — Progress Notes (Signed)
 See mychart note Dear Mr. Upchurch, Your hip xray does not show any fracture; I am hopeful that with some time, your pain will improve.  Sincerely, Dr. Jonelle Neri

## 2023-04-07 NOTE — Progress Notes (Signed)
 Subjective  CC:  Chief Complaint  Patient presents with   Dizziness    Pt fell on Tuesday and hurt his groin on right side. Pt having dizziness most of the time.     HPI: [name] is a 89 y.o. male who presents to the office today to address the problems listed above in the chief complaint. Discussed the use of AI scribe software for clinical note transcription with the patient, who gave verbal consent to proceed.  History of Present Illness   [name] is a 89 year old male who presents with dizziness and hip pain following a fall. He is accompanied by his grandson, Dominic.  Two days ago, he experienced dizziness that led to a fall while getting up to go to the bathroom at around 3 AM. The dizziness has improved but persists, especially with head movements, though he feels stable when sitting still. He has been prescribed meclizine  in the past for dizziness, which he refers to as 'magic pills'.  Following the fall, he has significant pain in the groin area, described as sore and affecting his ability to walk. He can bear weight on the affected side, but it causes pain. There is no bruising in the groin area, but he mentions bruises on his arm from a previous hospital stay. He does not recall twisting his leg during the fall and notes that the pain is primarily when he gets up and puts weight on it.  No abdominal pain, chest pain, or loss of consciousness during the fall. His arms and legs are functioning well aside from the injured area. He has not experienced any head trauma or changes in his thinking.     Of note, he had an ER visit prior to that reviewed all notes.  Was for acute chest pain that have resolved.  He has had no further episodes since his that visit.  He has chronic CAD, diabetes, hypertension and A-fib.  Assessment  1. Chronic vertigo   2. Fall, initial encounter   3. Right groin pain      Plan  Assessment and Plan    Fall with  Groin Pain Fell two days ago while going to the bathroom at night. Reports pain in the groin area, especially when weight-bearing. No bruising noted. Pain seems to be improving. -Order hip x-ray to rule out fracture. -Advise rest and gradual weight-bearing as tolerated.  Vertigo Reports dizziness, especially with movement. Symptoms seem to be improving. -Resume Meclizine  as needed for dizziness, with caution due to potential drowsiness and risk of falls. -Advise to avoid sudden movements and to rise slowly from a seated or lying position. Follow-up in March as scheduled for diabetes follow-up       Orders Placed This Encounter  Procedures   DG HIP UNILAT W OR W/O PELVIS 2-3 VIEWS RIGHT   Meds ordered this encounter  Medications   meclizine  (ANTIVERT ) 25 MG tablet    Sig: Take 0.5-1 tablets (12.5-25 mg total) by mouth 2 (two) times daily as needed for dizziness.    Dispense:  30 tablet    Refill:  0     I reviewed the patients updated PMH, FH, and SocHx.    Patient Active Problem List   Diagnosis Date Noted   History of lower GI bleeding 06/10/2022    Priority: High   Mild cognitive impairment 03/05/2021    Priority: High   Moderate nonproliferative diabetic retinopathy of left eye (HCC) 06/18/2020  Priority: High   CKD stage 3a, GFR 45-59 ml/min (HCC) 05/26/2020    Priority: High   Aortic stenosis, moderate 05/22/2019    Priority: High   Chronic heart failure with preserved ejection fraction (HFpEF) (HCC) 03/14/2019    Priority: High   Spinal stenosis of lumbar region 02/21/2018    Priority: High   Diabetic peripheral neuropathy associated with type 2 diabetes mellitus (HCC) 05/05/2017    Priority: High   Atherosclerosis of native coronary artery of native heart without angina pectoris 06/13/2016    Priority: High   CVA (cerebral vascular accident) (HCC) 06/06/2016    Priority: High   Recurrent coronary arteriosclerosis after percutaneous transluminal coronary  angioplasty 04/18/2015    Priority: High   CAD S/P PCI- Nov 2016     Priority: High   Chronic anticoagulation 02/06/2013    Priority: High   Chronic atrial fibrillation (HCC) 07/30/2011    Priority: High   Hx of CABG x 2 1990     Priority: High   Hypertension associated with diabetes (HCC)     Priority: High   Diabetes mellitus with diabetic nephropathy, with long-term current use of insulin  (HCC)     Priority: High   Combined hyperlipidemia associated with type 2 diabetes mellitus (HCC)     Priority: High   Thoracic aortic aneurysm without rupture (HCC) 05/26/2020    Priority: Medium    Degeneration of lumbar intervertebral disc 02/21/2018    Priority: Medium    Chronic vertigo 03/15/2014    Priority: Medium    Obesity (BMI 30.0-34.9) 01/31/2012    Priority: Medium    Enlarged prostate without lower urinary tract symptoms (luts) 01/04/2011    Priority: Medium    Gastro-esophageal reflux disease without esophagitis 12/07/2010    Priority: Medium    Hiatal hernia 05/05/2022    Priority: Low   Osteoarthritis of metacarpophalangeal (MCP) joint of left thumb 01/18/2022    Priority: Low   Stable hemispheric central retinal vein occlusion (CRVO) of right eye 07/21/2020    Priority: Low   Lower extremity edema 03/14/2019    Priority: Low   Epistaxis, recurrent 04/11/2015    Priority: Low   Osteoarthritis, knee 01/31/2012    Priority: Low   Allergic rhinitis 10/28/2011    Priority: Low   Hearing loss 06/08/2011    Priority: Low   Primary osteoarthritis of hand 06/08/2011    Priority: Low   Benign hematuria 08/27/2008    Priority: Low   Current Meds  Medication Sig   albuterol  (VENTOLIN  HFA) 108 (90 Base) MCG/ACT inhaler Inhale 1-2 puffs into the lungs every 6 (six) hours as needed for wheezing or shortness of breath.   apixaban  (ELIQUIS ) 5 MG TABS tablet Take 1 tablet (5 mg total) by mouth 2 (two) times daily.   atorvastatin  (LIPITOR) 40 MG tablet Take 1 tablet (40 mg  total) by mouth at bedtime.   AVODART  0.5 MG capsule Take 0.5 mg by mouth every other day.   Cholecalciferol  (VITAMIN D3 PO) Take 50 mcg by mouth daily.   diltiazem  (CARDIZEM  CD) 120 MG 24 hr capsule Take 1 capsule (120 mg total) by mouth daily.   empagliflozin  (JARDIANCE ) 25 MG TABS tablet Take 1 tablet (25 mg total) by mouth daily before breakfast.   furosemide  (LASIX ) 40 MG tablet TAKE 1 TABLET(40 MG) BY MOUTH DAILY (Patient taking differently: Take 40 mg by mouth in the morning.)   gabapentin  (NEURONTIN ) 300 MG capsule Take 2 capsules (600 mg total) by  mouth at bedtime.   insulin  glargine (LANTUS ) 100 UNIT/ML injection Inject 30 Units into the skin every morning.   iron  polysaccharides (NIFEREX) 150 MG capsule Take 1 capsule (150 mg total) by mouth daily.   meclizine  (ANTIVERT ) 25 MG tablet Take 0.5-1 tablets (12.5-25 mg total) by mouth 2 (two) times daily as needed for dizziness.   meloxicam  (MOBIC ) 7.5 MG tablet Take 7.5 mg by mouth daily as needed for pain.   metFORMIN  (GLUCOPHAGE ) 500 MG tablet Take 500 mg by mouth 2 (two) times daily with a meal.   metoprolol  tartrate (LOPRESSOR ) 50 MG tablet Take 1 tablet (50 mg total) by mouth 2 (two) times daily.   pantoprazole  (PROTONIX ) 40 MG tablet TAKE 1 TABLET BY MOUTH DAILY AT NOON (Patient taking differently: Take 40 mg by mouth daily at 12 noon.)   polyethylene glycol (MIRALAX  / GLYCOLAX ) 17 g packet Take 17 g by mouth daily.   traMADol  (ULTRAM ) 50 MG tablet Take 1 tablet (50 mg total) by mouth 2 (two) times daily as needed. (Patient taking differently: Take 50 mg by mouth 2 (two) times daily as needed for moderate pain (pain score 4-6).)   TYLENOL  500 MG tablet Take 500-1,000 mg by mouth every 6 (six) hours as needed for mild pain or headache.    Allergies: Patient is allergic to septra  [sulfamethoxazole -trimethoprim ], cefadroxil, ciprofloxacin, and erythromycin. Family History: Patient family history includes Brain cancer in his father;  Throat cancer in his brother. Social History:  Patient  reports that he quit smoking about 64 years ago. His smoking use included cigarettes. He started smoking about 74 years ago. He has a 30 pack-year smoking history. He has never used smokeless tobacco. He reports that he does not drink alcohol and does not use drugs.  Review of Systems: Constitutional: Negative for fever malaise or anorexia Cardiovascular: negative for chest pain Respiratory: negative for SOB or persistent cough Gastrointestinal: negative for abdominal pain  Objective  Vitals: BP 136/79   Pulse 81   Temp 98.1 F (36.7 C)   Ht 5' 5 (1.651 m)   Wt 179 lb (81.2 kg)   SpO2 96%   BMI 29.79 kg/m  General: no acute distress , A&Ox3. HEENT: PEERL, conjunctiva normal, neck is supple Cardiovascular: Irregularly irregular Sitting in wheelchair: Right groin mildly tender, can raise leg unassisted.  Good strength.  No knee tenderness.  No lower extremity swelling.  No bruising.  Commons side effects, risks, benefits, and alternatives for medications and treatment plan prescribed today were discussed, and the patient expressed understanding of the given instructions. Patient is instructed to call or message via MyChart if he/she has any questions or concerns regarding our treatment plan. No barriers to understanding were identified. We discussed Red Flag symptoms and signs in detail. Patient expressed understanding regarding what to do in case of urgent or emergency type symptoms.  Medication list was reconciled, printed and provided to the patient in AVS. Patient instructions and summary information was reviewed with the patient as documented in the AVS. This note was prepared with assistance of Dragon voice recognition software. Occasional wrong-word or sound-a-like substitutions may have occurred due to the inherent limitations of voice recognition software

## 2023-04-07 NOTE — Patient Instructions (Signed)
 Please follow up as scheduled for your next visit with me: 05/09/2023   If you have any questions or concerns, please don't hesitate to send me a message via MyChart or call the office at [phone]. Thank you for visiting with us  today! It's our pleasure caring for you.   VISIT SUMMARY:  Today, you visited us  due to dizziness and hip pain following a fall. You experienced dizziness two days ago, which led to a fall while getting up to go to the bathroom. The dizziness has improved but still occurs with head movements. You also have significant pain in your groin area, especially when you put weight on it. There is no bruising in the groin area, but you have bruises on your arm from a previous hospital stay. You do not recall twisting your leg during the fall, and the pain is primarily when you get up and put weight on it. You have not experienced any head trauma or changes in your thinking.  YOUR PLAN:  -FALL WITH GROIN PAIN: A fall can cause injuries such as bruises or fractures. You have pain in your groin area, especially when you put weight on it. We will order a hip x-ray to rule out any fractures. In the meantime, rest and gradually put weight on your leg as you can tolerate.  -VERTIGO: Vertigo is a sensation of dizziness or spinning, often caused by inner ear problems. Your dizziness has improved but still occurs with head movements. You can resume taking Meclizine  as needed for dizziness, but be cautious as it can cause drowsiness and increase the risk of falls. Avoid sudden movements and rise slowly from sitting or lying down.  INSTRUCTIONS:  Please follow up with us  on 04/13/2023 for your scheduled appointment, likely for diabetes management.

## 2023-04-11 ENCOUNTER — Other Ambulatory Visit: Payer: Self-pay

## 2023-04-11 ENCOUNTER — Telehealth: Payer: Self-pay | Admitting: Family Medicine

## 2023-04-11 MED ORDER — GABAPENTIN 300 MG PO CAPS: 600.0000 mg | ORAL_CAPSULE | Freq: Every day | ORAL | 5 refills | Status: AC

## 2023-04-11 NOTE — Telephone Encounter (Signed)
 Patient called in stating he needs refill of gabapentin  but pharmacy is stating that they need approval from PCP before they are able to fill the medication. Patient is also requesting call from PCP nurse, Devra Fontana.

## 2023-04-11 NOTE — Telephone Encounter (Signed)
 Rx sent.

## 2023-04-11 NOTE — Telephone Encounter (Signed)
 Patient wanted me to check to make sure PCP is aware the pharmacy is needing a prior auth for his Gabapentin .. I told him I would send a message to make sure.  Seen the message previously was received and sent.  Thank you  [facility] [employer] Direct Dial [phone]

## 2023-04-13 ENCOUNTER — Ambulatory Visit (INDEPENDENT_AMBULATORY_CARE_PROVIDER_SITE_OTHER): Payer: Medicare Other

## 2023-04-13 VITALS — Wt 170.0 lb

## 2023-04-13 DIAGNOSIS — Z0000 Encounter for general adult medical examination without abnormal findings: Secondary | ICD-10-CM

## 2023-04-13 NOTE — Progress Notes (Addendum)
 Subjective:   [name] is a 89 y.o. male who presents for Medicare Annual/Subsequent preventive examination.  Visit Complete: Virtual I connected with  [name] on 04/13/23 by a audio enabled telemedicine application and verified that I am speaking with the correct person using two identifiers.  Patient Location: Home  Provider Location: Home Office  I discussed the limitations of evaluation and management by telemedicine. The patient expressed understanding and agreed to proceed.  Vital Signs: Because this visit was a virtual/telehealth visit, some criteria may be missing or patient reported. Any vitals not documented were not able to be obtained and vitals that have been documented are patient reported.  Cardiac Risk Factors include: advanced age (>19men, >66 women);diabetes mellitus;dyslipidemia;male gender;hypertension     Objective:    Today's Vitals   04/13/23 1028  Weight: 170 lb (77.1 kg)   Body mass index is 28.29 kg/m.     04/13/2023   10:32 AM 04/04/2023    6:34 AM 01/09/2023    5:25 PM 10/20/2022    4:37 AM 10/13/2022   11:24 PM 05/02/2022    5:00 AM 05/01/2022   11:05 PM  Advanced Directives  Does Patient Have a Medical Advance Directive? Yes No Yes No Yes Yes Yes  Type of Estate agent of [place];Living will  Healthcare Power of [employer] Power of Attorney Living will;Healthcare Power of Attorney Living will   Does patient want to make changes to medical advance directive?   No - Patient declined   No - Patient declined No - Patient declined  Copy of Healthcare Power of Attorney in Chart? No - copy requested   No - copy requested No - copy available, Physician notified    Would patient like information on creating a medical advance directive? No - Patient declined No - Patient declined  No - Patient declined       Current Medications (verified) Outpatient Encounter Medications as of 04/13/2023  Medication  Sig   albuterol (VENTOLIN HFA) 108 (90 Base) MCG/ACT inhaler Inhale 1-2 puffs into the lungs every 6 (six) hours as needed for wheezing or shortness of breath.   apixaban (ELIQUIS) 5 MG TABS tablet Take 1 tablet (5 mg total) by mouth 2 (two) times daily.   atorvastatin (LIPITOR) 40 MG tablet Take 1 tablet (40 mg total) by mouth at bedtime.   AVODART 0.5 MG capsule Take 0.5 mg by mouth every other day.   Cholecalciferol (VITAMIN D3 PO) Take 50 mcg by mouth daily.   diltiazem (CARDIZEM CD) 120 MG 24 hr capsule Take 1 capsule (120 mg total) by mouth daily.   empagliflozin (JARDIANCE) 25 MG TABS tablet Take 1 tablet (25 mg total) by mouth daily before breakfast.   furosemide (LASIX) 40 MG tablet TAKE 1 TABLET(40 MG) BY MOUTH DAILY (Patient taking differently: Take 40 mg by mouth in the morning.)   gabapentin (NEURONTIN) 300 MG capsule Take 2 capsules (600 mg total) by mouth at bedtime.   insulin glargine (LANTUS) 100 UNIT/ML injection Inject 30 Units into the skin every morning.   iron polysaccharides (NIFEREX) 150 MG capsule Take 1 capsule (150 mg total) by mouth daily.   meclizine (ANTIVERT) 25 MG tablet Take 0.5-1 tablets (12.5-25 mg total) by mouth 2 (two) times daily as needed for dizziness.   meloxicam (MOBIC) 7.5 MG tablet Take 7.5 mg by mouth daily as needed for pain.   metFORMIN (GLUCOPHAGE) 500 MG tablet Take 500 mg by mouth 2 (two) times  daily with a meal.   metoprolol tartrate (LOPRESSOR) 50 MG tablet Take 1 tablet (50 mg total) by mouth 2 (two) times daily.   pantoprazole (PROTONIX) 40 MG tablet TAKE 1 TABLET BY MOUTH DAILY AT NOON (Patient taking differently: Take 40 mg by mouth daily at 12 noon.)   polyethylene glycol (MIRALAX / GLYCOLAX) 17 g packet Take 17 g by mouth daily.   traMADol (ULTRAM) 50 MG tablet Take 1 tablet (50 mg total) by mouth 2 (two) times daily as needed. (Patient taking differently: Take 50 mg by mouth 2 (two) times daily as needed for moderate pain (pain score  4-6).)   TYLENOL 500 MG tablet Take 500-1,000 mg by mouth every 6 (six) hours as needed for mild pain or headache.   No facility-administered encounter medications on file as of 04/13/2023.    Allergies (verified) Septra [sulfamethoxazole-trimethoprim], Cefadroxil, Ciprofloxacin, and Erythromycin   History: Past Medical History:  Diagnosis Date   Abdominal pain    Acute renal failure (HCC)     resolved   Aortic stenosis    Arthritis    "hands & legs" (11/'10/2014)   Ataxia    Bladder outlet obstruction    BPH (benign prostatic hyperplasia)    CAD (coronary artery disease)    a. CABG IN 1989. b. 01/08/2015 CTO of ost LAD, LIMA to LAD not visualized but assumed patent given myoview finding, occluded SVG to diagonal, 99% mid RCA tx w/ SYNERGY DES 3X28 mm   Chronic heart failure with preserved ejection fraction (HFpEF) (HCC)    Constipation    Diabetes mellitus type 2, insulin dependent (HCC)    Epistaxis, recurrent 04/2015   GERD (gastroesophageal reflux disease)    HTN (hypertension)    Hx of bacterial pneumonia    Hyperlipemia    Kidney stones    Mild cognitive impairment 03/05/2021   MMSE 26/30 and 6CIT 9 03/2021 AWV   Permanent atrial fibrillation (HCC)    Pneumonia 04/2019   Rib fractures    left rib fractures being treated with pain medications   Thoracic aortic aneurysm (HCC)    Urinary tract infection     Enterococcus   Past Surgical History:  Procedure Laterality Date   CARDIAC CATHETERIZATION  1989   CARDIAC CATHETERIZATION N/A 01/08/2015   Procedure: Left Heart Cath and Cors/Grafts Angiography;  Surgeon: Peter M Swaziland, MD;  Location: [facility] INVASIVE CV LAB;  Service: Cardiovascular;  Laterality: N/A;   CARDIAC CATHETERIZATION  01/08/2015   Procedure: Coronary Stent Intervention;  Surgeon: Peter M [place]aziland, MD;  Location: [facility] INVASIVE CV LAB;  Service: Cardiovascular;;   CARPAL TUNNEL RELEASE Right 08/2010   CATARACT EXTRACTION W/ INTRAOCULAR LENS  IMPLANT, BILATERAL  Bilateral    COLONOSCOPY WITH PROPOFOL N/A 05/05/2022   Procedure: COLONOSCOPY WITH PROPOFOL;  Surgeon: Hilarie Fredrickson, MD;  Location: [facility] ENDOSCOPY;  Service: Gastroenterology;  Laterality: N/A;   CORONARY ANGIOPLASTY  01/08/15   RCA DES   CORONARY ARTERY BYPASS GRAFT  1989   "CABG X 2"   ESOPHAGOGASTRODUODENOSCOPY (EGD) WITH PROPOFOL N/A 05/05/2022   Procedure: ESOPHAGOGASTRODUODENOSCOPY (EGD) WITH PROPOFOL;  Surgeon: Hilarie Fredrickson, MD;  Location: [facility] ENDOSCOPY;  Service: Gastroenterology;  Laterality: N/A;   IR THORACENTESIS ASP PLEURAL SPACE W/IMG GUIDE  05/14/2019   JOINT REPLACEMENT     POLYPECTOMY  05/05/2022   Procedure: POLYPECTOMY;  Surgeon: Hilarie Fredrickson, MD;  Location: [facility] END[facility]COPY;  Service: Gastroenterology;;   TONSILLECTOMY     TOTAL HIP ARTHROPLASTY Right 2000  Family History  Problem Relation Age of Onset   Brain cancer Father    Throat cancer Brother    Liver disease Neg Hx    Colon cancer Neg Hx    Esophageal cancer Neg Hx    Social History   Socioeconomic History   Marital status: Married    Spouse name: Not on file   Number of children: 3   Years of education: Not on file   Highest education level: Not on file  Occupational History   Occupation: Insurance underwriter   Occupation: retired  Tobacco Use   Smoking status: Former    Current packs/day: 0.00    Average packs/day: 3.0 packs/day for 10.0 years (30.0 ttl pk-yrs)    Types: Cigarettes    Start date: 05/10/1948    Quit date: 05/11/1958    Years since quitting: 64.9   Smokeless tobacco: Never  Vaping Use   Vaping status: Never Used  Substance and Sexual Activity   Alcohol use: No   Drug use: No   Sexual activity: Not Currently  Other Topics Concern   Not on file  Social History Narrative   Previously lived in [place], [place]    Children currently living in [place], [place]    Social Drivers of Health   Financial Resource Strain: Low Risk  (04/13/2023)   Overall Financial Resource Strain (CARDIA)     Difficulty of Paying Living Expenses: Not hard at all  Food Insecurity: No Food Insecurity (04/13/2023)   Hunger Vital Sign    Worried About Running Out of Food in the Last Year: Never true    Ran Out of Food in the Last Year: Never true  Transportation Needs: No Transportation Needs (04/13/2023)   PRAPARE - Administrator, Civil Service (Medical): No    Lack of Transportation (Non-Medical): No  Physical Activity: Insufficiently Active (04/13/2023)   Exercise Vital Sign    Days of Exercise per Week: 5 days    Minutes of Exercise per Session: 10 min  Stress: No Stress Concern Present (04/13/2023)   [employer] of Occupational Health - Occupational Stress Questionnaire    Feeling of Stress : Not at all  Social Connections: Moderately Isolated (04/13/2023)   Social Connection and Isolation Panel [NHANES]    Frequency of Communication with Friends and Family: More than three times a week    Frequency of Social Gatherings with Friends and Family: More than three times a week    Attends Religious Services: Never    Database administrator or Organizations: No    Attends Engineer, structural: Never    Marital Status: Married    Tobacco Counseling Counseling given: Not Answered   Clinical Intake:  Pre-visit preparation completed: Yes  Pain : No/denies pain     BMI - recorded: 28.29 Nutritional Status: BMI 25 -29 Overweight Nutritional Risks: None Diabetes: Yes CBG done?: Yes (123per pt) CBG resulted in Enter/ Edit results?: No Did pt. bring in CBG monitor from home?: No  How often do you need to have someone help you when you read instructions, pamphlets, or other written materials from your doctor or pharmacy?: 1 - Never  Interpreter Needed?: No  Information entered by :: Lanier Ensign, LPN   Activities of Daily Living    04/13/2023   10:29 AM 01/09/2023   10:00 PM  In your present state of health, do you have any difficulty performing the  following activities:  Hearing? 1 1  Comment hearing aid  Has hearing aids  Vision? 0 1  Comment  wears glasses  Difficulty concentrating or making decisions? 0 0  Walking or climbing stairs? 0   Dressing or bathing? 0   Doing errands, shopping? 0 0  Preparing Food and eating ? N   Using the Toilet? N   In the past six months, have you accidently leaked urine? N   Do you have problems with loss of bowel control? N   Managing your Medications? N   Managing your Finances? N   Housekeeping or managing your Housekeeping? N     Patient Care Team: [name], MD as PCP - General (Family Medicine) Swaziland, Peter M, MD as PCP - Cardiology (Cardiology) Swaziland, Peter M, MD as Consulting Physician (Cardiology) Helane Gunther, DPM as Consulting Physician (Podiatry) Darrick Penna, Janetta Hora, MD (Inactive) as Consulting Physician (Vascular Surgery) Christia Reading, MD as Consulting Physician (Otolaryngology) Luciana Axe Alford Highland, MD as Consulting Physician (Ophthalmology) [facility] as Consulting Physician (General Practice) [facility] (Neurosurgery) Erroll Luna, [facility] (Inactive) (Pharmacist) Tarry Kos, MD as Consulting Physician (Orthopedic Surgery) Lynann Bologna, MD as Consulting Physician (Gastroenterology)  Indicate any recent Medical Services you may have received from other than [facility] providers in the past year (date may be approximate).     Assessment:   This is a routine wellness examination for [employer].  Hearing/Vision screen No results found.   Goals Addressed             This Visit's Progress    Patient Stated       Maintain health and activity        Depression Screen    04/13/2023   10:32 AM 03/25/2023    8:02 AM 03/16/2023    1:45 PM 02/18/2023    9:47 AM 02/07/2023   10:28 AM 12/07/2022    8:49 AM 10/28/2022    9:47 AM  PHQ 2/9 Scores  PHQ - 2 Score 0 0 0 0 0 0 0    Fall Risk    04/13/2023   10:35 AM 03/25/2023     8:01 AM 03/16/2023    1:44 PM 02/18/2023    9:47 AM 02/07/2023   10:28 AM  Fall Risk   Falls in the past year? 1 0 0 0 0  Number falls in past yr: 1 0 0 0 0  Injury with Fall? 1 0 0 0 0  Comment bruised      Risk for fall due to : History of fall(s);Impaired balance/gait No Fall Risks No Fall Risks No Fall Risks No Fall Risks  Risk for fall due to: Comment uses walker or cane as needed      Follow up Falls prevention discussed Falls evaluation completed Falls evaluation completed Falls evaluation completed Falls evaluation completed    MEDICARE RISK AT HOME: Medicare Risk at Home Any stairs in or around the home?: Yes If so, are there any without handrails?: No Home free of loose throw rugs in walkways, pet beds, electrical cords, etc?: Yes Adequate lighting in your home to reduce risk of falls?: Yes Life alert?: No Use of a cane, walker or w/c?: Yes Grab bars in the bathroom?: Yes Shower chair or bench in shower?: Yes Elevated toilet seat or a handicapped toilet?: No  TIMED UP AND GO:  Was the test performed?  No    Cognitive Function:    06/08/2017   10:30 AM  MMSE - Mini Mental State Exam  Orientation to time 5  Orientation to Place 5  Registration 3  Attention/ Calculation 3  Recall 1  Language- name 2 objects 2  Language- repeat 1  Language- follow 3 step command 3  Language- read & follow direction 1  Write a sentence 1  Copy design 1  Total score 26        04/13/2023   10:35 AM 03/11/2022    9:34 AM 03/05/2021    9:12 AM 02/15/2020    1:52 PM  6CIT Screen  What Year? 0 points 0 points 0 points 0 points  What month? 0 points 0 points 0 points 0 points  What time? 0 points 0 points 3 points   Count back from 20 0 points 0 points 0 points 0 points  Months in reverse 0 points 2 points 0 points 0 points  Repeat phrase 4 points 2 points 6 points 4 points  Total Score 4 points 4 points 9 points     Immunizations Immunization History  Administered Date(s)  Administered   Fluad Quad(high Dose 65+) 11/02/2018, 11/30/2019, 11/07/2020, 11/11/2021   Fluad Trivalent(High Dose 65+) 11/23/2022   Influenza Split 11/14/2007, 01/01/2009, 12/23/2009, 11/29/2012, 10/31/2015   Influenza, High Dose Seasonal PF 11/02/2012, 12/12/2013, 11/15/2014, 11/24/2017, 01/17/2018, 11/12/2022   Influenza, Seasonal, Injecte, Preservative Fre 11/18/2015   Influenza,inj,Quad PF,6+ Mos 02/15/2017   Influenza,trivalent, recombinat, inj, PF 12/07/2010   Influenza-Unspecified 10/28/2011, 11/30/2014, 02/15/2017   PFIZER Comirnaty(Gray Top)Covid-19 Tri-Sucrose Vaccine 06/21/2020   PFIZER(Purple Top)SARS-COV-2 Vaccination 03/23/2019, 04/14/2019, 12/06/2019, 06/21/2020   PNEUMOCOCCAL CONJUGATE-20 08/06/2020   Pfizer Covid-19 Vaccine Bivalent Booster 46yrs & up 01/02/2021   Pfizer(Comirnaty)Fall Seasonal Vaccine 12 years and older 03/13/2022, 11/12/2022   Pneumococcal Conjugate-13 12/13/2013   Pneumococcal Polysaccharide-23 08/20/2008   Td 05/29/2002   Tdap 10/28/2011, 02/15/2017   Zoster Recombinant(Shingrix) 09/16/2016, 10/13/2016, 12/13/2016, 01/11/2017   Zoster, Live 08/20/2008    TDAP status: Up to date  Flu Vaccine status: Up to date  Pneumococcal vaccine status: Up to date  Covid-19 vaccine status: Information provided on how to obtain vaccines.   Qualifies for Shingles Vaccine? Yes   Zostavax completed Yes   Shingrix Completed?: Yes  Screening Tests Health Maintenance  Topic Date Due   OPHTHALMOLOGY EXAM  04/30/2023   HEMOGLOBIN A1C  08/12/2023   Medicare Annual Wellness (AWV)  04/12/2024   DTaP/Tdap/Td (4 - Td or Tdap) 02/16/2027   Pneumonia Vaccine 39+ Years old  Completed   INFLUENZA VACCINE  Completed   COVID-19 Vaccine  Completed   Zoster Vaccines- Shingrix  Completed   HPV VACCINES  Aged Out   FOOT EXAM  Discontinued    Health Maintenance  There are no preventive care reminders to display for this patient.   Colorectal cancer screening: No  longer required.    Additional Screening:  Vision Screening: Recommended annual ophthalmology exams for early detection of glaucoma and other disorders of the eye. Is the patient up to date with their annual eye exam?  Yes  Who is the provider or what is the name of the office in which the patient att[facility]  If pt is not established with a provider, would they like to be referred to a provider to establish care? No .   Dental Screening: Recommended annual dental exams for proper oral hygiene  Diabetic Foot Exam: Diabetic Foot Exam: Completed 09/30/22  discontinued   Community Resource Referral / Chronic Care Management: CRR required this visit?  No   CCM required this visit?  No     Plan:     I have personally reviewed and noted the following in the patient's chart:   Medical and social history Use of alcohol, tobacco or illicit drugs  Current medications and supplements including opioid prescriptions. Patient is currently taking opioid prescriptions. Information provided to patient regarding non-opioid alternatives. Patient advised to discuss non-opioid treatment plan with their provider. Functional ability and status Nutritional status Physical activity Advanced directives List of other physicians Hospitalizations, surgeries, and ER visits in previous 12 months Vitals Screenings to include cognitive, depression, and falls Referrals and appointments  In addition, I have reviewed and discussed with patient certain preventive protocols, quality metrics, and best practice recommendations. A written personalized care plan for preventive services as well as general preventive health recommendations were provided to patient.     Marzella Schlein, LPN   1/61/0960   After Visit Summary: (MyChart) Due to this being a telephonic visit, the after visit summary with patients personalized plan was offered to patient via MyChart   Nurse Notes: none

## 2023-04-13 NOTE — Patient Instructions (Signed)
[name] , Thank you for taking time to come for your Medicare Wellness Visit. I appreciate your ongoing commitment to your health goals. Please review the following plan we discussed and let me know if I can assist you in the future.   Referrals/Orders/Follow-Ups/Clinician Recommendations: Each day, aim for 6 glasses of water, plenty of protein in your diet and try to get up and walk/ stretch every hour for 5-10 minutes at a time.    This is a list of the screening recommended for you and due dates:  Health Maintenance  Topic Date Due   Eye exam for diabetics  04/30/2023   Hemoglobin A1C  08/12/2023   Medicare Annual Wellness Visit  04/12/2024   DTaP/Tdap/Td vaccine (4 - Td or Tdap) 02/16/2027   Pneumonia Vaccine  Completed   Flu Shot  Completed   COVID-19 Vaccine  Completed   Zoster (Shingles) Vaccine  Completed   HPV Vaccine  Aged Out   Complete foot exam   Discontinued    Advanced directives: (Copy Requested) Please bring a copy of your health care power of attorney and living will to the office to be added to your chart at your convenience.  Next Medicare Annual Wellness Visit scheduled for next year: Yes

## 2023-04-27 ENCOUNTER — Ambulatory Visit: Payer: Self-pay | Admitting: Family Medicine

## 2023-04-27 NOTE — Telephone Encounter (Signed)
Noted -appt tomorrow.

## 2023-04-27 NOTE — Telephone Encounter (Signed)
 Chief Complaint: Frequent urination Symptoms: frequency Frequency: began yesterday, dysuria last week has resolved Pertinent Negatives: Patient denies fever, abd pain Disposition: [] ED /[] Urgent Care (no appt availability in office) / [x] Appointment(In office/virtual)/ []  [facility]/ [] Home Care/ [] Refused Recommended Disposition /[] [facility]/ []  Follow-up with PCP Additional Notes: Pt reports he experienced dysuria last week that has since resolved but reports urinary frequency that began yesterday. Pt reports he drinks plenty of fluids and since yesterday he feels like he has to urinate every 15 minutes. Pt denies dysuria, fever, abd pain or hematuria. OV scheduled tomorrow AM. This RN educated pt on home care, new-worsening symptoms, when to call back/seek emergent care. Pt verbalized understanding and agrees to plan.    Copied from CRM [id number]. Topic: Appointments - Appointment Scheduling >> Apr 27, 2023  2:37 PM Alcus Dad wrote: Patient/patient representative is calling to schedule an appointment. Patient is having frequent urination. Patient stated every 15 minutes he have to urinate. Any time that he drinks something he has to urinate directly after Reason for Disposition  Urinating more frequently than usual (i.e., frequency)  Answer Assessment - Initial Assessment Questions 1. SYMPTOM: "What's the main symptom you're concerned about?" (e.g., frequency, incontinence)     Frequent urination, had dysuria last week 2. ONSET: "When did the  frequency  start?"     Yesterday 3. PAIN: "Is there any pain?" If Yes, ask: "How bad is it?" (Scale: 1-10; mild, moderate, severe)     None 4. CAUSE: "What do you think is causing the symptoms?"     Unknown 5. OTHER SYMPTOMS: "Do you have any other symptoms?" (e.g., blood in urine, fever, flank pain, pain with urination)     None  Protocols used: Urinary Symptoms-A-AH

## 2023-04-28 ENCOUNTER — Encounter: Payer: Self-pay | Admitting: Family Medicine

## 2023-04-28 ENCOUNTER — Telehealth: Payer: Self-pay

## 2023-04-28 ENCOUNTER — Ambulatory Visit (INDEPENDENT_AMBULATORY_CARE_PROVIDER_SITE_OTHER): Payer: Medicare Other | Admitting: Family Medicine

## 2023-04-28 VITALS — BP 132/70 | HR 84 | Temp 98.4°F | Ht 65.0 in | Wt 191.4 lb

## 2023-04-28 DIAGNOSIS — R350 Frequency of micturition: Secondary | ICD-10-CM

## 2023-04-28 DIAGNOSIS — B3741 Candidal cystitis and urethritis: Secondary | ICD-10-CM

## 2023-04-28 DIAGNOSIS — N400 Enlarged prostate without lower urinary tract symptoms: Secondary | ICD-10-CM | POA: Diagnosis not present

## 2023-04-28 LAB — URINALYSIS, MICROSCOPIC ONLY

## 2023-04-28 LAB — POCT URINALYSIS DIPSTICK
Bilirubin, UA: NEGATIVE
Blood, UA: 3
Glucose, UA: POSITIVE — AB
Ketones, UA: NEGATIVE
Nitrite, UA: NEGATIVE
Protein, UA: POSITIVE — AB
Spec Grav, UA: 1.015 (ref 1.010–1.025)
Urobilinogen, UA: NEGATIVE U/dL — AB
pH, UA: 5.5 (ref 5.0–8.0)

## 2023-04-28 MED ORDER — FLUCONAZOLE 150 MG PO TABS: ORAL_TABLET | ORAL | 0 refills | Status: DC

## 2023-04-28 NOTE — Progress Notes (Signed)
 Subjective  CC:  Chief Complaint  Patient presents with   Urinary Frequency    Pt stated that he has been going back and forth to the restroom for the past four days    HPI: [name] is a 89 y.o. male who presents to the office today to address the problems listed above in the chief complaint. Discussed the use of AI scribe software for clinical note transcription with the patient, who gave verbal consent to proceed.  History of Present Illness   [name] "[name]" is a 89 year old male with diabetes who presents with increased urinary frequency.  He has experienced increased urinary frequency since yesterday, with approximately nine episodes throughout the day. He wakes up multiple times during the night to urinate, specifically at 1:00 AM, 5:00 AM, and 7:00 AM, and has had five episodes of urination in a couple of hours this morning. He describes an urgent need to urinate with minimal output and risks having accidents if he does not go. No burning sensation during urination, pain, or feeling unwell, but he feels tired due to frequent nighttime awakenings.  He has diabetes, with blood sugar levels typically ranging between 90 and 120 mg/dL. He is currently taking metformin 500 mg twice daily and Jardiance and insulin.  Jardiance previously caused yeast infections, but he is not experiencing irritation or burning this time.  He is on furosemide (Lasix) for fluid management, which he takes once daily and has already taken his dose today. He mentions reducing his water intake due to the urinary frequency, as drinking fluids exacerbates the need to urinate. In the past, he has been treated with medication for yeast infections, which alleviated similar symptoms, but he is uncertain if the current symptoms are related to a yeast infection, as there is no burning sensation this time.     He has h/o BPH on avodart; had been on tamulosin but not sure if he is taking this now  or not. It may have been stopped at his last hospitalization due to soft blood pressures.   Assessment  1. Frequent urination   2. Candidal urethritis   3. Enlarged prostate without lower urinary tract symptoms (luts)      Plan  Assessment and Plan    Frequent Urination Increased frequency of urination without dysuria. Differential includes urinary tract infection, overactive bladder, and side effect of Jardiance/yeast urethritis or hyperglycemia or diuretic use. -Order urinalysis and culture to rule out infection. -Refer to urology for further evaluation. I want him to have an evaluation. May just need to add back flomax. Continue avodart -Consider adjusting Jardiance dose if symptoms persist and if yeast infection is ruled out. Would favor continuation of jardiance for diabetic care and chf benefits.   Diabetes Mellitus Well-controlled with current regimen including Jardiance and Metformin. -Continue current regimen. -Monitor blood glucose levels regularly.  Edema No change in leg swelling. Patient is on Lasix. -Continue Lasix as prescribed. -Skip Lasix dose  x 1 if urinary frequency continues to be problematic.  General Health Maintenance -Continue monitoring blood glucose levels. -Follow up after urology consultation.        Orders Placed This Encounter  Procedures   Urine Culture   Urine Microscopic Only   Ambulatory referral to Urology   POCT Urinalysis Dipstick   Meds ordered this encounter  Medications   fluconazole (DIFLUCAN) 150 MG tablet    Sig: Take one tablet today; may repeat in 3 days if symptoms persist  Dispense:  2 tablet    Refill:  0     I reviewed the patients updated PMH, FH, and SocHx.    Patient Active Problem List   Diagnosis Date Noted   History of lower GI bleeding 06/10/2022    Priority: High   Mild cognitive impairment 03/05/2021    Priority: High   Moderate nonproliferative diabetic retinopathy of left eye (HCC) 06/18/2020     Priority: High   CKD stage 3a, GFR 45-59 ml/min (HCC) 05/26/2020    Priority: High   Aortic stenosis, moderate 05/22/2019    Priority: High   Chronic heart failure with preserved ejection fraction (HFpEF) (HCC) 03/14/2019    Priority: High   Spinal stenosis of lumbar region 02/21/2018    Priority: High   Diabetic peripheral neuropathy associated with type 2 diabetes mellitus (HCC) 05/05/2017    Priority: High   Atherosclerosis of native coronary artery of native heart without angina pectoris 06/13/2016    Priority: High   CVA (cerebral vascular accident) (HCC) 06/06/2016    Priority: High   Recurrent coronary arteriosclerosis after percutaneous transluminal coronary angioplasty 04/18/2015    Priority: High   CAD S/P PCI- Nov 2016     Priority: High   Chronic anticoagulation 02/06/2013    Priority: High   Chronic atrial fibrillation (HCC) 07/30/2011    Priority: High   Hx of CABG x 2 1990     Priority: High   Hypertension associated with diabetes (HCC)     Priority: High   Diabetes mellitus with diabetic nephropathy, with long-term current use of insulin (HCC)     Priority: High   Combined hyperlipidemia associated with type 2 diabetes mellitus (HCC)     Priority: High   Thoracic aortic aneurysm without rupture (HCC) 05/26/2020    Priority: Medium    Degeneration of lumbar intervertebral disc 02/21/2018    Priority: Medium    Chronic vertigo 03/15/2014    Priority: Medium    Obesity (BMI 30.0-34.9) 01/31/2012    Priority: Medium    Enlarged prostate without lower urinary tract symptoms (luts) 01/04/2011    Priority: Medium    Gastro-esophageal reflux disease without esophagitis 12/07/2010    Priority: Medium    Hiatal hernia 05/05/2022    Priority: Low   Osteoarthritis of metacarpophalangeal (MCP) joint of left thumb 01/18/2022    Priority: Low   Stable hemispheric central retinal vein occlusion (CRVO) of right eye 07/21/2020    Priority: Low   Lower extremity edema  03/14/2019    Priority: Low   Epistaxis, recurrent 04/11/2015    Priority: Low   Osteoarthritis, knee 01/31/2012    Priority: Low   Allergic rhinitis 10/28/2011    Priority: Low   Hearing loss 06/08/2011    Priority: Low   Primary osteoarthritis of hand 06/08/2011    Priority: Low   Benign hematuria 08/27/2008    Priority: Low   Current Meds  Medication Sig   albuterol (VENTOLIN HFA) 108 (90 Base) MCG/ACT inhaler Inhale 1-2 puffs into the lungs every 6 (six) hours as needed for wheezing or shortness of breath.   apixaban (ELIQUIS) 5 MG TABS tablet Take 1 tablet (5 mg total) by mouth 2 (two) times daily.   atorvastatin (LIPITOR) 40 MG tablet Take 1 tablet (40 mg total) by mouth at bedtime.   AVODART 0.5 MG capsule Take 0.5 mg by mouth every other day.   Cholecalciferol (VITAMIN D3 PO) Take 50 mcg by mouth daily.   diltiazem (CARDIZEM  CD) 120 MG 24 hr capsule Take 1 capsule (120 mg total) by mouth daily.   empagliflozin (JARDIANCE) 25 MG TABS tablet Take 1 tablet (25 mg total) by mouth daily before breakfast.   fluconazole (DIFLUCAN) 150 MG tablet Take one tablet today; may repeat in 3 days if symptoms persist   furosemide (LASIX) 40 MG tablet TAKE 1 TABLET(40 MG) BY MOUTH DAILY (Patient taking differently: Take 40 mg by mouth in the morning.)   gabapentin (NEURONTIN) 300 MG capsule Take 2 capsules (600 mg total) by mouth at bedtime.   insulin glargine (LANTUS) 100 UNIT/ML injection Inject 30 Units into the skin every morning.   iron polysaccharides (NIFEREX) 150 MG capsule Take 1 capsule (150 mg total) by mouth daily.   meclizine (ANTIVERT) 25 MG tablet Take 0.5-1 tablets (12.5-25 mg total) by mouth 2 (two) times daily as needed for dizziness.   meloxicam (MOBIC) 7.5 MG tablet Take 7.5 mg by mouth daily as needed for pain.   metFORMIN (GLUCOPHAGE) 500 MG tablet Take 500 mg by mouth 2 (two) times daily with a meal.   metoprolol tartrate (LOPRESSOR) 50 MG tablet Take 1 tablet (50 mg  total) by mouth 2 (two) times daily.   pantoprazole (PROTONIX) 40 MG tablet TAKE 1 TABLET BY MOUTH DAILY AT NOON (Patient taking differently: Take 40 mg by mouth daily at 12 noon.)   polyethylene glycol (MIRALAX / GLYCOLAX) 17 g packet Take 17 g by mouth daily.   traMADol (ULTRAM) 50 MG tablet Take 1 tablet (50 mg total) by mouth 2 (two) times daily as needed. (Patient taking differently: Take 50 mg by mouth 2 (two) times daily as needed for moderate pain (pain score 4-6).)   TYLENOL 500 MG tablet Take 500-1,000 mg by mouth every 6 (six) hours as needed for mild pain or headache.    Allergies: Patient is allergic to septra [sulfamethoxazole-trimethoprim], cefadroxil, ciprofloxacin, and erythromycin. Family History: Patient family history includes Brain cancer in his father; Throat cancer in his brother. Social History:  Patient  reports that he quit smoking about 65 years ago. His smoking use included cigarettes. He started smoking about 75 years ago. He has a 30 pack-year smoking history. He has never used smokeless tobacco. He reports that he does not drink alcohol and does not use drugs.  Review of Systems: Constitutional: Negative for fever malaise or anorexia Cardiovascular: negative for chest pain Respiratory: negative for SOB or persistent cough Gastrointestinal: negative for abdominal pain  Objective  Vitals: BP 132/70   Pulse 84   Temp 98.4 F (36.9 C)   Ht 5\' 5"  (1.651 m)   Wt 191 lb 6.4 oz (86.8 kg)   SpO2 98%   BMI 31.85 kg/m  General: no acute distress , A&Ox3, appears well HEENT: PEERL, conjunctiva normal, neck is supple Cardiovascular:  irreg Clear lungs Non tender abdomen No edema,  Office Visit on 04/28/2023  Component Date Value Ref Range Status   Color, UA 04/28/2023 yellow   Final   Clarity, UA 04/28/2023 cloudy   Final   Glucose, UA 04/28/2023 Positive (A)  Negative Final   Bilirubin, UA 04/28/2023 negative   Final   Ketones, UA 04/28/2023 negative    Final   Spec Grav, UA 04/28/2023 1.015  1.010 - 1.025 Final   Blood, UA 04/28/2023 3   Final   pH, UA 04/28/2023 5.5  5.0 - 8.0 Final   Protein, UA 04/28/2023 Positive (A)  Negative Final   Urobilinogen, UA 04/28/2023 negative (A)  0.2 or 1.0 E.U./dL Final   Nitrite, UA 57/84/6962 negative   Final   Leukocytes, UA 04/28/2023 Moderate (2+) (A)  Negative Final    Commons side effects, risks, benefits, and alternatives for medications and treatment plan prescribed today were discussed, and the patient expressed understanding of the given instructions. Patient is instructed to call or message via MyChart if he/she has any questions or concerns regarding our treatment plan. No barriers to understanding were identified. We discussed Red Flag symptoms and signs in detail. Patient expressed understanding regarding what to do in case of urgent or emergency type symptoms.  Medication list was reconciled, printed and provided to the patient in AVS. Patient instructions and summary information was reviewed with the patient as documented in the AVS. This note was prepared with assistance of Dragon voice recognition software. Occasional wrong-word or sound-a-like substitutions may have occurred due to the inherent limitations of voice recognition software

## 2023-04-28 NOTE — Telephone Encounter (Signed)
 Daughter has been notified of results and I also spoke with [name] and he stated that he is scheduled with Urology 4/1

## 2023-04-28 NOTE — Telephone Encounter (Signed)
 Copied from CRM [id number]. Topic: Clinical - Medication Question >> Apr 28, 2023 11:47 AM Corin V wrote: Reason for CRM: Patient's daughter Randa Evens saw that the UA results from today came in and is wondering if an antibiotic will be called in for patient. If it is, please call her to let her know what the medication is and please send to [id number] - [place], [place] - [address]. Please also call her to review results in depth.  Message sent to provider to address

## 2023-04-29 ENCOUNTER — Encounter: Payer: Self-pay | Admitting: Family Medicine

## 2023-04-29 ENCOUNTER — Other Ambulatory Visit: Payer: Self-pay

## 2023-04-29 ENCOUNTER — Telehealth: Payer: Self-pay | Admitting: Family Medicine

## 2023-04-29 ENCOUNTER — Other Ambulatory Visit: Payer: Self-pay | Admitting: Family Medicine

## 2023-04-29 MED ORDER — TAMSULOSIN HCL 0.4 MG PO CAPS: 0.4000 mg | ORAL_CAPSULE | Freq: Every day | ORAL | 3 refills | Status: DC

## 2023-04-29 MED ORDER — METFORMIN HCL 1000 MG PO TABS: 1000.0000 mg | ORAL_TABLET | Freq: Two times a day (BID) | ORAL | 3 refills | Status: AC

## 2023-04-29 NOTE — Progress Notes (Signed)
 Please call patient:initial urine results show yeast: please have pt pick up the diflucan from the pharmacy and take it.  We will need to consider adjusting his jardiance: let him know I will contact Dr. [place] to discuss and get back to him.  And still waiting on the culture to ensure no antibiotics are needed.  Thanks.   Dr. [place], [name] is getting recurrent yeast urethritis on Jardiance. What are your thoughts on decreasing the dosage or can he come off of it?  Thanks, Dr. Mardelle Matte

## 2023-04-29 NOTE — Telephone Encounter (Signed)
 Rx send in today

## 2023-04-29 NOTE — Telephone Encounter (Signed)
 Pleases see note below: Pt is requesting scripts based on pt's recent UA results.  Copied from CRM [id number]. Topic: Clinical - Medication Question >> Apr 29, 2023  1:07 PM Gurney Maxin H wrote: Reason for CRM: Patients daughter states she's seen her dad's labs in Mychart and wants to know if medication will be sent in for him today.  Randa Evens [phone]

## 2023-04-30 LAB — URINE CULTURE
MICRO NUMBER:: 16138057
SPECIMEN QUALITY:: ADEQUATE

## 2023-05-02 ENCOUNTER — Ambulatory Visit (HOSPITAL_COMMUNITY)
Admission: RE | Admit: 2023-05-02 | Discharge: 2023-05-02 | Disposition: A | Discharge status: Home / Self Care | Payer: Medicare Other | Source: Ambulatory Visit | Attending: Nurse Practitioner | Admitting: Nurse Practitioner

## 2023-05-02 DIAGNOSIS — I350 Nonrheumatic aortic (valve) stenosis: Secondary | ICD-10-CM | POA: Diagnosis not present

## 2023-05-02 LAB — ECHOCARDIOGRAM COMPLETE
AR max vel: 0.88 cm2
AV Area VTI: 0.73 cm2
AV Area mean vel: 0.87 cm2
AV Mean grad: 10.8 mmHg
AV Peak grad: 20.1 mmHg
Ao pk vel: 2.24 m/s
Area-P 1/2: 4.21 cm2
MV M vel: 1.65 m/s
MV Peak grad: 10.9 mmHg
P 1/2 time: 1137 ms
S' Lateral: 3.66 cm

## 2023-05-02 MED ORDER — FLUCONAZOLE 150 MG PO TABS: 150.0000 mg | ORAL_TABLET | Freq: Every day | ORAL | 0 refills | Status: AC

## 2023-05-09 ENCOUNTER — Encounter: Payer: Self-pay | Admitting: Family Medicine

## 2023-05-09 ENCOUNTER — Ambulatory Visit: Payer: Medicare Other | Admitting: Family Medicine

## 2023-05-09 VITALS — BP 129/60 | HR 74 | Temp 98.2°F | Ht 65.0 in | Wt 199.2 lb

## 2023-05-09 DIAGNOSIS — I482 Chronic atrial fibrillation, unspecified: Secondary | ICD-10-CM

## 2023-05-09 DIAGNOSIS — Z794 Long term (current) use of insulin: Secondary | ICD-10-CM | POA: Diagnosis not present

## 2023-05-09 DIAGNOSIS — E1121 Type 2 diabetes mellitus with diabetic nephropathy: Secondary | ICD-10-CM

## 2023-05-09 DIAGNOSIS — I350 Nonrheumatic aortic (valve) stenosis: Secondary | ICD-10-CM | POA: Diagnosis not present

## 2023-05-09 DIAGNOSIS — I5032 Chronic diastolic (congestive) heart failure: Secondary | ICD-10-CM | POA: Diagnosis not present

## 2023-05-09 DIAGNOSIS — N1832 Chronic kidney disease, stage 3b: Secondary | ICD-10-CM

## 2023-05-09 LAB — POCT GLYCOSYLATED HEMOGLOBIN (HGB A1C): Hemoglobin A1C: 7.9 % — AB (ref 4.0–5.6)

## 2023-05-09 NOTE — Progress Notes (Signed)
 Subjective  CC:  Chief Complaint  Patient presents with   Diabetes    HPI: [name] is a 89 y.o. male who presents to the office today for follow up of diabetes and problems listed above in the chief complaint.  Discussed the use of AI scribe software for clinical note transcription with the patient, who gave verbal consent to proceed.  History of Present Illness   He is a 89 year old with diabetes who presents for medication management and dizziness.  He has a history of diabetes with recent changes in his medication regimen. His metformin dose was increased from 500 mg to 1000 mg twice a day however he did not pick up the larger dose yet. He is also on Lantus, which was increased but may need adjustment back to 25 units at night due to low morning blood sugar levels. He has 2 documented and blood sugars in the 60s.  He denies symptoms of hypoglycemia.  Several readings are in the 90s. His A1c is currently 7.9, slightly higher than usual, and he notes that his blood sugar levels were affected by his recent birthday party, where he consumed cake.  He experiences intermittent dizziness, which comes and goes. He is unsure if it is related to any medication, specifically mentioning a pill he was taking for his bladder, which has since been stopped. No current dizziness during the visit.  Has chronic intermittent vertigo.  Previous urinary symptoms have resolved after stopping the Jardiance.  His urine culture did reveal yeast.  He was treated with Diflucan for 7 days.  No bacterial infection was found. Urination has returned to normal.  Reviewed recent echocardiogram.     Wt Readings from Last 3 Encounters:  05/09/23 199 lb 3.2 oz (90.4 kg)  04/28/23 191 lb 6.4 oz (86.8 kg)  04/13/23 170 lb (77.1 kg)    BP Readings from Last 3 Encounters:  05/09/23 129/60  04/28/23 132/70  04/07/23 136/79    Assessment  1. Type 2 diabetes mellitus with diabetic nephropathy, with  long-term current use of insulin (HCC)   2. Stage 3b chronic kidney disease (HCC) Chronic  3. Chronic atrial fibrillation (HCC) Chronic  4. Aortic stenosis, moderate   5. Chronic heart failure with preserved ejection fraction (HFpEF) (HCC)      Plan  Diabetes is currently well controlled. Goal A1c < 8 given age and comorbidities. Will lower lantus back to 25 unit at bedtime to avoid am hypoglycemia. Increase met to 1000 bid. Reports had eye exam last month at [place].  Chronic medical problems are controlled.  Reviewed recent echocardiogram.  Has follow-up with Dr. Swaziland later this month.  Moderate to severe aortic stenosis, mild aortic regurg, chronic heart failure with low EF.  Patient is stable.  Denies chest pain or shortness of breath.  No change in the extremity edema.  Takes multiple medications.  He stopped Jardiance due to recurrent urethritis, yeast cystitis. Yeast cystitis and urethritis: Resolved after 7 days of Diflucan.  Urine culture otherwise negative.  Feels back to normal.  No longer with urinary frequency.  Irritation  Follow up: 3 months for recheck diabetes and blood pressure. Orders Placed This Encounter  Procedures   POCT HgB A1C   No orders of the defined types were placed in this encounter.     Immunization History  Administered Date(s) Administered   Fluad Quad(high Dose 65+) 11/02/2018, 11/30/2019, 11/07/2020, 11/11/2021   Fluad Trivalent(High Dose 65+) 11/23/2022   Influenza Split  11/14/2007, 01/01/2009, 12/23/2009, 11/29/2012, 10/31/2015   Influenza, High Dose Seasonal PF 11/02/2012, 12/12/2013, 11/15/2014, 11/24/2017, 01/17/2018, 11/12/2022   Influenza, Seasonal, Injecte, Preservative Fre 11/18/2015   Influenza,inj,Quad PF,6+ Mos 02/15/2017   Influenza,trivalent, recombinat, inj, PF 12/07/2010   Influenza-Unspecified 10/28/2011, 11/30/2014, 02/15/2017   PFIZER Comirnaty(Gray Top)Covid-19 Tri-Sucrose Vaccine 06/21/2020   PFIZER(Purple Top)SARS-COV-2  Vaccination 03/23/2019, 04/14/2019, 12/06/2019, 06/21/2020   PNEUMOCOCCAL CONJUGATE-20 08/06/2020   Pfizer Covid-19 Vaccine Bivalent Booster 60yrs & up 01/02/2021   Pfizer(Comirnaty)Fall Seasonal Vaccine 12 years and older 03/13/2022, 11/12/2022   Pneumococcal Conjugate-13 12/13/2013   Pneumococcal Polysaccharide-23 08/20/2008   Td 05/29/2002   Tdap 10/28/2011, 02/15/2017   Zoster Recombinant(Shingrix) 09/16/2016, 10/13/2016, 12/13/2016, 01/11/2017   Zoster, Live 08/20/2008    Diabetes Related Lab Review: Lab Results  Component Value Date   HGBA1C 7.9 (A) 05/09/2023   HGBA1C 7.0 (A) 02/07/2023   HGBA1C 7.3 (A) 09/30/2022    Lab Results  Component Value Date   MICROALBUR <0.7 03/14/2019   Lab Results  Component Value Date   CREATININE 1.12 04/04/2023   BUN 15 04/04/2023   NA 141 04/04/2023   K 3.6 04/04/2023   CL 106 04/04/2023   CO2 26 04/04/2023   Lab Results  Component Value Date   CHOL 147 03/11/2022   CHOL 154 04/14/2021   CHOL 154 08/04/2020   Lab Results  Component Value Date   HDL 52.30 03/11/2022   HDL 54.70 04/14/2021   HDL 46.10 08/04/2020   Lab Results  Component Value Date   LDLCALC 80 03/11/2022   LDLCALC 75 04/14/2021   LDLCALC 90 08/04/2020   Lab Results  Component Value Date   TRIG 75.0 03/11/2022   TRIG 119.0 04/14/2021   TRIG 86.0 08/04/2020   Lab Results  Component Value Date   CHOLHDL 3 03/11/2022   CHOLHDL 3 04/14/2021   CHOLHDL 3 08/04/2020   No results found for: "LDLDIRECT" The ASCVD Risk score (Arnett DK, et al., 2019) failed to calculate for the following reasons:   The 2019 ASCVD risk score is only valid for ages 11 to 82   Risk score cannot be calculated because patient has a medical history suggesting prior/existing ASCVD I have reviewed the PMH, Fam and Soc history. Patient Active Problem List   Diagnosis Date Noted Date Diagnosed   History of lower GI bleeding 06/10/2022     Priority: High    Heme positive stools in  setting of worsening anemia: colonoscopy 2024 several polyps, likely chronic bleed from polyps. Tics; otherwise ok EGD at same time: nl esophagus Dr. Marina Goodell    Mild cognitive impairment 03/05/2021     Priority: High    MMSE 26/30 and 6CIT 9 03/2021 AWV    Moderate nonproliferative diabetic retinopathy of left eye (HCC) 06/18/2020     Priority: High   CKD stage 3a, GFR 45-59 ml/min (HCC) 05/26/2020     Priority: High   Aortic stenosis, moderate 05/22/2019     Priority: High    Stable echocardiogram 05/2020, Dr. Swaziland    Chronic heart failure with preserved ejection fraction (HFpEF) (HCC) 03/14/2019     Priority: High   Spinal stenosis of lumbar region 02/21/2018     Priority: High   Diabetic peripheral neuropathy associated with type 2 diabetes mellitus (HCC) 05/05/2017     Priority: High    No pain    Atherosclerosis of native coronary artery of native heart without angina pectoris 06/13/2016     Priority: High    Overview:  Overview:  RCA DES placed Nov 2016, LIMA-LAD presumed patent based on Myoview but no visualized, SVG-Dx occluded  Last Assessment & Plan:  RCA DES placed Nov 2016, LIMA-LAD presumed patent based on Myoview but no visualized, SVG-Dx occluded    CVA (cerebral vascular accident) (HCC) 06/06/2016     Priority: High   Recurrent coronary arteriosclerosis after percutaneous transluminal coronary angioplasty 04/18/2015     Priority: High    Overview:  Overview:  RCA DES placed Nov 2016, LIMA-LAD presumed patent based on Myoview but no visualized, SVG-Dx occluded  Last Assessment & Plan:  RCA DES placed Nov 2016, LIMA-LAD presumed patent based on Myoview but no visualized, SVG-Dx occluded    CAD S/P PCI- Nov 2016      Priority: High    RCA DES placed Nov 2016, LIMA-LAD presumed patent based on Myoview but no visualized, SVG-Dx occluded    Chronic anticoagulation 02/06/2013     Priority: High    Overview:  Monitored by cardiology    Chronic atrial  fibrillation (HCC) 07/30/2011     Priority: High    CHADs VASc=5 for age, HTN, vascular disease, and DM    Hx of CABG x 2 1990      Priority: High    Status post CABG x2 in 1990 including an LIMA graft to the LAD, and a vein graft to the diagonal.     Hypertension associated with diabetes (HCC)      Priority: High   Diabetes mellitus with diabetic nephropathy, with long-term current use of insulin (HCC)      Priority: High    Neg urine microalbuminuria, not on ace.  On farxiga    Combined hyperlipidemia associated with type 2 diabetes mellitus (HCC)      Priority: High   Thoracic aortic aneurysm without rupture (HCC) 05/26/2020     Priority: Medium     Followed by cards. Stable by CT 2022    Degeneration of lumbar intervertebral disc 02/21/2018     Priority: Medium    Chronic vertigo 03/15/2014     Priority: Medium    Obesity (BMI 30.0-34.9) 01/31/2012     Priority: Medium    Enlarged prostate without lower urinary tract symptoms (luts) 01/04/2011     Priority: Medium     Overview:  Urology - avodart and tamuloscin    Gastro-esophageal reflux disease without esophagitis 12/07/2010     Priority: Medium    Hiatal hernia 05/05/2022     Priority: Low   Osteoarthritis of metacarpophalangeal (MCP) joint of left thumb 01/18/2022     Priority: Low   Stable hemispheric central retinal vein occlusion (CRVO) of right eye 07/21/2020     Priority: Low   Lower extremity edema 03/14/2019     Priority: Low   Epistaxis, recurrent 04/11/2015     Priority: Low   Osteoarthritis, knee 01/31/2012     Priority: Low   Allergic rhinitis 10/28/2011     Priority: Low   Hearing loss 06/08/2011     Priority: Low   Primary osteoarthritis of hand 06/08/2011     Priority: Low   Benign hematuria 08/27/2008     Priority: Low    Essential Hematuria - urology - Isabel Caprice w/u benign     Stage 3b chronic kidney disease (HCC) 05/09/2023     Social History: Patient  reports that he quit smoking  about 65 years ago. His smoking use included cigarettes. He started smoking about 75 years ago. He has a 30 pack-year  smoking history. He has never used smokeless tobacco. He reports that he does not drink alcohol and does not use drugs.  Review of Systems: Ophthalmic: negative for eye pain, loss of vision or double vision Cardiovascular: negative for chest pain Respiratory: negative for SOB or persistent cough Gastrointestinal: negative for abdominal pain Genitourinary: negative for dysuria or gross hematuria MSK: negative for foot lesions Neurologic: negative for weakness or gait disturbance  Objective  Vitals: BP 129/60   Pulse 74   Temp 98.2 F (36.8 C)   Ht 5\' 5"  (1.651 m)   Wt 199 lb 3.2 oz (90.4 kg)   SpO2 98%   BMI 33.15 kg/m  General: Tired appearing today, no acute distress  Psych:  Alert and oriented, normal mood and affect HEENT:  Normocephalic, atraumatic, moist mucous membranes, supple neck  Cardiovascular:  Nl S1 and S2, RRR 2/6 systolic murmur present bradycardia, no edema  respiratory:  Good breath sounds bilaterally, CTAB with normal effort, no rales  Diabetic education: ongoing education regarding chronic disease management for diabetes was given today. We continue to reinforce the ABC's of diabetic management: A1c (<7 or 8 dependent upon patient), tight blood pressure control, and cholesterol management with goal LDL < 100 minimally. We discuss diet strategies, exercise recommendations, medication options and possible side effects. At each visit, we review recommended immunizations and preventive care recommendations for diabetics and stress that good diabetic control can prevent other problems. See below for this patient's data.   Commons side effects, risks, benefits, and alternatives for medications and treatment plan prescribed today were discussed, and the patient expressed understanding of the given instructions. Patient is instructed to call or message via  MyChart if he/she has any questions or concerns regarding our treatment plan. No barriers to understanding were identified. We discussed Red Flag symptoms and signs in detail. Patient expressed understanding regarding what to do in case of urgent or emergency type symptoms.  Medication list was reconciled, printed and provided to the patient in AVS. Patient instructions and summary information was reviewed with the patient as documented in the AVS. This note was prepared with assistance of Dragon voice recognition software. Occasional wrong-word or sound-a-like substitutions may have occurred due to the inherent limitations of voice recognition software

## 2023-05-09 NOTE — Patient Instructions (Signed)
Please return in 3 months to recheck diabetes and blood pressure  If you have any questions or concerns, please don't hesitate to send me a message via MyChart or call the office at [phone]. Thank you for visiting with [place] today! It's our pleasure caring for you.

## 2023-05-11 ENCOUNTER — Encounter: Payer: Self-pay | Admitting: Orthopaedic Surgery

## 2023-05-11 ENCOUNTER — Telehealth: Payer: Self-pay

## 2023-05-11 ENCOUNTER — Ambulatory Visit (INDEPENDENT_AMBULATORY_CARE_PROVIDER_SITE_OTHER): Admitting: Sports Medicine

## 2023-05-11 ENCOUNTER — Ambulatory Visit (INDEPENDENT_AMBULATORY_CARE_PROVIDER_SITE_OTHER): Admitting: Orthopaedic Surgery

## 2023-05-11 ENCOUNTER — Encounter: Payer: Self-pay | Admitting: Sports Medicine

## 2023-05-11 ENCOUNTER — Other Ambulatory Visit (INDEPENDENT_AMBULATORY_CARE_PROVIDER_SITE_OTHER): Payer: Self-pay

## 2023-05-11 ENCOUNTER — Other Ambulatory Visit: Payer: Self-pay

## 2023-05-11 DIAGNOSIS — G932 Benign intracranial hypertension: Secondary | ICD-10-CM

## 2023-05-11 DIAGNOSIS — M7061 Trochanteric bursitis, right hip: Secondary | ICD-10-CM

## 2023-05-11 DIAGNOSIS — M25551 Pain in right hip: Secondary | ICD-10-CM

## 2023-05-11 MED ORDER — METHYLPREDNISOLONE ACETATE 40 MG/ML IJ SUSP
40.0000 mg | INTRAMUSCULAR | Status: AC | PRN
  Administered 2023-05-11: 40 mg via INTRA_ARTICULAR

## 2023-05-11 MED ORDER — LIDOCAINE HCL 1 % IJ SOLN
4.0000 mL | INTRAMUSCULAR | Status: AC | PRN
  Administered 2023-05-11: 4 mL

## 2023-05-11 NOTE — Progress Notes (Signed)
   Imaging & Procedure Note  Patient: [name]             Date of Birth: 08/19/1928           MRN: [id number]             Visit Date: 05/11/2023  Imaging:  [place] Extrem Low Right Ltd Limited MSK Ultrasound of the right hip was performed today. There is an  area of hypoechoic fluid that is sitting both superior and lateral to the  hip arthroplasty.  This does not seem to communicate with the hip joint.   The femoral head arthroplasty was seen and evaluated with appropriate  acoustic shadowing, no notable hyperemia here.  There is mixed echogenic  scar tissue in fibromatous tissue superior to his soft tissue mass, this  is likely a pseudotumor comparing this to his MRI.  This was approximately  11.6 cm in circumference and 7.27 cm^2 in area.   Procedures: Visit Diagnoses:  1. Pain in right hip   2. Pseudotumor    Large Joint Inj: R hip joint on 05/11/2023 10:03 PM Indications: pain and joint swelling Details: 18 G 3.5 in needle, ultrasound-guided anterior approach Medications: 4 mL lidocaine 1 %; 40 mg methylPREDNISolone acetate 40 MG/ML Aspirate: 8 mL blood-tinged Outcome: tolerated well, no immediate complications Procedure, treatment alternatives, risks and benefits explained, specific risks discussed. Consent was given by the patient. Immediately prior to procedure a time out was called to verify the correct patient, procedure, equipment, support staff and site/side marked as required. Patient was prepped and draped in the usual sterile fashion.     - see Dr. Warren Danes note for remainder of A/P  Madelyn Brunner, DO Primary Care Sports Medicine Physician  [facility] - Orthopedics  This note was dictated using Dragon naturally speaking software and may contain errors in syntax, spelling, or content which have not been identified prior to signing this note.

## 2023-05-11 NOTE — Progress Notes (Signed)
 Office Visit Note   Patient: [name]           Date of Birth: 1928-03-14           MRN: [id number] Visit Date: 05/11/2023              Requested by: [name], MD [address] [place],  [place] [address] PCP: [name], MD   Assessment & Plan: Visit Diagnoses:  1. Trochanteric bursitis, right hip     Plan: [name] is a 89 year old gentleman with trochanteric right hip pain.  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  He does not display any signs of infection.  We will send him to Dr. Shon Baton for ultrasound-guided aspiration of the pseudotumor and cortisone injection for pain relief.  Follow-Up Instructions: No follow-ups on file.   Orders:  No orders of the defined types were placed in this encounter.  No orders of the defined types were placed in this encounter.     Procedures: No procedures performed   Clinical Data: No additional findings.   Subjective: Chief Complaint  Patient presents with   Right Hip - Pain    HPI [name] comes back today for follow up evaluation of right hip pain.  Underwent 2 trochanteric injections last year.    Review of Systems   Objective: Vital Signs: There were no vitals taken for this visit.  Physical Exam  Ortho Exam Examination of the right hip is unchanged from prior visit.  No signs of infection. Specialty Comments:  No specialty comments available.  Imaging: No results found.   PMFS History: Patient Active Problem List   Diagnosis Date Noted   Stage 3b chronic kidney disease (HCC) 05/09/2023   History of lower GI bleeding 06/10/2022   Hiatal hernia 05/05/2022   Osteoarthritis of metacarpophalangeal (MCP) joint of left thumb 01/18/2022   Mild cognitive impairment 03/05/2021   Stable hemispheric central retinal vein occlusion (CRVO) of right eye 07/21/2020   Moderate nonproliferative diabetic retinopathy of left eye (HCC) 06/18/2020   Thoracic aortic  aneurysm without rupture (HCC) 05/26/2020   CKD stage 3a, GFR 45-59 ml/min (HCC) 05/26/2020   Aortic stenosis, moderate 05/22/2019   Chronic heart failure with preserved ejection fraction (HFpEF) (HCC) 03/14/2019   Lower extremity edema 03/14/2019   Spinal stenosis of lumbar region 02/21/2018   Degeneration of lumbar intervertebral disc 02/21/2018   Diabetic peripheral neuropathy associated with type 2 diabetes mellitus (HCC) 05/05/2017   Atherosclerosis of native coronary artery of native heart without angina pectoris 06/13/2016   CVA (cerebral vascular accident) (HCC) 06/06/2016   Recurrent coronary arteriosclerosis after percutaneous transluminal coronary angioplasty 04/18/2015   Epistaxis, recurrent 04/11/2015   CAD S/P PCI- Nov 2016    Chronic vertigo 03/15/2014   Chronic anticoagulation 02/06/2013   Obesity (BMI 30.0-34.9) 01/31/2012   Osteoarthritis, knee 01/31/2012   Allergic rhinitis 10/28/2011   Chronic atrial fibrillation (HCC) 07/30/2011   Hearing loss 06/08/2011   Primary osteoarthritis of hand 06/08/2011   Enlarged prostate without lower urinary tract symptoms (luts) 01/04/2011   Gastro-esophageal reflux disease without esophagitis 12/07/2010   Hx of CABG x 2 1990    Hypertension associated with diabetes (HCC)    Diabetes mellitus with diabetic nephropathy, with long-term current use of insulin (HCC)    Combined hyperlipidemia associated with type 2 diabetes mellitus (HCC)    Benign hematuria 08/27/2008   Past Medical History:  Diagnosis Date   Abdominal pain  Acute renal failure (HCC)     resolved   Aortic stenosis    Arthritis    "hands & legs" (11/'10/2014)   Ataxia    Bladder outlet obstruction    BPH (benign prostatic hyperplasia)    CAD (coronary artery disease)    a. CABG IN 1989. b. 01/08/2015 CTO of ost LAD, LIMA to LAD not visualized but assumed patent given myoview finding, occluded SVG to diagonal, 99% mid RCA tx w/ SYNERGY DES 3X28 mm   Chronic  heart failure with preserved ejection fraction (HFpEF) (HCC)    Constipation    Diabetes mellitus type 2, insulin dependent (HCC)    Epistaxis, recurrent 04/2015   GERD (gastroesophageal reflux disease)    HTN (hypertension)    Hx of bacterial pneumonia    Hyperlipemia    Kidney stones    Mild cognitive impairment 03/05/2021   MMSE 26/30 and 6CIT 9 03/2021 AWV   Permanent atrial fibrillation (HCC)    Pneumonia 04/2019   Rib fractures    left rib fractures being treated with pain medications   Thoracic aortic aneurysm (HCC)    Urinary tract infection     Enterococcus    Family History  Problem Relation Age of Onset   Brain cancer Father    Throat cancer Brother    Liver disease Neg Hx    Colon cancer Neg Hx    Esophageal cancer Neg Hx     Past Surgical History:  Procedure Laterality Date   CARDIAC CATHETERIZATION  1989   CARDIAC CATHETERIZATION N/A 01/08/2015   Procedure: Left Heart Cath and Cors/Grafts Angiography;  Surgeon: Peter M Swaziland, MD;  Location: [facility] INVASIVE CV LAB;  Service: Cardiovascular;  Laterality: N/A;   CARDIAC CATHETERIZATION  01/08/2015   Procedure: Coronary Stent Intervention;  Surgeon: Peter M Swaziland, MD;  Location: [facility] INVASIVE CV LAB;  Service: Cardiovascular;;   CARPAL TUNNEL RELEASE Right 08/2010   CATARACT EXTRACTION W/ INTRAOCULAR LENS  IMPLANT, BILATERAL Bilateral    COLONOSCOPY WITH PROPOFOL N/A 05/05/2022   Procedure: COLONOSCOPY WITH PROPOFOL;  Surgeon: Hilarie Fredrickson, MD;  Location: [facility] ENDOSCOPY;  Service: Gastroenterology;  Laterality: N/A;   CORONARY ANGIOPLASTY  01/08/15   RCA DES   CORONARY ARTERY BYPASS GRAFT  1989   "CABG X 2"   ESOPHAGOGASTRODUODENOSCOPY (EGD) WITH PROPOFOL N/A 05/05/2022   Procedure: ESOPHAGOGASTRODUODENOSCOPY (EGD) WITH PROPOFOL;  Surgeon: Hilarie Fredrickson, MD;  Location: [facility];  Service: Gastroenterology;  Laterality: N/A;   IR THORACENTESIS ASP PLEURAL SPACE W/IMG GUIDE  05/14/2019   JOINT REPLACEMENT     POLYPECTOMY   05/05/2022   Procedure: POLYPECTOMY;  Surgeon: Hilarie Fredrickson, MD;  Location: [facility];  Service: Gastroenterology;;   TONSILLECTOMY     TOTAL HIP ARTHROPLASTY Right 2000   Social History   Occupational History   Occupation: Insurance underwriter   Occupation: retired  Tobacco Use   Smoking status: Former    Current packs/day: 0.00    Average packs/day: 3.0 packs/day for 10.0 years (30.0 ttl pk-yrs)    Types: Cigarettes    Start date: 05/10/1948    Quit date: 05/11/1958    Years since quitting: 65.0   Smokeless tobacco: Never  Vaping Use   Vaping status: Never Used  Substance and Sexual Activity   Alcohol use: No   Drug use: No   Sexual activity: Not Currently

## 2023-05-11 NOTE — Telephone Encounter (Signed)
 Copied from CRM [id number]. Topic: Clinical - Medication Question >> May 11, 2023  1:21 PM Sonny Dandy B wrote: Reason for CRM: pt called to speak with lisa, pt states he is returning a call pt call pt back at [phone]  Misty Stanley has spoken with pt

## 2023-05-16 ENCOUNTER — Ambulatory Visit: Payer: Self-pay | Admitting: Family Medicine

## 2023-05-16 NOTE — Telephone Encounter (Signed)
 Chief Complaint: Hyperglycemia Symptoms: weakness, dizziness for last two weeks Frequency: last few days Pertinent Negatives: Patient denies numbness/tingling in arms or legs, confusion, vomiting, fever, recent illness Disposition: [] ED /[] Urgent Care (no appt availability in office) / [x] Appointment(In office/virtual)/ []  [facility]/ [] Home Care/ [] Refused Recommended Disposition /[] [facility]/ []  Follow-up with PCP Additional Notes: Patient called to make appt with PCP due to blood glucose levels been all over the place recently. Patient states he normally runs 90-120 but has been as high as 190 recently. Patient takes Metformin and Lantus daily. Patient states he has been experiencing mild weakness and dizziness over the last two weeks. Patient denies numbness or tingling of arms or legs, confusion, vomiting, fever, recent illness. Patient offered earlier appt but states he would only like to see PCP.    Copied from CRM [id number]. Topic: Appointments - Appointment Scheduling >> May 16, 2023 10:17 AM Truddie Crumble wrote: Patient/patient representative is calling to schedule an appointment. Refer to attachments for appointment information.  Patient stated his blood sugar is increasing Reason for Disposition  [1] Symptoms of high blood sugar (e.g., abnormally thirsty, frequent urination, weight loss) AND [2] not able to test blood glucose    Weakness, dizziness  Answer Assessment - Initial Assessment Questions 1. BLOOD GLUCOSE: "What is your blood glucose level?"      189 2. ONSET: "When did you check the blood glucose?"     Half an hour ago 3. USUAL RANGE: "What is your glucose level usually?" (e.g., usual fasting morning value, usual evening value)     Between 90-120 4. KETONES: "Do you check for ketones (urine or blood test strips)?" If Yes, ask: "What does the test show now?"      No 5. TYPE 1 or 2:  "Do you know what type of diabetes you have?"  (e.g., Type 1, Type 2,  Gestational; doesn't know)      Type 2 diabetic  6. INSULIN: "Do you take insulin?" "What type of insulin(s) do you use? What is the mode of delivery? (syringe, pen; injection or pump)?"      Lantus - 40 units every morning 7. DIABETES PILLS: "Do you take any pills for your diabetes?" If Yes, ask: "Have you missed taking any pills recently?"     Metformin 8. OTHER SYMPTOMS: "Do you have any symptoms?" (e.g., fever, frequent urination, difficulty breathing, dizziness, weakness, vomiting)     Dizziness, weakness  Protocols used: Diabetes - High Blood Sugar-A-AH

## 2023-05-17 DIAGNOSIS — H348112 Central retinal vein occlusion, right eye, stable: Secondary | ICD-10-CM | POA: Diagnosis not present

## 2023-05-17 DIAGNOSIS — E113293 Type 2 diabetes mellitus with mild nonproliferative diabetic retinopathy without macular edema, bilateral: Secondary | ICD-10-CM | POA: Diagnosis not present

## 2023-05-17 NOTE — Telephone Encounter (Signed)
 Noted.

## 2023-05-18 ENCOUNTER — Ambulatory Visit: Admitting: Family Medicine

## 2023-05-18 ENCOUNTER — Encounter: Payer: Self-pay | Admitting: Family Medicine

## 2023-05-18 VITALS — BP 134/66 | HR 85 | Temp 98.4°F | Ht 65.0 in | Wt 191.4 lb

## 2023-05-18 DIAGNOSIS — T380X5A Adverse effect of glucocorticoids and synthetic analogues, initial encounter: Secondary | ICD-10-CM

## 2023-05-18 DIAGNOSIS — R739 Hyperglycemia, unspecified: Secondary | ICD-10-CM

## 2023-05-18 NOTE — Progress Notes (Signed)
 Subjective  CC:  Chief Complaint  Patient presents with   Diabetes    Blood sugar has been increasing for the past week, pt has readings with him.    HPI: [name] is a 89 y.o. male who presents to the office today to address the problems listed above in the chief complaint. Sugars are higher than normal over the last week: history reveals had 2 steroid shots: hand OA and hip last week. Last injection given 12th and 13th glucose was 248. Since, running in the 130s-180s fasting. Has started mild diarrhea yesterday w/o nausea or body aches or fevers. Feels well otherwise. No urinary sxs.    Assessment  1. Steroid-induced hyperglycemia      Plan  hyperglycemia:  steroid induced. Education given on etiology and management. No changes in therapy. Push fluids and avoid sweets. Should improve. To call if worsens or if develop sxs.   Follow up: 3 mo  No orders of the defined types were placed in this encounter.  No orders of the defined types were placed in this encounter.     I reviewed the patients updated PMH, FH, and SocHx.    Patient Active Problem List   Diagnosis Date Noted   History of lower GI bleeding 06/10/2022    Priority: High   Mild cognitive impairment 03/05/2021    Priority: High   Moderate nonproliferative diabetic retinopathy of left eye (HCC) 06/18/2020    Priority: High   CKD stage 3a, GFR 45-59 ml/min (HCC) 05/26/2020    Priority: High   Aortic stenosis, moderate 05/22/2019    Priority: High   Chronic heart failure with preserved ejection fraction (HFpEF) (HCC) 03/14/2019    Priority: High   Spinal stenosis of lumbar region 02/21/2018    Priority: High   Diabetic peripheral neuropathy associated with type 2 diabetes mellitus (HCC) 05/05/2017    Priority: High   Atherosclerosis of native coronary artery of native heart without angina pectoris 06/13/2016    Priority: High   CVA (cerebral vascular accident) (HCC) 06/06/2016    Priority:  High   Recurrent coronary arteriosclerosis after percutaneous transluminal coronary angioplasty 04/18/2015    Priority: High   CAD S/P PCI- Nov 2016     Priority: High   Chronic anticoagulation 02/06/2013    Priority: High   Chronic atrial fibrillation (HCC) 07/30/2011    Priority: High   Hx of CABG x 2 1990     Priority: High   Hypertension associated with diabetes (HCC)     Priority: High   Diabetes mellitus with diabetic nephropathy, with long-term current use of insulin (HCC)     Priority: High   Combined hyperlipidemia associated with type 2 diabetes mellitus (HCC)     Priority: High   Thoracic aortic aneurysm without rupture (HCC) 05/26/2020    Priority: Medium    Degeneration of lumbar intervertebral disc 02/21/2018    Priority: Medium    Chronic vertigo 03/15/2014    Priority: Medium    Obesity (BMI 30.0-34.9) 01/31/2012    Priority: Medium    Enlarged prostate without lower urinary tract symptoms (luts) 01/04/2011    Priority: Medium    Gastro-esophageal reflux disease without esophagitis 12/07/2010    Priority: Medium    Hiatal hernia 05/05/2022    Priority: Low   Osteoarthritis of metacarpophalangeal (MCP) joint of left thumb 01/18/2022    Priority: Low   Stable hemispheric central retinal vein occlusion (CRVO) of right eye 07/21/2020    Priority:  Low   Lower extremity edema 03/14/2019    Priority: Low   Epistaxis, recurrent 04/11/2015    Priority: Low   Osteoarthritis, knee 01/31/2012    Priority: Low   Allergic rhinitis 10/28/2011    Priority: Low   Hearing loss 06/08/2011    Priority: Low   Primary osteoarthritis of hand 06/08/2011    Priority: Low   Benign hematuria 08/27/2008    Priority: Low   Stage 3b chronic kidney disease (HCC) 05/09/2023   Current Meds  Medication Sig   albuterol (VENTOLIN HFA) 108 (90 Base) MCG/ACT inhaler Inhale 1-2 puffs into the lungs every 6 (six) hours as needed for wheezing or shortness of breath.   apixaban (ELIQUIS)  5 MG TABS tablet Take 1 tablet (5 mg total) by mouth 2 (two) times daily.   atorvastatin (LIPITOR) 40 MG tablet Take 1 tablet (40 mg total) by mouth at bedtime.   AVODART 0.5 MG capsule Take 0.5 mg by mouth every other day.   Cholecalciferol (VITAMIN D3 PO) Take 50 mcg by mouth daily.   diltiazem (CARDIZEM CD) 120 MG 24 hr capsule Take 1 capsule (120 mg total) by mouth daily.   fluconazole (DIFLUCAN) 150 MG tablet Take one tablet today; may repeat in 3 days if symptoms persist   furosemide (LASIX) 40 MG tablet TAKE 1 TABLET(40 MG) BY MOUTH DAILY (Patient taking differently: Take 40 mg by mouth in the morning.)   gabapentin (NEURONTIN) 300 MG capsule Take 2 capsules (600 mg total) by mouth at bedtime.   insulin glargine (LANTUS) 100 UNIT/ML injection Inject 40 Units into the skin every morning.   iron polysaccharides (NIFEREX) 150 MG capsule Take 1 capsule (150 mg total) by mouth daily.   meclizine (ANTIVERT) 25 MG tablet Take 0.5-1 tablets (12.5-25 mg total) by mouth 2 (two) times daily as needed for dizziness.   meloxicam (MOBIC) 7.5 MG tablet Take 7.5 mg by mouth daily as needed for pain.   metFORMIN (GLUCOPHAGE) 1000 MG tablet Take 1 tablet (1,000 mg total) by mouth 2 (two) times daily with a meal.   metoprolol tartrate (LOPRESSOR) 50 MG tablet Take 1 tablet (50 mg total) by mouth 2 (two) times daily.   pantoprazole (PROTONIX) 40 MG tablet TAKE 1 TABLET BY MOUTH DAILY AT NOON (Patient taking differently: Take 40 mg by mouth daily at 12 noon.)   polyethylene glycol (MIRALAX / GLYCOLAX) 17 g packet Take 17 g by mouth daily.   tamsulosin (FLOMAX) 0.4 MG CAPS capsule Take 1 capsule (0.4 mg total) by mouth daily.   traMADol (ULTRAM) 50 MG tablet Take 1 tablet (50 mg total) by mouth 2 (two) times daily as needed. (Patient taking differently: Take 50 mg by mouth 2 (two) times daily as needed for moderate pain (pain score 4-6).)   TYLENOL 500 MG tablet Take 500-1,000 mg by mouth every 6 (six) hours as  needed for mild pain or headache.    Allergies: Patient is allergic to septra [sulfamethoxazole-trimethoprim], cefadroxil, ciprofloxacin, and erythromycin. Family History: Patient family history includes Brain cancer in his father; Throat cancer in his brother. Social History:  Patient  reports that he quit smoking about 65 years ago. His smoking use included cigarettes. He started smoking about 75 years ago. He has a 30 pack-year smoking history. He has never used smokeless tobacco. He reports that he does not drink alcohol and does not use drugs.  Review of Systems: Constitutional: Negative for fever malaise or anorexia Cardiovascular: negative for chest pain Respiratory:  negative for SOB or persistent cough Gastrointestinal: negative for abdominal pain  Objective  Vitals: BP 134/66   Pulse 85   Temp 98.4 F (36.9 C)   Ht 5\' 5"  (1.651 m)   Wt 191 lb 6.4 oz (86.8 kg)   SpO2 99%   BMI 31.85 kg/m  General: no acute distress , A&Ox3 HEENT: PEERL, conjunctiva normal, neck is supple Cardiovascular:  RRR without murmur or gallop.  Respiratory:  Good breath sounds bilaterally, CTAB with normal respiratory effort Skin:  Warm, no rashes  Commons side effects, risks, benefits, and alternatives for medications and treatment plan prescribed today were discussed, and the patient expressed understanding of the given instructions. Patient is instructed to call or message via MyChart if he/she has any questions or concerns regarding our treatment plan. No barriers to understanding were identified. We discussed Red Flag symptoms and signs in detail. Patient expressed understanding regarding what to do in case of urgent or emergency type symptoms.  Medication list was reconciled, printed and provided to the patient in AVS. Patient instructions and summary information was reviewed with the patient as documented in the AVS. This note was prepared with assistance of Dragon voice recognition software.  Occasional wrong-word or sound-a-like substitutions may have occurred due to the inherent limitations of voice recognition software

## 2023-05-22 NOTE — Progress Notes (Unsigned)
 Cardiology Office Note   Date:  05/26/2023   ID:  Jason, Frisbee Apr 02, 1928, MRN [id number]  PCP:  [name], MD  Cardiologist:  Morganna Styles [place], MD EP: None  Chief Complaint  Patient presents with   Atrial Fibrillation   Coronary Artery Disease       History of Present Illness: [name] is a 89 y.o. male with PMH of CAD s/p CABG in 1990 with subsequent DES to RCA in 2006, permanent atrial fibrillation, chronic diastolic CHF, aortic stenosis, HTN, HLD, carotid artery disease, DM type 2, CVA, who presents for  follow-up.   He was  admitted to the hospital 05/13/19-05/18/19 for acute respiratory failure 2/2 PNA and pleural effusion, managed with IV antibiotics and a thoracentesis. Cardiology followed that admission for assistance with atrial fibrillation with RVR and he was discharged home on diltiazem 360mg  and metoprolol succinate 100mg  BID for rate control, apixaban 5mg  BID for stroke ppx, and lasix 40mg  daily for CHF. Echo 05/14/19 showed EF 60-65%, moderate concentric LVH, indeterminate LV diastolic function, mild AI, moderate AS, and ascending aortic aneurysm of 4.8cm (up from 4.3cm 09/2018).   He was admitted 8/6-10/07/21 with sepsis due to PNA. Managed with antibiotics. He was readmitted on 8/12-8/14 with edema and some CHF. He was continued on therapy for PNA. Swallowing evaluation was negative for aspiration. BNP 207.5. Echocardiogram with EF 55 to 60%, no regional wall motion abnormality, increasing right ventricular pressure and volume overload.  No significant change from previous. Responded to diuretics. DC on lasix 40 mg bid.   In October he was treated for acute bronchitis.  He presented to the ED on 05/01/2022 with shortness of breath, dizziness, and presyncope. Prior to this he was following with GI for ongoing issues with GI bleeding and progressive blood loss anemia.  He was scheduled for outpatient colonoscopy.  Upon arrival to the ED he was  hypotensive and bradycardic (HR in the 40s-60s). Cardiology was consulted in the setting of bradycardia.  Toprol and diltiazem were held with improvement in HR.  Eliquis was held in the setting of GI bleed.  He tolerated reintroduction of low-dose metoprolol.  Repeat echocardiogram showed progressive aortic stenosis, moderate to severe. Endoscopy showed no significant bleeding.  He was noted to have moderate hiatal hernia.  He did have a number of colon polyps.  GI recommended resuming anticoagulation with Sunday after hospital discharge.  He was discharged home in stable condition on 05/06/2022. He was seen in our office on March 22 and was doing well. He did have Covid 19 but this was mild.    He was seen in the ED in October for left arm pain. Some dyspnea as well. Ecg showed no change. Troponins negative. BNP no change. CXR clear. Given GI cocktail with relief. Denies any chest pain, dyspnea or palpitations. Has some neuropathy in his feet.   He was hospitalized in November 2024 in the setting of sepsis due to community-acquired pneumonia with acute hypoxic respiratory failure.  He was treated with antibiotics. Cardiology was consulted in the setting of atrial fibrillation with RVR.  Diltiazem was restarted.  On follow up today he is doing well. No palpitations. No chest pain. Wife notes he gets SOB with exertion. He did have a minor fall recently. Planning to go to [place] this weekend to gamble.       Past Medical History:  Diagnosis Date   Abdominal pain    Acute renal failure (HCC)  resolved   Aortic stenosis    Arthritis    "hands & legs" (11/'10/2014)   Ataxia    Bladder outlet obstruction    BPH (benign prostatic hyperplasia)    CAD (coronary artery disease)    a. CABG IN 1989. b. 01/08/2015 CTO of ost LAD, LIMA to LAD not visualized but assumed patent given myoview finding, occluded SVG to diagonal, 99% mid RCA tx w/ SYNERGY DES 3X28 mm   Chronic heart failure with preserved  ejection fraction (HFpEF) (HCC)    Constipation    Diabetes mellitus type 2, insulin dependent (HCC)    Epistaxis, recurrent 04/2015   GERD (gastroesophageal reflux disease)    HTN (hypertension)    Hx of bacterial pneumonia    Hyperlipemia    Kidney stones    Mild cognitive impairment 03/05/2021   MMSE 26/30 and 6CIT 9 03/2021 AWV   Permanent atrial fibrillation (HCC)    Pneumonia 04/2019   Rib fractures    left rib fractures being treated with pain medications   Thoracic aortic aneurysm (HCC)    Urinary tract infection     Enterococcus    Past Surgical History:  Procedure Laterality Date   CARDIAC CATHETERIZATION  1989   CARDIAC CATHETERIZATION N/A 01/08/2015   Procedure: Left Heart Cath and Cors/Grafts Angiography;  Surgeon: Indiana Pechacek M [place], MD;  Location: [facility] INVASIVE CV LAB;  Service: Cardiovascular;  Laterality: N/A;   CARDIAC CATHETERIZATION  01/08/2015   Procedure: Coronary Stent Intervention;  Surgeon: Dennie Moltz M [place], MD;  Location: [facility] INVASIVE CV LAB;  Service: Cardiovascular;;   CARPAL TUNNEL RELEASE Right 08/2010   CATARACT EXTRACTION W/ INTRAOCULAR LENS  IMPLANT, BILATERAL Bilateral    COLONOSCOPY WITH PROPOFOL N/A 05/05/2022   Procedure: COLONOSCOPY WITH PROPOFOL;  Surgeon: Hilarie Fredrickson, MD;  Location: [facility] ENDOSCOPY;  Service: Gastroenterology;  Laterality: N/A;   CORONARY ANGIOPLASTY  01/08/15   RCA DES   CORONARY ARTERY BYPASS GRAFT  1989   "CABG X 2"   ESOPHAGOGASTRODUODENOSCOPY (EGD) WITH PROPOFOL N/A 05/05/2022   Procedure: ESOPHAGOGASTRODUODENOSCOPY (EGD) WITH PROPOFOL;  Surgeon: Hilarie Fredrickson, MD;  Location: [facility] ENDOSCOPY;  Service: Gastroenterology;  Laterality: N/A;   IR THORACENTESIS ASP PLEURAL SPACE W/IMG GUIDE  05/14/2019   JOINT REPLACEMENT     POLYPECTOMY  05/05/2022   Procedure: POLYPECTOMY;  Surgeon: Hilarie Fredrickson, MD;  Location: [facility] ENDOSCOPY;  Service: Gastroenterology;;   TONSILLECTOMY     TOTAL HIP ARTHROPLASTY Right 2000     Current Outpatient  Medications  Medication Sig Dispense Refill   albuterol (VENTOLIN HFA) 108 (90 Base) MCG/ACT inhaler Inhale 1-2 puffs into the lungs every 6 (six) hours as needed for wheezing or shortness of breath. 1 each 0   apixaban (ELIQUIS) 5 MG TABS tablet Take 1 tablet (5 mg total) by mouth 2 (two) times daily. 60 tablet 2   atorvastatin (LIPITOR) 40 MG tablet Take 1 tablet (40 mg total) by mouth at bedtime.     AVODART 0.5 MG capsule Take 0.5 mg by mouth every other day.     Cholecalciferol (VITAMIN D3 PO) Take 50 mcg by mouth daily.     diltiazem (CARDIZEM CD) 120 MG 24 hr capsule Take 1 capsule (120 mg total) by mouth daily. 30 capsule 0   fluconazole (DIFLUCAN) 150 MG tablet Take one tablet today; may repeat in 3 days if symptoms persist 2 tablet 0   furosemide (LASIX) 40 MG tablet TAKE 1 TABLET(40 MG) BY MOUTH DAILY (Patient taking differently:  Take 40 mg by mouth in the morning.) 90 tablet 3   gabapentin (NEURONTIN) 300 MG capsule Take 2 capsules (600 mg total) by mouth at bedtime. 180 capsule 5   insulin glargine (LANTUS) 100 UNIT/ML injection Inject 40 Units into the skin every morning.     iron polysaccharides (NIFEREX) 150 MG capsule Take 1 capsule (150 mg total) by mouth daily. 100 capsule 3   meclizine (ANTIVERT) 25 MG tablet Take 0.5-1 tablets (12.5-25 mg total) by mouth 2 (two) times daily as needed for dizziness. 30 tablet 0   meloxicam (MOBIC) 7.5 MG tablet Take 7.5 mg by mouth daily as needed for pain.     metFORMIN (GLUCOPHAGE) 1000 MG tablet Take 1 tablet (1,000 mg total) by mouth 2 (two) times daily with a meal. 180 tablet 3   metoprolol tartrate (LOPRESSOR) 50 MG tablet Take 1 tablet (50 mg total) by mouth 2 (two) times daily. 180 tablet 3   pantoprazole (PROTONIX) 40 MG tablet TAKE 1 TABLET BY MOUTH DAILY AT NOON (Patient taking differently: Take 40 mg by mouth daily at 12 noon.) 90 tablet 3   polyethylene glycol (MIRALAX / GLYCOLAX) 17 g packet Take 17 g by mouth daily.      tamsulosin (FLOMAX) 0.4 MG CAPS capsule Take 1 capsule (0.4 mg total) by mouth daily. 90 capsule 3   traMADol (ULTRAM) 50 MG tablet Take 1 tablet (50 mg total) by mouth 2 (two) times daily as needed. (Patient taking differently: Take 50 mg by mouth 2 (two) times daily as needed for moderate pain (pain score 4-6).) 60 tablet 5   TYLENOL 500 MG tablet Take 500-1,000 mg by mouth every 6 (six) hours as needed for mild pain or headache.     No current facility-administered medications for this visit.    Allergies:   Septra [sulfamethoxazole-trimethoprim], Cefadroxil, Ciprofloxacin, and Erythromycin    Social History:  The patient  reports that he quit smoking about 65 years ago. His smoking use included cigarettes. He started smoking about 75 years ago. He has a 30 pack-year smoking history. He has never used smokeless tobacco. He reports that he does not drink alcohol and does not use drugs.   Family History:  The patient's family history includes Brain cancer in his father; Throat cancer in his brother.    ROS:  Please see the history of present illness.   Otherwise, review of systems are positive for none.   All other systems are reviewed and negative.    PHYSICAL EXAM: VS:  BP 138/68   Pulse 89   Ht 5\' 5"  (1.651 m)   Wt 195 lb 9.6 oz (88.7 kg)   SpO2 95%   BMI 32.55 kg/m  , BMI Body mass index is 32.55 kg/m. GEN: Well nourished, well developed, in no acute distress. Walks with cane HEENT: normal Neck: no JVD, carotid bruits, or masses Cardiac: IRRR; gr 2/6 systolic murmur RUSB>>apex, no rubs. No edema. Respiratory:  clear to auscultation bilaterally, normal work of breathing GI: soft, nontender, nondistended, + BS MS: no deformity or atrophy. No biceps tenderness Skin: warm and dry, no rash Neuro:  Strength and sensation are intact Psych: euthymic mood, full affect          Recent Labs: 01/09/2023: ALT 14 01/11/2023: TSH 2.093 01/15/2023: Magnesium 2.2 04/04/2023: B  Natriuretic Peptide 255.1; BUN 15; Creatinine, Ser 1.12; Hemoglobin 9.9; Platelets 285; Potassium 3.6; Sodium 141    Lipid Panel    Component Value Date/Time  CHOL 147 03/11/2022 1054   TRIG 75.0 03/11/2022 1054   HDL 52.30 03/11/2022 1054   CHOLHDL 3 03/11/2022 1054   VLDL 15.0 03/11/2022 1054   LDLCALC 80 03/11/2022 1054      Wt Readings from Last 3 Encounters:  05/26/23 195 lb 9.6 oz (88.7 kg)  05/18/23 191 lb 6.4 oz (86.8 kg)  05/09/23 199 lb 3.2 oz (90.4 kg)      Other studies Reviewed: Additional studies/ records that were reviewed today include:   2D echo 05/14/2019 IMPRESSIONS    1. Left ventricular ejection fraction, by estimation, is 60 to 65%. The  left ventricle has normal function. The left ventricle has no regional  wall motion abnormalities. There is moderate concentric left ventricular  hypertrophy. Left ventricular  diastolic parameters are indeterminate.   2. Right ventricular systolic function is normal. The right ventricular  size is normal.   3. The mitral valve is normal in structure. No evidence of mitral valve  regurgitation. No evidence of mitral stenosis.   4. The aortic valve is normal in structure. Aortic valve regurgitation is  mild. Moderate aortic valve stenosis. Aortic valve area, by VTI measures  1.27 cm. Aortic valve mean gradient measures 19.3 mmHg. Aortic valve Vmax  measures 2.75 m/s.   5. Compared with th echo 09/2018, ascending aorta has increased from 4.3  cm to 4.8cm. . Aortic dilatation noted. There is moderate dilatation of  the ascending aorta measuring 48 mm.   6. The inferior vena cava is normal in size with greater than 50%  respiratory variability, suggesting right atrial pressure of 3 mmHg.   Left heart catheterization 2016: Prox RCA lesion, 40% stenosed. LM lesion, 20% stenosed. Ost LAD to Prox LAD lesion, 100% stenosed. SVG . Origin lesion, 100% stenosed. Mid RCA-2 lesion, 70% stenosed. Mid RCA-1 lesion, 99%  stenosed. Post intervention, there is a 0% residual stenosis.   1. Severe 2 vessel obstructive CAD. CTO of the origin of the LAD. Critical mid RCA stenosis. 2. Occluded SVG to the diagonal 3. LIMA to the LAD was not visualized but assumed patent based on clinical history and Myoview findings. 4. Normal LV EDP. 5. Successful stenting of the Mid RCA with DES. Very difficult procedure due to vessel tortuosity.   Plan: DAPT with ASA and Plavix for one month then stop ASA and continue Plavix for at least one year. Resume Coumadin tomorrow. Will assess LV function with an Echo. Stop prilosec and start protonix. Anticipate DC in am if stable. Patient noted to be bradycardic throughout case so will reduce metoprolol to 50 mg daily.  CT CHEST WITHOUT CONTRAST   TECHNIQUE: Multidetector CT imaging of the chest was performed following the standard protocol without IV contrast.   COMPARISON:  CT angiography from June 02, 2016 and from October 03, 2017   FINDINGS: Cardiovascular: Post median sternotomy for CABG. Ascending thoracic aorta measuring 4.3 cm with stable appearance. Calcified atheromatous plaque of the thoracic aorta and its branches in the chest.   Heart size is stable, extensive calcified coronary artery disease post CABG and LIMA grafting.   Engorged central pulmonary vasculature 3.2 cm caliber of the main pulmonary artery Limited assessment of cardiovascular structures given lack of intravenous contrast.   Mediastinum/Nodes: Thoracic inlet structures are normal. No axillary lymphadenopathy. No mediastinal lymphadenopathy. No gross hilar lymphadenopathy.   Lungs/Pleura: Bandlike opacity with mild ground-glass attenuation in the LEFT lung base. Bandlike changes are present in the LEFT lower lobe. Resolution of pleural fluid  seen on previous chest x-rays from March of 2021   Pulmonary nodule in the superior segment of the RIGHT lower lobe 6 mm unchanged from previous imaging  (image 49, series 3) no consolidation. No pleural effusion.   (Image 72, series 3) 4 mm nodule with some surrounding ground-glass opacity in the central LEFT lower lobe   Airways are patent.   Upper Abdomen: Incidental imaging of upper abdominal contents with signs of vascular disease of the abdominal aorta. Small hiatal hernia. No acute upper abdominal process.   Musculoskeletal: No acute bone finding. No destructive bone process.   IMPRESSION: 1. Stable appearance of the mild aneurysmal dilation ascending thoracic aorta measuring 4.3 cm. Recommend annual imaging followup by CTA or MRA. This recommendation follows 2010 ACCF/AHA/AATS/ACR/ASA/SCA/SCAI/SIR/STS/SVM Guidelines for the Diagnosis and Management of Patients with Thoracic Aortic Disease. Circulation. 2010; 121: Q469-G295. Aortic aneurysm NOS (ICD10-I71.9) 2. Stable 6 mm nodule in the superior segment of the RIGHT lower lobe. 3. 4 mm nodule with some surrounding ground-glass opacity in the central LEFT lower lobe. This is new compared to prior imaging. No follow-up needed if patient is low-risk. Non-contrast chest CT can be considered in 12 months if patient is high-risk. This recommendation follows the consensus statement: Guidelines for Management of Incidental Pulmonary Nodules Detected on CT Images: From the Fleischner Society 2017; Radiology 2017; 284:228-243. 4. Resolution of pleural fluid seen on previous chest x-rays from March of 2021. Bandlike opacity in the area of prior LEFT lower lobe airspace disease suggests mixture of atelectasis and post infectious scarring. 5. Stable signs of vascular disease of the thoracic aorta and its branches in the chest. 6. Aortic atherosclerosis.   Aortic Atherosclerosis (ICD10-I70.0).   Aortic aneurysm NOS (ICD10-I71.9).     Electronically Signed   By: Donzetta Kohut M.D.   On: 02/05/2020 08:23  Echo 06/10/20: IMPRESSIONS     1. Left ventricular ejection fraction, by  estimation, is 60 to 65%. The  left ventricle has normal function. The left ventricle has no regional  wall motion abnormalities. There is moderate left ventricular hypertrophy.  Left ventricular diastolic function   could not be evaluated.   2. Right ventricular systolic function is mildly reduced. The right  ventricular size is normal. There is moderately elevated pulmonary artery  systolic pressure.   3. Left atrial size was severely dilated.   4. Right atrial size was moderately dilated.   5. The mitral valve is abnormal. Trivial mitral valve regurgitation.   6. The aortic valve is tricuspid. There is moderate calcification of the  aortic valve. Aortic valve regurgitation is trivial. Mild to moderate  aortic valve stenosis. Aortic valve area, by VTI measures 1.47 cm. Aortic  valve mean gradient measures 18.0  mmHg. Aortic valve Vmax measures 2.78 m/s. DI is 0.30.   7. Aortic dilatation noted. There is mild dilatation of the aortic root,  measuring 43 mm. There is mild dilatation of the ascending aorta,  measuring 43 mm.   8. The inferior vena cava is normal in size with greater than 50%  respiratory variability, suggesting right atrial pressure of 3 mmHg.   Comparison(s): No significant change from prior study. 05/14/2019: LVEF  60-65%, moderate LVH, moderate AS - mean gradient 19.3 mmHg.  IMPRESSIONS     1. Left ventricular ejection fraction, by estimation, is 55 to 60%. The  left ventricle has normal function. The left ventricle has no regional  wall motion abnormalities. There is moderate concentric left ventricular  hypertrophy. Left ventricular  diastolic function could not be evaluated. There is the interventricular  septum is flattened in systole and diastole, consistent with right  ventricular pressure and volume overload.   2. Right ventricular systolic function is mildly reduced. The right  ventricular size is mildly enlarged. There is normal pulmonary artery   systolic pressure. The estimated right ventricular systolic pressure is  15.7 mmHg.   3. Left atrial size was mildly dilated.   4. The mitral valve is degenerative. Trivial mitral valve regurgitation.  No evidence of mitral stenosis.   5. The aortic valve is tricuspid. There is moderate calcification of the  aortic valve. There is moderate thickening of the aortic valve. Aortic  valve regurgitation is trivial. Mild to moderate aortic valve stenosis.  Aortic valve area, by VTI measures  1.05 cm. Aortic valve mean gradient measures 19.3 mmHg. Aortic valve Vmax  measures 2.92 m/s.   6. Asc aorta up to 40 mm on this study. 48 mm on last study. CT scan  recorded 43 mm. Would defer to cross sectional imaging (CT). There is mild  dilatation of the ascending aorta, measuring 40 mm.   7. The inferior vena cava is normal in size with greater than 50%  respiratory variability, suggesting right atrial pressure of 3 mmHg.   Comparison(s): No significant change from prior study.  Echo 10/10/21:    Echo 05/02/22: IMPRESSIONS     1. Left ventricular ejection fraction, by estimation, is 60 to 65%. The  left ventricle has normal function. The left ventricle has no regional  wall motion abnormalities. There is mild left ventricular hypertrophy.  Left ventricular diastolic parameters  are indeterminate.   2. Right ventricular systolic function was not well visualized. The right  ventricular size is not well visualized. RV is poorly visualized but  function appears moderately reduced   3. The mitral valve is normal in structure. No evidence of mitral valve  regurgitation. No evidence of mitral stenosis.   4. Left atrial size was severely dilated.   5. Aortic dilatation noted. There is dilatation of the ascending aorta,  measuring 40 mm.   6. The aortic valve is tricuspid. There is severe calcification of the  aortic valve. Aortic valve regurgitation is mild. Moderate to severe  aortic valve  stenosis. Moderate AS by gradients (MG 24 mmHg, Vmax 3.0  m/s), severe by AVA (0.7cm^2) and DI  (0.24). Low SV index (28 cc/m^2), suspect paradoxical low flow low  gradient severe AS    ASSESSMENT AND PLAN:  1. CAD s/p CABG in 1990 with DES to RCA in 2006:  - Not on aspirin given need for anticoagulation - no significant angina - continue Cardizem, metoprolol, statin  2. Permanent atrial fibrillation: HR is well controlled.  - No overt bleeding - continue metoprolol and diltiazem  - Continue eliquis  - had panendoscopy as noted. - last Hgb normal.  3. Acute on Chronic diastolic CHF: - appears euvolemic today - patient taking lasix every  day. - restrict salt intake   4. HTN: BP is well controlled - Continue metoprolol succinate, diltiazem, and lasix  5. Aortic stenosis: moderate on echo 05/14/19- repeat in August 2023  is unchanged.   6. Ascending aortic aneurysm: last echo 05/14/19 showed 4.8cm aneurysm, up from 4.3 09/2018. More recent CT 02/05/20 showed it was 4.3 cm. Stable. Stable by recent Echo. Not really a candidate for surgery. No follow up needed       Disposition:   FU 6 months  Signed, Nami Strawder [place], MD  05/26/2023 11:53 AM

## 2023-05-26 ENCOUNTER — Ambulatory Visit: Payer: Medicare Other | Attending: Cardiology | Admitting: Cardiology

## 2023-05-26 ENCOUNTER — Encounter: Payer: Self-pay | Admitting: Cardiology

## 2023-05-26 VITALS — BP 138/68 | HR 89 | Ht 65.0 in | Wt 195.6 lb

## 2023-05-26 DIAGNOSIS — I5032 Chronic diastolic (congestive) heart failure: Secondary | ICD-10-CM | POA: Diagnosis not present

## 2023-05-26 DIAGNOSIS — I2510 Atherosclerotic heart disease of native coronary artery without angina pectoris: Secondary | ICD-10-CM | POA: Insufficient documentation

## 2023-05-26 DIAGNOSIS — I10 Essential (primary) hypertension: Secondary | ICD-10-CM | POA: Diagnosis not present

## 2023-05-26 DIAGNOSIS — I482 Chronic atrial fibrillation, unspecified: Secondary | ICD-10-CM | POA: Diagnosis not present

## 2023-05-26 NOTE — Patient Instructions (Signed)
 Medication Instructions:  Continue same medications *If you need a refill on your cardiac medications before your next appointment, please call your pharmacy*   Lab Work: None ordered   Testing/Procedures: None ordered   Follow-Up: At [facility], you and your health needs are our priority.  As part of our continuing mission to provide you with exceptional heart care, we have created designated Provider Care Teams.  These Care Teams include your primary Cardiologist (physician) and Advanced Practice Providers (APPs -  Physician Assistants and Nurse Practitioners) who all work together to provide you with the care you need, when you need it.  We recommend signing up for the patient portal called "MyChart".  Sign up information is provided on this After Visit Summary.  MyChart is used to connect with patients for Virtual Visits (Telemedicine).  Patients are able to view lab/test results, encounter notes, upcoming appointments, etc.  Non-urgent messages can be sent to your provider as well.   To learn more about what you can do with MyChart, go to [email or web address].    Your next appointment:  6 months   Call in June to schedule Sept appointment     Provider:  Dr.Jordan

## 2023-05-31 ENCOUNTER — Encounter: Payer: Self-pay | Admitting: Podiatry

## 2023-05-31 ENCOUNTER — Ambulatory Visit (INDEPENDENT_AMBULATORY_CARE_PROVIDER_SITE_OTHER): Payer: Medicare Other | Admitting: Podiatry

## 2023-05-31 DIAGNOSIS — D689 Coagulation defect, unspecified: Secondary | ICD-10-CM | POA: Diagnosis not present

## 2023-05-31 DIAGNOSIS — B351 Tinea unguium: Secondary | ICD-10-CM

## 2023-05-31 DIAGNOSIS — E1142 Type 2 diabetes mellitus with diabetic polyneuropathy: Secondary | ICD-10-CM

## 2023-05-31 DIAGNOSIS — M79674 Pain in right toe(s): Secondary | ICD-10-CM

## 2023-05-31 DIAGNOSIS — R3914 Feeling of incomplete bladder emptying: Secondary | ICD-10-CM | POA: Diagnosis not present

## 2023-05-31 DIAGNOSIS — Z125 Encounter for screening for malignant neoplasm of prostate: Secondary | ICD-10-CM | POA: Diagnosis not present

## 2023-05-31 NOTE — Progress Notes (Signed)
This patient returns to my office for at risk foot care.  This patient requires this care by a professional since this patient will be at risk due to having diabetic neuropathy and coagulation defect.  Patient is taking eliquiss.  Patient says toenails are painful walking and wearing his shoes.  This patient presents for at risk foot care today. ? ?General Appearance  Alert, conversant and in no acute stress. ? ?Vascular  Dorsalis pedis and posterior tibial  pulses are weakly  palpable  bilaterally.  Capillary return is within normal limits  bilaterally. Temperature is within normal limits  bilaterally. ? ?Neurologic  Senn-Weinstein monofilament wire test within normal limits  bilaterally. Muscle power within normal limits bilaterally. ? ?Nails Thick disfigured discolored nails with subungual debris  from hallux to fifth toes bilaterally. No evidence of bacterial infection or drainage bilaterally. ? ?Orthopedic  No limitations of motion  feet .  No crepitus or effusions noted.  No bony pathology or digital deformities noted. ? ?Skin  normotropic skin with no porokeratosis noted bilaterally.  No signs of infections or ulcers noted.    ? ?Onychomycosis  B/L ? ?Consent was obtained for treatment procedures.   Debridement of nails with nail nipper followed by dremel tool. Filed with dremel without incident.  ? ? ?Return office visit    10   weeks                Told patient to return for periodic foot care and evaluation due to potential at risk complications. ? ? ?Helane Gunther DPM  ?

## 2023-06-01 ENCOUNTER — Telehealth: Payer: Self-pay

## 2023-06-01 NOTE — Telephone Encounter (Signed)
 Copied from CRM [id number]. Topic: Clinical - Medication Question >> May 30, 2023  1:47 PM Almira Coaster wrote: Reason for CRM: Lenn Sink is calling requesting recent office notes from [facility] With an updated list on patients medication as there is a discrepancy on the list that they have and the list that the patient provided. They will be sending over a signed release of records request to the office as well. They would need this urgently to keep refilling patient prescriptions. Call back number [phone].

## 2023-06-02 ENCOUNTER — Encounter: Payer: Self-pay | Admitting: Cardiology

## 2023-06-06 ENCOUNTER — Ambulatory Visit (INDEPENDENT_AMBULATORY_CARE_PROVIDER_SITE_OTHER): Admitting: Family Medicine

## 2023-06-06 ENCOUNTER — Encounter: Payer: Self-pay | Admitting: Family Medicine

## 2023-06-06 VITALS — BP 119/63 | HR 91 | Temp 97.7°F | Ht 65.0 in | Wt 196.2 lb

## 2023-06-06 DIAGNOSIS — E1159 Type 2 diabetes mellitus with other circulatory complications: Secondary | ICD-10-CM

## 2023-06-06 DIAGNOSIS — Z794 Long term (current) use of insulin: Secondary | ICD-10-CM

## 2023-06-06 DIAGNOSIS — I152 Hypertension secondary to endocrine disorders: Secondary | ICD-10-CM | POA: Diagnosis not present

## 2023-06-06 DIAGNOSIS — E1121 Type 2 diabetes mellitus with diabetic nephropathy: Secondary | ICD-10-CM | POA: Diagnosis not present

## 2023-06-06 DIAGNOSIS — I482 Chronic atrial fibrillation, unspecified: Secondary | ICD-10-CM | POA: Diagnosis not present

## 2023-06-06 MED ORDER — DILTIAZEM HCL ER COATED BEADS 120 MG PO CP24: 120.0000 mg | ORAL_CAPSULE | Freq: Every day | ORAL | 3 refills | Status: DC

## 2023-06-06 NOTE — Progress Notes (Signed)
 Subjective  CC:  Chief Complaint  Patient presents with   Medication Problem    [facility] stated [facility] 360mg  but his chart says 120mg  and he wants to make sure of the correct dosage he is supposed to be taking     HPI: [name] is a 89 y.o. male who presents to the office today to address the problems listed above in the chief complaint. Patient with chronic A-fib and hypertension presents with questions regarding his dosing of diltiazem as noted above.  In November he was hospitalized with pneumonia, he was slightly bradycardic and hypotensive.  Medications were held and he was discharged on a lower dose of diltiazem at 120 mg daily.  It seems however that he was only on that dose for about a month as the [place] sent him a refill at 360 mg and he has been taking that ever since.  He does admit to orthostatic dizziness but denies palpitations or recent chest pain.  I reviewed notes from cardiology in December.  I reviewed my notes.  Blood pressures have been running normal to low normal. Diabetes: He was having steroid-induced hyperglycemia which has since resolved.  However now he has noticed some asymptomatic hypoglycemia in the morning.  He is decreased his dose down slowly to 32 units at night of Lantus.  Sugars are responding appropriately although still running a little lower than I would like, 80s and 90s in the morning.  Assessment  1. Chronic atrial fibrillation (HCC)   2. Hypertension associated with diabetes (HCC)   3. Type 2 diabetes mellitus with diabetic nephropathy, with long-term current use of insulin (HCC)      Plan   Hypertension and chronic afib f/u: BP control is well controlled. But given sxs of orthostatic hypotension, will decrease diltiazem to 120 mg daily.  Will monitor blood pressure and heart rate.  Monitor symptoms.  Patient understands and agrees.  New prescription sent in. Diabetes: Hyperglycemia has resolved.  Now with morning  lows.  Will decrease to 30 units nightly of Lantus and continue to monitor.  That should get him stable again.  He is eating well.  No other symptoms.  Education regarding management of these chronic disease states was given. Management strategies discussed on successive visits include dietary and exercise recommendations, goals of achieving and maintaining IBW, and lifestyle modifications aiming for adequate sleep and minimizing stressors.   Follow up: as scheduled  No orders of the defined types were placed in this encounter.  Meds ordered this encounter  Medications   diltiazem (CARDIZEM CD) 120 MG 24 hr capsule    Sig: Take 1 capsule (120 mg total) by mouth daily.    Dispense:  90 capsule    Refill:  3      BP Readings from Last 3 Encounters:  06/06/23 119/63  05/26/23 138/68  05/18/23 134/66   Wt Readings from Last 3 Encounters:  06/06/23 196 lb 3.2 oz (89 kg)  05/26/23 195 lb 9.6 oz (88.7 kg)  05/18/23 191 lb 6.4 oz (86.8 kg)    Lab Results  Component Value Date   CHOL 147 03/11/2022   CHOL 154 04/14/2021   CHOL 154 08/04/2020   Lab Results  Component Value Date   HDL 52.30 03/11/2022   HDL 54.70 04/14/2021   HDL 46.10 08/04/2020   Lab Results  Component Value Date   LDLCALC 80 03/11/2022   LDLCALC 75 04/14/2021   LDLCALC 90 08/04/2020  Lab Results  Component Value Date   TRIG 75.0 03/11/2022   TRIG 119.0 04/14/2021   TRIG 86.0 08/04/2020   Lab Results  Component Value Date   CHOLHDL 3 03/11/2022   CHOLHDL 3 04/14/2021   CHOLHDL 3 08/04/2020   No results found for: "LDLDIRECT" Lab Results  Component Value Date   CREATININE 1.12 04/04/2023   BUN 15 04/04/2023   NA 141 04/04/2023   K 3.6 04/04/2023   CL 106 04/04/2023   CO2 26 04/04/2023    The ASCVD Risk score (Arnett DK, et al., 2019) failed to calculate for the following reasons:   The 2019 ASCVD risk score is only valid for ages 16 to 72   Risk score cannot be calculated because patient  has a medical history suggesting prior/existing ASCVD  I reviewed the patients updated PMH, FH, and SocHx.    Patient Active Problem List   Diagnosis Date Noted   History of lower GI bleeding 06/10/2022    Priority: High   Mild cognitive impairment 03/05/2021    Priority: High   Moderate nonproliferative diabetic retinopathy of left eye (HCC) 06/18/2020    Priority: High   CKD stage 3a, GFR 45-59 ml/min (HCC) 05/26/2020    Priority: High   Aortic stenosis, moderate 05/22/2019    Priority: High   Chronic heart failure with preserved ejection fraction (HFpEF) (HCC) 03/14/2019    Priority: High   Spinal stenosis of lumbar region 02/21/2018    Priority: High   Diabetic peripheral neuropathy associated with type 2 diabetes mellitus (HCC) 05/05/2017    Priority: High   Atherosclerosis of native coronary artery of native heart without angina pectoris 06/13/2016    Priority: High   CVA (cerebral vascular accident) (HCC) 06/06/2016    Priority: High   Recurrent coronary arteriosclerosis after percutaneous transluminal coronary angioplasty 04/18/2015    Priority: High   CAD S/P PCI- Nov 2016     Priority: High   Chronic anticoagulation 02/06/2013    Priority: High   Chronic atrial fibrillation (HCC) 07/30/2011    Priority: High   Hx of CABG x 2 1990     Priority: High   Hypertension associated with diabetes (HCC)     Priority: High   Diabetes mellitus with diabetic nephropathy, with long-term current use of insulin (HCC)     Priority: High   Combined hyperlipidemia associated with type 2 diabetes mellitus (HCC)     Priority: High   Thoracic aortic aneurysm without rupture (HCC) 05/26/2020    Priority: Medium    Degeneration of lumbar intervertebral disc 02/21/2018    Priority: Medium    Chronic vertigo 03/15/2014    Priority: Medium    Obesity (BMI 30.0-34.9) 01/31/2012    Priority: Medium    Enlarged prostate without lower urinary tract symptoms (luts) 01/04/2011     Priority: Medium    Gastro-esophageal reflux disease without esophagitis 12/07/2010    Priority: Medium    Hiatal hernia 05/05/2022    Priority: Low   Osteoarthritis of metacarpophalangeal (MCP) joint of left thumb 01/18/2022    Priority: Low   Stable hemispheric central retinal vein occlusion (CRVO) of right eye 07/21/2020    Priority: Low   Lower extremity edema 03/14/2019    Priority: Low   Epistaxis, recurrent 04/11/2015    Priority: Low   Osteoarthritis, knee 01/31/2012    Priority: Low   Allergic rhinitis 10/28/2011    Priority: Low   Hearing loss 06/08/2011    Priority: Low  Primary osteoarthritis of hand 06/08/2011    Priority: Low   Benign hematuria 08/27/2008    Priority: Low   Stage 3b chronic kidney disease (HCC) 05/09/2023    Allergies: Septra [sulfamethoxazole-trimethoprim], Cefadroxil, Ciprofloxacin, and Erythromycin  Social History: Patient  reports that he quit smoking about 65 years ago. His smoking use included cigarettes. He started smoking about 75 years ago. He has a 30 pack-year smoking history. He has never used smokeless tobacco. He reports that he does not drink alcohol and does not use drugs.  Current Meds  Medication Sig   albuterol (VENTOLIN HFA) 108 (90 Base) MCG/ACT inhaler Inhale 1-2 puffs into the lungs every 6 (six) hours as needed for wheezing or shortness of breath.   apixaban (ELIQUIS) 5 MG TABS tablet Take 1 tablet (5 mg total) by mouth 2 (two) times daily.   atorvastatin (LIPITOR) 40 MG tablet Take 1 tablet (40 mg total) by mouth at bedtime.   AVODART 0.5 MG capsule Take 0.5 mg by mouth every other day.   Cholecalciferol (VITAMIN D3 PO) Take 50 mcg by mouth daily.   fluconazole (DIFLUCAN) 150 MG tablet Take one tablet today; may repeat in 3 days if symptoms persist   furosemide (LASIX) 40 MG tablet TAKE 1 TABLET(40 MG) BY MOUTH DAILY (Patient taking differently: Take 40 mg by mouth in the morning.)   gabapentin (NEURONTIN) 300 MG capsule  Take 2 capsules (600 mg total) by mouth at bedtime.   insulin glargine (LANTUS) 100 UNIT/ML injection Inject 30 Units into the skin every morning.   iron polysaccharides (NIFEREX) 150 MG capsule Take 1 capsule (150 mg total) by mouth daily.   meclizine (ANTIVERT) 25 MG tablet Take 0.5-1 tablets (12.5-25 mg total) by mouth 2 (two) times daily as needed for dizziness.   meloxicam (MOBIC) 7.5 MG tablet Take 7.5 mg by mouth daily as needed for pain.   metFORMIN (GLUCOPHAGE) 1000 MG tablet Take 1 tablet (1,000 mg total) by mouth 2 (two) times daily with a meal.   metoprolol tartrate (LOPRESSOR) 50 MG tablet Take 1 tablet (50 mg total) by mouth 2 (two) times daily.   pantoprazole (PROTONIX) 40 MG tablet TAKE 1 TABLET BY MOUTH DAILY AT NOON (Patient taking differently: Take 40 mg by mouth daily at 12 noon.)   polyethylene glycol (MIRALAX / GLYCOLAX) 17 g packet Take 17 g by mouth daily.   tamsulosin (FLOMAX) 0.4 MG CAPS capsule Take 1 capsule (0.4 mg total) by mouth daily.   traMADol (ULTRAM) 50 MG tablet Take 1 tablet (50 mg total) by mouth 2 (two) times daily as needed. (Patient taking differently: Take 50 mg by mouth 2 (two) times daily as needed for moderate pain (pain score 4-6).)   TYLENOL 500 MG tablet Take 500-1,000 mg by mouth every 6 (six) hours as needed for mild pain or headache.    Review of Systems: Cardiovascular: negative for chest pain, palpitations, leg swelling, orthopnea Respiratory: negative for SOB, wheezing or persistent cough Gastrointestinal: negative for abdominal pain Genitourinary: negative for dysuria or gross hematuria  Objective  Vitals: BP 119/63   Pulse 91   Temp 97.7 F (36.5 C)   Ht 5\' 5"  (1.651 m)   Wt 196 lb 3.2 oz (89 kg)   SpO2 96%   BMI 32.65 kg/m  General: no acute distress  Psych:  Alert and oriented, normal mood and affect HEENT:  Normocephalic, atraumatic, supple neck  Cardiovascular: irreg irreg Respiratory:  Good breath sounds bilaterally, CTAB  with normal  respiratory effort Neurologic:   Mental status is normal Commons side effects, risks, benefits, and alternatives for medications and treatment plan prescribed today were discussed, and the patient expressed understanding of the given instructions. Patient is instructed to call or message via MyChart if he/she has any questions or concerns regarding our treatment plan. No barriers to understanding were identified. We discussed Red Flag symptoms and signs in detail. Patient expressed understanding regarding what to do in case of urgent or emergency type symptoms.  Medication list was reconciled, printed and provided to the patient in AVS. Patient instructions and summary information was reviewed with the patient as documented in the AVS. This note was prepared with assistance of Dragon voice recognition software. Occasional wrong-word or sound-a-like substitutions may have occurred due to the inherent limitation

## 2023-06-13 ENCOUNTER — Encounter: Payer: Self-pay | Admitting: Orthopaedic Surgery

## 2023-06-22 ENCOUNTER — Other Ambulatory Visit (INDEPENDENT_AMBULATORY_CARE_PROVIDER_SITE_OTHER): Payer: Self-pay

## 2023-06-22 ENCOUNTER — Ambulatory Visit: Admitting: Orthopaedic Surgery

## 2023-06-22 DIAGNOSIS — M79645 Pain in left finger(s): Secondary | ICD-10-CM

## 2023-06-22 NOTE — Progress Notes (Signed)
 Office Visit Note   Patient: [name]           Date of Birth: 13-Nov-1928           MRN: [id number] Visit Date: 06/22/2023              Requested by: Jeffrey Saha, MD [address] [place],  [place] [address] PCP: Jeffrey Saha, MD   Assessment & Plan: Visit Diagnoses:  1. Pain of left thumb     Plan: [name] is a 89 year old gentleman with advanced left thumb CMC DJD.  Treatment options were again discussed to include surgical treatment versus conservative management in the form of bracing, OTC medications and activity modifications.  He is not interested in surgery at this time.  We provided a brace today.  He will pick up some topical Voltaren .  Will see him back as needed.  Follow-Up Instructions: No follow-ups on file.   Orders:  Orders Placed This Encounter  Procedures   XR Finger Thumb Left   No orders of the defined types were placed in this encounter.     Procedures: No procedures performed   Clinical Data: No additional findings.   Subjective: Chief Complaint  Patient presents with   Left Thumb - Pain    HPI Neck is a 89 year old gentleman here for evaluation of left thumb pain.  He has constant severe pain at the base of his left thumb that is worse with grasping and use of the left hand for ADLs.  He has had 3 injections total in each injection lasted about 2 weeks.  His most recent injection was performed by Dr. Kuzma at the hand center earlier this year. Review of Systems  Constitutional: Negative.   HENT: Negative.    Eyes: Negative.   Respiratory: Negative.    Cardiovascular: Negative.   Gastrointestinal: Negative.   Endocrine: Negative.   Genitourinary: Negative.   Skin: Negative.   Allergic/Immunologic: Negative.   Neurological: Negative.   Hematological: Negative.   Psychiatric/Behavioral: Negative.    All other systems reviewed and are negative.    Objective: Vital Signs: There were no vitals taken for this  visit.  Physical Exam Vitals and nursing note reviewed.  Constitutional:      Appearance: He is well-developed.  Pulmonary:     Effort: Pulmonary effort is normal.  Abdominal:     Palpations: Abdomen is soft.  Skin:    General: Skin is warm.  Neurological:     Mental Status: He is alert and oriented to person, place, and time.  Psychiatric:        Behavior: Behavior normal.        Thought Content: Thought content normal.        Judgment: Judgment normal.     Ortho Exam Exam of the left hand shows pain with thumb CMC grind test.  There is quite a bit of crepitus as well.  Pain with pinch.  Unable to oppose his thumb tip to the fifth metacarpal head. Specialty Comments:  No specialty comments available.  Imaging: XR Finger Thumb Left Result Date: 06/22/2023 Xrays of the thumb show advanced thumb CMC osteoarthritis. No acute abnormalities.    PMFS History: Patient Active Problem List   Diagnosis Date Noted   Stage 3b chronic kidney disease (HCC) 05/09/2023   History of lower GI bleeding 06/10/2022   Hiatal hernia 05/05/2022   Osteoarthritis of metacarpophalangeal (MCP) joint of left thumb 01/18/2022   Mild cognitive impairment 03/05/2021  Stable hemispheric central retinal vein occlusion (CRVO) of right eye 07/21/2020   Moderate nonproliferative diabetic retinopathy of left eye (HCC) 06/18/2020   Thoracic aortic aneurysm without rupture (HCC) 05/26/2020   CKD stage 3a, GFR 45-59 ml/min (HCC) 05/26/2020   Aortic stenosis, moderate 05/22/2019   Chronic heart failure with preserved ejection fraction (HFpEF) (HCC) 03/14/2019   Lower extremity edema 03/14/2019   Spinal stenosis of lumbar region 02/21/2018   Degeneration of lumbar intervertebral disc 02/21/2018   Diabetic peripheral neuropathy associated with type 2 diabetes mellitus (HCC) 05/05/2017   Atherosclerosis of native coronary artery of native heart without angina pectoris 06/13/2016   CVA (cerebral vascular  accident) (HCC) 06/06/2016   Recurrent coronary arteriosclerosis after percutaneous transluminal coronary angioplasty 04/18/2015   Epistaxis, recurrent 04/11/2015   CAD S/P PCI- Nov 2016    Chronic vertigo 03/15/2014   Chronic anticoagulation 02/06/2013   Obesity (BMI 30.0-34.9) 01/31/2012   Osteoarthritis, knee 01/31/2012   Allergic rhinitis 10/28/2011   Chronic atrial fibrillation (HCC) 07/30/2011   Hearing loss 06/08/2011   Primary osteoarthritis of hand 06/08/2011   Enlarged prostate without lower urinary tract symptoms (luts) 01/04/2011   Gastro-esophageal reflux disease without esophagitis 12/07/2010   Hx of CABG x 2 1990    Hypertension associated with diabetes (HCC)    Diabetes mellitus with diabetic nephropathy, with long-term current use of insulin  (HCC)    Combined hyperlipidemia associated with type 2 diabetes mellitus (HCC)    Benign hematuria 08/27/2008   Past Medical History:  Diagnosis Date   Abdominal pain    Acute renal failure (HCC)     resolved   Aortic stenosis    Arthritis    "hands & legs" (11/'10/2014)   Ataxia    Bladder outlet obstruction    BPH (benign prostatic hyperplasia)    CAD (coronary artery disease)    a. CABG IN 1989. b. 01/08/2015 CTO of ost LAD, LIMA to LAD not visualized but assumed patent given myoview  finding, occluded SVG to diagonal, 99% mid RCA tx w/ SYNERGY DES 3X28 mm   Chronic heart failure with preserved ejection fraction (HFpEF) (HCC)    Constipation    Diabetes mellitus type 2, insulin  dependent (HCC)    Epistaxis, recurrent 04/2015   GERD (gastroesophageal reflux disease)    HTN (hypertension)    Hx of bacterial pneumonia    Hyperlipemia    Kidney stones    Mild cognitive impairment 03/05/2021   MMSE 26/30 and 6CIT 9 03/2021 AWV   Permanent atrial fibrillation (HCC)    Pneumonia 04/2019   Rib fractures    left rib fractures being treated with pain medications   Thoracic aortic aneurysm (HCC)    Urinary tract infection      Enterococcus    Family History  Problem Relation Age of Onset   Brain cancer Father    Throat cancer Brother    Liver disease Neg Hx    Colon cancer Neg Hx    Esophageal cancer Neg Hx     Past Surgical History:  Procedure Laterality Date   CARDIAC CATHETERIZATION  1989   CARDIAC CATHETERIZATION N/A 01/08/2015   Procedure: Left Heart Cath and Cors/Grafts Angiography;  Surgeon: Peter M Swaziland, MD;  Location: [facility] INVASIVE CV LAB;  Service: Cardiovascular;  Laterality: N/A;   CARDIAC CATHETERIZATION  01/08/2015   Procedure: Coronary Stent Intervention;  Surgeon: Peter M Swaziland, MD;  Location: [facility] INVASIVE CV LAB;  Service: Cardiovascular;;   CARPAL TUNNEL RELEASE Right 08/2010  CATARACT EXTRACTION W/ INTRAOCULAR LENS  IMPLANT, BILATERAL Bilateral    COLONOSCOPY WITH PROPOFOL  N/A 05/05/2022   Procedure: COLONOSCOPY WITH PROPOFOL ;  Surgeon: Tobin Forts, MD;  Location: [facility];  Service: Gastroenterology;  Laterality: N/A;   CORONARY ANGIOPLASTY  01/08/15   RCA DES   CORONARY ARTERY BYPASS GRAFT  1989   "CABG X 2"   ESOPHAGOGASTRODUODENOSCOPY (EGD) WITH PROPOFOL  N/A 05/05/2022   Procedure: ESOPHAGOGASTRODUODENOSCOPY (EGD) WITH PROPOFOL ;  Surgeon: Tobin Forts, MD;  Location: [facility];  Service: Gastroenterology;  Laterality: N/A;   IR THORACENTESIS ASP PLEURAL SPACE W/IMG GUIDE  05/14/2019   JOINT REPLACEMENT     POLYPECTOMY  05/05/2022   Procedure: POLYPECTOMY;  Surgeon: Tobin Forts, MD;  Location: [facility];  Service: Gastroenterology;;   TONSILLECTOMY     TOTAL HIP ARTHROPLASTY Right 2000   Social History   Occupational History   Occupation: Insurance underwriter   Occupation: retired  Tobacco Use   Smoking status: Former    Current packs/day: 0.00    Average packs/day: 3.0 packs/day for 10.0 years (30.0 ttl pk-yrs)    Types: Cigarettes    Start date: 05/10/1948    Quit date: 05/11/1958    Years since quitting: 65.1   Smokeless tobacco: Never  Vaping Use   Vaping  status: Never Used  Substance and Sexual Activity   Alcohol use: No   Drug use: No   Sexual activity: Not Currently

## 2023-06-30 ENCOUNTER — Other Ambulatory Visit: Payer: Self-pay | Admitting: Family Medicine

## 2023-06-30 NOTE — Telephone Encounter (Signed)
 Copied from CRM [id number]. Topic: Clinical - Medication Refill >> Jun 30, 2023  1:21 PM [name] wrote: Most Recent Primary Care Visit:  Provider: Luevenia Saha  Department: [facility]  Visit Type: ACUTE  Date: 06/06/2023  Medication: traMADol  (ULTRAM ) 50 MG tablet  Has the patient contacted their pharmacy? Yes (Agent: If no, request that the patient contact the pharmacy for the refill. If patient does not wish to contact the pharmacy document the reason why and proceed with request.) (Agent: If yes, when and what did the pharmacy advise?)  Is this the correct pharmacy for this prescription? Yes If no, delete pharmacy and type the correct one.  This is the patient's preferred pharmacy:  [id number] - [place], [place] - [address] [place] [address] Phone: [phone] Fax: [phone]   Has the prescription been filled recently? Yes  Is the patient out of the medication? Yes  Has the patient been seen for an appointment in the last year OR does the patient have an upcoming appointment? Yes  Can we respond through MyChart? Yes  Agent: Please be advised that Rx refills may take up to 3 business days. We ask that you follow-up with your pharmacy.

## 2023-07-01 MED ORDER — TRAMADOL HCL 50 MG PO TABS: 50.0000 mg | ORAL_TABLET | Freq: Two times a day (BID) | ORAL | 5 refills | Status: DC | PRN

## 2023-07-04 ENCOUNTER — Other Ambulatory Visit: Payer: Self-pay | Admitting: Nurse Practitioner

## 2023-07-05 ENCOUNTER — Encounter: Payer: Self-pay | Admitting: Family Medicine

## 2023-07-06 ENCOUNTER — Other Ambulatory Visit: Payer: Self-pay | Admitting: Family Medicine

## 2023-07-06 ENCOUNTER — Telehealth: Payer: Self-pay

## 2023-07-06 DIAGNOSIS — Z79899 Other long term (current) drug therapy: Secondary | ICD-10-CM

## 2023-07-06 NOTE — Telephone Encounter (Signed)
 Copied from CRM [id number]. Topic: Clinical - Medication Question >> Jul 06, 2023 12:07 PM [id number] D wrote: Patient would like a call back from the nurse to go over the list of medications he was given he's not sure what he should be taking  Medication list was redone by Dr. Jonelle Neri

## 2023-07-11 ENCOUNTER — Encounter: Payer: Self-pay | Admitting: Family Medicine

## 2023-07-14 ENCOUNTER — Telehealth: Payer: Self-pay | Admitting: *Deleted

## 2023-07-14 NOTE — Progress Notes (Signed)
 Care Guide Pharmacy Note  07/14/2023 Name: [name] MRN: [id number] DOB: 1928/08/15  Referred By: Luevenia Saha, MD Reason for referral: Complex Care Management (Outreach to schedule referral with pharmacist )   [name] is a 89 y.o. year old male who is a primary care patient of Luevenia Saha, MD.  [name] was referred to the pharmacist for assistance related to: DMII  Successful contact was made with the patient to discuss pharmacy services including being ready for the pharmacist to call at least 5 minutes before the scheduled appointment time and to have medication bottles and any blood pressure readings ready for review. The patient agreed to meet with the pharmacist via telephone visit on 07/15/2023  Kandis Ormond, CMA [place]  [facility], [facility] Guide Direct Dial: [phone]  Fax: [phone] Website: [email or web address]

## 2023-07-15 ENCOUNTER — Other Ambulatory Visit: Payer: Self-pay | Admitting: Pharmacist

## 2023-07-15 ENCOUNTER — Encounter: Payer: Self-pay | Admitting: Pharmacist

## 2023-07-15 NOTE — Progress Notes (Signed)
 07/15/2023 Name: [name] MRN: [id number] DOB: [birth year]  Chief Complaint  Patient presents with   Medication Management    Nathanael Joseph Snedden is a 89 y.o. year old male who presented for a telephone visit.   They were referred to the pharmacist by their PCP for assistance in managing complex medication management.    Subjective:  Care Team: Primary Care Provider: Luevenia Saha, MD ; Next Scheduled Visit: not currently scheduled  Medication Access/Adherence  Current Pharmacy:  [id number] - [place], [place] - [address] [place] [address] Phone: [phone] Fax: [phone]   Patient reports affordability concerns with their medications: No  Patient reports access/transportation concerns to their pharmacy: No  Patient reports adherence concerns with their medications:  Yes  - patient is not sure that his last AVS reflects his actually medication list.  [facility]   Diabetes:  Current medications: Lantus  30 units once a day and metformin  1000mg  twice a day Medications tried in the past:   Current glucose readings: 109, 83, 104, 82, 73, 95, 79 Only checking blood glucose in the morning.   Patient reports hypoglycemic s/sx including dizziness, shakiness - occurs about 1 or 2 times per month.  Patient denies hyperglycemic symptoms including no polydipsia, polyphagia, nocturia, neuropathy, blurred vision. He does have occasional polyuria likely related to prostate.   Objective:  Lab Results  Component Value Date   HGBA1C 7.9 (A) 05/09/2023    Lab Results  Component Value Date   CREATININE 1.12 04/04/2023   BUN 15 04/04/2023   NA 141 04/04/2023   K 3.6 04/04/2023   CL 106 04/04/2023   CO2 26 04/04/2023    Lab Results  Component Value Date   CHOL 147 03/11/2022   HDL 52.30 03/11/2022    LDLCALC 80 03/11/2022   TRIG 75.0 03/11/2022   CHOLHDL 3 03/11/2022    Medications Reviewed Today     Reviewed by Cecilie Coffee, RPH-CPP (Pharmacist) on 07/15/23 at 1006  Med List Status: <None>   Medication Order Taking? Sig Documenting Provider Last Dose Status Informant  albuterol  (VENTOLIN  HFA) 108 (90 Base) MCG/ACT inhaler [id number]  Inhale 1-2 puffs into the lungs every 6 (six) hours as needed for wheezing or shortness of breath. Denese Finn, [name]  Active   apixaban  (ELIQUIS ) 5 MG TABS tablet [id number]  Take 1 tablet (5 mg total) by mouth 2 (two) times daily. Sheikh, Omair Clayton, DO  Active Self, Pharmacy Records  atorvastatin  (LIPITOR) 40 MG tablet [id number]  Take 1 tablet (40 mg total) by mouth at bedtime. Luevenia Saha, MD  Active Self, Pharmacy Records  AVODART  0.5 MG capsule [id number]  Take 0.5 mg by mouth every other day. [provider]  Active Self, Pharmacy Records  Cholecalciferol  (VITAMIN D3 PO) [id number]  Take 50 mcg by mouth daily. [provider]  Active Self, Pharmacy Records  diltiazem  (CARDIZEM  CD) 120 MG 24 hr capsule [id number]  Take 1 capsule (120 mg total) by mouth daily. Luevenia Saha, MD  Active   furosemide  (LASIX ) 40 MG tablet [id number]  TAKE 1 TABLET(40 MG) BY MOUTH DAILY  Patient taking differently: Take 40 mg by mouth in the morning.   Swaziland, Peter M, MD  Active Self, Pharmacy Records  gabapentin  (NEURONTIN ) 300 MG capsule [id number]  Take 2 capsules (600 mg  total) by mouth at bedtime. Luevenia Saha, MD  Active   insulin  glargine (LANTUS ) 100 UNIT/ML injection [id number]  Inject 30 Units into the skin every morning. [provider]  Active Self, Pharmacy Records  iron  polysaccharides (NIFEREX) 150 MG capsule [id number]  Take 1 capsule (150 mg total) by mouth daily. Luevenia Saha, MD  Active Self, Pharmacy Records  meclizine  (ANTIVERT ) 25 MG tablet [id number]  Take 0.5-1 tablets (12.5-25 mg total) by mouth 2 (two) times  daily as needed for dizziness. Luevenia Saha, MD  Active   meloxicam [facility]) 7.5 MG tablet [id number]  Take 7.5 mg by mouth daily as needed for pain. [provider]  Active Self, Pharmacy Records           Med Note Nanine Babcock, Zayante D   Mon Apr 04, 2023  8:04 AM) unknown  metFORMIN  (GLUCOPHAGE ) 1000 MG tablet [id number]  Take 1 tablet (1,000 mg total) by mouth 2 (two) times daily with a meal. Luevenia Saha, MD  Active   metoprolol  tartrate (LOPRESSOR ) 50 MG tablet [id number]  Take 1 tablet (50 mg total) by mouth 2 (two) times daily. [place], Peter M, MD  Active Self, Pharmacy Records  pantoprazole  (PROTONIX ) 40 MG tablet [id number]  TAKE 1 TABLET BY MOUTH DAILY AT Jenean Minus, Nelva Bang, NP  Active   polyethylene glycol (MIRALAX  / GLYCOLAX ) 17 g packet [id number]  Take 17 g by mouth daily. [provider]  Active Self, Pharmacy Records           Med Note Nanine Babcock [facility] D   Mon Apr 04, 2023  8:07 AM) unknown  tamsulosin  (FLOMAX ) 0.4 MG CAPS capsule [id number]  Take 1 capsule (0.4 mg total) by mouth daily. Luevenia Saha, MD  Active   traMADol  (ULTRAM ) 50 MG tablet [id number]  Take 1 tablet (50 mg total) by mouth 2 (two) times daily as needed for moderate pain (pain score 4-6). Luevenia Saha, MD  Active   TYLENOL  500 MG tablet [id number]  Take 500-1,000 mg by mouth every 6 (six) hours as needed for mild pain or headache. [provider]  Active Self, Pharmacy Records           Medication List for Teodoro Salvino- (DOB: 1928/10/18) - Updated 07/15/2023  Medication Reason for taking Morning Meal Noon Evening Meal Before Bedtime  Eliquis  5mg  (generic name is apixaban )  Prevents strokes / blood thinner 1 tablet  1 tablet   Diltiazem  120mg  (Cardizem )  Blood pressure / heart rate controller 1 capsule     Furosemide  40mg  (Lasix )  Fluid pill / swelling 1 tablet     Poly Iron  150mg  Iron  supplement /anemia 1 capsule      Insulin  glargine (Lantus  insulin )  Diabetes / blood  glucose 28 units     Metformin  1000mg  (Glucophage )  Diabetes / blood glucose 1 tablet  1 tablet   Metoprolol  tartrate 50mg  (Lopressor )  Blood pressure / heart 1 tablet  1 tablet   Pantoprazole  40mg  (Protonix )  Stomach / acid reflux  1 tablet    Atorvastatin  40mg  (Lipitor)  Cholesterol / prevents heart attacks     1 tablet  Gabapentin  300mg  (Neurontin )  Nerve pain    2 capsules = 600mg    Tamsulosin  0.4mg  (Flomax ) Prostate / urination    1 capsule  Tramadol  50mg  (Ultram ) Pain Take as needed up to 2 times per day; OK to continue to just take 1 tablet at night but do not take more than 2  tablets per day. 1 tablet   Dutasteride  0.5mg  (Avodart )  Prostate / urination Take 1 capsule every OTHER day  Acetaminophen  / Tylenol  Pain Can take 500mg  to 1000mg  every 6 to 8 hours as needed for pain, fever or headache. Do not take more than 3000mg  per day.      Assessment/Plan:   Medication Management: - Reviewed chart, recent office visits and refill history. Updated Epic med list.  - Education provided to patient regarding medications, uses and how to take.  - Mailed copy of medication list above to patient to help with medication adherence.   - Recommended he lower dose of Lantus  to 28 units daily due to recent low blood glucose readings.     Follow Up Plan: 2 to 4 weeks.   Cecilie Coffee, PharmD Clinical Pharmacist [facility]  Population Health [phone]

## 2023-07-18 ENCOUNTER — Other Ambulatory Visit: Payer: Self-pay | Admitting: Cardiology

## 2023-07-26 ENCOUNTER — Encounter: Payer: Self-pay | Admitting: Family Medicine

## 2023-08-02 ENCOUNTER — Encounter: Payer: Self-pay | Admitting: Family Medicine

## 2023-08-02 ENCOUNTER — Telehealth: Payer: Self-pay

## 2023-08-02 ENCOUNTER — Telehealth: Payer: Self-pay | Admitting: Family Medicine

## 2023-08-02 ENCOUNTER — Ambulatory Visit (INDEPENDENT_AMBULATORY_CARE_PROVIDER_SITE_OTHER): Admitting: Family Medicine

## 2023-08-02 ENCOUNTER — Other Ambulatory Visit: Payer: Self-pay

## 2023-08-02 VITALS — BP 135/74 | HR 85 | Temp 98.1°F | Ht 65.0 in | Wt 192.8 lb

## 2023-08-02 DIAGNOSIS — E1159 Type 2 diabetes mellitus with other circulatory complications: Secondary | ICD-10-CM

## 2023-08-02 DIAGNOSIS — M48062 Spinal stenosis, lumbar region with neurogenic claudication: Secondary | ICD-10-CM | POA: Diagnosis not present

## 2023-08-02 DIAGNOSIS — R42 Dizziness and giddiness: Secondary | ICD-10-CM | POA: Diagnosis not present

## 2023-08-02 DIAGNOSIS — N1831 Chronic kidney disease, stage 3a: Secondary | ICD-10-CM | POA: Diagnosis not present

## 2023-08-02 DIAGNOSIS — I482 Chronic atrial fibrillation, unspecified: Secondary | ICD-10-CM | POA: Diagnosis not present

## 2023-08-02 DIAGNOSIS — I152 Hypertension secondary to endocrine disorders: Secondary | ICD-10-CM

## 2023-08-02 DIAGNOSIS — Z79899 Other long term (current) drug therapy: Secondary | ICD-10-CM

## 2023-08-02 DIAGNOSIS — I5032 Chronic diastolic (congestive) heart failure: Secondary | ICD-10-CM

## 2023-08-02 DIAGNOSIS — M19042 Primary osteoarthritis, left hand: Secondary | ICD-10-CM | POA: Diagnosis not present

## 2023-08-02 NOTE — Progress Notes (Signed)
 Subjective  CC:  Chief Complaint  Patient presents with   Go over medications    HPI: [name] is a 89 y.o. male who presents to the office today to address the problems listed above in the chief complaint. 89 year old male with multiple chronic problems including diabetes, chronic A-fib, chronic heart failure, chronic kidney disease, osteoarthritis presents for medication management.  He recently had a telehealth visit with pharmacy.  I reviewed their notes.  I appreciate their assistance.  He has his medication list with him including all of his medications.  We reviewed in detail.  He is taking his medications as prescribed.  He has no specific questions.  His daughter wanted this visit. Diabetes has been well-controlled.  Lantus  dose now 28 units nightly.  Lowest glucose was this morning at 79.  No symptoms of hypoglycemia.  I have reviewed his daily fasting sugars, averaging 100.  At times they are slightly higher.  Metformin  dose is unchanged.  Most recent A1c was 7.9 which is at goal for him.  He does have some postprandial hyperglycemia. Cardiology: Chronic A-fib and heart failure is stable.  No shortness of breath or palpitations or chest pain. Intermittent vertigo: Will feel dizzy if he gets up quickly.  We have decreased his Cardizem  dose to avoid orthostatic hypotension.  He takes Cardizem  and metoprolol  for his heart failure and chronic A-fib.  Blood pressures are normal. Continues to have difficulty due to his lumbar spinal stenosis.  Complains of pain with walking.  Using a cane and will now use the electric cart while shopping.  Also with left hand arthritis that time can be debilitating.  He has seen a hand surgeon which unfortunately cannot help unless he does surgery.  He is using some pain medicine intermittent intermittently.  No longer feeling that tramadol  is helpful.  Assessment  1. Medication management   2. Chronic atrial fibrillation (HCC)   3. Chronic  heart failure with preserved ejection fraction (HFpEF) (HCC)   4. CKD stage 3a, GFR 45-59 ml/min (HCC)   5. Hypertension associated with diabetes (HCC)   6. Chronic vertigo   7. Spinal stenosis of lumbar region with neurogenic claudication   8. Primary osteoarthritis of left hand      Plan  Medication management: Current medication list is correct.  Patient is compliant.  No further questions. Her medicines are stable.  Monitor blood pressure.  Fall prevention discussed.  Due to chronic vertigo. Recommended use of the cane for balance and fall prevention.  Tramadol  as needed.  Continue Neurontin .  For lumbar spinal stenosis Hand arthritis: He will let me know what pain medications he is taking.  Follow up: 3 months to recheck diabetes blood pressure and lab work.  Due for lipids 11/03/2023  No orders of the defined types were placed in this encounter.  No orders of the defined types were placed in this encounter.     I reviewed the patients updated PMH, FH, and SocHx.    Patient Active Problem List   Diagnosis Date Noted   History of lower GI bleeding 06/10/2022    Priority: High   Mild cognitive impairment 03/05/2021    Priority: High   Moderate nonproliferative diabetic retinopathy of left eye (HCC) 06/18/2020    Priority: High   CKD stage 3a, GFR 45-59 ml/min (HCC) 05/26/2020    Priority: High   Aortic stenosis, moderate 05/22/2019    Priority: High   Chronic heart failure with preserved ejection fraction (  HFpEF) (HCC) 03/14/2019    Priority: High   Spinal stenosis of lumbar region 02/21/2018    Priority: High   Diabetic peripheral neuropathy associated with type 2 diabetes mellitus (HCC) 05/05/2017    Priority: High   Atherosclerosis of native coronary artery of native heart without angina pectoris 06/13/2016    Priority: High   CVA (cerebral vascular accident) (HCC) 06/06/2016    Priority: High   Recurrent coronary arteriosclerosis after percutaneous transluminal  coronary angioplasty 04/18/2015    Priority: High   CAD S/P PCI- Nov 2016     Priority: High   Chronic anticoagulation 02/06/2013    Priority: High   Chronic atrial fibrillation (HCC) 07/30/2011    Priority: High   Hx of CABG x 2 1990     Priority: High   Hypertension associated with diabetes (HCC)     Priority: High   Diabetes mellitus with diabetic nephropathy, with long-term current use of insulin  (HCC)     Priority: High   Combined hyperlipidemia associated with type 2 diabetes mellitus (HCC)     Priority: High   Thoracic aortic aneurysm without rupture (HCC) 05/26/2020    Priority: Medium    Degeneration of lumbar intervertebral disc 02/21/2018    Priority: Medium    Chronic vertigo 03/15/2014    Priority: Medium    Obesity (BMI 30.0-34.9) 01/31/2012    Priority: Medium    Enlarged prostate without lower urinary tract symptoms (luts) 01/04/2011    Priority: Medium    Gastro-esophageal reflux disease without esophagitis 12/07/2010    Priority: Medium    Hiatal hernia 05/05/2022    Priority: Low   Osteoarthritis of metacarpophalangeal (MCP) joint of left thumb 01/18/2022    Priority: Low   Stable hemispheric central retinal vein occlusion (CRVO) of right eye 07/21/2020    Priority: Low   Lower extremity edema 03/14/2019    Priority: Low   Epistaxis, recurrent 04/11/2015    Priority: Low   Osteoarthritis, knee 01/31/2012    Priority: Low   Allergic rhinitis 10/28/2011    Priority: Low   Hearing loss 06/08/2011    Priority: Low   Primary osteoarthritis of hand 06/08/2011    Priority: Low   Benign hematuria 08/27/2008    Priority: Low   Stage 3b chronic kidney disease (HCC) 05/09/2023   Current Meds  Medication Sig   apixaban  (ELIQUIS ) 5 MG TABS tablet Take 1 tablet (5 mg total) by mouth 2 (two) times daily.   atorvastatin  (LIPITOR) 40 MG tablet Take 1 tablet (40 mg total) by mouth at bedtime.   diltiazem  (CARDIZEM  CD) 120 MG 24 hr capsule Take 1 capsule (120 mg  total) by mouth daily.   furosemide  (LASIX ) 40 MG tablet TAKE 1 TABLET(40 MG) BY MOUTH DAILY (Patient taking differently: Take 40 mg by mouth in the morning.)   gabapentin  (NEURONTIN ) 300 MG capsule Take 2 capsules (600 mg total) by mouth at bedtime.   insulin  glargine (LANTUS ) 100 UNIT/ML injection Inject 30 Units into the skin every morning.   iron  polysaccharides (POLY-IRON  150) 150 MG capsule Take 150 mg by mouth daily.   metFORMIN  (GLUCOPHAGE ) 1000 MG tablet Take 1 tablet (1,000 mg total) by mouth 2 (two) times daily with a meal.   metoprolol  tartrate (LOPRESSOR ) 50 MG tablet TAKE 1 TABLET(50 MG) BY MOUTH TWICE DAILY   pantoprazole  (PROTONIX ) 40 MG tablet TAKE 1 TABLET BY MOUTH DAILY AT NOON   tamsulosin  (FLOMAX ) 0.4 MG CAPS capsule Take 1 capsule (0.4 mg total)  by mouth daily.   traMADol  (ULTRAM ) 50 MG tablet Take 1 tablet (50 mg total) by mouth 2 (two) times daily as needed for moderate pain (pain score 4-6).   [DISCONTINUED] Cholecalciferol  50 MCG (2000 UT) TABS Take 1 tablet by mouth daily.    Allergies: Patient is allergic to septra  [sulfamethoxazole -trimethoprim ], cefadroxil, ciprofloxacin, and erythromycin. Family History: Patient family history includes Brain cancer in his father; Throat cancer in his brother. Social History:  Patient  reports that he quit smoking about 65 years ago. His smoking use included cigarettes. He started smoking about 75 years ago. He has a 30 pack-year smoking history. He has never used smokeless tobacco. He reports that he does not drink alcohol and does not use drugs.  Review of Systems: Constitutional: Negative for fever malaise or anorexia Cardiovascular: negative for chest pain Respiratory: negative for SOB or persistent cough Gastrointestinal: negative for abdominal pain  Objective  Vitals: BP 135/74   Pulse 85   Temp 98.1 F (36.7 C)   Ht 5\' 5"  (1.651 m)   Wt 192 lb 12.8 oz (87.5 kg)   SpO2 93%   BMI 32.08 kg/m  General: no acute  distress , A&Ox3 HEENT: PEERL, conjunctiva normal, neck is supple Cardiovascular: Irregularly irregular with murmur Respiratory:  Good breath sounds bilaterally, CTAB with normal respiratory effort Skin:  Warm, no rashes  Commons side effects, risks, benefits, and alternatives for medications and treatment plan prescribed today were discussed, and the patient expressed understanding of the given instructions. Patient is instructed to call or message via MyChart if he/she has any questions or concerns regarding our treatment plan. No barriers to understanding were identified. We discussed Red Flag symptoms and signs in detail. Patient expressed understanding regarding what to do in case of urgent or emergency type symptoms.  Medication list was reconciled, printed and provided to the patient in AVS. Patient instructions and summary information was reviewed with the patient as documented in the AVS. This note was prepared with assistance of Dragon voice recognition software. Occasional wrong-word or sound-a-like substitutions may have occurred due to the inherent limitations of voice recognition software

## 2023-08-02 NOTE — Telephone Encounter (Unsigned)
 Copied from CRM [id number]. Topic: General - Other >> Aug 02, 2023 10:21 AM [name] wrote: Reason for CRM: Pt advise for pcp nurse to give him a call in regard to a medication [phone]

## 2023-08-02 NOTE — Telephone Encounter (Signed)
 Copied from CRM [id number]. Topic: General - Other >> Aug 02, 2023 10:21 AM Clyde Darling P wrote: Reason for CRM: Pt advise for pcp nurse to give him a call in regard to a medication [phone] >> Aug 02, 2023 10:39 AM Odilia Bennett D wrote: Patient called wanting to let the nurse know that the medication is called meloxicam 7.5mg   [phone]  Pt stated that he is taking meloxicam 7.5 and it has been added to his medication list

## 2023-08-03 ENCOUNTER — Encounter: Payer: Self-pay | Admitting: Family Medicine

## 2023-08-03 ENCOUNTER — Other Ambulatory Visit: Payer: Self-pay

## 2023-08-03 MED ORDER — MELOXICAM 7.5 MG PO TABS: 7.5000 mg | ORAL_TABLET | Freq: Every day | ORAL | 1 refills | Status: AC | PRN

## 2023-08-09 ENCOUNTER — Ambulatory Visit: Payer: Self-pay | Admitting: Podiatry

## 2023-08-10 ENCOUNTER — Encounter: Payer: Self-pay | Admitting: Podiatry

## 2023-08-10 ENCOUNTER — Ambulatory Visit (INDEPENDENT_AMBULATORY_CARE_PROVIDER_SITE_OTHER): Admitting: Podiatry

## 2023-08-10 DIAGNOSIS — E1142 Type 2 diabetes mellitus with diabetic polyneuropathy: Secondary | ICD-10-CM | POA: Diagnosis not present

## 2023-08-10 DIAGNOSIS — M79674 Pain in right toe(s): Secondary | ICD-10-CM

## 2023-08-10 DIAGNOSIS — B351 Tinea unguium: Secondary | ICD-10-CM

## 2023-08-10 NOTE — Progress Notes (Signed)
This patient returns to my office for at risk foot care.  This patient requires this care by a professional since this patient will be at risk due to having diabetic neuropathy and coagulation defect.  Patient is taking eliquiss.  Patient says toenails are painful walking and wearing his shoes.  This patient presents for at risk foot care today. ? ?General Appearance  Alert, conversant and in no acute stress. ? ?Vascular  Dorsalis pedis and posterior tibial  pulses are weakly  palpable  bilaterally.  Capillary return is within normal limits  bilaterally. Temperature is within normal limits  bilaterally. ? ?Neurologic  Senn-Weinstein monofilament wire test within normal limits  bilaterally. Muscle power within normal limits bilaterally. ? ?Nails Thick disfigured discolored nails with subungual debris  from hallux to fifth toes bilaterally. No evidence of bacterial infection or drainage bilaterally. ? ?Orthopedic  No limitations of motion  feet .  No crepitus or effusions noted.  No bony pathology or digital deformities noted. ? ?Skin  normotropic skin with no porokeratosis noted bilaterally.  No signs of infections or ulcers noted.    ? ?Onychomycosis  B/L ? ?Consent was obtained for treatment procedures.   Debridement of nails with nail nipper followed by dremel tool. Filed with dremel without incident.  ? ? ?Return office visit    10   weeks                Told patient to return for periodic foot care and evaluation due to potential at risk complications. ? ? ?Helane Gunther DPM  ?

## 2023-08-11 ENCOUNTER — Other Ambulatory Visit: Payer: Self-pay | Admitting: Pharmacist

## 2023-08-11 NOTE — Progress Notes (Signed)
 08/11/2023 Name: [name] MRN: [id number] DOB: 22-Apr-1928  Chief Complaint  Patient presents with   Medication Management    [name] is a 89 y.o. year old male who presented for an in office, face to face visit.    They were referred to the pharmacist by their PCP for assistance in managing complex medication management.    Subjective:  Care Team: Primary Care Provider: Luevenia Saha, MD ; Next Scheduled Visit: 11/03/2023  Medication Access/Adherence  Current Pharmacy:  [facility] #[id number] - [place], [place] - [address] [place] [address] Phone: [phone] Fax: [phone]   Patient reports affordability concerns with their medications: No  Patient reports access/transportation concerns to their pharmacy: No  Patient reports adherence concerns with their medications:  No  [facility]   Patient has med chart that was created 07/15/2023 by Clinical Pharmacist. He saw Dr [name] 08/02/2023 to review medications - noted that all were up to date but removed acetaminophen  from his med list.   Reviewed refill history Patient is getting the following medications from the [place] - Eliquis  (last refill was 90 day supply on 06/30/2023), atorvastatin  (last refill was for 90 day supply on 06/02/2023), Jardiance  25mg  (last refill was for 90 day supply on 08/01/2023); Lantus  (last refill was for 15mL = 50 day supply on 05/13/2023; metformin  (last refill was for 90 days on 06/14/2023)  Other medications are fill at [facility]   Objective:  Lab Results  Component Value Date   HGBA1C 7.9 (A) 05/09/2023    Lab Results  Component Value Date   CREATININE 1.12 04/04/2023   BUN 15 04/04/2023   NA 141 04/04/2023   K 3.6 04/04/2023   CL 106 04/04/2023   CO2 26 04/04/2023    Lab Results  Component Value  Date   CHOL 147 03/11/2022   HDL 52.30 03/11/2022   LDLCALC 80 03/11/2022   TRIG 75.0 03/11/2022   CHOLHDL 3 03/11/2022    Medications Reviewed Today     Reviewed by [name], RPH-CPP (Pharmacist) on 08/11/23 at 1042  Med List Status: <None>   Medication Order Taking? Sig Documenting Provider Last Dose Status Informant  apixaban  (ELIQUIS ) 5 MG TABS tablet [id number]  Take 1 tablet (5 mg total) by mouth 2 (two) times daily. [name], [name]  Active Self, Pharmacy Records  atorvastatin  (LIPITOR) 40 MG tablet [id number]  Take 1 tablet (40 mg total) by mouth at bedtime. Jeffrey Saha, MD  Active Self, Pharmacy Records  AVODART  0.5 MG capsule [id number]  Take 0.5 mg by mouth every other day. Generic is dutasteride  [provider]  Active Self, Pharmacy Records  Cholecalciferol  50 MCG (2000 UT) TABS [id number]  Take 1 tablet (2,000 Units total) by mouth daily. Jeffrey Saha, MD  Active   diltiazem  (CARDIZEM  CD) 120 MG 24 hr capsule [id number]  Take 1 capsule (120 mg total) by mouth daily. Jeffrey Saha, MD  Active   furosemide  (LASIX ) 40 MG tablet [id number]  TAKE 1 TABLET(40 MG) BY MOUTH DAILY  Patient taking differently: Take 40 mg by mouth in the morning.   [name], Jeffrey M, MD  Active Self, Pharmacy Records  gabapentin  (NEURONTIN ) 300 MG capsule [id number]  Take 2 capsules (600 mg total) by mouth at bedtime. Jeffrey Saha, MD  Active  insulin  glargine (LANTUS ) 100 UNIT/ML injection [id number] Yes Inject 30 Units into the skin every morning.  Patient taking differently: Inject 28 Units into the skin every morning.   [provider]  Active Self, Pharmacy Records  iron  polysaccharides (POLY-IRON  150) 150 MG capsule [id number]  Take 150 mg by mouth daily. [provider]  Active   meloxicam  (MOBIC ) 7.5 MG tablet [id number]  Take 1 tablet (7.5 mg total) by mouth daily as needed. Jeffrey Saha, MD  Active   metFORMIN  (GLUCOPHAGE ) 1000 MG tablet [id number]   Take 1 tablet (1,000 mg total) by mouth 2 (two) times daily with a meal. Jeffrey Saha, MD  Active   metoprolol  tartrate (LOPRESSOR ) 50 MG tablet [id number]  TAKE 1 TABLET(50 MG) BY MOUTH TWICE DAILY [name], Jeffrey M, MD  Active   pantoprazole  (PROTONIX ) 40 MG tablet [id number]  TAKE 1 TABLET BY MOUTH DAILY AT [name], Jeffrey Distel C, NP  Active   tamsulosin  (FLOMAX ) 0.4 MG CAPS capsule [id number]  Take 1 capsule (0.4 mg total) by mouth daily. Jeffrey Saha, MD  Active   traMADol  (ULTRAM ) 50 MG tablet [id number]  Take 1 tablet (50 mg total) by mouth 2 (two) times daily as needed for moderate pain (pain score 4-6). Jeffrey Saha, MD  Active            Med Note [facility] [place] [facility]   Fri Jul 15, 2023 11:20 AM) Usually takes every night before bedtime           Medication List for [name]- (DOB: Aug 17, 1928) - Updated 08/11/2023  Medication Reason for taking Morning Meal Noon Evening Meal Before Bedtime  Eliquis  5mg  (generic name is apixaban )  Prevents strokes / blood thinner 1 tablet  1 tablet   Diltiazem  120mg  (Cardizem )  Blood pressure / heart rate controller 1 capsule     Furosemide  40mg  (Lasix )  Fluid pill / swelling 1 tablet     Poly Iron  150mg  Iron  supplement /anemia 1 capsule      Insulin  glargine (Lantus  insulin )  Diabetes / blood glucose 28 units     Metformin  1000mg  (Glucophage )  Diabetes / blood glucose 1 tablet  1 tablet   Metoprolol  tartrate 50mg  (Lopressor )  Blood pressure / heart 1 tablet  1 tablet   Pantoprazole  40mg  (Protonix )  Stomach / acid reflux  1 tablet    Atorvastatin  40mg  (Lipitor)  Cholesterol / prevents heart attacks     1 tablet  Gabapentin  300mg  (Neurontin )  Nerve pain    2 capsules = 600mg    Tamsulosin  0.4mg  (Flomax ) Prostate / urination    1 capsule  Dutasteride  0.5mg  (Avodart )  Prostate / urination Take 1 capsule every OTHER day  Meloxicam  7.5mg  Pain / inflammation Take 1 tablet once a day as needed.   Tramadol  50mg  (Ultram ) Pain Take as needed up to 2 times  per day; OK to continue to just take 1 tablet at night but [name] not take more than 2 tablets per day. 1 tablet         Assessment/Plan:   Medication Management: - Reviewed chart, recent office visits and refill history.  - Patient declined updated medication chart  Follow Up Plan:as needed  [name], PharmD Clinical Pharmacist [facility]  Population Health [phone]

## 2023-08-16 ENCOUNTER — Encounter: Payer: Self-pay | Admitting: Family Medicine

## 2023-08-18 ENCOUNTER — Telehealth: Payer: Self-pay | Admitting: Cardiology

## 2023-08-18 NOTE — Telephone Encounter (Signed)
 Called and spoke to daughter, Mia Adam. She states she already spoke to someone to schedule an appointment. This is all they needed and will call us  if anything else comes up. Pt is scheduled to see Dr. Swaziland on 11/23/2023.

## 2023-08-18 NOTE — Telephone Encounter (Signed)
 Pt c/o Shortness Of Breath: STAT if SOB developed within the last 24 hours or pt is noticeably SOB on the phone  1. Are you currently SOB (can you hear that pt is SOB on the phone)? No  2. How long have you been experiencing SOB? A while now   3. Are you SOB when sitting or when up moving around? Moving around   4. Are you currently experiencing any other symptoms? No

## 2023-08-19 ENCOUNTER — Telehealth: Payer: Self-pay

## 2023-08-19 NOTE — Telephone Encounter (Signed)
 Copied from CRM [id number]. Topic: Clinical - Medication Question >> Aug 18, 2023  3:29 PM Jenice Mitts wrote: Reason for CRM: Patients daughter calling back about taking jardiance   Advised Please let pt's daughter know that he is no longer on jardiance . We stopped that when he kept getting yeast and UTI infections. It is not on his current medication list; he has the list from the pharmacist.

## 2023-08-19 NOTE — Telephone Encounter (Signed)
 Noted

## 2023-08-29 ENCOUNTER — Encounter: Payer: Self-pay | Admitting: Family Medicine

## 2023-08-30 ENCOUNTER — Other Ambulatory Visit: Payer: Self-pay

## 2023-08-30 MED ORDER — DUTASTERIDE 0.5 MG PO CAPS: 0.5000 mg | ORAL_CAPSULE | Freq: Every day | ORAL | 3 refills | Status: AC

## 2023-08-30 MED ORDER — MECLIZINE HCL 25 MG PO TABS: 12.5000 mg | ORAL_TABLET | Freq: Two times a day (BID) | ORAL | 5 refills | Status: DC | PRN

## 2023-09-12 ENCOUNTER — Other Ambulatory Visit: Payer: Self-pay | Admitting: Cardiology

## 2023-10-22 ENCOUNTER — Encounter (HOSPITAL_COMMUNITY): Payer: Self-pay

## 2023-10-22 ENCOUNTER — Other Ambulatory Visit: Payer: Self-pay

## 2023-10-22 ENCOUNTER — Emergency Department (HOSPITAL_COMMUNITY)

## 2023-10-22 ENCOUNTER — Inpatient Hospital Stay (HOSPITAL_COMMUNITY)
Admission: EM | Admit: 2023-10-22 | Discharge: 2023-10-24 | DRG: 871 | Disposition: A | Discharge status: Home / Self Care | Attending: Internal Medicine | Admitting: Internal Medicine

## 2023-10-22 DIAGNOSIS — N400 Enlarged prostate without lower urinary tract symptoms: Secondary | ICD-10-CM | POA: Diagnosis present

## 2023-10-22 DIAGNOSIS — J984 Other disorders of lung: Secondary | ICD-10-CM | POA: Diagnosis not present

## 2023-10-22 DIAGNOSIS — I350 Nonrheumatic aortic (valve) stenosis: Secondary | ICD-10-CM | POA: Diagnosis present

## 2023-10-22 DIAGNOSIS — I493 Ventricular premature depolarization: Secondary | ICD-10-CM | POA: Diagnosis present

## 2023-10-22 DIAGNOSIS — R509 Fever, unspecified: Secondary | ICD-10-CM | POA: Diagnosis not present

## 2023-10-22 DIAGNOSIS — A419 Sepsis, unspecified organism: Secondary | ICD-10-CM | POA: Diagnosis not present

## 2023-10-22 DIAGNOSIS — I499 Cardiac arrhythmia, unspecified: Secondary | ICD-10-CM | POA: Diagnosis not present

## 2023-10-22 DIAGNOSIS — R54 Age-related physical debility: Secondary | ICD-10-CM | POA: Diagnosis present

## 2023-10-22 DIAGNOSIS — I130 Hypertensive heart and chronic kidney disease with heart failure and stage 1 through stage 4 chronic kidney disease, or unspecified chronic kidney disease: Secondary | ICD-10-CM | POA: Diagnosis present

## 2023-10-22 DIAGNOSIS — E872 Acidosis, unspecified: Secondary | ICD-10-CM | POA: Diagnosis present

## 2023-10-22 DIAGNOSIS — Z881 Allergy status to other antibiotic agents status: Secondary | ICD-10-CM

## 2023-10-22 DIAGNOSIS — E876 Hypokalemia: Secondary | ICD-10-CM | POA: Diagnosis present

## 2023-10-22 DIAGNOSIS — J189 Pneumonia, unspecified organism: Principal | ICD-10-CM | POA: Diagnosis present

## 2023-10-22 DIAGNOSIS — Z7901 Long term (current) use of anticoagulants: Secondary | ICD-10-CM

## 2023-10-22 DIAGNOSIS — Z794 Long term (current) use of insulin: Secondary | ICD-10-CM

## 2023-10-22 DIAGNOSIS — R0602 Shortness of breath: Secondary | ICD-10-CM | POA: Diagnosis not present

## 2023-10-22 DIAGNOSIS — E785 Hyperlipidemia, unspecified: Secondary | ICD-10-CM | POA: Diagnosis present

## 2023-10-22 DIAGNOSIS — I4891 Unspecified atrial fibrillation: Secondary | ICD-10-CM | POA: Diagnosis not present

## 2023-10-22 DIAGNOSIS — M19042 Primary osteoarthritis, left hand: Secondary | ICD-10-CM | POA: Diagnosis present

## 2023-10-22 DIAGNOSIS — K219 Gastro-esophageal reflux disease without esophagitis: Secondary | ICD-10-CM | POA: Diagnosis present

## 2023-10-22 DIAGNOSIS — E1122 Type 2 diabetes mellitus with diabetic chronic kidney disease: Secondary | ICD-10-CM | POA: Diagnosis present

## 2023-10-22 DIAGNOSIS — I5032 Chronic diastolic (congestive) heart failure: Secondary | ICD-10-CM | POA: Diagnosis present

## 2023-10-22 DIAGNOSIS — N1831 Chronic kidney disease, stage 3a: Secondary | ICD-10-CM | POA: Diagnosis present

## 2023-10-22 DIAGNOSIS — Z951 Presence of aortocoronary bypass graft: Secondary | ICD-10-CM

## 2023-10-22 DIAGNOSIS — Z961 Presence of intraocular lens: Secondary | ICD-10-CM | POA: Diagnosis present

## 2023-10-22 DIAGNOSIS — R5383 Other fatigue: Secondary | ICD-10-CM | POA: Diagnosis not present

## 2023-10-22 DIAGNOSIS — Z9841 Cataract extraction status, right eye: Secondary | ICD-10-CM

## 2023-10-22 DIAGNOSIS — Z96641 Presence of right artificial hip joint: Secondary | ICD-10-CM | POA: Diagnosis present

## 2023-10-22 DIAGNOSIS — R531 Weakness: Secondary | ICD-10-CM

## 2023-10-22 DIAGNOSIS — E1142 Type 2 diabetes mellitus with diabetic polyneuropathy: Secondary | ICD-10-CM

## 2023-10-22 DIAGNOSIS — Z87891 Personal history of nicotine dependence: Secondary | ICD-10-CM

## 2023-10-22 DIAGNOSIS — Z87442 Personal history of urinary calculi: Secondary | ICD-10-CM

## 2023-10-22 DIAGNOSIS — Z9842 Cataract extraction status, left eye: Secondary | ICD-10-CM

## 2023-10-22 DIAGNOSIS — Z7984 Long term (current) use of oral hypoglycemic drugs: Secondary | ICD-10-CM

## 2023-10-22 DIAGNOSIS — Z882 Allergy status to sulfonamides status: Secondary | ICD-10-CM

## 2023-10-22 DIAGNOSIS — I4821 Permanent atrial fibrillation: Secondary | ICD-10-CM | POA: Diagnosis not present

## 2023-10-22 DIAGNOSIS — J129 Viral pneumonia, unspecified: Secondary | ICD-10-CM | POA: Diagnosis present

## 2023-10-22 DIAGNOSIS — R918 Other nonspecific abnormal finding of lung field: Secondary | ICD-10-CM | POA: Diagnosis not present

## 2023-10-22 DIAGNOSIS — Z1152 Encounter for screening for COVID-19: Secondary | ICD-10-CM

## 2023-10-22 DIAGNOSIS — Z955 Presence of coronary angioplasty implant and graft: Secondary | ICD-10-CM

## 2023-10-22 DIAGNOSIS — M19041 Primary osteoarthritis, right hand: Secondary | ICD-10-CM | POA: Diagnosis present

## 2023-10-22 DIAGNOSIS — R6510 Systemic inflammatory response syndrome (SIRS) of non-infectious origin without acute organ dysfunction: Secondary | ICD-10-CM

## 2023-10-22 DIAGNOSIS — Z8673 Personal history of transient ischemic attack (TIA), and cerebral infarction without residual deficits: Secondary | ICD-10-CM

## 2023-10-22 DIAGNOSIS — M1909 Primary osteoarthritis, other specified site: Secondary | ICD-10-CM | POA: Diagnosis present

## 2023-10-22 DIAGNOSIS — Z8701 Personal history of pneumonia (recurrent): Secondary | ICD-10-CM

## 2023-10-22 DIAGNOSIS — Z79899 Other long term (current) drug therapy: Secondary | ICD-10-CM

## 2023-10-22 DIAGNOSIS — Z87440 Personal history of urinary (tract) infections: Secondary | ICD-10-CM

## 2023-10-22 DIAGNOSIS — I2510 Atherosclerotic heart disease of native coronary artery without angina pectoris: Secondary | ICD-10-CM | POA: Diagnosis present

## 2023-10-22 LAB — I-STAT CG4 LACTIC ACID, ED
Lactic Acid, Venous: 2.1 mmol/L (ref 0.5–1.9)
Lactic Acid, Venous: 1.9 mmol/L (ref 0.5–1.9)

## 2023-10-22 LAB — CBC WITH DIFFERENTIAL/PLATELET
Abs Immature Granulocytes: 0.05 K/uL (ref 0.00–0.07)
Basophils Absolute: 0.1 K/uL (ref 0.0–0.1)
Basophils Relative: 1 %
Eosinophils Absolute: 0.1 K/uL (ref 0.0–0.5)
Eosinophils Relative: 1 %
HCT: 29.2 % — ABNORMAL LOW (ref 39.0–52.0)
Hemoglobin: 8.8 g/dL — ABNORMAL LOW (ref 13.0–17.0)
Immature Granulocytes: 1 %
Lymphocytes Relative: 11 %
Lymphs Abs: 1.1 K/uL (ref 0.7–4.0)
MCH: 24.2 pg — ABNORMAL LOW (ref 26.0–34.0)
MCHC: 30.1 g/dL (ref 30.0–36.0)
MCV: 80.4 fL (ref 80.0–100.0)
Monocytes Absolute: 1.1 K/uL — ABNORMAL HIGH (ref 0.1–1.0)
Monocytes Relative: 12 %
Neutro Abs: 7.4 K/uL (ref 1.7–7.7)
Neutrophils Relative %: 74 %
Platelets: 212 K/uL (ref 150–400)
RBC: 3.63 MIL/uL — ABNORMAL LOW (ref 4.22–5.81)
RDW: 18.2 % — ABNORMAL HIGH (ref 11.5–15.5)
WBC: 9.8 K/uL (ref 4.0–10.5)
nRBC: 0 % (ref 0.0–0.2)

## 2023-10-22 LAB — GLUCOSE, CAPILLARY: Glucose-Capillary: 141 mg/dL — ABNORMAL HIGH (ref 70–99)

## 2023-10-22 LAB — TROPONIN I (HIGH SENSITIVITY)
Troponin I (High Sensitivity): 25 ng/L — ABNORMAL HIGH (ref <18)
Troponin I (High Sensitivity): 29 ng/L — ABNORMAL HIGH (ref <18)

## 2023-10-22 LAB — COMPREHENSIVE METABOLIC PANEL WITH GFR
ALT: 11 U/L (ref 0–44)
AST: 16 U/L (ref 15–41)
Albumin: 3.7 g/dL (ref 3.5–5.0)
Alkaline Phosphatase: 49 U/L (ref 38–126)
Anion gap: 12 (ref 5–15)
BUN: 19 mg/dL (ref 8–23)
CO2: 26 mmol/L (ref 22–32)
Calcium: 8.6 mg/dL — ABNORMAL LOW (ref 8.9–10.3)
Chloride: 96 mmol/L — ABNORMAL LOW (ref 98–111)
Creatinine, Ser: 1.29 mg/dL — ABNORMAL HIGH (ref 0.61–1.24)
GFR, Estimated: 52 mL/min — ABNORMAL LOW (ref >60)
Glucose, Bld: 229 mg/dL — ABNORMAL HIGH (ref 70–99)
Potassium: 3.4 mmol/L — ABNORMAL LOW (ref 3.5–5.1)
Sodium: 134 mmol/L — ABNORMAL LOW (ref 135–145)
Total Bilirubin: 1.1 mg/dL (ref 0.0–1.2)
Total Protein: 6.8 g/dL (ref 6.5–8.1)

## 2023-10-22 LAB — BRAIN NATRIURETIC PEPTIDE: B Natriuretic Peptide: 426.5 pg/mL — ABNORMAL HIGH (ref 0.0–100.0)

## 2023-10-22 LAB — SARS CORONAVIRUS 2 BY RT PCR: SARS Coronavirus 2 by RT PCR: NEGATIVE

## 2023-10-22 MED ORDER — SODIUM CHLORIDE 0.9 % IV SOLN
1.0000 g | INTRAVENOUS | Status: DC
  Administered 2023-10-23: 1 g via INTRAVENOUS
  Filled 2023-10-22: qty 10

## 2023-10-22 MED ORDER — SODIUM CHLORIDE 0.9 % IV SOLN
1.0000 g | Freq: Once | INTRAVENOUS | Status: AC
  Administered 2023-10-22: 1 g via INTRAVENOUS
  Filled 2023-10-22: qty 10

## 2023-10-22 MED ORDER — DUTASTERIDE 0.5 MG PO CAPS
0.5000 mg | ORAL_CAPSULE | Freq: Every day | ORAL | Status: DC
Start: 2023-10-23 — End: 2023-10-24
  Administered 2023-10-23 – 2023-10-24 (×2): 0.5 mg via ORAL
  Filled 2023-10-22 (×2): qty 1

## 2023-10-22 MED ORDER — FUROSEMIDE 40 MG PO TABS
40.0000 mg | ORAL_TABLET | Freq: Every day | ORAL | Status: DC
  Administered 2023-10-23 – 2023-10-24 (×2): 40 mg via ORAL
  Filled 2023-10-22 (×2): qty 1

## 2023-10-22 MED ORDER — AZITHROMYCIN 500 MG PO TABS
500.0000 mg | ORAL_TABLET | Freq: Every day | ORAL | Status: DC
  Administered 2023-10-23: 500 mg via ORAL
  Filled 2023-10-22: qty 1

## 2023-10-22 MED ORDER — POTASSIUM CHLORIDE CRYS ER 20 MEQ PO TBCR
40.0000 meq | EXTENDED_RELEASE_TABLET | Freq: Once | ORAL | Status: AC
  Administered 2023-10-22: 40 meq via ORAL
  Filled 2023-10-22: qty 2

## 2023-10-22 MED ORDER — PANTOPRAZOLE SODIUM 40 MG PO TBEC
40.0000 mg | DELAYED_RELEASE_TABLET | Freq: Every day | ORAL | Status: DC
Start: 2023-10-23 — End: 2023-10-24
  Administered 2023-10-23 – 2023-10-24 (×2): 40 mg via ORAL
  Filled 2023-10-22 (×2): qty 1

## 2023-10-22 MED ORDER — AZITHROMYCIN 250 MG PO TABS: 500.0000 mg | ORAL_TABLET | Freq: Once | ORAL | Status: DC

## 2023-10-22 MED ORDER — BISACODYL 5 MG PO TBEC: 5.0000 mg | DELAYED_RELEASE_TABLET | Freq: Every day | ORAL | Status: DC | PRN

## 2023-10-22 MED ORDER — ACETAMINOPHEN 325 MG PO TABS
650.0000 mg | ORAL_TABLET | Freq: Once | ORAL | Status: AC
  Administered 2023-10-22: 650 mg via ORAL
  Filled 2023-10-22: qty 2

## 2023-10-22 MED ORDER — DILTIAZEM HCL ER COATED BEADS 120 MG PO CP24
120.0000 mg | ORAL_CAPSULE | Freq: Every day | ORAL | Status: DC
  Administered 2023-10-23 – 2023-10-24 (×2): 120 mg via ORAL
  Filled 2023-10-22 (×2): qty 1

## 2023-10-22 MED ORDER — SENNOSIDES-DOCUSATE SODIUM 8.6-50 MG PO TABS: 1.0000 | ORAL_TABLET | Freq: Every evening | ORAL | Status: DC | PRN

## 2023-10-22 MED ORDER — TAMSULOSIN HCL 0.4 MG PO CAPS
0.4000 mg | ORAL_CAPSULE | Freq: Every day | ORAL | Status: DC
  Administered 2023-10-23 – 2023-10-24 (×2): 0.4 mg via ORAL
  Filled 2023-10-22 (×2): qty 1

## 2023-10-22 MED ORDER — VITAMIN D 25 MCG (1000 UNIT) PO TABS
1000.0000 [IU] | ORAL_TABLET | Freq: Every day | ORAL | Status: DC
  Administered 2023-10-23 – 2023-10-24 (×2): 1000 [IU] via ORAL
  Filled 2023-10-22 (×2): qty 1

## 2023-10-22 MED ORDER — METOPROLOL SUCCINATE ER 50 MG PO TB24
50.0000 mg | ORAL_TABLET | Freq: Two times a day (BID) | ORAL | Status: DC
  Administered 2023-10-22 – 2023-10-24 (×4): 50 mg via ORAL
  Filled 2023-10-22 (×4): qty 1

## 2023-10-22 MED ORDER — ATORVASTATIN CALCIUM 40 MG PO TABS
40.0000 mg | ORAL_TABLET | Freq: Every day | ORAL | Status: DC
  Administered 2023-10-22 – 2023-10-23 (×2): 40 mg via ORAL
  Filled 2023-10-22 (×2): qty 1

## 2023-10-22 MED ORDER — ONDANSETRON HCL 4 MG PO TABS: 4.0000 mg | ORAL_TABLET | Freq: Four times a day (QID) | ORAL | Status: DC | PRN

## 2023-10-22 MED ORDER — INSULIN GLARGINE 100 UNIT/ML ~~LOC~~ SOLN
28.0000 [IU] | Freq: Every day | SUBCUTANEOUS | Status: DC
  Administered 2023-10-23 – 2023-10-24 (×2): 28 [IU] via SUBCUTANEOUS
  Filled 2023-10-22 (×2): qty 0.28

## 2023-10-22 MED ORDER — ACETAMINOPHEN 650 MG RE SUPP: 650.0000 mg | Freq: Four times a day (QID) | RECTAL | Status: DC | PRN

## 2023-10-22 MED ORDER — MECLIZINE HCL 25 MG PO TABS: 12.5000 mg | ORAL_TABLET | Freq: Two times a day (BID) | ORAL | Status: DC | PRN

## 2023-10-22 MED ORDER — SODIUM CHLORIDE 0.9 % IV SOLN
500.0000 mg | Freq: Once | INTRAVENOUS | Status: AC
  Administered 2023-10-22: 500 mg via INTRAVENOUS
  Filled 2023-10-22: qty 5

## 2023-10-22 MED ORDER — APIXABAN 5 MG PO TABS
5.0000 mg | ORAL_TABLET | Freq: Two times a day (BID) | ORAL | Status: DC
  Administered 2023-10-22 – 2023-10-24 (×4): 5 mg via ORAL
  Filled 2023-10-22 (×4): qty 1

## 2023-10-22 MED ORDER — INSULIN ASPART 100 UNIT/ML IJ SOLN
0.0000 [IU] | Freq: Every day | INTRAMUSCULAR | Status: DC
  Filled 2023-10-22: qty 0.05

## 2023-10-22 MED ORDER — INSULIN ASPART 100 UNIT/ML IJ SOLN
0.0000 [IU] | Freq: Three times a day (TID) | INTRAMUSCULAR | Status: DC
  Administered 2023-10-23: 3 [IU] via SUBCUTANEOUS
  Administered 2023-10-23: 2 [IU] via SUBCUTANEOUS
  Filled 2023-10-22: qty 0.15

## 2023-10-22 MED ORDER — ONDANSETRON HCL 4 MG/2ML IJ SOLN: 4.0000 mg | Freq: Four times a day (QID) | INTRAMUSCULAR | Status: DC | PRN

## 2023-10-22 MED ORDER — POLYSACCHARIDE IRON COMPLEX 150 MG PO CAPS
150.0000 mg | ORAL_CAPSULE | Freq: Every day | ORAL | Status: DC
  Administered 2023-10-23 – 2023-10-24 (×2): 150 mg via ORAL
  Filled 2023-10-22 (×2): qty 1

## 2023-10-22 MED ORDER — ACETAMINOPHEN 325 MG PO TABS: 650.0000 mg | ORAL_TABLET | Freq: Four times a day (QID) | ORAL | Status: DC | PRN

## 2023-10-22 MED ORDER — SODIUM CHLORIDE 0.9 % IV BOLUS
500.0000 mL | Freq: Once | INTRAVENOUS | Status: AC
  Administered 2023-10-22: 500 mL via INTRAVENOUS

## 2023-10-22 MED ORDER — GABAPENTIN 300 MG PO CAPS
600.0000 mg | ORAL_CAPSULE | Freq: Every day | ORAL | Status: DC
  Administered 2023-10-23: 600 mg via ORAL
  Filled 2023-10-22: qty 2

## 2023-10-22 NOTE — H&P (Signed)
 History and Physical  [name] FMW:[id number] DOB: 05-Jun-1928 DOA: 10/22/2023  PCP: Jodie Lavern CROME, MD   Chief Complaint: Fever, fatigue, SOB  HPI: [name] is a 89 y.o. male with medical history significant for A-fib on Eliquis , CAD s/p PCI and CABG x2, HTN, HLD, HFpEF, GERD, BPH, CKD 3A, BPH and MCI who presented to the ED for evaluation of fatigue, muscle aches and SOB. [place] and daughter and felt slightly dizzy 1 day. He returned home yesterday and has had subjective fevers and generalized weakness. He has had mild SOB but no cough, chest pain, abdominal pain, nausea, vomiting, dysuria or headache.  ED Course: Initial vitals show temp to 102, RR 25, HR 117, BP 128/59, SpO2 94% on room air. Initial labs significant for sodium 134, K+ 3.4, Leukos 229, creatinine 1.29, WBC 9.8, Hgb 8.8, BNP 427, troponin 25, lactic acid 2.1-1.9,. EKG shows A-fib with PVCs. CXR shows patchy airspace disease at the right lung base. Pt received Tylenol  650 mg x 1, IV NS 500 cc bolus, IV Rocephin  and IV azithromycin . TRH was consulted for admission.   Review of Systems: Please see HPI for pertinent positives and negatives. A complete 10 system review of systems are otherwise negative.  Past Medical History:  Diagnosis Date   Abdominal pain    Acute renal failure (HCC)     resolved   Aortic stenosis    Arthritis    hands & legs (11/'10/2014)   Ataxia    Bladder outlet obstruction    BPH (benign prostatic hyperplasia)    CAD (coronary artery disease)    a. CABG IN 1989. b. 01/08/2015 CTO of ost LAD, LIMA to LAD not visualized but assumed patent given myoview  finding, occluded SVG to diagonal, 99% mid RCA tx w/ SYNERGY DES 3X28 mm   Chronic heart failure with preserved ejection fraction (HFpEF) (HCC)    Constipation    Diabetes mellitus type 2, insulin  dependent (HCC)    Epistaxis, recurrent 04/2015   GERD (gastroesophageal  reflux disease)    HTN (hypertension)    Hx of bacterial pneumonia    Hyperlipemia    Kidney stones    Mild cognitive impairment 03/05/2021   MMSE 26/30 and 6CIT 9 03/2021 AWV   Permanent atrial fibrillation (HCC)    Pneumonia 04/2019   Rib fractures    left rib fractures being treated with pain medications   Thoracic aortic aneurysm (HCC)    Urinary tract infection     Enterococcus   Past Surgical History:  Procedure Laterality Date   CARDIAC CATHETERIZATION  1989   CARDIAC CATHETERIZATION N/A 01/08/2015   Procedure: Left Heart Cath and Cors/Grafts Angiography;  Surgeon: Peter M [place], MD;  Location: [facility] INVASIVE CV LAB;  Service: Cardiovascular;  Laterality: N/A;   CARDIAC CATHETERIZATION  01/08/2015   Procedure: Coronary Stent Intervention;  Surgeon: Peter M [place], MD;  Location: [facility] INVASIVE CV LAB;  Service: Cardiovascular;;   CARPAL TUNNEL RELEASE Right 08/2010   CATARACT EXTRACTION W/ INTRAOCULAR LENS  IMPLANT, BILATERAL Bilateral    COLONOSCOPY WITH PROPOFOL  N/A 05/05/2022   Procedure: COLONOSCOPY WITH PROPOFOL ;  Surgeon: Abran Norleen SAILOR, MD;  Location: [facility] ENDOSCOPY;  Service: Gastroenterology;  Laterality: N/A;   CORONARY ANGIOPLASTY  01/08/15   RCA DES   CORONARY ARTERY BYPASS GRAFT  1989   CABG X 2   ESOPHAGOGASTRODUODENOSCOPY (EGD) WITH PROPOFOL  N/A 05/05/2022   Procedure: ESOPHAGOGASTRODUODENOSCOPY (EGD)  WITH PROPOFOL ;  Surgeon: Abran Norleen SAILOR, MD;  Location: [facility];  Service: Gastroenterology;  Laterality: N/A;   IR THORACENTESIS ASP PLEURAL SPACE W/IMG GUIDE  05/14/2019   JOINT REPLACEMENT     POLYPECTOMY  05/05/2022   Procedure: POLYPECTOMY;  Surgeon: Abran Norleen SAILOR, MD;  Location: [facility];  Service: Gastroenterology;;   TONSILLECTOMY     TOTAL HIP ARTHROPLASTY Right 2000   Social History:  reports that he quit smoking about 65 years ago. His smoking use included cigarettes. He started smoking about 75 years ago. He has a 30 pack-year smoking history. He has never  used smokeless tobacco. He reports that he does not drink alcohol and does not use drugs.  Allergies  Allergen Reactions   Septra  [Sulfamethoxazole -Trimethoprim ] Itching   Cefadroxil Other (See Comments)    Unknown reaction    Ciprofloxacin Other (See Comments)    Unknown action     Erythromycin Other (See Comments)    Upsets the stomach     Family History  Problem Relation Age of Onset   Brain cancer Father    Throat cancer Brother    Liver disease Neg Hx    Colon cancer Neg Hx    Esophageal cancer Neg Hx      Prior to Admission medications   Medication Sig Start Date End Date Taking? Authorizing Provider  apixaban  (ELIQUIS ) 5 MG TABS tablet Take 1 tablet (5 mg total) by mouth 2 (two) times daily. 05/09/22   Sherrill Cable Latif, DO  atorvastatin  (LIPITOR) 40 MG tablet Take 1 tablet (40 mg total) by mouth at bedtime. 08/16/22   Jodie Lavern CROME, MD  AVODART  0.5 MG capsule Take 0.5 mg by mouth every other day. Generic is dutasteride  07/20/11   [provider]  Cholecalciferol  50 MCG (2000 UT) TABS Take 1 tablet (2,000 Units total) by mouth daily. 08/02/23   Jodie Lavern CROME, MD  diltiazem  (CARDIZEM  CD) 120 MG 24 hr capsule Take 1 capsule (120 mg total) by mouth daily. 06/06/23   Jodie Lavern CROME, MD  dutasteride  (AVODART ) 0.5 MG capsule Take 1 capsule (0.5 mg total) by mouth daily. 08/30/23   Jodie Lavern CROME, MD  furosemide  (LASIX ) 40 MG tablet TAKE 1 TABLET(40 MG) BY MOUTH DAILY 09/14/23   [place], Peter M, MD  gabapentin  (NEURONTIN ) 300 MG capsule Take 2 capsules (600 mg total) by mouth at bedtime. 04/11/23   Jodie Lavern CROME, MD  insulin  glargine (LANTUS ) 100 UNIT/ML injection Inject 30 Units into the skin every morning. Patient taking differently: Inject 28 Units into the skin every morning.    [provider]  iron  polysaccharides (POLY-IRON  150) 150 MG capsule Take 150 mg by mouth daily.    [provider]  meclizine  (ANTIVERT ) 25 MG tablet Take 0.5-1 tablets  (12.5-25 mg total) by mouth 2 (two) times daily as needed for dizziness. 08/30/23   Jodie Lavern CROME, MD  meloxicam  (MOBIC ) 7.5 MG tablet Take 1 tablet (7.5 mg total) by mouth daily as needed. 08/03/23   Jodie Lavern CROME, MD  metFORMIN  (GLUCOPHAGE ) 1000 MG tablet Take 1 tablet (1,000 mg total) by mouth 2 (two) times daily with a meal. 04/29/23   Jodie Lavern CROME, MD  metoprolol  tartrate (LOPRESSOR ) 50 MG tablet TAKE 1 TABLET(50 MG) BY MOUTH TWICE DAILY 07/20/23   [place], Peter M, MD  pantoprazole  (PROTONIX ) 40 MG tablet TAKE 1 TABLET BY MOUTH DAILY AT NOON 07/04/23   Daneen Damien BROCKS, NP  tamsulosin  (FLOMAX ) 0.4 MG CAPS  capsule Take 1 capsule (0.4 mg total) by mouth daily. 04/29/23   Jodie Lavern CROME, MD  traMADol  (ULTRAM ) 50 MG tablet Take 1 tablet (50 mg total) by mouth 2 (two) times daily as needed for moderate pain (pain score 4-6). 07/01/23   Jodie Lavern CROME, MD    Physical Exam: BP 115/63   Pulse 72   Temp 98 F (36.7 C) (Oral)   Resp 19   Ht 5' 5 (1.651 m)   Wt 86.2 kg   SpO2 94%   BMI 31.62 kg/m  General: Pleasant, well-appearing elderly man laying in bed. No acute distress. HEENT: [place]/AT. Anicteric sclera CV: Regular rate. Irregularly irregular rhythm. No murmurs, rubs, or gallops. No LE edema Pulmonary: Lungs CTAB. Normal effort. No wheezing or rales. Abdominal: Soft, nontender, nondistended. Normal bowel sounds. Extremities: Palpable radial and DP pulses. Normal ROM. Skin: Warm and dry. No obvious rash or lesions. Neuro: A&Ox3. Moves all extremities. Normal sensation to light touch. No focal deficit. Psych: Normal mood and affect          Labs on Admission:  Basic Metabolic Panel: Recent Labs  Lab 10/22/23 1846  NA 134*  K 3.4*  CL 96*  CO2 26  GLUCOSE 229*  BUN 19  CREATININE 1.29*  CALCIUM  8.6*   Liver Function Tests: Recent Labs  Lab 10/22/23 1846  AST 16  ALT 11  ALKPHOS 49  BILITOT 1.1  PROT 6.8  ALBUMIN 3.7   No results for input(s): LIPASE, AMYLASE in the  last 168 hours. No results for input(s): AMMONIA in the last 168 hours. CBC: Recent Labs  Lab 10/22/23 1846  WBC 9.8  NEUTROABS 7.4  HGB 8.8*  HCT 29.2*  MCV 80.4  PLT 212   Cardiac Enzymes: No results for input(s): CKTOTAL, CKMB, CKMBINDEX, TROPONINI in the last 168 hours. BNP (last 3 results) Recent Labs    01/10/23 1244 04/04/23 0642 10/22/23 1846  BNP 472.9* 255.1* 426.5*    ProBNP (last 3 results) No results for input(s): PROBNP in the last 8760 hours.  CBG: No results for input(s): GLUCAP in the last 168 hours.  Radiological Exams on Admission: DG Chest Port 1 View Result Date: 10/22/2023 CLINICAL DATA:  Fever, body aches, and fatigue. EXAM: PORTABLE CHEST 1 VIEW COMPARISON:  04/04/2023. FINDINGS: The heart is enlarged the mediastinal contour stable. There is atherosclerotic calcification of the aorta. Patchy airspace disease is noted at the right lung base. No effusion or pneumothorax is seen. Sternotomy wires are present over the midline. No acute osseous abnormality. IMPRESSION: Patchy airspace disease at the right lung base, suspicious for pneumonia. Electronically Signed   By: Leita Birmingham M.D.   On: 10/22/2023 18:43   Assessment/Plan [name] is a 89 y.o. male with medical history significant for A-fib on Eliquis , CAD s/p PCI and CABG x2, HTN, HLD, HFpEF, GERD, BPH, CKD 3A, BPH and MCI who presented to the ED for evaluation of fatigue, muscle aches and SOB and admitted for sepsis secondary to pneumonia.  # Sepsis # Community-acquired pneumonia - Pt presented with fevers, mild SOB and generalized weakness - Chest imaging shows patchy airspace disease by the right lung base - Met sepsis criteria with fever, tachycardia, tachypnea, lactic acidosis and evidence of respiratory infection - Continue IV Rocephin  and azithromycin  - Follow-up blood culture and sputum culture - Check MRSA screen, full RVP, procalcitonin, urinary Legionella and  strep pneumo - Trend CBC, fever curve - Incentive spirometer, flutter valve - Supplemental O2  as needed  # T2DM - Last A1c 7.9% 5 months ago - Blood glucose of 229 on admission - Home regimen includes metformin  and Lantus  28 units daily - Continue Lantus  28 units daily - Hold metformin , resume at discharge - SSI with meals, CBG monitoring - Carb modified diet - F/u repeat A1c  # Permanent A-fib - EKG on admission shows A-fib with PVCs - Initially tachycardic but HR now stable in the 70s to 90s - Continue diltiazem , Toprol -XL and Eliquis  - Telemetry  # Hypokalemia - K+ slightly low at 3.4, replete with KCl 40 mEq x 1 - Follow-up morning mag and potassium  # HFpEF # HTN - BP stable with SBP in the 100s to 120s - Last TTE on 05/02/2023 showed EF 50-to 55%, severely dilated LA, and moderate to severe aortic valve stenosis - Patient euvolemic on exam - Continue Toprol  XL and Lasix   # CAD s/p PCI and CABG x2 - Patient with no chest pain - Continue atorvastatin  and Eliquis   # HLD - Continue atorvastatin   # BPH - Continue tamsulosin  and dutasteride   # GERD - Continue Protonix   # Generalized weakness - In the setting of acute illness - PT/OT eval and treat  DVT prophylaxis: Eliquis     Code Status: Full Code  Consults called: None  Family Communication: No family at bedside  Severity of Illness: The appropriate patient status for this patient is INPATIENT. Inpatient status is judged to be reasonable and necessary in order to provide the required intensity of service to ensure the patient's safety. The patient's presenting symptoms, physical exam findings, and initial radiographic and laboratory data in the context of their chronic comorbidities is felt to place them at high risk for further clinical deterioration. Furthermore, it is not anticipated that the patient will be medically stable for discharge from the hospital within 2 midnights of admission.   * I certify  that at the point of admission it is my clinical judgment that the patient will require inpatient hospital care spanning beyond 2 midnights from the point of admission due to high intensity of service, high risk for further deterioration and high frequency of surveillance required.*  Level of care: Telemetry   This record has been created using Conservation officer, historic buildings. Errors have been sought and corrected, but may not always be located. Such creation errors do not reflect on the standard of care.   Lou Claretta HERO, MD 10/22/2023, 8:54 PM [facility] Pager: [phone] Isaiah 41:10   If 7PM-7AM, please contact night-coverage [email or web address] Password [email or web address]

## 2023-10-22 NOTE — ED Provider Notes (Signed)
 [place] EMERGENCY DEPARTMENT AT [facility] Provider Note   CSN: [id number] Arrival date & time: 10/22/23  1756     Patient presents with: Generalized Body Aches   [name] is a 89 y.o. male.  He has a history of coronary disease, CKD, stroke, hypertension, afib/aortic stenosis and is on Eliquis  and diuretics, insulin .  [place]  Had an episode of dizziness there.  Returned home yesterday and today had a fever and felt weak.  Feels a little short of breath.  No headache or cough no abdominal pain vomiting diarrhea or urinary symptoms.  {Add pertinent medical, surgical, social history, OB history to YEP:[id number]} The history is provided by the patient.  Weakness Severity:  Moderate Onset quality:  Gradual Duration:  1 day Timing:  Constant Progression:  Unchanged Chronicity:  New Relieved by:  None tried Worsened by:  Activity Ineffective treatments:  None tried Associated symptoms: dizziness, fever and shortness of breath   Associated symptoms: no abdominal pain, no chest pain, no cough, no diarrhea, no dysuria, no headaches, no nausea and no vomiting        Prior to Admission medications   Medication Sig Start Date End Date Taking? Authorizing Provider  apixaban  (ELIQUIS ) 5 MG TABS tablet Take 1 tablet (5 mg total) by mouth 2 (two) times daily. 05/09/22   Sheikh, Omair Latif, DO  atorvastatin  (LIPITOR) 40 MG tablet Take 1 tablet (40 mg total) by mouth at bedtime. 08/16/22   Jodie Lavern CROME, MD  AVODART  0.5 MG capsule Take 0.5 mg by mouth every other day. Generic is dutasteride  07/20/11   [provider]  Cholecalciferol  50 MCG (2000 UT) TABS Take 1 tablet (2,000 Units total) by mouth daily. 08/02/23   Jodie Lavern CROME, MD  diltiazem  (CARDIZEM  CD) 120 MG 24 hr capsule Take 1 capsule (120 mg total) by mouth daily. 06/06/23   Jodie Lavern CROME, MD  dutasteride  (AVODART ) 0.5 MG capsule Take 1 capsule (0.5 mg total) by mouth daily.  08/30/23   Jodie Lavern CROME, MD  furosemide  (LASIX ) 40 MG tablet TAKE 1 TABLET(40 MG) BY MOUTH DAILY 09/14/23   [place], Peter M, MD  gabapentin  (NEURONTIN ) 300 MG capsule Take 2 capsules (600 mg total) by mouth at bedtime. 04/11/23   Jodie Lavern CROME, MD  insulin  glargine (LANTUS ) 100 UNIT/ML injection Inject 30 Units into the skin every morning. Patient taking differently: Inject 28 Units into the skin every morning.    [provider]  iron  polysaccharides (POLY-IRON  150) 150 MG capsule Take 150 mg by mouth daily.    [provider]  meclizine  (ANTIVERT ) 25 MG tablet Take 0.5-1 tablets (12.5-25 mg total) by mouth 2 (two) times daily as needed for dizziness. 08/30/23   Jodie Lavern CROME, MD  meloxicam  (MOBIC ) 7.5 MG tablet Take 1 tablet (7.5 mg total) by mouth daily as needed. 08/03/23   Jodie Lavern CROME, MD  metFORMIN  (GLUCOPHAGE ) 1000 MG tablet Take 1 tablet (1,000 mg total) by mouth 2 (two) times daily with a meal. 04/29/23   Jodie Lavern CROME, MD  metoprolol  tartrate (LOPRESSOR ) 50 MG tablet TAKE 1 TABLET(50 MG) BY MOUTH TWICE DAILY 07/20/23   [place], Peter M, MD  pantoprazole  (PROTONIX ) 40 MG tablet TAKE 1 TABLET BY MOUTH DAILY AT NOON 07/04/23   Daneen Damien BROCKS, NP  tamsulosin  (FLOMAX ) 0.4 MG CAPS capsule Take 1 capsule (0.4 mg total) by mouth daily. 04/29/23   Jodie Lavern CROME,  MD  traMADol  (ULTRAM ) 50 MG tablet Take 1 tablet (50 mg total) by mouth 2 (two) times daily as needed for moderate pain (pain score 4-6). 07/01/23   Jodie Lavern CROME, MD    Allergies: Septra  [sulfamethoxazole -trimethoprim ], Cefadroxil, Ciprofloxacin, and Erythromycin    Review of Systems  Constitutional:  Positive for fever.  Respiratory:  Positive for shortness of breath. Negative for cough.   Cardiovascular:  Negative for chest pain.  Gastrointestinal:  Negative for abdominal pain, diarrhea, nausea and vomiting.  Genitourinary:  Negative for dysuria.  Neurological:  Positive for dizziness and weakness. Negative for  headaches.    Updated Vital Signs BP (!) 151/57 (BP Location: Right Arm)   Pulse (!) 103   Temp (!) 101.9 F (38.8 C) (Oral)   Resp 20   Ht 5' 5 (1.651 m)   Wt 86.2 kg   SpO2 94%   BMI 31.62 kg/m   Physical Exam Vitals and nursing note reviewed.  Constitutional:      General: He is not in acute distress.    Appearance: Normal appearance. He is well-developed.  HENT:     Head: Normocephalic and atraumatic.  Eyes:     Conjunctiva/sclera: Conjunctivae normal.  Cardiovascular:     Rate and Rhythm: Tachycardia present. Rhythm irregular.     Heart sounds: No murmur heard. Pulmonary:     Effort: Pulmonary effort is normal. No respiratory distress.     Breath sounds: Normal breath sounds.  Abdominal:     Palpations: Abdomen is soft.     Tenderness: There is no abdominal tenderness. There is no guarding or rebound.  Musculoskeletal:     Cervical back: Neck supple.     Right lower leg: No edema.     Left lower leg: No edema.  Skin:    General: Skin is warm and dry.     Capillary Refill: Capillary refill takes less than 2 seconds.  Neurological:     General: No focal deficit present.     Mental Status: He is alert.  Psychiatric:        Mood and Affect: Mood normal.     (all labs ordered are listed, but only abnormal results are displayed) Labs Reviewed  SARS CORONAVIRUS 2 BY RT PCR  CULTURE, BLOOD (ROUTINE X 2)  CULTURE, BLOOD (ROUTINE X 2)  COMPREHENSIVE METABOLIC PANEL WITH GFR  CBC WITH DIFFERENTIAL/PLATELET  URINALYSIS, ROUTINE W REFLEX MICROSCOPIC  BRAIN NATRIURETIC PEPTIDE  I-STAT CG4 LACTIC ACID, ED  TROPONIN I (HIGH SENSITIVITY)    EKG: EKG Interpretation Date/Time:  Saturday October 22 2023 18:34:05 EDT Ventricular Rate:  111 PR Interval:    QRS Duration:  96 QT Interval:  325 QTC Calculation: 442 R Axis:   92  Text Interpretation: Atrial fibrillation Multiple ventricular premature complexes Right axis deviation Nonspecific repol abnormality,  diffuse leads increased rate and ectopy from prior 2/25 Confirmed by Towana Sharper [phone]) on 10/22/2023 6:35:41 PM  Radiology: No results found.  {Document cardiac monitor, telemetry assessment procedure when appropriate:32947} Procedures   Medications Ordered in the ED  acetaminophen  (TYLENOL ) tablet 650 mg (has no administration in time range)  sodium chloride  0.9 % bolus 500 mL (has no administration in time range)    Clinical Course as of 10/22/23 1835  Sat Oct 22, 2023  1832 Chest x-ray with possible right lower lobe infiltrate.  Awaiting radiology reading. [MB]    Clinical Course User Index [MB] Towana Sharper BROCKS, MD   {Click here for ABCD2, HEART and  other calculators REFRESH Note before signing:1}                              Medical Decision Making Amount and/or Complexity of Data Reviewed Labs: ordered. Radiology: ordered.  Risk OTC drugs.   This patient complains of ***; this involves an extensive number of treatment Options and is a complaint that carries with it a high risk of complications and morbidity. The differential includes ***  I ordered, reviewed and interpreted labs, which included *** I ordered medication *** and reviewed PMP when indicated. I ordered imaging studies which included *** and I independently    visualized and interpreted imaging which showed *** Additional history obtained from *** Previous records obtained and reviewed *** I consulted *** and discussed lab and imaging findings and discussed disposition.  Cardiac monitoring reviewed, *** Social determinants considered, *** Critical Interventions: ***  After the interventions stated above, I reevaluated the patient and found *** Admission and further testing considered, ***   {Document critical care time when appropriate  Document review of labs and clinical decision tools ie CHADS2VASC2, etc  Document your independent review of radiology images and any outside records   Document your discussion with family members, caretakers and with consultants  Document social determinants of health affecting pt's care  Document your decision making why or why not admission, treatments were needed:32947:::1}   Final diagnoses:  None    ED Discharge Orders     None

## 2023-10-22 NOTE — ED Triage Notes (Signed)
 Pt BIB [facility] from home C/O generalized body aches and fatigue since waking this AM. [place]. Denies known sick contacts.

## 2023-10-23 DIAGNOSIS — J189 Pneumonia, unspecified organism: Secondary | ICD-10-CM | POA: Diagnosis not present

## 2023-10-23 LAB — RESPIRATORY PANEL BY PCR

## 2023-10-23 LAB — BASIC METABOLIC PANEL WITH GFR
Anion gap: 9 (ref 5–15)
BUN: 17 mg/dL (ref 8–23)
CO2: 26 mmol/L (ref 22–32)
Calcium: 8.6 mg/dL — ABNORMAL LOW (ref 8.9–10.3)
Chloride: 103 mmol/L (ref 98–111)
Creatinine, Ser: 1.09 mg/dL (ref 0.61–1.24)
GFR, Estimated: >60 mL/min (ref >60)
Glucose, Bld: 147 mg/dL — ABNORMAL HIGH (ref 70–99)
Potassium: 3.8 mmol/L (ref 3.5–5.1)
Sodium: 138 mmol/L (ref 135–145)

## 2023-10-23 LAB — PROCALCITONIN: Procalcitonin: <0.1 ng/mL

## 2023-10-23 LAB — GLUCOSE, CAPILLARY
Glucose-Capillary: 136 mg/dL — ABNORMAL HIGH (ref 70–99)
Glucose-Capillary: 120 mg/dL — ABNORMAL HIGH (ref 70–99)
Glucose-Capillary: 160 mg/dL — ABNORMAL HIGH (ref 70–99)
Glucose-Capillary: 145 mg/dL — ABNORMAL HIGH (ref 70–99)

## 2023-10-23 LAB — CBC
HCT: 27.1 % — ABNORMAL LOW (ref 39.0–52.0)
Hemoglobin: 8 g/dL — ABNORMAL LOW (ref 13.0–17.0)
MCH: 24 pg — ABNORMAL LOW (ref 26.0–34.0)
MCHC: 29.5 g/dL — ABNORMAL LOW (ref 30.0–36.0)
MCV: 81.1 fL (ref 80.0–100.0)
Platelets: 191 K/uL (ref 150–400)
RBC: 3.34 MIL/uL — ABNORMAL LOW (ref 4.22–5.81)
RDW: 18 % — ABNORMAL HIGH (ref 11.5–15.5)
WBC: 9.7 K/uL (ref 4.0–10.5)
nRBC: 0 % (ref 0.0–0.2)

## 2023-10-23 LAB — HEMOGLOBIN A1C
Hgb A1c MFr Bld: 6.1 % — ABNORMAL HIGH (ref 4.8–5.6)
Mean Plasma Glucose: 128.37 mg/dL

## 2023-10-23 LAB — MAGNESIUM: Magnesium: 1.4 mg/dL — ABNORMAL LOW (ref 1.7–2.4)

## 2023-10-23 MED ORDER — MAGNESIUM SULFATE 2 GM/50ML IV SOLN
2.0000 g | Freq: Once | INTRAVENOUS | Status: AC
  Administered 2023-10-23: 2 g via INTRAVENOUS
  Filled 2023-10-23: qty 50

## 2023-10-23 NOTE — Progress Notes (Signed)
 SATURATION QUALIFICATIONS: (This note is used to comply with regulatory documentation for home oxygen)  Patient Saturations on Room Air at Rest = 96%  Patient Saturations on Room Air while Ambulating = 92%  Patient Saturations on n/a Liters of oxygen while Ambulating = n/a%  Please briefly explain why patient needs home oxygen: Did not walk patient on oxygen because he remained above 92% on RA while ambulating

## 2023-10-23 NOTE — Progress Notes (Signed)
 PROGRESS NOTE    [name]  FMW:[id number] DOB: 1928/04/25 DOA: 10/22/2023 PCP: Jodie Lavern CROME, MD    Brief Narrative:   [name] is a 89 y.o. male with medical history significant for A-fib on Eliquis , CAD s/p PCI and CABG x2, HTN, HLD, HFpEF, GERD, BPH, CKD 3A, BPH and MCI who presented to the ED for evaluation of fatigue, muscle aches and SOB and admitted for sepsis secondary to pneumonia.    Assessment and Plan:   Sepsis/Community-acquired pneumonia - Pt presented with fevers, mild SOB and generalized weakness - Chest imaging shows patchy airspace disease by the right lung base - Met sepsis criteria with fever, tachycardia, tachypnea, lactic acidosis and evidence of respiratory infection - Continue IV Rocephin  and azithromycin  - Follow-up blood culture and sputum culture - Wean to room air as able  T2DM - Last A1c 7.9% 5 months ago - Resume home meds  Permanent A-fib - EKG on admission shows A-fib with PVCs - Initially tachycardic but HR now stable in the 70s to 90s - Continue diltiazem , Toprol -XL and Eliquis  - Telemetry    Hypokalemia - Replete   HFpEF  HTN - BP stable with SBP in the 100s to 120s - Last TTE on 05/02/2023 showed EF 50-to 55%, severely dilated LA, and moderate to severe aortic valve stenosis - Patient euvolemic on exam - Continue Toprol  XL and Lasix     CAD s/p PCI and CABG x2 - Patient with no chest pain - Continue atorvastatin  and Eliquis     HLD - Continue atorvastatin     BPH - Continue tamsulosin  and dutasteride    GERD - Continue Protonix    Generalized weakness - In the setting of acute illness - PT/OT eval and treat    DVT prophylaxis:  apixaban  (ELIQUIS ) tablet 5 mg    Code Status: Full Code Family Communication:   Disposition Plan:  Level of care: Telemetry Status is: Inpatient     Consultants:  None   Subjective: Does not wear O2 at home  Objective: Vitals:   10/22/23 2121 10/23/23 0239  10/23/23 0601 10/23/23 0949  BP: 131/64 (!) 153/80 (!) 141/92 (!) 120/59  Pulse: 91 80 (!) 109 81  Resp: 19 20 18 18   Temp: 98.8 F (37.1 C) 99.9 F (37.7 C) 98.6 F (37 C) 98.2 F (36.8 C)  TempSrc: Oral Oral Oral Oral  SpO2: 98% 97% 98% 94%  Weight: 86.2 kg     Height: 5' 6 (1.676 m)      No intake or output data in the 24 hours ending 10/23/23 1134 Filed Weights   10/22/23 1802 10/22/23 2121  Weight: 86.2 kg 86.2 kg    Examination:   General: Appearance:    Frail male in no acute distress     Lungs:     Clear to auscultation bilaterally, respirations unlabored  Heart:    Normal heart rate.      MS:   All extremities are intact.    Neurologic:   Awake, alert       Data Reviewed: I have personally reviewed following labs and imaging studies  CBC: Recent Labs  Lab 10/22/23 1846 10/23/23 0515  WBC 9.8 9.7  NEUTROABS 7.4  --   HGB 8.8* 8.0*  HCT 29.2* 27.1*  MCV 80.4 81.1  PLT 212 191   Basic Metabolic Panel: Recent Labs  Lab 10/22/23 1846 10/23/23 0515  NA 134* 138  K 3.4* 3.8  CL 96* 103  CO2 26 26  GLUCOSE  229* 147*  BUN 19 17  CREATININE 1.29* 1.09  CALCIUM  8.6* 8.6*  MG  --  1.4*   GFR: Estimated Creatinine Clearance: 43.6 mL/min (by C-G formula based on SCr of 1.09 mg/dL). Liver Function Tests: Recent Labs  Lab 10/22/23 1846  AST 16  ALT 11  ALKPHOS 49  BILITOT 1.1  PROT 6.8  ALBUMIN 3.7   No results for input(s): LIPASE, AMYLASE in the last 168 hours. No results for input(s): AMMONIA in the last 168 hours. Coagulation Profile: No results for input(s): INR, PROTIME in the last 168 hours. Cardiac Enzymes: No results for input(s): CKTOTAL, CKMB, CKMBINDEX, TROPONINI in the last 168 hours. BNP (last 3 results) No results for input(s): PROBNP in the last 8760 hours. HbA1C: No results for input(s): HGBA1C in the last 72 hours. CBG: Recent Labs  Lab 10/22/23 2204 10/23/23 0739 10/23/23 1120  GLUCAP 141*  136* 120*   Lipid Profile: No results for input(s): CHOL, HDL, LDLCALC, TRIG, CHOLHDL, LDLDIRECT in the last 72 hours. Thyroid  Function Tests: No results for input(s): TSH, T4TOTAL, FREET4, T3FREE, THYROIDAB in the last 72 hours. Anemia Panel: No results for input(s): VITAMINB12, FOLATE, FERRITIN, TIBC, IRON , RETICCTPCT in the last 72 hours. Sepsis Labs: Recent Labs  Lab 10/22/23 1842 10/22/23 2013 10/23/23 0515  PROCALCITON  --   --  <0.10  LATICACIDVEN 2.1* 1.9  --     Recent Results (from the past 240 hours)  SARS Coronavirus 2 by RT PCR (hospital order, performed in [facility] hospital lab) *cepheid single result test* Anterior Nasal Swab     Status: None   Collection Time: 10/22/23  6:10 PM   Specimen: Anterior Nasal Swab  Result Value Ref Range Status   SARS Coronavirus 2 by RT PCR NEGATIVE NEGATIVE Final    Comment: (NOTE) SARS-CoV-2 target nucleic acids are NOT DETECTED.  The SARS-CoV-2 RNA is generally detectable in upper and lower respiratory specimens during the acute phase of infection. The lowest concentration of SARS-CoV-2 viral copies this assay can detect is 250 copies / mL. A negative result does not preclude SARS-CoV-2 infection and should not be used as the sole basis for treatment or other patient management decisions.  A negative result may occur with improper specimen collection / handling, submission of specimen other than nasopharyngeal swab, presence of viral mutation(s) within the areas targeted by this assay, and inadequate number of viral copies (<250 copies / mL). A negative result must be combined with clinical observations, patient history, and epidemiological information.  Fact Sheet for Patients:   RoadLapTop.co.za  Fact Sheet for Healthcare Providers: http://kim-miller.com/  This test is not yet approved or  cleared by the [place]  FDA and has been  authorized for detection and/or diagnosis of SARS-CoV-2 by FDA under an Emergency Use Authorization (EUA).  This EUA will remain in effect (meaning this test can be used) for the duration of the COVID-19 declaration under Section 564(b)(1) of the Act, 21 U.S.C. section 360bbb-3(b)(1), unless the authorization is terminated or revoked sooner.  Performed at [facility], [address], [place], [place] [address]   Respiratory (~20 pathogens) panel by PCR     Status: None   Collection Time: 10/22/23  6:10 PM   Specimen: Nasopharyngeal Swab; Respiratory  Result Value Ref Range Status   Adenovirus NOT DETECTED NOT DETECTED Final   Coronavirus 229E NOT DETECTED NOT DETECTED Final    Comment: (NOTE) The Coronavirus on the Respiratory Panel, DOES NOT test for the  novel  Coronavirus (2019 nCoV)    Coronavirus HKU1 NOT DETECTED NOT DETECTED Final   Coronavirus NL63 NOT DETECTED NOT DETECTED Final   Coronavirus OC43 NOT DETECTED NOT DETECTED Final   Metapneumovirus NOT DETECTED NOT DETECTED Final   Rhinovirus / Enterovirus NOT DETECTED NOT DETECTED Final   Influenza A NOT DETECTED NOT DETECTED Final   Influenza B NOT DETECTED NOT DETECTED Final   Parainfluenza Virus 1 NOT DETECTED NOT DETECTED Final   Parainfluenza Virus 2 NOT DETECTED NOT DETECTED Final   Parainfluenza Virus 3 NOT DETECTED NOT DETECTED Final   Parainfluenza Virus 4 NOT DETECTED NOT DETECTED Final   Respiratory Syncytial Virus NOT DETECTED NOT DETECTED Final   Bordetella pertussis NOT DETECTED NOT DETECTED Final   Bordetella Parapertussis NOT DETECTED NOT DETECTED Final   Chlamydophila pneumoniae NOT DETECTED NOT DETECTED Final   Mycoplasma pneumoniae NOT DETECTED NOT DETECTED Final    Comment: Performed at [facility], [address], [place], [place] [address]  Culture, blood (routine x 2)     Status: None (Preliminary result)   Collection Time: 10/22/23  6:43 PM   Specimen: BLOOD RIGHT  FOREARM  Result Value Ref Range Status   Specimen Description   Final    BLOOD RIGHT FOREARM Performed at [facility], [address], [place], [place] [address]    Special Requests   Final    Blood Culture adequate volume Performed at [facility], [address] [place], [place] [address]    Culture   Final    NO GROWTH < 12 HOURS Performed at [facility], [address], [place], [place] [address]    Report Status PENDING  Incomplete  Culture, blood (routine x 2)     Status: None (Preliminary result)   Collection Time: 10/22/23  6:43 PM   Specimen: BLOOD LEFT FOREARM  Result Value Ref Range Status   Specimen Description   Final    BLOOD LEFT FOREARM Performed at [facility], [address] [place], [place] [address]    Special Requests   Final    BOTTLES DRAWN AEROBIC AND ANAEROBIC Blood Culture adequate volume Performed at [facility], [address], [place], [place] [address]    Culture   Final    NO GROWTH < 12 HOURS Performed at [facility], [address], [place], [place] [address]    Report Status PENDING  Incomplete         Radiology Studies: DG Chest Port 1 View Result Date: 10/22/2023 CLINICAL DATA:  Fever, body aches, and fatigue. EXAM: PORTABLE CHEST 1 VIEW COMPARISON:  04/04/2023. FINDINGS: The heart is enlarged the mediastinal contour stable. There is atherosclerotic calcification of the aorta. Patchy airspace disease is noted at the right lung base. No effusion or pneumothorax is seen. Sternotomy wires are present over the midline. No acute osseous abnormality. IMPRESSION: Patchy airspace disease at the right lung base, suspicious for pneumonia. Electronically Signed   By: Leita Birmingham M.D.   On: 10/22/2023 18:43        Scheduled Meds:  apixaban   5 mg Oral BID   atorvastatin   40 mg Oral QHS   azithromycin   500 mg Oral Daily   cholecalciferol   1,000 Units Oral Daily   diltiazem    120 mg Oral Daily   dutasteride   0.5 mg Oral Daily   furosemide   40 mg Oral Daily   gabapentin   600 mg  Oral QHS   insulin  aspart  0-15 Units Subcutaneous TID WC   insulin  aspart  0-5 Units Subcutaneous QHS   insulin  glargine  28 Units Subcutaneous Daily   iron  polysaccharides  150 mg Oral Daily   metoprolol  succinate  50 mg Oral BID   pantoprazole   40 mg Oral Daily   tamsulosin   0.4 mg Oral Daily   Continuous Infusions:  cefTRIAXone  (ROCEPHIN )  IV       LOS: 1 day    Time spent: 45 minutes spent on chart review, discussion with nursing staff, consultants, updating family and interview/physical exam; more than 50% of that time was spent in counseling and/or coordination of care.    Harlene RAYMOND Bowl, DO [facility] Available via Epic secure chat 7am-7pm After these hours, please refer to coverage provider listed on [email or web address] 10/23/2023, 11:34 AM

## 2023-10-23 NOTE — Evaluation (Signed)
 Physical Therapy One Time Evaluation Patient Details Name: [name] MRN: [id number] DOB: [birth year] Today's Date: 10/23/2023  History of Present Illness  89 y.o. male admitted on 10/22/23 for sepsis secondary to pneumonia. Past medical history significant for A-fib on Eliquis , CAD s/p PCI and CABG x2, HTN, HLD, HFpEF, GERD, BPH, CKD 3A, BPH and MCI  Clinical Impression  Patient evaluated by Physical Therapy with no further acute PT needs identified. All education has been completed and the patient has no further questions. Pt up in recliner and agreeable to ambulate.  Pt ambulated 200 ft in hallway with RW and reports only fatigue, no symptoms, denies dizziness.  Pt anticipates return home with spouse upon d/c.  No further follow-up Physical Therapy or equipment needs. Pt would benefit from daily ambulation with staff during remainder of admission.  PT is signing off. Thank you for this referral.         If plan is discharge home, recommend the following:     Can travel by private vehicle        Equipment Recommendations None recommended by PT  Recommendations for Other Services       Functional Status Assessment Patient has had a recent decline in their functional status and demonstrates the ability to make significant improvements in function in a reasonable and predictable amount of time.     Precautions / Restrictions Precautions Precautions: Fall      Mobility  Bed Mobility               General bed mobility comments: pt in recliner    Transfers Overall transfer level: Needs assistance Equipment used: Rolling walker (2 wheels) Transfers: Sit to/from Stand Sit to Stand: Supervision                Ambulation/Gait Ambulation/Gait assistance: Contact guard assist, Supervision Gait Distance (Feet): 200 Feet Assistive device: Rolling walker (2 wheels) Gait Pattern/deviations: Step-through pattern, Decreased stride length Gait velocity: decr      General Gait Details: slow but stable with RW, pt denies any symptoms  Stairs            Wheelchair Mobility     Tilt Bed    Modified Rankin (Stroke Patients Only)       Balance                                             Pertinent Vitals/Pain Pain Assessment Pain Assessment: No/denies pain    Home Living Family/patient expects to be discharged to:: Private residence Living Arrangements: Spouse/significant other;Children Available Help at Discharge: Family;Available 24 hours/day Type of Home: House Home Access: Level entry       Home Layout: One level Home Equipment: Agricultural consultant (2 wheels);Cane - single point      Prior Function Prior Level of Function : Independent/Modified Independent                     Extremity/Trunk Assessment        Lower Extremity Assessment Lower Extremity Assessment: Generalized weakness       Communication   Communication Communication: Impaired Factors Affecting Communication: Hearing impaired    Cognition Arousal: Alert Behavior During Therapy: WFL for tasks assessed/performed   PT - Cognitive impairments: No apparent impairments  Following commands: Intact       Cueing       General Comments      Exercises     Assessment/Plan    PT Assessment Patient does not need any further PT services  PT Problem List         PT Treatment Interventions      PT Goals (Current goals can be found in the Care Plan section)  Acute Rehab PT Goals PT Goal Formulation: All assessment and education complete, DC therapy    Frequency       Co-evaluation               AM-PAC PT 6 Clicks Mobility  Outcome Measure Help needed turning from your back to your side while in a flat bed without using bedrails?: A Little Help needed moving from lying on your back to sitting on the side of a flat bed without using bedrails?: A Little Help needed  moving to and from a bed to a chair (including a wheelchair)?: A Little Help needed standing up from a chair using your arms (e.g., wheelchair or bedside chair)?: A Little Help needed to walk in hospital room?: A Little Help needed climbing 3-5 steps with a railing? : A Little 6 Click Score: 18    End of Session Equipment Utilized During Treatment: Gait belt Activity Tolerance: Patient tolerated treatment well Patient left: in chair;with call bell/phone within reach;with chair alarm set   PT Visit Diagnosis: Difficulty in walking, not elsewhere classified (R26.2)    Time: 8857-8848 PT Time Calculation (min) (ACUTE ONLY): 9 min   Charges:   PT Evaluation $PT Eval Low Complexity: 1 Low   PT General Charges $$ ACUTE PT VISIT: 1 Visit        Tari PT, DPT Physical Therapist Acute Rehabilitation Services Office: [phone]   Kati L Payson 10/23/2023, 12:12 PM

## 2023-10-23 NOTE — Plan of Care (Signed)
  Problem: Fluid Volume: Goal: Ability to maintain a balanced intake and output will improve Outcome: Progressing   Problem: Health Behavior/Discharge Planning: Goal: Ability to identify and utilize available resources and services will improve Outcome: Progressing   Problem: Activity: Goal: Risk for activity intolerance will decrease Outcome: Progressing   Problem: Coping: Goal: Level of anxiety will decrease Outcome: Progressing   Problem: Elimination: Goal: Will not experience complications related to bowel motility Outcome: Progressing

## 2023-10-23 NOTE — Plan of Care (Signed)

## 2023-10-24 ENCOUNTER — Other Ambulatory Visit (HOSPITAL_COMMUNITY): Payer: Self-pay

## 2023-10-24 DIAGNOSIS — J189 Pneumonia, unspecified organism: Secondary | ICD-10-CM | POA: Diagnosis not present

## 2023-10-24 LAB — CBC
HCT: 27.4 % — ABNORMAL LOW (ref 39.0–52.0)
Hemoglobin: 8.1 g/dL — ABNORMAL LOW (ref 13.0–17.0)
MCH: 23.9 pg — ABNORMAL LOW (ref 26.0–34.0)
MCHC: 29.6 g/dL — ABNORMAL LOW (ref 30.0–36.0)
MCV: 80.8 fL (ref 80.0–100.0)
Platelets: 210 K/uL (ref 150–400)
RBC: 3.39 MIL/uL — ABNORMAL LOW (ref 4.22–5.81)
RDW: 18.1 % — ABNORMAL HIGH (ref 11.5–15.5)
WBC: 7 K/uL (ref 4.0–10.5)
nRBC: 0 % (ref 0.0–0.2)

## 2023-10-24 LAB — BASIC METABOLIC PANEL WITH GFR
Anion gap: 9 (ref 5–15)
BUN: 16 mg/dL (ref 8–23)
CO2: 26 mmol/L (ref 22–32)
Calcium: 8.5 mg/dL — ABNORMAL LOW (ref 8.9–10.3)
Chloride: 103 mmol/L (ref 98–111)
Creatinine, Ser: 1.2 mg/dL (ref 0.61–1.24)
GFR, Estimated: 56 mL/min — ABNORMAL LOW (ref >60)
Glucose, Bld: 110 mg/dL — ABNORMAL HIGH (ref 70–99)
Potassium: 3.3 mmol/L — ABNORMAL LOW (ref 3.5–5.1)
Sodium: 138 mmol/L (ref 135–145)

## 2023-10-24 LAB — GLUCOSE, CAPILLARY: Glucose-Capillary: 117 mg/dL — ABNORMAL HIGH (ref 70–99)

## 2023-10-24 MED ORDER — INSULIN GLARGINE 100 UNIT/ML ~~LOC~~ SOLN: 28.0000 [IU] | SUBCUTANEOUS | Status: DC

## 2023-10-24 MED ORDER — ALBUTEROL SULFATE HFA 108 (90 BASE) MCG/ACT IN AERS
2.0000 | INHALATION_SPRAY | Freq: Four times a day (QID) | RESPIRATORY_TRACT | 0 refills | Status: AC | PRN
  Filled 2023-10-24: qty 6.7, 20d supply, fill #0

## 2023-10-24 MED ORDER — DOXYCYCLINE HYCLATE 100 MG PO TABS
100.0000 mg | ORAL_TABLET | Freq: Two times a day (BID) | ORAL | 0 refills | Status: DC
  Filled 2023-10-24: qty 6, 3d supply, fill #0

## 2023-10-24 MED ORDER — POTASSIUM CHLORIDE CRYS ER 20 MEQ PO TBCR
40.0000 meq | EXTENDED_RELEASE_TABLET | Freq: Once | ORAL | Status: AC
  Administered 2023-10-24: 40 meq via ORAL
  Filled 2023-10-24: qty 2

## 2023-10-24 MED ORDER — DOXYCYCLINE HYCLATE 100 MG PO TABS
100.0000 mg | ORAL_TABLET | Freq: Two times a day (BID) | ORAL | Status: DC
  Administered 2023-10-24: 100 mg via ORAL
  Filled 2023-10-24: qty 1

## 2023-10-24 MED ORDER — MAGNESIUM 400 MG PO CAPS: ORAL_CAPSULE | ORAL | Status: DC

## 2023-10-24 NOTE — TOC Transition Note (Signed)
 Transition of Care [facility]) - Discharge Note   Patient Details  Name: [name] MRN: [id number] Date of Birth: [birth year]  Transition of Care [facility]) CM/SW Contact:  Bascom Service, RN Phone Number: 10/24/2023, 9:39 AM   Clinical Narrative: d/c home Has own transport home. No CM needs.      Final next level of care: Home/Self Care Barriers to Discharge: No Barriers Identified   Patient Goals and CMS Choice Patient states their goals for this hospitalization and ongoing recovery are:: Home CMS Medicare.gov Compare Post Acute Care list provided to:: Patient Represenative (must comment) (Ann(spouse)) Choice offered to / list presented to : Spouse [place] ownership interest in [facility].provided to:: Spouse    Discharge Placement                       Discharge Plan and Services Additional resources added to the After Visit Summary for     Discharge Planning Services: CM Consult Post Acute Care Choice: Resumption of Svcs/PTA Provider                               Social Drivers of Health (SDOH) Interventions SDOH Screenings   Food Insecurity: No Food Insecurity (10/22/2023)  Housing: Low Risk  (10/22/2023)  Transportation Needs: No Transportation Needs (10/22/2023)  Utilities: Not At Risk (10/22/2023)  Depression (PHQ2-9): Low Risk  (08/02/2023)  Financial Resource Strain: Low Risk  (04/13/2023)  Physical Activity: Insufficiently Active (04/13/2023)  Social Connections: Moderately Isolated (10/22/2023)  Stress: No Stress Concern Present (04/13/2023)  Tobacco Use: Medium Risk (10/22/2023)  Health Literacy: Adequate Health Literacy (04/13/2023)     Readmission Risk Interventions    01/11/2023    5:06 PM  Readmission Risk Prevention Plan  Transportation Screening Not Complete  Transportation Screening Comment Unable to speak to wife or patient.  PCP or Specialist Appt within 3-5 Days Complete  HRI or Home Care Consult Complete  Social  Work Consult for Recovery Care Planning/Counseling Complete  Palliative Care Screening Complete  Medication Review Oceanographer) Referral to Pharmacy

## 2023-10-24 NOTE — Discharge Summary (Signed)
 Physician Discharge Summary  [name] FMW:[id number] DOB: 09/17/1928 DOA: 10/22/2023  PCP: Jeffrey Lavern CROME, MD  Admit date: 10/22/2023 Discharge date: 10/24/2023  Admitted From: home Discharge disposition: home   Recommendations for Outpatient Follow-Up:   BMP at next office visit adding oral magnesium  to home regimen   Discharge Diagnosis:   Active Problems:   Chronic heart failure with preserved ejection fraction (HCC)   Hypokalemia   Sepsis without acute organ dysfunction (HCC)   Sepsis due to pneumonia [facility])   Generalized weakness   Permanent atrial fibrillation (HCC)   Community acquired pneumonia of right lower lobe of lung    Discharge Condition: Improved.  Diet recommendation:   Regular.  Wound care: None.  Code status: Full.   History of Present Illness:   [name] is a 89 y.o. male with medical history significant for A-fib on Eliquis , CAD s/p PCI and CABG x2, HTN, HLD, HFpEF, GERD, BPH, CKD 3A, BPH and MCI who presented to the ED for evaluation of fatigue, muscle aches and SOB. [place] and daughter and felt slightly dizzy 1 day. He returned home yesterday and has had subjective fevers and generalized weakness. He has had mild SOB but no cough, chest pain, abdominal pain, nausea, vomiting, dysuria or headache.   ED Course: Initial vitals show temp to 102, RR 25, HR 117, BP 128/59, SpO2 94% on room air. Initial labs significant for sodium 134, K+ 3.4, Leukos 229, creatinine 1.29, WBC 9.8, Hgb 8.8, BNP 427, troponin 25, lactic acid 2.1-1.9,. EKG shows A-fib with PVCs. CXR shows patchy airspace disease at the right lung base. Pt received Tylenol  650 mg x 1, IV NS 500 cc bolus, IV Rocephin  and IV azithromycin . TRH was consulted for admission.      Hospital Course by Problem:    Sepsis/Community-acquired pneumonia versus viral pneumonia (respiratory pathogen panels and  COVID-negative) - Pt presented with fevers, mild SOB and generalized weakness - Chest imaging shows patchy airspace disease by the right lung base - Met sepsis criteria with fever, tachycardia, tachypnea, lactic acidosis and evidence of respiratory infection - V Rocephin  and azithromycin -transition to oral antibiotics - Cultures no growth to date - Weaned to room air   T2DM - Last A1c 7.9% 5 months ago - Resume home meds   Permanent A-fib - EKG on admission shows A-fib with PVCs - Initially tachycardic but HR now stable in the 70s to 90s - Continue diltiazem , Toprol -XL and Eliquis  - Telemetry    Hypokalemia-hypomagnesemia - Repleted   HFpEF  HTN - BP stable with SBP in the 100s to 120s - Last TTE on 05/02/2023 showed EF 50-to 55%, severely dilated LA, and moderate to severe aortic valve stenosis - Patient euvolemic on exam - Continue Toprol  XL and Lasix     CAD s/p PCI and CABG x2 - Patient with no chest pain - Continue atorvastatin  and Eliquis     HLD - Continue atorvastatin     BPH - Continue tamsulosin  and dutasteride    GERD - Continue Protonix    Generalized weakness - In the setting of acute illness - PT/OT eval and treat-no need for follow-up    Medical Consultants:      Discharge Exam:   Vitals:   10/23/23 2211 10/24/23 0534  BP: 120/66 115/70  Pulse: 89 69  Resp: 16 20  Temp: 98.3 F (36.8 C) 98.4 F (36.9 C)  SpO2: 93% 92%  Vitals:   10/23/23 0949 10/23/23 1426 10/23/23 2211 10/24/23 0534  BP: (!) 120/59 (!) 112/52 120/66 115/70  Pulse: 81 88 89 69  Resp: 18 18 16 20   Temp: 98.2 F (36.8 C) 99.7 F (37.6 C) 98.3 F (36.8 C) 98.4 F (36.9 C)  TempSrc: Oral Oral Oral Oral  SpO2: 94% 95% 93% 92%  Weight:      Height:        General exam: Appears calm and comfortable.  Feeling much better wants to go home  The results of significant diagnostics from this hospitalization (including imaging, microbiology, ancillary and laboratory) are  listed below for reference.     Procedures and Diagnostic Studies:   DG Chest Port 1 View Result Date: 10/22/2023 CLINICAL DATA:  Fever, body aches, and fatigue. EXAM: PORTABLE CHEST 1 VIEW COMPARISON:  04/04/2023. FINDINGS: The heart is enlarged the mediastinal contour stable. There is atherosclerotic calcification of the aorta. Patchy airspace disease is noted at the right lung base. No effusion or pneumothorax is seen. Sternotomy wires are present over the midline. No acute osseous abnormality. IMPRESSION: Patchy airspace disease at the right lung base, suspicious for pneumonia. Electronically Signed   By: Leita Birmingham M.D.   On: 10/22/2023 18:43     Labs:   Basic Metabolic Panel: Recent Labs  Lab 10/22/23 1846 10/23/23 0515 10/24/23 0528  NA 134* 138 138  K 3.4* 3.8 3.3*  CL 96* 103 103  CO2 26 26 26   GLUCOSE 229* 147* 110*  BUN 19 17 16   CREATININE 1.29* 1.09 1.20  CALCIUM  8.6* 8.6* 8.5*  MG  --  1.4*  --    GFR Estimated Creatinine Clearance: 39.6 mL/min (by C-G formula based on SCr of 1.2 mg/dL). Liver Function Tests: Recent Labs  Lab 10/22/23 1846  AST 16  ALT 11  ALKPHOS 49  BILITOT 1.1  PROT 6.8  ALBUMIN 3.7   No results for input(s): LIPASE, AMYLASE in the last 168 hours. No results for input(s): AMMONIA in the last 168 hours. Coagulation profile No results for input(s): INR, PROTIME in the last 168 hours.  CBC: Recent Labs  Lab 10/22/23 1846 10/23/23 0515 10/24/23 0528  WBC 9.8 9.7 7.0  NEUTROABS 7.4  --   --   HGB 8.8* 8.0* 8.1*  HCT 29.2* 27.1* 27.4*  MCV 80.4 81.1 80.8  PLT 212 191 210   Cardiac Enzymes: No results for input(s): CKTOTAL, CKMB, CKMBINDEX, TROPONINI in the last 168 hours. BNP: Invalid input(s): POCBNP CBG: Recent Labs  Lab 10/23/23 0739 10/23/23 1120 10/23/23 1636 10/23/23 2148 10/24/23 0742  GLUCAP 136* 120* 160* 145* 117*   D-Dimer No results for input(s): DDIMER in the last 72 hours. Hgb  A1c Recent Labs    10/23/23 0515  HGBA1C 6.1*   Lipid Profile No results for input(s): CHOL, HDL, LDLCALC, TRIG, CHOLHDL, LDLDIRECT in the last 72 hours. Thyroid  function studies No results for input(s): TSH, T4TOTAL, T3FREE, THYROIDAB in the last 72 hours.  Invalid input(s): FREET3 Anemia work up No results for input(s): VITAMINB12, FOLATE, FERRITIN, TIBC, IRON , RETICCTPCT in the last 72 hours. Microbiology Recent Results (from the past 240 hours)  SARS Coronavirus 2 by RT PCR (hospital order, performed in [facility] hospital lab) *cepheid single result test* Anterior Nasal Swab     Status: None   Collection Time: 10/22/23  6:10 PM   Specimen: Anterior Nasal Swab  Result Value Ref Range Status   SARS Coronavirus 2 by RT PCR NEGATIVE  NEGATIVE Final    Comment: (NOTE) SARS-CoV-2 target nucleic acids are NOT DETECTED.  The SARS-CoV-2 RNA is generally detectable in upper and lower respiratory specimens during the acute phase of infection. The lowest concentration of SARS-CoV-2 viral copies this assay can detect is 250 copies / mL. A negative result does not preclude SARS-CoV-2 infection and should not be used as the sole basis for treatment or other patient management decisions.  A negative result may occur with improper specimen collection / handling, submission of specimen other than nasopharyngeal swab, presence of viral mutation(s) within the areas targeted by this assay, and inadequate number of viral copies (<250 copies / mL). A negative result must be combined with clinical observations, patient history, and epidemiological information.  Fact Sheet for Patients:   RoadLapTop.co.za  Fact Sheet for Healthcare Providers: http://kim-miller.com/  This test is not yet approved or  cleared by the [place]  FDA and has been authorized for detection and/or diagnosis of SARS-CoV-2 by FDA under an  Emergency Use Authorization (EUA).  This EUA will remain in effect (meaning this test can be used) for the duration of the COVID-19 declaration under Section 564(b)(1) of the Act, 21 U.S.C. section 360bbb-3(b)(1), unless the authorization is terminated or revoked sooner.  Performed at [facility], [address] [place], [place] [address]   Respiratory (~20 pathogens) panel by PCR     Status: None   Collection Time: 10/22/23  6:10 PM   Specimen: Nasopharyngeal Swab; Respiratory  Result Value Ref Range Status   Adenovirus NOT DETECTED NOT DETECTED Final   Coronavirus 229E NOT DETECTED NOT DETECTED Final    Comment: (NOTE) The Coronavirus on the Respiratory Panel, DOES NOT test for the novel  Coronavirus (2019 nCoV)    Coronavirus HKU1 NOT DETECTED NOT DETECTED Final   Coronavirus NL63 NOT DETECTED NOT DETECTED Final   Coronavirus OC43 NOT DETECTED NOT DETECTED Final   Metapneumovirus NOT DETECTED NOT DETECTED Final   Rhinovirus / Enterovirus NOT DETECTED NOT DETECTED Final   Influenza A NOT DETECTED NOT DETECTED Final   Influenza B NOT DETECTED NOT DETECTED Final   Parainfluenza Virus 1 NOT DETECTED NOT DETECTED Final   Parainfluenza Virus 2 NOT DETECTED NOT DETECTED Final   Parainfluenza Virus 3 NOT DETECTED NOT DETECTED Final   Parainfluenza Virus 4 NOT DETECTED NOT DETECTED Final   Respiratory Syncytial Virus NOT DETECTED NOT DETECTED Final   Bordetella pertussis NOT DETECTED NOT DETECTED Final   Bordetella Parapertussis NOT DETECTED NOT DETECTED Final   Chlamydophila pneumoniae NOT DETECTED NOT DETECTED Final   Mycoplasma pneumoniae NOT DETECTED NOT DETECTED Final    Comment: Performed at [facility], [address] [place], [place] [address]  Culture, blood (routine x 2)     Status: None (Preliminary result)   Collection Time: 10/22/23  6:43 PM   Specimen: BLOOD RIGHT FOREARM  Result Value Ref Range Status   Specimen Description   Final     BLOOD RIGHT FOREARM Performed at [facility], [address] [place], [place] [address]    Special Requests   Final    Blood Culture adequate volume Performed at [facility], [address], [place], [place] [address]    Culture   Final    NO GROWTH 2 DAYS Performed at [facility], [address], [place], [place] [address]    Report Status PENDING  Incomplete  Culture, blood (routine x 2)  Status: None (Preliminary result)   Collection Time: 10/22/23  6:43 PM   Specimen: BLOOD LEFT FOREARM  Result Value Ref Range Status   Specimen Description   Final    BLOOD LEFT FOREARM Performed at [facility], [address], [place], [place] [address]    Special Requests   Final    BOTTLES DRAWN AEROBIC AND ANAEROBIC Blood Culture adequate volume Performed at [facility], [address], [place], [place] [address]    Culture   Final    NO GROWTH 2 DAYS Performed at [facility], [address], [place], [place] [address]    Report Status PENDING  Incomplete     Discharge Instructions:   Discharge Instructions     Diet Carb Modified   Complete by: As directed    Increase activity slowly   Complete by: As directed       Allergies as of 10/24/2023       Reactions   Septra  [sulfamethoxazole -trimethoprim ] Itching   Cefadroxil Other (See Comments)   Unknown reaction    Ciprofloxacin Other (See Comments)   Unknown action    Erythromycin Other (See Comments)   Upsets the stomach        Medication List     TAKE these medications    albuterol  108 (90 Base) MCG/ACT inhaler Commonly known as: VENTOLIN  HFA Inhale 2 puffs into the lungs every 6 (six) hours as needed for wheezing or shortness of breath.   apixaban  5 MG Tabs tablet Commonly known as: ELIQUIS  Take 1 tablet (5 mg total) by mouth 2 (two) times daily.   atorvastatin  40 MG tablet Commonly known as: LIPITOR Take 1 tablet (40 mg total) by  mouth at bedtime.   Cholecalciferol  50 MCG (2000 UT) Tabs Take 1 tablet (2,000 Units total) by mouth daily.   diltiazem  120 MG 24 hr capsule Commonly known as: CARDIZEM  CD Take 1 capsule (120 mg total) by mouth daily.   doxycycline  100 MG tablet Commonly known as: VIBRA -TABS Take 1 tablet (100 mg total) by mouth every 12 (twelve) hours.   dutasteride  0.5 MG capsule Commonly known as: Avodart  Take 1 capsule (0.5 mg total) by mouth daily.   furosemide  40 MG tablet Commonly known as: LASIX  TAKE 1 TABLET(40 MG) BY MOUTH DAILY   gabapentin  300 MG capsule Commonly known as: NEURONTIN  Take 2 capsules (600 mg total) by mouth at bedtime.   insulin  glargine 100 UNIT/ML injection Commonly known as: LANTUS  Inject 0.28 mLs (28 Units total) into the skin every morning.   Magnesium  400 MG Caps Take daily   meclizine  25 MG tablet Commonly known as: ANTIVERT  Take 0.5-1 tablets (12.5-25 mg total) by mouth 2 (two) times daily as needed for dizziness.   meloxicam  7.5 MG tablet Commonly known as: MOBIC  Take 1 tablet (7.5 mg total) by mouth daily as needed.   metFORMIN  1000 MG tablet Commonly known as: GLUCOPHAGE  Take 1 tablet (1,000 mg total) by mouth 2 (two) times daily with a meal.   metoprolol  succinate 100 MG 24 hr tablet Commonly known as: TOPROL -XL Take 50 mg by mouth 2 (two) times daily.   pantoprazole  40 MG tablet Commonly known as: PROTONIX  TAKE 1 TABLET BY MOUTH DAILY AT NOON   Poly-Iron  150 150 MG capsule Generic drug: iron  polysaccharides Take 150 mg by mouth daily.   tamsulosin  0.4 MG Caps capsule Commonly known as: FLOMAX  Take 1 capsule (0.4 mg total) by mouth daily.   traMADol  50  MG tablet Commonly known as: ULTRAM  Take 1 tablet (50 mg total) by mouth 2 (two) times daily as needed for moderate pain (pain score 4-6).          Time coordinating discharge: 45 min  Signed:  Harlene RAYMOND Bowl DO  [facility] 10/24/2023, 10:50 AM

## 2023-10-24 NOTE — Progress Notes (Signed)
 Discharge meds in a secure bag delivered to pt in room by this RN- waiting on family to arrive

## 2023-10-24 NOTE — Evaluation (Signed)
 Occupational Therapy Evaluation Patient Details Name: [name] MRN: [id number] DOB: 1928/07/12 Today's Date: 10/24/2023   History of Present Illness   89 y.o. male admitted on 10/22/23 for sepsis secondary to pneumonia. Past medical history significant for A-fib on Eliquis , CAD s/p PCI and CABG x2, HTN, HLD, HFpEF, GERD, BPH, CKD 3A, BPH and MCI     Clinical Impressions Prior to this admission, patient living with his wife, and still driving. Patient manages his medications and finances indpendently. Patient does not need and DME for mobility and is independent in his ADLs. Please refer to O2 saturation note, however patient does not require home oxygen. Currently, patient is at his baseline and does not require further OT or OT at discharge. OT will sign off at this time, please re-consult if further acute needs arise.      If plan is discharge home, recommend the following:    (None needed)     Functional Status Assessment   Patient has not had a recent decline in their functional status     Equipment Recommendations   None recommended by OT     Recommendations for Other Services         Precautions/Restrictions   Precautions Precautions: Fall     Mobility Bed Mobility               General bed mobility comments: sitting on bench in room    Transfers Overall transfer level: Needs assistance Equipment used: None Transfers: Sit to/from Stand Sit to Stand: Modified independent (Device/Increase time)                  Balance                                           ADL either performed or assessed with clinical judgement   ADL Overall ADL's : At baseline                                       General ADL Comments: Prior to this admission, patient living with his wife, and still driving. Patient manages his medications and finances indpendently. Patient does not need and DME for mobility and  is independent in his ADLs. Please refer to O2 saturation note, however patient does not require home oxygen. Currently, patient is at his baseline and does not require further OT or OT at discharge. OT will sign off at this time, please re-consult if further acute needs arise.     Vision Baseline Vision/History: 1 Wears glasses Ability to See in Adequate Light: 0 Adequate Patient Visual Report: No change from baseline Vision Assessment?: No apparent visual deficits     Perception Perception: Not tested       Praxis Praxis: Not tested       Pertinent Vitals/Pain       Extremity/Trunk Assessment Upper Extremity Assessment Upper Extremity Assessment: Overall WFL for tasks assessed;Right hand dominant   Lower Extremity Assessment Lower Extremity Assessment: Overall WFL for tasks assessed   Cervical / Trunk Assessment Cervical / Trunk Assessment: Kyphotic (minimally)   Communication Communication Communication: Impaired Factors Affecting Communication: Hearing impaired   Cognition Arousal: Alert Behavior During Therapy: WFL for tasks assessed/performed Cognition: No apparent impairments  Following commands: Intact       Cueing  General Comments   Cueing Techniques: Verbal cues      Exercises     Shoulder Instructions      Home Living Family/patient expects to be discharged to:: Private residence Living Arrangements: Spouse/significant other;Children Available Help at Discharge: Family;Available 24 hours/day Type of Home: House Home Access: Level entry     Home Layout: One level               Home Equipment: Agricultural consultant (2 wheels);Cane - single point          Prior Functioning/Environment Prior Level of Function : Independent/Modified Independent                    OT Problem List: Cardiopulmonary status limiting activity   OT Treatment/Interventions:        OT Goals(Current goals can  be found in the care plan section)   Acute Rehab OT Goals Patient Stated Goal: to go home OT Goal Formulation: With patient Time For Goal Achievement: 11/07/23 Potential to Achieve Goals: Good   OT Frequency:       Co-evaluation              AM-PAC OT 6 Clicks Daily Activity     Outcome Measure Help from another person eating meals?: None Help from another person taking care of personal grooming?: None Help from another person toileting, which includes using toliet, bedpan, or urinal?: None Help from another person bathing (including washing, rinsing, drying)?: None Help from another person to put on and taking off regular upper body clothing?: None Help from another person to put on and taking off regular lower body clothing?: None 6 Click Score: 24   End of Session Equipment Utilized During Treatment: Gait belt Nurse Communication: Mobility status;Other (comment) (No need for O2)  Activity Tolerance: Patient tolerated treatment well Patient left: Other (comment) (sitting on bench in room)  OT Visit Diagnosis: Muscle weakness (generalized) (M62.81)                Time: 9087-9057 OT Time Calculation (min): 30 min Charges:  OT General Charges $OT Visit: 1 Visit OT Evaluation $OT Eval Moderate Complexity: 1 Mod OT Treatments $Self Care/Home Management : 8-22 mins  Ronal Gift E. Colleene Swarthout, OTR/L Acute Rehabilitation Services [phone]   Ronal Gift Salt 10/24/2023, 9:54 AM

## 2023-10-24 NOTE — Progress Notes (Signed)
 SATURATION QUALIFICATIONS: (This note is used to comply with regulatory documentation for home oxygen)  Patient Saturations on Room Air at Rest = 95%  Patient Saturations on Room Air while Ambulating = 91%  Patient Saturations on 0 Liters of oxygen while Ambulating = 91%  Please briefly explain why patient needs home oxygen: Patient does not need home oxygen at this time as his saturations remain above 90%.   Ronal Gift E. Kayshaun Polanco, OTR/L Acute Rehabilitation Services [phone]

## 2023-10-26 ENCOUNTER — Inpatient Hospital Stay: Admitting: Family Medicine

## 2023-10-27 ENCOUNTER — Ambulatory Visit: Admitting: Family Medicine

## 2023-10-27 ENCOUNTER — Ambulatory Visit: Payer: Self-pay | Admitting: Family Medicine

## 2023-10-27 ENCOUNTER — Encounter: Payer: Self-pay | Admitting: Family Medicine

## 2023-10-27 VITALS — BP 126/66 | HR 96 | Temp 98.1°F | Ht 66.0 in | Wt 191.0 lb

## 2023-10-27 DIAGNOSIS — E119 Type 2 diabetes mellitus without complications: Secondary | ICD-10-CM | POA: Diagnosis not present

## 2023-10-27 DIAGNOSIS — Z8619 Personal history of other infectious and parasitic diseases: Secondary | ICD-10-CM | POA: Diagnosis not present

## 2023-10-27 DIAGNOSIS — I5032 Chronic diastolic (congestive) heart failure: Secondary | ICD-10-CM

## 2023-10-27 DIAGNOSIS — D509 Iron deficiency anemia, unspecified: Secondary | ICD-10-CM

## 2023-10-27 DIAGNOSIS — E876 Hypokalemia: Secondary | ICD-10-CM | POA: Diagnosis not present

## 2023-10-27 DIAGNOSIS — Z794 Long term (current) use of insulin: Secondary | ICD-10-CM

## 2023-10-27 DIAGNOSIS — J189 Pneumonia, unspecified organism: Secondary | ICD-10-CM | POA: Diagnosis not present

## 2023-10-27 DIAGNOSIS — A419 Sepsis, unspecified organism: Secondary | ICD-10-CM

## 2023-10-27 LAB — BASIC METABOLIC PANEL WITH GFR
BUN: 18 mg/dL (ref 6–23)
CO2: 28 meq/L (ref 19–32)
Calcium: 8.8 mg/dL (ref 8.4–10.5)
Chloride: 105 meq/L (ref 96–112)
Creatinine, Ser: 1.24 mg/dL (ref 0.40–1.50)
GFR: 50.15 mL/min — ABNORMAL LOW (ref >60.00)
Glucose, Bld: 83 mg/dL (ref 70–99)
Potassium: 4 meq/L (ref 3.5–5.1)
Sodium: 143 meq/L (ref 135–145)

## 2023-10-27 NOTE — Progress Notes (Signed)
 Subjective  CC:  Chief Complaint  Patient presents with   Hospitalization Follow-up    10/22/2023 - 10/24/2023 (2 days) [facility]  Pneumonia of the right lung    HPI: [name] is a 89 y.o. male who presents to the office today to address the problems listed above in the chief complaint. 89 year old male with multiple medical problems recently released from hospital due to sepsis likely from pneumonia.  Reviewed all records, labs, chest x-ray findings.  Fortunately he is recovering well.  Still with some productive cough that he reports help with.  No longer having fevers or shortness of breath.  He has had no chest pain.  He has chronic heart failure and this appears stable no lower extremity swelling.  Lab work showed stable chronic heart failure during hospital course.  He is completing a course of doxycycline  after being treated with IV antibiotics in the hospital.  He has known coronary disease and atrial fibrillation.  Neither were active while in the hospital.  Hospital labs did show he was hypokalemic and had a low magnesium  level.  He has not yet started replacing oral magnesium .  He is due for a repeat BMP today.  He has known chronic kidney disease which was stable. Anemia: Last year he had a microcytic anemia that was likely due to chronic GI blood loss.  Colonoscopy was done at that time showing diverticulosis and polyps.  During his recent hospital course, hemoglobin got down to 8.8.  He usually is in the high nines.  He feels at his baseline.  He denies melena or bright red blood per rectum.  Of note, last year he had a normal EGD as well.  He does take iron . Diabetes: Recent A1c was good at 6.1.  This is actually low for him.  He denies symptomatic hypoglycemia.  He takes his blood sugars once or twice daily.  He reports his appetite is improving.  Assessment  1. Sepsis due to pneumonia (HCC)   2. Microcytic anemia   3. Chronic heart failure with preserved  ejection fraction (HCC)   4. Hypokalemia   5. Hypomagnesemia   6. Type 2 diabetes mellitus with diabetic nephropathy, with long-term current use of insulin  (HCC)   7. Insulin -requiring or dependent type II diabetes mellitus (HCC)      Plan  Sepsis: Resolved.  To complete course of doxycycline .  Add Mucinex  DM for cough and congestion.  Lungs are clear today. Discussed anemia: Likely chronic GI bleed.  Will monitor closely.  Recommend iron  once or twice daily.  Will recheck in 3 months. Chronic heart failure stable Recheck potassium.  Add magnesium  Diabetic control is a little tight for him right now.  Monitor avoid hypoglycemia.  Will monitor appetite, fasting sugars and recheck A1c in 3 months.  Follow up: 3 months 01/30/2024  Orders Placed This Encounter  Procedures   Basic metabolic panel with GFR   No orders of the defined types were placed in this encounter.     I reviewed the patients updated PMH, FH, and SocHx.    Patient Active Problem List   Diagnosis Date Noted   History of lower GI bleeding 06/10/2022    Priority: High   Mild cognitive impairment 03/05/2021    Priority: High   Moderate nonproliferative diabetic retinopathy of left eye (HCC) 06/18/2020    Priority: High   CKD stage 3a, GFR 45-59 ml/min (HCC) 05/26/2020    Priority: High   Aortic stenosis, moderate  05/22/2019    Priority: High   Chronic heart failure with preserved ejection fraction (HCC) 03/14/2019    Priority: High   Spinal stenosis of lumbar region 02/21/2018    Priority: High   Diabetic peripheral neuropathy associated with type 2 diabetes mellitus (HCC) 05/05/2017    Priority: High   Atherosclerosis of native coronary artery of native heart without angina pectoris 06/13/2016    Priority: High   CVA (cerebral vascular accident) (HCC) 06/06/2016    Priority: High   Recurrent coronary arteriosclerosis after percutaneous transluminal coronary angioplasty 04/18/2015    Priority: High   CAD  S/P PCI- Nov 2016     Priority: High   Chronic anticoagulation 02/06/2013    Priority: High   Chronic atrial fibrillation (HCC) 07/30/2011    Priority: High   Hx of CABG x 2 1990     Priority: High   Hypertension associated with diabetes (HCC)     Priority: High   Diabetes mellitus with diabetic nephropathy, with long-term current use of insulin  (HCC)     Priority: High   Combined hyperlipidemia associated with type 2 diabetes mellitus (HCC)     Priority: High   Thoracic aortic aneurysm without rupture (HCC) 05/26/2020    Priority: Medium    Degeneration of lumbar intervertebral disc 02/21/2018    Priority: Medium    Chronic vertigo 03/15/2014    Priority: Medium    Obesity (BMI 30.0-34.9) 01/31/2012    Priority: Medium    Enlarged prostate without lower urinary tract symptoms (luts) 01/04/2011    Priority: Medium    Gastro-esophageal reflux disease without esophagitis 12/07/2010    Priority: Medium    Hiatal hernia 05/05/2022    Priority: Low   Osteoarthritis of metacarpophalangeal (MCP) joint of left thumb 01/18/2022    Priority: Low   Stable hemispheric central retinal vein occlusion (CRVO) of right eye 07/21/2020    Priority: Low   Lower extremity edema 03/14/2019    Priority: Low   Epistaxis, recurrent 04/11/2015    Priority: Low   Osteoarthritis, knee 01/31/2012    Priority: Low   Allergic rhinitis 10/28/2011    Priority: Low   Hearing loss 06/08/2011    Priority: Low   Primary osteoarthritis of hand 06/08/2011    Priority: Low   Benign hematuria 08/27/2008    Priority: Low   Sepsis due to pneumonia (HCC) 10/22/2023   Generalized weakness 10/22/2023   Permanent atrial fibrillation (HCC) 10/22/2023   Community acquired pneumonia of right lower lobe of lung 10/22/2023   Stage 3b chronic kidney disease (HCC) 05/09/2023   Sepsis without acute organ dysfunction (HCC) 01/10/2023   Hypokalemia 10/10/2021   No outpatient medications have been marked as taking for  the 10/27/23 encounter (Office Visit) with Jodie Lavern CROME, MD.    Allergies: Patient is allergic to septra  [sulfamethoxazole -trimethoprim ], cefadroxil, ciprofloxacin, and erythromycin. Family History: Patient family history includes Brain cancer in his father; Throat cancer in his brother. Social History:  Patient  reports that he quit smoking about 65 years ago. His smoking use included cigarettes. He started smoking about 75 years ago. He has a 30 pack-year smoking history. He has never used smokeless tobacco. He reports that he does not drink alcohol and does not use drugs.  Review of Systems: Constitutional: Negative for fever malaise or anorexia Cardiovascular: negative for chest pain Respiratory: negative for SOB or persistent cough Gastrointestinal: negative for abdominal pain  Objective  Vitals: BP 126/66   Pulse 96   Temp  98.1 F (36.7 C)   Ht 5' 6 (1.676 m)   Wt 191 lb (86.6 kg)   SpO2 97%   BMI 30.83 kg/m  General: no acute distress , A&Ox3 HEENT: PEERL, conjunctiva normal, neck is supple Cardiovascular: Irregularly irregular, no lower extremity edema Respiratory:  Good breath sounds bilaterally, CTAB with normal respiratory effort Skin:  Warm, no rashes  Commons side effects, risks, benefits, and alternatives for medications and treatment plan prescribed today were discussed, and the patient expressed understanding of the given instructions. Patient is instructed to call or message via MyChart if he/she has any questions or concerns regarding our treatment plan. No barriers to understanding were identified. We discussed Red Flag symptoms and signs in detail. Patient expressed understanding regarding what to do in case of urgent or emergency type symptoms.  Medication list was reconciled, printed and provided to the patient in AVS. Patient instructions and summary information was reviewed with the patient as documented in the AVS. This note was prepared with assistance  of Dragon voice recognition software. Occasional wrong-word or sound-a-like substitutions may have occurred due to the inherent limitations of voice recognition software

## 2023-10-27 NOTE — Patient Instructions (Signed)
 Please return in 3 months for recheck. You may cancel your appt on September 4th.   If you have any questions or concerns, please don't hesitate to send me a message via MyChart or call the office at [phone]. Thank you for visiting with us  today! It's our pleasure caring for you.

## 2023-10-27 NOTE — Progress Notes (Signed)
 See mychart note

## 2023-10-28 LAB — CULTURE, BLOOD (ROUTINE X 2)
Culture: NO GROWTH
Culture: NO GROWTH
Special Requests: ADEQUATE
Special Requests: ADEQUATE

## 2023-11-02 ENCOUNTER — Encounter: Payer: Self-pay | Admitting: Family Medicine

## 2023-11-03 ENCOUNTER — Ambulatory Visit: Admitting: Family Medicine

## 2023-11-03 ENCOUNTER — Ambulatory Visit: Payer: Self-pay

## 2023-11-03 NOTE — Telephone Encounter (Signed)
  FYI Only or Action Required?: Action required by provider: clinical question for provider and demands follow up call today.  Patient was last seen in primary care on 10/27/2023 by Jodie Lavern CROME, MD.  Called Nurse Triage reporting productive cough, Wheezing, and Fatigue.  Symptoms began see triage.  Interventions attempted: Other: see triage.  Symptoms are: see triage.  Triage Disposition: Call PCP Within 24 Hours, See HCP Within 4 Hours (Or PCP Triage)  Patient/caregiver understands and will follow disposition?: No, wishes to speak with PCP    Reason for Disposition  [1] Caller requests to speak ONLY to PCP AND [2] NON-URGENT question  Answer Assessment - Initial Assessment Questions 1. REASON FOR CALL or QUESTION: What is your reason for calling today? or How can I best     Daughter [name] calling back from earlier triage around noon today, refused dispo and requested antibiotics post hospitalization to clear cough, if not improved will schedule him next week. She is very upset that Dr. Jodie has not responded to request, and demanding call back today. [phone]  I need this resolved today either way.  Protocols used: PCP Call - No Triage-A-AH

## 2023-11-03 NOTE — Telephone Encounter (Signed)
 FYI Only or Action Required?: Action required by provider: Refusing disposition, requesting antibx with follow up after if needed, requesting call back to daughter at [phone] asap.  Patient was last seen in primary care on 10/27/2023 by Jodie Lavern CROME, MD.  Called Nurse Triage reporting productive cough, Wheezing, and Fatigue.  Symptoms began a week ago.  Interventions attempted: Prescription medications: antibx 2 days at home after hospital, albuterol  inhaler 2x/day, Rest, hydration, or home remedies, and Other: hospital admission 8/23-8/25 with PCP visit on 8/28.  Symptoms are: unchanged.  Triage Disposition: See HCP Within 4 Hours (Or PCP Triage)  Patient/caregiver understands and will follow disposition?: No, refuses disposition       Message from Lauren C sent at 11/03/2023 12:28 PM EDT  Daughter Elijah (on DPR) calling in regarding pt. Was discharged from hospital for pneumonia (already had hospital fu appt). He is still coughing badly with lots of phlegm. She was wanting to know if he needed more antibiotics. Everything prescribed at hospital stay has been completed. [phone]     Reason for Disposition  Wheezing is present  Answer Assessment - Initial Assessment Questions 1. ONSET: When did the cough begin?      Hospital last weekend 2. SEVERITY: How bad is the cough today?      Doesn't get to point where can't stop coughing, coughing intermittently throughout the day, coughing up phlegm 3. SPUTUM: Describe the color of your sputum (e.g., none, dry cough; clear, white, yellow, green)     Sure it's green, didn't ask him 4. HEMOPTYSIS: Are you coughing up any blood? If Yes, ask: How much? (e.g., flecks, streaks, tablespoons, etc.)     no 5. DIFFICULTY BREATHING: Are you having difficulty breathing? If Yes, ask: How bad is it? (e.g., mild, moderate, severe)      Denies No SOB, went away with hospital 6. FEVER: Do you have a fever? If Yes, ask:  What is your temperature, how was it measured, and when did it start?     no 7. CARDIAC HISTORY: Do you have any history of heart disease? (e.g., heart attack, congestive heart failure)      CHF 8. LUNG HISTORY: Do you have any history of lung disease?  (e.g., pulmonary embolus, asthma, emphysema)     Recent sepsis and pneumonia 9. PE RISK FACTORS: Do you have a history of blood clots? (or: recent major surgery, recent prolonged travel, bedridden)     no 10. OTHER SYMPTOMS: Do you have any other symptoms? (e.g., runny nose, wheezing, chest pain)      Denies chest pain, SOB, dizziness, or weakness       CHF so he wheezes, his usual Albuterol  inhaler, using it twice daily Feeling a little better, not SOB because using the albuterol  Concerned still infection in lungs   In hospital with sepsis and pneumonia, still recovering Definitely feeling a little better but been a week and asking for antibx to clear it out Only sent home from hospital with 2 days antibx Taking mucinex  Still coughing up phlegm   Pharmacy [facility] in [place] Needs call back for when sent  Advised pt be examined shortly, pt daughter refusing at this time, requesting antibx to try first then follow up after if not working. Sending message to PCP office with request for antibx and call back to daughter at [phone]. Advised call back if worsening or new symptoms.  Protocols used: Cough - Acute Productive-A-AH

## 2023-11-04 ENCOUNTER — Telehealth: Payer: Self-pay

## 2023-11-04 NOTE — Telephone Encounter (Signed)
 I spoke to patient this morning.  He says his cough is definitely improving.  No fevers, shortness of breath, chills, fatigue or chest pain.  No indication for antibiotics at this time.  He agrees.  Will continue with Mucinex  DM as needed.

## 2023-11-04 NOTE — Telephone Encounter (Signed)
 Dr. Jodie has spoken with pt  Jeffrey Lavern CROME, MD [place]   11/04/23  9:51 AM Note I spoke to patient this morning.  He says his cough is definitely improving.  No fevers, shortness of breath, chills, fatigue or chest pain.   No indication for antibiotics at this time.  He agrees.  Will continue with Mucinex  DM as needed

## 2023-11-10 ENCOUNTER — Encounter: Payer: Self-pay | Admitting: Podiatry

## 2023-11-10 ENCOUNTER — Ambulatory Visit (INDEPENDENT_AMBULATORY_CARE_PROVIDER_SITE_OTHER): Admitting: Podiatry

## 2023-11-10 VITALS — Ht 66.0 in | Wt 191.0 lb

## 2023-11-10 DIAGNOSIS — M79674 Pain in right toe(s): Secondary | ICD-10-CM | POA: Diagnosis not present

## 2023-11-10 DIAGNOSIS — B351 Tinea unguium: Secondary | ICD-10-CM

## 2023-11-10 DIAGNOSIS — E1142 Type 2 diabetes mellitus with diabetic polyneuropathy: Secondary | ICD-10-CM | POA: Diagnosis not present

## 2023-11-10 NOTE — Progress Notes (Signed)
This patient returns to my office for at risk foot care.  This patient requires this care by a professional since this patient will be at risk due to having diabetic neuropathy and coagulation defect.  Patient is taking eliquiss.  Patient says toenails are painful walking and wearing his shoes.  This patient presents for at risk foot care today.  General Appearance  Alert, conversant and in no acute stress.  Vascular  Dorsalis pedis and posterior tibial  pulses are weakly  palpable  bilaterally.  Capillary return is within normal limits  bilaterally. Temperature is within normal limits  bilaterally.  Neurologic  Senn-Weinstein monofilament wire test within normal limits  bilaterally. Muscle power within normal limits bilaterally.  Nails Thick disfigured discolored nails with subungual debris  from hallux to fifth toes bilaterally. No evidence of bacterial infection or drainage bilaterally.  Orthopedic  No limitations of motion  feet .  No crepitus or effusions noted.  No bony pathology or digital deformities noted.  Skin  normotropic skin with no porokeratosis noted bilaterally.  No signs of infections or ulcers noted.     Onychomycosis  B/L  Consent was obtained for treatment procedures.   Debridement of nails with nail nipper followed by dremel tool. Filed with dremel without incident.    Return office visit    12 weeks                Told patient to return for periodic foot care and evaluation due to potential at risk complications.   Rick Warnick DPM  

## 2023-11-14 NOTE — Progress Notes (Signed)
 Cardiology Office Note   Date:  11/23/2023   ID:  [name], [name] 09, 1930, MRN [id number]  PCP:  Jodie Lavern CROME, MD  Cardiologist:  Maydelin Deming [place], MD EP: None  Chief Complaint  Patient presents with   Coronary Artery Disease   History of Present Illness: [name] is a 89 y.o. male with PMH of CAD s/p CABG in 1990 with subsequent DES to RCA in 2006, permanent atrial fibrillation, chronic diastolic CHF, aortic stenosis, HTN, HLD, carotid artery disease, DM type 2, CVA, who presents for  follow-up.   He was  admitted to the hospital 05/13/19-05/18/19 for acute respiratory failure 2/2 PNA and pleural effusion, managed with IV antibiotics and a thoracentesis. Cardiology followed that admission for assistance with atrial fibrillation with RVR and he was discharged home on diltiazem  360mg  and metoprolol  succinate 100mg  BID for rate control, apixaban  5mg  BID for stroke ppx, and lasix  40mg  daily for CHF. Echo 05/14/19 showed EF 60-65%, moderate concentric LVH, indeterminate LV diastolic function, mild AI, moderate AS, and ascending aortic aneurysm of 4.8cm (up from 4.3cm 09/2018).   He was admitted 8/6-10/07/21 with sepsis due to PNA. Managed with antibiotics. He was readmitted on 8/12-8/14 with edema and some CHF. He was continued on therapy for PNA. Swallowing evaluation was negative for aspiration. BNP 207.5. Echocardiogram with EF 55 to 60%, no regional wall motion abnormality, increasing right ventricular pressure and volume overload.  No significant change from previous. Responded to diuretics. DC on lasix  40 mg bid.   In October he was treated for acute bronchitis.  He presented to the ED on 05/01/2022 with shortness of breath, dizziness, and presyncope. Prior to this he was following with GI for ongoing issues with GI bleeding and progressive blood loss anemia.  He was scheduled for outpatient colonoscopy.  Upon arrival to the ED he was hypotensive and bradycardic (HR  in the 40s-60s). Cardiology was consulted in the setting of bradycardia.  Toprol  and diltiazem  were held with improvement in HR.  Eliquis  was held in the setting of GI bleed.  He tolerated reintroduction of low-dose metoprolol .  Repeat echocardiogram showed progressive aortic stenosis, moderate to severe. Endoscopy showed no significant bleeding.  He was noted to have moderate hiatal hernia.  He did have a number of colon polyps.  GI recommended resuming anticoagulation with Sunday after hospital discharge.  He was discharged home in stable condition on 05/06/2022. He was seen in our office on March 22 and was doing well. He did have Covid 19 but this was mild.    He was seen in the ED in October for left arm pain. Some dyspnea as well. Ecg showed no change. Troponins negative. BNP no change. CXR clear. Given GI cocktail with relief. Denies any chest pain, dyspnea or palpitations. Has some neuropathy in his feet.   He was hospitalized in November 2024 in the setting of sepsis due to community-acquired pneumonia with acute hypoxic respiratory failure.  He was treated with antibiotics. Cardiology was consulted in the setting of atrial fibrillation with RVR.  Diltiazem  was restarted.  Last Echo in March showed EF 50-55% with mod to severe AS, mild AI.   He was admitted 8/23-8/25/25 with PNA and sepsis. Felt to be viral.   On follow up today he is doing well. No palpitations. No chest pain. He does note some intermittent dizziness.     Past Medical History:  Diagnosis Date   Abdominal pain    Acute renal failure  resolved   Aortic stenosis    Arthritis    hands & legs (11/'10/2014)   Ataxia    Bladder outlet obstruction    BPH (benign prostatic hyperplasia)    CAD (coronary artery disease)    a. CABG IN 1989. b. 01/08/2015 CTO of ost LAD, LIMA to LAD not visualized but assumed patent given myoview  finding, occluded SVG to diagonal, 99% mid RCA tx w/ SYNERGY DES 3X28 mm   Chronic heart failure  with preserved ejection fraction (HFpEF) (HCC)    Constipation    Diabetes mellitus type 2, insulin  dependent (HCC)    Epistaxis, recurrent 04/2015   GERD (gastroesophageal reflux disease)    HTN (hypertension)    Hx of bacterial pneumonia    Hyperlipemia    Kidney stones    Mild cognitive impairment 03/05/2021   MMSE 26/30 and 6CIT 9 03/2021 AWV   Permanent atrial fibrillation (HCC)    Pneumonia 04/2019   Rib fractures    left rib fractures being treated with pain medications   Thoracic aortic aneurysm    Urinary tract infection     Enterococcus    Past Surgical History:  Procedure Laterality Date   CARDIAC CATHETERIZATION  1989   CARDIAC CATHETERIZATION N/A 01/08/2015   Procedure: Left Heart Cath and Cors/Grafts Angiography;  Surgeon: Wane Mollett M [place], MD;  Location: [facility] INVASIVE CV LAB;  Service: Cardiovascular;  Laterality: N/A;   CARDIAC CATHETERIZATION  01/08/2015   Procedure: Coronary Stent Intervention;  Surgeon: Lonny Eisen M [place], MD;  Location: [facility] INVASIVE CV LAB;  Service: Cardiovascular;;   CARPAL TUNNEL RELEASE Right 08/2010   CATARACT EXTRACTION W/ INTRAOCULAR LENS  IMPLANT, BILATERAL Bilateral    COLONOSCOPY WITH PROPOFOL  N/A 05/05/2022   Procedure: COLONOSCOPY WITH PROPOFOL ;  Surgeon: Abran Norleen SAILOR, MD;  Location: [facility] ENDOSCOPY;  Service: Gastroenterology;  Laterality: N/A;   CORONARY ANGIOPLASTY  01/08/15   RCA DES   CORONARY ARTERY BYPASS GRAFT  1989   CABG X 2   ESOPHAGOGASTRODUODENOSCOPY (EGD) WITH PROPOFOL  N/A 05/05/2022   Procedure: ESOPHAGOGASTRODUODENOSCOPY (EGD) WITH PROPOFOL ;  Surgeon: Abran Norleen SAILOR, MD;  Location: [facility] ENDOSCOPY;  Service: Gastroenterology;  Laterality: N/A;   IR THORACENTESIS ASP PLEURAL SPACE W/IMG GUIDE  05/14/2019   JOINT REPLACEMENT     POLYPECTOMY  05/05/2022   Procedure: POLYPECTOMY;  Surgeon: Abran Norleen SAILOR, MD;  Location: [facility] ENDOSCOPY;  Service: Gastroenterology;;   TONSILLECTOMY     TOTAL HIP ARTHROPLASTY Right 2000     Current  Outpatient Medications  Medication Sig Dispense Refill   albuterol  (VENTOLIN  HFA) 108 (90 Base) MCG/ACT inhaler Inhale 2 puffs into the lungs every 6 (six) hours as needed for wheezing or shortness of breath. 6.7 g 0   apixaban  (ELIQUIS ) 5 MG TABS tablet Take 1 tablet (5 mg total) by mouth 2 (two) times daily. 60 tablet 2   atorvastatin  (LIPITOR) 40 MG tablet Take 1 tablet (40 mg total) by mouth at bedtime.     Cholecalciferol  50 MCG (2000 UT) TABS Take 1 tablet (2,000 Units total) by mouth daily.     diltiazem  (CARDIZEM  CD) 120 MG 24 hr capsule Take 1 capsule (120 mg total) by mouth daily. 90 capsule 3   doxycycline  (VIBRA -TABS) 100 MG tablet Take 1 tablet (100 mg total) by mouth every 12 (twelve) hours. 6 tablet 0   dutasteride  (AVODART ) 0.5 MG capsule Take 1 capsule (0.5 mg total) by mouth daily. 90 capsule 3   furosemide  (LASIX ) 40 MG tablet TAKE 1 TABLET(40  MG) BY MOUTH DAILY 90 tablet 1   gabapentin  (NEURONTIN ) 300 MG capsule Take 2 capsules (600 mg total) by mouth at bedtime. 180 capsule 5   insulin  glargine (LANTUS ) 100 UNIT/ML injection Inject 0.28 mLs (28 Units total) into the skin every morning.     iron  polysaccharides (POLY-IRON  150) 150 MG capsule Take 150 mg by mouth daily.     Magnesium  400 MG CAPS Take daily     meclizine  (ANTIVERT ) 25 MG tablet Take 0.5-1 tablets (12.5-25 mg total) by mouth 2 (two) times daily as needed for dizziness. 30 tablet 5   meloxicam  (MOBIC ) 7.5 MG tablet Take 1 tablet (7.5 mg total) by mouth daily as needed. 90 tablet 1   metFORMIN  (GLUCOPHAGE ) 1000 MG tablet Take 1 tablet (1,000 mg total) by mouth 2 (two) times daily with a meal. 180 tablet 3   metoprolol  succinate (TOPROL -XL) 100 MG 24 hr tablet Take 50 mg by mouth 2 (two) times daily.     pantoprazole  (PROTONIX ) 40 MG tablet TAKE 1 TABLET BY MOUTH DAILY AT NOON 90 tablet 3   tamsulosin  (FLOMAX ) 0.4 MG CAPS capsule Take 1 capsule (0.4 mg total) by mouth daily. 90 capsule 3   traMADol  (ULTRAM ) 50 MG  tablet Take 1 tablet (50 mg total) by mouth 2 (two) times daily as needed for moderate pain (pain score 4-6). 60 tablet 5   No current facility-administered medications for this visit.    Allergies:   Septra  [sulfamethoxazole -trimethoprim ], Cefadroxil, Ciprofloxacin, and Erythromycin    Social History:  The patient  reports that he quit smoking about 65 years ago. His smoking use included cigarettes. He started smoking about 75 years ago. He has a 30 pack-year smoking history. He has never used smokeless tobacco. He reports that he does not drink alcohol and does not use drugs.   Family History:  The patient's family history includes Brain cancer in his father; Throat cancer in his brother.    ROS:  Please see the history of present illness.   Otherwise, review of systems are positive for none.   All other systems are reviewed and negative.    PHYSICAL EXAM: VS:  BP 118/74   Pulse 64   Ht 5' 6 (1.676 m)   Wt 191 lb (86.6 kg)   SpO2 99%   BMI 30.83 kg/m  , BMI Body mass index is 30.83 kg/m. GEN: Well nourished, well developed, in no acute distress. Walks with cane HEENT: normal Neck: no JVD, carotid bruits, or masses Cardiac: IRRR; gr 2/6 systolic murmur RUSB>>apex, no rubs. No edema. Respiratory:  clear to auscultation bilaterally, normal work of breathing GI: soft, nontender, nondistended, + BS MS: no deformity or atrophy. No biceps tenderness Skin: warm and dry, no rash Neuro:  Strength and sensation are intact Psych: euthymic mood, full affect    Recent Labs: 01/11/2023: TSH 2.093 10/22/2023: ALT 11; B Natriuretic Peptide 426.5 10/23/2023: Magnesium  1.4 10/24/2023: Hemoglobin 8.1; Platelets 210 10/27/2023: BUN 18; Creatinine, Ser 1.24; Potassium 4.0; Sodium 143    Lipid Panel    Component Value Date/Time   CHOL 147 03/11/2022 1054   TRIG 75.0 03/11/2022 1054   HDL 52.30 03/11/2022 1054   CHOLHDL 3 03/11/2022 1054   VLDL 15.0 03/11/2022 1054   LDLCALC 80 03/11/2022  1054      Wt Readings from Last 3 Encounters:  11/23/23 191 lb (86.6 kg)  11/10/23 191 lb (86.6 kg)  10/27/23 191 lb (86.6 kg)      Other  studies Reviewed: Additional studies/ records that were reviewed today include:   2D echo 05/14/2019 IMPRESSIONS    1. Left ventricular ejection fraction, by estimation, is 60 to 65%. The  left ventricle has normal function. The left ventricle has no regional  wall motion abnormalities. There is moderate concentric left ventricular  hypertrophy. Left ventricular  diastolic parameters are indeterminate.   2. Right ventricular systolic function is normal. The right ventricular  size is normal.   3. The mitral valve is normal in structure. No evidence of mitral valve  regurgitation. No evidence of mitral stenosis.   4. The aortic valve is normal in structure. Aortic valve regurgitation is  mild. Moderate aortic valve stenosis. Aortic valve area, by VTI measures  1.27 cm. Aortic valve mean gradient measures 19.3 mmHg. Aortic valve Vmax  measures 2.75 m/s.   5. Compared with th echo 09/2018, ascending aorta has increased from 4.3  cm to 4.8cm. . Aortic dilatation noted. There is moderate dilatation of  the ascending aorta measuring 48 mm.   6. The inferior vena cava is normal in size with greater than 50%  respiratory variability, suggesting right atrial pressure of 3 mmHg.   Left heart catheterization 2016: Prox RCA lesion, 40% stenosed. LM lesion, 20% stenosed. Ost LAD to Prox LAD lesion, 100% stenosed. SVG . Origin lesion, 100% stenosed. Mid RCA-2 lesion, 70% stenosed. Mid RCA-1 lesion, 99% stenosed. Post intervention, there is a 0% residual stenosis.   1. Severe 2 vessel obstructive CAD. CTO of the origin of the LAD. Critical mid RCA stenosis. 2. Occluded SVG to the diagonal 3. LIMA to the LAD was not visualized but assumed patent based on clinical history and Myoview  findings. 4. Normal LV EDP. 5. Successful stenting of the Mid RCA  with DES. Very difficult procedure due to vessel tortuosity.   Plan: DAPT with ASA and Plavix  for one month then stop ASA and continue Plavix  for at least one year. Resume Coumadin  tomorrow. Will assess LV function with an Echo. Stop prilosec and start protonix . Anticipate DC in am if stable. Patient noted to be bradycardic throughout case so will reduce metoprolol  to 50 mg daily.  CT CHEST WITHOUT CONTRAST   TECHNIQUE: Multidetector CT imaging of the chest was performed following the standard protocol without IV contrast.   COMPARISON:  CT angiography from June 02, 2016 and from October 03, 2017   FINDINGS: Cardiovascular: Post median sternotomy for CABG. Ascending thoracic aorta measuring 4.3 cm with stable appearance. Calcified atheromatous plaque of the thoracic aorta and its branches in the chest.   Heart size is stable, extensive calcified coronary artery disease post CABG and LIMA grafting.   Engorged central pulmonary vasculature 3.2 cm caliber of the main pulmonary artery Limited assessment of cardiovascular structures given lack of intravenous contrast.   Mediastinum/Nodes: Thoracic inlet structures are normal. No axillary lymphadenopathy. No mediastinal lymphadenopathy. No gross hilar lymphadenopathy.   Lungs/Pleura: Bandlike opacity with mild ground-glass attenuation in the LEFT lung base. Bandlike changes are present in the LEFT lower lobe. Resolution of pleural fluid seen on previous chest x-rays from March of 2021   Pulmonary nodule in the superior segment of the RIGHT lower lobe 6 mm unchanged from previous imaging (image 49, series 3) no consolidation. No pleural effusion.   (Image 72, series 3) 4 mm nodule with some surrounding ground-glass opacity in the central LEFT lower lobe   Airways are patent.   Upper Abdomen: Incidental imaging of upper abdominal contents with signs of  vascular disease of the abdominal aorta. Small hiatal hernia. No acute upper  abdominal process.   Musculoskeletal: No acute bone finding. No destructive bone process.   IMPRESSION: 1. Stable appearance of the mild aneurysmal dilation ascending thoracic aorta measuring 4.3 cm. Recommend annual imaging followup by CTA or MRA. This recommendation follows 2010 ACCF/AHA/AATS/ACR/ASA/SCA/SCAI/SIR/STS/SVM Guidelines for the Diagnosis and Management of Patients with Thoracic Aortic Disease. Circulation. 2010; 121: Z733-z630. Aortic aneurysm NOS (ICD10-I71.9) 2. Stable 6 mm nodule in the superior segment of the RIGHT lower lobe. 3. 4 mm nodule with some surrounding ground-glass opacity in the central LEFT lower lobe. This is new compared to prior imaging. No follow-up needed if patient is low-risk. Non-contrast chest CT can be considered in 12 months if patient is high-risk. This recommendation follows the consensus statement: Guidelines for Management of Incidental Pulmonary Nodules Detected on CT Images: From the Fleischner Society 2017; Radiology 2017; 284:228-243. 4. Resolution of pleural fluid seen on previous chest x-rays from March of 2021. Bandlike opacity in the area of prior LEFT lower lobe airspace disease suggests mixture of atelectasis and post infectious scarring. 5. Stable signs of vascular disease of the thoracic aorta and its branches in the chest. 6. Aortic atherosclerosis.   Aortic Atherosclerosis (ICD10-I70.0).   Aortic aneurysm NOS (ICD10-I71.9).    Echo 05/02/23: IMPRESSIONS     1. Left ventricular ejection fraction, by estimation, is 50 to 55%. The  left ventricle has low normal function. The left ventricle has no regional  wall motion abnormalities. Left ventricular diastolic parameters are  indeterminate. The average left  ventricular global longitudinal strain is -9.6 %. The global longitudinal  strain is abnormal.   2. Right ventricular systolic function is moderately reduced. The right  ventricular size is normal. There is normal  pulmonary artery systolic  pressure.   3. Left atrial size was severely dilated.   4. The mitral valve is abnormal. Trivial mitral valve regurgitation. No  evidence of mitral stenosis. Moderate mitral annular calcification.   5. AS mild by gradient, but severe by AVA and DI (0.21). Stroke volume  inaccurate, but cannot exclude low flow low gradient severe AS. The aortic  valve is calcified. Aortic valve regurgitation is mild. Moderate to severe  aortic valve stenosis. Aortic  valve area, by VTI measures 0.73 cm. Aortic valve mean gradient measures  10.8 mmHg. Aortic valve Vmax measures 2.24 m/s.   6. The inferior vena cava is normal in size with <50% respiratory  variability, suggesting right atrial pressure of 8 mmHg.   Comparison(s): Changes from prior study are noted. EF slightly decreased,  though still low normal, on current study. Moderate to severe aortic  stenosis.  ASSESSMENT AND PLAN:  1. CAD s/p CABG in 1990 with DES to RCA in 2006:  - Not on aspirin  given need for anticoagulation - no significant angina - continue Cardizem , metoprolol , statin  2. Permanent atrial fibrillation: HR is well controlled.  - No overt bleeding - continue metoprolol  and diltiazem   - Continue eliquis   - had panendoscopy as noted. - last Hgb normal.  3. Acute on Chronic diastolic CHF: - appears euvolemic today - patient taking lasix  every  day. - restrict salt intake - EF 50-55%   4. HTN: BP is well controlled - Continue metoprolol  succinate, diltiazem , and lasix   5. Aortic stenosis: moderate to severe. Mild AI. Not a great candidate for TAVR  6. Ascending aortic aneurysm:  Stable by recent Echo. Not really a candidate for surgery. No follow  up needed  Disposition:   FU 6 months   Signed, Addisynn Vassell [place], MD  11/23/2023 8:36 AM

## 2023-11-23 ENCOUNTER — Ambulatory Visit: Attending: Cardiology | Admitting: Cardiology

## 2023-11-23 ENCOUNTER — Encounter: Payer: Self-pay | Admitting: Cardiology

## 2023-11-23 VITALS — BP 118/74 | HR 64 | Ht 66.0 in | Wt 191.0 lb

## 2023-11-23 DIAGNOSIS — I2510 Atherosclerotic heart disease of native coronary artery without angina pectoris: Secondary | ICD-10-CM | POA: Insufficient documentation

## 2023-11-23 DIAGNOSIS — Z8673 Personal history of transient ischemic attack (TIA), and cerebral infarction without residual deficits: Secondary | ICD-10-CM | POA: Insufficient documentation

## 2023-11-23 DIAGNOSIS — I350 Nonrheumatic aortic (valve) stenosis: Secondary | ICD-10-CM | POA: Diagnosis not present

## 2023-11-23 DIAGNOSIS — I10 Essential (primary) hypertension: Secondary | ICD-10-CM | POA: Insufficient documentation

## 2023-11-23 DIAGNOSIS — I482 Chronic atrial fibrillation, unspecified: Secondary | ICD-10-CM | POA: Diagnosis not present

## 2023-11-23 NOTE — Patient Instructions (Signed)
 Medication Instructions:  Continue all medications *If you need a refill on your cardiac medications before your next appointment, please call your pharmacy*  Lab Work: None ordered  Testing/Procedures: None ordered  Follow-Up: At [facility], you and your health needs are our priority.  As part of our continuing mission to provide you with exceptional heart care, our providers are all part of one team.  This team includes your primary Cardiologist (physician) and Advanced Practice Providers or APPs (Physician Assistants and Nurse Practitioners) who all work together to provide you with the care you need, when you need it.  Your next appointment:  6 months    Call in Nov to schedule March appointment    Provider:  Dr.Jordan   We recommend signing up for the patient portal called MyChart.  Sign up information is provided on this After Visit Summary.  MyChart is used to connect with patients for Virtual Visits (Telemedicine).  Patients are able to view lab/test results, encounter notes, upcoming appointments, etc.  Non-urgent messages can be sent to your provider as well.   To learn more about what you can do with MyChart, go to [email or web address].

## 2023-12-01 ENCOUNTER — Encounter: Payer: Self-pay | Admitting: Sports Medicine

## 2023-12-01 ENCOUNTER — Ambulatory Visit: Admitting: Sports Medicine

## 2023-12-01 ENCOUNTER — Other Ambulatory Visit: Payer: Self-pay

## 2023-12-01 ENCOUNTER — Ambulatory Visit: Admitting: Physician Assistant

## 2023-12-01 DIAGNOSIS — M7061 Trochanteric bursitis, right hip: Secondary | ICD-10-CM | POA: Diagnosis not present

## 2023-12-01 DIAGNOSIS — G8929 Other chronic pain: Secondary | ICD-10-CM

## 2023-12-01 DIAGNOSIS — M25551 Pain in right hip: Secondary | ICD-10-CM | POA: Diagnosis not present

## 2023-12-01 MED ORDER — BUPIVACAINE HCL 0.25 % IJ SOLN
2.0000 mL | INTRAMUSCULAR | Status: AC | PRN
  Administered 2023-12-01: 2 mL via INTRA_ARTICULAR

## 2023-12-01 MED ORDER — METHYLPREDNISOLONE ACETATE 40 MG/ML IJ SUSP
40.0000 mg | INTRAMUSCULAR | Status: AC | PRN
  Administered 2023-12-01: 40 mg via INTRA_ARTICULAR

## 2023-12-01 MED ORDER — LIDOCAINE HCL 1 % IJ SOLN
2.0000 mL | INTRAMUSCULAR | Status: AC | PRN
  Administered 2023-12-01: 2 mL

## 2023-12-01 NOTE — Progress Notes (Signed)
   Procedure Note  Patient: [name]             Date of Birth: [birth year]           MRN: [id number]             Visit Date: 12/01/2023  Procedures: Visit Diagnoses:  1. Chronic right hip pain    Large Joint Inj: R hip joint on 12/01/2023 9:37 AM Indications: pain Details: 22 G 3.5 in needle, ultrasound-guided anterior approach Medications: 2 mL lidocaine  1 %; 40 mg methylPREDNISolone  acetate 40 MG/ML; 2 mL bupivacaine  0.25 % Outcome: tolerated well, no immediate complications  Procedure: US -guided hip injection, Right After discussion on risks/benefits/indications and informed verbal consent was obtained, a timeout was performed. Patient was lying supine on exam table. The hip was cleaned with chloraprep and alcohol swabs. Then utilizing ultrasound guidance, the patient's hip arthroplasty and acetablum was identified.  There is a small region of fluid collection, likely indicative of a pseudotumor which was identified -not a large enough fluid collection to warrant aspiration.  This area was subsequently injected with 2:2:1 lidocaine :bupivicaine:depomedrol via an in-plane approach with ultrasound visualization of the injectate administered.  Patient tolerated procedure well without immediate complications.  Procedure, treatment alternatives, risks and benefits explained, specific risks discussed. Consent was given by the patient. Immediately prior to procedure a time out was called to verify the correct patient, procedure, equipment, support staff and site/side marked as required. Patient was prepped and draped in the usual sterile fashion.     - patient tolerated procedure well, discussed post-injection protocol - follow-up with Dr. Antoinette Bimler as indicated; I am happy to see them as needed  Lonell Sprang, DO Primary Care Sports Medicine Physician  [facility]  This note was dictated using Dragon naturally speaking software and may contain  errors in syntax, spelling, or content which have not been identified prior to signing this note.

## 2023-12-01 NOTE — Progress Notes (Signed)
 Office Visit Note   Patient: [name]           Date of Birth: August 27, 1928           MRN: [id number] Visit Date: 12/01/2023              Requested by: Jodie Lavern CROME, MD [address] [place],  [place] [address] PCP: Jodie Lavern CROME, MD   Assessment & Plan: Visit Diagnoses:  1. 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  At this point, patient had great response from previous aspiration and and cortisone injection by Dr. Burnetta.  We will refer him to Dr. Burnetta for repeat aspiration injection.  He will follow-up with us  as needed.  Follow-Up Instructions: Return if symptoms worsen or fail to improve.   Orders:  No orders of the defined types were placed in this encounter.  No orders of the defined types were placed in this encounter.     Procedures: No procedures performed   Clinical Data: No additional findings.   Subjective: Chief Complaint  Patient presents with   Right Hip - Pain    HPI patient is a pleasant 89 year old gentleman who comes in today with recurrent right lateral hip pain.  He has been seeing us  for this for a while.  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  He was referred to Dr. Burnetta for ultrasound-guided aspiration of the pseudotumor and cortisone injection which significantly helped until about 1 to 2 months ago.  He is here with recurrent right lateral hip pain.  No pain in the groin.  Symptoms are constant without any specific aggravators.  He does not take medicine for this as nothing helps.  Review of Systems as detailed in HPI.  All others reviewed and are negative.   Objective: Vital Signs: There were no vitals taken for this visit.  Physical Exam well-developed well-nourished gentleman in no acute distress.  Alert and oriented x 3.  Ortho Exam right hip exam: No pain with logroll or FADIR  testing.  He has moderate tenderness to the greater trochanter.  He is neurovascularly intact distally.  Specialty Comments:  No specialty comments available.  Imaging: No new imaging   PMFS History: Patient Active Problem List   Diagnosis Date Noted   Sepsis due to pneumonia (HCC) 10/22/2023   Generalized weakness 10/22/2023   Permanent atrial fibrillation (HCC) 10/22/2023   Community acquired pneumonia of right lower lobe of lung 10/22/2023   Stage 3b chronic kidney disease (HCC) 05/09/2023   Sepsis without acute organ dysfunction (HCC) 01/10/2023   History of lower GI bleeding 06/10/2022   Hiatal hernia 05/05/2022   Osteoarthritis of metacarpophalangeal (MCP) joint of left thumb 01/18/2022   Hypokalemia 10/10/2021   Mild cognitive impairment 03/05/2021   Stable hemispheric central retinal vein occlusion (CRVO) of right eye (HCC) 07/21/2020   Moderate nonproliferative diabetic retinopathy of left eye (HCC) 06/18/2020   Thoracic aortic aneurysm without rupture 05/26/2020   CKD stage 3a, GFR 45-59 ml/min (HCC) 05/26/2020   Aortic stenosis, moderate 05/22/2019   Chronic heart failure with preserved ejection fraction (HCC) 03/14/2019   Lower extremity edema 03/14/2019   Spinal stenosis of lumbar region 02/21/2018   Degeneration of lumbar intervertebral disc 02/21/2018   Diabetic peripheral neuropathy associated with type 2 diabetes mellitus (HCC) 05/05/2017   Atherosclerosis of native coronary artery of native  heart without angina pectoris 06/13/2016   CVA (cerebral vascular accident) (HCC) 06/06/2016   Recurrent coronary arteriosclerosis after percutaneous transluminal coronary angioplasty 04/18/2015   Epistaxis, recurrent 04/11/2015   CAD S/P PCI- Nov 2016    Chronic vertigo 03/15/2014   Chronic anticoagulation 02/06/2013   Obesity (BMI 30.0-34.9) 01/31/2012   Osteoarthritis, knee 01/31/2012   Allergic rhinitis 10/28/2011   Chronic atrial fibrillation (HCC) 07/30/2011    Hearing loss 06/08/2011   Primary osteoarthritis of hand 06/08/2011   Enlarged prostate without lower urinary tract symptoms (luts) 01/04/2011   Gastro-esophageal reflux disease without esophagitis 12/07/2010   Hx of CABG x 2 1990    Hypertension associated with diabetes (HCC)    Diabetes mellitus with diabetic nephropathy, with long-term current use of insulin  (HCC)    Combined hyperlipidemia associated with type 2 diabetes mellitus (HCC)    Benign hematuria 08/27/2008   Past Medical History:  Diagnosis Date   Abdominal pain    Acute renal failure     resolved   Aortic stenosis    Arthritis    hands & legs (11/'10/2014)   Ataxia    Bladder outlet obstruction    BPH (benign prostatic hyperplasia)    CAD (coronary artery disease)    a. CABG IN 1989. b. 01/08/2015 CTO of ost LAD, LIMA to LAD not visualized but assumed patent given myoview  finding, occluded SVG to diagonal, 99% mid RCA tx w/ SYNERGY DES 3X28 mm   Chronic heart failure with preserved ejection fraction (HFpEF) (HCC)    Constipation    Diabetes mellitus type 2, insulin  dependent (HCC)    Epistaxis, recurrent 04/2015   GERD (gastroesophageal reflux disease)    HTN (hypertension)    Hx of bacterial pneumonia    Hyperlipemia    Kidney stones    Mild cognitive impairment 03/05/2021   MMSE 26/30 and 6CIT 9 03/2021 AWV   Permanent atrial fibrillation (HCC)    Pneumonia 04/2019   Rib fractures    left rib fractures being treated with pain medications   Thoracic aortic aneurysm    Urinary tract infection     Enterococcus    Family History  Problem Relation Age of Onset   Brain cancer Father    Throat cancer Brother    Liver disease Neg Hx    Colon cancer Neg Hx    Esophageal cancer Neg Hx     Past Surgical History:  Procedure Laterality Date   CARDIAC CATHETERIZATION  1989   CARDIAC CATHETERIZATION N/A 01/08/2015   Procedure: Left Heart Cath and Cors/Grafts Angiography;  Surgeon: Peter M Swaziland, MD;  Location:  [facility]VASIVE CV [facility];  Service: Cardiovascular;  Laterality: N/A;   CARDIAC CATHETERIZATION  01/08/2015   Procedure: Coronary Stent Intervention;  Surgeon: Peter M Swaziland, MD;  Location: [facility] INVASIVE CV LAB;  Service: Cardiovascular;;   CARPAL TUNNEL RELEASE Right 08/2010   CATARACT EXTRACTION W/ INTRAOCULAR LENS  IMPLANT, BILATERAL Bilateral    COLONOSCOPY WITH PROPOFOL  N/A 05/05/2022   Procedure: COLONOSCOPY WITH PROPOFOL ;  Surgeon: Abran Norleen SAILOR, MD;  Location: [facility];  Service: Gastroenterology;  Laterality: N/A;   CORONARY ANGIOPLASTY  01/08/15   RCA DES   CORONARY ARTERY BYPASS GRAFT  1989   CABG X 2   ESOPHAGOGASTRODUODENOSCOPY (EGD) WITH PROPOFOL  N/A 05/05/2022   Procedure: ESOPHAGOGASTRODUODENOSCOPY (EGD) WITH PROPOFOL ;  Surgeon: Abran Norleen SAILOR, MD;  Location: [facility];  Service: Gastroenterology;  Laterality: N/A;   IR THORACENTESIS ASP PLEURAL SPACE W/IMG GUIDE  05/14/2019  JOINT REPLACEMENT     POLYPECTOMY  05/05/2022   Procedure: POLYPECTOMY;  Surgeon: Abran Norleen SAILOR, MD;  Location: [facility];  Service: Gastroenterology;;   TONSILLECTOMY     TOTAL HIP ARTHROPLASTY Right 2000   Social History   Occupational History   Occupation: Insurance underwriter   Occupation: retired  Tobacco Use   Smoking status: Former    Current packs/day: 0.00    Average packs/day: 3.0 packs/day for 10.0 years (30.0 ttl pk-yrs)    Types: Cigarettes    Start date: 05/10/1948    Quit date: 05/11/1958    Years since quitting: 65.6   Smokeless tobacco: Never  Vaping Use   Vaping status: Never Used  Substance and Sexual Activity   Alcohol use: No   Drug use: No   Sexual activity: Not Currently

## 2023-12-10 DIAGNOSIS — Z23 Encounter for immunization: Secondary | ICD-10-CM | POA: Diagnosis not present

## 2023-12-15 ENCOUNTER — Ambulatory Visit: Payer: Self-pay

## 2023-12-15 NOTE — Telephone Encounter (Signed)
 FYI Only or Action Required?: FYI only for provider.  Patient was last seen in primary care on 10/27/2023 by Jodie Lavern CROME, MD.  Called Nurse Triage reporting Cough.  Symptoms began several days ago.  Interventions attempted: Nothing.  Symptoms are: gradually worsening.  Triage Disposition: See Physician Within 24 Hours  Patient/caregiver understands and will follow disposition?: Yes      Copied from CRM #[id number]. Topic: Clinical - Red Word Triage >> Dec 15, 2023  3:22 PM Nessti S wrote: [facility] that prompted transfer to Nurse Triage: coughing up mucus Reason for Disposition  SEVERE coughing spells (e.g., whooping sound after coughing, vomiting after coughing)  Answer Assessment - Initial Assessment Questions 1. ONSET: When did the cough begin?      A few days ago  2. SEVERITY: How bad is the cough today?      Severe  3. SPUTUM: Describe the color of your sputum (e.g., none, dry cough; clear, white, yellow, green)     Yellow  4. HEMOPTYSIS: Are you coughing up any blood? If Yes, ask: How much? (e.g., flecks, streaks, tablespoons, etc.)     Denies  5. DIFFICULTY BREATHING: Are you having difficulty breathing? If Yes, ask: How bad is it? (e.g., mild, moderate, severe)      Denies  6. FEVER: Do you have a fever? If Yes, ask: What is your temperature, how was it measured, and when did it start?     Denies  7. OTHER SYMPTOMS: Do you have any other symptoms? (e.g., runny nose, wheezing, chest pain)       Wheezing  Protocols used: Cough - Acute Productive-A-AH

## 2023-12-15 NOTE — Telephone Encounter (Signed)
 Noted

## 2023-12-16 ENCOUNTER — Ambulatory Visit (INDEPENDENT_AMBULATORY_CARE_PROVIDER_SITE_OTHER): Admitting: Family Medicine

## 2023-12-16 ENCOUNTER — Encounter: Payer: Self-pay | Admitting: Family Medicine

## 2023-12-16 VITALS — BP 110/60 | HR 77 | Temp 97.3°F | Ht 66.0 in | Wt 186.8 lb

## 2023-12-16 DIAGNOSIS — I152 Hypertension secondary to endocrine disorders: Secondary | ICD-10-CM

## 2023-12-16 DIAGNOSIS — R059 Cough, unspecified: Secondary | ICD-10-CM

## 2023-12-16 DIAGNOSIS — E1159 Type 2 diabetes mellitus with other circulatory complications: Secondary | ICD-10-CM

## 2023-12-16 LAB — POC COVID19 BINAXNOW: SARS Coronavirus 2 Ag: NEGATIVE

## 2023-12-16 MED ORDER — AMOXICILLIN-POT CLAVULANATE 875-125 MG PO TABS: 1.0000 | ORAL_TABLET | Freq: Two times a day (BID) | ORAL | 0 refills | Status: DC

## 2023-12-16 NOTE — Patient Instructions (Addendum)
 It was very nice to see you today!  VISIT SUMMARY: Today, you were seen for nasal congestion and phlegm production that you've had for the past two to three days. You recently received your COVID, flu, and RSV vaccinations, and your COVID test was negative.  YOUR PLAN: ACUTE UPPER RESPIRATORY INFECTION: You have an acute upper respiratory infection with post-nasal drip and a productive cough, which is improving. -Your COVID test is negative, and your lungs are clear. -I have prescribed Augmentin  to be used if your symptoms worsen or if you develop a fever or chills. -Make sure to drink plenty of fluids. -Follow up with Doctor Jodie if there is no improvement by next week.  Return if symptoms worsen or fail to improve.   Take care, Dr Kennyth  PLEASE NOTE:  If you had any lab tests, please let us  know if you have not heard back within a few days. You may see your results on mychart before we have a chance to review them but we will give you a call once they are reviewed by us .   If we ordered any referrals today, please let us  know if you have not heard from their office within the next week.   If you had any urgent prescriptions sent in today, please check with the pharmacy within an hour of our visit to make sure the prescription was transmitted appropriately.   Please try these tips to maintain a healthy lifestyle:  Eat at least 3 REAL meals and 1-2 snacks per day.  Aim for no more than 5 hours between eating.  If you eat breakfast, please do so within one hour of getting up.   Each meal should contain half fruits/vegetables, one quarter protein, and one quarter carbs (no bigger than a computer mouse)  Cut down on sweet beverages. This includes juice, soda, and sweet tea.   Drink at least 1 glass of water with each meal and aim for at least 8 glasses per day  Exercise at least 150 minutes every week.

## 2023-12-16 NOTE — Progress Notes (Signed)
   [name] is a 89 y.o. male who presents today for an office visit.  Assessment/Plan:  Cough  No red flags.  Rapid COVID-negative.  Likely viral URI.  Reassuring exam.  Symptoms do seem to be improving.  We discussed expected management and conservative measures.  Encouraged hydration.  They can use over-the-counter meds as needed.  Will send a pocket prescription for Augmentin  with instruction to not start unless symptoms worsen or fail to improve over the next several days.  We discussed reasons to return to care.  Follow-up as needed.  Hypertension Blood pressure at goal today on regimen per cardiology.     Subjective:  HPI:  See assessment / plan for status of chronic conditions.   Discussed the use of AI scribe software for clinical note transcription with the patient, who gave verbal consent to proceed.  History of Present Illness [name] is a 89 year old male who presents with nasal congestion and phlegm production.  He has been experiencing nasal congestion and phlegm production for approximately two to three days, described as 'a little bit of a nose dripping' and 'spitting up a lot of phlegm.' He reports that he is spitting up less phlegm than he was a week or two ago.No fevers, chills, or difficulty breathing. He has not tried any treatments for these symptoms yet. Symptoms are improving since yesterday.  He recently received his COVID, flu, and RSV vaccinations a few days ago.          Objective:  Physical Exam: BP 110/60   Pulse 77   Temp (!) 97.3 F (36.3 C) (Temporal)   Ht 5' 6 (1.676 m)   Wt 186 lb 12.8 oz (84.7 kg)   SpO2 97%   BMI 30.15 kg/m   Gen: No acute distress, resting comfortably CV: Regular rate and rhythm with no murmurs appreciated Pulm: Normal work of breathing, clear to auscultation bilaterally with no crackles, wheezes, or rhonchi Neuro: Grossly normal, moves all extremities Psych: Normal affect and  thought content      Jeffrey Schooler M. Kennyth, MD 12/16/2023 9:09 AM

## 2023-12-21 ENCOUNTER — Ambulatory Visit (INDEPENDENT_AMBULATORY_CARE_PROVIDER_SITE_OTHER): Admitting: Family Medicine

## 2023-12-21 ENCOUNTER — Encounter: Payer: Self-pay | Admitting: Family Medicine

## 2023-12-21 ENCOUNTER — Ambulatory Visit

## 2023-12-21 VITALS — BP 136/76 | HR 113 | Temp 100.2°F | Ht 66.0 in | Wt 190.6 lb

## 2023-12-21 DIAGNOSIS — R051 Acute cough: Secondary | ICD-10-CM

## 2023-12-21 DIAGNOSIS — J208 Acute bronchitis due to other specified organisms: Secondary | ICD-10-CM | POA: Diagnosis not present

## 2023-12-21 DIAGNOSIS — R918 Other nonspecific abnormal finding of lung field: Secondary | ICD-10-CM | POA: Diagnosis not present

## 2023-12-21 DIAGNOSIS — B9689 Other specified bacterial agents as the cause of diseases classified elsewhere: Secondary | ICD-10-CM | POA: Diagnosis not present

## 2023-12-21 DIAGNOSIS — Z8701 Personal history of pneumonia (recurrent): Secondary | ICD-10-CM

## 2023-12-21 DIAGNOSIS — R059 Cough, unspecified: Secondary | ICD-10-CM | POA: Diagnosis not present

## 2023-12-21 DIAGNOSIS — I517 Cardiomegaly: Secondary | ICD-10-CM | POA: Diagnosis not present

## 2023-12-21 LAB — POC COVID19 BINAXNOW: SARS Coronavirus 2 Ag: NEGATIVE

## 2023-12-21 LAB — POCT INFLUENZA A/B
Influenza A, POC: NEGATIVE
Influenza B, POC: NEGATIVE

## 2023-12-21 NOTE — Progress Notes (Signed)
 Subjective  CC:  Chief Complaint  Patient presents with   cough    Pt was seen by [facility] and was prescribed some antibiotic and he is still not feeling well. Fever 100.2    HPI: [name] is a 89 y.o. male who presents to the office today to address the problems listed above in the chief complaint. Discussed the use of AI scribe software for clinical note transcription with the patient, who gave verbal consent to proceed.  History of Present Illness [name] is a 89 year old male who presents with respiratory symptoms including sneezing, coughing, and phlegm production.  Upper respiratory symptoms- I reviewed recent office visit notes and treatment plan - Sneezing, coughing, and significant mucus production for the past five to six days - No chest pain, shortness of breath, or pain on deep inspiration - No known low-grade fevers or chills however temp 100.2 here today - Appetite remains unchanged - Feels well except for postnasal drainage and cough.  Response to antibiotic therapy - Began taking an antibiotic prescribed by another physician on Friday - Has been taking the antibiotic daily - No improvement or worsening of symptoms since starting the medication    Assessment  1. Acute bacterial bronchitis   2. Acute cough   3. History of pneumonia      Plan  Assessment and Plan Assessment & Plan Acute cough with upper respiratory symptoms Acute cough and upper respiratory symptoms for 5-6 days, including sneezing, coughing, and significant phlegm production. No shortness of breath, chest pain, or urinary symptoms. Appetite remains unchanged. Symptoms stable, likely viral etiology, but bacterial infection not ruled out due to antibiotic prescription. - Continue current antibiotic regimen. - Initiate Mucinex  DM to thin phlegm and alleviate cough. - Order chest x-ray to evaluate lung condition given history of pneumonia and sepsis.  Lungs sound  clear today. - Advise increased fluid intake.    Follow up: If not improving Orders Placed This Encounter  Procedures   DG Chest 2 View   POC COVID-19   POCT Influenza A/B   No orders of the defined types were placed in this encounter.    I reviewed the patients updated PMH, FH, and SocHx.  Patient Active Problem List   Diagnosis Date Noted   History of lower GI bleeding 06/10/2022    Priority: High   Mild cognitive impairment 03/05/2021    Priority: High   Moderate nonproliferative diabetic retinopathy of left eye (HCC) 06/18/2020    Priority: High   CKD stage 3a, GFR 45-59 ml/min (HCC) 05/26/2020    Priority: High   Aortic stenosis, moderate 05/22/2019    Priority: High   Chronic heart failure with preserved ejection fraction (HCC) 03/14/2019    Priority: High   Spinal stenosis of lumbar region 02/21/2018    Priority: High   Diabetic peripheral neuropathy associated with type 2 diabetes mellitus (HCC) 05/05/2017    Priority: High   Atherosclerosis of native coronary artery of native heart without angina pectoris 06/13/2016    Priority: High   CVA (cerebral vascular accident) (HCC) 06/06/2016    Priority: High   Recurrent coronary arteriosclerosis after percutaneous transluminal coronary angioplasty 04/18/2015    Priority: High   CAD S/P PCI- Nov 2016     Priority: High   Chronic anticoagulation 02/06/2013    Priority: High   Chronic atrial fibrillation (HCC) 07/30/2011    Priority: High   Hx of CABG x 2 1990  Priority: High   Hypertension associated with diabetes (HCC)     Priority: High   Diabetes mellitus with diabetic nephropathy, with long-term current use of insulin  (HCC)     Priority: High   Combined hyperlipidemia associated with type 2 diabetes mellitus (HCC)     Priority: High   Thoracic aortic aneurysm without rupture 05/26/2020    Priority: Medium    Degeneration of lumbar intervertebral disc 02/21/2018    Priority: Medium    Chronic vertigo  03/15/2014    Priority: Medium    Obesity (BMI 30.0-34.9) 01/31/2012    Priority: Medium    Enlarged prostate without lower urinary tract symptoms (luts) 01/04/2011    Priority: Medium    Gastro-esophageal reflux disease without esophagitis 12/07/2010    Priority: Medium    Hiatal hernia 05/05/2022    Priority: Low   Osteoarthritis of metacarpophalangeal (MCP) joint of left thumb 01/18/2022    Priority: Low   Stable hemispheric central retinal vein occlusion (CRVO) of right eye (HCC) 07/21/2020    Priority: Low   Lower extremity edema 03/14/2019    Priority: Low   Epistaxis, recurrent 04/11/2015    Priority: Low   Osteoarthritis, knee 01/31/2012    Priority: Low   Allergic rhinitis 10/28/2011    Priority: Low   Hearing loss 06/08/2011    Priority: Low   Primary osteoarthritis of hand 06/08/2011    Priority: Low   Benign hematuria 08/27/2008    Priority: Low   Sepsis due to pneumonia (HCC) 10/22/2023   Generalized weakness 10/22/2023   Permanent atrial fibrillation (HCC) 10/22/2023   Community acquired pneumonia of right lower lobe of lung 10/22/2023   Stage 3b chronic kidney disease (HCC) 05/09/2023   Sepsis without acute organ dysfunction (HCC) 01/10/2023   Hypokalemia 10/10/2021   Current Meds  Medication Sig   albuterol  (VENTOLIN  HFA) 108 (90 Base) MCG/ACT inhaler Inhale 2 puffs into the lungs every 6 (six) hours as needed for wheezing or shortness of breath.   amoxicillin -clavulanate (AUGMENTIN ) 875-125 MG tablet Take 1 tablet by mouth 2 (two) times daily.   apixaban  (ELIQUIS ) 5 MG TABS tablet Take 1 tablet (5 mg total) by mouth 2 (two) times daily.   atorvastatin  (LIPITOR) 40 MG tablet Take 1 tablet (40 mg total) by mouth at bedtime.   Cholecalciferol  50 MCG (2000 UT) TABS Take 1 tablet (2,000 Units total) by mouth daily.   diltiazem  (CARDIZEM  CD) 120 MG 24 hr capsule Take 1 capsule (120 mg total) by mouth daily.   doxycycline  (VIBRA -TABS) 100 MG tablet Take 1 tablet  (100 mg total) by mouth every 12 (twelve) hours.   dutasteride  (AVODART ) 0.5 MG capsule Take 1 capsule (0.5 mg total) by mouth daily.   furosemide  (LASIX ) 40 MG tablet TAKE 1 TABLET(40 MG) BY MOUTH DAILY   gabapentin  (NEURONTIN ) 300 MG capsule Take 2 capsules (600 mg total) by mouth at bedtime.   insulin  glargine (LANTUS ) 100 UNIT/ML injection Inject 0.28 mLs (28 Units total) into the skin every morning.   iron  polysaccharides (POLY-IRON  150) 150 MG capsule Take 150 mg by mouth daily.   Magnesium  400 MG CAPS Take daily   meclizine  (ANTIVERT ) 25 MG tablet Take 0.5-1 tablets (12.5-25 mg total) by mouth 2 (two) times daily as needed for dizziness.   meloxicam  (MOBIC ) 7.5 MG tablet Take 1 tablet (7.5 mg total) by mouth daily as needed.   metFORMIN  (GLUCOPHAGE ) 1000 MG tablet Take 1 tablet (1,000 mg total) by mouth 2 (two) times daily  with a meal.   metoprolol  succinate (TOPROL -XL) 100 MG 24 hr tablet Take 50 mg by mouth 2 (two) times daily.   pantoprazole  (PROTONIX ) 40 MG tablet TAKE 1 TABLET BY MOUTH DAILY AT NOON   tamsulosin  (FLOMAX ) 0.4 MG CAPS capsule Take 1 capsule (0.4 mg total) by mouth daily.   traMADol  (ULTRAM ) 50 MG tablet Take 1 tablet (50 mg total) by mouth 2 (two) times daily as needed for moderate pain (pain score 4-6).   Allergies: Patient is allergic to septra  [sulfamethoxazole -trimethoprim ], cefadroxil, ciprofloxacin, and erythromycin. Family History: Patient family history includes Brain cancer in his father; Throat cancer in his brother. Social History:  Patient  reports that he quit smoking about 65 years ago. His smoking use included cigarettes. He started smoking about 75 years ago. He has a 30 pack-year smoking history. He has never used smokeless tobacco. He reports that he does not drink alcohol and does not use drugs.  Review of Systems: Constitutional: Negative for fever malaise or anorexia Cardiovascular: negative for chest pain Respiratory: negative for SOB or  persistent cough Gastrointestinal: negative for abdominal pain  Objective  Vitals: BP 136/76   Pulse (!) 113   Temp 100.2 F (37.9 C)   Ht 5' 6 (1.676 m)   Wt 190 lb 9.6 oz (86.5 kg)   SpO2 95%   BMI 30.76 kg/m  General: no acute distress , A&Ox3, no respiratory distress, appears well HEENT: PEERL, conjunctiva normal, neck is supple, congestion present Cardiovascular: Irregularly irregular Respiratory:  Good breath sounds bilaterally, CTAB with normal respiratory effort Skin:  Warm, no rashes  Office Visit on 12/21/2023  Component Date Value Ref Range Status   SARS Coronavirus 2 Ag 12/21/2023 Negative  Negative Final   Influenza A, POC 12/21/2023 Negative  Negative Final   Influenza B, POC 12/21/2023 Negative  Negative Final    Commons side effects, risks, benefits, and alternatives for medications and treatment plan prescribed today were discussed, and the patient expressed understanding of the given instructions. Patient is instructed to call or message via MyChart if he/she has any questions or concerns regarding our treatment plan. No barriers to understanding were identified. We discussed Red Flag symptoms and signs in detail. Patient expressed understanding regarding what to do in case of urgent or emergency type symptoms.  Medication list was reconciled, printed and provided to the patient in AVS. Patient instructions and summary information was reviewed with the patient as documented in the AVS. This note was prepared with assistance of Dragon voice recognition software. Occasional wrong-word or sound-a-like substitutions may have occurred due to the inherent limitations of voice recognition software

## 2023-12-24 ENCOUNTER — Ambulatory Visit: Payer: Self-pay | Admitting: Family Medicine

## 2023-12-24 NOTE — Progress Notes (Signed)
 See mychart note Dear Mr. Hitchman, Your xray shows an atypical infection, but overall improved from before. I hope you are feeling better.  Sincerely, Dr. Jodie

## 2024-01-02 ENCOUNTER — Encounter: Payer: Self-pay | Admitting: Radiology

## 2024-01-02 ENCOUNTER — Ambulatory Visit (INDEPENDENT_AMBULATORY_CARE_PROVIDER_SITE_OTHER): Admitting: Family Medicine

## 2024-01-02 ENCOUNTER — Encounter: Payer: Self-pay | Admitting: Family Medicine

## 2024-01-02 VITALS — BP 136/70 | HR 93 | Temp 98.6°F | Ht 66.0 in | Wt 191.6 lb

## 2024-01-02 DIAGNOSIS — J8489 Other specified interstitial pulmonary diseases: Secondary | ICD-10-CM | POA: Diagnosis not present

## 2024-01-02 DIAGNOSIS — B948 Sequelae of other specified infectious and parasitic diseases: Secondary | ICD-10-CM

## 2024-01-02 DIAGNOSIS — I5032 Chronic diastolic (congestive) heart failure: Secondary | ICD-10-CM

## 2024-01-02 DIAGNOSIS — E1121 Type 2 diabetes mellitus with diabetic nephropathy: Secondary | ICD-10-CM

## 2024-01-02 DIAGNOSIS — R053 Chronic cough: Secondary | ICD-10-CM | POA: Diagnosis not present

## 2024-01-02 DIAGNOSIS — Z794 Long term (current) use of insulin: Secondary | ICD-10-CM | POA: Diagnosis not present

## 2024-01-02 MED ORDER — AZITHROMYCIN 250 MG PO TABS: ORAL_TABLET | ORAL | 0 refills | Status: DC

## 2024-01-02 MED ORDER — PREDNISONE 10 MG PO TABS: ORAL_TABLET | ORAL | 0 refills | Status: DC

## 2024-01-02 NOTE — Progress Notes (Signed)
 Subjective  CC:  Chief Complaint  Patient presents with   Cough    Pt stated that he has had a cold for the past week. It was getting better until he ran out of medicine and then it came back     HPI: [name] is a 89 y.o. male who presents to the office today to address the problems listed above in the chief complaint. Discussed the use of AI scribe software for clinical note transcription with the patient, who gave verbal consent to proceed.  History of Present Illness [name] is a 89 year old male who presents with persistent cough and respiratory symptoms.  Cough and sputum production: 89 year old who was hospitalized in late August with sepsis due to pneumonia.  He recovered well, however in late September came down with an upper respiratory tract infection treated with Augmentin .  Productive cough persisted and he was seen by me about a week ago.  He was stable and was treated with Mucinex  DM and fluids.  He said he did well until about 2 days ago when sputum production resurfaced.  I did check an chest x-ray last week which showed interstitial changes consistent with a viral pneumonitis.  He denies feeling worse but just cannot seem to get over the cough.  He denies shortness of breath but his wife says he gets winded when he walks distances.  He denies chest pain or palpitations. - Persistent cough initially improved with medication taken at 3 PM, 7 PM, and 10 PM, allowing uninterrupted sleep at night - Cough has recurred despite prior improvement - Sputum described as thick and unpleasant - Previously unable to talk without coughing prior to over-the-counter medication, which provided relief - No chest pain or swelling  He has multiple comorbidities including chronic heart failure, diabetes and hypertension.  He has no symptoms of volume overload at this time.  No orthopnea or lower extremity edema changes.  Weight is actually down.  No fevers or  chills since the initial illness in late September.   Assessment  1. Persistent cough after viral respiratory infection   2. Interstitial pneumonitis (HCC)   3. Chronic heart failure with preserved ejection fraction (HCC)   4. Type 2 diabetes mellitus with diabetic nephropathy, with long-term current use of insulin  (HCC)      Plan  Assessment and Plan Assessment & Plan Acute bronchitis with persistent cough and sputum production Persistent cough and sputum production likely due to lingering inflammation in the lungs following a viral chest infection. Previous antibiotic treatment may have been insufficient. Current symptoms include thick, productive cough and shortness of breath on exertion. No chest pain or swelling. Lungs sound clear on examination. - Prescribed prednisone  to reduce lung inflammation, with caution regarding potential increase in blood sugar levels. - Prescribed Mucinex  DM for cough suppression, avoiding decongestants that may raise blood pressure. - Prescribed another round of antibiotics to address potential bacterial component.  Given multiple comorbidities will cover with atypical antibiotic although this could be viral. - Scheduled follow-up appointment in four weeks with a repeat chest x-ray to assess improvement.  Hypertension is controlled. Clinical exam is inconsistent with heart failure exacerbation but it is on the differential.  If not improving, will check BMP Diabetes starting prednisone  taper.  Monitor for hyperglycemia.    Follow up: 4 weeks for recheck No orders of the defined types were placed in this encounter.  Meds ordered this encounter  Medications   azithromycin  (ZITHROMAX )  250 MG tablet    Sig: Take 2 tabs today, then 1 tab daily for 4 days    Dispense:  1 each    Refill:  0   predniSONE  (DELTASONE ) 10 MG tablet    Sig: Take 4 tabs qd x 2 days, 3 qd x 2 days, 2 qd x 2d, 1qd x 3 days    Dispense:  21 tablet    Refill:  0     I  reviewed the patients updated PMH, FH, and SocHx.  Patient Active Problem List   Diagnosis Date Noted   History of lower GI bleeding 06/10/2022    Priority: High   Mild cognitive impairment 03/05/2021    Priority: High   Moderate nonproliferative diabetic retinopathy of left eye (HCC) 06/18/2020    Priority: High   CKD stage 3a, GFR 45-59 ml/min (HCC) 05/26/2020    Priority: High   Aortic stenosis, moderate 05/22/2019    Priority: High   Chronic heart failure with preserved ejection fraction (HCC) 03/14/2019    Priority: High   Spinal stenosis of lumbar region 02/21/2018    Priority: High   Diabetic peripheral neuropathy associated with type 2 diabetes mellitus (HCC) 05/05/2017    Priority: High   Atherosclerosis of native coronary artery of native heart without angina pectoris 06/13/2016    Priority: High   CVA (cerebral vascular accident) (HCC) 06/06/2016    Priority: High   Recurrent coronary arteriosclerosis after percutaneous transluminal coronary angioplasty 04/18/2015    Priority: High   CAD S/P PCI- Nov 2016     Priority: High   Chronic anticoagulation 02/06/2013    Priority: High   Chronic atrial fibrillation (HCC) 07/30/2011    Priority: High   Hx of CABG x 2 1990     Priority: High   Hypertension associated with diabetes (HCC)     Priority: High   Diabetes mellitus with diabetic nephropathy, with long-term current use of insulin  (HCC)     Priority: High   Combined hyperlipidemia associated with type 2 diabetes mellitus (HCC)     Priority: High   Thoracic aortic aneurysm without rupture 05/26/2020    Priority: Medium    Degeneration of lumbar intervertebral disc 02/21/2018    Priority: Medium    Chronic vertigo 03/15/2014    Priority: Medium    Obesity (BMI 30.0-34.9) 01/31/2012    Priority: Medium    Enlarged prostate without lower urinary tract symptoms (luts) 01/04/2011    Priority: Medium    Gastro-esophageal reflux disease without esophagitis 12/07/2010     Priority: Medium    Hiatal hernia 05/05/2022    Priority: Low   Osteoarthritis of metacarpophalangeal (MCP) joint of left thumb 01/18/2022    Priority: Low   Stable hemispheric central retinal vein occlusion (CRVO) of right eye (HCC) 07/21/2020    Priority: Low   Lower extremity edema 03/14/2019    Priority: Low   Epistaxis, recurrent 04/11/2015    Priority: Low   Osteoarthritis, knee 01/31/2012    Priority: Low   Allergic rhinitis 10/28/2011    Priority: Low   Hearing loss 06/08/2011    Priority: Low   Primary osteoarthritis of hand 06/08/2011    Priority: Low   Benign hematuria 08/27/2008    Priority: Low   Sepsis due to pneumonia (HCC) 10/22/2023   Generalized weakness 10/22/2023   Permanent atrial fibrillation (HCC) 10/22/2023   Community acquired pneumonia of right lower lobe of lung 10/22/2023   Stage 3b chronic kidney disease (HCC)  05/09/2023   Sepsis without acute organ dysfunction (HCC) 01/10/2023   Hypokalemia 10/10/2021   Current Meds  Medication Sig   albuterol  (VENTOLIN  HFA) 108 (90 Base) MCG/ACT inhaler Inhale 2 puffs into the lungs every 6 (six) hours as needed for wheezing or shortness of breath.   apixaban  (ELIQUIS ) 5 MG TABS tablet Take 1 tablet (5 mg total) by mouth 2 (two) times daily.   atorvastatin  (LIPITOR) 40 MG tablet Take 1 tablet (40 mg total) by mouth at bedtime.   azithromycin  (ZITHROMAX ) 250 MG tablet Take 2 tabs today, then 1 tab daily for 4 days   Cholecalciferol  50 MCG (2000 UT) TABS Take 1 tablet (2,000 Units total) by mouth daily.   diltiazem  (CARDIZEM  CD) 120 MG 24 hr capsule Take 1 capsule (120 mg total) by mouth daily.   dutasteride  (AVODART ) 0.5 MG capsule Take 1 capsule (0.5 mg total) by mouth daily.   furosemide  (LASIX ) 40 MG tablet TAKE 1 TABLET(40 MG) BY MOUTH DAILY   gabapentin  (NEURONTIN ) 300 MG capsule Take 2 capsules (600 mg total) by mouth at bedtime.   insulin  glargine (LANTUS ) 100 UNIT/ML injection Inject 0.28 mLs (28 Units  total) into the skin every morning.   iron  polysaccharides (POLY-IRON  150) 150 MG capsule Take 150 mg by mouth daily.   Magnesium  400 MG CAPS Take daily   meclizine  (ANTIVERT ) 25 MG tablet Take 0.5-1 tablets (12.5-25 mg total) by mouth 2 (two) times daily as needed for dizziness.   meloxicam  (MOBIC ) 7.5 MG tablet Take 1 tablet (7.5 mg total) by mouth daily as needed.   metFORMIN  (GLUCOPHAGE ) 1000 MG tablet Take 1 tablet (1,000 mg total) by mouth 2 (two) times daily with a meal.   metoprolol  succinate (TOPROL -XL) 100 MG 24 hr tablet Take 50 mg by mouth 2 (two) times daily.   pantoprazole  (PROTONIX ) 40 MG tablet TAKE 1 TABLET BY MOUTH DAILY AT NOON   predniSONE  (DELTASONE ) 10 MG tablet Take 4 tabs qd x 2 days, 3 qd x 2 days, 2 qd x 2d, 1qd x 3 days   tamsulosin  (FLOMAX ) 0.4 MG CAPS capsule Take 1 capsule (0.4 mg total) by mouth daily.   traMADol  (ULTRAM ) 50 MG tablet Take 1 tablet (50 mg total) by mouth 2 (two) times daily as needed for moderate pain (pain score 4-6).   Allergies: Patient is allergic to septra  [sulfamethoxazole -trimethoprim ], cefadroxil, ciprofloxacin, and erythromycin. Family History: Patient family history includes Brain cancer in his father; Throat cancer in his brother. Social History:  Patient  reports that he quit smoking about 65 years ago. His smoking use included cigarettes. He started smoking about 75 years ago. He has a 30 pack-year smoking history. He has never used smokeless tobacco. He reports that he does not drink alcohol and does not use drugs.  Review of Systems: Constitutional: Negative for fever malaise or anorexia Cardiovascular: negative for chest pain Respiratory: negative for SOB or persistent cough Gastrointestinal: negative for abdominal pain  Objective  Vitals: BP 136/70   Pulse 93   Temp 98.6 F (37 C)   Ht 5' 6 (1.676 m)   Wt 191 lb 9.6 oz (86.9 kg)   SpO2 96%   BMI 30.93 kg/m  General: no acute distress , A&Ox3, again appears well.   Minor coughing during visit. HEENT: PEERL, conjunctiva normal, neck is supple Cardiovascular: Irregularly irregular with murmur  respiratory:  Good breath sounds bilaterally, CTAB with normal respiratory effort no significant rales or rhonchi Skin:  Warm, no rashes [place]  side effects, risks, benefits, and alternatives for medications and treatment plan prescribed today were discussed, and the patient expressed understanding of the given instructions. Patient is instructed to call or message via MyChart if he/she has any questions or concerns regarding our treatment plan. No barriers to understanding were identified. We discussed Red Flag symptoms and signs in detail. Patient expressed understanding regarding what to do in case of urgent or emergency type symptoms.  Medication list was reconciled, printed and provided to the patient in AVS. Patient instructions and summary information was reviewed with the patient as documented in the AVS. This note was prepared with assistance of Dragon voice recognition software. Occasional wrong-word or sound-a-like substitutions may have occurred due to the inherent limitations of voice recognition software

## 2024-01-30 ENCOUNTER — Ambulatory Visit: Admitting: Family Medicine

## 2024-02-01 ENCOUNTER — Encounter: Payer: Self-pay | Admitting: Cardiology

## 2024-02-01 ENCOUNTER — Encounter (HOSPITAL_COMMUNITY): Payer: Self-pay | Admitting: Internal Medicine

## 2024-02-01 ENCOUNTER — Other Ambulatory Visit: Payer: Self-pay

## 2024-02-01 ENCOUNTER — Emergency Department (HOSPITAL_COMMUNITY)

## 2024-02-01 ENCOUNTER — Observation Stay (HOSPITAL_COMMUNITY)
Admission: EM | Admit: 2024-02-01 | Discharge: 2024-02-02 | Disposition: A | Attending: Emergency Medicine | Admitting: Emergency Medicine

## 2024-02-01 DIAGNOSIS — Z87891 Personal history of nicotine dependence: Secondary | ICD-10-CM | POA: Insufficient documentation

## 2024-02-01 DIAGNOSIS — I350 Nonrheumatic aortic (valve) stenosis: Secondary | ICD-10-CM | POA: Insufficient documentation

## 2024-02-01 DIAGNOSIS — Z794 Long term (current) use of insulin: Secondary | ICD-10-CM | POA: Insufficient documentation

## 2024-02-01 DIAGNOSIS — N400 Enlarged prostate without lower urinary tract symptoms: Secondary | ICD-10-CM | POA: Insufficient documentation

## 2024-02-01 DIAGNOSIS — I5033 Acute on chronic diastolic (congestive) heart failure: Secondary | ICD-10-CM | POA: Insufficient documentation

## 2024-02-01 DIAGNOSIS — D509 Iron deficiency anemia, unspecified: Secondary | ICD-10-CM | POA: Diagnosis not present

## 2024-02-01 DIAGNOSIS — I4510 Unspecified right bundle-branch block: Secondary | ICD-10-CM | POA: Diagnosis not present

## 2024-02-01 DIAGNOSIS — Z955 Presence of coronary angioplasty implant and graft: Secondary | ICD-10-CM | POA: Diagnosis not present

## 2024-02-01 DIAGNOSIS — R062 Wheezing: Secondary | ICD-10-CM | POA: Diagnosis not present

## 2024-02-01 DIAGNOSIS — R000 Tachycardia, unspecified: Secondary | ICD-10-CM | POA: Diagnosis not present

## 2024-02-01 DIAGNOSIS — E785 Hyperlipidemia, unspecified: Secondary | ICD-10-CM | POA: Diagnosis not present

## 2024-02-01 DIAGNOSIS — I4891 Unspecified atrial fibrillation: Secondary | ICD-10-CM | POA: Diagnosis not present

## 2024-02-01 DIAGNOSIS — Z8673 Personal history of transient ischemic attack (TIA), and cerebral infarction without residual deficits: Secondary | ICD-10-CM | POA: Diagnosis not present

## 2024-02-01 DIAGNOSIS — I482 Chronic atrial fibrillation, unspecified: Secondary | ICD-10-CM | POA: Insufficient documentation

## 2024-02-01 DIAGNOSIS — I110 Hypertensive heart disease with heart failure: Secondary | ICD-10-CM | POA: Diagnosis not present

## 2024-02-01 DIAGNOSIS — Z951 Presence of aortocoronary bypass graft: Secondary | ICD-10-CM | POA: Insufficient documentation

## 2024-02-01 DIAGNOSIS — Z79899 Other long term (current) drug therapy: Secondary | ICD-10-CM | POA: Insufficient documentation

## 2024-02-01 DIAGNOSIS — Z1152 Encounter for screening for COVID-19: Secondary | ICD-10-CM | POA: Insufficient documentation

## 2024-02-01 DIAGNOSIS — I499 Cardiac arrhythmia, unspecified: Secondary | ICD-10-CM | POA: Diagnosis not present

## 2024-02-01 DIAGNOSIS — R0902 Hypoxemia: Secondary | ICD-10-CM | POA: Diagnosis not present

## 2024-02-01 DIAGNOSIS — R0602 Shortness of breath: Secondary | ICD-10-CM | POA: Diagnosis present

## 2024-02-01 DIAGNOSIS — I2510 Atherosclerotic heart disease of native coronary artery without angina pectoris: Secondary | ICD-10-CM | POA: Insufficient documentation

## 2024-02-01 DIAGNOSIS — I509 Heart failure, unspecified: Principal | ICD-10-CM

## 2024-02-01 DIAGNOSIS — I517 Cardiomegaly: Secondary | ICD-10-CM | POA: Diagnosis not present

## 2024-02-01 DIAGNOSIS — J9601 Acute respiratory failure with hypoxia: Principal | ICD-10-CM | POA: Insufficient documentation

## 2024-02-01 DIAGNOSIS — I700 Atherosclerosis of aorta: Secondary | ICD-10-CM | POA: Diagnosis not present

## 2024-02-01 DIAGNOSIS — I491 Atrial premature depolarization: Secondary | ICD-10-CM | POA: Diagnosis not present

## 2024-02-01 DIAGNOSIS — I10 Essential (primary) hypertension: Secondary | ICD-10-CM | POA: Diagnosis not present

## 2024-02-01 DIAGNOSIS — R918 Other nonspecific abnormal finding of lung field: Secondary | ICD-10-CM | POA: Diagnosis not present

## 2024-02-01 DIAGNOSIS — E119 Type 2 diabetes mellitus without complications: Secondary | ICD-10-CM | POA: Diagnosis not present

## 2024-02-01 DIAGNOSIS — R2689 Other abnormalities of gait and mobility: Secondary | ICD-10-CM | POA: Diagnosis not present

## 2024-02-01 LAB — PRO BRAIN NATRIURETIC PEPTIDE: Pro Brain Natriuretic Peptide: 2747 pg/mL — ABNORMAL HIGH (ref <300.0)

## 2024-02-01 LAB — RETICULOCYTES
Immature Retic Fract: 30.2 % — ABNORMAL HIGH (ref 2.3–15.9)
RBC.: 3.38 MIL/uL — ABNORMAL LOW (ref 4.22–5.81)
Retic Count, Absolute: 71.3 K/uL (ref 19.0–186.0)
Retic Ct Pct: 2.1 % (ref 0.4–3.1)

## 2024-02-01 LAB — IRON AND TIBC
Iron: 10 ug/dL — ABNORMAL LOW (ref 45–182)
Saturation Ratios: 3 % — ABNORMAL LOW (ref 17.9–39.5)
TIBC: 377 ug/dL (ref 250–450)
UIBC: 366 ug/dL

## 2024-02-01 LAB — CBC WITH DIFFERENTIAL/PLATELET
Abs Immature Granulocytes: 0.05 K/uL (ref 0.00–0.07)
Basophils Absolute: 0.1 K/uL (ref 0.0–0.1)
Basophils Relative: 1 %
Eosinophils Absolute: 0.4 K/uL (ref 0.0–0.5)
Eosinophils Relative: 4 %
HCT: 26.9 % — ABNORMAL LOW (ref 39.0–52.0)
Hemoglobin: 7.8 g/dL — ABNORMAL LOW (ref 13.0–17.0)
Immature Granulocytes: 0 %
Lymphocytes Relative: 20 %
Lymphs Abs: 2.3 K/uL (ref 0.7–4.0)
MCH: 22.9 pg — ABNORMAL LOW (ref 26.0–34.0)
MCHC: 29 g/dL — ABNORMAL LOW (ref 30.0–36.0)
MCV: 79.1 fL — ABNORMAL LOW (ref 80.0–100.0)
Monocytes Absolute: 0.8 K/uL (ref 0.1–1.0)
Monocytes Relative: 7 %
Neutro Abs: 8 K/uL — ABNORMAL HIGH (ref 1.7–7.7)
Neutrophils Relative %: 68 %
Platelets: 390 K/uL (ref 150–400)
RBC: 3.4 MIL/uL — ABNORMAL LOW (ref 4.22–5.81)
RDW: 18.4 % — ABNORMAL HIGH (ref 11.5–15.5)
WBC: 11.7 K/uL — ABNORMAL HIGH (ref 4.0–10.5)
nRBC: 0 % (ref 0.0–0.2)

## 2024-02-01 LAB — BASIC METABOLIC PANEL WITH GFR
Anion gap: 10 (ref 5–15)
BUN: 20 mg/dL (ref 8–23)
CO2: 27 mmol/L (ref 22–32)
Calcium: 9.4 mg/dL (ref 8.9–10.3)
Chloride: 106 mmol/L (ref 98–111)
Creatinine, Ser: 1.08 mg/dL (ref 0.61–1.24)
GFR, Estimated: >60 mL/min (ref >60)
Glucose, Bld: 157 mg/dL — ABNORMAL HIGH (ref 70–99)
Potassium: 3.6 mmol/L (ref 3.5–5.1)
Sodium: 143 mmol/L (ref 135–145)

## 2024-02-01 LAB — GLUCOSE, CAPILLARY
Glucose-Capillary: 278 mg/dL — ABNORMAL HIGH (ref 70–99)
Glucose-Capillary: 365 mg/dL — ABNORMAL HIGH (ref 70–99)

## 2024-02-01 LAB — RESP PANEL BY RT-PCR (RSV, FLU A&B, COVID)  RVPGX2
Influenza A by PCR: NEGATIVE
Influenza B by PCR: NEGATIVE
Resp Syncytial Virus by PCR: NEGATIVE
SARS Coronavirus 2 by RT PCR: NEGATIVE

## 2024-02-01 LAB — TROPONIN T, HIGH SENSITIVITY
Troponin T High Sensitivity: 38 ng/L — ABNORMAL HIGH (ref 0–19)
Troponin T High Sensitivity: 38 ng/L — ABNORMAL HIGH (ref 0–19)

## 2024-02-01 LAB — CBG MONITORING, ED: Glucose-Capillary: 256 mg/dL — ABNORMAL HIGH (ref 70–99)

## 2024-02-01 LAB — FOLATE: Folate: 17.8 ng/mL (ref >5.9)

## 2024-02-01 LAB — FERRITIN: Ferritin: 55 ng/mL (ref 24–336)

## 2024-02-01 LAB — MAGNESIUM: Magnesium: 1.3 mg/dL — ABNORMAL LOW (ref 1.7–2.4)

## 2024-02-01 LAB — VITAMIN B12: Vitamin B-12: 606 pg/mL (ref 180–914)

## 2024-02-01 MED ORDER — POLYSACCHARIDE IRON COMPLEX 150 MG PO CAPS
150.0000 mg | ORAL_CAPSULE | Freq: Every day | ORAL | Status: DC
  Administered 2024-02-01 – 2024-02-02 (×2): 150 mg via ORAL
  Filled 2024-02-01 (×2): qty 1

## 2024-02-01 MED ORDER — ONDANSETRON HCL 4 MG PO TABS: 4.0000 mg | ORAL_TABLET | Freq: Four times a day (QID) | ORAL | Status: DC | PRN

## 2024-02-01 MED ORDER — ALBUTEROL SULFATE HFA 108 (90 BASE) MCG/ACT IN AERS: 2.0000 | INHALATION_SPRAY | Freq: Four times a day (QID) | RESPIRATORY_TRACT | Status: DC | PRN

## 2024-02-01 MED ORDER — MAGNESIUM OXIDE -MG SUPPLEMENT 400 (240 MG) MG PO TABS
400.0000 mg | ORAL_TABLET | Freq: Every day | ORAL | Status: DC
  Administered 2024-02-01 – 2024-02-02 (×2): 400 mg via ORAL
  Filled 2024-02-01 (×2): qty 1

## 2024-02-01 MED ORDER — INSULIN GLARGINE-YFGN 100 UNIT/ML ~~LOC~~ SOLN
28.0000 [IU] | Freq: Every day | SUBCUTANEOUS | Status: DC
  Administered 2024-02-01 – 2024-02-02 (×2): 28 [IU] via SUBCUTANEOUS
  Filled 2024-02-01 (×2): qty 0.28

## 2024-02-01 MED ORDER — LEVALBUTEROL HCL 0.63 MG/3ML IN NEBU: 0.6300 mg | INHALATION_SOLUTION | Freq: Four times a day (QID) | RESPIRATORY_TRACT | Status: DC | PRN

## 2024-02-01 MED ORDER — DUTASTERIDE 0.5 MG PO CAPS
0.5000 mg | ORAL_CAPSULE | Freq: Every day | ORAL | Status: DC
  Administered 2024-02-01 – 2024-02-02 (×2): 0.5 mg via ORAL
  Filled 2024-02-01 (×2): qty 1

## 2024-02-01 MED ORDER — TAMSULOSIN HCL 0.4 MG PO CAPS
0.4000 mg | ORAL_CAPSULE | Freq: Every day | ORAL | Status: DC
  Administered 2024-02-01 – 2024-02-02 (×2): 0.4 mg via ORAL
  Filled 2024-02-01 (×2): qty 1

## 2024-02-01 MED ORDER — PANTOPRAZOLE SODIUM 40 MG PO TBEC
40.0000 mg | DELAYED_RELEASE_TABLET | Freq: Every day | ORAL | Status: DC
  Administered 2024-02-01 – 2024-02-02 (×2): 40 mg via ORAL
  Filled 2024-02-01 (×2): qty 1

## 2024-02-01 MED ORDER — APIXABAN 5 MG PO TABS
5.0000 mg | ORAL_TABLET | Freq: Two times a day (BID) | ORAL | Status: DC
  Administered 2024-02-01 – 2024-02-02 (×2): 5 mg via ORAL
  Filled 2024-02-01 (×2): qty 1

## 2024-02-01 MED ORDER — INSULIN ASPART 100 UNIT/ML IJ SOLN
0.0000 [IU] | Freq: Every day | INTRAMUSCULAR | Status: DC
  Administered 2024-02-01: 5 [IU] via SUBCUTANEOUS
  Filled 2024-02-01: qty 5

## 2024-02-01 MED ORDER — GABAPENTIN 300 MG PO CAPS
600.0000 mg | ORAL_CAPSULE | Freq: Every day | ORAL | Status: DC
  Administered 2024-02-01: 600 mg via ORAL
  Filled 2024-02-01: qty 2

## 2024-02-01 MED ORDER — VITAMIN D 25 MCG (1000 UNIT) PO TABS
2000.0000 [IU] | ORAL_TABLET | Freq: Every day | ORAL | Status: DC
  Administered 2024-02-01 – 2024-02-02 (×2): 2000 [IU] via ORAL
  Filled 2024-02-01 (×2): qty 2

## 2024-02-01 MED ORDER — ONDANSETRON HCL 4 MG/2ML IJ SOLN: 4.0000 mg | Freq: Four times a day (QID) | INTRAMUSCULAR | Status: DC | PRN

## 2024-02-01 MED ORDER — INSULIN ASPART 100 UNIT/ML IJ SOLN
0.0000 [IU] | Freq: Three times a day (TID) | INTRAMUSCULAR | Status: DC
  Administered 2024-02-01 – 2024-02-02 (×3): 8 [IU] via SUBCUTANEOUS
  Administered 2024-02-02: 3 [IU] via SUBCUTANEOUS
  Administered 2024-02-02: 11 [IU] via SUBCUTANEOUS
  Filled 2024-02-01: qty 11
  Filled 2024-02-01 (×3): qty 8
  Filled 2024-02-01: qty 3

## 2024-02-01 MED ORDER — DILTIAZEM HCL-DEXTROSE 125-5 MG/125ML-% IV SOLN (PREMIX)
5.0000 mg/h | INTRAVENOUS | Status: DC
  Administered 2024-02-01: 5 mg/h via INTRAVENOUS
  Administered 2024-02-01: 12.5 mg/h via INTRAVENOUS
  Filled 2024-02-01 (×4): qty 125

## 2024-02-01 MED ORDER — POTASSIUM CHLORIDE CRYS ER 20 MEQ PO TBCR
40.0000 meq | EXTENDED_RELEASE_TABLET | Freq: Once | ORAL | Status: AC
  Administered 2024-02-01: 40 meq via ORAL
  Filled 2024-02-01: qty 2

## 2024-02-01 MED ORDER — FUROSEMIDE 10 MG/ML IJ SOLN
40.0000 mg | Freq: Once | INTRAMUSCULAR | Status: AC
  Administered 2024-02-01: 40 mg via INTRAVENOUS
  Filled 2024-02-01: qty 4

## 2024-02-01 MED ORDER — ATORVASTATIN CALCIUM 40 MG PO TABS
40.0000 mg | ORAL_TABLET | Freq: Every day | ORAL | Status: DC
  Administered 2024-02-01: 40 mg via ORAL
  Filled 2024-02-01: qty 1

## 2024-02-01 MED ORDER — METOPROLOL SUCCINATE ER 50 MG PO TB24
50.0000 mg | ORAL_TABLET | Freq: Two times a day (BID) | ORAL | Status: DC
  Administered 2024-02-01: 50 mg via ORAL
  Filled 2024-02-01: qty 1

## 2024-02-01 MED ORDER — ACETAMINOPHEN 325 MG PO TABS: 650.0000 mg | ORAL_TABLET | Freq: Four times a day (QID) | ORAL | Status: DC | PRN

## 2024-02-01 MED ORDER — SODIUM CHLORIDE 0.9% FLUSH
3.0000 mL | Freq: Two times a day (BID) | INTRAVENOUS | Status: DC
  Administered 2024-02-01 – 2024-02-02 (×2): 3 mL via INTRAVENOUS

## 2024-02-01 MED ORDER — MAGNESIUM SULFATE 4 GM/100ML IV SOLN
4.0000 g | Freq: Once | INTRAVENOUS | Status: DC
  Administered 2024-02-01: 4 g via INTRAVENOUS
  Filled 2024-02-01: qty 100

## 2024-02-01 MED ORDER — TRAMADOL HCL 50 MG PO TABS: 50.0000 mg | ORAL_TABLET | Freq: Two times a day (BID) | ORAL | Status: DC | PRN

## 2024-02-01 MED ORDER — FUROSEMIDE 40 MG PO TABS
40.0000 mg | ORAL_TABLET | Freq: Every day | ORAL | Status: DC
  Administered 2024-02-02: 40 mg via ORAL
  Filled 2024-02-01: qty 1

## 2024-02-01 MED ORDER — DILTIAZEM LOAD VIA INFUSION
20.0000 mg | Freq: Once | INTRAVENOUS | Status: AC
  Administered 2024-02-01: 20 mg via INTRAVENOUS
  Filled 2024-02-01: qty 20

## 2024-02-01 MED ORDER — ACETAMINOPHEN 650 MG RE SUPP: 650.0000 mg | Freq: Four times a day (QID) | RECTAL | Status: DC | PRN

## 2024-02-01 NOTE — ED Provider Triage Note (Signed)
 Emergency Medicine Provider Triage Evaluation Note  [name] , a 89 y.o. male  was evaluated in triage.  Pt complains of shortness of breath for 1 day   Review of Systems  Positive: SOB Negative: Fever   Physical Exam  BP (!) 146/73 (BP Location: Right Arm)   Pulse (!) 117   Temp 97.8 F (36.6 C) (Oral)   Resp 19   Ht 5' 6 (1.676 m)   Wt 76.7 kg   SpO2 100%   BMI 27.28 kg/m  Gen:   Awake, no distress   Resp:  Normal effort  MSK:   Moves extremities without difficulty  Other:  No rash   Medical Decision Making  Medically screening exam initiated at 6:23 AM.  Appropriate orders placed.  [name] was informed that the remainder of the evaluation will be completed by another provider, this initial triage assessment does not replace that evaluation, and the importance of remaining in the ED until their evaluation is complete.     Lashawnda Hancox, MD 02/01/24 [phone]

## 2024-02-01 NOTE — H&P (Cosign Needed Addendum)
 History and Physical    Patient: [name] FMW:[id number] DOB: [birth year] DOA: 02/01/2024 DOS: the patient was seen and examined on 02/01/2024 PCP: Jodie Lavern CROME, MD  Patient coming from: Home- lives w/ wife  Chief Complaint:  Chief Complaint  Patient presents with   Shortness of Breath   HPI: [name] is a 89 y.o. male with medical history significant of chronic A-fib on Eliquis , history of CAD with CABG in 1990 and PCI in 2016, hypertension, dyslipidemia, prior CVA, diabetes mellitus, BPH.  Patient was in his usual state of health prior to onset of current symptoms.  He was recently treated in early November by his PCP for acute bronchitis with interstitial pneumonitis.  He has completed his prednisone  taper and a Zithromax .  Patient reports that 2 days ago he was having some shortness of breath and utilized a new prescription for albuterol  which he had never taken previously.  He noticed that his shortness of breath actually worsened but then later improved.  He utilized the albuterol  MDI again last night and had worsening of shortness of breath that never resolved.  Patient reports that his A-fib symptoms are typically shortness of breath and he has no awareness of tachypalpitations.  EMS was called to the home and upon their arrival he had O2 sats of 90%.  He was placed on nasal cannula oxygen with improvement in sats to the 93 to 98% range.  He was normotensive but heart rate was in the 110-120 range.  Chest x-ray was obtained that did not show any evidence of heart failure physiology but did showed stable patchy bilateral infrahilar opacities.  proBNP was slightly elevated at around 2700.  Troponin was elevated at 38 with a flat trend.  Hemoglobin was slightly lower than baseline which appears to be between 8.8 and 9.9 with current hemoglobin 7.8.  Because of his persistent tachycardia in the context of underlying atrial fibrillation he was started on a Cardizem   infusion.  He states he has been taking his Eliquis , oral Cardizem  and metoprolol  as scheduled and has not missed any doses.  He denies constitutional symptoms such as fevers chills myalgias, productive cough.  He has not had any chest pain and reiterates that his primary symptom of exacerbation of A-fib is shortness of breath.  Does not use oxygen at home and is typically able to sleep on 1-2 pillows.  Last night due to his shortness of breath he had to sleep in a chair.  No apparent history of asthma or COPD documented.  Hospitalist service has been asked to evaluate the patient for admission.   Review of Systems: As mentioned in the history of present illness. All other systems reviewed and are negative.   Past Medical History:  Diagnosis Date   Abdominal pain    Acute renal failure     resolved   Aortic stenosis    Arthritis    hands & legs (11/'10/2014)   Ataxia    Bladder outlet obstruction    BPH (benign prostatic hyperplasia)    CAD (coronary artery disease)    a. CABG IN 1989. b. 01/08/2015 CTO of ost LAD, LIMA to LAD not visualized but assumed patent given myoview  finding, occluded SVG to diagonal, 99% mid RCA tx w/ SYNERGY DES 3X28 mm   Chronic heart failure with preserved ejection fraction (HFpEF) (HCC)    Constipation    Diabetes mellitus type 2, insulin  dependent (HCC)    Epistaxis, recurrent 04/2015   GERD (gastroesophageal  reflux disease)    HTN (hypertension)    Hx of bacterial pneumonia    Hyperlipemia    Kidney stones    Mild cognitive impairment 03/05/2021   MMSE 26/30 and 6CIT 9 03/2021 AWV   Permanent atrial fibrillation (HCC)    Pneumonia 04/2019   Rib fractures    left rib fractures being treated with pain medications   Thoracic aortic aneurysm    Urinary tract infection     Enterococcus   Past Surgical History:  Procedure Laterality Date   CARDIAC CATHETERIZATION  1989   CARDIAC CATHETERIZATION N/A 01/08/2015   Procedure: Left Heart Cath and  Cors/Grafts Angiography;  Surgeon: Peter M Jordan, MD;  Location: [facility] INVASIVE CV LAB;  Service: Cardiovascular;  Laterality: N/A;   CARDIAC CATHETERIZATION  01/08/2015   Procedure: Coronary Stent Intervention;  Surgeon: Peter M Jordan, MD;  Location[facility] INVASIVE CV LAB;  Service: Cardiovascular;;   CARPAL TUNNEL RELEASE Right 08/2010   CATARACT EXTRACTION W/ INTRAOCULAR LENS  IMPLANT, BILATERAL Bilateral    COLONOSCOPY WITH PROPOFOL  N/A 05/05/2022   Procedure: COLONOSCOPY WITH PROPOFOL ;  Surgeon: Abran Norleen SAILOR, MD;  Location: [facility];  Service: Gastroenterology;  Laterality: N/A;   CORONARY ANGIOPLASTY  01/08/15   RCA DES   CORONARY ARTERY BYPASS GRAFT  1989   CABG X 2   ESOPHAGOGASTRODUODENOSCOPY (EGD) WITH PROPOFOL  N/A 05/05/2022   Procedure: ESOPHAGOGASTRODUODENOSCOPY (EGD) WITH PROPOFOL ;  Surgeon: Abran Norleen SAILOR, MD;  Location: [facility];  Service: Gastroenterology;  Laterality: N/A;   IR THORACENTESIS ASP PLEURAL SPACE W/IMG GUIDE  05/14/2019   JOINT REPLACEMENT     POLYPECTOMY  05/05/2022   Procedure: POLYPECTOMY;  Surgeon: Abran Norleen SAILOR, MD;  Location: [facility];  Service: Gastroenterology;;   TONSILLECTOMY     TOTAL HIP ARTHROPLASTY Right 2000   Social History:  reports that he quit smoking about 65 years ago. His smoking use included cigarettes. He started smoking about 75 years ago. He has a 30 pack-year smoking history. He has never used smokeless tobacco. He reports that he does not drink alcohol and does not use drugs.  Allergies  Allergen Reactions   Septra  [Sulfamethoxazole -Trimethoprim ] Itching   Cefadroxil Other (See Comments)    Unknown reaction    Ciprofloxacin Other (See Comments)    Unknown action     Erythromycin Other (See Comments)    Upsets the stomach     Family History  Problem Relation Age of Onset   Brain cancer Father    Throat cancer Brother    Liver disease Neg Hx    Colon cancer Neg Hx    Esophageal cancer Neg Hx     Prior to Admission  medications   Medication Sig Start Date End Date Taking? Authorizing Provider  albuterol  (VENTOLIN  HFA) 108 (90 Base) MCG/ACT inhaler Inhale 2 puffs into the lungs every 6 (six) hours as needed for wheezing or shortness of breath. 10/24/23   Juvenal Harlene PENNER, DO  apixaban  (ELIQUIS ) 5 MG TABS tablet Take 1 tablet (5 mg total) by mouth 2 (two) times daily. 05/09/22   Sherrill Cable Latif, DO  atorvastatin  (LIPITOR) 40 MG tablet Take 1 tablet (40 mg total) by mouth at bedtime. 08/16/22   Jodie Lavern CROME, MD  azithromycin  (ZITHROMAX ) 250 MG tablet Take 2 tabs today, then 1 tab daily for 4 days 01/02/24   Jodie Lavern CROME, MD  Cholecalciferol  50 MCG (2000 UT) TABS Take 1 tablet (2,000 Units total) by mouth daily. 08/02/23   Jodie,  Lavern CROME, MD  diltiazem  (CARDIZEM  CD) 120 MG 24 hr capsule Take 1 capsule (120 mg total) by mouth daily. 06/06/23   Jodie Lavern CROME, MD  dutasteride  (AVODART ) 0.5 MG capsule Take 1 capsule (0.5 mg total) by mouth daily. 08/30/23   Jodie Lavern CROME, MD  furosemide  (LASIX ) 40 MG tablet TAKE 1 TABLET(40 MG) BY MOUTH DAILY 09/14/23   Jordan, Peter M, MD  gabapentin  (NEURONTIN ) 300 MG capsule Take 2 capsules (600 mg total) by mouth at bedtime. 04/11/23   Jodie Lavern CROME, MD  insulin  glargine (LANTUS ) 100 UNIT/ML injection Inject 0.28 mLs (28 Units total) into the skin every morning. 10/24/23   Vann, Jessica U, DO  iron  polysaccharides (POLY-IRON  150) 150 MG capsule Take 150 mg by mouth daily.    [provider]  Magnesium  400 MG CAPS Take daily 10/24/23   Vann, Jessica U, DO  meclizine  (ANTIVERT ) 25 MG tablet Take 0.5-1 tablets (12.5-25 mg total) by mouth 2 (two) times daily as needed for dizziness. 08/30/23   Jodie Lavern CROME, MD  meloxicam  (MOBIC ) 7.5 MG tablet Take 1 tablet (7.5 mg total) by mouth daily as needed. 08/03/23   Jodie Lavern CROME, MD  metFORMIN  (GLUCOPHAGE ) 1000 MG tablet Take 1 tablet (1,000 mg total) by mouth 2 (two) times daily with a meal. 04/29/23   Jodie Lavern CROME, MD   metoprolol  succinate (TOPROL -XL) 100 MG 24 hr tablet Take 50 mg by mouth 2 (two) times daily. 05/30/23   [provider]  pantoprazole  (PROTONIX ) 40 MG tablet TAKE 1 TABLET BY MOUTH DAILY AT NOON 07/04/23   Daneen Damien BROCKS, NP  predniSONE  (DELTASONE ) 10 MG tablet Take 4 tabs qd x 2 days, 3 qd x 2 days, 2 qd x 2d, 1qd x 3 days 01/02/24   Jodie Lavern CROME, MD  tamsulosin  (FLOMAX ) 0.4 MG CAPS capsule Take 1 capsule (0.4 mg total) by mouth daily. 04/29/23   Jodie Lavern CROME, MD  traMADol  (ULTRAM ) 50 MG tablet Take 1 tablet (50 mg total) by mouth 2 (two) times daily as needed for moderate pain (pain score 4-6). 07/01/23   Jodie Lavern CROME, MD    Physical Exam: Vitals:   02/01/24 1015 02/01/24 1020 02/01/24 1025 02/01/24 1026  BP: 134/67     Pulse: (!) 118 (!) 122 (!) 117   Resp: (!) 26 (!) 21 (!) 24   Temp:    97.9 F (36.6 C)  TempSrc:    Oral  SpO2: 96% 96% 95%   Weight:      Height:       Constitutional: NAD, calm, comfortable; nice jocular gentleman in good spirits Respiratory: Diffuse coarse lung sounds with an occasional expiratory wheeze.  Normal respiratory effort. No accessory muscle use.  2 L oxygen Cardiovascular: Irregular rate and rhythm, no murmurs / rubs / gallops. No extremity edema. 2+ pedal pulses.  No JVD Abdomen: no tenderness, no masses palpated. No hepatosplenomegaly. Bowel sounds positive.  Musculoskeletal: no clubbing / cyanosis. No joint deformity upper and lower extremities. Good ROM, no contractures. Normal muscle tone.  Skin: no rashes, lesions, ulcers. No induration Neurologic: CN 2-12 grossly intact. Sensation intact, Strength 5/5 x all 4 extremities.  Psychiatric: Normal judgment and insight. Alert and oriented x 3. Normal mood.     Data Reviewed:  Sodium 143, potassium 3.6, chloride 106, CO2 27, glucose 157, BUN 20, creatinine 1.08, GFR greater than 60  proBNP 2747, troponin 38 x 2  WBC 11,700 with normal differential, hemoglobin 7.8,  MCV 79.1, platelets  390,000  PCR for influenza, RSV and COVID-negative  Chest x-ray as above  Assessment and Plan: Chronic/permanent A-fib with RVR Patient presents with shortness of breath which is a typical symptom for this gentleman when he is experiencing RVR per his report Has been started on a Cardizem  infusion.  Home oral Cardizem  on hold but have resumed home beta-blocker; if tachycardia persists will need to consult cardiology.  Patient states has been managed medically and has never undergone cardioversion. Continue Eliquis  Hopefully can transition to oral Cardizem  tomorrow Patient reports some shortness of breath prior to utilization of albuterol  MDI.  This was the first time he had ever used this medication.  He noted shortness of breath worsened after use of this medication.  About 18 to 24 hours later had severe shortness of breath again and once again attempted to use the inhaler but this time he had no improvement in symptoms.  Suspect albuterol  was precipitating tachycardia therefore will not utilize Keep potassium greater than 4.0 and magnesium  normal  Acute hypomagnesemia Magnesium  1.3-will give 4 g IV bolus and continue home magnesium  oral dose  Acute hypoxemic respiratory failure Mild exacerbation of HFpEF Moderate to severe aortic stenosis Clinical exam not consistent with severe volume overload. He has received a one-time dose of IV Lasix  in the ED.  For now we will resume home oral dose on 12/4 Would like to avoid overdiuresis given current tachycardia and underlying aortic stenosis Daily weights and strict intake and output Continue beta-blocker Echocardiogram in March 2025 with an EF of 50 to 55% AS is mild by gradient but severe by AVA and DI.  The stroke-volume was inaccurate but could not exclude low-flow gradient severe AS. Utilize Xopenex  if bronchodilator necessary  Microcytic anemia Check anemia panel Could be a degree of dilution Hemoglobin has been slowly trending  downward so we will also check fecal occult blood since patient is on Eliquis  No mention of frank blood or dark stools Continue preadmission iron  pills  Diabetes mellitus 2 Continue insulin  glargine, hold metformin  Follow CBGs and provide SSI Continue Neurontin  and tramadol  for neuropathic pain  Hypertension Continue antihypertensives and diuretics as above  History of CAD and prior CABG Currently asymptomatic Minimal elevation in troponin in context of RVR and mild heart failure exacerbation.  This is consistent with demand ischemia  Dyslipidemia Continue statin  History of CVA Continue Eliquis  and statin  BPH Continue Avodart  and Flomax    Advance Care Planning:   Code Status: Full Code   VTE prophylaxis: Eliquis   Consults: None  Family Communication: Family at bedside  Severity of Illness: The appropriate patient status for this patient is OBSERVATION. Observation status is judged to be reasonable and necessary in order to provide the required intensity of service to ensure the patient's safety. The patient's presenting symptoms, physical exam findings, and initial radiographic and laboratory data in the context of their medical condition is felt to place them at decreased risk for further clinical deterioration. Furthermore, it is anticipated that the patient will be medically stable for discharge from the hospital within 2 midnights of admission.   Author: Isaiah Lever, NP 02/01/2024 10:54 AM  For on call review [email or web address].

## 2024-02-01 NOTE — ED Provider Notes (Signed)
 I provided a substantive portion of the care of this patient.  I personally made/approved the management plan for this patient and take responsibility for the patient management.  EKG Interpretation Date/Time:  Wednesday February 01 2024 06:11:10 EST Ventricular Rate:  104 PR Interval:    QRS Duration:  90 QT Interval:  345 QTC Calculation: 454 R Axis:   84  Text Interpretation: Atrial fibrillation Ventricular premature complex Borderline right axis deviation Confirmed by Palumbo, April ([id number]) on 02/01/2024 6:38:13 AM   Normal 89-year-old male presents with shortness of breath.  His EKG shows A-fib.  The rate is increased and we will start Cardizem  drip.  He is on blood thinners for this.  BNP elevated.  Concern that he has CHF.  Will start on IV Lasix  and admit to the hospitalist service   Dasie Faden, MD 02/01/24 [phone]

## 2024-02-01 NOTE — ED Provider Notes (Signed)
 [place] [facility]MENT AT [facility] Provider Note   CSN: [id number] Arrival date & time: 02/01/24  0559     Patient presents with: Shortness of Breath   [name] is a 89 y.o. male with past medical history of aortic stenosis, mild cognitive impairment, hypertension, diabetes, hyperlipidemia, chronic A-fib, prior CVA, GERD, CAD, history of CABG, CKD, who presents emergency department for evaluation of shortness of breath.  Patient reports yesterday he did have some shortness of breath at home.  He has an inhaler at home, which he states that he used for relief.  At this time, patient is denying any shortness of breath, chest pain, nausea,, vomiting.  Patient reports he is without complaint at this time.     Shortness of Breath      Prior to Admission medications   Medication Sig Start Date End Date Taking? Authorizing Provider  albuterol  (VENTOLIN  HFA) 108 (90 Base) MCG/ACT inhaler Inhale 2 puffs into the lungs every 6 (six) hours as needed for wheezing or shortness of breath. 10/24/23   Juvenal Harlene PENNER, DO  apixaban  (ELIQUIS ) 5 MG TABS tablet Take 1 tablet (5 mg total) by mouth 2 (two) times daily. 05/09/22   Sherrill Cable Latif, DO  atorvastatin  (LIPITOR) 40 MG tablet Take 1 tablet (40 mg total) by mouth at bedtime. 08/16/22   Jodie Lavern CROME, MD  azithromycin  (ZITHROMAX ) 250 MG tablet Take 2 tabs today, then 1 tab daily for 4 days 01/02/24   Jodie Lavern CROME, MD  Cholecalciferol  50 MCG (2000 UT) TABS Take 1 tablet (2,000 Units total) by mouth daily. 08/02/23   Jodie Lavern CROME, MD  diltiazem  (CARDIZEM  CD) 120 MG 24 hr capsule Take 1 capsule (120 mg total) by mouth daily. 06/06/23   Jodie Lavern CROME, MD  dutasteride  (AVODART ) 0.5 MG capsule Take 1 capsule (0.5 mg total) by mouth daily. 08/30/23   Jodie Lavern CROME, MD  furosemide  (LASIX ) 40 MG tablet TAKE 1 TABLET(40 MG) BY MOUTH DAILY 09/14/23   Jordan, Peter M, MD  gabapentin  (NEURONTIN ) 300 MG capsule Take 2 capsules  (600 mg total) by mouth at bedtime. 04/11/23   Jodie Lavern CROME, MD  insulin  glargine (LANTUS ) 100 UNIT/ML injection Inject 0.28 mLs (28 Units total) into the skin every morning. 10/24/23   Vann, Jessica U, DO  iron  polysaccharides (POLY-IRON  150) 150 MG capsule Take 150 mg by mouth daily.    [provider]  Magnesium  400 MG CAPS Take daily 10/24/23   Vann, Jessica U, DO  meclizine  (ANTIVERT ) 25 MG tablet Take 0.5-1 tablets (12.5-25 mg total) by mouth 2 (two) times daily as needed for dizziness. 08/30/23   Jodie Lavern CROME, MD  meloxicam  (MOBIC ) 7.5 MG tablet Take 1 tablet (7.5 mg total) by mouth daily as needed. 08/03/23   Jodie Lavern CROME, MD  metFORMIN  (GLUCOPHAGE ) 1000 MG tablet Take 1 tablet (1,000 mg total) by mouth 2 (two) times daily with a meal. 04/29/23   Jodie Lavern CROME, MD  metoprolol  succinate (TOPROL -XL) 100 MG 24 hr tablet Take 50 mg by mouth 2 (two) times daily. 05/30/23   [provider]  pantoprazole  (PROTONIX ) 40 MG tablet TAKE 1 TABLET BY MOUTH DAILY AT NOON 07/04/23   Daneen Damien BROCKS, NP  predniSONE  (DELTASONE ) 10 MG tablet Take 4 tabs qd x 2 days, 3 qd x 2 days, 2 qd x 2d, 1qd x 3 days 01/02/24   Jodie Lavern CROME, MD  tamsulosin  (FLOMAX ) 0.4 MG CAPS capsule  Take 1 capsule (0.4 mg total) by mouth daily. 04/29/23   Jodie Lavern CROME, MD  traMADol  (ULTRAM ) 50 MG tablet Take 1 tablet (50 mg total) by mouth 2 (two) times daily as needed for moderate pain (pain score 4-6). 07/01/23   Jodie Lavern CROME, MD    Allergies: Septra  [sulfamethoxazole -trimethoprim ], Cefadroxil, Ciprofloxacin, and Erythromycin    Review of Systems  Respiratory:  Positive for shortness of breath.     Updated Vital Signs BP 130/76   Pulse (!) 154   Temp 97.9 F (36.6 C)   Resp 19   Ht 5' 6 (1.676 m)   Wt 76.7 kg   SpO2 96%   BMI 27.28 kg/m   Physical Exam Vitals and nursing note reviewed.  Constitutional:      Appearance: Normal appearance.  HENT:     Head: Normocephalic and atraumatic.      Mouth/Throat:     Mouth: Mucous membranes are moist.  Eyes:     General: No scleral icterus.       Right eye: No discharge.        Left eye: No discharge.     Conjunctiva/sclera: Conjunctivae normal.  Cardiovascular:     Rate and Rhythm: Normal rate and regular rhythm.     Pulses: Normal pulses.  Pulmonary:     Effort: Pulmonary effort is normal.     Breath sounds: Wheezing present.     Comments: Patient with expiratory wheezing noted Abdominal:     General: There is no distension.     Tenderness: There is no abdominal tenderness.  Musculoskeletal:        General: No deformity.     Cervical back: Normal range of motion.  Skin:    General: Skin is warm and dry.     Capillary Refill: Capillary refill takes less than 2 seconds.  Neurological:     Mental Status: He is alert.     Motor: No weakness.  Psychiatric:        Mood and Affect: Mood normal.     (all labs ordered are listed, but only abnormal results are displayed) Labs Reviewed  CBC WITH DIFFERENTIAL/PLATELET - Abnormal; Notable for the following components:      Result Value   WBC 11.7 (*)    RBC 3.40 (*)    Hemoglobin 7.8 (*)    HCT 26.9 (*)    MCV 79.1 (*)    MCH 22.9 (*)    MCHC 29.0 (*)    RDW 18.4 (*)    Neutro Abs 8.0 (*)    All other components within normal limits  BASIC METABOLIC PANEL WITH GFR - Abnormal; Notable for the following components:   Glucose, Bld 157 (*)    All other components within normal limits  PRO BRAIN NATRIURETIC PEPTIDE - Abnormal; Notable for the following components:   Pro Brain Natriuretic Peptide 2,747.0 (*)    All other components within normal limits  TROPONIN T, HIGH SENSITIVITY - Abnormal; Notable for the following components:   Troponin T High Sensitivity 38 (*)    All other components within normal limits  TROPONIN T, HIGH SENSITIVITY - Abnormal; Notable for the following components:   Troponin T High Sensitivity 38 (*)    All other components within normal limits   RESP PANEL BY RT-PCR (RSV, FLU A&B, COVID)  [id number]    EKG: EKG Interpretation Date/Time:  Wednesday February 01 2024 06:11:10 EST Ventricular Rate:  104 PR Interval:    QRS Duration:  90 QT Interval:  345 QTC Calculation: 454 R Axis:   84  Text Interpretation: Atrial fibrillation Ventricular premature complex Borderline right axis deviation Confirmed by Nettie, April ([id number]) on 02/01/2024 6:38:13 AM  Radiology: [facility] Chest Portable 1 View Result Date: 02/01/2024 EXAM: 1 VIEW(S) XRAY OF THE CHEST 02/01/2024 06:41:00 AM COMPARISON: 12/21/2023 CLINICAL HISTORY: 89 year old male with SOB, etc. FINDINGS: LINES, TUBES AND DEVICES: Vascular clips in mediastinum. Intact sternotomy wires. LUNGS AND PLEURA: Bibasilar interstitial markings. Patchy and streaky bilateral infrahilar opacity has not significantly changed. No significant pleural effusion. No pneumothorax. HEART AND MEDIASTINUM: Cardiomegaly. Aortic calcification. BONES AND SOFT TISSUES: No acute osseous abnormality. IMPRESSION: 1. Patchy and streaky bilateral infrahilar opacity has not significantly changed. 2. No new cardiopulmonary abnormality. Electronically signed by: Helayne Hurst MD 02/01/2024 06:47 AM EST RP Workstation: [id number]     .Critical Care  Performed by: Torrence Marry RAMAN, [name] Authorized by: Torrence Marry RAMAN, [name]   Critical care provider statement:    Critical care time (minutes):  40   Critical care was necessary to treat or prevent imminent or life-threatening deterioration of the following conditions:  Cardiac failure and respiratory failure   Critical care was time spent personally by me on the following activities:  Blood draw for specimens, development of treatment plan with patient or surrogate, discussions with consultants, discussions with primary provider, evaluation of patient's response to treatment, examination of patient, interpretation of cardiac output measurements, obtaining history from patient or  surrogate, review of old charts, re-evaluation of patient's condition, pulse oximetry, ordering and review of laboratory studies, ordering and performing treatments and interventions and ordering and review of radiographic studies   I assumed direction of critical care for this patient from another provider in my specialty: no     Care discussed with: admitting provider      Medications Ordered in the ED  diltiazem  (CARDIZEM ) 1 mg/mL load via infusion 20 mg (20 mg Intravenous Bolus from Bag 02/01/24 0808)    And  diltiazem  (CARDIZEM ) 125 mg in dextrose  5% 125 mL (1 mg/mL) infusion (12.5 mg/hr Intravenous Rate/Dose Change 02/01/24 0922)  furosemide  (LASIX ) injection 40 mg (40 mg Intravenous Given 02/01/24 0841)                                  Medical Decision Making Amount and/or Complexity of Data Reviewed Labs: ordered. Radiology: ordered.  Risk Prescription drug management. Decision regarding hospitalization.   This patient presents to the ED for concern of shortness of breath, this involves an extensive number of treatment options, and is a complaint that carries with it a high risk of complications and morbidity.   Differential diagnosis includes: Heart failure exacerbation, COPD exacerbation, pneumonia, flu, COVID, PE, A-fib with RVR  Co morbidities:  chronic A-fib, heart failure, aortic stenosis, hypertension, diabetes, CAD, CVA, GERD, CKD stage III AA   Lab Tests:  I Ordered, and personally interpreted labs.  The pertinent results include:    - WBC: 11.7 - Initial troponin: 38 - proBNP: 2,747  Imaging Studies:  I ordered imaging studies including chest x-ray I independently visualized and interpreted imaging which showed   1. Patchy and streaky bilateral infrahilar opacity has not significantly  changed.  2. No new cardiopulmonary abnormality.   I agree with the radiologist interpretation  Cardiac Monitoring/ECG:  The patient was maintained on a cardiac  monitor.  I personally viewed and interpreted the cardiac monitored which  showed an underlying rhythm of: Atrial fibrillation  Medicines ordered and prescription drug management:  I ordered medication including  Medications  diltiazem  (CARDIZEM ) 1 mg/mL load via infusion 20 mg (20 mg Intravenous Bolus from Bag 02/01/24 0808)    And  diltiazem  (CARDIZEM ) 125 mg in dextrose  5% 125 mL (1 mg/mL) infusion (12.5 mg/hr Intravenous Rate/Dose Change 02/01/24 0922)  furosemide  (LASIX ) injection 40 mg (40 mg Intravenous Given 02/01/24 0841)   for A-fib with RVR and heart failure exacerbation Reevaluation of the patient after these medicines showed that the patient improved I have reviewed the patients home medicines and have made adjustments as needed  Test Considered:   none  Critical Interventions:   atrial fibrillation with RVR - aggressive rhythm control - multi system involvement  Consultations Obtained: -hospitalist  Problem List / ED Course:     ICD-10-CM   1. Acute on chronic heart failure, unspecified heart failure type (HCC)  I50.9     2. Atrial fibrillation with RVR (HCC)  I48.91       MDM: 89 year old male who presents emergency department for evaluation of shortness of breath.  Patient initially stating shortness of breath started yesterday, but on my initial assessment he was not complaining of any shortness of breath or chest pain.  He received a DuoNeb from EMS.  Patient has a known history of chronic A-fib and his heart rate was 117 upon arrival and during my initial assessment.  He is anticoagulated with Eliquis  and diltiazem .  On physical exam, patient remains tachycardic at 117 and his breath sounds are diminished bilaterally.  He does have a history of CHF as well as prior GI bleed.  Patient's lab work shows leukocytosis of 11.7, hemoglobin of 7.8, which is near his baseline.SABRA  His initial troponin is 38.  Delta troponin is pending.  Respiratory panel is negative.   Leukocytosis is likely reactive.  BNP is 2747.Chest x-ray shows no acute cardiopulmonary process or evidence of pleural effusion.  Upon reassessment, patient's heart rate is now 142 and he is complaining of chest pain per RN.  This patient has been seen in conjunction with Dr. Dasie, and we have decided to start patient on Cardizem  drip as well as IV Lasix .  At this time, patient will require admission for heart failure exacerbation as well as A-fib now with RVR.  Etiology is unclear as to whether the A-fib with RVR caused the strain on the patient's heart or if these are separate.  Consult to hospitalist has been placed.  08:45 At this time, patient's heart rate is 139.  Patient has been given his initial dose of IV Lasix  and a Cardizem  push.  Cardizem  drip is currently running at this time.  I did speak with Dr. Roxane who will accept this patient.   Dispostion:  After consideration of the diagnostic results and the patients response to treatment, I feel that the patient would benefit from hospital admission for heart rate control and heart failure exacerbations management.    Final diagnoses:  Acute on chronic heart failure, unspecified heart failure type [facility])  Atrial fibrillation with RVR [facility])    ED Discharge Orders     None          Torrence Marry GORMAN DEVONNA 02/01/24 9071    Dasie Faden, MD 02/02/24 [phone]

## 2024-02-01 NOTE — ED Triage Notes (Signed)
 PT BIB EMS coming from home, c/o shob that started yesterday morning. Woke up today unable to catch his breath. Hx, Afib, DM2  EMS: 152/77, Spo2 90% RA, 18g RAC- 125 solumedrol en route, 2nd breathing treatment, 1 atrovent and DuoNeb.

## 2024-02-01 NOTE — Progress Notes (Signed)
 Heart Failure Navigator Progress Note  Assessed for Heart & Vascular TOC clinic readiness.  Patient does not meet criteria due to EF 50-55%, has a scheduled [facility] appointment on 02/07/2024. No HF TOC. .   Navigator will sign off at this time.   Stephane Haddock, BSN, Scientist, Clinical (histocompatibility And Immunogenetics) Only

## 2024-02-02 DIAGNOSIS — D509 Iron deficiency anemia, unspecified: Secondary | ICD-10-CM

## 2024-02-02 DIAGNOSIS — D638 Anemia in other chronic diseases classified elsewhere: Secondary | ICD-10-CM

## 2024-02-02 DIAGNOSIS — J9601 Acute respiratory failure with hypoxia: Secondary | ICD-10-CM | POA: Diagnosis not present

## 2024-02-02 DIAGNOSIS — I4891 Unspecified atrial fibrillation: Secondary | ICD-10-CM | POA: Diagnosis not present

## 2024-02-02 LAB — BASIC METABOLIC PANEL WITH GFR
Anion gap: 11 (ref 5–15)
BUN: 37 mg/dL — ABNORMAL HIGH (ref 8–23)
CO2: 24 mmol/L (ref 22–32)
Calcium: 9.2 mg/dL (ref 8.9–10.3)
Chloride: 102 mmol/L (ref 98–111)
Creatinine, Ser: 1.29 mg/dL — ABNORMAL HIGH (ref 0.61–1.24)
GFR, Estimated: 52 mL/min — ABNORMAL LOW (ref >60)
Glucose, Bld: 302 mg/dL — ABNORMAL HIGH (ref 70–99)
Potassium: 4.6 mmol/L (ref 3.5–5.1)
Sodium: 138 mmol/L (ref 135–145)

## 2024-02-02 LAB — CBC
HCT: 25.6 % — ABNORMAL LOW (ref 39.0–52.0)
Hemoglobin: 7.4 g/dL — ABNORMAL LOW (ref 13.0–17.0)
MCH: 22.3 pg — ABNORMAL LOW (ref 26.0–34.0)
MCHC: 28.9 g/dL — ABNORMAL LOW (ref 30.0–36.0)
MCV: 77.1 fL — ABNORMAL LOW (ref 80.0–100.0)
Platelets: 415 K/uL — ABNORMAL HIGH (ref 150–400)
RBC: 3.32 MIL/uL — ABNORMAL LOW (ref 4.22–5.81)
RDW: 18.4 % — ABNORMAL HIGH (ref 11.5–15.5)
WBC: 11.5 K/uL — ABNORMAL HIGH (ref 4.0–10.5)
nRBC: 0 % (ref 0.0–0.2)

## 2024-02-02 LAB — GLUCOSE, CAPILLARY
Glucose-Capillary: 281 mg/dL — ABNORMAL HIGH (ref 70–99)
Glucose-Capillary: 321 mg/dL — ABNORMAL HIGH (ref 70–99)
Glucose-Capillary: 175 mg/dL — ABNORMAL HIGH (ref 70–99)

## 2024-02-02 LAB — HEMOGLOBIN AND HEMATOCRIT, BLOOD
HCT: 29.8 % — ABNORMAL LOW (ref 39.0–52.0)
Hemoglobin: 9 g/dL — ABNORMAL LOW (ref 13.0–17.0)

## 2024-02-02 LAB — PREPARE RBC (CROSSMATCH)

## 2024-02-02 MED ORDER — DILTIAZEM HCL ER COATED BEADS 120 MG PO CP24
120.0000 mg | ORAL_CAPSULE | Freq: Every day | ORAL | Status: DC
  Administered 2024-02-02: 120 mg via ORAL
  Filled 2024-02-02: qty 1

## 2024-02-02 MED ORDER — SODIUM CHLORIDE 0.9% IV SOLUTION: Freq: Once | INTRAVENOUS | Status: AC

## 2024-02-02 MED ORDER — METOPROLOL TARTRATE 50 MG PO TABS
50.0000 mg | ORAL_TABLET | Freq: Two times a day (BID) | ORAL | Status: DC
  Administered 2024-02-02: 50 mg via ORAL
  Filled 2024-02-02: qty 1

## 2024-02-02 NOTE — Care Management Obs Status (Signed)
 MEDICARE OBSERVATION STATUS NOTIFICATION   Patient Details  Name: [name] MRN: [id number] Date of Birth: 11-14-1928   Medicare Observation Status Notification Given:       [facility] Service, RN 02/02/2024, 1:49 PM

## 2024-02-02 NOTE — Evaluation (Signed)
 Physical Therapy Evaluation Only Patient Details Name: [name] MRN: [id number] DOB: 08-18-1928 Today's Date: 02/02/2024  History of Present Illness  [name] is a 89 y.o. male presents with shortness of breath, Chronic/permanent A-fib with RVR. PMH: chronic A-fib on Eliquis , CAD with CABG in 1990 and PCI in 2016, HTN, dyslipidemia, prior CVA, diabetes mellitus, BPH, mild cognitive impairment, thoracic aortic aneurysm  Clinical Impression  PTA, pt reports modified ind with SPC or no AD in the home, using SPC in community, denies falls, ind with self care, shares household chores with spouse, has multiple family members locally, still drives locally. On eval, pt with good sensation, good BLE strength. Pt conversational throughout, no shortness of breath noted and pt denies consistently, HR up to 130 noted and SpO2 not reading on device while on RA. No acute PT needs identified and no f/u recommendations at discharge.        If plan is discharge home, recommend the following:     Can travel by private vehicle        Equipment Recommendations None recommended by PT  Recommendations for Other Services       Functional Status Assessment Patient has not had a recent decline in their functional status     Precautions / Restrictions Precautions Recall of Precautions/Restrictions: Intact Restrictions Weight Bearing Restrictions Per Provider Order: No      Mobility  Bed Mobility               General bed mobility comments: in recliner upon arrival    Transfers Overall transfer level: Needs assistance Equipment used: Rolling walker (2 wheels) Transfers: Sit to/from Stand Sit to Stand: Supervision, Modified independent (Device/Increase time)           General transfer comment: supv to mod ind with transfers from reclner and toilet    Ambulation/Gait Ambulation/Gait assistance: Supervision, Modified independent (Device/Increase time) Gait  Distance (Feet): 250 Feet   Gait Pattern/deviations: WFL(Within Functional Limits) Gait velocity: WFL     General Gait Details: step through gait pattern, good steadiness with RW, conversational without shortness of breath noted, HR up to 130 with gait, poor wavelength without SpO2 reading  Stairs            Wheelchair Mobility     Tilt Bed    Modified Rankin (Stroke Patients Only)       Balance Overall balance assessment: No apparent balance deficits (not formally assessed)                                           Pertinent Vitals/Pain Pain Assessment Pain Assessment: No/denies pain    Home Living Family/patient expects to be discharged to:: Private residence Living Arrangements: Spouse/significant other Available Help at Discharge: Family;Available 24 hours/day Type of Home: House Home Access: Level entry       Home Layout: One level Home Equipment: Agricultural Consultant (2 wheels);Cane - single point      Prior Function Prior Level of Function : Independent/Modified Independent             Mobility Comments: pt reports amb with SPC in community or uses shopping cart, denies falls ADLs Comments: pt reports wife cooks, pt does dishes, ind with self care     Extremity/Trunk Assessment   Upper Extremity Assessment Upper Extremity Assessment: Defer to OT evaluation    Lower Extremity Assessment  Lower Extremity Assessment: Overall WFL for tasks assessed;RLE deficits/detail;LLE deficits/detail RLE Deficits / Details: AROM WFL, ankle 5/5, knee extension 4+/5, hip flexion 4/5, hip abduction 4/5, hip adduction 4/5 RLE Sensation: WNL RLE Coordination: WNL LLE Deficits / Details: AROM WFL, ankle5/5, knee extension 4+/5, hip flexion 4/5, hip abduction 4/5, hip adduction 4/5 LLE Sensation: WNL LLE Coordination: WNL    Cervical / Trunk Assessment Cervical / Trunk Assessment: Normal  Communication   Communication Communication:  Impaired Factors Affecting Communication: Hearing impaired    Cognition Arousal: Alert Behavior During Therapy: WFL for tasks assessed/performed   PT - Cognitive impairments: No apparent impairments                         Following commands: Intact       Cueing       General Comments      Exercises     Assessment/Plan    PT Assessment Patient does not need any further PT services  PT Problem List         PT Treatment Interventions      PT Goals (Current goals can be found in the Care Plan section)  Acute Rehab PT Goals Patient Stated Goal: return home PT Goal Formulation: All assessment and education complete, DC therapy    Frequency       Co-evaluation               AM-PAC PT 6 Clicks Mobility  Outcome Measure Help needed turning from your back to your side while in a flat bed without using bedrails?: None Help needed moving from lying on your back to sitting on the side of a flat bed without using bedrails?: None Help needed moving to and from a bed to a chair (including a wheelchair)?: None Help needed standing up from a chair using your arms (e.g., wheelchair or bedside chair)?: None Help needed to walk in hospital room?: None Help needed climbing 3-5 steps with a railing? : A Little 6 Click Score: 23    End of Session Equipment Utilized During Treatment: Gait belt Activity Tolerance: Patient tolerated treatment well Patient left: in chair;with call bell/phone within reach;with chair alarm set Nurse Communication: Mobility status PT Visit Diagnosis: Other abnormalities of gait and mobility (R26.89)    Time: 9041-8970 PT Time Calculation (min) (ACUTE ONLY): 31 min   Charges:   PT Evaluation $PT Eval Low Complexity: 1 Low PT Treatments $Gait Training: 8-22 mins PT General Charges $$ ACUTE PT VISIT: 1 Visit         Tori Mallarie Voorhies PT, DPT 02/02/24, 12:54 PM

## 2024-02-02 NOTE — Care Management Obs Status (Signed)
 MEDICARE OBSERVATION STATUS NOTIFICATION   Patient Details  Name: [name] MRN: [id number] Date of Birth: October 15, 1928   Medicare Observation Status Notification Given:  Yes    MahabirNathanel, RN 02/02/2024, 1:49 PM

## 2024-02-02 NOTE — Progress Notes (Signed)
 AVS reviewed w/ patient and wife- both verbalized an understanding. Patient dressed for discharge to home. - to lobby via w/c

## 2024-02-02 NOTE — Progress Notes (Signed)
 Cardizem  gtt rate decreased after HR began sustaining between 60-65 bpm. CCMD alerted a 2.17 second pause, Cardizem  gtt stopped and A. Andrez NP notified. HR has sustained between 65-80 bpm throughout the night after gtt being stopped.

## 2024-02-02 NOTE — Discharge Summary (Signed)
 Physician Discharge Summary  [name] FMW:[id number] DOB: Dec 08, 1928 DOA: 02/01/2024  PCP: Jodie Lavern CROME, MD  Admit date: 02/01/2024 Discharge date: 02/02/2024  Admitted From: Home Disposition: Home  Recommendations for Outpatient Follow-up:  Follow up with PCP in 1-2 weeks Please obtain CBC in one week to recheck hemoglobin  Home Health: No Equipment/Devices: None  Discharge Condition: Stable CODE STATUS: Full code Diet recommendation: Heart healthy/consistent carbohydrate diet  History of present illness:  [name] is a 89 year old male with past medical history significant for chronic atrial fibrillation on Eliquis , CAD s/p PCI/CABG, HTN, HLD, history of CVA, DM2, BPH who presented to [facility] ED from home via EMS on 02/01/2024 with complaints of shortness of breath.  Onset day prior.  Attempted use of albuterol  inhaler with no significant effect.  Recently treated for acute bronchitis with prednisone  taper and azithromycin  by PCP.  Patient reports that his A-fib symptoms are typically shortness of breath when his heart rate is elevated.  He has no awareness of tachycardia or palpitations.  On EMS arrival, patient's oxygen saturation was noted to be 90% and was placed on nasal cannula.  He was normotensive but heart rates in the 110-120 range.  Patient was transported to the ED for further evaluation and management.  In the ED, temperature 97.8 F, HR 120-150s, RR 33, BP 146/73, SpO2 100% on 2 L nasal cannula.  WBC 11.7, hemoglobin 7.8, platelet count 390.  Sodium 143, potassium 3.6, chloride 106, CO2 27, glucose 157, BUN 20, creat 1.08.  BNP 2747.0.  High-sensitivity troponin 38 followed by 38.  COVID/influenza/RSV PCR negative.  Chest x-ray with patchy and streaky bilateral infrahilar opacity has not singly changed, no new cardiopulmonary abnormality.  Received IV Lasix  in the ED.  TRH consulted for admission for further evaluation management of A-fib with RVR,  mild exacerbation of chronic diastolic congestive heart failure.  Hospital course:  Acute hypoxemic respiratory failure, POA Chronic atrial fibrillation with RVR Acute on chronic diastolic congestive heart failure exacerbation Hx Moderate to severe aortic stenosis. Patient presenting with shortness of breath, typical of accelerated heart rate from his underlying A-fib.  Patient was noted to be in A-fib with RVR on arrival with heart rate ranging 120-150s.  Additionally received one-time dose of IV Lasix  in the ED for elevated BNP.  Patient was started on a Cardizem  drip with improvement in heart rate which was titrated off.  Patient was restarted on his home Cardizem  CD 120 mg p.o. daily, continued on his metoprolol  tartrate 50 mg p.o. twice daily with good control of his blood pressure.  Additionally patient was given 1 unit PRBC given anemia, likely contributing factor to his A-fib with RVR.  Review of echocardiogram March 2025 with LVEF 50-55%, AAS reported as mild by gradient but severe by AVA and DI.  Recommend repeat CBC 1 week. Hemoglobin 9.0 at time of discharge.  Outpatient follow-up with PCP/cardiology.   Microcytic anemia Anemia panel with iron  10, TIBC 377, ferritin 55, folate 17.8, B12 606.  Transfused 1 unit PRBC with hemoglobin 9.0 at time of discharge. .  Recommend repeat CBC 1 week.  Hypomagnesemia Repleted.   Diabetes mellitus 2 Continue insulin  glargine, metformin    Hypertension Continue Cardizem  CD 120 mg p.o. daily, metoprolol  tartrate 50 mg p.o. twice daily.   History of CAD and prior CABG Asymptomatic, denies chest pain. Minimal elevation in troponin in context of RVR and mild heart failure exacerbation.  This is consistent with demand ischemia   Dyslipidemia  Continue statin   History of CVA Continue Eliquis  and statin   BPH Continue Avodart  and Flomax   Obesity, class I Body mass index is 30.85 kg/m.     Discharge Diagnoses:  Active Problems:   Atrial  fibrillation with RVR [facility])    Discharge Instructions   Allergies as of 02/02/2024       Reactions   Cefadroxil Other (See Comments)   Unknown reaction    Ciprofloxacin Other (See Comments)   Unknown action    Erythromycin Other (See Comments)   Upsets the stomach   Septra  [sulfamethoxazole -trimethoprim ] Itching        Medication List     STOP taking these medications    azithromycin  250 MG tablet Commonly known as: ZITHROMAX    Cholecalciferol  50 MCG (2000 UT) Tabs   Magnesium  400 MG Caps   metoprolol  succinate 100 MG 24 hr tablet Commonly known as: TOPROL -XL   Poly-Iron  150 150 MG capsule Generic drug: iron  polysaccharides   predniSONE  10 MG tablet Commonly known as: DELTASONE    traMADol  50 MG tablet Commonly known as: ULTRAM        TAKE these medications    albuterol  108 (90 Base) MCG/ACT inhaler Commonly known as: VENTOLIN  HFA Inhale 2 puffs into the lungs every 6 (six) hours as needed for wheezing or shortness of breath.   apixaban  5 MG Tabs tablet Commonly known as: ELIQUIS  Take 1 tablet (5 mg total) by mouth 2 (two) times daily.   atorvastatin  40 MG tablet Commonly known as: LIPITOR Take 1 tablet (40 mg total) by mouth at bedtime.   diltiazem  120 MG 24 hr capsule Commonly known as: CARDIZEM  CD Take 1 capsule (120 mg total) by mouth daily.   dutasteride  0.5 MG capsule Commonly known as: Avodart  Take 1 capsule (0.5 mg total) by mouth daily.   furosemide  40 MG tablet Commonly known as: LASIX  TAKE 1 TABLET(40 MG) BY MOUTH DAILY   gabapentin  300 MG capsule Commonly known as: NEURONTIN  Take 2 capsules (600 mg total) by mouth at bedtime.   insulin  glargine 100 UNIT/ML injection Commonly known as: LANTUS  Inject 0.28 mLs (28 Units total) into the skin every morning. What changed:  how much to take when to take this   meclizine  25 MG tablet Commonly known as: ANTIVERT  Take 0.5-1 tablets (12.5-25 mg total) by mouth 2 (two) times daily as  needed for dizziness.   meloxicam  7.5 MG tablet Commonly known as: MOBIC  Take 1 tablet (7.5 mg total) by mouth daily as needed. What changed: reasons to take this   metFORMIN  1000 MG tablet Commonly known as: GLUCOPHAGE  Take 1 tablet (1,000 mg total) by mouth 2 (two) times daily with a meal.   metoprolol  tartrate 50 MG tablet Commonly known as: LOPRESSOR  Take 50 mg by mouth 2 (two) times daily.   pantoprazole  40 MG tablet Commonly known as: PROTONIX  TAKE 1 TABLET BY MOUTH DAILY AT NOON   tamsulosin  0.4 MG Caps capsule Commonly known as: FLOMAX  Take 1 capsule (0.4 mg total) by mouth daily.        Follow-up Information     Jodie Lavern CROME, MD. Schedule an appointment as soon as possible for a visit in 1 week(s).   Specialty: Family Medicine Contact information: [address] [place] [address] [phone]                Allergies  Allergen Reactions   Cefadroxil Other (See Comments)    Unknown reaction    Ciprofloxacin Other (See  Comments)    Unknown action     Erythromycin Other (See Comments)    Upsets the stomach    Septra  [Sulfamethoxazole -Trimethoprim ] Itching    Consultations: None   Procedures/Studies: DG Chest Portable 1 View Result Date: 02/01/2024 EXAM: 1 VIEW(S) XRAY OF THE CHEST 02/01/2024 06:41:00 AM COMPARISON: 12/21/2023 CLINICAL HISTORY: 89 year old male with SOB, etc. FINDINGS: LINES, TUBES AND DEVICES: Vascular clips in mediastinum. Intact sternotomy wires. LUNGS AND PLEURA: Bibasilar interstitial markings. Patchy and streaky bilateral infrahilar opacity has not significantly changed. No significant pleural effusion. No pneumothorax. HEART AND MEDIASTINUM: Cardiomegaly. Aortic calcification. BONES AND SOFT TISSUES: No acute osseous abnormality. IMPRESSION: 1. Patchy and streaky bilateral infrahilar opacity has not significantly changed. 2. No new cardiopulmonary abnormality. Electronically signed by: Helayne Hurst MD 02/01/2024  06:47 AM EST RP Workstation: [id number]     Subjective: Patient seen examined bedside, lying in bed.  No complaints this morning.  Feels back to baseline.  Heart rate remains stable, titrated off of Cardizem  drip back to long-acting Cardizem .  Remains on metoprolol  tartrate twice daily.  Transfuse 1 unit PRBC for anemia likely contributing factor to his symptoms.  Ready for discharge home.  No other questions or concerns at this time.  Denies headache, no dizziness, no chest pain, no palpitations, no shortness of breath, no abdominal pain, no fever/chills/night sweats, no nausea/emesis diarrhea, no focal weakness, no fatigue, no paresthesias.  No acute events overnight per nursing.  Discharge Exam: Vitals:   02/02/24 1527 02/02/24 1528  BP: 135/68 135/68  Pulse: 78 63  Resp: 20   Temp: 97.7 F (36.5 C) 97.7 F (36.5 C)  SpO2: 100% 100%   Vitals:   02/02/24 1213 02/02/24 1322 02/02/24 1527 02/02/24 1528  BP: 130/68 121/60 135/68 135/68  Pulse: 84 89 78 63  Resp:  18 20   Temp: 97.6 F (36.4 C) 97.7 F (36.5 C) 97.7 F (36.5 C) 97.7 F (36.5 C)  TempSrc: Oral Oral Oral Oral  SpO2: 99% 100% 100% 100%  Weight:      Height:        Physical Exam: GEN: NAD, alert and oriented x 3, obese HEENT: NCAT, PERRL, EOMI, sclera clear, MMM PULM: CTAB w/o wheezes/crackles, normal respiratory effort, on room air with SpO2 99% at rest CV: Irregular irregular rhythm, normal rate w/o M/G/R GI: abd soft, NTND, + BS MSK: no peripheral edema, muscle strength globally intact 5/5 bilateral upper/lower extremities NEURO: CN II-XII intact, no focal deficits, sensation to light touch intact PSYCH: normal mood/affect Integumentary: dry/intact, no rashes or wounds    The results of significant diagnostics from this hospitalization (including imaging, microbiology, ancillary and laboratory) are listed below for reference.     Microbiology: Recent Results (from the past 240 hours)  Resp panel by  RT-PCR (RSV, Flu A&B, Covid) Anterior Nasal Swab     Status: None   Collection Time: 02/01/24  6:30 AM   Specimen: Anterior Nasal Swab  Result Value Ref Range Status   SARS Coronavirus 2 by RT PCR NEGATIVE NEGATIVE Final    Comment: (NOTE) SARS-CoV-2 target nucleic acids are NOT DETECTED.  The SARS-CoV-2 RNA is generally detectable in upper respiratory specimens during the acute phase of infection. The lowest concentration of SARS-CoV-2 viral copies this assay can detect is 138 copies/mL. A negative result does not preclude SARS-Cov-2 infection and should not be used as the sole basis for treatment or other patient management decisions. A negative result may occur with  improper specimen collection/handling, submission  of specimen other than nasopharyngeal swab, presence of viral mutation(s) within the areas targeted by this assay, and inadequate number of viral copies(<138 copies/mL). A negative result must be combined with clinical observations, patient history, and epidemiological information. The expected result is Negative.  Fact Sheet for Patients:  bloggercourse.com  Fact Sheet for Healthcare Providers:  seriousbroker.it  This test is no t yet approved or cleared by the [place]  FDA and  has been authorized for detection and/or diagnosis of SARS-CoV-2 by FDA under an Emergency Use Authorization (EUA). This EUA will remain  in effect (meaning this test can be used) for the duration of the COVID-19 declaration under Section 564(b)(1) of the Act, 21 U.S.C.section 360bbb-3(b)(1), unless the authorization is terminated  or revoked sooner.       Influenza A by PCR NEGATIVE NEGATIVE Final   Influenza B by PCR NEGATIVE NEGATIVE Final    Comment: (NOTE) The Xpert Xpress SARS-CoV-2/FLU/RSV plus assay is intended as an aid in the diagnosis of influenza from Nasopharyngeal swab specimens and should not be used as a sole basis  for treatment. Nasal washings and aspirates are unacceptable for Xpert Xpress SARS-CoV-2/FLU/RSV testing.  Fact Sheet for Patients: bloggercourse.com  Fact Sheet for Healthcare Providers: seriousbroker.it  This test is not yet approved or cleared by the [place]  FDA and has been authorized for detection and/or diagnosis of SARS-CoV-2 by FDA under an Emergency Use Authorization (EUA). This EUA will remain in effect (meaning this test can be used) for the duration of the COVID-19 declaration under Section 564(b)(1) of the Act, 21 U.S.C. section 360bbb-3(b)(1), unless the authorization is terminated or revoked.     Resp Syncytial Virus by PCR NEGATIVE NEGATIVE Final    Comment: (NOTE) Fact Sheet for Patients: bloggercourse.com  Fact Sheet for Healthcare Providers: seriousbroker.it  This test is not yet approved or cleared by the [place]  FDA and has been authorized for detection and/or diagnosis of SARS-CoV-2 by FDA under an Emergency Use Authorization (EUA). This EUA will remain in effect (meaning this test can be used) for the duration of the COVID-19 declaration under Section 564(b)(1) of the Act, 21 U.S.C. section 360bbb-3(b)(1), unless the authorization is terminated or revoked.  Performed at [facility], [address], [place], [place] [address]      Labs: BNP (last 3 results) Recent Labs    04/04/23 0642 10/22/23 1846  BNP 255.1* 426.5*   Basic Metabolic Panel: Recent Labs  Lab 02/01/24 0634 02/01/24 1208 02/02/24 0455  NA 143  --  138  K 3.6  --  4.6  CL 106  --  102  CO2 27  --  24  GLUCOSE 157*  --  302*  BUN 20  --  37*  CREATININE 1.08  --  1.29*  CALCIUM  9.4  --  9.2  MG  --  1.3*  --    Liver Function Tests: No results for input(s): AST, ALT, ALKPHOS, BILITOT, PROT, ALBUMIN in the last 168 hours. No  results for input(s): LIPASE, AMYLASE in the last 168 hours. No results for input(s): AMMONIA in the last 168 hours. CBC: Recent Labs  Lab 02/01/24 0634 02/02/24 0455 02/02/24 1604  WBC 11.7* 11.5*  --   NEUTROABS 8.0*  --   --   HGB 7.8* 7.4* 9.0*  HCT 26.9* 25.6* 29.8*  MCV 79.1* 77.1*  --   PLT 390 415*  --    Cardiac Enzymes: No results for input(s): CKTOTAL, CKMB, CKMBINDEX, TROPONINI  in the last 168 hours. BNP: Invalid input(s): POCBNP CBG: Recent Labs  Lab 02/01/24 1613 02/01/24 2030 02/02/24 0714 02/02/24 1144 02/02/24 1720  GLUCAP 278* 365* 281* 321* 175*   D-Dimer No results for input(s): DDIMER in the last 72 hours. Hgb A1c No results for input(s): HGBA1C in the last 72 hours. Lipid Profile No results for input(s): CHOL, HDL, LDLCALC, TRIG, CHOLHDL, LDLDIRECT in the last 72 hours. Thyroid  function studies No results for input(s): TSH, T4TOTAL, T3FREE, THYROIDAB in the last 72 hours.  Invalid input(s): FREET3 Anemia work up Recent Labs    02/01/24 1208  VITAMINB12 606  FOLATE 17.8  FERRITIN 55  TIBC 377  IRON  10*  RETICCTPCT 2.1   Urinalysis    Component Value Date/Time   COLORURINE YELLOW 01/09/2023 1723   APPEARANCEUR CLEAR 01/09/2023 1723   LABSPEC 1.018 01/09/2023 1723   PHURINE 5.0 01/09/2023 1723   GLUCOSEU >=500 (A) 01/09/2023 1723   GLUCOSEU >=1000 (A) 06/22/2022 1152   HGBUR MODERATE (A) 01/09/2023 1723   BILIRUBINUR negative 04/28/2023 0855   KETONESUR 5 (A) 01/09/2023 1723   PROTEINUR Positive (A) 04/28/2023 0855   PROTEINUR NEGATIVE 01/09/2023 1723   UROBILINOGEN negative (A) 04/28/2023 0855   UROBILINOGEN 0.2 06/22/2022 1152   NITRITE negative 04/28/2023 0855   NITRITE NEGATIVE 01/09/2023 1723   LEUKOCYTESUR Moderate (2+) (A) 04/28/2023 0855   LEUKOCYTESUR NEGATIVE 01/09/2023 1723   Sepsis Labs Recent Labs  Lab 02/01/24 0634 02/02/24 0455  WBC 11.7* 11.5*    Microbiology Recent Results (from the past 240 hours)  Resp panel by RT-PCR (RSV, Flu A&B, Covid) Anterior Nasal Swab     Status: None   Collection Time: 02/01/24  6:30 AM   Specimen: Anterior Nasal Swab  Result Value Ref Range Status   SARS Coronavirus 2 by RT PCR NEGATIVE NEGATIVE Final    Comment: (NOTE) SARS-CoV-2 target nucleic acids are NOT DETECTED.  The SARS-CoV-2 RNA is generally detectable in upper respiratory specimens during the acute phase of infection. The lowest concentration of SARS-CoV-2 viral copies this assay can detect is 138 copies/mL. A negative result does not preclude SARS-Cov-2 infection and should not be used as the sole basis for treatment or other patient management decisions. A negative result may occur with  improper specimen collection/handling, submission of specimen other than nasopharyngeal swab, presence of viral mutation(s) within the areas targeted by this assay, and inadequate number of viral copies(<138 copies/mL). A negative result must be combined with clinical observations, patient history, and epidemiological information. The expected result is Negative.  Fact Sheet for Patients:  bloggercourse.com  Fact Sheet for Healthcare Providers:  seriousbroker.it  This test is no t yet approved or cleared by the [place]  FDA and  has been authorized for detection and/or diagnosis of SARS-CoV-2 by FDA under an Emergency Use Authorization (EUA). This EUA will remain  in effect (meaning this test can be used) for the duration of the COVID-19 declaration under Section 564(b)(1) of the Act, 21 U.S.C.section 360bbb-3(b)(1), unless the authorization is terminated  or revoked sooner.       Influenza A by PCR NEGATIVE NEGATIVE Final   Influenza B by PCR NEGATIVE NEGATIVE Final    Comment: (NOTE) The Xpert Xpress SARS-CoV-2/FLU/RSV plus assay is intended as an aid in the diagnosis of influenza  from Nasopharyngeal swab specimens and should not be used as a sole basis for treatment. Nasal washings and aspirates are unacceptable for Xpert Xpress SARS-CoV-2/FLU/RSV testing.  Fact Sheet for Patients: bloggercourse.com  Fact Sheet for Healthcare Providers: seriousbroker.it  This test is not yet approved or cleared by the [place]  FDA and has been authorized for detection and/or diagnosis of SARS-CoV-2 by FDA under an Emergency Use Authorization (EUA). This EUA will remain in effect (meaning this test can be used) for the duration of the COVID-19 declaration under Section 564(b)(1) of the Act, 21 U.S.C. section 360bbb-3(b)(1), unless the authorization is terminated or revoked.     Resp Syncytial Virus by PCR NEGATIVE NEGATIVE Final    Comment: (NOTE) Fact Sheet for Patients: bloggercourse.com  Fact Sheet for Healthcare Providers: seriousbroker.it  This test is not yet approved or cleared by the [place]  FDA and has been authorized for detection and/or diagnosis of SARS-CoV-2 by FDA under an Emergency Use Authorization (EUA). This EUA will remain in effect (meaning this test can be used) for the duration of the COVID-19 declaration under Section 564(b)(1) of the Act, 21 U.S.C. section 360bbb-3(b)(1), unless the authorization is terminated or revoked.  Performed at [facility], [address] [place], [place] [address]      Time coordinating discharge: Over 30 minutes  SIGNED:   Camellia PARAS Jonah Nestle, DO  [facility] 02/02/2024, 5:25 PM

## 2024-02-02 NOTE — TOC Initial Note (Signed)
 Transition of Care [facility]) - Initial/Assessment Note    Patient Details  Name: [name] MRN: [id number] Date of Birth: March 07, 1928  [facility]) CM/SW Contact:    Bascom Service, RN Phone Number: 02/02/2024, 2:20 PM Clinical Narrative:  Spoke to patient about d/c plans-home. Has own transport home.                 Expected Discharge Plan: Home/Self Care Barriers to Discharge: Continued Medical Work up   Patient Goals and CMS Choice Patient states their goals for this hospitalization and ongoing recovery are:: Home CMS Medicare.gov Compare Post Acute Care list provided to:: Patient Choice offered to / list presented to : Patient [place] ownership interest in [facility].provided to:: Patient    Expected Discharge Plan and Services   Discharge Planning Services: CM Consult Post Acute Care Choice: Resumption of Svcs/PTA Provider Living arrangements for the past 2 months: Single Family Home                                      Prior Living Arrangements/Services Living arrangements for the past 2 months: Single Family Home Lives with:: Spouse              Current home services: DME (rw,cane)    Activities of Daily Living   ADL Screening (condition at time of admission) Independently performs ADLs?: Yes (appropriate for developmental age) Is the patient deaf or have difficulty hearing?: Yes Does the patient have difficulty seeing, even when wearing glasses/contacts?: No Does the patient have difficulty concentrating, remembering, or making decisions?: No  Permission Sought/Granted                  Emotional Assessment              Admission diagnosis:  Atrial fibrillation with RVR (HCC) [I48.91] Acute on chronic heart failure, unspecified heart failure type (HCC) [I50.9] Patient Active Problem List   Diagnosis Date Noted   Atrial fibrillation with RVR (HCC) 02/01/2024   Sepsis due to pneumonia (HCC) 10/22/2023    Generalized weakness 10/22/2023   Permanent atrial fibrillation (HCC) 10/22/2023   Community acquired pneumonia of right lower lobe of lung 10/22/2023   Stage 3b chronic kidney disease (HCC) 05/09/2023   Sepsis without acute organ dysfunction (HCC) 01/10/2023   History of lower GI bleeding 06/10/2022   Hiatal hernia 05/05/2022   Osteoarthritis of metacarpophalangeal (MCP) joint of left thumb 01/18/2022   Hypokalemia 10/10/2021   Mild cognitive impairment 03/05/2021   Stable hemispheric central retinal vein occlusion (CRVO) of right eye (HCC) 07/21/2020   Moderate nonproliferative diabetic retinopathy of left eye (HCC) 06/18/2020   Thoracic aortic aneurysm without rupture 05/26/2020   CKD stage 3a, GFR 45-59 ml/min (HCC) 05/26/2020   Aortic stenosis, moderate 05/22/2019   Chronic heart failure with preserved ejection fraction (HCC) 03/14/2019   Lower extremity edema 03/14/2019   Spinal stenosis of lumbar region 02/21/2018   Degeneration of lumbar intervertebral disc 02/21/2018   Diabetic peripheral neuropathy associated with type 2 diabetes mellitus (HCC) 05/05/2017   Atherosclerosis of native coronary artery of native heart without angina pectoris 06/13/2016   CVA (cerebral vascular accident) (HCC) 06/06/2016   Recurrent coronary arteriosclerosis after percutaneous transluminal coronary angioplasty 04/18/2015   Epistaxis, recurrent 04/11/2015   CAD S/P PCI- Nov 2016    Chronic vertigo 03/15/2014   Chronic anticoagulation 02/06/2013   Obesity (  BMI 30.0-34.9) 01/31/2012   Osteoarthritis, knee 01/31/2012   Allergic rhinitis 10/28/2011   Chronic atrial fibrillation (HCC) 07/30/2011   Hearing loss 06/08/2011   Primary osteoarthritis of hand 06/08/2011   Enlarged prostate without lower urinary tract symptoms (luts) 01/04/2011   Gastro-esophageal reflux disease without esophagitis 12/07/2010   Hx of CABG x 2 1990    Hypertension associated with diabetes (HCC)    Diabetes mellitus  with diabetic nephropathy, with long-term current use of insulin  (HCC)    Combined hyperlipidemia associated with type 2 diabetes mellitus (HCC)    Benign hematuria 08/27/2008   PCP:  Jodie Lavern CROME, MD Pharmacy:   [facility] [phone] - [place], [place] - [address] [place] [address] Phone: [phone] Fax: [phone]  [place] [facility] [address] [place] [address] Phone: [phone] Fax: [phone]  [facility] - [place], [place] - [address] [place] [address] Phone: [phone] Fax: [phone]     Social Drivers of Health (SDOH) Social History: SDOH Screenings   Food Insecurity: No Food Insecurity (02/01/2024)  Housing: Low Risk  (02/01/2024)  Transportation Needs: No Transportation Needs (02/01/2024)  Utilities: Not At Risk (02/01/2024)  Depression (PHQ2-9): Low Risk  (01/02/2024)  Financial Resource Strain: Low Risk  (04/13/2023)  Physical Activity: Insufficiently Active (04/13/2023)  Social Connections: Moderately Isolated (02/01/2024)  Stress: No Stress Concern Present (04/13/2023)  Tobacco Use: Medium Risk (02/01/2024)  Health Literacy: Adequate Health Literacy (04/13/2023)   SDOH Interventions:     Readmission Risk Interventions    01/11/2023    5:06 PM  Readmission Risk Prevention Plan  Transportation Screening Not Complete  Transportation Screening Comment Unable to speak to wife or patient.  PCP or Specialist Appt within 3-5 Days Complete  HRI or Home Care Consult Complete  Social Work Consult for Recovery Care Planning/Counseling Complete  Palliative Care Screening Complete  Medication Review Oceanographer) Referral to Pharmacy

## 2024-02-03 ENCOUNTER — Encounter: Payer: Self-pay | Admitting: Family Medicine

## 2024-02-03 ENCOUNTER — Telehealth: Payer: Self-pay

## 2024-02-03 LAB — TYPE AND SCREEN
ABO/RH(D): A NEG
Antibody Screen: NEGATIVE
Unit division: 0

## 2024-02-03 LAB — BPAM RBC
Blood Product Expiration Date: 202512182359
ISSUE DATE / TIME: 202512041153
Unit Type and Rh: 600

## 2024-02-03 NOTE — Telephone Encounter (Signed)
 Copied from CRM [id number]. Topic: Clinical - Medication Question >> Feb 03, 2024  1:15 PM Sophia H wrote: Reason for CRM: Spoke with Elijah - patients daughter who states the patient had been hospitalized from 12/3-12/4. While in the hospital he was given a blood transfusion. Patient has been coughing up clear phlegm since he was discharged - wants to know if an antibiotic and medication for cough can be called in for the patient to help keep him getting sick again. Appt on the 9th but want to get medication sent in ASAP if possible.  # [phone]  Please Advise  Message sent to pcp to address

## 2024-02-03 NOTE — Telephone Encounter (Signed)
Spoke with daughter and gave her the information

## 2024-02-07 ENCOUNTER — Encounter: Payer: Self-pay | Admitting: Family Medicine

## 2024-02-07 ENCOUNTER — Ambulatory Visit: Admitting: Family Medicine

## 2024-02-07 VITALS — BP 176/84 | HR 73 | Temp 98.4°F | Ht 66.0 in | Wt 190.4 lb

## 2024-02-07 DIAGNOSIS — Z8719 Personal history of other diseases of the digestive system: Secondary | ICD-10-CM

## 2024-02-07 DIAGNOSIS — Z7901 Long term (current) use of anticoagulants: Secondary | ICD-10-CM

## 2024-02-07 DIAGNOSIS — M48062 Spinal stenosis, lumbar region with neurogenic claudication: Secondary | ICD-10-CM

## 2024-02-07 DIAGNOSIS — I5032 Chronic diastolic (congestive) heart failure: Secondary | ICD-10-CM

## 2024-02-07 DIAGNOSIS — E1159 Type 2 diabetes mellitus with other circulatory complications: Secondary | ICD-10-CM

## 2024-02-07 DIAGNOSIS — D500 Iron deficiency anemia secondary to blood loss (chronic): Secondary | ICD-10-CM

## 2024-02-07 DIAGNOSIS — I482 Chronic atrial fibrillation, unspecified: Secondary | ICD-10-CM

## 2024-02-07 DIAGNOSIS — R9389 Abnormal findings on diagnostic imaging of other specified body structures: Secondary | ICD-10-CM

## 2024-02-07 DIAGNOSIS — I350 Nonrheumatic aortic (valve) stenosis: Secondary | ICD-10-CM

## 2024-02-07 NOTE — Patient Instructions (Signed)
 Please return in 4 weeks for recheck and blood work.   If you have any questions or concerns, please don't hesitate to send me a message via MyChart or call the office at [phone]. Thank you for visiting with us  today! It's our pleasure caring for you.

## 2024-02-07 NOTE — Progress Notes (Signed)
 Subjective  CC:  Chief Complaint  Patient presents with   Hospitalization Follow-up    02/01/2024 - 02/02/2024 (35 hours) [facility]  Chronic heart failure    HPI: [name] is a 89 y.o. male who presents to the office today to address the problems listed above in the chief complaint. Discussed the use of AI scribe software for clinical note transcription with the patient, who gave verbal consent to proceed.  History of Present Illness [name] is a 89 year old male with heart failure who presents for follow-up after recent hospitalization. He is accompanied by his wife.  Dyspnea and cardiac symptoms - Breathing has returned to baseline since hospital discharge - No chest pain, palpitations, or ongoing cough - Previous frequent coughing has resolved - I have reviewed all hospital records and chest xray findings.   Anemia and recent hospitalization, likely from slow chronic GI blood loss w/ negative eval in past on eloquis.  - Recently hospitalized for hemoglobin drop to 7.8 g/dL - Received blood transfusion administered slowly to prevent cardiac overload - Usual hemoglobin levels range between 9 and 10 g/dL - Colonoscopy performed 1-2 years ago showed polyps but no active bleeding - No change in energy levels or increased energy post-transfusion; he denied feeling fatigued. - No fever, cough, or chest pain during or after hospitalization  Gait instability - Experiences unsteadiness while walking - Uses a cane for support when ambulating - Does not use assistive devices at home   Assessment  1. Chronic heart failure with preserved ejection fraction (HCC)   2. Chronic atrial fibrillation (HCC)   3. Chronic anticoagulation   4. History of lower GI bleeding   5. Spinal stenosis of lumbar region with neurogenic claudication   6. Iron  deficiency anemia due to chronic blood loss   7. Aortic stenosis, moderate   8. Abnormal chest  x-ray      Plan  Assessment and Plan Assessment & Plan Heart failure Chronic heart failure with recent exacerbation characterized by shortness of breath and cough, now resolved. No palpitations or chest pain. Breathing has returned to normal. Chronic lung changes present, but no signs of pneumonia. Heart function is compromised due to age and chronic condition. - Continue current management for heart failure.  Chronic blood loss anemia Likely due to gastrointestinal bleeding, possibly from polyps identified in previous colonoscopy. Hemoglobin dropped to 7.8, necessitating blood transfusion. No active bleeding observed during last colonoscopy. Blood transfusion administered slowly to avoid cardiac overload. Energy levels unchanged post-transfusion. - Continue to monitor hemoglobin levels. - Avoid unnecessary colonoscopy unless clinically indicated. - will recheck cbc in 4 weeks.  Last hgb was 9.0 5 days ago after transfusion of PRBC  Chronic chest xray interstitial findings. Last ct was 2024. Likley chronic disease. No sign of infection at this time.   Continue eloquis for afib/anticoagulation. Currently rate controlled.   HTN: bp mildly elevated today. Given recent diuresis and hospital course, will monitor. No med changes made today.   Gait instability Experiencing unsteadiness while walking, requiring use of a cane. No pain reported, but unsteadiness is present. No assistive devices used at home.    Follow up: 4 weeks for recheck No orders of the defined types were placed in this encounter.  No orders of the defined types were placed in this encounter.    I reviewed the patients updated PMH, FH, and SocHx.  Patient Active Problem List   Diagnosis Date Noted   History  of lower GI bleeding 06/10/2022    Priority: High   Mild cognitive impairment 03/05/2021    Priority: High   Moderate nonproliferative diabetic retinopathy of left eye (HCC) 06/18/2020    Priority: High   CKD  stage 3a, GFR 45-59 ml/min (HCC) 05/26/2020    Priority: High   Aortic stenosis, moderate 05/22/2019    Priority: High   Chronic heart failure with preserved ejection fraction (HCC) 03/14/2019    Priority: High   Spinal stenosis of lumbar region 02/21/2018    Priority: High   Diabetic peripheral neuropathy associated with type 2 diabetes mellitus (HCC) 05/05/2017    Priority: High   Atherosclerosis of native coronary artery of native heart without angina pectoris 06/13/2016    Priority: High   CVA (cerebral vascular accident) (HCC) 06/06/2016    Priority: High   Recurrent coronary arteriosclerosis after percutaneous transluminal coronary angioplasty 04/18/2015    Priority: High   CAD S/P PCI- Nov 2016     Priority: High   Chronic anticoagulation 02/06/2013    Priority: High   Chronic atrial fibrillation (HCC) 07/30/2011    Priority: High   Hx of CABG x 2 1990     Priority: High   Hypertension associated with diabetes (HCC)     Priority: High   Diabetes mellitus with diabetic nephropathy, with long-term current use of insulin  (HCC)     Priority: High   Combined hyperlipidemia associated with type 2 diabetes mellitus (HCC)     Priority: High   Thoracic aortic aneurysm without rupture 05/26/2020    Priority: Medium    Degeneration of lumbar intervertebral disc 02/21/2018    Priority: Medium    Chronic vertigo 03/15/2014    Priority: Medium    Obesity (BMI 30.0-34.9) 01/31/2012    Priority: Medium    Enlarged prostate without lower urinary tract symptoms (luts) 01/04/2011    Priority: Medium    Gastro-esophageal reflux disease without esophagitis 12/07/2010    Priority: Medium    Hiatal hernia 05/05/2022    Priority: Low   Osteoarthritis of metacarpophalangeal (MCP) joint of left thumb 01/18/2022    Priority: Low   Stable hemispheric central retinal vein occlusion (CRVO) of right eye (HCC) 07/21/2020    Priority: Low   Lower extremity edema 03/14/2019    Priority: Low    Epistaxis, recurrent 04/11/2015    Priority: Low   Osteoarthritis, knee 01/31/2012    Priority: Low   Allergic rhinitis 10/28/2011    Priority: Low   Hearing loss 06/08/2011    Priority: Low   Primary osteoarthritis of hand 06/08/2011    Priority: Low   Benign hematuria 08/27/2008    Priority: Low   Atrial fibrillation with RVR (HCC) 02/01/2024   Sepsis due to pneumonia (HCC) 10/22/2023   Generalized weakness 10/22/2023   Permanent atrial fibrillation (HCC) 10/22/2023   Community acquired pneumonia of right lower lobe of lung 10/22/2023   Stage 3b chronic kidney disease (HCC) 05/09/2023   Sepsis without acute organ dysfunction (HCC) 01/10/2023   Hypokalemia 10/10/2021   Current Meds  Medication Sig   albuterol  (VENTOLIN  HFA) 108 (90 Base) MCG/ACT inhaler Inhale 2 puffs into the lungs every 6 (six) hours as needed for wheezing or shortness of breath.   apixaban  (ELIQUIS ) 5 MG TABS tablet Take 1 tablet (5 mg total) by mouth 2 (two) times daily.   atorvastatin  (LIPITOR) 40 MG tablet Take 1 tablet (40 mg total) by mouth at bedtime.   diltiazem  (CARDIZEM  CD) 120 MG  24 hr capsule Take 1 capsule (120 mg total) by mouth daily.   dutasteride  (AVODART ) 0.5 MG capsule Take 1 capsule (0.5 mg total) by mouth daily.   furosemide  (LASIX ) 40 MG tablet TAKE 1 TABLET(40 MG) BY MOUTH DAILY   gabapentin  (NEURONTIN ) 300 MG capsule Take 2 capsules (600 mg total) by mouth at bedtime.   insulin  glargine (LANTUS ) 100 UNIT/ML injection Inject 0.28 mLs (28 Units total) into the skin every morning. (Patient taking differently: Inject 26 Units into the skin in the morning.)   meclizine  (ANTIVERT ) 25 MG tablet Take 0.5-1 tablets (12.5-25 mg total) by mouth 2 (two) times daily as needed for dizziness.   meloxicam  (MOBIC ) 7.5 MG tablet Take 1 tablet (7.5 mg total) by mouth daily as needed. (Patient taking differently: Take 7.5 mg by mouth daily as needed for pain.)   metFORMIN  (GLUCOPHAGE ) 1000 MG tablet Take 1  tablet (1,000 mg total) by mouth 2 (two) times daily with a meal.   metoprolol  tartrate (LOPRESSOR ) 50 MG tablet Take 50 mg by mouth 2 (two) times daily.   pantoprazole  (PROTONIX ) 40 MG tablet TAKE 1 TABLET BY MOUTH DAILY AT NOON   tamsulosin  (FLOMAX ) 0.4 MG CAPS capsule Take 1 capsule (0.4 mg total) by mouth daily.   Allergies: Patient is allergic to cefadroxil, ciprofloxacin, erythromycin, and septra  [sulfamethoxazole -trimethoprim ]. Family History: Patient family history includes Brain cancer in his father; Throat cancer in his brother. Social History:  Patient  reports that he quit smoking about 65 years ago. His smoking use included cigarettes. He started smoking about 75 years ago. He has a 30 pack-year smoking history. He has never used smokeless tobacco. He reports that he does not drink alcohol and does not use drugs.  Review of Systems: Constitutional: Negative for fever malaise or anorexia Cardiovascular: negative for chest pain Respiratory: negative for SOB or persistent cough Gastrointestinal: negative for abdominal pain  Objective  Vitals: BP (!) 176/84   Pulse 73   Temp 98.4 F (36.9 C)   Ht 5' 6 (1.676 m)   Wt 190 lb 6.4 oz (86.4 kg)   SpO2 97%   BMI 30.73 kg/m  General: no acute distress , A&Ox3, appears comfortable HEENT: PEERL, conjunctiva normal, neck is supple Cardiovascular:  irreg irreg Respiratory:  Good breath sounds bilaterally, no wheezing. Occ rale at base Skin:  Warm, no rashes Commons side effects, risks, benefits, and alternatives for medications and treatment plan prescribed today were discussed, and the patient expressed understanding of the given instructions. Patient is instructed to call or message via MyChart if he/she has any questions or concerns regarding our treatment plan. No barriers to understanding were identified. We discussed Red Flag symptoms and signs in detail. Patient expressed understanding regarding what to do in case of urgent or  emergency type symptoms.  Medication list was reconciled, printed and provided to the patient in AVS. Patient instructions and summary information was reviewed with the patient as documented in the AVS. This note was prepared with assistance of Dragon voice recognition software. Occasional wrong-word or sound-a-like substitutions may have occurred due to the inherent limitations of voice recognition software

## 2024-02-09 ENCOUNTER — Ambulatory Visit: Admitting: Podiatry

## 2024-02-09 DIAGNOSIS — M79674 Pain in right toe(s): Secondary | ICD-10-CM | POA: Diagnosis not present

## 2024-02-09 DIAGNOSIS — E1142 Type 2 diabetes mellitus with diabetic polyneuropathy: Secondary | ICD-10-CM | POA: Diagnosis not present

## 2024-02-09 DIAGNOSIS — B351 Tinea unguium: Secondary | ICD-10-CM | POA: Diagnosis not present

## 2024-02-09 DIAGNOSIS — D689 Coagulation defect, unspecified: Secondary | ICD-10-CM | POA: Diagnosis not present

## 2024-02-09 NOTE — Progress Notes (Signed)
This patient returns to my office for at risk foot care.  This patient requires this care by a professional since this patient will be at risk due to having diabetic neuropathy and coagulation defect.  Patient is taking eliquiss.  Patient says toenails are painful walking and wearing his shoes.  This patient presents for at risk foot care today.  General Appearance  Alert, conversant and in no acute stress.  Vascular  Dorsalis pedis and posterior tibial  pulses are weakly  palpable  bilaterally.  Capillary return is within normal limits  bilaterally. Temperature is within normal limits  bilaterally.  Neurologic  Senn-Weinstein monofilament wire test within normal limits  bilaterally. Muscle power within normal limits bilaterally.  Nails Thick disfigured discolored nails with subungual debris  from hallux to fifth toes bilaterally. No evidence of bacterial infection or drainage bilaterally.  Orthopedic  No limitations of motion  feet .  No crepitus or effusions noted.  No bony pathology or digital deformities noted.  Skin  normotropic skin with no porokeratosis noted bilaterally.  No signs of infections or ulcers noted.     Onychomycosis  B/L  Consent was obtained for treatment procedures.   Debridement of nails with nail nipper followed by dremel tool. Filed with dremel without incident.    Return office visit    12 weeks                Told patient to return for periodic foot care and evaluation due to potential at risk complications.   Rick Warnick DPM  

## 2024-02-14 NOTE — Progress Notes (Unsigned)
 Cardiology Office Note    Date:  02/14/2024  ID:  [name], [name] 11/27/1928, MRN [id number] PCP:  Jodie Lavern CROME, MD  Cardiologist:  Peter Jordan, MD  Electrophysiologist:  None   Chief Complaint: ***  History of Present Illness: .    [name] is a 89 y.o. male with visit-pertinent history of CAD s/p CABG in 1990 with subsequent DES to RCA in 2006, permanent atrial fibrillation, chronic diastolic heart failure, moderate to severe aortic stenosis, bradycardia, presyncope, hypertension, hyperlipidemia, CVA, carotid artery disease, GI bleed and type 2 diabetes.  In 2021 patient was hospitalized with acute respiratory failure secondary pneumonia, pleural effusion, managed with IV antibiotics and thoracentesis.  He was hospitalized again in 09/2021 with sepsis due to pneumonia, managed with antibiotics.  He was readmitted later that month with CHF.  Echocardiogram in 09/2021 showed EF 55 to 60%, interventricular septum flattening in systole and diastole consistent with RV pressure and volume overload, mild RV systolic dysfunction, mild BAE, mild to moderate aortic stenosis.  In 04/2022 he was evaluated in the ED for shortness of breath, dizziness and presyncope.  Prior to this he is following with GI for ongoing issues with GI bleeding and progressive blood loss anemia.  He was scheduled for outpatient colonoscopy.  Upon arrival to ED he was hypotensive and bradycardic cardiology was consulted in setting of bradycardia.  Toprol  and diltiazem  were held with improvement in heart rate.  Eliquis  was held in setting of GI bleed.  He tolerated reintroduction of low-dose metoprolol .  Echocardiogram showed progressive aortic stenosis, moderate to severe.  Endoscopy showed no significant bleeding.  He was noted to have moderate hiatal hernia.  Eliquis  was resumed postdischarge.  He was hospitalized in November 2024 in setting of sepsis due to community-acquired pneumonia with acute  hypoxic respiratory failure.  History with antibiotics.  Cardiology was consulted in setting of atrial fibrillation with RVR.  Diltiazem  restarted.  Echocardiogram in 04/2023 indicated LVEF 50 to 55%, no RWMA, diastolic parameters were indeterminate, RV systolic function moderately reduced, RV size was normal, normal PASP.  Trivial mitral regurgitation, no evidence of stenosis, moderate mitral annular calcification.  AS mild by gradient but severe AVA and DI, moderate to severe aortic stenosis, aortic valve regurgitation mild.  Patient was admitted from 8/23 through 10/24/2023 with pneumonia and sepsis felt to be viral.  Patient was last seen in clinic by Dr. Jordan on 11/19/2023.  He remained stable from cardiac standpoint, did note some intermittent dizziness.  Patient was admitted from 12/3-12/4/25 with shortness of breath that started the day prior to admission.  Patient noted he had recently been treated for antibiotics with prednisone  taper and azithromycin .  On arrival oxygen saturation noted be 90% and was placed on nasal cannula, heart rates between 110 to 120 bpm.  Patient received one-time dose of IV Lasix  in the ED for elevated BNP, was started on Cardizem  drip with improvement in heart rate and was tired titrated off, started on home dosages.  Patient was given 1 unit of packed red blood cells given anemia.  Today he presents for follow-up.  He reports that he  Permanent atrial fibrillation: Patient with history of permanent atrial fibrillation.  Recently hospitalized with A-fib with RVR in setting of anemia.  Resumed home medications and given 1 unit of PRBCs.  Anemia monitored by PCP, to have repeat lab work in 2 weeks.  CAD: S/p CABG in 1990 with subsequent DES-RCA in 2006.   Today he  reports that he   Chronic diastolic HF: Echocardiogram in 04/2023 indicated LVEF 50 to 55%, no RWMA, diastolic parameters were indeterminate. Today he reports that he   Aortic stenosis: On medical in  04/2023 a small upper gradient was severe by AVA and DI, moderate to severe aortic stenosis, aortic valve [name] mild. Today he   HTN: Blood pressure today   HLD: Last lipid profile on   Dilation of ascending aorta:   History of CVA:     Labwork independently reviewed:   ROS: .   *** denies chest pain, shortness of breath, lower extremity edema, fatigue, palpitations, melena, hematuria, hemoptysis, diaphoresis, weakness, presyncope, syncope, orthopnea, and PND.  All other systems are reviewed and otherwise negative.  Studies Reviewed: SABRA    EKG:  EKG is ordered today, personally reviewed, demonstrating ***     CV Studies: Cardiac studies reviewed are outlined and summarized above. Otherwise please see EMR for full report. Cardiac Studies & Procedures   ______________________________________________________________________________________________ CARDIAC CATHETERIZATION  CARDIAC CATHETERIZATION 01/08/2015  Conclusion  Prox RCA lesion, 40% stenosed.  LM lesion, 20% stenosed.  Ost LAD to Prox LAD lesion, 100% stenosed.  SVG .  Origin lesion, 100% stenosed.  Mid RCA-2 lesion, 70% stenosed.  Mid RCA-1 lesion, 99% stenosed. Post intervention, there is a 0% residual stenosis.  1. Severe 2 vessel obstructive CAD. CTO of the origin of the LAD. Critical mid RCA stenosis. 2. Occluded SVG to the diagonal 3. LIMA to the LAD was not visualized but assumed patent based on clinical history and Myoview  findings. 4. Normal LV EDP. 5. Successful stenting of the Mid RCA with DES. Very difficult procedure due to vessel tortuosity.  Plan: DAPT with ASA and Plavix  for one month then stop ASA and continue Plavix  for at least one year. Resume Coumadin  tomorrow. Will assess LV function with an Echo. Stop prilosec and start protonix . Anticipate DC in am if stable. Patient noted to be bradycardic throughout case so will reduce metoprolol  to 50 mg daily.  Findings Coronary  Findings Diagnostic  Dominance: Right  Left Main  Left Anterior Descending  Ramus Intermedius . Vessel is large.  Left Circumflex  First Obtuse Marginal Branch The vessel is small in size.  Second Obtuse Marginal Branch The vessel is small in size.  Right Coronary Artery . Vessel is large.  Discrete.  Acute Marginal Branch The vessel is small in size.  Single Graft Graft To 1st Diag SVG  Intervention  Mid RCA-1 lesion PCI (Also treats lesions: Mid RCA-2) The pre-interventional distal flow is decreased (TIMI 2). Pre-stent angioplasty was performed. 2.0, 2.5, and 3.0 mm balloons A drug-eluting stent was placed. No post-stent angioplasty was performed. The post-interventional distal flow is normal (TIMI 3). The intervention was successful. No complications occurred at this lesion. Supplies used: STENT SYNERGY DES 3X28 There is a 0% residual stenosis post intervention.  Mid RCA-2 lesion PCI (Also treats lesions: Mid RCA-1) See details in Mid RCA-1 lesion. Supplies used: STENT SYNERGY DES 3X28 There is a 0% residual stenosis post intervention.   STRESS TESTS  MYOCARDIAL PERFUSION IMAGING 12/27/2014  Interpretation Summary  T wave inversion was noted during stress in the II, III, aVF, V4, V5 and V6 leads.  Downsloping ST segment depression ST segment depression was noted during stress in the II, III and aVF leads.  Defect 1: There is a medium defect of moderate severity present in the basal inferior, mid inferior and apical lateral location.  This is an intermediate  risk study.  Intermediate risk lexiscan  nuclear study demonstrating a medium size defect of moderate intensity in the distal inferolateral and mid to basal inferior wall consistent with ischemia in the RCA territory. (Extent 19%; TPD 15%). Gating not done due to AF.  Clinical correlation is recommended.   ECHOCARDIOGRAM  ECHOCARDIOGRAM COMPLETE 05/02/2023  Narrative ECHOCARDIOGRAM  REPORT    Patient Name:   [name] [facility] Date of Exam: 05/02/2023 Medical Rec #:  [id number]              Height:       65.0 in Accession #:    [id number]             Weight:       191.4 lb Date of Birth:  August 08, 1928              BSA:          1.942 m Patient Age:    89 years               BP:           132/70 mmHg Patient Gender: M                      HR:           90 bpm. Exam Location:  Outpatient  Procedure: 2D Echo, Cardiac Doppler and Color Doppler (Both Spectral and Color Flow Doppler were utilized during procedure).  Indications:    Aortic stenosis, moderate [I35.0 (ICD-10-CM)]  History:        Patient has prior history of Echocardiogram examinations, most recent 05/02/2022. CAD, Aortic Valve Disease; Risk Factors:Hypertension, Diabetes and Dyslipidemia.  Sonographer:    Rosaline Fujisawa MHA, RDMS, RVT, RDCS Referring Phys: [id number] EMILY C MONGE   Sonographer Comments: Suboptimal subcostal window. Image acquisition challenging due to respiratory motion and Image acquisition challenging due to patient body habitus. IMPRESSIONS   1. Left ventricular ejection fraction, by estimation, is 50 to 55%. The left ventricle has low normal function. The left ventricle has no regional wall motion abnormalities. Left ventricular diastolic parameters are indeterminate. The average left ventricular global longitudinal strain is -9.6 %. The global longitudinal strain is abnormal. 2. Right ventricular systolic function is moderately reduced. The right ventricular size is normal. There is normal pulmonary artery systolic pressure. 3. Left atrial size was severely dilated. 4. The mitral valve is abnormal. Trivial mitral valve regurgitation. No evidence of mitral stenosis. Moderate mitral annular calcification. 5. AS mild by gradient, but severe by AVA and DI (0.21). Stroke volume inaccurate, but cannot exclude low flow low gradient severe AS. The aortic valve is calcified. Aortic valve  regurgitation is mild. Moderate to severe aortic valve stenosis. Aortic valve area, by VTI measures 0.73 cm. Aortic valve mean gradient measures 10.8 mmHg. Aortic valve Vmax measures 2.24 m/s. 6. The inferior vena cava is normal in size with <50% respiratory variability, suggesting right atrial pressure of 8 mmHg.  Comparison(s): Changes from prior study are noted. EF slightly decreased, though still low normal, on current study. Moderate to severe aortic stenosis.  FINDINGS Left Ventricle: Left ventricular ejection fraction, by estimation, is 50 to 55%. The left ventricle has low normal function. The left ventricle has no regional wall motion abnormalities. The average left ventricular global longitudinal strain is -9.6 %. Strain was performed and the global longitudinal strain is abnormal. 3D ejection fraction reviewed and evaluated as part of the interpretation. Alternate measurement of EF is  felt to be most reflective of LV function. The left ventricular internal cavity size was normal in size. Suboptimal image quality limits for assessment of left ventricular hypertrophy. Left ventricular diastolic parameters are indeterminate.  Right Ventricle: The right ventricular size is normal. Right vetricular wall thickness was not well visualized. Right ventricular systolic function is moderately reduced. There is normal pulmonary artery systolic pressure. The tricuspid regurgitant velocity is 2.12 m/s, and with an assumed right atrial pressure of 8 mmHg, the estimated right ventricular systolic pressure is 26.0 mmHg.  Left Atrium: Left atrial size was severely dilated.  Right Atrium: Right atrial size was normal in size.  Pericardium: There is no evidence of pericardial effusion.  Mitral Valve: The mitral valve is abnormal. There is moderate thickening of the mitral valve leaflet(s). There is moderate calcification of the mitral valve leaflet(s). Moderate mitral annular calcification. Trivial  mitral valve regurgitation. No evidence of mitral valve stenosis.  Tricuspid Valve: The tricuspid valve is normal in structure. Tricuspid valve regurgitation is trivial. No evidence of tricuspid stenosis.  Aortic Valve: AS mild by gradient, but severe by AVA and DI (0.21). Stroke volume inaccurate, but cannot exclude low flow low gradient severe AS. The aortic valve is calcified. Aortic valve regurgitation is mild. Aortic regurgitation PHT measures 1137 msec. Moderate to severe aortic stenosis is present. Aortic valve mean gradient measures 10.8 mmHg. Aortic valve peak gradient measures 20.1 mmHg. Aortic valve area, by VTI measures 0.73 cm.  Pulmonic Valve: The pulmonic valve was not well visualized. Pulmonic valve regurgitation is not visualized. No evidence of pulmonic stenosis.  Aorta: The aortic root and ascending aorta are structurally normal, with no evidence of dilitation.  Venous: The inferior vena cava is normal in size with less than 50% respiratory variability, suggesting right atrial pressure of 8 mmHg.  IAS/Shunts: The atrial septum is grossly normal.  Additional Comments: 3D was performed not requiring image post processing on an independent workstation and was indeterminate.   LEFT VENTRICLE PLAX 2D LVIDd:         4.86 cm   Diastology LVIDs:         3.66 cm   LV e' medial:    8.05 cm/s LV PW:         1.06 cm   LV E/e' medial:  13.7 LV IVS:        0.74 cm   LV e' lateral:   9.79 cm/s LVOT diam:     2.11 cm   LV E/e' lateral: 11.2 LV SV:         32 LV SV Index:   17        2D Longitudinal Strain LVOT Area:     3.50 cm  2D Strain GLS (A4C):   -6.3 % 2D Strain GLS (A3C):   -10.7 % 2D Strain GLS (A2C):   -11.8 % 2D Strain GLS Avg:     -9.6 %  3D Volume EF: 3D EF:        54 % LV EDV:       81 ml LV ESV:       37 ml LV SV:        44 ml  RIGHT VENTRICLE            IVC RV Basal diam:  3.31 cm    IVC diam: 1.58 cm RV Mid diam:    3.01 cm RV S prime:     7.72  cm/s TAPSE (M-mode): 1.0 cm  LEFT ATRIUM  Index        RIGHT ATRIUM           Index LA diam:        5.58 cm 2.87 cm/m   RA Area:     16.60 cm LA Vol (A2C):   86.7 ml 44.66 ml/m  RA Volume:   42.50 ml  21.89 ml/m LA Vol (A4C):   87.2 ml 44.91 ml/m LA Biplane Vol: 88.2 ml 45.43 ml/m AORTIC VALVE AV Area (Vmax):    0.88 cm AV Area (Vmean):   0.87 cm AV Area (VTI):     0.73 cm AV Vmax:           224.00 cm/s AV Vmean:          153.200 cm/s AV VTI:            0.441 m AV Peak Grad:      20.1 mmHg AV Mean Grad:      10.8 mmHg LVOT Vmax:         56.60 cm/s LVOT Vmean:        38.200 cm/s LVOT VTI:          0.092 m LVOT/AV VTI ratio: 0.21 AI PHT:            1137 msec  AORTA Ao Root diam: 3.26 cm Ao Asc diam:  3.76 cm  MITRAL VALVE                TRICUSPID VALVE MV Area (PHT): 4.21 cm     TR Peak grad:   18.0 mmHg MV Decel Time: 180 msec     TR Vmax:        212.00 cm/s MR Peak grad: 10.9 mmHg MR Vmax:      165.00 cm/s   SHUNTS MV E velocity: 110.00 cm/s  Systemic VTI:  0.09 m MV A velocity: 30.50 cm/s   Systemic Diam: 2.11 cm MV E/A ratio:  3.61  Shelda Bruckner MD Electronically signed by Shelda Bruckner MD Signature Date/Time: 05/02/2023/6:17:05 PM    Final          ______________________________________________________________________________________________       Current Reported Medications:.    Active Medications[1]  Physical Exam:    VS:  There were no vitals taken for this visit.   Wt Readings from Last 3 Encounters:  02/07/24 190 lb 6.4 oz (86.4 kg)  02/02/24 191 lb 2.2 oz (86.7 kg)  01/02/24 191 lb 9.6 oz (86.9 kg)    GEN: Well nourished, well developed in no acute distress NECK: No JVD; No carotid bruits CARDIAC: ***RRR, no murmurs, rubs, gallops RESPIRATORY:  Clear to auscultation without rales, wheezing or rhonchi  ABDOMEN: Soft, non-tender, non-distended EXTREMITIES:  No edema; No acute deformity     Asessement  and Plan:.     ***     Disposition: F/u with ***  Signed, Quadasia Newsham D Audreyana Huntsberry, NP      [1]  No outpatient medications have been marked as taking for the 02/17/24 encounter (Appointment) with Brentley Landfair D, NP.

## 2024-02-15 DIAGNOSIS — E113291 Type 2 diabetes mellitus with mild nonproliferative diabetic retinopathy without macular edema, right eye: Secondary | ICD-10-CM | POA: Diagnosis not present

## 2024-02-15 DIAGNOSIS — H348112 Central retinal vein occlusion, right eye, stable: Secondary | ICD-10-CM | POA: Diagnosis not present

## 2024-02-15 DIAGNOSIS — E113292 Type 2 diabetes mellitus with mild nonproliferative diabetic retinopathy without macular edema, left eye: Secondary | ICD-10-CM | POA: Diagnosis not present

## 2024-02-17 ENCOUNTER — Encounter: Payer: Self-pay | Admitting: Cardiology

## 2024-02-17 ENCOUNTER — Ambulatory Visit: Attending: Cardiology | Admitting: Cardiology

## 2024-02-17 VITALS — BP 130/64 | HR 78 | Ht 66.0 in | Wt 182.6 lb

## 2024-02-17 DIAGNOSIS — I5032 Chronic diastolic (congestive) heart failure: Secondary | ICD-10-CM | POA: Insufficient documentation

## 2024-02-17 DIAGNOSIS — I482 Chronic atrial fibrillation, unspecified: Secondary | ICD-10-CM | POA: Diagnosis not present

## 2024-02-17 DIAGNOSIS — Z8673 Personal history of transient ischemic attack (TIA), and cerebral infarction without residual deficits: Secondary | ICD-10-CM | POA: Insufficient documentation

## 2024-02-17 DIAGNOSIS — I350 Nonrheumatic aortic (valve) stenosis: Secondary | ICD-10-CM | POA: Insufficient documentation

## 2024-02-17 DIAGNOSIS — I2510 Atherosclerotic heart disease of native coronary artery without angina pectoris: Secondary | ICD-10-CM | POA: Insufficient documentation

## 2024-02-17 DIAGNOSIS — I10 Essential (primary) hypertension: Secondary | ICD-10-CM | POA: Insufficient documentation

## 2024-02-17 NOTE — Patient Instructions (Addendum)
 Thank you for choosing [facility]!     Medication Instructions:  No medication changes were made during today's visit.  *If you need a refill on your cardiac medications before your next appointment, please call your pharmacy*   Lab Work: No labs were ordered during today's visit.  If you have labs (blood work) drawn today and your tests are completely normal, you will receive your results only by: MyChart Message (if you have MyChart) OR A paper copy in the mail If you have any lab test that is abnormal or we need to change your treatment, we will call you to review the results.   Testing/Procedures: No procedures were ordered during today's visit.    Provider:   Peter Jordan, MD     Follow-Up: At [facility], you and your health needs are our priority.  As part of our continuing mission to provide you with exceptional heart care, we have created designated Provider Care Teams.  These Care Teams include your primary Cardiologist (physician) and Advanced Practice Providers (APPs -  Physician Assistants and Nurse Practitioners) who all work together to provide you with the care you need, when you need it. We recommend signing up for the patient portal called MyChart.  Sign up information is provided on this After Visit Summary.  MyChart is used to connect with patients for Virtual Visits (Telemedicine).  Patients are able to view lab/test results, encounter notes, upcoming appointments, etc.  Non-urgent messages can be sent to your provider as well.   To learn more about what you can do with MyChart, go to [email or web address].

## 2024-02-26 ENCOUNTER — Other Ambulatory Visit: Payer: Self-pay | Admitting: Family Medicine

## 2024-03-01 ENCOUNTER — Other Ambulatory Visit: Payer: Self-pay | Admitting: Cardiology

## 2024-03-06 ENCOUNTER — Ambulatory Visit: Admitting: Sports Medicine

## 2024-03-06 ENCOUNTER — Encounter: Payer: Self-pay | Admitting: Sports Medicine

## 2024-03-06 ENCOUNTER — Other Ambulatory Visit: Payer: Self-pay

## 2024-03-06 DIAGNOSIS — M25551 Pain in right hip: Secondary | ICD-10-CM

## 2024-03-06 DIAGNOSIS — Z96641 Presence of right artificial hip joint: Secondary | ICD-10-CM | POA: Diagnosis not present

## 2024-03-06 DIAGNOSIS — E1121 Type 2 diabetes mellitus with diabetic nephropathy: Secondary | ICD-10-CM | POA: Diagnosis not present

## 2024-03-06 DIAGNOSIS — Z794 Long term (current) use of insulin: Secondary | ICD-10-CM

## 2024-03-06 DIAGNOSIS — G8929 Other chronic pain: Secondary | ICD-10-CM | POA: Diagnosis not present

## 2024-03-06 DIAGNOSIS — M7061 Trochanteric bursitis, right hip: Secondary | ICD-10-CM

## 2024-03-06 MED ORDER — METHYLPREDNISOLONE ACETATE 40 MG/ML IJ SUSP
40.0000 mg | INTRAMUSCULAR | Status: AC | PRN
  Administered 2024-03-06: 40 mg via INTRA_ARTICULAR

## 2024-03-06 MED ORDER — LIDOCAINE HCL 1 % IJ SOLN
4.0000 mL | INTRAMUSCULAR | Status: AC | PRN
  Administered 2024-03-06: 4 mL

## 2024-03-06 NOTE — Progress Notes (Signed)
 "  [name] - 89 y.o. male MRN [id number]  Date of birth: 1928/08/12  Office Visit Note: Visit Date: 03/06/2024 PCP: Jodie Lavern CROME, MD Referred by: Jodie Lavern CROME, MD  Subjective: Chief Complaint  Patient presents with   Right Hip - Pain   HPI: [name] is a pleasant 89 y.o. male who presents today for acute on chronic right hip pain.  Discussed the use of AI scribe software for clinical note transcription with the patient, who gave verbal consent to proceed.  History of Present Illness [name] is a 89 year old male with prior right hip arthroplasty who presents with recurrent right lateral hip pain.  He has persistent right lateral hip pain that recurs about every six months, with maximal tenderness over the lateral hip and no swelling or sense of fluid.  He had a right hip arthroplasty - followed by Dr. Jerri. Likely believed to have pseudotumor around this, which becomes symptomatic but no hardware abnormality.  He has received corticosteroid injections about every six months, which give good relief without post-injection soreness, and he prefers this to daily medications.   He is not using oral analgesics or topical agents and is minimally active without regular walking or exercise.  He is a type II diabetic, although has been better controlled as of late.  He is managed on Lantus  26 units in the morning.  Also on gabapentin  300 mg twice daily.  Lab Results  Component Value Date   HGBA1C 6.1 (H) 10/23/2023    Pertinent ROS were reviewed with the patient and found to be negative unless otherwise specified above in HPI.   Assessment & Plan: Visit Diagnoses:  1. Chronic right hip pain   2. Trochanteric bursitis, right hip   3. History of right hip replacement   4. 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  Managed effectively with periodic injections post-arthroplasty. Prefers injections over daily medication. No swelling or effusion noted. - Administered right hip injection for analgesia. - Advised ice and acetaminophen  for post-injection discomfort. - Applied adhesive dressing to injection site, remove after next shower or bath. - Recommended activity modification for today and tomorrow, then gradual resumption of normal activities. - Instructed to contact clinic for recurrent pain to arrange repeat injection as needed.  Type II Diabetes with long-term insulin  use - Has been stable with last A1c 6.1 - He will continue using his Lantus  26 units daily - Did describe transient glucose rise given CSI, he will check his BG accordingly and manage as indicated  Follow-up: Return if symptoms worsen or fail to improve.   Meds & Orders: No orders of the defined types were placed in this encounter.   Orders Placed This Encounter  Procedures   Large Joint Inj   US  Guided Needle Placement - No Linked Charges     Procedures: Large Joint Inj: R greater trochanter on 03/06/2024 9:04 AM Indications: pain Details: 22 G 3.5 in needle, ultrasound-guided anterior approach Medications: 4 mL lidocaine  1 %; 40 mg methylPREDNISolone  acetate 40 MG/ML Outcome: tolerated well, no immediate complications  Procedure: US -guided hip injection, Right Hip & GT After discussion on risks/benefits/indications and informed verbal consent was obtained, a timeout  was performed. Patient was lying supine on exam table. The hip was cleaned with chloraprep and alcohol swabs. Then utilizing ultrasound guidance, the patient's hip arthroplasty and acetablum was identified.  There is a small region of fluid collection, likely indicative of a pseudotumor which was  identified - not a large enough fluid collection to warrant aspiration.  This area and region of greater trochanteric bursa was subsequently injected with 2:2:1 lidocaine :bupivicaine:depomedrol via an in-plane approach with ultrasound visualization of the injectate administered.  Patient tolerated procedure well without immediate complications.  Procedure, treatment alternatives, risks and benefits explained, specific risks discussed. Consent was given by the patient. Immediately prior to procedure a time out was called to verify the correct patient, procedure, equipment, support staff and site/side marked as required. Patient was prepped and draped in the usual sterile fashion.          Clinical History: No specialty comments available.  He reports that he quit smoking about 65 years ago. His smoking use included cigarettes. He started smoking about 75 years ago. He has a 30 pack-year smoking history. He has never used smokeless tobacco.  Recent Labs    05/09/23 1005 10/23/23 0515  HGBA1C 7.9* 6.1*    Objective:    Physical Exam  Gen: Well-appearing, in no acute distress; non-toxic CV: Well-perfused. Warm.  Resp: Breathing unlabored on room air; no wheezing. Psych: Fluid speech in conversation; appropriate affect; normal thought process  *MSK/Ortho Exam: Physical Exam MUSCULOSKELETAL: Pain in right hip on palpation.  - R-hip: + TTP over the anterior lateral hip and the greater trochanteric region.  Well-healed vertical incision from previous THA, no signs or symptoms of infection or redness.  Imaging:  *Independent review and interpretation of right hip x-ray from 04/07/2023 was no hardware abnormality, hypoechoic change in the lateral hip, possibly a pelvic soft tissue collection.  Narrative & Impression  CLINICAL DATA:  89 year old male status post fall with pain.   EXAM: DG HIP (WITH OR WITHOUT PELVIS) 2-3V RIGHT   COMPARISON:  Right hip series 08/08/2020.    FINDINGS: Three views at 1054 hours. Chronic right hip bipolar arthroplasty bipolar arthroplasty. Hardware and alignment appears stable. Proximal right femur appears stable and intact.   No pelvis fracture identified. Left femoral head remains normally located. Grossly intact proximal left femur.   Diffuse severe calcified atherosclerosis. Nonobstructed bowel-gas pattern.   IMPRESSION: 1. No acute fracture or dislocation identified about the pelvis or right hip. 2. Stable chronic right hip bipolar arthroplasty. 3. Diffuse severe calcified atherosclerosis.     Electronically Signed   By: VEAR Hurst M.D.   On: 04/07/2023 11:42     Past Medical/Family/Surgical/Social History: Medications & Allergies reviewed per EMR, new medications updated. Patient Active Problem List   Diagnosis Date Noted   Atrial fibrillation with RVR (HCC) 02/01/2024   Sepsis due to pneumonia (HCC) 10/22/2023   Generalized weakness 10/22/2023   Permanent atrial fibrillation (HCC) 10/22/2023   Community acquired pneumonia of right lower lobe of lung 10/22/2023   Stage 3b chronic kidney disease (HCC) 05/09/2023   Sepsis without acute organ dysfunction (HCC) 01/10/2023   History of lower GI bleeding 06/10/2022   Hiatal hernia 05/05/2022   Osteoarthritis of metacarpophalangeal (MCP) joint of left thumb 01/18/2022   Hypokalemia 10/10/2021   Mild cognitive impairment 03/05/2021   Stable hemispheric central retinal vein occlusion (CRVO) of right eye (HCC) 07/21/2020   Moderate nonproliferative diabetic retinopathy of left eye (HCC) 06/18/2020   Thoracic aortic aneurysm without rupture  05/26/2020   CKD stage 3a, GFR 45-59 ml/min (HCC) 05/26/2020   Aortic stenosis, moderate 05/22/2019   Chronic heart failure with preserved ejection fraction (HCC) 03/14/2019   Lower extremity edema 03/14/2019   Spinal stenosis of lumbar region 02/21/2018   Degeneration of lumbar intervertebral disc 02/21/2018   Diabetic  peripheral neuropathy associated with type 2 diabetes mellitus (HCC) 05/05/2017   Atherosclerosis of native coronary artery of native heart without angina pectoris 06/13/2016   CVA (cerebral vascular accident) (HCC) 06/06/2016   Recurrent coronary arteriosclerosis after percutaneous transluminal coronary angioplasty 04/18/2015   Epistaxis, recurrent 04/11/2015   CAD S/P PCI- Nov 2016    Chronic vertigo 03/15/2014   Chronic anticoagulation 02/06/2013   Obesity (BMI 30.0-34.9) 01/31/2012   Osteoarthritis, knee 01/31/2012   Allergic rhinitis 10/28/2011   Chronic atrial fibrillation (HCC) 07/30/2011   Hearing loss 06/08/2011   Primary osteoarthritis of hand 06/08/2011   Enlarged prostate without lower urinary tract symptoms (luts) 01/04/2011   Gastro-esophageal reflux disease without esophagitis 12/07/2010   Hx of CABG x 2 1990    Hypertension associated with diabetes (HCC)    Diabetes mellitus with diabetic nephropathy, with long-term current use of insulin  (HCC)    Combined hyperlipidemia associated with type 2 diabetes mellitus (HCC)    Benign hematuria 08/27/2008   Past Medical History:  Diagnosis Date   Abdominal pain    Acute renal failure     resolved   Aortic stenosis    Arthritis    hands & legs (11/'10/2014)   Ataxia    Bladder outlet obstruction    BPH (benign prostatic hyperplasia)    CAD (coronary artery disease)    a. CABG IN 1989. b. 01/08/2015 CTO of ost LAD, LIMA to LAD not visualized but assumed patent given myoview  finding, occluded SVG to diagonal, 99% mid RCA tx w/ SYNERGY DES 3X28 mm   Chronic heart failure with preserved ejection fraction (HFpEF) (HCC)    Constipation    Diabetes mellitus type 2, insulin  dependent (HCC)    Epistaxis, recurrent 04/2015   GERD (gastroesophageal reflux disease)    HTN (hypertension)    Hx of bacterial pneumonia    Hyperlipemia    Kidney stones    Mild cognitive impairment 03/05/2021   MMSE 26/30 and 6CIT 9 03/2021 AWV    Permanent atrial fibrillation (HCC)    Pneumonia 04/2019   Rib fractures    left rib fractures being treated with pain medications   Thoracic aortic aneurysm    Urinary tract infection     Enterococcus   Family History  Problem Relation Age of Onset   Brain cancer Father    Throat cancer Brother    Liver disease Neg Hx    Colon cancer Neg Hx    Esophageal cancer Neg Hx    Past Surgical History:  Procedure Laterality Date   CARDIAC CATHETERIZATION  1989   CARDIAC CATHETERIZATION N/A 01/08/2015   Procedure: Left Heart Cath and Cors/Grafts Angiography;  Surgeon: Peter M Jordan, MD;  Location: [facility] INVASIVE CV LAB;  Service: Cardiovascular;  Laterality: N/A;   CARDIAC CATHETERIZATION  01/08/2015   Procedure: Coronary Stent Intervention;  Surgeon: Peter M Jordan, MD;  Location: [facility] INVASIVE CV LAB;  Service: Cardiovascular;;   CARPAL TUNNEL RELEASE Right 08/2010   CATARACT EXTRACTION W/ INTRAOCULAR LENS  IMPLANT, BILATERAL Bilateral    COLONOSCOPY WITH PROPOFOL  N/A 05/05/2022   Procedure: COLONOSCOPY WITH PROPOFOL ;  Surgeon: Abran Norleen SAILOR, MD;  Location: [facility] ENDOSCOPY;  Service:  Gastroenterology;  Laterality: N/A;   CORONARY ANGIOPLASTY  01/08/15   RCA DES   CORONARY ARTERY BYPASS GRAFT  1989   CABG X 2   ESOPHAGOGASTRODUODENOSCOPY (EGD) WITH PROPOFOL  N/A 05/05/2022   Procedure: ESOPHAGOGASTRODUODENOSCOPY (EGD) WITH PROPOFOL ;  Surgeon: Abran Norleen SAILOR, MD;  Location: [facility] ENDOSCOPY;  Service: Gastroenterology;  Laterality: N/A;   IR THORACENTESIS ASP PLEURAL SPACE W/IMG GUIDE  05/14/2019   JOINT REPLACEMENT     POLYPECTOMY  05/05/2022   Procedure: POLYPECTOMY;  Surgeon: Abran Norleen SAILOR, MD;  Location: [facility] ENDOS[facility];  Service: Gastroenterology;;   TONSILLECTOMY     TOTAL HIP ARTHROPLASTY Right 2000   Social History   Occupational History   Occupation: insurance underwriter   Occupation: retired  Tobacco Use   Smoking status: Former    Current packs/day: 0.00    Average packs/day: 3.0 packs/day for  10.0 years (30.0 ttl pk-yrs)    Types: Cigarettes    Start date: 05/10/1948    Quit date: 05/11/1958    Years since quitting: 65.8   Smokeless tobacco: Never  Vaping Use   Vaping status: Never Used  Substance and Sexual Activity   Alcohol use: No   Drug use: No   Sexual activity: Not Currently   "

## 2024-03-08 ENCOUNTER — Ambulatory Visit: Admitting: Family Medicine

## 2024-03-08 ENCOUNTER — Encounter: Payer: Self-pay | Admitting: Family Medicine

## 2024-03-08 VITALS — BP 111/65 | HR 94 | Temp 98.3°F | Ht 66.0 in | Wt 193.4 lb

## 2024-03-08 DIAGNOSIS — I152 Hypertension secondary to endocrine disorders: Secondary | ICD-10-CM

## 2024-03-08 DIAGNOSIS — Z7901 Long term (current) use of anticoagulants: Secondary | ICD-10-CM

## 2024-03-08 DIAGNOSIS — Z794 Long term (current) use of insulin: Secondary | ICD-10-CM

## 2024-03-08 DIAGNOSIS — R42 Dizziness and giddiness: Secondary | ICD-10-CM

## 2024-03-08 DIAGNOSIS — E1121 Type 2 diabetes mellitus with diabetic nephropathy: Secondary | ICD-10-CM | POA: Diagnosis not present

## 2024-03-08 DIAGNOSIS — I5032 Chronic diastolic (congestive) heart failure: Secondary | ICD-10-CM

## 2024-03-08 DIAGNOSIS — I482 Chronic atrial fibrillation, unspecified: Secondary | ICD-10-CM

## 2024-03-08 DIAGNOSIS — D509 Iron deficiency anemia, unspecified: Secondary | ICD-10-CM

## 2024-03-08 DIAGNOSIS — Z8719 Personal history of other diseases of the digestive system: Secondary | ICD-10-CM

## 2024-03-08 DIAGNOSIS — E1169 Type 2 diabetes mellitus with other specified complication: Secondary | ICD-10-CM | POA: Diagnosis not present

## 2024-03-08 DIAGNOSIS — E782 Mixed hyperlipidemia: Secondary | ICD-10-CM | POA: Diagnosis not present

## 2024-03-08 DIAGNOSIS — I110 Hypertensive heart disease with heart failure: Secondary | ICD-10-CM

## 2024-03-08 DIAGNOSIS — E1159 Type 2 diabetes mellitus with other circulatory complications: Secondary | ICD-10-CM

## 2024-03-08 LAB — COMPREHENSIVE METABOLIC PANEL WITH GFR
ALT: 13 U/L (ref 3–53)
AST: 18 U/L (ref 5–37)
Albumin: 4.1 g/dL (ref 3.5–5.2)
Alkaline Phosphatase: 58 U/L (ref 39–117)
BUN: 31 mg/dL — ABNORMAL HIGH (ref 6–23)
CO2: 29 meq/L (ref 19–32)
Calcium: 9.4 mg/dL (ref 8.4–10.5)
Chloride: 102 meq/L (ref 96–112)
Creatinine, Ser: 1.38 mg/dL (ref 0.40–1.50)
GFR: 43.99 mL/min — ABNORMAL LOW
Glucose, Bld: 273 mg/dL — ABNORMAL HIGH (ref 70–99)
Potassium: 4.7 meq/L (ref 3.5–5.1)
Sodium: 141 meq/L (ref 135–145)
Total Bilirubin: 0.7 mg/dL (ref 0.2–1.2)
Total Protein: 6.8 g/dL (ref 6.0–8.3)

## 2024-03-08 LAB — LIPID PANEL
Cholesterol: 107 mg/dL (ref 28–200)
HDL: 37.3 mg/dL — ABNORMAL LOW
LDL Cholesterol: 48 mg/dL (ref 10–99)
NonHDL: 69.97
Total CHOL/HDL Ratio: 3
Triglycerides: 110 mg/dL (ref 10.0–149.0)
VLDL: 22 mg/dL (ref 0.0–40.0)

## 2024-03-08 LAB — CBC WITH DIFFERENTIAL/PLATELET
Basophils Absolute: 0.1 K/uL (ref 0.0–0.1)
Basophils Relative: 0.9 % (ref 0.0–3.0)
Eosinophils Absolute: 0.2 K/uL (ref 0.0–0.7)
Eosinophils Relative: 1.2 % (ref 0.0–5.0)
HCT: 28.9 % — ABNORMAL LOW (ref 39.0–52.0)
Hemoglobin: 9 g/dL — ABNORMAL LOW (ref 13.0–17.0)
Lymphocytes Relative: 12.1 % (ref 12.0–46.0)
Lymphs Abs: 1.8 K/uL (ref 0.7–4.0)
MCHC: 31.1 g/dL (ref 30.0–36.0)
MCV: 74.2 fl — ABNORMAL LOW (ref 78.0–100.0)
Monocytes Absolute: 0.8 K/uL (ref 0.1–1.0)
Monocytes Relative: 5.4 % (ref 3.0–12.0)
Neutro Abs: 11.8 K/uL — ABNORMAL HIGH (ref 1.4–7.7)
Neutrophils Relative %: 80.4 % — ABNORMAL HIGH (ref 43.0–77.0)
Platelets: 357 K/uL (ref 150.0–400.0)
RBC: 3.89 Mil/uL — ABNORMAL LOW (ref 4.22–5.81)
RDW: 21.3 % — ABNORMAL HIGH (ref 11.5–15.5)
WBC: 14.7 K/uL — ABNORMAL HIGH (ref 4.0–10.5)

## 2024-03-08 LAB — HEMOGLOBIN A1C: Hgb A1c MFr Bld: 7.1 % — ABNORMAL HIGH (ref 4.6–6.5)

## 2024-03-08 MED ORDER — INSULIN GLARGINE 100 UNIT/ML ~~LOC~~ SOLN: 26.0000 [IU] | Freq: Every morning | SUBCUTANEOUS | Status: AC

## 2024-03-08 MED ORDER — MECLIZINE HCL 25 MG PO TABS: 12.5000 mg | ORAL_TABLET | Freq: Two times a day (BID) | ORAL | 5 refills | Status: AC | PRN

## 2024-03-09 LAB — IRON,TIBC AND FERRITIN PANEL
%SAT: 8 % — ABNORMAL LOW (ref 20–48)
Ferritin: 45 ng/mL (ref 24–380)
Iron: 27 ug/dL — ABNORMAL LOW (ref 50–180)
TIBC: 332 ug/dL (ref 250–425)

## 2024-03-13 ENCOUNTER — Ambulatory Visit: Payer: Self-pay | Admitting: Family Medicine

## 2024-03-13 NOTE — Progress Notes (Signed)
 Please call patient: Please let him know his anemia is stable but iron  remains low.  Please take the iron  tablets twice a day if he can tolerate that.  Sometimes it will cause constipation.  Diabetic control is fine.  Cholesterol is excellent.  No other changes are needed at this time.

## 2024-03-15 ENCOUNTER — Ambulatory Visit: Admitting: Orthopaedic Surgery

## 2024-03-18 NOTE — Progress Notes (Signed)
 "  Subjective  CC:  Chief Complaint  Patient presents with   Hypertension   Anemia   Congestive Heart Failure    HPI: [name] is a 89 y.o. male who presents to the office today to address the problems listed above in the chief complaint. Hypertension f/u:  Discussed the use of AI scribe software for clinical note transcription with the patient, who gave verbal consent to proceed.  History of Present Illness [name] is a 89 year old male who presents for follow-up regarding diabetes management and dizziness. He is accompanied by his wife.  Glycemic control - Fluctuating blood glucose levels with insulin  adjustments: 28 units causes hypoglycemia, 25 units results in hyperglycemia - Blood glucose readings range from 90 to 120 mg/dL, with occasional spikes over 200 mg/dL - Receives steroid injection every six months, which temporarily elevates blood glucose - Feels 'a little dopey' during hypoglycemic episodes  Dizziness and vertigo - Daily dizziness managed with meclizine , which is effective without causing drowsiness - No need for more than one tablet of meclizine  per day - No recent use of cane for ambulation  Appetite and energy level - Low energy levels and poor appetite - Recently able to eat three meals in one day for the first time in weeks - Decreased activity may contribute to reduced appetite  Vitamin supplementation - T[facility] vitamin C and [facility] D  [facility] - Uncertain about the source of some vitamins but finds them beneficial - Believes vitamins help prevent dizziness  Gastrointestinal and peripheral edema symptoms - No black tarry stools - No blood in stool - No leg swelling  Functional status and independence - Lives independently with his wife - Occasionally uses a cane - Drives his own car - Prefers to manage affairs without in-home care despite suggestions from family   Assessment  1. Chronic heart failure  with preserved ejection fraction (HCC)   2. Chronic atrial fibrillation (HCC)   3. Chronic anticoagulation   4. History of lower GI bleeding   5. Microcytic anemia   6. Combined hyperlipidemia associated with type 2 diabetes mellitus (HCC)   7. Type 2 diabetes mellitus with diabetic nephropathy, with long-term current use of insulin  (HCC)   8. Hypertension associated with diabetes (HCC)   9. Chronic vertigo      Plan  Assessment and Plan Assessment & Plan Type 2 diabetes mellitus with diabetic nephropathy Blood glucose levels fluctuate between 90s to 120s, with occasional spikes to 200s. Recent steroid injection may cause temporary hyperglycemia. Current insulin  regimen is 25-28 units, with adjustments needed due to steroid effects. - Adjust insulin  to 26 units to stabilize blood glucose levels. - Monitor blood glucose levels, especially post-steroid injection. - Educated on temporary hyperglycemia post-steroid injection and advised not to adjust insulin  during this period.  Chronic heart failure with preserved ejection fraction Heart function is well-managed with no reported dyspnea or edema.  Microcytic anemia Recent transfusion. Energy levels are low, and appetite is poor, possibly related to anemia. No melena or hematochezia reported. - Rechecked blood counts to ensure stability post-transfusion. - Encouraged adequate nutrition and hydration.  Chronic vertigo Managed with meclizine . Dizziness persists, but meclizine  is well-tolerated without causing somnolence. - Refilled meclizine  prescription. - Advised to take meclizine  twice daily if needed, monitoring for side effects.  General health maintenance Discussion on vitamin supplementation, including vitamin C and iron , to aid in iron  absorption. No adverse effects from vitamins reported. - Continue vitamin C  and iron  supplementation. - Educated on the importance of vitamin C in iron  absorption.  I spent a total of 50 minutes  for this patient encounter. Time spent included preparation, face-to-face counseling with the patient and coordination of care, review of chart and records, and documentation of the encounter.    Education regarding management of these chronic disease states was given. Management strategies discussed on successive visits include dietary and exercise recommendations, goals of achieving and maintaining IBW, and lifestyle modifications aiming for adequate sleep and minimizing stressors.  Follow up: 6-12 weeks  Orders Placed This Encounter  Procedures   Iron , TIBC and Ferritin Panel   CBC with Differential/Platelet   Hemoglobin A1c   Lipid panel   Comprehensive metabolic panel with GFR   Meds ordered this encounter  Medications   insulin  glargine (LANTUS ) 100 UNIT/ML injection    Sig: Inject 0.26 mLs (26 Units total) into the skin in the morning.   meclizine  (ANTIVERT ) 25 MG tablet    Sig: Take 0.5-1 tablets (12.5-25 mg total) by mouth 2 (two) times daily as needed for dizziness.    Dispense:  60 tablet    Refill:  5      BP Readings from Last 3 Encounters:  03/08/24 111/65  02/17/24 130/64  02/07/24 (!) 176/84   Wt Readings from Last 3 Encounters:  03/08/24 193 lb 6.4 oz (87.7 kg)  02/17/24 182 lb 9.6 oz (82.8 kg)  02/07/24 190 lb 6.4 oz (86.4 kg)    Lab Results  Component Value Date   CHOL 107 03/08/2024   CHOL 147 03/11/2022   CHOL 154 04/14/2021   Lab Results  Component Value Date   HDL 37.30 (L) 03/08/2024   HDL 52.30 03/11/2022   HDL 54.70 04/14/2021   Lab Results  Component Value Date   LDLCALC 48 03/08/2024   LDLCALC 80 03/11/2022   LDLCALC 75 04/14/2021   Lab Results  Component Value Date   TRIG 110.0 03/08/2024   TRIG 75.0 03/11/2022   TRIG 119.0 04/14/2021   Lab Results  Component Value Date   CHOLHDL 3 03/08/2024   CHOLHDL 3 03/11/2022   CHOLHDL 3 04/14/2021   No results found for: LDLDIRECT Lab Results  Component Value Date   CREATININE  1.38 03/08/2024   BUN 31 (H) 03/08/2024   NA 141 03/08/2024   K 4.7 03/08/2024   CL 102 03/08/2024   CO2 29 03/08/2024    The ASCVD Risk score (Arnett DK, et al., 2019) failed to calculate for the following reasons:   The 2019 ASCVD risk score is only valid for ages 8 to 42   Risk score cannot be calculated because patient has a medical history suggesting prior/existing ASCVD   * - Cholesterol units were assumed  I reviewed the patients updated PMH, FH, and SocHx.    Patient Active Problem List   Diagnosis Date Noted   History of lower GI bleeding 06/10/2022    Priority: High   Mild cognitive impairment 03/05/2021    Priority: High   Moderate nonproliferative diabetic retinopathy of left eye (HCC) 06/18/2020    Priority: High   CKD stage 3a, GFR 45-59 ml/min (HCC) 05/26/2020    Priority: High   Aortic stenosis, moderate 05/22/2019    Priority: High   Chronic heart failure with preserved ejection fraction (HCC) 03/14/2019    Priority: High   Spinal stenosis of lumbar region 02/21/2018    Priority: High   Diabetic peripheral neuropathy associated with type 2  diabetes mellitus (HCC) 05/05/2017    Priority: High   Atherosclerosis of native coronary artery of native heart without angina pectoris 06/13/2016    Priority: High   CVA (cerebral vascular accident) (HCC) 06/06/2016    Priority: High   Recurrent coronary arteriosclerosis after percutaneous transluminal coronary angioplasty 04/18/2015    Priority: High   CAD S/P PCI- Nov 2016     Priority: High   Chronic anticoagulation 02/06/2013    Priority: High   Chronic atrial fibrillation (HCC) 07/30/2011    Priority: High   Hx of CABG x 2 1990     Priority: High   Hypertension associated with diabetes (HCC)     Priority: High   Diabetes mellitus with diabetic nephropathy, with long-term current use of insulin  (HCC)     Priority: High   Combined hyperlipidemia associated with type 2 diabetes mellitus (HCC)     Priority:  High   Thoracic aortic aneurysm without rupture 05/26/2020    Priority: Medium    Degeneration of lumbar intervertebral disc 02/21/2018    Priority: Medium    Chronic vertigo 03/15/2014    Priority: Medium    Obesity (BMI 30.0-34.9) 01/31/2012    Priority: Medium    Enlarged prostate without lower urinary tract symptoms (luts) 01/04/2011    Priority: Medium    Gastro-esophageal reflux disease without esophagitis 12/07/2010    Priority: Medium    Hiatal hernia 05/05/2022    Priority: Low   Osteoarthritis of metacarpophalangeal (MCP) joint of left thumb 01/18/2022    Priority: Low   Stable hemispheric central retinal vein occlusion (CRVO) of right eye (HCC) 07/21/2020    Priority: Low   Lower extremity edema 03/14/2019    Priority: Low   Epistaxis, recurrent 04/11/2015    Priority: Low   Osteoarthritis, knee 01/31/2012    Priority: Low   Allergic rhinitis 10/28/2011    Priority: Low   Hearing loss 06/08/2011    Priority: Low   Primary osteoarthritis of hand 06/08/2011    Priority: Low   Benign hematuria 08/27/2008    Priority: Low   Atrial fibrillation with RVR (HCC) 02/01/2024   Sepsis due to pneumonia (HCC) 10/22/2023   Generalized weakness 10/22/2023   Permanent atrial fibrillation (HCC) 10/22/2023   Community acquired pneumonia of right lower lobe of lung 10/22/2023   Stage 3b chronic kidney disease (HCC) 05/09/2023   Sepsis without acute organ dysfunction (HCC) 01/10/2023   Hypokalemia 10/10/2021    Allergies: Cefadroxil, Ciprofloxacin, Erythromycin, and Septra  [sulfamethoxazole -trimethoprim ]  Social History: Patient  reports that he quit smoking about 65 years ago. His smoking use included cigarettes. He started smoking about 75 years ago. He has a 30 pack-year smoking history. He has never used smokeless tobacco. He reports that he does not drink alcohol and does not use drugs.  Active Medications[1]  Review of Systems: Cardiovascular: negative for chest pain,  palpitations, leg swelling, orthopnea Respiratory: negative for SOB, wheezing or persistent cough Gastrointestinal: negative for abdominal pain Genitourinary: negative for dysuria or gross hematuria  Objective  Vitals: BP 111/65   Pulse 94 Comment: irreg irreg  Temp 98.3 F (36.8 C)   Ht 5' 6 (1.676 m)   Wt 193 lb 6.4 oz (87.7 kg)   SpO2 92%   BMI 31.22 kg/m  General: no acute distress  Psych:  Alert and oriented, normal mood and affect HEENT:  Normocephalic, atraumatic, supple neck  Cardiovascular:  irreg irreg + murmur Respiratory:  Good breath sounds bilaterally, CTAB with normal respiratory  effort Ext: no edema  Commons side effects, risks, benefits, and alternatives for medications and treatment plan prescribed today were discussed, and the patient expressed understanding of the given instructions. Patient is instructed to call or message via MyChart if he/she has any questions or concerns regarding our treatment plan. No barriers to understanding were identified. We discussed Red Flag symptoms and signs in detail. Patient expressed understanding regarding what to do in case of urgent or emergency type symptoms.  Medication list was reconciled, printed and provided to the patient in AVS. Patient instructions and summary information was reviewed with the patient as documented in the AVS. This note was prepared with assistance of Dragon voice recognition software. Occasional wrong-word or sound-a-like substitutions may have occurred due to the inherent limitation    [1]  Current Meds  Medication Sig   albuterol  (VENTOLIN  HFA) 108 (90 Base) MCG/ACT inhaler Inhale 2 puffs into the lungs every 6 (six) hours as needed for wheezing or shortness of breath.   apixaban  (ELIQUIS ) 5 MG TABS tablet Take 1 tablet (5 mg total) by mouth 2 (two) times daily.   atorvastatin  (LIPITOR) 40 MG tablet Take 1 tablet (40 mg total) by mouth at bedtime.   diltiazem  (CARDIZEM  CD) 120 MG 24 hr capsule TAKE  1 CAPSULE(120 MG) BY MOUTH DAILY   dutasteride  (AVODART ) 0.5 MG capsule Take 1 capsule (0.5 mg total) by mouth daily.   furosemide  (LASIX ) 40 MG tablet TAKE 1 TABLET(40 MG) BY MOUTH DAILY   gabapentin  (NEURONTIN ) 300 MG capsule Take 2 capsules (600 mg total) by mouth at bedtime.   meloxicam  (MOBIC ) 7.5 MG tablet Take 1 tablet (7.5 mg total) by mouth daily as needed. (Patient taking differently: Take 7.5 mg by mouth daily as needed for pain.)   metFORMIN  (GLUCOPHAGE ) 1000 MG tablet Take 1 tablet (1,000 mg total) by mouth 2 (two) times daily with a meal.   metoprolol  tartrate (LOPRESSOR ) 50 MG tablet Take 50 mg by mouth 2 (two) times daily.   pantoprazole  (PROTONIX ) 40 MG tablet TAKE 1 TABLET BY MOUTH DAILY AT NOON   tamsulosin  (FLOMAX ) 0.4 MG CAPS capsule Take 1 capsule (0.4 mg total) by mouth daily.   [DISCONTINUED] insulin  glargine (LANTUS ) 100 UNIT/ML injection Inject 0.28 mLs (28 Units total) into the skin every morning. (Patient taking differently: Inject 26 Units into the skin in the morning.)   [DISCONTINUED] meclizine  (ANTIVERT ) 25 MG tablet Take 0.5-1 tablets (12.5-25 mg total) by mouth 2 (two) times daily as needed for dizziness.   "

## 2024-03-20 ENCOUNTER — Encounter: Payer: Self-pay | Admitting: Cardiology

## 2024-04-05 ENCOUNTER — Other Ambulatory Visit: Payer: Self-pay | Admitting: Family Medicine

## 2024-04-19 ENCOUNTER — Ambulatory Visit: Payer: Medicare Other

## 2024-04-19 ENCOUNTER — Ambulatory Visit: Admitting: Family Medicine

## 2024-05-10 ENCOUNTER — Ambulatory Visit: Admitting: Podiatry

## 2024-05-21 ENCOUNTER — Ambulatory Visit: Admitting: Cardiology

## 2024-05-24 ENCOUNTER — Ambulatory Visit: Admitting: Cardiology
